# Patient Record
Sex: Male | Born: 1965
Health system: Southern US, Community
[De-identification: ages and names within clinical notes are randomized; demographics above are authoritative.]

## PROBLEM LIST (undated history)

## (undated) ENCOUNTER — Emergency Department (HOSPITAL_COMMUNITY): Admission: EM | Payer: Medicare Other

## (undated) DIAGNOSIS — I428 Other cardiomyopathies: Secondary | ICD-10-CM

## (undated) DIAGNOSIS — I472 Ventricular tachycardia, unspecified: Secondary | ICD-10-CM

## (undated) DIAGNOSIS — K759 Inflammatory liver disease, unspecified: Secondary | ICD-10-CM

## (undated) DIAGNOSIS — Z950 Presence of cardiac pacemaker: Secondary | ICD-10-CM

## (undated) DIAGNOSIS — K219 Gastro-esophageal reflux disease without esophagitis: Secondary | ICD-10-CM

## (undated) DIAGNOSIS — D75A Glucose-6-phosphate dehydrogenase (G6PD) deficiency without anemia: Secondary | ICD-10-CM

## (undated) DIAGNOSIS — J189 Pneumonia, unspecified organism: Secondary | ICD-10-CM

## (undated) DIAGNOSIS — F329 Major depressive disorder, single episode, unspecified: Secondary | ICD-10-CM

## (undated) DIAGNOSIS — Z21 Asymptomatic human immunodeficiency virus [HIV] infection status: Secondary | ICD-10-CM

## (undated) DIAGNOSIS — I5022 Chronic systolic (congestive) heart failure: Secondary | ICD-10-CM

## (undated) DIAGNOSIS — F32A Depression, unspecified: Secondary | ICD-10-CM

## (undated) DIAGNOSIS — Z95810 Presence of automatic (implantable) cardiac defibrillator: Secondary | ICD-10-CM

## (undated) DIAGNOSIS — I10 Essential (primary) hypertension: Secondary | ICD-10-CM

## (undated) DIAGNOSIS — I509 Heart failure, unspecified: Secondary | ICD-10-CM

## (undated) DIAGNOSIS — B20 Human immunodeficiency virus [HIV] disease: Secondary | ICD-10-CM

## (undated) HISTORY — DX: Major depressive disorder, single episode, unspecified: F32.9

## (undated) HISTORY — DX: Asymptomatic human immunodeficiency virus (hiv) infection status: Z21

## (undated) HISTORY — DX: Ventricular tachycardia, unspecified: I47.20

## (undated) HISTORY — DX: Ventricular tachycardia: I47.2

## (undated) HISTORY — DX: Essential (primary) hypertension: I10

## (undated) HISTORY — DX: Chronic systolic (congestive) heart failure: I50.22

## (undated) HISTORY — DX: Human immunodeficiency virus (HIV) disease: B20

## (undated) HISTORY — DX: Other cardiomyopathies: I42.8

## (undated) HISTORY — PX: FINGER SURGERY: SHX640

## (undated) HISTORY — DX: Depression, unspecified: F32.A

---

## 1997-05-01 ENCOUNTER — Encounter (INDEPENDENT_AMBULATORY_CARE_PROVIDER_SITE_OTHER): Payer: Self-pay | Admitting: *Deleted

## 1997-05-01 LAB — CONVERTED CEMR LAB
CD4 Count: 120 microliters
CD4 T Cell Abs: 120

## 1997-11-15 ENCOUNTER — Encounter: Admission: RE | Admit: 1997-11-15 | Discharge: 1997-11-15 | Payer: Self-pay | Admitting: Internal Medicine

## 1998-02-20 ENCOUNTER — Encounter: Admission: RE | Admit: 1998-02-20 | Discharge: 1998-02-20 | Payer: Self-pay | Admitting: Internal Medicine

## 1998-02-25 ENCOUNTER — Encounter: Payer: Self-pay | Admitting: Internal Medicine

## 1998-02-25 ENCOUNTER — Encounter: Admission: RE | Admit: 1998-02-25 | Discharge: 1998-02-25 | Payer: Self-pay | Admitting: Internal Medicine

## 1998-04-21 ENCOUNTER — Emergency Department (HOSPITAL_COMMUNITY): Admission: EM | Admit: 1998-04-21 | Discharge: 1998-04-21 | Payer: Self-pay | Admitting: Emergency Medicine

## 1998-04-21 ENCOUNTER — Encounter: Payer: Self-pay | Admitting: Emergency Medicine

## 1998-05-07 ENCOUNTER — Encounter: Admission: RE | Admit: 1998-05-07 | Discharge: 1998-05-07 | Payer: Self-pay | Admitting: Internal Medicine

## 1998-07-10 ENCOUNTER — Emergency Department (HOSPITAL_COMMUNITY): Admission: EM | Admit: 1998-07-10 | Discharge: 1998-07-10 | Payer: Self-pay | Admitting: Emergency Medicine

## 1998-08-20 ENCOUNTER — Ambulatory Visit (HOSPITAL_COMMUNITY): Admission: RE | Admit: 1998-08-20 | Discharge: 1998-08-20 | Payer: Self-pay | Admitting: Internal Medicine

## 1998-08-20 ENCOUNTER — Encounter: Payer: Self-pay | Admitting: Internal Medicine

## 1998-08-20 ENCOUNTER — Encounter: Admission: RE | Admit: 1998-08-20 | Discharge: 1998-08-20 | Payer: Self-pay | Admitting: Internal Medicine

## 1998-09-17 ENCOUNTER — Encounter: Admission: RE | Admit: 1998-09-17 | Discharge: 1998-09-17 | Payer: Self-pay | Admitting: Internal Medicine

## 1998-10-16 ENCOUNTER — Ambulatory Visit (HOSPITAL_COMMUNITY): Admission: RE | Admit: 1998-10-16 | Discharge: 1998-10-16 | Payer: Self-pay | Admitting: Internal Medicine

## 1998-10-16 ENCOUNTER — Encounter: Admission: RE | Admit: 1998-10-16 | Discharge: 1998-10-16 | Payer: Self-pay

## 1998-12-19 ENCOUNTER — Encounter: Admission: RE | Admit: 1998-12-19 | Discharge: 1998-12-19 | Payer: Self-pay | Admitting: Hematology and Oncology

## 1999-02-18 ENCOUNTER — Ambulatory Visit (HOSPITAL_COMMUNITY): Admission: RE | Admit: 1999-02-18 | Discharge: 1999-02-18 | Payer: Self-pay | Admitting: Hematology and Oncology

## 1999-02-18 ENCOUNTER — Encounter: Admission: RE | Admit: 1999-02-18 | Discharge: 1999-02-18 | Payer: Self-pay | Admitting: Hematology and Oncology

## 1999-03-07 ENCOUNTER — Encounter: Admission: RE | Admit: 1999-03-07 | Discharge: 1999-03-07 | Payer: Self-pay | Admitting: Internal Medicine

## 1999-04-17 ENCOUNTER — Encounter: Admission: RE | Admit: 1999-04-17 | Discharge: 1999-04-17 | Payer: Self-pay | Admitting: Hematology and Oncology

## 1999-05-08 ENCOUNTER — Ambulatory Visit (HOSPITAL_COMMUNITY): Admission: RE | Admit: 1999-05-08 | Discharge: 1999-05-08 | Payer: Self-pay | Admitting: Internal Medicine

## 1999-05-08 ENCOUNTER — Encounter: Admission: RE | Admit: 1999-05-08 | Discharge: 1999-05-08 | Payer: Self-pay | Admitting: Internal Medicine

## 1999-06-12 ENCOUNTER — Encounter: Admission: RE | Admit: 1999-06-12 | Discharge: 1999-06-12 | Payer: Self-pay | Admitting: Internal Medicine

## 1999-08-21 ENCOUNTER — Inpatient Hospital Stay (HOSPITAL_COMMUNITY): Admission: RE | Admit: 1999-08-21 | Discharge: 1999-08-25 | Payer: Self-pay | Admitting: *Deleted

## 1999-09-01 ENCOUNTER — Encounter: Admission: RE | Admit: 1999-09-01 | Discharge: 1999-09-01 | Payer: Self-pay | Admitting: Internal Medicine

## 1999-09-01 ENCOUNTER — Ambulatory Visit (HOSPITAL_COMMUNITY): Admission: RE | Admit: 1999-09-01 | Discharge: 1999-09-01 | Payer: Self-pay | Admitting: Internal Medicine

## 1999-09-15 ENCOUNTER — Encounter: Admission: RE | Admit: 1999-09-15 | Discharge: 1999-09-15 | Payer: Self-pay | Admitting: Internal Medicine

## 1999-09-30 ENCOUNTER — Ambulatory Visit (HOSPITAL_COMMUNITY): Admission: RE | Admit: 1999-09-30 | Discharge: 1999-09-30 | Payer: Self-pay | Admitting: *Deleted

## 1999-09-30 ENCOUNTER — Encounter: Admission: RE | Admit: 1999-09-30 | Discharge: 1999-09-30 | Payer: Self-pay | Admitting: Hematology and Oncology

## 1999-10-30 ENCOUNTER — Encounter: Admission: RE | Admit: 1999-10-30 | Discharge: 1999-10-30 | Payer: Self-pay | Admitting: Internal Medicine

## 1999-12-22 ENCOUNTER — Encounter: Admission: RE | Admit: 1999-12-22 | Discharge: 1999-12-22 | Payer: Self-pay | Admitting: Internal Medicine

## 2000-03-22 ENCOUNTER — Ambulatory Visit (HOSPITAL_COMMUNITY): Admission: RE | Admit: 2000-03-22 | Discharge: 2000-03-22 | Payer: Self-pay | Admitting: Internal Medicine

## 2000-03-22 ENCOUNTER — Encounter: Admission: RE | Admit: 2000-03-22 | Discharge: 2000-03-22 | Payer: Self-pay | Admitting: Internal Medicine

## 2000-04-28 ENCOUNTER — Encounter: Admission: RE | Admit: 2000-04-28 | Discharge: 2000-04-28 | Payer: Self-pay | Admitting: Infectious Diseases

## 2000-05-19 ENCOUNTER — Encounter: Payer: Self-pay | Admitting: Infectious Diseases

## 2000-05-19 ENCOUNTER — Ambulatory Visit (HOSPITAL_COMMUNITY): Admission: RE | Admit: 2000-05-19 | Discharge: 2000-05-19 | Payer: Self-pay | Admitting: Infectious Diseases

## 2000-05-19 ENCOUNTER — Encounter: Admission: RE | Admit: 2000-05-19 | Discharge: 2000-05-19 | Payer: Self-pay | Admitting: Infectious Diseases

## 2000-05-20 ENCOUNTER — Encounter: Admission: RE | Admit: 2000-05-20 | Discharge: 2000-05-20 | Payer: Self-pay | Admitting: Infectious Diseases

## 2000-06-09 ENCOUNTER — Encounter: Admission: RE | Admit: 2000-06-09 | Discharge: 2000-06-09 | Payer: Self-pay | Admitting: Infectious Diseases

## 2000-06-09 ENCOUNTER — Ambulatory Visit (HOSPITAL_COMMUNITY): Admission: RE | Admit: 2000-06-09 | Discharge: 2000-06-09 | Payer: Self-pay | Admitting: Infectious Diseases

## 2000-06-23 ENCOUNTER — Ambulatory Visit (HOSPITAL_COMMUNITY): Admission: RE | Admit: 2000-06-23 | Discharge: 2000-06-23 | Payer: Self-pay

## 2000-06-23 ENCOUNTER — Encounter: Admission: RE | Admit: 2000-06-23 | Discharge: 2000-06-23 | Payer: Self-pay

## 2000-06-24 ENCOUNTER — Ambulatory Visit (HOSPITAL_COMMUNITY): Admission: RE | Admit: 2000-06-24 | Discharge: 2000-06-24 | Payer: Self-pay

## 2000-08-25 ENCOUNTER — Ambulatory Visit (HOSPITAL_COMMUNITY): Admission: RE | Admit: 2000-08-25 | Discharge: 2000-08-25 | Payer: Self-pay | Admitting: Infectious Diseases

## 2000-08-25 ENCOUNTER — Encounter: Admission: RE | Admit: 2000-08-25 | Discharge: 2000-08-25 | Payer: Self-pay | Admitting: Infectious Diseases

## 2000-09-29 ENCOUNTER — Encounter: Admission: RE | Admit: 2000-09-29 | Discharge: 2000-09-29 | Payer: Self-pay | Admitting: Infectious Diseases

## 2001-01-21 ENCOUNTER — Encounter: Admission: RE | Admit: 2001-01-21 | Discharge: 2001-01-21 | Payer: Self-pay | Admitting: Infectious Diseases

## 2001-01-25 ENCOUNTER — Encounter: Admission: RE | Admit: 2001-01-25 | Discharge: 2001-01-25 | Payer: Self-pay | Admitting: Infectious Diseases

## 2001-01-25 ENCOUNTER — Ambulatory Visit (HOSPITAL_COMMUNITY): Admission: RE | Admit: 2001-01-25 | Discharge: 2001-01-25 | Payer: Self-pay | Admitting: Infectious Diseases

## 2001-02-09 ENCOUNTER — Encounter: Admission: RE | Admit: 2001-02-09 | Discharge: 2001-02-09 | Payer: Self-pay | Admitting: Infectious Diseases

## 2001-03-14 ENCOUNTER — Encounter: Admission: RE | Admit: 2001-03-14 | Discharge: 2001-03-14 | Payer: Self-pay | Admitting: Internal Medicine

## 2001-04-18 ENCOUNTER — Encounter: Admission: RE | Admit: 2001-04-18 | Discharge: 2001-04-18 | Payer: Self-pay | Admitting: Infectious Diseases

## 2001-05-25 ENCOUNTER — Encounter: Payer: Self-pay | Admitting: *Deleted

## 2001-05-26 ENCOUNTER — Inpatient Hospital Stay (HOSPITAL_COMMUNITY): Admission: EM | Admit: 2001-05-26 | Discharge: 2001-05-28 | Payer: Self-pay | Admitting: *Deleted

## 2001-06-28 ENCOUNTER — Ambulatory Visit (HOSPITAL_COMMUNITY): Admission: RE | Admit: 2001-06-28 | Discharge: 2001-06-28 | Payer: Self-pay | Admitting: Infectious Diseases

## 2001-06-28 ENCOUNTER — Encounter: Admission: RE | Admit: 2001-06-28 | Discharge: 2001-06-28 | Payer: Self-pay | Admitting: Infectious Diseases

## 2001-09-07 ENCOUNTER — Encounter: Admission: RE | Admit: 2001-09-07 | Discharge: 2001-09-07 | Payer: Self-pay | Admitting: Infectious Diseases

## 2002-01-13 ENCOUNTER — Ambulatory Visit (HOSPITAL_COMMUNITY): Admission: RE | Admit: 2002-01-13 | Discharge: 2002-01-13 | Payer: Self-pay | Admitting: Infectious Diseases

## 2002-01-13 ENCOUNTER — Encounter: Admission: RE | Admit: 2002-01-13 | Discharge: 2002-01-13 | Payer: Self-pay | Admitting: Infectious Diseases

## 2002-01-17 ENCOUNTER — Ambulatory Visit (HOSPITAL_COMMUNITY): Admission: RE | Admit: 2002-01-17 | Discharge: 2002-01-17 | Payer: Self-pay | Admitting: Infectious Diseases

## 2002-01-17 ENCOUNTER — Encounter: Payer: Self-pay | Admitting: Infectious Diseases

## 2002-02-02 ENCOUNTER — Encounter: Admission: RE | Admit: 2002-02-02 | Discharge: 2002-02-02 | Payer: Self-pay | Admitting: Internal Medicine

## 2002-03-07 ENCOUNTER — Encounter: Admission: RE | Admit: 2002-03-07 | Discharge: 2002-03-07 | Payer: Self-pay | Admitting: Infectious Diseases

## 2002-03-29 ENCOUNTER — Encounter: Admission: RE | Admit: 2002-03-29 | Discharge: 2002-03-29 | Payer: Self-pay | Admitting: Infectious Diseases

## 2002-04-10 ENCOUNTER — Encounter: Admission: RE | Admit: 2002-04-10 | Discharge: 2002-04-10 | Payer: Self-pay | Admitting: Infectious Diseases

## 2002-08-22 ENCOUNTER — Encounter: Admission: RE | Admit: 2002-08-22 | Discharge: 2002-08-22 | Payer: Self-pay | Admitting: Internal Medicine

## 2002-08-24 ENCOUNTER — Encounter: Admission: RE | Admit: 2002-08-24 | Discharge: 2002-08-24 | Payer: Self-pay | Admitting: Infectious Diseases

## 2002-09-20 ENCOUNTER — Encounter: Admission: RE | Admit: 2002-09-20 | Discharge: 2002-09-20 | Payer: Self-pay | Admitting: Infectious Diseases

## 2003-03-14 ENCOUNTER — Encounter: Admission: RE | Admit: 2003-03-14 | Discharge: 2003-03-14 | Payer: Self-pay | Admitting: Infectious Diseases

## 2003-03-14 ENCOUNTER — Encounter: Payer: Self-pay | Admitting: Infectious Diseases

## 2003-03-14 ENCOUNTER — Ambulatory Visit (HOSPITAL_COMMUNITY): Admission: RE | Admit: 2003-03-14 | Discharge: 2003-03-14 | Payer: Self-pay | Admitting: Infectious Diseases

## 2003-03-28 ENCOUNTER — Encounter: Admission: RE | Admit: 2003-03-28 | Discharge: 2003-03-28 | Payer: Self-pay | Admitting: Infectious Diseases

## 2003-07-06 ENCOUNTER — Inpatient Hospital Stay (HOSPITAL_COMMUNITY): Admission: EM | Admit: 2003-07-06 | Discharge: 2003-07-07 | Payer: Self-pay | Admitting: Emergency Medicine

## 2003-07-06 ENCOUNTER — Encounter: Payer: Self-pay | Admitting: Cardiology

## 2003-07-11 ENCOUNTER — Encounter: Admission: RE | Admit: 2003-07-11 | Discharge: 2003-07-11 | Payer: Self-pay | Admitting: Infectious Diseases

## 2003-07-23 ENCOUNTER — Ambulatory Visit (HOSPITAL_COMMUNITY): Admission: RE | Admit: 2003-07-23 | Discharge: 2003-07-23 | Payer: Self-pay | Admitting: Infectious Diseases

## 2003-11-28 ENCOUNTER — Ambulatory Visit (HOSPITAL_COMMUNITY): Admission: RE | Admit: 2003-11-28 | Discharge: 2003-11-28 | Payer: Self-pay | Admitting: Infectious Diseases

## 2003-11-28 ENCOUNTER — Encounter: Admission: RE | Admit: 2003-11-28 | Discharge: 2003-11-28 | Payer: Self-pay | Admitting: Infectious Diseases

## 2003-12-19 ENCOUNTER — Encounter: Admission: RE | Admit: 2003-12-19 | Discharge: 2003-12-19 | Payer: Self-pay | Admitting: Infectious Diseases

## 2004-07-16 ENCOUNTER — Ambulatory Visit: Payer: Self-pay | Admitting: Infectious Diseases

## 2004-07-16 ENCOUNTER — Ambulatory Visit (HOSPITAL_COMMUNITY): Admission: RE | Admit: 2004-07-16 | Discharge: 2004-07-16 | Payer: Self-pay | Admitting: Infectious Diseases

## 2004-08-27 ENCOUNTER — Ambulatory Visit: Payer: Self-pay | Admitting: Infectious Diseases

## 2004-10-15 ENCOUNTER — Ambulatory Visit: Payer: Self-pay | Admitting: Infectious Diseases

## 2004-10-15 ENCOUNTER — Ambulatory Visit (HOSPITAL_COMMUNITY): Admission: RE | Admit: 2004-10-15 | Discharge: 2004-10-15 | Payer: Self-pay | Admitting: Infectious Diseases

## 2004-10-29 ENCOUNTER — Ambulatory Visit: Payer: Self-pay | Admitting: Infectious Diseases

## 2005-01-07 ENCOUNTER — Encounter (INDEPENDENT_AMBULATORY_CARE_PROVIDER_SITE_OTHER): Payer: Self-pay | Admitting: *Deleted

## 2005-01-07 ENCOUNTER — Ambulatory Visit (HOSPITAL_COMMUNITY): Admission: RE | Admit: 2005-01-07 | Discharge: 2005-01-07 | Payer: Self-pay | Admitting: Infectious Diseases

## 2005-01-07 ENCOUNTER — Ambulatory Visit: Payer: Self-pay | Admitting: Infectious Diseases

## 2005-01-07 LAB — CONVERTED CEMR LAB
CD4 Count: 220 microliters
HIV 1 RNA Quant: 161000 copies/mL

## 2005-09-16 ENCOUNTER — Ambulatory Visit: Payer: Self-pay | Admitting: Infectious Diseases

## 2005-09-16 ENCOUNTER — Encounter (INDEPENDENT_AMBULATORY_CARE_PROVIDER_SITE_OTHER): Payer: Self-pay | Admitting: *Deleted

## 2005-09-16 ENCOUNTER — Encounter: Admission: RE | Admit: 2005-09-16 | Discharge: 2005-09-16 | Payer: Self-pay | Admitting: Infectious Diseases

## 2005-09-16 LAB — CONVERTED CEMR LAB
CD4 Count: 230 microliters
HIV 1 RNA Quant: 46600 copies/mL

## 2006-01-18 ENCOUNTER — Ambulatory Visit: Payer: Self-pay | Admitting: Infectious Diseases

## 2006-01-18 ENCOUNTER — Encounter (INDEPENDENT_AMBULATORY_CARE_PROVIDER_SITE_OTHER): Payer: Self-pay | Admitting: *Deleted

## 2006-01-18 ENCOUNTER — Encounter: Admission: RE | Admit: 2006-01-18 | Discharge: 2006-01-18 | Payer: Self-pay | Admitting: Infectious Diseases

## 2006-01-18 LAB — CONVERTED CEMR LAB
CD4 Count: 180 microliters
HIV 1 RNA Quant: 4360 copies/mL

## 2006-02-03 ENCOUNTER — Ambulatory Visit: Payer: Self-pay | Admitting: Internal Medicine

## 2006-02-17 ENCOUNTER — Ambulatory Visit (HOSPITAL_COMMUNITY): Admission: RE | Admit: 2006-02-17 | Discharge: 2006-02-17 | Payer: Self-pay | Admitting: Hospitalist

## 2006-02-17 ENCOUNTER — Ambulatory Visit: Payer: Self-pay | Admitting: Hospitalist

## 2006-02-17 ENCOUNTER — Encounter: Payer: Self-pay | Admitting: Infectious Diseases

## 2006-06-02 ENCOUNTER — Encounter: Payer: Self-pay | Admitting: Infectious Diseases

## 2006-06-02 ENCOUNTER — Ambulatory Visit: Payer: Self-pay | Admitting: Internal Medicine

## 2006-06-09 DIAGNOSIS — B182 Chronic viral hepatitis C: Secondary | ICD-10-CM | POA: Insufficient documentation

## 2006-06-09 DIAGNOSIS — I10 Essential (primary) hypertension: Secondary | ICD-10-CM | POA: Insufficient documentation

## 2006-06-09 DIAGNOSIS — B59 Pneumocystosis: Secondary | ICD-10-CM | POA: Insufficient documentation

## 2006-06-09 DIAGNOSIS — F1910 Other psychoactive substance abuse, uncomplicated: Secondary | ICD-10-CM | POA: Insufficient documentation

## 2006-06-09 DIAGNOSIS — B20 Human immunodeficiency virus [HIV] disease: Secondary | ICD-10-CM | POA: Insufficient documentation

## 2006-06-09 DIAGNOSIS — A6000 Herpesviral infection of urogenital system, unspecified: Secondary | ICD-10-CM | POA: Insufficient documentation

## 2006-06-09 DIAGNOSIS — Z8619 Personal history of other infectious and parasitic diseases: Secondary | ICD-10-CM | POA: Insufficient documentation

## 2006-06-24 DIAGNOSIS — K625 Hemorrhage of anus and rectum: Secondary | ICD-10-CM | POA: Insufficient documentation

## 2006-06-28 ENCOUNTER — Encounter (INDEPENDENT_AMBULATORY_CARE_PROVIDER_SITE_OTHER): Payer: Self-pay | Admitting: Internal Medicine

## 2006-06-28 ENCOUNTER — Ambulatory Visit: Payer: Self-pay | Admitting: Internal Medicine

## 2006-06-28 ENCOUNTER — Telehealth (INDEPENDENT_AMBULATORY_CARE_PROVIDER_SITE_OTHER): Payer: Self-pay | Admitting: *Deleted

## 2006-06-28 LAB — CONVERTED CEMR LAB
HCT: 41.2 % (ref 39.0–52.0)
Hemoglobin: 14.1 g/dL (ref 13.0–17.0)
INR: 0.6
MCHC: 34.2 g/dL (ref 30.0–36.0)
MCV: 84.9 fL (ref 78.0–100.0)
OCCULT 1: POSITIVE
Platelets: 273 10*3/uL (ref 150–400)
RBC: 4.85 M/uL (ref 4.22–5.81)
RDW: 12.3 % (ref 11.5–14.0)
WBC: 6.1 10*3/uL (ref 4.0–10.5)

## 2006-07-26 ENCOUNTER — Encounter: Payer: Self-pay | Admitting: Internal Medicine

## 2006-07-26 ENCOUNTER — Encounter (INDEPENDENT_AMBULATORY_CARE_PROVIDER_SITE_OTHER): Payer: Self-pay | Admitting: *Deleted

## 2006-07-26 ENCOUNTER — Emergency Department (HOSPITAL_COMMUNITY): Admission: EM | Admit: 2006-07-26 | Discharge: 2006-07-26 | Payer: Self-pay | Admitting: Emergency Medicine

## 2006-07-26 LAB — CONVERTED CEMR LAB: HCV Quantitative: 250000 intl units/mL

## 2006-07-28 ENCOUNTER — Telehealth: Payer: Self-pay | Admitting: Infectious Diseases

## 2006-07-30 ENCOUNTER — Encounter: Payer: Self-pay | Admitting: Infectious Diseases

## 2006-07-30 ENCOUNTER — Encounter: Admission: RE | Admit: 2006-07-30 | Discharge: 2006-07-30 | Payer: Self-pay | Admitting: Internal Medicine

## 2006-07-30 ENCOUNTER — Ambulatory Visit: Payer: Self-pay | Admitting: Internal Medicine

## 2006-07-30 LAB — CONVERTED CEMR LAB
ALT: 39 units/L (ref 0–53)
AST: 43 units/L — ABNORMAL HIGH (ref 0–37)
Albumin: 4.4 g/dL (ref 3.5–5.2)
Alkaline Phosphatase: 104 units/L (ref 39–117)
BUN: 12 mg/dL (ref 6–23)
Basophils Absolute: 0 10*3/uL (ref 0.0–0.1)
Basophils Relative: 1 % (ref 0–1)
CD4 % Helper T Cell: 13 %
CD4 % Helper T Cell: 13 %
CD4 Count: 330 microliters
CD4 Count: 330 microliters
CO2: 28 meq/L (ref 19–32)
Calcium: 9.3 mg/dL (ref 8.4–10.5)
Chloride: 99 meq/L (ref 96–112)
Creatinine, Ser: 0.93 mg/dL (ref 0.40–1.50)
Eosinophils Absolute: 0.2 10*3/uL (ref 0.0–0.7)
Eosinophils Relative: 3 % (ref 0–5)
Glucose, Bld: 78 mg/dL (ref 70–99)
HCT: 45.9 % (ref 39.0–52.0)
HIV 1 RNA Quant: <50 copies/mL (ref <50)
HIV-1 RNA Quant, Log: <1.7 (ref <1.70)
Hemoglobin: 14.7 g/dL (ref 13.0–17.0)
Lymphocytes Relative: 49 % — ABNORMAL HIGH (ref 12–46)
Lymphs Abs: 2.7 10*3/uL (ref 0.7–3.3)
MCHC: 32 g/dL (ref 30.0–36.0)
MCV: 87.6 fL (ref 78.0–100.0)
Monocytes Absolute: 0.6 10*3/uL (ref 0.2–0.7)
Monocytes Relative: 10 % (ref 3–11)
Neutro Abs: 2.1 10*3/uL (ref 1.7–7.7)
Neutrophils Relative %: 38 % — ABNORMAL LOW (ref 43–77)
Platelets: 293 10*3/uL (ref 150–400)
Potassium: 3.9 meq/L (ref 3.5–5.3)
RBC: 5.24 M/uL (ref 4.22–5.81)
RDW: 12.9 % (ref 11.5–14.0)
Sodium: 141 meq/L (ref 135–145)
Total Bilirubin: 0.5 mg/dL (ref 0.3–1.2)
Total Protein: 8.8 g/dL — ABNORMAL HIGH (ref 6.0–8.3)
WBC: 5.5 10*3/uL (ref 4.0–10.5)

## 2006-08-08 ENCOUNTER — Encounter (INDEPENDENT_AMBULATORY_CARE_PROVIDER_SITE_OTHER): Payer: Self-pay | Admitting: *Deleted

## 2006-08-13 ENCOUNTER — Ambulatory Visit: Payer: Self-pay | Admitting: Internal Medicine

## 2007-02-14 ENCOUNTER — Ambulatory Visit: Payer: Self-pay | Admitting: Internal Medicine

## 2007-02-14 ENCOUNTER — Encounter: Admission: RE | Admit: 2007-02-14 | Discharge: 2007-02-14 | Payer: Self-pay | Admitting: Internal Medicine

## 2007-02-14 LAB — CONVERTED CEMR LAB
ALT: 84 units/L — ABNORMAL HIGH (ref 0–53)
AST: 76 units/L — ABNORMAL HIGH (ref 0–37)
Albumin: 4.5 g/dL (ref 3.5–5.2)
Alkaline Phosphatase: 99 units/L (ref 39–117)
BUN: 13 mg/dL (ref 6–23)
Bacteria, UA: NONE SEEN
Basophils Absolute: 0 10*3/uL (ref 0.0–0.1)
Basophils Relative: 0 % (ref 0–1)
Bilirubin Urine: NEGATIVE
CO2: 26 meq/L (ref 19–32)
Calcium: 9.9 mg/dL (ref 8.4–10.5)
Chloride: 106 meq/L (ref 96–112)
Cholesterol: 194 mg/dL (ref 0–200)
Creatinine, Ser: 1.09 mg/dL (ref 0.40–1.50)
Eosinophils Absolute: 0.2 10*3/uL (ref 0.0–0.7)
Eosinophils Relative: 3 % (ref 0–5)
Glucose, Bld: 78 mg/dL (ref 70–99)
HCT: 45.4 % (ref 39.0–52.0)
HDL: 67 mg/dL (ref >39)
Hemoglobin, Urine: NEGATIVE
Hemoglobin: 15.4 g/dL (ref 13.0–17.0)
Ketones, ur: NEGATIVE mg/dL
LDL Cholesterol: 75 mg/dL (ref 0–99)
Leukocytes, UA: NEGATIVE
Lymphocytes Relative: 35 % (ref 12–46)
Lymphs Abs: 2.4 10*3/uL (ref 0.7–3.3)
MCHC: 33.9 g/dL (ref 30.0–36.0)
MCV: 87 fL (ref 78.0–100.0)
Monocytes Absolute: 0.7 10*3/uL (ref 0.2–0.7)
Monocytes Relative: 10 % (ref 3–11)
Neutro Abs: 3.6 10*3/uL (ref 1.7–7.7)
Neutrophils Relative %: 52 % (ref 43–77)
Nitrite: NEGATIVE
Platelets: 272 10*3/uL (ref 150–400)
Potassium: 4.3 meq/L (ref 3.5–5.3)
Protein, ur: 100 mg/dL — AB
RBC / HPF: NONE SEEN (ref <3)
RBC: 5.22 M/uL (ref 4.22–5.81)
RDW: 12.8 % (ref 11.5–14.0)
Sodium: 143 meq/L (ref 135–145)
Specific Gravity, Urine: 1.026 (ref 1.005–1.03)
Total Bilirubin: 0.5 mg/dL (ref 0.3–1.2)
Total CHOL/HDL Ratio: 2.9
Total Protein: 8.2 g/dL (ref 6.0–8.3)
Triglycerides: 259 mg/dL — ABNORMAL HIGH (ref <150)
Urine Glucose: NEGATIVE mg/dL
Urobilinogen, UA: 0.2 (ref 0.0–1.0)
VLDL: 52 mg/dL — ABNORMAL HIGH (ref 0–40)
WBC, UA: NONE SEEN cells/hpf (ref <3)
WBC: 6.9 10*3/uL (ref 4.0–10.5)
pH: 6 (ref 5.0–8.0)

## 2007-03-09 ENCOUNTER — Ambulatory Visit: Payer: Self-pay | Admitting: Internal Medicine

## 2007-07-15 ENCOUNTER — Telehealth: Payer: Self-pay | Admitting: Internal Medicine

## 2007-09-21 ENCOUNTER — Ambulatory Visit: Payer: Self-pay | Admitting: Internal Medicine

## 2007-09-21 DIAGNOSIS — J309 Allergic rhinitis, unspecified: Secondary | ICD-10-CM | POA: Insufficient documentation

## 2007-09-21 DIAGNOSIS — R109 Unspecified abdominal pain: Secondary | ICD-10-CM | POA: Insufficient documentation

## 2007-09-21 LAB — CONVERTED CEMR LAB
Blood in Urine, dipstick: NEGATIVE
Nitrite: NEGATIVE
Urobilinogen, UA: NEGATIVE
WBC Urine, dipstick: NEGATIVE

## 2007-11-07 ENCOUNTER — Emergency Department (HOSPITAL_COMMUNITY): Admission: EM | Admit: 2007-11-07 | Discharge: 2007-11-07 | Payer: Self-pay | Admitting: Emergency Medicine

## 2007-11-07 DIAGNOSIS — J029 Acute pharyngitis, unspecified: Secondary | ICD-10-CM | POA: Insufficient documentation

## 2007-11-09 ENCOUNTER — Telehealth (INDEPENDENT_AMBULATORY_CARE_PROVIDER_SITE_OTHER): Payer: Self-pay | Admitting: Internal Medicine

## 2007-11-09 ENCOUNTER — Emergency Department (HOSPITAL_COMMUNITY): Admission: EM | Admit: 2007-11-09 | Discharge: 2007-11-09 | Payer: Self-pay | Admitting: Emergency Medicine

## 2007-11-11 ENCOUNTER — Ambulatory Visit: Payer: Self-pay | Admitting: Internal Medicine

## 2007-11-11 DIAGNOSIS — K921 Melena: Secondary | ICD-10-CM | POA: Insufficient documentation

## 2008-03-20 ENCOUNTER — Ambulatory Visit: Payer: Self-pay | Admitting: Internal Medicine

## 2008-03-20 LAB — CONVERTED CEMR LAB
Alkaline Phosphatase: 82 units/L (ref 39–117)
Eosinophils Absolute: 0.1 10*3/uL (ref 0.0–0.7)
Eosinophils Relative: 2 % (ref 0–5)
Glucose, Bld: 94 mg/dL (ref 70–99)
HCT: 44.7 % (ref 39.0–52.0)
HIV-1 RNA Quant, Log: 3.22 — ABNORMAL HIGH (ref <1.70)
Lymphs Abs: 1.7 10*3/uL (ref 0.7–4.0)
MCHC: 32.2 g/dL (ref 30.0–36.0)
MCV: 83.1 fL (ref 78.0–100.0)
Platelets: 266 10*3/uL (ref 150–400)
RDW: 14.6 % (ref 11.5–15.5)
Sodium: 142 meq/L (ref 135–145)
Total Bilirubin: 0.4 mg/dL (ref 0.3–1.2)
Total Protein: 8.4 g/dL — ABNORMAL HIGH (ref 6.0–8.3)

## 2008-04-04 ENCOUNTER — Ambulatory Visit: Payer: Self-pay | Admitting: Internal Medicine

## 2008-04-04 LAB — CONVERTED CEMR LAB

## 2008-05-04 ENCOUNTER — Ambulatory Visit: Payer: Self-pay | Admitting: Internal Medicine

## 2008-05-04 DIAGNOSIS — K219 Gastro-esophageal reflux disease without esophagitis: Secondary | ICD-10-CM | POA: Insufficient documentation

## 2008-06-14 ENCOUNTER — Ambulatory Visit: Payer: Self-pay | Admitting: Internal Medicine

## 2008-06-14 LAB — CONVERTED CEMR LAB
BUN: 10 mg/dL (ref 6–23)
CO2: 22 meq/L (ref 19–32)
Calcium: 9.5 mg/dL (ref 8.4–10.5)
Chloride: 109 meq/L (ref 96–112)
Cholesterol: 175 mg/dL (ref 0–200)
Creatinine, Ser: 1.07 mg/dL (ref 0.40–1.50)
Glucose, Bld: 84 mg/dL (ref 70–99)
HDL: 75 mg/dL (ref >39)
Hemoglobin: 14.7 g/dL (ref 13.0–17.0)
Lymphocytes Relative: 25 % (ref 12–46)
Lymphs Abs: 1.5 10*3/uL (ref 0.7–4.0)
MCHC: 32.3 g/dL (ref 30.0–36.0)
Monocytes Absolute: 0.5 10*3/uL (ref 0.1–1.0)
Monocytes Relative: 9 % (ref 3–12)
Neutro Abs: 3.9 10*3/uL (ref 1.7–7.7)
RBC: 5.1 M/uL (ref 4.22–5.81)
Total CHOL/HDL Ratio: 2.3

## 2008-06-29 ENCOUNTER — Ambulatory Visit: Payer: Self-pay | Admitting: Internal Medicine

## 2008-06-29 DIAGNOSIS — J209 Acute bronchitis, unspecified: Secondary | ICD-10-CM | POA: Insufficient documentation

## 2008-08-27 ENCOUNTER — Telehealth: Payer: Self-pay | Admitting: Internal Medicine

## 2008-10-12 ENCOUNTER — Encounter: Payer: Self-pay | Admitting: Internal Medicine

## 2008-10-12 ENCOUNTER — Ambulatory Visit: Payer: Self-pay | Admitting: Internal Medicine

## 2008-10-15 ENCOUNTER — Encounter: Payer: Self-pay | Admitting: Internal Medicine

## 2008-10-15 LAB — CONVERTED CEMR LAB
AST: 320 units/L — ABNORMAL HIGH (ref 0–37)
Albumin: 4.5 g/dL (ref 3.5–5.2)
BUN: 13 mg/dL (ref 6–23)
Basophils Absolute: 0 10*3/uL (ref 0.0–0.1)
Basophils Relative: 1 % (ref 0–1)
Calcium: 9.8 mg/dL (ref 8.4–10.5)
Chloride: 109 meq/L (ref 96–112)
Eosinophils Absolute: 0.1 10*3/uL (ref 0.0–0.7)
Eosinophils Relative: 2 % (ref 0–5)
GFR calc non Af Amer: >60 mL/min (ref >60)
Glucose, Bld: 84 mg/dL (ref 70–99)
Lymphs Abs: 1.7 10*3/uL (ref 0.7–4.0)
MCV: 87.1 fL (ref 78.0–100.0)
Neutrophils Relative %: 56 % (ref 43–77)
Platelets: 263 10*3/uL (ref 150–400)
Potassium: 3.9 meq/L (ref 3.5–5.3)
RDW: 13.8 % (ref 11.5–15.5)
Sodium: 145 meq/L (ref 135–145)
Total Protein: 8.4 g/dL — ABNORMAL HIGH (ref 6.0–8.3)
WBC: 5.6 10*3/uL (ref 4.0–10.5)

## 2008-10-18 ENCOUNTER — Ambulatory Visit (HOSPITAL_COMMUNITY): Admission: RE | Admit: 2008-10-18 | Discharge: 2008-10-18 | Payer: Self-pay | Admitting: Internal Medicine

## 2008-10-19 DIAGNOSIS — Q638 Other specified congenital malformations of kidney: Secondary | ICD-10-CM | POA: Insufficient documentation

## 2008-10-24 ENCOUNTER — Ambulatory Visit (HOSPITAL_COMMUNITY): Admission: RE | Admit: 2008-10-24 | Discharge: 2008-10-24 | Payer: Self-pay | Admitting: Internal Medicine

## 2008-11-07 ENCOUNTER — Ambulatory Visit: Payer: Self-pay | Admitting: Internal Medicine

## 2009-02-05 ENCOUNTER — Ambulatory Visit: Payer: Self-pay | Admitting: Internal Medicine

## 2009-02-05 LAB — CONVERTED CEMR LAB
Alkaline Phosphatase: 77 units/L (ref 39–117)
BUN: 13 mg/dL (ref 6–23)
Basophils Relative: 0 % (ref 0–1)
CO2: 24 meq/L (ref 19–32)
Creatinine, Ser: 1.08 mg/dL (ref 0.40–1.50)
Eosinophils Absolute: 0.4 10*3/uL (ref 0.0–0.7)
Glucose, Bld: 95 mg/dL (ref 70–99)
HCT: 45.3 % (ref 39.0–52.0)
Hemoglobin: 14.7 g/dL (ref 13.0–17.0)
Lymphs Abs: 2.2 10*3/uL (ref 0.7–4.0)
MCHC: 32.5 g/dL (ref 30.0–36.0)
MCV: 89.2 fL (ref >=78.0)
Monocytes Absolute: 0.7 10*3/uL (ref 0.1–1.0)
Monocytes Relative: 8 % (ref 3–12)
RBC: 5.08 M/uL (ref 4.22–5.81)
Sodium: 141 meq/L (ref 135–145)
Total Bilirubin: 0.6 mg/dL (ref 0.3–1.2)
WBC: 8.1 10*3/uL (ref 4.0–10.5)

## 2009-02-20 ENCOUNTER — Ambulatory Visit: Payer: Self-pay | Admitting: Internal Medicine

## 2009-02-20 DIAGNOSIS — B351 Tinea unguium: Secondary | ICD-10-CM | POA: Insufficient documentation

## 2009-02-20 DIAGNOSIS — F322 Major depressive disorder, single episode, severe without psychotic features: Secondary | ICD-10-CM | POA: Insufficient documentation

## 2009-02-20 LAB — CONVERTED CEMR LAB

## 2009-03-08 ENCOUNTER — Encounter: Payer: Self-pay | Admitting: Internal Medicine

## 2009-03-13 ENCOUNTER — Ambulatory Visit: Payer: Self-pay | Admitting: Internal Medicine

## 2009-05-29 ENCOUNTER — Ambulatory Visit: Payer: Self-pay | Admitting: Internal Medicine

## 2009-05-29 LAB — CONVERTED CEMR LAB
ALT: 70 units/L — ABNORMAL HIGH (ref 0–53)
AST: 77 units/L — ABNORMAL HIGH (ref 0–37)
Albumin: 4.4 g/dL (ref 3.5–5.2)
CO2: 23 meq/L (ref 19–32)
Calcium: 9 mg/dL (ref 8.4–10.5)
Chloride: 104 meq/L (ref 96–112)
Eosinophils Absolute: 0.1 10*3/uL (ref 0.0–0.7)
Eosinophils Relative: 2 % (ref 0–5)
HCT: 43.6 % (ref 39.0–52.0)
HIV 1 RNA Quant: 1760 copies/mL — ABNORMAL HIGH (ref <48)
Lymphocytes Relative: 34 % (ref 12–46)
Lymphs Abs: 2.2 10*3/uL (ref 0.7–4.0)
MCV: 85.3 fL (ref >=78.0)
Monocytes Relative: 12 % (ref 3–12)
Neutrophils Relative %: 52 % (ref 43–77)
Platelets: 295 10*3/uL (ref 150–400)
Potassium: 3.9 meq/L (ref 3.5–5.3)
RBC: 5.11 M/uL (ref 4.22–5.81)
Total Protein: 7.8 g/dL (ref 6.0–8.3)
WBC: 6.4 10*3/uL (ref 4.0–10.5)

## 2009-06-14 ENCOUNTER — Ambulatory Visit: Payer: Self-pay | Admitting: Internal Medicine

## 2009-07-01 ENCOUNTER — Telehealth: Payer: Self-pay | Admitting: Internal Medicine

## 2009-08-26 ENCOUNTER — Ambulatory Visit: Payer: Self-pay | Admitting: Internal Medicine

## 2009-08-26 LAB — CONVERTED CEMR LAB
ALT: 84 units/L — ABNORMAL HIGH (ref 0–53)
Alkaline Phosphatase: 86 units/L (ref 39–117)
Basophils Absolute: 0 10*3/uL (ref 0.0–0.1)
Basophils Relative: 0 % (ref 0–1)
CO2: 25 meq/L (ref 19–32)
Cholesterol: 154 mg/dL (ref 0–200)
Creatinine, Ser: 1.06 mg/dL (ref 0.40–1.50)
Eosinophils Absolute: 0.2 10*3/uL (ref 0.0–0.7)
HIV 1 RNA Quant: 26600 copies/mL — ABNORMAL HIGH (ref <48)
LDL Cholesterol: 84 mg/dL (ref 0–99)
MCHC: 33.1 g/dL (ref 30.0–36.0)
MCV: 86.5 fL (ref >=78.0)
Monocytes Relative: 8 % (ref 3–12)
Neutro Abs: 5.1 10*3/uL (ref 1.7–7.7)
Neutrophils Relative %: 71 % (ref 43–77)
Platelets: 283 10*3/uL (ref 150–400)
RDW: 12.8 % (ref 11.5–15.5)
Total Bilirubin: 0.4 mg/dL (ref 0.3–1.2)
Total CHOL/HDL Ratio: 3.1
VLDL: 21 mg/dL (ref 0–40)

## 2009-09-27 ENCOUNTER — Ambulatory Visit: Payer: Self-pay | Admitting: Internal Medicine

## 2009-10-04 ENCOUNTER — Encounter: Payer: Self-pay | Admitting: Internal Medicine

## 2009-10-18 ENCOUNTER — Ambulatory Visit: Payer: Self-pay | Admitting: Internal Medicine

## 2009-11-22 ENCOUNTER — Ambulatory Visit: Payer: Self-pay | Admitting: Internal Medicine

## 2009-11-22 LAB — CONVERTED CEMR LAB
ALT: 90 units/L — ABNORMAL HIGH (ref 0–53)
AST: 89 units/L — ABNORMAL HIGH (ref 0–37)
Albumin: 4.6 g/dL (ref 3.5–5.2)
CO2: 23 meq/L (ref 19–32)
Calcium: 9.7 mg/dL (ref 8.4–10.5)
Chloride: 106 meq/L (ref 96–112)
Creatinine, Ser: 1.01 mg/dL (ref 0.40–1.50)
Eosinophils Absolute: 0.2 10*3/uL (ref 0.0–0.7)
HCT: 45.9 % (ref 39.0–52.0)
HIV 1 RNA Quant: 8630 copies/mL — ABNORMAL HIGH (ref <48)
Hemoglobin: 15.2 g/dL (ref 13.0–17.0)
Lymphocytes Relative: 31 % (ref 12–46)
Lymphs Abs: 1.9 10*3/uL (ref 0.7–4.0)
MCHC: 33.1 g/dL (ref 30.0–36.0)
Monocytes Relative: 11 % (ref 3–12)
Potassium: 4.3 meq/L (ref 3.5–5.3)
RDW: 13.1 % (ref 11.5–15.5)
Sodium: 140 meq/L (ref 135–145)
Total Protein: 8.5 g/dL — ABNORMAL HIGH (ref 6.0–8.3)

## 2009-12-04 ENCOUNTER — Emergency Department (HOSPITAL_COMMUNITY): Admission: EM | Admit: 2009-12-04 | Discharge: 2009-12-04 | Payer: Self-pay | Admitting: Emergency Medicine

## 2009-12-12 ENCOUNTER — Ambulatory Visit: Payer: Self-pay | Admitting: Internal Medicine

## 2010-02-12 ENCOUNTER — Telehealth (INDEPENDENT_AMBULATORY_CARE_PROVIDER_SITE_OTHER): Payer: Self-pay | Admitting: *Deleted

## 2010-03-27 ENCOUNTER — Ambulatory Visit: Payer: Self-pay | Admitting: Internal Medicine

## 2010-03-27 LAB — CONVERTED CEMR LAB
ALT: 61 units/L — ABNORMAL HIGH (ref 0–53)
AST: 65 units/L — ABNORMAL HIGH (ref 0–37)
Albumin: 4.1 g/dL (ref 3.5–5.2)
Alkaline Phosphatase: 63 units/L (ref 39–117)
BUN: 11 mg/dL (ref 6–23)
Basophils Absolute: 0 10*3/uL (ref 0.0–0.1)
Basophils Relative: 1 % (ref 0–1)
Chloride: 103 meq/L (ref 96–112)
Creatinine, Ser: 1.1 mg/dL (ref 0.40–1.50)
Eosinophils Absolute: 0.1 10*3/uL (ref 0.0–0.7)
MCHC: 34.1 g/dL (ref 30.0–36.0)
MCV: 86.5 fL (ref 78.0–100.0)
Monocytes Relative: 9 % (ref 3–12)
Neutro Abs: 3.7 10*3/uL (ref 1.7–7.7)
Neutrophils Relative %: 56 % (ref 43–77)
Potassium: 4.1 meq/L (ref 3.5–5.3)
RBC: 4.75 M/uL (ref 4.22–5.81)
RDW: 12.3 % (ref 11.5–15.5)

## 2010-04-21 ENCOUNTER — Encounter (INDEPENDENT_AMBULATORY_CARE_PROVIDER_SITE_OTHER): Payer: Self-pay | Admitting: *Deleted

## 2010-04-23 ENCOUNTER — Ambulatory Visit: Payer: Self-pay | Admitting: Internal Medicine

## 2010-04-23 DIAGNOSIS — R21 Rash and other nonspecific skin eruption: Secondary | ICD-10-CM | POA: Insufficient documentation

## 2010-04-28 ENCOUNTER — Telehealth: Payer: Self-pay | Admitting: Internal Medicine

## 2010-06-03 ENCOUNTER — Encounter: Payer: Self-pay | Admitting: Internal Medicine

## 2010-06-03 ENCOUNTER — Ambulatory Visit
Admission: RE | Admit: 2010-06-03 | Discharge: 2010-06-03 | Payer: Self-pay | Source: Home / Self Care | Attending: Internal Medicine | Admitting: Internal Medicine

## 2010-06-03 LAB — CONVERTED CEMR LAB
ALT: 52 units/L (ref 0–53)
AST: 51 units/L — ABNORMAL HIGH (ref 0–37)
Alkaline Phosphatase: 75 units/L (ref 39–117)
Basophils Absolute: 0 10*3/uL (ref 0.0–0.1)
Basophils Relative: 0 % (ref 0–1)
Calcium: 9.7 mg/dL (ref 8.4–10.5)
Chloride: 103 meq/L (ref 96–112)
Creatinine, Ser: 1.03 mg/dL (ref 0.40–1.50)
Eosinophils Absolute: 0.1 10*3/uL (ref 0.0–0.7)
Eosinophils Relative: 1 % (ref 0–5)
HCT: 46.4 % (ref 39.0–52.0)
HIV 1 RNA Quant: 33800 copies/mL — ABNORMAL HIGH (ref <20)
Hemoglobin: 15.2 g/dL (ref 13.0–17.0)
Lymphocytes Relative: 30 % (ref 12–46)
MCHC: 32.8 g/dL (ref 30.0–36.0)
MCV: 87.2 fL (ref 78.0–100.0)
Monocytes Absolute: 0.6 10*3/uL (ref 0.1–1.0)
Platelets: 230 10*3/uL (ref 150–400)
Potassium: 4.1 meq/L (ref 3.5–5.3)
RDW: 12.6 % (ref 11.5–15.5)

## 2010-06-10 ENCOUNTER — Ambulatory Visit
Admission: RE | Admit: 2010-06-10 | Discharge: 2010-06-10 | Payer: Self-pay | Source: Home / Self Care | Attending: Internal Medicine | Admitting: Internal Medicine

## 2010-06-10 ENCOUNTER — Emergency Department (HOSPITAL_COMMUNITY)
Admission: EM | Admit: 2010-06-10 | Discharge: 2010-06-10 | Payer: Self-pay | Source: Home / Self Care | Admitting: Emergency Medicine

## 2010-06-10 DIAGNOSIS — R51 Headache: Secondary | ICD-10-CM | POA: Insufficient documentation

## 2010-06-10 DIAGNOSIS — R519 Headache, unspecified: Secondary | ICD-10-CM | POA: Insufficient documentation

## 2010-06-11 ENCOUNTER — Ambulatory Visit (HOSPITAL_COMMUNITY): Admission: RE | Admit: 2010-06-11 | Payer: Self-pay | Source: Home / Self Care | Admitting: Internal Medicine

## 2010-06-16 LAB — POCT I-STAT, CHEM 8
BUN: 23 mg/dL (ref 6–23)
Calcium, Ion: 1.16 mmol/L (ref 1.12–1.32)
Chloride: 105 mEq/L (ref 96–112)
Creatinine, Ser: 1.3 mg/dL (ref 0.4–1.5)
Glucose, Bld: 92 mg/dL (ref 70–99)
HCT: 46 % (ref 39.0–52.0)
Hemoglobin: 15.6 g/dL (ref 13.0–17.0)
Potassium: 3.6 mEq/L (ref 3.5–5.1)
Sodium: 141 mEq/L (ref 135–145)
TCO2: 29 mmol/L (ref 0–100)

## 2010-06-16 LAB — DIFFERENTIAL
Basophils Absolute: 0 10*3/uL (ref 0.0–0.1)
Basophils Relative: 0 % (ref 0–1)
Eosinophils Absolute: 0.1 10*3/uL (ref 0.0–0.7)
Eosinophils Relative: 2 % (ref 0–5)
Lymphocytes Relative: 33 % (ref 12–46)
Lymphs Abs: 2 10*3/uL (ref 0.7–4.0)
Monocytes Absolute: 0.6 10*3/uL (ref 0.1–1.0)
Monocytes Relative: 9 % (ref 3–12)
Neutro Abs: 3.4 10*3/uL (ref 1.7–7.7)
Neutrophils Relative %: 56 % (ref 43–77)

## 2010-06-16 LAB — CBC
HCT: 43.6 % (ref 39.0–52.0)
Hemoglobin: 14.8 g/dL (ref 13.0–17.0)
MCH: 29.3 pg (ref 26.0–34.0)
MCHC: 33.9 g/dL (ref 30.0–36.0)
MCV: 86.3 fL (ref 78.0–100.0)
Platelets: 241 10*3/uL (ref 150–400)
RBC: 5.05 MIL/uL (ref 4.22–5.81)
RDW: 12.6 % (ref 11.5–15.5)
WBC: 6.1 10*3/uL (ref 4.0–10.5)

## 2010-06-16 LAB — SEDIMENTATION RATE: Sed Rate: 8 mm/hr (ref 0–16)

## 2010-06-19 ENCOUNTER — Encounter: Payer: Self-pay | Admitting: Internal Medicine

## 2010-06-19 ENCOUNTER — Ambulatory Visit
Admission: RE | Admit: 2010-06-19 | Discharge: 2010-06-19 | Payer: Self-pay | Source: Home / Self Care | Attending: Internal Medicine | Admitting: Internal Medicine

## 2010-07-01 NOTE — Assessment & Plan Note (Signed)
Summary: F/U/VS   Primary Provider:  Hollace Hayward MD  CC:  NL.  History of Present Illness: Pt had a cold a couple of weeks ago but it seems to have cleared up.  He has not taken his BP medications today. He has been taking his HIV meds every day. He denies HA or CP.  Preventive Screening-Counseling & Management  Alcohol-Tobacco     Alcohol drinks/day: 2     Alcohol type: all     Smoking Status: current     Packs/Day: 1.0     Passive Smoke Exposure: yes  Caffeine-Diet-Exercise     Caffeine use/day: 5+     Does Patient Exercise: yes     Type of exercise: walking     Times/week: 7  Hep-HIV-STD-Contraception     HIV Risk: yes  Safety-Violence-Falls     Seat Belt Use: yes      Sexual History:  currently monogamous.        Drug Use:  current and marijuana.        Blood Transfusions:  no.        Travel History:  none.     Updated Prior Medication List: HYDROCHLOROTHIAZIDE 25 MG TABS (HYDROCHLOROTHIAZIDE) Take 1 tablet by mouth once a day LISINOPRIL 40 MG TABS (LISINOPRIL) Take 1 tablet by mouth once a day CLARITIN 10 MG  TABS (LORATADINE) Take 1 tablet by mouth once a day PEPCID 20 MG  TABS (FAMOTIDINE) Take 1 tablet by mouth two times a day ISENTRESS 400 MG TABS (RALTEGRAVIR POTASSIUM) Take 1 tablet by mouth two times a day TRUVADA 200-300 MG TABS (EMTRICITABINE-TENOFOVIR) Take 1 tablet by mouth once a day VALTREX 1 GM TABS (VALACYCLOVIR HCL) Take 1 tablet by mouth once a day BACTRIM DS 800-160 MG TABS (SULFAMETHOXAZOLE-TRIMETHOPRIM) Take 1 tablet by mouth once a day  Current Allergies (reviewed today): No known allergies  Past History:  Past Medical History: Last updated: 11/11/2007 G6PD deficiency Polysubstance abuse PCP infection, hx of Hepatitis B, hx of Hepatitis C HIV disease, CD4 of 180, VL of 4360 in 8/07 Genital herpes, recurrent outbreaks Oral candidiasis, hx of Syncope, hx of, recurrent Hypertension  Review of Systems  The patient  denies anorexia, fever, chest pain, and dyspnea on exertion.    Vital Signs:  Patient profile:   45 year old male Height:      64 inches (162.56 cm) Weight:      156 pounds (70.91 kg) BMI:     26.87 Pulse rate:   75 / minute BP sitting:   169 / 118  (left arm)  Vitals Entered By: Starleen Arms CMA (June 14, 2009 10:21 AM) CC: NL Is Patient Diabetic? No Pain Assessment Patient in pain? no      Nutritional Status BMI of 25 - 29 = overweight  Does patient need assistance? Functional Status Self care Ambulation Normal   Physical Exam  General:  alert, well-developed, well-nourished, and well-hydrated.   Head:  normocephalic and atraumatic.   Mouth:  pharynx pink and moist.   Lungs:  normal breath sounds.      Impression & Recommendations:  Problem # 1:  HIV DISEASE (ICD-042) Pt.s most recent CD4ct was 190 and VL 1760 .  Pt instructed to continue the current antiretroviral regimen.  Pt encouraged to take medication regularly and not miss doses.  Pt will f/u in 3 months for repeat blood work and will see me 2 weeks later. Will repeat CD4ct if still <200 will need mepron  for PCP prophylaxis (has G6PD deficeincy)  Diagnostics Reviewed:  HIV: CDC-defined AIDS (11/07/2008)   CD4: 190 (05/30/2009)   CD4 %: 13 (07/30/2006) WBC: 6.4 (05/29/2009)   Hgb: 14.7 (05/29/2009)   HCT: 43.6 (05/29/2009)   Platelets: 295 (05/29/2009) HIV genotype: * (02/20/2009)   HIV-1 RNA: 1760 (05/29/2009)   HBSAg: No (07/26/2006)  Problem # 2:  HYPERTENSION (ICD-401.9) Pt encouraged to take his BP medication every day and avoid salt. Will re-check next visit. His updated medication list for this problem includes:    Hydrochlorothiazide 25 Mg Tabs (Hydrochlorothiazide) .Marland Kitchen... Take 1 tablet by mouth once a day    Lisinopril 40 Mg Tabs (Lisinopril) .Marland Kitchen... Take 1 tablet by mouth once a day  Problem # 3:  HEPATITIS C (ICD-070.51) Assessment: Unchanged  Medications Added to Medication List This  Visit: 1)  Bactrim Ds 800-160 Mg Tabs (Sulfamethoxazole-trimethoprim) .... Take 1 tablet by mouth once a day  Other Orders: Est. Patient Level III (16109) Future Orders: T-CD4SP ([facility]) (CD4SP) ... 09/12/2009 T-HIV Viral Load [id number]) ... 09/12/2009 T-Comprehensive Metabolic Panel [id number]) ... 09/12/2009 T-CBC w/Diff ([id number]) ... 09/12/2009 T-RPR (Syphilis) [phone]) ... 09/12/2009 T-Lipid Profile [id number]) ... 09/12/2009  Patient Instructions: 1)  Please schedule a follow-up appointment in 3 months, 2 weeks after labs.  Prescriptions: BACTRIM DS 800-160 MG TABS (SULFAMETHOXAZOLE-TRIMETHOPRIM) Take 1 tablet by mouth once a day  #0 x 0   Entered and Authorized by:   Yisroel Ramming MD   Signed by:   Yisroel Ramming MD on 06/14/2009   Method used:   Print then Give to Patient   RxID:   [id number]  Process Orders Check Orders Results:     [facility]: Order checked:     22930 -- T-Lipid Profile -- ABN required due to diagnosis (CPT: 80061) Tests Sent for requisitioning (June 14, 2009 10:30 AM):     09/12/2009: [facility] -- T-HIV Viral Load [phone] (signed)     09/12/2009: [facility] -- T-Comprehensive Metabolic Panel [[id number]] (signed)     09/12/2009: [facility] -- T-CBC w/Diff [[id number]] (signed)     09/12/2009: [facility] -- T-RPR (Syphilis) [phone] (signed)     09/12/2009: [facility] -- T-Lipid Profile [phone] (signed)    Influenza Immunization History:    Influenza # 1:  Fluvax 3+ (03/01/2009)

## 2010-07-01 NOTE — Progress Notes (Signed)
Summary: Hep A  Phone Note Outgoing Call   Call placed by: Annice Pih Summary of Call: Called pt. and left message that he is due for Hep A vaccine and he just needs to call and be put on nurse schedule Initial call taken by: Wendall Mola CMA Duncan Dull),  February 12, 2010 1:40 PM

## 2010-07-01 NOTE — Assessment & Plan Note (Signed)
Summary: 3WK F/U/VS   Primary Provider:  Hollace Hayward MD  CC:  f/u and not able to sleep at night.  History of Present Illness: Pt here for BP check and results of Integrase resistance test.  The lab ran the wrong test so we are trying to have them run the corrrect test.  They said they could do that since they still have the sample.  Preventive Screening-Counseling & Management  Alcohol-Tobacco     Alcohol drinks/day: 2     Alcohol type: beer     Smoking Status: never     Packs/Day: 1.0     Passive Smoke Exposure: yes   Updated Prior Medication List: HYDROCHLOROTHIAZIDE 25 MG TABS (HYDROCHLOROTHIAZIDE) Take 1 tablet by mouth once a day LISINOPRIL 40 MG TABS (LISINOPRIL) Take 1 tablet by mouth once a day CLARITIN 10 MG  TABS (LORATADINE) Take 1 tablet by mouth once a day PEPCID 20 MG  TABS (FAMOTIDINE) Take 1 tablet by mouth two times a day ISENTRESS 400 MG TABS (RALTEGRAVIR POTASSIUM) Take 1 tablet by mouth two times a day TRUVADA 200-300 MG TABS (EMTRICITABINE-TENOFOVIR) Take 1 tablet by mouth once a day VALTREX 1 GM TABS (VALACYCLOVIR HCL) Take 1 tablet by mouth once a day OMEPRAZOLE 40 MG CPDR (OMEPRAZOLE) Take 1 tablet by mouth once a day ATENOLOL 50 MG TABS (ATENOLOL) Take 1 tablet by mouth once a day BACTRIM DS 800-160 MG TABS (SULFAMETHOXAZOLE-TRIMETHOPRIM) Take 1 tablet by mouth once a day  Current Allergies (reviewed today): No known allergies  Past History:  Past Medical History: Last updated: 11/11/2007 G6PD deficiency Polysubstance abuse PCP infection, hx of Hepatitis B, hx of Hepatitis C HIV disease, CD4 of 180, VL of 4360 in 8/07 Genital herpes, recurrent outbreaks Oral candidiasis, hx of Syncope, hx of, recurrent Hypertension  Review of Systems  The patient denies anorexia, fever, and weight loss.    Vital Signs:  Patient profile:   45 year old male Height:      64 inches (162.56 cm) Weight:      153.75 pounds (69.89 kg) BMI:      26.49 Temp:     98.3 degrees F (36.83 degrees C) oral Pulse rate:   71 / minute BP sitting:   131 / 91  (left arm)  Vitals Entered By: Starleen Arms CMA (Oct 18, 2009 9:37 AM) CC: f/u, not able to sleep at night Is Patient Diabetic? No Pain Assessment Patient in pain? no      Nutritional Status BMI of 25 - 29 = overweight Nutritional Status Detail nl  Does patient need assistance? Functional Status Self care Ambulation Normal   Physical Exam  General:  alert, well-developed, well-nourished, and well-hydrated.   Head:  normocephalic and atraumatic.   Mouth:  pharynx pink and moist.   Lungs:  normal breath sounds.     Impression & Recommendations:  Problem # 1:  HYPERTENSION (ICD-401.9) Assessment Improved  His updated medication list for this problem includes:    Hydrochlorothiazide 25 Mg Tabs (Hydrochlorothiazide) .Marland Kitchen... Take 1 tablet by mouth once a day    Lisinopril 40 Mg Tabs (Lisinopril) .Marland Kitchen... Take 1 tablet by mouth once a day    Atenolol 50 Mg Tabs (Atenolol) .Marland Kitchen... Take 1 tablet by mouth once a day  Orders: New Patient Level II (81191)  Problem # 2:  HIV DISEASE (ICD-042)  Integrase test still pending.  Pt to return in 3 weeks for results. Continue curent meds.  Orders: New Patient Level II (47829)

## 2010-07-01 NOTE — Miscellaneous (Signed)
  Clinical Lists Changes  Observations: Added new observation of YEARAIDSPOS: 2003  (04/21/2010 11:22)

## 2010-07-01 NOTE — Progress Notes (Signed)
Summary: rash  Phone Note Call from Patient   Summary of Call: Pt states he developed a rash from the  medication he was given, He has red spots all over his body.  (Bactrim) Please advise.  Tomasita Morrow RN  April 28, 2010 3:03 PM [facility] on [address] Initial call taken by: Tomasita Morrow RN,  April 28, 2010 3:03 PM  Follow-up for Phone Call        Pt was advised to d/c Bactrim. Take Benadryl 50mg  every 6 hours and drink plenty of water.  We will call on tomorrow for medication replacement.  Tomasita Morrow RN  April 28, 2010 4:53 PM  Follow-up by: Tomasita Morrow RN,  April 28, 2010 4:53 PM  Additional Follow-up for Phone Call Additional follow up Details #1::        needs G6PD level drawn to see if we can switch him to dapsone Additional Follow-up by: Yisroel Ramming MD,  April 29, 2010 2:43 PM    Additional Follow-up for Phone Call Additional follow up Details #2::    Can you please put the order in for this lab, pt. has been contacted and will come for lab on Thursday 05/01/10 Follow-up by: Wendall Mola CMA Duncan Dull),  April 30, 2010 9:07 AM  Additional Follow-up for Phone Call Additional follow up Details #3:: Details for Additional Follow-up Action Taken: done Additional Follow-up by: Yisroel Ramming MD,  April 30, 2010 2:48 PM

## 2010-07-01 NOTE — Assessment & Plan Note (Signed)
Summary: F/U OV/VS   Primary Provider:  Hollace Hayward MD  CC:  follow-up visit, lab results, c/o mouth and throat sore, needs refills of B/P meds, out for 2 days, and B/P elevated.  History of Present Illness: Pt has been under a lot of stress and hs missed multiple doses of his HIV meds.  He states that he is tired of taking meds.  He also c/o painful upper palate. Needs refills on his HTN meds.  Preventive Screening-Counseling & Management  Alcohol-Tobacco     Alcohol drinks/day: 2     Alcohol type: beer     Smoking Status: never     Packs/Day: 1.0     Passive Smoke Exposure: yes  Caffeine-Diet-Exercise     Caffeine use/day: 0     Does Patient Exercise: yes     Type of exercise: walking     Times/week: 7  Hep-HIV-STD-Contraception     HIV Risk: yes  Safety-Violence-Falls     Seat Belt Use: yes      Sexual History:  currently monogamous.        Drug Use:  current and marijuana.        Blood Transfusions:  no.        Travel History:  none.    Comments: pt. declined condoms   Updated Prior Medication List: HYDROCHLOROTHIAZIDE 25 MG TABS (HYDROCHLOROTHIAZIDE) Take 1 tablet by mouth once a day LISINOPRIL 40 MG TABS (LISINOPRIL) Take 1 tablet by mouth once a day CLARITIN 10 MG  TABS (LORATADINE) Take 1 tablet by mouth once a day ISENTRESS 400 MG TABS (RALTEGRAVIR POTASSIUM) Take 1 tablet by mouth two times a day TRUVADA 200-300 MG TABS (EMTRICITABINE-TENOFOVIR) Take 1 tablet by mouth once a day VALTREX 1 GM TABS (VALACYCLOVIR HCL) Take 1 tablet by mouth once a day OMEPRAZOLE 40 MG CPDR (OMEPRAZOLE) Take 1 tablet by mouth once a day ATENOLOL 50 MG TABS (ATENOLOL) Take 1 tablet by mouth once a day BACTRIM DS 800-160 MG TABS (SULFAMETHOXAZOLE-TRIMETHOPRIM) Take 1 tablet by mouth once a day FLUCONAZOLE 100 MG TABS (FLUCONAZOLE) Take 1 tablet by mouth once a day  Current Allergies (reviewed today): No known allergies  Review of Systems  The patient denies anorexia,  fever, and weight loss.    Vital Signs:  Patient profile:   45 year old male Height:      64 inches (162.56 cm) Weight:      150.8 pounds (68.55 kg) BMI:     25.98 Temp:     98.4 degrees F (36.89 degrees C) oral Pulse rate:   76 / minute BP sitting:   155 / 109  (right arm)  Vitals Entered By: Wendall Mola CMA Duncan Dull) (November 22, 2009 9:53 AM) CC: follow-up visit, lab results, c/o mouth and throat sore, needs refills of B/P meds, out for 2 days, B/P elevated Is Patient Diabetic? No Pain Assessment Patient in pain? yes     Location: mouth Intensity: 8 Type: burning Onset of pain  Constant Nutritional Status BMI of 19 -24 = normal Nutritional Status Detail appetite "not too good"  Does patient need assistance? Functional Status Self care Ambulation Normal Comments under alot of stress had friend to pass away   Physical Exam  General:  alert, well-developed, well-nourished, and well-hydrated.   Head:  normocephalic and atraumatic.   Mouth:  pharynx pink and moist.   Lungs:  normal breath sounds.      Impression & Recommendations:  Problem # 1:  HIV  DISEASE (ICD-042) Wil obtain labs today.  Pt encouraged to take his meds every day. Pt to f/u in 2 weeks. Diagnostics Reviewed:  HIV: CDC-defined AIDS (11/07/2008)   CD4: 150 (08/27/2009)   CD4 %: 13 (07/30/2006) WBC: 7.3 (08/26/2009)   Hgb: 15.0 (08/26/2009)   HCT: 45.3 (08/26/2009)   Platelets: 283 (08/26/2009) HIV genotype: * (02/20/2009)   HIV-1 RNA: 16109 (08/26/2009)   HBSAg: No (07/26/2006)  Problem # 2:  CANDIDIASIS, ORAL, HX OF (ICD-V12.09) fluconazole  Problem # 3:  HYPERTENSION (ICD-401.9) refill meds. His updated medication list for this problem includes:    Hydrochlorothiazide 25 Mg Tabs (Hydrochlorothiazide) .Marland Kitchen... Take 1 tablet by mouth once a day    Lisinopril 40 Mg Tabs (Lisinopril) .Marland Kitchen... Take 1 tablet by mouth once a day    Atenolol 50 Mg Tabs (Atenolol) .Marland Kitchen... Take 1 tablet by mouth once a  day  Medications Added to Medication List This Visit: 1)  Fluconazole 100 Mg Tabs (Fluconazole) .... Take 1 tablet by mouth once a day  Other Orders: T-CD4SP ([facility]) (CD4SP) T-HIV Viral Load [id number]) T-Comprehensive Metabolic Panel [phone]) T-CBC w/Diff ([id number]) Est. Patient Level III (96295)  Patient Instructions: 1)  Please schedule a follow-up appointment in 2 - 3 weeks.  Prescriptions: FLUCONAZOLE 100 MG TABS (FLUCONAZOLE) Take 1 tablet by mouth once a day  #10 x 0   Entered and Authorized by:   Yisroel Ramming MD   Signed by:   Yisroel Ramming MD on 11/22/2009   Method used:   Print then Give to Patient   RxID:   [id number] ATENOLOL 50 MG TABS (ATENOLOL) Take 1 tablet by mouth once a day  #30 x 5   Entered and Authorized by:   Yisroel Ramming MD   Signed by:   Yisroel Ramming MD on 11/22/2009   Method used:   Print then Give to Patient   RxID:   [id number] LISINOPRIL 40 MG TABS (LISINOPRIL) Take 1 tablet by mouth once a day  #30 x 5   Entered and Authorized by:   Yisroel Ramming MD   Signed by:   Yisroel Ramming MD on 11/22/2009   Method used:   Print then Give to Patient   RxID:   [id number] HYDROCHLOROTHIAZIDE 25 MG TABS (HYDROCHLOROTHIAZIDE) Take 1 tablet by mouth once a day  #30 x 5   Entered and Authorized by:   Yisroel Ramming MD   Signed by:   Yisroel Ramming MD on 11/22/2009   Method used:   Print then Give to Patient   RxID:   [id number]

## 2010-07-01 NOTE — Progress Notes (Signed)
Summary: phone note-TY  Phone Note Call from Patient   Caller: Patient Call For: Yisroel Ramming MD Reason for Call: Acute Illness Summary of Call: Patient having diarrhea x 5 days along with cold sxs, fever, chills, and stomach cramps. He wants to know if he should come in or if he should keep drinking liquids and treating it like the flu. Could this be something else. I could offer him an appt for tomorrow. Initial call taken by: Starleen Arms CMA,  July 01, 2009 3:04 PM  Follow-up for Phone Call        needs appt. Follow-up by: Yisroel Ramming MD,  July 02, 2009 3:59 PM  Additional Follow-up for Phone Call Additional follow up Details #1::        I left a message to see how he was feeling, and to offer him an appt. if he was still feeling bad. Additional Follow-up by: Starleen Arms CMA,  July 03, 2009 10:29 AM

## 2010-07-01 NOTE — Miscellaneous (Signed)
Summary: Orders Update  Clinical Lists Changes  Orders: Added new Test order of T- * Misc. Laboratory test (99999) - Signed 

## 2010-07-01 NOTE — Assessment & Plan Note (Signed)
Summary: 3wk f/u/vs   Primary Provider:  Hollace Hayward MD  CC:  follow-up visit, lab results, B/P elevated, and had surgery right thumb one week ago.  History of Present Illness: Pt cut his thumb last week on a glass dish and had to have surgery to repair a tendon.  He is returning to the surgeon tomorrow for f/u.  He states that he is doing better regarding taking his medication. He is concerned that his BP is still elevated.  He has been taking his BP medication.  Preventive Screening-Counseling & Management  Alcohol-Tobacco     Alcohol drinks/day: 2     Alcohol type: beer     Smoking Status: never     Passive Smoke Exposure: yes  Caffeine-Diet-Exercise     Caffeine use/day: 0     Does Patient Exercise: yes     Type of exercise: walking     Times/week: 7  Hep-HIV-STD-Contraception     HIV Risk: yes  Safety-Violence-Falls     Seat Belt Use: yes      Sexual History:  currently monogamous.        Drug Use:  current and marijuana.        Blood Transfusions:  no.        Travel History:  none.    Comments: pt. declined condoms   Updated Prior Medication List: HYDROCHLOROTHIAZIDE 25 MG TABS (HYDROCHLOROTHIAZIDE) Take 1 tablet by mouth once a day LISINOPRIL 40 MG TABS (LISINOPRIL) Take 1 tablet by mouth once a day CLARITIN 10 MG  TABS (LORATADINE) Take 1 tablet by mouth once a day ISENTRESS 400 MG TABS (RALTEGRAVIR POTASSIUM) Take 1 tablet by mouth two times a day TRUVADA 200-300 MG TABS (EMTRICITABINE-TENOFOVIR) Take 1 tablet by mouth once a day VALTREX 1 GM TABS (VALACYCLOVIR HCL) Take 1 tablet by mouth once a day OMEPRAZOLE 40 MG CPDR (OMEPRAZOLE) Take 1 tablet by mouth once a day ATENOLOL 50 MG TABS (ATENOLOL) Take 1 tablet by mouth once a day BACTRIM DS 800-160 MG TABS (SULFAMETHOXAZOLE-TRIMETHOPRIM) Take 1 tablet by mouth once a day DIFLUCAN 200 MG TABS (FLUCONAZOLE) take one talblet one time a day CLONIDINE HCL 0.1 MG TABS (CLONIDINE HCL) Take 1 tablet by mouth  two times a day  Current Allergies (reviewed today): No known allergies  Past History:  Past Medical History: Last updated: 11/11/2007 G6PD deficiency Polysubstance abuse PCP infection, hx of Hepatitis B, hx of Hepatitis C HIV disease, CD4 of 180, VL of 4360 in 8/07 Genital herpes, recurrent outbreaks Oral candidiasis, hx of Syncope, hx of, recurrent Hypertension  Review of Systems  The patient denies anorexia, fever, weight loss, and abdominal pain.    Vital Signs:  Patient profile:   45 year old male Height:      64 inches (162.56 cm) Weight:      154.12 pounds (70.05 kg) BMI:     26.55 Temp:     98.5 degrees F (36.94 degrees C) oral Pulse rate:   79 / minute BP sitting:   150 / 100  (right arm)  Vitals Entered By: Wendall Mola CMA Duncan Dull) (December 12, 2009 10:36 AM) CC: follow-up visit, lab results,B/P elevated, had surgery right thumb one week ago Is Patient Diabetic? No Pain Assessment Patient in pain? yes     Location: right thumb Intensity: 8 Type: aching Onset of pain  Constant Nutritional Status BMI of 25 - 29 = overweight Nutritional Status Detail appetite "good"  Does patient need assistance? Functional Status Self  care Ambulation Normal Comments no missed doses of meds per patient   Physical Exam  General:  alert, well-developed, well-nourished, and well-hydrated.   Head:  normocephalic and atraumatic.   Mouth:  pharynx pink and moist.  no thrush  Lungs:  normal breath sounds.      Impression & Recommendations:  Problem # 1:  HIV DISEASE (ICD-042) VL has come down.  Pt will continue current meds and f/u in 3 months for repeat labs. Diagnostics Reviewed:  HIV: CDC-defined AIDS (11/07/2008)   CD4: 170 (11/22/2009)   CD4 %: 13 (07/30/2006) WBC: 6.1 (11/22/2009)   Hgb: 15.2 (11/22/2009)   HCT: 45.9 (11/22/2009)   Platelets: 256 (11/22/2009) HIV genotype: * (02/20/2009)   HIV-1 RNA: 8630 (11/22/2009)   HBSAg: No  (07/26/2006)  Problem # 2:  HYPERTENSION (ICD-401.9) Will add clonidine and stop atenolol. The following medications were removed from the medication list:    Atenolol 50 Mg Tabs (Atenolol) .Marland Kitchen... Take 1 tablet by mouth once a day His updated medication list for this problem includes:    Hydrochlorothiazide 25 Mg Tabs (Hydrochlorothiazide) .Marland Kitchen... Take 1 tablet by mouth once a day    Lisinopril 40 Mg Tabs (Lisinopril) .Marland Kitchen... Take 1 tablet by mouth once a day    Clonidine Hcl 0.1 Mg Tabs (Clonidine hcl) .Marland Kitchen... Take 1 tablet by mouth two times a day  Medications Added to Medication List This Visit: 1)  Diflucan 200 Mg Tabs (Fluconazole) .... Take one talblet one time a day 2)  Clonidine Hcl 0.1 Mg Tabs (Clonidine hcl) .... Take 1 tablet by mouth two times a day  Other Orders: Est. Patient Level III (16109) Future Orders: T-CD4SP ([facility]) (CD4SP) ... 03/12/2010 T-HIV Viral Load [id number]) ... 03/12/2010 T-Comprehensive Metabolic Panel [phone]) ... 03/12/2010 T-CBC w/Diff ([id number]) ... 03/12/2010  Patient Instructions: 1)  Please schedule a follow-up appointment in 3 months, 2 weeks after labs.  Prescriptions: CLONIDINE HCL 0.1 MG TABS (CLONIDINE HCL) Take 1 tablet by mouth two times a day  #60 x 5   Entered and Authorized by:   Yisroel Ramming MD   Signed by:   Yisroel Ramming MD on 12/12/2009   Method used:   Print then Give to Patient   RxID:   [id number]

## 2010-07-01 NOTE — Assessment & Plan Note (Signed)
Summary: 65month f/u/vs   Primary Provider:  Hollace Hayward MD  CC:  follow-up visit, lab results, B/P elevated, and needs refill on Claritin.  History of Present Illness: Pt here for his lab results.  He feels well.  He states that he has bee taking all of his meds. Pain with swallowing has resolved.  Preventive Screening-Counseling & Management  Alcohol-Tobacco     Alcohol drinks/day: 2     Alcohol type: beer     Smoking Status: never     Passive Smoke Exposure: yes  Caffeine-Diet-Exercise     Caffeine use/day: 0     Does Patient Exercise: yes     Type of exercise: walking     Times/week: 7  Hep-HIV-STD-Contraception     HIV Risk: yes  Safety-Violence-Falls     Seat Belt Use: yes      Sexual History:  currently monogamous.        Drug Use:  current and marijuana.        Blood Transfusions:  no.        Travel History:  none.     Updated Prior Medication List: HYDROCHLOROTHIAZIDE 25 MG TABS (HYDROCHLOROTHIAZIDE) Take 1 tablet by mouth once a day LISINOPRIL 40 MG TABS (LISINOPRIL) Take 1 tablet by mouth once a day CLARITIN 10 MG  TABS (LORATADINE) Take 1 tablet by mouth once a day PEPCID 20 MG  TABS (FAMOTIDINE) Take 1 tablet by mouth two times a day ISENTRESS 400 MG TABS (RALTEGRAVIR POTASSIUM) Take 1 tablet by mouth two times a day TRUVADA 200-300 MG TABS (EMTRICITABINE-TENOFOVIR) Take 1 tablet by mouth once a day VALTREX 1 GM TABS (VALACYCLOVIR HCL) Take 1 tablet by mouth once a day OMEPRAZOLE 40 MG CPDR (OMEPRAZOLE) Take 1 tablet by mouth once a day ATENOLOL 50 MG TABS (ATENOLOL) Take 1 tablet by mouth once a day BACTRIM DS 800-160 MG TABS (SULFAMETHOXAZOLE-TRIMETHOPRIM) Take 1 tablet by mouth once a day  Current Allergies (reviewed today): No known allergies  Past History:  Past Medical History: Last updated: 11/11/2007 G6PD deficiency Polysubstance abuse PCP infection, hx of Hepatitis B, hx of Hepatitis C HIV disease, CD4 of 180, VL of 4360 in  8/07 Genital herpes, recurrent outbreaks Oral candidiasis, hx of Syncope, hx of, recurrent Hypertension  Review of Systems  The patient denies anorexia, fever, weight loss, chest pain, and headaches.    Vital Signs:  Patient profile:   45 year old male Height:      64 inches (162.56 cm) Weight:      155.0 pounds (70.45 kg) BMI:     26.70 Temp:     98.4 degrees F (36.89 degrees C) oral Pulse rate:   89 / minute BP sitting:   141 / 103  (right arm)  Vitals Entered By: Wendall Mola CMA Duncan Dull) (September 27, 2009 9:59 AM) CC: follow-up visit, lab results, B/P elevated, needs refill on Claritin Is Patient Diabetic? No Pain Assessment Patient in pain? no      Nutritional Status BMI of 25 - 29 = overweight Nutritional Status Detail appetite "good"  Does patient need assistance? Functional Status Self care Ambulation Normal Comments no missed doses of meds per patient   Physical Exam  General:  alert, well-developed, well-nourished, and well-hydrated.   Head:  normocephalic and atraumatic.   Lungs:  normal breath sounds.     Impression & Recommendations:  Problem # 1:  HIV DISEASE (ICD-042) VL up will obtain an integrase resistance test to see if he  has developed resistance to his current regimen. Pt to return in 3-4 weeks for results.  He will continue his current meds.  Will start PCP prophylaxis with bactrim. Orders: Est. Patient Level IV (86578) T- * Misc. Laboratory test [phone])  Diagnostics Reviewed:  HIV: CDC-defined AIDS (11/07/2008)   CD4: 150 (08/27/2009)   CD4 %: 13 (07/30/2006) WBC: 7.3 (08/26/2009)   Hgb: 15.0 (08/26/2009)   HCT: 45.3 (08/26/2009)   Platelets: 283 (08/26/2009) HIV genotype: * (02/20/2009)   HIV-1 RNA: 95284 (08/26/2009)   HBSAg: No (07/26/2006)  Problem # 2:  HYPERTENSION (ICD-401.9) will add atenolol and check BP next visit. His updated medication list for this problem includes:    Hydrochlorothiazide 25 Mg Tabs  (Hydrochlorothiazide) .Marland Kitchen... Take 1 tablet by mouth once a day    Lisinopril 40 Mg Tabs (Lisinopril) .Marland Kitchen... Take 1 tablet by mouth once a day    Atenolol 50 Mg Tabs (Atenolol) .Marland Kitchen... Take 1 tablet by mouth once a day  Orders: Est. Patient Level IV (13244)  Medications Added to Medication List This Visit: 1)  Atenolol 50 Mg Tabs (Atenolol) .... Take 1 tablet by mouth once a day 2)  Bactrim Ds 800-160 Mg Tabs (Sulfamethoxazole-trimethoprim) .... Take 1 tablet by mouth once a day  Patient Instructions: 1)  Please schedule a follow-up appointment in 3-4 weeks. Prescriptions: BACTRIM DS 800-160 MG TABS (SULFAMETHOXAZOLE-TRIMETHOPRIM) Take 1 tablet by mouth once a day  #30 x 5   Entered and Authorized by:   Yisroel Ramming MD   Signed by:   Yisroel Ramming MD on 09/27/2009   Method used:   Print then Give to Patient   RxID:   [id number] ATENOLOL 50 MG TABS (ATENOLOL) Take 1 tablet by mouth once a day  #30 x 5   Entered and Authorized by:   Yisroel Ramming MD   Signed by:   Yisroel Ramming MD on 09/27/2009   Method used:   Print then Give to Patient   RxID:   [id number] CLARITIN 10 MG  TABS (LORATADINE) Take 1 tablet by mouth once a day  #30 x 5   Entered and Authorized by:   Yisroel Ramming MD   Signed by:   Yisroel Ramming MD on 09/27/2009   Method used:   Print then Give to Patient   RxID:   [id number]

## 2010-07-01 NOTE — Assessment & Plan Note (Signed)
Summary: F/U/VS   Primary Provider:  Hollace Hayward MD  CC:  follow-up visit, lab results, B/P very elevated, feels he is allergic to Bactrim causing rash, sore on back of neck, and refill Truvada.  History of Present Illness: patient here for follow-up on his labs.  He states he's been taking his HIV medications and his blood pressure medications.  Patient does drink alcohol every day.  He is not willing to give up his uncle.  Patient complains of a lesion near his neck.  He states that it is not pruritic and is not painful.  He also states that he has been developing a rash on the Bactrim.  The rash is fairly localized and is not pruritic.  Preventive Screening-Counseling & Management  Alcohol-Tobacco     Alcohol drinks/day: 6     Alcohol type: beer     Smoking Status: never     Passive Smoke Exposure: yes  Caffeine-Diet-Exercise     Caffeine use/day: 0     Does Patient Exercise: yes     Type of exercise: walking     Times/week: 7  Hep-HIV-STD-Contraception     HIV Risk: no risk noted  Safety-Violence-Falls     Seat Belt Use: yes      Sexual History:  currently monogamous.        Drug Use:  current, marijuana, and marijuana.        Blood Transfusions:  no.        Travel History:  none.    Comments: pt. given condoms   Updated Prior Medication List: HYDROCHLOROTHIAZIDE 25 MG TABS (HYDROCHLOROTHIAZIDE) Take 1 tablet by mouth once a day LISINOPRIL 40 MG TABS (LISINOPRIL) Take 1 tablet by mouth once a day CLARITIN 10 MG  TABS (LORATADINE) Take 1 tablet by mouth once a day ISENTRESS 400 MG TABS (RALTEGRAVIR POTASSIUM) Take 1 tablet by mouth two times a day TRUVADA 200-300 MG TABS (EMTRICITABINE-TENOFOVIR) Take 1 tablet by mouth once a day VALTREX 1 GM TABS (VALACYCLOVIR HCL) Take 1 tablet by mouth once a day OMEPRAZOLE 40 MG CPDR (OMEPRAZOLE) Take 1 tablet by mouth once a day BACTRIM DS 800-160 MG TABS (SULFAMETHOXAZOLE-TRIMETHOPRIM) Take 1 tablet by mouth once a  day CLONIDINE HCL 0.2 MG TABS (CLONIDINE HCL) Take 1 tablet by mouth two times a day  Current Allergies (reviewed today): No known allergies  Past History:  Past Medical History: Last updated: 11/11/2007 G6PD deficiency Polysubstance abuse PCP infection, hx of Hepatitis B, hx of Hepatitis C HIV disease, CD4 of 180, VL of 4360 in 8/07 Genital herpes, recurrent outbreaks Oral candidiasis, hx of Syncope, hx of, recurrent Hypertension  Social History: Drug Use:  current, marijuana, marijuana  Review of Systems  The patient denies anorexia, fever, and weight loss.    Vital Signs:  Patient profile:   45 year old male Height:      64 inches (162.56 cm) Weight:      157.8 pounds (71.73 kg) BMI:     27.18 Temp:     98.4 degrees F (36.89 degrees C) oral Pulse rate:   89 / minute BP sitting:   181 / 127  (left arm)  Vitals Entered By: Wendall Mola CMA Duncan Dull) (April 23, 2010 10:02 AM) CC: follow-up visit, lab results, B/P very elevated, feels he is allergic to Bactrim causing rash, sore on back of neck, refill Truvada Is Patient Diabetic? No Pain Assessment Patient in pain? no      Nutritional Status BMI of  25 - 29 = overweight Nutritional Status Detail appetite "good"  Have you ever been in a relationship where you felt threatened, hurt or afraid?No   Does patient need assistance? Functional Status Self care Ambulation Normal Comments pt. has missed one or two doses of HAART meds   Physical Exam  General:  alert, well-developed, well-nourished, and well-hydrated.   Head:  normocephalic and atraumatic.   Mouth:  pharynx pink and moist.   Lungs:  normal breath sounds.   Skin:  oval erythematous lesion on neck   Impression & Recommendations:  Problem # 1:  HIV DISEASE (ICD-042) I do not believe pt is taking his HIV meds. I have spoken with mim on multiple occasions about taking his meds. I will repeat his labsin 3 months. Diagnostics Reviewed:  HIV:  CDC-defined AIDS (11/07/2008)   CD4: 170 (03/28/2010)   CD4 %: 13 (07/30/2006) WBC: 6.6 (03/27/2010)   Hgb: 14.0 (03/27/2010)   HCT: 41.1 (03/27/2010)   Platelets: 248 (03/27/2010) HIV genotype: * (02/20/2009)   HIV-1 RNA: 52500 (03/27/2010)   HBSAg: No (07/26/2006)  Problem # 2:  HEPATITIS C (ICD-070.51) Hep A vaccine given P tinstructed to avoid ETOH.  Problem # 3:  HYPERTENSION (ICD-401.9) Will increase his clonidine. Pt encouraged to take his meds and avoid ETOH. The following medications were removed from the medication list:    Clonidine Hcl 0.1 Mg Tabs (Clonidine hcl) .Marland Kitchen... Take 1 tablet by mouth two times a day His updated medication list for this problem includes:    Hydrochlorothiazide 25 Mg Tabs (Hydrochlorothiazide) .Marland Kitchen... Take 1 tablet by mouth once a day    Lisinopril 40 Mg Tabs (Lisinopril) .Marland Kitchen... Take 1 tablet by mouth once a day    Clonidine Hcl 0.2 Mg Tabs (Clonidine hcl) .Marland Kitchen... Take 1 tablet by mouth two times a day  Problem # 4:  SKIN RASH (ICD-782.1) if persists will refer to Dermatology  Medications Added to Medication List This Visit: 1)  Clonidine Hcl 0.2 Mg Tabs (Clonidine hcl) .... Take 1 tablet by mouth two times a day  Other Orders: Est. Patient Level III (24401) Influenza Vaccine MCR (02725) Hepatitis A Vaccine (Adult Dose) (36644) Admin 1st Vaccine (03474) Future Orders: T-CD4SP ([facility]) (CD4SP) ... 07/22/2010 T-HIV Viral Load [phone]) ... 07/22/2010 T-Comprehensive Metabolic Panel [phone]) ... 07/22/2010 T-CBC w/Diff ([id number]) ... 07/22/2010  Patient Instructions: 1)  Please schedule a follow-up appointment in 3 months, 2 weeks after labs.  Prescriptions: TRUVADA 200-300 MG TABS (EMTRICITABINE-TENOFOVIR) Take 1 tablet by mouth once a day  #30 Tablet x 5   Entered and Authorized by:   Yisroel Ramming MD   Signed by:   Yisroel Ramming MD on 04/23/2010   Method used:   Print then Give to Patient   RxID:   [id number] CLONIDINE HCL  0.2 MG TABS (CLONIDINE HCL) Take 1 tablet by mouth two times a day  #60 x 5   Entered and Authorized by:   Yisroel Ramming MD   Signed by:   Yisroel Ramming MD on 04/23/2010   Method used:   Print then Give to Patient   RxID:   [id number]       Immunizations Administered:  Influenza Vaccine # 1:    Vaccine Type: Fluvax MCR    Site: left deltoid    Mfr: Novartis    Dose: 0.5 ml    Route: IM    Given by: Wendall Mola CMA ( AAMA)    Exp. Date: 08/31/2010    Lot #:  [id number]    VIS given: 12/24/09 version given April 23, 2010.  Hepatitis A Vaccine # 1:    Vaccine Type: HepA    Site: right deltoid    Mfr: GlaxoSmithKline    Dose: 0.5 ml    Route: IM    Given by: Wendall Mola CMA ( AAMA)    Exp. Date: 07/04/2012    Lot #: [id number]    VIS given: 08/19/04 version given April 23, 2010.  Flu Vaccine Consent Questions:    Do you have a history of severe allergic reactions to this vaccine? no    Any prior history of allergic reactions to egg and/or gelatin? no    Do you have a sensitivity to the preservative Thimersol? no    Do you have a past history of Guillan-Barre Syndrome? no    Do you currently have an acute febrile illness? no    Have you ever had a severe reaction to latex? no    Vaccine information given and explained to patient? yes

## 2010-07-01 NOTE — Assessment & Plan Note (Signed)
Summary: F/U/VS   Primary Provider:  Hollace Hayward MD   History of Present Illness: pt jus had labs today   Current Allergies: No known allergies   Other Orders: No Charge Patient Arrived (NCPA0) (NCPA0)

## 2010-07-01 NOTE — Assessment & Plan Note (Signed)
Summary: knot in the center of chest/TY   Primary Provider:  Hollace Hayward MD  CC:  pt. complaining of severe indigestion.  History of Present Illness: Pt c/o severe indigestion.  The pepcid is not helping. He states that things do not get stuck it just feels like there is a knot in his esophagus.  Preventive Screening-Counseling & Management  Alcohol-Tobacco     Alcohol drinks/day: 2     Alcohol type: beer     Smoking Status: current     Packs/Day: 1.0     Passive Smoke Exposure: yes  Caffeine-Diet-Exercise     Caffeine use/day: 0     Does Patient Exercise: yes     Type of exercise: walking     Times/week: 7      Sexual History:  currently monogamous.     Updated Prior Medication List: HYDROCHLOROTHIAZIDE 25 MG TABS (HYDROCHLOROTHIAZIDE) Take 1 tablet by mouth once a day LISINOPRIL 40 MG TABS (LISINOPRIL) Take 1 tablet by mouth once a day CLARITIN 10 MG  TABS (LORATADINE) Take 1 tablet by mouth once a day PEPCID 20 MG  TABS (FAMOTIDINE) Take 1 tablet by mouth two times a day ISENTRESS 400 MG TABS (RALTEGRAVIR POTASSIUM) Take 1 tablet by mouth two times a day TRUVADA 200-300 MG TABS (EMTRICITABINE-TENOFOVIR) Take 1 tablet by mouth once a day VALTREX 1 GM TABS (VALACYCLOVIR HCL) Take 1 tablet by mouth once a day OMEPRAZOLE 40 MG CPDR (OMEPRAZOLE) Take 1 tablet by mouth once a day  Current Allergies (reviewed today): No known allergies  Past History:  Past Medical History: Last updated: 11/11/2007 G6PD deficiency Polysubstance abuse PCP infection, hx of Hepatitis B, hx of Hepatitis C HIV disease, CD4 of 180, VL of 4360 in 8/07 Genital herpes, recurrent outbreaks Oral candidiasis, hx of Syncope, hx of, recurrent Hypertension  Vital Signs:  Patient profile:   45 year old male Height:      64 inches (162.56 cm) Weight:      158.9 pounds (72.23 kg) BMI:     27.37 Temp:     98.0 degrees F (36.67 degrees C) oral Pulse rate:   92 / minute BP sitting:   150 / 107   (left arm)  Vitals Entered By: Wendall Mola CMA Duncan Dull) (August 26, 2009 1:49 PM) CC: pt. complaining of severe indigestion Is Patient Diabetic? No Pain Assessment Patient in pain? no      Nutritional Status BMI of 25 - 29 = overweight Nutritional Status Detail appetite "so-so"  Have you ever been in a relationship where you felt threatened, hurt or afraid?No   Does patient need assistance? Functional Status Self care Ambulation Normal   Physical Exam  General:  alert, well-developed, well-nourished, and well-hydrated.   Head:  normocephalic and atraumatic.   Lungs:  normal breath sounds.   Abdomen:  soft and non-tender.     Impression & Recommendations:  Problem # 1:  GERD (ICD-530.81) Will switch him to omeprazole. If perisists patient to notify e so we can refer for endoscopy. His updated medication list for this problem includes:    Pepcid 20 Mg Tabs (Famotidine) .Marland Kitchen... Take 1 tablet by mouth two times a day    Omeprazole 40 Mg Cpdr (Omeprazole) .Marland Kitchen... Take 1 tablet by mouth once a day  Orders: Est. Patient Level III (52841)  Problem # 2:  HIV DISEASE (ICD-042) Will obtain labs today. Pt to continue current meds. Diagnostics Reviewed:  HIV: CDC-defined AIDS (11/07/2008)   CD4: 190 (05/30/2009)  CD4 %: 13 (07/30/2006) WBC: 6.4 (05/29/2009)   Hgb: 14.7 (05/29/2009)   HCT: 43.6 (05/29/2009)   Platelets: 295 (05/29/2009) HIV genotype: * (02/20/2009)   HIV-1 RNA: 1760 (05/29/2009)   HBSAg: No (07/26/2006)  Medications Added to Medication List This Visit: 1)  Omeprazole 40 Mg Cpdr (Omeprazole) .... Take 1 tablet by mouth once a day Prescriptions: OMEPRAZOLE 40 MG CPDR (OMEPRAZOLE) Take 1 tablet by mouth once a day  #30 x 5   Entered and Authorized by:   Yisroel Ramming MD   Signed by:   Yisroel Ramming MD on 08/26/2009   Method used:   Print then Give to Patient   RxID:   [id number]

## 2010-07-02 ENCOUNTER — Encounter: Payer: Self-pay | Admitting: Internal Medicine

## 2010-07-03 NOTE — Assessment & Plan Note (Addendum)
Summary: f/u [mkj]   CC:  ER followup and shingles.  Preventive Screening-Counseling & Management  Alcohol-Tobacco     Alcohol drinks/day: 6     Alcohol type: beer     Smoking Status: never     Packs/Day: 1.0     Passive Smoke Exposure: yes  Caffeine-Diet-Exercise     Caffeine use/day: 0     Does Patient Exercise: yes     Type of exercise: walking     Times/week: 7  Hep-HIV-STD-Contraception     HIV Risk: no risk noted  Safety-Violence-Falls     Seat Belt Use: yes      Sexual History:  currently monogamous.        Drug Use:  current, marijuana, and marijuana.        Blood Transfusions:  no.        Travel History:  none.      Current Allergies (reviewed today): No known allergies  Vital Signs:  Patient profile:   45 year old male Height:      64 inches (162.56 cm) Weight:      157.8 pounds (71.73 kg) BMI:     27.18 Temp:     98.3 degrees F (36.83 degrees C) oral Pulse rate:   90 / minute BP sitting:   125 / 90  (right arm)  Vitals Entered By: Wendall Mola CMA Duncan Dull) (June 19, 2010 2:06 PM) CC: ER followup, shingles Is Patient Diabetic? No Pain Assessment Patient in pain? yes     Location: face Intensity: 10 Type: aching Onset of pain  Constant Nutritional Status BMI of 25 - 29 = overweight Nutritional Status Detail appetite "good"  Have you ever been in a relationship where you felt threatened, hurt or afraid?No   Does patient need assistance? Functional Status Self care Ambulation Normal    Impression & Recommendations:  Problem # 1:  HIV DISEASE (ICD-042)  His updated medication list for this problem includes:    Valtrex 1 Gm Tabs (Valacyclovir hcl) .Marland Kitchen... Take 1 tablet by mouth once a day    Bactrim Ds 800-160 Mg Tabs (Sulfamethoxazole-trimethoprim) .Marland Kitchen... Take 1 tablet by mouth once a day  Orders: Est. Patient Level II (04540)

## 2010-07-03 NOTE — Assessment & Plan Note (Signed)
Summary: ear pain/vs   Primary Provider:  Hollace Hayward MD  CC:  pt. c/o sharp pain back of head right side x 2 days.  History of Present Illness: patient complains of sharp shooting pains over the right side of his head for the last 2 days.  He denies fever stiff neck or cold symptoms.  He denies rash.  He does not know of any injury that he had suffered to that area.  The pain is sharp and intermittent.  No over-the-counter medications as improved the pain.  He denies any other neurological symptoms.  Preventive Screening-Counseling & Management  Alcohol-Tobacco     Alcohol drinks/day: 6     Alcohol type: beer     Smoking Status: never     Packs/Day: 1.0     Passive Smoke Exposure: yes  Caffeine-Diet-Exercise     Caffeine use/day: 0     Does Patient Exercise: yes     Type of exercise: walking     Times/week: 7  Hep-HIV-STD-Contraception     HIV Risk: no risk noted  Safety-Violence-Falls     Seat Belt Use: yes      Sexual History:  currently monogamous.        Drug Use:  current, marijuana, and marijuana.        Blood Transfusions:  no.        Travel History:  none.     Updated Prior Medication List: HYDROCHLOROTHIAZIDE 25 MG TABS (HYDROCHLOROTHIAZIDE) Take 1 tablet by mouth once a day LISINOPRIL 40 MG TABS (LISINOPRIL) Take 1 tablet by mouth once a day CLARITIN 10 MG  TABS (LORATADINE) Take 1 tablet by mouth once a day ISENTRESS 400 MG TABS (RALTEGRAVIR POTASSIUM) Take 1 tablet by mouth two times a day TRUVADA 200-300 MG TABS (EMTRICITABINE-TENOFOVIR) Take 1 tablet by mouth once a day VALTREX 1 GM TABS (VALACYCLOVIR HCL) Take 1 tablet by mouth once a day OMEPRAZOLE 40 MG CPDR (OMEPRAZOLE) Take 1 tablet by mouth once a day BACTRIM DS 800-160 MG TABS (SULFAMETHOXAZOLE-TRIMETHOPRIM) Take 1 tablet by mouth once a day CLONIDINE HCL 0.2 MG TABS (CLONIDINE HCL) Take 1 tablet by mouth two times a day VICODIN 5-500 MG TABS (HYDROCODONE-ACETAMINOPHEN) Take 1 tablet by mouth  every 8 hours as needed  Current Allergies (reviewed today): No known allergies  Past History:  Past Medical History: Last updated: 11/11/2007 G6PD deficiency Polysubstance abuse PCP infection, hx of Hepatitis B, hx of Hepatitis C HIV disease, CD4 of 180, VL of 4360 in 8/07 Genital herpes, recurrent outbreaks Oral candidiasis, hx of Syncope, hx of, recurrent Hypertension  Review of Systems  The patient denies anorexia, fever, weight loss, and syncope.    Vital Signs:  Patient profile:   45 year old male Height:      64 inches (162.56 cm) Weight:      159.12 pounds (72.33 kg) BMI:     27.41 Temp:     98.5 degrees F (36.94 degrees C) oral Pulse rate:   87 / minute BP sitting:   134 / 92  (right arm)  Vitals Entered By: Wendall Mola CMA Duncan Dull) (June 10, 2010 2:06 PM) CC: pt. c/o sharp pain back of head right side x 2 days Is Patient Diabetic? No Pain Assessment Patient in pain? yes     Location: head Intensity: 10 Type: sharp Onset of pain  Constant Nutritional Status BMI of 25 - 29 = overweight Nutritional Status Detail appetite "good"  Have you ever been in a relationship  where you felt threatened, hurt or afraid?No   Does patient need assistance? Functional Status Self care Ambulation Normal Comments no missed doses of meds per pt.   Physical Exam  General:  alert, well-developed, well-nourished, and well-hydrated.  alert, well-developed, well-nourished, and well-hydrated.   Head:  normocephalic and atraumatic.  noscalp rashnormocephalic and atraumatic.   Eyes:  pupils equal, pupils round, and pupils reactive to light.  pupils equal, pupils round, and pupils reactive to light.   Ears:  R ear normal and L ear normal.  R ear normal and L ear normal.   Mouth:  pharynx pink and moist.  pharynx pink and moist.   Neck:  supple and full ROM.  supple and full ROM.   Neurologic:  alert & oriented X3, cranial nerves II-XII intact, strength normal in  all extremities, sensation intact to light touch, and gait normal.  alert & oriented X3, cranial nerves II-XII intact, strength normal in all extremities, sensation intact to light touch, and gait normal.     Impression & Recommendations:  Problem # 1:  CEPHALGIA (ICD-784.0)  no signs of zoster - pt informed to call if he develops a rash will obtain a CT of his Brain Vicodin for pain His updated medication list for this problem includes:    Vicodin 5-500 Mg Tabs (Hydrocodone-acetaminophen) .Marland Kitchen... Take 1 tablet by mouth every 8 hours as needed    His updated medication list for this problem includes:    Vicodin 5-500 Mg Tabs (Hydrocodone-acetaminophen) .Marland Kitchen... Take 1 tablet by mouth every 8 hours as needed  Orders: Est. Patient Level III (16109) CT with & without Contrast (CT w&w/o Contrast)  Problem # 2:  HIV DISEASE (ICD-042)  Pt to f/u as scheduled. His updated medication list for this problem includes:    Valtrex 1 Gm Tabs (Valacyclovir hcl) .Marland Kitchen... Take 1 tablet by mouth once a day    Bactrim Ds 800-160 Mg Tabs (Sulfamethoxazole-trimethoprim) .Marland Kitchen... Take 1 tablet by mouth once a day  Diagnostics Reviewed:  HIV: CDC-defined AIDS (11/07/2008)   CD4: 210 (06/04/2010)   CD4 %: 13 (07/30/2006) WBC: 7.3 (06/03/2010)   Hgb: 15.2 (06/03/2010)   HCT: 46.4 (06/03/2010)   Platelets: 230 (06/03/2010) HIV genotype: * (02/20/2009)   HIV-1 RNA: 33800 (06/03/2010)   HBSAg: No (07/26/2006)  His updated medication list for this problem includes:    Valtrex 1 Gm Tabs (Valacyclovir hcl) .Marland Kitchen... Take 1 tablet by mouth once a day    Bactrim Ds 800-160 Mg Tabs (Sulfamethoxazole-trimethoprim) .Marland Kitchen... Take 1 tablet by mouth once a day  Medications Added to Medication List This Visit: 1)  Vicodin 5-500 Mg Tabs (Hydrocodone-acetaminophen) .... Take 1 tablet by mouth every 8 hours as needed Prescriptions: VICODIN 5-500 MG TABS (HYDROCODONE-ACETAMINOPHEN) Take 1 tablet by mouth every 8 hours as needed  #30 x  0   Entered and Authorized by:   Yisroel Ramming MD   Signed by:   Yisroel Ramming MD on 06/10/2010   Method used:   Print then Give to Patient   RxID:   [id number]

## 2010-07-15 ENCOUNTER — Encounter: Payer: Self-pay | Admitting: Internal Medicine

## 2010-07-23 NOTE — Letter (Signed)
Summary: C-Out  C-Out   Imported By: Florinda Marker 07/15/2010 17:17:17  _____________________________________________________________________  External Attachment:    Type:   Image     Comment:   External Document

## 2010-07-29 NOTE — Progress Notes (Signed)
Summary: ID Clinic Note  ID Clinic Note   Imported By: Florinda Marker 07/22/2010 14:52:25  _____________________________________________________________________  External Attachment:    Type:   Image     Comment:   External Document

## 2010-08-11 LAB — T-HELPER CELL (CD4) - (RCID CLINIC ONLY)
CD4 % Helper T Cell: 10 % — ABNORMAL LOW (ref 33–55)
CD4 T Cell Abs: 210 uL — ABNORMAL LOW (ref 400–2700)

## 2010-08-13 LAB — T-HELPER CELL (CD4) - (RCID CLINIC ONLY): CD4 T Cell Abs: 170 uL — ABNORMAL LOW (ref 400–2700)

## 2010-08-17 LAB — POCT I-STAT 4, (NA,K, GLUC, HGB,HCT)
Glucose, Bld: 74 mg/dL (ref 70–99)
Hemoglobin: 15 g/dL (ref 13.0–17.0)
Potassium: 3.9 mEq/L (ref 3.5–5.1)

## 2010-08-17 LAB — T-HELPER CELL (CD4) - (RCID CLINIC ONLY)
CD4 % Helper T Cell: 10 % — ABNORMAL LOW (ref 33–55)
CD4 T Cell Abs: 170 uL — ABNORMAL LOW (ref 400–2700)

## 2010-09-01 LAB — T-HELPER CELL (CD4) - (RCID CLINIC ONLY): CD4 T Cell Abs: 190 uL — ABNORMAL LOW (ref 400–2700)

## 2010-09-05 LAB — T-HELPER CELL (CD4) - (RCID CLINIC ONLY): CD4 % Helper T Cell: 12 % — ABNORMAL LOW (ref 33–55)

## 2010-09-09 LAB — T-HELPER CELL (CD4) - (RCID CLINIC ONLY): CD4 T Cell Abs: 200 uL — ABNORMAL LOW (ref 400–2700)

## 2010-09-15 ENCOUNTER — Other Ambulatory Visit (INDEPENDENT_AMBULATORY_CARE_PROVIDER_SITE_OTHER): Payer: Medicare Other

## 2010-09-15 ENCOUNTER — Other Ambulatory Visit (INDEPENDENT_AMBULATORY_CARE_PROVIDER_SITE_OTHER): Payer: Medicare Other | Admitting: *Deleted

## 2010-09-15 DIAGNOSIS — B20 Human immunodeficiency virus [HIV] disease: Secondary | ICD-10-CM

## 2010-09-15 DIAGNOSIS — K219 Gastro-esophageal reflux disease without esophagitis: Secondary | ICD-10-CM

## 2010-09-15 DIAGNOSIS — Z21 Asymptomatic human immunodeficiency virus [HIV] infection status: Secondary | ICD-10-CM

## 2010-09-15 DIAGNOSIS — Z79899 Other long term (current) drug therapy: Secondary | ICD-10-CM

## 2010-09-15 LAB — COMPREHENSIVE METABOLIC PANEL
ALT: 118 U/L — ABNORMAL HIGH (ref 0–53)
AST: 146 U/L — ABNORMAL HIGH (ref 0–37)
Albumin: 4.2 g/dL (ref 3.5–5.2)
Alkaline Phosphatase: 69 U/L (ref 39–117)
BUN: 11 mg/dL (ref 6–23)
Chloride: 101 mEq/L (ref 96–112)
Potassium: 3.8 mEq/L (ref 3.5–5.3)
Sodium: 140 mEq/L (ref 135–145)
Total Protein: 8.6 g/dL — ABNORMAL HIGH (ref 6.0–8.3)

## 2010-09-15 LAB — CBC WITH DIFFERENTIAL/PLATELET
Basophils Absolute: 0 10*3/uL (ref 0.0–0.1)
Basophils Relative: 0 % (ref 0–1)
Lymphocytes Relative: 42 % (ref 12–46)
MCHC: 33.8 g/dL (ref 30.0–36.0)
Neutro Abs: 2.5 10*3/uL (ref 1.7–7.7)
Neutrophils Relative %: 44 % (ref 43–77)
Platelets: 231 10*3/uL (ref 150–400)
RDW: 12.7 % (ref 11.5–15.5)
WBC: 5.7 10*3/uL (ref 4.0–10.5)

## 2010-09-15 LAB — T-HELPER CELL (CD4) - (RCID CLINIC ONLY)
CD4 % Helper T Cell: 11 % — ABNORMAL LOW (ref 33–55)
CD4 T Cell Abs: 160 uL — ABNORMAL LOW (ref 400–2700)

## 2010-09-15 LAB — LIPID PANEL
Cholesterol: 159 mg/dL (ref 0–200)
Total CHOL/HDL Ratio: 3.2 Ratio

## 2010-09-15 LAB — RPR

## 2010-09-15 MED ORDER — OMEPRAZOLE 40 MG PO CPDR: 40.0000 mg | DELAYED_RELEASE_CAPSULE | Freq: Every day | ORAL | Status: DC

## 2010-09-16 LAB — T-HELPER CELL (CD4) - (RCID CLINIC ONLY)
CD4 % Helper T Cell: 12 % — ABNORMAL LOW (ref 33–55)
CD4 T Cell Abs: 290 uL — ABNORMAL LOW (ref 400–2700)

## 2010-09-16 LAB — HIV-1 RNA QUANT-NO REFLEX-BLD
HIV 1 RNA Quant: 49000 copies/mL — ABNORMAL HIGH (ref <20)
HIV-1 RNA Quant, Log: 4.69 {Log} — ABNORMAL HIGH (ref <1.30)

## 2010-09-17 ENCOUNTER — Other Ambulatory Visit (INDEPENDENT_AMBULATORY_CARE_PROVIDER_SITE_OTHER): Payer: Medicare Other | Admitting: Adult Health

## 2010-09-17 DIAGNOSIS — B009 Herpesviral infection, unspecified: Secondary | ICD-10-CM

## 2010-09-17 MED ORDER — VALACYCLOVIR HCL 1 G PO TABS: 1000.0000 mg | ORAL_TABLET | Freq: Every day | ORAL | Status: DC

## 2010-09-29 ENCOUNTER — Ambulatory Visit: Payer: Self-pay | Admitting: Adult Health

## 2010-10-13 ENCOUNTER — Ambulatory Visit: Payer: Self-pay | Admitting: Adult Health

## 2010-10-17 NOTE — Discharge Summary (Signed)
[name], [name]                        ACCOUNT NO.:  [id number]   MEDICAL RECORD NO.:  [id number]                   PATIENT TYPE:  INP   LOCATION:  3714                                 FACILITY:  [facility]   PHYSICIAN:  Laurell Roof, M.D.                DATE OF BIRTH:  03/02/1966   DATE OF ADMISSION:  07/05/2003  DATE OF DISCHARGE:  07/07/2003                                 DISCHARGE SUMMARY   PRIMARY CARE PHYSICIAN:  Dr. Ninetta Lights at [facility].   DISCHARGE DIAGNOSES:  1. Recurrent syncope.  2. Human immunodeficiency virus.  3. Nausea and vomiting.  4. Hypokalemia.  5. Trauma secondary to #1.  6. Hepatitis C.  7. Hepatitis B.  8. G6PD deficiency.  9. Polysubstance abuse, including cocaine, marijuana and alcohol use and     abuse.   CONSULTS:  None.   PROCEDURES:  1. EKG, July 06, 2003:  Normal sinus rhythm at 93 beats per minute, T     wave changes in V1, V2 and V3, nonspecific.  2. Head CT negative for intracranial hemorrhage.  3. July 06, 2003:  2-D echo showed no aortic stenosis, ejection fraction     60%, but wall motion was difficult to assess.  4. EEG performed, but pending results.   ADMISSION H&P:  The patient is a 45 year old male with a past medical  history of HIV and syncope status post cardiac and neurological evaluation  who presented to the emergency room after a syncopal episode.  The syncopal  episode followed a prodrome of nausea, dizziness and warmth.  The patient  reports four previous events similar to this over the past three to four  years.  The patient does report consuming two to three beers earlier in the  evening, in addition to use of marijuana.  Cocaine use on this occasion was  denied.  No loss of consciousness or bladder control during this episode.  The patient does report past loss of bladder or bowel control during  previous episodes.   HOSPITAL COURSE:  1. Recurrent syncope:  The patient has had a full  cardiac and neurological     workup in the past, but record of this workup was not found.  The patient     was evaluated during this hospital stay for cardiogenic causes of     syncope, including arrhythmia, HOCM, aortic stenosis, and acute MI.  The     patient had no evidence on EKG or telemetry of arrhythmia.  The patient     had no evidence of HOCM or aortic stenosis on 2-D echo.  The patient's     initial cardiac enzymes were within normal limits.  TIA and CVA were     unlikely, given the patient's age and negative head CT for acute bleed.     Seizures were evaluated by performing an EEG, although the results of  this are not available at the time of discharge.  There is no evidence of     toxic or metabolic etiology such as electrolyte disturbance.  The patient     had no hypotension or orthostatic hypotension on repeat blood pressure,     although initial blood pressure was an abnormal reading.  The patient had     no evidence of hypoxia, tachycardia, or shortness of breath that would     bring pulmonary embolism forward as a diagnosis.  The patient was found     to have elevated alcohol levels, acute intoxication with alcohol, and     positive drug screen for marijuana and cocaine.  The most likely etiology     for the patient's syncopal event is polysubstance abuse and acute     intoxication.  2. Head trauma:  The patient injured his head from falling secondary to the     syncopal event.  There was no evidence of intracranial hemorrhage on CT     scan, and no mental status change.  3. Polysubstance abuse:  The patient was offered Addictive Drug Service,     Narcotics Anonymous and Alcoholics Anonymous contact numbers.  He will     call and follow up if he is interested in any of these inpatient or     outpatient services.  4. Hypokalemia.  This was likely secondary to the patient's nausea and     vomiting, and was very mild.  Potassium was repleted, and magnesium was     found  to be within normal limits.  5. HIV:  The patient was continued on triple therapy throughout his hospital     stay, without problems.  There were no signs or symptoms of acute     infection that could be related to the patient's syncope, either primary     HIV or infections associated with immunocompromised state.   FOLLOW-UP APPOINTMENTS:  The patient will follow up with Dr. Ninetta Lights in one  to two weeks for a hospital follow-up appointment.      Kerby Nora, MD                           Laurell Roof, M.D.    AB/MEDQ  D:  07/06/2003  T:  07/07/2003  Job:  [id number]   cc:   Lacretia Leigh. Ninetta Lights, M.D.  [address]  [place]  [place] [address]  Fax: [phone]

## 2010-10-17 NOTE — Discharge Summary (Signed)
[place]  Patient:    [name], [name] Visit Number: [id number] MRN: [id number]          Service Type: MED Location: [phone] Attending Physician:  Madaline Guthrie Dictated by:   Lendell Caprice, M.D. Admit Date:  05/25/2001 Discharge Date: 05/28/2001   CC:         Madaline Guthrie, M.D.  Lacretia Leigh. Ninetta Lights, M.D.   Discharge Summary  DISCHARGE DIAGNOSES: 1. Diarrhea, current hospitalization. 2. Depression. 3. Suicidal ideation resolved current hospitalization. 4. AIDS. 5. G6PD deficiency. 6. Genital herpes.  DISCHARGE MEDICATIONS: 1. Zoloft 100 mg p.o. q. day. 2. Viracept 750 mg p.o. t.i.d. 3. Combivir 1 tablet p.o. b.i.d. 4. Imodium 2 mg p.o. take 1 tablet after each lose stool with a maximum of 16    mg per day.  Use as needed for diarrhea.  FOLLOWUP:  The patient will be discharged to stay with his relatives.  His previous place of residence does not have heat and he has agreed to temporarily live with his relatives upon discharge.  He should report to the already scheduled ID appointment with Dr. Ninetta Lights on June 22, 2001.  At that point Dr. Ninetta Lights can address whether heart therapy should be continued with the current regimen or changed.  A CD4 count was not obtained during this admission.  CONSULTANTS:  None.  PROCEDURES AND DIAGNOSTIC STUDIES:  A chest x-ray was performed which showed no active disease.  HISTORY OF PRESENT ILLNESS:  The patient is a 45 year old African-American male with a history of HIV diagnosed in 43.  He presents to the emergency room complaining of nausea, vomiting and diarrhea with watery stools on and off for approximately 1 month.  He states that he has had these symptoms for approximately 6 months which coincides with him switching his heart therapy. The patient states that today while at his mothers house he started to feel hot and sweaty with headache.  He reports feeling faint but  denies any loss of consciousness or syncopal episodes.  He stood up to walk and reports near fainting episode.  He denies any seizure like activity nor do his family members which were present at the house.  He also denies any fecal or urinary incontinence.  He also described decreased appetite since September secondary to symptoms of depression after a breakup with his significant other.  He reports a weight loss of approximately 14 pounds since that time.  The patient also complains of continued cough but no coffee ground emesis, no dark sputum or hematemesis.  The patient denies any abdominal pain, nausea or vomiting currently.  LABORATORY DATA:  Labs on admission:  White count 6.5, hemoglobin 14.4, platelets 278, lipase 21, sodium 136, potassium 4.1, chloride 105, CO2 22, BUN 15, creatinine 1.2, glucose 102, AST 46, ALT 49, alk. phos. 69, total bilirubin 0.5, protein 7.2, albumin 3.8, calcium 9.5.  HOSPITAL COURSE: #1 - DIARRHEA:  The patient was admitted and evaluated for gastrointestinal disease.  The patients diarrhea is likely secondary to his medications. After significant stool studies were performed it was our thinking that this diarrhea was secondary to medications.  A C. difficile was obtained which was negative as well as ova and parasites and stool culture.  The stool culture results showed no salmonella, Shigella, Campylobacter ______.  Fecal white blood cells were also obtained that were negative and final.  The ova and parasites test could not be done due to the specimen not being in  the preservative, however, the patients diarrhea improved somewhat before discharge.  He was given Imodium to be taken on an outpatient basis.  His heart therapy was changed during his hospitalization.  His viracept dose was changed and his combivent dose was kept at the previous dose of 1 tab p.o. b.i.d.  Dr. Ninetta Lights will see the patient in followup at which time he can address further  change in heart therapy should the diarrhea persist.  The patient had only 1 lose stool in the 24 hours before discharge and was hemodynamically stable with no evidence of orthostatic hypotension.  The patients diet was advanced during his hospitalization.  He was eating well on the day of discharge.  #2 - AIDS HIV:  The patient will see Dr. Ninetta Lights on June 22, 2001, in an already scheduled visit.  At that time he can address therapy and will like check a CD4 count.  The patient has a history of CD4 count of 670 in August 2002 and a viral load that was undetectable to 1080.  #3 - DEPRESSION:  The patient was put on suicide precautions because on admission he reports feeling suicidal.  Psychiatry was called for consultation, however the patient was not found to be suicidal on further days in his hospitalization.  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 q. day.  He was in good spirits and no had no homicidal or suicidal ideation on the day of discharge.  DISCHARGE LABORATORY DATA:  Sodium 139, potassium 3.6, chloride 110, CO2 23, glucose 91, BUN 6, creatinine 0.9, calcium 9.4. Dictated by:   Lendell Caprice, M.D. Attending Physician:  Madaline Guthrie DD:  07/14/01 TD:  07/14/01 Job: [id number]

## 2010-10-17 NOTE — Procedures (Signed)
HISTORY:  This patient is a 45 year old gentleman with a history of multiple  syncopal events, history of HIV infection. The patient is being evaluated  for possible seizures.   EEG CLASSIFICATION:  Normal wake and sleep.   DESCRIPTION:  According to background rhythms, this recording consists of a  fairly well modulated medium amplitude alpha rhythm of 10 to 11 hertz, that  is reactive to eye open and closure. As the record progresses, the patient  spends the vast majority of the recording in a sleeping state. This sleep  appears to be a sedated sleep pattern with overlying beta frequency  activity. The patient has occasional K complexes sleep spindle seen during  the recording. A 6 to 7 hertz theta frequency background slowing is seen  during sleep and is bilaterally symmetric. Towards the end of the recording,  the patient is re-alerted, and photic stimulation is performed resulting in  excellent end bilaterally symmetric driving response. Hyperventilation was  not performed. At no time during the recording did there appear to be actual  spike, spike-wave discharges, or evidence of focal slowing. EKG monitor  reveals occasional premature ventricular complexes with a heart rate of 66.   IMPRESSION:  This is a normal EEG according to wake and sleeping state. No  evidence of ictal or intraictal discharges is seen. Overlying beta frequency  activity is suggestive of sedative type medications such as benzodiazepines.  Medications were not listed.    Marlan Palau, M.D.   ZOX:WRUE  D:  09/01/2003 13:55:36  T:  09/01/2003 15:07:22  Job #:  [id number]

## 2010-10-24 ENCOUNTER — Ambulatory Visit (INDEPENDENT_AMBULATORY_CARE_PROVIDER_SITE_OTHER): Payer: Medicare Other | Admitting: Adult Health

## 2010-10-24 ENCOUNTER — Encounter: Payer: Self-pay | Admitting: Adult Health

## 2010-10-24 DIAGNOSIS — B370 Candidal stomatitis: Secondary | ICD-10-CM | POA: Insufficient documentation

## 2010-10-24 DIAGNOSIS — F1910 Other psychoactive substance abuse, uncomplicated: Secondary | ICD-10-CM

## 2010-10-24 DIAGNOSIS — J039 Acute tonsillitis, unspecified: Secondary | ICD-10-CM

## 2010-10-24 MED ORDER — FLUCONAZOLE 100 MG PO TABS: 100.0000 mg | ORAL_TABLET | Freq: Every day | ORAL | Status: AC

## 2010-10-24 MED ORDER — AMOXICILLIN-POT CLAVULANATE 875-125 MG PO TABS: 1.0000 | ORAL_TABLET | Freq: Two times a day (BID) | ORAL | Status: AC

## 2010-10-24 NOTE — Progress Notes (Signed)
Subjective:    Patient ID: [name], male    DOB: [birth year], 45 y.o.   MRN: [id number]  HPI Presents to clinic with a one-week history of progressively worsening sore throat, with some difficulty swallowing, and also knot-like sensations in his chest. He states that he took some antibiotics that he was given 2 years ago for an episode of sore throat, which he did not finish, and claims he received good relief with it. States he felt feverish, but denies taking his temperature. No chills, no sweats, no cough, no shortness of breath. Also relates sporadic adherence to his antiretroviral regimen as result of his long-term drinking and forgetting to take his medications. He states he's not going to stop drinking so he doesn't know whether his meds will help him with his HIV.   Review of Systems  Constitutional: Positive for fever and fatigue. Negative for chills, diaphoresis, activity change, appetite change and unexpected weight change.       Strong aromatic odor noted to breath.  HENT: Positive for sore throat and trouble swallowing. Negative for voice change.   Eyes: Negative.   Respiratory: Negative.   Cardiovascular: Negative.   Gastrointestinal: Negative.   Genitourinary: Negative.   Musculoskeletal: Negative.   Skin: Negative.   Neurological: Negative.   Hematological: Negative.   Psychiatric/Behavioral: Negative.        Objective:   Physical Exam  Constitutional: He is oriented to person, place, and time. He appears well-developed and well-nourished.       Strong aromatic odor noted to breath.  HENT:  Head: Normocephalic and atraumatic.  Right Ear: External ear normal.  Left Ear: External ear normal.       White plaques noted, along the soft palate, posterior buccal mucosa, and in the posterior pharynx. Tonsils appear enlarged and inflamed, but no plaques or exudates noted, nor any peritonsillar abscess noted.  Eyes: Conjunctivae and EOM are normal. Pupils are equal,  round, and reactive to light. Right eye exhibits no discharge. Left eye exhibits no discharge.       Eyes have a "blood shot" appearance.  Neck: Normal range of motion. Neck supple.  Cardiovascular: Normal rate and regular rhythm.   Pulmonary/Chest: Effort normal and breath sounds normal.  Abdominal: Soft. Bowel sounds are normal.  Musculoskeletal: Normal range of motion.  Neurological: He is alert and oriented to person, place, and time. No cranial nerve deficit. He exhibits normal muscle tone. Coordination normal.  Skin: Skin is warm and dry.  Psychiatric: Judgment and thought content normal. His affect is angry and labile. His speech is rapid and/or pressured. He is agitated and is hyperactive. Cognition and memory are normal.          Assessment & Plan:  1. Tonsillitis. Augmentin 875 mg by mouth every 12 hours x10 days.  2. Oral Candidiasis. Given some of the symptoms. This might also extend into the esophagus. Therefore, will give fluconazole 100 mg by mouth daily x10 days.  3. HIV. He admitted nonadherence to his regimen for an extended period of time. I discussed with him the cause and effect relationship between nonadherence to treatment failure. While we are not certain at this point whether he has resistance to his regimen, we recommended that he try to be adherent to his medications for the time being and when he returns for his followup. We'll obtain a genotype to determine what resistance is present.  Although he was upset with the information that was provided to him. He  verbally acknowledged all information and agreed with plan of care. He already has a followup appointment on 11/03/1998.

## 2010-11-03 ENCOUNTER — Ambulatory Visit: Payer: Self-pay | Admitting: Adult Health

## 2010-11-05 ENCOUNTER — Telehealth: Payer: Self-pay | Admitting: *Deleted

## 2010-11-05 NOTE — Telephone Encounter (Signed)
States he woke up this am with tiny red bumps all over. They do not lookk like blisters. Present even on his ears. Took 2 benadryl and has no pain or itching. Finished his amox & diflucan from 10/24/10. Wants more benadryl called in. Told him I am going to check with a doctor & will call him back [phone]

## 2010-11-05 NOTE — Telephone Encounter (Signed)
Called benadryl 25mg  #30 with 2 refills to his pharmacy per NP orders. Called pt & told him this. Advised if not fading in 2-3 days, make appt to be seen. He agreed

## 2010-11-06 ENCOUNTER — Emergency Department (HOSPITAL_COMMUNITY)
Admission: EM | Admit: 2010-11-06 | Discharge: 2010-11-06 | Disposition: A | Discharge status: Home / Self Care | Payer: Medicare Other | Attending: Emergency Medicine | Admitting: Emergency Medicine

## 2010-11-06 DIAGNOSIS — Z79899 Other long term (current) drug therapy: Secondary | ICD-10-CM | POA: Insufficient documentation

## 2010-11-06 DIAGNOSIS — R21 Rash and other nonspecific skin eruption: Secondary | ICD-10-CM | POA: Insufficient documentation

## 2010-11-06 DIAGNOSIS — Z21 Asymptomatic human immunodeficiency virus [HIV] infection status: Secondary | ICD-10-CM | POA: Insufficient documentation

## 2010-11-06 DIAGNOSIS — I10 Essential (primary) hypertension: Secondary | ICD-10-CM | POA: Insufficient documentation

## 2010-11-06 DIAGNOSIS — L509 Urticaria, unspecified: Secondary | ICD-10-CM | POA: Insufficient documentation

## 2010-11-11 ENCOUNTER — Ambulatory Visit (INDEPENDENT_AMBULATORY_CARE_PROVIDER_SITE_OTHER): Payer: Medicare Other | Admitting: Internal Medicine

## 2010-11-11 ENCOUNTER — Encounter: Payer: Self-pay | Admitting: Internal Medicine

## 2010-11-11 ENCOUNTER — Other Ambulatory Visit: Payer: Self-pay | Admitting: *Deleted

## 2010-11-11 VITALS — BP 145/108 | HR 88 | Temp 98.1°F | Ht 64.5 in | Wt 149.0 lb

## 2010-11-11 DIAGNOSIS — B1710 Acute hepatitis C without hepatic coma: Secondary | ICD-10-CM

## 2010-11-11 DIAGNOSIS — B20 Human immunodeficiency virus [HIV] disease: Secondary | ICD-10-CM

## 2010-11-11 DIAGNOSIS — B1920 Unspecified viral hepatitis C without hepatic coma: Secondary | ICD-10-CM

## 2010-11-11 DIAGNOSIS — Z23 Encounter for immunization: Secondary | ICD-10-CM

## 2010-11-11 MED ORDER — DARUNAVIR ETHANOLATE 400 MG PO TABS: 800.0000 mg | ORAL_TABLET | Freq: Every day | ORAL | Status: DC

## 2010-11-11 MED ORDER — RITONAVIR 100 MG PO CAPS: 100.0000 mg | ORAL_CAPSULE | Freq: Every day | ORAL | Status: DC

## 2010-11-11 MED ORDER — EMTRICITABINE-TENOFOVIR DF 200-300 MG PO TABS: 1.0000 | ORAL_TABLET | Freq: Every day | ORAL | Status: DC

## 2010-11-11 NOTE — Assessment & Plan Note (Signed)
Recheck RNA, counselled extensively on alcohol abstention, pt though reports he won't stop.  Counselled on effects with hep c.

## 2010-11-11 NOTE — Assessment & Plan Note (Addendum)
CD4 and viral load not appropriate.  He takes his Isentress inappropriatetly once a day.  I am concerned about side effects and resistance.  His history is of viramune.  I will start him on a once a day regimen of darunavir boosted with Truvada.  Recheck labs today and in 6 weeks with new med.    CD4 noted, needs to start Bactrim PCP prophylaxis.

## 2010-11-11 NOTE — Progress Notes (Signed)
Addended by: Wendall Mola A on: 11/11/2010 10:25 AM   Modules accepted: Orders

## 2010-11-11 NOTE — Progress Notes (Signed)
  Subjective:    Patient ID: [name], male    DOB: 1965/09/25, 45 y.o.   MRN: [id number]  HPI 45 year old male with HIV x 14 years and intermittent compliance with his meds.  Cu rrently on Truvada and Issentress but takes both doses of Isentress at once with Truvada.  States he misses meds due to alcohol use.  Recent rash but is resolving, diagnosed as contact derm in ED, responded to steroids.  Drinks daily and states he will not give that up.  History of renal cyst on U/S and appeared benign.      Review of Systems  Constitutional: Negative.   HENT: Negative.   Eyes: Negative.   Respiratory: Negative.   Cardiovascular: Negative.   Gastrointestinal: Negative.   Genitourinary: Negative.   Musculoskeletal: Negative.   Skin: Negative.   Neurological: Negative.   Hematological: Negative.   Psychiatric/Behavioral: Negative.        Objective:   Physical Exam  Constitutional: He is oriented to person, place, and time. He appears well-developed and well-nourished. No distress.  HENT:  Mouth/Throat: No oropharyngeal exudate.  Eyes: Left eye exhibits no discharge. No scleral icterus.  Neck: Normal range of motion. Neck supple.  Cardiovascular: Normal rate, regular rhythm, normal heart sounds and intact distal pulses.  Exam reveals no gallop and no friction rub.   No murmur heard. Pulmonary/Chest: Effort normal and breath sounds normal. No respiratory distress. He has no wheezes.  Abdominal: Soft. Bowel sounds are normal. There is no tenderness. There is no rebound.       Mild distension  Lymphadenopathy:    He has no cervical adenopathy.  Neurological: He is alert and oriented to person, place, and time. No cranial nerve deficit.  Skin: Skin is warm and dry. No rash noted. No erythema.  Psychiatric: He has a normal mood and affect. His behavior is normal.          Assessment & Plan:

## 2010-11-12 LAB — CBC WITH DIFFERENTIAL/PLATELET
Basophils Absolute: 0 10*3/uL (ref 0.0–0.1)
Basophils Relative: 0 % (ref 0–1)
Eosinophils Relative: 1 % (ref 0–5)
HCT: 44 % (ref 39.0–52.0)
MCHC: 32.3 g/dL (ref 30.0–36.0)
Monocytes Absolute: 0.6 10*3/uL (ref 0.1–1.0)
Neutro Abs: 5.4 10*3/uL (ref 1.7–7.7)
RDW: 13.1 % (ref 11.5–15.5)

## 2010-11-12 LAB — COMPLETE METABOLIC PANEL WITH GFR
Albumin: 3.8 g/dL (ref 3.5–5.2)
Alkaline Phosphatase: 63 U/L (ref 39–117)
CO2: 27 mEq/L (ref 19–32)
Chloride: 104 mEq/L (ref 96–112)
GFR, Est Non African American: >60 mL/min (ref >60)
Glucose, Bld: 84 mg/dL (ref 70–99)
Potassium: 4.1 mEq/L (ref 3.5–5.3)
Sodium: 141 mEq/L (ref 135–145)
Total Protein: 8 g/dL (ref 6.0–8.3)

## 2010-11-12 LAB — HIV-1 RNA ULTRAQUANT REFLEX TO GENTYP+: HIV-1 RNA Quant, Log: 4.57 {Log} — ABNORMAL HIGH (ref <1.30)

## 2010-11-12 MED ORDER — SULFAMETHOXAZOLE-TMP DS 800-160 MG PO TABS: 1.0000 | ORAL_TABLET | Freq: Every day | ORAL | Status: DC

## 2010-11-12 NOTE — Progress Notes (Signed)
Addended by: [name] on: 11/12/2010 01:02 PM   Modules accepted: Orders

## 2010-11-13 ENCOUNTER — Other Ambulatory Visit: Payer: Self-pay | Admitting: *Deleted

## 2010-11-13 DIAGNOSIS — B20 Human immunodeficiency virus [HIV] disease: Secondary | ICD-10-CM

## 2010-11-13 MED ORDER — DAPSONE 100 MG PO TABS: 100.0000 mg | ORAL_TABLET | Freq: Every day | ORAL | Status: AC

## 2010-11-13 MED ORDER — DAPSONE 100 MG PO TABS: 100.0000 mg | ORAL_TABLET | Freq: Every day | ORAL | Status: DC

## 2010-11-13 NOTE — Progress Notes (Signed)
Addended by: [name] on: 11/13/2010 08:54 AM   Modules accepted: Orders

## 2010-11-27 ENCOUNTER — Other Ambulatory Visit (INDEPENDENT_AMBULATORY_CARE_PROVIDER_SITE_OTHER): Payer: Medicare Other | Admitting: Internal Medicine

## 2010-11-27 DIAGNOSIS — B20 Human immunodeficiency virus [HIV] disease: Secondary | ICD-10-CM

## 2010-11-27 DIAGNOSIS — Z21 Asymptomatic human immunodeficiency virus [HIV] infection status: Secondary | ICD-10-CM

## 2010-11-27 MED ORDER — RALTEGRAVIR POTASSIUM 400 MG PO TABS: 400.0000 mg | ORAL_TABLET | Freq: Two times a day (BID) | ORAL | Status: DC

## 2010-11-27 NOTE — Progress Notes (Signed)
Addended by: [name] on: 11/27/2010 10:27 AM   Modules accepted: Orders

## 2010-12-01 ENCOUNTER — Other Ambulatory Visit: Payer: Self-pay | Admitting: *Deleted

## 2010-12-01 DIAGNOSIS — I10 Essential (primary) hypertension: Secondary | ICD-10-CM

## 2010-12-01 MED ORDER — HYDROCHLOROTHIAZIDE 25 MG PO TABS: 25.0000 mg | ORAL_TABLET | Freq: Every day | ORAL | Status: DC

## 2010-12-01 MED ORDER — LISINOPRIL 40 MG PO TABS: 40.0000 mg | ORAL_TABLET | Freq: Every day | ORAL | Status: DC

## 2010-12-04 DIAGNOSIS — B20 Human immunodeficiency virus [HIV] disease: Secondary | ICD-10-CM

## 2010-12-05 ENCOUNTER — Other Ambulatory Visit (INDEPENDENT_AMBULATORY_CARE_PROVIDER_SITE_OTHER): Payer: Medicare Other

## 2010-12-05 DIAGNOSIS — B20 Human immunodeficiency virus [HIV] disease: Secondary | ICD-10-CM

## 2010-12-05 LAB — T-HELPER CELL (CD4) - (RCID CLINIC ONLY)
CD4 % Helper T Cell: 11 % — ABNORMAL LOW (ref 33–55)
CD4 T Cell Abs: 220 uL — ABNORMAL LOW (ref 400–2700)

## 2010-12-16 LAB — HLA B*5701

## 2010-12-23 ENCOUNTER — Encounter: Payer: Self-pay | Admitting: Internal Medicine

## 2010-12-23 ENCOUNTER — Ambulatory Visit (INDEPENDENT_AMBULATORY_CARE_PROVIDER_SITE_OTHER): Payer: Medicare Other | Admitting: Internal Medicine

## 2010-12-23 VITALS — BP 130/90 | HR 85 | Temp 98.4°F | Ht 64.5 in | Wt 154.0 lb

## 2010-12-23 DIAGNOSIS — B1920 Unspecified viral hepatitis C without hepatic coma: Secondary | ICD-10-CM

## 2010-12-23 DIAGNOSIS — B1710 Acute hepatitis C without hepatic coma: Secondary | ICD-10-CM

## 2010-12-23 DIAGNOSIS — B20 Human immunodeficiency virus [HIV] disease: Secondary | ICD-10-CM

## 2010-12-23 NOTE — Assessment & Plan Note (Signed)
Viral load checked and discussed with the patient.  I emphasized that he needs to stop drinking first and that there are treatment options for hep c if he ca n remain abstinent long enough. At this time he is not interested in quiting drinking or in treatment but I will check u/s of liver to see if there are any signs of cirrhosis for prognosis and liver screening.

## 2010-12-23 NOTE — Assessment & Plan Note (Addendum)
Patient now seems to be concerned with compliance and is doing well.  He is tolerating the meds and taking Isentress bid as prescribed.  He appears very motivated.  I did discuss his resistance mutations and the implications and he is aware that he may not have many other optimal choices if he fails this regimen.  Today he is happy to be eating well and gaining weight.  Will follow up in 2 months to assure continued compliance.   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  Patient to be made aware and to replace it with etravirine and continue darunavir boosted and Truvada.  Patient now with archived 184V, 103N in addition to that.  Follow up labs in 1 month.

## 2010-12-23 NOTE — Progress Notes (Signed)
  Subjective:    Patient ID: [name], male    DOB: 11/22/1965, 45 y.o.   MRN: [id number]  HPI here for follow up for HIV and Hep C.  Recently developed 184V resistance and regimen changed back to include Isentress, which he previously was only taking daily.  He now reports 100% compliance since his last visit.  He states that he understands well his prognosis if he does not remain compliance and in fact recently lost someone close due to non-compliance.  Today he has no complaints and is happy to have gained 5 lbs.      Review of Systems  Constitutional: Negative.   HENT: Negative.   Eyes: Negative.   Respiratory: Negative.   Cardiovascular: Negative.   Gastrointestinal: Positive for diarrhea.       Minimal diarrhea  Genitourinary: Negative.   Musculoskeletal: Negative.   Skin: Negative.   Neurological: Negative.   Hematological: Negative.   Psychiatric/Behavioral: Negative.        Objective:   Physical Exam  Constitutional: He is oriented to person, place, and time. He appears well-developed and well-nourished. No distress.  HENT:  Mouth/Throat: No oropharyngeal exudate.  Neck: Normal range of motion. Neck supple.  Cardiovascular: Normal rate, regular rhythm and normal heart sounds.  Exam reveals no gallop and no friction rub.   No murmur heard. Pulmonary/Chest: Effort normal and breath sounds normal. No respiratory distress. He has no wheezes.  Abdominal: Soft. Bowel sounds are normal. He exhibits no distension. There is no tenderness.  Lymphadenopathy:    He has no cervical adenopathy.  Neurological: He is alert and oriented to person, place, and time.  Skin: Skin is warm and dry. No erythema.  Psychiatric: He has a normal mood and affect. His behavior is normal.          Assessment & Plan:

## 2010-12-30 ENCOUNTER — Other Ambulatory Visit: Payer: Self-pay | Admitting: Internal Medicine

## 2010-12-30 ENCOUNTER — Other Ambulatory Visit: Payer: Self-pay | Admitting: *Deleted

## 2010-12-30 DIAGNOSIS — B20 Human immunodeficiency virus [HIV] disease: Secondary | ICD-10-CM

## 2010-12-30 MED ORDER — ETRAVIRINE 200 MG PO TABS
200.0000 mg | ORAL_TABLET | Freq: Two times a day (BID) | ORAL | Status: DC
Start: 2010-12-30 — End: 2011-11-30

## 2010-12-30 NOTE — Progress Notes (Signed)
Addended by: [name] on: 12/30/2010 02:06 PM   Modules accepted: Orders

## 2011-01-01 ENCOUNTER — Ambulatory Visit (HOSPITAL_COMMUNITY)
Admission: RE | Admit: 2011-01-01 | Discharge: 2011-01-01 | Disposition: A | Discharge status: Home / Self Care | Payer: Medicare Other | Source: Ambulatory Visit | Attending: Internal Medicine | Admitting: Internal Medicine

## 2011-01-01 DIAGNOSIS — N281 Cyst of kidney, acquired: Secondary | ICD-10-CM | POA: Insufficient documentation

## 2011-01-01 DIAGNOSIS — B1920 Unspecified viral hepatitis C without hepatic coma: Secondary | ICD-10-CM | POA: Insufficient documentation

## 2011-01-19 ENCOUNTER — Other Ambulatory Visit: Payer: Medicare Other

## 2011-01-19 ENCOUNTER — Other Ambulatory Visit: Payer: Self-pay | Admitting: Internal Medicine

## 2011-01-19 DIAGNOSIS — B20 Human immunodeficiency virus [HIV] disease: Secondary | ICD-10-CM

## 2011-01-20 LAB — HIV-1 RNA QUANT-NO REFLEX-BLD
HIV 1 RNA Quant: <20 copies/mL (ref <20)
HIV-1 RNA Quant, Log: <1.3 {Log} (ref <1.30)

## 2011-01-20 LAB — T-HELPER CELL (CD4) - (RCID CLINIC ONLY): CD4 % Helper T Cell: 14 % — ABNORMAL LOW (ref 33–55)

## 2011-02-19 ENCOUNTER — Ambulatory Visit: Payer: Medicare Other | Admitting: Adult Health

## 2011-02-19 ENCOUNTER — Ambulatory Visit (INDEPENDENT_AMBULATORY_CARE_PROVIDER_SITE_OTHER): Payer: Medicare Other | Admitting: Internal Medicine

## 2011-02-19 ENCOUNTER — Encounter: Payer: Self-pay | Admitting: Internal Medicine

## 2011-02-19 VITALS — BP 134/92 | HR 91 | Temp 98.5°F | Wt 155.8 lb

## 2011-02-19 DIAGNOSIS — Z0000 Encounter for general adult medical examination without abnormal findings: Secondary | ICD-10-CM

## 2011-02-19 DIAGNOSIS — B20 Human immunodeficiency virus [HIV] disease: Secondary | ICD-10-CM

## 2011-02-19 DIAGNOSIS — Z23 Encounter for immunization: Secondary | ICD-10-CM

## 2011-02-19 NOTE — Assessment & Plan Note (Signed)
Though he does have a long history of poor compliance, he now appears to be doing very well and this is supported by his increasing CD4 T-cell count as well as his undetectable viral load. His current salvage regimen therefore seems to be working and he is tolerating it well and is very pleased with the results. His overall lifestyle is improving with decreased alcohol use. He feels very well and is hopeful for continuing to take his medicines as he is. His T-cell count is over 200 still and therefore no indication for PCP prophylaxis. I did discuss the continued need to quit drinking 2 to his liver disease and he did acknowledge the need to quit drinking and is significantly reducing his intake. I did discuss with him the long-term benefits of anti-retroviral therapy and the need for exercise and good diet. He reports understanding of that. At this time I am pleased with his progress and we'll have him followup in 3 months time to assure continued compliance and good immune reconstitution. The patient was instructed to call if he has any issues in the meantime.  In regards to his hepatitis, I did encourage continued alcohol abstinence and discussed the risks of cirrhosis.

## 2011-02-19 NOTE — Progress Notes (Signed)
  Subjective:    Patient ID: [name], male    DOB: 1966-04-12, 45 y.o.   MRN: [id number]  HPI he returns for followup for his HIV. He was recently changed to etravirine do to resistance Isentress. He does have a long history of poor compliance however since he started his etravirine he has been very compliant reporting 100% of his doses. He feels welland in fact is going back to work. He also reports he has significantly decreased his alcohol consumption as well as marijuana use and his mood is significantly improved.his most recent viral load is undetectable and he is very pleased with this. He reports no weight loss, dysphasia, shortness of breath or diarrhea.    Review of Systems  Constitutional: Negative.   HENT: Negative.   Eyes: Negative.   Respiratory: Negative.   Cardiovascular: Negative.   Gastrointestinal: Negative.   Genitourinary: Negative.   Musculoskeletal: Negative.   Skin: Negative.   Neurological: Negative.   Hematological: Negative.   Psychiatric/Behavioral: Negative.        Objective:   Physical Exam  Constitutional: He is oriented to person, place, and time. He appears well-developed and well-nourished.  Eyes: No scleral icterus.  Cardiovascular: Normal rate, regular rhythm and normal heart sounds.   No murmur heard. Pulmonary/Chest: Effort normal and breath sounds normal. He has no wheezes.  Abdominal: Soft. Bowel sounds are normal. There is no tenderness.  Lymphadenopathy:    He has no cervical adenopathy.  Neurological: He is alert and oriented to person, place, and time.  Skin: Skin is warm and dry. No rash noted. No erythema.  Psychiatric: He has a normal mood and affect. His behavior is normal.          Assessment & Plan:

## 2011-02-26 LAB — COMPREHENSIVE METABOLIC PANEL
Albumin: 3.6
Alkaline Phosphatase: 76
BUN: 32 — ABNORMAL HIGH
Calcium: 9.2
Glucose, Bld: 115 — ABNORMAL HIGH
Potassium: 3.9
Total Protein: 8

## 2011-02-26 LAB — TYPE AND SCREEN
ABO/RH(D): O NEG
Antibody Screen: NEGATIVE

## 2011-02-26 LAB — CULTURE, RESPIRATORY W GRAM STAIN: Culture: NORMAL

## 2011-02-26 LAB — INFLUENZA A+B VIRUS AG-DIRECT(RAPID): Inflenza A Ag: NEGATIVE

## 2011-02-26 LAB — CBC
HCT: 39.6
Hemoglobin: 13.4
MCHC: 33.9
Platelets: 352
RDW: 12.1

## 2011-02-26 LAB — POCT I-STAT, CHEM 8
Calcium, Ion: 1.01 — ABNORMAL LOW
Chloride: 111
Glucose, Bld: 122 — ABNORMAL HIGH
HCT: 42
TCO2: 23

## 2011-02-26 LAB — STREP A DNA PROBE

## 2011-02-26 LAB — FUNGUS CULTURE W SMEAR: Fungal Smear: NONE SEEN

## 2011-02-26 LAB — DIFFERENTIAL
Lymphocytes Relative: 12
Lymphs Abs: 1.3
Monocytes Absolute: 0.7
Monocytes Relative: 6
Neutro Abs: 8.7 — ABNORMAL HIGH
Neutrophils Relative %: 81 — ABNORMAL HIGH

## 2011-02-26 LAB — OCCULT BLOOD X 1 CARD TO LAB, STOOL: Fecal Occult Bld: POSITIVE

## 2011-02-26 LAB — ABO/RH: ABO/RH(D): O NEG

## 2011-02-26 LAB — RAPID STREP SCREEN (MED CTR MEBANE ONLY): Streptococcus, Group A Screen (Direct): NEGATIVE

## 2011-03-02 LAB — T-HELPER CELL (CD4) - (RCID CLINIC ONLY)
CD4 % Helper T Cell: 11 — ABNORMAL LOW
CD4 T Cell Abs: 200 — ABNORMAL LOW

## 2011-03-03 ENCOUNTER — Other Ambulatory Visit: Payer: Self-pay | Admitting: *Deleted

## 2011-03-03 DIAGNOSIS — I10 Essential (primary) hypertension: Secondary | ICD-10-CM

## 2011-03-03 MED ORDER — CLONIDINE HCL 0.2 MG PO TABS
0.2000 mg | ORAL_TABLET | Freq: Two times a day (BID) | ORAL | Status: DC
Start: 2011-03-03 — End: 2011-11-30

## 2011-03-12 LAB — T-HELPER CELL (CD4) - (RCID CLINIC ONLY): CD4 T Cell Abs: 390 — ABNORMAL LOW

## 2011-04-15 ENCOUNTER — Other Ambulatory Visit: Payer: Self-pay | Admitting: *Deleted

## 2011-04-15 DIAGNOSIS — I10 Essential (primary) hypertension: Secondary | ICD-10-CM

## 2011-04-15 MED ORDER — LISINOPRIL 40 MG PO TABS: 40.0000 mg | ORAL_TABLET | Freq: Every day | ORAL | Status: DC

## 2011-04-15 MED ORDER — HYDROCHLOROTHIAZIDE 25 MG PO TABS: 25.0000 mg | ORAL_TABLET | Freq: Every day | ORAL | Status: DC

## 2011-05-05 ENCOUNTER — Telehealth: Payer: Self-pay | Admitting: *Deleted

## 2011-05-05 NOTE — Telephone Encounter (Signed)
States he needs a letter stating the TB test he had done on 11/11/10 was negative.

## 2011-05-05 NOTE — Telephone Encounter (Signed)
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 Wendall Mola CMA

## 2011-05-07 ENCOUNTER — Telehealth: Payer: Self-pay | Admitting: *Deleted

## 2011-05-07 NOTE — Telephone Encounter (Signed)
Pt had left a message asking [place] to fax a copy of his flu shot to [facility] [phone]. I did it & rec;d the confirmation

## 2011-06-11 ENCOUNTER — Telehealth: Payer: Self-pay | Admitting: *Deleted

## 2011-06-11 ENCOUNTER — Other Ambulatory Visit: Payer: Self-pay | Admitting: Infectious Disease

## 2011-06-11 ENCOUNTER — Other Ambulatory Visit (INDEPENDENT_AMBULATORY_CARE_PROVIDER_SITE_OTHER): Payer: Medicare Other

## 2011-06-11 DIAGNOSIS — B20 Human immunodeficiency virus [HIV] disease: Secondary | ICD-10-CM | POA: Diagnosis not present

## 2011-06-11 MED ORDER — OSELTAMIVIR PHOSPHATE 75 MG PO CAPS: 75.0000 mg | ORAL_CAPSULE | Freq: Two times a day (BID) | ORAL | Status: AC

## 2011-06-11 NOTE — Telephone Encounter (Signed)
States he has a cough, sore throat & does not feel well. Has been around others that have the flu. He did get the flu shot. Wants antibiotics. Told him he needed to be seen. He does not have a primary md. Has both medicare & medicaid. He refuses to get another md. Wants all care here. We have no appts left for today. Told him I will ask the md if he will write a script w/o an examination. Dr. Luciana Axe asked me to send in tamiflu 75mg  po bid. I did & explained what the med was & what to expect. He was satisfied

## 2011-06-12 LAB — CBC WITH DIFFERENTIAL/PLATELET
Hemoglobin: 12.6 g/dL — ABNORMAL LOW (ref 13.0–17.0)
Lymphocytes Relative: 20 % (ref 12–46)
Lymphs Abs: 1.3 10*3/uL (ref 0.7–4.0)
Neutro Abs: 4.3 10*3/uL (ref 1.7–7.7)
Neutrophils Relative %: 67 % (ref 43–77)
Platelets: 226 10*3/uL (ref 150–400)
RBC: 4.34 MIL/uL (ref 4.22–5.81)
WBC: 6.4 10*3/uL (ref 4.0–10.5)

## 2011-06-12 LAB — COMPLETE METABOLIC PANEL WITH GFR
BUN: 13 mg/dL (ref 6–23)
CO2: 25 mEq/L (ref 19–32)
Calcium: 8.7 mg/dL (ref 8.4–10.5)
Chloride: 103 mEq/L (ref 96–112)
Creat: 1.12 mg/dL (ref 0.50–1.35)
GFR, Est African American: >89 mL/min
Total Bilirubin: 0.5 mg/dL (ref 0.3–1.2)

## 2011-06-12 LAB — T-HELPER CELL (CD4) - (RCID CLINIC ONLY)
CD4 % Helper T Cell: 17 % — ABNORMAL LOW (ref 33–55)
CD4 T Cell Abs: 210 uL — ABNORMAL LOW (ref 400–2700)

## 2011-06-15 LAB — HIV-1 RNA QUANT-NO REFLEX-BLD: HIV 1 RNA Quant: <20 copies/mL (ref <20)

## 2011-06-25 ENCOUNTER — Telehealth: Payer: Self-pay | Admitting: *Deleted

## 2011-06-25 ENCOUNTER — Ambulatory Visit: Payer: Medicare Other | Admitting: Internal Medicine

## 2011-06-25 NOTE — Telephone Encounter (Signed)
He called to cancel his appt.

## 2011-07-02 ENCOUNTER — Ambulatory Visit (INDEPENDENT_AMBULATORY_CARE_PROVIDER_SITE_OTHER): Payer: Medicare Other | Admitting: Internal Medicine

## 2011-07-02 ENCOUNTER — Encounter: Payer: Self-pay | Admitting: Internal Medicine

## 2011-07-02 DIAGNOSIS — I10 Essential (primary) hypertension: Secondary | ICD-10-CM

## 2011-07-02 DIAGNOSIS — B20 Human immunodeficiency virus [HIV] disease: Secondary | ICD-10-CM

## 2011-07-02 NOTE — Patient Instructions (Signed)
Stop dapsone and HCTZ - hydrochlorothiazide

## 2011-07-03 ENCOUNTER — Encounter: Payer: Self-pay | Admitting: Internal Medicine

## 2011-07-03 NOTE — Assessment & Plan Note (Signed)
He has continued over the last 6 months to have excellent compliance with this regimen and very good understanding of the need to continue with his medication. He is pleased with the progress and in fact has been over 200 with his CD4 cell count and therefore I did tell him he can stop the dapsone today. He continues to be motivated to continue to take his antiretroviral therapy and will followup in 4 months time. He was reminded to use condoms with all sexual activity.

## 2011-07-03 NOTE — Progress Notes (Signed)
  Subjective:    Patient ID: [name], male    DOB: [birth year], 46 y.o.   MRN: [id number]  HPI He comes in for followup of his HIV. He has been on a salvage regimen with Etravirine, Prezista, Norvir and Truvada for about 6 months and has continued to have excellent adherence and tolerance with the medication. He has continued to take dapsone as well for prophylaxis. He today has no specific complaints. He did have recent flulike symptoms which were relieved after starting Tamiflu. He feels he is in a good place with good social support. He does continue to drink alcohol and does understand the consequences of alcohol in someone with active hepatitis C.   Review of Systems  Constitutional: Negative for fever, fatigue and unexpected weight change.  HENT: Negative for sore throat and trouble swallowing.   Respiratory: Negative for cough, choking and shortness of breath.   Cardiovascular: Negative for chest pain, palpitations and leg swelling.  Gastrointestinal: Negative for nausea, vomiting, abdominal pain and constipation.  Genitourinary: Negative for discharge, genital sores and penile pain.  Musculoskeletal: Negative for myalgias and arthralgias.  Skin: Negative for pallor and rash.  Neurological: Negative for dizziness, tremors, syncope, light-headedness and headaches.  Hematological: Negative for adenopathy.  Psychiatric/Behavioral: Negative for dysphoric mood. The patient is not nervous/anxious.        Objective:   Physical Exam  Constitutional: He is oriented to person, place, and time. He appears well-developed and well-nourished. No distress.  HENT:  Mouth/Throat: Oropharynx is clear and moist. No oropharyngeal exudate.  Cardiovascular: Normal rate, regular rhythm and normal heart sounds.  Exam reveals no gallop and no friction rub.   No murmur heard. Pulmonary/Chest: Effort normal and breath sounds normal. No respiratory distress. He has no wheezes. He has no rales.    Abdominal: Soft. Bowel sounds are normal. He exhibits no distension. There is no tenderness. There is no rebound.  Lymphadenopathy:    He has no cervical adenopathy.  Neurological: He is alert and oriented to person, place, and time.  Skin: Skin is warm and dry. No rash noted. No erythema.  Psychiatric: He has a normal mood and affect. His behavior is normal. Judgment and thought content normal.          Assessment & Plan:

## 2011-07-03 NOTE — Assessment & Plan Note (Signed)
His blood pressure is actually significantly low today and after repeat manually it was persistently low. Typically he has run high even on the medication. I do suspect though that he has been taking better care of himself including limiting things such as salt intake. Therefore to avoid too low blood pressure, I have had him stop his hydrochlorothiazide for now and he will have his blood pressure rechecked in one week. If he remains normotensive or low, he will continue with out that. However if he is again hypertensive, he will restart the hydrochlorothiazide and continue that with his clonidine and lisinopril.

## 2011-08-18 ENCOUNTER — Ambulatory Visit: Payer: Medicare Other | Admitting: Infectious Disease

## 2011-08-21 ENCOUNTER — Encounter: Payer: Medicare Other | Admitting: Infectious Disease

## 2011-09-01 ENCOUNTER — Other Ambulatory Visit: Payer: Medicare Other

## 2011-09-01 DIAGNOSIS — B20 Human immunodeficiency virus [HIV] disease: Secondary | ICD-10-CM | POA: Diagnosis not present

## 2011-09-01 LAB — COMPREHENSIVE METABOLIC PANEL
ALT: 26 U/L (ref 0–53)
CO2: 25 mEq/L (ref 19–32)
Calcium: 9.5 mg/dL (ref 8.4–10.5)
Chloride: 110 mEq/L (ref 96–112)
Glucose, Bld: 98 mg/dL (ref 70–99)
Sodium: 143 mEq/L (ref 135–145)
Total Bilirubin: 0.5 mg/dL (ref 0.3–1.2)
Total Protein: 8.1 g/dL (ref 6.0–8.3)

## 2011-09-01 LAB — CBC WITH DIFFERENTIAL/PLATELET
Eosinophils Absolute: 0.5 10*3/uL (ref 0.0–0.7)
Eosinophils Relative: 5 % (ref 0–5)
HCT: 43.5 % (ref 39.0–52.0)
MCHC: 32.6 g/dL (ref 30.0–36.0)
Neutro Abs: 4.1 10*3/uL (ref 1.7–7.7)
Platelets: 257 10*3/uL (ref 150–400)
RDW: 13.1 % (ref 11.5–15.5)

## 2011-09-02 LAB — T-HELPER CELL (CD4) - (RCID CLINIC ONLY)
CD4 % Helper T Cell: 16 % — ABNORMAL LOW (ref 33–55)
CD4 T Cell Abs: 500 uL (ref 400–2700)

## 2011-09-03 LAB — HIV-1 RNA QUANT-NO REFLEX-BLD
HIV 1 RNA Quant: <20 copies/mL (ref <20)
HIV-1 RNA Quant, Log: <1.3 {Log} (ref <1.30)

## 2011-09-15 ENCOUNTER — Encounter: Payer: Self-pay | Admitting: Internal Medicine

## 2011-09-15 ENCOUNTER — Ambulatory Visit (INDEPENDENT_AMBULATORY_CARE_PROVIDER_SITE_OTHER): Payer: Medicare Other | Admitting: Internal Medicine

## 2011-09-15 VITALS — BP 137/99 | HR 91 | Temp 98.3°F | Ht 64.0 in | Wt 155.0 lb

## 2011-09-15 DIAGNOSIS — I10 Essential (primary) hypertension: Secondary | ICD-10-CM | POA: Diagnosis not present

## 2011-09-15 DIAGNOSIS — B20 Human immunodeficiency virus [HIV] disease: Secondary | ICD-10-CM

## 2011-09-15 DIAGNOSIS — Z113 Encounter for screening for infections with a predominantly sexual mode of transmission: Secondary | ICD-10-CM

## 2011-09-15 DIAGNOSIS — B1710 Acute hepatitis C without hepatic coma: Secondary | ICD-10-CM | POA: Diagnosis not present

## 2011-09-15 DIAGNOSIS — Z79899 Other long term (current) drug therapy: Secondary | ICD-10-CM | POA: Diagnosis not present

## 2011-09-15 NOTE — Assessment & Plan Note (Signed)
He continues to do well with his current regimen. He is tolerating well. He was reminded to use condoms with all sexual activity. Since he is doing well, he will return in 6 months time however he was reminded to come to clinic if he has any problems in the meantime.

## 2011-09-15 NOTE — Assessment & Plan Note (Signed)
I again reminded him that completely quitting alcohol would be optimal for his liver, however I did congratulate him on continued reduction of his alcohol intake.

## 2011-09-15 NOTE — Assessment & Plan Note (Signed)
His blood pressure is a little elevated today. I will continue to monitor this and have him continue to reduce his salt intake. He may need pharmacotherapy if remains elevated next visit.

## 2011-09-15 NOTE — Progress Notes (Signed)
  Subjective:    Patient ID: [name], male    DOB: Jul 13, 1965, 46 y.o.   MRN: [id number]  HPI He comes in today for his routine followup. He continues to be on a salvage regimen with etravirine, boosted Prezista, and Truvada. He continues to report 100% compliance and excellent tolerance of the regimen. He denies any missed doses. He has begun working as well full time and has significantly decreased his beer intake. He now does not drink daily in fact even when he does drink it is much less. He continues to feel well and is pleased with his current health.   Review of Systems  Constitutional: Negative for fever, chills, fatigue and unexpected weight change.  HENT: Negative for sore throat and trouble swallowing.   Respiratory: Negative for chest tightness, shortness of breath and wheezing.   Cardiovascular: Negative for chest pain, palpitations and leg swelling.  Gastrointestinal: Negative for nausea, abdominal pain and diarrhea.  Genitourinary: Negative for discharge, genital sores and penile pain.  Musculoskeletal: Negative for myalgias, joint swelling and arthralgias.  Skin: Negative for pallor and rash.  Neurological: Negative for dizziness and headaches.  Hematological: Negative for adenopathy.  Psychiatric/Behavioral: Negative for dysphoric mood. The patient is not nervous/anxious.        Objective:   Physical Exam  Constitutional: He appears well-developed and well-nourished. No distress.  HENT:  Mouth/Throat: Oropharynx is clear and moist. No oropharyngeal exudate.  Cardiovascular: Normal rate, regular rhythm and normal heart sounds.  Exam reveals no gallop and no friction rub.   No murmur heard. Pulmonary/Chest: Breath sounds normal. No respiratory distress. He has no wheezes. He has no rales.  Abdominal: Soft. Bowel sounds are normal. He exhibits no distension. There is no tenderness. There is no rebound.  Lymphadenopathy:    He has no cervical adenopathy.  Skin:  Skin is warm and dry.       + burn on right elbow  Psychiatric: He has a normal mood and affect. His behavior is normal.          Assessment & Plan:

## 2011-10-02 ENCOUNTER — Telehealth: Payer: Self-pay | Admitting: *Deleted

## 2011-10-02 DIAGNOSIS — G5760 Lesion of plantar nerve, unspecified lower limb: Secondary | ICD-10-CM | POA: Diagnosis not present

## 2011-10-02 DIAGNOSIS — B353 Tinea pedis: Secondary | ICD-10-CM | POA: Diagnosis not present

## 2011-10-02 NOTE — Telephone Encounter (Signed)
Received call from Dr. Alvan Dame with [facility], he is seeing the patient for severe athletes feet and toenail fungus.  Wanted to know if patient was a candidate for Lamisil.  Tylen did not disclose to him that he is Hepatitis C positive, and Lamisil can be hard on the liver.  He wanted an opinion from Dr. Luciana Axe, and I could call back his nurse, either Misty Stanley or Dilidia at [phone].  His last labs in April showed a normal ALT and AST. Wendall Mola CMA

## 2011-10-03 NOTE — Telephone Encounter (Signed)
I think lamisil will be fine.  He should just be reminded to really limit his alcohol.

## 2011-11-30 ENCOUNTER — Emergency Department (HOSPITAL_COMMUNITY): Payer: Medicare Other

## 2011-11-30 ENCOUNTER — Inpatient Hospital Stay (HOSPITAL_COMMUNITY)
Admission: EM | Admit: 2011-11-30 | Discharge: 2011-12-02 | Disposition: A | Discharge status: Home / Self Care | Payer: Medicare Other | Source: Home / Self Care | Attending: Internal Medicine | Admitting: Internal Medicine

## 2011-11-30 ENCOUNTER — Encounter (HOSPITAL_COMMUNITY): Payer: Self-pay | Admitting: Emergency Medicine

## 2011-11-30 DIAGNOSIS — F1210 Cannabis abuse, uncomplicated: Secondary | ICD-10-CM | POA: Diagnosis present

## 2011-11-30 DIAGNOSIS — K810 Acute cholecystitis: Secondary | ICD-10-CM | POA: Diagnosis present

## 2011-11-30 DIAGNOSIS — R1013 Epigastric pain: Principal | ICD-10-CM | POA: Diagnosis present

## 2011-11-30 DIAGNOSIS — K219 Gastro-esophageal reflux disease without esophagitis: Secondary | ICD-10-CM | POA: Diagnosis not present

## 2011-11-30 DIAGNOSIS — E860 Dehydration: Secondary | ICD-10-CM | POA: Diagnosis present

## 2011-11-30 DIAGNOSIS — I509 Heart failure, unspecified: Secondary | ICD-10-CM | POA: Diagnosis present

## 2011-11-30 DIAGNOSIS — K5900 Constipation, unspecified: Secondary | ICD-10-CM | POA: Diagnosis present

## 2011-11-30 DIAGNOSIS — F1010 Alcohol abuse, uncomplicated: Secondary | ICD-10-CM | POA: Diagnosis present

## 2011-11-30 DIAGNOSIS — I10 Essential (primary) hypertension: Secondary | ICD-10-CM

## 2011-11-30 DIAGNOSIS — B20 Human immunodeficiency virus [HIV] disease: Secondary | ICD-10-CM | POA: Diagnosis present

## 2011-11-30 DIAGNOSIS — I428 Other cardiomyopathies: Secondary | ICD-10-CM | POA: Diagnosis present

## 2011-11-30 DIAGNOSIS — R8290 Unspecified abnormal findings in urine: Secondary | ICD-10-CM | POA: Diagnosis present

## 2011-11-30 DIAGNOSIS — Z21 Asymptomatic human immunodeficiency virus [HIV] infection status: Secondary | ICD-10-CM | POA: Diagnosis present

## 2011-11-30 DIAGNOSIS — B1920 Unspecified viral hepatitis C without hepatic coma: Secondary | ICD-10-CM | POA: Diagnosis present

## 2011-11-30 DIAGNOSIS — R6510 Systemic inflammatory response syndrome (SIRS) of non-infectious origin without acute organ dysfunction: Secondary | ICD-10-CM | POA: Diagnosis present

## 2011-11-30 DIAGNOSIS — K828 Other specified diseases of gallbladder: Secondary | ICD-10-CM | POA: Diagnosis not present

## 2011-11-30 DIAGNOSIS — E876 Hypokalemia: Secondary | ICD-10-CM | POA: Diagnosis present

## 2011-11-30 DIAGNOSIS — R1011 Right upper quadrant pain: Secondary | ICD-10-CM | POA: Diagnosis present

## 2011-11-30 DIAGNOSIS — R0602 Shortness of breath: Secondary | ICD-10-CM | POA: Diagnosis not present

## 2011-11-30 DIAGNOSIS — J9601 Acute respiratory failure with hypoxia: Secondary | ICD-10-CM | POA: Diagnosis present

## 2011-11-30 DIAGNOSIS — R079 Chest pain, unspecified: Secondary | ICD-10-CM | POA: Diagnosis present

## 2011-11-30 DIAGNOSIS — R109 Unspecified abdominal pain: Secondary | ICD-10-CM | POA: Diagnosis not present

## 2011-11-30 DIAGNOSIS — R112 Nausea with vomiting, unspecified: Secondary | ICD-10-CM | POA: Diagnosis present

## 2011-11-30 DIAGNOSIS — J9819 Other pulmonary collapse: Secondary | ICD-10-CM | POA: Diagnosis not present

## 2011-11-30 DIAGNOSIS — R0789 Other chest pain: Secondary | ICD-10-CM | POA: Diagnosis not present

## 2011-11-30 DIAGNOSIS — Z79899 Other long term (current) drug therapy: Secondary | ICD-10-CM

## 2011-11-30 DIAGNOSIS — R197 Diarrhea, unspecified: Secondary | ICD-10-CM | POA: Diagnosis present

## 2011-11-30 DIAGNOSIS — M549 Dorsalgia, unspecified: Secondary | ICD-10-CM | POA: Diagnosis not present

## 2011-11-30 DIAGNOSIS — R0902 Hypoxemia: Secondary | ICD-10-CM | POA: Diagnosis present

## 2011-11-30 DIAGNOSIS — J9600 Acute respiratory failure, unspecified whether with hypoxia or hypercapnia: Secondary | ICD-10-CM | POA: Diagnosis present

## 2011-11-30 DIAGNOSIS — N179 Acute kidney failure, unspecified: Secondary | ICD-10-CM | POA: Diagnosis not present

## 2011-11-30 DIAGNOSIS — J189 Pneumonia, unspecified organism: Secondary | ICD-10-CM | POA: Diagnosis present

## 2011-11-30 DIAGNOSIS — I160 Hypertensive urgency: Secondary | ICD-10-CM | POA: Diagnosis present

## 2011-11-30 DIAGNOSIS — B182 Chronic viral hepatitis C: Secondary | ICD-10-CM | POA: Diagnosis present

## 2011-11-30 DIAGNOSIS — I5023 Acute on chronic systolic (congestive) heart failure: Secondary | ICD-10-CM | POA: Diagnosis present

## 2011-11-30 DIAGNOSIS — R599 Enlarged lymph nodes, unspecified: Secondary | ICD-10-CM | POA: Diagnosis not present

## 2011-11-30 LAB — URINALYSIS, ROUTINE W REFLEX MICROSCOPIC
Glucose, UA: NEGATIVE mg/dL
Nitrite: POSITIVE — AB
Protein, ur: >300 mg/dL — AB
Urobilinogen, UA: 1 mg/dL (ref 0.0–1.0)

## 2011-11-30 LAB — CBC WITH DIFFERENTIAL/PLATELET
Basophils Relative: 0 % (ref 0–1)
Eosinophils Absolute: 0 10*3/uL (ref 0.0–0.7)
HCT: 41.5 % (ref 39.0–52.0)
Hemoglobin: 13.8 g/dL (ref 13.0–17.0)
MCH: 28.3 pg (ref 26.0–34.0)
MCHC: 33.3 g/dL (ref 30.0–36.0)
MCV: 85 fL (ref 78.0–100.0)
Monocytes Absolute: 0.5 10*3/uL (ref 0.1–1.0)
Monocytes Relative: 5 % (ref 3–12)

## 2011-11-30 LAB — POCT I-STAT TROPONIN I: Troponin i, poc: 0.06 ng/mL (ref 0.00–0.08)

## 2011-11-30 LAB — COMPREHENSIVE METABOLIC PANEL
Albumin: 4 g/dL (ref 3.5–5.2)
BUN: 13 mg/dL (ref 6–23)
Chloride: 106 mEq/L (ref 96–112)
Creatinine, Ser: 1.15 mg/dL (ref 0.50–1.35)
Total Bilirubin: 0.8 mg/dL (ref 0.3–1.2)
Total Protein: 8.4 g/dL — ABNORMAL HIGH (ref 6.0–8.3)

## 2011-11-30 LAB — POCT I-STAT 3, VENOUS BLOOD GAS (G3P V)
Acid-base deficit: 5 mmol/L — ABNORMAL HIGH (ref 0.0–2.0)
O2 Saturation: 19 %
TCO2: 23 mmol/L (ref 0–100)
pO2, Ven: 17 mmHg — CL (ref 30.0–45.0)

## 2011-11-30 LAB — URINE MICROSCOPIC-ADD ON

## 2011-11-30 LAB — LACTIC ACID, PLASMA: Lactic Acid, Venous: 1.3 mmol/L (ref 0.5–2.2)

## 2011-11-30 LAB — LIPASE, BLOOD: Lipase: 14 U/L (ref 11–59)

## 2011-11-30 MED ORDER — MORPHINE SULFATE 2 MG/ML IJ SOLN
2.0000 mg | INTRAMUSCULAR | Status: DC | PRN
  Administered 2011-11-30 – 2011-12-02 (×3): 2 mg via INTRAVENOUS
  Filled 2011-11-30 (×3): qty 1

## 2011-11-30 MED ORDER — ONDANSETRON HCL 4 MG/2ML IJ SOLN
INTRAMUSCULAR | Status: AC
  Filled 2011-11-30: qty 2

## 2011-11-30 MED ORDER — SODIUM CHLORIDE 0.9 % IJ SOLN
3.0000 mL | Freq: Two times a day (BID) | INTRAMUSCULAR | Status: DC
  Administered 2011-12-01: 3 mL via INTRAVENOUS

## 2011-11-30 MED ORDER — SODIUM CHLORIDE 0.9 % IV SOLN
INTRAVENOUS | Status: DC
  Administered 2011-11-30: 22:00:00 via INTRAVENOUS
  Administered 2011-12-01: 1000 mL via INTRAVENOUS
  Administered 2011-12-01: 23:00:00 via INTRAVENOUS
  Administered 2011-12-01: 1000 mL via INTRAVENOUS
  Administered 2011-12-02: 06:00:00 via INTRAVENOUS

## 2011-11-30 MED ORDER — ONDANSETRON HCL 4 MG/2ML IJ SOLN
4.0000 mg | Freq: Once | INTRAMUSCULAR | Status: AC
  Administered 2011-11-30: 4 mg via INTRAVENOUS

## 2011-11-30 MED ORDER — LORAZEPAM 1 MG PO TABS
1.0000 mg | ORAL_TABLET | Freq: Four times a day (QID) | ORAL | Status: DC | PRN
  Administered 2011-12-01: 1 mg via ORAL
  Filled 2011-11-30: qty 1

## 2011-11-30 MED ORDER — LORAZEPAM 2 MG/ML IJ SOLN
1.0000 mg | Freq: Four times a day (QID) | INTRAMUSCULAR | Status: DC | PRN
  Administered 2011-12-01: 1 mg via INTRAVENOUS
  Filled 2011-11-30: qty 1

## 2011-11-30 MED ORDER — FOLIC ACID 1 MG PO TABS
1.0000 mg | ORAL_TABLET | Freq: Every day | ORAL | Status: DC
  Administered 2011-11-30 – 2011-12-02 (×3): 1 mg via ORAL
  Filled 2011-11-30 (×3): qty 1

## 2011-11-30 MED ORDER — VITAMIN B-1 100 MG PO TABS
100.0000 mg | ORAL_TABLET | Freq: Every day | ORAL | Status: DC
  Administered 2011-11-30 – 2011-12-02 (×2): 100 mg via ORAL
  Filled 2011-11-30 (×3): qty 1

## 2011-11-30 MED ORDER — LORATADINE 10 MG PO TABS
10.0000 mg | ORAL_TABLET | Freq: Every day | ORAL | Status: DC
  Administered 2011-11-30 – 2011-12-02 (×3): 10 mg via ORAL
  Filled 2011-11-30 (×3): qty 1

## 2011-11-30 MED ORDER — HYDROMORPHONE HCL PF 1 MG/ML IJ SOLN
1.0000 mg | Freq: Once | INTRAMUSCULAR | Status: AC
  Administered 2011-11-30: 1 mg via INTRAVENOUS
  Filled 2011-11-30: qty 1

## 2011-11-30 MED ORDER — DARUNAVIR ETHANOLATE 800 MG PO TABS
800.0000 mg | ORAL_TABLET | Freq: Every day | ORAL | Status: DC
  Administered 2011-12-01 – 2011-12-02 (×2): 800 mg via ORAL
  Filled 2011-11-30 (×3): qty 1

## 2011-11-30 MED ORDER — ACETAMINOPHEN 650 MG RE SUPP: 650.0000 mg | Freq: Four times a day (QID) | RECTAL | Status: DC | PRN

## 2011-11-30 MED ORDER — DAPSONE 100 MG PO TABS
100.0000 mg | ORAL_TABLET | Freq: Every day | ORAL | Status: DC
  Administered 2011-11-30 – 2011-12-01 (×2): 100 mg via ORAL
  Filled 2011-11-30 (×2): qty 1

## 2011-11-30 MED ORDER — ONDANSETRON HCL 4 MG/2ML IJ SOLN
4.0000 mg | Freq: Once | INTRAMUSCULAR | Status: AC
  Administered 2011-11-30: 4 mg via INTRAVENOUS
  Filled 2011-11-30: qty 2

## 2011-11-30 MED ORDER — HYDRALAZINE HCL 20 MG/ML IJ SOLN
10.0000 mg | Freq: Four times a day (QID) | INTRAMUSCULAR | Status: DC | PRN
  Administered 2011-12-01: 10 mg via INTRAVENOUS
  Filled 2011-11-30: qty 0.5

## 2011-11-30 MED ORDER — LISINOPRIL 40 MG PO TABS
40.0000 mg | ORAL_TABLET | Freq: Every day | ORAL | Status: DC
  Administered 2011-11-30 – 2011-12-02 (×3): 40 mg via ORAL
  Filled 2011-11-30 (×3): qty 1

## 2011-11-30 MED ORDER — PROMETHAZINE HCL 25 MG/ML IJ SOLN
25.0000 mg | Freq: Once | INTRAMUSCULAR | Status: AC
  Administered 2011-11-30: 25 mg via INTRAVENOUS
  Filled 2011-11-30: qty 1

## 2011-11-30 MED ORDER — SODIUM CHLORIDE 0.9 % IV BOLUS (SEPSIS)
1000.0000 mL | Freq: Once | INTRAVENOUS | Status: AC
  Administered 2011-11-30: 1000 mL via INTRAVENOUS

## 2011-11-30 MED ORDER — PIPERACILLIN-TAZOBACTAM 3.375 G IVPB
3.3750 g | Freq: Three times a day (TID) | INTRAVENOUS | Status: DC
  Administered 2011-11-30 – 2011-12-02 (×5): 3.375 g via INTRAVENOUS
  Filled 2011-11-30 (×9): qty 50

## 2011-11-30 MED ORDER — PANTOPRAZOLE SODIUM 40 MG PO TBEC
40.0000 mg | DELAYED_RELEASE_TABLET | Freq: Every day | ORAL | Status: DC
  Administered 2011-11-30 – 2011-12-01 (×2): 40 mg via ORAL
  Filled 2011-11-30 (×2): qty 1

## 2011-11-30 MED ORDER — ADULT MULTIVITAMIN W/MINERALS CH
1.0000 | ORAL_TABLET | Freq: Every day | ORAL | Status: DC
  Administered 2011-11-30 – 2011-12-02 (×3): 1 via ORAL
  Filled 2011-11-30 (×3): qty 1

## 2011-11-30 MED ORDER — IOHEXOL 350 MG/ML SOLN
100.0000 mL | Freq: Once | INTRAVENOUS | Status: AC | PRN
  Administered 2011-11-30: 100 mL via INTRAVENOUS

## 2011-11-30 MED ORDER — ACETAMINOPHEN 325 MG PO TABS
650.0000 mg | ORAL_TABLET | Freq: Four times a day (QID) | ORAL | Status: DC | PRN
  Administered 2011-12-01 – 2011-12-02 (×4): 650 mg via ORAL
  Filled 2011-11-30 (×5): qty 2

## 2011-11-30 MED ORDER — SODIUM CHLORIDE 0.9 % IV SOLN
3.0000 g | Freq: Once | INTRAVENOUS | Status: AC
  Administered 2011-11-30: 3 g via INTRAVENOUS
  Filled 2011-11-30: qty 3

## 2011-11-30 MED ORDER — HYDROCHLOROTHIAZIDE 25 MG PO TABS
25.0000 mg | ORAL_TABLET | Freq: Every day | ORAL | Status: DC
  Administered 2011-11-30 – 2011-12-01 (×2): 25 mg via ORAL
  Filled 2011-11-30 (×2): qty 1

## 2011-11-30 MED ORDER — ONDANSETRON HCL 4 MG/2ML IJ SOLN
4.0000 mg | Freq: Four times a day (QID) | INTRAMUSCULAR | Status: DC | PRN
  Administered 2011-12-01: 4 mg via INTRAVENOUS
  Filled 2011-11-30: qty 2

## 2011-11-30 MED ORDER — IOHEXOL 300 MG/ML  SOLN: 20.0000 mL | INTRAMUSCULAR | Status: DC

## 2011-11-30 MED ORDER — RITONAVIR 100 MG PO CAPS
100.0000 mg | ORAL_CAPSULE | Freq: Two times a day (BID) | ORAL | Status: DC
  Administered 2011-11-30 – 2011-12-01 (×2): 100 mg via ORAL
  Filled 2011-11-30 (×6): qty 1

## 2011-11-30 MED ORDER — THIAMINE HCL 100 MG/ML IJ SOLN
100.0000 mg | Freq: Every day | INTRAMUSCULAR | Status: DC
  Administered 2011-12-01: 100 mg via INTRAVENOUS
  Filled 2011-11-30 (×3): qty 1

## 2011-11-30 MED ORDER — SODIUM CHLORIDE 0.9 % IV SOLN
Freq: Once | INTRAVENOUS | Status: AC
  Administered 2011-11-30: 15:00:00 via INTRAVENOUS

## 2011-11-30 MED ORDER — ONDANSETRON HCL 4 MG PO TABS: 4.0000 mg | ORAL_TABLET | Freq: Four times a day (QID) | ORAL | Status: DC | PRN

## 2011-11-30 NOTE — Progress Notes (Signed)
ANTIBIOTIC CONSULT NOTE - INITIAL  Pharmacy Consult for zosyn Indication: acalculus cholecystitis  Allergies  Allergen Reactions  . Bactrim Rash   Vital Signs: Temp: 97.5 F (36.4 C) (07/01 1508) Temp src: Oral (07/01 1508) BP: 134/105 mmHg (07/01 1930) Pulse Rate: 111  (07/01 1930) Intake/Output from previous day:   Intake/Output from this shift:    Labs:  Basename 11/30/11 1132  WBC 10.2  HGB 13.8  PLT 226  LABCREA --  CREATININE 1.15   The CrCl is unknown because both a height and weight (above a minimum accepted value) are required for this calculation. No results found for this basename: VANCOTROUGH:2,VANCOPEAK:2,VANCORANDOM:2,GENTTROUGH:2,GENTPEAK:2,GENTRANDOM:2,TOBRATROUGH:2,TOBRAPEAK:2,TOBRARND:2,AMIKACINPEAK:2,AMIKACINTROU:2,AMIKACIN:2, in the last 72 hours   Microbiology: No results found for this or any previous visit (from the past 720 hour(s)).  Medical History: Past Medical History  Diagnosis Date  . Hypertension   . HIV infection    Assessment: 46 yom with a history of HIV presented to the ED with a 3-day history of abdominal pain, nausea and vomiting. Pt has received unasyn 3gm IV x 1 so far in the ED. Now to begin zosyn empirically for cholecystitis.   Plan:  1. Zosyn 3.375gm IV Q8H (4 hour infusion) 2. F/u C&S, renal fxn  Cuma Polyakov, Drake Leach 11/30/2011,7:55 PM

## 2011-11-30 NOTE — Consult Note (Signed)
Reason for Consult:  Abdominal pain. Referring Physician:  Margarita Grizzle, MD  [name] is an 46 y.o. male.  HPI: Pt is a 46 year old HIV + male with around 3 days of worsening abdominal pain.  He also is having nausea and vomiting.  He describes the pain as periumbilical to me, but described it as more RUQ to ED staff.  This was 10/10 upon arrival to ED.  He denies fevers, but has had some chills.  He has not had post prandial pain previously.  He has not had an appetite this week.  He came in tachycardic and became hypoxic.  He underwent CT scan which was suspicious for gallbladder disease as well as an ultrasound.  These were both suspicious for acalculous cholecystitis.    Past Medical History  Diagnosis Date  . Hypertension   . HIV infection     Past Surgical History  Procedure Date  . Finger surgery     Family History  Problem Relation Age of Onset  . Cancer Mother     Social History:  reports that he has never smoked. He has never used smokeless tobacco. He reports that he drinks about 3.5 ounces of alcohol per week. He reports that he uses illicit drugs about 7 times per week.  Allergies:  Allergies  Allergen Reactions  . Bactrim Rash     MedicationsLong-Term  Prescriptions [facility] Facility-Administered Medications    dapsone 100 MG tablet   darunavir (PREZISTA) 400 MG tablet   hydrochlorothiazide (HYDRODIURIL) 25 MG tablet   lisinopril (PRINIVIL,ZESTRIL) 40 MG tablet   loratadine (CLARITIN) 10 MG tablet   naftifine (NAFTIN) 1 % cream   omeprazole (PRILOSEC) 40 MG capsule   ritonavir (NORVIR) 100 MG capsule     Results for orders placed during the hospital encounter of 11/30/11 (from the past 48 hour(s))  CBC WITH DIFFERENTIAL     Status: Abnormal   Collection Time   11/30/11 11:32 AM      Component Value Range Comment   WBC 10.2  4.0 - 10.5 K/uL    RBC 4.88  4.22 - 5.81 MIL/uL    Hemoglobin 13.8  13.0 - 17.0 g/dL    HCT 16.1  09.6 - 04.5 %    MCV 85.0   78.0 - 100.0 fL    MCH 28.3  26.0 - 34.0 pg    MCHC 33.3  30.0 - 36.0 g/dL    RDW 40.9  81.1 - 91.4 %    Platelets 226  150 - 400 K/uL    Neutrophils Relative 79 (*) 43 - 77 %    Neutro Abs 8.1 (*) 1.7 - 7.7 K/uL    Lymphocytes Relative 16  12 - 46 %    Lymphs Abs 1.6  0.7 - 4.0 K/uL    Monocytes Relative 5  3 - 12 %    Monocytes Absolute 0.5  0.1 - 1.0 K/uL    Eosinophils Relative 0  0 - 5 %    Eosinophils Absolute 0.0  0.0 - 0.7 K/uL    Basophils Relative 0  0 - 1 %    Basophils Absolute 0.0  0.0 - 0.1 K/uL   COMPREHENSIVE METABOLIC PANEL     Status: Abnormal   Collection Time   11/30/11 11:32 AM      Component Value Range Comment   Sodium 140  135 - 145 mEq/L    Potassium 3.7  3.5 - 5.1 mEq/L    Chloride 106  96 - 112 mEq/L    CO2 19  19 - 32 mEq/L    Glucose, Bld 163 (*) 70 - 99 mg/dL    BUN 13  6 - 23 mg/dL    Creatinine, Ser 1.61  0.50 - 1.35 mg/dL    Calcium 9.5  8.4 - 09.6 mg/dL    Total Protein 8.4 (*) 6.0 - 8.3 g/dL    Albumin 4.0  3.5 - 5.2 g/dL    AST 31  0 - 37 U/L    ALT 26  0 - 53 U/L    Alkaline Phosphatase 77  39 - 117 U/L    Total Bilirubin 0.8  0.3 - 1.2 mg/dL    GFR calc non Af Amer 74 (*) >90 mL/min    GFR calc Af Amer 85 (*) >90 mL/min   LIPASE, BLOOD     Status: Normal   Collection Time   11/30/11 11:32 AM      Component Value Range Comment   Lipase 14  11 - 59 U/L   URINALYSIS, ROUTINE W REFLEX MICROSCOPIC     Status: Abnormal   Collection Time   11/30/11 12:23 PM      Component Value Range Comment   Color, Urine AMBER (*) YELLOW BIOCHEMICALS MAY BE AFFECTED BY COLOR   APPearance CLEAR  CLEAR    Specific Gravity, Urine 1.029  1.005 - 1.030    pH 6.0  5.0 - 8.0    Glucose, UA NEGATIVE  NEGATIVE mg/dL    Hgb urine dipstick TRACE (*) NEGATIVE    Bilirubin Urine MODERATE (*) NEGATIVE    Ketones, ur 15 (*) NEGATIVE mg/dL    Protein, ur >045 (*) NEGATIVE mg/dL    Urobilinogen, UA 1.0  0.0 - 1.0 mg/dL    Nitrite POSITIVE (*) NEGATIVE    Leukocytes, UA  SMALL (*) NEGATIVE   URINE MICROSCOPIC-ADD ON     Status: Normal   Collection Time   11/30/11 12:23 PM      Component Value Range Comment   WBC, UA 0-2  <3 WBC/hpf    RBC / HPF 0-2  <3 RBC/hpf    Bacteria, UA RARE  RARE    Urine-Other MUCOUS PRESENT     POCT I-STAT TROPONIN I     Status: Normal   Collection Time   11/30/11 12:44 PM      Component Value Range Comment   Troponin i, poc 0.06  0.00 - 0.08 ng/mL    Comment 3            TROPONIN I     Status: Normal   Collection Time   11/30/11  3:26 PM      Component Value Range Comment   Troponin I <0.30  <0.30 ng/mL   POCT I-STAT 3, BLOOD GAS (G3P V)     Status: Abnormal   Collection Time   11/30/11  5:02 PM      Component Value Range Comment   pH, Ven 7.273  7.250 - 7.300    pCO2, Ven 47.4  45.0 - 50.0 mmHg    pO2, Ven 17.0 (*) 30.0 - 45.0 mmHg    Bicarbonate 21.9  20.0 - 24.0 mEq/L    TCO2 23  0 - 100 mmol/L    O2 Saturation 19.0      Acid-base deficit 5.0 (*) 0.0 - 2.0 mmol/L    Sample type VENOUS      Comment NOTIFIED PHYSICIAN     LACTIC ACID, PLASMA  Status: Normal   Collection Time   11/30/11  5:34 PM      Component Value Range Comment   Lactic Acid, Venous 1.3  0.5 - 2.2 mmol/L   TROPONIN I     Status: Normal   Collection Time   11/30/11  6:33 PM      Component Value Range Comment   Troponin I <0.30  <0.30 ng/mL   PROCALCITONIN     Status: Normal   Collection Time   11/30/11  6:34 PM      Component Value Range Comment   Procalcitonin <0.10       Ct Angio Chest W/cm &/or Wo Cm  11/30/2011  *RADIOLOGY REPORT*  Clinical Data:  Chest and abdominal pain.  CT ANGIOGRAPHY CHEST CT ABDOMEN AND PELVIS WITH CONTRAST  Technique:  Multidetector CT imaging of the chest was performed using the standard protocol during bolus administration of intravenous contrast.  Multiplanar CT image reconstructions including MIPs were obtained to evaluate the vascular anatomy. Multidetector CT imaging of the abdomen and pelvis was performed using the  standard protocol during bolus administration of intravenous contrast.  Contrast: OMNIPAQUE IOHEXOL 350 MG/ML SOLN  Comparison:   None.  CTA CHEST  Findings:  The chest wall is unremarkable.  No supraclavicular or axillary lymphadenopathy.  The thyroid gland appears normal.  The bony thorax is intact.  No destructive bony lesions or spinal canal compromise.  The heart is borderline enlarged.  No pericardial effusion.  There are mildly enlarged mediastinal lymph nodes which require follow- up.  The largest right pretracheal node measures 17 mm and there are 216 mm aorticopulmonary window nodes.  The esophagus is grossly normal.  The aorta is normal in caliber.  The pulmonary arterial tree is well opacified.  No filling defects to suggest pulmonary emboli.  Examination of the lung parenchyma demonstrates early emphysematous changes with apical bulla.  There is dependent atelectasis but no infiltrates, edema or effusions.  No worrisome nodules or masses.  The upper abdomen is grossly normal.   Review of the MIP images confirms the above findings.  IMPRESSION:  1.  No CT evidence of pulmonary emboli. 2.  Normal thoracic aorta. 3.  Mild cardiac enlargement. 4.  Mildly enlarged mediastinal lymph nodes.  I would suggest a follow-up chest CT in 4 months to reevaluate.  CT ABDOMEN AND PELVIS  Findings: The liver is unremarkable.  No focal lesions or intrahepatic biliary dilatation. No obvious gallstones.  Possible mild gallbladder wall thickening but no pericholecystic inflammatory changes.  Ultrasound may be helpful for further evaluation.  No common bile duct dilatation.  Some reflux of contrast is noted down the IVC and into the hepatic veins.  This could be due to tricuspid regurgitation.  The spleen is normal in size.  No focal lesions.  The pancreas is normal.  The adrenal glands and kidneys are normal except for left renal cyst.  The stomach, duodenum, small bowel and colon are unremarkable.  No inflammatory  changes or mass lesions.  The appendix is normal.  The aorta is normal in caliber.  The major branch vessels are normal. No mesenteric or retroperitoneal masses are adenopathy.  The bladder, prostate gland and seminal vesicles are unremarkable. No pelvic mass, adenopathy or free pelvic fluid collections.  No inguinal mass or hernia.  The bony structures are unremarkable.  Review of the MIP images confirms the above findings.  IMPRESSION:  1.  Mild gallbladder wall thickening is suspected.  No obvious gallstones  or pericholecystic inflammatory change but ultrasound may be helpful for further evaluation if the patient has any right upper quadrant pain. 2.  No other significant abdominal/pelvic findings.  Original Report Authenticated By: P. Loralie Champagne, M.D.   US Abdomen Complete  11/30/2011  *RADIOLOGY REPORT*  Clinical Data:  Nausea and vomiting.  COMPLETE ABDOMINAL ULTRASOUND  Comparison:  CT scan, same date.  Findings:  Gallbladder:  No definite gallstones but there is marked gallbladder wall thickening and a small amount of pericholecystic fluid which may suggest acalculus cholecystitis.  Common bile duct:  Normal in caliber measuring a maximum of 3.25mm.  Liver:  The liver is sonographically unremarkable.  There is normal echogenicity without focal lesions or intrahepatic biliary dilatation.  IVC:  Normal caliber.  Pancreas:  Limited visualization by ultrasound but appeared normal on the CT scan.  Spleen:  Normal size and echogenicity without focal lesions.  Right Kidney:  10.1 cm in length. Normal renal cortical thickness and echogenicity without focal lesions or hydronephrosis.  Left Kidney:  12.4 cm in length. Normal renal cortical thickness and echogenicity without focal lesions or hydronephrosis.4.7 x 3.3 x 4.0 cm simple appearing lower pole cyst.  Abdominal aorta:  Normal caliber.  IMPRESSION:  1.  Diffuse and fairly marked gallbladder wall thickening without definite gallstones.  Findings could  suggest acalculous cholecystitis. 2.  Normal caliber common bile duct. 3.  No other significant abdominal findings.  Original Report Authenticated By: P. Loralie Champagne, M.D.   Ct Abdomen Pelvis W Contrast  11/30/2011  *RADIOLOGY REPORT*  Clinical Data:  Chest and abdominal pain.  CT ANGIOGRAPHY CHEST CT ABDOMEN AND PELVIS WITH CONTRAST  Technique:  Multidetector CT imaging of the chest was performed using the standard protocol during bolus administration of intravenous contrast.  Multiplanar CT image reconstructions including MIPs were obtained to evaluate the vascular anatomy. Multidetector CT imaging of the abdomen and pelvis was performed using the standard protocol during bolus administration of intravenous contrast.  Contrast: OMNIPAQUE IOHEXOL 350 MG/ML SOLN  Comparison:   None.  CTA CHEST  Findings:  The chest wall is unremarkable.  No supraclavicular or axillary lymphadenopathy.  The thyroid gland appears normal.  The bony thorax is intact.  No destructive bony lesions or spinal canal compromise.  The heart is borderline enlarged.  No pericardial effusion.  There are mildly enlarged mediastinal lymph nodes which require follow- up.  The largest right pretracheal node measures 17 mm and there are 216 mm aorticopulmonary window nodes.  The esophagus is grossly normal.  The aorta is normal in caliber.  The pulmonary arterial tree is well opacified.  No filling defects to suggest pulmonary emboli.  Examination of the lung parenchyma demonstrates early emphysematous changes with apical bulla.  There is dependent atelectasis but no infiltrates, edema or effusions.  No worrisome nodules or masses.  The upper abdomen is grossly normal.   Review of the MIP images confirms the above findings.  IMPRESSION:  1.  No CT evidence of pulmonary emboli. 2.  Normal thoracic aorta. 3.  Mild cardiac enlargement. 4.  Mildly enlarged mediastinal lymph nodes.  I would suggest a follow-up chest CT in 4 months to reevaluate.   CT ABDOMEN AND PELVIS  Findings: The liver is unremarkable.  No focal lesions or intrahepatic biliary dilatation. No obvious gallstones.  Possible mild gallbladder wall thickening but no pericholecystic inflammatory changes.  Ultrasound may be helpful for further evaluation.  No common bile duct dilatation.  Some reflux of contrast is  noted down the IVC and into the hepatic veins.  This could be due to tricuspid regurgitation.  The spleen is normal in size.  No focal lesions.  The pancreas is normal.  The adrenal glands and kidneys are normal except for left renal cyst.  The stomach, duodenum, small bowel and colon are unremarkable.  No inflammatory changes or mass lesions.  The appendix is normal.  The aorta is normal in caliber.  The major branch vessels are normal. No mesenteric or retroperitoneal masses are adenopathy.  The bladder, prostate gland and seminal vesicles are unremarkable. No pelvic mass, adenopathy or free pelvic fluid collections.  No inguinal mass or hernia.  The bony structures are unremarkable.  Review of the MIP images confirms the above findings.  IMPRESSION:  1.  Mild gallbladder wall thickening is suspected.  No obvious gallstones or pericholecystic inflammatory change but ultrasound may be helpful for further evaluation if the patient has any right upper quadrant pain. 2.  No other significant abdominal/pelvic findings.  Original Report Authenticated By: P. Loralie Champagne, M.D.   Dg Abd Acute W/chest  11/30/2011  *RADIOLOGY REPORT*  Clinical Data: Shortness of breath.  Abdomen and back pain. Constipation.  ACUTE ABDOMEN SERIES (ABDOMEN 2 VIEW & CHEST 1 VIEW)  Comparison: Chest dated 07/26/2006.  Findings: Interval enlargement of the cardiac silhouette.  Interval small amount of linear density in both mid and lower lung zones with interval minimal prominence of the interstitial markings.  No pleural fluid.  Normal bowel gas pattern without free peritoneal air.  Normal amount of stool.   Minimal scoliosis.  IMPRESSION:  1.  Interval cardiomegaly, pulmonary vascular congestion and mild interstitial pulmonary edema/chronic interstitial lung disease. 2.  Small amount of linear scarring or atelectasis bilaterally. 3.  No acute abdominal abnormality.  Original Report Authenticated By: Darrol Angel, M.D.    Review of Systems  Constitutional: Positive for malaise/fatigue. Negative for fever and chills.  HENT: Negative.   Eyes: Negative.   Respiratory: Positive for cough.   Cardiovascular: Negative.   Gastrointestinal: Positive for nausea, vomiting and abdominal pain.  Genitourinary: Negative.   Musculoskeletal: Negative.   Skin: Negative.   Neurological: Negative.   Endo/Heme/Allergies: Negative.   Psychiatric/Behavioral: Negative.    Blood pressure 134/105, pulse 111, temperature 97.5 F (36.4 C), temperature source Oral, resp. rate 26, SpO2 100.00%. Physical Exam  Constitutional: He is oriented to person, place, and time. He appears well-developed.       Very thin  HENT:  Head: Normocephalic and atraumatic.       Lower lip piercing  Eyes: Conjunctivae are normal. Pupils are equal, round, and reactive to light. No scleral icterus.  Neck: Normal range of motion. Neck supple.  Cardiovascular: Normal rate, regular rhythm, normal heart sounds and intact distal pulses.   Respiratory: Effort normal and breath sounds normal. No respiratory distress. He exhibits no tenderness.  GI: Soft. He exhibits distension. There is tenderness (epigastrium and periumbilical). There is guarding (voluntary guarding). There is no rebound.  Musculoskeletal: Normal range of motion.  Neurological: He is alert and oriented to person, place, and time. Coordination normal.  Skin: Skin is warm and dry. No rash noted. No erythema. No pallor.  Psychiatric: He has a normal mood and affect. His behavior is normal. Judgment and thought content normal.    Assessment/Plan: Probable acalculous  cholecystitis with SIRS IV antibiotics NPO IVFluids ? CD 4 count. Dr. Lindie Spruce to assume care in AM.  He may request HIDA scan.  This is not an available test at this time of day.  Likely OR tomorrow.    Ahri Olson 11/30/2011, 8:01 PM

## 2011-11-30 NOTE — ED Notes (Addendum)
Pt reports (L) side chest pain and abd pain starting Saturday, reports abd feels "swollen and tight." pt reports sudden onset of lower back pain today. Pt reports he began feeling sob,dizzy and diaphoretic since coming into ED. Pt restless and diaphoretic

## 2011-11-30 NOTE — ED Notes (Signed)
Pt O2 level noted to be dropping while at bedside, when patient sleeps he is breathing through his mouth, NRB placed at time to supplement O2.

## 2011-11-30 NOTE — ED Notes (Addendum)
Pt's began desating, c/o increase (L) side chest pain, diaphoretic, and dizziness. Pt's monitor was alarming V-Tach, new EKG obtained, 2L O2 applied, sats at 84% w/O2, EDPA Hunt and EDP Preston Fleeting aware, at bedside and given a new copy of EKG.

## 2011-11-30 NOTE — H&P (Signed)
History and Physical  [name]:[id number] DOB: 12-26-1965 DOA: 11/30/2011  Referring physician: Margarita Grizzle, MD PCP: No primary provider on file.  Infectious disease: [name], M.D (discussed with Dr. Elesa Massed he is not PCP)  Chief Complaint: Abdominal pain  HPI:  46 year old man with history of hypertension and HIV presents to the emergency department with three-day history of epigastric and right upper quadrant pain, nausea and vomiting. Pain 10/10 in intensity and intermittent in nature. No aggravating or alleviating factors. 2 days ago he developed left-sided chest pain which has been constant in nature. This has been associated with shortness of breath. He has felt generally weak.  In the emergency department he was noted to be tachypneic, hypertensive and tachycardic. Without oxygen he desaturated into the 80s. With oxygenation seems stabilized now on facemask. Venous blood gas was unremarkable. Complete metabolic panel and lipase unremarkable. Cardiac enzymes negative thus far. Lactic acid normal. CBC unremarkable. Imaging was notable for possible acalculous cholecystitis.  Review of Systems:  Negative for fever, changes to his vision, sore throat, rash, new muscle aches, dysuria, bleeding. Otherwise as above.  Past Medical History  Diagnosis Date  . Hypertension   . HIV infection    Past Surgical History  Procedure Date  . Finger surgery    Social History:  reports that he has never smoked. He has never used smokeless tobacco. He reports that he drinks about 3.5 ounces of alcohol per week. He reports that he uses illicit drugs about 7 times per week.  Allergies  Allergen Reactions  . Bactrim Rash   Family History  Problem Relation Age of Onset  . Cancer Mother     Prior to Admission medications   Medication Sig Start Date End Date Taking? Authorizing Provider  dapsone 100 MG tablet Take 100 mg by mouth daily.   Yes Historical Provider, MD  darunavir  (PREZISTA) 400 MG tablet Take 800 mg by mouth daily with breakfast.   Yes Historical Provider, MD  hydrochlorothiazide (HYDRODIURIL) 25 MG tablet Take 25 mg by mouth daily. 04/15/11  Yes [name], MD  lisinopril (PRINIVIL,ZESTRIL) 40 MG tablet Take 1 tablet (40 mg total) by mouth daily. 04/15/11  Yes [name], MD  loratadine (CLARITIN) 10 MG tablet Take 10 mg by mouth daily.     Yes Historical Provider, MD  naftifine (NAFTIN) 1 % cream Apply 1 application topically 2 (two) times daily. For 6 weeks.  Started 10/05/2011 10/05/11  Yes Historical Provider, MD  omeprazole (PRILOSEC) 40 MG capsule Take 1 capsule (40 mg total) by mouth daily. 09/15/10  Yes Carolin Guernsey, NP  ritonavir (NORVIR) 100 MG capsule Take 100 mg by mouth 2 (two) times daily.   Yes Historical Provider, MD   Physical Exam: Filed Vitals:   11/30/11 1545 11/30/11 1553 11/30/11 1600 11/30/11 1654  BP: 142/101  134/110 148/125  Pulse: 103 104 75   Temp:      TempSrc:      Resp: 22 26 22    SpO2: 90% 94% 98%      General:  Appears calm. Mildly uncomfortable. Examined in emergency department.  Eyes: Pupils equal, round, react to light.  lids irises and conjunctivae appear unremarkable.  Neck: No lymphadenopathy or masses. No thyromegaly.  ENT: Normal hearing. Lips and tongue appear unremarkable.  Cardiovascular: Tachycardic, regular rhythm. No murmur, rub, gallop. No lower extremity edema.  Respiratory: Decreased breath sounds bilaterally with some wheezes. No Rales or rhonchi. Mild increased work of breathing.  Abdomen: Soft, nondistended. Exquisite epigastric and right upper quadrant pain.  Skin: Appears grossly unremarkable.  Musculoskeletal: Tone and strength appear grossly unremarkable.  Psychiatric: Grossly normal mood and affect. Speech fluent and appropriate.  Neurologic: Appears unremarkable.  Labs on Admission:  Basic Metabolic Panel:  Lab 11/30/11 1610  NA 140  K 3.7  CL 106  CO2 19    GLUCOSE 163*  BUN 13  CREATININE 1.15  CALCIUM 9.5  MG --  PHOS --    Liver Function Tests:  Lab 11/30/11 1132  AST 31  ALT 26  ALKPHOS 77  BILITOT 0.8  PROT 8.4*  ALBUMIN 4.0    Lab 11/30/11 1132  LIPASE 14  AMYLASE --   CBC:  Lab 11/30/11 1132  WBC 10.2  NEUTROABS 8.1*  HGB 13.8  HCT 41.5  MCV 85.0  PLT 226    Cardiac Enzymes:  Lab 11/30/11 1833 11/30/11 1526  CKTOTAL -- --  CKMB -- --  CKMBINDEX -- --  TROPONINI <0.30 <0.30    Troponin (Point of Care Test)  Basename 11/30/11 1244  TROPIPOC 0.06   ABG    Component Value Date/Time   HCO3 21.9 11/30/2011 1702   TCO2 23 11/30/2011 1702   ACIDBASEDEF 5.0* 11/30/2011 1702   O2SAT 19.0 11/30/2011 1702   Radiological Exams on Admission: Ct Angio Chest W/cm &/or Wo Cm  11/30/2011  *RADIOLOGY REPORT*  Clinical Data:  Chest and abdominal pain.  CT ANGIOGRAPHY CHEST CT ABDOMEN AND PELVIS WITH CONTRAST  Technique:  Multidetector CT imaging of the chest was performed using the standard protocol during bolus administration of intravenous contrast.  Multiplanar CT image reconstructions including MIPs were obtained to evaluate the vascular anatomy. Multidetector CT imaging of the abdomen and pelvis was performed using the standard protocol during bolus administration of intravenous contrast.  Contrast: OMNIPAQUE IOHEXOL 350 MG/ML SOLN  Comparison:   None.  CTA CHEST  Findings:  The chest wall is unremarkable.  No supraclavicular or axillary lymphadenopathy.  The thyroid gland appears normal.  The bony thorax is intact.  No destructive bony lesions or spinal canal compromise.  The heart is borderline enlarged.  No pericardial effusion.  There are mildly enlarged mediastinal lymph nodes which require follow- up.  The largest right pretracheal node measures 17 mm and there are 216 mm aorticopulmonary window nodes.  The esophagus is grossly normal.  The aorta is normal in caliber.  The pulmonary arterial tree is well opacified.   No filling defects to suggest pulmonary emboli.  Examination of the lung parenchyma demonstrates early emphysematous changes with apical bulla.  There is dependent atelectasis but no infiltrates, edema or effusions.  No worrisome nodules or masses.  The upper abdomen is grossly normal.   Review of the MIP images confirms the above findings.  IMPRESSION:  1.  No CT evidence of pulmonary emboli. 2.  Normal thoracic aorta. 3.  Mild cardiac enlargement. 4.  Mildly enlarged mediastinal lymph nodes.  I would suggest a follow-up chest CT in 4 months to reevaluate.  CT ABDOMEN AND PELVIS  Findings: The liver is unremarkable.  No focal lesions or intrahepatic biliary dilatation. No obvious gallstones.  Possible mild gallbladder wall thickening but no pericholecystic inflammatory changes.  Ultrasound may be helpful for further evaluation.  No common bile duct dilatation.  Some reflux of contrast is noted down the IVC and into the hepatic veins.  This could be due to tricuspid regurgitation.  The spleen is normal in size.  No focal lesions.  The pancreas is normal.  The adrenal glands and kidneys are normal except for left renal cyst.  The stomach, duodenum, small bowel and colon are unremarkable.  No inflammatory changes or mass lesions.  The appendix is normal.  The aorta is normal in caliber.  The major branch vessels are normal. No mesenteric or retroperitoneal masses are adenopathy.  The bladder, prostate gland and seminal vesicles are unremarkable. No pelvic mass, adenopathy or free pelvic fluid collections.  No inguinal mass or hernia.  The bony structures are unremarkable.  Review of the MIP images confirms the above findings.  IMPRESSION:  1.  Mild gallbladder wall thickening is suspected.  No obvious gallstones or pericholecystic inflammatory change but ultrasound may be helpful for further evaluation if the patient has any right upper quadrant pain. 2.  No other significant abdominal/pelvic findings.  Original  Report Authenticated By: P. Loralie Champagne, M.D.   Ct Abdomen Pelvis W Contrast  11/30/2011  *RADIOLOGY REPORT*  Clinical Data:  Chest and abdominal pain.  CT ANGIOGRAPHY CHEST CT ABDOMEN AND PELVIS WITH CONTRAST  Technique:  Multidetector CT imaging of the chest was performed using the standard protocol during bolus administration of intravenous contrast.  Multiplanar CT image reconstructions including MIPs were obtained to evaluate the vascular anatomy. Multidetector CT imaging of the abdomen and pelvis was performed using the standard protocol during bolus administration of intravenous contrast.  Contrast: OMNIPAQUE IOHEXOL 350 MG/ML SOLN  Comparison:   None.  CTA CHEST  Findings:  The chest wall is unremarkable.  No supraclavicular or axillary lymphadenopathy.  The thyroid gland appears normal.  The bony thorax is intact.  No destructive bony lesions or spinal canal compromise.  The heart is borderline enlarged.  No pericardial effusion.  There are mildly enlarged mediastinal lymph nodes which require follow- up.  The largest right pretracheal node measures 17 mm and there are 216 mm aorticopulmonary window nodes.  The esophagus is grossly normal.  The aorta is normal in caliber.  The pulmonary arterial tree is well opacified.  No filling defects to suggest pulmonary emboli.  Examination of the lung parenchyma demonstrates early emphysematous changes with apical bulla.  There is dependent atelectasis but no infiltrates, edema or effusions.  No worrisome nodules or masses.  The upper abdomen is grossly normal.   Review of the MIP images confirms the above findings.  IMPRESSION:  1.  No CT evidence of pulmonary emboli. 2.  Normal thoracic aorta. 3.  Mild cardiac enlargement. 4.  Mildly enlarged mediastinal lymph nodes.  I would suggest a follow-up chest CT in 4 months to reevaluate.  CT ABDOMEN AND PELVIS  Findings: The liver is unremarkable.  No focal lesions or intrahepatic biliary dilatation. No obvious  gallstones.  Possible mild gallbladder wall thickening but no pericholecystic inflammatory changes.  Ultrasound may be helpful for further evaluation.  No common bile duct dilatation.  Some reflux of contrast is noted down the IVC and into the hepatic veins.  This could be due to tricuspid regurgitation.  The spleen is normal in size.  No focal lesions.  The pancreas is normal.  The adrenal glands and kidneys are normal except for left renal cyst.  The stomach, duodenum, small bowel and colon are unremarkable.  No inflammatory changes or mass lesions.  The appendix is normal.  The aorta is normal in caliber.  The major branch vessels are normal. No mesenteric or retroperitoneal masses are adenopathy.  The bladder, prostate gland and  seminal vesicles are unremarkable. No pelvic mass, adenopathy or free pelvic fluid collections.  No inguinal mass or hernia.  The bony structures are unremarkable.  Review of the MIP images confirms the above findings.  IMPRESSION:  1.  Mild gallbladder wall thickening is suspected.  No obvious gallstones or pericholecystic inflammatory change but ultrasound may be helpful for further evaluation if the patient has any right upper quadrant pain. 2.  No other significant abdominal/pelvic findings.  Original Report Authenticated By: P. Loralie Champagne, M.D.   Dg Abd Acute W/chest  11/30/2011  *RADIOLOGY REPORT*  Clinical Data: Shortness of breath.  Abdomen and back pain. Constipation.  ACUTE ABDOMEN SERIES (ABDOMEN 2 VIEW & CHEST 1 VIEW)  Comparison: Chest dated 07/26/2006.  Findings: Interval enlargement of the cardiac silhouette.  Interval small amount of linear density in both mid and lower lung zones with interval minimal prominence of the interstitial markings.  No pleural fluid.  Normal bowel gas pattern without free peritoneal air.  Normal amount of stool.  Minimal scoliosis.  IMPRESSION:  1.  Interval cardiomegaly, pulmonary vascular congestion and mild interstitial pulmonary  edema/chronic interstitial lung disease. 2.  Small amount of linear scarring or atelectasis bilaterally. 3.  No acute abdominal abnormality.  Original Report Authenticated By: Darrol Angel, M.D.    EKG: Independently reviewed. Sinus rhythm. Left bundle branch block, age unknown.   Principal Problem:  *Cholecystitis, acute Active Problems:  SIRS (systemic inflammatory response syndrome)  Epigastric abdominal pain  RUQ pain  Hypoxia  Hypertensive urgency  Chest pain  HIV (human immunodeficiency virus infection)  Alcohol abuse  Marijuana abuse   Assessment/Plan 1. Epigastric and right upper quadrant abdominal pain: Possible acute acalculous cholecystitis. Empiric antibiotic therapy. General surgery consultation is been placed by emergency department physician. 2. Chest pain: Etiology unclear. Suspect related to epigastric pain rather than cardiac in etiology. Cardiac enzymes negative thus far. Treat blood pressure. Serial cardiac enzymes. 3. Hypoxia: Etiology unclear. CT of the chest unremarkable. Appears nontoxic. 4. Hypertensive urgency: Patient reports blood pressure is poorly controlled at baseline. Resume antihypertensives if able to tolerate by mouth. Otherwise may need IV treatment. 5. HIV: Continue antiretrovirals. 6. Alcohol abuse: CIWA. Monitor for withdrawal. 7. Marijuana use  Code Status: Full code Family Communication: Discussed with husband at bedside with patient's permission Disposition Plan: Pending further evaluation  Brendia Sacks, MD  [facility] Pager [phone] If 8PM-8AM, please contact floor/night-coverage at [email or web address], password [facility] 11/30/2011, 5:51 PM

## 2011-11-30 NOTE — ED Notes (Signed)
Attempted to call report x 1  

## 2011-11-30 NOTE — ED Provider Notes (Addendum)
46 year old male with known HIV disease has been having abdominal pain, back pain for the last several days and developed chest pain last night. There has been associated dyspnea. Chest pain is somewhat worse with any deep breath. There has been some nausea but no vomiting. He is not aware of any fever or. On exam, he is tachycardic and hypertensive lungs are clear and abdomen is soft with minimal tenderness. He is noted to have developed some hypoxia with no obvious changes on his chest x-ray. Therefore, CT angiogram is being obtained as well as CT scan of his abdomen.   Date: 11/30/2011 1200  Rate: 123  Rhythm: sinus bradycardia  QRS Axis: right  Intervals: normal  ST/T Wave abnormalities: normal  Conduction Disutrbances:nonspecific intraventricular conduction delay  Narrative Interpretation: Right axis deviation, nonspecific intraventricular conduction delay, left atrial hypertrophy. When compared with ECG of 07/05/2003, all changes are new. Old EKG Reviewed: changes noted   Date: 11/30/2011 1515  Rate: 97  Rhythm: normal sinus rhythm  QRS Axis: right  Intervals: normal  ST/T Wave abnormalities: normal  Conduction Disutrbances:nonspecific intraventricular conduction delay  Narrative Interpretation:   Old EKG Reviewed: unchanged    Dione Booze, MD 11/30/11 1524  Dione Booze, MD 11/30/11 [phone]

## 2011-11-30 NOTE — ED Notes (Signed)
Pt to ultrasound

## 2011-11-30 NOTE — ED Notes (Signed)
Pt resting in bed, no distress noted, O2 level 100% on NRB mask. Pt denies pain at this time. Awaiting CT.

## 2011-11-30 NOTE — ED Notes (Signed)
MD Ray at bedside, pt returned from CT.

## 2011-11-30 NOTE — ED Provider Notes (Signed)
History     CSN: [id number]  Arrival date & time 11/30/11  1053   First MD Initiated Contact with Patient 11/30/11 1137      Chief Complaint  Patient presents with  . Abdominal Pain  . Constipation    (Consider location/radiation/quality/duration/timing/severity/associated sxs/prior treatment) Patient is a 46 y.o. male presenting with abdominal pain and constipation. The history is provided by the patient and medical records.  Abdominal Pain The primary symptoms of the illness include abdominal pain.  Additional symptoms associated with the illness include constipation.  Constipation  Associated symptoms include abdominal pain.    Patient who is HIV positive who has had an undetectable viral load for many years on his current antiviral regimen presents to emergency department complaining of weakness, abdominal pain, chest pain, shortness of breath, and constipation. Patient states he did not get much sleep 2 nights ago and felt weak and tired at work yesterday however throughout the day he felt gradual increasing generalized weakness and states "I just did not feel well" and therefore left work early. Patient states that after arriving home yesterday he started having gradual onset of generalized abdominal pain. Patient states that his whole abdomen felt sore and tight like he had use the bathroom but couldn't. Patient states he's been straining to have a bowel movement from yesterday and today without any bowel movement. He complains of ongoing generalized abdominal pain. Patient states the pain radiates into his back. Pain is associated with nausea but denies vomiting or diarrhea. Patient is also complaining of gradual onset chest pain with shortness of breath. Patient states the chest pain started yesterday and has been constant since onset with associated shortness of breath. He denies aggravating or alleviating factors. Patient states pain is located in left side of his chest. States been  a constant dull ache. Patient also has history of alcohol abuse. Patient states he drinks approximately a 6 pack a day and has done so for many years. Patient states he has one sexual partner and uses condoms.  Past Medical History  Diagnosis Date  . Hypertension   . HIV infection     History reviewed. No pertinent past surgical history.  History reviewed. No pertinent family history.  History  Substance Use Topics  . Smoking status: Never Smoker   . Smokeless tobacco: Never Used  . Alcohol Use: 3.5 oz/week    7 drink(s) per week     socially      Review of Systems  Gastrointestinal: Positive for abdominal pain and constipation.  All other systems reviewed and are negative.    Allergies  Bactrim  Home Medications   Current Outpatient Rx  Name Route Sig Dispense Refill  . DAPSONE 100 MG PO TABS Oral Take 100 mg by mouth daily.    Marland Kitchen DARUNAVIR ETHANOLATE 400 MG PO TABS Oral Take 800 mg by mouth daily with breakfast.    . HYDROCHLOROTHIAZIDE 25 MG PO TABS Oral Take 25 mg by mouth daily.    Marland Kitchen LISINOPRIL 40 MG PO TABS Oral Take 1 tablet (40 mg total) by mouth daily. 30 tablet 3  . LORATADINE 10 MG PO TABS Oral Take 10 mg by mouth daily.      Marland Kitchen NAFTIFINE HCL 1 % EX CREA Topical Apply 1 application topically 2 (two) times daily. For 6 weeks.  Started 10/05/2011    . OMEPRAZOLE 40 MG PO CPDR Oral Take 1 capsule (40 mg total) by mouth daily. 30 capsule 11  . RITONAVIR 100  MG PO CAPS Oral Take 100 mg by mouth 2 (two) times daily.      BP 153/113  Pulse 131  Temp 98.9 F (37.2 C)  Resp 20  SpO2 95%  Physical Exam  Nursing note and vitals reviewed. Constitutional: He is oriented to person, place, and time. He appears well-developed and well-nourished. No distress.  HENT:  Head: Normocephalic and atraumatic.  Eyes: Conjunctivae are normal.  Neck: Normal range of motion. Neck supple.  Cardiovascular: Regular rhythm, normal heart sounds and intact distal pulses.  Tachycardia  present.  Exam reveals no gallop and no friction rub.   No murmur heard. Pulmonary/Chest: Effort normal and breath sounds normal. No respiratory distress. He has no wheezes. He has no rales. He exhibits no tenderness.  Abdominal: Bowel sounds are normal. He exhibits distension. He exhibits no mass. There is tenderness. There is no rebound and no guarding.       Abdomen is distended but soft. Diffuse generalized tenderness without guarding or peritoneal signs.  Musculoskeletal: Normal range of motion. He exhibits no edema and no tenderness.  Neurological: He is alert and oriented to person, place, and time.  Skin: Skin is warm and dry. No rash noted. He is not diaphoretic. No erythema.  Psychiatric: He has a normal mood and affect.    ED Course  Procedures (including critical care time)  IV dilaudid and zofran.   Labs Reviewed  CBC WITH DIFFERENTIAL - Abnormal; Notable for the following:    Neutrophils Relative 79 (*)     Neutro Abs 8.1 (*)     All other components within normal limits  COMPREHENSIVE METABOLIC PANEL - Abnormal; Notable for the following:    Glucose, Bld 163 (*)     Total Protein 8.4 (*)     GFR calc non Af Amer 74 (*)     GFR calc Af Amer 85 (*)     All other components within normal limits  URINALYSIS, ROUTINE W REFLEX MICROSCOPIC - Abnormal; Notable for the following:    Color, Urine AMBER (*)  BIOCHEMICALS MAY BE AFFECTED BY COLOR   Hgb urine dipstick TRACE (*)     Bilirubin Urine MODERATE (*)     Ketones, ur 15 (*)     Protein, ur >300 (*)     Nitrite POSITIVE (*)     Leukocytes, UA SMALL (*)     All other components within normal limits  LIPASE, BLOOD  POCT I-STAT TROPONIN I  URINE MICROSCOPIC-ADD ON  TROPONIN I  URINE CULTURE   Dg Abd Acute W/chest  11/30/2011  *RADIOLOGY REPORT*  Clinical Data: Shortness of breath.  Abdomen and back pain. Constipation.  ACUTE ABDOMEN SERIES (ABDOMEN 2 VIEW & CHEST 1 VIEW)  Comparison: Chest dated 07/26/2006.  Findings:  Interval enlargement of the cardiac silhouette.  Interval small amount of linear density in both mid and lower lung zones with interval minimal prominence of the interstitial markings.  No pleural fluid.  Normal bowel gas pattern without free peritoneal air.  Normal amount of stool.  Minimal scoliosis.  IMPRESSION:  1.  Interval cardiomegaly, pulmonary vascular congestion and mild interstitial pulmonary edema/chronic interstitial lung disease. 2.  Small amount of linear scarring or atelectasis bilaterally. 3.  No acute abdominal abnormality.  Original Report Authenticated By: Darrol Angel, M.D.     No diagnosis found.  Patient became more SOB and diaphoretic in ER and began to desat on 2Liters and therefore moved to non rebreather. Dr. Preston Fleeting to bedside  and will add CT angio chest to further evaluate worsening of symptoms and r/o PE.   MDM  Sign out given to Margarita Grizzle with dispo pending CT angio chest and abdomen pelvis       Drucie Opitz, PA 11/30/11 1622

## 2011-11-30 NOTE — ED Provider Notes (Signed)
5:00 PM Patient care assumed from [place], [facility].  Patient with abdominal pain . 2 days and left chest pain began last nigth.  Patient with nausea with vomiting began on way to hospital.  Cough nonproductive, denies fever or chills.  HIV positive on meds followed by [facility]. No history of dvt or pe.  Patient with decreased sats here and now on nrb with sats 99%.  ABG and ct of chest and abdomen results pending.  Patient's care discussed with Dr. Irene Limbo and he will see and evluate.  Currently unclear source of hypoxia and acidosis.  Plan additional procalcitonin, abg, (previously ordered was drawn as vbg), repeat troponin.   Patient with increased hr now at 120s.  Additional liter of fluid ordered and phenergan as patient continues with nausea and vomiting.   6:38 PM Ultrasound reviewed.  Patient care discussed with Dr. Donell Beers.  Unasyn ordered . CRITICAL CARE Performed by: Hilario Quarry   Total critical care time:45 Critical care time was exclusive of separately billable procedures and treating other patients.  Critical care was necessary to treat or prevent imminent or life-threatening deterioration.  Critical care was time spent personally by me on the following activities: development of treatment plan with patient and/or surrogate as well as nursing, discussions with consultants, evaluation of patient's response to treatment, examination of patient, obtaining history from patient or surrogate, ordering and performing treatments and interventions, ordering and review of laboratory studies, ordering and review of radiographic studies, pulse oximetry and re-evaluation of patient's condition.   Hilario Quarry, MD 11/30/11 [phone]

## 2011-11-30 NOTE — ED Notes (Signed)
Pt to CT scan.

## 2011-11-30 NOTE — ED Notes (Signed)
Pt c/o generalized abd pain with nausea x 2 days into back; pt sts no BM x 2 days

## 2011-12-01 ENCOUNTER — Encounter (HOSPITAL_COMMUNITY): Payer: Self-pay | Admitting: *Deleted

## 2011-12-01 ENCOUNTER — Inpatient Hospital Stay (HOSPITAL_COMMUNITY): Payer: Medicare Other

## 2011-12-01 DIAGNOSIS — R8290 Unspecified abnormal findings in urine: Secondary | ICD-10-CM | POA: Diagnosis present

## 2011-12-01 DIAGNOSIS — R109 Unspecified abdominal pain: Secondary | ICD-10-CM | POA: Diagnosis not present

## 2011-12-01 DIAGNOSIS — E860 Dehydration: Secondary | ICD-10-CM | POA: Diagnosis present

## 2011-12-01 LAB — COMPREHENSIVE METABOLIC PANEL
ALT: 20 U/L (ref 0–53)
BUN: 12 mg/dL (ref 6–23)
CO2: 18 mEq/L — ABNORMAL LOW (ref 19–32)
Calcium: 8.2 mg/dL — ABNORMAL LOW (ref 8.4–10.5)
Creatinine, Ser: 1.2 mg/dL (ref 0.50–1.35)
GFR calc Af Amer: 81 mL/min — ABNORMAL LOW (ref >90)
GFR calc non Af Amer: 70 mL/min — ABNORMAL LOW (ref >90)
Glucose, Bld: 94 mg/dL (ref 70–99)
Sodium: 139 mEq/L (ref 135–145)

## 2011-12-01 LAB — URINE CULTURE: Culture: NO GROWTH

## 2011-12-01 LAB — CBC
Hemoglobin: 11.9 g/dL — ABNORMAL LOW (ref 13.0–17.0)
MCHC: 33.7 g/dL (ref 30.0–36.0)
WBC: 9.9 10*3/uL (ref 4.0–10.5)

## 2011-12-01 MED ORDER — HYDRALAZINE HCL 20 MG/ML IJ SOLN
10.0000 mg | INTRAMUSCULAR | Status: DC
  Administered 2011-12-01 – 2011-12-02 (×6): 10 mg via INTRAVENOUS
  Filled 2011-12-01 (×14): qty 0.5

## 2011-12-01 MED ORDER — RITONAVIR 100 MG PO TABS
100.0000 mg | ORAL_TABLET | Freq: Two times a day (BID) | ORAL | Status: DC
  Administered 2011-12-02 (×2): 100 mg via ORAL
  Filled 2011-12-01 (×3): qty 1

## 2011-12-01 MED ORDER — PANTOPRAZOLE SODIUM 40 MG PO TBEC
40.0000 mg | DELAYED_RELEASE_TABLET | Freq: Two times a day (BID) | ORAL | Status: DC
  Administered 2011-12-02: 40 mg via ORAL
  Filled 2011-12-01: qty 1

## 2011-12-01 MED ORDER — KETOROLAC TROMETHAMINE 30 MG/ML IJ SOLN
30.0000 mg | Freq: Three times a day (TID) | INTRAMUSCULAR | Status: DC | PRN
  Administered 2011-12-01: 30 mg via INTRAVENOUS
  Filled 2011-12-01: qty 1

## 2011-12-01 MED ORDER — ETRAVIRINE 100 MG PO TABS
200.0000 mg | ORAL_TABLET | Freq: Two times a day (BID) | ORAL | Status: DC
  Administered 2011-12-01 – 2011-12-02 (×3): 200 mg via ORAL
  Filled 2011-12-01 (×5): qty 2

## 2011-12-01 MED ORDER — WHITE PETROLATUM GEL
Status: AC
  Administered 2011-12-01: 15:00:00
  Filled 2011-12-01: qty 5

## 2011-12-01 MED ORDER — EMTRICITABINE-TENOFOVIR DF 200-300 MG PO TABS
1.0000 | ORAL_TABLET | Freq: Every day | ORAL | Status: DC
  Administered 2011-12-01 – 2011-12-02 (×2): 1 via ORAL
  Filled 2011-12-01 (×2): qty 1

## 2011-12-01 MED ORDER — TECHNETIUM TC 99M MEBROFENIN IV KIT
5.0000 | PACK | Freq: Once | INTRAVENOUS | Status: AC | PRN
  Administered 2011-12-01: 5 via INTRAVENOUS

## 2011-12-01 NOTE — Progress Notes (Signed)
HIDA scan is complete and to my eye demonstrates filling of the gallbladder.  This means the patient does not have acute cholecystitis.  No indication for surgery.  Marta Lamas. Gae Bon, MD, FACS [phone] Trauma Surgeon

## 2011-12-01 NOTE — Progress Notes (Signed)
This patient has a number of reasons why he may have RUQ pain and tenderness, although his CT and ultrasound suggest acalculous cholecystitis.  Will get HIDA scan to confirm.  Marta Lamas. Gae Bon, MD, FACS [phone] [facility]

## 2011-12-01 NOTE — Progress Notes (Signed)
[facility]  Interim history: 46 year old man with history of hypertension and HIV presents to the emergency department with three-day history of epigastric and right upper quadrant pain, nausea and vomiting. Pain 10/10 in intensity and intermittent in nature. No aggravating or alleviating factors. 2 days ago he developed left-sided chest pain which has been constant in nature. This has been associated with shortness of breath. He has felt generally weak.  In the emergency department he was noted to be tachypneic, hypertensive and tachycardic. Without oxygen he desaturated into the 80s. With oxygenation seems stabilized now on facemask. Venous blood gas was unremarkable. Complete metabolic panel and lipase unremarkable. Cardiac enzymes negative thus far. Lactic acid normal. CBC unremarkable. Imaging was notable for possible acalculous cholecystitis.   Subjective: Alert and complains of unrelieved abdominal pain especially in the epigastric region. Also complaining of a headache. Has developed diarrhea since admission.  Objective: Vital signs in last 24 hours: Temp:  [97.5 F (36.4 C)-97.7 F (36.5 C)] 97.6 F (36.4 C) (07/02 0845) Pulse Rate:  [75-122] 97  (07/02 0845) Resp:  [15-30] 24  (07/02 0845) BP: (127-190)/(94-140) 143/104 mmHg (07/02 0845) SpO2:  [84 %-100 %] 96 % (07/02 0845) Weight:  [72 kg (158 lb 11.7 oz)] 72 kg (158 lb 11.7 oz) (07/02 0040) Weight change:  Last BM Date: 12/01/11  Intake/Output from previous day: 07/01 0701 - 07/02 0700 In: 1550 [I.V.:1500; IV Piggyback:50] Out: 650 [Urine:650] Intake/Output this shift: Total I/O In: 503 [I.V.:503] Out: 1 [Stool:1]  General appearance: alert, cooperative, appears stated age and mild distress Resp: clear to auscultation bilaterally but decreased in the bases, requiring nasal cannula oxygen Cardio: regular rate and rhythm, S1, S2 normal, no murmur, click, rub or gallop, diastolic blood pressure remains greater  than 100 GI: soft, diffusely tender but more focally so in the epigastric region and right upper quadrant; bowel sounds normal; no masses,  no organomegaly Extremities: extremities normal, atraumatic, no cyanosis or edema Neurologic: Grossly normal  Lab Results:  Basename 12/01/11 0400 11/30/11 1132  WBC 9.9 10.2  HGB 11.9* 13.8  HCT 35.3* 41.5  PLT 206 226   BMET  Basename 12/01/11 0400 11/30/11 1132  NA 139 140  K 3.5 3.7  CL 108 106  CO2 18* 19  GLUCOSE 94 163*  BUN 12 13  CREATININE 1.20 1.15  CALCIUM 8.2* 9.5    Studies/Results: Ct Angio Chest W/cm &/or Wo Cm  11/30/2011  *RADIOLOGY REPORT*  Clinical Data:  Chest and abdominal pain.  CT ANGIOGRAPHY CHEST CT ABDOMEN AND PELVIS WITH CONTRAST  Technique:  Multidetector CT imaging of the chest was performed using the standard protocol during bolus administration of intravenous contrast.  Multiplanar CT image reconstructions including MIPs were obtained to evaluate the vascular anatomy. Multidetector CT imaging of the abdomen and pelvis was performed using the standard protocol during bolus administration of intravenous contrast.  Contrast: OMNIPAQUE IOHEXOL 350 MG/ML SOLN  Comparison:   None.  CTA CHEST  Findings:  The chest wall is unremarkable.  No supraclavicular or axillary lymphadenopathy.  The thyroid gland appears normal.  The bony thorax is intact.  No destructive bony lesions or spinal canal compromise.  The heart is borderline enlarged.  No pericardial effusion.  There are mildly enlarged mediastinal lymph nodes which require follow- up.  The largest right pretracheal node measures 17 mm and there are 216 mm aorticopulmonary window nodes.  The esophagus is grossly normal.  The aorta is normal in caliber.  The pulmonary  arterial tree is well opacified.  No filling defects to suggest pulmonary emboli.  Examination of the lung parenchyma demonstrates early emphysematous changes with apical bulla.  There is dependent  atelectasis but no infiltrates, edema or effusions.  No worrisome nodules or masses.  The upper abdomen is grossly normal.   Review of the MIP images confirms the above findings.  IMPRESSION:  1.  No CT evidence of pulmonary emboli. 2.  Normal thoracic aorta. 3.  Mild cardiac enlargement. 4.  Mildly enlarged mediastinal lymph nodes.  I would suggest a follow-up chest CT in 4 months to reevaluate.  CT ABDOMEN AND PELVIS  Findings: The liver is unremarkable.  No focal lesions or intrahepatic biliary dilatation. No obvious gallstones.  Possible mild gallbladder wall thickening but no pericholecystic inflammatory changes.  Ultrasound may be helpful for further evaluation.  No common bile duct dilatation.  Some reflux of contrast is noted down the IVC and into the hepatic veins.  This could be due to tricuspid regurgitation.  The spleen is normal in size.  No focal lesions.  The pancreas is normal.  The adrenal glands and kidneys are normal except for left renal cyst.  The stomach, duodenum, small bowel and colon are unremarkable.  No inflammatory changes or mass lesions.  The appendix is normal.  The aorta is normal in caliber.  The major branch vessels are normal. No mesenteric or retroperitoneal masses are adenopathy.  The bladder, prostate gland and seminal vesicles are unremarkable. No pelvic mass, adenopathy or free pelvic fluid collections.  No inguinal mass or hernia.  The bony structures are unremarkable.  Review of the MIP images confirms the above findings.  IMPRESSION:  1.  Mild gallbladder wall thickening is suspected.  No obvious gallstones or pericholecystic inflammatory change but ultrasound may be helpful for further evaluation if the patient has any right upper quadrant pain. 2.  No other significant abdominal/pelvic findings.  Original Report Authenticated By: P. Loralie Champagne, M.D.   US Abdomen Complete  11/30/2011  *RADIOLOGY REPORT*  Clinical Data:  Nausea and vomiting.  COMPLETE ABDOMINAL  ULTRASOUND  Comparison:  CT scan, same date.  Findings:  Gallbladder:  No definite gallstones but there is marked gallbladder wall thickening and a small amount of pericholecystic fluid which may suggest acalculus cholecystitis.  Common bile duct:  Normal in caliber measuring a maximum of 3.42mm.  Liver:  The liver is sonographically unremarkable.  There is normal echogenicity without focal lesions or intrahepatic biliary dilatation.  IVC:  Normal caliber.  Pancreas:  Limited visualization by ultrasound but appeared normal on the CT scan.  Spleen:  Normal size and echogenicity without focal lesions.  Right Kidney:  10.1 cm in length. Normal renal cortical thickness and echogenicity without focal lesions or hydronephrosis.  Left Kidney:  12.4 cm in length. Normal renal cortical thickness and echogenicity without focal lesions or hydronephrosis.4.7 x 3.3 x 4.0 cm simple appearing lower pole cyst.  Abdominal aorta:  Normal caliber.  IMPRESSION:  1.  Diffuse and fairly marked gallbladder wall thickening without definite gallstones.  Findings could suggest acalculous cholecystitis. 2.  Normal caliber common bile duct. 3.  No other significant abdominal findings.  Original Report Authenticated By: P. Loralie Champagne, M.D.   Ct Abdomen Pelvis W Contrast  11/30/2011  *RADIOLOGY REPORT*  Clinical Data:  Chest and abdominal pain.  CT ANGIOGRAPHY CHEST CT ABDOMEN AND PELVIS WITH CONTRAST  Technique:  Multidetector CT imaging of the chest was performed using the standard protocol during  bolus administration of intravenous contrast.  Multiplanar CT image reconstructions including MIPs were obtained to evaluate the vascular anatomy. Multidetector CT imaging of the abdomen and pelvis was performed using the standard protocol during bolus administration of intravenous contrast.  Contrast: OMNIPAQUE IOHEXOL 350 MG/ML SOLN  Comparison:   None.  CTA CHEST  Findings:  The chest wall is unremarkable.  No supraclavicular or  axillary lymphadenopathy.  The thyroid gland appears normal.  The bony thorax is intact.  No destructive bony lesions or spinal canal compromise.  The heart is borderline enlarged.  No pericardial effusion.  There are mildly enlarged mediastinal lymph nodes which require follow- up.  The largest right pretracheal node measures 17 mm and there are 216 mm aorticopulmonary window nodes.  The esophagus is grossly normal.  The aorta is normal in caliber.  The pulmonary arterial tree is well opacified.  No filling defects to suggest pulmonary emboli.  Examination of the lung parenchyma demonstrates early emphysematous changes with apical bulla.  There is dependent atelectasis but no infiltrates, edema or effusions.  No worrisome nodules or masses.  The upper abdomen is grossly normal.   Review of the MIP images confirms the above findings.  IMPRESSION:  1.  No CT evidence of pulmonary emboli. 2.  Normal thoracic aorta. 3.  Mild cardiac enlargement. 4.  Mildly enlarged mediastinal lymph nodes.  I would suggest a follow-up chest CT in 4 months to reevaluate.  CT ABDOMEN AND PELVIS  Findings: The liver is unremarkable.  No focal lesions or intrahepatic biliary dilatation. No obvious gallstones.  Possible mild gallbladder wall thickening but no pericholecystic inflammatory changes.  Ultrasound may be helpful for further evaluation.  No common bile duct dilatation.  Some reflux of contrast is noted down the IVC and into the hepatic veins.  This could be due to tricuspid regurgitation.  The spleen is normal in size.  No focal lesions.  The pancreas is normal.  The adrenal glands and kidneys are normal except for left renal cyst.  The stomach, duodenum, small bowel and colon are unremarkable.  No inflammatory changes or mass lesions.  The appendix is normal.  The aorta is normal in caliber.  The major branch vessels are normal. No mesenteric or retroperitoneal masses are adenopathy.  The bladder, prostate gland and seminal  vesicles are unremarkable. No pelvic mass, adenopathy or free pelvic fluid collections.  No inguinal mass or hernia.  The bony structures are unremarkable.  Review of the MIP images confirms the above findings.  IMPRESSION:  1.  Mild gallbladder wall thickening is suspected.  No obvious gallstones or pericholecystic inflammatory change but ultrasound may be helpful for further evaluation if the patient has any right upper quadrant pain. 2.  No other significant abdominal/pelvic findings.  Original Report Authenticated By: P. Loralie Champagne, M.D.   Dg Abd Acute W/chest  11/30/2011  *RADIOLOGY REPORT*  Clinical Data: Shortness of breath.  Abdomen and back pain. Constipation.  ACUTE ABDOMEN SERIES (ABDOMEN 2 VIEW & CHEST 1 VIEW)  Comparison: Chest dated 07/26/2006.  Findings: Interval enlargement of the cardiac silhouette.  Interval small amount of linear density in both mid and lower lung zones with interval minimal prominence of the interstitial markings.  No pleural fluid.  Normal bowel gas pattern without free peritoneal air.  Normal amount of stool.  Minimal scoliosis.  IMPRESSION:  1.  Interval cardiomegaly, pulmonary vascular congestion and mild interstitial pulmonary edema/chronic interstitial lung disease. 2.  Small amount of linear scarring or  atelectasis bilaterally. 3.  No acute abdominal abnormality.  Original Report Authenticated By: Darrol Angel, M.D.    Medications: I have reviewed the patient's current medications.  Assessment/Plan:  Principal Problem:  *Question of Acute acalculous cholecystitis/ Epigastric abdominal pain/ RUQ pain *Appreciate surgery assistance *HIDA scan is negative *d/c  Zosyn *Continue n.p.o. Status Question gastritis vs PUD- increase PPI to BID. D/c Toradol (given one time prior to HIDA) *LFTs are normal  Active Problems:  SIRS (systemic inflammatory response syndrome)/Dehydration *Hydrate and treat symptoms: I.e. adequate pain management and liberal use of  anti-emetics    Acute respiratory failure with hypoxia *Patient endorses difficulty taking in a deep breath due to complaints of  worsening abdominal pain since suspect primary mechanical etiology *CT the chest is unremarkable regarding acute process   Hypertensive urgency *Diastolic blood pressure poorly controlled *Increase IV Apresoline from every 6 hours when necessary to every 4 hours on schedule *If blood pressure remains poorly controlled may need to add an IV beta blockers *Continue home ACE inhibitor therapy- hold hctz due to dehydation   HIV (human immunodeficiency virus infection) *Resume home antiretroviral treatment *Recent viral load nondetectable which is a wonderful finding *In April patient CD4 count was 500 which is also good *Dr. Butler Denmark on contacted after Orvan Falconer with infectious disease as his CD4 count has been greater than 200 x 2, recommendation was to discontinue the dapsone   HEPATITIS C *Quiescent and LFTs are normal   Chest pain *Seems to be GI in nature given the location of his epigastric and right upper quadrant pain as well * enzyme and EKG are nonischemic   Alcohol abuse/Marijuana abuse *Continue CIWA protocol   Abnormal urinalysis *Likely secondary to dehydration but a urine culture is pending and currently on appropriate broad-spectrum antibiotic coverage   Disposition *Transfer to floor- cont NPO for now, increase PPI to BID    LOS: 1 day   Junious Silk, ANP pager [phone]  [facility] [email or web address] Password: [email or web address]  12/01/2011, 12:10 PM  I have examined the patient, evaluated the chart and modified the above note which I agree with.   Calvert Cantor, MD [phone]

## 2011-12-01 NOTE — Progress Notes (Signed)
Tried to call report to [address] x 2. Accepting nurse is not ready to take report.

## 2011-12-01 NOTE — ED Provider Notes (Signed)
Medical screening examination/treatment/procedure(s) were conducted as a shared visit with non-physician practitioner(s) and myself.  I personally evaluated the patient during the encounter   Amorah Sebring, MD 12/01/11 0708 

## 2011-12-02 ENCOUNTER — Telehealth: Payer: Self-pay | Admitting: *Deleted

## 2011-12-02 DIAGNOSIS — R1011 Right upper quadrant pain: Secondary | ICD-10-CM | POA: Diagnosis not present

## 2011-12-02 DIAGNOSIS — I10 Essential (primary) hypertension: Secondary | ICD-10-CM | POA: Diagnosis not present

## 2011-12-02 MED ORDER — OMEPRAZOLE 20 MG PO CPDR: 20.0000 mg | DELAYED_RELEASE_CAPSULE | Freq: Two times a day (BID) | ORAL | Status: DC

## 2011-12-02 MED ORDER — TRAMADOL HCL 50 MG PO TABS: 50.0000 mg | ORAL_TABLET | Freq: Four times a day (QID) | ORAL | Status: DC | PRN

## 2011-12-02 NOTE — Progress Notes (Signed)
Patient ID: [name], male   DOB: 1965/08/05, 46 y.o.   MRN: [id number]    Subjective: No acute changes overnight  Objective: Vital signs in last 24 hours: Temp:  [97.5 F (36.4 C)-98.5 F (36.9 C)] 98.2 F (36.8 C) (07/03 0610) Pulse Rate:  [98-108] 100  (07/03 0610) Resp:  [18-20] 18  (07/03 0610) BP: (131-158)/(80-117) 158/80 mmHg (07/03 0610) SpO2:  [91 %-99 %] 95 % (07/03 0610) Last BM Date: 12/01/11  Intake/Output from previous day: 07/02 0701 - 07/03 0700 In: 503 [I.V.:503] Out: 3 [Stool:3] Intake/Output this shift:    PE: Abd: soft, mildly tender, +bs  Lab Results:   Basename 12/01/11 0400 11/30/11 1132  WBC 9.9 10.2  HGB 11.9* 13.8  HCT 35.3* 41.5  PLT 206 226   BMET  Basename 12/01/11 0400 11/30/11 1132  NA 139 140  K 3.5 3.7  CL 108 106  CO2 18* 19  GLUCOSE 94 163*  BUN 12 13  CREATININE 1.20 1.15  CALCIUM 8.2* 9.5   PT/INR No results found for this basename: LABPROT:2,INR:2 in the last 72 hours CMP     Component Value Date/Time   NA 139 12/01/2011 0400   K 3.5 12/01/2011 0400   CL 108 12/01/2011 0400   CO2 18* 12/01/2011 0400   GLUCOSE 94 12/01/2011 0400   BUN 12 12/01/2011 0400   CREATININE 1.20 12/01/2011 0400   CREATININE 1.10 09/01/2011 1055   CALCIUM 8.2* 12/01/2011 0400   PROT 6.8 12/01/2011 0400   ALBUMIN 3.3* 12/01/2011 0400   AST 24 12/01/2011 0400   ALT 20 12/01/2011 0400   ALKPHOS 70 12/01/2011 0400   BILITOT 0.7 12/01/2011 0400   GFRNONAA 70* 12/01/2011 0400   GFRAA 81* 12/01/2011 0400   Lipase     Component Value Date/Time   LIPASE 14 11/30/2011 1132       Studies/Results: Ct Angio Chest W/cm &/or Wo Cm  11/30/2011  *RADIOLOGY REPORT*  Clinical Data:  Chest and abdominal pain.  CT ANGIOGRAPHY CHEST CT ABDOMEN AND PELVIS WITH CONTRAST  Technique:  Multidetector CT imaging of the chest was performed using the standard protocol during bolus administration of intravenous contrast.  Multiplanar CT image reconstructions including MIPs were obtained  to evaluate the vascular anatomy. Multidetector CT imaging of the abdomen and pelvis was performed using the standard protocol during bolus administration of intravenous contrast.  Contrast: OMNIPAQUE IOHEXOL 350 MG/ML SOLN  Comparison:   None.  CTA CHEST  Findings:  The chest wall is unremarkable.  No supraclavicular or axillary lymphadenopathy.  The thyroid gland appears normal.  The bony thorax is intact.  No destructive bony lesions or spinal canal compromise.  The heart is borderline enlarged.  No pericardial effusion.  There are mildly enlarged mediastinal lymph nodes which require follow- up.  The largest right pretracheal node measures 17 mm and there are 216 mm aorticopulmonary window nodes.  The esophagus is grossly normal.  The aorta is normal in caliber.  The pulmonary arterial tree is well opacified.  No filling defects to suggest pulmonary emboli.  Examination of the lung parenchyma demonstrates early emphysematous changes with apical bulla.  There is dependent atelectasis but no infiltrates, edema or effusions.  No worrisome nodules or masses.  The upper abdomen is grossly normal.   Review of the MIP images confirms the above findings.  IMPRESSION:  1.  No CT evidence of pulmonary emboli. 2.  Normal thoracic aorta. 3.  Mild cardiac enlargement. 4.  Mildly enlarged mediastinal lymph nodes.  I would suggest a follow-up chest CT in 4 months to reevaluate.  CT ABDOMEN AND PELVIS  Findings: The liver is unremarkable.  No focal lesions or intrahepatic biliary dilatation. No obvious gallstones.  Possible mild gallbladder wall thickening but no pericholecystic inflammatory changes.  Ultrasound may be helpful for further evaluation.  No common bile duct dilatation.  Some reflux of contrast is noted down the IVC and into the hepatic veins.  This could be due to tricuspid regurgitation.  The spleen is normal in size.  No focal lesions.  The pancreas is normal.  The adrenal glands and kidneys are normal  except for left renal cyst.  The stomach, duodenum, small bowel and colon are unremarkable.  No inflammatory changes or mass lesions.  The appendix is normal.  The aorta is normal in caliber.  The major branch vessels are normal. No mesenteric or retroperitoneal masses are adenopathy.  The bladder, prostate gland and seminal vesicles are unremarkable. No pelvic mass, adenopathy or free pelvic fluid collections.  No inguinal mass or hernia.  The bony structures are unremarkable.  Review of the MIP images confirms the above findings.  IMPRESSION:  1.  Mild gallbladder wall thickening is suspected.  No obvious gallstones or pericholecystic inflammatory change but ultrasound may be helpful for further evaluation if the patient has any right upper quadrant pain. 2.  No other significant abdominal/pelvic findings.  Original Report Authenticated By: P. Loralie Champagne, M.D.   Nm Hepatobiliary  12/01/2011  *RADIOLOGY REPORT*  Clinical Data: Abdominal pain, possible acute cholecystitis  NUCLEAR MEDICINE HEPATOHBILIARY INCLUDE GB  Radiopharmaceutical:  5 mCi Tc-9m mebrofenin  Comparison: None Correlation:  Ultrasound abdomen 11/30/2011, CT abdomen 11/30/2011  Findings: Mild delay in clearance of tracer from bloodstream suggesting mild hepatocellular dysfunction. Prompt excretion of tracer by the liver into the biliary system. Gallbladder is visualized at 10 minutes. Small bowel is visualized at 17 minutes. No evidence of common bile duct or cystic duct obstruction.  IMPRESSION: Patent biliary tree without evidence of acalculous cholecystitis. Somewhat prolonged clearance of tracer from bloodstream suggesting a mild degree of hepatocellular dysfunction.  Original Report Authenticated By: Lollie Marrow, M.D.   US Abdomen Complete  11/30/2011  *RADIOLOGY REPORT*  Clinical Data:  Nausea and vomiting.  COMPLETE ABDOMINAL ULTRASOUND  Comparison:  CT scan, same date.  Findings:  Gallbladder:  No definite gallstones but there is  marked gallbladder wall thickening and a small amount of pericholecystic fluid which may suggest acalculus cholecystitis.  Common bile duct:  Normal in caliber measuring a maximum of 3.38mm.  Liver:  The liver is sonographically unremarkable.  There is normal echogenicity without focal lesions or intrahepatic biliary dilatation.  IVC:  Normal caliber.  Pancreas:  Limited visualization by ultrasound but appeared normal on the CT scan.  Spleen:  Normal size and echogenicity without focal lesions.  Right Kidney:  10.1 cm in length. Normal renal cortical thickness and echogenicity without focal lesions or hydronephrosis.  Left Kidney:  12.4 cm in length. Normal renal cortical thickness and echogenicity without focal lesions or hydronephrosis.4.7 x 3.3 x 4.0 cm simple appearing lower pole cyst.  Abdominal aorta:  Normal caliber.  IMPRESSION:  1.  Diffuse and fairly marked gallbladder wall thickening without definite gallstones.  Findings could suggest acalculous cholecystitis. 2.  Normal caliber common bile duct. 3.  No other significant abdominal findings.  Original Report Authenticated By: P. Loralie Champagne, M.D.   Ct Abdomen Pelvis W Contrast  11/30/2011  *RADIOLOGY REPORT*  Clinical Data:  Chest and abdominal pain.  CT ANGIOGRAPHY CHEST CT ABDOMEN AND PELVIS WITH CONTRAST  Technique:  Multidetector CT imaging of the chest was performed using the standard protocol during bolus administration of intravenous contrast.  Multiplanar CT image reconstructions including MIPs were obtained to evaluate the vascular anatomy. Multidetector CT imaging of the abdomen and pelvis was performed using the standard protocol during bolus administration of intravenous contrast.  Contrast: OMNIPAQUE IOHEXOL 350 MG/ML SOLN  Comparison:   None.  CTA CHEST  Findings:  The chest wall is unremarkable.  No supraclavicular or axillary lymphadenopathy.  The thyroid gland appears normal.  The bony thorax is intact.  No destructive bony  lesions or spinal canal compromise.  The heart is borderline enlarged.  No pericardial effusion.  There are mildly enlarged mediastinal lymph nodes which require follow- up.  The largest right pretracheal node measures 17 mm and there are 216 mm aorticopulmonary window nodes.  The esophagus is grossly normal.  The aorta is normal in caliber.  The pulmonary arterial tree is well opacified.  No filling defects to suggest pulmonary emboli.  Examination of the lung parenchyma demonstrates early emphysematous changes with apical bulla.  There is dependent atelectasis but no infiltrates, edema or effusions.  No worrisome nodules or masses.  The upper abdomen is grossly normal.   Review of the MIP images confirms the above findings.  IMPRESSION:  1.  No CT evidence of pulmonary emboli. 2.  Normal thoracic aorta. 3.  Mild cardiac enlargement. 4.  Mildly enlarged mediastinal lymph nodes.  I would suggest a follow-up chest CT in 4 months to reevaluate.  CT ABDOMEN AND PELVIS  Findings: The liver is unremarkable.  No focal lesions or intrahepatic biliary dilatation. No obvious gallstones.  Possible mild gallbladder wall thickening but no pericholecystic inflammatory changes.  Ultrasound may be helpful for further evaluation.  No common bile duct dilatation.  Some reflux of contrast is noted down the IVC and into the hepatic veins.  This could be due to tricuspid regurgitation.  The spleen is normal in size.  No focal lesions.  The pancreas is normal.  The adrenal glands and kidneys are normal except for left renal cyst.  The stomach, duodenum, small bowel and colon are unremarkable.  No inflammatory changes or mass lesions.  The appendix is normal.  The aorta is normal in caliber.  The major branch vessels are normal. No mesenteric or retroperitoneal masses are adenopathy.  The bladder, prostate gland and seminal vesicles are unremarkable. No pelvic mass, adenopathy or free pelvic fluid collections.  No inguinal mass or  hernia.  The bony structures are unremarkable.  Review of the MIP images confirms the above findings.  IMPRESSION:  1.  Mild gallbladder wall thickening is suspected.  No obvious gallstones or pericholecystic inflammatory change but ultrasound may be helpful for further evaluation if the patient has any right upper quadrant pain. 2.  No other significant abdominal/pelvic findings.  Original Report Authenticated By: P. Loralie Champagne, M.D.   Dg Abd Acute W/chest  11/30/2011  *RADIOLOGY REPORT*  Clinical Data: Shortness of breath.  Abdomen and back pain. Constipation.  ACUTE ABDOMEN SERIES (ABDOMEN 2 VIEW & CHEST 1 VIEW)  Comparison: Chest dated 07/26/2006.  Findings: Interval enlargement of the cardiac silhouette.  Interval small amount of linear density in both mid and lower lung zones with interval minimal prominence of the interstitial markings.  No pleural fluid.  Normal bowel gas pattern  without free peritoneal air.  Normal amount of stool.  Minimal scoliosis.  IMPRESSION:  1.  Interval cardiomegaly, pulmonary vascular congestion and mild interstitial pulmonary edema/chronic interstitial lung disease. 2.  Small amount of linear scarring or atelectasis bilaterally. 3.  No acute abdominal abnormality.  Original Report Authenticated By: Darrol Angel, M.D.    Anti-infectives: Anti-infectives     Start     Dose/Rate Route Frequency Ordered Stop   12/01/11 2245   ritonavir (NORVIR) tablet 100 mg        100 mg Oral 2 times daily 12/01/11 2232     12/01/11 1200   etravirine (INTELENCE) tablet 200 mg        200 mg Oral 2 times daily with meals 12/01/11 0931     12/01/11 1000   emtricitabine-tenofovir (TRUVADA) 200-300 MG per tablet 1 tablet        1 tablet Oral Daily 12/01/11 0931     12/01/11 0700   Darunavir Ethanolate (PREZISTA) tablet 800 mg        800 mg Oral Daily with breakfast 11/30/11 2012     11/30/11 2200   ritonavir (NORVIR) capsule 100 mg        100 mg Oral 2 times daily 11/30/11 2012      11/30/11 2100   dapsone tablet 100 mg  Status:  Discontinued        100 mg Oral Daily 11/30/11 2012 12/01/11 1044   11/30/11 2100  piperacillin-tazobactam (ZOSYN) IVPB 3.375 g       3.375 g 12.5 mL/hr over 240 Minutes Intravenous Every 8 hours 11/30/11 2012     11/30/11 1845   Ampicillin-Sulbactam (UNASYN) 3 g in sodium chloride 0.9 % 100 mL IVPB        3 g 100 mL/hr over 60 Minutes Intravenous  Once 11/30/11 1841 11/30/11 1957           Assessment/Plan  RUQ pain: per Dr. Lindie Spruce HIDA scan shows filling of the gallbladder, so no indication for surgery.  Will sign off today.     LOS: 2 days    WHITE, ELIZABETH 12/02/2011

## 2011-12-02 NOTE — Telephone Encounter (Signed)
Called patient and scheduled appt for 12/08/11 at 3:30 pm. Wendall Mola CMA

## 2011-12-02 NOTE — Progress Notes (Signed)
HIDA negative.  [name]. Gae Bon, MD, FACS [phone] [facility]

## 2011-12-02 NOTE — Progress Notes (Addendum)
Patient being discharged. Home care instructions and medications gone over. Patient verbalized understanding of instructions. Prescriptions faxed to patient 's pharmancy.

## 2011-12-02 NOTE — Discharge Summary (Signed)
DISCHARGE SUMMARY  [name]  David#: [id number]  DOB:[birth year]  Date of Admission: 11/30/2011 Date of Discharge: 12/02/2011  Attending Physician:Richard Ritchey  Patient's PCP:No primary provider on file.  Consults: Mayo Ao  Discharge Diagnoses: Present on Admission:  .SIRS (systemic inflammatory response syndrome) .Acute acalculous cholecystitis .Epigastric abdominal pain .RUQ pain .Chest pain .Acute respiratory failure with hypoxia .Hypertensive urgency .HIV (human immunodeficiency virus infection) .Alcohol abuse .Marijuana abuse .Dehydration .HEPATITIS C .Abnormal urinalysis  Hospital Course: [name] presented with RUQ/epigastric pain associated with chest discomfort, and was tachycardic on admission. There was concern for Acute acalculous cholecystitis suggested by abdominal ultrasound, which showed thickened gall bladder wall. AST was also slightly elevated at 47, while the rest of LFTs were unremarkable. [name] subsequently had CT chest/abdomen, with suggestion of gall bladder wall thickening still, and some mediastinal lymph nodes, which will need CT follow up in 4 months, p[er radiology recommendation. He was also seen by general surgery, who recommended HIDA scan, which had essentially normal findings, hence very less likelihood of acute cholecystitis. At this juncture, [name] has remained afebrile off antibiotics, and the pain has subsided to almost complete resolution, with PPI on board, hence possibility of PUD. He has elected to go home on bid dosing of ppi, and to avoid NSAIDS, follow with his PCP in the next 1-2 weeks, to consider gi referral for possible EGD if the pain persists. If he develops fever, and worsening pain in the interim, I have instructed him to call his PCP or EMS for further evaluation. He is discharged in stable condition.  Medication List  As of 12/02/2011  9:05 AM   STOP taking these medications         dapsone 100 MG tablet           TAKE these medications         darunavir 400 MG tablet   Commonly known as: PREZISTA   Take 800 mg by mouth daily with breakfast.      emtricitabine-tenofovir 200-300 MG per tablet   Commonly known as: TRUVADA   Take 1 tablet by mouth daily.      etravirine 100 MG tablet   Commonly known as: INTELENCE   Take 200 mg by mouth 2 (two) times daily with a meal.      hydrochlorothiazide 25 MG tablet   Commonly known as: HYDRODIURIL   Take 25 mg by mouth daily.      lisinopril 40 MG tablet   Commonly known as: PRINIVIL,ZESTRIL   Take 1 tablet (40 mg total) by mouth daily.      loratadine 10 MG tablet   Commonly known as: CLARITIN   Take 10 mg by mouth daily.      naftifine 1 % cream   Commonly known as: NAFTIN   Apply 1 application topically 2 (two) times daily. For 6 weeks.  Started 10/05/2011      omeprazole 20 MG capsule   Commonly known as: PRILOSEC   Take 1 capsule (20 mg total) by mouth 2 (two) times daily.      ritonavir 100 MG capsule   Commonly known as: NORVIR   Take 100 mg by mouth 2 (two) times daily.      traMADol 50 MG tablet   Commonly known as: ULTRAM   Take 1 tablet (50 mg total) by mouth every 6 (six) hours as needed for pain.             Day of Discharge BP  158/80  Pulse 100  Temp 98.2 F (36.8 C) (Oral)  Resp 18  Ht 5\' 4"  (1.626 m)  Wt 72 kg (158 lb 11.7 oz)  BMI 27.25 kg/m2  SpO2 95%  Physical Exam: At baseline.  No results found for this or any previous visit (from the past 24 hour(s)).  Disposition:home today.  Follow-up Appts: Discharge Orders    Future Orders Please Complete By Expires   Diet - low sodium heart healthy      Increase activity slowly            Time spent in discharge (includes decision making & examination of pt): 25 minutes  Signed: Rylin Seavey 12/02/2011, 9:05 AM

## 2011-12-02 NOTE — Telephone Encounter (Signed)
Message copied by Macy Mis on Wed Dec 02, 2011 12:15 PM ------      Message from: [name]      Created: Wed Dec 02, 2011 11:41 AM       Can you get Wilmar in for a hospital follow up next week.  OK to overbook if needed.  Also, he will need a PCP.  Thanks

## 2011-12-04 ENCOUNTER — Other Ambulatory Visit: Payer: Self-pay

## 2011-12-04 ENCOUNTER — Emergency Department (HOSPITAL_COMMUNITY): Payer: Medicare Other

## 2011-12-04 ENCOUNTER — Encounter (HOSPITAL_COMMUNITY): Payer: Self-pay | Admitting: *Deleted

## 2011-12-04 ENCOUNTER — Inpatient Hospital Stay (HOSPITAL_COMMUNITY)
Admission: EM | Admit: 2011-12-04 | Discharge: 2011-12-09 | DRG: 391 | Disposition: A | Discharge status: Home / Self Care | Payer: Medicare Other | Attending: Internal Medicine | Admitting: Internal Medicine

## 2011-12-04 DIAGNOSIS — R109 Unspecified abdominal pain: Secondary | ICD-10-CM | POA: Diagnosis not present

## 2011-12-04 DIAGNOSIS — J189 Pneumonia, unspecified organism: Secondary | ICD-10-CM | POA: Diagnosis not present

## 2011-12-04 DIAGNOSIS — A6000 Herpesviral infection of urogenital system, unspecified: Secondary | ICD-10-CM

## 2011-12-04 DIAGNOSIS — F1010 Alcohol abuse, uncomplicated: Secondary | ICD-10-CM

## 2011-12-04 DIAGNOSIS — J90 Pleural effusion, not elsewhere classified: Secondary | ICD-10-CM | POA: Diagnosis present

## 2011-12-04 DIAGNOSIS — I509 Heart failure, unspecified: Secondary | ICD-10-CM | POA: Diagnosis not present

## 2011-12-04 DIAGNOSIS — B1710 Acute hepatitis C without hepatic coma: Secondary | ICD-10-CM

## 2011-12-04 DIAGNOSIS — R932 Abnormal findings on diagnostic imaging of liver and biliary tract: Secondary | ICD-10-CM

## 2011-12-04 DIAGNOSIS — F1210 Cannabis abuse, uncomplicated: Secondary | ICD-10-CM

## 2011-12-04 DIAGNOSIS — I429 Cardiomyopathy, unspecified: Secondary | ICD-10-CM | POA: Diagnosis present

## 2011-12-04 DIAGNOSIS — B20 Human immunodeficiency virus [HIV] disease: Secondary | ICD-10-CM | POA: Diagnosis not present

## 2011-12-04 DIAGNOSIS — I10 Essential (primary) hypertension: Secondary | ICD-10-CM | POA: Diagnosis present

## 2011-12-04 DIAGNOSIS — J9819 Other pulmonary collapse: Secondary | ICD-10-CM | POA: Diagnosis not present

## 2011-12-04 DIAGNOSIS — R112 Nausea with vomiting, unspecified: Secondary | ICD-10-CM

## 2011-12-04 DIAGNOSIS — F1910 Other psychoactive substance abuse, uncomplicated: Secondary | ICD-10-CM

## 2011-12-04 DIAGNOSIS — Z8619 Personal history of other infectious and parasitic diseases: Secondary | ICD-10-CM

## 2011-12-04 DIAGNOSIS — R079 Chest pain, unspecified: Secondary | ICD-10-CM | POA: Diagnosis not present

## 2011-12-04 DIAGNOSIS — Z21 Asymptomatic human immunodeficiency virus [HIV] infection status: Secondary | ICD-10-CM | POA: Diagnosis present

## 2011-12-04 DIAGNOSIS — R0602 Shortness of breath: Secondary | ICD-10-CM | POA: Diagnosis not present

## 2011-12-04 HISTORY — DX: Glucose-6-phosphate dehydrogenase (G6PD) deficiency without anemia: D75.A

## 2011-12-04 HISTORY — DX: Inflammatory liver disease, unspecified: K75.9

## 2011-12-04 LAB — CBC
MCV: 83.4 fL (ref 78.0–100.0)
Platelets: 260 10*3/uL (ref 150–400)
RDW: 13.4 % (ref 11.5–15.5)
WBC: 8.1 10*3/uL (ref 4.0–10.5)

## 2011-12-04 LAB — URINE MICROSCOPIC-ADD ON

## 2011-12-04 LAB — COMPREHENSIVE METABOLIC PANEL
AST: 68 U/L — ABNORMAL HIGH (ref 0–37)
Albumin: 3.6 g/dL (ref 3.5–5.2)
Chloride: 104 mEq/L (ref 96–112)
Creatinine, Ser: 0.86 mg/dL (ref 0.50–1.35)
Potassium: 3.5 mEq/L (ref 3.5–5.1)
Total Bilirubin: 0.5 mg/dL (ref 0.3–1.2)

## 2011-12-04 LAB — URINALYSIS, ROUTINE W REFLEX MICROSCOPIC
Glucose, UA: NEGATIVE mg/dL
Ketones, ur: 15 mg/dL — AB
Leukocytes, UA: NEGATIVE
Specific Gravity, Urine: 1.024 (ref 1.005–1.030)
pH: 6 (ref 5.0–8.0)

## 2011-12-04 LAB — POCT I-STAT TROPONIN I: Troponin i, poc: 0.08 ng/mL (ref 0.00–0.08)

## 2011-12-04 MED ORDER — HYDROMORPHONE HCL PF 1 MG/ML IJ SOLN
1.0000 mg | INTRAMUSCULAR | Status: DC | PRN
  Administered 2011-12-04 – 2011-12-06 (×3): 1 mg via INTRAVENOUS
  Filled 2011-12-04 (×4): qty 1

## 2011-12-04 MED ORDER — ONDANSETRON HCL 4 MG/2ML IJ SOLN: 4.0000 mg | Freq: Three times a day (TID) | INTRAMUSCULAR | Status: DC | PRN

## 2011-12-04 MED ORDER — DARUNAVIR ETHANOLATE 800 MG PO TABS
800.0000 mg | ORAL_TABLET | Freq: Every day | ORAL | Status: DC
  Administered 2011-12-05 – 2011-12-09 (×5): 800 mg via ORAL
  Filled 2011-12-04 (×6): qty 1

## 2011-12-04 MED ORDER — ACETAMINOPHEN 325 MG PO TABS: 650.0000 mg | ORAL_TABLET | Freq: Four times a day (QID) | ORAL | Status: DC | PRN

## 2011-12-04 MED ORDER — FUROSEMIDE 10 MG/ML IJ SOLN
40.0000 mg | Freq: Two times a day (BID) | INTRAMUSCULAR | Status: DC
  Administered 2011-12-04 – 2011-12-05 (×2): 40 mg via INTRAVENOUS
  Filled 2011-12-04 (×4): qty 4

## 2011-12-04 MED ORDER — SENNOSIDES-DOCUSATE SODIUM 8.6-50 MG PO TABS
2.0000 | ORAL_TABLET | Freq: Every day | ORAL | Status: DC
  Filled 2011-12-04: qty 2

## 2011-12-04 MED ORDER — SODIUM CHLORIDE 0.9 % IJ SOLN
3.0000 mL | Freq: Two times a day (BID) | INTRAMUSCULAR | Status: DC
  Administered 2011-12-04 – 2011-12-08 (×4): 3 mL via INTRAVENOUS

## 2011-12-04 MED ORDER — LORATADINE 10 MG PO TABS
10.0000 mg | ORAL_TABLET | Freq: Every day | ORAL | Status: DC
  Administered 2011-12-05 – 2011-12-09 (×5): 10 mg via ORAL
  Filled 2011-12-04 (×5): qty 1

## 2011-12-04 MED ORDER — TRAMADOL HCL 50 MG PO TABS
50.0000 mg | ORAL_TABLET | Freq: Four times a day (QID) | ORAL | Status: DC | PRN
  Administered 2011-12-08: 50 mg via ORAL
  Filled 2011-12-04 (×2): qty 1

## 2011-12-04 MED ORDER — PANTOPRAZOLE SODIUM 40 MG IV SOLR
40.0000 mg | INTRAVENOUS | Status: DC
  Filled 2011-12-04: qty 40

## 2011-12-04 MED ORDER — ACETAMINOPHEN 650 MG RE SUPP: 650.0000 mg | Freq: Four times a day (QID) | RECTAL | Status: DC | PRN

## 2011-12-04 MED ORDER — ONDANSETRON HCL 4 MG PO TABS
4.0000 mg | ORAL_TABLET | Freq: Four times a day (QID) | ORAL | Status: DC | PRN
  Administered 2011-12-09: 4 mg via ORAL
  Filled 2011-12-04 (×2): qty 1

## 2011-12-04 MED ORDER — PIPERACILLIN-TAZOBACTAM 3.375 G IVPB 30 MIN
3.3750 g | Freq: Once | INTRAVENOUS | Status: AC
  Administered 2011-12-04: 3.375 g via INTRAVENOUS
  Filled 2011-12-04: qty 50

## 2011-12-04 MED ORDER — ETRAVIRINE 100 MG PO TABS
200.0000 mg | ORAL_TABLET | Freq: Two times a day (BID) | ORAL | Status: DC
Start: 2011-12-04 — End: 2011-12-09
  Administered 2011-12-04 – 2011-12-09 (×10): 200 mg via ORAL
  Filled 2011-12-04 (×12): qty 2

## 2011-12-04 MED ORDER — EMTRICITABINE-TENOFOVIR DF 200-300 MG PO TABS
1.0000 | ORAL_TABLET | Freq: Every day | ORAL | Status: DC
  Administered 2011-12-04 – 2011-12-09 (×6): 1 via ORAL
  Filled 2011-12-04 (×7): qty 1

## 2011-12-04 MED ORDER — MAGNESIUM HYDROXIDE 400 MG/5ML PO SUSP: 30.0000 mL | Freq: Once | ORAL | Status: DC

## 2011-12-04 MED ORDER — SODIUM CHLORIDE 0.9 % IV SOLN: INTRAVENOUS | Status: AC

## 2011-12-04 MED ORDER — ONDANSETRON HCL 4 MG/2ML IJ SOLN
4.0000 mg | Freq: Four times a day (QID) | INTRAMUSCULAR | Status: DC | PRN
  Administered 2011-12-05 – 2011-12-08 (×5): 4 mg via INTRAVENOUS
  Filled 2011-12-04 (×4): qty 2

## 2011-12-04 MED ORDER — RITONAVIR 100 MG PO TABS
100.0000 mg | ORAL_TABLET | Freq: Two times a day (BID) | ORAL | Status: DC
  Administered 2011-12-04 – 2011-12-06 (×5): 100 mg via ORAL
  Filled 2011-12-04 (×7): qty 1

## 2011-12-04 MED ORDER — PIPERACILLIN-TAZOBACTAM 3.375 G IVPB
3.3750 g | Freq: Three times a day (TID) | INTRAVENOUS | Status: DC
  Administered 2011-12-05 – 2011-12-07 (×7): 3.375 g via INTRAVENOUS
  Filled 2011-12-04 (×11): qty 50

## 2011-12-04 MED ORDER — MORPHINE SULFATE 4 MG/ML IJ SOLN: 4.0000 mg | INTRAMUSCULAR | Status: DC | PRN

## 2011-12-04 NOTE — ED Notes (Signed)
PA made aware of pt refusing to be stuck again for ABG

## 2011-12-04 NOTE — Progress Notes (Addendum)
Patient with HIV but Viral load undetectable and CD4 count of 500, recently in house DC ed 7/3 with SIRS - now back ? HCAP - stable LAZA,SORIN   Patient appears very stable and energetic.  Primary complaints are:  Constipation and severe epigastric pain after eating fatty foods (ice cream, ribs). Will give milk of mag and protonix.  Hyda 12/01/11 negative.  U/S appears to show acalculous cholecystitis.  Surgery was consulted during last hospitalization Algis Downs, [place] [facility] Pager: [phone]

## 2011-12-04 NOTE — ED Notes (Signed)
PA at bedside.

## 2011-12-04 NOTE — Progress Notes (Signed)
[name] [id number] Code Status: Full   Admission Data: 12/04/2011 7:59 PM Attending Provider:  Jomarie Longs PCP:No primary provider on file. Consults/ Treatment Team: Treatment Team:  Hilarie Fredrickson, MD  [name] is a 46 y.o. male patient admitted from ED awake, alert - oriented  X 3 - no acute distress noted.  VSS - Blood pressure 165/137, pulse 111, temperature 98 F (36.7 C), temperature source Oral, resp. rate 20, height 5\' 4"  (1.626 m), weight 68.04 kg (150 lb), SpO2 92.00%.  no c/o shortness of breath, no c/o chest pain. Cardiac tele # [phone], in place, cardiac monitor yields:sinus tachycardia. O2:   @ NA l/min per NA. IV Fluids:  IV in place, occlusive dsg intact without redness, IV cath antecubital right, condition patent and no redness normal saline.  Allergies:   Allergies  Allergen Reactions  . Bactrim Rash     Past Medical History  Diagnosis Date  . Hypertension   . HIV infection   . Hepatitis     History:  obtained from the patient. Tobacco/alcohol: denied social drinker  Orientation to room, and floor completed with information packet given to patient/family.  Patient declined safety video at this time.  Admission INP armband ID verified with patient/family, and in place.   SR up x 2, fall assessment complete, with patient and family able to verbalize understanding of risk associated with falls, and verbalized understanding to call nsg before up out of bed.  Call light within reach, patient able to voice, and demonstrate understanding.  Skin, clean-dry- intact without evidence of bruising, or skin tears.   No evidence of skin break down noted on exam.     Will cont to eval and treat per MD orders.  Susann Givens, RN, [facility] 12/04/2011 7:59 PM

## 2011-12-04 NOTE — ED Notes (Addendum)
Patient was seen here on Monday due to constipation and abd pain.  He was inpatient mon thru wed.  Patient states on yesterday he took the meds as directed.  Patient states he ate only a small amount last night.  He states today he is having abd pain and sob.  He is also having constipation.  Patient states he has new onset of side pain.  He states his sx are worse today than prior visit.  Patient denies chest pain but states he is weak and nauseated.  He points to mid abd as source of pain

## 2011-12-04 NOTE — Progress Notes (Signed)
ANTIBIOTIC CONSULT NOTE - INITIAL  Pharmacy Consult for zosyn Indication: HCAP (possibly MDR pathogens)  Allergies  Allergen Reactions  . Bactrim Rash    Patient Measurements: Height: 5\' 4"  (162.6 cm) Weight: 150 lb (68.04 kg) IBW/kg (Calculated) : 59.2   Vital Signs: Temp: 98.3 F (36.8 C) (07/05 1225) Temp src: Oral (07/05 1225) BP: 157/122 mmHg (07/05 1409) Pulse Rate: 110  (07/05 1409) Intake/Output from previous day:   Intake/Output from this shift:    Labs:  Basename 12/04/11 1419  WBC 8.1  HGB 12.3*  PLT 260  LABCREA --  CREATININE 0.86   Estimated Creatinine Clearance: 88 ml/min (by C-G formula based on Cr of 0.86). No results found for this basename: VANCOTROUGH:2,VANCOPEAK:2,VANCORANDOM:2,GENTTROUGH:2,GENTPEAK:2,GENTRANDOM:2,TOBRATROUGH:2,TOBRAPEAK:2,TOBRARND:2,AMIKACINPEAK:2,AMIKACINTROU:2,AMIKACIN:2, in the last 72 hours   Microbiology: Recent Results (from the past 720 hour(s))  URINE CULTURE     Status: Normal   Collection Time   11/30/11 12:23 PM      Component Value Range Status Comment   Specimen Description URINE, CLEAN CATCH   Final    Special Requests ADDED 11/30/11 1423   Final    Culture  Setup Time 12/01/2011 00:37   Final    Colony Count NO GROWTH   Final    Culture NO GROWTH   Final    Report Status 12/01/2011 FINAL   Final   MRSA PCR SCREENING     Status: Normal   Collection Time   11/30/11  8:09 PM      Component Value Range Status Comment   MRSA by PCR NEGATIVE  NEGATIVE Final     Medical History: Past Medical History  Diagnosis Date  . Hypertension   . HIV infection    Assessment: 46 yom with history of HIV presents to the ED with shortness of breath, constipation and weakness. He had a recent admission for SIRS and abdominal pain but HIDA scan was normal. He was discharged on 7/3. Today he has similar complaints as previous admission. Chest x-ray reports possibly pneumonia. To start empiric zosyn for HCAP with possible MDR  pathogens. No further antibiotics ordered at this time. Pt is afebrile with a WBC WNL.   Plan:  1. Zosyn 3.375gm IV Q8H (4 hour infusion) 2. F/u C&S, renal fxn 3. F/u further antibiotic coverage  Veasna Santibanez, Drake Leach 12/04/2011,4:19 PM

## 2011-12-04 NOTE — ED Notes (Signed)
Luisa Hart, RT at bedside to do ABG

## 2011-12-04 NOTE — ED Provider Notes (Signed)
History     CSN: [id number]  Arrival date & time 12/04/11  1214   First MD Initiated Contact with Patient 12/04/11 1356      Chief Complaint  Patient presents with  . Shortness of Breath  . Constipation  . Weakness    (Consider location/radiation/quality/duration/timing/severity/associated sxs/prior treatment) The history is provided by the patient.   46 year old male with history of HIV disease undetectable viral load and CD4 count >500 3 months ago, and recent hospital admission on 7/1 for SIRS, suspected cholecystitis but normal HIDA scan, dehydration and acute respiratory failure with hypoxia with discharge home on 12/02/2011 now presents to the emergency department with persistence of his prior presenting complaints. He reports generalized weakness that is unchanged compared to prior visit such that he feels he does not have strength for daily activities. Denies any focal weakness. Denies any head injury. Denies any fever. He has had persistent shortness of breath, worse with exertion with no associated chest pain. He has not had a significant associated cough. Also has persistent RUQ and epigastric pain that were present previously but he feels they are worse now, worse with eating, assoc with nausea but no emesis. Last BM today, but he feels constipated. He reports taking all prescribed medications as directed.   Past Medical History  Diagnosis Date  . Hypertension   . HIV infection     Past Surgical History  Procedure Date  . Finger surgery     Family History  Problem Relation Age of Onset  . Cancer Mother     History  Substance Use Topics  . Smoking status: Never Smoker   . Smokeless tobacco: Never Used  . Alcohol Use: 3.5 oz/week    7 drink(s) per week     socially      Review of Systems 10 systems reviewed and are negative for acute change except as noted in the HPI.  Allergies  Bactrim  Home Medications   Current Outpatient Rx  Name Route Sig  Dispense Refill  . DARUNAVIR ETHANOLATE 400 MG PO TABS Oral Take 800 mg by mouth daily with breakfast.    . EMTRICITABINE-TENOFOVIR 200-300 MG PO TABS Oral Take 1 tablet by mouth daily.    Marland Kitchen ETRAVIRINE 100 MG PO TABS Oral Take 200 mg by mouth 2 (two) times daily with a meal.    . HYDROCHLOROTHIAZIDE 25 MG PO TABS Oral Take 25 mg by mouth daily.    Marland Kitchen LISINOPRIL 40 MG PO TABS Oral Take 1 tablet (40 mg total) by mouth daily. 30 tablet 3  . LORATADINE 10 MG PO TABS Oral Take 10 mg by mouth daily.      Marland Kitchen NAFTIFINE HCL 1 % EX CREA Topical Apply 1 application topically 2 (two) times daily. For 6 weeks.  Started 10/05/2011    . OMEPRAZOLE 20 MG PO CPDR Oral Take 1 capsule (20 mg total) by mouth 2 (two) times daily. 30 capsule 1  . RITONAVIR 100 MG PO CAPS Oral Take 100 mg by mouth 2 (two) times daily.    . TRAMADOL HCL 50 MG PO TABS Oral Take 1 tablet (50 mg total) by mouth every 6 (six) hours as needed for pain. 15 tablet 0    BP 157/122  Pulse 110  Temp 98.3 F (36.8 C) (Oral)  Resp 22  Ht 5\' 4"  (1.626 m)  Wt 150 lb (68.04 kg)  BMI 25.75 kg/m2  SpO2 95%  Physical Exam  Constitutional: He is oriented to person, place,  and time. He appears well-developed and well-nourished.       Pt appears uncomfortable, anxious. VS reviewed and are sig for tachycardia and hypertension. PulseOx 94% on room air at the time of my initial assessment.  HENT:  Head: Normocephalic and atraumatic.  Right Ear: External ear normal.  Left Ear: External ear normal.       MMM  Eyes: Conjunctivae and EOM are normal. Pupils are equal, round, and reactive to light.  Neck: Neck supple.  Cardiovascular: Normal rate, regular rhythm and normal heart sounds.        Bilateral radial and DP pulses are 2+  Pulmonary/Chest: He has decreased breath sounds in the right lower field. He has no wheezes. He has no rhonchi.       Increased WOB evident without accessory muscle use. Able to speak in complete sentences. Slight tachypnea  with rate in low 20s.  Abdominal:       Abd distended but soft. Bowel sounds normal. TTP in epigastric region and RUQ. +guarding.  Musculoskeletal:       Calves are supple and non-tender  Neurological: He is alert and oriented to person, place, and time. No cranial nerve deficit (3-12 intact).       Speech clear. MAEW with 5/5 strength to all extremities. Gait normal, though he becomes SOB with ambulation.  Skin: Skin is warm and dry. He is not diaphoretic.  Psychiatric:       Appears very frustrated    ED Course  Procedures (including critical care time)  Labs Reviewed  CBC - Abnormal; Notable for the following:    Hemoglobin 12.3 (*)     HCT 36.6 (*)     All other components within normal limits  COMPREHENSIVE METABOLIC PANEL - Abnormal; Notable for the following:    AST 68 (*)     All other components within normal limits  URINALYSIS, ROUTINE W REFLEX MICROSCOPIC - Abnormal; Notable for the following:    Hgb urine dipstick SMALL (*)     Bilirubin Urine SMALL (*)     Ketones, ur 15 (*)     Protein, ur >300 (*)     All other components within normal limits  URINE MICROSCOPIC-ADD ON - Abnormal; Notable for the following:    Casts HYALINE CASTS (*)     All other components within normal limits  LIPASE, BLOOD  POCT I-STAT TROPONIN I   Dg Chest 2 View  12/04/2011  **ADDENDUM** CREATED: 12/04/2011 15:34:03  There is a more recent chest radiograph from an acute abdomen series dated 11/30/2011 for comparison.  The currently demonstrated enlarged cardiac silhouette and prominent pulmonary vasculature and interstitial markings were previously present.  The currently demonstrated bilateral pleural fluid and right basilar opacity were not previously present.  **END ADDENDUM** SIGNED BY: Londell Moh. Azucena Kuba, M.D.   12/04/2011  *RADIOLOGY REPORT*  Clinical Data: Chest pain and shortness of breath.  Smoker. Abdominal pain.  Nausea, vomiting and diarrhea.  CHEST - 2 VIEW  Comparison: Previous  examinations.  Findings: Enlarged cardiac silhouette, not present on 07/26/2006. Patchy and linear density at the right lung base.  Small bilateral pleural effusions.  Increased prominence of the pulmonary vasculature and interstitial markings.  Unremarkable bones.  IMPRESSION:  1.  Interval cardiomegaly and mild changes of congestive heart failure with bilateral pleural effusions. 2.  Right basilar atelectasis and possible pneumonia.  Original Report Authenticated By: Darrol Angel, M.D.   Dg Abd 2 Views  12/04/2011  *RADIOLOGY REPORT*  Clinical Data: Chest pain.  Shortness of breath.  Abdominal pain. Nausea and vomiting.  Smoker.  History of HIV.  ABDOMEN - 2 VIEW  Comparison: Acute abdomen series 11/30/2011.  MRI abdomen 10/24/2008.  Findings: Bowel gas pattern unremarkable without evidence of obstruction or significant ileus.  No evidence of free air or significant air fluid levels on the erect image.  Phleboliths in the pelvis.  No visible opaque urinary tract calculi.  Mild degenerative changes involving the lumbar spine.  IMPRESSION: No acute abdominal abnormality.  Original Report Authenticated By: Arnell Sieving, M.D.     1. HCAP (healthcare-associated pneumonia)   2. Pleural effusion associated with pulmonary infection       MDM  HIV pt, recent d/c from hospital with no specific findings after prior similar presentation. Today with RLL pneumonia and peripneumonic effusion seen on CXR. Labs reviewed with stable anemia, slight AST elevation (could be related to alcohol use, cholelithiasis/cholecystitis less likely given normal HIDA scan several days ago with no fever or .leukocytosis today). Proteinuria seen on prior presentation. Given risk for poor response to outpatient treatment with baseline disease, will admit to hospital for tx and monitoring. IV abx ordered. Spoke with Dr Lavera Guise with [facility], temp orders placed. Pt informed of results and plan.        Shaaron Adler, [place] 12/04/11 [phone]

## 2011-12-04 NOTE — ED Notes (Signed)
Dr.Sheldon to eval ecg at 12:25

## 2011-12-04 NOTE — H&P (Signed)
PCP: / ID: Dr.Comer  Chief Complaint:  Abdominal pain  HPI: [name] is a 46/M with h/o HTN, HIV on HAART, undetectable viral load and CD4 of 500 (4/13), discharged from [facility] hospital on 7/3 after admission for abdominal pain originally suspected to be from Acalculous cholecystitis then underwent a HIDA scan which was unremarkable and was discharged home after surgical evaluation on Protonix BID, after going home he continued to have persistent RUQ and epigastric abdominal pain, he describes the pain as severe, sometimes cramping, non radiating, worsened today after eating ice cream. In addition he also reports multiple loose stool yesterday and today, today he noticed some blood when he wiped on toilet paper. He denies any fevers/chills. He also reports increased shortness of breath ever since he got admitted and associated Orthopnea, he denies cough, congestion, fevers. Upon evaluation in ER he was noted to have unremarkable LFTs, and CXR with Pulm vascular congestion, pleural effusions, and atelectasis vs pneumonia  Allergies:   Allergies  Allergen Reactions  . Bactrim Rash      Past Medical History  Diagnosis Date  . Hypertension   . HIV infection   . Hepatitis     Past Surgical History  Procedure Date  . Finger surgery     Prior to Admission medications   Medication Sig Start Date End Date Taking? Authorizing Provider  darunavir (PREZISTA) 400 MG tablet Take 800 mg by mouth daily with breakfast.   Yes Historical Provider, MD  emtricitabine-tenofovir (TRUVADA) 200-300 MG per tablet Take 1 tablet by mouth daily.   Yes Historical Provider, MD  etravirine (INTELENCE) 100 MG tablet Take 200 mg by mouth 2 (two) times daily with a meal.   Yes Historical Provider, MD  hydrochlorothiazide (HYDRODIURIL) 25 MG tablet Take 25 mg by mouth daily. 04/15/11  Yes [name], MD  lisinopril (PRINIVIL,ZESTRIL) 40 MG tablet Take 1 tablet (40 mg total) by mouth daily. 04/15/11  Yes [name], MD  loratadine (CLARITIN) 10 MG tablet Take 10 mg by mouth daily.     Yes Historical Provider, MD  naftifine (NAFTIN) 1 % cream Apply 1 application topically 2 (two) times daily. For 6 weeks.  Started 10/05/2011 10/05/11  Yes Historical Provider, MD  omeprazole (PRILOSEC) 20 MG capsule Take 1 capsule (20 mg total) by mouth 2 (two) times daily. 12/02/11  Yes Simbiso Ranga, MD  ritonavir (NORVIR) 100 MG capsule Take 100 mg by mouth 2 (two) times daily.   Yes Historical Provider, MD  traMADol (ULTRAM) 50 MG tablet Take 1 tablet (50 mg total) by mouth every 6 (six) hours as needed for pain. 12/02/11 12/12/11 Yes Simbiso Ranga, MD    Social History:  reports that he has never smoked. He has never used smokeless tobacco. He reports that he drinks about 3.5 ounces of alcohol per week. He reports that he uses illicit drugs about 7 times per week.  Family History  Problem Relation Age of Onset  . Cancer Mother     Review of Systems:  Constitutional: Denies fever, chills, diaphoresis, appetite change and fatigue.  HEENT: Denies photophobia, eye pain, redness, hearing loss, ear pain, congestion, sore throat, rhinorrhea, sneezing, mouth sores, trouble swallowing, neck pain, neck stiffness and tinnitus.   Respiratory: Denies SOB, DOE, cough, chest tightness,  and wheezing.   Cardiovascular: Denies chest pain, palpitations and leg swelling.  Gastrointestinal: Denies nausea, vomiting, abdominal pain, diarrhea, constipation, blood in stool and abdominal distention.  Genitourinary: Denies dysuria, urgency, frequency, hematuria, flank pain  and difficulty urinating.  Musculoskeletal: Denies myalgias, back pain, joint swelling, arthralgias and gait problem.  Skin: Denies pallor, rash and wound.  Neurological: Denies dizziness, seizures, syncope, weakness, light-headedness, numbness and headaches.  Hematological: Denies adenopathy. Easy bruising, personal or family bleeding history  Psychiatric/Behavioral:  Denies suicidal ideation, mood changes, confusion, nervousness, sleep disturbance and agitation   Physical Exam: Blood pressure 151/120, pulse 102, temperature 98.3 F (36.8 C), temperature source Oral, resp. rate 20, height 5\' 4"  (1.626 m), weight 68.04 kg (150 lb), SpO2 93.00%. Gen: AAOx3, no distress HEENT: PERRLA, EOMI, oral mucosa moist and pink CVS: S1S2/RRR, no m/r/g Lungs: decreased BS at bases Abd: soft, tender in RUQ and epigastrium, BS present Ext: no edema/c/c Neuro: moves all extremities no localising signs Skin: no rashes/skin breakdown  Labs on Admission:  Results for orders placed during the hospital encounter of 12/04/11 (from the past 48 hour(s))  CBC     Status: Abnormal   Collection Time   12/04/11  2:19 PM      Component Value Range Comment   WBC 8.1  4.0 - 10.5 K/uL    RBC 4.39  4.22 - 5.81 MIL/uL    Hemoglobin 12.3 (*) 13.0 - 17.0 g/dL    HCT 16.1 (*) 09.6 - 52.0 %    MCV 83.4  78.0 - 100.0 fL    MCH 28.0  26.0 - 34.0 pg    MCHC 33.6  30.0 - 36.0 g/dL    RDW 04.5  40.9 - 81.1 %    Platelets 260  150 - 400 K/uL   COMPREHENSIVE METABOLIC PANEL     Status: Abnormal   Collection Time   12/04/11  2:19 PM      Component Value Range Comment   Sodium 141  135 - 145 mEq/L    Potassium 3.5  3.5 - 5.1 mEq/L    Chloride 104  96 - 112 mEq/L    CO2 20  19 - 32 mEq/L    Glucose, Bld 88  70 - 99 mg/dL    BUN 10  6 - 23 mg/dL    Creatinine, Ser 9.14  0.50 - 1.35 mg/dL    Calcium 9.4  8.4 - 78.2 mg/dL    Total Protein 7.5  6.0 - 8.3 g/dL    Albumin 3.6  3.5 - 5.2 g/dL    AST 68 (*) 0 - 37 U/L    ALT 44  0 - 53 U/L    Alkaline Phosphatase 91  39 - 117 U/L    Total Bilirubin 0.5  0.3 - 1.2 mg/dL    GFR calc non Af Amer >90  >90 mL/min    GFR calc Af Amer >90  >90 mL/min   LIPASE, BLOOD     Status: Normal   Collection Time   12/04/11  2:19 PM      Component Value Range Comment   Lipase 24  11 - 59 U/L   URINALYSIS, ROUTINE W REFLEX MICROSCOPIC     Status: Abnormal     Collection Time   12/04/11  2:51 PM      Component Value Range Comment   Color, Urine YELLOW  YELLOW    APPearance CLEAR  CLEAR    Specific Gravity, Urine 1.024  1.005 - 1.030    pH 6.0  5.0 - 8.0    Glucose, UA NEGATIVE  NEGATIVE mg/dL    Hgb urine dipstick SMALL (*) NEGATIVE    Bilirubin Urine SMALL (*) NEGATIVE  Ketones, ur 15 (*) NEGATIVE mg/dL    Protein, ur >409 (*) NEGATIVE mg/dL    Urobilinogen, UA 1.0  0.0 - 1.0 mg/dL    Nitrite NEGATIVE  NEGATIVE    Leukocytes, UA NEGATIVE  NEGATIVE   POCT I-STAT TROPONIN I     Status: Normal   Collection Time   12/04/11  2:51 PM      Component Value Range Comment   Troponin i, poc 0.08  0.00 - 0.08 ng/mL    Comment 3            URINE MICROSCOPIC-ADD ON     Status: Abnormal   Collection Time   12/04/11  2:51 PM      Component Value Range Comment   Squamous Epithelial / LPF RARE  RARE    WBC, UA 0-2  <3 WBC/hpf    RBC / HPF 0-2  <3 RBC/hpf    Bacteria, UA RARE  RARE    Casts HYALINE CASTS (*) NEGATIVE    Urine-Other MUCOUS PRESENT       Radiological Exams on Admission: Dg Chest 2 View  12/04/2011  **ADDENDUM** CREATED: 12/04/2011 15:34:03  There is a more recent chest radiograph from an acute abdomen series dated 11/30/2011 for comparison.  The currently demonstrated enlarged cardiac silhouette and prominent pulmonary vasculature and interstitial markings were previously present.  The currently demonstrated bilateral pleural fluid and right basilar opacity were not previously present.  **END ADDENDUM** SIGNED BY: Londell Moh. Azucena Kuba, M.D.   12/04/2011  *RADIOLOGY REPORT*  Clinical Data: Chest pain and shortness of breath.  Smoker. Abdominal pain.  Nausea, vomiting and diarrhea.  CHEST - 2 VIEW  Comparison: Previous examinations.  Findings: Enlarged cardiac silhouette, not present on 07/26/2006. Patchy and linear density at the right lung base.  Small bilateral pleural effusions.  Increased prominence of the pulmonary vasculature and interstitial  markings.  Unremarkable bones.  IMPRESSION:  1.  Interval cardiomegaly and mild changes of congestive heart failure with bilateral pleural effusions. 2.  Right basilar atelectasis and possible pneumonia.  Original Report Authenticated By: Darrol Angel, M.D.   Dg Abd 2 Views  12/04/2011  *RADIOLOGY REPORT*  Clinical Data: Chest pain.  Shortness of breath.  Abdominal pain. Nausea and vomiting.  Smoker.  History of HIV.  ABDOMEN - 2 VIEW  Comparison: Acute abdomen series 11/30/2011.  MRI abdomen 10/24/2008.  Findings: Bowel gas pattern unremarkable without evidence of obstruction or significant ileus.  No evidence of free air or significant air fluid levels on the erect image.  Phleboliths in the pelvis.  No visible opaque urinary tract calculi.  Mild degenerative changes involving the lumbar spine.  IMPRESSION: No acute abdominal abnormality.  Original Report Authenticated By: Arnell Sieving, M.D.    Assessment/Plan 1. Abdominal pain, CT Abd/pelvis, USG on 7/1 were concerning for acalculous cholecystitis, but HIDA unremarkable, evaluated by Surgery 7/1-7/3 Lipase normal Peptic ulcer disease also a possibility Supportive care with IV PPI, Clear liq diet GI consulted /D/w Dr.Perry covering for Dr.Hung 2. CHF suspected clinically and based on chest Xray findings Check stat BNP Lasix 40mg   IV Q12 Strict I/Os, weights Check 2D echo 3. Doubt Pneumonia: clinically suspect pulmonary symptoms to be secondary to CHF 4. HIV on HAART, undetectable viral load and CD4 count of 500 from 4/13 5. HTN: stable, hold ACE inhibitor   Time Spent on Admission:  Jefferey Lippmann [facility] Pager: [phone] 12/04/2011, 6:06 PM

## 2011-12-05 ENCOUNTER — Inpatient Hospital Stay (HOSPITAL_COMMUNITY): Payer: Medicare Other

## 2011-12-05 DIAGNOSIS — R112 Nausea with vomiting, unspecified: Secondary | ICD-10-CM | POA: Diagnosis not present

## 2011-12-05 DIAGNOSIS — R932 Abnormal findings on diagnostic imaging of liver and biliary tract: Secondary | ICD-10-CM

## 2011-12-05 DIAGNOSIS — R079 Chest pain, unspecified: Secondary | ICD-10-CM | POA: Diagnosis not present

## 2011-12-05 DIAGNOSIS — I10 Essential (primary) hypertension: Secondary | ICD-10-CM

## 2011-12-05 DIAGNOSIS — R1013 Epigastric pain: Secondary | ICD-10-CM | POA: Diagnosis not present

## 2011-12-05 DIAGNOSIS — J90 Pleural effusion, not elsewhere classified: Secondary | ICD-10-CM | POA: Diagnosis not present

## 2011-12-05 DIAGNOSIS — I509 Heart failure, unspecified: Secondary | ICD-10-CM | POA: Diagnosis not present

## 2011-12-05 DIAGNOSIS — J189 Pneumonia, unspecified organism: Secondary | ICD-10-CM | POA: Diagnosis not present

## 2011-12-05 DIAGNOSIS — R109 Unspecified abdominal pain: Secondary | ICD-10-CM | POA: Diagnosis not present

## 2011-12-05 LAB — COMPREHENSIVE METABOLIC PANEL
ALT: 57 U/L — ABNORMAL HIGH (ref 0–53)
AST: 82 U/L — ABNORMAL HIGH (ref 0–37)
Alkaline Phosphatase: 89 U/L (ref 39–117)
CO2: 24 mEq/L (ref 19–32)
Chloride: 106 mEq/L (ref 96–112)
GFR calc Af Amer: >90 mL/min (ref >90)
GFR calc non Af Amer: 80 mL/min — ABNORMAL LOW (ref >90)
Glucose, Bld: 104 mg/dL — ABNORMAL HIGH (ref 70–99)
Potassium: 3.4 mEq/L — ABNORMAL LOW (ref 3.5–5.1)
Sodium: 144 mEq/L (ref 135–145)

## 2011-12-05 LAB — CBC
Hemoglobin: 11.8 g/dL — ABNORMAL LOW (ref 13.0–17.0)
RBC: 4.24 MIL/uL (ref 4.22–5.81)
WBC: 7.3 10*3/uL (ref 4.0–10.5)

## 2011-12-05 MED ORDER — LEVOFLOXACIN IN D5W 750 MG/150ML IV SOLN
750.0000 mg | INTRAVENOUS | Status: DC
  Administered 2011-12-05 – 2011-12-08 (×4): 750 mg via INTRAVENOUS
  Filled 2011-12-05 (×5): qty 150

## 2011-12-05 MED ORDER — BISACODYL 10 MG RE SUPP: 10.0000 mg | Freq: Every day | RECTAL | Status: DC | PRN

## 2011-12-05 MED ORDER — CARVEDILOL 3.125 MG PO TABS
3.1250 mg | ORAL_TABLET | Freq: Two times a day (BID) | ORAL | Status: DC
  Administered 2011-12-05 – 2011-12-06 (×2): 3.125 mg via ORAL
  Filled 2011-12-05 (×4): qty 1

## 2011-12-05 MED ORDER — ENOXAPARIN SODIUM 40 MG/0.4ML ~~LOC~~ SOLN
40.0000 mg | SUBCUTANEOUS | Status: DC
  Administered 2011-12-05: 40 mg via SUBCUTANEOUS
  Filled 2011-12-05 (×2): qty 0.4

## 2011-12-05 MED ORDER — FUROSEMIDE 10 MG/ML IJ SOLN
40.0000 mg | Freq: Every day | INTRAMUSCULAR | Status: DC
  Filled 2011-12-05: qty 4

## 2011-12-05 MED ORDER — PANTOPRAZOLE SODIUM 40 MG IV SOLR
40.0000 mg | Freq: Two times a day (BID) | INTRAVENOUS | Status: DC
  Administered 2011-12-05 – 2011-12-07 (×5): 40 mg via INTRAVENOUS
  Filled 2011-12-05 (×8): qty 40

## 2011-12-05 MED ORDER — ZOLPIDEM TARTRATE 5 MG PO TABS
5.0000 mg | ORAL_TABLET | Freq: Every evening | ORAL | Status: DC | PRN
  Administered 2011-12-05 – 2011-12-08 (×4): 5 mg via ORAL
  Filled 2011-12-05 (×4): qty 1

## 2011-12-05 MED ORDER — LISINOPRIL 10 MG PO TABS
10.0000 mg | ORAL_TABLET | Freq: Every day | ORAL | Status: DC
  Administered 2011-12-05 – 2011-12-06 (×2): 10 mg via ORAL
  Filled 2011-12-05 (×3): qty 1

## 2011-12-05 NOTE — Consult Note (Signed)
Referring Provider: [facility] hospitalist Primary Care Physician:  No primary provider on file. Primary Gastroenterologist: none  Reason for Consultation:  Epigastric pain  HPI: [name] is a 46 y.o. male  with history of HIV, currently on HAART therapy. His CD4 count is 500. He also has history of hypertension and congestive heart failure. He he has hepatitis C, history of polysubstance abuse as well. Patient was recently hospitalized and discharged on 12/02/2011 after admission for acute epigastric pain He was worked up by the surgeons at that time, and had CT scan of the abdomen and pelvis on 11/30/2011 which showed mediastinal adenopathy, of gallbladder wall thickening but no gallstones and in otherwise negative exam. Upper abdominal ultrasound was done on 11/30/2011 showing marked gallbladder wall thickening and some fluid surrounding the gallbladder, common bile duct was 3.5 mm. Subsequent HIDA scan was done and was negative. Patient was initially treated with IV antibiotics, considered for laparoscopic cholecystectomy however when the HIDA scan was negative he was discharged home on Protonix 40 mg by mouth twice daily.  Patient is readmitted yesterday with persistent epigastric pain. He has been afebrile, WBC today is 7.3 hemoglobin 11.8 hematocrit of 35.5, potassium 3.4. LFTs to show a mild transaminitis with an AST of 82 and ALT of 57. Lipase is 24. BNP is markedly elevated at 16,000. He is also been found to have a pneumonia, and has been started on Zosyn and Levaquin.  Patient states that his pain began last Sunday after he had eaten some pizza. He says he had had constant pain all week since then primarily located in the epigastrium. He has had associated nausea and intermittent vomiting without hematemesis. He has had no fever or chills and no change in sweats which he does have intermittently. He has not had any ongoing diarrhea and no melena or hematochezia. Patient does not use any  aspirin or NSAIDs regularly. He does use some alcohol. He denies any dysphagia or odynophagia. He says he does stay on an acid blocker at home for reflux. Today he is felt a little bit better though he does admit that his stomach bothers him more after eat. He did eat solid food for breakfast and is wanting to eat solid food again for lunch.    Past Medical History  Diagnosis Date  . Hypertension   . HIV infection   . Hepatitis     Past Surgical History  Procedure Date  . Finger surgery     Prior to Admission medications   Medication Sig Start Date End Date Taking? Authorizing Provider  darunavir (PREZISTA) 400 MG tablet Take 800 mg by mouth daily with breakfast.   Yes Historical Provider, MD  emtricitabine-tenofovir (TRUVADA) 200-300 MG per tablet Take 1 tablet by mouth daily.   Yes Historical Provider, MD  etravirine (INTELENCE) 100 MG tablet Take 200 mg by mouth 2 (two) times daily with a meal.   Yes Historical Provider, MD  hydrochlorothiazide (HYDRODIURIL) 25 MG tablet Take 25 mg by mouth daily. 04/15/11  Yes [name], MD  lisinopril (PRINIVIL,ZESTRIL) 40 MG tablet Take 1 tablet (40 mg total) by mouth daily. 04/15/11  Yes [name], MD  loratadine (CLARITIN) 10 MG tablet Take 10 mg by mouth daily.     Yes Historical Provider, MD  naftifine (NAFTIN) 1 % cream Apply 1 application topically 2 (two) times daily. For 6 weeks.  Started 10/05/2011 10/05/11  Yes Historical Provider, MD  omeprazole (PRILOSEC) 20 MG capsule Take 1 capsule (  20 mg total) by mouth 2 (two) times daily. 12/02/11  Yes Simbiso Ranga, MD  ritonavir (NORVIR) 100 MG capsule Take 100 mg by mouth 2 (two) times daily.   Yes Historical Provider, MD  traMADol (ULTRAM) 50 MG tablet Take 1 tablet (50 mg total) by mouth every 6 (six) hours as needed for pain. 12/02/11 12/12/11 Yes Simbiso Ranga, MD    Current Facility-Administered Medications  Medication Dose Route Frequency Provider Last Rate Last Dose  . 0.9 %  sodium  chloride infusion   Intravenous STAT Shaaron Adler, [place] 75 mL/hr at 12/04/11 1815    . acetaminophen (TYLENOL) tablet 650 mg  650 mg Oral Q6H PRN Zannie Cove, MD       Or  . acetaminophen (TYLENOL) suppository 650 mg  650 mg Rectal Q6H PRN Zannie Cove, MD      . carvedilol (COREG) tablet 3.125 mg  3.125 mg Oral BID WC Shanker Levora Dredge, MD      . Darunavir Ethanolate (PREZISTA) tablet 800 mg  800 mg Oral Q breakfast Zannie Cove, MD   800 mg at 12/05/11 0745  . emtricitabine-tenofovir (TRUVADA) 200-300 MG per tablet 1 tablet  1 tablet Oral Daily Zannie Cove, MD   1 tablet at 12/05/11 1000  . enoxaparin (LOVENOX) injection 40 mg  40 mg Subcutaneous Q24H Maretta Bees, MD      . etravirine (INTELENCE) tablet 200 mg  200 mg Oral BID WC Zannie Cove, MD   200 mg at 12/05/11 0745  . furosemide (LASIX) injection 40 mg  40 mg Intravenous Daily Shanker Levora Dredge, MD      . HYDROmorphone (DILAUDID) injection 1 mg  1 mg Intravenous Q4H PRN Zannie Cove, MD   1 mg at 12/04/11 2141  . levofloxacin (LEVAQUIN) IVPB 750 mg  750 mg Intravenous Q24H Maryanna Shape [place], [place]   750 mg at 12/05/11 1212  . lisinopril (PRINIVIL,ZESTRIL) tablet 10 mg  10 mg Oral Daily Maretta Bees, MD   10 mg at 12/05/11 1213  . loratadine (CLARITIN) tablet 10 mg  10 mg Oral Daily Zannie Cove, MD   10 mg at 12/05/11 1000  . ondansetron (ZOFRAN) tablet 4 mg  4 mg Oral Q6H PRN Zannie Cove, MD       Or  . ondansetron [facility]) injection 4 mg  4 mg Intravenous Q6H PRN Zannie Cove, MD   4 mg at 12/05/11 0404  . pantoprazole (PROTONIX) injection 40 mg  40 mg Intravenous Q24H Stephani Police, PA      . piperacillin-tazobactam (ZOSYN) IVPB 3.375 g  3.375 g Intravenous Once Drake Leach [place], PHARMD   3.375 g at 12/04/11 1643  . piperacillin-tazobactam (ZOSYN) IVPB 3.375 g  3.375 g Intravenous Q8H Drake Leach [place], PHARMD   3.375 g at 12/05/11 0752  . ritonavir (NORVIR) tablet 100 mg   100 mg Oral BID Zannie Cove, MD   100 mg at 12/05/11 1000  . sodium chloride 0.9 % injection 3 mL  3 mL Intravenous Q12H Zannie Cove, MD   3 mL at 12/04/11 2239  . traMADol (ULTRAM) tablet 50 mg  50 mg Oral Q6H PRN Zannie Cove, MD      . DISCONTD: furosemide (LASIX) injection 40 mg  40 mg Intravenous Q12H Zannie Cove, MD   40 mg at 12/05/11 0644  . DISCONTD: magnesium hydroxide (MILK OF MAGNESIA) suspension 30 mL  30 mL Oral Once Stephani Police, [place]      . DISCONTD:  morphine 4 MG/ML injection 4 mg  4 mg Intravenous Q4H PRN Shaaron Adler, PA-C      . DISCONTD: ondansetron [facility]) injection 4 mg  4 mg Intravenous Q8H PRN Shaaron Adler, PA-C      . DISCONTD: senna-docusate (Senokot-S) tablet 2 tablet  2 tablet Oral QHS Stephani Police, PA        Allergies as of 12/04/2011 - Review Complete 12/04/2011  Allergen Reaction Noted  . Bactrim Rash 11/13/2010    Family History  Problem Relation Age of Onset  . Cancer Mother     History   Social History  . Marital Status: Single    Spouse Name: N/A    Number of Children: N/A  . Years of Education: N/A   Occupational History  . Not on file.   Social History Main Topics  . Smoking status: Never Smoker   . Smokeless tobacco: Never Used  . Alcohol Use: 3.5 oz/week    7 drink(s) per week     socially  . Drug Use: 7 per week     marijuana daily  . Sexually Active: Yes -- Male partner(s)     28 year relationship, declined condoms   Other Topics Concern  . Not on file   Social History Narrative  . No narrative on file    Review of Systems: Pertinent positive and negative review of systems were noted in the above HPI section.  All other review of systems was otherwise negative.   Physical Exam: Vital signs in last 24 hours: Temp:  [98 F (36.7 C)-98.3 F (36.8 C)] 98.3 F (36.8 C) (07/05 2214) Pulse Rate:  [102-111] 109  (07/05 2214) Resp:  [20-22] 20  (07/05 2214) BP: (151-165)/(114-137)  156/114 mmHg (07/05 2214) SpO2:  [91 %-96 %] 91 % (07/05 2214) Weight:  [150 lb (68.04 kg)-153 lb 3.5 oz (69.5 kg)] 153 lb 3.5 oz (69.5 kg) (07/06 0800) Last BM Date: 12/05/11 General:   Alert,  Well-developed, well-nourished, thin pleasan AA malet and cooperative in NAD Head:  Normocephalic and atraumatic. Eyes:  Sclera clear, no icterus.   Conjunctiva pink. Ears:  Normal auditory acuity. Nose:  No deformity, discharge,  or lesions. Mouth:  No deformity or lesions.   Neck:  Supple; no masses or thyromegaly. Lungs:  Clear throughout to auscultation.   Crackles right base Heart:  Regular rate and rhythm; no murmurs, clicks, rubs,  or gallops. Abdomen:  Soft, tender epigastrium and RUQ, no palpable mass or hsm,BS+ Msk:  Symmetrical without gross deformities. . Pulses:  Normal pulses noted. Extremities:  Without clubbing or edema. Neurologic:  Alert and  oriented x4;  grossly normal neurologically. Skin:  Intact without significant lesions or rashes.. Psych:  Alert and cooperative. Normal mood and affect.  Intake/Output from previous day:   Intake/Output this shift: Total I/O In: 1131.3 [I.V.:1031.3; IV Piggyback:100] Out: 1000 [Urine:1000]  Lab Results:  [facility] 12/05/11 0625 12/04/11 1419  WBC 7.3 8.1  HGB 11.8* 12.3*  HCT 35.5* 36.6*  PLT 261 260   BMET  Basename 12/05/11 0625 12/04/11 1419  NA 144 141  K 3.4* 3.5  CL 106 104  CO2 24 20  GLUCOSE 104* 88  BUN 13 10  CREATININE 1.07 0.86  CALCIUM 9.0 9.4   LFT  Basename 12/05/11 0625  PROT 7.0  ALBUMIN 3.4*  AST 82*  ALT 57*  ALKPHOS 89  BILITOT 0.6  BILIDIR --  IBILI --   PT/INR No results found for  this basename: LABPROT:2,INR:2 in the last 72 hours Hepatitis Panel No results found for this basename: HEPBSAG,HCVAB,HEPAIGM,HEPBIGM in the last 72 hours    Studies/Results: Dg Chest 2 View  12/05/2011  *RADIOLOGY REPORT*  Clinical Data: Epigastric abdominal pain.  Old right-sided chest pain.  History  of hepatitis and HIV.  Follow up mild interstitial edema and effusions.  CHEST - 2 VIEW  Comparison: Two-view chest x-ray yesterday, 07/26/2006.  Findings: Cardiac silhouette markedly enlarged but stable.  Hilar and mediastinal contours otherwise unremarkable.  Interval resolution of the mild interstitial pulmonary edema.  Persistent bilateral pleural effusions, right greater than left, and associated consolidation in the right lower lobe and right middle lobe.  No new pulmonary parenchymal abnormalities.  Visualized bony thorax intact.  IMPRESSION:  1.  Resolution of interstitial pulmonary edema. 2.  Persistent small bilateral pleural effusions and consolidation in the right middle and lower lobes, likely reflecting pneumonia.  Original Report Authenticated By: Arnell Sieving, M.D.   Dg Chest 2 View  12/04/2011  **ADDENDUM** CREATED: 12/04/2011 15:34:03  There is a more recent chest radiograph from an acute abdomen series dated 11/30/2011 for comparison.  The currently demonstrated enlarged cardiac silhouette and prominent pulmonary vasculature and interstitial markings were previously present.  The currently demonstrated bilateral pleural fluid and right basilar opacity were not previously present.  **END ADDENDUM** SIGNED BY: Londell Moh. Azucena Kuba, M.D.   12/04/2011  *RADIOLOGY REPORT*  Clinical Data: Chest pain and shortness of breath.  Smoker. Abdominal pain.  Nausea, vomiting and diarrhea.  CHEST - 2 VIEW  Comparison: Previous examinations.  Findings: Enlarged cardiac silhouette, not present on 07/26/2006. Patchy and linear density at the right lung base.  Small bilateral pleural effusions.  Increased prominence of the pulmonary vasculature and interstitial markings.  Unremarkable bones.  IMPRESSION:  1.  Interval cardiomegaly and mild changes of congestive heart failure with bilateral pleural effusions. 2.  Right basilar atelectasis and possible pneumonia.  Original Report Authenticated By: Darrol Angel, M.D.     Dg Abd 2 Views  12/04/2011  *RADIOLOGY REPORT*  Clinical Data: Chest pain.  Shortness of breath.  Abdominal pain. Nausea and vomiting.  Smoker.  History of HIV.  ABDOMEN - 2 VIEW  Comparison: Acute abdomen series 11/30/2011.  MRI abdomen 10/24/2008.  Findings: Bowel gas pattern unremarkable without evidence of obstruction or significant ileus.  No evidence of free air or significant air fluid levels on the erect image.  Phleboliths in the pelvis.  No visible opaque urinary tract calculi.  Mild degenerative changes involving the lumbar spine.  IMPRESSION: No acute abdominal abnormality.  Original Report Authenticated By: Arnell Sieving, M.D.    IMPRESSION:  #30 46 year old male with HIV, normal CD4 count and being maintained on HAART. Patient presenting at this time with one-week history of persistent epigastric pain, nausea and intermittent vomiting. Workup during his last admission a few days ago showed a markedly thickened gallbladder wall on ultrasound and some fluid surrounding the gallbladder but no definite gallstones, HIDA scan was negative. I am still concerned that he has a subacute cholecystitis. Will need to rule out peptic ulcer disease or other gastropathy. #2 pneumonia-right middle lobe and right lower lobe #3 congestive heart failure #4 history of substance abuse  PLAN: Continue protonix twice daily Will repeat upper abdominal ultrasound Consider EGD tomorrow or Monday depending on results of ultrasound.   Amy Esterwood  12/05/2011, 12:17 PM  GI ATTENDING  History, laboratories, x-rays, prior records all reviewed. Patient seen and  examined. Agree with above. Patient presents to the hospital with recurrent upper abdominal pain. Also found to have pneumonia. Undergoing cardiac echo for elevated BNP. Has a history of CHF. Concerns for gallbladder disease earlier in the week. Seen by general surgery. Not felt to be gallbladder after negative HIDA scan. Has been on PPI for a few  days. Nausea and intermittent vomiting in addition to pain. No other GI complaints. Exam is remarkable for mild upper abdominal tenderness. Agree with plans for repeat abdominal ultrasound. If unrevealing, diagnostic upper endoscopy. Will follow. Thank you.  Wilhemina Bonito. Eda Keys., M.D. [facility] Division of Gastroenterology

## 2011-12-05 NOTE — Progress Notes (Signed)
PATIENT DETAILS Name: [name] Age: 46 y.o. Sex: male Date of Birth: [birth year] Admit Date: 12/04/2011 PCP:No primary provider on file.  Subjective: Admitted with Upper abdominal pain-has had difficulty with constipation lately  Objective: Vital signs in last 24 hours: Filed Vitals:   12/04/11 1620 12/04/11 1806 12/04/11 2214 12/05/11 0800  BP: 151/120 165/137 156/114   Pulse: 102 111 109   Temp:  98 F (36.7 C) 98.3 F (36.8 C)   TempSrc:  Oral    Resp: 20 20 20    Height:      Weight:    69.5 kg (153 lb 3.5 oz)  SpO2: 93% 92% 91%     Weight change:   Body mass index is 26.30 kg/(m^2).  Intake/Output from previous day:  Intake/Output Summary (Last 24 hours) at 12/05/11 1012 Last data filed at 12/05/11 0800  Gross per 24 hour  Intake 1131.25 ml  Output   1000 ml  Net 131.25 ml    PHYSICAL EXAM: Gen Exam: Awake and alert with clear speech.   Neck: Supple, No JVD.  Chest: B/L Clear.   CVS: S1 S2 Regular, no murmurs.  Abdomen: soft, BS +, non tender currently, non distended.  Extremities: no edema, lower extremities warm to touch. Neurologic: Non Focal.   Skin: No Rash.  Wounds: N/A.    CONSULTS:  GI-pending  LAB RESULTS: CBC  Lab 12/05/11 0625 12/04/11 1419 12/01/11 0400 11/30/11 1132  WBC 7.3 8.1 9.9 10.2  HGB 11.8* 12.3* 11.9* 13.8  HCT 35.5* 36.6* 35.3* 41.5  PLT 261 260 206 226  MCV 83.7 83.4 85.1 85.0  MCH 27.8 28.0 28.7 28.3  MCHC 33.2 33.6 33.7 33.3  RDW 13.5 13.4 13.2 13.1  LYMPHSABS -- -- -- 1.6  MONOABS -- -- -- 0.5  EOSABS -- -- -- 0.0  BASOSABS -- -- -- 0.0  BANDABS -- -- -- --    Chemistries   Lab 12/05/11 0625 12/04/11 1419 12/01/11 0400 11/30/11 1132  NA 144 141 139 140  K 3.4* 3.5 3.5 3.7  CL 106 104 108 106  CO2 24 20 18* 19  GLUCOSE 104* 88 94 163*  BUN 13 10 12 13   CREATININE 1.07 0.86 1.20 1.15  CALCIUM 9.0 9.4 8.2* 9.5  MG -- -- -- --    CBG: No results found for this basename: GLUCAP:5 in the last 168  hours  GFR Estimated Creatinine Clearance: 70.7 ml/min (by C-G formula based on Cr of 1.07).  Coagulation profile No results found for this basename: INR:5,PROTIME:5 in the last 168 hours  Cardiac Enzymes  Lab 11/30/11 1833 11/30/11 1526  CKMB -- --  TROPONINI <0.30 <0.30  MYOGLOBIN -- --    No components found with this basename: POCBNP:3 No results found for this basename: DDIMER:2 in the last 72 hours No results found for this basename: HGBA1C:2 in the last 72 hours No results found for this basename: CHOL:2,HDL:2,LDLCALC:2,TRIG:2,CHOLHDL:2,LDLDIRECT:2 in the last 72 hours No results found for this basename: TSH,T4TOTAL,FREET3,T3FREE,THYROIDAB in the last 72 hours No results found for this basename: VITAMINB12:2,FOLATE:2,FERRITIN:2,TIBC:2,IRON:2,RETICCTPCT:2 in the last 72 hours  Basename 12/04/11 1419  LIPASE 24  AMYLASE --    Urine Studies No results found for this basename: UACOL:2,UAPR:2,USPG:2,UPH:2,UTP:2,UGL:2,UKET:2,UBIL:2,UHGB:2,UNIT:2,UROB:2,ULEU:2,UEPI:2,UWBC:2,URBC:2,UBAC:2,CAST:2,CRYS:2,UCOM:2,BILUA:2 in the last 72 hours  MICROBIOLOGY: Recent Results (from the past 240 hour(s))  URINE CULTURE     Status: Normal   Collection Time   11/30/11 12:23 PM      Component Value Range Status Comment   Specimen Description  URINE, CLEAN CATCH   Final    Special Requests ADDED 11/30/11 1423   Final    Culture  Setup Time 12/01/2011 00:37   Final    Colony Count NO GROWTH   Final    Culture NO GROWTH   Final    Report Status 12/01/2011 FINAL   Final   MRSA PCR SCREENING     Status: Normal   Collection Time   11/30/11  8:09 PM      Component Value Range Status Comment   MRSA by PCR NEGATIVE  NEGATIVE Final     RADIOLOGY STUDIES/RESULTS: Dg Chest 2 View  12/05/2011  *RADIOLOGY REPORT*  Clinical Data: Epigastric abdominal pain.  Old right-sided chest pain.  History of hepatitis and HIV.  Follow up mild interstitial edema and effusions.  CHEST - 2 VIEW  Comparison: Two-view  chest x-ray yesterday, 07/26/2006.  Findings: Cardiac silhouette markedly enlarged but stable.  Hilar and mediastinal contours otherwise unremarkable.  Interval resolution of the mild interstitial pulmonary edema.  Persistent bilateral pleural effusions, right greater than left, and associated consolidation in the right lower lobe and right middle lobe.  No new pulmonary parenchymal abnormalities.  Visualized bony thorax intact.  IMPRESSION:  1.  Resolution of interstitial pulmonary edema. 2.  Persistent small bilateral pleural effusions and consolidation in the right middle and lower lobes, likely reflecting pneumonia.  Original Report Authenticated By: Arnell Sieving, M.D.   Dg Chest 2 View  12/04/2011  **ADDENDUM** CREATED: 12/04/2011 15:34:03  There is a more recent chest radiograph from an acute abdomen series dated 11/30/2011 for comparison.  The currently demonstrated enlarged cardiac silhouette and prominent pulmonary vasculature and interstitial markings were previously present.  The currently demonstrated bilateral pleural fluid and right basilar opacity were not previously present.  **END ADDENDUM** SIGNED BY: Londell Moh. Azucena Kuba, M.D.   12/04/2011  *RADIOLOGY REPORT*  Clinical Data: Chest pain and shortness of breath.  Smoker. Abdominal pain.  Nausea, vomiting and diarrhea.  CHEST - 2 VIEW  Comparison: Previous examinations.  Findings: Enlarged cardiac silhouette, not present on 07/26/2006. Patchy and linear density at the right lung base.  Small bilateral pleural effusions.  Increased prominence of the pulmonary vasculature and interstitial markings.  Unremarkable bones.  IMPRESSION:  1.  Interval cardiomegaly and mild changes of congestive heart failure with bilateral pleural effusions. 2.  Right basilar atelectasis and possible pneumonia.  Original Report Authenticated By: Darrol Angel, M.D.   Ct Angio Chest W/cm &/or Wo Cm  11/30/2011  *RADIOLOGY REPORT*  Clinical Data:  Chest and abdominal pain.   CT ANGIOGRAPHY CHEST CT ABDOMEN AND PELVIS WITH CONTRAST  Technique:  Multidetector CT imaging of the chest was performed using the standard protocol during bolus administration of intravenous contrast.  Multiplanar CT image reconstructions including MIPs were obtained to evaluate the vascular anatomy. Multidetector CT imaging of the abdomen and pelvis was performed using the standard protocol during bolus administration of intravenous contrast.  Contrast: OMNIPAQUE IOHEXOL 350 MG/ML SOLN  Comparison:   None.  CTA CHEST  Findings:  The chest wall is unremarkable.  No supraclavicular or axillary lymphadenopathy.  The thyroid gland appears normal.  The bony thorax is intact.  No destructive bony lesions or spinal canal compromise.  The heart is borderline enlarged.  No pericardial effusion.  There are mildly enlarged mediastinal lymph nodes which require follow- up.  The largest right pretracheal node measures 17 mm and there are 216 mm aorticopulmonary window nodes.  The  esophagus is grossly normal.  The aorta is normal in caliber.  The pulmonary arterial tree is well opacified.  No filling defects to suggest pulmonary emboli.  Examination of the lung parenchyma demonstrates early emphysematous changes with apical bulla.  There is dependent atelectasis but no infiltrates, edema or effusions.  No worrisome nodules or masses.  The upper abdomen is grossly normal.   Review of the MIP images confirms the above findings.  IMPRESSION:  1.  No CT evidence of pulmonary emboli. 2.  Normal thoracic aorta. 3.  Mild cardiac enlargement. 4.  Mildly enlarged mediastinal lymph nodes.  I would suggest a follow-up chest CT in 4 months to reevaluate.  CT ABDOMEN AND PELVIS  Findings: The liver is unremarkable.  No focal lesions or intrahepatic biliary dilatation. No obvious gallstones.  Possible mild gallbladder wall thickening but no pericholecystic inflammatory changes.  Ultrasound may be helpful for further evaluation.  No  common bile duct dilatation.  Some reflux of contrast is noted down the IVC and into the hepatic veins.  This could be due to tricuspid regurgitation.  The spleen is normal in size.  No focal lesions.  The pancreas is normal.  The adrenal glands and kidneys are normal except for left renal cyst.  The stomach, duodenum, small bowel and colon are unremarkable.  No inflammatory changes or mass lesions.  The appendix is normal.  The aorta is normal in caliber.  The major branch vessels are normal. No mesenteric or retroperitoneal masses are adenopathy.  The bladder, prostate gland and seminal vesicles are unremarkable. No pelvic mass, adenopathy or free pelvic fluid collections.  No inguinal mass or hernia.  The bony structures are unremarkable.  Review of the MIP images confirms the above findings.  IMPRESSION:  1.  Mild gallbladder wall thickening is suspected.  No obvious gallstones or pericholecystic inflammatory change but ultrasound may be helpful for further evaluation if the patient has any right upper quadrant pain. 2.  No other significant abdominal/pelvic findings.  Original Report Authenticated By: P. Loralie Champagne, M.D.   Nm Hepatobiliary  12/01/2011  *RADIOLOGY REPORT*  Clinical Data: Abdominal pain, possible acute cholecystitis  NUCLEAR MEDICINE HEPATOHBILIARY INCLUDE GB  Radiopharmaceutical:  5 mCi Tc-62m mebrofenin  Comparison: None Correlation:  Ultrasound abdomen 11/30/2011, CT abdomen 11/30/2011  Findings: Mild delay in clearance of tracer from bloodstream suggesting mild hepatocellular dysfunction. Prompt excretion of tracer by the liver into the biliary system. Gallbladder is visualized at 10 minutes. Small bowel is visualized at 17 minutes. No evidence of common bile duct or cystic duct obstruction.  IMPRESSION: Patent biliary tree without evidence of acalculous cholecystitis. Somewhat prolonged clearance of tracer from bloodstream suggesting a mild degree of hepatocellular dysfunction.   Original Report Authenticated By: Lollie Marrow, M.D.   US Abdomen Complete  11/30/2011  *RADIOLOGY REPORT*  Clinical Data:  Nausea and vomiting.  COMPLETE ABDOMINAL ULTRASOUND  Comparison:  CT scan, same date.  Findings:  Gallbladder:  No definite gallstones but there is marked gallbladder wall thickening and a small amount of pericholecystic fluid which may suggest acalculus cholecystitis.  Common bile duct:  Normal in caliber measuring a maximum of 3.61mm.  Liver:  The liver is sonographically unremarkable.  There is normal echogenicity without focal lesions or intrahepatic biliary dilatation.  IVC:  Normal caliber.  Pancreas:  Limited visualization by ultrasound but appeared normal on the CT scan.  Spleen:  Normal size and echogenicity without focal lesions.  Right Kidney:  10.1 cm in length.  Normal renal cortical thickness and echogenicity without focal lesions or hydronephrosis.  Left Kidney:  12.4 cm in length. Normal renal cortical thickness and echogenicity without focal lesions or hydronephrosis.4.7 x 3.3 x 4.0 cm simple appearing lower pole cyst.  Abdominal aorta:  Normal caliber.  IMPRESSION:  1.  Diffuse and fairly marked gallbladder wall thickening without definite gallstones.  Findings could suggest acalculous cholecystitis. 2.  Normal caliber common bile duct. 3.  No other significant abdominal findings.  Original Report Authenticated By: P. Loralie Champagne, M.D.   Ct Abdomen Pelvis W Contrast  11/30/2011  *RADIOLOGY REPORT*  Clinical Data:  Chest and abdominal pain.  CT ANGIOGRAPHY CHEST CT ABDOMEN AND PELVIS WITH CONTRAST  Technique:  Multidetector CT imaging of the chest was performed using the standard protocol during bolus administration of intravenous contrast.  Multiplanar CT image reconstructions including MIPs were obtained to evaluate the vascular anatomy. Multidetector CT imaging of the abdomen and pelvis was performed using the standard protocol during bolus administration of  intravenous contrast.  Contrast: OMNIPAQUE IOHEXOL 350 MG/ML SOLN  Comparison:   None.  CTA CHEST  Findings:  The chest wall is unremarkable.  No supraclavicular or axillary lymphadenopathy.  The thyroid gland appears normal.  The bony thorax is intact.  No destructive bony lesions or spinal canal compromise.  The heart is borderline enlarged.  No pericardial effusion.  There are mildly enlarged mediastinal lymph nodes which require follow- up.  The largest right pretracheal node measures 17 mm and there are 216 mm aorticopulmonary window nodes.  The esophagus is grossly normal.  The aorta is normal in caliber.  The pulmonary arterial tree is well opacified.  No filling defects to suggest pulmonary emboli.  Examination of the lung parenchyma demonstrates early emphysematous changes with apical bulla.  There is dependent atelectasis but no infiltrates, edema or effusions.  No worrisome nodules or masses.  The upper abdomen is grossly normal.   Review of the MIP images confirms the above findings.  IMPRESSION:  1.  No CT evidence of pulmonary emboli. 2.  Normal thoracic aorta. 3.  Mild cardiac enlargement. 4.  Mildly enlarged mediastinal lymph nodes.  I would suggest a follow-up chest CT in 4 months to reevaluate.  CT ABDOMEN AND PELVIS  Findings: The liver is unremarkable.  No focal lesions or intrahepatic biliary dilatation. No obvious gallstones.  Possible mild gallbladder wall thickening but no pericholecystic inflammatory changes.  Ultrasound may be helpful for further evaluation.  No common bile duct dilatation.  Some reflux of contrast is noted down the IVC and into the hepatic veins.  This could be due to tricuspid regurgitation.  The spleen is normal in size.  No focal lesions.  The pancreas is normal.  The adrenal glands and kidneys are normal except for left renal cyst.  The stomach, duodenum, small bowel and colon are unremarkable.  No inflammatory changes or mass lesions.  The appendix is normal.   The aorta is normal in caliber.  The major branch vessels are normal. No mesenteric or retroperitoneal masses are adenopathy.  The bladder, prostate gland and seminal vesicles are unremarkable. No pelvic mass, adenopathy or free pelvic fluid collections.  No inguinal mass or hernia.  The bony structures are unremarkable.  Review of the MIP images confirms the above findings.  IMPRESSION:  1.  Mild gallbladder wall thickening is suspected.  No obvious gallstones or pericholecystic inflammatory change but ultrasound may be helpful for further evaluation if the patient has any  right upper quadrant pain. 2.  No other significant abdominal/pelvic findings.  Original Report Authenticated By: P. Loralie Champagne, M.D.   Dg Abd 2 Views  12/04/2011  *RADIOLOGY REPORT*  Clinical Data: Chest pain.  Shortness of breath.  Abdominal pain. Nausea and vomiting.  Smoker.  History of HIV.  ABDOMEN - 2 VIEW  Comparison: Acute abdomen series 11/30/2011.  MRI abdomen 10/24/2008.  Findings: Bowel gas pattern unremarkable without evidence of obstruction or significant ileus.  No evidence of free air or significant air fluid levels on the erect image.  Phleboliths in the pelvis.  No visible opaque urinary tract calculi.  Mild degenerative changes involving the lumbar spine.  IMPRESSION: No acute abdominal abnormality.  Original Report Authenticated By: Arnell Sieving, M.D.   Dg Abd Acute W/chest  11/30/2011  *RADIOLOGY REPORT*  Clinical Data: Shortness of breath.  Abdomen and back pain. Constipation.  ACUTE ABDOMEN SERIES (ABDOMEN 2 VIEW & CHEST 1 VIEW)  Comparison: Chest dated 07/26/2006.  Findings: Interval enlargement of the cardiac silhouette.  Interval small amount of linear density in both mid and lower lung zones with interval minimal prominence of the interstitial markings.  No pleural fluid.  Normal bowel gas pattern without free peritoneal air.  Normal amount of stool.  Minimal scoliosis.  IMPRESSION:  1.  Interval  cardiomegaly, pulmonary vascular congestion and mild interstitial pulmonary edema/chronic interstitial lung disease. 2.  Small amount of linear scarring or atelectasis bilaterally. 3.  No acute abdominal abnormality.  Original Report Authenticated By: Darrol Angel, M.D.    MEDICATIONS: Scheduled Meds:   . sodium chloride   Intravenous STAT  . darunavir  800 mg Oral Q breakfast  . emtricitabine-tenofovir  1 tablet Oral Daily  . etravirine  200 mg Oral BID WC  . furosemide  40 mg Intravenous Q12H  . loratadine  10 mg Oral Daily  . pantoprazole (PROTONIX) IV  40 mg Intravenous Q24H  . piperacillin-tazobactam  3.375 g Intravenous Once  . piperacillin-tazobactam (ZOSYN)  IV  3.375 g Intravenous Q8H  . ritonavir  100 mg Oral BID  . sodium chloride  3 mL Intravenous Q12H  . DISCONTD: magnesium hydroxide  30 mL Oral Once  . DISCONTD: senna-docusate  2 tablet Oral QHS   Continuous Infusions:  PRN Meds:.acetaminophen, acetaminophen, HYDROmorphone (DILAUDID) injection, morphine injection, ondansetron (ZOFRAN) IV, ondansetron, traMADol, DISCONTD: ondansetron (ZOFRAN) IV  Antibiotics: Anti-infectives     Start     Dose/Rate Route Frequency Ordered Stop   12/05/11 0800   Darunavir Ethanolate (PREZISTA) tablet 800 mg        800 mg Oral Daily with breakfast 12/04/11 1805     12/05/11 0100  piperacillin-tazobactam (ZOSYN) IVPB 3.375 g       3.375 g 12.5 mL/hr over 240 Minutes Intravenous Every 8 hours 12/04/11 1805     12/04/11 2200   ritonavir (NORVIR) tablet 100 mg        100 mg Oral 2 times daily 12/04/11 1805     12/04/11 1930   emtricitabine-tenofovir (TRUVADA) 200-300 MG per tablet 1 tablet        1 tablet Oral Daily 12/04/11 1805     12/04/11 1930   etravirine (INTELENCE) tablet 200 mg        200 mg Oral 2 times daily with meals 12/04/11 1805     12/04/11 1630  piperacillin-tazobactam (ZOSYN) IVPB 3.375 g       3.375 g 100 mL/hr over 30 Minutes Intravenous  Once 12/04/11 1623  12/04/11 1713          Assessment/Plan: Active Problems: Abdominal Pain -unclear etiology -just discharged on 7/3 after extensive evaluation-including CT abd/chest, Ultrasound Abd and HIDA scan. Also had a [facility] consult as well. CT abdomen and Ultrasound-did show-thickened gall bladder with stones-however HIDA scan was negative for acalculous cholecystitis -may need a EGD-await GI eval -having constipation-will try bowel regimen with enema to see if having BM will help -c/w PPI  PNA -seen better on CXR done today -continue with Zosyn -afebrile and No leukocytosis  CHF with exacerbation -not sure whether systolic or diastolic -no rales on exam -change Lasix to daily dosing, add low dose Coreg -await 2 D Echo -strict I&O -Daily weights   HIV (human immunodeficiency virus infection) -c/w ART's -CD4 on 09/01/11-500 -outpatient ID follow up  HTN -resume ACEI, adding Low dose Coreg -stop HCTZ-as on Lasix  Pleural Effusion -likely transudative from CHF -monitor  GERD -PPI  Disposition: -remain inpatient  DVT Prophylaxis: -begin prophylactic Loveox  Code Status: Full Code  Jeoffrey Massed, MD  [facility] Pager [phone]  If 7PM-7AM, please contact night-coverage [email or web address] Password [email or web address] 12/05/2011, 10:12 AM   LOS: 1 day

## 2011-12-05 NOTE — Progress Notes (Signed)
ANTIBIOTIC CONSULT NOTE - INITIAL  Pharmacy Consult for Levaquin Indication: pneumonia  Allergies  Allergen Reactions  . Bactrim Rash    Patient Measurements: Height: 5\' 4"  (162.6 cm) Weight: 153 lb 3.5 oz (69.5 kg) IBW/kg (Calculated) : 59.2   Vital Signs:   Intake/Output from previous day:   Intake/Output from this shift: Total I/O In: 1131.3 [I.V.:1031.3; IV Piggyback:100] Out: 1000 [Urine:1000]  Labs:  [facility] 12/05/11 0625 12/04/11 1419  WBC 7.3 8.1  HGB 11.8* 12.3*  PLT 261 260  LABCREA -- --  CREATININE 1.07 0.86   Estimated Creatinine Clearance: 70.7 ml/min (by C-G formula based on Cr of 1.07). No results found for this basename: VANCOTROUGH:2,VANCOPEAK:2,VANCORANDOM:2,GENTTROUGH:2,GENTPEAK:2,GENTRANDOM:2,TOBRATROUGH:2,TOBRAPEAK:2,TOBRARND:2,AMIKACINPEAK:2,AMIKACINTROU:2,AMIKACIN:2, in the last 72 hours   Microbiology: Recent Results (from the past 720 hour(s))  URINE CULTURE     Status: Normal   Collection Time   11/30/11 12:23 PM      Component Value Range Status Comment   Specimen Description URINE, CLEAN CATCH   Final    Special Requests ADDED 11/30/11 1423   Final    Culture  Setup Time 12/01/2011 00:37   Final    Colony Count NO GROWTH   Final    Culture NO GROWTH   Final    Report Status 12/01/2011 FINAL   Final   MRSA PCR SCREENING     Status: Normal   Collection Time   11/30/11  8:09 PM      Component Value Range Status Comment   MRSA by PCR NEGATIVE  NEGATIVE Final     Medical History: Past Medical History  Diagnosis Date  . Hypertension   . HIV infection   . Hepatitis    Assessment: 46 yo male with history of HIV who presented to the ED with shortness of breath, constipation and weakness. He had a recent admission for SIRS and abdominal pain but HIDA scan was normal. He was discharged on 7/3.   Patient was initially started on Zosyn for HCAP. Pharmacy now consulted to begin Levaquin to broaden coverage. Spoke with Dr. Jerral Ralph and he does  not want vancomycin coverage a this time.  Plan:  1. Levaquin 750 mg IV q24h 2. Follow up clinical progress and LOT  [facility], [address].D., BCPS Clinical Pharmacist Pager: [phone] 12/05/2011 11:04 AM

## 2011-12-05 NOTE — ED Provider Notes (Signed)
Medical screening examination/treatment/procedure(s) were performed by non-physician practitioner and as supervising physician I was immediately available for consultation/collaboration.  Ethelda Chick, MD 12/05/11 [phone]

## 2011-12-05 NOTE — Progress Notes (Signed)
  Echocardiogram 2D Echocardiogram has been performed.  [name] 12/05/2011, 3:51 PM

## 2011-12-06 ENCOUNTER — Inpatient Hospital Stay (HOSPITAL_COMMUNITY): Payer: Medicare Other

## 2011-12-06 ENCOUNTER — Encounter (HOSPITAL_COMMUNITY): Payer: Self-pay | Admitting: Cardiology

## 2011-12-06 DIAGNOSIS — I428 Other cardiomyopathies: Secondary | ICD-10-CM

## 2011-12-06 DIAGNOSIS — R109 Unspecified abdominal pain: Secondary | ICD-10-CM | POA: Diagnosis not present

## 2011-12-06 DIAGNOSIS — J189 Pneumonia, unspecified organism: Secondary | ICD-10-CM | POA: Diagnosis not present

## 2011-12-06 DIAGNOSIS — Z21 Asymptomatic human immunodeficiency virus [HIV] infection status: Secondary | ICD-10-CM | POA: Diagnosis not present

## 2011-12-06 DIAGNOSIS — I509 Heart failure, unspecified: Secondary | ICD-10-CM | POA: Diagnosis not present

## 2011-12-06 DIAGNOSIS — R932 Abnormal findings on diagnostic imaging of liver and biliary tract: Secondary | ICD-10-CM | POA: Diagnosis not present

## 2011-12-06 LAB — CBC
MCH: 28.3 pg (ref 26.0–34.0)
MCHC: 33.8 g/dL (ref 30.0–36.0)
MCV: 83.8 fL (ref 78.0–100.0)
Platelets: 293 10*3/uL (ref 150–400)
RBC: 4.74 MIL/uL (ref 4.22–5.81)
RDW: 13.4 % (ref 11.5–15.5)

## 2011-12-06 LAB — BASIC METABOLIC PANEL
CO2: 25 mEq/L (ref 19–32)
Calcium: 9.4 mg/dL (ref 8.4–10.5)
Creatinine, Ser: 1.38 mg/dL — ABNORMAL HIGH (ref 0.50–1.35)
GFR calc non Af Amer: 59 mL/min — ABNORMAL LOW (ref >90)
Sodium: 142 mEq/L (ref 135–145)

## 2011-12-06 LAB — HEPATIC FUNCTION PANEL
ALT: 73 U/L — ABNORMAL HIGH (ref 0–53)
Bilirubin, Direct: 0.3 mg/dL (ref 0.0–0.3)
Indirect Bilirubin: 0.3 mg/dL (ref 0.3–0.9)
Total Protein: 7.4 g/dL (ref 6.0–8.3)

## 2011-12-06 LAB — PRO B NATRIURETIC PEPTIDE: Pro B Natriuretic peptide (BNP): 11769 pg/mL — ABNORMAL HIGH (ref 0–125)

## 2011-12-06 MED ORDER — FLEET ENEMA 7-19 GM/118ML RE ENEM
1.0000 | ENEMA | Freq: Once | RECTAL | Status: AC
  Administered 2011-12-06: 1 via RECTAL
  Filled 2011-12-06: qty 1

## 2011-12-06 MED ORDER — FUROSEMIDE 40 MG PO TABS
40.0000 mg | ORAL_TABLET | Freq: Every day | ORAL | Status: DC
  Administered 2011-12-06: 40 mg via ORAL
  Filled 2011-12-06 (×2): qty 1

## 2011-12-06 MED ORDER — POTASSIUM CHLORIDE CRYS ER 20 MEQ PO TBCR
40.0000 meq | EXTENDED_RELEASE_TABLET | Freq: Once | ORAL | Status: AC
  Administered 2011-12-06: 40 meq via ORAL
  Filled 2011-12-06: qty 2

## 2011-12-06 MED ORDER — SENNA 8.6 MG PO TABS
2.0000 | ORAL_TABLET | Freq: Every day | ORAL | Status: DC
  Administered 2011-12-06 – 2011-12-08 (×3): 17.2 mg via ORAL
  Filled 2011-12-06 (×4): qty 2

## 2011-12-06 MED ORDER — CARVEDILOL 6.25 MG PO TABS
6.2500 mg | ORAL_TABLET | Freq: Two times a day (BID) | ORAL | Status: DC
  Administered 2011-12-06 – 2011-12-09 (×6): 6.25 mg via ORAL
  Filled 2011-12-06 (×8): qty 1

## 2011-12-06 MED ORDER — POLYETHYLENE GLYCOL 3350 17 G PO PACK
17.0000 g | PACK | Freq: Two times a day (BID) | ORAL | Status: DC
  Administered 2011-12-06 – 2011-12-08 (×3): 17 g via ORAL
  Filled 2011-12-06 (×8): qty 1

## 2011-12-06 NOTE — Consult Note (Signed)
CARDIOLOGY CONSULT NOTE  Patient ID: [name] MRN: [id number] DOB/AGE: 07-20-1965 46 y.o.  Admit date: 12/04/2011 Primary Cardiologist None Chief Complaint  Cardiomyopathy  HPI:  The patient has no prior cardiac history. He does have a history of HIV although has an undetectable viral load. He was admitted to the hospital on 7/5 with epigastric and right upper quadrant discomfort. The etiology of this thus far has been unclear. He was noted on admission to be tachycardic and hypoxemic with sats in the 80s. He is being treated for pneumonia.  He was thought clinically to have congestive heart failure and was found to have a BNP 16,432.  Echo demonstrated a  cardiomyopathy with an EF of 25% with global hypokinesis and moderate MR.    The patient reports no prior cardiac history. He says that prior to coming here this week he was feeling well.  He's not particularly active though he vacuums his home. With this level of activity he was not or breath. He's not had any chest pressure, neck or arm discomfort. He denies PND or orthopnea. He had no weight gain or edema. He's never noticed palpitations, presyncope or syncope. He says that he has been slightly dyspneic here in the hospital. He's had a minimal cough. He does have some sharp fleeting anterior chest discomfort here in the hospital. He has an abnormal EKG as described below.  There were no old EKGs for comparison. Of note the patient does drink multiple beers daily and occasional liquor. Of note he has a history of HTN and his BP was elevated on admission.  Previous records suggest poor control.  Past Medical History  Diagnosis Date  . Hypertension   . HIV infection   . Hepatitis     Hep B and Hep C (patient does not report this but these are listed in previous notes.)  . G6PD deficiency     Past Surgical History  Procedure Date  . Finger surgery     Thumb laceration.      Allergies  Allergen Reactions  . Bactrim Rash    Prescriptions prior to admission  Medication Sig Dispense Refill  . darunavir (PREZISTA) 400 MG tablet Take 800 mg by mouth daily with breakfast.      . emtricitabine-tenofovir (TRUVADA) 200-300 MG per tablet Take 1 tablet by mouth daily.      Marland Kitchen etravirine (INTELENCE) 100 MG tablet Take 200 mg by mouth 2 (two) times daily with a meal.      . hydrochlorothiazide (HYDRODIURIL) 25 MG tablet Take 25 mg by mouth daily.      Marland Kitchen lisinopril (PRINIVIL,ZESTRIL) 40 MG tablet Take 1 tablet (40 mg total) by mouth daily.  30 tablet  3  . loratadine (CLARITIN) 10 MG tablet Take 10 mg by mouth daily.        . naftifine (NAFTIN) 1 % cream Apply 1 application topically 2 (two) times daily. For 6 weeks.  Started 10/05/2011      . omeprazole (PRILOSEC) 20 MG capsule Take 1 capsule (20 mg total) by mouth 2 (two) times daily.  30 capsule  1  . ritonavir (NORVIR) 100 MG capsule Take 100 mg by mouth 2 (two) times daily.      . traMADol (ULTRAM) 50 MG tablet Take 1 tablet (50 mg total) by mouth every 6 (six) hours as needed for pain.  15 tablet  0   Family History  Problem Relation Age of Onset  . Cancer Mother  History   Social History  . Marital Status: Single    Spouse Name: N/A    Number of Children: N/A  . Years of Education: N/A   Occupational History  . Not on file.   Social History Main Topics  . Smoking status: Never Smoker   . Smokeless tobacco: Never Used  . Alcohol Use: 3.5 oz/week    7 drink(s) per week     socially  . Drug Use: 7 per week     marijuana daily  . Sexually Active: Yes -- Male partner(s)     28 year relationship, declined condoms   Other Topics Concern  . Not on file   Social History Narrative   Lives alone.  Drinks beer daily.  Liquor rarely.  He occasionally smokes marijuana..     ROS:  As stated in the HPI and negative for all other systems.  Physical Exam: Blood pressure 149/108, pulse 94, temperature 98.1 F (36.7 C), temperature source Oral, resp. rate  18, height 5\' 4"  (1.626 m), weight 162 lb 11.2 oz (73.8 kg), SpO2 95.00%.  GENERAL:  Well appearing HEENT:  Pupils equal round and reactive, fundi not visualized, oral mucosa unremarkable NECK:  Jugular venous distention 10 cm with HJR, waveform within normal limits, carotid upstroke brisk and symmetric, no bruits, no thyromegaly LYMPHATICS:  No cervical, inguinal adenopathy LUNGS:  Clear to auscultation bilaterally BACK:  No CVA tenderness CHEST:  Unremarkable HEART:  PMI displaced laterally,S1 and S2 within normal limits, positive S3, no S4, no clicks, no rubs, no murmurs ABD:  Flat, positive bowel sounds normal in frequency in pitch, no bruits, no rebound, no guarding, no midline pulsatile mass, no hepatomegaly, no splenomegaly EXT:  2 plus pulses throughout, no edema, no cyanosis no clubbing SKIN:  No rashes no nodules NEURO:  Cranial nerves II through XII grossly intact, motor grossly intact throughout PSYCH:  Cognitively intact, oriented to person place and time   Labs: Lab Results  Component Value Date   BUN 17 12/06/2011   Lab Results  Component Value Date   CREATININE 1.38* 12/06/2011   Lab Results  Component Value Date   NA 142 12/06/2011   K 3.0* 12/06/2011   CL 102 12/06/2011   CO2 25 12/06/2011   Lab Results  Component Value Date   TROPONINI <0.30 11/30/2011   Lab Results  Component Value Date   WBC 8.1 12/06/2011   HGB 13.4 12/06/2011   HCT 39.7 12/06/2011   MCV 83.8 12/06/2011   PLT 293 12/06/2011    Lab Results  Component Value Date   ALT 73* 12/06/2011   AST 86* 12/06/2011   ALKPHOS 88 12/06/2011   BILITOT 0.6 12/06/2011    Echo:   - Left ventricle: The cavity size was mildly dilated. Systolic function was severely reduced. The estimated ejection fraction was in the range of 20% to 25%. Diffuse severe hypokinesis with mild sparing of the inferolateral wall. Asynchronous contraction. - Aortic valve: Mild regurgitation. - Mitral valve: Moderate regurgitation directed  posteriorly. - Left atrium: The atrium was mildly dilated. - Right ventricle: The cavity size was mildly dilated. Systolic function was mildly reduced. - Right atrium: The atrium was mildly dilated. - Pulmonary arteries: Systolic pressure was mildly increased.   Radiology:  CXR:  1. Resolution of interstitial pulmonary edema.  2. Persistent small bilateral pleural effusions and consolidation  in the right middle and lower lobes, likely reflecting pneumonia.  EKG:   NSR rate 105, right and left  atrial enlargement.  IVCD.  12/05/11  ASSESSMENT AND PLAN:   Cardiomyopathy:  I suspect this is related to poorly controlled hypertension alcohol use were both. I do not strongly suspect obstructive coronary disease. If we can get his heart rate down noninvasive coronary angiography with CT would be an excellent imaging modality. I agree with the carvedilol and this can be titrated over the next day or so. We could continue to titrate this and plan imaging as an outpatient. He would need a heart rate to the lower than present for adequate imaging. He will continue on the current dose of lisinopril. Further meds will be titrated as an outpatient. I agree with current oral diuretics. He'll need education and I began to discuss this with him  EtOH abuse:  I discussed complete abstinence. He says he'll think about this but doesn't know if he can do that.  HTN:  This is being managed in the context of treating his CHF.     SignedRollene Rotunda 12/06/2011, 1:53 PM

## 2011-12-06 NOTE — Progress Notes (Signed)
PATIENT DETAILS Name: [name] Age: 46 y.o. Sex: male Date of Birth: [birth year] Admit Date: 12/04/2011 PCP:No primary provider on file.  Subjective: still no BM, occasional abd pain with vomiting yesterday Was never told in the past that he had CHF Did cocaine till 2 years ago, much heavier ETOH use in the past-now drinks only 3 bottles of beer daily  Objective: Vital signs in last 24 hours: Filed Vitals:   12/05/11 0800 12/05/11 1300 12/05/11 2129 12/06/11 0553  BP:  155/116 150/97 149/108  Pulse:  109 83 94  Temp:  98.3 F (36.8 C) 98.2 F (36.8 C) 98.1 F (36.7 C)  TempSrc:  Oral Oral Oral  Resp:  18 18 18   Height:      Weight: 69.5 kg (153 lb 3.5 oz)   73.8 kg (162 lb 11.2 oz)  SpO2:  95% 92% 95%    Weight change: 1.461 kg (3 lb 3.5 oz)  Body mass index is 27.93 kg/(m^2).  Intake/Output from previous day:  Intake/Output Summary (Last 24 hours) at 12/06/11 0903 Last data filed at 12/06/11 0759  Gross per 24 hour  Intake 1333.75 ml  Output   1000 ml  Net 333.75 ml    PHYSICAL EXAM: Gen Exam: Awake and alert with clear speech.   Neck: Supple, No JVD.  Chest: B/L Clear.  No rales CVS: S1 S2 Regular, no murmurs.  Abdomen: soft, BS +, non tender currently, non distended.  Extremities: no edema, lower extremities warm to touch. Neurologic: Non Focal.   Skin: No Rash.  Wounds: N/A.    CONSULTS:  GI  LAB RESULTS: CBC  Lab 12/06/11 0650 12/05/11 0625 12/04/11 1419 12/01/11 0400 11/30/11 1132  WBC 8.1 7.3 8.1 9.9 10.2  HGB 13.4 11.8* 12.3* 11.9* 13.8  HCT 39.7 35.5* 36.6* 35.3* 41.5  PLT 293 261 260 206 226  MCV 83.8 83.7 83.4 85.1 85.0  MCH 28.3 27.8 28.0 28.7 28.3  MCHC 33.8 33.2 33.6 33.7 33.3  RDW 13.4 13.5 13.4 13.2 13.1  LYMPHSABS -- -- -- -- 1.6  MONOABS -- -- -- -- 0.5  EOSABS -- -- -- -- 0.0  BASOSABS -- -- -- -- 0.0  BANDABS -- -- -- -- --    Chemistries   Lab 12/06/11 0650 12/05/11 0625 12/04/11 1419 12/01/11 0400 11/30/11 1132    NA 142 144 141 139 140  K 3.0* 3.4* 3.5 3.5 3.7  CL 102 106 104 108 106  CO2 25 24 20  18* 19  GLUCOSE 90 104* 88 94 163*  BUN 17 13 10 12 13   CREATININE 1.38* 1.07 0.86 1.20 1.15  CALCIUM 9.4 9.0 9.4 8.2* 9.5  MG -- -- -- -- --    CBG: No results found for this basename: GLUCAP:5 in the last 168 hours  GFR Estimated Creatinine Clearance: 60.2 ml/min (by C-G formula based on Cr of 1.38).  Coagulation profile No results found for this basename: INR:5,PROTIME:5 in the last 168 hours  Cardiac Enzymes  Lab 11/30/11 1833 11/30/11 1526  CKMB -- --  TROPONINI <0.30 <0.30  MYOGLOBIN -- --    No components found with this basename: POCBNP:3 No results found for this basename: DDIMER:2 in the last 72 hours No results found for this basename: HGBA1C:2 in the last 72 hours No results found for this basename: CHOL:2,HDL:2,LDLCALC:2,TRIG:2,CHOLHDL:2,LDLDIRECT:2 in the last 72 hours No results found for this basename: TSH,T4TOTAL,FREET3,T3FREE,THYROIDAB in the last 72 hours No results found for this basename: VITAMINB12:2,FOLATE:2,FERRITIN:2,TIBC:2,IRON:2,RETICCTPCT:2 in the last  72 hours  Basename 12/04/11 1419  LIPASE 24  AMYLASE --    Urine Studies No results found for this basename: UACOL:2,UAPR:2,USPG:2,UPH:2,UTP:2,UGL:2,UKET:2,UBIL:2,UHGB:2,UNIT:2,UROB:2,ULEU:2,UEPI:2,UWBC:2,URBC:2,UBAC:2,CAST:2,CRYS:2,UCOM:2,BILUA:2 in the last 72 hours  MICROBIOLOGY: Recent Results (from the past 240 hour(s))  URINE CULTURE     Status: Normal   Collection Time   11/30/11 12:23 PM      Component Value Range Status Comment   Specimen Description URINE, CLEAN CATCH   Final    Special Requests ADDED 11/30/11 1423   Final    Culture  Setup Time 12/01/2011 00:37   Final    Colony Count NO GROWTH   Final    Culture NO GROWTH   Final    Report Status 12/01/2011 FINAL   Final   MRSA PCR SCREENING     Status: Normal   Collection Time   11/30/11  8:09 PM      Component Value Range Status Comment    MRSA by PCR NEGATIVE  NEGATIVE Final     RADIOLOGY STUDIES/RESULTS: Dg Chest 2 View  12/05/2011  *RADIOLOGY REPORT*  Clinical Data: Epigastric abdominal pain.  Old right-sided chest pain.  History of hepatitis and HIV.  Follow up mild interstitial edema and effusions.  CHEST - 2 VIEW  Comparison: Two-view chest x-ray yesterday, 07/26/2006.  Findings: Cardiac silhouette markedly enlarged but stable.  Hilar and mediastinal contours otherwise unremarkable.  Interval resolution of the mild interstitial pulmonary edema.  Persistent bilateral pleural effusions, right greater than left, and associated consolidation in the right lower lobe and right middle lobe.  No new pulmonary parenchymal abnormalities.  Visualized bony thorax intact.  IMPRESSION:  1.  Resolution of interstitial pulmonary edema. 2.  Persistent small bilateral pleural effusions and consolidation in the right middle and lower lobes, likely reflecting pneumonia.  Original Report Authenticated By: Arnell Sieving, M.D.   Dg Chest 2 View  12/04/2011  **ADDENDUM** CREATED: 12/04/2011 15:34:03  There is a more recent chest radiograph from an acute abdomen series dated 11/30/2011 for comparison.  The currently demonstrated enlarged cardiac silhouette and prominent pulmonary vasculature and interstitial markings were previously present.  The currently demonstrated bilateral pleural fluid and right basilar opacity were not previously present.  **END ADDENDUM** SIGNED BY: Londell Moh. Azucena Kuba, M.D.   12/04/2011  *RADIOLOGY REPORT*  Clinical Data: Chest pain and shortness of breath.  Smoker. Abdominal pain.  Nausea, vomiting and diarrhea.  CHEST - 2 VIEW  Comparison: Previous examinations.  Findings: Enlarged cardiac silhouette, not present on 07/26/2006. Patchy and linear density at the right lung base.  Small bilateral pleural effusions.  Increased prominence of the pulmonary vasculature and interstitial markings.  Unremarkable bones.  IMPRESSION:  1.  Interval  cardiomegaly and mild changes of congestive heart failure with bilateral pleural effusions. 2.  Right basilar atelectasis and possible pneumonia.  Original Report Authenticated By: Darrol Angel, M.D.   Ct Angio Chest W/cm &/or Wo Cm  11/30/2011  *RADIOLOGY REPORT*  Clinical Data:  Chest and abdominal pain.  CT ANGIOGRAPHY CHEST CT ABDOMEN AND PELVIS WITH CONTRAST  Technique:  Multidetector CT imaging of the chest was performed using the standard protocol during bolus administration of intravenous contrast.  Multiplanar CT image reconstructions including MIPs were obtained to evaluate the vascular anatomy. Multidetector CT imaging of the abdomen and pelvis was performed using the standard protocol during bolus administration of intravenous contrast.  Contrast: OMNIPAQUE IOHEXOL 350 MG/ML SOLN  Comparison:   None.  CTA CHEST  Findings:  The chest wall  is unremarkable.  No supraclavicular or axillary lymphadenopathy.  The thyroid gland appears normal.  The bony thorax is intact.  No destructive bony lesions or spinal canal compromise.  The heart is borderline enlarged.  No pericardial effusion.  There are mildly enlarged mediastinal lymph nodes which require follow- up.  The largest right pretracheal node measures 17 mm and there are 216 mm aorticopulmonary window nodes.  The esophagus is grossly normal.  The aorta is normal in caliber.  The pulmonary arterial tree is well opacified.  No filling defects to suggest pulmonary emboli.  Examination of the lung parenchyma demonstrates early emphysematous changes with apical bulla.  There is dependent atelectasis but no infiltrates, edema or effusions.  No worrisome nodules or masses.  The upper abdomen is grossly normal.   Review of the MIP images confirms the above findings.  IMPRESSION:  1.  No CT evidence of pulmonary emboli. 2.  Normal thoracic aorta. 3.  Mild cardiac enlargement. 4.  Mildly enlarged mediastinal lymph nodes.  I would suggest a follow-up chest  CT in 4 months to reevaluate.  CT ABDOMEN AND PELVIS  Findings: The liver is unremarkable.  No focal lesions or intrahepatic biliary dilatation. No obvious gallstones.  Possible mild gallbladder wall thickening but no pericholecystic inflammatory changes.  Ultrasound may be helpful for further evaluation.  No common bile duct dilatation.  Some reflux of contrast is noted down the IVC and into the hepatic veins.  This could be due to tricuspid regurgitation.  The spleen is normal in size.  No focal lesions.  The pancreas is normal.  The adrenal glands and kidneys are normal except for left renal cyst.  The stomach, duodenum, small bowel and colon are unremarkable.  No inflammatory changes or mass lesions.  The appendix is normal.  The aorta is normal in caliber.  The major branch vessels are normal. No mesenteric or retroperitoneal masses are adenopathy.  The bladder, prostate gland and seminal vesicles are unremarkable. No pelvic mass, adenopathy or free pelvic fluid collections.  No inguinal mass or hernia.  The bony structures are unremarkable.  Review of the MIP images confirms the above findings.  IMPRESSION:  1.  Mild gallbladder wall thickening is suspected.  No obvious gallstones or pericholecystic inflammatory change but ultrasound may be helpful for further evaluation if the patient has any right upper quadrant pain. 2.  No other significant abdominal/pelvic findings.  Original Report Authenticated By: P. Loralie Champagne, M.D.   Nm Hepatobiliary  12/01/2011  *RADIOLOGY REPORT*  Clinical Data: Abdominal pain, possible acute cholecystitis  NUCLEAR MEDICINE HEPATOHBILIARY INCLUDE GB  Radiopharmaceutical:  5 mCi Tc-31m mebrofenin  Comparison: None Correlation:  Ultrasound abdomen 11/30/2011, CT abdomen 11/30/2011  Findings: Mild delay in clearance of tracer from bloodstream suggesting mild hepatocellular dysfunction. Prompt excretion of tracer by the liver into the biliary system. Gallbladder is visualized at  10 minutes. Small bowel is visualized at 17 minutes. No evidence of common bile duct or cystic duct obstruction.  IMPRESSION: Patent biliary tree without evidence of acalculous cholecystitis. Somewhat prolonged clearance of tracer from bloodstream suggesting a mild degree of hepatocellular dysfunction.  Original Report Authenticated By: Lollie Marrow, M.D.   US Abdomen Complete  11/30/2011  *RADIOLOGY REPORT*  Clinical Data:  Nausea and vomiting.  COMPLETE ABDOMINAL ULTRASOUND  Comparison:  CT scan, same date.  Findings:  Gallbladder:  No definite gallstones but there is marked gallbladder wall thickening and a small amount of pericholecystic fluid which may suggest acalculus cholecystitis.  Common bile duct:  Normal in caliber measuring a maximum of 3.39mm.  Liver:  The liver is sonographically unremarkable.  There is normal echogenicity without focal lesions or intrahepatic biliary dilatation.  IVC:  Normal caliber.  Pancreas:  Limited visualization by ultrasound but appeared normal on the CT scan.  Spleen:  Normal size and echogenicity without focal lesions.  Right Kidney:  10.1 cm in length. Normal renal cortical thickness and echogenicity without focal lesions or hydronephrosis.  Left Kidney:  12.4 cm in length. Normal renal cortical thickness and echogenicity without focal lesions or hydronephrosis.4.7 x 3.3 x 4.0 cm simple appearing lower pole cyst.  Abdominal aorta:  Normal caliber.  IMPRESSION:  1.  Diffuse and fairly marked gallbladder wall thickening without definite gallstones.  Findings could suggest acalculous cholecystitis. 2.  Normal caliber common bile duct. 3.  No other significant abdominal findings.  Original Report Authenticated By: P. Loralie Champagne, M.D.   Ct Abdomen Pelvis W Contrast  11/30/2011  *RADIOLOGY REPORT*  Clinical Data:  Chest and abdominal pain.  CT ANGIOGRAPHY CHEST CT ABDOMEN AND PELVIS WITH CONTRAST  Technique:  Multidetector CT imaging of the chest was performed using the  standard protocol during bolus administration of intravenous contrast.  Multiplanar CT image reconstructions including MIPs were obtained to evaluate the vascular anatomy. Multidetector CT imaging of the abdomen and pelvis was performed using the standard protocol during bolus administration of intravenous contrast.  Contrast: OMNIPAQUE IOHEXOL 350 MG/ML SOLN  Comparison:   None.  CTA CHEST  Findings:  The chest wall is unremarkable.  No supraclavicular or axillary lymphadenopathy.  The thyroid gland appears normal.  The bony thorax is intact.  No destructive bony lesions or spinal canal compromise.  The heart is borderline enlarged.  No pericardial effusion.  There are mildly enlarged mediastinal lymph nodes which require follow- up.  The largest right pretracheal node measures 17 mm and there are 216 mm aorticopulmonary window nodes.  The esophagus is grossly normal.  The aorta is normal in caliber.  The pulmonary arterial tree is well opacified.  No filling defects to suggest pulmonary emboli.  Examination of the lung parenchyma demonstrates early emphysematous changes with apical bulla.  There is dependent atelectasis but no infiltrates, edema or effusions.  No worrisome nodules or masses.  The upper abdomen is grossly normal.   Review of the MIP images confirms the above findings.  IMPRESSION:  1.  No CT evidence of pulmonary emboli. 2.  Normal thoracic aorta. 3.  Mild cardiac enlargement. 4.  Mildly enlarged mediastinal lymph nodes.  I would suggest a follow-up chest CT in 4 months to reevaluate.  CT ABDOMEN AND PELVIS  Findings: The liver is unremarkable.  No focal lesions or intrahepatic biliary dilatation. No obvious gallstones.  Possible mild gallbladder wall thickening but no pericholecystic inflammatory changes.  Ultrasound may be helpful for further evaluation.  No common bile duct dilatation.  Some reflux of contrast is noted down the IVC and into the hepatic veins.  This could be due to  tricuspid regurgitation.  The spleen is normal in size.  No focal lesions.  The pancreas is normal.  The adrenal glands and kidneys are normal except for left renal cyst.  The stomach, duodenum, small bowel and colon are unremarkable.  No inflammatory changes or mass lesions.  The appendix is normal.  The aorta is normal in caliber.  The major branch vessels are normal. No mesenteric or retroperitoneal masses are adenopathy.  The bladder, prostate  gland and seminal vesicles are unremarkable. No pelvic mass, adenopathy or free pelvic fluid collections.  No inguinal mass or hernia.  The bony structures are unremarkable.  Review of the MIP images confirms the above findings.  IMPRESSION:  1.  Mild gallbladder wall thickening is suspected.  No obvious gallstones or pericholecystic inflammatory change but ultrasound may be helpful for further evaluation if the patient has any right upper quadrant pain. 2.  No other significant abdominal/pelvic findings.  Original Report Authenticated By: P. Loralie Champagne, M.D.   Dg Abd 2 Views  12/04/2011  *RADIOLOGY REPORT*  Clinical Data: Chest pain.  Shortness of breath.  Abdominal pain. Nausea and vomiting.  Smoker.  History of HIV.  ABDOMEN - 2 VIEW  Comparison: Acute abdomen series 11/30/2011.  MRI abdomen 10/24/2008.  Findings: Bowel gas pattern unremarkable without evidence of obstruction or significant ileus.  No evidence of free air or significant air fluid levels on the erect image.  Phleboliths in the pelvis.  No visible opaque urinary tract calculi.  Mild degenerative changes involving the lumbar spine.  IMPRESSION: No acute abdominal abnormality.  Original Report Authenticated By: Arnell Sieving, M.D.   Dg Abd Acute W/chest  11/30/2011  *RADIOLOGY REPORT*  Clinical Data: Shortness of breath.  Abdomen and back pain. Constipation.  ACUTE ABDOMEN SERIES (ABDOMEN 2 VIEW & CHEST 1 VIEW)  Comparison: Chest dated 07/26/2006.  Findings: Interval enlargement of the cardiac  silhouette.  Interval small amount of linear density in both mid and lower lung zones with interval minimal prominence of the interstitial markings.  No pleural fluid.  Normal bowel gas pattern without free peritoneal air.  Normal amount of stool.  Minimal scoliosis.  IMPRESSION:  1.  Interval cardiomegaly, pulmonary vascular congestion and mild interstitial pulmonary edema/chronic interstitial lung disease. 2.  Small amount of linear scarring or atelectasis bilaterally. 3.  No acute abdominal abnormality.  Original Report Authenticated By: Darrol Angel, M.D.    MEDICATIONS: Scheduled Meds:    . sodium chloride   Intravenous STAT  . carvedilol  3.125 mg Oral BID WC  . darunavir  800 mg Oral Q breakfast  . emtricitabine-tenofovir  1 tablet Oral Daily  . enoxaparin (LOVENOX) injection  40 mg Subcutaneous Q24H  . etravirine  200 mg Oral BID WC  . levofloxacin (LEVAQUIN) IV  750 mg Intravenous Q24H  . lisinopril  10 mg Oral Daily  . loratadine  10 mg Oral Daily  . pantoprazole (PROTONIX) IV  40 mg Intravenous Q12H  . piperacillin-tazobactam (ZOSYN)  IV  3.375 g Intravenous Q8H  . polyethylene glycol  17 g Oral BID  . ritonavir  100 mg Oral BID  . senna  2 tablet Oral QHS  . sodium chloride  3 mL Intravenous Q12H  . sodium phosphate  1 enema Rectal Once  . DISCONTD: furosemide  40 mg Intravenous Q12H  . DISCONTD: furosemide  40 mg Intravenous Daily  . DISCONTD: pantoprazole (PROTONIX) IV  40 mg Intravenous Q24H   Continuous Infusions:  PRN Meds:.acetaminophen, acetaminophen, bisacodyl, HYDROmorphone (DILAUDID) injection, ondansetron (ZOFRAN) IV, ondansetron, traMADol, zolpidem, DISCONTD:  morphine injection  Antibiotics: Anti-infectives     Start     Dose/Rate Route Frequency Ordered Stop   12/05/11 1200   levofloxacin (LEVAQUIN) IVPB 750 mg        750 mg 100 mL/hr over 90 Minutes Intravenous Every 24 hours 12/05/11 1119     12/05/11 0800   Darunavir Ethanolate (PREZISTA) tablet 800  mg  800 mg Oral Daily with breakfast 12/04/11 1805     12/05/11 0100   piperacillin-tazobactam (ZOSYN) IVPB 3.375 g        3.375 g 12.5 mL/hr over 240 Minutes Intravenous Every 8 hours 12/04/11 1805     12/04/11 2200   ritonavir (NORVIR) tablet 100 mg        100 mg Oral 2 times daily 12/04/11 1805     12/04/11 1930   emtricitabine-tenofovir (TRUVADA) 200-300 MG per tablet 1 tablet        1 tablet Oral Daily 12/04/11 1805     12/04/11 1930   etravirine (INTELENCE) tablet 200 mg        200 mg Oral 2 times daily with meals 12/04/11 1805     12/04/11 1630   piperacillin-tazobactam (ZOSYN) IVPB 3.375 g        3.375 g 100 mL/hr over 30 Minutes Intravenous  Once 12/04/11 1623 12/04/11 1713          Assessment/Plan: Active Problems: Abdominal Pain -unclear etiology-seen by GI-plan to repeat Abd USG and see if any Gall bladder issues,if not then to go ahead with EGD -just discharged on 7/3 after extensive evaluation-including CT abd/chest, Ultrasound Abd and HIDA scan. Also had a CCS consult as well. CT abdomen and Ultrasound-did show-thickened gall bladder with stones-however HIDA scan was negative for acalculous cholecystitis -having constipation-will try bowel regimen with enema to see if having BM will help -c/w PPI  PNA -seen better on CXR done 7/6 -continue with Zosyn and Levaquin -afebrile and No leukocytosis  CHF with exacerbation -this was acute systolic heart failure on admission-this is a new diagnosis for patient -much more compensated by exam today -Has h/o cocaine use till 2 years ago, and strong h/o ETOH use-these could be the potential etiologies here. Getting Cards to see patient-as may need cardiac cath at some point -c/w Lisinopril and Coreg -slight bump in creatinine, will hold lasix today-and restart orally in am -change Lasix to daily dosing, add low dose Coreg - 2 D Echo-on 7/6-EF 20-25 percent with diffuse hypokinesis -strict I&O -Daily  weights-increase in weight-?wrong reading   HIV (human immunodeficiency virus infection) -c/w ART's -CD4 on 09/01/11-500 -outpatient ID follow up  HTN -increase Coreg to 6.25 mg BID -Continue with Lisinopril at 10 mg daily -holding IV lasix today-will resume orally in am  Pleural Effusion -likely transudative from CHF -monitor  GERD -PPI  Disposition: -remain inpatient  DVT Prophylaxis: -on prophylactic Loveox  Code Status: Full Code  Jeoffrey Massed, MD  [facility] Pager [phone]  If 7PM-7AM, please contact night-coverage [email or web address] Password [email or web address] 12/06/2011, 9:03 AM   LOS: 2 days

## 2011-12-06 NOTE — Progress Notes (Signed)
Patient ID: [name], male   DOB: 04-17-1966, 46 y.o.   MRN: [id number] [facility] Progress Note  Subjective: Denies any pain today-says he had some pain yesterday after eating and vomited after both meals Just back from [place] this am  Objective:  Vital signs in last 24 hours: Temp:  [98.1 F (36.7 C)-98.3 F (36.8 C)] 98.1 F (36.7 C) (07/07 0553) Pulse Rate:  [83-109] 94  (07/07 0553) Resp:  [18] 18  (07/07 0553) BP: (149-155)/(97-116) 149/108 mmHg (07/07 0553) SpO2:  [92 %-95 %] 95 % (07/07 0553) Weight:  [162 lb 11.2 oz (73.8 kg)] 162 lb 11.2 oz (73.8 kg) (07/07 0553) Last BM Date: 12/05/11 General:   Alert,  Well-developed,    in NAD Heart:  Regular rate and rhythm; no murmurs Pulm;rhonci right base Abdomen:  Soft, nontender and nondistended. Normal bowel sounds, without guarding Extremities:  Without edema. Neurologic:  Alert and  oriented x4;  grossly normal neurologically. Psych:  Alert and cooperative. Normal mood and affect.  Intake/Output from previous day: 07/06 0701 - 07/07 0700 In: 2655 [P.O.:480; I.V.:1825; IV Piggyback:350] Out: 2000 [Urine:2000] Intake/Output this shift: Total I/O In: 50 [IV Piggyback:50] Out: -   Lab Results:  Basename 12/06/11 0650 12/05/11 0625 12/04/11 1419  WBC 8.1 7.3 8.1  HGB 13.4 11.8* 12.3*  HCT 39.7 35.5* 36.6*  PLT 293 261 260   BMET  Basename 12/06/11 0650 12/05/11 0625 12/04/11 1419  NA 142 144 141  K 3.0* 3.4* 3.5  CL 102 106 104  CO2 25 24 20   GLUCOSE 90 104* 88  BUN 17 13 10   CREATININE 1.38* 1.07 0.86  CALCIUM 9.4 9.0 9.4   LFT  Basename 12/06/11 0650  PROT 7.4  ALBUMIN 3.4*  AST 86*  ALT 73*  ALKPHOS 88  BILITOT 0.6  BILIDIR 0.3  IBILI 0.3    Assessment / Plan: #1 46 yo male with HIV on HAART with persistent epigastric pain, nausea and vomiting- workup last week +  Thickened GB walls and pericholecystic fluid  But no stones and HIDA negative- still concerned he has a diseased GB-  Await [place]- he is on ABX for pneumonia which may also help cholecystitis.  Will schedule for EGD in am with Dr. Russella Dar as well-will hold Lovenox until post procedure- Continue PPI twice daily Change diet to full liquid #2 Right pneumonia #3 .CHF- echo shows EF 20-25 % with diffuse severe hypokinesis, moderate mitral regurg Active Problems:  HIV (human immunodeficiency virus infection)  Abdominal pain  Pleural effusion, bilateral  CHF (congestive heart failure)  Nonspecific (abnormal) findings on radiological and other examination of biliary tract  Nausea with vomiting     LOS: 2 days   Amy Esterwood  12/06/2011, 10:42 AM    GI ATTENDING  Patient seen and examined. Agree with above. Results from cardiac echo and cardiology consultation note reviewed. Patient reports less, pain. On antibiotics for pneumonia. Exam unrevealing. On twice a day PPI. Ultrasound has been completed but not yet read. Plans for diagnostic endoscopy in a.m.  Wilhemina Bonito. Eda Keys., M.D. [facility] of Gastroenterology

## 2011-12-07 ENCOUNTER — Telehealth: Payer: Self-pay

## 2011-12-07 ENCOUNTER — Encounter (HOSPITAL_COMMUNITY): Payer: Self-pay | Admitting: Gastroenterology

## 2011-12-07 ENCOUNTER — Inpatient Hospital Stay (HOSPITAL_COMMUNITY): Payer: Medicare Other

## 2011-12-07 ENCOUNTER — Encounter (HOSPITAL_COMMUNITY)
Admission: EM | Disposition: A | Discharge status: Home / Self Care | Payer: Self-pay | Source: Home / Self Care | Attending: Internal Medicine

## 2011-12-07 DIAGNOSIS — R109 Unspecified abdominal pain: Secondary | ICD-10-CM | POA: Diagnosis not present

## 2011-12-07 DIAGNOSIS — R932 Abnormal findings on diagnostic imaging of liver and biliary tract: Secondary | ICD-10-CM | POA: Diagnosis not present

## 2011-12-07 DIAGNOSIS — R188 Other ascites: Secondary | ICD-10-CM | POA: Diagnosis not present

## 2011-12-07 DIAGNOSIS — J189 Pneumonia, unspecified organism: Secondary | ICD-10-CM

## 2011-12-07 DIAGNOSIS — I428 Other cardiomyopathies: Secondary | ICD-10-CM | POA: Diagnosis not present

## 2011-12-07 DIAGNOSIS — R112 Nausea with vomiting, unspecified: Secondary | ICD-10-CM | POA: Diagnosis not present

## 2011-12-07 DIAGNOSIS — I429 Cardiomyopathy, unspecified: Secondary | ICD-10-CM | POA: Diagnosis present

## 2011-12-07 DIAGNOSIS — I509 Heart failure, unspecified: Secondary | ICD-10-CM | POA: Diagnosis not present

## 2011-12-07 DIAGNOSIS — R1013 Epigastric pain: Secondary | ICD-10-CM | POA: Diagnosis not present

## 2011-12-07 DIAGNOSIS — Z21 Asymptomatic human immunodeficiency virus [HIV] infection status: Secondary | ICD-10-CM | POA: Diagnosis not present

## 2011-12-07 HISTORY — PX: ESOPHAGOGASTRODUODENOSCOPY: SHX5428

## 2011-12-07 LAB — HEPATIC FUNCTION PANEL
AST: 64 U/L — ABNORMAL HIGH (ref 0–37)
Bilirubin, Direct: 0.3 mg/dL (ref 0.0–0.3)
Indirect Bilirubin: 0.3 mg/dL (ref 0.3–0.9)
Total Protein: 6.7 g/dL (ref 6.0–8.3)

## 2011-12-07 LAB — BASIC METABOLIC PANEL
BUN: 16 mg/dL (ref 6–23)
CO2: 24 mEq/L (ref 19–32)
Chloride: 102 mEq/L (ref 96–112)
GFR calc Af Amer: 68 mL/min — ABNORMAL LOW (ref >90)
Potassium: 2.8 mEq/L — ABNORMAL LOW (ref 3.5–5.1)

## 2011-12-07 SURGERY — EGD (ESOPHAGOGASTRODUODENOSCOPY)
Anesthesia: Moderate Sedation

## 2011-12-07 MED ORDER — METRONIDAZOLE IN NACL 5-0.79 MG/ML-% IV SOLN
500.0000 mg | Freq: Three times a day (TID) | INTRAVENOUS | Status: DC
  Administered 2011-12-07 – 2011-12-08 (×3): 500 mg via INTRAVENOUS
  Filled 2011-12-07 (×5): qty 100

## 2011-12-07 MED ORDER — WHITE PETROLATUM GEL
Status: AC
  Administered 2011-12-07: 09:00:00
  Filled 2011-12-07: qty 5

## 2011-12-07 MED ORDER — POTASSIUM CHLORIDE 10 MEQ/100ML IV SOLN
10.0000 meq | INTRAVENOUS | Status: AC
  Administered 2011-12-07 (×3): 10 meq via INTRAVENOUS
  Filled 2011-12-07 (×3): qty 100

## 2011-12-07 MED ORDER — MIDAZOLAM HCL 10 MG/2ML IJ SOLN
INTRAMUSCULAR | Status: AC
  Filled 2011-12-07: qty 2

## 2011-12-07 MED ORDER — FENTANYL CITRATE 0.05 MG/ML IJ SOLN
INTRAMUSCULAR | Status: AC
Start: 2011-12-07 — End: 2011-12-07
  Filled 2011-12-07: qty 2

## 2011-12-07 MED ORDER — POTASSIUM CHLORIDE 10 MEQ/100ML IV SOLN
10.0000 meq | INTRAVENOUS | Status: DC
  Administered 2011-12-07: 10 meq via INTRAVENOUS
  Filled 2011-12-07 (×6): qty 100

## 2011-12-07 MED ORDER — FENTANYL CITRATE 0.05 MG/ML IJ SOLN
INTRAMUSCULAR | Status: DC | PRN
  Administered 2011-12-07 (×2): 25 ug via INTRAVENOUS

## 2011-12-07 MED ORDER — BUTAMBEN-TETRACAINE-BENZOCAINE 2-2-14 % EX AERO
INHALATION_SPRAY | CUTANEOUS | Status: DC | PRN
  Administered 2011-12-07: 2 via TOPICAL

## 2011-12-07 MED ORDER — MIDAZOLAM HCL 10 MG/2ML IJ SOLN
INTRAMUSCULAR | Status: DC | PRN
  Administered 2011-12-07 (×2): 2 mg via INTRAVENOUS

## 2011-12-07 MED ORDER — RITONAVIR 100 MG PO TABS
100.0000 mg | ORAL_TABLET | Freq: Two times a day (BID) | ORAL | Status: DC
  Administered 2011-12-07 – 2011-12-08 (×2): 100 mg via ORAL
  Filled 2011-12-07 (×4): qty 1

## 2011-12-07 MED ORDER — LISINOPRIL 20 MG PO TABS
20.0000 mg | ORAL_TABLET | Freq: Every day | ORAL | Status: DC
  Administered 2011-12-07 – 2011-12-09 (×3): 20 mg via ORAL
  Filled 2011-12-07 (×3): qty 1

## 2011-12-07 MED ORDER — DIPHENHYDRAMINE HCL 50 MG/ML IJ SOLN
INTRAMUSCULAR | Status: AC
  Filled 2011-12-07: qty 1

## 2011-12-07 NOTE — Progress Notes (Signed)
I have taken an interval history, reviewed the chart and examined the patient. I agree with the extender's note, impression and recommendations.   Kaedon Fanelli T. Caira Poche MD FACG 

## 2011-12-07 NOTE — Telephone Encounter (Signed)
Ellyn Hack Case Manager calling for [name] who has requested PCS services upon his hospital discharge.  Pt states he had this service before and it was discontinued.    Per Dr Luciana Axe, services can not be justified.  Stanton Kidney informed of the decision and will inform the patient.  Stanton Kidney  [phone] phone   Laurell Josephs, RN

## 2011-12-07 NOTE — OR Nursing (Signed)
Late entry for endoscopy procedure with Dr. Russella Dar. Pt stated only allergy was Bactrim, however when trying to chart med administration during procedure, Versed had a warning label.  Warning stated "reaction", however no allergy was listed.  Pt denied ever having reaction to Versed.  Dr. Russella Dar was consulted, and then pharmacy.  Spoke with Homero Fellers, Pharmacist, who stated that Versed was okay to administer to pt during procedure as only allergy was Bactrim and they are not related drugs.  Notified Dr. Russella Dar of conversation with pharmacy, and he stated okay to proceed with sedation with Fentanyl and Versed.  Pt tolerated sedation well, did not complain of any itching or any other reactions to medication.  Angelique Blonder, RN

## 2011-12-07 NOTE — Progress Notes (Addendum)
Patient ID: [name], male   DOB: 12/24/1965, 46 y.o.   MRN: [id number]   SUBJECTIVE:  Patient is stable in bed today. He has no chest pain or shortness of breath. He was placed on carvedilol 6.25 twice a day yesterday.   Filed Vitals:   12/06/11 0553 12/06/11 1400 12/06/11 2119 12/07/11 0513  BP: 149/108 144/103 128/91 133/95  Pulse: 94 112 93 90  Temp: 98.1 F (36.7 C) 97.5 F (36.4 C) 97.5 F (36.4 C) 98.4 F (36.9 C)  TempSrc: Oral Oral Oral Oral  Resp: 18 18 17 17   Height:      Weight: 162 lb 11.2 oz (73.8 kg)   164 lb 7.4 oz (74.6 kg)  SpO2: 95% 96% 96% 91%    Intake/Output Summary (Last 24 hours) at 12/07/11 0824 Last data filed at 12/06/11 2300  Gross per 24 hour  Intake    500 ml  Output      0 ml  Net    500 ml    LABS: Basic Metabolic Panel:  Basename 12/07/11 0550 12/06/11 0650  NA 142 142  K 2.8* 3.0*  CL 102 102  CO2 24 25  GLUCOSE 95 90  BUN 16 17  CREATININE 1.38* 1.38*  CALCIUM 9.2 9.4  MG -- --  PHOS -- --   Liver Function Tests:  [facility] 12/07/11 0550 12/06/11 0650  AST 64* 86*  ALT 71* 73*  ALKPHOS 88 88  BILITOT 0.6 0.6  PROT 6.7 7.4  ALBUMIN 3.3* 3.4*    Basename 12/04/11 1419  LIPASE 24  AMYLASE --   CBC:  Basename 12/06/11 0650 12/05/11 0625  WBC 8.1 7.3  NEUTROABS -- --  HGB 13.4 11.8*  HCT 39.7 35.5*  MCV 83.8 83.7  PLT 293 261   Cardiac Enzymes: No results found for this basename: CKTOTAL:3,CKMB:3,CKMBINDEX:3,TROPONINI:3 in the last 72 hours BNP: No components found with this basename: POCBNP:3 D-Dimer: No results found for this basename: DDIMER:2 in the last 72 hours Hemoglobin A1C: No results found for this basename: HGBA1C in the last 72 hours Fasting Lipid Panel: No results found for this basename: CHOL,HDL,LDLCALC,TRIG,CHOLHDL,LDLDIRECT in the last 72 hours Thyroid Function Tests: No results found for this basename: TSH,T4TOTAL,FREET3,T3FREE,THYROIDAB in the last 72 hours  RADIOLOGY: Dg Chest 2  View  12/05/2011  *RADIOLOGY REPORT*  Clinical Data: Epigastric abdominal pain.  Old right-sided chest pain.  History of hepatitis and HIV.  Follow up mild interstitial edema and effusions.  CHEST - 2 VIEW  Comparison: Two-view chest x-ray yesterday, 07/26/2006.  Findings: Cardiac silhouette markedly enlarged but stable.  Hilar and mediastinal contours otherwise unremarkable.  Interval resolution of the mild interstitial pulmonary edema.  Persistent bilateral pleural effusions, right greater than left, and associated consolidation in the right lower lobe and right middle lobe.  No new pulmonary parenchymal abnormalities.  Visualized bony thorax intact.  IMPRESSION:  1.  Resolution of interstitial pulmonary edema. 2.  Persistent small bilateral pleural effusions and consolidation in the right middle and lower lobes, likely reflecting pneumonia.  Original Report Authenticated By: Arnell Sieving, M.D.   Dg Chest 2 View  12/04/2011  **ADDENDUM** CREATED: 12/04/2011 15:34:03  There is a more recent chest radiograph from an acute abdomen series dated 11/30/2011 for comparison.  The currently demonstrated enlarged cardiac silhouette and prominent pulmonary vasculature and interstitial markings were previously present.  The currently demonstrated bilateral pleural fluid and right basilar opacity were not previously present.  **END ADDENDUM** SIGNED BY: Londell Moh.  Azucena Kuba, M.D.   12/04/2011  *RADIOLOGY REPORT*  Clinical Data: Chest pain and shortness of breath.  Smoker. Abdominal pain.  Nausea, vomiting and diarrhea.  CHEST - 2 VIEW  Comparison: Previous examinations.  Findings: Enlarged cardiac silhouette, not present on 07/26/2006. Patchy and linear density at the right lung base.  Small bilateral pleural effusions.  Increased prominence of the pulmonary vasculature and interstitial markings.  Unremarkable bones.  IMPRESSION:  1.  Interval cardiomegaly and mild changes of congestive heart failure with bilateral pleural  effusions. 2.  Right basilar atelectasis and possible pneumonia.  Original Report Authenticated By: Darrol Angel, M.D.   Ct Angio Chest W/cm &/or Wo Cm  11/30/2011  *RADIOLOGY REPORT*  Clinical Data:  Chest and abdominal pain.  CT ANGIOGRAPHY CHEST CT ABDOMEN AND PELVIS WITH CONTRAST  Technique:  Multidetector CT imaging of the chest was performed using the standard protocol during bolus administration of intravenous contrast.  Multiplanar CT image reconstructions including MIPs were obtained to evaluate the vascular anatomy. Multidetector CT imaging of the abdomen and pelvis was performed using the standard protocol during bolus administration of intravenous contrast.  Contrast: OMNIPAQUE IOHEXOL 350 MG/ML SOLN  Comparison:   None.  CTA CHEST  Findings:  The chest wall is unremarkable.  No supraclavicular or axillary lymphadenopathy.  The thyroid gland appears normal.  The bony thorax is intact.  No destructive bony lesions or spinal canal compromise.  The heart is borderline enlarged.  No pericardial effusion.  There are mildly enlarged mediastinal lymph nodes which require follow- up.  The largest right pretracheal node measures 17 mm and there are 216 mm aorticopulmonary window nodes.  The esophagus is grossly normal.  The aorta is normal in caliber.  The pulmonary arterial tree is well opacified.  No filling defects to suggest pulmonary emboli.  Examination of the lung parenchyma demonstrates early emphysematous changes with apical bulla.  There is dependent atelectasis but no infiltrates, edema or effusions.  No worrisome nodules or masses.  The upper abdomen is grossly normal.   Review of the MIP images confirms the above findings.  IMPRESSION:  1.  No CT evidence of pulmonary emboli. 2.  Normal thoracic aorta. 3.  Mild cardiac enlargement. 4.  Mildly enlarged mediastinal lymph nodes.  I would suggest a follow-up chest CT in 4 months to reevaluate.  CT ABDOMEN AND PELVIS  Findings: The liver is  unremarkable.  No focal lesions or intrahepatic biliary dilatation. No obvious gallstones.  Possible mild gallbladder wall thickening but no pericholecystic inflammatory changes.  Ultrasound may be helpful for further evaluation.  No common bile duct dilatation.  Some reflux of contrast is noted down the IVC and into the hepatic veins.  This could be due to tricuspid regurgitation.  The spleen is normal in size.  No focal lesions.  The pancreas is normal.  The adrenal glands and kidneys are normal except for left renal cyst.  The stomach, duodenum, small bowel and colon are unremarkable.  No inflammatory changes or mass lesions.  The appendix is normal.  The aorta is normal in caliber.  The major branch vessels are normal. No mesenteric or retroperitoneal masses are adenopathy.  The bladder, prostate gland and seminal vesicles are unremarkable. No pelvic mass, adenopathy or free pelvic fluid collections.  No inguinal mass or hernia.  The bony structures are unremarkable.  Review of the MIP images confirms the above findings.  IMPRESSION:  1.  Mild gallbladder wall thickening is suspected.  No obvious  gallstones or pericholecystic inflammatory change but ultrasound may be helpful for further evaluation if the patient has any right upper quadrant pain. 2.  No other significant abdominal/pelvic findings.  Original Report Authenticated By: P. Loralie Champagne, M.D.   Nm Hepatobiliary  12/01/2011  *RADIOLOGY REPORT*  Clinical Data: Abdominal pain, possible acute cholecystitis  NUCLEAR MEDICINE HEPATOHBILIARY INCLUDE GB  Radiopharmaceutical:  5 mCi Tc-66m mebrofenin  Comparison: None Correlation:  Ultrasound abdomen 11/30/2011, CT abdomen 11/30/2011  Findings: Mild delay in clearance of tracer from bloodstream suggesting mild hepatocellular dysfunction. Prompt excretion of tracer by the liver into the biliary system. Gallbladder is visualized at 10 minutes. Small bowel is visualized at 17 minutes. No evidence of common  bile duct or cystic duct obstruction.  IMPRESSION: Patent biliary tree without evidence of acalculous cholecystitis. Somewhat prolonged clearance of tracer from bloodstream suggesting a mild degree of hepatocellular dysfunction.  Original Report Authenticated By: Lollie Marrow, M.D.   US Abdomen Complete  11/30/2011  *RADIOLOGY REPORT*  Clinical Data:  Nausea and vomiting.  COMPLETE ABDOMINAL ULTRASOUND  Comparison:  CT scan, same date.  Findings:  Gallbladder:  No definite gallstones but there is marked gallbladder wall thickening and a small amount of pericholecystic fluid which may suggest acalculus cholecystitis.  Common bile duct:  Normal in caliber measuring a maximum of 3.26mm.  Liver:  The liver is sonographically unremarkable.  There is normal echogenicity without focal lesions or intrahepatic biliary dilatation.  IVC:  Normal caliber.  Pancreas:  Limited visualization by ultrasound but appeared normal on the CT scan.  Spleen:  Normal size and echogenicity without focal lesions.  Right Kidney:  10.1 cm in length. Normal renal cortical thickness and echogenicity without focal lesions or hydronephrosis.  Left Kidney:  12.4 cm in length. Normal renal cortical thickness and echogenicity without focal lesions or hydronephrosis.4.7 x 3.3 x 4.0 cm simple appearing lower pole cyst.  Abdominal aorta:  Normal caliber.  IMPRESSION:  1.  Diffuse and fairly marked gallbladder wall thickening without definite gallstones.  Findings could suggest acalculous cholecystitis. 2.  Normal caliber common bile duct. 3.  No other significant abdominal findings.  Original Report Authenticated By: P. Loralie Champagne, M.D.   Ct Abdomen Pelvis W Contrast  11/30/2011  *RADIOLOGY REPORT*  Clinical Data:  Chest and abdominal pain.  CT ANGIOGRAPHY CHEST CT ABDOMEN AND PELVIS WITH CONTRAST  Technique:  Multidetector CT imaging of the chest was performed using the standard protocol during bolus administration of intravenous contrast.   Multiplanar CT image reconstructions including MIPs were obtained to evaluate the vascular anatomy. Multidetector CT imaging of the abdomen and pelvis was performed using the standard protocol during bolus administration of intravenous contrast.  Contrast: OMNIPAQUE IOHEXOL 350 MG/ML SOLN  Comparison:   None.  CTA CHEST  Findings:  The chest wall is unremarkable.  No supraclavicular or axillary lymphadenopathy.  The thyroid gland appears normal.  The bony thorax is intact.  No destructive bony lesions or spinal canal compromise.  The heart is borderline enlarged.  No pericardial effusion.  There are mildly enlarged mediastinal lymph nodes which require follow- up.  The largest right pretracheal node measures 17 mm and there are 216 mm aorticopulmonary window nodes.  The esophagus is grossly normal.  The aorta is normal in caliber.  The pulmonary arterial tree is well opacified.  No filling defects to suggest pulmonary emboli.  Examination of the lung parenchyma demonstrates early emphysematous changes with apical bulla.  There is dependent atelectasis but  no infiltrates, edema or effusions.  No worrisome nodules or masses.  The upper abdomen is grossly normal.   Review of the MIP images confirms the above findings.  IMPRESSION:  1.  No CT evidence of pulmonary emboli. 2.  Normal thoracic aorta. 3.  Mild cardiac enlargement. 4.  Mildly enlarged mediastinal lymph nodes.  I would suggest a follow-up chest CT in 4 months to reevaluate.  CT ABDOMEN AND PELVIS  Findings: The liver is unremarkable.  No focal lesions or intrahepatic biliary dilatation. No obvious gallstones.  Possible mild gallbladder wall thickening but no pericholecystic inflammatory changes.  Ultrasound may be helpful for further evaluation.  No common bile duct dilatation.  Some reflux of contrast is noted down the IVC and into the hepatic veins.  This could be due to tricuspid regurgitation.  The spleen is normal in size.  No focal lesions.  The  pancreas is normal.  The adrenal glands and kidneys are normal except for left renal cyst.  The stomach, duodenum, small bowel and colon are unremarkable.  No inflammatory changes or mass lesions.  The appendix is normal.  The aorta is normal in caliber.  The major branch vessels are normal. No mesenteric or retroperitoneal masses are adenopathy.  The bladder, prostate gland and seminal vesicles are unremarkable. No pelvic mass, adenopathy or free pelvic fluid collections.  No inguinal mass or hernia.  The bony structures are unremarkable.  Review of the MIP images confirms the above findings.  IMPRESSION:  1.  Mild gallbladder wall thickening is suspected.  No obvious gallstones or pericholecystic inflammatory change but ultrasound may be helpful for further evaluation if the patient has any right upper quadrant pain. 2.  No other significant abdominal/pelvic findings.  Original Report Authenticated By: P. Loralie Champagne, M.D.   Dg Abd 2 Views  12/04/2011  *RADIOLOGY REPORT*  Clinical Data: Chest pain.  Shortness of breath.  Abdominal pain. Nausea and vomiting.  Smoker.  History of HIV.  ABDOMEN - 2 VIEW  Comparison: Acute abdomen series 11/30/2011.  MRI abdomen 10/24/2008.  Findings: Bowel gas pattern unremarkable without evidence of obstruction or significant ileus.  No evidence of free air or significant air fluid levels on the erect image.  Phleboliths in the pelvis.  No visible opaque urinary tract calculi.  Mild degenerative changes involving the lumbar spine.  IMPRESSION: No acute abdominal abnormality.  Original Report Authenticated By: Arnell Sieving, M.D.   Dg Abd Acute W/chest  11/30/2011  *RADIOLOGY REPORT*  Clinical Data: Shortness of breath.  Abdomen and back pain. Constipation.  ACUTE ABDOMEN SERIES (ABDOMEN 2 VIEW & CHEST 1 VIEW)  Comparison: Chest dated 07/26/2006.  Findings: Interval enlargement of the cardiac silhouette.  Interval small amount of linear density in both mid and lower lung  zones with interval minimal prominence of the interstitial markings.  No pleural fluid.  Normal bowel gas pattern without free peritoneal air.  Normal amount of stool.  Minimal scoliosis.  IMPRESSION:  1.  Interval cardiomegaly, pulmonary vascular congestion and mild interstitial pulmonary edema/chronic interstitial lung disease. 2.  Small amount of linear scarring or atelectasis bilaterally. 3.  No acute abdominal abnormality.  Original Report Authenticated By: Darrol Angel, M.D.    PHYSICAL EXAM   Patient is oriented to person time and place. Affect is normal. Lungs reveal scattered rhonchi. He is lying flat in bed. Cardiac exam reveals S1 and S2. There no clicks or significant murmurs. Abdomen is soft. There is no peripheral edema.  TELEMETRY: I have personally reviewed telemetry today, December 07, 2011. There sinus rhythm.   ASSESSMENT AND PLAN:   HYPERTENSION    Blood pressure remains elevated. I will increase his lisinopril to 20 mg daily.    Cardiomyopathy    The patient was placed on 6.25 of carvedilol yesterday. He continues to be hypertensive and relatively tachycardic. He received only one dose of carvedilol yesterday. I decided that it would not be appropriate to increase his carvedilol dose this morning.   Willa Rough 12/07/2011 8:24 AM

## 2011-12-07 NOTE — Progress Notes (Signed)
PATIENT DETAILS Name: [name] Age: 46 y.o. Sex: male Date of Birth: [birth year] Admit Date: 12/04/2011 PCP:No primary provider on file.  Subjective: Still with vomiting-but upper abd pain-better  Objective: Vital signs in last 24 hours: Filed Vitals:   12/06/11 0553 12/06/11 1400 12/06/11 2119 12/07/11 0513  BP: 149/108 144/103 128/91 133/95  Pulse: 94 112 93 90  Temp: 98.1 F (36.7 C) 97.5 F (36.4 C) 97.5 F (36.4 C) 98.4 F (36.9 C)  TempSrc: Oral Oral Oral Oral  Resp: 18 18 17 17   Height:      Weight: 73.8 kg (162 lb 11.2 oz)   74.6 kg (164 lb 7.4 oz)  SpO2: 95% 96% 96% 91%    Weight change: 5.1 kg (11 lb 3.9 oz)  Body mass index is 28.23 kg/(m^2).  Intake/Output from previous day:  Intake/Output Summary (Last 24 hours) at 12/07/11 1105 Last data filed at 12/07/11 0900  Gross per 24 hour  Intake    500 ml  Output      0 ml  Net    500 ml    PHYSICAL EXAM: Gen Exam: Awake and alert with clear speech.   Neck: Supple, No JVD.  Chest: B/L Clear.  No rales orrhonchi CVS: S1 S2 Regular, no murmurs.  Abdomen: soft, BS +, non tender currently, non distended.  Extremities: no edema, lower extremities warm to touch. Neurologic: Non Focal.   Skin: No Rash.  Wounds: N/A.    CONSULTS:  GI  LAB RESULTS: CBC  Lab 12/06/11 0650 12/05/11 0625 12/04/11 1419 12/01/11 0400 11/30/11 1132  WBC 8.1 7.3 8.1 9.9 10.2  HGB 13.4 11.8* 12.3* 11.9* 13.8  HCT 39.7 35.5* 36.6* 35.3* 41.5  PLT 293 261 260 206 226  MCV 83.8 83.7 83.4 85.1 85.0  MCH 28.3 27.8 28.0 28.7 28.3  MCHC 33.8 33.2 33.6 33.7 33.3  RDW 13.4 13.5 13.4 13.2 13.1  LYMPHSABS -- -- -- -- 1.6  MONOABS -- -- -- -- 0.5  EOSABS -- -- -- -- 0.0  BASOSABS -- -- -- -- 0.0  BANDABS -- -- -- -- --    Chemistries   Lab 12/07/11 0550 12/06/11 0650 12/05/11 0625 12/04/11 1419 12/01/11 0400  NA 142 142 144 141 139  K 2.8* 3.0* 3.4* 3.5 3.5  CL 102 102 106 104 108  CO2 24 25 24 20  18*  GLUCOSE 95 90  104* 88 94  BUN 16 17 13 10 12   CREATININE 1.38* 1.38* 1.07 0.86 1.20  CALCIUM 9.2 9.4 9.0 9.4 8.2*  MG -- -- -- -- --    CBG: No results found for this basename: GLUCAP:5 in the last 168 hours  GFR Estimated Creatinine Clearance: 60.6 ml/min (by C-G formula based on Cr of 1.38).  Coagulation profile No results found for this basename: INR:5,PROTIME:5 in the last 168 hours  Cardiac Enzymes  Lab 11/30/11 1833 11/30/11 1526  CKMB -- --  TROPONINI <0.30 <0.30  MYOGLOBIN -- --    No components found with this basename: POCBNP:3 No results found for this basename: DDIMER:2 in the last 72 hours No results found for this basename: HGBA1C:2 in the last 72 hours No results found for this basename: CHOL:2,HDL:2,LDLCALC:2,TRIG:2,CHOLHDL:2,LDLDIRECT:2 in the last 72 hours No results found for this basename: TSH,T4TOTAL,FREET3,T3FREE,THYROIDAB in the last 72 hours No results found for this basename: VITAMINB12:2,FOLATE:2,FERRITIN:2,TIBC:2,IRON:2,RETICCTPCT:2 in the last 72 hours  Basename 12/04/11 1419  LIPASE 24  AMYLASE --    Urine Studies No results found for  this basename: UACOL:2,UAPR:2,USPG:2,UPH:2,UTP:2,UGL:2,UKET:2,UBIL:2,UHGB:2,UNIT:2,UROB:2,ULEU:2,UEPI:2,UWBC:2,URBC:2,UBAC:2,CAST:2,CRYS:2,UCOM:2,BILUA:2 in the last 72 hours  MICROBIOLOGY: Recent Results (from the past 240 hour(s))  URINE CULTURE     Status: Normal   Collection Time   11/30/11 12:23 PM      Component Value Range Status Comment   Specimen Description URINE, CLEAN CATCH   Final    Special Requests ADDED 11/30/11 1423   Final    Culture  Setup Time 12/01/2011 00:37   Final    Colony Count NO GROWTH   Final    Culture NO GROWTH   Final    Report Status 12/01/2011 FINAL   Final   MRSA PCR SCREENING     Status: Normal   Collection Time   11/30/11  8:09 PM      Component Value Range Status Comment   MRSA by PCR NEGATIVE  NEGATIVE Final     RADIOLOGY STUDIES/RESULTS: Dg Chest 2 View  12/05/2011  *RADIOLOGY  REPORT*  Clinical Data: Epigastric abdominal pain.  Old right-sided chest pain.  History of hepatitis and HIV.  Follow up mild interstitial edema and effusions.  CHEST - 2 VIEW  Comparison: Two-view chest x-ray yesterday, 07/26/2006.  Findings: Cardiac silhouette markedly enlarged but stable.  Hilar and mediastinal contours otherwise unremarkable.  Interval resolution of the mild interstitial pulmonary edema.  Persistent bilateral pleural effusions, right greater than left, and associated consolidation in the right lower lobe and right middle lobe.  No new pulmonary parenchymal abnormalities.  Visualized bony thorax intact.  IMPRESSION:  1.  Resolution of interstitial pulmonary edema. 2.  Persistent small bilateral pleural effusions and consolidation in the right middle and lower lobes, likely reflecting pneumonia.  Original Report Authenticated By: Arnell Sieving, M.D.   Dg Chest 2 View  12/04/2011  **ADDENDUM** CREATED: 12/04/2011 15:34:03  There is a more recent chest radiograph from an acute abdomen series dated 11/30/2011 for comparison.  The currently demonstrated enlarged cardiac silhouette and prominent pulmonary vasculature and interstitial markings were previously present.  The currently demonstrated bilateral pleural fluid and right basilar opacity were not previously present.  **END ADDENDUM** SIGNED BY: Londell Moh. Azucena Kuba, M.D.   12/04/2011  *RADIOLOGY REPORT*  Clinical Data: Chest pain and shortness of breath.  Smoker. Abdominal pain.  Nausea, vomiting and diarrhea.  CHEST - 2 VIEW  Comparison: Previous examinations.  Findings: Enlarged cardiac silhouette, not present on 07/26/2006. Patchy and linear density at the right lung base.  Small bilateral pleural effusions.  Increased prominence of the pulmonary vasculature and interstitial markings.  Unremarkable bones.  IMPRESSION:  1.  Interval cardiomegaly and mild changes of congestive heart failure with bilateral pleural effusions. 2.  Right basilar  atelectasis and possible pneumonia.  Original Report Authenticated By: Darrol Angel, M.D.   Ct Angio Chest W/cm &/or Wo Cm  11/30/2011  *RADIOLOGY REPORT*  Clinical Data:  Chest and abdominal pain.  CT ANGIOGRAPHY CHEST CT ABDOMEN AND PELVIS WITH CONTRAST  Technique:  Multidetector CT imaging of the chest was performed using the standard protocol during bolus administration of intravenous contrast.  Multiplanar CT image reconstructions including MIPs were obtained to evaluate the vascular anatomy. Multidetector CT imaging of the abdomen and pelvis was performed using the standard protocol during bolus administration of intravenous contrast.  Contrast: OMNIPAQUE IOHEXOL 350 MG/ML SOLN  Comparison:   None.  CTA CHEST  Findings:  The chest wall is unremarkable.  No supraclavicular or axillary lymphadenopathy.  The thyroid gland appears normal.  The bony thorax is intact.  No destructive bony lesions or spinal canal compromise.  The heart is borderline enlarged.  No pericardial effusion.  There are mildly enlarged mediastinal lymph nodes which require follow- up.  The largest right pretracheal node measures 17 mm and there are 216 mm aorticopulmonary window nodes.  The esophagus is grossly normal.  The aorta is normal in caliber.  The pulmonary arterial tree is well opacified.  No filling defects to suggest pulmonary emboli.  Examination of the lung parenchyma demonstrates early emphysematous changes with apical bulla.  There is dependent atelectasis but no infiltrates, edema or effusions.  No worrisome nodules or masses.  The upper abdomen is grossly normal.   Review of the MIP images confirms the above findings.  IMPRESSION:  1.  No CT evidence of pulmonary emboli. 2.  Normal thoracic aorta. 3.  Mild cardiac enlargement. 4.  Mildly enlarged mediastinal lymph nodes.  I would suggest a follow-up chest CT in 4 months to reevaluate.  CT ABDOMEN AND PELVIS  Findings: The liver is unremarkable.  No focal lesions  or intrahepatic biliary dilatation. No obvious gallstones.  Possible mild gallbladder wall thickening but no pericholecystic inflammatory changes.  Ultrasound may be helpful for further evaluation.  No common bile duct dilatation.  Some reflux of contrast is noted down the IVC and into the hepatic veins.  This could be due to tricuspid regurgitation.  The spleen is normal in size.  No focal lesions.  The pancreas is normal.  The adrenal glands and kidneys are normal except for left renal cyst.  The stomach, duodenum, small bowel and colon are unremarkable.  No inflammatory changes or mass lesions.  The appendix is normal.  The aorta is normal in caliber.  The major branch vessels are normal. No mesenteric or retroperitoneal masses are adenopathy.  The bladder, prostate gland and seminal vesicles are unremarkable. No pelvic mass, adenopathy or free pelvic fluid collections.  No inguinal mass or hernia.  The bony structures are unremarkable.  Review of the MIP images confirms the above findings.  IMPRESSION:  1.  Mild gallbladder wall thickening is suspected.  No obvious gallstones or pericholecystic inflammatory change but ultrasound may be helpful for further evaluation if the patient has any right upper quadrant pain. 2.  No other significant abdominal/pelvic findings.  Original Report Authenticated By: P. Loralie Champagne, M.D.   Nm Hepatobiliary  12/01/2011  *RADIOLOGY REPORT*  Clinical Data: Abdominal pain, possible acute cholecystitis  NUCLEAR MEDICINE HEPATOHBILIARY INCLUDE GB  Radiopharmaceutical:  5 mCi Tc-79m mebrofenin  Comparison: None Correlation:  Ultrasound abdomen 11/30/2011, CT abdomen 11/30/2011  Findings: Mild delay in clearance of tracer from bloodstream suggesting mild hepatocellular dysfunction. Prompt excretion of tracer by the liver into the biliary system. Gallbladder is visualized at 10 minutes. Small bowel is visualized at 17 minutes. No evidence of common bile duct or cystic duct  obstruction.  IMPRESSION: Patent biliary tree without evidence of acalculous cholecystitis. Somewhat prolonged clearance of tracer from bloodstream suggesting a mild degree of hepatocellular dysfunction.  Original Report Authenticated By: Lollie Marrow, M.D.   US Abdomen Complete  11/30/2011  *RADIOLOGY REPORT*  Clinical Data:  Nausea and vomiting.  COMPLETE ABDOMINAL ULTRASOUND  Comparison:  CT scan, same date.  Findings:  Gallbladder:  No definite gallstones but there is marked gallbladder wall thickening and a small amount of pericholecystic fluid which may suggest acalculus cholecystitis.  Common bile duct:  Normal in caliber measuring a maximum of 3.10mm.  Liver:  The liver is sonographically unremarkable.  There is normal echogenicity without focal lesions or intrahepatic biliary dilatation.  IVC:  Normal caliber.  Pancreas:  Limited visualization by ultrasound but appeared normal on the CT scan.  Spleen:  Normal size and echogenicity without focal lesions.  Right Kidney:  10.1 cm in length. Normal renal cortical thickness and echogenicity without focal lesions or hydronephrosis.  Left Kidney:  12.4 cm in length. Normal renal cortical thickness and echogenicity without focal lesions or hydronephrosis.4.7 x 3.3 x 4.0 cm simple appearing lower pole cyst.  Abdominal aorta:  Normal caliber.  IMPRESSION:  1.  Diffuse and fairly marked gallbladder wall thickening without definite gallstones.  Findings could suggest acalculous cholecystitis. 2.  Normal caliber common bile duct. 3.  No other significant abdominal findings.  Original Report Authenticated By: P. Loralie Champagne, M.D.   Ct Abdomen Pelvis W Contrast  11/30/2011  *RADIOLOGY REPORT*  Clinical Data:  Chest and abdominal pain.  CT ANGIOGRAPHY CHEST CT ABDOMEN AND PELVIS WITH CONTRAST  Technique:  Multidetector CT imaging of the chest was performed using the standard protocol during bolus administration of intravenous contrast.  Multiplanar CT image  reconstructions including MIPs were obtained to evaluate the vascular anatomy. Multidetector CT imaging of the abdomen and pelvis was performed using the standard protocol during bolus administration of intravenous contrast.  Contrast: OMNIPAQUE IOHEXOL 350 MG/ML SOLN  Comparison:   None.  CTA CHEST  Findings:  The chest wall is unremarkable.  No supraclavicular or axillary lymphadenopathy.  The thyroid gland appears normal.  The bony thorax is intact.  No destructive bony lesions or spinal canal compromise.  The heart is borderline enlarged.  No pericardial effusion.  There are mildly enlarged mediastinal lymph nodes which require follow- up.  The largest right pretracheal node measures 17 mm and there are 216 mm aorticopulmonary window nodes.  The esophagus is grossly normal.  The aorta is normal in caliber.  The pulmonary arterial tree is well opacified.  No filling defects to suggest pulmonary emboli.  Examination of the lung parenchyma demonstrates early emphysematous changes with apical bulla.  There is dependent atelectasis but no infiltrates, edema or effusions.  No worrisome nodules or masses.  The upper abdomen is grossly normal.   Review of the MIP images confirms the above findings.  IMPRESSION:  1.  No CT evidence of pulmonary emboli. 2.  Normal thoracic aorta. 3.  Mild cardiac enlargement. 4.  Mildly enlarged mediastinal lymph nodes.  I would suggest a follow-up chest CT in 4 months to reevaluate.  CT ABDOMEN AND PELVIS  Findings: The liver is unremarkable.  No focal lesions or intrahepatic biliary dilatation. No obvious gallstones.  Possible mild gallbladder wall thickening but no pericholecystic inflammatory changes.  Ultrasound may be helpful for further evaluation.  No common bile duct dilatation.  Some reflux of contrast is noted down the IVC and into the hepatic veins.  This could be due to tricuspid regurgitation.  The spleen is normal in size.  No focal lesions.  The pancreas is normal.   The adrenal glands and kidneys are normal except for left renal cyst.  The stomach, duodenum, small bowel and colon are unremarkable.  No inflammatory changes or mass lesions.  The appendix is normal.  The aorta is normal in caliber.  The major branch vessels are normal. No mesenteric or retroperitoneal masses are adenopathy.  The bladder, prostate gland and seminal vesicles are unremarkable. No pelvic mass, adenopathy or free pelvic fluid collections.  No inguinal mass or hernia.  The bony structures are unremarkable.  Review of the MIP images confirms the above findings.  IMPRESSION:  1.  Mild gallbladder wall thickening is suspected.  No obvious gallstones or pericholecystic inflammatory change but ultrasound may be helpful for further evaluation if the patient has any right upper quadrant pain. 2.  No other significant abdominal/pelvic findings.  Original Report Authenticated By: P. Loralie Champagne, M.D.   Dg Abd 2 Views  12/04/2011  *RADIOLOGY REPORT*  Clinical Data: Chest pain.  Shortness of breath.  Abdominal pain. Nausea and vomiting.  Smoker.  History of HIV.  ABDOMEN - 2 VIEW  Comparison: Acute abdomen series 11/30/2011.  MRI abdomen 10/24/2008.  Findings: Bowel gas pattern unremarkable without evidence of obstruction or significant ileus.  No evidence of free air or significant air fluid levels on the erect image.  Phleboliths in the pelvis.  No visible opaque urinary tract calculi.  Mild degenerative changes involving the lumbar spine.  IMPRESSION: No acute abdominal abnormality.  Original Report Authenticated By: Arnell Sieving, M.D.   Dg Abd Acute W/chest  11/30/2011  *RADIOLOGY REPORT*  Clinical Data: Shortness of breath.  Abdomen and back pain. Constipation.  ACUTE ABDOMEN SERIES (ABDOMEN 2 VIEW & CHEST 1 VIEW)  Comparison: Chest dated 07/26/2006.  Findings: Interval enlargement of the cardiac silhouette.  Interval small amount of linear density in both mid and lower lung zones with interval  minimal prominence of the interstitial markings.  No pleural fluid.  Normal bowel gas pattern without free peritoneal air.  Normal amount of stool.  Minimal scoliosis.  IMPRESSION:  1.  Interval cardiomegaly, pulmonary vascular congestion and mild interstitial pulmonary edema/chronic interstitial lung disease. 2.  Small amount of linear scarring or atelectasis bilaterally. 3.  No acute abdominal abnormality.  Original Report Authenticated By: Darrol Angel, M.D.    MEDICATIONS: Scheduled Meds:    . carvedilol  6.25 mg Oral BID WC  . darunavir  800 mg Oral Q breakfast  . emtricitabine-tenofovir  1 tablet Oral Daily  . etravirine  200 mg Oral BID WC  . levofloxacin (LEVAQUIN) IV  750 mg Intravenous Q24H  . lisinopril  20 mg Oral Daily  . loratadine  10 mg Oral Daily  . metronidazole  500 mg Intravenous Q8H  . pantoprazole (PROTONIX) IV  40 mg Intravenous Q12H  . polyethylene glycol  17 g Oral BID  . potassium chloride  10 mEq Intravenous Q1 Hr x 4  . ritonavir  100 mg Oral BID  . senna  2 tablet Oral QHS  . sodium chloride  3 mL Intravenous Q12H  . white petrolatum      . DISCONTD: furosemide  40 mg Oral Daily  . DISCONTD: lisinopril  10 mg Oral Daily  . DISCONTD: piperacillin-tazobactam (ZOSYN)  IV  3.375 g Intravenous Q8H   Continuous Infusions:  PRN Meds:.acetaminophen, acetaminophen, bisacodyl, HYDROmorphone (DILAUDID) injection, ondansetron (ZOFRAN) IV, ondansetron, traMADol, zolpidem  Antibiotics: Anti-infectives     Start     Dose/Rate Route Frequency Ordered Stop   12/07/11 1200   metroNIDAZOLE (FLAGYL) IVPB 500 mg        500 mg 100 mL/hr over 60 Minutes Intravenous 3 times per day 12/07/11 0924     12/05/11 1200   levofloxacin (LEVAQUIN) IVPB 750 mg        750 mg 100 mL/hr over 90 Minutes Intravenous Every 24 hours 12/05/11 1119     12/05/11 0800   Darunavir Ethanolate (PREZISTA) tablet 800 mg  800 mg Oral Daily with breakfast 12/04/11 1805     12/05/11 0100    piperacillin-tazobactam (ZOSYN) IVPB 3.375 g  Status:  Discontinued        3.375 g 12.5 mL/hr over 240 Minutes Intravenous Every 8 hours 12/04/11 1805 12/07/11 0923   12/04/11 2200   ritonavir (NORVIR) tablet 100 mg        100 mg Oral 2 times daily 12/04/11 1805     12/04/11 1930   emtricitabine-tenofovir (TRUVADA) 200-300 MG per tablet 1 tablet        1 tablet Oral Daily 12/04/11 1805     12/04/11 1930   etravirine (INTELENCE) tablet 200 mg        200 mg Oral 2 times daily with meals 12/04/11 1805     12/04/11 1630   piperacillin-tazobactam (ZOSYN) IVPB 3.375 g        3.375 g 100 mL/hr over 30 Minutes Intravenous  Once 12/04/11 1623 12/04/11 1713          Assessment/Plan: Active Problems: Abdominal Pain -unclear etiology-seen by GI- repeat Abd USG reading still pending and see if any Gall bladder issues,if not then to go ahead with EGD-which is scheduled for 7/8 -just discharged on 7/3 after extensive evaluation-including CT abd/chest, Ultrasound Abd and HIDA scan. Also had a [facility] consult as well. CT abdomen and Ultrasound-did show-thickened gall bladder with stones-however HIDA scan was negative for acalculous cholecystitis -c/w PPI -will stop Zosyn, and cover with flagyl and Levaquin-in case Subacute Cholecystitis  PNA -seen better on CXR done 7/6 -continue with Levaquin -afebrile and No leukocytosis  CHF with exacerbation -this was acute systolic heart failure on admission-this is a new diagnosis for patient -much more compensated by exam today -Has h/o cocaine use till 2 years ago, and strong h/o ETOH use-these could be the potential etiologies here. Getting Cards to see patient-as may need cardiac cath at some point -c/w Lisinopril and Coreg-slowly titrating dose -slight bump in creatinine, continue to hold lasix as he continues to have vomiting - 2 D Echo-on 7/6-EF 20-25 percent with diffuse hypokinesis -strict I&O -Daily weights-increase in weight-?wrong  reading  ARF -mild, 2/2 to vomiting and Diuretics -hold lasix -recheck lytes in am   HIV (human immunodeficiency virus infection) -c/w ART's -CD4 on 09/01/11-500 -outpatient ID follow up  HTN -c/w Coreg to 6.25 mg BID - Lisinopril increased to 20 mg daily -watch creatitine  Pleural Effusion -likely transudative from CHF -monitor  GERD -PPI  Disposition: -remain inpatient  DVT Prophylaxis: -on prophylactic Loveox  Code Status: Full Code  Jeoffrey Massed, MD  [facility] Pager [phone]  If 7PM-7AM, please contact night-coverage [email or web address] Password [email or web address] 12/07/2011, 11:05 AM   LOS: 3 days

## 2011-12-07 NOTE — Interval H&P Note (Signed)
History and Physical Interval Note:  12/07/2011 11:41 AM  [name]  has presented today for surgery, with the diagnosis of HIV-epigastric pain, nausea,vomiting  The various methods of treatment have been discussed with the patient and family. After consideration of risks, benefits and other options for treatment, the patient has consented to  Procedure(s) (LRB): ESOPHAGOGASTRODUODENOSCOPY (EGD) (N/A) as a surgical intervention .  The patient's history has been reviewed, patient examined, no change in status, stable for surgery.  I have reviewed the patients' chart and labs.  Questions were answered to the patient's satisfaction.     Venita Lick. Russella Dar MD [facility]

## 2011-12-07 NOTE — Op Note (Signed)
[facility] [address] [place], [place]  [address]  ENDOSCOPY PROCEDURE REPORT  PATIENT:  David, Rollins  MR#:  [id number] BIRTHDATE:  July 19, 1965, 46 yrs. old  GENDER:  male ENDOSCOPIST:  Judie Petit T. Russella Dar, MD, [facility] Referred by:  [facility], PROCEDURE DATE:  12/07/2011 PROCEDURE:  EGD, diagnostic 43235 ASA CLASS:  Class III INDICATIONS:  epigastric pain, nausea and vomiting MEDICATIONS:    These medications were titrated to patient response per physician's verbal order, Fentanyl 50 mcg IV, Versed 4 mg IV TOPICAL ANESTHETIC:  Cetacaine Spray DESCRIPTION OF PROCEDURE:   After the risks benefits and alternatives of the procedure were thoroughly explained, informed consent was obtained.  The Pentax Gastroscope [id number] endoscope was introduced through the mouth and advanced to the second portion of the duodenum, without limitations.  The instrument was slowly withdrawn as the mucosa was fully examined. <<PROCEDUREIMAGES>> The esophagus and gastroesophageal junction were completely normal in appearance.  The stomach was entered and closely examined. The pylorus, antrum, angularis, and lesser curvature were well visualized, including a retroflexed view of the cardia and fundus. The stomach wall was normally distensable. The scope passed easily through the pylorus into the duodenum. The duodenal bulb was normal in appearance, as was the postbulbar duodenum.  Retroflexed views revealed no abnormalities.  The scope was then withdrawn from the patient and the procedure completed.  COMPLICATIONS:  None  ENDOSCOPIC IMPRESSION: 1) Normal EGD  RECOMMENDATIONS: 1) PPI qam 2) Consider GES if GI symptoms do not improve with treatment of CHF and PNA  [name] T. Russella Dar, MD, [facility]  CC:  David Flemings, MD  n. Rosalie DoctorVenita Lick. Javaria Knapke at 12/07/2011 12:00 PM  Derrek Gu, [phone]

## 2011-12-07 NOTE — Progress Notes (Signed)
     [facility] Daily Rounding Note 12/07/2011, 9:23 AM  SUBJECTIVE:       Nausea with POs.  No further abdominal pain. Frustrated that the diagnoses keeps shifting.   OBJECTIVE:         Vital signs in last 24 hours:    Temp:  [97.5 F (36.4 C)-98.4 F (36.9 C)] 98.4 F (36.9 C) (07/08 0513) Pulse Rate:  [90-112] 90  (07/08 0513) Resp:  [17-18] 17  (07/08 0513) BP: (128-144)/(91-103) 133/95 mmHg (07/08 0513) SpO2:  [91 %-96 %] 91 % (07/08 0513) Weight:  [164 lb 7.4 oz (74.6 kg)] 164 lb 7.4 oz (74.6 kg) (07/08 0513) Last BM Date: 12/07/11 General: Looks well   Heart: RRR Chest: Clear  B.  No dyspnea.  Abdomen: soft, NT, ND.  No HSM  Extremities: no pedal edema Neuro/Psych:  Anxious and mildly agitated. Appropriate, cooperative.   Intake/Output from previous day: 07/07 0701 - 07/08 0700 In: 550 [P.O.:300; IV Piggyback:250] Out: -   Intake/Output this shift:    Lab Results:  Basename 12/06/11 0650 12/05/11 0625 12/04/11 1419  WBC 8.1 7.3 8.1  HGB 13.4 11.8* 12.3*  HCT 39.7 35.5* 36.6*  PLT 293 261 260   BMET  Basename 12/07/11 0550 12/06/11 0650 12/05/11 0625  NA 142 142 144  K 2.8* 3.0* 3.4*  CL 102 102 106  CO2 24 25 24   GLUCOSE 95 90 104*  BUN 16 17 13   CREATININE 1.38* 1.38* 1.07  CALCIUM 9.2 9.4 9.0   LFT  Basename 12/07/11 0550 12/06/11 0650 12/05/11 0625  PROT 6.7 7.4 7.0  ALBUMIN 3.3* 3.4* 3.4*  AST 64* 86* 82*  ALT 71* 73* 57*  ALKPHOS 88 88 89  BILITOT 0.6 0.6 0.6  BILIDIR 0.3 0.3 --  IBILI 0.3 0.3 --   ASSESMENT: *  Epigastric pain, nausea, vomiting  *  Thickened GB wall on ultrasound, HIDA scan normal.  Transaminases elevated. Slight hepatocellular dysfunction on HIDA 7/2.  *  CHF,  BNP 11769, down from 16400. *  Hypokalemia.  *  AIDS/HIV.  Compliant with HAART therapy.  CD 4 count is 500.  PLAN: *  EGD early this afternoon.    LOS: 3 days   [name]  12/07/2011, 9:23 AM Pager: [phone]

## 2011-12-08 ENCOUNTER — Ambulatory Visit: Payer: Medicare Other | Admitting: Internal Medicine

## 2011-12-08 ENCOUNTER — Inpatient Hospital Stay (HOSPITAL_COMMUNITY): Payer: Medicare Other

## 2011-12-08 DIAGNOSIS — R112 Nausea with vomiting, unspecified: Secondary | ICD-10-CM | POA: Diagnosis not present

## 2011-12-08 DIAGNOSIS — J189 Pneumonia, unspecified organism: Secondary | ICD-10-CM | POA: Diagnosis not present

## 2011-12-08 DIAGNOSIS — K3189 Other diseases of stomach and duodenum: Secondary | ICD-10-CM | POA: Diagnosis not present

## 2011-12-08 DIAGNOSIS — R110 Nausea: Secondary | ICD-10-CM | POA: Diagnosis not present

## 2011-12-08 DIAGNOSIS — I428 Other cardiomyopathies: Secondary | ICD-10-CM | POA: Diagnosis not present

## 2011-12-08 DIAGNOSIS — I509 Heart failure, unspecified: Secondary | ICD-10-CM | POA: Diagnosis not present

## 2011-12-08 DIAGNOSIS — R109 Unspecified abdominal pain: Secondary | ICD-10-CM | POA: Diagnosis not present

## 2011-12-08 DIAGNOSIS — R932 Abnormal findings on diagnostic imaging of liver and biliary tract: Secondary | ICD-10-CM | POA: Diagnosis not present

## 2011-12-08 DIAGNOSIS — R1013 Epigastric pain: Secondary | ICD-10-CM | POA: Diagnosis not present

## 2011-12-08 DIAGNOSIS — Z21 Asymptomatic human immunodeficiency virus [HIV] infection status: Secondary | ICD-10-CM | POA: Diagnosis not present

## 2011-12-08 LAB — BASIC METABOLIC PANEL
Calcium: 9.1 mg/dL (ref 8.4–10.5)
Creatinine, Ser: 1.38 mg/dL — ABNORMAL HIGH (ref 0.50–1.35)
GFR calc non Af Amer: 59 mL/min — ABNORMAL LOW (ref >90)
Glucose, Bld: 83 mg/dL (ref 70–99)
Sodium: 138 mEq/L (ref 135–145)

## 2011-12-08 LAB — CBC
MCH: 28.3 pg (ref 26.0–34.0)
Platelets: 292 10*3/uL (ref 150–400)
RBC: 4.42 MIL/uL (ref 4.22–5.81)
RDW: 13.8 % (ref 11.5–15.5)

## 2011-12-08 LAB — OCCULT BLOOD X 1 CARD TO LAB, STOOL: Fecal Occult Bld: NEGATIVE

## 2011-12-08 MED ORDER — HYDRALAZINE HCL 20 MG/ML IJ SOLN
5.0000 mg | Freq: Once | INTRAMUSCULAR | Status: DC
  Filled 2011-12-08: qty 0.25

## 2011-12-08 MED ORDER — RITONAVIR 100 MG PO TABS
100.0000 mg | ORAL_TABLET | Freq: Every day | ORAL | Status: DC
  Administered 2011-12-09: 100 mg via ORAL
  Filled 2011-12-08 (×2): qty 1

## 2011-12-08 MED ORDER — METOCLOPRAMIDE HCL 5 MG/ML IJ SOLN
5.0000 mg | Freq: Three times a day (TID) | INTRAMUSCULAR | Status: DC
  Administered 2011-12-08 – 2011-12-09 (×2): 5 mg via INTRAVENOUS
  Filled 2011-12-08 (×5): qty 1

## 2011-12-08 MED ORDER — PANTOPRAZOLE SODIUM 40 MG PO TBEC: 40.0000 mg | DELAYED_RELEASE_TABLET | Freq: Every day | ORAL | Status: DC

## 2011-12-08 MED ORDER — TECHNETIUM TC 99M SULFUR COLLOID
2.0000 | Freq: Once | INTRAVENOUS | Status: AC | PRN
  Administered 2011-12-08: 2 via INTRAVENOUS

## 2011-12-08 NOTE — Care Management Note (Signed)
    Page 1 of 1   12/09/2011     12:46:48 PM   CARE MANAGEMENT NOTE 12/09/2011  Patient:  KODI, STEIL   Account Number:  [id number]  Date Initiated:  12/07/2011  Documentation initiated by:  Letha Cape  Subjective/Objective Assessment:   dx abd pain, 042, chf, vomiting, pna, arf  admit- lives alone.  pta independent.     Action/Plan:   Anticipated DC Date:  12/09/2011   Anticipated DC Plan:  HOME/SELF CARE      DC Planning Services  CM consult      Choice offered to / List presented to:             Status of service:  Completed, signed off Medicare Important Message given?   (If response is "NO", the following Medicare IM given date fields will be blank) Date Medicare IM given:   Date Additional Medicare IM given:    Discharge Disposition:  HOME/SELF CARE  Per UR Regulation:  Reviewed for med. necessity/level of care/duration of stay  If discussed at Long Length of Stay Meetings, dates discussed:    Comments:  PCP Dr. [name]  12/09/11 12:45 Letha Cape RN, BSN [phone] patient for dc today, no needs anticipated.  NCM will continue to follow for dc needs.  12/08/11 17:16  Letha Cape RN, bSN [phone] patient lives alone, pta independent.  Patient has medication coverage and transportation, patient for gastric emptying study today.  NCM will continue to follow for dc needs.

## 2011-12-08 NOTE — Progress Notes (Signed)
   SUBJECTIVE:  The patient had some nausea and vomitting this AM.  He denies any chest pain or SOB.   PHYSICAL EXAM Filed Vitals:   12/07/11 2050 12/08/11 0444 12/08/11 0448 12/08/11 0551  BP: 119/87  152/125 130/93  Pulse: 97  93   Temp: 97.3 F (36.3 C)  97.6 F (36.4 C)   TempSrc: Oral  Oral   Resp: 18  23   Height:      Weight:  153 lb 10.6 oz (69.7 kg)    SpO2: 97%  97%    General:  No distress Lungs:  Clear Heart:  RRR Abdomen:  Positive bowel sounds, no rebound no guarding Extremities:  No edema  LABS:  Results for orders placed during the hospital encounter of 12/04/11 (from the past 24 hour(s))  OCCULT BLOOD X 1 CARD TO LAB, STOOL     Status: Normal   Collection Time   12/08/11  3:29 AM      Component Value Range   Fecal Occult Bld NEGATIVE    BASIC METABOLIC PANEL     Status: Abnormal   Collection Time   12/08/11  5:38 AM      Component Value Range   Sodium 138  135 - 145 mEq/L   Potassium 3.5  3.5 - 5.1 mEq/L   Chloride 100  96 - 112 mEq/L   CO2 21  19 - 32 mEq/L   Glucose, Bld 83  70 - 99 mg/dL   BUN 18  6 - 23 mg/dL   Creatinine, Ser 0.45 (*) 0.50 - 1.35 mg/dL   Calcium 9.1  8.4 - 40.9 mg/dL   GFR calc non Af Amer 59 (*) >90 mL/min   GFR calc Af Amer 68 (*) >90 mL/min  CBC     Status: Abnormal   Collection Time   12/08/11  5:38 AM      Component Value Range   WBC 7.7  4.0 - 10.5 K/uL   RBC 4.42  4.22 - 5.81 MIL/uL   Hemoglobin 12.5 (*) 13.0 - 17.0 g/dL   HCT 81.1 (*) 91.4 - 78.2 %   MCV 84.4  78.0 - 100.0 fL   MCH 28.3  26.0 - 34.0 pg   MCHC 33.5  30.0 - 36.0 g/dL   RDW 95.6  21.3 - 08.6 %   Platelets 292  150 - 400 K/uL  OCCULT BLOOD X 1 CARD TO LAB, STOOL     Status: Normal   Collection Time   12/08/11  6:17 AM      Component Value Range   Fecal Occult Bld NEGATIVE      Intake/Output Summary (Last 24 hours) at 12/08/11 0849 Last data filed at 12/08/11 0600  Gross per 24 hour  Intake   1185 ml  Output      0 ml  Net   1185 ml     ASSESSMENT AND PLAN:  Cardiomyopathy:  I will plan an outpatient work up of the etiology of his cardiomyopathy with probable coronary CT angiography.  Beta blocker dose increased.  I agree with current ACE inhibitor dose.  Needs continued education.  Needs to get a scale at discharge and have education about salt restriction, volume control and no ETOH.  HTN:  This is being managed in the context of treating his CHF  Rollene Rotunda 12/08/2011 8:49 AM

## 2011-12-08 NOTE — Progress Notes (Addendum)
ANTIBIOTIC CONSULT NOTE - FOLLOW UP  Pharmacy Consult for Levaquin Indication: pneumonia   Allergies  Allergen Reactions  . Bactrim Rash    Patient Measurements: Height: 5\' 4"  (162.6 cm) Weight: 153 lb 10.6 oz (69.7 kg) IBW/kg (Calculated) : 59.2   Vital Signs: Temp: 97.6 F (36.4 C) (07/09 0448) Temp src: Oral (07/09 0448) BP: 130/93 mmHg (07/09 0551) Pulse Rate: 93  (07/09 0448) Intake/Output from previous day: 07/08 0701 - 07/09 0700 In: 1185 [P.O.:342; IV Piggyback:843] Out: -  Intake/Output from this shift:    Labs:  Basename 12/08/11 0538 12/07/11 0550 12/06/11 0650  WBC 7.7 -- 8.1  HGB 12.5* -- 13.4  PLT 292 -- 293  LABCREA -- -- --  CREATININE 1.38* 1.38* 1.38*   Estimated Creatinine Clearance: 54.8 ml/min (by C-G formula based on Cr of 1.38). No results found for this basename: VANCOTROUGH:2,VANCOPEAK:2,VANCORANDOM:2,GENTTROUGH:2,GENTPEAK:2,GENTRANDOM:2,TOBRATROUGH:2,TOBRAPEAK:2,TOBRARND:2,AMIKACINPEAK:2,AMIKACINTROU:2,AMIKACIN:2, in the last 72 hours   Microbiology: Recent Results (from the past 720 hour(s))  URINE CULTURE     Status: Normal   Collection Time   11/30/11 12:23 PM      Component Value Range Status Comment   Specimen Description URINE, CLEAN CATCH   Final    Special Requests ADDED 11/30/11 1423   Final    Culture  Setup Time 12/01/2011 00:37   Final    Colony Count NO GROWTH   Final    Culture NO GROWTH   Final    Report Status 12/01/2011 FINAL   Final   MRSA PCR SCREENING     Status: Normal   Collection Time   11/30/11  8:09 PM      Component Value Range Status Comment   MRSA by PCR NEGATIVE  NEGATIVE Final     Anti-infectives     Start     Dose/Rate Route Frequency Ordered Stop   12/09/11 0800   ritonavir (NORVIR) tablet 100 mg        100 mg Oral Daily with breakfast 12/08/11 1330     12/07/11 1334   ritonavir (NORVIR) tablet 100 mg  Status:  Discontinued        100 mg Oral 2 times daily with meals 12/07/11 1334 12/08/11 1330   12/07/11 1200   metroNIDAZOLE (FLAGYL) IVPB 500 mg  Status:  Discontinued        500 mg 100 mL/hr over 60 Minutes Intravenous 3 times per day 12/07/11 0924 12/08/11 1328   12/05/11 1200   levofloxacin (LEVAQUIN) IVPB 750 mg        750 mg 100 mL/hr over 90 Minutes Intravenous Every 24 hours 12/05/11 1119     12/05/11 0800   Darunavir Ethanolate (PREZISTA) tablet 800 mg        800 mg Oral Daily with breakfast 12/04/11 1805     12/05/11 0100   piperacillin-tazobactam (ZOSYN) IVPB 3.375 g  Status:  Discontinued        3.375 g 12.5 mL/hr over 240 Minutes Intravenous Every 8 hours 12/04/11 1805 12/07/11 0923   12/04/11 2200   ritonavir (NORVIR) tablet 100 mg  Status:  Discontinued        100 mg Oral 2 times daily 12/04/11 1805 12/07/11 1334   12/04/11 1930   emtricitabine-tenofovir (TRUVADA) 200-300 MG per tablet 1 tablet        1 tablet Oral Daily 12/04/11 1805     12/04/11 1930   etravirine (INTELENCE) tablet 200 mg        200 mg Oral 2 times daily with  meals 12/04/11 1805     12/04/11 1630  piperacillin-tazobactam (ZOSYN) IVPB 3.375 g       3.375 g 100 mL/hr over 30 Minutes Intravenous  Once 12/04/11 1623 12/04/11 1713          Assessment: 46 y.o. M on Levaquin for empiric PNA coverage. Today is D#4 of empiric coverage. Tmax/24h: 99, WBC WNL, SCr 1.38, CrCl~55-60 ml/min. Dose remains appropriate. Will hold off on switching to po for now as patient still noted to have nausea and vomiting.  Goal of Therapy:  Eradication of Infection  Plan:  1. Continue Levaquin 750 mg IV every 24 hours 2. Will continue to follow renal function, culture results, LOT, and antibiotic de-escalation plans   Georgina Pillion, PharmD, BCPS Clinical Pharmacist Pager: [phone] 12/08/2011 1:38 PM

## 2011-12-08 NOTE — Progress Notes (Signed)
PATIENT DETAILS Name: [name] Age: 46 y.o. Sex: male Date of Birth: [birth year] Admit Date: 12/04/2011 PCP:No primary provider on file.  Subjective: Abdominal pain resolved Still vomiting  Objective: Vital signs in last 24 hours: Filed Vitals:   12/07/11 2050 12/08/11 0444 12/08/11 0448 12/08/11 0551  BP: 119/87  152/125 130/93  Pulse: 97  93   Temp: 97.3 F (36.3 C)  97.6 F (36.4 C)   TempSrc: Oral  Oral   Resp: 18  23   Height:      Weight:  69.7 kg (153 lb 10.6 oz)    SpO2: 97%  97%     Weight change: -4.9 kg (-10 lb 12.8 oz)  Body mass index is 26.38 kg/(m^2).  Intake/Output from previous day:  Intake/Output Summary (Last 24 hours) at 12/08/11 1316 Last data filed at 12/08/11 0600  Gross per 24 hour  Intake   1185 ml  Output      0 ml  Net   1185 ml    PHYSICAL EXAM: Gen Exam: Awake and alert with clear speech.   Neck: Supple, No JVD.  Chest: B/L Clear.  No rales orrhonchi CVS: S1 S2 Regular, no murmurs.  Abdomen: soft, BS +, non tender currently, non distended.  Extremities: no edema, lower extremities warm to touch. Neurologic: Non Focal.   Skin: No Rash.  Wounds: N/A.    CONSULTS:  GI  LAB RESULTS: CBC  Lab 12/08/11 0538 12/06/11 0650 12/05/11 0625 12/04/11 1419  WBC 7.7 8.1 7.3 8.1  HGB 12.5* 13.4 11.8* 12.3*  HCT 37.3* 39.7 35.5* 36.6*  PLT 292 293 261 260  MCV 84.4 83.8 83.7 83.4  MCH 28.3 28.3 27.8 28.0  MCHC 33.5 33.8 33.2 33.6  RDW 13.8 13.4 13.5 13.4  LYMPHSABS -- -- -- --  MONOABS -- -- -- --  EOSABS -- -- -- --  BASOSABS -- -- -- --  BANDABS -- -- -- --    Chemistries   Lab 12/08/11 0538 12/07/11 0550 12/06/11 0650 12/05/11 0625 12/04/11 1419  NA 138 142 142 144 141  K 3.5 2.8* 3.0* 3.4* 3.5  CL 100 102 102 106 104  CO2 21 24 25 24 20   GLUCOSE 83 95 90 104* 88  BUN 18 16 17 13 10   CREATININE 1.38* 1.38* 1.38* 1.07 0.86  CALCIUM 9.1 9.2 9.4 9.0 9.4  MG -- -- -- -- --    CBG: No results found for this  basename: GLUCAP:5 in the last 168 hours  GFR Estimated Creatinine Clearance: 54.8 ml/min (by C-G formula based on Cr of 1.38).  Coagulation profile No results found for this basename: INR:5,PROTIME:5 in the last 168 hours  Cardiac Enzymes No results found for this basename: CK:3,CKMB:3,TROPONINI:3,MYOGLOBIN:3 in the last 168 hours  No components found with this basename: POCBNP:3 No results found for this basename: DDIMER:2 in the last 72 hours No results found for this basename: HGBA1C:2 in the last 72 hours No results found for this basename: CHOL:2,HDL:2,LDLCALC:2,TRIG:2,CHOLHDL:2,LDLDIRECT:2 in the last 72 hours No results found for this basename: TSH,T4TOTAL,FREET3,T3FREE,THYROIDAB in the last 72 hours No results found for this basename: VITAMINB12:2,FOLATE:2,FERRITIN:2,TIBC:2,IRON:2,RETICCTPCT:2 in the last 72 hours No results found for this basename: LIPASE:2,AMYLASE:2 in the last 72 hours  Urine Studies No results found for this basename: UACOL:2,UAPR:2,USPG:2,UPH:2,UTP:2,UGL:2,UKET:2,UBIL:2,UHGB:2,UNIT:2,UROB:2,ULEU:2,UEPI:2,UWBC:2,URBC:2,UBAC:2,CAST:2,CRYS:2,UCOM:2,BILUA:2 in the last 72 hours  MICROBIOLOGY: Recent Results (from the past 240 hour(s))  URINE CULTURE     Status: Normal   Collection Time   11/30/11 12:23 PM  Component Value Range Status Comment   Specimen Description URINE, CLEAN CATCH   Final    Special Requests ADDED 11/30/11 1423   Final    Culture  Setup Time 12/01/2011 00:37   Final    Colony Count NO GROWTH   Final    Culture NO GROWTH   Final    Report Status 12/01/2011 FINAL   Final   MRSA PCR SCREENING     Status: Normal   Collection Time   11/30/11  8:09 PM      Component Value Range Status Comment   MRSA by PCR NEGATIVE  NEGATIVE Final     RADIOLOGY STUDIES/RESULTS: Dg Chest 2 View  12/05/2011  *RADIOLOGY REPORT*  Clinical Data: Epigastric abdominal pain.  Old right-sided chest pain.  History of hepatitis and HIV.  Follow up mild  interstitial edema and effusions.  CHEST - 2 VIEW  Comparison: Two-view chest x-ray yesterday, 07/26/2006.  Findings: Cardiac silhouette markedly enlarged but stable.  Hilar and mediastinal contours otherwise unremarkable.  Interval resolution of the mild interstitial pulmonary edema.  Persistent bilateral pleural effusions, right greater than left, and associated consolidation in the right lower lobe and right middle lobe.  No new pulmonary parenchymal abnormalities.  Visualized bony thorax intact.  IMPRESSION:  1.  Resolution of interstitial pulmonary edema. 2.  Persistent small bilateral pleural effusions and consolidation in the right middle and lower lobes, likely reflecting pneumonia.  Original Report Authenticated By: Arnell Sieving, M.D.   Dg Chest 2 View  12/04/2011  **ADDENDUM** CREATED: 12/04/2011 15:34:03  There is a more recent chest radiograph from an acute abdomen series dated 11/30/2011 for comparison.  The currently demonstrated enlarged cardiac silhouette and prominent pulmonary vasculature and interstitial markings were previously present.  The currently demonstrated bilateral pleural fluid and right basilar opacity were not previously present.  **END ADDENDUM** SIGNED BY: Londell Moh. Azucena Kuba, M.D.   12/04/2011  *RADIOLOGY REPORT*  Clinical Data: Chest pain and shortness of breath.  Smoker. Abdominal pain.  Nausea, vomiting and diarrhea.  CHEST - 2 VIEW  Comparison: Previous examinations.  Findings: Enlarged cardiac silhouette, not present on 07/26/2006. Patchy and linear density at the right lung base.  Small bilateral pleural effusions.  Increased prominence of the pulmonary vasculature and interstitial markings.  Unremarkable bones.  IMPRESSION:  1.  Interval cardiomegaly and mild changes of congestive heart failure with bilateral pleural effusions. 2.  Right basilar atelectasis and possible pneumonia.  Original Report Authenticated By: Darrol Angel, M.D.   Ct Angio Chest W/cm &/or Wo  Cm  11/30/2011  *RADIOLOGY REPORT*  Clinical Data:  Chest and abdominal pain.  CT ANGIOGRAPHY CHEST CT ABDOMEN AND PELVIS WITH CONTRAST  Technique:  Multidetector CT imaging of the chest was performed using the standard protocol during bolus administration of intravenous contrast.  Multiplanar CT image reconstructions including MIPs were obtained to evaluate the vascular anatomy. Multidetector CT imaging of the abdomen and pelvis was performed using the standard protocol during bolus administration of intravenous contrast.  Contrast: OMNIPAQUE IOHEXOL 350 MG/ML SOLN  Comparison:   None.  CTA CHEST  Findings:  The chest wall is unremarkable.  No supraclavicular or axillary lymphadenopathy.  The thyroid gland appears normal.  The bony thorax is intact.  No destructive bony lesions or spinal canal compromise.  The heart is borderline enlarged.  No pericardial effusion.  There are mildly enlarged mediastinal lymph nodes which require follow- up.  The largest right pretracheal node measures 17 mm and  there are 216 mm aorticopulmonary window nodes.  The esophagus is grossly normal.  The aorta is normal in caliber.  The pulmonary arterial tree is well opacified.  No filling defects to suggest pulmonary emboli.  Examination of the lung parenchyma demonstrates early emphysematous changes with apical bulla.  There is dependent atelectasis but no infiltrates, edema or effusions.  No worrisome nodules or masses.  The upper abdomen is grossly normal.   Review of the MIP images confirms the above findings.  IMPRESSION:  1.  No CT evidence of pulmonary emboli. 2.  Normal thoracic aorta. 3.  Mild cardiac enlargement. 4.  Mildly enlarged mediastinal lymph nodes.  I would suggest a follow-up chest CT in 4 months to reevaluate.  CT ABDOMEN AND PELVIS  Findings: The liver is unremarkable.  No focal lesions or intrahepatic biliary dilatation. No obvious gallstones.  Possible mild gallbladder wall thickening but no pericholecystic  inflammatory changes.  Ultrasound may be helpful for further evaluation.  No common bile duct dilatation.  Some reflux of contrast is noted down the IVC and into the hepatic veins.  This could be due to tricuspid regurgitation.  The spleen is normal in size.  No focal lesions.  The pancreas is normal.  The adrenal glands and kidneys are normal except for left renal cyst.  The stomach, duodenum, small bowel and colon are unremarkable.  No inflammatory changes or mass lesions.  The appendix is normal.  The aorta is normal in caliber.  The major branch vessels are normal. No mesenteric or retroperitoneal masses are adenopathy.  The bladder, prostate gland and seminal vesicles are unremarkable. No pelvic mass, adenopathy or free pelvic fluid collections.  No inguinal mass or hernia.  The bony structures are unremarkable.  Review of the MIP images confirms the above findings.  IMPRESSION:  1.  Mild gallbladder wall thickening is suspected.  No obvious gallstones or pericholecystic inflammatory change but ultrasound may be helpful for further evaluation if the patient has any right upper quadrant pain. 2.  No other significant abdominal/pelvic findings.  Original Report Authenticated By: P. Loralie Champagne, M.D.   Nm Hepatobiliary  12/01/2011  *RADIOLOGY REPORT*  Clinical Data: Abdominal pain, possible acute cholecystitis  NUCLEAR MEDICINE HEPATOHBILIARY INCLUDE GB  Radiopharmaceutical:  5 mCi Tc-62m mebrofenin  Comparison: None Correlation:  Ultrasound abdomen 11/30/2011, CT abdomen 11/30/2011  Findings: Mild delay in clearance of tracer from bloodstream suggesting mild hepatocellular dysfunction. Prompt excretion of tracer by the liver into the biliary system. Gallbladder is visualized at 10 minutes. Small bowel is visualized at 17 minutes. No evidence of common bile duct or cystic duct obstruction.  IMPRESSION: Patent biliary tree without evidence of acalculous cholecystitis. Somewhat prolonged clearance of tracer  from bloodstream suggesting a mild degree of hepatocellular dysfunction.  Original Report Authenticated By: Lollie Marrow, M.D.   US Abdomen Complete  11/30/2011  *RADIOLOGY REPORT*  Clinical Data:  Nausea and vomiting.  COMPLETE ABDOMINAL ULTRASOUND  Comparison:  CT scan, same date.  Findings:  Gallbladder:  No definite gallstones but there is marked gallbladder wall thickening and a small amount of pericholecystic fluid which may suggest acalculus cholecystitis.  Common bile duct:  Normal in caliber measuring a maximum of 3.87mm.  Liver:  The liver is sonographically unremarkable.  There is normal echogenicity without focal lesions or intrahepatic biliary dilatation.  IVC:  Normal caliber.  Pancreas:  Limited visualization by ultrasound but appeared normal on the CT scan.  Spleen:  Normal size and echogenicity without focal  lesions.  Right Kidney:  10.1 cm in length. Normal renal cortical thickness and echogenicity without focal lesions or hydronephrosis.  Left Kidney:  12.4 cm in length. Normal renal cortical thickness and echogenicity without focal lesions or hydronephrosis.4.7 x 3.3 x 4.0 cm simple appearing lower pole cyst.  Abdominal aorta:  Normal caliber.  IMPRESSION:  1.  Diffuse and fairly marked gallbladder wall thickening without definite gallstones.  Findings could suggest acalculous cholecystitis. 2.  Normal caliber common bile duct. 3.  No other significant abdominal findings.  Original Report Authenticated By: P. Loralie Champagne, M.D.   Ct Abdomen Pelvis W Contrast  11/30/2011  *RADIOLOGY REPORT*  Clinical Data:  Chest and abdominal pain.  CT ANGIOGRAPHY CHEST CT ABDOMEN AND PELVIS WITH CONTRAST  Technique:  Multidetector CT imaging of the chest was performed using the standard protocol during bolus administration of intravenous contrast.  Multiplanar CT image reconstructions including MIPs were obtained to evaluate the vascular anatomy. Multidetector CT imaging of the abdomen and pelvis was  performed using the standard protocol during bolus administration of intravenous contrast.  Contrast: OMNIPAQUE IOHEXOL 350 MG/ML SOLN  Comparison:   None.  CTA CHEST  Findings:  The chest wall is unremarkable.  No supraclavicular or axillary lymphadenopathy.  The thyroid gland appears normal.  The bony thorax is intact.  No destructive bony lesions or spinal canal compromise.  The heart is borderline enlarged.  No pericardial effusion.  There are mildly enlarged mediastinal lymph nodes which require follow- up.  The largest right pretracheal node measures 17 mm and there are 216 mm aorticopulmonary window nodes.  The esophagus is grossly normal.  The aorta is normal in caliber.  The pulmonary arterial tree is well opacified.  No filling defects to suggest pulmonary emboli.  Examination of the lung parenchyma demonstrates early emphysematous changes with apical bulla.  There is dependent atelectasis but no infiltrates, edema or effusions.  No worrisome nodules or masses.  The upper abdomen is grossly normal.   Review of the MIP images confirms the above findings.  IMPRESSION:  1.  No CT evidence of pulmonary emboli. 2.  Normal thoracic aorta. 3.  Mild cardiac enlargement. 4.  Mildly enlarged mediastinal lymph nodes.  I would suggest a follow-up chest CT in 4 months to reevaluate.  CT ABDOMEN AND PELVIS  Findings: The liver is unremarkable.  No focal lesions or intrahepatic biliary dilatation. No obvious gallstones.  Possible mild gallbladder wall thickening but no pericholecystic inflammatory changes.  Ultrasound may be helpful for further evaluation.  No common bile duct dilatation.  Some reflux of contrast is noted down the IVC and into the hepatic veins.  This could be due to tricuspid regurgitation.  The spleen is normal in size.  No focal lesions.  The pancreas is normal.  The adrenal glands and kidneys are normal except for left renal cyst.  The stomach, duodenum, small bowel and colon are unremarkable.   No inflammatory changes or mass lesions.  The appendix is normal.  The aorta is normal in caliber.  The major branch vessels are normal. No mesenteric or retroperitoneal masses are adenopathy.  The bladder, prostate gland and seminal vesicles are unremarkable. No pelvic mass, adenopathy or free pelvic fluid collections.  No inguinal mass or hernia.  The bony structures are unremarkable.  Review of the MIP images confirms the above findings.  IMPRESSION:  1.  Mild gallbladder wall thickening is suspected.  No obvious gallstones or pericholecystic inflammatory change but ultrasound may be  helpful for further evaluation if the patient has any right upper quadrant pain. 2.  No other significant abdominal/pelvic findings.  Original Report Authenticated By: P. Loralie Champagne, M.D.   Dg Abd 2 Views  12/04/2011  *RADIOLOGY REPORT*  Clinical Data: Chest pain.  Shortness of breath.  Abdominal pain. Nausea and vomiting.  Smoker.  History of HIV.  ABDOMEN - 2 VIEW  Comparison: Acute abdomen series 11/30/2011.  MRI abdomen 10/24/2008.  Findings: Bowel gas pattern unremarkable without evidence of obstruction or significant ileus.  No evidence of free air or significant air fluid levels on the erect image.  Phleboliths in the pelvis.  No visible opaque urinary tract calculi.  Mild degenerative changes involving the lumbar spine.  IMPRESSION: No acute abdominal abnormality.  Original Report Authenticated By: Arnell Sieving, M.D.   Dg Abd Acute W/chest  11/30/2011  *RADIOLOGY REPORT*  Clinical Data: Shortness of breath.  Abdomen and back pain. Constipation.  ACUTE ABDOMEN SERIES (ABDOMEN 2 VIEW & CHEST 1 VIEW)  Comparison: Chest dated 07/26/2006.  Findings: Interval enlargement of the cardiac silhouette.  Interval small amount of linear density in both mid and lower lung zones with interval minimal prominence of the interstitial markings.  No pleural fluid.  Normal bowel gas pattern without free peritoneal air.  Normal  amount of stool.  Minimal scoliosis.  IMPRESSION:  1.  Interval cardiomegaly, pulmonary vascular congestion and mild interstitial pulmonary edema/chronic interstitial lung disease. 2.  Small amount of linear scarring or atelectasis bilaterally. 3.  No acute abdominal abnormality.  Original Report Authenticated By: Darrol Angel, M.D.    MEDICATIONS: Scheduled Meds:    . carvedilol  6.25 mg Oral BID WC  . darunavir  800 mg Oral Q breakfast  . emtricitabine-tenofovir  1 tablet Oral Daily  . etravirine  200 mg Oral BID WC  . hydrALAZINE  5 mg Intravenous Once  . levofloxacin (LEVAQUIN) IV  750 mg Intravenous Q24H  . lisinopril  20 mg Oral Daily  . loratadine  10 mg Oral Daily  . metronidazole  500 mg Intravenous Q8H  . pantoprazole (PROTONIX) IV  40 mg Intravenous Q12H  . polyethylene glycol  17 g Oral BID  . potassium chloride  10 mEq Intravenous Q1 Hr x 3  . ritonavir  100 mg Oral BID WC  . senna  2 tablet Oral QHS  . sodium chloride  3 mL Intravenous Q12H  . DISCONTD: ritonavir  100 mg Oral BID   Continuous Infusions:  PRN Meds:.acetaminophen, acetaminophen, bisacodyl, HYDROmorphone (DILAUDID) injection, ondansetron (ZOFRAN) IV, ondansetron, traMADol, zolpidem  Antibiotics: Anti-infectives     Start     Dose/Rate Route Frequency Ordered Stop   12/07/11 1334   ritonavir (NORVIR) tablet 100 mg        100 mg Oral 2 times daily with meals 12/07/11 1334     12/07/11 1200   metroNIDAZOLE (FLAGYL) IVPB 500 mg        500 mg 100 mL/hr over 60 Minutes Intravenous 3 times per day 12/07/11 0924     12/05/11 1200   levofloxacin (LEVAQUIN) IVPB 750 mg        750 mg 100 mL/hr over 90 Minutes Intravenous Every 24 hours 12/05/11 1119     12/05/11 0800   Darunavir Ethanolate (PREZISTA) tablet 800 mg        800 mg Oral Daily with breakfast 12/04/11 1805     12/05/11 0100   piperacillin-tazobactam (ZOSYN) IVPB 3.375 g  Status:  Discontinued  3.375 g 12.5 mL/hr over 240 Minutes  Intravenous Every 8 hours 12/04/11 1805 12/07/11 0923   12/04/11 2200   ritonavir (NORVIR) tablet 100 mg  Status:  Discontinued        100 mg Oral 2 times daily 12/04/11 1805 12/07/11 1334   12/04/11 1930   emtricitabine-tenofovir (TRUVADA) 200-300 MG per tablet 1 tablet        1 tablet Oral Daily 12/04/11 1805     12/04/11 1930   etravirine (INTELENCE) tablet 200 mg        200 mg Oral 2 times daily with meals 12/04/11 1805     12/04/11 1630   piperacillin-tazobactam (ZOSYN) IVPB 3.375 g        3.375 g 100 mL/hr over 30 Minutes Intravenous  Once 12/04/11 1623 12/04/11 1713          Assessment/Plan: Active Problems: Abdominal Pain -resolved -unclear etiology-just discharged on 7/3 after extensive evaluation-including CT abd/chest, Ultrasound Abd and HIDA scan. Also had a [facility] consult as well. CT abdomen and Ultrasound-did show-thickened gall bladder with stones-however HIDA scan was negative for acalculous cholecystitis -seen by GI this admission, seen by [facility] in his recent admission -repeat Abd USG 7/9-showing gall bladder polyp  -EGD- 7/8-essentialy notmal -c/w PPI -stop Flagyl today-as no cholecystitis on Abdomen USG  Vomiting -now the major issue-as abdominal pain has resolved -EGD 7/8-negative for major abnormalities -Gastric Emptying Scan on 7/9-pending results -continue with clear liquids and supportive care  PNA -seen better on CXR done 7/6 -continue with Levaquin-started 7/6 -afebrile and No leukocytosis -??could this be causing the vomiting  CHF with exacerbation -this was acute systolic heart failure on admission-this is a new diagnosis for patient -much more compensated by exam today -Has h/o cocaine use till 2 years ago, and strong h/o ETOH use-these could be the potential etiologies here. -c/w Lisinopril and Coreg-slowly titrating dose -slight bump in creatinine-but stable over past few days, continue to hold lasix as he continues to have vomiting - 2 D  Echo-on 7/6-EF 20-25 percent with diffuse hypokinesis -strict I&O -cards planning outpatient coronary CT angiography  ARF -mild, 2/2 to vomiting and Diuretics-has been stable -hold lasix -recheck lytes in am   HIV (human immunodeficiency virus infection) -c/w ART's -CD4 on 09/01/11-500 -outpatient ID follow up  HTN -c/w Coreg to 6.25 mg BID - Lisinopril increased to 20 mg daily -watch creatitine  Pleural Effusion -likely transudative from CHF -monitor  GERD -PPI  Disposition: -remain inpatient  DVT Prophylaxis: -on prophylactic Loveox  Code Status: Full Code  Jeoffrey Massed, MD  [facility] Pager [phone]  If 7PM-7AM, please contact night-coverage [email or web address] Password [email or web address] 12/08/2011, 1:16 PM   LOS: 4 days

## 2011-12-08 NOTE — Progress Notes (Signed)
[facility] Daily Rounding Note 12/08/2011, 10:16 AM  SUBJECTIVE:       No abdominal pain for at least 2 days.  Had nausea when he took HIV meds on nearly empty stomach this AM, no nausea currently.  No BM.  Wondering when he can go home.  Diet not yet advanced beyond clears.  Had GES today, no reading/report yet.   OBJECTIVE:         Vital signs in last 24 hours:    Temp:  [97.3 F (36.3 C)-99 F (37.2 C)] 97.6 F (36.4 C) (07/09 0448) Pulse Rate:  [89-97] 93  (07/09 0448) Resp:  [18-51] 23  (07/09 0448) BP: (119-157)/(80-125) 130/93 mmHg (07/09 0551) SpO2:  [88 %-98 %] 97 % (07/09 0448) Weight:  [153 lb 10.6 oz (69.7 kg)] 153 lb 10.6 oz (69.7 kg) (07/09 0444) Last BM Date: 12/08/11 General: looks well   Heart: RRR Chest: clear, not sob or coughing Abdomen: soft, NT, ND.  No HSM or mass  Extremities: no pedal edema Neuro/Psych:  Not confused.  Baseline of anxiety and a bit histrionic.   Intake/Output from previous day: 07/08 0701 - 07/09 0700 In: 1185 [P.O.:342; IV Piggyback:843] Out: -   Intake/Output this shift:    Lab Results:  Basename 12/08/11 0538 12/06/11 0650  WBC 7.7 8.1  HGB 12.5* 13.4  HCT 37.3* 39.7  PLT 292 293   BMET  Basename 12/08/11 0538 12/07/11 0550 12/06/11 0650  NA 138 142 142  K 3.5 2.8* 3.0*  CL 100 102 102  CO2 21 24 25   GLUCOSE 83 95 90  BUN 18 16 17   CREATININE 1.38* 1.38* 1.38*  CALCIUM 9.1 9.2 9.4   LFT  Basename 12/07/11 0550 12/06/11 0650  PROT 6.7 7.4  ALBUMIN 3.3* 3.4*  AST 64* 86*  ALT 71* 73*  ALKPHOS 88 88  BILITOT 0.6 0.6  BILIDIR 0.3 0.3  IBILI 0.3 0.3   Studies/Results: US Abdomen Complete 12/07/2011  *RADIOLOGY REPORT*  Clinical Data:  Abdominal pain, epigastric pain, nausea and vomiting, history of HIV, hepatitis B, and hepatitis C  COMPLETE ABDOMINAL ULTRASOUND  Comparison:  CT abdomen pelvis of 11/30/2011  Findings:  Gallbladder:  The gallbladder is visualized and there are several non mobile echogenic foci  within the gallbladder with no shadowing, most consistent with small polyps.  No definite gallstones are seen.  No pain is present over gallbladder with compression.  Common bile duct:  The common bile duct is normal measuring 3.5 mm in diameter.  Liver:  The liver has a normal echogenic pattern.  No ductal dilatation is seen.  IVC:  The IVC is unremarkable.  Pancreas:  No focal abnormality seen.  Spleen:  The spleen is normal measuring 5.3 cm sagittally.  Right Kidney:  No hydronephrosis is seen.  The right kidney measures 11.0 cm sagittally.  Left Kidney:  No hydronephrosis is seen.  The left kidney measures 11.0 cm.  A complex cyst is noted in the lower pole of 3.6 x 3.9 x 3.3 cm.  Abdominal aorta:  The abdominal aorta is normal in caliber.  Bilateral pleural effusions are present.  Only a small amount of ascites is noted.  IMPRESSION:  1.  Probable small gallbladder polyps.  No definite gallstones. 2.  No hydronephrosis. 3.  Small bilateral pleural effusions. Small amount of ascites.  Original Report Authenticated By: Juline Patch, M.D.    ASSESMENT: * Epigastric pain, nausea, vomiting.  Normal EGD 12/07/11 *  Thickened GB wall on ultrasound, HIDA scan normal. Transaminases elevated. Slight hepatocellular dysfunction on HIDA 7/2.  * CHF, BNP 11769, down from 16400.  * Hypokalemia.  * AIDS/HIV. Compliant with HAART therapy. CD 4 count is 500. Cardiology planning outpt workup    PLAN: *  From GI standpoint, ok to discharge home today.  We do not necessarily need to have results of GES before discharge and pt is chomping at bit to go home.  What a difference 24 hours make.  Seems to have limited understanding of his disease process. *  Heart healthy diet. *  Can follow up with Dr Luciana Axe.  *  Daily PPI.    LOS: 4 days   Jennye Moccasin  12/08/2011, 10:16 AM Pager: [phone]

## 2011-12-08 NOTE — Progress Notes (Signed)
Pt. Having small red stools with mucous consistency. On call NP notified.

## 2011-12-08 NOTE — Progress Notes (Addendum)
I have taken an interval history, reviewed the chart and examined the patient. I agree with the extender's note, impression and recommendations. GES with 64% retention at 2 hours-this might be a transient condition related to his acute illness but would recommend a low fat, low residue diet along with a daily PPI. He can follow up with Dr. Ronnie Derby. OP GI follow up with Dr. Yancey Flemings if needed. We will sign off.  Venita Lick. Russella Dar MD Clement[facility] Graham

## 2011-12-09 ENCOUNTER — Encounter (HOSPITAL_COMMUNITY): Payer: Self-pay | Admitting: Gastroenterology

## 2011-12-09 DIAGNOSIS — Z21 Asymptomatic human immunodeficiency virus [HIV] infection status: Secondary | ICD-10-CM | POA: Diagnosis not present

## 2011-12-09 DIAGNOSIS — J189 Pneumonia, unspecified organism: Secondary | ICD-10-CM | POA: Diagnosis not present

## 2011-12-09 DIAGNOSIS — I10 Essential (primary) hypertension: Secondary | ICD-10-CM

## 2011-12-09 DIAGNOSIS — R112 Nausea with vomiting, unspecified: Secondary | ICD-10-CM | POA: Diagnosis not present

## 2011-12-09 DIAGNOSIS — I509 Heart failure, unspecified: Secondary | ICD-10-CM | POA: Diagnosis not present

## 2011-12-09 LAB — BASIC METABOLIC PANEL
BUN: 17 mg/dL (ref 6–23)
Calcium: 9.2 mg/dL (ref 8.4–10.5)
Creatinine, Ser: 1.46 mg/dL — ABNORMAL HIGH (ref 0.50–1.35)
GFR calc Af Amer: 64 mL/min — ABNORMAL LOW (ref >90)
GFR calc non Af Amer: 55 mL/min — ABNORMAL LOW (ref >90)
Potassium: 3.3 mEq/L — ABNORMAL LOW (ref 3.5–5.1)

## 2011-12-09 MED ORDER — POLYETHYLENE GLYCOL 3350 17 G PO PACK: 17.0000 g | PACK | Freq: Two times a day (BID) | ORAL | Status: DC | PRN

## 2011-12-09 MED ORDER — SENNA 8.6 MG PO TABS: 2.0000 | ORAL_TABLET | Freq: Every evening | ORAL | Status: DC | PRN

## 2011-12-09 MED ORDER — UNABLE TO FIND: Status: DC

## 2011-12-09 MED ORDER — METOCLOPRAMIDE HCL 5 MG PO TABS: 5.0000 mg | ORAL_TABLET | Freq: Three times a day (TID) | ORAL | Status: DC

## 2011-12-09 MED ORDER — CARVEDILOL 6.25 MG PO TABS: 6.2500 mg | ORAL_TABLET | Freq: Two times a day (BID) | ORAL | Status: DC

## 2011-12-09 MED ORDER — LEVOFLOXACIN 750 MG PO TABS: 750.0000 mg | ORAL_TABLET | Freq: Every day | ORAL | Status: AC

## 2011-12-09 NOTE — Progress Notes (Signed)
Subjective: Feeling better.  Reported that he was ready to go home.  Eating better, reported he was eating solid food yesterday.  Objective: Vital signs in last 24 hours: Filed Vitals:   12/08/11 0551 12/08/11 1455 12/08/11 2038 12/09/11 0659  BP: 130/93 143/112 134/95 147/115  Pulse:  93 89 93  Temp:  97.6 F (36.4 C) 98.8 F (37.1 C) 98.7 F (37.1 C)  TempSrc:  Oral Oral Oral  Resp:  20 18 20   Height:      Weight:      SpO2:  95% 90% 93%   Weight change:   Intake/Output Summary (Last 24 hours) at 12/09/11 0819 Last data filed at 12/09/11 0140  Gross per 24 hour  Intake    300 ml  Output      0 ml  Net    300 ml    Physical Exam: General: Awake, Oriented, No acute distress. HEENT: EOMI. Neck: Supple CV: S1 and S2 Lungs: Clear to ascultation bilaterally Abdomen: Soft, Nontender, Nondistended, +bowel sounds. Ext: Good pulses. Trace edema.  Lab Results: Basic Metabolic Panel:  Lab 12/08/11 8295 12/07/11 0550 12/06/11 0650 12/05/11 0625 12/04/11 1419  NA 138 142 142 144 141  K 3.5 2.8* 3.0* 3.4* 3.5  CL 100 102 102 106 104  CO2 21 24 25 24 20   GLUCOSE 83 95 90 104* 88  BUN 18 16 17 13 10   CREATININE 1.38* 1.38* 1.38* 1.07 0.86  CALCIUM 9.1 9.2 9.4 9.0 9.4  MG -- -- -- -- --  PHOS -- -- -- -- --   Liver Function Tests:  Lab 12/07/11 0550 12/06/11 0650 12/05/11 0625 12/04/11 1419  AST 64* 86* 82* 68*  ALT 71* 73* 57* 44  ALKPHOS 88 88 89 91  BILITOT 0.6 0.6 0.6 0.5  PROT 6.7 7.4 7.0 7.5  ALBUMIN 3.3* 3.4* 3.4* 3.6    Lab 12/04/11 1419  LIPASE 24  AMYLASE --   No results found for this basename: AMMONIA:5 in the last 168 hours CBC:  Lab 12/08/11 0538 12/06/11 0650 12/05/11 0625 12/04/11 1419  WBC 7.7 8.1 7.3 8.1  NEUTROABS -- -- -- --  HGB 12.5* 13.4 11.8* 12.3*  HCT 37.3* 39.7 35.5* 36.6*  MCV 84.4 83.8 83.7 83.4  PLT 292 293 261 260   Cardiac Enzymes: No results found for this basename: CKTOTAL:5,CKMB:5,CKMBINDEX:5,TROPONINI:5 in the last  168 hours BNP (last 3 results)  Basename 12/06/11 0650 12/04/11 1758  PROBNP 11769.0* 16432.0*   CBG: No results found for this basename: GLUCAP:5 in the last 168 hours No results found for this basename: HGBA1C:5 in the last 72 hours Other Labs: No components found with this basename: POCBNP:3 No results found for this basename: DDIMER:2 in the last 168 hours No results found for this basename: CHOL:2,HDL:2,LDLCALC:2,TRIG:2,CHOLHDL:2,LDLDIRECT:2 in the last 168 hours No results found for this basename: TSH,T4TOTAL,FREET3,T3FREE,FREET4,THYROIDAB in the last 168 hours No results found for this basename: VITAMINB12:2,FOLATE:2,FERRITIN:2,TIBC:2,IRON:2,RETICCTPCT:2 in the last 168 hours  Micro Results: Recent Results (from the past 240 hour(s))  URINE CULTURE     Status: Normal   Collection Time   11/30/11 12:23 PM      Component Value Range Status Comment   Specimen Description URINE, CLEAN CATCH   Final    Special Requests ADDED 11/30/11 1423   Final    Culture  Setup Time 12/01/2011 00:37   Final    Colony Count NO GROWTH   Final    Culture NO GROWTH   Final  Report Status 12/01/2011 FINAL   Final   MRSA PCR SCREENING     Status: Normal   Collection Time   11/30/11  8:09 PM      Component Value Range Status Comment   MRSA by PCR NEGATIVE  NEGATIVE Final     Studies/Results: Nm Gastric Emptying  12/08/2011  *RADIOLOGY REPORT*  Clinical Data:  Abdominal pain, nausea.  NUCLEAR MEDICINE GASTRIC EMPTYING SCAN  Technique:  After oral ingestion of radiolabeled meal, sequential abdominal images were obtained for 120 minutes.  Residual percentage of activity remaining within the stomach was calculated at 60 and 120 minutes.  Radiopharmaceutical: 2.0 mCi Tc-47m sulfur colloid.  Comparison: None.  Findings:Left anerior oblique imaging after the patient ingested the radiopharmaceutical in scrambled egg p.o. There is 64% residual gastric activity at 120 minutes (normal less than 30%).  IMPRESSION   Gastric emptying is delayed.  Original Report Authenticated By: Osa Craver, M.D.   US Abdomen Complete  12/07/2011  *RADIOLOGY REPORT*  Clinical Data:  Abdominal pain, epigastric pain, nausea and vomiting, history of HIV, hepatitis B, and hepatitis C  COMPLETE ABDOMINAL ULTRASOUND  Comparison:  CT abdomen pelvis of 11/30/2011  Findings:  Gallbladder:  The gallbladder is visualized and there are several non mobile echogenic foci within the gallbladder with no shadowing, most consistent with small polyps.  No definite gallstones are seen.  No pain is present over gallbladder with compression.  Common bile duct:  The common bile duct is normal measuring 3.5 mm in diameter.  Liver:  The liver has a normal echogenic pattern.  No ductal dilatation is seen.  IVC:  The IVC is unremarkable.  Pancreas:  No focal abnormality seen.  Spleen:  The spleen is normal measuring 5.3 cm sagittally.  Right Kidney:  No hydronephrosis is seen.  The right kidney measures 11.0 cm sagittally.  Left Kidney:  No hydronephrosis is seen.  The left kidney measures 11.0 cm.  A complex cyst is noted in the lower pole of 3.6 x 3.9 x 3.3 cm.  Abdominal aorta:  The abdominal aorta is normal in caliber.  Bilateral pleural effusions are present.  Only a small amount of ascites is noted.  IMPRESSION:  1.  Probable small gallbladder polyps.  No definite gallstones. 2.  No hydronephrosis. 3.  Small bilateral pleural effusions. Small amount of ascites.  Original Report Authenticated By: Juline Patch, M.D.    Medications: I have reviewed the patient's current medications. Scheduled Meds:   . carvedilol  6.25 mg Oral BID WC  . darunavir  800 mg Oral Q breakfast  . emtricitabine-tenofovir  1 tablet Oral Daily  . etravirine  200 mg Oral BID WC  . hydrALAZINE  5 mg Intravenous Once  . levofloxacin (LEVAQUIN) IV  750 mg Intravenous Q24H  . lisinopril  20 mg Oral Daily  . loratadine  10 mg Oral Daily  . metoCLOPramide (REGLAN) injection   5 mg Intravenous TID AC  . pantoprazole  40 mg Oral Q0600  . polyethylene glycol  17 g Oral BID  . ritonavir  100 mg Oral Q breakfast  . senna  2 tablet Oral QHS  . sodium chloride  3 mL Intravenous Q12H  . DISCONTD: metronidazole  500 mg Intravenous Q8H  . DISCONTD: pantoprazole (PROTONIX) IV  40 mg Intravenous Q12H  . DISCONTD: ritonavir  100 mg Oral BID WC   Continuous Infusions:  PRN Meds:.acetaminophen, acetaminophen, bisacodyl, HYDROmorphone (DILAUDID) injection, ondansetron (ZOFRAN) IV, ondansetron, technetium sulfur colloid,  traMADol, zolpidem  Assessment/Plan: Abdominal Pain  Resolved. Unclear etiology-just discharged on 7/3 after extensive evaluation-including CT abd/chest, Ultrasound Abd and HIDA scan. Also had a [facility] consult as well. CT abdomen and Ultrasound-did show-thickened gall bladder with stones-however HIDA scan was negative for acalculous cholecystitis. Seen by GI this admission, seen by [facility] in his recent admission. Repeat Abd USG 7/9-showing gall bladder polyp. EGD- 7/8-essentialy normal. Continue PPI.  Stopped Flagyl on 12/08/2011 cholecystitis on Abdomen USG.  Vomiting  Improved since yesterday. EGD 7/8-negative for major abnormalities. Gastric Emptying Scan on 7/9 showed delayed gastric emptying, likely due to acute illness.  Low-residue diet with low-fat diet.   PNA  Seen better on CXR done 7/6. Continue with Levaquin-started 7/6, define a seven-day course.  Uncertain if related to aspiration from recent vomiting.    CHF with exacerbation  This was acute systolic heart failure on admission-this is a new diagnosis for patient. Improved. Has h/o cocaine use till 2 years ago, and strong h/o ETOH use-these could be the potential etiologies here.  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 Continue Lisinopril and Coreg-slowly titrating dose. Slight bump in creatinine-but stable over past few days, continue to hold lasix as he continues to have vomiting. 2 D  Echo-on 7/6-EF 20-25 percent with diffuse hypokinesis. Strict I&O. Cards planning outpatient coronary CT angiography.  ARF  Mild, 2/2 to vomiting and Diuretics-has been stable. Hold lasix.   HIV (human immunodeficiency virus infection)  Continue HART's. CD4 on 09/01/11-500. Outpatient ID follow up.  HTN  Continue Coreg to 6.25 mg BID. Continue lisinopril increased to 20 mg daily.  Pleural Effusion  Likely transudative from CHF. Monitor.  GERD  PPI   DVT Prophylaxis:  On prophylactic Loveox   Code Status:  Full Code  Disposition Discharge patient today.   LOS: 5 days  [name] A, MD 12/09/2011, 8:19 AM

## 2011-12-09 NOTE — Discharge Summary (Signed)
Physician Discharge Summary  [name] ZOX:[id number] DOB: February 16, 1966 DOA: 12/04/2011  PCP: No primary provider on file.  Admit date: 12/04/2011 Discharge date: 12/09/2011  Recommendations for Outpatient Follow-up:  1. Followup with Cardiology in 2 weeks, ID (Dr. Luciana Axe) in 1 week.  Willing to make a decision about Lasix in 1-2 weeks as outpatient.  May need chest x-ray as outpatient to followup on pleural effusion.  Discharge Diagnoses:  Active Problems:  HYPERTENSION  HIV (human immunodeficiency virus infection)  Abdominal pain  Pleural effusion, bilateral  CHF (congestive heart failure)  Nonspecific (abnormal) findings on radiological and other examination of biliary tract  Nausea with vomiting  Cardiomyopathy  Discharge Condition: Stable  Diet recommendation: Low-residue and low-fat diet.  History of present illness:  [name] is a 46/M with h/o HTN, HIV on HAART, undetectable viral load and CD4 of 500 (4/13), discharged from [facility] hospital on 7/3 after admission for abdominal pain originally suspected to be from Acalculous cholecystitis then underwent a HIDA scan which was unremarkable and was discharged home after surgical evaluation on Protonix BID, after going home he continued to have persistent RUQ and epigastric abdominal pain, he describes the pain as severe, sometimes cramping, non radiating, worsened today after eating ice cream on 12/04/2011.  Hospital Course:  Abdominal Pain  Resolved. Unclear etiology, just discharged on 7/3 after extensive evaluation-including CT abd/chest, Ultrasound Abd and HIDA scan. Also had a [facility] consult as well. CT abdomen and Ultrasound-did show-thickened gall bladder with stones-however HIDA scan was negative for acalculous cholecystitis. Seen by GI this admission, seen by [facility] in his recent admission. Repeat Abd USG 7/9-showing gall bladder polyp. EGD- 7/8-essentialy normal. Continue PPI.  Stopped Flagyl on 12/08/2011 cholecystitis on Abdomen  USG.  Vomiting  Improved during the course of hospital stay. EGD 7/8-negative for major abnormalities. Gastric Emptying Scan on 7/9 showed delayed gastric emptying, likely due to acute illness.  Low-residue diet with low-fat diet.   PNA  Seen better on CXR done 7/6. Continue with Levaquin-started 7/6, define a seven-day course.  Uncertain if related to aspiration from recent vomiting.    CHF with exacerbation  This was acute systolic heart failure on admission-this is a new diagnosis for patient. Improved. Has h/o cocaine use till 2 years ago, and strong h/o ETOH use-these could be the potential etiologies here.  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 Continue Lisinopril and Coreg-slowly titrating dose. Slight bump in creatinine-but stable over past few days, continue to hold lasix as he continues to have vomiting. 2 D Echo-on 7/6-EF 20-25 percent with diffuse hypokinesis. Strict I&O. [facility] cards planning outpatient coronary CT angiography.  ARF  Mild, 2/2 to vomiting and Diuretics-has been stable.  He initially was on Lasix which has been held.  Willing to make a decision about when to resume Lasix as outpatient.  HIV (human immunodeficiency virus infection)  Continue HART's. CD4 on 09/01/11-500. Outpatient ID follow up.  HTN  Continue Coreg to 6.25 mg BID.  Resume lisinopril 40 mg daily at discharge.  Further titration to be than as outpatient.  Pleural Effusion  Likely transudative from CHF. Monitor as outpatient.  GERD  PPI   DVT Prophylaxis:  On prophylactic Loveox   Procedures: EGD on 12/07/2011.  Consultations: Dr. Russella Dar, GI Dr. Antoine Poche, Cardiology  Discharge Exam: Filed Vitals:   12/09/11 0659  BP: 147/115  Pulse: 93  Temp: 98.7 F (37.1 C)  Resp: 20   Filed Vitals:   12/08/11 0551 12/08/11 1455 12/08/11 2038 12/09/11  0659  BP: 130/93 143/112 134/95 147/115  Pulse:  93 89 93  Temp:  97.6 F (36.4 C) 98.8 F (37.1 C) 98.7 F (37.1 C)    TempSrc:  Oral Oral Oral  Resp:  20 18 20   Height:      Weight:      SpO2:  95% 90% 93%    Discharge Instructions  Discharge Orders    Future Orders Please Complete By Expires   Increase activity slowly      Discharge instructions      Comments:   Diet: Low Residue and Low Fat Diet. Followup with Dr. Luciana Axe in 1 week. Followup with [facility] in 1-2 week, discuss with cardiology about when lasix will need to be started. CBC and BMET to be checked at next clinic visit with Dr. Luciana Axe or Cardiology.   Heart Failure patients record your daily weight using the same scale at the same time of day      ACE Inhibitor / ARB already ordered        Medication List  As of 12/09/2011  8:37 AM   STOP taking these medications         hydrochlorothiazide 25 MG tablet         TAKE these medications         carvedilol 6.25 MG tablet   Commonly known as: COREG   Take 1 tablet (6.25 mg total) by mouth 2 (two) times daily with a meal.      darunavir 400 MG tablet   Commonly known as: PREZISTA   Take 800 mg by mouth daily with breakfast.      emtricitabine-tenofovir 200-300 MG per tablet   Commonly known as: TRUVADA   Take 1 tablet by mouth daily.      etravirine 100 MG tablet   Commonly known as: INTELENCE   Take 200 mg by mouth 2 (two) times daily with a meal.      levofloxacin 750 MG tablet   Commonly known as: LEVAQUIN   Take 1 tablet (750 mg total) by mouth daily.      lisinopril 40 MG tablet   Commonly known as: PRINIVIL,ZESTRIL   Take 1 tablet (40 mg total) by mouth daily.      loratadine 10 MG tablet   Commonly known as: CLARITIN   Take 10 mg by mouth daily.      metoCLOPramide 5 MG tablet   Commonly known as: REGLAN   Take 1 tablet (5 mg total) by mouth 3 (three) times daily before meals. For one week the discontinue.      naftifine 1 % cream   Commonly known as: NAFTIN   Apply 1 application topically 2 (two) times daily. For 6 weeks.  Started 10/05/2011       omeprazole 20 MG capsule   Commonly known as: PRILOSEC   Take 1 capsule (20 mg total) by mouth 2 (two) times daily.      polyethylene glycol packet   Commonly known as: MIRALAX / GLYCOLAX   Take 17 g by mouth 2 (two) times daily as needed (Constipation).      ritonavir 100 MG capsule   Commonly known as: NORVIR   Take 100 mg by mouth 2 (two) times daily.      senna 8.6 MG Tabs   Commonly known as: SENOKOT   Take 2 tablets (17.2 mg total) by mouth at bedtime as needed (Constipation).      traMADol 50 MG tablet  Commonly known as: ULTRAM   Take 1 tablet (50 mg total) by mouth every 6 (six) hours as needed for pain.           Follow-up Information    Follow up with [name], MD. Schedule an appointment as soon as possible for a visit in 1 week. (ID clinic)    Contact information:   [address] [place] [address] [phone]       Follow up with Yancey Flemings, MD. Schedule an appointment as soon as possible for a visit in 2 weeks. (As needed. If any abdominal pain, nausea or vomiting.)    Contact information:   [address] [facility] [address] [place] [address] [place] [facility] [place] [address] [phone]       Follow up with Rollene Rotunda, MD. Schedule an appointment as soon as possible for a visit in 2 weeks.   Contact information:   [address], [address] [place] [address] [phone]           The results of significant diagnostics from this hospitalization (including imaging, microbiology, ancillary and laboratory) are listed below for reference.    Significant Diagnostic Studies: Dg Chest 2 View  12/05/2011  *RADIOLOGY REPORT*  Clinical Data: Epigastric abdominal pain.  Old right-sided chest pain.  History of hepatitis and HIV.  Follow up mild interstitial edema and effusions.  CHEST - 2 VIEW  Comparison: Two-view chest x-ray yesterday, 07/26/2006.  Findings: Cardiac silhouette markedly enlarged but  stable.  Hilar and mediastinal contours otherwise unremarkable.  Interval resolution of the mild interstitial pulmonary edema.  Persistent bilateral pleural effusions, right greater than left, and associated consolidation in the right lower lobe and right middle lobe.  No new pulmonary parenchymal abnormalities.  Visualized bony thorax intact.  IMPRESSION:  1.  Resolution of interstitial pulmonary edema. 2.  Persistent small bilateral pleural effusions and consolidation in the right middle and lower lobes, likely reflecting pneumonia.  Original Report Authenticated By: Arnell Sieving, M.D.   Dg Chest 2 View  12/04/2011  **ADDENDUM** CREATED: 12/04/2011 15:34:03  There is a more recent chest radiograph from an acute abdomen series dated 11/30/2011 for comparison.  The currently demonstrated enlarged cardiac silhouette and prominent pulmonary vasculature and interstitial markings were previously present.  The currently demonstrated bilateral pleural fluid and right basilar opacity were not previously present.  **END ADDENDUM** SIGNED BY: Londell Moh. Azucena Kuba, M.D.   12/04/2011  *RADIOLOGY REPORT*  Clinical Data: Chest pain and shortness of breath.  Smoker. Abdominal pain.  Nausea, vomiting and diarrhea.  CHEST - 2 VIEW  Comparison: Previous examinations.  Findings: Enlarged cardiac silhouette, not present on 07/26/2006. Patchy and linear density at the right lung base.  Small bilateral pleural effusions.  Increased prominence of the pulmonary vasculature and interstitial markings.  Unremarkable bones.  IMPRESSION:  1.  Interval cardiomegaly and mild changes of congestive heart failure with bilateral pleural effusions. 2.  Right basilar atelectasis and possible pneumonia.  Original Report Authenticated By: Darrol Angel, M.D.   Ct Angio Chest W/cm &/or Wo Cm  11/30/2011  *RADIOLOGY REPORT*  Clinical Data:  Chest and abdominal pain.  CT ANGIOGRAPHY CHEST CT ABDOMEN AND PELVIS WITH CONTRAST  Technique:  Multidetector CT  imaging of the chest was performed using the standard protocol during bolus administration of intravenous contrast.  Multiplanar CT image reconstructions including MIPs were obtained to evaluate the vascular anatomy. Multidetector CT imaging of the abdomen  and pelvis was performed using the standard protocol during bolus administration of intravenous contrast.  Contrast: OMNIPAQUE IOHEXOL 350 MG/ML SOLN  Comparison:   None.  CTA CHEST  Findings:  The chest wall is unremarkable.  No supraclavicular or axillary lymphadenopathy.  The thyroid gland appears normal.  The bony thorax is intact.  No destructive bony lesions or spinal canal compromise.  The heart is borderline enlarged.  No pericardial effusion.  There are mildly enlarged mediastinal lymph nodes which require follow- up.  The largest right pretracheal node measures 17 mm and there are 216 mm aorticopulmonary window nodes.  The esophagus is grossly normal.  The aorta is normal in caliber.  The pulmonary arterial tree is well opacified.  No filling defects to suggest pulmonary emboli.  Examination of the lung parenchyma demonstrates early emphysematous changes with apical bulla.  There is dependent atelectasis but no infiltrates, edema or effusions.  No worrisome nodules or masses.  The upper abdomen is grossly normal.   Review of the MIP images confirms the above findings.  IMPRESSION:  1.  No CT evidence of pulmonary emboli. 2.  Normal thoracic aorta. 3.  Mild cardiac enlargement. 4.  Mildly enlarged mediastinal lymph nodes.  I would suggest a follow-up chest CT in 4 months to reevaluate.  CT ABDOMEN AND PELVIS  Findings: The liver is unremarkable.  No focal lesions or intrahepatic biliary dilatation. No obvious gallstones.  Possible mild gallbladder wall thickening but no pericholecystic inflammatory changes.  Ultrasound may be helpful for further evaluation.  No common bile duct dilatation.  Some reflux of contrast is noted down the IVC and into the  hepatic veins.  This could be due to tricuspid regurgitation.  The spleen is normal in size.  No focal lesions.  The pancreas is normal.  The adrenal glands and kidneys are normal except for left renal cyst.  The stomach, duodenum, small bowel and colon are unremarkable.  No inflammatory changes or mass lesions.  The appendix is normal.  The aorta is normal in caliber.  The major branch vessels are normal. No mesenteric or retroperitoneal masses are adenopathy.  The bladder, prostate gland and seminal vesicles are unremarkable. No pelvic mass, adenopathy or free pelvic fluid collections.  No inguinal mass or hernia.  The bony structures are unremarkable.  Review of the MIP images confirms the above findings.  IMPRESSION:  1.  Mild gallbladder wall thickening is suspected.  No obvious gallstones or pericholecystic inflammatory change but ultrasound may be helpful for further evaluation if the patient has any right upper quadrant pain. 2.  No other significant abdominal/pelvic findings.  Original Report Authenticated By: P. Loralie Champagne, M.D.   Nm Hepatobiliary  12/01/2011  *RADIOLOGY REPORT*  Clinical Data: Abdominal pain, possible acute cholecystitis  NUCLEAR MEDICINE HEPATOHBILIARY INCLUDE GB  Radiopharmaceutical:  5 mCi Tc-69m mebrofenin  Comparison: None Correlation:  Ultrasound abdomen 11/30/2011, CT abdomen 11/30/2011  Findings: Mild delay in clearance of tracer from bloodstream suggesting mild hepatocellular dysfunction. Prompt excretion of tracer by the liver into the biliary system. Gallbladder is visualized at 10 minutes. Small bowel is visualized at 17 minutes. No evidence of common bile duct or cystic duct obstruction.  IMPRESSION: Patent biliary tree without evidence of acalculous cholecystitis. Somewhat prolonged clearance of tracer from bloodstream suggesting a mild degree of hepatocellular dysfunction.  Original Report Authenticated By: Lollie Marrow, M.D.   Nm Gastric Emptying  12/08/2011   *RADIOLOGY REPORT*  Clinical Data:  Abdominal pain, nausea.  NUCLEAR  MEDICINE GASTRIC EMPTYING SCAN  Technique:  After oral ingestion of radiolabeled meal, sequential abdominal images were obtained for 120 minutes.  Residual percentage of activity remaining within the stomach was calculated at 60 and 120 minutes.  Radiopharmaceutical: 2.0 mCi Tc-46m sulfur colloid.  Comparison: None.  Findings:Left anerior oblique imaging after the patient ingested the radiopharmaceutical in scrambled egg p.o. There is 64% residual gastric activity at 120 minutes (normal less than 30%).  IMPRESSION  Gastric emptying is delayed.  Original Report Authenticated By: Osa Craver, M.D.   US Abdomen Complete  12/07/2011  *RADIOLOGY REPORT*  Clinical Data:  Abdominal pain, epigastric pain, nausea and vomiting, history of HIV, hepatitis B, and hepatitis C  COMPLETE ABDOMINAL ULTRASOUND  Comparison:  CT abdomen pelvis of 11/30/2011  Findings:  Gallbladder:  The gallbladder is visualized and there are several non mobile echogenic foci within the gallbladder with no shadowing, most consistent with small polyps.  No definite gallstones are seen.  No pain is present over gallbladder with compression.  Common bile duct:  The common bile duct is normal measuring 3.5 mm in diameter.  Liver:  The liver has a normal echogenic pattern.  No ductal dilatation is seen.  IVC:  The IVC is unremarkable.  Pancreas:  No focal abnormality seen.  Spleen:  The spleen is normal measuring 5.3 cm sagittally.  Right Kidney:  No hydronephrosis is seen.  The right kidney measures 11.0 cm sagittally.  Left Kidney:  No hydronephrosis is seen.  The left kidney measures 11.0 cm.  A complex cyst is noted in the lower pole of 3.6 x 3.9 x 3.3 cm.  Abdominal aorta:  The abdominal aorta is normal in caliber.  Bilateral pleural effusions are present.  Only a small amount of ascites is noted.  IMPRESSION:  1.  Probable small gallbladder polyps.  No definite  gallstones. 2.  No hydronephrosis. 3.  Small bilateral pleural effusions. Small amount of ascites.  Original Report Authenticated By: Juline Patch, M.D.   US Abdomen Complete  11/30/2011  *RADIOLOGY REPORT*  Clinical Data:  Nausea and vomiting.  COMPLETE ABDOMINAL ULTRASOUND  Comparison:  CT scan, same date.  Findings:  Gallbladder:  No definite gallstones but there is marked gallbladder wall thickening and a small amount of pericholecystic fluid which may suggest acalculus cholecystitis.  Common bile duct:  Normal in caliber measuring a maximum of 3.19mm.  Liver:  The liver is sonographically unremarkable.  There is normal echogenicity without focal lesions or intrahepatic biliary dilatation.  IVC:  Normal caliber.  Pancreas:  Limited visualization by ultrasound but appeared normal on the CT scan.  Spleen:  Normal size and echogenicity without focal lesions.  Right Kidney:  10.1 cm in length. Normal renal cortical thickness and echogenicity without focal lesions or hydronephrosis.  Left Kidney:  12.4 cm in length. Normal renal cortical thickness and echogenicity without focal lesions or hydronephrosis.4.7 x 3.3 x 4.0 cm simple appearing lower pole cyst.  Abdominal aorta:  Normal caliber.  IMPRESSION:  1.  Diffuse and fairly marked gallbladder wall thickening without definite gallstones.  Findings could suggest acalculous cholecystitis. 2.  Normal caliber common bile duct. 3.  No other significant abdominal findings.  Original Report Authenticated By: P. Loralie Champagne, M.D.   Ct Abdomen Pelvis W Contrast  11/30/2011  *RADIOLOGY REPORT*  Clinical Data:  Chest and abdominal pain.  CT ANGIOGRAPHY CHEST CT ABDOMEN AND PELVIS WITH CONTRAST  Technique:  Multidetector CT imaging of the chest was performed  using the standard protocol during bolus administration of intravenous contrast.  Multiplanar CT image reconstructions including MIPs were obtained to evaluate the vascular anatomy. Multidetector CT imaging of the  abdomen and pelvis was performed using the standard protocol during bolus administration of intravenous contrast.  Contrast: OMNIPAQUE IOHEXOL 350 MG/ML SOLN  Comparison:   None.  CTA CHEST  Findings:  The chest wall is unremarkable.  No supraclavicular or axillary lymphadenopathy.  The thyroid gland appears normal.  The bony thorax is intact.  No destructive bony lesions or spinal canal compromise.  The heart is borderline enlarged.  No pericardial effusion.  There are mildly enlarged mediastinal lymph nodes which require follow- up.  The largest right pretracheal node measures 17 mm and there are 216 mm aorticopulmonary window nodes.  The esophagus is grossly normal.  The aorta is normal in caliber.  The pulmonary arterial tree is well opacified.  No filling defects to suggest pulmonary emboli.  Examination of the lung parenchyma demonstrates early emphysematous changes with apical bulla.  There is dependent atelectasis but no infiltrates, edema or effusions.  No worrisome nodules or masses.  The upper abdomen is grossly normal.   Review of the MIP images confirms the above findings.  IMPRESSION:  1.  No CT evidence of pulmonary emboli. 2.  Normal thoracic aorta. 3.  Mild cardiac enlargement. 4.  Mildly enlarged mediastinal lymph nodes.  I would suggest a follow-up chest CT in 4 months to reevaluate.  CT ABDOMEN AND PELVIS  Findings: The liver is unremarkable.  No focal lesions or intrahepatic biliary dilatation. No obvious gallstones.  Possible mild gallbladder wall thickening but no pericholecystic inflammatory changes.  Ultrasound may be helpful for further evaluation.  No common bile duct dilatation.  Some reflux of contrast is noted down the IVC and into the hepatic veins.  This could be due to tricuspid regurgitation.  The spleen is normal in size.  No focal lesions.  The pancreas is normal.  The adrenal glands and kidneys are normal except for left renal cyst.  The stomach, duodenum, small bowel and  colon are unremarkable.  No inflammatory changes or mass lesions.  The appendix is normal.  The aorta is normal in caliber.  The major branch vessels are normal. No mesenteric or retroperitoneal masses are adenopathy.  The bladder, prostate gland and seminal vesicles are unremarkable. No pelvic mass, adenopathy or free pelvic fluid collections.  No inguinal mass or hernia.  The bony structures are unremarkable.  Review of the MIP images confirms the above findings.  IMPRESSION:  1.  Mild gallbladder wall thickening is suspected.  No obvious gallstones or pericholecystic inflammatory change but ultrasound may be helpful for further evaluation if the patient has any right upper quadrant pain. 2.  No other significant abdominal/pelvic findings.  Original Report Authenticated By: P. Loralie Champagne, M.D.   Dg Abd 2 Views  12/04/2011  *RADIOLOGY REPORT*  Clinical Data: Chest pain.  Shortness of breath.  Abdominal pain. Nausea and vomiting.  Smoker.  History of HIV.  ABDOMEN - 2 VIEW  Comparison: Acute abdomen series 11/30/2011.  MRI abdomen 10/24/2008.  Findings: Bowel gas pattern unremarkable without evidence of obstruction or significant ileus.  No evidence of free air or significant air fluid levels on the erect image.  Phleboliths in the pelvis.  No visible opaque urinary tract calculi.  Mild degenerative changes involving the lumbar spine.  IMPRESSION: No acute abdominal abnormality.  Original Report Authenticated By: Arnell Sieving, M.D.  Dg Abd Acute W/chest  11/30/2011  *RADIOLOGY REPORT*  Clinical Data: Shortness of breath.  Abdomen and back pain. Constipation.  ACUTE ABDOMEN SERIES (ABDOMEN 2 VIEW & CHEST 1 VIEW)  Comparison: Chest dated 07/26/2006.  Findings: Interval enlargement of the cardiac silhouette.  Interval small amount of linear density in both mid and lower lung zones with interval minimal prominence of the interstitial markings.  No pleural fluid.  Normal bowel gas pattern without free  peritoneal air.  Normal amount of stool.  Minimal scoliosis.  IMPRESSION:  1.  Interval cardiomegaly, pulmonary vascular congestion and mild interstitial pulmonary edema/chronic interstitial lung disease. 2.  Small amount of linear scarring or atelectasis bilaterally. 3.  No acute abdominal abnormality.  Original Report Authenticated By: Darrol Angel, M.D.    Microbiology: Recent Results (from the past 240 hour(s))  URINE CULTURE     Status: Normal   Collection Time   11/30/11 12:23 PM      Component Value Range Status Comment   Specimen Description URINE, CLEAN CATCH   Final    Special Requests ADDED 11/30/11 1423   Final    Culture  Setup Time 12/01/2011 00:37   Final    Colony Count NO GROWTH   Final    Culture NO GROWTH   Final    Report Status 12/01/2011 FINAL   Final   MRSA PCR SCREENING     Status: Normal   Collection Time   11/30/11  8:09 PM      Component Value Range Status Comment   MRSA by PCR NEGATIVE  NEGATIVE Final      Labs: Basic Metabolic Panel:  Lab 12/09/11 1610 12/08/11 0538 12/07/11 0550 12/06/11 0650 12/05/11 0625  NA 138 138 142 142 144  K 3.3* 3.5 2.8* 3.0* 3.4*  CL 102 100 102 102 106  CO2 20 21 24 25 24   GLUCOSE 94 83 95 90 104*  BUN 17 18 16 17 13   CREATININE 1.46* 1.38* 1.38* 1.38* 1.07  CALCIUM 9.2 9.1 9.2 9.4 9.0  MG -- -- -- -- --  PHOS -- -- -- -- --   Liver Function Tests:  Lab 12/07/11 0550 12/06/11 0650 12/05/11 0625 12/04/11 1419  AST 64* 86* 82* 68*  ALT 71* 73* 57* 44  ALKPHOS 88 88 89 91  BILITOT 0.6 0.6 0.6 0.5  PROT 6.7 7.4 7.0 7.5  ALBUMIN 3.3* 3.4* 3.4* 3.6    Lab 12/04/11 1419  LIPASE 24  AMYLASE --   No results found for this basename: AMMONIA:5 in the last 168 hours CBC:  Lab 12/08/11 0538 12/06/11 0650 12/05/11 0625 12/04/11 1419  WBC 7.7 8.1 7.3 8.1  NEUTROABS -- -- -- --  HGB 12.5* 13.4 11.8* 12.3*  HCT 37.3* 39.7 35.5* 36.6*  MCV 84.4 83.8 83.7 83.4  PLT 292 293 261 260   Cardiac Enzymes: No results found  for this basename: CKTOTAL:5,CKMB:5,CKMBINDEX:5,TROPONINI:5 in the last 168 hours BNP: BNP (last 3 results)  Basename 12/06/11 0650 12/04/11 1758  PROBNP 11769.0* 16432.0*   CBG: No results found for this basename: GLUCAP:5 in the last 168 hours  Time coordinating discharge: 35  Signed:  Gilda Abboud A  [facility] 12/09/2011, 8:35 AM

## 2011-12-12 ENCOUNTER — Emergency Department (HOSPITAL_COMMUNITY)
Admission: EM | Admit: 2011-12-12 | Discharge: 2011-12-13 | Disposition: A | Discharge status: Home / Self Care | Payer: Medicare Other | Attending: Emergency Medicine | Admitting: Emergency Medicine

## 2011-12-12 ENCOUNTER — Encounter (HOSPITAL_COMMUNITY): Payer: Self-pay | Admitting: Emergency Medicine

## 2011-12-12 ENCOUNTER — Emergency Department (HOSPITAL_COMMUNITY): Payer: Medicare Other

## 2011-12-12 DIAGNOSIS — R0600 Dyspnea, unspecified: Secondary | ICD-10-CM

## 2011-12-12 DIAGNOSIS — K5900 Constipation, unspecified: Secondary | ICD-10-CM

## 2011-12-12 DIAGNOSIS — R0602 Shortness of breath: Secondary | ICD-10-CM | POA: Insufficient documentation

## 2011-12-12 DIAGNOSIS — R0682 Tachypnea, not elsewhere classified: Secondary | ICD-10-CM | POA: Insufficient documentation

## 2011-12-12 DIAGNOSIS — R109 Unspecified abdominal pain: Secondary | ICD-10-CM | POA: Diagnosis not present

## 2011-12-12 DIAGNOSIS — R059 Cough, unspecified: Secondary | ICD-10-CM | POA: Diagnosis not present

## 2011-12-12 DIAGNOSIS — J189 Pneumonia, unspecified organism: Secondary | ICD-10-CM | POA: Diagnosis not present

## 2011-12-12 DIAGNOSIS — R05 Cough: Secondary | ICD-10-CM | POA: Insufficient documentation

## 2011-12-12 DIAGNOSIS — I517 Cardiomegaly: Secondary | ICD-10-CM | POA: Diagnosis not present

## 2011-12-12 DIAGNOSIS — Z21 Asymptomatic human immunodeficiency virus [HIV] infection status: Secondary | ICD-10-CM | POA: Insufficient documentation

## 2011-12-12 DIAGNOSIS — R0609 Other forms of dyspnea: Secondary | ICD-10-CM | POA: Insufficient documentation

## 2011-12-12 DIAGNOSIS — R0989 Other specified symptoms and signs involving the circulatory and respiratory systems: Secondary | ICD-10-CM | POA: Insufficient documentation

## 2011-12-12 DIAGNOSIS — Z79899 Other long term (current) drug therapy: Secondary | ICD-10-CM | POA: Diagnosis not present

## 2011-12-12 DIAGNOSIS — I10 Essential (primary) hypertension: Secondary | ICD-10-CM | POA: Diagnosis not present

## 2011-12-12 LAB — COMPREHENSIVE METABOLIC PANEL
Alkaline Phosphatase: 154 U/L — ABNORMAL HIGH (ref 39–117)
BUN: 11 mg/dL (ref 6–23)
Chloride: 105 mEq/L (ref 96–112)
GFR calc Af Amer: >90 mL/min (ref >90)
Glucose, Bld: 108 mg/dL — ABNORMAL HIGH (ref 70–99)
Potassium: 3.6 mEq/L (ref 3.5–5.1)
Total Bilirubin: 0.6 mg/dL (ref 0.3–1.2)
Total Protein: 7.1 g/dL (ref 6.0–8.3)

## 2011-12-12 LAB — CBC WITH DIFFERENTIAL/PLATELET
Eosinophils Absolute: 0.2 10*3/uL (ref 0.0–0.7)
HCT: 37.6 % — ABNORMAL LOW (ref 39.0–52.0)
Hemoglobin: 12.8 g/dL — ABNORMAL LOW (ref 13.0–17.0)
Lymphs Abs: 1.7 10*3/uL (ref 0.7–4.0)
MCH: 28.4 pg (ref 26.0–34.0)
MCV: 83.4 fL (ref 78.0–100.0)
Monocytes Relative: 8 % (ref 3–12)
Neutrophils Relative %: 68 % (ref 43–77)
RBC: 4.51 MIL/uL (ref 4.22–5.81)

## 2011-12-12 LAB — POCT I-STAT TROPONIN I: Troponin i, poc: 0.04 ng/mL (ref 0.00–0.08)

## 2011-12-12 NOTE — ED Notes (Signed)
Patient now complaining of shortness of breath; denies chest pain.  EKG being done in triage.

## 2011-12-12 NOTE — ED Notes (Addendum)
Patient complaining of abdominal pain and constipation for two weeks-- patient states that he was seen on Monday for this; patient reports that he has been taking stool softeners and eating foods high in fiber since Thursday.  Denies any urinary problems.

## 2011-12-13 ENCOUNTER — Emergency Department (HOSPITAL_COMMUNITY): Payer: Medicare Other

## 2011-12-13 DIAGNOSIS — K5900 Constipation, unspecified: Secondary | ICD-10-CM | POA: Diagnosis not present

## 2011-12-13 DIAGNOSIS — R109 Unspecified abdominal pain: Secondary | ICD-10-CM | POA: Diagnosis not present

## 2011-12-13 MED ORDER — FUROSEMIDE 10 MG/ML IJ SOLN
20.0000 mg | Freq: Once | INTRAMUSCULAR | Status: DC
  Filled 2011-12-13: qty 2

## 2011-12-13 MED ORDER — FENTANYL CITRATE 0.05 MG/ML IJ SOLN
25.0000 ug | Freq: Once | INTRAMUSCULAR | Status: AC
  Administered 2011-12-13: 25 ug via INTRAVENOUS
  Filled 2011-12-13: qty 2

## 2011-12-13 MED ORDER — ONDANSETRON HCL 4 MG/2ML IJ SOLN
4.0000 mg | Freq: Once | INTRAMUSCULAR | Status: AC
  Administered 2011-12-13: 4 mg via INTRAVENOUS
  Filled 2011-12-13: qty 2

## 2011-12-13 MED ORDER — SODIUM CHLORIDE 0.9 % IV BOLUS (SEPSIS)
1000.0000 mL | Freq: Once | INTRAVENOUS | Status: AC
  Administered 2011-12-13: 1000 mL via INTRAVENOUS

## 2011-12-13 NOTE — ED Notes (Signed)
Patient transported to X-ray 

## 2011-12-13 NOTE — ED Notes (Signed)
Came into discuss with patient plans for lasix administration and to speak with md about plan of care. Pt really wanted to go home, but agreed to have lasix given. Dr. Dierdre Highman not available at this time and spoke with pt that I am waiting to speak with MD, but will go head and admin Lasix. Pt says he wants to leave and does not want lasix at this time. Pt signed AMA form.

## 2011-12-14 ENCOUNTER — Ambulatory Visit (INDEPENDENT_AMBULATORY_CARE_PROVIDER_SITE_OTHER): Payer: Medicare Other | Admitting: Internal Medicine

## 2011-12-14 ENCOUNTER — Encounter: Payer: Self-pay | Admitting: Internal Medicine

## 2011-12-14 VITALS — BP 158/104 | HR 93 | Temp 98.4°F | Wt 160.0 lb

## 2011-12-14 DIAGNOSIS — Z21 Asymptomatic human immunodeficiency virus [HIV] infection status: Secondary | ICD-10-CM | POA: Diagnosis not present

## 2011-12-14 DIAGNOSIS — I509 Heart failure, unspecified: Secondary | ICD-10-CM | POA: Diagnosis not present

## 2011-12-14 DIAGNOSIS — B20 Human immunodeficiency virus [HIV] disease: Secondary | ICD-10-CM

## 2011-12-14 MED ORDER — FUROSEMIDE 20 MG PO TABS: 20.0000 mg | ORAL_TABLET | Freq: Two times a day (BID) | ORAL | Status: DC

## 2011-12-14 MED ORDER — POTASSIUM CHLORIDE ER 10 MEQ PO TBCR: 10.0000 meq | EXTENDED_RELEASE_TABLET | Freq: Every day | ORAL | Status: DC

## 2011-12-14 NOTE — ED Provider Notes (Signed)
History     CSN: [id number]  Arrival date & time 12/12/11  2148   First MD Initiated Contact with Patient 12/12/11 2347      Chief Complaint  Patient presents with  . Abdominal Pain    (Consider location/radiation/quality/duration/timing/severity/associated sxs/prior treatment) Patient is a 46 y.o. male presenting with abdominal pain.  Abdominal Pain The primary symptoms of the illness include abdominal pain. The primary symptoms of the illness do not include fever or dysuria.  Additional symptoms associated with the illness include constipation. Symptoms associated with the illness do not include chills or back pain.   History provided by patient. Having abdominal pain and constipation persistent since recent discharge from the hospital.has been using stool softeners without relief. Last bowel movement was 5 days ago. No fevers or chills. Has had some ongoing shortness of breath and cough with previous diagnosis of pneumonia. Feels like this is overall getting better.No chest pain. No leg pain or leg swelling. No blood in stools. Patient states he feels like he can't eat anything due to constipation denies any vomiting.symptoms moderate in severity.no known aggravating or alleviating factors.patient also reports recent diagnosis of CHF which is causing him a great deal of anxiety. Past Medical History  Diagnosis Date  . Hypertension   . HIV infection   . Hepatitis     Hep B and Hep C (patient does not report this but these are listed in previous notes.)  . G6PD deficiency     Past Surgical History  Procedure Date  . Finger surgery     Thumb laceration.    . Esophagogastroduodenoscopy 12/07/2011    Procedure: ESOPHAGOGASTRODUODENOSCOPY (EGD);  Surgeon: [name], MD,FACG;  Location: [facility] ENDOSCOPY;  Service: Endoscopy;  Laterality: N/A;    Family History  Problem Relation Age of Onset  . Cancer Mother     History  Substance Use Topics  . Smoking status: Never Smoker     . Smokeless tobacco: Never Used  . Alcohol Use: 3.5 oz/week    7 drink(s) per week     socially      Review of Systems  Constitutional: Negative for fever and chills.  HENT: Negative for neck pain and neck stiffness.   Eyes: Negative for pain.  Respiratory: Negative for chest tightness and wheezing.   Cardiovascular: Negative for chest pain.  Gastrointestinal: Positive for abdominal pain and constipation.  Genitourinary: Negative for dysuria.  Musculoskeletal: Negative for back pain.  Skin: Negative for rash.  Neurological: Negative for headaches.  All other systems reviewed and are negative.    Allergies  Bactrim  Home Medications   Current Outpatient Rx  Name Route Sig Dispense Refill  . CARVEDILOL 6.25 MG PO TABS Oral Take 6.25 mg by mouth 2 (two) times daily with a meal.    . DARUNAVIR ETHANOLATE 400 MG PO TABS Oral Take 800 mg by mouth daily with breakfast.    . EMTRICITABINE-TENOFOVIR 200-300 MG PO TABS Oral Take 1 tablet by mouth daily.    Marland Kitchen ETRAVIRINE 100 MG PO TABS Oral Take 200 mg by mouth 2 (two) times daily with a meal.    . LISINOPRIL 40 MG PO TABS Oral Take 40 mg by mouth daily.    Marland Kitchen LORATADINE 10 MG PO TABS Oral Take 10 mg by mouth daily.     Marland Kitchen METOCLOPRAMIDE HCL 5 MG PO TABS Oral Take 5 mg by mouth 3 (three) times daily before meals. For one week the discontinue.    Marland Kitchen NAFTIFINE  HCL 1 % EX CREA Topical Apply 1 application topically 2 (two) times daily. For 6 weeks.  Started 10/05/2011    . OMEPRAZOLE 20 MG PO CPDR Oral Take 20 mg by mouth 2 (two) times daily.    Marland Kitchen RITONAVIR 100 MG PO CAPS Oral Take 100 mg by mouth 2 (two) times daily.    . SENNA 8.6 MG PO TABS Oral Take 2 tablets by mouth at bedtime as needed. For constipation      BP 137/106  Pulse 90  Temp 99.4 F (37.4 C) (Rectal)  Resp 23  SpO2 100%  Physical Exam  Constitutional: He is oriented to person, place, and time. He appears well-developed and well-nourished.  HENT:  Head: Normocephalic  and atraumatic.       Mildly dry mucous membranes  Eyes: Conjunctivae and EOM are normal. Pupils are equal, round, and reactive to light.  Neck: Trachea normal. Neck supple. No thyromegaly present.  Cardiovascular: Normal rate, regular rhythm, S1 normal, S2 normal and normal pulses.     No systolic murmur is present   No diastolic murmur is present  Pulses:      Radial pulses are 2+ on the right side, and 2+ on the left side.  Pulmonary/Chest: Effort normal and breath sounds normal. He has no wheezes. He has no rhonchi. He has no rales. He exhibits no tenderness.       Mild tachypnea present without respiratory distress  Abdominal: Soft. Normal appearance and bowel sounds are normal. He exhibits no mass. There is no tenderness. There is no rebound, no guarding, no CVA tenderness and negative Murphy's sign.  Musculoskeletal:       BLE:s Calves nontender, no cords or erythema, negative Homans sign  Neurological: He is alert and oriented to person, place, and time. He has normal strength. No cranial nerve deficit or sensory deficit. GCS eye subscore is 4. GCS verbal subscore is 5. GCS motor subscore is 6.  Skin: Skin is warm and dry. No rash noted. He is not diaphoretic.  Psychiatric: His speech is normal.       Cooperative and appropriate    ED Course  Procedures (including critical care time)  Results for orders placed during the hospital encounter of 12/12/11  CBC WITH DIFFERENTIAL      Component Value Range   WBC 8.0  4.0 - 10.5 K/uL   RBC 4.51  4.22 - 5.81 MIL/uL   Hemoglobin 12.8 (*) 13.0 - 17.0 g/dL   HCT 08.6 (*) 57.8 - 46.9 %   MCV 83.4  78.0 - 100.0 fL   MCH 28.4  26.0 - 34.0 pg   MCHC 34.0  30.0 - 36.0 g/dL   RDW 62.9  52.8 - 41.3 %   Platelets 285  150 - 400 K/uL   Neutrophils Relative 68  43 - 77 %   Neutro Abs 5.4  1.7 - 7.7 K/uL   Lymphocytes Relative 21  12 - 46 %   Lymphs Abs 1.7  0.7 - 4.0 K/uL   Monocytes Relative 8  3 - 12 %   Monocytes Absolute 0.7  0.1 -  1.0 K/uL   Eosinophils Relative 2  0 - 5 %   Eosinophils Absolute 0.2  0.0 - 0.7 K/uL   Basophils Relative 0  0 - 1 %   Basophils Absolute 0.0  0.0 - 0.1 K/uL  COMPREHENSIVE METABOLIC PANEL      Component Value Range   Sodium 140  135 - 145  mEq/L   Potassium 3.6  3.5 - 5.1 mEq/L   Chloride 105  96 - 112 mEq/L   CO2 23  19 - 32 mEq/L   Glucose, Bld 108 (*) 70 - 99 mg/dL   BUN 11  6 - 23 mg/dL   Creatinine, Ser 1.61  0.50 - 1.35 mg/dL   Calcium 8.9  8.4 - 09.6 mg/dL   Total Protein 7.1  6.0 - 8.3 g/dL   Albumin 3.3 (*) 3.5 - 5.2 g/dL   AST 74 (*) 0 - 37 U/L   ALT 96 (*) 0 - 53 U/L   Alkaline Phosphatase 154 (*) 39 - 117 U/L   Total Bilirubin 0.6  0.3 - 1.2 mg/dL   GFR calc non Af Amer >90  >90 mL/min   GFR calc Af Amer >90  >90 mL/min  POCT I-STAT TROPONIN I      Component Value Range   Troponin i, poc 0.04  0.00 - 0.08 ng/mL   Comment 3           PRO B NATRIURETIC PEPTIDE      Component Value Range   Pro B Natriuretic peptide (BNP) 5544.0 (*) 0 - 125 pg/mL  POCT I-STAT TROPONIN I      Component Value Range   Troponin i, poc 0.05  0.00 - 0.08 ng/mL   Comment 3            Dg Chest 2 View  12/12/2011  *RADIOLOGY REPORT*  Clinical Data: Abdominal pain.  Recent pneumonia.  CHEST - 2 VIEW  Comparison: 12/05/2011.  Findings: Improvement although incomplete clearance of parenchymal changes right middle lobe.  This can be assessed on follow-up. What remains may represent residua of partially treated infection and atelectasis.  Cardiomegaly.  Central pulmonary vascular prominence.  Mildly tortuous aorta.  IMPRESSION: Interval partial clearing the right middle lobe process as noted above.  Cardiomegaly.  Original Report Authenticated By: Fuller Canada, M.D.     Dg Abd 1 View  12/13/2011  *RADIOLOGY REPORT*  Clinical Data: History constipation.  Abdominal pain.  ABDOMEN - 1 VIEW  Comparison: None.  Findings: Nonspecific bowel gas pattern without plain film evidence bowel obstruction. The  possibility of free intraperitoneal air cannot be addressed on a supine view.  IMPRESSION: Nonspecific bowel gas pattern without plain film evidence bowel obstruction.  No excessive amount of stool detected.  Original Report Authenticated By: Fuller Canada, M.D.    Patient had a large bowel movement while emergency department - constipation improved.  Workup initiated as above. For shortness of breath and recent diagnosis of heart failure, IV Lasix was initiated. after order placed, patient left AMA prior to my reevaluation.  Diagnosis abdominal pain left AMA   MDM   Nursing notes reviewed. Vital signs reviewed. Old records reviewed.        Sunnie Nielsen, MD 12/14/11 [phone]

## 2011-12-16 ENCOUNTER — Other Ambulatory Visit: Payer: Medicare Other

## 2011-12-16 DIAGNOSIS — B20 Human immunodeficiency virus [HIV] disease: Secondary | ICD-10-CM

## 2011-12-16 LAB — CBC WITH DIFFERENTIAL/PLATELET
Basophils Relative: 0 % (ref 0–1)
Eosinophils Absolute: 0.1 10*3/uL (ref 0.0–0.7)
HCT: 39 % (ref 39.0–52.0)
Hemoglobin: 13 g/dL (ref 13.0–17.0)
MCH: 27.7 pg (ref 26.0–34.0)
MCHC: 33.3 g/dL (ref 30.0–36.0)
MCV: 83.2 fL (ref 78.0–100.0)
Monocytes Absolute: 0.8 10*3/uL (ref 0.1–1.0)
Monocytes Relative: 13 % — ABNORMAL HIGH (ref 3–12)

## 2011-12-17 ENCOUNTER — Ambulatory Visit (INDEPENDENT_AMBULATORY_CARE_PROVIDER_SITE_OTHER): Payer: Medicare Other | Admitting: Internal Medicine

## 2011-12-17 ENCOUNTER — Encounter: Payer: Self-pay | Admitting: Internal Medicine

## 2011-12-17 ENCOUNTER — Encounter: Payer: Self-pay | Admitting: *Deleted

## 2011-12-17 VITALS — BP 116/73 | HR 79 | Temp 98.5°F | Wt 147.0 lb

## 2011-12-17 DIAGNOSIS — I509 Heart failure, unspecified: Secondary | ICD-10-CM

## 2011-12-17 DIAGNOSIS — Z21 Asymptomatic human immunodeficiency virus [HIV] infection status: Secondary | ICD-10-CM

## 2011-12-17 DIAGNOSIS — B20 Human immunodeficiency virus [HIV] disease: Secondary | ICD-10-CM

## 2011-12-17 LAB — COMPREHENSIVE METABOLIC PANEL
Albumin: 3.5 g/dL (ref 3.5–5.2)
Alkaline Phosphatase: 141 U/L — ABNORMAL HIGH (ref 39–117)
BUN: 10 mg/dL (ref 6–23)
Glucose, Bld: 97 mg/dL (ref 70–99)
Total Bilirubin: 1 mg/dL (ref 0.3–1.2)

## 2011-12-17 LAB — T-HELPER CELL (CD4) - (RCID CLINIC ONLY): CD4 T Cell Abs: 230 uL — ABNORMAL LOW (ref 400–2700)

## 2011-12-17 NOTE — Progress Notes (Signed)
HIV CLINIC VISIT  RFV: chf follow up Subjective:    Patient ID: [name], male    DOB: July 25, 1965, 46 y.o.   MRN: [id number]  HPI Mr .Mossberg is a 46yo Male with HIV, CD 4 count of 239(21%)/VL pending, previously undetectable on TRV/ETR/DRV/r. He was last seen in our clinic on 7/15. At that time, he was recently discharged from the hospital with diagnoses of new onset heart failure, started on carvedilol and lisinopril but not diuretics. At his clinic appt, he was roughly 15-20 above his baseline, he was started on lasix 20mg  BID for the last 3 days, and feels much relieved. Now back to his baseline weight of 147-150s.   He has questions to how his heart failure developed. I mentioned that it could be due to HIV, or alcohol use. He states that he hasn't had any further beers since being ill. He is doing well, but started to drink odoul's non-alcoholic beer for the taste.   He feels significantly better, no DOE, able to lie flat, no LE edema, less cough. Current Outpatient Prescriptions on File Prior to Visit  Medication Sig Dispense Refill  . carvedilol (COREG) 6.25 MG tablet Take 6.25 mg by mouth 2 (two) times daily with a meal.      . darunavir (PREZISTA) 400 MG tablet Take 800 mg by mouth daily with breakfast.      . emtricitabine-tenofovir (TRUVADA) 200-300 MG per tablet Take 1 tablet by mouth daily.      Marland Kitchen etravirine (INTELENCE) 100 MG tablet Take 200 mg by mouth 2 (two) times daily with a meal.      . furosemide (LASIX) 20 MG tablet Take 1 tablet (20 mg total) by mouth 2 (two) times daily.  60 tablet  5  . lisinopril (PRINIVIL,ZESTRIL) 40 MG tablet Take 40 mg by mouth daily.      Marland Kitchen loratadine (CLARITIN) 10 MG tablet Take 10 mg by mouth daily.       . naftifine (NAFTIN) 1 % cream Apply 1 application topically 2 (two) times daily. For 6 weeks.  Started 10/05/2011      . omeprazole (PRILOSEC) 20 MG capsule Take 20 mg by mouth 2 (two) times daily.      . potassium chloride (K-DUR) 10 MEQ  tablet Take 1 tablet (10 mEq total) by mouth daily.  90 tablet  1  . ritonavir (NORVIR) 100 MG capsule Take 100 mg by mouth 2 (two) times daily.      Marland Kitchen senna (SENOKOT) 8.6 MG TABS Take 2 tablets by mouth at bedtime as needed. For constipation      . metoCLOPramide (REGLAN) 5 MG tablet Take 5 mg by mouth 3 (three) times daily before meals. For one week the discontinue.        Review of Systems Per hpi    Objective:   Physical Exam BP 116/73  Pulse 79  Temp 98.5 F (36.9 C) (Oral)  Wt 147 lb (66.679 kg) Physical Exam  Constitutional: He is oriented to person, place, and time. He appears well-developed and well-nourished. No distress.  HENT:  Mouth/Throat: Oropharynx is clear and moist. No oropharyngeal exudate.  Cardiovascular: Normal rate, regular rhythm and normal heart sounds. Exam reveals no gallop and no friction rub.  No murmur heard.  Pulmonary/Chest: Effort normal and breath sounds normal. No respiratory distress. He has no wheezes.  Abdominal: Soft. Bowel sounds are normal. He exhibits no distension. There is no tenderness.  Lymphadenopathy:  He has no cervical  adenopathy.  Neurological: He is alert and oriented to person, place, and time.  Skin: Skin is warm and dry. No rash noted. No erythema.  Psychiatric: He has a normal mood and affect. His behavior is normal.       Assessment & Plan:   CHF = patient has diuresed #13 in 3 days, now back to baseline weight. Will ask him to decrease lasix to 20mg  daily and stop taking potassium supplementation. Patient is to continue with carvedilol 6.25mg  BID and lisinopril 40mg  daily.  He is to follow up with cardiology on the 22nd for further mgmt. And repeat echo to see if EF still suppressed to point he would need defibrillator  Alcohol use = commend him that he has not restarted drinking alcohol. He states that he only drinks beer for the taste. Thus, odoul's will be his drink of choice for now.  hiv = continue on his current  regimen.   Will have him come back on the 22nd to check his BMP to see if needs potassium replacement.  rtc as previously scheduled

## 2011-12-21 ENCOUNTER — Telehealth: Payer: Self-pay

## 2011-12-21 ENCOUNTER — Encounter: Payer: Self-pay | Admitting: Physician Assistant

## 2011-12-21 ENCOUNTER — Ambulatory Visit (INDEPENDENT_AMBULATORY_CARE_PROVIDER_SITE_OTHER): Payer: Medicare Other | Admitting: Physician Assistant

## 2011-12-21 VITALS — BP 83/61 | HR 77 | Ht 64.5 in | Wt 142.0 lb

## 2011-12-21 DIAGNOSIS — I10 Essential (primary) hypertension: Secondary | ICD-10-CM

## 2011-12-21 DIAGNOSIS — I509 Heart failure, unspecified: Secondary | ICD-10-CM

## 2011-12-21 DIAGNOSIS — I428 Other cardiomyopathies: Secondary | ICD-10-CM | POA: Diagnosis not present

## 2011-12-21 DIAGNOSIS — I429 Cardiomyopathy, unspecified: Secondary | ICD-10-CM

## 2011-12-21 LAB — HIV-1 RNA QUANT-NO REFLEX-BLD
HIV 1 RNA Quant: <20 copies/mL (ref <20)
HIV-1 RNA Quant, Log: <1.3 {Log} (ref <1.30)

## 2011-12-21 LAB — BASIC METABOLIC PANEL
BUN: 18 mg/dL (ref 6–23)
Calcium: 9.6 mg/dL (ref 8.4–10.5)
GFR: 87.99 mL/min (ref >60.00)
Potassium: 4 mEq/L (ref 3.5–5.1)
Sodium: 140 mEq/L (ref 135–145)

## 2011-12-21 MED ORDER — CLONIDINE HCL 0.1 MG PO TABS: 0.1000 mg | ORAL_TABLET | Freq: Two times a day (BID) | ORAL | Status: DC

## 2011-12-21 MED ORDER — CARVEDILOL 6.25 MG PO TABS: 6.2500 mg | ORAL_TABLET | Freq: Two times a day (BID) | ORAL | Status: DC

## 2011-12-21 MED ORDER — FUROSEMIDE 20 MG PO TABS: ORAL_TABLET | ORAL | Status: DC

## 2011-12-21 NOTE — Telephone Encounter (Signed)
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

## 2011-12-21 NOTE — Progress Notes (Addendum)
[address] [place], [place]  [address] Phone: [phone] Fax:  [phone]  Date:  12/21/2011   Name:  [name]   DOB:  12-Dec-1965   MRN:  [id number]  PCP:  No primary provider on file.  Primary Cardiologist:  Dr. Rollene Rotunda  Primary Electrophysiologist:  None    History of Present Illness: [name] is a 46 y.o. male who returns for post hospital follow up.  He has a h/o HIV, HTN, G6PD deficiency, HBV, HCV.  He was admitted x 2 in July.  First admission for abdominal pain thought to be from acalculous cholecystitis.  HIDA was neg.  Surgery was not indicated.  Admitted again 7/5-7/10.  He returned with worsening abdominal pain.  During his workup, he was felt to be in CHF.  Echo confirmed new onset systolic failure.  He was seen by Dr. Rollene Rotunda.  He was diuresed but lasix held at d/c due to worsening creatinine.  He was d/c on carvedilol and lisinopril.  Patient returned to his PCP last week with weight gain and continued abdominal pain.  Lasix was restarted with significant weight loss (18 lbs) and improved symptoms.  Dr. Antoine Poche noted that patient could be evaluated with cardiac CTA to assess for ischemic heart disease as an outpatient.   Echo 12/05/11:  EF 20-25%, diff HK with mild sparing of IL wall, mild AI, mod MR, mild LAE, mild RVE, mild reduced RVF.  Today, we spent a great deal of time going over his medications.  Hospital lists indicate he should be on coreg 6.25 mg bid.  He seems to be describing clonidine and HCTZ.  These are not on his lists from the hospital, but he insists he is taking.  After a great deal of time reviewing this, I asked him to review his medications at home and call us today to reconcile.  He feels much better on Lasix.  No chest pain, dyspnea, orthopnea, PND, edema, syncope.    Wt Readings from Last 3 Encounters:  12/21/11 142 lb (64.411 kg)  12/17/11 147 lb (66.679 kg)  12/14/11 160 lb (72.576 kg)     Potassium  Date/Time Value Range Status  12/16/2011  2:14 PM 3.7  3.5 - 5.3 mEq/L Final     Creat  Date/Time Value Range Status  12/16/2011  2:14 PM 0.98  0.50 - 1.35 mg/dL Final    Past Medical History  Diagnosis Date  . Hypertension   . HIV infection   . Hepatitis     Hep B and Hep C (patient does not report this but these are listed in previous notes.)  . G6PD deficiency   . Chronic systolic heart failure     Echo 12/05/11:  EF 20-25%, diff HK with mild sparing of IL wall, mild AI, mod MR, mild LAE, mild RVE, mild reduced RVF.    Current Outpatient Prescriptions  Medication Sig Dispense Refill  . darunavir (PREZISTA) 400 MG tablet Take 800 mg by mouth daily with breakfast.      . emtricitabine-tenofovir (TRUVADA) 200-300 MG per tablet Take 1 tablet by mouth daily.      Marland Kitchen etravirine (INTELENCE) 100 MG tablet Take 200 mg by mouth 2 (two) times daily with a meal.      . furosemide (LASIX) 20 MG tablet Take 1 tablet (20 mg total) by mouth 2 (two) times daily.  60 tablet  5  . lisinopril (PRINIVIL,ZESTRIL) 40 MG tablet Take 40 mg  by mouth daily.      . ritonavir (NORVIR) 100 MG capsule Take 100 mg by mouth 2 (two) times daily.        Allergies: Allergies  Allergen Reactions  . Bactrim Rash    History  Substance Use Topics  . Smoking status: Never Smoker   . Smokeless tobacco: Never Used  . Alcohol Use: 3.5 oz/week    7 drink(s) per week     socially     ROS:  Please see the history of present illness.     All other systems reviewed and negative.   PHYSICAL EXAM: VS:  BP 83/61  Pulse 77  Ht 5' 4.5" (1.638 m)  Wt 142 lb (64.411 kg)  BMI 24.00 kg/m2 Well nourished, well developed, in no acute distress HEENT: normal Neck: no JVD Cardiac:  normal S1, S2; RRR; no murmur Lungs:  clear to auscultation bilaterally, no wheezing, rhonchi or rales Abd: soft, nontender, no hepatomegaly Ext: no edema Skin: warm and dry Neuro:  CNs 2-12 intact, no focal abnormalities  noted  EKG:  Sinus rhythm, heart rate 64, normal axis, biatrial enlargement, poor R-wave progression, LVH, diffuse T wave inversions in 1, aVL, V2-V5      ASSESSMENT AND PLAN:  1.  Chronic systolic CHF Volume is stable on his current regimen.  As noted above, I spent a great deal of time trying to figure out what medications he was exactly taking.  He will contact us when he gets home to apprise [place] of what medications he is actually taking at home.  He is hypotensive today.  He is asymptomatic.  He will likely need blood pressure medications adjusted once I know exactly what he is taking.  He will need further evaluation to assess for ischemic versus nonischemic myopathy.  I will bring him back in followup with me in 2 weeks.  At that point, we can arrange cardiac CT angiogram.  Followup basic metabolic panel today.  2.  Hypertension BP running low as noted. Await to here from the patient and then adjust current medications as needed.  3.  Alcohol abuse Patient has abstained since d/c from the hospital.  4.  HIV Managed by ID.  5.  Abdominal Pain Resolved.    Luna Glasgow, PA-C  9:53 AM 12/21/2011

## 2011-12-21 NOTE — Patient Instructions (Addendum)
Your physician recommends that you have blood work done today.  BMET  Please call our office and review the medication labels on your bottles so that we can be sure we are accurate about what you are taking.    Your physician recommends that you schedule a follow-up appointment in: 2 weeks with Tereso Newcomer on a day the Dr. Antoine Poche is in the office.    Your physician recommends that you schedule a follow-up appointment in: 6-8 weeks with Dr. Antoine Poche.

## 2011-12-22 ENCOUNTER — Telehealth: Payer: Self-pay | Admitting: *Deleted

## 2011-12-22 ENCOUNTER — Other Ambulatory Visit: Payer: Self-pay

## 2011-12-22 DIAGNOSIS — R55 Syncope and collapse: Secondary | ICD-10-CM

## 2011-12-22 NOTE — Telephone Encounter (Signed)
pt notified labs ok, pt then proceeded to curse @ me and said that he keeps being told labs are fine. states he passed out yesterday after PA appt. see message from Anabel Halon, RN. Per Bing Neighbors. PAC see if we can 48 hour monitor put on pt today. I will send message to [facility] to see if this is do-able.

## 2011-12-22 NOTE — Telephone Encounter (Signed)
Agree. Tereso Newcomer, PA-C  1:27 PM 12/22/2011

## 2011-12-22 NOTE — Telephone Encounter (Signed)
Message copied by Tarri Fuller on Tue Dec 22, 2011  9:31 AM ------      Message from: [place], [place] T      Created: Mon Dec 21, 2011  5:14 PM       Please notify patient that the lab results are ok.      Tereso Newcomer, PA-C  5:14 PM 12/21/2011

## 2011-12-23 ENCOUNTER — Other Ambulatory Visit: Payer: Self-pay | Admitting: *Deleted

## 2011-12-23 DIAGNOSIS — B20 Human immunodeficiency virus [HIV] disease: Secondary | ICD-10-CM

## 2011-12-23 MED ORDER — ETRAVIRINE 100 MG PO TABS: 200.0000 mg | ORAL_TABLET | Freq: Two times a day (BID) | ORAL | Status: DC

## 2011-12-23 MED ORDER — RITONAVIR 100 MG PO CAPS: 100.0000 mg | ORAL_CAPSULE | Freq: Two times a day (BID) | ORAL | Status: DC

## 2011-12-23 MED ORDER — EMTRICITABINE-TENOFOVIR DF 200-300 MG PO TABS: 1.0000 | ORAL_TABLET | Freq: Every day | ORAL | Status: DC

## 2011-12-23 MED ORDER — DARUNAVIR ETHANOLATE 800 MG PO TABS: 800.0000 mg | ORAL_TABLET | Freq: Every day | ORAL | Status: DC

## 2011-12-23 NOTE — Telephone Encounter (Signed)
Pt needs refills on all HIV rxes.

## 2011-12-28 NOTE — Progress Notes (Signed)
HIV CLINIC NOTE RFV: routine visit, hospital follow up for new heart failure Subjective:    Patient ID: [name], male    DOB: 05-Jan-1966, 46 y.o.   MRN: [id number]  HPI Mr. Edgell is a 46yo Male with HIV, CD 4 count of 500 (12%)/ VL <20, currently on darunavir/rit/etravirine. He was admitted initially for abdominal pain, shortness of breath. Found to have acalculous cholecystitis but also found to have congestive heart failure with low EF. He was started on a heart failure regimen including lisinopril and carvedilol but did not have any diuretics. He presents to ID clinic mentions that he has on going symptoms of shortness of breath when walking, +/- orthopnea, nad lower extremity swelling Current Outpatient Prescriptions on File Prior to Visit  Medication Sig Dispense Refill  . lisinopril (PRINIVIL,ZESTRIL) 40 MG tablet Take 40 mg by mouth daily.      . darunavir (PREZISTA) 800 MG tablet Take 1 tablet (800 mg total) by mouth daily with breakfast.  30 tablet  6  . emtricitabine-tenofovir (TRUVADA) 200-300 MG per tablet Take 1 tablet by mouth daily.  30 tablet  6    Active Ambulatory Problems    Diagnosis Date Noted  . HIV DISEASE 06/09/2006  . GENITAL HERPES 06/09/2006  . HEPATITIS C 06/09/2006  . SUBSTANCE ABUSE, MULTIPLE 06/09/2006  . HYPERTENSION 06/09/2006  . Personal History of Other Infectious and Parasitic Disease 06/09/2006  . HIV (human immunodeficiency virus infection) 11/30/2011  . Alcohol abuse 11/30/2011  . Marijuana abuse 11/30/2011  . Abdominal pain 12/04/2011  . Pleural effusion, bilateral 12/04/2011  . CHF (congestive heart failure) 12/04/2011  . Nonspecific (abnormal) findings on radiological and other examination of biliary tract 12/05/2011  . Nausea with vomiting 12/05/2011  . Cardiomyopathy 12/07/2011   Resolved Ambulatory Problems    Diagnosis Date Noted  . ONYCHOMYCOSIS 02/20/2009  . PNEUMOCYSTIS PNEUMONIA 06/09/2006  . DEPRESSION, ACUTE 02/20/2009    . PHARYNGITIS 11/07/2007  . Acute viral bronchitis 06/29/2008  . ALLERGIC RHINITIS CAUSE UNSPECIFIED 09/21/2007  . GERD 05/04/2008  . RECTAL BLEEDING 06/24/2006  . MELENA 11/11/2007  . OTHER SPECIFIED CONGENITAL ANOMALIES OF KIDNEY 10/19/2008  . SKIN RASH 04/23/2010  . ABDOMINAL PAIN OTHER SPECIFIED SITE 09/21/2007  . Headache 06/10/2010  . Oral candidiasis 10/24/2010  . Tonsillitis 10/24/2010  . SIRS (systemic inflammatory response syndrome) 11/30/2011  . Acute acalculous cholecystitis 11/30/2011  . Epigastric abdominal pain 11/30/2011  . RUQ pain 11/30/2011  . Chest pain 11/30/2011  . Acute respiratory failure with hypoxia 11/30/2011  . Hypertensive urgency 11/30/2011  . Dehydration 12/01/2011  . Abnormal urinalysis 12/01/2011   Past Medical History  Diagnosis Date  . Hypertension   . HIV infection   . Hepatitis   . G6PD deficiency   . Chronic systolic heart failure       Review of Systems Positive pertinents listed in hpi. Otherwise 10 points are negative    Objective:   Physical Exam BP 158/104  Pulse 93  Temp 98.4 F (36.9 C) (Oral)  Wt 160 lb (72.576 kg)  SpO2 100% Physical Exam  Constitutional: He is oriented to person, place, and time. He appears well-developed and well-nourished. No distress.  HENT:  Mouth/Throat: Oropharynx is clear and moist. No oropharyngeal exudate.  Cardiovascular: Normal rate, regular rhythm and normal heart sounds. Exam reveals no gallop and no friction rub. No murmur heard.  Neck = ? Elevated JVD Pulmonary/Chest: Effort normal and breath sounds normal. No respiratory distress. He has no wheezes.  Abdominal: Soft. Bowel sounds are normal. He exhibits no distension. There is no tenderness.  Lymphadenopathy:  He has no cervical adenopathy.  Neurological: He is alert and oriented to person, place, and time.  Skin: Skin is warm and dry. No rash noted. No erythema.  Psychiatric: He has a normal mood and affect. His behavior is  normal.       Assessment & Plan:  HIV = continue on his current regimen  Newly diagnosed Congestive heart failure = will start diuretics with lasix 40mg  daily for 3 days then decrease to 10-20mg  daily. Will have patient come back for lab draw to ensure no significant hypokalemia  Prior hx of alcohol use = has stopped drinking beer. He states that he enjoys taste of beer, thus he may use non-alcoholic beer instead.  rtc in 1 month

## 2011-12-29 ENCOUNTER — Encounter (INDEPENDENT_AMBULATORY_CARE_PROVIDER_SITE_OTHER): Payer: Medicare Other

## 2011-12-29 ENCOUNTER — Ambulatory Visit (INDEPENDENT_AMBULATORY_CARE_PROVIDER_SITE_OTHER): Payer: Medicare Other

## 2011-12-29 VITALS — BP 100/84 | HR 90 | Ht 64.5 in | Wt 144.0 lb

## 2011-12-29 DIAGNOSIS — R55 Syncope and collapse: Secondary | ICD-10-CM | POA: Diagnosis not present

## 2011-12-29 DIAGNOSIS — I10 Essential (primary) hypertension: Secondary | ICD-10-CM

## 2011-12-29 NOTE — Progress Notes (Signed)
Patient had appointment for B/P check today and 48 hr monitor 12/29/11.Patient stated he felt better.Spoke to [facility] PA he advised to take clonidine 0.2 mg 1/2 daily for 3 days then 1/2 every other day for 3 doses then stop.Advised to keep appointment with [facility] 01/07/12.

## 2012-01-01 ENCOUNTER — Telehealth: Payer: Self-pay | Admitting: Physician Assistant

## 2012-01-01 DIAGNOSIS — I499 Cardiac arrhythmia, unspecified: Secondary | ICD-10-CM

## 2012-01-01 DIAGNOSIS — I509 Heart failure, unspecified: Secondary | ICD-10-CM

## 2012-01-01 DIAGNOSIS — I428 Other cardiomyopathies: Secondary | ICD-10-CM

## 2012-01-01 MED ORDER — LISINOPRIL 20 MG PO TABS: 20.0000 mg | ORAL_TABLET | Freq: Every day | ORAL | Status: DC

## 2012-01-01 MED ORDER — CARVEDILOL 12.5 MG PO TABS: 12.5000 mg | ORAL_TABLET | Freq: Two times a day (BID) | ORAL | Status: DC

## 2012-01-01 NOTE — Telephone Encounter (Signed)
Spoke with pt, he wrote down med changes, all tests/ meds/ labs explained and he agreed to plan. He will come in 01/04/12 for lab draw then will stop at check out to get appointments.

## 2012-01-01 NOTE — Telephone Encounter (Signed)
I reviewed his holter monitor. He has one episode of a wide complex rhythm. Not sure if this is SVT or VTach. But, with EF 20-25%, we have to be worried about VTach. He needs several things:  1.  Refer to EP as soon as possible for evaluation.  Needs to be worked in (next 1-2 weeks preferably).  Dx is probable VTach in the setting of EF 20-25%.  2.  He needs a BMET and magnesium done (today if possible).  If done today, make sure results are reviewed with provider this afternoon.     3.  Dr. Antoine Poche suggested arranging a Cardiac CTA to assess for CAD.  I was going to arrange this at his follow up with me.  I want to go ahead and order that now to hopefully get it done soon.  4.  Increase Coreg to 12.5 mg bid.  This will help with his rhythm.  Have him decrease his Lisinopril to 40 mg 1/2 tab (20 mg) daily to give him more BP to work with to adjust his beta blocker.  5.  Keep follow up as planned.  6.  Go to the ED if he has syncope or near syncope.  Thanks [place], PA-C  1:23 PM 01/01/2012

## 2012-01-01 NOTE — Telephone Encounter (Signed)
I called and spoke with pt. He is at work and states he is unable to come in for blood work today. He states will come in Monday. He is aware the lab opens at 8:30. Pt states he is unable to review medication changes or anything else with me at this time due to being at work.  I told pt he had irregular heart beat called Ventricular Tachycardia noted  on monitor and that if possible he should call back today before 5:00 to discuss and review instructions from Tereso Newcomer, [place].  I also instructed pt he should call 911 if he passes out or feels as if he may pass out.  Will place lab order for Monday

## 2012-01-04 ENCOUNTER — Other Ambulatory Visit (INDEPENDENT_AMBULATORY_CARE_PROVIDER_SITE_OTHER): Payer: Medicare Other

## 2012-01-04 DIAGNOSIS — I428 Other cardiomyopathies: Secondary | ICD-10-CM | POA: Diagnosis not present

## 2012-01-05 ENCOUNTER — Encounter: Payer: Self-pay | Admitting: *Deleted

## 2012-01-07 ENCOUNTER — Encounter: Payer: Self-pay | Admitting: Physician Assistant

## 2012-01-07 ENCOUNTER — Ambulatory Visit (INDEPENDENT_AMBULATORY_CARE_PROVIDER_SITE_OTHER): Payer: Medicare Other | Admitting: Physician Assistant

## 2012-01-07 VITALS — BP 123/95 | HR 99 | Ht 64.5 in | Wt 144.4 lb

## 2012-01-07 DIAGNOSIS — I428 Other cardiomyopathies: Secondary | ICD-10-CM | POA: Diagnosis not present

## 2012-01-07 DIAGNOSIS — R0602 Shortness of breath: Secondary | ICD-10-CM | POA: Diagnosis not present

## 2012-01-07 DIAGNOSIS — I5022 Chronic systolic (congestive) heart failure: Secondary | ICD-10-CM

## 2012-01-07 DIAGNOSIS — I509 Heart failure, unspecified: Secondary | ICD-10-CM

## 2012-01-07 DIAGNOSIS — R000 Tachycardia, unspecified: Secondary | ICD-10-CM | POA: Insufficient documentation

## 2012-01-07 DIAGNOSIS — I472 Ventricular tachycardia: Secondary | ICD-10-CM

## 2012-01-07 DIAGNOSIS — R55 Syncope and collapse: Secondary | ICD-10-CM | POA: Insufficient documentation

## 2012-01-07 DIAGNOSIS — I4729 Other ventricular tachycardia: Secondary | ICD-10-CM

## 2012-01-07 DIAGNOSIS — I10 Essential (primary) hypertension: Secondary | ICD-10-CM

## 2012-01-07 LAB — BASIC METABOLIC PANEL
BUN: 16 mg/dL (ref 6–23)
CO2: 26 mEq/L (ref 19–32)
Calcium: 9.2 mg/dL (ref 8.4–10.5)
Creatinine, Ser: 1.1 mg/dL (ref 0.4–1.5)
Glucose, Bld: 82 mg/dL (ref 70–99)
Sodium: 141 mEq/L (ref 135–145)

## 2012-01-07 MED ORDER — CARVEDILOL 25 MG PO TABS: 25.0000 mg | ORAL_TABLET | Freq: Two times a day (BID) | ORAL | Status: DC

## 2012-01-07 MED ORDER — LISINOPRIL 20 MG PO TABS: 10.0000 mg | ORAL_TABLET | Freq: Every day | ORAL | Status: DC

## 2012-01-07 MED ORDER — POTASSIUM CHLORIDE ER 10 MEQ PO TBCR: 10.0000 meq | EXTENDED_RELEASE_TABLET | Freq: Every day | ORAL | Status: DC

## 2012-01-07 NOTE — Patient Instructions (Addendum)
Your physician recommends that you return for lab work in: TODAY BMET, MAG, BNP  STOP CLONIDINE  INCREASE COREG TO 25 MG 1 TABLET TWICE DAILY A NEW PRESCRIPTION WAS SENT IN TODAY  NO DRIVING UNTIL CLEARED BY CARDIOLOGY  KEEP APPT DTR. Ladona Ridgel 01/25/12  FOLLOW UP WITH DR. HOCHREIN IN 4 WEEKS, IF NOT AVAILABLE THEN WITH SCOTT WEAVER, PAC SAME DAY DR. Antoine Poche IS IN THE OFFICE, IF THIS IS DONE THEN MAKE APPT TO SEE DR. HOCHREIN IN 3 MONTHS PER SCOTT WEAVER , PAC

## 2012-01-07 NOTE — Progress Notes (Signed)
[address] [place], [place]  [address] Phone: [phone] Fax:  [phone]  Date:  01/07/2012   Name:  [name]   DOB:  1965-11-24   MRN:  [id number]  PCP:  [name], MD  Primary Cardiologist:  Dr. Rollene Rotunda  Primary Electrophysiologist:  None    History of Present Illness: [name] is a 46 y.o. male who returns for follow up.  He has a h/o HIV, HTN, G6PD deficiency, HBV, HCV.  He was recently dx with systolic CHF.  Echo 12/05/11:  EF 20-25%, diff HK with mild sparing of IL wall, mild AI, mod MR, mild LAE, mild RVE, mild reduced RVF.  I saw him 7/22 in followup. We were confused about his medications and got his list corrected. He was hypotensive that day but asymptomatic. He had a syncopal episode at work. He works in [employer] where he stands for long hours and it is quite hot. Holter monitor was placed.  We told him not to drive. We adjusted his medicines further due to hypotension and took him off clonidine, increased coreg. His Holter monitor demonstrated SVT versus VTach. He is being referred to EP and we are also in the process of arranging his cardiac CT. Doing ok today.  Notes some abdominal pain but thinks it is related to his diet.  No orthopnea, PND, edema.  No significant dyspnea.  No syncope.  He does note another episode of near syncope.  He felt an ache in his chest with that, but no other chest pain.   Wt Readings from Last 3 Encounters:  01/07/12 144 lb 6.4 oz (65.499 kg)  12/29/11 144 lb (65.318 kg)  12/21/11 142 lb (64.411 kg)     Past Medical History  Diagnosis Date  . Hypertension   . HIV infection   . Hepatitis     Hep B and Hep C (patient does not report this but these are listed in previous notes.)  . G6PD deficiency   . Chronic systolic heart failure     Echo 12/05/11:  EF 20-25%, diff HK with mild sparing of IL wall, mild AI, mod MR, mild LAE, mild RVE, mild reduced RVF.    Current Outpatient  Prescriptions  Medication Sig Dispense Refill  . carvedilol (COREG) 12.5 MG tablet Take 1 tablet (12.5 mg total) by mouth 2 (two) times daily.  60 tablet  6  . cloNIDine (CATAPRES) 0.1 MG tablet Take 1 tablet (0.1 mg total) by mouth 2 (two) times daily.  60 tablet  6  . darunavir (PREZISTA) 800 MG tablet Take 1 tablet (800 mg total) by mouth daily with breakfast.  30 tablet  6  . emtricitabine-tenofovir (TRUVADA) 200-300 MG per tablet Take 1 tablet by mouth daily.  30 tablet  6  . etravirine (INTELENCE) 100 MG tablet Take 2 tablets (200 mg total) by mouth 2 (two) times daily with a meal.  120 tablet  6  . furosemide (LASIX) 20 MG tablet Take 20 mg twice a day  60 tablet  6  . lisinopril (PRINIVIL,ZESTRIL) 20 MG tablet Take 1 tablet (20 mg total) by mouth daily.  30 tablet  6  . ritonavir (NORVIR) 100 MG capsule Take 1 capsule (100 mg total) by mouth 2 (two) times daily.  60 capsule  6    Allergies: Allergies  Allergen Reactions  . Bactrim Rash    History  Substance Use Topics  . Smoking status: Never Smoker   .  Smokeless tobacco: Never Used  . Alcohol Use: 3.5 oz/week    7 drink(s) per week     socially     ROS:  Please see the history of present illness.     All other systems reviewed and negative.   PHYSICAL EXAM: VS:  BP 123/95  Pulse 99  Ht 5' 4.5" (1.638 m)  Wt 144 lb 6.4 oz (65.499 kg)  BMI 24.40 kg/m2 Well nourished, well developed, in no acute distress HEENT: normal Neck: no JVD Cardiac:  normal S1, S2; RRR; no murmur; + S3 Lungs:  clear to auscultation bilaterally, no wheezing, rhonchi or rales Abd: soft, nontender, no hepatomegaly Ext: no edema Skin: warm and dry Neuro:  CNs 2-12 intact, no focal abnormalities noted  EKG:    Sinus rhythm, heart rate 99, interventricular conduction delay   ASSESSMENT AND PLAN:  1.  Chronic Systolic CHF Volume appears stable. However, he has had some abdominal pain which he presented with when he came to the hospital for  acute CHF. He is due to have a basic metabolic panel and magnesium drawn today. I will also obtain a BNP. If this is increasing, I will adjust his Lasix for a few days.  I will increase his carvedilol to 25 mg twice a day. Continue other medications.  He will see Dr. Ladona Ridgel in 2 weeks. Cardiac CT is arranged for next week. I will make sure he has followup with Dr. Antoine Poche or me in one month.  Of note, I discussed the patient's case today with Dr. Antoine Poche. At this point, we will continue with the current plan.  2. Syncope As noted, he had wide-complex tachycardia on his Holter monitor. As noted, he sees electrophysiology in 2 weeks. He's been told not to drive. We are titrating his beta blocker therapy.  3.  Hypertension This will be treated in the context of treating his cardiomyopathy and CHF.  Signed, Tereso Newcomer, PA-C  9:18 AM 01/07/2012

## 2012-01-13 ENCOUNTER — Other Ambulatory Visit (HOSPITAL_COMMUNITY): Payer: Medicare Other

## 2012-01-20 ENCOUNTER — Ambulatory Visit (HOSPITAL_COMMUNITY)
Admission: RE | Admit: 2012-01-20 | Discharge: 2012-01-20 | Disposition: A | Discharge status: Home / Self Care | Payer: Medicare Other | Source: Ambulatory Visit | Attending: Physician Assistant | Admitting: Physician Assistant

## 2012-01-20 VITALS — BP 122/84 | HR 73 | Resp 16

## 2012-01-20 DIAGNOSIS — I2510 Atherosclerotic heart disease of native coronary artery without angina pectoris: Secondary | ICD-10-CM | POA: Diagnosis not present

## 2012-01-20 DIAGNOSIS — I499 Cardiac arrhythmia, unspecified: Secondary | ICD-10-CM

## 2012-01-20 DIAGNOSIS — R079 Chest pain, unspecified: Secondary | ICD-10-CM | POA: Insufficient documentation

## 2012-01-20 DIAGNOSIS — I509 Heart failure, unspecified: Secondary | ICD-10-CM

## 2012-01-20 DIAGNOSIS — I428 Other cardiomyopathies: Secondary | ICD-10-CM

## 2012-01-20 MED ORDER — NITROGLYCERIN 0.4 MG SL SUBL
SUBLINGUAL_TABLET | SUBLINGUAL | Status: AC
  Filled 2012-01-20: qty 25

## 2012-01-20 MED ORDER — METOPROLOL TARTRATE 1 MG/ML IV SOLN
INTRAVENOUS | Status: AC
  Filled 2012-01-20: qty 20

## 2012-01-20 MED ORDER — METOPROLOL TARTRATE 1 MG/ML IV SOLN
5.0000 mg | INTRAVENOUS | Status: AC | PRN
  Administered 2012-01-20 (×4): 5 mg via INTRAVENOUS

## 2012-01-20 MED ORDER — METOPROLOL TARTRATE 1 MG/ML IV SOLN
INTRAVENOUS | Status: AC
  Filled 2012-01-20: qty 10

## 2012-01-20 MED ORDER — NITROGLYCERIN 0.4 MG SL SUBL
0.4000 mg | SUBLINGUAL_TABLET | Freq: Once | SUBLINGUAL | Status: AC
  Administered 2012-01-20: 0.4 mg via SUBLINGUAL
  Filled 2012-01-20: qty 25

## 2012-01-20 MED ORDER — METOPROLOL TARTRATE 1 MG/ML IV SOLN
5.0000 mg | Freq: Once | INTRAVENOUS | Status: AC
  Administered 2012-01-20: 5 mg via INTRAVENOUS
  Filled 2012-01-20: qty 5

## 2012-01-20 MED ORDER — IOHEXOL 350 MG/ML SOLN
80.0000 mL | Freq: Once | INTRAVENOUS | Status: AC | PRN
  Administered 2012-01-20: 80 mL via INTRAVENOUS

## 2012-01-21 DIAGNOSIS — R079 Chest pain, unspecified: Secondary | ICD-10-CM

## 2012-01-24 ENCOUNTER — Encounter: Payer: Self-pay | Admitting: Physician Assistant

## 2012-01-25 ENCOUNTER — Ambulatory Visit (INDEPENDENT_AMBULATORY_CARE_PROVIDER_SITE_OTHER): Payer: Medicaid Other | Admitting: Internal Medicine

## 2012-01-25 ENCOUNTER — Encounter: Payer: Self-pay | Admitting: Internal Medicine

## 2012-01-25 VITALS — BP 110/70 | HR 80 | Ht 64.0 in | Wt 145.1 lb

## 2012-01-25 DIAGNOSIS — R55 Syncope and collapse: Secondary | ICD-10-CM | POA: Diagnosis not present

## 2012-01-25 DIAGNOSIS — I4729 Other ventricular tachycardia: Secondary | ICD-10-CM

## 2012-01-25 DIAGNOSIS — Z01812 Encounter for preprocedural laboratory examination: Secondary | ICD-10-CM

## 2012-01-25 DIAGNOSIS — I472 Ventricular tachycardia, unspecified: Secondary | ICD-10-CM

## 2012-01-25 DIAGNOSIS — R000 Tachycardia, unspecified: Secondary | ICD-10-CM

## 2012-01-25 DIAGNOSIS — I428 Other cardiomyopathies: Secondary | ICD-10-CM

## 2012-01-25 DIAGNOSIS — I10 Essential (primary) hypertension: Secondary | ICD-10-CM

## 2012-01-25 DIAGNOSIS — I5022 Chronic systolic (congestive) heart failure: Secondary | ICD-10-CM | POA: Insufficient documentation

## 2012-01-25 NOTE — Assessment & Plan Note (Signed)
His current symptoms are class 1-2. He appears to be a maximal medical therapy. He will ultimately need repeat echocardiogram 3 months after his diagnoses of heart failure. If he has inducible ventricular tachycardia then ICD implantation would be warranted.

## 2012-01-25 NOTE — Patient Instructions (Addendum)
We will do your procedure on 9/20 -- you will receive a letter in the mail with more information  Please come in the office on Sept 12 for lab work

## 2012-01-25 NOTE — Progress Notes (Signed)
HPI [name] is referred by Dr. Hochrein for evaluation of a wide QRS tachycardia and syncope in the setting of a dilated cardiomyopathy. He has a h/o LV dysfunction documented by 2D echo. He has a remote h/o syncope dating back several years. He has had one recent episode. He now has class 2 CHF symptoms. He underwent cardiac CT scanning a few days ago and had minimal Calcification and no obstructive disease. He has palpitations. He wore a cardiac monitor and this demonstrated a Wide QRS tachycardia at 158/min. He has had perpheral edema in the past.  Allergies  Allergen Reactions  . Bactrim Rash     Current Outpatient Prescriptions  Medication Sig Dispense Refill  . carvedilol (COREG) 25 MG tablet Take 1 tablet (25 mg total) by mouth 2 (two) times daily with a meal.  60 tablet  11  . darunavir (PREZISTA) 800 MG tablet Take 1 tablet (800 mg total) by mouth daily with breakfast.  30 tablet  6  . emtricitabine-tenofovir (TRUVADA) 200-300 MG per tablet Take 1 tablet by mouth daily.  30 tablet  6  . etravirine (INTELENCE) 100 MG tablet Take 2 tablets (200 mg total) by mouth 2 (two) times daily with a meal.  120 tablet  6  . furosemide (LASIX) 20 MG tablet Take 20 mg twice a day  60 tablet  6  . lisinopril (PRINIVIL,ZESTRIL) 20 MG tablet Take 0.5 tablets (10 mg total) by mouth daily.      . potassium chloride (K-DUR) 10 MEQ tablet Take 1 tablet (10 mEq total) by mouth daily.  30 tablet  11  . ritonavir (NORVIR) 100 MG capsule Take 1 capsule (100 mg total) by mouth 2 (two) times daily.  60 capsule  6     Past Medical History  Diagnosis Date  . Hypertension   . HIV infection   . Hepatitis     Hep B and Hep C (patient does not report this but these are listed in previous notes.)  . G6PD deficiency   . Chronic systolic heart failure     Echo 12/05/11:  EF 20-25%, diff HK with mild sparing of IL wall, mild AI, mod MR, mild LAE, mild RVE, mild reduced RVF.  . NICM (nonischemic cardiomyopathy)     cardia CTA 8/13 negative for obstructive CAD    ROS:   All systems reviewed and negative except as noted in the HPI.   Past Surgical History  Procedure Date  . Finger surgery     Thumb laceration.    . Esophagogastroduodenoscopy 12/07/2011    Procedure: ESOPHAGOGASTRODUODENOSCOPY (EGD);  Surgeon: Malcolm T Stark, MD,FACG;  Location: [facility] ENDOSCOPY;  Service: Endoscopy;  Laterality: N/A;     Family History  Problem Relation Age of Onset  . Cancer Mother      History   Social History  . Marital Status: Single    Spouse Name: N/A    Number of Children: N/A  . Years of Education: N/A   Occupational History  . Not on file.   Social History Main Topics  . Smoking status: Never Smoker   . Smokeless tobacco: Never Used  . Alcohol Use: 3.5 oz/week    7 drink(s) per week     socially  . Drug Use: 7 per week     marijuana daily  . Sexually Active: Yes -- Male partner(s)     28 year relationship, declined condoms   Other Topics Concern  . Not on file   Social   History Narrative   Lives alone.  Drinks beer daily.  Liquor rarely.  He occasionally smokes marijuana..     BP 110/70  Pulse 80  Ht 5' 4" (1.626 m)  Wt 145 lb 1.9 oz (65.826 kg)  BMI 24.91 kg/m2  SpO2 98%  Physical Exam:  Well appearing middle aged man, NAD HEENT: Unremarkable Neck:  No JVD, no thyromegally Lungs:  Clear with no wheezes, rales, or rhonchi HEART:  Regular rate rhythm, no murmurs, no rubs, no clicks Abd:  soft, positive bowel sounds, no organomegally, no rebound, no guarding Ext:  2 plus pulses, no edema, no cyanosis, no clubbing Skin:  No rashes no nodules Neuro:  CN II through XII intact, motor grossly intact  EKG NSR with rate related bundle branch block.  Assess/Plan:  

## 2012-01-25 NOTE — Assessment & Plan Note (Signed)
His blood pressure appears to be well-controlled. He'll continue his current medical therapy and maintain a low-sodium diet.

## 2012-01-25 NOTE — Assessment & Plan Note (Signed)
I suspect his wide QRS tachy is due to SVT with abherration but his LV dysfunction puts him at risk for VT as well. He has had syncope but not associated with palpitations. I have recommended invasive EP Study and catheter ablation for SVT, ICD implant for inducible VT. The risks, goals, benefits, and expectations of the procedure have discussed with the patient and he wishes to proceed.

## 2012-01-26 ENCOUNTER — Telehealth: Payer: Self-pay | Admitting: *Deleted

## 2012-01-26 ENCOUNTER — Encounter: Payer: Self-pay | Admitting: *Deleted

## 2012-01-26 ENCOUNTER — Other Ambulatory Visit: Payer: Self-pay | Admitting: *Deleted

## 2012-01-26 DIAGNOSIS — I472 Ventricular tachycardia, unspecified: Secondary | ICD-10-CM

## 2012-01-26 DIAGNOSIS — I10 Essential (primary) hypertension: Secondary | ICD-10-CM

## 2012-01-26 DIAGNOSIS — I428 Other cardiomyopathies: Secondary | ICD-10-CM

## 2012-01-26 DIAGNOSIS — R55 Syncope and collapse: Secondary | ICD-10-CM

## 2012-01-26 NOTE — Telephone Encounter (Signed)
Message copied by Tarri Fuller on Tue Jan 26, 2012  9:50 AM ------      Message from: [place], [place] T      Created: Sun Jan 24, 2012  6:48 AM       No evidence of significant CAD on cardiac CTA      Tell patient Cardiomyopathy not related to a blockage.      Does have mild plaque and calcium        -  Do not seen fasting lipids in his chart. Suspect he may need to take a statin.        -  Arrange fasting lipids       Tereso Newcomer, PA-C  6:45 AM 01/24/2012

## 2012-01-26 NOTE — Telephone Encounter (Signed)
lmom ptcb to go over Cardiac CT results.

## 2012-02-05 ENCOUNTER — Encounter (HOSPITAL_COMMUNITY): Payer: Self-pay | Admitting: Pharmacy Technician

## 2012-02-10 ENCOUNTER — Telehealth: Payer: Self-pay | Admitting: *Deleted

## 2012-02-10 ENCOUNTER — Encounter: Payer: Self-pay | Admitting: *Deleted

## 2012-02-10 NOTE — Telephone Encounter (Signed)
unable to reach pt. I have made several attempts to reach pt to go over results. I will now send out a results letter today to pt

## 2012-02-10 NOTE — Telephone Encounter (Signed)
Message copied by Tarri Fuller on Wed Feb 10, 2012  5:40 PM ------      Message from: [place], [place] T      Created: Sun Jan 24, 2012  6:48 AM       No evidence of significant CAD on cardiac CTA      Tell patient Cardiomyopathy not related to a blockage.      Does have mild plaque and calcium        -  Do not seen fasting lipids in his chart. Suspect he may need to take a statin.        -  Arrange fasting lipids       Tereso Newcomer, PA-C  6:45 AM 01/24/2012

## 2012-02-11 ENCOUNTER — Other Ambulatory Visit (INDEPENDENT_AMBULATORY_CARE_PROVIDER_SITE_OTHER): Payer: Medicare Other

## 2012-02-11 DIAGNOSIS — I428 Other cardiomyopathies: Secondary | ICD-10-CM

## 2012-02-11 DIAGNOSIS — I4729 Other ventricular tachycardia: Secondary | ICD-10-CM

## 2012-02-11 DIAGNOSIS — R55 Syncope and collapse: Secondary | ICD-10-CM

## 2012-02-11 DIAGNOSIS — Z01812 Encounter for preprocedural laboratory examination: Secondary | ICD-10-CM | POA: Diagnosis not present

## 2012-02-11 DIAGNOSIS — I472 Ventricular tachycardia, unspecified: Secondary | ICD-10-CM

## 2012-02-11 LAB — CBC WITH DIFFERENTIAL/PLATELET
Basophils Absolute: 0 10*3/uL (ref 0.0–0.1)
Eosinophils Absolute: 0.2 10*3/uL (ref 0.0–0.7)
HCT: 43.8 % (ref 39.0–52.0)
Hemoglobin: 13.9 g/dL (ref 13.0–17.0)
Lymphs Abs: 2.3 10*3/uL (ref 0.7–4.0)
MCHC: 31.8 g/dL (ref 30.0–36.0)
Neutro Abs: 3.4 10*3/uL (ref 1.4–7.7)
RDW: 16.6 % — ABNORMAL HIGH (ref 11.5–14.6)

## 2012-02-12 LAB — BASIC METABOLIC PANEL
CO2: 21 mEq/L (ref 19–32)
Glucose, Bld: 121 mg/dL — ABNORMAL HIGH (ref 70–99)
Potassium: 4 mEq/L (ref 3.5–5.1)
Sodium: 140 mEq/L (ref 135–145)

## 2012-02-18 DIAGNOSIS — B1910 Unspecified viral hepatitis B without hepatic coma: Secondary | ICD-10-CM | POA: Diagnosis not present

## 2012-02-18 DIAGNOSIS — I5022 Chronic systolic (congestive) heart failure: Secondary | ICD-10-CM | POA: Diagnosis not present

## 2012-02-18 DIAGNOSIS — R000 Tachycardia, unspecified: Secondary | ICD-10-CM | POA: Diagnosis not present

## 2012-02-18 DIAGNOSIS — I428 Other cardiomyopathies: Secondary | ICD-10-CM | POA: Diagnosis not present

## 2012-02-18 DIAGNOSIS — B1920 Unspecified viral hepatitis C without hepatic coma: Secondary | ICD-10-CM | POA: Diagnosis not present

## 2012-02-18 DIAGNOSIS — I10 Essential (primary) hypertension: Secondary | ICD-10-CM | POA: Diagnosis not present

## 2012-02-18 DIAGNOSIS — I509 Heart failure, unspecified: Secondary | ICD-10-CM | POA: Diagnosis not present

## 2012-02-18 DIAGNOSIS — Z21 Asymptomatic human immunodeficiency virus [HIV] infection status: Secondary | ICD-10-CM | POA: Diagnosis not present

## 2012-02-18 MED ORDER — CEFAZOLIN SODIUM-DEXTROSE 2-3 GM-% IV SOLR
2.0000 g | INTRAVENOUS | Status: AC
  Filled 2012-02-18 (×2): qty 50

## 2012-02-18 MED ORDER — SODIUM CHLORIDE 0.9 % IJ SOLN: 3.0000 mL | Freq: Two times a day (BID) | INTRAMUSCULAR | Status: DC

## 2012-02-18 MED ORDER — SODIUM CHLORIDE 0.9 % IR SOLN
80.0000 mg | Status: DC
  Filled 2012-02-18 (×2): qty 2

## 2012-02-18 MED ORDER — SODIUM CHLORIDE 0.9 % IJ SOLN: 3.0000 mL | INTRAMUSCULAR | Status: DC | PRN

## 2012-02-18 MED ORDER — SODIUM CHLORIDE 0.45 % IV SOLN
INTRAVENOUS | Status: DC
  Administered 2012-02-19: 06:00:00 via INTRAVENOUS

## 2012-02-18 MED ORDER — SODIUM CHLORIDE 0.9 % IV SOLN: 250.0000 mL | INTRAVENOUS | Status: DC

## 2012-02-19 ENCOUNTER — Encounter (HOSPITAL_COMMUNITY)
Admission: RE | Disposition: A | Discharge status: Home / Self Care | Payer: Self-pay | Source: Ambulatory Visit | Attending: Internal Medicine

## 2012-02-19 ENCOUNTER — Ambulatory Visit (HOSPITAL_COMMUNITY)
Admission: RE | Admit: 2012-02-19 | Discharge: 2012-02-19 | Disposition: A | Discharge status: Home / Self Care | Payer: Medicare Other | Source: Ambulatory Visit | Attending: Internal Medicine | Admitting: Internal Medicine

## 2012-02-19 DIAGNOSIS — Z21 Asymptomatic human immunodeficiency virus [HIV] infection status: Secondary | ICD-10-CM | POA: Insufficient documentation

## 2012-02-19 DIAGNOSIS — I10 Essential (primary) hypertension: Secondary | ICD-10-CM | POA: Insufficient documentation

## 2012-02-19 DIAGNOSIS — I5022 Chronic systolic (congestive) heart failure: Secondary | ICD-10-CM | POA: Insufficient documentation

## 2012-02-19 DIAGNOSIS — I472 Ventricular tachycardia, unspecified: Secondary | ICD-10-CM

## 2012-02-19 DIAGNOSIS — R55 Syncope and collapse: Secondary | ICD-10-CM

## 2012-02-19 DIAGNOSIS — I4729 Other ventricular tachycardia: Secondary | ICD-10-CM

## 2012-02-19 DIAGNOSIS — I428 Other cardiomyopathies: Secondary | ICD-10-CM | POA: Insufficient documentation

## 2012-02-19 DIAGNOSIS — B1920 Unspecified viral hepatitis C without hepatic coma: Secondary | ICD-10-CM | POA: Insufficient documentation

## 2012-02-19 DIAGNOSIS — R000 Tachycardia, unspecified: Secondary | ICD-10-CM | POA: Insufficient documentation

## 2012-02-19 DIAGNOSIS — I509 Heart failure, unspecified: Secondary | ICD-10-CM | POA: Insufficient documentation

## 2012-02-19 DIAGNOSIS — B1910 Unspecified viral hepatitis B without hepatic coma: Secondary | ICD-10-CM | POA: Insufficient documentation

## 2012-02-19 HISTORY — PX: ELECTROPHYSIOLOGY STUDY: SHX5467

## 2012-02-19 LAB — SURGICAL PCR SCREEN: MRSA, PCR: NEGATIVE

## 2012-02-19 SURGERY — ELECTROPHYSIOLOGY STUDY
Anesthesia: LOCAL

## 2012-02-19 MED ORDER — FENTANYL CITRATE 0.05 MG/ML IJ SOLN
INTRAMUSCULAR | Status: AC
  Filled 2012-02-19: qty 2

## 2012-02-19 MED ORDER — SODIUM CHLORIDE 0.9 % IJ SOLN: 3.0000 mL | INTRAMUSCULAR | Status: DC | PRN

## 2012-02-19 MED ORDER — MIDAZOLAM HCL 5 MG/5ML IJ SOLN
INTRAMUSCULAR | Status: AC
  Filled 2012-02-19: qty 5

## 2012-02-19 MED ORDER — HYDROXYUREA 500 MG PO CAPS
ORAL_CAPSULE | ORAL | Status: AC
  Filled 2012-02-19: qty 1

## 2012-02-19 MED ORDER — MUPIROCIN 2 % EX OINT
TOPICAL_OINTMENT | CUTANEOUS | Status: AC
  Filled 2012-02-19: qty 22

## 2012-02-19 MED ORDER — ONDANSETRON HCL 4 MG/2ML IJ SOLN: 4.0000 mg | Freq: Four times a day (QID) | INTRAMUSCULAR | Status: DC | PRN

## 2012-02-19 MED ORDER — SODIUM CHLORIDE 0.9 % IV SOLN: 250.0000 mL | INTRAVENOUS | Status: DC | PRN

## 2012-02-19 MED ORDER — HEPARIN (PORCINE) IN NACL 2-0.9 UNIT/ML-% IJ SOLN
INTRAMUSCULAR | Status: AC
  Filled 2012-02-19: qty 500

## 2012-02-19 MED ORDER — SODIUM CHLORIDE 0.9 % IJ SOLN: 3.0000 mL | Freq: Two times a day (BID) | INTRAMUSCULAR | Status: DC

## 2012-02-19 MED ORDER — MUPIROCIN 2 % EX OINT: TOPICAL_OINTMENT | Freq: Two times a day (BID) | CUTANEOUS | Status: DC

## 2012-02-19 MED ORDER — BUPIVACAINE HCL (PF) 0.25 % IJ SOLN
INTRAMUSCULAR | Status: AC
  Filled 2012-02-19: qty 60

## 2012-02-19 MED ORDER — ACETAMINOPHEN 325 MG PO TABS
650.0000 mg | ORAL_TABLET | ORAL | Status: DC | PRN
  Filled 2012-02-19: qty 2

## 2012-02-19 NOTE — Interval H&P Note (Signed)
History and Physical Interval Note:  02/19/2012 7:28 AM  Manus Rudd  has presented today for surgery, with the diagnosis of Syncope  The various methods of treatment have been discussed with the patient and family. After consideration of risks, benefits and other options for treatment, the patient has consented to  Procedure(s) (LRB) with comments: ELECTROPHYSIOLOGY STUDY (N/A) with possible ICD or SVT ablation as a surgical intervention .  The patient's history has been reviewed, patient examined, no change in status, stable for surgery.  I have reviewed the patient's chart and labs.  Questions were answered to the patient's satisfaction.     Buel Ream.D.

## 2012-02-19 NOTE — H&P (View-Only) (Signed)
HPI [name] is referred by Dr. Antoine Poche for evaluation of a wide QRS tachycardia and syncope in the setting of a dilated cardiomyopathy. He has a h/o LV dysfunction documented by 2D echo. He has a remote h/o syncope dating back several years. He has had one recent episode. He now has class 2 CHF symptoms. He underwent cardiac CT scanning a few days ago and had minimal Calcification and no obstructive disease. He has palpitations. He wore a cardiac monitor and this demonstrated a Wide QRS tachycardia at 158/min. He has had perpheral edema in the past.  Allergies  Allergen Reactions  . Bactrim Rash     Current Outpatient Prescriptions  Medication Sig Dispense Refill  . carvedilol (COREG) 25 MG tablet Take 1 tablet (25 mg total) by mouth 2 (two) times daily with a meal.  60 tablet  11  . darunavir (PREZISTA) 800 MG tablet Take 1 tablet (800 mg total) by mouth daily with breakfast.  30 tablet  6  . emtricitabine-tenofovir (TRUVADA) 200-300 MG per tablet Take 1 tablet by mouth daily.  30 tablet  6  . etravirine (INTELENCE) 100 MG tablet Take 2 tablets (200 mg total) by mouth 2 (two) times daily with a meal.  120 tablet  6  . furosemide (LASIX) 20 MG tablet Take 20 mg twice a day  60 tablet  6  . lisinopril (PRINIVIL,ZESTRIL) 20 MG tablet Take 0.5 tablets (10 mg total) by mouth daily.      . potassium chloride (K-DUR) 10 MEQ tablet Take 1 tablet (10 mEq total) by mouth daily.  30 tablet  11  . ritonavir (NORVIR) 100 MG capsule Take 1 capsule (100 mg total) by mouth 2 (two) times daily.  60 capsule  6     Past Medical History  Diagnosis Date  . Hypertension   . HIV infection   . Hepatitis     Hep B and Hep C (patient does not report this but these are listed in previous notes.)  . G6PD deficiency   . Chronic systolic heart failure     Echo 12/05/11:  EF 20-25%, diff HK with mild sparing of IL wall, mild AI, mod MR, mild LAE, mild RVE, mild reduced RVF.  Marland Kitchen NICM (nonischemic cardiomyopathy)     cardia CTA 8/13 negative for obstructive CAD    ROS:   All systems reviewed and negative except as noted in the HPI.   Past Surgical History  Procedure Date  . Finger surgery     Thumb laceration.    . Esophagogastroduodenoscopy 12/07/2011    Procedure: ESOPHAGOGASTRODUODENOSCOPY (EGD);  Surgeon: [name], MD,FACG;  Location: [facility] ENDOSCOPY;  Service: Endoscopy;  Laterality: N/A;     Family History  Problem Relation Age of Onset  . Cancer Mother      History   Social History  . Marital Status: Single    Spouse Name: N/A    Number of Children: N/A  . Years of Education: N/A   Occupational History  . Not on file.   Social History Main Topics  . Smoking status: Never Smoker   . Smokeless tobacco: Never Used  . Alcohol Use: 3.5 oz/week    7 drink(s) per week     socially  . Drug Use: 7 per week     marijuana daily  . Sexually Active: Yes -- Male partner(s)     28 year relationship, declined condoms   Other Topics Concern  . Not on file   Social  History Narrative   Lives alone.  Drinks beer daily.  Liquor rarely.  He occasionally smokes marijuana..     BP 110/70  Pulse 80  Ht 5\' 4"  (1.626 m)  Wt 145 lb 1.9 oz (65.826 kg)  BMI 24.91 kg/m2  SpO2 98%  Physical Exam:  Well appearing middle aged man, NAD HEENT: Unremarkable Neck:  No JVD, no thyromegally Lungs:  Clear with no wheezes, rales, or rhonchi HEART:  Regular rate rhythm, no murmurs, no rubs, no clicks Abd:  soft, positive bowel sounds, no organomegally, no rebound, no guarding Ext:  2 plus pulses, no edema, no cyanosis, no clubbing Skin:  No rashes no nodules Neuro:  CN II through XII intact, motor grossly intact  EKG NSR with rate related bundle branch block.  Assess/Plan:

## 2012-02-19 NOTE — Op Note (Signed)
Invasive EP study performed without immediate complication. [id number].

## 2012-02-20 NOTE — Op Note (Signed)
[name], [name] NO.:  [id number]  MEDICAL RECORD NO.:  [id number]  LOCATION:  [facility]                         FACILITY:  [facility]  PHYSICIAN:  Doylene Canning. Ladona Ridgel, MD    DATE OF BIRTH:  Jul 21, 1965  DATE OF PROCEDURE:  02/19/2012 DATE OF DISCHARGE:  02/19/2012                              OPERATIVE REPORT   PROCEDURE PERFORMED:  Invasive electrophysiologic study with the use of isoproterenol.  INDICATION:  Wide QRS tachycardia in the setting of nonischemic cardiomyopathy.  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  He had cardiac monitoring, he had evidence of wide QRS tachycardia.  This was at a rate of 160 beats per minute and was asymptomatic other than palpitations.  He does have a remote history of syncope.  He is referred now for invasive electrophysiologic study. The plan will be that if he has inducible SVT, we proceed with catheter ablation and if he has inducible VT, he will be treated with ICD implantation.  The patient has not been on maximal medical therapy for his heart failure.  If both SVT and VT are noninducible, plan would be for additional optimization of his medical therapy and consideration for prophylactic defibrillator insertion at a separate time.  PROCEDURE:  After informed consent was obtained, the patient was taken to the diagnostic EP lab in a fasting state.  After usual preparation draping, intravenous fentanyl and midazolam was given for sedation.  A 6- French quadripolar catheter was inserted percutaneously into the right femoral vein and advanced to the right ventricle.  A 6-French quadripolar catheter was inserted percutaneously in the right femoral vein and advanced to the His bundle region.  After measurement of basic intervals, rapid ventricular pacing was carried out from the right ventricle at basic drive  cycle length of 130 msec and stepwise decreased down to 380 msec where VA Wenckebach was demonstrated.  Additional rapid ventricle pacing was carried out down to 280 msec.  There was no inducible VT.  Next, programmed ventricular stimulation was carried out from the right ventricle as well as from the right ventricular outflow tract.  S1, S2; S1, S2, S3; and S1, S2, S3, and S4 stimuli were delivered with the S1, S2; S2, S3; and S3, S4 interval was stepwise decreased down to ventricular refractoriness.  During programmed ventricular stimulation, both from the RV apex as well as the RV outflow tract, there were no inducible ventricular arrhythmias.  Next, rapid atrial pacing was carried out from the right atrium at base drive cycle length of 865 msec and stepwise decreased down to 360 msec where AV Wenckebach was observed.  During rapid ventricular pacing, the PR interval was less than the RR interval and there was no inducible SVT. Next, programmed atrial stimulation was carried out from the right atrium at base drive cycle length of 784 msec.  The S1-S2 interval stepwise decreased down to 320 msec where the AV node ERP was observed. During programmed atrial stimulation, there were no AH jumps, no echo beats, and no inducible SVT.  At this point, isoproterenol was  infused at rates up from 1 to 4 mcg per minute.  Additional rapid atrial pacing and programmed atrial stimulation was carried out, but there was no inducible SVT.  The catheter was then removed.  Hemostasis was assured. The patient was returned to the recovery area in satisfactory condition.  COMPLICATIONS:  There were no immediate procedure complications.  RESULTS:  a. Baseline ECG demonstrates sinus rhythm with normal axis and intervals, QRS duration was 104 msec, and the HV interval was 53 msec. b.  Rapid ventricular pacing was carried out from the right ventricle demonstrating VA Wenckebach cycle length of 380 msec.  During  rapid ventricular pacing, the atrial activation sequence was decremental. During rapid ventricular pacing, there were no inducible ventricular arrhythmias. c. Programmed ventricular stimulation was carried out from the right ventricular apex as well as RV outflow tract.  Programmed ventricular stimulation was carried out at base drive cycle length of 161 and 400 msec.  S1, S2; S1, S2, and S3; and S1, S2, S3, and S4 stimuli were delivered with the S1, S2; S2, S3; and S3, S4 interval stepwise decreased down to ventricular fractions.  During programmed ventricular stimulation, the atrial activation sequence was decremental.  There were no inducible ventricular arrhythmias. d. Rapid atrial pacing was carried out from the right atrium, base drive cycles of 096 msec and stepwise decreased down to 360 msec where AV Wenckebach was observed.  During rapid atrial pacing, the AV Wenckebach cycle length was 360 milliseconds and there was no inducible SVT. e.  Programmed atrial stimulation was carried out from the right atrium at base drive cycle length of 045 msec.  The S1 and S2 interval stepwise decreased from 440 msec down to 320 msec with AV node ERP was observed. During programmed atrial stimulation, there were no AH jumps, no echo beats, and no inducible SVT.  CONCLUSION:  The study demonstrates a negative electrophysiologic study. There were no inducible atrial or ventricular arrhythmias despite an aggressive pacing protocol utilizing isoproterenol.  The patient's medications will be up titrated.  He will have an echocardiogram re- obtain in approximately three months.  If his ejection fraction is less than 35% and he has class 2 heart failure, then consideration will be given for prophylactic ICD implantation.     Doylene Canning. Ladona Ridgel, MD     GWT/MEDQ  D:  02/19/2012  T:  02/20/2012  Job:  [id number]

## 2012-02-22 ENCOUNTER — Other Ambulatory Visit: Payer: Self-pay | Admitting: *Deleted

## 2012-02-22 DIAGNOSIS — B20 Human immunodeficiency virus [HIV] disease: Secondary | ICD-10-CM

## 2012-02-22 NOTE — Telephone Encounter (Signed)
Pharmacy called.  Has refills rxes on file for all the pt's HIV rxes from 12/23/11.

## 2012-02-25 ENCOUNTER — Encounter: Payer: Self-pay | Admitting: Internal Medicine

## 2012-02-25 ENCOUNTER — Ambulatory Visit (INDEPENDENT_AMBULATORY_CARE_PROVIDER_SITE_OTHER): Payer: Medicare Other | Admitting: Internal Medicine

## 2012-02-25 ENCOUNTER — Encounter: Payer: Self-pay | Admitting: *Deleted

## 2012-02-25 VITALS — BP 147/103 | HR 76 | Ht 64.0 in | Wt 152.0 lb

## 2012-02-25 DIAGNOSIS — I519 Heart disease, unspecified: Secondary | ICD-10-CM | POA: Diagnosis not present

## 2012-02-25 DIAGNOSIS — R55 Syncope and collapse: Secondary | ICD-10-CM | POA: Diagnosis not present

## 2012-02-25 DIAGNOSIS — I5022 Chronic systolic (congestive) heart failure: Secondary | ICD-10-CM

## 2012-02-25 NOTE — Patient Instructions (Addendum)
Your physician wants you to follow-up in: 4 months with Dr Court Joy will receive a reminder letter in the mail two months in advance. If you don't receive a letter, please call our office to schedule the follow-up appointment.  Your physician has requested that you have an echocardiogram. Echocardiography is a painless test that uses sound waves to create images of your heart. It provides your doctor with information about the size and shape of your heart and how well your heart's chambers and valves are working. This procedure takes approximately one hour. There are no restrictions for this procedure. (IN 3 MONTHS)

## 2012-02-25 NOTE — Assessment & Plan Note (Signed)
His symptoms are currently class 1-2 on maximal medical therapy. I recommended that the patient undergo repeat 2-D echo in approximately 3 months. At that time, we will reassess his indication for ICD implantation on a prophylactic basis.

## 2012-02-25 NOTE — Progress Notes (Signed)
HPI Mr. Ertl returns today for followup. He is a 46 year old man with a history of remote syncope, recently diagnosed left ventricular dysfunction all in the setting of chronic HIV infection. The patient on cardiac monitoring had a wide QRS tachycardia. He subsequently underwent invasive EP study which demonstrated no inducible atrial or ventricular arrhythmias. The patient admits to drinking beer in excess on a daily basis. No recent episodes of syncope. His heart failure is class I. Allergies  Allergen Reactions  . Bactrim Rash     Current Outpatient Prescriptions  Medication Sig Dispense Refill  . carvedilol (COREG) 25 MG tablet Take 1 tablet (25 mg total) by mouth 2 (two) times daily with a meal.  60 tablet  11  . darunavir (PREZISTA) 800 MG tablet Take 1 tablet (800 mg total) by mouth daily with breakfast.  30 tablet  6  . emtricitabine-tenofovir (TRUVADA) 200-300 MG per tablet Take 1 tablet by mouth daily.  30 tablet  6  . etravirine (INTELENCE) 100 MG tablet Take 2 tablets (200 mg total) by mouth 2 (two) times daily with a meal.  120 tablet  6  . furosemide (LASIX) 20 MG tablet Take 20 mg twice a day  60 tablet  6  . lisinopril (PRINIVIL,ZESTRIL) 20 MG tablet Take 0.5 tablets (10 mg total) by mouth daily.      . potassium chloride (K-DUR) 10 MEQ tablet Take 1 tablet (10 mEq total) by mouth daily.  30 tablet  11  . ritonavir (NORVIR) 100 MG capsule Take 1 capsule (100 mg total) by mouth 2 (two) times daily.  60 capsule  6     Past Medical History  Diagnosis Date  . Hypertension   . HIV infection   . Hepatitis     Hep B and Hep C (patient does not report this but these are listed in previous notes.)  . G6PD deficiency   . Chronic systolic heart failure     Echo 12/05/11:  EF 20-25%, diff HK with mild sparing of IL wall, mild AI, mod MR, mild LAE, mild RVE, mild reduced RVF.  Marland Kitchen NICM (nonischemic cardiomyopathy)     cardia CTA 8/13 negative for obstructive CAD    ROS:   All  systems reviewed and negative except as noted in the HPI.   Past Surgical History  Procedure Date  . Finger surgery     Thumb laceration.    . Esophagogastroduodenoscopy 12/07/2011    Procedure: ESOPHAGOGASTRODUODENOSCOPY (EGD);  Surgeon: [name], MD,FACG;  Location: [facility] ENDOSCOPY;  Service: Endoscopy;  Laterality: N/A;     Family History  Problem Relation Age of Onset  . Cancer Mother      History   Social History  . Marital Status: Single    Spouse Name: N/A    Number of Children: N/A  . Years of Education: N/A   Occupational History  . Not on file.   Social History Main Topics  . Smoking status: Never Smoker   . Smokeless tobacco: Never Used  . Alcohol Use: 3.5 oz/week    7 drink(s) per week     socially  . Drug Use: 7 per week     marijuana daily  . Sexually Active: Yes -- Male partner(s)     28 year relationship, declined condoms   Other Topics Concern  . Not on file   Social History Narrative   Lives alone.  Drinks beer daily.  Liquor rarely.  He occasionally smokes marijuana.Marland Kitchen  BP 147/103  Pulse 76  Ht 5\' 4"  (1.626 m)  Wt 152 lb (68.947 kg)  BMI 26.09 kg/m2  Physical Exam:  Well appearing middle-aged man, NAD HEENT: Unremarkable Neck:  No JVD, no thyromegally Lungs:  Clear with no wheezes, rales, or rhonchi. HEART:  Regular rate rhythm, no murmurs, no rubs, no clicks Abd:  soft, positive bowel sounds, no organomegally, no rebound, no guarding Ext:  2 plus pulses, no edema, no cyanosis, no clubbing Skin:  No rashes no nodules Neuro:  CN II through XII intact, motor grossly intact  EKG Normal sinus rhythm with left atrial enlargement, left ventricular hypertrophy, and nonspecific T wave abnormality.     Assess/Plan:

## 2012-02-25 NOTE — Assessment & Plan Note (Signed)
His history of syncope his remote, and preceded his diagnosis of left ventricular dysfunction. The patient does have an history of alcohol abuse along with polysubstance abuse. I wonder what role his substance abuse may have had on prior syncopal episode.

## 2012-03-23 ENCOUNTER — Ambulatory Visit (INDEPENDENT_AMBULATORY_CARE_PROVIDER_SITE_OTHER): Payer: Medicare Other

## 2012-03-23 ENCOUNTER — Other Ambulatory Visit (INDEPENDENT_AMBULATORY_CARE_PROVIDER_SITE_OTHER): Payer: Medicare Other

## 2012-03-23 DIAGNOSIS — B20 Human immunodeficiency virus [HIV] disease: Secondary | ICD-10-CM

## 2012-03-23 DIAGNOSIS — Z113 Encounter for screening for infections with a predominantly sexual mode of transmission: Secondary | ICD-10-CM

## 2012-03-23 DIAGNOSIS — Z23 Encounter for immunization: Secondary | ICD-10-CM | POA: Diagnosis not present

## 2012-03-23 DIAGNOSIS — Z79899 Other long term (current) drug therapy: Secondary | ICD-10-CM | POA: Diagnosis not present

## 2012-03-23 LAB — CBC WITH DIFFERENTIAL/PLATELET
Eosinophils Relative: 4 % (ref 0–5)
HCT: 41.5 % (ref 39.0–52.0)
Lymphocytes Relative: 33 % (ref 12–46)
Lymphs Abs: 2.8 10*3/uL (ref 0.7–4.0)
MCV: 81.1 fL (ref 78.0–100.0)
Monocytes Absolute: 1 10*3/uL (ref 0.1–1.0)
RBC: 5.12 MIL/uL (ref 4.22–5.81)
WBC: 8.5 10*3/uL (ref 4.0–10.5)

## 2012-03-23 LAB — COMPREHENSIVE METABOLIC PANEL
BUN: 17 mg/dL (ref 6–23)
CO2: 25 mEq/L (ref 19–32)
Calcium: 9.5 mg/dL (ref 8.4–10.5)
Chloride: 106 mEq/L (ref 96–112)
Creat: 0.98 mg/dL (ref 0.50–1.35)

## 2012-03-23 LAB — LIPID PANEL
Cholesterol: 180 mg/dL (ref 0–200)
HDL: 58 mg/dL (ref >39)
Total CHOL/HDL Ratio: 3.1 Ratio

## 2012-03-24 LAB — T-HELPER CELL (CD4) - (RCID CLINIC ONLY): CD4 T Cell Abs: 530 uL (ref 400–2700)

## 2012-03-28 ENCOUNTER — Other Ambulatory Visit: Payer: Self-pay | Admitting: *Deleted

## 2012-03-28 DIAGNOSIS — I428 Other cardiomyopathies: Secondary | ICD-10-CM

## 2012-03-28 DIAGNOSIS — I509 Heart failure, unspecified: Secondary | ICD-10-CM

## 2012-03-28 MED ORDER — LISINOPRIL 20 MG PO TABS: 20.0000 mg | ORAL_TABLET | Freq: Every day | ORAL | Status: DC

## 2012-03-31 ENCOUNTER — Other Ambulatory Visit: Payer: Self-pay | Admitting: *Deleted

## 2012-03-31 NOTE — Telephone Encounter (Signed)
It has been over year since this rx was filled.  Pt has upcoming appt w/ Dr Luciana Axe 04/07/12.  Needs to discuss at visit.  Pharmacy notified.

## 2012-04-05 ENCOUNTER — Other Ambulatory Visit: Payer: Self-pay | Admitting: *Deleted

## 2012-04-05 DIAGNOSIS — B009 Herpesviral infection, unspecified: Secondary | ICD-10-CM

## 2012-04-05 MED ORDER — VALACYCLOVIR HCL 1 G PO TABS: 1000.0000 mg | ORAL_TABLET | Freq: Every day | ORAL | Status: DC

## 2012-04-07 ENCOUNTER — Encounter: Payer: Self-pay | Admitting: Internal Medicine

## 2012-04-07 ENCOUNTER — Ambulatory Visit (INDEPENDENT_AMBULATORY_CARE_PROVIDER_SITE_OTHER): Payer: Medicare Other | Admitting: Internal Medicine

## 2012-04-07 VITALS — BP 144/93 | HR 83 | Temp 97.6°F | Ht 65.0 in | Wt 158.5 lb

## 2012-04-07 DIAGNOSIS — B20 Human immunodeficiency virus [HIV] disease: Secondary | ICD-10-CM

## 2012-04-07 DIAGNOSIS — B009 Herpesviral infection, unspecified: Secondary | ICD-10-CM

## 2012-04-07 DIAGNOSIS — I5022 Chronic systolic (congestive) heart failure: Secondary | ICD-10-CM | POA: Diagnosis not present

## 2012-04-07 MED ORDER — VALACYCLOVIR HCL 1 G PO TABS: 1000.0000 mg | ORAL_TABLET | Freq: Two times a day (BID) | ORAL | Status: DC

## 2012-04-07 NOTE — Assessment & Plan Note (Signed)
I reviewed the findings in regards to his cardiomyopathy. It does appear that he has a severely depressed ejection fraction. I discussed this with the patient and he seems to feel that his heart is doing well. I did discuss that from my understanding of the record that his heart failure is quite severe and I am concerned that alcohol has been a contributing factor in the past and it again suggest that he should stop drinking alcohol. Otherwise, I will see him after he gets his next echocardiogram and after his procedure next month to see what the results are.

## 2012-04-07 NOTE — Progress Notes (Signed)
  Subjective:    Patient ID: [name], male    DOB: 06/01/1966, 46 y.o.   MRN: [id number]  HPI He comes in for his routine followup. He continues to take his salvage regimen very well with no missed doses. He is on etravirine, Prezista with Norvir and Truvada. He has good tolerance.  Since his last visit with me, a lot has happened for him. He has developed a severe dilated cardiomyopathy. He is under the care of cardiology and has had several hospitalizations. His dilated cardiomyopathy appears to be nonischemic in nature. I suspect this is secondary to his daily alcohol intake form of beer. The patient tells me he feels well and does take all his other medications as well. He tells me that he feels that his heart is now fine though in review of the record I am concerned with his severe depressed ejection fraction. He is scheduled for a repeat cardiac procedure which sounds like a catheterization next month.   Review of Systems  Constitutional: Negative for fever, chills, appetite change and fatigue.  HENT: Negative for sore throat and trouble swallowing.   Respiratory: Negative for cough and shortness of breath.   Cardiovascular: Negative for chest pain, palpitations and leg swelling.  Gastrointestinal: Positive for diarrhea. Negative for abdominal pain and constipation.  Musculoskeletal: Negative for myalgias, joint swelling and arthralgias.  Skin: Negative for rash.  Neurological: Negative for dizziness and headaches.       Objective:   Physical Exam        Assessment & Plan:

## 2012-04-07 NOTE — Assessment & Plan Note (Signed)
He is doing well with his HIV and his CD4 count has actually increased to over 500 for the first time. I encouraged continued complaints and he is pleased with his regimen

## 2012-05-11 ENCOUNTER — Ambulatory Visit (HOSPITAL_COMMUNITY): Payer: Medicare Other | Attending: Internal Medicine | Admitting: Radiology

## 2012-05-11 DIAGNOSIS — K759 Inflammatory liver disease, unspecified: Secondary | ICD-10-CM | POA: Insufficient documentation

## 2012-05-11 DIAGNOSIS — I509 Heart failure, unspecified: Secondary | ICD-10-CM | POA: Insufficient documentation

## 2012-05-11 DIAGNOSIS — I517 Cardiomegaly: Secondary | ICD-10-CM | POA: Insufficient documentation

## 2012-05-11 DIAGNOSIS — I10 Essential (primary) hypertension: Secondary | ICD-10-CM | POA: Insufficient documentation

## 2012-05-11 DIAGNOSIS — I519 Heart disease, unspecified: Secondary | ICD-10-CM

## 2012-05-11 DIAGNOSIS — F1211 Cannabis abuse, in remission: Secondary | ICD-10-CM | POA: Insufficient documentation

## 2012-05-11 DIAGNOSIS — I079 Rheumatic tricuspid valve disease, unspecified: Secondary | ICD-10-CM | POA: Insufficient documentation

## 2012-05-11 DIAGNOSIS — I059 Rheumatic mitral valve disease, unspecified: Secondary | ICD-10-CM | POA: Insufficient documentation

## 2012-05-11 DIAGNOSIS — I5020 Unspecified systolic (congestive) heart failure: Secondary | ICD-10-CM | POA: Insufficient documentation

## 2012-05-11 DIAGNOSIS — I428 Other cardiomyopathies: Secondary | ICD-10-CM | POA: Insufficient documentation

## 2012-05-11 DIAGNOSIS — B20 Human immunodeficiency virus [HIV] disease: Secondary | ICD-10-CM | POA: Insufficient documentation

## 2012-05-11 DIAGNOSIS — F1011 Alcohol abuse, in remission: Secondary | ICD-10-CM | POA: Insufficient documentation

## 2012-05-11 NOTE — Progress Notes (Signed)
Echocardiogram performed.  

## 2012-07-25 ENCOUNTER — Telehealth: Payer: Self-pay | Admitting: *Deleted

## 2012-07-25 NOTE — Telephone Encounter (Signed)
Patient called reporting "swelling and tenderness" in right knee, moved down to his right foot lasting 1 week.  Patient reported this has "happened before" and requests an appointment. Appointment offered for 07/26/12, but patient would not have transportation that afternoon. Appointment given for 07/27/12 at 9:45am. Patient advised that if he needs to be seen sooner or if his condition exacerbates to go to the ED or call his cardiac doctor, Dr. Ladona Ridgel. Patient verbalized understanding and agreement with this plan.

## 2012-07-27 ENCOUNTER — Encounter: Payer: Self-pay | Admitting: Infectious Disease

## 2012-07-27 ENCOUNTER — Other Ambulatory Visit: Payer: Self-pay | Admitting: Infectious Disease

## 2012-07-27 ENCOUNTER — Telehealth: Payer: Self-pay | Admitting: *Deleted

## 2012-07-27 ENCOUNTER — Ambulatory Visit (INDEPENDENT_AMBULATORY_CARE_PROVIDER_SITE_OTHER): Payer: Medicare Other | Admitting: Infectious Disease

## 2012-07-27 ENCOUNTER — Other Ambulatory Visit: Payer: Self-pay | Admitting: *Deleted

## 2012-07-27 VITALS — BP 165/122 | HR 73 | Temp 98.3°F | Wt 163.0 lb

## 2012-07-27 DIAGNOSIS — I82401 Acute embolism and thrombosis of unspecified deep veins of right lower extremity: Secondary | ICD-10-CM

## 2012-07-27 DIAGNOSIS — B20 Human immunodeficiency virus [HIV] disease: Secondary | ICD-10-CM

## 2012-07-27 DIAGNOSIS — E785 Hyperlipidemia, unspecified: Secondary | ICD-10-CM

## 2012-07-27 DIAGNOSIS — R3589 Other polyuria: Secondary | ICD-10-CM

## 2012-07-27 DIAGNOSIS — R609 Edema, unspecified: Secondary | ICD-10-CM | POA: Diagnosis not present

## 2012-07-27 DIAGNOSIS — R358 Other polyuria: Secondary | ICD-10-CM | POA: Diagnosis not present

## 2012-07-27 DIAGNOSIS — R600 Localized edema: Secondary | ICD-10-CM | POA: Insufficient documentation

## 2012-07-27 DIAGNOSIS — E119 Type 2 diabetes mellitus without complications: Secondary | ICD-10-CM | POA: Diagnosis not present

## 2012-07-27 DIAGNOSIS — I509 Heart failure, unspecified: Secondary | ICD-10-CM | POA: Diagnosis not present

## 2012-07-27 LAB — HEMOGLOBIN A1C: Hgb A1c MFr Bld: 5.8 % — ABNORMAL HIGH (ref <5.7)

## 2012-07-27 LAB — COMPLETE METABOLIC PANEL WITH GFR
Albumin: 4.3 g/dL (ref 3.5–5.2)
Alkaline Phosphatase: 74 U/L (ref 39–117)
BUN: 12 mg/dL (ref 6–23)
Calcium: 9.6 mg/dL (ref 8.4–10.5)
GFR, Est Non African American: 78 mL/min
Glucose, Bld: 85 mg/dL (ref 70–99)
Potassium: 4.4 mEq/L (ref 3.5–5.3)

## 2012-07-27 LAB — CBC WITH DIFFERENTIAL/PLATELET
Eosinophils Absolute: 0.2 10*3/uL (ref 0.0–0.7)
Lymphocytes Relative: 37 % (ref 12–46)
Lymphs Abs: 2.4 10*3/uL (ref 0.7–4.0)
MCH: 28.3 pg (ref 26.0–34.0)
Neutro Abs: 3.4 10*3/uL (ref 1.7–7.7)
Neutrophils Relative %: 51 % (ref 43–77)
Platelets: 380 10*3/uL (ref 150–400)
RBC: 5.01 MIL/uL (ref 4.22–5.81)
WBC: 6.5 10*3/uL (ref 4.0–10.5)

## 2012-07-27 LAB — LIPID PANEL
Cholesterol: 144 mg/dL (ref 0–200)
Total CHOL/HDL Ratio: 2.7 Ratio

## 2012-07-27 NOTE — Progress Notes (Signed)
Subjective:    Patient ID: [name], male    DOB: [birth year], 47 y.o.   MRN: [id number]  Knee Pain   Foot Pain Associated symptoms include joint swelling and myalgias. Pertinent negatives include no abdominal pain, arthralgias, chest pain, chills, congestion, coughing, diaphoresis, fatigue, fever, nausea, rash, sore throat, vomiting or weakness.    [name] is a 47 y.o. male with HIV infection who is doing superbly well on his salvage antiviral regimen,etravirine, Prezista with Norvir and Truvada.  with undetectable viral load and health cd4 count when last checked.    He him today with an acute complaint of swelling in his right knee that began 2 days ago. Swelling worsened and he then developed swelling in his right calf and now he has pain and swelling in his right foot. He has no history previously of deep venous thrombosis or hypercoagulable state. He has had his no history of trauma to the area.  We are endeavoring to obtain a stat Doppler to rule out a deep venous thrombosis.  We spent greater than 45 minutes with the patient including greater than 50% of time in face to face counsel of the patient and in coordination of their care.   Review of Systems  Constitutional: Negative for fever, chills, diaphoresis, activity change, appetite change, fatigue and unexpected weight change.  HENT: Negative for congestion, sore throat, rhinorrhea, sneezing, trouble swallowing and sinus pressure.   Eyes: Negative for photophobia and visual disturbance.  Respiratory: Negative for cough, chest tightness, shortness of breath, wheezing and stridor.   Cardiovascular: Negative for chest pain, palpitations and leg swelling.  Gastrointestinal: Negative for nausea, vomiting, abdominal pain, diarrhea, constipation, blood in stool, abdominal distention and anal bleeding.  Genitourinary: Negative for dysuria, hematuria, flank pain and difficulty urinating.  Musculoskeletal: Positive for  myalgias and joint swelling. Negative for back pain, arthralgias and gait problem.  Skin: Negative for color change, pallor, rash and wound.  Neurological: Negative for dizziness, tremors, weakness and light-headedness.  Hematological: Negative for adenopathy. Does not bruise/bleed easily.  Psychiatric/Behavioral: Negative for behavioral problems, confusion, sleep disturbance, dysphoric mood, decreased concentration and agitation.       Objective:   Physical Exam  Constitutional: He is oriented to person, place, and time. He appears well-developed and well-nourished. No distress.  HENT:  Head: Normocephalic and atraumatic.  Mouth/Throat: Oropharynx is clear and moist. No oropharyngeal exudate.  Eyes: Conjunctivae and EOM are normal. Pupils are equal, round, and reactive to light. No scleral icterus.  Neck: Normal range of motion. Neck supple. No JVD present.  Cardiovascular: Normal rate, regular rhythm and normal heart sounds.  Exam reveals no gallop and no friction rub.   No murmur heard. Pulmonary/Chest: Effort normal and breath sounds normal. No respiratory distress. He has no wheezes. He has no rales. He exhibits no tenderness.  Abdominal: He exhibits no distension and no mass. There is no tenderness. There is no rebound and no guarding.  Musculoskeletal: He exhibits no edema and no tenderness.       Right knee: He exhibits swelling and effusion.       Legs: Lymphadenopathy:    He has no cervical adenopathy.  Neurological: He is alert and oriented to person, place, and time. He has normal reflexes. He exhibits normal muscle tone. Coordination normal.  Skin: Skin is warm and dry. He is not diaphoretic. No erythema. No pallor.  Psychiatric: He has a normal mood and affect. His behavior is normal. Judgment and thought content  normal.          Assessment & Plan:  Right knee, now leg, foot swelling: check .Doppler study. This could be DVT possibly. Could be ruptured Baker's cyst.    HIV: Recheck his labs today as a followup appointment with Dr. Luciana Axe in a few weeks' time.  CHF with ef 20-25%. He also had hx of wide complex tachycardia not inducible in EP lab: --he needs fu with Dr. Ladona Ridgel  And he needs PCP other than Dr. Luciana Axe to help manage non-HIV issues   Polyuria: he asked to be checked for diabetes and I ordered A1c

## 2012-07-27 NOTE — Telephone Encounter (Signed)
Patient scheduled for scan 10 am 07/28/12 at [facility]. Advised him to go to admitting by 930 am in the [place] tower to register and they will get him up to the radiology department. Gave them [phone] as the call back number.

## 2012-07-28 ENCOUNTER — Ambulatory Visit (HOSPITAL_COMMUNITY)
Admission: RE | Admit: 2012-07-28 | Discharge: 2012-07-28 | Disposition: A | Discharge status: Home / Self Care | Payer: Medicare Other | Source: Ambulatory Visit | Attending: Infectious Disease | Admitting: Infectious Disease

## 2012-07-28 DIAGNOSIS — R600 Localized edema: Secondary | ICD-10-CM

## 2012-07-28 DIAGNOSIS — M79609 Pain in unspecified limb: Secondary | ICD-10-CM | POA: Insufficient documentation

## 2012-07-28 DIAGNOSIS — I82401 Acute embolism and thrombosis of unspecified deep veins of right lower extremity: Secondary | ICD-10-CM

## 2012-07-28 DIAGNOSIS — R609 Edema, unspecified: Secondary | ICD-10-CM | POA: Insufficient documentation

## 2012-07-28 DIAGNOSIS — I509 Heart failure, unspecified: Secondary | ICD-10-CM | POA: Insufficient documentation

## 2012-07-28 NOTE — Progress Notes (Signed)
Right:  No evidence of DVT, superficial thrombosis, or Baker's cyst.  Left:  Negative for DVT in the common femoral vein.  

## 2012-07-29 LAB — HIV-1 RNA QUANT-NO REFLEX-BLD
HIV 1 RNA Quant: <20 copies/mL (ref <20)
HIV-1 RNA Quant, Log: <1.3 {Log} (ref <1.30)

## 2012-08-01 ENCOUNTER — Telehealth: Payer: Self-pay | Admitting: *Deleted

## 2012-08-01 NOTE — Telephone Encounter (Signed)
Patient called for results of last week's doppler.  Results of that test (normal) were communicated to him.  Patient notified RN that he is feeling much better, that the swelling "is gone," and he "is getting around like nothing."  Patient will be back for his regular appointment 08/18/12 with Dr. Lennox Laity, Zachary George, RN

## 2012-08-04 ENCOUNTER — Other Ambulatory Visit: Payer: Medicare Other

## 2012-08-18 ENCOUNTER — Encounter: Payer: Self-pay | Admitting: Internal Medicine

## 2012-08-18 ENCOUNTER — Ambulatory Visit (INDEPENDENT_AMBULATORY_CARE_PROVIDER_SITE_OTHER): Payer: Medicare Other | Admitting: Internal Medicine

## 2012-08-18 VITALS — BP 167/120 | HR 80 | Temp 98.0°F | Ht 64.5 in | Wt 166.0 lb

## 2012-08-18 DIAGNOSIS — B1710 Acute hepatitis C without hepatic coma: Secondary | ICD-10-CM | POA: Diagnosis not present

## 2012-08-18 DIAGNOSIS — B20 Human immunodeficiency virus [HIV] disease: Secondary | ICD-10-CM

## 2012-08-18 DIAGNOSIS — I10 Essential (primary) hypertension: Secondary | ICD-10-CM | POA: Diagnosis not present

## 2012-08-18 DIAGNOSIS — K219 Gastro-esophageal reflux disease without esophagitis: Secondary | ICD-10-CM

## 2012-08-18 MED ORDER — OMEPRAZOLE 20 MG PO CPDR: 20.0000 mg | DELAYED_RELEASE_CAPSULE | Freq: Every day | ORAL | Status: DC

## 2012-08-19 DIAGNOSIS — K219 Gastro-esophageal reflux disease without esophagitis: Secondary | ICD-10-CM | POA: Insufficient documentation

## 2012-08-19 NOTE — Assessment & Plan Note (Signed)
Will refill prilosec 

## 2012-08-19 NOTE — Assessment & Plan Note (Signed)
He was again counseled on avoidance of alcohol. No signs of cirrhosis with normal platelets, normal albumin.

## 2012-08-19 NOTE — Progress Notes (Signed)
  Subjective:    Patient ID: [name], male    DOB: 1965-09-18, 47 y.o.   MRN: [id number]  HPI Here for routine follow up. Continues on salvage regimen of Etravirine, darunavir/rit, Truvada and reports excellent compliance with no missed doses.  Remains undetectable.  Had swelling of his knee and negative for DVT, resolved.  He had a recent echo with a stable EF.  He continues to drink beer daily.  He feels well with no chest pain, sob or leg edema.  BP is high and his lisinopril was reduced to 20 mg previously.     Review of Systems  Constitutional: Negative for diaphoresis, fatigue and unexpected weight change.  HENT: Negative for sore throat and trouble swallowing.   Respiratory: Negative for cough and shortness of breath.   Cardiovascular: Negative for chest pain, palpitations and leg swelling.  Gastrointestinal: Negative for nausea, abdominal pain and diarrhea.  Musculoskeletal: Negative for myalgias, joint swelling and arthralgias.  Skin: Negative for rash.  Neurological: Negative for dizziness, light-headedness and headaches.  Psychiatric/Behavioral: The patient is not nervous/anxious.        Objective:   Physical Exam  Constitutional: He appears well-developed and well-nourished. No distress.  HENT:  Mouth/Throat: Oropharynx is clear and moist. No oropharyngeal exudate.  Cardiovascular: Normal rate, regular rhythm and normal heart sounds.  Exam reveals no gallop and no friction rub.   No murmur heard. Pulmonary/Chest: Effort normal and breath sounds normal. No respiratory distress. He has no wheezes. He has no rales.  Abdominal: Soft. Bowel sounds are normal. He exhibits no distension. There is no tenderness. There is no rebound.  Lymphadenopathy:    He has no cervical adenopathy.  Skin: No rash noted.          Assessment & Plan:

## 2012-08-19 NOTE — Assessment & Plan Note (Signed)
Elevated.  He is going to make an appointment with his cardiologist today.

## 2012-08-19 NOTE — Assessment & Plan Note (Signed)
He continues to do well on a salvage regimen and withRTC in 3 months.

## 2012-08-22 ENCOUNTER — Telehealth: Payer: Self-pay | Admitting: *Deleted

## 2012-08-22 NOTE — Telephone Encounter (Signed)
Patient called inquiring about his "diabetes test results."  HgbA1C result given, patient advised about diet, exercise modifications to keep from becoming diabetic.  Patient asked if Dr. Luciana Axe would be his diabetic doctor, I offered to refer him for PCP care.  Will refer him to [facility]. Andree Coss, RN

## 2012-08-23 ENCOUNTER — Ambulatory Visit (INDEPENDENT_AMBULATORY_CARE_PROVIDER_SITE_OTHER): Payer: Medicare Other | Admitting: Physician Assistant

## 2012-08-23 ENCOUNTER — Encounter: Payer: Self-pay | Admitting: Physician Assistant

## 2012-08-23 VITALS — BP 160/118 | HR 98 | Ht 64.5 in | Wt 164.8 lb

## 2012-08-23 DIAGNOSIS — I428 Other cardiomyopathies: Secondary | ICD-10-CM

## 2012-08-23 DIAGNOSIS — R0602 Shortness of breath: Secondary | ICD-10-CM

## 2012-08-23 DIAGNOSIS — I509 Heart failure, unspecified: Secondary | ICD-10-CM

## 2012-08-23 DIAGNOSIS — I5022 Chronic systolic (congestive) heart failure: Secondary | ICD-10-CM | POA: Diagnosis not present

## 2012-08-23 DIAGNOSIS — I10 Essential (primary) hypertension: Secondary | ICD-10-CM

## 2012-08-23 DIAGNOSIS — F1010 Alcohol abuse, uncomplicated: Secondary | ICD-10-CM

## 2012-08-23 LAB — BASIC METABOLIC PANEL
BUN: 11 mg/dL (ref 6–23)
CO2: 24 mEq/L (ref 19–32)
Calcium: 9.7 mg/dL (ref 8.4–10.5)
GFR: 108.29 mL/min (ref >60.00)
Glucose, Bld: 113 mg/dL — ABNORMAL HIGH (ref 70–99)
Sodium: 141 mEq/L (ref 135–145)

## 2012-08-23 MED ORDER — LISINOPRIL 20 MG PO TABS: 20.0000 mg | ORAL_TABLET | Freq: Two times a day (BID) | ORAL | Status: DC

## 2012-08-23 NOTE — Patient Instructions (Addendum)
Your physician has recommended you make the following change in your medication: INCREASE your Lisinopril to 20 mg twice daily  Your physician recommends that you return for lab work in: today (bmet, bnp) and in one week (bmet)  Your physician recommends that you schedule a follow-up appointment in: 1-2 weeks with Dr Antoine Poche or Tereso Newcomer  You have been referred back to Dr Ladona Ridgel

## 2012-08-23 NOTE — Progress Notes (Signed)
[address], [address] [place], [place]  [address] Phone: [phone] Fax:  [phone]  Date:  08/23/2012   ID:  [name], DOB 12-31-1965, MRN [id number]  PCP:  [name], MD  Primary Cardiologist:  .Dr. Rollene Rotunda   Primary Electrophysiologist:  Dr. Lewayne Bunting    History of Present Illness: [name] is a 47 y.o. male who returns for follow up.  He has a h/o HIV, HTN, G6PD deficiency, HBV, HCV, NICM, systolic CHF.  EF in the past has been 20-25%.  Coronary CT 12/2011:  No CAD, Ca score 1.59.  He has a history of syncope.  Event monitor demonstrated wide-complex tachycardia and he was referred to Dr. Ladona Ridgel.  He underwent EP study with Dr. Ladona Ridgel 9/13 that demonstrated no inducible atrial or ventricular arrhythmias. Last seen by Dr. Ladona Ridgel in 01/2012. Recommendation was to proceed with followup echocardiogram in 3 months to decide whether or not he qualify for prophylactic ICD implantation.  He did have Echo 05/11/2012:  Mild LVH, EF 20-25%, Gr 1 diast dysfn, mod MR, mild LAE.  He has not heard back from his f/u echo.  His BP has been running higher.  He did have some recent RLE edema.  Dopplers were neg for DVT.  He denies CP, dyspnea, syncope, orthopnea, PND, edema.  Does note increased abdominal girth.  He has not taken K+ in 1 month.  He continues to drink "a lot" of beer daily.  Admits to > 6 beers per day.  States he does not want to quit.   Labs (2/14):  K 4.4, Cr 1.11, ALT 38, LDL 61, Hgb 14.2, A1c 5.8  Wt Readings from Last 3 Encounters:  08/23/12 164 lb 12.8 oz (74.753 kg)  08/18/12 166 lb (75.297 kg)  07/27/12 163 lb (73.936 kg)     Past Medical History  Diagnosis Date  . Hypertension   . HIV infection   . Hepatitis     Hep B and Hep C (patient does not report this but these are listed in previous notes.)  . G6PD deficiency   . Chronic systolic heart failure     a. Echo 12/05/11:  EF 20-25%, diff HK with mild sparing of IL wall, mild  AI, mod MR, mild LAE, mild RVE, mild reduced RVF.;  b.  Echo 05/11/2012:  Mild LVH, EF 20-25%, Gr 1 diast dysfn, mod MR, mild LAE  . NICM (nonischemic cardiomyopathy)     cardia CTA 8/13 negative for obstructive CAD    Current Outpatient Prescriptions  Medication Sig Dispense Refill  . carvedilol (COREG) 25 MG tablet Take 1 tablet (25 mg total) by mouth 2 (two) times daily with a meal.  60 tablet  11  . darunavir (PREZISTA) 800 MG tablet Take 1 tablet (800 mg total) by mouth daily with breakfast.  30 tablet  6  . emtricitabine-tenofovir (TRUVADA) 200-300 MG per tablet Take 1 tablet by mouth daily.  30 tablet  6  . etravirine (INTELENCE) 100 MG tablet Take 2 tablets (200 mg total) by mouth 2 (two) times daily with a meal.  120 tablet  6  . furosemide (LASIX) 20 MG tablet Take 20 mg twice a day  60 tablet  6  . lisinopril (PRINIVIL,ZESTRIL) 20 MG tablet Take 1 tablet (20 mg total) by mouth daily.  30 tablet  6  . omeprazole (PRILOSEC) 20 MG capsule Take 1 capsule (20 mg total) by mouth daily.  30 capsule  11  .  ritonavir (NORVIR) 100 MG capsule Take 1 capsule (100 mg total) by mouth 2 (two) times daily.  60 capsule  6  . valACYclovir (VALTREX) 1000 MG tablet Take 1 tablet (1,000 mg total) by mouth 2 (two) times daily. Take for 5 days for an outbreak  30 tablet  3   No current facility-administered medications for this visit.    Allergies:    Allergies  Allergen Reactions  . Bactrim Rash    Social History:  The patient  reports that he has never smoked. He has never used smokeless tobacco. He reports that he drinks about 3.5 ounces of alcohol per week. He reports that he uses illicit drugs about 7 times per week.   ROS:  Please see the history of present illness.   He feels fatigued.   All other systems reviewed and negative.   PHYSICAL EXAM: VS:  BP 160/118  Pulse 98  Ht 5' 4.5" (1.638 m)  Wt 164 lb 12.8 oz (74.753 kg)  BMI 27.86 kg/m2 Repeat BP by me 140/110 on left  Well  nourished, well developed, in no acute distress HEENT: normal Neck: no JVD Cardiac:  normal S1, S2; RRR; no murmur; + S3 Lungs:  clear to auscultation bilaterally, no wheezing, rhonchi or rales Abd: protuberant but soft, nontender, no hepatomegaly; no hyper-resonance to percussion Ext: no edema Skin: warm and dry Neuro:  CNs 2-12 intact, no focal abnormalities noted  EKG:  NSR, HR 98, LBBB     ASSESSMENT AND PLAN:  1. Chronic Systolic CHF:  Volume appears stable.  Suspect weight gain related to excessive calories from ETOH.  Check BMET and BNP today.   2. Non-Ischemic Cardiomyopathy:  EF did not improve above 35%.  I will refer him back to Dr. Lewayne Bunting to evaluate for possible AICD implantation.  With his continued ETOH abuse, I am not certain he is a candidate for ICD implantation.  But I will leave that up to Dr. Lubertha Basque discretion.   3. Hypertension:  Uncontrolled.  Increase Lisinopril to 20 mg bid.  Check a bmet in 1 week. 4. ETOH Abuse:  We discussed the importance of ETOH cessation. 5. Disposition:  F/u with me in 1 week.   Luna Glasgow, PA-C  12:24 PM 08/23/2012

## 2012-08-25 NOTE — Progress Notes (Signed)
N/A.  LMTC. 

## 2012-08-30 ENCOUNTER — Other Ambulatory Visit (INDEPENDENT_AMBULATORY_CARE_PROVIDER_SITE_OTHER): Payer: Medicare Other

## 2012-08-30 DIAGNOSIS — R0602 Shortness of breath: Secondary | ICD-10-CM | POA: Diagnosis not present

## 2012-08-30 DIAGNOSIS — I509 Heart failure, unspecified: Secondary | ICD-10-CM | POA: Diagnosis not present

## 2012-08-30 DIAGNOSIS — I10 Essential (primary) hypertension: Secondary | ICD-10-CM | POA: Diagnosis not present

## 2012-08-30 LAB — BASIC METABOLIC PANEL
BUN: 20 mg/dL (ref 6–23)
CO2: 24 mEq/L (ref 19–32)
Calcium: 9.5 mg/dL (ref 8.4–10.5)
Chloride: 105 mEq/L (ref 96–112)
Creatinine, Ser: 1.1 mg/dL (ref 0.4–1.5)

## 2012-08-31 ENCOUNTER — Telehealth: Payer: Self-pay | Admitting: *Deleted

## 2012-08-31 NOTE — Telephone Encounter (Signed)
Message copied by Tarri Fuller on Wed Aug 31, 2012  5:08 PM ------      Message from: [place], [place] T      Created: Wed Aug 31, 2012  2:03 PM       Potassium and kidney function look good.      Continue with current treatment plan.      Tereso Newcomer, PA-C  4:49 PM 04/14/2012 ------

## 2012-08-31 NOTE — Telephone Encounter (Signed)
lmom labs ok, no changes to be made 

## 2012-09-01 ENCOUNTER — Encounter: Payer: Self-pay | Admitting: Physician Assistant

## 2012-09-01 ENCOUNTER — Ambulatory Visit (INDEPENDENT_AMBULATORY_CARE_PROVIDER_SITE_OTHER): Payer: Medicare Other | Admitting: Physician Assistant

## 2012-09-01 VITALS — BP 140/102 | HR 93 | Ht 64.5 in | Wt 165.8 lb

## 2012-09-01 DIAGNOSIS — I10 Essential (primary) hypertension: Secondary | ICD-10-CM | POA: Diagnosis not present

## 2012-09-01 DIAGNOSIS — I5022 Chronic systolic (congestive) heart failure: Secondary | ICD-10-CM

## 2012-09-01 DIAGNOSIS — F1010 Alcohol abuse, uncomplicated: Secondary | ICD-10-CM

## 2012-09-01 DIAGNOSIS — I428 Other cardiomyopathies: Secondary | ICD-10-CM

## 2012-09-01 NOTE — Patient Instructions (Signed)
PLEASE SCHEDULE A NURSE VISIT FOR BP CHECK; BLOOD PRESSURE CHECK; PT ADVISED TO TAKE BP MEDS 2-3 HOURS BEFORE APPT PER SCOTT WEAVER, PAC  NO CHANGES WERE MADE TODAY  PLEASE MAKE A F/U APPT WITH DR. Ladona Ridgel ABOUT POSSIBLE ICD  PLEASER FOLLOW UP WITH DR. HOHCREIN IN 6-8 WEEKS

## 2012-09-01 NOTE — Progress Notes (Signed)
[address] [place], [place]  [address] Phone: [phone] Fax:  [phone]  Date:  09/01/2012   ID:  [name], DOB 01/29/1966, MRN [id number]  PCP:  [name], MD  Primary Cardiologist:  .Dr. Rollene Rotunda   Primary Electrophysiologist:  Dr. Lewayne Bunting    History of Present Illness: [name] is a 47 y.o. male who returns for follow up.  He has a hx of HIV, HTN, G6PD deficiency, HBV, HCV, NICM, systolic CHF, ETOH abuse.  EF in the past has been 20-25%.  Coronary CT 12/2011:  No CAD, Ca score 1.59.  He has a history of syncope.  Event monitor demonstrated wide-complex tachycardia and he was referred to Dr. Ladona Ridgel.  He underwent EP study with Dr. Ladona Ridgel 9/13 that demonstrated no inducible atrial or ventricular arrhythmias. Last seen by Dr. Ladona Ridgel in 01/2012. Recommendation was to proceed with followup echocardiogram in 3 months to decide whether or not he qualify for prophylactic ICD implantation.  Echo 05/11/2012:  Mild LVH, EF 20-25%, Gr 1 diast dysfn, mod MR, mild LAE.  I saw him recently and adjusted his ACE for BP control.  The patient denies chest pain, shortness of breath, syncope, orthopnea, PND or significant pedal edema.   Labs (2/14):  K 4.4, Cr 1.11, ALT 38, LDL 61, Hgb 14.2, A1c 5.8 Labs (3/14):  K 3.9=>4.3, Cr 1.0=>1.1, proBNP 173  Wt Readings from Last 3 Encounters:  09/01/12 165 lb 12.8 oz (75.206 kg)  08/23/12 164 lb 12.8 oz (74.753 kg)  08/18/12 166 lb (75.297 kg)     Past Medical History  Diagnosis Date  . Hypertension   . HIV infection   . Hepatitis     Hep B and Hep C (patient does not report this but these are listed in previous notes.)  . G6PD deficiency   . Chronic systolic heart failure     a. Echo 12/05/11:  EF 20-25%, diff HK with mild sparing of IL wall, mild AI, mod MR, mild LAE, mild RVE, mild reduced RVF.;  b.  Echo 05/11/2012:  Mild LVH, EF 20-25%, Gr 1 diast dysfn, mod MR, mild LAE  . NICM (nonischemic  cardiomyopathy)     cardia CTA 8/13 negative for obstructive CAD    Current Outpatient Prescriptions  Medication Sig Dispense Refill  . carvedilol (COREG) 25 MG tablet Take 1 tablet (25 mg total) by mouth 2 (two) times daily with a meal.  60 tablet  11  . darunavir (PREZISTA) 800 MG tablet Take 1 tablet (800 mg total) by mouth daily with breakfast.  30 tablet  6  . emtricitabine-tenofovir (TRUVADA) 200-300 MG per tablet Take 1 tablet by mouth daily.  30 tablet  6  . etravirine (INTELENCE) 100 MG tablet Take 2 tablets (200 mg total) by mouth 2 (two) times daily with a meal.  120 tablet  6  . furosemide (LASIX) 20 MG tablet Take 20 mg twice a day  60 tablet  6  . lisinopril (PRINIVIL,ZESTRIL) 20 MG tablet Take 1 tablet (20 mg total) by mouth 2 (two) times daily.  60 tablet  6  . omeprazole (PRILOSEC) 20 MG capsule Take 1 capsule (20 mg total) by mouth daily.  30 capsule  11  . ritonavir (NORVIR) 100 MG capsule Take 1 capsule (100 mg total) by mouth 2 (two) times daily.  60 capsule  6  . valACYclovir (VALTREX) 1000 MG tablet Take 1 tablet (1,000 mg total) by mouth 2 (  two) times daily. Take for 5 days for an outbreak  30 tablet  3   No current facility-administered medications for this visit.    Allergies:    Allergies  Allergen Reactions  . Bactrim Rash    Social History:  The patient  reports that he has never smoked. He has never used smokeless tobacco. He reports that he drinks about 25.2 ounces of alcohol per week. He reports that he uses illicit drugs about 7 times per week.   ROS:  Please see the history of present illness.   All other systems reviewed and negative.   PHYSICAL EXAM: VS:  BP 140/102  Pulse 93  Ht 5' 4.5" (1.638 m)  Wt 165 lb 12.8 oz (75.206 kg)  BMI 28.03 kg/m2  Well nourished, well developed, in no acute distress HEENT: normal Neck: no JVD Cardiac:  normal S1, S2; RRR; no murmur; + S4 Lungs:  clear to auscultation bilaterally, no wheezing, rhonchi or  rales Abd: protuberant but soft, nontender, no hepatomegaly Ext: no edema Skin: warm and dry Neuro:  CNs 2-12 intact, no focal abnormalities noted  EKG:  NSR, HR 93, LBBB     ASSESSMENT AND PLAN:  1. Chronic Systolic CHF:  Volume appears stable.  Continue current dose of Lasix.   2. Non-Ischemic Cardiomyopathy:  EF did not improve above 35%.  I have set him up with f/u with Dr. Lewayne Bunting to evaluate for possible AICD implantation.  With his continued ETOH abuse, I am not certain he is a candidate for ICD implantation.  But I will leave that up to Dr. Lubertha Basque discretion.   3. Hypertension:  Uncontrolled.  But, he has not taken his ACE or beta blocker today.  I will have him return for BP check with the RN after taking his medication.  Consider spironolactone vs hydralazine if BP remains high. 4. ETOH Abuse:  He understands the importance of ETOH cessation. 5. Disposition:  F/u with Dr. Lewayne Bunting as planned and with Dr. Rollene Rotunda in 6-8 weeks.   Signed, Tereso Newcomer, PA-C  1:56 PM 09/01/2012

## 2012-09-14 ENCOUNTER — Ambulatory Visit: Payer: Medicare Other

## 2012-10-03 ENCOUNTER — Telehealth: Payer: Self-pay | Admitting: Internal Medicine

## 2012-10-03 NOTE — Telephone Encounter (Signed)
Dr Antoine Poche is his primary cardiologist.  Will forward to triage

## 2012-10-03 NOTE — Telephone Encounter (Signed)
New problem    Pt experiencing numbness in right hand/arm then it spread to the right side of his face

## 2012-10-03 NOTE — Telephone Encounter (Signed)
Sunday 10/02/12, pt had an episode lasting 30 minutes where his right hand felt numb then it went up his right arm to his face, his lips and tongue were numb. Pt stated his heart ached and felt nauseated. Pt states he was just sitting at the time. Pt states he is feeling well today, no further complaint. Pt requests call back tomorrow from nurse, I will forward to Dr/Nurse.

## 2012-10-04 NOTE — Telephone Encounter (Signed)
Spoke with pt - no further s/s - states it just scared me and I thought you should know.  He is aware to keep appt as scheduled however if s/s return he is to report to be ED for evaluation.  He states understanding.

## 2012-10-13 ENCOUNTER — Ambulatory Visit (INDEPENDENT_AMBULATORY_CARE_PROVIDER_SITE_OTHER): Payer: Medicare Other | Admitting: Internal Medicine

## 2012-10-13 ENCOUNTER — Encounter: Payer: Self-pay | Admitting: Internal Medicine

## 2012-10-13 VITALS — BP 98/82 | HR 84 | Ht 64.5 in | Wt 164.1 lb

## 2012-10-13 DIAGNOSIS — I428 Other cardiomyopathies: Secondary | ICD-10-CM

## 2012-10-13 DIAGNOSIS — I509 Heart failure, unspecified: Secondary | ICD-10-CM

## 2012-10-13 DIAGNOSIS — I10 Essential (primary) hypertension: Secondary | ICD-10-CM

## 2012-10-13 DIAGNOSIS — I5022 Chronic systolic (congestive) heart failure: Secondary | ICD-10-CM

## 2012-10-13 DIAGNOSIS — R55 Syncope and collapse: Secondary | ICD-10-CM

## 2012-10-13 MED ORDER — CARVEDILOL 25 MG PO TABS: 12.5000 mg | ORAL_TABLET | Freq: Two times a day (BID) | ORAL | Status: DC

## 2012-10-13 NOTE — Assessment & Plan Note (Signed)
He has had no recurrent episodes. He will undergo watchful waiting. 

## 2012-10-13 NOTE — Assessment & Plan Note (Signed)
He has a history of hypertension but also has recently had hypotension. His blood pressure today is under 100. I've asked the patient to stop drinking alcohol, maintain a low-sodium diet, and continue his current medications as adjusted.

## 2012-10-13 NOTE — Assessment & Plan Note (Signed)
His heart failure symptoms remain class 1-2. Today he is somewhat dizzy, and I've asked the patient to reduce his dose of carvedilol to 12.5 mg twice daily. For now, he will continue his lisinopril.

## 2012-10-13 NOTE — Patient Instructions (Signed)
Your physician wants you to follow-up in: 6 months with Dr Court Joy will receive a reminder letter in the mail two months in advance. If you don't receive a letter, please call our office to schedule the follow-up appointment.  Your physician has recommended you make the following change in your medication:  1) Decrease Carvedilol to 1/2 tablet twice daily  Decrease Alcohol Consumption

## 2012-10-13 NOTE — Progress Notes (Signed)
HPI Mr. Bonura returns today for followup. He has long-standing nonischemic cardiomyopathy and chronic class 1-2 systolic heart failure. He has remote history of syncope, which predates his development of left ventricular dysfunction. He underwent EP testing several months ago demonstrated no inducible ventricular arrhythmias. He describes an episode of numbness in the right side several days ago which resolved. He also has occasional episodes of chest discomfort, not related to exertion. Finally, he notes that he has dizziness particularly in the mornings after he takes his medications. He will feel weak and fatigued for several hours. Allergies  Allergen Reactions  . Bactrim Rash     Current Outpatient Prescriptions  Medication Sig Dispense Refill  . carvedilol (COREG) 25 MG tablet Take 1 tablet (25 mg total) by mouth 2 (two) times daily with a meal.  60 tablet  11  . darunavir (PREZISTA) 800 MG tablet Take 1 tablet (800 mg total) by mouth daily with breakfast.  30 tablet  6  . emtricitabine-tenofovir (TRUVADA) 200-300 MG per tablet Take 1 tablet by mouth daily.  30 tablet  6  . etravirine (INTELENCE) 100 MG tablet Take 2 tablets (200 mg total) by mouth 2 (two) times daily with a meal.  120 tablet  6  . furosemide (LASIX) 20 MG tablet Take 20 mg twice a day  60 tablet  6  . lisinopril (PRINIVIL,ZESTRIL) 20 MG tablet Take 1 tablet (20 mg total) by mouth 2 (two) times daily.  60 tablet  6  . omeprazole (PRILOSEC) 20 MG capsule Take 1 capsule (20 mg total) by mouth daily.  30 capsule  11  . ritonavir (NORVIR) 100 MG capsule Take 1 capsule (100 mg total) by mouth 2 (two) times daily.  60 capsule  6  . valACYclovir (VALTREX) 1000 MG tablet Take 1 tablet (1,000 mg total) by mouth 2 (two) times daily. Take for 5 days for an outbreak  30 tablet  3   No current facility-administered medications for this visit.     Past Medical History  Diagnosis Date  . Hypertension   . HIV infection   .  Hepatitis     Hep B and Hep C (patient does not report this but these are listed in previous notes.)  . G6PD deficiency   . Chronic systolic heart failure     a. Echo 12/05/11:  EF 20-25%, diff HK with mild sparing of IL wall, mild AI, mod MR, mild LAE, mild RVE, mild reduced RVF.;  b.  Echo 05/11/2012:  Mild LVH, EF 20-25%, Gr 1 diast dysfn, mod MR, mild LAE  . NICM (nonischemic cardiomyopathy)     cardia CTA 8/13 negative for obstructive CAD    ROS:   All systems reviewed and negative except as noted in the HPI.   Past Surgical History  Procedure Laterality Date  . Finger surgery      Thumb laceration.    . Esophagogastroduodenoscopy  12/07/2011    Procedure: ESOPHAGOGASTRODUODENOSCOPY (EGD);  Surgeon: [name], MD,FACG;  Location: [facility] ENDOSCOPY;  Service: Endoscopy;  Laterality: N/A;     Family History  Problem Relation Age of Onset  . Cancer Mother      History   Social History  . Marital Status: Single    Spouse Name: N/A    Number of Children: N/A  . Years of Education: N/A   Occupational History  . Not on file.   Social History Main Topics  . Smoking status: Never Smoker   . Smokeless tobacco:  Never Used  . Alcohol Use: 25.2 oz/week    42 Cans of beer per week     Comment: socially  . Drug Use: 7.00 per week     Comment: marijuana daily  . Sexually Active: Yes -- Male partner(s)     Comment: 28 year relationship, declined condoms   Other Topics Concern  . Not on file   Social History Narrative   Lives alone.  Drinks beer daily.  Liquor rarely.  He occasionally smokes marijuana..     BP 98/82  Pulse 84  Ht 5' 4.5" (1.638 m)  Wt 164 lb 1.9 oz (74.444 kg)  BMI 27.75 kg/m2  SpO2 97%  Physical Exam:  Well appearing middle-aged man, NAD HEENT: Unremarkable Neck:  7 cm JVD, no thyromegally Back:  No CVA tenderness Lungs:  Clear with no wheezes, rales, or rhonchi. HEART:  Regular rate rhythm, no murmurs, no rubs, no clicks, split S2 Abd:   soft, positive bowel sounds, no organomegally, no rebound, no guarding Ext:  2 plus pulses, no edema, no cyanosis, no clubbing Skin:  No rashes no nodules Neuro:  CN II through XII intact, motor grossly intact  EKG - normal sinus rhythm with left bundle branch block, QRS duration 146   Assess/Plan:

## 2012-11-01 ENCOUNTER — Other Ambulatory Visit: Payer: Self-pay | Admitting: Licensed Clinical Social Worker

## 2012-11-01 DIAGNOSIS — I10 Essential (primary) hypertension: Secondary | ICD-10-CM

## 2012-11-01 MED ORDER — LISINOPRIL 20 MG PO TABS: 20.0000 mg | ORAL_TABLET | Freq: Two times a day (BID) | ORAL | Status: DC

## 2012-11-23 ENCOUNTER — Emergency Department (HOSPITAL_COMMUNITY): Payer: No Typology Code available for payment source

## 2012-11-23 ENCOUNTER — Encounter (HOSPITAL_COMMUNITY): Payer: Self-pay | Admitting: *Deleted

## 2012-11-23 ENCOUNTER — Emergency Department (HOSPITAL_COMMUNITY)
Admission: EM | Admit: 2012-11-23 | Discharge: 2012-11-23 | Disposition: A | Discharge status: Home / Self Care | Payer: No Typology Code available for payment source | Attending: Emergency Medicine | Admitting: Emergency Medicine

## 2012-11-23 DIAGNOSIS — S0990XA Unspecified injury of head, initial encounter: Secondary | ICD-10-CM | POA: Insufficient documentation

## 2012-11-23 DIAGNOSIS — Y92410 Unspecified street and highway as the place of occurrence of the external cause: Secondary | ICD-10-CM | POA: Insufficient documentation

## 2012-11-23 DIAGNOSIS — I5022 Chronic systolic (congestive) heart failure: Secondary | ICD-10-CM | POA: Insufficient documentation

## 2012-11-23 DIAGNOSIS — M25569 Pain in unspecified knee: Secondary | ICD-10-CM | POA: Diagnosis not present

## 2012-11-23 DIAGNOSIS — S93609A Unspecified sprain of unspecified foot, initial encounter: Secondary | ICD-10-CM | POA: Diagnosis not present

## 2012-11-23 DIAGNOSIS — Z8679 Personal history of other diseases of the circulatory system: Secondary | ICD-10-CM | POA: Insufficient documentation

## 2012-11-23 DIAGNOSIS — M79609 Pain in unspecified limb: Secondary | ICD-10-CM | POA: Diagnosis not present

## 2012-11-23 DIAGNOSIS — Z862 Personal history of diseases of the blood and blood-forming organs and certain disorders involving the immune mechanism: Secondary | ICD-10-CM | POA: Insufficient documentation

## 2012-11-23 DIAGNOSIS — I10 Essential (primary) hypertension: Secondary | ICD-10-CM | POA: Insufficient documentation

## 2012-11-23 DIAGNOSIS — S8392XA Sprain of unspecified site of left knee, initial encounter: Secondary | ICD-10-CM

## 2012-11-23 DIAGNOSIS — S99929A Unspecified injury of unspecified foot, initial encounter: Secondary | ICD-10-CM | POA: Diagnosis not present

## 2012-11-23 DIAGNOSIS — S93602A Unspecified sprain of left foot, initial encounter: Secondary | ICD-10-CM

## 2012-11-23 DIAGNOSIS — IMO0002 Reserved for concepts with insufficient information to code with codable children: Secondary | ICD-10-CM | POA: Insufficient documentation

## 2012-11-23 DIAGNOSIS — S8990XA Unspecified injury of unspecified lower leg, initial encounter: Secondary | ICD-10-CM | POA: Diagnosis not present

## 2012-11-23 DIAGNOSIS — Y939 Activity, unspecified: Secondary | ICD-10-CM | POA: Insufficient documentation

## 2012-11-23 DIAGNOSIS — Z8639 Personal history of other endocrine, nutritional and metabolic disease: Secondary | ICD-10-CM | POA: Insufficient documentation

## 2012-11-23 DIAGNOSIS — Z8619 Personal history of other infectious and parasitic diseases: Secondary | ICD-10-CM | POA: Insufficient documentation

## 2012-11-23 DIAGNOSIS — Z21 Asymptomatic human immunodeficiency virus [HIV] infection status: Secondary | ICD-10-CM | POA: Insufficient documentation

## 2012-11-23 MED ORDER — HYDROCODONE-ACETAMINOPHEN 5-325 MG PO TABS: 1.0000 | ORAL_TABLET | Freq: Four times a day (QID) | ORAL | Status: DC | PRN

## 2012-11-23 MED ORDER — OXYCODONE-ACETAMINOPHEN 5-325 MG PO TABS
1.0000 | ORAL_TABLET | Freq: Once | ORAL | Status: AC
  Administered 2012-11-23: 1 via ORAL
  Filled 2012-11-23: qty 1

## 2012-11-23 NOTE — ED Notes (Signed)
PA at bedside.
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
## 2012-11-23 NOTE — ED Notes (Signed)
Patient transported to X-ray 

## 2012-11-24 NOTE — ED Provider Notes (Signed)
Medical screening examination/treatment/procedure(s) were performed by non-physician practitioner and as supervising physician I was immediately available for consultation/collaboration.  Hudsen Fei, MD 11/24/12 0118 

## 2012-12-06 ENCOUNTER — Other Ambulatory Visit: Payer: Medicare Other

## 2012-12-06 DIAGNOSIS — B20 Human immunodeficiency virus [HIV] disease: Secondary | ICD-10-CM | POA: Diagnosis not present

## 2012-12-06 LAB — CBC WITH DIFFERENTIAL/PLATELET
Hemoglobin: 15.5 g/dL (ref 13.0–17.0)
Lymphocytes Relative: 29 % (ref 12–46)
Lymphs Abs: 1.8 10*3/uL (ref 0.7–4.0)
Monocytes Relative: 7 % (ref 3–12)
Neutro Abs: 3.8 10*3/uL (ref 1.7–7.7)
Neutrophils Relative %: 61 % (ref 43–77)
Platelets: 310 10*3/uL (ref 150–400)
RBC: 5.43 MIL/uL (ref 4.22–5.81)
WBC: 6.2 10*3/uL (ref 4.0–10.5)

## 2012-12-06 LAB — COMPLETE METABOLIC PANEL WITH GFR
BUN: 15 mg/dL (ref 6–23)
CO2: 22 mEq/L (ref 19–32)
Calcium: 9.9 mg/dL (ref 8.4–10.5)
Chloride: 105 mEq/L (ref 96–112)
Creat: 1.11 mg/dL (ref 0.50–1.35)
GFR, Est African American: >89 mL/min
Total Bilirubin: 0.8 mg/dL (ref 0.3–1.2)

## 2012-12-07 LAB — T-HELPER CELL (CD4) - (RCID CLINIC ONLY)
CD4 % Helper T Cell: 22 % — ABNORMAL LOW (ref 33–55)
CD4 T Cell Abs: 380 uL — ABNORMAL LOW (ref 400–2700)

## 2012-12-07 LAB — HIV-1 RNA QUANT-NO REFLEX-BLD
HIV 1 RNA Quant: <20 copies/mL (ref <20)
HIV-1 RNA Quant, Log: <1.3 {Log} (ref <1.30)

## 2012-12-20 ENCOUNTER — Encounter: Payer: Self-pay | Admitting: Internal Medicine

## 2012-12-20 ENCOUNTER — Ambulatory Visit (INDEPENDENT_AMBULATORY_CARE_PROVIDER_SITE_OTHER): Payer: Medicare Other | Admitting: Internal Medicine

## 2012-12-20 VITALS — BP 121/91 | HR 97 | Temp 98.4°F | Ht 64.5 in | Wt 159.0 lb

## 2012-12-20 DIAGNOSIS — Z79899 Other long term (current) drug therapy: Secondary | ICD-10-CM | POA: Diagnosis not present

## 2012-12-20 DIAGNOSIS — B20 Human immunodeficiency virus [HIV] disease: Secondary | ICD-10-CM | POA: Diagnosis not present

## 2012-12-20 DIAGNOSIS — B1710 Acute hepatitis C without hepatic coma: Secondary | ICD-10-CM | POA: Diagnosis not present

## 2012-12-20 DIAGNOSIS — I429 Cardiomyopathy, unspecified: Secondary | ICD-10-CM

## 2012-12-20 DIAGNOSIS — Z113 Encounter for screening for infections with a predominantly sexual mode of transmission: Secondary | ICD-10-CM

## 2012-12-20 DIAGNOSIS — I10 Essential (primary) hypertension: Secondary | ICD-10-CM | POA: Diagnosis not present

## 2012-12-20 DIAGNOSIS — I428 Other cardiomyopathies: Secondary | ICD-10-CM

## 2012-12-20 MED ORDER — LISINOPRIL 40 MG PO TABS: 40.0000 mg | ORAL_TABLET | Freq: Every day | ORAL | Status: DC

## 2012-12-20 NOTE — Assessment & Plan Note (Signed)
I again counseled him on avoiding alcohol. I also recommended he continue to walk and be active. I did again expressed my concern for his advanced heart failure and to be sure he complies with medications as he is doing. I spoke to continue to encourage followup with cardiology.

## 2012-12-20 NOTE — Assessment & Plan Note (Signed)
He is doing well on a salvage regimen and will continue.

## 2012-12-20 NOTE — Progress Notes (Signed)
  Subjective:    Patient ID: [name], male    DOB: 08-10-1965, 47 y.o.   MRN: [id number]  HPI He comes in for routine followup. He continues to take his salvage regimen with etravirine, Prezista, Norvir and Truvada. He denies any missed doses and continues to feel well. He has seen cardiology and is taking 40 mg of lisinopril though he has had trouble getting a refill. He is having some foot pain 2 after he injured his left ankle. He does have problems with gout occasionally as well.  He continues to drink alcohol.     Review of Systems  Constitutional: Negative for fever, appetite change, fatigue and unexpected weight change.  HENT: Negative for sore throat and trouble swallowing.   Eyes: Negative for visual disturbance.  Respiratory: Negative for cough and shortness of breath.   Cardiovascular: Negative for chest pain, palpitations and leg swelling.  Gastrointestinal: Negative for nausea, abdominal pain and diarrhea.  Musculoskeletal: Negative for myalgias, joint swelling and arthralgias.  Skin: Negative for rash.  Neurological: Negative for dizziness, light-headedness and headaches.  Hematological: Negative for adenopathy.  Psychiatric/Behavioral: Negative for dysphoric mood.       Objective:   Physical Exam  Constitutional: He appears well-developed and well-nourished. No distress.  HENT:  Mouth/Throat: Oropharynx is clear and moist. No oropharyngeal exudate.  Eyes: Right eye exhibits no discharge. Left eye exhibits no discharge. No scleral icterus.  Cardiovascular: Normal rate, regular rhythm and normal heart sounds.   No murmur heard. Pulmonary/Chest: Effort normal and breath sounds normal. No respiratory distress. He has no wheezes.  Lymphadenopathy:    He has no cervical adenopathy.  Skin: Skin is warm and dry. No rash noted.  Psychiatric: He has a normal mood and affect. His behavior is normal.          Assessment & Plan:

## 2012-12-20 NOTE — Assessment & Plan Note (Signed)
With his persistent drinking, he is not eligible for hepatitis C treatment.

## 2012-12-20 NOTE — Assessment & Plan Note (Signed)
I did refill his fosinopril

## 2013-01-19 ENCOUNTER — Emergency Department (HOSPITAL_COMMUNITY): Payer: Medicare Other

## 2013-01-19 ENCOUNTER — Encounter (HOSPITAL_COMMUNITY): Payer: Self-pay

## 2013-01-19 ENCOUNTER — Other Ambulatory Visit: Payer: Self-pay

## 2013-01-19 ENCOUNTER — Inpatient Hospital Stay (HOSPITAL_COMMUNITY)
Admission: EM | Admit: 2013-01-19 | Discharge: 2013-01-22 | DRG: 291 | Disposition: A | Discharge status: Home / Self Care | Payer: Medicare Other | Attending: Internal Medicine | Admitting: Internal Medicine

## 2013-01-19 ENCOUNTER — Ambulatory Visit (INDEPENDENT_AMBULATORY_CARE_PROVIDER_SITE_OTHER): Payer: Medicare Other | Admitting: Internal Medicine

## 2013-01-19 ENCOUNTER — Encounter (HOSPITAL_COMMUNITY): Payer: Self-pay | Admitting: *Deleted

## 2013-01-19 ENCOUNTER — Ambulatory Visit (HOSPITAL_COMMUNITY)
Admission: RE | Admit: 2013-01-19 | Discharge: 2013-01-19 | Disposition: A | Discharge status: Home / Self Care | Payer: Medicare Other | Source: Ambulatory Visit | Attending: Internal Medicine | Admitting: Internal Medicine

## 2013-01-19 ENCOUNTER — Encounter: Payer: Self-pay | Admitting: Internal Medicine

## 2013-01-19 ENCOUNTER — Telehealth: Payer: Self-pay | Admitting: *Deleted

## 2013-01-19 VITALS — BP 141/113 | HR 101 | Temp 98.3°F | Wt 156.0 lb

## 2013-01-19 DIAGNOSIS — N281 Cyst of kidney, acquired: Secondary | ICD-10-CM | POA: Diagnosis not present

## 2013-01-19 DIAGNOSIS — I5023 Acute on chronic systolic (congestive) heart failure: Principal | ICD-10-CM

## 2013-01-19 DIAGNOSIS — I5022 Chronic systolic (congestive) heart failure: Secondary | ICD-10-CM

## 2013-01-19 DIAGNOSIS — Z7982 Long term (current) use of aspirin: Secondary | ICD-10-CM

## 2013-01-19 DIAGNOSIS — R109 Unspecified abdominal pain: Secondary | ICD-10-CM

## 2013-01-19 DIAGNOSIS — I426 Alcoholic cardiomyopathy: Secondary | ICD-10-CM | POA: Diagnosis present

## 2013-01-19 DIAGNOSIS — R079 Chest pain, unspecified: Secondary | ICD-10-CM | POA: Diagnosis not present

## 2013-01-19 DIAGNOSIS — B1920 Unspecified viral hepatitis C without hepatic coma: Secondary | ICD-10-CM | POA: Diagnosis present

## 2013-01-19 DIAGNOSIS — E876 Hypokalemia: Secondary | ICD-10-CM | POA: Diagnosis present

## 2013-01-19 DIAGNOSIS — I517 Cardiomegaly: Secondary | ICD-10-CM | POA: Insufficient documentation

## 2013-01-19 DIAGNOSIS — I509 Heart failure, unspecified: Secondary | ICD-10-CM | POA: Diagnosis present

## 2013-01-19 DIAGNOSIS — I429 Cardiomyopathy, unspecified: Secondary | ICD-10-CM | POA: Diagnosis present

## 2013-01-19 DIAGNOSIS — I10 Essential (primary) hypertension: Secondary | ICD-10-CM | POA: Diagnosis present

## 2013-01-19 DIAGNOSIS — R0602 Shortness of breath: Secondary | ICD-10-CM | POA: Diagnosis not present

## 2013-01-19 DIAGNOSIS — F1210 Cannabis abuse, uncomplicated: Secondary | ICD-10-CM | POA: Diagnosis present

## 2013-01-19 DIAGNOSIS — Q619 Cystic kidney disease, unspecified: Secondary | ICD-10-CM | POA: Insufficient documentation

## 2013-01-19 DIAGNOSIS — Z79899 Other long term (current) drug therapy: Secondary | ICD-10-CM

## 2013-01-19 DIAGNOSIS — K5900 Constipation, unspecified: Secondary | ICD-10-CM | POA: Diagnosis present

## 2013-01-19 DIAGNOSIS — Z21 Asymptomatic human immunodeficiency virus [HIV] infection status: Secondary | ICD-10-CM | POA: Diagnosis present

## 2013-01-19 DIAGNOSIS — F1010 Alcohol abuse, uncomplicated: Secondary | ICD-10-CM | POA: Diagnosis present

## 2013-01-19 DIAGNOSIS — I428 Other cardiomyopathies: Secondary | ICD-10-CM | POA: Diagnosis present

## 2013-01-19 DIAGNOSIS — K219 Gastro-esophageal reflux disease without esophagitis: Secondary | ICD-10-CM | POA: Diagnosis present

## 2013-01-19 DIAGNOSIS — B20 Human immunodeficiency virus [HIV] disease: Secondary | ICD-10-CM | POA: Diagnosis present

## 2013-01-19 HISTORY — DX: Heart failure, unspecified: I50.9

## 2013-01-19 HISTORY — DX: Gastro-esophageal reflux disease without esophagitis: K21.9

## 2013-01-19 LAB — BASIC METABOLIC PANEL
BUN: 13 mg/dL (ref 6–23)
CO2: 18 mEq/L — ABNORMAL LOW (ref 19–32)
Chloride: 103 mEq/L (ref 96–112)
Creatinine, Ser: 1.01 mg/dL (ref 0.50–1.35)

## 2013-01-19 LAB — CBC
HCT: 38.9 % — ABNORMAL LOW (ref 39.0–52.0)
MCH: 29.4 pg (ref 26.0–34.0)
MCV: 86.1 fL (ref 78.0–100.0)
RBC: 4.52 MIL/uL (ref 4.22–5.81)
RDW: 13.8 % (ref 11.5–15.5)
WBC: 8.3 10*3/uL (ref 4.0–10.5)

## 2013-01-19 LAB — HEPATIC FUNCTION PANEL
ALT: 34 U/L (ref 0–53)
Albumin: 3.9 g/dL (ref 3.5–5.2)
Alkaline Phosphatase: 82 U/L (ref 39–117)
Total Protein: 7.7 g/dL (ref 6.0–8.3)

## 2013-01-19 MED ORDER — PANTOPRAZOLE SODIUM 40 MG PO TBEC
40.0000 mg | DELAYED_RELEASE_TABLET | Freq: Every day | ORAL | Status: DC
  Administered 2013-01-20 – 2013-01-22 (×3): 40 mg via ORAL

## 2013-01-19 MED ORDER — PANTOPRAZOLE SODIUM 40 MG IV SOLR
40.0000 mg | Freq: Once | INTRAVENOUS | Status: AC
  Administered 2013-01-19: 40 mg via INTRAVENOUS
  Filled 2013-01-19: qty 40

## 2013-01-19 MED ORDER — SODIUM CHLORIDE 0.9 % IJ SOLN
3.0000 mL | Freq: Two times a day (BID) | INTRAMUSCULAR | Status: DC
  Administered 2013-01-19 – 2013-01-22 (×6): 3 mL via INTRAVENOUS

## 2013-01-19 MED ORDER — RITONAVIR 100 MG PO CAPS
100.0000 mg | ORAL_CAPSULE | Freq: Two times a day (BID) | ORAL | Status: DC
  Administered 2013-01-20: 100 mg via ORAL
  Filled 2013-01-19 (×3): qty 1

## 2013-01-19 MED ORDER — POTASSIUM CHLORIDE CRYS ER 20 MEQ PO TBCR
40.0000 meq | EXTENDED_RELEASE_TABLET | Freq: Once | ORAL | Status: AC
  Administered 2013-01-19: 40 meq via ORAL
  Filled 2013-01-19: qty 2

## 2013-01-19 MED ORDER — GI COCKTAIL ~~LOC~~
30.0000 mL | Freq: Once | ORAL | Status: AC
  Administered 2013-01-19: 30 mL via ORAL
  Filled 2013-01-19: qty 30

## 2013-01-19 MED ORDER — LISINOPRIL 40 MG PO TABS
40.0000 mg | ORAL_TABLET | Freq: Every day | ORAL | Status: DC
  Administered 2013-01-20 – 2013-01-22 (×3): 40 mg via ORAL
  Filled 2013-01-19 (×3): qty 1

## 2013-01-19 MED ORDER — ETRAVIRINE 100 MG PO TABS
200.0000 mg | ORAL_TABLET | Freq: Two times a day (BID) | ORAL | Status: DC
  Administered 2013-01-20 – 2013-01-22 (×5): 200 mg via ORAL
  Filled 2013-01-19 (×7): qty 2

## 2013-01-19 MED ORDER — ONDANSETRON HCL 4 MG/2ML IJ SOLN: 4.0000 mg | Freq: Four times a day (QID) | INTRAMUSCULAR | Status: DC | PRN

## 2013-01-19 MED ORDER — FUROSEMIDE 10 MG/ML IJ SOLN: 40.0000 mg | Freq: Once | INTRAMUSCULAR | Status: DC

## 2013-01-19 MED ORDER — ZOLPIDEM TARTRATE 5 MG PO TABS
5.0000 mg | ORAL_TABLET | Freq: Once | ORAL | Status: AC
  Administered 2013-01-19: 5 mg via ORAL
  Filled 2013-01-19: qty 1

## 2013-01-19 MED ORDER — FUROSEMIDE 10 MG/ML IJ SOLN
40.0000 mg | Freq: Once | INTRAMUSCULAR | Status: AC
  Administered 2013-01-19: 40 mg via INTRAVENOUS
  Filled 2013-01-19: qty 4

## 2013-01-19 MED ORDER — FUROSEMIDE 10 MG/ML IJ SOLN
40.0000 mg | Freq: Two times a day (BID) | INTRAMUSCULAR | Status: DC
  Administered 2013-01-20 – 2013-01-22 (×5): 40 mg via INTRAVENOUS
  Filled 2013-01-19 (×6): qty 4

## 2013-01-19 MED ORDER — DARUNAVIR ETHANOLATE 800 MG PO TABS
800.0000 mg | ORAL_TABLET | Freq: Every day | ORAL | Status: DC
  Administered 2013-01-20 – 2013-01-22 (×3): 800 mg via ORAL
  Filled 2013-01-19 (×4): qty 1

## 2013-01-19 MED ORDER — EMTRICITABINE-TENOFOVIR DF 200-300 MG PO TABS
1.0000 | ORAL_TABLET | Freq: Every day | ORAL | Status: DC
  Administered 2013-01-20 – 2013-01-22 (×3): 1 via ORAL
  Filled 2013-01-19 (×3): qty 1

## 2013-01-19 MED ORDER — ACETAMINOPHEN 325 MG PO TABS: 650.0000 mg | ORAL_TABLET | ORAL | Status: DC | PRN

## 2013-01-19 MED ORDER — SODIUM CHLORIDE 0.9 % IJ SOLN: 3.0000 mL | INTRAMUSCULAR | Status: DC | PRN

## 2013-01-19 MED ORDER — CARVEDILOL 12.5 MG PO TABS
12.5000 mg | ORAL_TABLET | Freq: Every day | ORAL | Status: DC
  Administered 2013-01-20: 12.5 mg via ORAL
  Filled 2013-01-19: qty 1

## 2013-01-19 MED ORDER — SODIUM CHLORIDE 0.9 % IV SOLN: 250.0000 mL | INTRAVENOUS | Status: DC | PRN

## 2013-01-19 MED ORDER — ENOXAPARIN SODIUM 40 MG/0.4ML ~~LOC~~ SOLN
40.0000 mg | SUBCUTANEOUS | Status: DC
  Administered 2013-01-20: 40 mg via SUBCUTANEOUS
  Filled 2013-01-19 (×4): qty 0.4

## 2013-01-19 MED ORDER — ASPIRIN EC 81 MG PO TBEC
81.0000 mg | DELAYED_RELEASE_TABLET | Freq: Every day | ORAL | Status: DC
Start: 2013-01-20 — End: 2013-01-22
  Administered 2013-01-20 – 2013-01-22 (×3): 81 mg via ORAL
  Filled 2013-01-19 (×3): qty 1

## 2013-01-19 NOTE — Telephone Encounter (Signed)
Message copied by Lurlean Leyden on Thu Jan 19, 2013  4:05 PM ------      Message from: [name]      Created: Thu Jan 19, 2013  3:58 PM       CT noted, not abdominal. It does show severe cardiomyopathy.  I would suggest he see his cardiologist today or tomorrow.  Thanks ------

## 2013-01-19 NOTE — Telephone Encounter (Signed)
I reviewed the CT scan of his abdomen and there are no acute findings.  He has mid epigastric pain, nausea, shortness of breath in the setting of severe cardiomyopathy at baseline so I have asked him to go to [facility] ED to be evaluated for acute coronary syndrome.

## 2013-01-19 NOTE — Progress Notes (Signed)
Patient requested Ambien tonight.  MD on call made aware.  Awaiting new orders. Rn will continue to monitor. Louretta Parma, RN

## 2013-01-19 NOTE — H&P (Signed)
Date: 01/20/2013               Patient Name:  [name] MRN: [id number]  DOB: 1965-08-02 Age / Sex: 47 y.o., male   PCP: [name], MD         Medical Service: Internal Medicine Teaching Service         Attending Physician: Dr. Farley Ly, MD    First Contact: Dr. Lauris Chroman Pager: [phone]  Second Contact: Dr. Lorretta Harp Pager: [phone]       After Hours (After 5p/  First Contact Pager: [phone]  weekends / holidays): Second Contact Pager: [phone]   Chief Complaint: Shortness of breath and constipation  History of Present Illness:  [name] is a 47 y.o. male PMH of NICM, chronic systolic CHF (EF 28-41% in 05/11/2012), HTN, HIV on HAART (CD4 380, VL <20 on 12/06/12), and HCV who presents with complaints of SOB and constipation.  His shortness of breath began about 1 month ago, most notable with exertion. He now becomes dyspneic just walking down the driveway to his mailbox. He denies orthopnea, PND, leg swelling, but does endorse a ~22lb weight gain over the summer, from about 140 to 162lbs. He has been complaint with all of his medications, including those for heart failure. However, he continues to use alcohol, approximately 1 six pack of "Natty Light" beer daily. He has been socializing more over the summer, so he admits his alcohol intake has increased somewhat. He has no history of alcohol withdrawal symptoms or seizures, and his last drink was 4 days ago. He also smokes marijuana daily. Denies current drug use, but was a regular cocaine user until about 3 years ago. Denies chest pain, but notes an intermittent sensation of "tightness" when he becomes dyspneic with exertion, non-radiating. No fevers or chills.  [name] also complains of constipation since Monday (01/16/13). It is associated with epigastric abdominal pain, nausea, and NBNB vomiting. He has had decreased food intake 2/2 the vomiting, but endorses good fluid intake. He has been having trouble  passing gas as well. No history of abdominal surgeries. Of note, the patient was admitted for very similar complaints in 11/2011. Extensive workup was performed including CT chest/abdomen, abdominal U/S, HIDA scan, EGD, and gastric emptying study, which were unrevealing. He was found to be in a CHF exacerbation at that time. According to the patient, his abdominal complaints eventually resolved with diuretic therapy.  In the ED, a CT abdomen/pelvis was performed and showed no acute abnormalities. Of note the patient did have a watery BM after drinking his po contrast. Laboratory workup was remarkable for hypokalemia (3.3), for which he was given K-Dur po, and elevated pro-BNP (8265, last measurement was 173 on 08/23/12). CXR showed Kerley B lines on the right, suggestive of mild pulmonary edema. He received Lasix 40mg  IV. He was also given IV Protonix and a GI cocktail x2 for his epigastric pain, with some relief. He was eating a [place] sandwich during our interview.   Meds: Current Facility-Administered Medications  Medication Dose Route Frequency Provider Last Rate Last Dose  . 0.9 %  sodium chloride infusion  250 mL Intravenous PRN Manuela Schwartz, MD      . aspirin EC tablet 81 mg  81 mg Oral Daily Manuela Schwartz, MD      . carvedilol (COREG) tablet 12.5 mg  12.5 mg Oral Daily Manuela Schwartz, MD      . Darunavir  Ethanolate (PREZISTA) tablet 800 mg  800 mg Oral Q breakfast Manuela Schwartz, MD   800 mg at 01/20/13 5409  . emtricitabine-tenofovir (TRUVADA) 200-300 MG per tablet 1 tablet  1 tablet Oral Daily Manuela Schwartz, MD      . enoxaparin (LOVENOX) injection 40 mg  40 mg Subcutaneous Q24H Manuela Schwartz, MD      . etravirine (INTELENCE) tablet 200 mg  200 mg Oral BID WC Manuela Schwartz, MD   200 mg at 01/20/13 8119  . furosemide (LASIX) injection 40 mg  40 mg Intravenous BID Manuela Schwartz, MD      . lisinopril  (PRINIVIL,ZESTRIL) tablet 40 mg  40 mg Oral Daily Manuela Schwartz, MD      . ondansetron [facility]) injection 4 mg  4 mg Intravenous Q6H PRN Manuela Schwartz, MD      . pantoprazole (PROTONIX) EC tablet 40 mg  40 mg Oral Daily Manuela Schwartz, MD      . ritonavir (NORVIR) capsule 100 mg  100 mg Oral BID WC Manuela Schwartz, MD   100 mg at 01/20/13 1478  . sodium chloride 0.9 % injection 3 mL  3 mL Intravenous Q12H Manuela Schwartz, MD   3 mL at 01/19/13 2317  . sodium chloride 0.9 % injection 3 mL  3 mL Intravenous PRN Manuela Schwartz, MD        Allergies: Allergies as of 01/19/2013 - Review Complete 01/19/2013  Allergen Reaction Noted  . Bactrim Rash 11/13/2010   Past Medical History  Diagnosis Date  . Hypertension   . HIV infection   . Hepatitis     Hep B and Hep C (patient does not report this but these are listed in previous notes.)  . G6PD deficiency   . Chronic systolic heart failure     a. Echo 12/05/11:  EF 20-25%, diff HK with mild sparing of IL wall, mild AI, mod MR, mild LAE, mild RVE, mild reduced RVF.;  b.  Echo 05/11/2012:  Mild LVH, EF 20-25%, Gr 1 diast dysfn, mod MR, mild LAE  . NICM (nonischemic cardiomyopathy)     cardia CTA 8/13 negative for obstructive CAD   Past Surgical History  Procedure Laterality Date  . Finger surgery      Thumb laceration.    . Esophagogastroduodenoscopy  12/07/2011    Procedure: ESOPHAGOGASTRODUODENOSCOPY (EGD);  Surgeon: [name], MD,FACG;  Location: [facility] ENDOSCOPY;  Service: Endoscopy;  Laterality: N/A;   Family History  Problem Relation Age of Onset  . Cancer Mother    History   Social History  . Marital Status: Single    Spouse Name: N/A    Number of Children: N/A  . Years of Education: N/A   Occupational History  . Not on file.   Social History Main Topics  . Smoking status: Never Smoker   . Smokeless tobacco: Never Used  . Alcohol Use: 25.2 oz/week    42 Cans of beer per  week     Comment: socially  . Drug Use: 7.00 per week     Comment: marijuana daily  . Sexual Activity: Yes    Partners: Male     Comment: 28 year relationship, declined condoms   Other Topics Concern  . Not on file   Social History Narrative   Lives alone.  Drinks beer daily.  Liquor rarely.  He occasionally smokes marijuana..    Review of Systems: Pertinent items are noted in  HPI.  Physical Exam: Blood pressure 130/101, pulse 115, temperature 98.3 F (36.8 C), temperature source Oral, resp. rate 18, height 5\' 5"  (1.651 m), weight 152 lb 11.2 oz (69.264 kg), SpO2 99.00%. Physical Exam  Constitutional: He is oriented to person, place, and time and well-developed, well-nourished, and in no distress. No distress.  HENT:  Head: Normocephalic and atraumatic.  Mouth/Throat: Oropharynx is clear and moist.  Eyes: Conjunctivae and EOM are normal. Pupils are equal, round, and reactive to light. No scleral icterus.  Neck: Normal range of motion. Neck supple. No JVD (~3cm above sternal notch) present.  Cardiovascular: Regular rhythm and intact distal pulses.  Tachycardia present.  Exam reveals no gallop and no friction rub.   No murmur heard. Tachy to 108 on exam. No LE edema.  Pulmonary/Chest: Accessory muscle usage (mild) present. Not tachypneic. No respiratory distress. He has no decreased breath sounds. He has no wheezes. He has no rhonchi. He has rales (Soft crackles at both bases, R>L). He exhibits no tenderness.  Abdominal: Soft. Bowel sounds are normal. He exhibits no distension and no mass. There is tenderness (epigastric area). There is no rebound and no guarding.  Musculoskeletal: Normal range of motion. He exhibits no edema and no tenderness.  Neurological: He is alert and oriented to person, place, and time. No cranial nerve deficit. GCS score is 15.  Skin: Skin is warm and dry. No rash noted. He is not diaphoretic. No erythema. No pallor.  Psychiatric: Affect normal.   Lab  results: Basic Metabolic Panel:  Recent Labs  16/10/96 1630 01/19/13 2300 01/20/13 0438  NA 138  --  140  K 3.3*  --  3.8  CL 103  --  104  CO2 18*  --  20  GLUCOSE 87  --  99  BUN 13  --  15  CREATININE 1.01 1.10 1.15  CALCIUM 9.4  --  9.5  MG  --  2.1  --    Liver Function Tests:  Recent Labs  01/19/13 1756  AST 38*  ALT 34  ALKPHOS 82  BILITOT 1.0  PROT 7.7  ALBUMIN 3.9    Recent Labs  01/19/13 1756  LIPASE 18   CBC:  Recent Labs  01/19/13 1630 01/19/13 2300  WBC 8.3 7.8  HGB 13.3 13.6  HCT 38.9* 39.8  MCV 86.1 86.0  PLT 251 253   Cardiac Enzymes:  Recent Labs  01/19/13 2238 01/20/13 0438  TROPONINI <0.30 <0.30    Troponin i, poc  Date Value Range Status  01/19/2013 0.07  0.00 - 0.08 ng/mL Final   BNP:  Recent Labs  01/19/13 1630  PROBNP 8265.0*   D-Dimer:  Recent Labs  01/19/13 2154  DDIMER 1.35*   Imaging results:  Ct Abdomen Pelvis Wo Contrast  01/19/2013   *RADIOLOGY REPORT*  Clinical Data: Severe abdominal pain.  CT ABDOMEN AND PELVIS WITHOUT CONTRAST  Technique:  Multidetector CT imaging of the abdomen and pelvis was performed following the standard protocol without intravenous contrast.  Comparison: Single view of the abdomen 12/13/2011.  CT chest, abdomen and pelvis 11/30/2011.  Findings: There is massive cardiomegaly.  No pleural or pericardial effusion.  Lung bases demonstrate some dependent atelectasis.  An unchanged cyst is seen off the lower pole of the left kidney. Small calcification is again identified within the inferior aspect of the cyst.  There is no hydronephrosis on the right or left and no ureteral stones are identified.  The stomach, small and large bowel and  appendix appear normal.  The liver, adrenal glands, spleen and pancreas appear normal.  No lymphadenopathy or fluid is identified.  There is no focal bony abnormality.  IMPRESSION:  1.  No acute finding. 2.  Marked cardiomegaly. 3.  No change in a cyst off the  lower pole of the left kidney with a small calcification within it.   Original Report Authenticated By: Holley Dexter, M.D.   Dg Chest 2 View  01/19/2013   *RADIOLOGY REPORT*  Clinical Data: Chest pain, shortness of breath.  CHEST - 2 VIEW  Comparison: December 12, 2011.  Findings: Stable cardiomegaly.  No pleural effusion or pneumothorax is noted.  Stable mild central pulmonary vascular congestion is noted.  Kerley B lines are noted laterally in right lung base which may represent minimal interstitial edema.  No pneumonia or atelectasis is noted.  IMPRESSION: Minimal Kerley B lines seen laterally in right lung base which may represent minimal pulmonary edema.  Stable cardiomegaly and central pulmonary vascular congestion compared to prior exam.   Original Report Authenticated By: Lupita Raider.,  M.D.   Ct Angio Chest Pe W/cm &/or Wo Cm  01/20/2013   *RADIOLOGY REPORT*  Clinical Data: Shortness of breath.  Left-sided chest pain. Positive D-dimer.  CT ANGIOGRAPHY CHEST  Technique:  Multidetector CT imaging of the chest using the standard protocol during bolus administration of intravenous contrast. Multiplanar reconstructed images including MIPs were obtained and reviewed to evaluate the vascular anatomy.  Contrast: OMNIPAQUE IOHEXOL 350 MG/ML SOLN  Comparison: Chest x-ray dated 01/19/2013 and CT scan dated 11/30/2011  Findings: There are no pulmonary emboli.  There is chronic peribronchial thickening with chronic cardiomegaly.  No effusions. Progressive emphysematous blebs in both lungs.  Small mediastinal lymph nodes are stable.  No osseous abnormality.  Tiny pericardial effusion.  IMPRESSION:  1.  No pulmonary emboli. 2.  Chronic cardiomegaly with a tiny pericardial effusion. 3.  Slight progression of scattered emphysematous blebs. 4.  Chronic bronchitic changes.   Original Report Authenticated By: Francene Boyers, M.D.   Other results: EKG: Sinus tachycardia, rate 103. Right axis deviation. LBBB,  stable. No ST/T wave changes. Unchanged from EKG dated 10/27/12.  Assessment & Plan by Problem: [name] is a 47 y.o. male PMH of NICM, chronic systolic CHF (EF 16-10% in 05/11/2012), HTN, HIV on HAART (CD4 380, VL <20 on 12/06/12), and HCV who presents with complaints of DOE x 1 month and constipation x 4 days.  #Acute CHF exacerbation - The most likely cause of SOB in this patient with a history of NICM and CHF, 22lb weight gain, crackles on exam, Kerley B lines on CXR, and elevated pro-BNP is CHF exacerbation, brought on by increased alcohol use over the summer. His last echocardiogram on 05/11/2012 showed moderately dilated LV, severely reduced systolic function (EF 20-25%), grade 1 diastolic dysfunction, moderate MR.  - Admit to IMTS, telemetry - Consult to heart failure team - cardiologist is Dr. Lewayne Bunting - Repeat echo in the am - Lasix 40mg  IV BID - Continue home lisinopril, coreg - Strict I/Os - Repeat BMP, mag, CBC in am  #Elevated d-dimer - Another consideration for his SOB is PE. His Wells score on presentation was 1.5 (for tachycardia), placing him in the "low risk" group with 1.3% chance of PE based on an ED population. There were no clinical signs of DVT on exam. A D-dimer was performed and came back high at 1.35, shifting him to a "likely clinical probability" of  PE and making him a candidate for CTA. Cr 1.01. - Follow up results of CTA - No PE  #Chest tightness - Intermittent, non-radiating, related to dyspnea on exertion. No EKG changes. Will rule out ischemia in this patient with significant risk factors for MI. - Trending troponins (negx1) - Daily ASA 81mg   #Constipation/abdominal pain - Had a BM in the ED, so SBO unlikely. Lipase WNL. CT showed no acute issues. Discomfort, nausea improved with Protonix and GI cocktail x2. Symptoms could be related to bowel edema/fluid overload in abdomen. - GI cocktail as needed - Checking TSH - Protonix daily - Zofran prn  nausea  #HIV - Stable. CD4 380, VL <20 on 12/06/12. Followed by Dr. Luciana Axe. - Continue HAART (etravirine, Prezista, Norvir and Truvada)  #HCV - Stable LFTs. - NTD  #HTN - BPs 120-140s/80s-110s. - Continuing coreg, lisinopril, lasix as above  #Diet - Heart healthy  #DVT PPX - Lovenox subq  Dispo: Disposition is deferred at this time, awaiting improvement of current medical problems. Anticipated discharge in approximately 1-3 day(s).   The patient does have a current PCP [name], MD) and does need an [facility] hospital follow-up appointment after discharge.  The patient does not have transportation limitations that hinder transportation to clinic appointments.  Signed: Vivi Barrack, MD 01/20/2013, 6:27 AM

## 2013-01-19 NOTE — ED Provider Notes (Signed)
CSN: [id number]     Arrival date & time 01/19/13  1616 History     First MD Initiated Contact with Patient 01/19/13 1700     Chief Complaint  Patient presents with  . Chest Pain  . Abdominal Pain  . Shortness of Breath   (Consider location/radiation/quality/duration/timing/severity/associated sxs/prior Treatment) HPI 47 year old male with history of HTN, NICM, chronic systolic CHF (EF 40-98%), and Hepatitis C presents to the ED with complaints of SOB, abdominal pain, and intermittent chest pain.  He reports that he has these SOB and intermittent chest pain for quite some time (~ 1 month).  He has also recently developed abdominal pain.  His symptoms have been worsening since Monday.  He reports increasing SOB (with exertion) and epigastric abdominal pain.  He states that he has had associated nausea and vomiting and is unable to keep food down.  He has not had a BM since Monday.  He endorses compliance with his heart failure regimen but reports that he has continued to drink alcohol.  He is concerned that his heart is worsening and causing his SOB.  In regards to his chest pain, he reports that it is intermittent but severe (occasionally 10/10).  Chest pain described as "tightness."  Pain lasts hours and gradually resolves on its own.  Associated SOB.    Past Medical History  Diagnosis Date  . Hypertension   . HIV infection   . Hepatitis     Hep B and Hep C (patient does not report this but these are listed in previous notes.)  . G6PD deficiency   . Chronic systolic heart failure     a. Echo 12/05/11:  EF 20-25%, diff HK with mild sparing of IL wall, mild AI, mod MR, mild LAE, mild RVE, mild reduced RVF.;  b.  Echo 05/11/2012:  Mild LVH, EF 20-25%, Gr 1 diast dysfn, mod MR, mild LAE  . NICM (nonischemic cardiomyopathy)     cardia CTA 8/13 negative for obstructive CAD   Past Surgical History  Procedure Laterality Date  . Finger surgery      Thumb laceration.    .  Esophagogastroduodenoscopy  12/07/2011    Procedure: ESOPHAGOGASTRODUODENOSCOPY (EGD);  Surgeon: [name], MD,FACG;  Location: [facility] ENDOSCOPY;  Service: Endoscopy;  Laterality: N/A;   Family History  Problem Relation Age of Onset  . Cancer Mother    History  Substance Use Topics  . Smoking status: Never Smoker   . Smokeless tobacco: Never Used  . Alcohol Use: 25.2 oz/week    42 Cans of beer per week     Comment: socially    Review of Systems  Constitutional: Positive for chills, appetite change and unexpected weight change. Negative for fever.  Respiratory: Positive for chest tightness and shortness of breath.   Cardiovascular: Positive for chest pain.  Gastrointestinal: Positive for nausea, vomiting, abdominal pain and abdominal distention. Negative for diarrhea.  Genitourinary: Negative.   Musculoskeletal: Negative.   Skin: Negative for rash.  Neurological: Positive for weakness.    Allergies  Bactrim  Home Medications   Current Outpatient Rx  Name  Route  Sig  Dispense  Refill  . carvedilol (COREG) 25 MG tablet   Oral   Take 12.5 mg by mouth daily.         . darunavir (PREZISTA) 800 MG tablet   Oral   Take 1 tablet (800 mg total) by mouth daily with breakfast.   30 tablet   6   .  emtricitabine-tenofovir (TRUVADA) 200-300 MG per tablet   Oral   Take 1 tablet by mouth daily.         Marland Kitchen etravirine (INTELENCE) 100 MG tablet   Oral   Take 2 tablets (200 mg total) by mouth 2 (two) times daily with a meal.   120 tablet   6   . furosemide (LASIX) 20 MG tablet   Oral   Take 20 mg by mouth 2 (two) times daily.         Marland Kitchen ibuprofen (ADVIL,MOTRIN) 200 MG tablet   Oral   Take 400 mg by mouth every 6 (six) hours as needed for pain.         Marland Kitchen lisinopril (PRINIVIL,ZESTRIL) 40 MG tablet   Oral   Take 1 tablet (40 mg total) by mouth daily.   30 tablet   11   . omeprazole (PRILOSEC) 20 MG capsule   Oral   Take 1 capsule (20 mg total) by mouth daily.    30 capsule   11   . ritonavir (NORVIR) 100 MG capsule   Oral   Take 1 capsule (100 mg total) by mouth 2 (two) times daily.   60 capsule   6   . valACYclovir (VALTREX) 1000 MG tablet   Oral   Take 1 tablet (1,000 mg total) by mouth 2 (two) times daily. Take for 5 days for an outbreak   30 tablet   3    BP 135/106  Pulse 109  Temp(Src) 98.5 F (36.9 C) (Oral)  Resp 18  SpO2 98% Physical Exam  Constitutional: He is oriented to person, place, and time. He appears well-developed and well-nourished.  HENT:  Head: Normocephalic and atraumatic.  Eyes: No scleral icterus.  Neck: Neck supple.  Cardiovascular: Normal rate and regular rhythm.   No murmur heard. Pulmonary/Chest: Effort normal and breath sounds normal. He has no wheezes. He has no rales.  Abdominal: Soft. He exhibits distension. He exhibits no mass. There is tenderness. There is no rebound.  Tender to palpation in the epigastric region.  Musculoskeletal: He exhibits no edema.  Lymphadenopathy:    He has no cervical adenopathy.  Neurological: He is alert and oriented to person, place, and time.  Skin: Skin is warm and dry.   ED Course   Procedures (including critical care time) EKG:  Date: 01/19/2013  Rate: 103  Rhythm: sinus tachycardia  QRS Axis: right  Intervals: normal  ST/T Wave abnormalities: normal  Conduction Disutrbances:left bundle branch block  Narrative Interpretation: Sinus tachycardia with LBBB.   Old EKG Reviewed: unchanged    Labs Reviewed  CBC - Abnormal; Notable for the following:    HCT 38.9 (*)    All other components within normal limits  BASIC METABOLIC PANEL - Abnormal; Notable for the following:    Potassium 3.3 (*)    CO2 18 (*)    GFR calc non Af Amer 86 (*)    All other components within normal limits  PRO B NATRIURETIC PEPTIDE - Abnormal; Notable for the following:    Pro B Natriuretic peptide (BNP) 8265.0 (*)    All other components within normal limits  HEPATIC  FUNCTION PANEL  POCT I-STAT TROPONIN I   Ct Abdomen Pelvis Wo Contrast  01/19/2013   *RADIOLOGY REPORT*  Clinical Data: Severe abdominal pain.  CT ABDOMEN AND PELVIS WITHOUT CONTRAST  Technique:  Multidetector CT imaging of the abdomen and pelvis was performed following the standard protocol without intravenous contrast.  Comparison: Single  view of the abdomen 12/13/2011.  CT chest, abdomen and pelvis 11/30/2011.  Findings: There is massive cardiomegaly.  No pleural or pericardial effusion.  Lung bases demonstrate some dependent atelectasis.  An unchanged cyst is seen off the lower pole of the left kidney. Small calcification is again identified within the inferior aspect of the cyst.  There is no hydronephrosis on the right or left and no ureteral stones are identified.  The stomach, small and large bowel and appendix appear normal.  The liver, adrenal glands, spleen and pancreas appear normal.  No lymphadenopathy or fluid is identified.  There is no focal bony abnormality.  IMPRESSION:  1.  No acute finding. 2.  Marked cardiomegaly. 3.  No change in a cyst off the lower pole of the left kidney with a small calcification within it.   Original Report Authenticated By: Holley Dexter, M.D.   Dg Chest 2 View  01/19/2013   *RADIOLOGY REPORT*  Clinical Data: Chest pain, shortness of breath.  CHEST - 2 VIEW  Comparison: December 12, 2011.  Findings: Stable cardiomegaly.  No pleural effusion or pneumothorax is noted.  Stable mild central pulmonary vascular congestion is noted.  Kerley B lines are noted laterally in right lung base which may represent minimal interstitial edema.  No pneumonia or atelectasis is noted.  IMPRESSION: Minimal Kerley B lines seen laterally in right lung base which may represent minimal pulmonary edema.  Stable cardiomegaly and central pulmonary vascular congestion compared to prior exam.   Original Report Authenticated By: Lupita Raider.,  M.D.   1. SOB (shortness of breath)     MDM   47 year old male with history of HTN, NICM, chronic systolic CHF (EF 16-10%), and Hepatitis C presents to the ED with complaints of SOB, abdominal pain, and intermittent chest pain. - CT scan of the abdomen/pelvis was unremarkable with no acute findings. - Will obtain basic laboratory workup and chest xray.  1840 - Laboratory work up remarkable for hypokalemia (3.3) and markedly elevated BNP of 8265 (most recent prior was 173).  Chest xray revealed Kerley B lines suggestive of minimal/mild pulmonary edema.  Will give IV Lasix 40 mg.  Will treat epigastric pain with IV PPI and GI cocktail.   1930 - GI symptoms improved.  Patient remains SOB.  Patient to be admitted for CHF exacerbation.          Tommie Sams, DO 01/19/13 2005

## 2013-01-19 NOTE — Assessment & Plan Note (Addendum)
Unclear etiology. No history of surgery but it sounds like a small bowel obstruction. Other etiology could be his gallbladder which she's had problems with in the past. If his CAT scan is unrevealing I will check an ultrasound.  ADDENDUM 8/21 4:15 pm - CT scan noted, no significant abdominal findings.  Cardiomegaly noted.  With mid epigastric pain, advanced non -ischemic cardiomyopathy, nausea and sob, I am concerned with cardiac etiology and will have him evaluated by the ED.  If no etiology found, I will have him follow up with his cardiologist asap.

## 2013-01-19 NOTE — Telephone Encounter (Signed)
Per Dr Luciana Axe advised the patient to go and be evaluated in the ED. He advised he understands and advised that he is having increased pain in his chest and does not feel well at all.

## 2013-01-19 NOTE — Progress Notes (Signed)
  Subjective:    Patient ID: [name], male    DOB: November 11, 1965, 47 y.o.   MRN: [id number]  HPI He comes in for a work in visit. He has a history of HIV well-controlled on salvage regimen. 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 This has been going on since Monday. He has had bloating, little stool output and he has been throwing up. He had this about a year ago and did have his gallbladder out at that time. He continues to take his Lasix for his heart failure and has had no leg swelling.   Review of Systems  Constitutional: Negative for fever, fatigue and unexpected weight change.  HENT: Negative for trouble swallowing.   Eyes: Negative for visual disturbance.  Respiratory: Negative for cough and shortness of breath.   Cardiovascular: Negative for chest pain and leg swelling.  Gastrointestinal: Positive for abdominal pain and abdominal distention. Negative for nausea and diarrhea.  Musculoskeletal: Negative for myalgias and arthralgias.  Skin: Negative for rash.  Neurological: Negative for dizziness and headaches.  Hematological: Negative for adenopathy.  Psychiatric/Behavioral: Negative for dysphoric mood.       Objective:   Physical Exam  Constitutional: He appears well-developed and well-nourished. No distress.  Cardiovascular: Normal rate, regular rhythm and normal heart sounds.   No murmur heard. Abdominal: Soft. Bowel sounds are normal. He exhibits distension. He exhibits no mass. There is tenderness. There is no rebound and no guarding.  Mild tenderness in the right upper and lower quadrant  Musculoskeletal: He exhibits no edema.  Skin: No rash noted.  Psychiatric: He has a normal mood and affect. His behavior is normal.          Assessment & Plan:

## 2013-01-19 NOTE — ED Notes (Signed)
Pt has multiple complaints. Pt reports having abd pain and constipation x 3 days, having sob with exertion. Having n/v and chest pains. Went to pcp and had xray done and was told to come here due to increase in heart size on xray? ekg done at triage. Reports recent swelling to legs and weight loss. Airway intact at triage.

## 2013-01-20 ENCOUNTER — Inpatient Hospital Stay (HOSPITAL_COMMUNITY): Payer: Medicare Other

## 2013-01-20 ENCOUNTER — Encounter (HOSPITAL_COMMUNITY): Payer: Self-pay | Admitting: Radiology

## 2013-01-20 DIAGNOSIS — I059 Rheumatic mitral valve disease, unspecified: Secondary | ICD-10-CM | POA: Diagnosis not present

## 2013-01-20 DIAGNOSIS — J438 Other emphysema: Secondary | ICD-10-CM | POA: Diagnosis not present

## 2013-01-20 DIAGNOSIS — F1010 Alcohol abuse, uncomplicated: Secondary | ICD-10-CM

## 2013-01-20 DIAGNOSIS — I5023 Acute on chronic systolic (congestive) heart failure: Principal | ICD-10-CM

## 2013-01-20 DIAGNOSIS — J42 Unspecified chronic bronchitis: Secondary | ICD-10-CM | POA: Diagnosis not present

## 2013-01-20 LAB — TROPONIN I
Troponin I: <0.3 ng/mL (ref <0.30)
Troponin I: <0.3 ng/mL (ref <0.30)

## 2013-01-20 LAB — BASIC METABOLIC PANEL
Calcium: 9.5 mg/dL (ref 8.4–10.5)
GFR calc Af Amer: 85 mL/min — ABNORMAL LOW (ref >90)
GFR calc non Af Amer: 73 mL/min — ABNORMAL LOW (ref >90)
Sodium: 140 mEq/L (ref 135–145)

## 2013-01-20 LAB — CBC
Hemoglobin: 13.6 g/dL (ref 13.0–17.0)
Platelets: 253 10*3/uL (ref 150–400)
RBC: 4.63 MIL/uL (ref 4.22–5.81)
WBC: 7.8 10*3/uL (ref 4.0–10.5)

## 2013-01-20 LAB — CREATININE, SERUM
Creatinine, Ser: 1.1 mg/dL (ref 0.50–1.35)
GFR calc Af Amer: 89 mL/min — ABNORMAL LOW (ref >90)

## 2013-01-20 LAB — TSH: TSH: 1.098 u[IU]/mL (ref 0.350–4.500)

## 2013-01-20 MED ORDER — ENSURE COMPLETE PO LIQD
237.0000 mL | Freq: Two times a day (BID) | ORAL | Status: DC
  Administered 2013-01-20 – 2013-01-21 (×3): 237 mL via ORAL

## 2013-01-20 MED ORDER — SPIRONOLACTONE 12.5 MG HALF TABLET
12.5000 mg | ORAL_TABLET | Freq: Every day | ORAL | Status: DC
  Administered 2013-01-20 – 2013-01-22 (×3): 12.5 mg via ORAL
  Filled 2013-01-20 (×4): qty 1

## 2013-01-20 MED ORDER — DIGOXIN 250 MCG PO TABS
0.2500 mg | ORAL_TABLET | Freq: Every day | ORAL | Status: DC
  Administered 2013-01-20 – 2013-01-22 (×3): 0.25 mg via ORAL
  Filled 2013-01-20 (×4): qty 1

## 2013-01-20 MED ORDER — RITONAVIR 100 MG PO CAPS
100.0000 mg | ORAL_CAPSULE | Freq: Every day | ORAL | Status: DC
  Administered 2013-01-21 – 2013-01-22 (×2): 100 mg via ORAL
  Filled 2013-01-20 (×3): qty 1

## 2013-01-20 MED ORDER — ZOLPIDEM TARTRATE 5 MG PO TABS
5.0000 mg | ORAL_TABLET | Freq: Every evening | ORAL | Status: DC | PRN
  Administered 2013-01-20 – 2013-01-21 (×2): 5 mg via ORAL
  Filled 2013-01-20 (×2): qty 1

## 2013-01-20 MED ORDER — IOHEXOL 350 MG/ML SOLN
100.0000 mL | Freq: Once | INTRAVENOUS | Status: AC | PRN
  Administered 2013-01-20: 100 mL via INTRAVENOUS

## 2013-01-20 MED ORDER — CARVEDILOL 6.25 MG PO TABS
6.2500 mg | ORAL_TABLET | Freq: Every day | ORAL | Status: DC
  Administered 2013-01-21 – 2013-01-22 (×2): 6.25 mg via ORAL
  Filled 2013-01-20 (×3): qty 1

## 2013-01-20 NOTE — Progress Notes (Signed)
  Echocardiogram 2D Echocardiogram has been performed.  [name] 01/20/2013, 8:58 AM

## 2013-01-20 NOTE — Consult Note (Addendum)
Referring Physician:  Primary Physician: [name], MD Primary Cardiologist:  GT Reason for Consultation: CHF   HPI: [name] is a 47 y.o. male with a history of HIV, HCV, ETOH abuse and CHF due to NICM with EF 20-25%. We are asked to see him by the IMTS for help with managing his HF.   He has a h/o syncope and previously had an EP study which was non-inducible for VT. ICD was not placed.   Over past 2 weeks he notes progressive DOE to the point where he gets  He reports a 10 lb weight gain of about 10 lbs. He admits to drinking a 6-pack of beer daily. He also admits he has likely had dietary indiscretions with sodium. He has not had chest pain. He also describes PND and orthopnea. He does not weigh daily and does not really monitor his salt and fluid intake. He does not ever take extra Lasix because of weight gain. His symptoms have improved since admission but he still gets very SOB with minimal exertion.   He is uninterested in stopping ETOH.   Review of Systems:     Cardiac Review of Systems: {Y] = yes [ ]  = no  Chest Pain [    ]  Resting SOB [  y ] Exertional SOB  [ y ]  [name]  ]   Pedal Edema [  y ]    Palpitations [  ] Syncope  [  ]   Presyncope [   ]  General Review of Systems: [Y] = yes [  ]=no Constitional: recent weight change [  ]; anorexia [  ]; fatigue [  ]; nausea [  ]; night sweats [  ]; fever [  ]; or chills [  ];                                                                                                                                          Dental: poor dentition[  ];   Eye : blurred vision [  ]; diplopia [   ]; vision changes [  ];  Amaurosis fugax[  ]; Resp: cough [  ];  wheezing[  ];  hemoptysis[  ]; shortness of breath[ y ]; paroxysmal nocturnal dyspnea[ y]; dyspnea on exertion[ y ]; or orthopnea[ y ];  GI:  gallstones[  ], vomiting[  ];  dysphagia[  ]; melena[  ];  hematochezia [  ]; heartburn[  ];   Hx of  Colonoscopy[  ]; GU: kidney  stones [  ]; hematuria[  ];   dysuria [  ];  nocturia[  ];  history of     obstruction [  ];                 Skin: rash, swelling[  ];, hair loss[  ];  peripheral edema[  ];  or itching[  ];  Musculosketetal: myalgias[  ];  joint swelling[  ];  joint erythema[  ];  joint pain[  ];  back pain[  ];  Heme/Lymph: bruising[  ];  bleeding[  ];  anemia[  ];  Neuro: TIA[  ];  headaches[  ];  stroke[  ];  vertigo[  ];  seizures[  ];   paresthesias[  ];  difficulty walking[  ];  Psych:depression[  ]; anxiety[  ];  Endocrine: diabetes[  ];  thyroid dysfunction[  ];  Immunizations: Flu [  ]; Pneumococcal[  ];  Other:  Past Medical History  Diagnosis Date  . Hypertension   . HIV infection   . Hepatitis     Hep B and Hep C (patient does not report this but these are listed in previous notes.)  . G6PD deficiency   . Chronic systolic heart failure     a. Echo 12/05/11:  EF 20-25%, diff HK with mild sparing of IL wall, mild AI, mod MR, mild LAE, mild RVE, mild reduced RVF.;  b.  Echo 05/11/2012:  Mild LVH, EF 20-25%, Gr 1 diast dysfn, mod MR, mild LAE  . NICM (nonischemic cardiomyopathy)     cardia CTA 8/13 negative for obstructive CAD  . CHF (congestive heart failure)   . GERD (gastroesophageal reflux disease)    Past Surgical History  Procedure Laterality Date  . Finger surgery      Thumb laceration.    . Esophagogastroduodenoscopy  12/07/2011    Procedure: ESOPHAGOGASTRODUODENOSCOPY (EGD);  Surgeon: [name], MD,FACG;  Location: [facility] ENDOSCOPY;  Service: Endoscopy;  Laterality: N/A;     Current Medications: . aspirin EC  81 mg Oral Daily  . carvedilol  12.5 mg Oral Daily  . darunavir  800 mg Oral Q breakfast  . emtricitabine-tenofovir  1 tablet Oral Daily  . enoxaparin (LOVENOX) injection  40 mg Subcutaneous Q24H  . etravirine  200 mg Oral BID WC  . furosemide  40 mg Intravenous BID  . lisinopril  40 mg Oral Daily  . pantoprazole  40 mg Oral Daily  . [START ON 01/21/2013] ritonavir  100  mg Oral Q breakfast  . sodium chloride  3 mL Intravenous Q12H   Infusions:    Prescriptions prior to admission  Medication Sig Dispense Refill  . carvedilol (COREG) 25 MG tablet Take 12.5 mg by mouth daily.      . darunavir (PREZISTA) 800 MG tablet Take 1 tablet (800 mg total) by mouth daily with breakfast.  30 tablet  6  . emtricitabine-tenofovir (TRUVADA) 200-300 MG per tablet Take 1 tablet by mouth daily.      Marland Kitchen etravirine (INTELENCE) 100 MG tablet Take 2 tablets (200 mg total) by mouth 2 (two) times daily with a meal.  120 tablet  6  . furosemide (LASIX) 20 MG tablet Take 20 mg by mouth 2 (two) times daily.      Marland Kitchen ibuprofen (ADVIL,MOTRIN) 200 MG tablet Take 400 mg by mouth every 6 (six) hours as needed for pain.      Marland Kitchen lisinopril (PRINIVIL,ZESTRIL) 40 MG tablet Take 1 tablet (40 mg total) by mouth daily.  30 tablet  11  . omeprazole (PRILOSEC) 20 MG capsule Take 1 capsule (20 mg total) by mouth daily.  30 capsule  11  . ritonavir (NORVIR) 100 MG capsule Take 1 capsule (100 mg total) by mouth 2 (two) times daily.  60 capsule  6  . valACYclovir (VALTREX) 1000 MG tablet Take 1 tablet (1,000 mg total)  by mouth 2 (two) times daily. Take for 5 days for an outbreak  30 tablet  3     Allergies  Allergen Reactions  . Bactrim Rash    History   Social History  . Marital Status: Single    Spouse Name: N/A    Number of Children: N/A  . Years of Education: N/A   Occupational History  . Unemployed    Social History Main Topics  . Smoking status: Never Smoker   . Smokeless tobacco: Never Used  . Alcohol Use: 25.2 oz/week    42 Cans of beer per week     Comment: socially  . Drug Use: 7.00 per week    Special: Marijuana     Comment: marijuana daily  . Sexual Activity: Yes    Partners: Male     Comment: 28 year relationship, declined condoms   Other Topics Concern  . Not on file   Social History Narrative   Lives alone.  Drinks beer daily.  Liquor rarely.  He occasionally smokes  marijuana..    Family History  Problem Relation Age of Onset  . Cancer Mother    Family Status  Relation Status Death Age  . Mother Deceased 23    Lung Cancer    PHYSICAL EXAM: Filed Vitals:   01/20/13 1443  BP: 102/78  Pulse: 88  Temp: 98.2 F (36.8 C)  Resp: 17     Intake/Output Summary (Last 24 hours) at 01/20/13 1551 Last data filed at 01/20/13 1444  Gross per 24 hour  Intake    820 ml  Output    625 ml  Net    195 ml    General:  Comfortable at rest. Respiratory difficulty with minimal exertion. HEENT: normal. + chin stud and multiple earrings Neck: supple. 10 cm JVD. Carotids 2+ bilat; no bruits. No lymphadenopathy or thryomegaly appreciated. Cor: PMI laterally displaced. RRR +s3 2/6 MR Lungs: decreased breath sounds bases Abdomen: soft, nontender, mildly distended. No hepatosplenomegaly. No bruits or masses. Good bowel sounds. Extremities: no cyanosis, clubbing, rash, tr edema Neuro: alert & oriented x 3, cranial nerves grossly intact. moves all 4 extremities w/o difficulty. Affect pleasant.  ECG:  Sinus tach with LBBB (QRS )  ECHO: 01/20/2013 - Left ventricle: The cavity size was moderately to severely dilated. Wall thickness was increased in a pattern of mild LVH. Systolic function was severely reduced. The estimated ejection fraction was in the range of 20% to 25%. Diffuse hypokinesis. Findings consistent with left ventricular diastolic dysfunction. - Aortic valve: Mild regurgitation. - Mitral valve: Moderate regurgitation. - Left atrium: The atrium was moderately dilated. - Right ventricle: The cavity size was mildly dilated. Systolic function was mildly reduced. - Right atrium: The atrium was mildly dilated. - Tricuspid valve: Mild-moderate regurgitation. - Pulmonary arteries: PA peak pressure: 47mm Hg (S). - Impressions: LV is more dilated since 05/2013. Impressions:  - LV is more dilated since 05/2013.  Results for orders placed during  the hospital encounter of 01/19/13 (from the past 24 hour(s))  CBC     Status: Abnormal   Collection Time    01/19/13  4:30 PM      Result Value Range   WBC 8.3  4.0 - 10.5 K/uL   RBC 4.52  4.22 - 5.81 MIL/uL   Hemoglobin 13.3  13.0 - 17.0 g/dL   HCT 16.1 (*) 09.6 - 04.5 %   MCV 86.1  78.0 - 100.0 fL   MCH 29.4  26.0 -  34.0 pg   MCHC 34.2  30.0 - 36.0 g/dL   RDW 19.1  47.8 - 29.5 %   Platelets 251  150 - 400 K/uL  BASIC METABOLIC PANEL     Status: Abnormal   Collection Time    01/19/13  4:30 PM      Result Value Range   Sodium 138  135 - 145 mEq/L   Potassium 3.3 (*) 3.5 - 5.1 mEq/L   Chloride 103  96 - 112 mEq/L   CO2 18 (*) 19 - 32 mEq/L   Glucose, Bld 87  70 - 99 mg/dL   BUN 13  6 - 23 mg/dL   Creatinine, Ser 6.21  0.50 - 1.35 mg/dL   Calcium 9.4  8.4 - 30.8 mg/dL   GFR calc non Af Amer 86 (*) >90 mL/min   GFR calc Af Amer >90  >90 mL/min  PRO B NATRIURETIC PEPTIDE     Status: Abnormal   Collection Time    01/19/13  4:30 PM      Result Value Range   Pro B Natriuretic peptide (BNP) 8265.0 (*) 0 - 125 pg/mL  POCT I-STAT TROPONIN I     Status: None   Collection Time    01/19/13  5:19 PM      Result Value Range   Troponin i, poc 0.07  0.00 - 0.08 ng/mL   Comment 3           HEPATIC FUNCTION PANEL     Status: Abnormal   Collection Time    01/19/13  5:56 PM      Result Value Range   Total Protein 7.7  6.0 - 8.3 g/dL   Albumin 3.9  3.5 - 5.2 g/dL   AST 38 (*) 0 - 37 U/L   ALT 34  0 - 53 U/L   Alkaline Phosphatase 82  39 - 117 U/L   Total Bilirubin 1.0  0.3 - 1.2 mg/dL   Bilirubin, Direct 0.3  0.0 - 0.3 mg/dL   Indirect Bilirubin 0.7  0.3 - 0.9 mg/dL  LIPASE, BLOOD     Status: None   Collection Time    01/19/13  5:56 PM      Result Value Range   Lipase 18  11 - 59 U/L  D-DIMER, QUANTITATIVE     Status: Abnormal   Collection Time    01/19/13  9:54 PM      Result Value Range   D-Dimer, Quant 1.35 (*) 0.00 - 0.48 ug/mL-FEU  TROPONIN I     Status: None    Collection Time    01/19/13 10:38 PM      Result Value Range   Troponin I <0.30  <0.30 ng/mL  MAGNESIUM     Status: None   Collection Time    01/19/13 11:00 PM      Result Value Range   Magnesium 2.1  1.5 - 2.5 mg/dL  TSH     Status: None   Collection Time    01/19/13 11:00 PM      Result Value Range   TSH 1.098  0.350 - 4.500 uIU/mL  CBC     Status: None   Collection Time    01/19/13 11:00 PM      Result Value Range   WBC 7.8  4.0 - 10.5 K/uL   RBC 4.63  4.22 - 5.81 MIL/uL   Hemoglobin 13.6  13.0 - 17.0 g/dL   HCT 65.7  84.6 - 96.2 %  MCV 86.0  78.0 - 100.0 fL   MCH 29.4  26.0 - 34.0 pg   MCHC 34.2  30.0 - 36.0 g/dL   RDW 40.9  81.1 - 91.4 %   Platelets 253  150 - 400 K/uL  CREATININE, SERUM     Status: Abnormal   Collection Time    01/19/13 11:00 PM      Result Value Range   Creatinine, Ser 1.10  0.50 - 1.35 mg/dL   GFR calc non Af Amer 77 (*) >90 mL/min   GFR calc Af Amer 89 (*) >90 mL/min  TROPONIN I     Status: None   Collection Time    01/20/13  4:38 AM      Result Value Range   Troponin I <0.30  <0.30 ng/mL  BASIC METABOLIC PANEL     Status: Abnormal   Collection Time    01/20/13  4:38 AM      Result Value Range   Sodium 140  135 - 145 mEq/L   Potassium 3.8  3.5 - 5.1 mEq/L   Chloride 104  96 - 112 mEq/L   CO2 20  19 - 32 mEq/L   Glucose, Bld 99  70 - 99 mg/dL   BUN 15  6 - 23 mg/dL   Creatinine, Ser 7.82  0.50 - 1.35 mg/dL   Calcium 9.5  8.4 - 95.6 mg/dL   GFR calc non Af Amer 73 (*) >90 mL/min   GFR calc Af Amer 85 (*) >90 mL/min  TROPONIN I     Status: None   Collection Time    01/20/13 10:30 AM      Result Value Range   Troponin I <0.30  <0.30 ng/mL   Radiology:  Ct Abdomen Pelvis Wo Contrast 01/19/2013   *RADIOLOGY REPORT*  Clinical Data: Severe abdominal pain.  CT ABDOMEN AND PELVIS WITHOUT CONTRAST  Technique:  Multidetector CT imaging of the abdomen and pelvis was performed following the standard protocol without intravenous contrast.   Comparison: Single view of the abdomen 12/13/2011.  CT chest, abdomen and pelvis 11/30/2011.  Findings: There is massive cardiomegaly.  No pleural or pericardial effusion.  Lung bases demonstrate some dependent atelectasis.  An unchanged cyst is seen off the lower pole of the left kidney. Small calcification is again identified within the inferior aspect of the cyst.  There is no hydronephrosis on the right or left and no ureteral stones are identified.  The stomach, small and large bowel and appendix appear normal.  The liver, adrenal glands, spleen and pancreas appear normal.  No lymphadenopathy or fluid is identified.  There is no focal bony abnormality.  IMPRESSION:  1.  No acute finding. 2.  Marked cardiomegaly. 3.  No change in a cyst off the lower pole of the left kidney with a small calcification within it.   Original Report Authenticated By: Holley Dexter, M.D.   Dg Chest 2 View 01/19/2013   *RADIOLOGY REPORT*  Clinical Data: Chest pain, shortness of breath.  CHEST - 2 VIEW  Comparison: December 12, 2011.  Findings: Stable cardiomegaly.  No pleural effusion or pneumothorax is noted.  Stable mild central pulmonary vascular congestion is noted.  Kerley B lines are noted laterally in right lung base which may represent minimal interstitial edema.  No pneumonia or atelectasis is noted.  IMPRESSION: Minimal Kerley B lines seen laterally in right lung base which may represent minimal pulmonary edema.  Stable cardiomegaly and central pulmonary vascular congestion compared to prior  exam.   Original Report Authenticated By: Lupita Raider.,  M.D.   Ct Angio Chest Pe W/cm &/or Wo Cm 01/20/2013   *RADIOLOGY REPORT*  Clinical Data: Shortness of breath.  Left-sided chest pain. Positive D-dimer.  CT ANGIOGRAPHY CHEST  Technique:  Multidetector CT imaging of the chest using the standard protocol during bolus administration of intravenous contrast. Multiplanar reconstructed images including MIPs were obtained and  reviewed to evaluate the vascular anatomy.  Contrast: OMNIPAQUE IOHEXOL 350 MG/ML SOLN  Comparison: Chest x-ray dated 01/19/2013 and CT scan dated 11/30/2011  Findings: There are no pulmonary emboli.  There is chronic peribronchial thickening with chronic cardiomegaly.  No effusions. Progressive emphysematous blebs in both lungs.  Small mediastinal lymph nodes are stable.  No osseous abnormality.  Tiny pericardial effusion.  IMPRESSION:  1.  No pulmonary emboli. 2.  Chronic cardiomegaly with a tiny pericardial effusion. 3.  Slight progression of scattered emphysematous blebs. 4.  Chronic bronchitic changes.   Original Report Authenticated By: Francene Boyers, M.D.    ASSESSMENT:  1.  Acute/chronis systolic HF due to NICM      --EF 20-25cm LVIDd 6.7 cm  2.  Ongoing ETOH abuse 3. HIV 4. HCV 5. Moderate MR 6. Hypokalemia   PLAN/DISCUSSION: Mr. Puff has acute/chronic combined systolic (?and diastolic) CHF. His weight, however, is actually the lowest it has been since September 2013 (65.772 kg then). Currently on Lasix 40 mg IV BID. PAS 47 on echo today. Agree with diuresis. MD advise if a low-output state is affecting his dyspnea with minimal exertion.  Mr. Moragne admits to ETOH use, but is clear that he does not want to change any behavior. Therefore, he not a candidate for any advanced therapies.   Theodore Demark, PA-C 01/20/2013 3:51 PM Beeper [phone]  Patient seen and examined with Theodore Demark, PA-C. We discussed all aspects of the encounter. I agree with the assessment and plan as stated above.   Mr. Dobosz has advanced HF now with NYHA IV symptoms with mild volume overload and low output physiology. Given his progressive LV dilation on echo, I suspect this is likely due to ETOH cardiomyopathy. I had a long talk with him about the need for alcohol cessation but he states clearly that he is not going to stop drinking because he likes it. Based on this and his comorbidities he is not  a candidate for advanced therapies including an ICD (+/- CRT) as we have little hope of getting him better if he won't remove the inciting factor for his cardiomyopathy.   Thus at this point, we have little else to offer him aside from aggressive medical therapy. Agree with IV lasix. Will cut carvedilol back for now given low output. Add spiro 12.5 and digoxin 0.25 daily. In his current situation, his one year mortality rate is > 50%.   If he does decide to stop drinking, I would strongly consider CRT-D therapy.   Cristian Davitt,MD 4:31 PM

## 2013-01-20 NOTE — H&P (Signed)
Internal Medicine Attending Admission Note Date: 01/20/2013  Patient name: [name] Medical record number: [id number] Date of birth: [birth year] Age: 47 y.o. Gender: male  I saw and evaluated the patient. I reviewed the resident's note and I agree with the resident's findings and plan as documented in the resident's note, with the following additional comments.  Chief Complaint(s): Shortness of breath; constipation  History - key components related to admission: Patient is a 47 year old man with history of nonischemic cardiomyopathy, chronic systolic congestive heart failure with EF 20-25%, hypertension, HIV on HAART, and other problems as outlined in the medical history admitted with a one-month history of progressive dyspnea especially with exertion and with recent constipation.     Physical Exam - key components related to admission:  Filed Vitals:   01/19/13 2215 01/19/13 2241 01/20/13 0441 01/20/13 0954  BP:  133/110 130/101 137/102  Pulse: 104 110 115 101  Temp:  98.1 F (36.7 C) 98.3 F (36.8 C)   TempSrc:  Oral Oral   Resp:  18 18   Height:  5\' 5"  (1.651 m)    Weight:  152 lb 11.2 oz (69.264 kg)    SpO2: 96% 99% 99%     General: Alert, no distress Lungs: Bibasilar crackles Heart: Regular; no S3, no S4, no murmurs Abdomen: Bowel sounds present, soft; mild abdominal tenderness Extremities: No edema  Lab results:   Basic Metabolic Panel:  Recent Labs  78/29/56 1630 01/19/13 2300 01/20/13 0438  NA 138  --  140  K 3.3*  --  3.8  CL 103  --  104  CO2 18*  --  20  GLUCOSE 87  --  99  BUN 13  --  15  CREATININE 1.01 1.10 1.15  CALCIUM 9.4  --  9.5  MG  --  2.1  --     Liver Function Tests:  Recent Labs  01/19/13 1756  AST 38*  ALT 34  ALKPHOS 82  BILITOT 1.0  PROT 7.7  ALBUMIN 3.9    Recent Labs  01/19/13 1756  LIPASE 18   CBC:  Recent Labs  01/19/13 1630 01/19/13 2300  WBC 8.3 7.8  HGB 13.3 13.6  HCT 38.9* 39.8  MCV 86.1 86.0   PLT 251 253     Cardiac Enzymes:  Recent Labs  01/19/13 2238 01/20/13 0438 01/20/13 1030  TROPONINI <0.30 <0.30 <0.30    BNP:  Recent Labs  01/19/13 1630  PROBNP 8265.0*    D-Dimer:  Recent Labs  01/19/13 2154  DDIMER 1.35*     Thyroid Function Tests:  Recent Labs  01/19/13 2300  TSH 1.098      Urinalysis    Component Value Date/Time   COLORURINE YELLOW 12/04/2011 1451   APPEARANCEUR CLEAR 12/04/2011 1451   LABSPEC 1.024 12/04/2011 1451   PHURINE 6.0 12/04/2011 1451   GLUCOSEU NEGATIVE 12/04/2011 1451   HGBUR SMALL* 12/04/2011 1451   HGBUR negative 09/21/2007 1425   BILIRUBINUR SMALL* 12/04/2011 1451   KETONESUR 15* 12/04/2011 1451   PROTEINUR >300* 12/04/2011 1451   UROBILINOGEN 1.0 12/04/2011 1451   NITRITE NEGATIVE 12/04/2011 1451   LEUKOCYTESUR NEGATIVE 12/04/2011 1451    Imaging results:  Ct Abdomen Pelvis Wo Contrast  01/19/2013   *RADIOLOGY REPORT*  Clinical Data: Severe abdominal pain.  CT ABDOMEN AND PELVIS WITHOUT CONTRAST  Technique:  Multidetector CT imaging of the abdomen and pelvis was performed following the standard protocol without intravenous contrast.  Comparison: Single view of the abdomen 12/13/2011.  CT chest, abdomen and pelvis 11/30/2011.  Findings: There is massive cardiomegaly.  No pleural or pericardial effusion.  Lung bases demonstrate some dependent atelectasis.  An unchanged cyst is seen off the lower pole of the left kidney. Small calcification is again identified within the inferior aspect of the cyst.  There is no hydronephrosis on the right or left and no ureteral stones are identified.  The stomach, small and large bowel and appendix appear normal.  The liver, adrenal glands, spleen and pancreas appear normal.  No lymphadenopathy or fluid is identified.  There is no focal bony abnormality.  IMPRESSION:  1.  No acute finding. 2.  Marked cardiomegaly. 3.  No change in a cyst off the lower pole of the left kidney with a small calcification within  it.   Original Report Authenticated By: Holley Dexter, M.D.   Dg Chest 2 View  01/19/2013   *RADIOLOGY REPORT*  Clinical Data: Chest pain, shortness of breath.  CHEST - 2 VIEW  Comparison: December 12, 2011.  Findings: Stable cardiomegaly.  No pleural effusion or pneumothorax is noted.  Stable mild central pulmonary vascular congestion is noted.  Kerley B lines are noted laterally in right lung base which may represent minimal interstitial edema.  No pneumonia or atelectasis is noted.  IMPRESSION: Minimal Kerley B lines seen laterally in right lung base which may represent minimal pulmonary edema.  Stable cardiomegaly and central pulmonary vascular congestion compared to prior exam.   Original Report Authenticated By: Lupita Raider.,  M.D.   Ct Angio Chest Pe W/cm &/or Wo Cm  01/20/2013   *RADIOLOGY REPORT*  Clinical Data: Shortness of breath.  Left-sided chest pain. Positive D-dimer.  CT ANGIOGRAPHY CHEST  Technique:  Multidetector CT imaging of the chest using the standard protocol during bolus administration of intravenous contrast. Multiplanar reconstructed images including MIPs were obtained and reviewed to evaluate the vascular anatomy.  Contrast: OMNIPAQUE IOHEXOL 350 MG/ML SOLN  Comparison: Chest x-ray dated 01/19/2013 and CT scan dated 11/30/2011  Findings: There are no pulmonary emboli.  There is chronic peribronchial thickening with chronic cardiomegaly.  No effusions. Progressive emphysematous blebs in both lungs.  Small mediastinal lymph nodes are stable.  No osseous abnormality.  Tiny pericardial effusion.  IMPRESSION:  1.  No pulmonary emboli. 2.  Chronic cardiomegaly with a tiny pericardial effusion. 3.  Slight progression of scattered emphysematous blebs. 4.  Chronic bronchitic changes.   Original Report Authenticated By: Francene Boyers, M.D.    Other results: EKG: Sinus tachycardia, left bundle branch block   Assessment & Plan by Problem:   1.  Acute exacerbation of chronic  systolic congestive heart failure.  Plan is diurese; monitor; follow ins and outs and daily weights; follow electrolytes; continue ACE inhibitor and Coreg; consult heart failure team.  2.  Constipation with abdominal pain.  Possibly secondary to decompensation of heart failure; plan is symptomatic treatment and follow for improvement as heart failure is treated.  3.  HIV.  Patient is followed by Dr. Luciana Axe in the ID clinic.  Plan is continue HAART.  4.  Other problems and plans as per the resident physician's note.

## 2013-01-20 NOTE — Progress Notes (Signed)
INITIAL NUTRITION ASSESSMENT  DOCUMENTATION CODES Per approved criteria  -Not Applicable   INTERVENTION: - Ensure Complete po BID, each supplement provides 350 kcal and 13 grams of protein. - Pt encouraged to incorporate nutritional supplements such as chocolate milk or Sport and exercise psychologist at home.  NUTRITION DIAGNOSIS: Inadequate oral intake related to chronic diarrhea as evidenced by reported intake less than estimated needs.   Goal: Pt to meet >/= 90% of their estimated nutrition needs   Monitor:  Weight, po intake, bowel movements, acceptance of supplements  Reason for Assessment: MST  47 y.o. Rollins  Admitting Dx: Acute exacerbation of CHF (congestive heart failure)  ASSESSMENT: Pt admitted with shortness of breath and constipation. He has a history of HIV, nonischemic cardiomyopathy, and chronic systolic congestive heart failure.   Pt complains that he gets diarrhea frequently from "all of the medication that he is on." He reports that he sometimes skips meals because he doesn't want to have diarrhea. He says that he was constipated when he came to the hospital, but that it has resolved. He also reports that his appetite was poor PTA, but that the diuretics have helped him feel better and it has improved.  Height: Ht Readings from Last 1 Encounters:  01/19/13 5\' 5"  (1.651 m)    Weight: Wt Readings from Last 1 Encounters:  01/20/13 149 lb 3.2 oz (67.677 kg)    Ideal Body Weight: 57 kg  % Ideal Body Weight: 119%  Wt Readings from Last 10 Encounters:  01/20/13 149 lb 3.2 oz (67.677 kg)  01/19/13 156 lb (70.761 kg)  12/20/12 159 lb (72.122 kg)  10/13/12 164 lb 1.9 oz (74.444 kg)  09/01/12 165 lb 12.8 oz (75.206 kg)  08/23/12 164 lb 12.8 oz (74.753 kg)  08/18/12 166 lb (75.297 kg)  07/27/12 163 lb (73.936 kg)  04/07/12 158 lb 8 oz (71.895 kg)  02/25/12 152 lb (68.947 kg)    Usual Body Weight: 155 lbs  % Usual Body Weight: 96%  BMI:  Body  mass index is 24.83 kg/(m^2).  Estimated Nutritional Needs: Kcal: 1700-2000 Protein: 80-90 g Fluid: 1.David-2.0 L  Skin: WNL  Diet Order: Cardiac  EDUCATION NEEDS: -Education needs addressed   Intake/Output Summary (Last 24 hours) at 01/20/13 1602 Last data filed at 01/20/13 1444  Gross per 24 hour  Intake    820 ml  Output    625 ml  Net    195 ml    Last BM: none recorded; pt reports that he has had a bowel movement since admission   Labs:   Recent Labs Lab 01/19/13 1630 01/19/13 2300 01/20/13 0438  NA 138  --  140  K 3.3*  --  3.8  CL 103  --  104  CO2 18*  --  20  BUN 13  --  15  CREATININE 1.01 1.10 1.15  CALCIUM 9.4  --  9.5  MG  --  2.1  --   GLUCOSE 87  --  99    CBG (last 3)  No results found for this basename: GLUCAP,  in the last 72 hours  Scheduled Meds: . aspirin EC  81 mg Oral Daily  . carvedilol  12.5 mg Oral Daily  . darunavir  800 mg Oral Q breakfast  . emtricitabine-tenofovir  1 tablet Oral Daily  . enoxaparin (LOVENOX) injection  40 mg Subcutaneous Q24H  . etravirine  200 mg Oral BID WC  . furosemide  40 mg Intravenous BID  .  lisinopril  40 mg Oral Daily  . pantoprazole  40 mg Oral Daily  . [START ON 01/21/2013] ritonavir  100 mg Oral Q breakfast  . sodium chloride  3 mL Intravenous Q12H    Continuous Infusions:   Past Medical History  Diagnosis Date  . Hypertension   . HIV infection   . Hepatitis     Hep B and Hep C (patient does not report this but these are listed in previous notes.)  . G6PD deficiency   . Chronic systolic heart failure     a. Echo David/6/13:  EF 20-25%, diff HK with mild sparing of IL wall, mild AI, mod MR, mild LAE, mild RVE, mild reduced RVF.;  b.  Echo 05/11/2012:  Mild LVH, EF 20-25%, Gr 1 diast dysfn, mod MR, mild LAE  . NICM (nonischemic cardiomyopathy)     cardia CTA 8/13 negative for obstructive CAD  . CHF (congestive heart failure)   . GERD (gastroesophageal reflux disease)     Past Surgical History   Procedure Laterality Date  . Finger surgery      Thumb laceration.    . Esophagogastroduodenoscopy  David/12/2011    Procedure: ESOPHAGOGASTRODUODENOSCOPY (EGD);  Surgeon: [name], MD,FACG;  Location: [facility] ENDOSCOPY;  Service: Endoscopy;  Laterality: N/A;    Ebbie Latus RD, LDN

## 2013-01-20 NOTE — Progress Notes (Signed)
Pt alert and oriented times 4, NAD noted, able to communicate needs, ambulating in room independently, echo completed today, will continue to monitor

## 2013-01-20 NOTE — Progress Notes (Signed)
Subjective: [name] is a 47 y.o. male PMH of NICM, chronic systolic CHF (EF 27-25% in 05/11/2012), HTN, HIV on HAART (CD4 380, VL <20 on 12/06/12), and HCV who presents with complaints of SOB and constipation.  Patient seen at bedside today. Discussed his SOB at length. Says he cannot walk farther than 20 feet without feeling out of breath. Denies PND or orthopnea. Denies any recent fever, chills, or chest pain. Recent DOE most likely d/t high alcohol intake (at least 6 beers/day), which has been an ongoing issue for him. May be causing a worsening cardiomyopathy. Says he is feeling better since his admission. Lasix increased in hospital to 40 mg bid.   Objective: Vital signs in last 24 hours: Filed Vitals:   01/20/13 0441 01/20/13 0954 01/20/13 1408 01/20/13 1443  BP: 130/101 137/102  102/78  Pulse: 115 101  88  Temp: 98.3 F (36.8 C)   98.2 F (36.8 C)  TempSrc: Oral   Oral  Resp: 18   17  Height:      Weight:   149 lb 3.2 oz (67.677 kg)   SpO2: 99%   92%   Weight change:   Intake/Output Summary (Last 24 hours) at 01/20/13 1554 Last data filed at 01/20/13 1444  Gross per 24 hour  Intake    820 ml  Output    625 ml  Net    195 ml   Physical Exam: General: Alert, cooperative, and in no apparent distress HEENT: Vision grossly intact, oropharynx clear and non-erythematous  Neck: Full range of motion without pain, supple, no lymphadenopathy or carotid bruits Lungs: Clear to ascultation bilaterally, normal work of respiration, no wheezes, rales, ronchi Heart: Regular rate and rhythm, no murmurs, gallops, or rubs Abdomen: Soft, non-tender, non-distended, normal bowel sounds Extremities: No cyanosis, clubbing, or edema Neurologic: Alert & oriented X3, cranial nerves II-XII intact, strength grossly intact, sensation intact to light touch  Lab Results: Basic Metabolic Panel:  Recent Labs Lab 01/19/13 1630 01/19/13 2300 01/20/13 0438  NA 138  --  140  K 3.3*  --   3.8  CL 103  --  104  CO2 18*  --  20  GLUCOSE 87  --  99  BUN 13  --  15  CREATININE 1.01 1.10 1.15  CALCIUM 9.4  --  9.5  MG  --  2.1  --    Liver Function Tests:  Recent Labs Lab 01/19/13 1756  AST 38*  ALT 34  ALKPHOS 82  BILITOT 1.0  PROT 7.7  ALBUMIN 3.9    Recent Labs Lab 01/19/13 1756  LIPASE 18   No results found for this basename: AMMONIA,  in the last 168 hours CBC:  Recent Labs Lab 01/19/13 1630 01/19/13 2300  WBC 8.3 7.8  HGB 13.3 13.6  HCT 38.9* 39.8  MCV 86.1 86.0  PLT 251 253   Cardiac Enzymes:  Recent Labs Lab 01/19/13 2238 01/20/13 0438 01/20/13 1030  TROPONINI <0.30 <0.30 <0.30   BNP:  Recent Labs Lab 01/19/13 1630  PROBNP 8265.0*   D-Dimer:  Recent Labs Lab 01/19/13 2154  DDIMER 1.35*   Thyroid Function Tests:  Recent Labs Lab 01/19/13 2300  TSH 1.098   Studies/Results: Ct Abdomen Pelvis Wo Contrast  01/19/2013   *RADIOLOGY REPORT*  Clinical Data: Severe abdominal pain.  CT ABDOMEN AND PELVIS WITHOUT CONTRAST  Technique:  Multidetector CT imaging of the abdomen and pelvis was performed following the standard protocol without intravenous contrast.  Comparison: Single view of the abdomen 12/13/2011.  CT chest, abdomen and pelvis 11/30/2011.  Findings: There is massive cardiomegaly.  No pleural or pericardial effusion.  Lung bases demonstrate some dependent atelectasis.  An unchanged cyst is seen off the lower pole of the left kidney. Small calcification is again identified within the inferior aspect of the cyst.  There is no hydronephrosis on the right or left and no ureteral stones are identified.  The stomach, small and large bowel and appendix appear normal.  The liver, adrenal glands, spleen and pancreas appear normal.  No lymphadenopathy or fluid is identified.  There is no focal bony abnormality.  IMPRESSION:  1.  No acute finding. 2.  Marked cardiomegaly. 3.  No change in a cyst off the lower pole of the left kidney  with a small calcification within it.   Original Report Authenticated By: Holley Dexter, M.D.   Dg Chest 2 View  01/19/2013   *RADIOLOGY REPORT*  Clinical Data: Chest pain, shortness of breath.  CHEST - 2 VIEW  Comparison: December 12, 2011.  Findings: Stable cardiomegaly.  No pleural effusion or pneumothorax is noted.  Stable mild central pulmonary vascular congestion is noted.  Kerley B lines are noted laterally in right lung base which may represent minimal interstitial edema.  No pneumonia or atelectasis is noted.  IMPRESSION: Minimal Kerley B lines seen laterally in right lung base which may represent minimal pulmonary edema.  Stable cardiomegaly and central pulmonary vascular congestion compared to prior exam.   Original Report Authenticated By: Lupita Raider.,  M.D.   Ct Angio Chest Pe W/cm &/or Wo Cm  01/20/2013   *RADIOLOGY REPORT*  Clinical Data: Shortness of breath.  Left-sided chest pain. Positive D-dimer.  CT ANGIOGRAPHY CHEST  Technique:  Multidetector CT imaging of the chest using the standard protocol during bolus administration of intravenous contrast. Multiplanar reconstructed images including MIPs were obtained and reviewed to evaluate the vascular anatomy.  Contrast: OMNIPAQUE IOHEXOL 350 MG/ML SOLN  Comparison: Chest x-ray dated 01/19/2013 and CT scan dated 11/30/2011  Findings: There are no pulmonary emboli.  There is chronic peribronchial thickening with chronic cardiomegaly.  No effusions. Progressive emphysematous blebs in both lungs.  Small mediastinal lymph nodes are stable.  No osseous abnormality.  Tiny pericardial effusion.  IMPRESSION:  1.  No pulmonary emboli. 2.  Chronic cardiomegaly with a tiny pericardial effusion. 3.  Slight progression of scattered emphysematous blebs. 4.  Chronic bronchitic changes.   Original Report Authenticated By: Francene Boyers, M.D.   Medications: I have reviewed the patient's current medications. Scheduled Meds: . aspirin EC  81 mg Oral  Daily  . carvedilol  12.5 mg Oral Daily  . darunavir  800 mg Oral Q breakfast  . emtricitabine-tenofovir  1 tablet Oral Daily  . enoxaparin (LOVENOX) injection  40 mg Subcutaneous Q24H  . etravirine  200 mg Oral BID WC  . furosemide  40 mg Intravenous BID  . lisinopril  40 mg Oral Daily  . pantoprazole  40 mg Oral Daily  . [START ON 01/21/2013] ritonavir  100 mg Oral Q breakfast  . sodium chloride  3 mL Intravenous Q12H   Continuous Infusions:  PRN Meds:.sodium chloride, ondansetron (ZOFRAN) IV, sodium chloride Assessment/Plan: [name] is a 47 y.o. male PMH of NICM, chronic systolic CHF (EF 16-10% in 05/11/2012), HTN, HIV on HAART (CD4 380, VL <20 on 12/06/12), and HCV who presents with complaints of SOB and constipation.  #Acute CHF exacerbation -  The most likely cause of SOB in this patient with a history of NICM and CHF, 22lb weight gain, crackles on exam, Kerley B lines on CXR, and elevated pro-BNP (8265; last pBNP was 173 on 08/23/12) is CHF exacerbation, brought on by increased alcohol use over the summer. His last echocardiogram on 05/11/2012 showed moderately dilated LV, severely reduced systolic function (EF 20-25%), grade 1 diastolic dysfunction, moderate MR. -ECHO today showed EF unchanged from prior ECHO. Severely reduced systolic function, diffuse hypokinesis, mild LVH, and LV diastolic dysfunction.  -Consulted cardiology, much appreciate the recommendations. Feel that this is most likely 2/2 worsening EtOh cardiomyopathy. The patient does not wish to stop his alcohol intake at this time, therefore he is not a candidate for advanced therapy, including AICD. Added Spironolactone 12.5 qd and digoxin 0.25 mg qd. Also decreased carvedilol to 6.25 qd. -Continue Lasix 40mg  IV BID and home Lisinopril.  #Elevated d-dimer- CTA was negative for PE.  #Chest tightness- Intermittent, non-radiating, related to dyspnea on exertion. No EKG changes. -Troponins -ve x3. -Continue ASA 81  mg  #Constipation/abdominal pain- Had a BM in the ED, so SBO unlikely. Lipase WNL. CT showed no acute issues. Discomfort, nausea improved with Protonix and GI cocktail x2 in ED. Symptoms could be related to bowel edema/fluid overload in abdomen. -TSH 1.10, wnl. -Continue protonix daily -Zofran prn for nausea.  #HIV - Stable. CD4 380, VL <20 on 12/06/12. Followed by Dr. Luciana Axe.  -Continue HAART (etravirine, Prezista, Norvir and Truvada).  #HCV - Stable LFTs.  #HTN - BPs 120-140s/80s-110s.  - Continuing lisinopril and lasix as above. Decreased carvedilol to 6.25 qd.  #DVT PPX - Lovenox subq  Dispo: Disposition is deferred at this time, awaiting improvement of current medical problems.  Anticipated discharge in approximately 2-3 day(s).   The patient does have a current PCP [name], MD) and does need an [facility] hospital follow-up appointment after discharge.  The patient does not have transportation limitations that hinder transportation to clinic appointments.  .Services Needed at time of discharge: Y = Yes, Blank = No PT:   OT:   RN:   Equipment:   Other:     LOS: 1 day   Lars Masson, MD 01/20/2013, 3:54 PM Pager: [phone]

## 2013-01-21 DIAGNOSIS — I5023 Acute on chronic systolic (congestive) heart failure: Secondary | ICD-10-CM | POA: Diagnosis not present

## 2013-01-21 LAB — BASIC METABOLIC PANEL
CO2: 22 mEq/L (ref 19–32)
Calcium: 9.6 mg/dL (ref 8.4–10.5)
Creatinine, Ser: 1.28 mg/dL (ref 0.50–1.35)
GFR calc Af Amer: 80 mL/min — ABNORMAL LOW (ref >90)
GFR calc non Af Amer: 69 mL/min — ABNORMAL LOW (ref >90)
Glucose, Bld: 98 mg/dL (ref 70–99)
Potassium: 3.1 mEq/L — ABNORMAL LOW (ref 3.5–5.1)
Sodium: 140 mEq/L (ref 135–145)
Sodium: 139 mEq/L (ref 135–145)

## 2013-01-21 LAB — LIPID PANEL
Cholesterol: 123 mg/dL (ref 0–200)
HDL: 36 mg/dL — ABNORMAL LOW (ref >39)
Triglycerides: 113 mg/dL (ref <150)

## 2013-01-21 MED ORDER — BISACODYL 5 MG PO TBEC: 5.0000 mg | DELAYED_RELEASE_TABLET | Freq: Every day | ORAL | Status: DC | PRN

## 2013-01-21 MED ORDER — POTASSIUM CHLORIDE CRYS ER 20 MEQ PO TBCR
40.0000 meq | EXTENDED_RELEASE_TABLET | Freq: Once | ORAL | Status: AC
  Administered 2013-01-21: 40 meq via ORAL
  Filled 2013-01-21: qty 2

## 2013-01-21 NOTE — Progress Notes (Signed)
The patient did not appear to diurese well overnight and is concerned that the Lasix isn't working as well as previously.  Otherwise, he slept well and did not have any acute changes overnight.

## 2013-01-21 NOTE — Progress Notes (Signed)
   Subjective:  Denies CP or dyspnea; symptoms improving    Objective:  Filed Vitals:   01/20/13 1812 01/20/13 2115 01/21/13 0418 01/21/13 0853  BP: 113/87 104/84 98/64   Pulse: 90 95 87   Temp: 97.5 F (36.4 C) 97.7 F (36.5 C) 98.1 F (36.7 C)   TempSrc: Oral Oral Oral   Resp: 18 18 18    Height:   5\' 5"  (1.651 m) 5\' 5"  (1.651 m)  Weight:   149 lb 0.5 oz (67.6 kg) 149 lb 0.5 oz (67.6 kg)  SpO2: 100% 96% 94%     Intake/Output from previous day:  Intake/Output Summary (Last 24 hours) at 01/21/13 1214 Last data filed at 01/21/13 8119  Gross per 24 hour  Intake   1183 ml  Output    475 ml  Net    708 ml    Physical Exam: Physical exam: Well-developed well-nourished in no acute distress.  Skin is warm and dry.  HEENT is normal.  Neck is supple.  Chest is clear to auscultation with normal expansion.  Cardiovascular exam is regular rate and rhythm.  Abdominal exam nontender or distended. No masses palpated. Extremities show no edema. neuro grossly intact    Lab Results: Basic Metabolic Panel:  Recent Labs  14/78/29 2300 01/20/13 0438 01/21/13 0401  NA  --  140 140  K  --  3.8 3.1*  CL  --  104 101  CO2  --  20 25  GLUCOSE  --  99 91  BUN  --  15 19  CREATININE 1.10 1.15 1.20  CALCIUM  --  9.5 9.3  MG 2.1  --   --    CBC:  Recent Labs  01/19/13 1630 01/19/13 2300  WBC 8.3 7.8  HGB 13.3 13.6  HCT 38.9* 39.8  MCV 86.1 86.0  PLT 251 253   Cardiac Enzymes:  Recent Labs  01/19/13 2238 01/20/13 0438 01/20/13 1030  TROPONINI <0.30 <0.30 <0.30     Assessment/Plan:  1. Acute/chronis systolic HF due to NICM  --EF 56-21HY LVIDd 6.7 cm  2. Ongoing ETOH abuse  3. HIV  4. HCV  5. Moderate MR  6. Hypokalemia Continue present dose of lasix today and follow renal function; supplement K. Can most likely be DCed in AM. Discussed importance of avoiding ETOH. Long term prognosis poor if he does not follow these instructions.  Olga Millers 01/21/2013, 12:14 PM

## 2013-01-21 NOTE — Progress Notes (Signed)
Subjective: [name] is a 47 y.o. male PMH of NICM, chronic systolic CHF (EF 96-04% in 05/11/2012), HTN, HIV on HAART (CD4 380, VL <20 on 12/06/12), and HCV who presents with complaints of SOB and constipation.  Patient seen at bedside today. Says he is feeling better, SOB less today. Still w/ constipation. Denies any other complaints. Had trouble falling asleep last night, was given [place] which helped. Had a long discussion about his alcohol use today. Patient is not willing to give up drinking even though it is most likely the cause of his worsening CHF.  Objective: Vital signs in last 24 hours: Filed Vitals:   01/20/13 1812 01/20/13 2115 01/21/13 0418 01/21/13 0853  BP: 113/87 104/84 98/64   Pulse: 90 95 87   Temp: 97.5 F (36.4 C) 97.7 F (36.5 C) 98.1 F (36.7 C)   TempSrc: Oral Oral Oral   Resp: 18 18 18    Height:   5\' 5"  (1.651 m) 5\' 5"  (1.651 m)  Weight:   149 lb 0.5 oz (67.6 kg) 149 lb 0.5 oz (67.6 kg)  SpO2: 100% 96% 94%    Weight change: -3 lb 8 oz (-1.588 kg)  Intake/Output Summary (Last 24 hours) at 01/21/13 1029 Last data filed at 01/21/13 5409  Gross per 24 hour  Intake   1183 ml  Output    950 ml  Net    233 ml   Physical Exam: General: Alert, cooperative, and in no apparent distress HEENT: Vision grossly intact, oropharynx clear and non-erythematous  Neck: Full range of motion without pain, supple, no lymphadenopathy or carotid bruits Lungs: Clear to ascultation bilaterally, normal work of respiration, no wheezes, rales, ronchi Heart: Regular rate and rhythm, no murmurs heard, S3 heard. Abdomen: Soft, non-tender, non-distended, normal bowel sounds Extremities: No cyanosis, clubbing, or edema Neurologic: Alert & oriented X3, cranial nerves II-XII intact, strength grossly intact, sensation intact to light touch  Lab Results: Basic Metabolic Panel:  Recent Labs Lab 01/19/13 2300 01/20/13 0438 01/21/13 0401  NA  --  140 140  K  --  3.8 3.1*   CL  --  104 101  CO2  --  20 25  GLUCOSE  --  99 91  BUN  --  15 19  CREATININE 1.10 1.15 1.20  CALCIUM  --  9.5 9.3  MG 2.1  --   --    Liver Function Tests:  Recent Labs Lab 01/19/13 1756  AST 38*  ALT 34  ALKPHOS 82  BILITOT 1.0  PROT 7.7  ALBUMIN 3.9    Recent Labs Lab 01/19/13 1756  LIPASE 18   No results found for this basename: AMMONIA,  in the last 168 hours CBC:  Recent Labs Lab 01/19/13 1630 01/19/13 2300  WBC 8.3 7.8  HGB 13.3 13.6  HCT 38.9* 39.8  MCV 86.1 86.0  PLT 251 253   Cardiac Enzymes:  Recent Labs Lab 01/19/13 2238 01/20/13 0438 01/20/13 1030  TROPONINI <0.30 <0.30 <0.30   BNP:  Recent Labs Lab 01/19/13 1630  PROBNP 8265.0*   D-Dimer:  Recent Labs Lab 01/19/13 2154  DDIMER 1.35*   Thyroid Function Tests:  Recent Labs Lab 01/19/13 2300  TSH 1.098   Studies/Results: Ct Abdomen Pelvis Wo Contrast  01/19/2013   *RADIOLOGY REPORT*  Clinical Data: Severe abdominal pain.  CT ABDOMEN AND PELVIS WITHOUT CONTRAST  Technique:  Multidetector CT imaging of the abdomen and pelvis was performed following the standard protocol without intravenous contrast.  Comparison: Single view of the abdomen 12/13/2011.  CT chest, abdomen and pelvis 11/30/2011.  Findings: There is massive cardiomegaly.  No pleural or pericardial effusion.  Lung bases demonstrate some dependent atelectasis.  An unchanged cyst is seen off the lower pole of the left kidney. Small calcification is again identified within the inferior aspect of the cyst.  There is no hydronephrosis on the right or left and no ureteral stones are identified.  The stomach, small and large bowel and appendix appear normal.  The liver, adrenal glands, spleen and pancreas appear normal.  No lymphadenopathy or fluid is identified.  There is no focal bony abnormality.  IMPRESSION:  1.  No acute finding. 2.  Marked cardiomegaly. 3.  No change in a cyst off the lower pole of the left kidney with a  small calcification within it.   Original Report Authenticated By: Holley Dexter, M.D.   Dg Chest 2 View  01/19/2013   *RADIOLOGY REPORT*  Clinical Data: Chest pain, shortness of breath.  CHEST - 2 VIEW  Comparison: December 12, 2011.  Findings: Stable cardiomegaly.  No pleural effusion or pneumothorax is noted.  Stable mild central pulmonary vascular congestion is noted.  Kerley B lines are noted laterally in right lung base which may represent minimal interstitial edema.  No pneumonia or atelectasis is noted.  IMPRESSION: Minimal Kerley B lines seen laterally in right lung base which may represent minimal pulmonary edema.  Stable cardiomegaly and central pulmonary vascular congestion compared to prior exam.   Original Report Authenticated By: Lupita Raider.,  M.D.   Ct Angio Chest Pe W/cm &/or Wo Cm  01/20/2013   *RADIOLOGY REPORT*  Clinical Data: Shortness of breath.  Left-sided chest pain. Positive D-dimer.  CT ANGIOGRAPHY CHEST  Technique:  Multidetector CT imaging of the chest using the standard protocol during bolus administration of intravenous contrast. Multiplanar reconstructed images including MIPs were obtained and reviewed to evaluate the vascular anatomy.  Contrast: OMNIPAQUE IOHEXOL 350 MG/ML SOLN  Comparison: Chest x-ray dated 01/19/2013 and CT scan dated 11/30/2011  Findings: There are no pulmonary emboli.  There is chronic peribronchial thickening with chronic cardiomegaly.  No effusions. Progressive emphysematous blebs in both lungs.  Small mediastinal lymph nodes are stable.  No osseous abnormality.  Tiny pericardial effusion.  IMPRESSION:  1.  No pulmonary emboli. 2.  Chronic cardiomegaly with a tiny pericardial effusion. 3.  Slight progression of scattered emphysematous blebs. 4.  Chronic bronchitic changes.   Original Report Authenticated By: Francene Boyers, M.D.   Medications: I have reviewed the patient's current medications. Scheduled Meds: . aspirin EC  81 mg Oral Daily  .  carvedilol  6.25 mg Oral Q breakfast  . darunavir  800 mg Oral Q breakfast  . digoxin  0.25 mg Oral Daily  . emtricitabine-tenofovir  1 tablet Oral Daily  . enoxaparin (LOVENOX) injection  40 mg Subcutaneous Q24H  . etravirine  200 mg Oral BID WC  . feeding supplement  237 mL Oral BID BM  . furosemide  40 mg Intravenous BID  . lisinopril  40 mg Oral Daily  . pantoprazole  40 mg Oral Daily  . potassium chloride  40 mEq Oral Once  . ritonavir  100 mg Oral Q breakfast  . sodium chloride  3 mL Intravenous Q12H  . spironolactone  12.5 mg Oral Daily   Continuous Infusions:  PRN Meds:.sodium chloride, bisacodyl, ondansetron (ZOFRAN) IV, sodium chloride, zolpidem Assessment/Plan: Mr. SIE FORMISANO is a 47 y.o. male  PMH of NICM, chronic systolic CHF (EF 45-40% in 05/11/2012), HTN, HIV on HAART (CD4 380, VL <20 on 12/06/12), and HCV who presents with complaints of SOB and constipation.  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 Discussed w/ patient today. Very unwilling to decrease alcohol intake. Seen by cardiology yesterday, suggested aggressive medical treatment at this time w/ no AICD placement d/t patient's unwillingness to change his alcohol intake. His previous echocardiogram on 05/11/2012 showed moderately dilated LV, severely reduced systolic function (EF 20-25%), grade 1 diastolic dysfunction, moderate MR. -ECHO yesterday showed EF unchanged from prior ECHO. Severely reduced systolic function, diffuse hypokinesis, mild LVH, and LV diastolic dysfunction.  -Continue Spironolactone 12.5 qd, digoxin 0.25 mg qd, and carvedilol to 6.25 qd. -Continue Lasix 40mg  IV BID and home Lisinopril.  #Elevated d-dimer- CTA was negative for PE.  #Chest tightness- On admission, patient complained of an intermittent, non-radiating chest tightness, related to dyspnea on exertion. Does not complain of this today. No dynamic EKG changes seen on admission EKG. -Troponins -ve x3. -Continue ASA  81 mg  #Constipation/abdominal pain- Had a BM in the ED, so SBO unlikely. Lipase WNL. CT showed no acute issues. Still with constipation today. Gave dulcolax for this. Consider other methods if unsuccessful. -TSH 1.10, wnl. -Continue protonix daily -Zofran prn for nausea.  #HIV - Stable. CD4 380, VL <20 on 12/06/12. Followed by Dr. Luciana Axe.  -Continue HAART (etravirine, Prezista, Norvir and Truvada).  #HCV - Stable LFTs.  #HTN - BP low today, 98/64. No tachycardia. Patient asymptomatic  - Continuing lisinopril and lasix as above. Decreased carvedilol to 6.25 qd yesterday.  #DVT PPX - Lovenox subq  Dispo: Disposition is deferred at this time, awaiting improvement of current medical problems.  Anticipated discharge in approximately 2-3 day(s).   The patient does have a current PCP [name], MD) and does need an [facility] hospital follow-up appointment after discharge.  The patient does not have transportation limitations that hinder transportation to clinic appointments.  .Services Needed at time of discharge: Y = Yes, Blank = No PT:   OT:   RN:   Equipment:   Other:     LOS: 2 days   Lars Masson, MD 01/21/2013, 10:29 AM Pager: [phone]

## 2013-01-22 DIAGNOSIS — I509 Heart failure, unspecified: Secondary | ICD-10-CM | POA: Diagnosis not present

## 2013-01-22 LAB — BASIC METABOLIC PANEL
BUN: 19 mg/dL (ref 6–23)
GFR calc non Af Amer: >90 mL/min (ref >90)
Glucose, Bld: 87 mg/dL (ref 70–99)
Potassium: 3.4 mEq/L — ABNORMAL LOW (ref 3.5–5.1)

## 2013-01-22 MED ORDER — DIGOXIN 250 MCG PO TABS: 0.2500 mg | ORAL_TABLET | Freq: Every day | ORAL | Status: DC

## 2013-01-22 MED ORDER — FUROSEMIDE 20 MG PO TABS
20.0000 mg | ORAL_TABLET | Freq: Two times a day (BID) | ORAL | Status: DC
  Filled 2013-01-22 (×2): qty 1

## 2013-01-22 MED ORDER — CARVEDILOL 6.25 MG PO TABS: 6.2500 mg | ORAL_TABLET | Freq: Every day | ORAL | Status: DC

## 2013-01-22 MED ORDER — SPIRONOLACTONE 25 MG PO TABS: 25.0000 mg | ORAL_TABLET | Freq: Every day | ORAL | Status: DC

## 2013-01-22 MED ORDER — BISACODYL 5 MG PO TBEC: 5.0000 mg | DELAYED_RELEASE_TABLET | Freq: Every day | ORAL | Status: DC | PRN

## 2013-01-22 MED ORDER — ENSURE COMPLETE PO LIQD: 237.0000 mL | Freq: Two times a day (BID) | ORAL | Status: DC

## 2013-01-22 MED ORDER — POTASSIUM CHLORIDE CRYS ER 20 MEQ PO TBCR
40.0000 meq | EXTENDED_RELEASE_TABLET | Freq: Once | ORAL | Status: AC
  Administered 2013-01-22: 40 meq via ORAL
  Filled 2013-01-22: qty 2

## 2013-01-22 NOTE — Progress Notes (Deleted)
Name: [name] MRN: [id number] DOB: 01-22-1966 47 y.o. PCP: [name], MD  Date of Admission: 01/19/2013  4:46 PM Date of Discharge: 01/22/2013 Attending Physician: No att. providers found  Discharge Diagnosis: 1. Acute on chronic CHF- Patient presented w/ SOB. Has a known history of CHF, w/ previous EF of 20-25% in 05/2012. Repeat ECHO showed worsening systolic function.  2. HIV- Compliant w/ medications; on etravirine and truvada. 3. HTN 4. Alcohol abuse- drinks ~6 beers/day. Refuses to change his drinking habits despite his worsening EtOh cardiomyopathy 5. GERD 6. Constipation 7. HCV  Discharge Medications:   Medication List         bisacodyl 5 MG EC tablet  Commonly known as:  DULCOLAX  Take 1 tablet (5 mg total) by mouth daily as needed.     carvedilol 6.25 MG tablet  Commonly known as:  COREG  Take 1 tablet (6.25 mg total) by mouth daily with breakfast.     digoxin 0.25 MG tablet  Commonly known as:  LANOXIN  Take 1 tablet (0.25 mg total) by mouth daily.     emtricitabine-tenofovir 200-300 MG per tablet  Commonly known as:  TRUVADA  Take 1 tablet by mouth daily.     etravirine 100 MG tablet  Commonly known as:  INTELENCE  Take 2 tablets (200 mg total) by mouth 2 (two) times daily with a meal.     feeding supplement Liqd  Take 237 mLs by mouth 2 (two) times daily between meals.     furosemide 20 MG tablet  Commonly known as:  LASIX  Take 20 mg by mouth 2 (two) times daily.     ibuprofen 200 MG tablet  Commonly known as:  ADVIL,MOTRIN  Take 400 mg by mouth every 6 (six) hours as needed for pain.     lisinopril 40 MG tablet  Commonly known as:  PRINIVIL,ZESTRIL  Take 1 tablet (40 mg total) by mouth daily.     omeprazole 20 MG capsule  Commonly known as:  PRILOSEC  Take 1 capsule (20 mg total) by mouth daily.     ritonavir 100 MG capsule  Commonly known as:  NORVIR  Take 1 capsule (100 mg total) by mouth 2 (two) times daily.     spironolactone 25 MG tablet  Commonly known as:  ALDACTONE  Take 1 tablet (25 mg total) by mouth daily.     valACYclovir 1000 MG tablet  Commonly known as:  VALTREX  Take 1 tablet (1,000 mg total) by mouth 2 (two) times daily. Take for 5 days for an outbreak        Disposition and follow-up:   [name] was discharged from [facility] in Good condition.  At the hospital follow up visit please address:  1.  Compliance w/ new medication regimen (spironolactone and digoxin), presence of SOB, relief of symptoms since discharge. Discuss patient's alcohol use and its effect on his heart involving EtOh CMP.  2.  Labs / imaging needed at time of follow-up: BMET; patient had hypokalemia during admission 2/2 diuretic use. Reassess electrolyte status.  3.  Pending labs/ test needing follow-up: none  Follow-up Appointments:     Follow-up Information   Follow up with [name], MD. (CHF clinic will call you some time early in the week for your appointment. If they do not contact you, please call the number listed below.)    Specialty:  Cardiology   Contact information:   [address]  [address] [place] [address] [phone]       Follow up with [name], MD. ([facility] clinic will call you with your appointment early this week. If they do not, please call the number listed below.)    Specialty:  Infectious Diseases   Contact information:   [address] [place] [address] [phone]       Discharge Instructions: Discharge Orders   Future Appointments Provider Department Dept Phone   04/10/2013 10:00 AM [facility] for [facility]ious Disease [phone]   04/24/2013 9:45 AM [name], MD [facility] for Infectious Disease [phone]   Future Orders Complete By Expires   Call MD for:  difficulty breathing, headache or visual disturbances  As directed    Call MD for:   extreme fatigue  As directed    Call MD for:  persistant dizziness or light-headedness  As directed    Call MD for:  severe uncontrolled pain  As directed    Diet - low sodium heart healthy  As directed    Increase activity slowly  As directed       Consultations: Treatment Team:  R[facility], MD  Procedures Performed:  Ct Abdomen Pelvis Wo Contrast  01/19/2013   *RADIOLOGY REPORT*  Clinical Data: Severe abdominal pain.  CT ABDOMEN AND PELVIS WITHOUT CONTRAST  Technique:  Multidetector CT imaging of the abdomen and pelvis was performed following the standard protocol without intravenous contrast.  Comparison: Single view of the abdomen 12/13/2011.  CT chest, abdomen and pelvis 11/30/2011.  Findings: There is massive cardiomegaly.  No pleural or pericardial effusion.  Lung bases demonstrate some dependent atelectasis.  An unchanged cyst is seen off the lower pole of the left kidney. Small calcification is again identified within the inferior aspect of the cyst.  There is no hydronephrosis on the right or left and no ureteral stones are identified.  The stomach, small and large bowel and appendix appear normal.  The liver, adrenal glands, spleen and pancreas appear normal.  No lymphadenopathy or fluid is identified.  There is no focal bony abnormality.  IMPRESSION:  1.  No acute finding. 2.  Marked cardiomegaly. 3.  No change in a cyst off the lower pole of the left kidney with a small calcification within it.   Original Report Authenticated By: Holley Dexter, M.D.   Dg Chest 2 View  01/19/2013   *RADIOLOGY REPORT*  Clinical Data: Chest pain, shortness of breath.  CHEST - 2 VIEW  Comparison: December 12, 2011.  Findings: Stable cardiomegaly.  No pleural effusion or pneumothorax is noted.  Stable mild central pulmonary vascular congestion is noted.  Kerley B lines are noted laterally in right lung base which may represent minimal interstitial edema.  No pneumonia or atelectasis is noted.   IMPRESSION: Minimal Kerley B lines seen laterally in right lung base which may represent minimal pulmonary edema.  Stable cardiomegaly and central pulmonary vascular congestion compared to prior exam.   Original Report Authenticated By: Lupita Raider.,  M.D.   Ct Angio Chest Pe W/cm &/or Wo Cm  01/20/2013   *RADIOLOGY REPORT*  Clinical Data: Shortness of breath.  Left-sided chest pain. Positive D-dimer.  CT ANGIOGRAPHY CHEST  Technique:  Multidetector CT imaging of the chest using the standard protocol during bolus administration of intravenous contrast. Multiplanar reconstructed images including MIPs were obtained and reviewed to evaluate the vascular anatomy.  Contrast: OMNIPAQUE IOHEXOL 350 MG/ML SOLN  Comparison: Chest x-ray dated 01/19/2013 and CT scan dated 11/30/2011  Findings: There are no pulmonary emboli.  There is chronic peribronchial thickening with chronic cardiomegaly.  No effusions. Progressive emphysematous blebs in both lungs.  Small mediastinal lymph nodes are stable.  No osseous abnormality.  Tiny pericardial effusion.  IMPRESSION:  1.  No pulmonary emboli. 2.  Chronic cardiomegaly with a tiny pericardial effusion. 3.  Slight progression of scattered emphysematous blebs. 4.  Chronic bronchitic changes.   Original Report Authenticated By: Francene Boyers, M.D.    2D Echo: 01/20/13  Study Conclusions: - Left ventricle: The cavity size was moderately to severely dilated. Wall thickness was increased in a pattern of mild LVH. Systolic function was severely reduced. The estimated ejection fraction was in the range of 20% to 25%. Diffuse hypokinesis. Findings consistent with left ventricular diastolic dysfunction. - Aortic valve: Mild regurgitation. - Mitral valve: Moderate regurgitation. - Left atrium: The atrium was moderately dilated. - Right ventricle: The cavity size was mildly dilated. Systolic function was mildly reduced. - Right atrium: The atrium was mildly dilated. -  Tricuspid valve: Mild-moderate regurgitation. - Pulmonary arteries: PA peak pressure: 47mm Hg (S).  Impressions: LV is more dilated since 05/2013.  Admission HPI:  [name] is a 47 y.o. male PMH of NICM, chronic systolic CHF (EF 16-10% in 05/11/2012), HTN, HIV on HAART (CD4 380, VL <20 on 12/06/12), and HCV who presents with complaints of SOB and constipation.  His shortness of breath began about 1 month ago, most notable with exertion. He now becomes dyspneic just walking down the driveway to his mailbox. He denies orthopnea, PND, leg swelling, but does endorse a ~22lb weight gain over the summer, from about 140 to 162lbs. He has been complaint with all of his medications, including those for heart failure. However, he continues to use alcohol, approximately 1 six pack of "Natty Light" beer daily. He has been socializing more over the summer, so he admits his alcohol intake has increased somewhat. He has no history of alcohol withdrawal symptoms or seizures, and his last drink was 4 days ago. He also smokes marijuana daily. Denies current drug use, but was a regular cocaine user until about 3 years ago. Denies chest pain, but notes an intermittent sensation of "tightness" when he becomes dyspneic with exertion, non-radiating. No fevers or chills.  Mr. Ghosh also complains of constipation since Monday (01/16/13). It is associated with epigastric abdominal pain, nausea, and NBNB vomiting. He has had decreased food intake 2/2 the vomiting, but endorses good fluid intake. He has been having trouble passing gas as well. No history of abdominal surgeries. Of note, the patient was admitted for very similar complaints in 11/2011. Extensive workup was performed including CT chest/abdomen, abdominal U/S, HIDA scan, EGD, and gastric emptying study, which were unrevealing. He was found to be in a CHF exacerbation at that time. According to the patient, his abdominal complaints eventually resolved with  diuretic therapy.  In the ED, a CT abdomen/pelvis was performed and showed no acute abnormalities. Of note the patient did have a watery BM after drinking his po contrast. Laboratory workup was remarkable for hypokalemia (3.3), for which he was given K-Dur po, and elevated pro-BNP (8265, last measurement was 173 on 08/23/12). CXR showed Kerley B lines on the right, suggestive of mild pulmonary edema. He received Lasix 40mg  IV. He was also given IV Protonix and a GI cocktail x2 for his epigastric pain, with some relief.   Hospital  Course by problem list: Principal Problem:   Acute exacerbation of CHF (congestive heart failure) systolic Active Problems:   HIV DISEASE   HYPERTENSION   HIV (human immunodeficiency virus infection)   Alcohol abuse   Marijuana abuse   Cardiomyopathy   GERD (gastroesophageal reflux disease)   Abdominal  pain, other specified site   Acute on chronic systolic heart failure   1. Acute on chronic CHF- Patient presented w/ worsenining SOB for > 1 month. Says he has DOE when only walking a very short distance and says at times it only takes about 10 feet. He has a known history of CHF noted from an ECHO in 05/2012, showing severely diminished cardiac function w/ an EF of 20-25%. He denied any symptoms of orthopnea, PND or leg swelling. He also endorses some recent weight gain. Patietn follows w/ Dr. Ladona Ridgel. On admission to the ED, patient had crackles on exam, Kerley B lines on CXR, and pBNP of 8265 (173 in March). 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 His previous ECHO in 05/2012 showed moderately dilated LV, severely reduced systolic function (EF 20-25%), grade 1 diastolic dysfunction, and moderate MR. He was admitted to the tele unit and cardiology was consulted. He was started on Lasix 40 mg bid (on 20 bid at home), Carvedilol home dose was decreased to 6.25 mg  qd, Digoxin 0.25 mg qd and spironolactone 12.5 mg qd. A repeat ECHO was performed and showed the same EF as previous, however, the LV was more severely dilated than before. Given his complete refusal to change his alcohol use, it was not felt that he was an AICD or CRT-D therapy candidate. The patient diuresed very well during the course of his admission and his breathing got progressively better. On the day of discharge, the patient denied any SOB and said he felt much better than before. On discharge, he was continued on Carvedilol 6.25 mg qd, Spironolactone 25 mg qd, Lasix 20 mg bid, and Digoxin 0.25 mg qd. Will follow up in HF clinic w/in 1-2 weeks.   2. HIV- Patient has not had any recent issues w/ HIV. Says he has been very compliant with his medications. He takes Etravirine, Prezista, Norvir, and Truvada. Last CD4 and viral load was 380 and <20, respectively. Follows w/ Dr. Luciana Axe in [facility] and will follow up within the next 2 weeks.  3. HTN- Blood pressure was very well controlled during his admission. He takes Lisinopril, Lasix, and Carvedilol at home. All of these meds were continued in the hospital.   4. Alcohol Abuse- The patient has a long history of alcohol abuse and used to use cocaine several years ago, but has since stopped. He claims that he drinks ~6 beers/day and has for quite some time. After a long discussion, he refuses to decrease his alcohol intake. 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 Due to his significant alcohol intake and refusal to later his behavior, it was made clear to the patient that he is not a candidate for AICD because of this.   5. Constipation- Patient also presented complaining of not having a bowel movement for some time. His first day of admission he still had not moved his bowels, but over time, after some Miralax, this issue quickly resolved.  6. HCV- Stable during this admission. LFT's  normal.  Discharge Vitals:   BP  107/83  Pulse 78  Temp(Src) 97.5 F (36.4 C) (Oral)  Resp 18  Ht 5\' 5"  (1.651 m)  Wt 150 lb 9.6 oz (68.312 kg)  BMI 25.06 kg/m2  SpO2 95%  Discharge Labs:  Results for orders placed during the hospital encounter of 01/19/13 (from the past 24 hour(s))  BASIC METABOLIC PANEL     Status: Abnormal   Collection Time    01/21/13  5:05 PM      Result Value Range   Sodium 139  135 - 145 mEq/L   Potassium 3.8  3.5 - 5.1 mEq/L   Chloride 102  96 - 112 mEq/L   CO2 22  19 - 32 mEq/L   Glucose, Bld 98  70 - 99 mg/dL   BUN 18  6 - 23 mg/dL   Creatinine, Ser 0.98  0.50 - 1.35 mg/dL   Calcium 9.6  8.4 - 11.9 mg/dL   GFR calc non Af Amer 64 (*) >90 mL/min   GFR calc Af Amer 74 (*) >90 mL/min  BASIC METABOLIC PANEL     Status: Abnormal   Collection Time    01/22/13  4:45 AM      Result Value Range   Sodium 139  135 - 145 mEq/L   Potassium 3.4 (*) 3.5 - 5.1 mEq/L   Chloride 102  96 - 112 mEq/L   CO2 24  19 - 32 mEq/L   Glucose, Bld 87  70 - 99 mg/dL   BUN 19  6 - 23 mg/dL   Creatinine, Ser 1.47  0.50 - 1.35 mg/dL   Calcium 9.4  8.4 - 82.9 mg/dL   GFR calc non Af Amer >90  >90 mL/min   GFR calc Af Amer >90  >90 mL/min    Signed: Lars Masson, MD 01/22/2013, 3:53 PM   Time Spent on Discharge: 35 minutes Services Ordered on Discharge: none Equipment Ordered on Discharge: none

## 2013-01-22 NOTE — Progress Notes (Signed)
   Subjective:  Denies CP or dyspnea    Objective:  Filed Vitals:   01/21/13 1439 01/21/13 2031 01/22/13 0645 01/22/13 0711  BP: 116/80 111/79  107/83  Pulse: 81 81  78  Temp: 97.7 F (36.5 C) 98.8 F (37.1 C)  97.5 F (36.4 C)  TempSrc: Oral Oral  Oral  Resp: 17 18  18   Height:      Weight:   150 lb 9.6 oz (68.312 kg)   SpO2: 98% 97%  95%    Intake/Output from previous day:  Intake/Output Summary (Last 24 hours) at 01/22/13 1023 Last data filed at 01/22/13 7846  Gross per 24 hour  Intake   1185 ml  Output    950 ml  Net    235 ml    Physical Exam: Physical exam: Well-developed well-nourished in no acute distress.  Skin is warm and dry.  HEENT is normal.  Neck is supple.  Chest is clear to auscultation with normal expansion.  Cardiovascular exam is regular rate and rhythm.  Abdominal exam nontender or distended. No masses palpated. Extremities show no edema. neuro grossly intact    Lab Results: Basic Metabolic Panel:  Recent Labs  96/29/52 2300  01/21/13 1705 01/22/13 0445  NA  --   < > 139 139  K  --   < > 3.8 3.4*  CL  --   < > 102 102  CO2  --   < > 22 24  GLUCOSE  --   < > 98 87  BUN  --   < > 18 19  CREATININE 1.10  < > 1.28 0.99  CALCIUM  --   < > 9.6 9.4  MG 2.1  --   --   --   < > = values in this interval not displayed. CBC:  Recent Labs  01/19/13 1630 01/19/13 2300  WBC 8.3 7.8  HGB 13.3 13.6  HCT 38.9* 39.8  MCV 86.1 86.0  PLT 251 253   Cardiac Enzymes:  Recent Labs  01/19/13 2238 01/20/13 0438 01/20/13 1030  TROPONINI <0.30 <0.30 <0.30     Assessment/Plan:  1. Acute/chronis systolic HF due to NICM  --EF 84-13KG LVIDd 6.7 cm  2. Ongoing ETOH abuse  3. HIV  4. HCV  5. Moderate MR  6. Hypokalemia Symptoms improved; change lasix to 20 mg po BID; change spironolactone to 25 mg daily; supplement K. Discussed importance of avoiding ETOH. Long term prognosis poor if he does not follow these instructions. Patient can be  DCed from a cardiac standpoint. Would check BMET one week. Would have patient fu in CHF clinic in 2-4 weeks.   Olga Millers 01/22/2013, 10:23 AM

## 2013-01-22 NOTE — ED Provider Notes (Signed)
I saw and evaluated the patient, reviewed the resident's note and I agree with the findings and plan. Pt with multiple complaints but most concerned with increasing DOE. VS stable. Crackle in lung bases. Work up suggestive of CHF. Will admit.   Loren Racer, MD 01/22/13 [phone]

## 2013-01-22 NOTE — Progress Notes (Addendum)
Subjective: [name] is a 47 y.o. male PMH of NICM, chronic systolic CHF (EF 95-62% in 05/11/2012), HTN, HIV on HAART (CD4 380, VL <20 on 12/06/12), and HCV who presents with complaints of SOB and constipation.  Patient seen at bedside today. Says he feels great today. No SOB, chest pain, nausea, vomiting, or constipation.  Seen by cardiology today, feel he is okay for discharge. Will continue digoxin, spironolactone, lasix and carvedilol on discharge (see details in A/P).  Objective: Vital signs in last 24 hours: Filed Vitals:   01/21/13 1439 01/21/13 2031 01/22/13 0645 01/22/13 0711  BP: 116/80 111/79  107/83  Pulse: 81 81  78  Temp: 97.7 F (36.5 C) 98.8 F (37.1 C)  97.5 F (36.4 C)  TempSrc: Oral Oral  Oral  Resp: 17 18  18   Height:      Weight:   150 lb 9.6 oz (68.312 kg)   SpO2: 98% 97%  95%   Weight change: -2.7 oz (-0.077 kg)  Intake/Output Summary (Last 24 hours) at 01/22/13 1308 Last data filed at 01/22/13 6578  Gross per 24 hour  Intake   1185 ml  Output    950 ml  Net    235 ml   Physical Exam: General: Alert, cooperative, and in no apparent distress HEENT: Vision grossly intact, oropharynx clear and non-erythematous  Neck: Full range of motion without pain, supple, no lymphadenopathy or carotid bruits Lungs: Clear to ascultation bilaterally, normal work of respiration, no wheezes, rales, ronchi Heart: Regular rate and rhythm, no murmurs heard, S3 heard. Abdomen: Soft, non-tender, non-distended, normal bowel sounds Extremities: No cyanosis, clubbing, or edema Neurologic: Alert & oriented X3, cranial nerves II-XII intact, strength grossly intact, sensation intact to light touch  Lab Results: Basic Metabolic Panel:  Recent Labs Lab 01/19/13 2300  01/21/13 1705 01/22/13 0445  NA  --   < > 139 139  K  --   < > 3.8 3.4*  CL  --   < > 102 102  CO2  --   < > 22 24  GLUCOSE  --   < > 98 87  BUN  --   < > 18 19  CREATININE 1.10  < > 1.28 0.99    CALCIUM  --   < > 9.6 9.4  MG 2.1  --   --   --   < > = values in this interval not displayed. Liver Function Tests:  Recent Labs Lab 01/19/13 1756  AST 38*  ALT 34  ALKPHOS 82  BILITOT 1.0  PROT 7.7  ALBUMIN 3.9    Recent Labs Lab 01/19/13 1756  LIPASE 18   No results found for this basename: AMMONIA,  in the last 168 hours CBC:  Recent Labs Lab 01/19/13 1630 01/19/13 2300  WBC 8.3 7.8  HGB 13.3 13.6  HCT 38.9* 39.8  MCV 86.1 86.0  PLT 251 253   Cardiac Enzymes:  Recent Labs Lab 01/19/13 2238 01/20/13 0438 01/20/13 1030  TROPONINI <0.30 <0.30 <0.30   BNP:  Recent Labs Lab 01/19/13 1630  PROBNP 8265.0*   D-Dimer:  Recent Labs Lab 01/19/13 2154  DDIMER 1.35*   Thyroid Function Tests:  Recent Labs Lab 01/19/13 2300  TSH 1.098   Studies/Results: No results found. Medications: I have reviewed the patient's current medications. Scheduled Meds: . aspirin EC  81 mg Oral Daily  . carvedilol  6.25 mg Oral Q breakfast  . darunavir  800 mg Oral Q  breakfast  . digoxin  0.25 mg Oral Daily  . emtricitabine-tenofovir  1 tablet Oral Daily  . enoxaparin (LOVENOX) injection  40 mg Subcutaneous Q24H  . etravirine  200 mg Oral BID WC  . feeding supplement  237 mL Oral BID BM  . furosemide  40 mg Intravenous BID  . lisinopril  40 mg Oral Daily  . pantoprazole  40 mg Oral Daily  . potassium chloride  40 mEq Oral Once  . ritonavir  100 mg Oral Q breakfast  . sodium chloride  3 mL Intravenous Q12H  . spironolactone  12.5 mg Oral Daily   Continuous Infusions:  PRN Meds:.sodium chloride, bisacodyl, ondansetron (ZOFRAN) IV, sodium chloride, zolpidem Assessment/Plan: [name] is a 47 y.o. male PMH of NICM, chronic systolic CHF (EF 84-69% in 05/11/2012), HTN, HIV on HAART (CD4 380, VL <20 on 12/06/12), and HCV who presents with complaints of SOB and constipation.  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 Very unwilling to  decrease alcohol intake. Seen by cardiology, only suggested aggressive medical treatment at this time w/ no AICD placement d/t patient's unwillingness to change his alcohol intake. His previous echocardiogram on 05/11/2012 showed moderately dilated LV, severely reduced systolic function (EF 20-25%), grade 1 diastolic dysfunction, moderate MR. ECHO on 01/20/13 showed EF unchanged from prior ECHO. Severely reduced systolic function, diffuse hypokinesis, mild LVH, and LV diastolic dysfunction.  -Seen by cardiology today, feel that patient is okay for discharge. They gave the following discharge recommendations: -Continue home dose of Lasix 20 mg bid -Continue Spironolactone 25 mg qd -Continue Digoxin 0.25 mg qd -Continue low dose carvedilol, 6.25 qd -Patient will follow up w/ CHF clinic in 2-4 weeks.  #Elevated d-dimer- CTA was negative for PE.  #Chest tightness- On admission, patient complained of an intermittent, non-radiating chest tightness, related to dyspnea on exertion. Has not complained of this since admission. -Troponins -ve x3 w/ no dynamic changes on EKG. -Continue ASA 81 mg  #Constipation/abdominal pain- Had BM yesterday w/ administration of Dulcolax.  -TSH 1.10, wnl. -Continue protonix daily -Zofran prn for nausea.  #HIV - Stable. CD4 380, VL <20 on 12/06/12. Followed by Dr. Luciana Axe. Will schedule f/u in [facility] at discharge. -Continue HAART (etravirine, Prezista, Norvir and Truvada).  #HCV - Stable LFTs.  #HTN - BP 107/83 today. No tachycardia.  - Continuing lisinopril and lasix as above. Decreased carvedilol to 6.25 qd.  #DVT PPX - Lovenox subq  Dispo: Patient in good condition. Ready for discharge today.  The patient does have a current PCP [name], MD) and does need an [facility] hospital follow-up appointment after discharge.  The patient does not have transportation limitations that hinder transportation to clinic appointments.  .Services Needed at time of discharge: Y =  Yes, Blank = No PT: N  OT: N  RN: N  Equipment: N  Other: N    LOS: 3 days   Lars Masson, MD 01/22/2013, 9:33 AM Pager: [phone]

## 2013-01-23 ENCOUNTER — Other Ambulatory Visit: Payer: Self-pay | Admitting: Licensed Clinical Social Worker

## 2013-01-23 ENCOUNTER — Telehealth: Payer: Self-pay | Admitting: *Deleted

## 2013-01-23 DIAGNOSIS — B20 Human immunodeficiency virus [HIV] disease: Secondary | ICD-10-CM

## 2013-01-23 MED ORDER — DARUNAVIR ETHANOLATE 800 MG PO TABS: 800.0000 mg | ORAL_TABLET | Freq: Every day | ORAL | Status: DC

## 2013-01-23 NOTE — Telephone Encounter (Signed)
A user error has taken place.

## 2013-01-24 ENCOUNTER — Telehealth: Payer: Self-pay | Admitting: *Deleted

## 2013-01-24 NOTE — Telephone Encounter (Signed)
Pt requesting a hospital f/u appointment with Dr. Luciana Axe. Pt last seen 8/21 by Dr. Luciana Axe, who sent him to the ED for evaluation. Pt was admitted from ED, discharged 01/22/13.  OK to overbook?

## 2013-01-24 NOTE — Telephone Encounter (Signed)
Appointment given Thursday 01/26/13 11:30 (15 min). Pt notified.

## 2013-01-24 NOTE — Discharge Summary (Signed)
Name: [name] MRN: [id number] DOB: 06-28-1965 47 y.o. PCP: [name], MD  Date of Admission: 01/19/2013  4:46 PM Date of Discharge: 01/22/2013 Attending Physician: No att. providers found  Discharge Diagnosis: 1. Acute on chronic CHF- Patient presented w/ SOB. Has a known history of CHF, w/ previous EF of 20-25% in 05/2012. Repeat ECHO showed worsening systolic function.  2. HIV- Compliant w/ medications; on etravirine and truvada. 3. HTN 4. Alcohol abuse- Drinks ~6 beers/day. 5. GERD 6. Constipation 7. HCV  Discharge Medications:   Medication List         bisacodyl 5 MG EC tablet  Commonly known as:  DULCOLAX  Take 1 tablet (5 mg total) by mouth daily as needed.     carvedilol 6.25 MG tablet  Commonly known as:  COREG  Take 1 tablet (6.25 mg total) by mouth daily with breakfast.     digoxin 0.25 MG tablet  Commonly known as:  LANOXIN  Take 1 tablet (0.25 mg total) by mouth daily.     emtricitabine-tenofovir 200-300 MG per tablet  Commonly known as:  TRUVADA  Take 1 tablet by mouth daily.     etravirine 100 MG tablet  Commonly known as:  INTELENCE  Take 2 tablets (200 mg total) by mouth 2 (two) times daily with a meal.     feeding supplement Liqd  Take 237 mLs by mouth 2 (two) times daily between meals.     furosemide 20 MG tablet  Commonly known as:  LASIX  Take 20 mg by mouth 2 (two) times daily.     ibuprofen 200 MG tablet  Commonly known as:  ADVIL,MOTRIN  Take 400 mg by mouth every 6 (six) hours as needed for pain.     lisinopril 40 MG tablet  Commonly known as:  PRINIVIL,ZESTRIL  Take 1 tablet (40 mg total) by mouth daily.     omeprazole 20 MG capsule  Commonly known as:  PRILOSEC  Take 1 capsule (20 mg total) by mouth daily.     ritonavir 100 MG capsule  Commonly known as:  NORVIR  Take 1 capsule (100 mg total) by mouth 2 (two) times daily.     spironolactone 25 MG tablet  Commonly known as:  ALDACTONE  Take 1 tablet (25 mg  total) by mouth daily.     valACYclovir 1000 MG tablet  Commonly known as:  VALTREX  Take 1 tablet (1,000 mg total) by mouth 2 (two) times daily. Take for 5 days for an outbreak        Disposition and follow-up:   Mr.Kyheem L Meharg was discharged from [facility] in Good condition.  At the hospital follow up visit please address:  1.  Compliance w/ new medication regimen (spironolactone and digoxin), presence of SOB, relief of symptoms since discharge. Discuss patient's alcohol use and its effect on his heart involving EtOh CMP.  2.  Labs / imaging needed at time of follow-up: BMET; patient had hypokalemia during admission 2/2 diuretic use. Reassess electrolyte status.  3.  Pending labs/ test needing follow-up: none  Follow-up Appointments:     Follow-up Information   Follow up with [name], MD. (CHF clinic will call you some time early in the week for your appointment. If they do not contact you, please call the number listed below.)    Specialty:  Cardiology   Contact information:   [address] [place] [address] [phone]  Follow up with [name], MD. ([facility] clinic will call you with your appointment early this week. If they do not, please call the number listed below.)    Specialty:  Infectious Diseases   Contact information:   [address] [place] [address] [phone]       Discharge Instructions: Discharge Orders   Future Appointments Provider Department Dept Phone   04/10/2013 10:00 AM [facility] for Infectious Disease [phone]   04/24/2013 9:45 AM [name], MD [facility] for Infectious Disease [phone]   Future Orders Complete By Expires   Call MD for:  difficulty breathing, headache or visual disturbances  As directed    Call MD for:  extreme fatigue  As directed    Call MD for:  persistant dizziness or light-headedness   As directed    Call MD for:  severe uncontrolled pain  As directed    Diet - low sodium heart healthy  As directed    Increase activity slowly  As directed       Consultations: Treatment Team:  Rounding Lbcardiology, MD  Procedures Performed:  Ct Abdomen Pelvis Wo Contrast  01/19/2013   *RADIOLOGY REPORT*  Clinical Data: Severe abdominal pain.  CT ABDOMEN AND PELVIS WITHOUT CONTRAST  Technique:  Multidetector CT imaging of the abdomen and pelvis was performed following the standard protocol without intravenous contrast.  Comparison: Single view of the abdomen 12/13/2011.  CT chest, abdomen and pelvis 11/30/2011.  Findings: There is massive cardiomegaly.  No pleural or pericardial effusion.  Lung bases demonstrate some dependent atelectasis.  An unchanged cyst is seen off the lower pole of the left kidney. Small calcification is again identified within the inferior aspect of the cyst.  There is no hydronephrosis on the right or left and no ureteral stones are identified.  The stomach, small and large bowel and appendix appear normal.  The liver, adrenal glands, spleen and pancreas appear normal.  No lymphadenopathy or fluid is identified.  There is no focal bony abnormality.  IMPRESSION:  1.  No acute finding. 2.  Marked cardiomegaly. 3.  No change in a cyst off the lower pole of the left kidney with a small calcification within it.   Original Report Authenticated By: Holley Dexter, M.D.   Dg Chest 2 View  01/19/2013   *RADIOLOGY REPORT*  Clinical Data: Chest pain, shortness of breath.  CHEST - 2 VIEW  Comparison: December 12, 2011.  Findings: Stable cardiomegaly.  No pleural effusion or pneumothorax is noted.  Stable mild central pulmonary vascular congestion is noted.  Kerley B lines are noted laterally in right lung base which may represent minimal interstitial edema.  No pneumonia or atelectasis is noted.  IMPRESSION: Minimal Kerley B lines seen laterally in right lung base which may represent  minimal pulmonary edema.  Stable cardiomegaly and central pulmonary vascular congestion compared to prior exam.   Original Report Authenticated By: Lupita Raider.,  M.D.   Ct Angio Chest Pe W/cm &/or Wo Cm  01/20/2013   *RADIOLOGY REPORT*  Clinical Data: Shortness of breath.  Left-sided chest pain. Positive D-dimer.  CT ANGIOGRAPHY CHEST  Technique:  Multidetector CT imaging of the chest using the standard protocol during bolus administration of intravenous contrast. Multiplanar reconstructed images including MIPs were obtained and reviewed to evaluate the vascular anatomy.  Contrast: OMNIPAQUE IOHEXOL 350 MG/ML SOLN  Comparison: Chest x-ray dated 01/19/2013 and CT scan dated 11/30/2011  Findings: There  are no pulmonary emboli.  There is chronic peribronchial thickening with chronic cardiomegaly.  No effusions. Progressive emphysematous blebs in both lungs.  Small mediastinal lymph nodes are stable.  No osseous abnormality.  Tiny pericardial effusion.  IMPRESSION:  1.  No pulmonary emboli. 2.  Chronic cardiomegaly with a tiny pericardial effusion. 3.  Slight progression of scattered emphysematous blebs. 4.  Chronic bronchitic changes.   Original Report Authenticated By: Francene Boyers, M.D.    2D Echo: 01/20/13  Study Conclusions: - Left ventricle: The cavity size was moderately to severely dilated. Wall thickness was increased in a pattern of mild LVH. Systolic function was severely reduced. The estimated ejection fraction was in the range of 20% to 25%. Diffuse hypokinesis. Findings consistent with left ventricular diastolic dysfunction. - Aortic valve: Mild regurgitation. - Mitral valve: Moderate regurgitation. - Left atrium: The atrium was moderately dilated. - Right ventricle: The cavity size was mildly dilated. Systolic function was mildly reduced. - Right atrium: The atrium was mildly dilated. - Tricuspid valve: Mild-moderate regurgitation. - Pulmonary arteries: PA peak pressure:  47mm Hg (S).  Impressions: LV is more dilated since 05/2013.  Admission HPI:  Mr. MICHAELANTHONY KEMPTON is a 47 y.o. male PMH of NICM, chronic systolic CHF (EF 16-10% in 05/11/2012), HTN, HIV on HAART (CD4 380, VL <20 on 12/06/12), and HCV who presents with complaints of SOB and constipation.  His shortness of breath began about 1 month ago, most notable with exertion. He now becomes dyspneic just walking down the driveway to his mailbox. He denies orthopnea, PND, leg swelling, but does endorse a ~22lb weight gain over the summer, from about 140 to 162lbs. He has been complaint with all of his medications, including those for heart failure. However, he continues to use alcohol, approximately 1 six pack of "Natty Light" beer daily. He has been socializing more over the summer, so he admits his alcohol intake has increased somewhat. He has no history of alcohol withdrawal symptoms or seizures, and his last drink was 4 days ago. He also smokes marijuana daily. Denies current drug use, but was a regular cocaine user until about 3 years ago. Denies chest pain, but notes an intermittent sensation of "tightness" when he becomes dyspneic with exertion, non-radiating. No fevers or chills.  Mr. Stauffer also complains of constipation since Monday (01/16/13). It is associated with epigastric abdominal pain, nausea, and NBNB vomiting. He has had decreased food intake 2/2 the vomiting, but endorses good fluid intake. He has been having trouble passing gas as well. No history of abdominal surgeries. Of note, the patient was admitted for very similar complaints in 11/2011. Extensive workup was performed including CT chest/abdomen, abdominal U/S, HIDA scan, EGD, and gastric emptying study, which were unrevealing. He was found to be in a CHF exacerbation at that time. According to the patient, his abdominal complaints eventually resolved with diuretic therapy.  In the ED, a CT abdomen/pelvis was performed and showed no acute  abnormalities. Of note the patient did have a watery BM after drinking his po contrast. Laboratory workup was remarkable for hypokalemia (3.3), for which he was given K-Dur po, and elevated pro-BNP (8265, last measurement was 173 on 08/23/12). CXR showed Kerley B lines on the right, suggestive of mild pulmonary edema. He received Lasix 40mg  IV. He was also given IV Protonix and a GI cocktail x2 for his epigastric pain, with some relief.   Hospital Course by problem list: Principal Problem:   Acute exacerbation of CHF (congestive  heart failure) systolic Active Problems:   HIV DISEASE   HYPERTENSION   HIV (human immunodeficiency virus infection)   Alcohol abuse   Marijuana abuse   Cardiomyopathy   GERD (gastroesophageal reflux disease)   Abdominal  pain, other specified site   Acute on chronic systolic heart failure   1. Acute on chronic CHF- Patient presented w/ worsenining SOB for > 1 month. Says he has DOE when only walking a very short distance and says at times it only takes about 10 feet. He has a known history of CHF noted from an ECHO in 05/2012, showing severely diminished cardiac function w/ an EF of 20-25%. He denied any symptoms of orthopnea, PND or leg swelling. He also endorses some recent weight gain. Patietn follows w/ Dr. Ladona Ridgel. On admission to the ED, patient had crackles on exam, Kerley B lines on CXR, and pBNP of 8265 (173 in March). 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 His previous ECHO in 05/2012 showed moderately dilated LV, severely reduced systolic function (EF 20-25%), grade 1 diastolic dysfunction, and moderate MR. He was admitted to the tele unit and cardiology was consulted. He was started on Lasix 40 mg bid (on 20 bid at home), Carvedilol home dose was decreased to 6.25 mg qd, Digoxin 0.25 mg qd and spironolactone 12.5 mg qd. A repeat ECHO was performed and  showed the same EF as previous, however, the LV was more severely dilated than before. Cardiology felt that if the patient was not willing to change his alcohol intake, he was an AICD or CRT-D therapy candidate. The patient diuresed very well during the course of his admission and his breathing got progressively better. On the day of discharge, the patient denied any SOB and said he felt much better than before. On discharge, he was continued on Carvedilol 6.25 mg qd, Spironolactone 25 mg qd, Lasix 20 mg bid, and Digoxin 0.25 mg qd. Will follow up in HF clinic w/in 1-2 weeks.   2. HIV- Patient has not had any recent issues w/ HIV. Says he has been very compliant with his medications. He takes Etravirine, Prezista, Norvir, and Truvada. Last CD4 and viral load was 380 and <20, respectively. Follows w/ Dr. Luciana Axe in [facility] and will follow up within the next 2 weeks.  3. HTN- Blood pressure was very well controlled during his admission. He takes Lisinopril, Lasix, and Carvedilol at home. All of these meds were continued in the hospital.   4. Alcohol Abuse- The patient has a long history of alcohol abuse and used to use cocaine several years ago, but has since stopped. He claims that he drinks ~6 beers/day and has for quite some time. On discussion with the patient, he had initially refused to change his habits and cardiology felt that he would not be a candidate for AICD placement if this were the case. However, after some discussion, he will make an attempt to cut back.  5. Constipation- Patient also presented complaining of not having a bowel movement for some time. His first day of admission he still had not moved his bowels, but over time, after some Miralax, this issue quickly resolved.  6. HCV- Stable during this admission. LFT's normal.  Discharge Vitals:   BP 107/83  Pulse 78  Temp(Src) 97.5 F (36.4 C) (Oral)  Resp 18  Ht 5\' 5"  (1.651 m)  Wt 150 lb 9.6 oz (  68.312 kg)  BMI 25.06 kg/m2   SpO2 95%  Discharge Labs:  Results for orders placed during the hospital encounter of 01/19/13 (from the past 24 hour(s))  BASIC METABOLIC PANEL     Status: Abnormal   Collection Time    01/21/13  5:05 PM      Result Value Range   Sodium 139  135 - 145 mEq/L   Potassium 3.8  3.5 - 5.1 mEq/L   Chloride 102  96 - 112 mEq/L   CO2 22  19 - 32 mEq/L   Glucose, Bld 98  70 - 99 mg/dL   BUN 18  6 - 23 mg/dL   Creatinine, Ser 8.29  0.50 - 1.35 mg/dL   Calcium 9.6  8.4 - 56.2 mg/dL   GFR calc non Af Amer 64 (*) >90 mL/min   GFR calc Af Amer 74 (*) >90 mL/min  BASIC METABOLIC PANEL     Status: Abnormal   Collection Time    01/22/13  4:45 AM      Result Value Range   Sodium 139  135 - 145 mEq/L   Potassium 3.4 (*) 3.5 - 5.1 mEq/L   Chloride 102  96 - 112 mEq/L   CO2 24  19 - 32 mEq/L   Glucose, Bld 87  70 - 99 mg/dL   BUN 19  6 - 23 mg/dL   Creatinine, Ser 1.30  0.50 - 1.35 mg/dL   Calcium 9.4  8.4 - 86.5 mg/dL   GFR calc non Af Amer >90  >90 mL/min   GFR calc Af Amer >90  >90 mL/min    Signed: Lars Masson, MD 01/22/2013, 3:53 PM   Time Spent on Discharge: 35 minutes Services Ordered on Discharge: none Equipment Ordered on Discharge: none

## 2013-01-24 NOTE — Telephone Encounter (Signed)
yes

## 2013-01-26 ENCOUNTER — Encounter: Payer: Self-pay | Admitting: Internal Medicine

## 2013-01-26 ENCOUNTER — Ambulatory Visit (INDEPENDENT_AMBULATORY_CARE_PROVIDER_SITE_OTHER): Payer: Medicare Other | Admitting: Internal Medicine

## 2013-01-26 VITALS — BP 96/68 | HR 80 | Temp 98.2°F | Ht 64.5 in | Wt 151.0 lb

## 2013-01-26 DIAGNOSIS — Z23 Encounter for immunization: Secondary | ICD-10-CM

## 2013-01-26 DIAGNOSIS — I428 Other cardiomyopathies: Secondary | ICD-10-CM

## 2013-01-26 DIAGNOSIS — Z7189 Other specified counseling: Secondary | ICD-10-CM | POA: Diagnosis not present

## 2013-01-26 DIAGNOSIS — I426 Alcoholic cardiomyopathy: Secondary | ICD-10-CM

## 2013-01-26 DIAGNOSIS — I429 Cardiomyopathy, unspecified: Secondary | ICD-10-CM

## 2013-01-26 NOTE — Assessment & Plan Note (Signed)
I had a long talk with the patient regarding his wishes at the end of life. With the impending heart failure and possible rapid decline with his heart this was discussed with the patient. He does not wish to be resuscitated, he does not wish to be intubated, he does not wish to be hooked up to any machines. He feels it is in "God hands". At the end of life he was to be happy and hopeful that it is a quick death. He was given information for giving an advanced directive for DO NOT RESUSCITATE.

## 2013-01-26 NOTE — Assessment & Plan Note (Addendum)
I had a long discussion with the patient in regards to his cardiomyopathy. He is aware of the problems with alcohol. Initially he was very adamant to continue drinking since most of his lifestyle as revolved around drinking beer. However after further discussion, he did note that nonalcoholic beer would be sufficient for him since it is more the socialization of severe rather than the actual alcohol content. He therefore is to try nonalcoholic beer and try to abstain from alcohol in order to have more interventions for his heart including CRT-D.  I will see him again in November and we will further discuss this. I will also evaluate him at that time how successful he has been without alcohol. He also was seen today by our substance abuse counselor.  60 minutes was spent with the patient including 35 minutes of counseling for end of life care, alcohol abuse.   ADDENDUM 8/29: rechecked potassium and is mildly elevated but hemolyzed.  No indication for treatment.

## 2013-01-26 NOTE — Progress Notes (Signed)
  Subjective:    Patient ID: [name], male    DOB: Dec 27, 1965, 47 y.o.   MRN: [id number]  HPI Comes in for hospital followup. I saw him last week and he was significantly symptomatic with abdominal pain and shortness of breath. On evaluation, it was an acute exacerbation of his congestive heart failure/cardiomyopathy. It was noted during hospitalization but endorsed much more drinking over the summer and he is now a [place] heart association class IV heart failure. He was stabilized the hospitalization and is doing much better now though he does endorse some fatigue.  He, his alcohol seems to be the biggest issue for his cardiomyopathy.  He though expressed his desire to keep drinking.     Review of Systems  Constitutional: Positive for fatigue. Negative for fever, appetite change and unexpected weight change.  HENT: Negative for sore throat and trouble swallowing.   Eyes: Negative for visual disturbance.  Respiratory: Negative for shortness of breath.   Cardiovascular: Negative for chest pain and leg swelling.  Gastrointestinal: Negative for nausea and diarrhea.  Skin: Negative for rash.  Neurological: Negative for dizziness, light-headedness and headaches.  Hematological: Negative for adenopathy.  Psychiatric/Behavioral: Negative for dysphoric mood.       Objective:   Physical Exam  Constitutional: He is oriented to person, place, and time. He appears well-developed and well-nourished. No distress.  HENT:  Mouth/Throat: No oropharyngeal exudate.  Eyes: Right eye exhibits no discharge. Left eye exhibits no discharge. No scleral icterus.  Cardiovascular: Normal rate, regular rhythm and normal heart sounds.   No murmur heard. Pulmonary/Chest: Effort normal and breath sounds normal. No respiratory distress. He has no wheezes.  Lymphadenopathy:    He has no cervical adenopathy.  Neurological: He is alert and oriented to person, place, and time.  Skin: Skin is warm and dry. No  rash noted. No erythema.  Psychiatric: He has a normal mood and affect. His behavior is normal. Judgment and thought content normal.          Assessment & Plan:

## 2013-01-27 LAB — BASIC METABOLIC PANEL
BUN: 21 mg/dL (ref 6–23)
CO2: 22 mEq/L (ref 19–32)
Calcium: 10.1 mg/dL (ref 8.4–10.5)
Creat: 1.32 mg/dL (ref 0.50–1.35)
Glucose, Bld: 65 mg/dL — ABNORMAL LOW (ref 70–99)
Sodium: 138 mEq/L (ref 135–145)

## 2013-02-06 ENCOUNTER — Encounter (HOSPITAL_COMMUNITY): Payer: Self-pay

## 2013-02-06 ENCOUNTER — Telehealth: Payer: Self-pay | Admitting: *Deleted

## 2013-02-06 ENCOUNTER — Ambulatory Visit (HOSPITAL_COMMUNITY)
Admission: RE | Admit: 2013-02-06 | Discharge: 2013-02-06 | Disposition: A | Discharge status: Home / Self Care | Payer: Medicare Other | Source: Ambulatory Visit | Attending: Internal Medicine | Admitting: Internal Medicine

## 2013-02-06 VITALS — BP 108/86 | HR 70 | Wt 151.4 lb

## 2013-02-06 DIAGNOSIS — I5022 Chronic systolic (congestive) heart failure: Secondary | ICD-10-CM | POA: Diagnosis not present

## 2013-02-06 DIAGNOSIS — R002 Palpitations: Secondary | ICD-10-CM

## 2013-02-06 LAB — PRO B NATRIURETIC PEPTIDE: Pro B Natriuretic peptide (BNP): 1620 pg/mL — ABNORMAL HIGH (ref 0–125)

## 2013-02-06 LAB — BASIC METABOLIC PANEL
CO2: 23 mEq/L (ref 19–32)
Calcium: 10.2 mg/dL (ref 8.4–10.5)
Creatinine, Ser: 0.87 mg/dL (ref 0.50–1.35)
GFR calc non Af Amer: >90 mL/min (ref >90)

## 2013-02-06 MED ORDER — CARVEDILOL 6.25 MG PO TABS: 6.2500 mg | ORAL_TABLET | Freq: Two times a day (BID) | ORAL | Status: DC

## 2013-02-06 NOTE — Patient Instructions (Addendum)
Increase carvedilol to 6.25 mg twice a day.  Will call with lab results.  Follow up in 2-3 weeks.  Doing great, congrats on quitting drinking!!!!  Do the following things EVERYDAY: 1) Weigh yourself in the morning before breakfast. Write it down and keep it in a log. 2) Take your medicines as prescribed 3) Eat low salt foods-Limit salt (sodium) to 2000 mg per day.  4) Stay as active as you can everyday 5) Limit all fluids for the day to less than 2 liters 6)

## 2013-02-06 NOTE — Telephone Encounter (Signed)
RN spoke with Homero Fellers, [facility] Pharmacist.  Pt last filled HIV rxes in June 2014.  Has run out of refills for Norvir, Truvada and Intellence.  Norvir rx was BID dosing.  Next appt with Dr. Luciana Axe is 04/24/13.  Dr. Luciana Axe, please review pt's HIV rxes and advise.  Does the pt need an appointment sooner than 04/24/13?

## 2013-02-07 ENCOUNTER — Telehealth: Payer: Self-pay | Admitting: *Deleted

## 2013-02-07 DIAGNOSIS — B20 Human immunodeficiency virus [HIV] disease: Secondary | ICD-10-CM

## 2013-02-07 MED ORDER — EMTRICITABINE-TENOFOVIR DF 200-300 MG PO TABS
1.0000 | ORAL_TABLET | Freq: Every day | ORAL | Status: DC
Start: 2013-02-07 — End: 2013-02-14

## 2013-02-07 MED ORDER — RITONAVIR 100 MG PO TABS
100.0000 mg | ORAL_TABLET | Freq: Every day | ORAL | Status: DC
Start: 2013-02-07 — End: 2013-02-14

## 2013-02-07 NOTE — Telephone Encounter (Signed)
Spoke with Dr. Luciana Axe.  Continue Truvada, decrease Norvir to once daily.

## 2013-02-12 ENCOUNTER — Inpatient Hospital Stay (HOSPITAL_COMMUNITY)
Admission: EM | Admit: 2013-02-12 | Discharge: 2013-02-14 | DRG: 682 | Disposition: A | Discharge status: Home / Self Care | Payer: Medicare Other | Attending: Internal Medicine | Admitting: Internal Medicine

## 2013-02-12 ENCOUNTER — Emergency Department (HOSPITAL_COMMUNITY): Payer: Medicare Other

## 2013-02-12 ENCOUNTER — Encounter (HOSPITAL_COMMUNITY): Payer: Self-pay | Admitting: *Deleted

## 2013-02-12 ENCOUNTER — Observation Stay (HOSPITAL_COMMUNITY): Payer: Medicare Other

## 2013-02-12 DIAGNOSIS — N179 Acute kidney failure, unspecified: Principal | ICD-10-CM | POA: Diagnosis present

## 2013-02-12 DIAGNOSIS — B20 Human immunodeficiency virus [HIV] disease: Secondary | ICD-10-CM | POA: Diagnosis not present

## 2013-02-12 DIAGNOSIS — R51 Headache: Secondary | ICD-10-CM

## 2013-02-12 DIAGNOSIS — IMO0002 Reserved for concepts with insufficient information to code with codable children: Secondary | ICD-10-CM | POA: Diagnosis not present

## 2013-02-12 DIAGNOSIS — R519 Headache, unspecified: Secondary | ICD-10-CM | POA: Diagnosis present

## 2013-02-12 DIAGNOSIS — R059 Cough, unspecified: Secondary | ICD-10-CM | POA: Diagnosis not present

## 2013-02-12 DIAGNOSIS — Z21 Asymptomatic human immunodeficiency virus [HIV] infection status: Secondary | ICD-10-CM

## 2013-02-12 DIAGNOSIS — R05 Cough: Secondary | ICD-10-CM | POA: Diagnosis not present

## 2013-02-12 DIAGNOSIS — I10 Essential (primary) hypertension: Secondary | ICD-10-CM | POA: Diagnosis present

## 2013-02-12 DIAGNOSIS — E860 Dehydration: Secondary | ICD-10-CM | POA: Diagnosis not present

## 2013-02-12 DIAGNOSIS — R112 Nausea with vomiting, unspecified: Secondary | ICD-10-CM | POA: Diagnosis not present

## 2013-02-12 DIAGNOSIS — N281 Cyst of kidney, acquired: Secondary | ICD-10-CM | POA: Diagnosis not present

## 2013-02-12 DIAGNOSIS — I5022 Chronic systolic (congestive) heart failure: Secondary | ICD-10-CM | POA: Diagnosis not present

## 2013-02-12 DIAGNOSIS — R197 Diarrhea, unspecified: Secondary | ICD-10-CM

## 2013-02-12 DIAGNOSIS — E43 Unspecified severe protein-calorie malnutrition: Secondary | ICD-10-CM | POA: Diagnosis not present

## 2013-02-12 DIAGNOSIS — R002 Palpitations: Secondary | ICD-10-CM | POA: Insufficient documentation

## 2013-02-12 LAB — CBC WITH DIFFERENTIAL/PLATELET
Basophils Absolute: 0 10*3/uL (ref 0.0–0.1)
Basophils Relative: 0 % (ref 0–1)
Eosinophils Absolute: 0.2 10*3/uL (ref 0.0–0.7)
HCT: 48.6 % (ref 39.0–52.0)
Hemoglobin: 16.6 g/dL (ref 13.0–17.0)
MCH: 29 pg (ref 26.0–34.0)
MCHC: 34.2 g/dL (ref 30.0–36.0)
Monocytes Absolute: 0.8 10*3/uL (ref 0.1–1.0)
Monocytes Relative: 7 % (ref 3–12)
Neutrophils Relative %: 72 % (ref 43–77)
RDW: 13.6 % (ref 11.5–15.5)

## 2013-02-12 LAB — PROTIME-INR
INR: 0.96 (ref 0.00–1.49)
Prothrombin Time: 12.6 seconds (ref 11.6–15.2)

## 2013-02-12 LAB — URINALYSIS, ROUTINE W REFLEX MICROSCOPIC
Leukocytes, UA: NEGATIVE
Nitrite: NEGATIVE
Specific Gravity, Urine: 1.026 (ref 1.005–1.030)
pH: 5 (ref 5.0–8.0)

## 2013-02-12 LAB — COMPREHENSIVE METABOLIC PANEL
Albumin: 4.3 g/dL (ref 3.5–5.2)
BUN: 41 mg/dL — ABNORMAL HIGH (ref 6–23)
Creatinine, Ser: 1.92 mg/dL — ABNORMAL HIGH (ref 0.50–1.35)
Total Protein: 8.6 g/dL — ABNORMAL HIGH (ref 6.0–8.3)

## 2013-02-12 LAB — LACTATE DEHYDROGENASE: LDH: 237 U/L (ref 94–250)

## 2013-02-12 LAB — LIPASE, BLOOD: Lipase: 28 U/L (ref 11–59)

## 2013-02-12 MED ORDER — ONDANSETRON HCL 4 MG PO TABS: 4.0000 mg | ORAL_TABLET | Freq: Four times a day (QID) | ORAL | Status: DC | PRN

## 2013-02-12 MED ORDER — METOCLOPRAMIDE HCL 5 MG/ML IJ SOLN
20.0000 mg | Freq: Once | INTRAVENOUS | Status: AC
  Administered 2013-02-12: 20 mg via INTRAVENOUS
  Filled 2013-02-12: qty 4

## 2013-02-12 MED ORDER — DIPHENHYDRAMINE HCL 50 MG/ML IJ SOLN
25.0000 mg | Freq: Once | INTRAMUSCULAR | Status: AC
  Administered 2013-02-12: 25 mg via INTRAVENOUS
  Filled 2013-02-12: qty 1

## 2013-02-12 MED ORDER — ACETAMINOPHEN 325 MG PO TABS
650.0000 mg | ORAL_TABLET | Freq: Four times a day (QID) | ORAL | Status: DC | PRN
  Administered 2013-02-12: 650 mg via ORAL
  Filled 2013-02-12: qty 2

## 2013-02-12 MED ORDER — SODIUM CHLORIDE 0.9 % IV BOLUS (SEPSIS)
250.0000 mL | Freq: Once | INTRAVENOUS | Status: AC
  Administered 2013-02-12: 250 mL via INTRAVENOUS

## 2013-02-12 MED ORDER — CARVEDILOL 6.25 MG PO TABS
6.2500 mg | ORAL_TABLET | Freq: Two times a day (BID) | ORAL | Status: DC
  Administered 2013-02-12 – 2013-02-14 (×4): 6.25 mg via ORAL
  Filled 2013-02-12 (×6): qty 1

## 2013-02-12 MED ORDER — ENSURE COMPLETE PO LIQD
237.0000 mL | Freq: Two times a day (BID) | ORAL | Status: DC
  Administered 2013-02-13 – 2013-02-14 (×2): 237 mL via ORAL

## 2013-02-12 MED ORDER — DIGOXIN 250 MCG PO TABS
0.2500 mg | ORAL_TABLET | Freq: Every day | ORAL | Status: DC
  Administered 2013-02-12 – 2013-02-14 (×3): 0.25 mg via ORAL
  Filled 2013-02-12 (×3): qty 1

## 2013-02-12 MED ORDER — METOCLOPRAMIDE HCL 5 MG/ML IJ SOLN: 20.0000 mg | Freq: Once | INTRAVENOUS | Status: DC

## 2013-02-12 MED ORDER — ONDANSETRON HCL 4 MG/2ML IJ SOLN: 4.0000 mg | Freq: Four times a day (QID) | INTRAMUSCULAR | Status: DC | PRN

## 2013-02-12 MED ORDER — HEPARIN SODIUM (PORCINE) 5000 UNIT/ML IJ SOLN
5000.0000 [IU] | Freq: Three times a day (TID) | INTRAMUSCULAR | Status: DC
  Administered 2013-02-12 – 2013-02-14 (×5): 5000 [IU] via SUBCUTANEOUS
  Filled 2013-02-12 (×8): qty 1

## 2013-02-12 MED ORDER — SODIUM CHLORIDE 0.9 % IV SOLN: INTRAVENOUS | Status: AC

## 2013-02-12 MED ORDER — ACETAMINOPHEN 650 MG RE SUPP: 650.0000 mg | Freq: Four times a day (QID) | RECTAL | Status: DC | PRN

## 2013-02-12 NOTE — ED Notes (Signed)
Pt describes headache with sensitivity to light and sound as well as nausea and vomiting.  Pt states that this began yesterday.  Pt is diaphoretic and obviously uncomfortable on arrival, pt states that he had high BP at home and notes that he was recently started on a new BP med, he doesn't know the name of.  Pt denies any other symptoms.

## 2013-02-12 NOTE — H&P (Signed)
Date: 02/12/2013               Patient Name:  [name] MRN: [id number]  DOB: 12-01-1965 Age / Sex: 47 y.o., male   PCP: [name], MD         Medical Service: Internal Medicine Teaching Service         Attending Physician: Dr. Rocco Serene, MD    First Contact: Dr. Delane Ginger Pager: [phone]  Second Contact: Dr. Dorise Hiss Pager: [phone]       After Hours (After 5p/  First Contact Pager: [phone]  weekends / holidays): Second Contact Pager: [phone]   Chief Complaint: Nausea/Vomiting and Headache  History of Present Illness: Pt is a 47 yo AAM with a PMH of HIV (last CD4: 380/Viral Load un-dectable), NICM EF of 20-25% (echo 7/13), HTN, HepB&C, previous alcohol abuse, and GERD. He presents to the ED with complaints of a headache that he has been experiencing every since last admission (01/19/13) when he was put on spironolactone. He reports having chronic diarrhea from one of his HIV meds also. Additionally, he reports having 4 episodes of emesis since yesterday and not eating since yesterday. He reports has been staying hydrated and drinking liquids. He denies any CP, SOB, or abdominal pain. He denies smoking, current alcohol use, or any other recreational drug use.   Of note, he was recently admitted 01/19/13- 01/22/13 for acute on chronic systolic HF. He was diuresed with IV lasix. and discharged on coreg 6.25 mg BID, digoxin 0.25 mg daily, lisinopril 40 mg daily, and spironolactone 25 mg daily.  He reports seeing Dr. [name] on 9/8 and telling him about these headaches.   Meds: Current Facility-Administered Medications  Medication Dose Route Frequency Provider Last Rate Last Dose  . 0.9 %  sodium chloride infusion   Intravenous Continuous Judie Bonus, MD      . acetaminophen (TYLENOL) tablet 650 mg  650 mg Oral Q6H PRN Judie Bonus, MD       Or  . acetaminophen (TYLENOL) suppository 650 mg  650 mg Rectal Q6H PRN Judie Bonus, MD      . carvedilol (COREG)  tablet 6.25 mg  6.25 mg Oral BID WC Judie Bonus, MD      . digoxin Margit Banda) tablet 0.25 mg  0.25 mg Oral Daily Judie Bonus, MD      . Melene Muller ON 02/13/2013] feeding supplement (ENSURE COMPLETE) liquid 237 mL  237 mL Oral BID BM Judie Bonus, MD      . heparin injection 5,000 Units  5,000 Units Subcutaneous Q8H Judie Bonus, MD      . ondansetron [facility]) tablet 4 mg  4 mg Oral Q6H PRN Judie Bonus, MD       Or  . ondansetron [facility]) injection 4 mg  4 mg Intravenous Q6H PRN Judie Bonus, MD        Allergies: Allergies as of 02/12/2013 - Review Complete 02/12/2013  Allergen Reaction Noted  . Bactrim Rash 11/13/2010   Past Medical History  Diagnosis Date  . Hypertension   . HIV infection   . Hepatitis     Hep B and Hep C (patient does not report this but these are listed in previous notes.)  . G6PD deficiency   . Chronic systolic heart failure     a. Echo 12/05/11:  EF 20-25%, diff HK with mild sparing of IL wall, mild AI, mod MR, mild LAE, mild RVE,  mild reduced RVF.;  b.  Echo 05/11/2012:  Mild LVH, EF 20-25%, Gr 1 diast dysfn, mod MR, mild LAE  . NICM (nonischemic cardiomyopathy)     cardia CTA 8/13 negative for obstructive CAD  . CHF (congestive heart failure)   . GERD (gastroesophageal reflux disease)    Past Surgical History  Procedure Laterality Date  . Finger surgery      Thumb laceration.    . Esophagogastroduodenoscopy  12/07/2011    Procedure: ESOPHAGOGASTRODUODENOSCOPY (EGD);  Surgeon: [name], MD,FACG;  Location: [facility] ENDOSCOPY;  Service: Endoscopy;  Laterality: N/A;   Family History  Problem Relation Age of Onset  . Cancer Mother    History   Social History  . Marital Status: Single    Spouse Name: N/A    Number of Children: N/A  . Years of Education: N/A   Occupational History  . Unemployed    Social History Main Topics  . Smoking status: Never Smoker   . Smokeless tobacco: Never Used  . Alcohol Use: 0.6 oz/week      1 Cans of beer per week     Comment: cutting back - knows he cannot. "drink one to get rid of the urge"  . Drug Use: No     Comment: marijuana daily  . Sexual Activity: Yes    Partners: Male     Comment: 28 year relationship, declined condoms   Other Topics Concern  . Not on file   Social History Narrative   Lives alone.  Drinks beer daily.  Liquor rarely.  He occasionally smokes marijuana..    Review of Systems: Pertinent items are noted in HPI.  Physical Exam: Blood pressure 105/73, pulse 82, temperature 98.2 F (36.8 C), temperature source Oral, resp. rate 16, height 5\' 4"  (1.626 m), weight 62.7 kg (138 lb 3.7 oz), SpO2 98.00%.  Constitutional: Vital signs reviewed.  Patient is a chronically ill appearing in no acute distress and cooperative with exam.  Head: Normocephalic and atraumatic. Eyes: PERRL, EOMI, conjunctivae normal, No scleral icterus.  Neck: Supple, Trachea midline .  Cardiovascular: RRR, S1 normal, S2 normal, no MRG, pulses symmetric and intact bilaterally Pulmonary/Chest: normal respiratory effort, CTAB, no wheezes, rales, or rhonchi Abdominal: Soft. Non-tender, non-distended, bowel sounds are normal, no masses, organomegaly, or guarding present.  Musculoskeletal: No joint deformities, erythema, or stiffness Neurological: A&O x3, cranial nerve II-XII are grossly intact, no focal motor deficit noted. Skin: Warm, dry and intact. No rash, cyanosis, or clubbing.  Psychiatric: Normal mood and affect.    Lab results: Basic Metabolic Panel:  Recent Labs  91/47/82 1235  NA 138  K 4.6  CL 103  CO2 20  GLUCOSE 111*  BUN 41*  CREATININE 1.92*  CALCIUM 10.2   Liver Function Tests:  Recent Labs  02/12/13 1235  AST 45*  ALT 47  ALKPHOS 77  BILITOT 0.5  PROT 8.6*  ALBUMIN 4.3    Recent Labs  02/12/13 1235  LIPASE 28   CBC:  Recent Labs  02/12/13 1235  WBC 11.0*  NEUTROABS 7.9*  HGB 16.6  HCT 48.6  MCV 85.0  PLT 199     Coagulation:  Recent Labs  02/12/13 1235  LABPROT 12.6  INR 0.96    Urinalysis:  Recent Labs  02/12/13 1406  COLORURINE AMBER*  LABSPEC 1.026  PHURINE 5.0  GLUCOSEU NEGATIVE  HGBUR NEGATIVE  BILIRUBINUR SMALL*  KETONESUR NEGATIVE  PROTEINUR NEGATIVE  UROBILINOGEN 0.2  NITRITE NEGATIVE  LEUKOCYTESUR NEGATIVE   Imaging  results:  Dg Chest 2 View  02/12/2013   CLINICAL DATA:  Cough  EXAM: CHEST  2 VIEW  COMPARISON:  01/19/2013  FINDINGS: The heart size and mediastinal contours are within normal limits. Both lungs are clear. The visualized skeletal structures are unremarkable. Mild elevation of the right hemidiaphragm.  IMPRESSION: No active cardiopulmonary disease.   Electronically Signed   By: Natasha Mead   On: 02/12/2013 13:40   Ct Head Wo Contrast  02/12/2013   *RADIOLOGY REPORT*  Clinical Data: Headache, photophobia, nausea, vomiting, HIV  CT HEAD WITHOUT CONTRAST  Technique:  Contiguous axial images were obtained from the base of the skull through the vertex without contrast.  Comparison: 06/10/2010  Findings: Mild diffuse cortical atrophy.  No hemorrhage, infarct, mass, hydrocephalus, or extra-axial fluid.  Small mucous retention cyst right maxillary sinus.  IMPRESSION: No acute intracranial abnormality.   Original Report Authenticated By: Esperanza Heir, M.D.    Assessment & Plan by Problem:  Pt is a 47 yo AAM with a PMH of HIV (last CD4: 380/Viral Load un-dectable), NICM EF of 20-25% (echo 7/13), HTN, HepB&C, previous alcohol abuse, and GERD. He presents to the ED with complaints of nausea and vomiting that began two days ago.  Principal Problem:   Acute kidney injury-pt current creatinine is 1.92 which appears to be up from his baseline which appears to be around 1.11. It is likely that his acute injury is multifactorial and possible due to volume depletion in the setting of continued diarrhea, over-diuresis, NSAID use, and possible tenofovir toxicity.   -Urine urea  nitrogen/urine creatinine ratio -Renal US  -held lasix and spironolactone in the setting of AKI -1L NS over 10 hours -BMP in am  Active Problems:   HYPERTENSION-Stable. 105/73.  -held lisinopril, lasix, spironolactone -continue carvedilol, digoxin.   HIV (human immunodeficiency virus infection)-Stable. last CD4: 380 / Viral load un-dectable -holding all HIV meds in the setting of AKI possible tenofovir renal toxicity and possible developing resistance -consult ID about tenofovir    Chronic systolic heart failure-Stable.  -continue carvedilol, digoxin.   Headache-Appears chronic. -will continue to assess.  -tylenol PRN; No NSAIDS in the setting of AKI.   Nausea & vomiting- -Zofran PRN   Dispo: Disposition is deferred at this time, awaiting improvement of current medical problems. Anticipated discharge in approximately 1-2 day(s).   The patient does have a current PCP [name], MD) and does need an [facility] hospital follow-up appointment after discharge.   Signed: Boykin Peek, MD 02/12/2013, 6:39 PM

## 2013-02-12 NOTE — ED Notes (Signed)
Pt is unable to give urine specimen at this time. The pt has been advised to call the tech for assistance to the restroom. The tech has reported to the RN in charge. 

## 2013-02-12 NOTE — ED Provider Notes (Signed)
CSN: [id number]     Arrival date & time 02/12/13  1109 History   First MD Initiated Contact with Patient 02/12/13 1152     Chief Complaint  Patient presents with  . Headache   (Consider location/radiation/quality/duration/timing/severity/associated sxs/prior Treatment) HPI This patient with multiple medical problems presents with new headache, no nausea, no vomiting. Symptoms began approximately one week ago, initially with nausea and diarrhea.  Over the past 2 or 3 days he has developed vomiting and headache.  The headache is anterior, throbbing. There is no relief with anything. There is no concurrent confusion, visual loss, disequilibrium, falling. Patient states that he recently started a new blood pressure medication, but is unsure of the medication. He has multiple other medical problems, including HIV, heart failure.  Past Medical History  Diagnosis Date  . Hypertension   . HIV infection   . Hepatitis     Hep B and Hep C (patient does not report this but these are listed in previous notes.)  . G6PD deficiency   . Chronic systolic heart failure     a. Echo 12/05/11:  EF 20-25%, diff HK with mild sparing of IL wall, mild AI, mod MR, mild LAE, mild RVE, mild reduced RVF.;  b.  Echo 05/11/2012:  Mild LVH, EF 20-25%, Gr 1 diast dysfn, mod MR, mild LAE  . NICM (nonischemic cardiomyopathy)     cardia CTA 8/13 negative for obstructive CAD  . CHF (congestive heart failure)   . GERD (gastroesophageal reflux disease)    Past Surgical History  Procedure Laterality Date  . Finger surgery      Thumb laceration.    . Esophagogastroduodenoscopy  12/07/2011    Procedure: ESOPHAGOGASTRODUODENOSCOPY (EGD);  Surgeon: [name], MD,FACG;  Location: [facility] ENDOSCOPY;  Service: Endoscopy;  Laterality: N/A;   Family History  Problem Relation Age of Onset  . Cancer Mother    History  Substance Use Topics  . Smoking status: Never Smoker   . Smokeless tobacco: Never Used  . Alcohol Use: 0.6  oz/week    1 Cans of beer per week     Comment: cutting back - knows he cannot. "drink one to get rid of the urge"    Review of Systems  Constitutional:       Per HPI, otherwise negative  HENT:       Per HPI, otherwise negative  Respiratory:       Per HPI, otherwise negative  Cardiovascular:       Per HPI, otherwise negative  Gastrointestinal: Positive for nausea, vomiting and diarrhea. Negative for abdominal pain and blood in stool.  Endocrine:       Negative aside from HPI  Genitourinary:       Neg aside from HPI   Musculoskeletal:       Per HPI, otherwise negative  Skin: Negative.   Neurological: Positive for dizziness, weakness and headaches. Negative for tremors, seizures, syncope, facial asymmetry, speech difficulty, light-headedness and numbness.    Allergies  Bactrim  Home Medications   Current Outpatient Rx  Name  Route  Sig  Dispense  Refill  . bisacodyl (DULCOLAX) 5 MG EC tablet   Oral   Take 1 tablet (5 mg total) by mouth daily as needed.   30 tablet   0   . carvedilol (COREG) 6.25 MG tablet   Oral   Take 1 tablet (6.25 mg total) by mouth 2 (two) times daily with a meal.   60 tablet   6  Please file d/t medication dose change. Patient do ...   . Darunavir Ethanolate (PREZISTA) 800 MG tablet   Oral   Take 1 tablet (800 mg total) by mouth daily with breakfast.   30 tablet   6   . digoxin (LANOXIN) 0.25 MG tablet   Oral   Take 1 tablet (0.25 mg total) by mouth daily.   30 tablet   2   . emtricitabine-tenofovir (TRUVADA) 200-300 MG per tablet   Oral   Take 1 tablet by mouth daily.   30 tablet   11   . etravirine (INTELENCE) 100 MG tablet   Oral   Take 2 tablets (200 mg total) by mouth 2 (two) times daily with a meal.   120 tablet   6   . feeding supplement (ENSURE COMPLETE) LIQD   Oral   Take 237 mLs by mouth 2 (two) times daily between meals.   60 Bottle   2     Dispense as written.   . furosemide (LASIX) 20 MG tablet   Oral    Take 20 mg by mouth 2 (two) times daily.         Marland Kitchen ibuprofen (ADVIL,MOTRIN) 200 MG tablet   Oral   Take 400 mg by mouth every 6 (six) hours as needed for pain.         Marland Kitchen lisinopril (PRINIVIL,ZESTRIL) 40 MG tablet   Oral   Take 1 tablet (40 mg total) by mouth daily.   30 tablet   11   . omeprazole (PRILOSEC) 20 MG capsule   Oral   Take 1 capsule (20 mg total) by mouth daily.   30 capsule   11   . ritonavir (NORVIR) 100 MG TABS tablet   Oral   Take 1 tablet (100 mg total) by mouth daily with breakfast.   30 tablet   11   . spironolactone (ALDACTONE) 25 MG tablet   Oral   Take 1 tablet (25 mg total) by mouth daily.   30 tablet   2   . valACYclovir (VALTREX) 1000 MG tablet   Oral   Take 1 tablet (1,000 mg total) by mouth 2 (two) times daily. Take for 5 days for an outbreak   30 tablet   3    BP 113/74  Pulse 57  Temp(Src) 97.7 F (36.5 C) (Oral)  Resp 12  SpO2 100% Physical Exam  Constitutional: He is oriented to person, place, and time. He appears cachectic. He has a sickly appearance. He appears ill.  HENT:  Head: Normocephalic and atraumatic.  No gross asymmetry, or deformity, though his eyes are sunken  Eyes: No scleral icterus.  Neck: Neck supple.  Cardiovascular: Normal rate and regular rhythm.   No murmur heard. Pulmonary/Chest: Effort normal and breath sounds normal. He has no wheezes. He has no rales.  Abdominal: Soft. He exhibits no distension and no mass. There is no tenderness. There is no rebound and no guarding.  Musculoskeletal: He exhibits no edema.  Lymphadenopathy:    He has no cervical adenopathy.  Neurological: He is alert and oriented to person, place, and time. He displays atrophy. He displays no tremor. He exhibits abnormal muscle tone. He displays no seizure activity. Coordination normal.  Skin: Skin is warm and dry.    ED Course  Procedures (including critical care time) Labs Review Labs Reviewed  CBC WITH DIFFERENTIAL   COMPREHENSIVE METABOLIC PANEL  LIPASE, BLOOD  URINALYSIS, ROUTINE W REFLEX MICROSCOPIC  PROTIME-INR   Imaging  Review No results found. Pulse oximetry 97% room air normal Cardiac monitor is a 71, sinus rhythm, unremarkable After the initial evaluation of the patient's chart, including recent evaluation, and his history of HIV, heart failure  Update: Patient's labs notable for acute kidney injury.   MDM  No diagnosis found. This patient with multiple medical problems, including HIV, heart failure now presents with one week of nausea, vomiting, diarrhea, headache.  Patient is neurologically intact, afebrile.  However, given his history of disease, broad differential is considered.  Labs are most notable for demonstration of acute kidney injury.  With the patient's history of heart failure, resuscitation must be conducted diligently, the patient received initial IV fluids in the emergency department.  Absent evidence of pneumonia, meningitis, initial antibiotics are not indicated.  However, the patient required admission for further evaluation and management.    Gerhard Munch, MD 02/12/13 1540

## 2013-02-12 NOTE — ED Notes (Signed)
Called report to [place], RN unit 5W.

## 2013-02-12 NOTE — Progress Notes (Signed)
Pt admitted to [address] [place] from [facility] ED. Pt is alert and oriented to staff, call bell, and room. Bed in lowest position. Call bell within reach. Full assessment to Epic. Rounding M.D. Team paged/notified of patient's admission to floor. Will continue to monitor. Fayne Norrie, RN

## 2013-02-12 NOTE — Progress Notes (Signed)
Patient ID: [name], male   DOB: 1965-07-27, 47 y.o.   MRN: [id number]  Weight Range   Baseline proBNP    Referring Physician: Dr. Jens Som Primary Physician: [name], MD  Primary Cardiologist: GT    HPI: [name] is a 47 y.o. male with a history of HIV, HCV, ETOH abuse and CHF due to NICM with EF 20-25%. He also has a h/o syncope and previously had an EP study which was non-inducible for VT. ICD was not placed.   Was recently admitted 01/19/13- 01/22/13 for acute on chronic systolic HF. ECHO EF 20-25%. He was diuresed with IV lasix. He was discharged on coreg 6.25 mg BID, digoxin 0.25 mg daily, lisinopril 40 mg daily, and spironolactone 25 mg daily. Discharge weight 150 lbs.   Post hospital follow up: Feeling good since he is home. Reports having headaches since starting spironolactone and digoxin. Denies SOB, orthopnea, edema, or CP. Reports when he lies down he sometimes feels his heart jumping, which will last about 10-15 minutes. Not drinking ETOH any more (using non-alcoholic beer). Can walk up stairs with no SOB and go to the grocery store.   ROS: All systems negative except as listed in HPI, PMH and Problem List.  Past Medical History  Diagnosis Date  . Hypertension   . HIV infection   . Hepatitis     Hep B and Hep C (patient does not report this but these are listed in previous notes.)  . G6PD deficiency   . Chronic systolic heart failure     a. Echo 12/05/11:  EF 20-25%, diff HK with mild sparing of IL wall, mild AI, mod MR, mild LAE, mild RVE, mild reduced RVF.;  b.  Echo 05/11/2012:  Mild LVH, EF 20-25%, Gr 1 diast dysfn, mod MR, mild LAE  . NICM (nonischemic cardiomyopathy)     cardia CTA 8/13 negative for obstructive CAD  . CHF (congestive heart failure)   . GERD (gastroesophageal reflux disease)     Current Outpatient Prescriptions  Medication Sig Dispense Refill  . bisacodyl (DULCOLAX) 5 MG EC tablet Take 1 tablet (5 mg total) by mouth daily as  needed.  30 tablet  0  . carvedilol (COREG) 6.25 MG tablet Take 1 tablet (6.25 mg total) by mouth 2 (two) times daily with a meal.  60 tablet  6  . Darunavir Ethanolate (PREZISTA) 800 MG tablet Take 1 tablet (800 mg total) by mouth daily with breakfast.  30 tablet  6  . digoxin (LANOXIN) 0.25 MG tablet Take 1 tablet (0.25 mg total) by mouth daily.  30 tablet  2  . etravirine (INTELENCE) 100 MG tablet Take 2 tablets (200 mg total) by mouth 2 (two) times daily with a meal.  120 tablet  6  . feeding supplement (ENSURE COMPLETE) LIQD Take 237 mLs by mouth 2 (two) times daily between meals.  60 Bottle  2  . furosemide (LASIX) 20 MG tablet Take 20 mg by mouth 2 (two) times daily.      Marland Kitchen ibuprofen (ADVIL,MOTRIN) 200 MG tablet Take 400 mg by mouth every 6 (six) hours as needed for pain.      Marland Kitchen lisinopril (PRINIVIL,ZESTRIL) 40 MG tablet Take 1 tablet (40 mg total) by mouth daily.  30 tablet  11  . omeprazole (PRILOSEC) 20 MG capsule Take 1 capsule (20 mg total) by mouth daily.  30 capsule  11  . spironolactone (ALDACTONE) 25 MG tablet Take 1 tablet (25 mg total) by  mouth daily.  30 tablet  2  . valACYclovir (VALTREX) 1000 MG tablet Take 1 tablet (1,000 mg total) by mouth 2 (two) times daily. Take for 5 days for an outbreak  30 tablet  3  . emtricitabine-tenofovir (TRUVADA) 200-300 MG per tablet Take 1 tablet by mouth daily.  30 tablet  11  . ritonavir (NORVIR) 100 MG TABS tablet Take 1 tablet (100 mg total) by mouth daily with breakfast.  30 tablet  11   No current facility-administered medications for this encounter.    Filed Vitals:   02/06/13 1521  BP: 108/86  Pulse: 70  Weight: 151 lb 6.4 oz (68.675 kg)  SpO2: 99%   Physical Exam:  General: Well appearing, NAD.  HEENT: normal. + multiple earrings/studs  Neck: supple. JVD flat. Carotids 2+ bilat; no bruits. No lymphadenopathy or thryomegaly appreciated.  Cor: PMI laterally displaced. RRR +s3 2/6 MR  Lungs: CTA Abdomen: soft, nontender,  mildly distended. No hepatosplenomegaly. No bruits or masses. Good bowel sounds.  Extremities: no cyanosis, clubbing, rash,  edema  Neuro: alert & oriented x 3, cranial nerves grossly intact. moves all 4 extremities w/o difficulty. Affect pleasant.  ASSESSMENT & PLAN:  1) Chronic systolic HF, NICM likely r/t ETOH abuse. EF 20-25% diffuse HK, LV diastolic dysfunction. Mild AI, moderate MR, RV mildly dilated and systolic fx mildly reduced, mild/mod TR - NYHA II symptoms, volume status good. - Will increase carvedilol to 6.25 mg BID. - Continue digoxin, spironolactone, and lisinopril at current doses - Complaints of heart jumping out of chest at night, likely r/t PVCs. Discussed with patient that if continues can place holter monitor. - Lengthy discussion with patient about risk of SCD d/t low EF, not interested in wearing LifeVest currently. Is interested in referral for ICD once off ETOH x 3 months and if echo shows persistent LV dysfunction - Reinforced the need and importance of daily weights, a low sodium diet, and fluid restriction (less than 2 L a day). Instructed to call the HF clinic if weight increases more than 3 lbs overnight or 5 lbs in a week.  - Will get BMET today.  2) ETOH abuse - Patient is no longer drinking, congratulated patient for abstinence.  Will follow up 2-3 weeks.   Analiz Tvedt,MD 12:13 PM

## 2013-02-13 DIAGNOSIS — N179 Acute kidney failure, unspecified: Principal | ICD-10-CM

## 2013-02-13 DIAGNOSIS — B20 Human immunodeficiency virus [HIV] disease: Secondary | ICD-10-CM

## 2013-02-13 DIAGNOSIS — E43 Unspecified severe protein-calorie malnutrition: Secondary | ICD-10-CM | POA: Insufficient documentation

## 2013-02-13 LAB — BASIC METABOLIC PANEL
CO2: 19 mEq/L (ref 19–32)
Chloride: 100 mEq/L (ref 96–112)
Sodium: 134 mEq/L — ABNORMAL LOW (ref 135–145)

## 2013-02-13 MED ORDER — SODIUM CHLORIDE 0.9 % IV SOLN
INTRAVENOUS | Status: AC
  Administered 2013-02-13: 18:00:00 via INTRAVENOUS

## 2013-02-13 MED ORDER — ZOLPIDEM TARTRATE 5 MG PO TABS
5.0000 mg | ORAL_TABLET | Freq: Every evening | ORAL | Status: DC | PRN
  Administered 2013-02-13: 5 mg via ORAL
  Filled 2013-02-13: qty 1

## 2013-02-13 NOTE — Consult Note (Signed)
INFECTIOUS DISEASE CONSULT NOTE  Date of Admission:  02/12/2013  Date of Consult:  02/13/2013  Reason for Consult: HIV positive, heart failure, renal failure Referring Physician: Josem Kaufmann  Impression/Recommendation HIV positive heart failure  Acute renal failure ETHO abuse (currently in remission)  Would: Hold Anti-retroviral Therapy. Consider holding spironolactone Check [id number]  Comment: Is no clear whether his worsening renal failure is due to dehydration, diarrhea or a side effect of his antiretroviral therapy. Certainly he could become dehydrated predisposing him to renal complications from his tenofovir. Prior to changing his antiretroviral therapy we'll need to know his viral resistance patterns. He is a somewhat comminuted regimen now which would indicate that he does have some background resistance. He states he is "allergic" to efavirenz, genotyope 2012 (M184V, P225H). His history also states that he has a he is resistant to Issentress. I cannot find the results of this testing. HLA testing will give [place] information as to whether or not he would be at risk for an Abacavir hypersensitivity reaction.  HIV 1 RNA Quant (copies/mL)  Date Value  12/06/2012 <20   07/27/2012 <20   03/23/2012 <20      CD4 T Cell Abs (cmm)  Date Value  12/06/2012 380*  07/27/2012 520   03/23/2012 530      Thank you so much for this interesting consult,   [name] (pager) [phone] [email or web address]  [name] is an 47 y.o. male.  HPI: 47 yo M who is HIV positive, admitted to the hospital multiple times in the last year with class IV congestive heart failure. He was recently seen in followup for this and was started on spironolactone. He also has carvedilol changed to 6.25 mg twice a day. Over last 3 days he has developed worsening headaches. These have been frontal in nature, he took over-the-counter pain medications with no relief. He's had no fever or chills. No  photophobia. In the emergency room he was found to be afebrile, normal blood pressure. His creatinine was 1.92 (previously 0.87 on 02/06/2013). This has increased to 2.15 today.  Past Medical History  Diagnosis Date  . Hypertension   . HIV infection   . Hepatitis     Hep B and Hep C (patient does not report this but these are listed in previous notes.)  . G6PD deficiency   . Chronic systolic heart failure     a. Echo 12/05/11:  EF 20-25%, diff HK with mild sparing of IL wall, mild AI, mod MR, mild LAE, mild RVE, mild reduced RVF.;  b.  Echo 05/11/2012:  Mild LVH, EF 20-25%, Gr 1 diast dysfn, mod MR, mild LAE  . NICM (nonischemic cardiomyopathy)     cardia CTA 8/13 negative for obstructive CAD  . CHF (congestive heart failure)   . GERD (gastroesophageal reflux disease)     Past Surgical History  Procedure Laterality Date  . Finger surgery      Thumb laceration.    . Esophagogastroduodenoscopy  12/07/2011    Procedure: ESOPHAGOGASTRODUODENOSCOPY (EGD);  Surgeon: [name], MD,FACG;  Location: [facility] ENDOSCOPY;  Service: Endoscopy;  Laterality: N/A;     Allergies  Allergen Reactions  . Bactrim Rash    Medications:  Scheduled: . carvedilol  6.25 mg Oral BID WC  . digoxin  0.25 mg Oral Daily  . feeding supplement  237 mL Oral BID BM  . heparin  5,000 Units Subcutaneous Q8H    Total days of antibiotics: 0  Social History:  reports that he has never smoked. He has never used smokeless tobacco. He reports that he drinks about 0.6 ounces of alcohol per week. He reports that he does not use illicit drugs.  Family History  Problem Relation Age of Onset  . Cancer Mother     General ROS: Positive for diarrhea. He is unable to quantify the number balance he has. He describes his bowel movements as like water giving him perianal burning. Normal urination. No lower extremity edema. Normal appetite. No alcohol use since last admission. He said some occasional neck soreness which  she relates to his sleeping position. No photophobia. No fevers no chills. See history of present illness.  Blood pressure 100/73, pulse 77, temperature 98 F (36.7 C), temperature source Oral, resp. rate 18, height 5\' 4"  (1.626 m), weight 62.7 kg (138 lb 3.7 oz), SpO2 99.00%. General appearance: alert, cooperative and no distress Eyes: negative findings: conjunctivae and sclerae normal and pupils equal, round, reactive to light and accomodation Throat: lips, mucosa, and tongue normal; teeth and gums normal Neck: no adenopathy and supple, symmetrical, trachea midline Lungs: clear to auscultation bilaterally Heart: regular rate and rhythm Abdomen: normal findings: bowel sounds normal and soft, non-tender Extremities: edema none   Results for orders placed during the hospital encounter of 02/12/13 (from the past 48 hour(s))  CBC WITH DIFFERENTIAL     Status: Abnormal   Collection Time    02/12/13 12:35 PM      Result Value Range   WBC 11.0 (*) 4.0 - 10.5 K/uL   RBC 5.72  4.22 - 5.81 MIL/uL   Hemoglobin 16.6  13.0 - 17.0 g/dL   HCT 19.1  47.8 - 29.5 %   MCV 85.0  78.0 - 100.0 fL   MCH 29.0  26.0 - 34.0 pg   MCHC 34.2  30.0 - 36.0 g/dL   RDW 62.1  30.8 - 65.7 %   Platelets 199  150 - 400 K/uL   Neutrophils Relative % 72  43 - 77 %   Neutro Abs 7.9 (*) 1.7 - 7.7 K/uL   Lymphocytes Relative 19  12 - 46 %   Lymphs Abs 2.1  0.7 - 4.0 K/uL   Monocytes Relative 7  3 - 12 %   Monocytes Absolute 0.8  0.1 - 1.0 K/uL   Eosinophils Relative 2  0 - 5 %   Eosinophils Absolute 0.2  0.0 - 0.7 K/uL   Basophils Relative 0  0 - 1 %   Basophils Absolute 0.0  0.0 - 0.1 K/uL  COMPREHENSIVE METABOLIC PANEL     Status: Abnormal   Collection Time    02/12/13 12:35 PM      Result Value Range   Sodium 138  135 - 145 mEq/L   Potassium 4.6  3.5 - 5.1 mEq/L   Chloride 103  96 - 112 mEq/L   CO2 20  19 - 32 mEq/L   Glucose, Bld 111 (*) 70 - 99 mg/dL   BUN 41 (*) 6 - 23 mg/dL   Creatinine, Ser 8.46 (*)  0.50 - 1.35 mg/dL   Calcium 96.2  8.4 - 95.2 mg/dL   Total Protein 8.6 (*) 6.0 - 8.3 g/dL   Albumin 4.3  3.5 - 5.2 g/dL   AST 45 (*) 0 - 37 U/L   ALT 47  0 - 53 U/L   Alkaline Phosphatase 77  39 - 117 U/L   Total Bilirubin 0.5  0.3 - 1.2 mg/dL  GFR calc non Af Amer 39 (*) >90 mL/min   GFR calc Af Amer 46 (*) >90 mL/min   Comment: (NOTE)     The eGFR has been calculated using the CKD EPI equation.     This calculation has not been validated in all clinical situations.     eGFR's persistently <90 mL/min signify possible Chronic Kidney     Disease.  LIPASE, BLOOD     Status: None   Collection Time    02/12/13 12:35 PM      Result Value Range   Lipase 28  11 - 59 U/L  PROTIME-INR     Status: None   Collection Time    02/12/13 12:35 PM      Result Value Range   Prothrombin Time 12.6  11.6 - 15.2 seconds   INR 0.96  0.00 - 1.49  LACTATE DEHYDROGENASE     Status: None   Collection Time    02/12/13 12:35 PM      Result Value Range   LDH 237  94 - 250 U/L  CG4 I-STAT (LACTIC ACID)     Status: None   Collection Time    02/12/13  1:57 PM      Result Value Range   Lactic Acid, Venous 1.13  0.5 - 2.2 mmol/L  URINALYSIS, ROUTINE W REFLEX MICROSCOPIC     Status: Abnormal   Collection Time    02/12/13  2:06 PM      Result Value Range   Color, Urine AMBER (*) YELLOW   Comment: BIOCHEMICALS MAY BE AFFECTED BY COLOR   APPearance CLEAR  CLEAR   Specific Gravity, Urine 1.026  1.005 - 1.030   pH 5.0  5.0 - 8.0   Glucose, UA NEGATIVE  NEGATIVE mg/dL   Hgb urine dipstick NEGATIVE  NEGATIVE   Bilirubin Urine SMALL (*) NEGATIVE   Ketones, ur NEGATIVE  NEGATIVE mg/dL   Protein, ur NEGATIVE  NEGATIVE mg/dL   Urobilinogen, UA 0.2  0.0 - 1.0 mg/dL   Nitrite NEGATIVE  NEGATIVE   Leukocytes, UA NEGATIVE  NEGATIVE   Comment: MICROSCOPIC NOT DONE ON URINES WITH NEGATIVE PROTEIN, BLOOD, LEUKOCYTES, NITRITE, OR GLUCOSE <1000 mg/dL.  CREATININE, URINE, RANDOM     Status: None   Collection Time     02/13/13  3:41 AM      Result Value Range   Creatinine, Urine 762.96     Comment: RESULTS CONFIRMED BY MANUAL DILUTION  BASIC METABOLIC PANEL     Status: Abnormal   Collection Time    02/13/13  8:13 AM      Result Value Range   Sodium 134 (*) 135 - 145 mEq/L   Potassium 4.3  3.5 - 5.1 mEq/L   Chloride 100  96 - 112 mEq/L   CO2 19  19 - 32 mEq/L   Glucose, Bld 96  70 - 99 mg/dL   BUN 48 (*) 6 - 23 mg/dL   Creatinine, Ser 4.54 (*) 0.50 - 1.35 mg/dL   Calcium 9.9  8.4 - 09.8 mg/dL   GFR calc non Af Amer 34 (*) >90 mL/min   GFR calc Af Amer 40 (*) >90 mL/min   Comment: (NOTE)     The eGFR has been calculated using the CKD EPI equation.     This calculation has not been validated in all clinical situations.     eGFR's persistently <90 mL/min signify possible Chronic Kidney     Disease.      Component  Value Date/Time   SDES URINE, CLEAN CATCH 11/30/2011 1223   SPECREQUEST ADDED 11/30/11 1423 11/30/2011 1223   CULT NO GROWTH 11/30/2011 1223   REPTSTATUS 12/01/2011 FINAL 11/30/2011 1223   Dg Chest 2 View  02/12/2013   CLINICAL DATA:  Cough  EXAM: CHEST  2 VIEW  COMPARISON:  01/19/2013  FINDINGS: The heart size and mediastinal contours are within normal limits. Both lungs are clear. The visualized skeletal structures are unremarkable. Mild elevation of the right hemidiaphragm.  IMPRESSION: No active cardiopulmonary disease.   Electronically Signed   By: Natasha Mead   On: 02/12/2013 13:40   Ct Head Wo Contrast  02/12/2013   *RADIOLOGY REPORT*  Clinical Data: Headache, photophobia, nausea, vomiting, HIV  CT HEAD WITHOUT CONTRAST  Technique:  Contiguous axial images were obtained from the base of the skull through the vertex without contrast.  Comparison: 06/10/2010  Findings: Mild diffuse cortical atrophy.  No hemorrhage, infarct, mass, hydrocephalus, or extra-axial fluid.  Small mucous retention cyst right maxillary sinus.  IMPRESSION: No acute intracranial abnormality.   Original Report Authenticated  By: Esperanza Heir, M.D.   US Renal  02/12/2013   CLINICAL DATA:  Acute renal failure. History of hypertension, hepatitis-C and HIV.  EXAM: RENAL/URINARY TRACT ULTRASOUND COMPLETE  COMPARISON:  Abdominal ultrasound 11/30/2011.  FINDINGS: Right Kidney: Normal in echotexture and appearance with preservation of the cortex, no focal lesions, and no hydronephrosis. 10.9 cm in length.  Left Kidney: Normal in echotexture and appearance with preservation of the cortex and no hydronephrosis. 11.5 cm in length. In the lower pole of the left kidney there is a 4.7 x 4.4 x 3.6 cm anechoic lesion with increased through transmission, compatible with a simple cyst.  Bladder:  Appears normal for degree of bladder distention.  IMPRESSION: 1. No evidence of hydronephrosis. 2. 4.7 x 4.4 x 3.6 cm simple cyst in the lower pole of the left kidney. 3. Otherwise, the sonographic appearance of the kidneys is unremarkable.   Electronically Signed   By: Trudie Reed M.D.   On: 02/12/2013 19:03   No results found for this or any previous visit (from the past 240 hour(s)).    02/13/2013, 3:23 PM     LOS: 1 day

## 2013-02-13 NOTE — Progress Notes (Signed)
INITIAL NUTRITION ASSESSMENT  DOCUMENTATION CODES Per approved criteria  -Severe malnutrition in the context of chronic illness   INTERVENTION: 1. Ensure Complete po BID, each supplement provides 350 kcal and 13 grams of protein.     NUTRITION DIAGNOSIS: Inadequate oral intake related to decreased appetite and increased gastric motility as evidenced by weight loss.   Goal: PO intake to meet >/=90% estimated nutrition needs.   Monitor:  PO intake, weight trends, labs   Reason for Assessment: Malnutrition Screening Tool  47 y.o. male  Admitting Dx: Acute kidney injury  ASSESSMENT: Pt admitted with AKI, with N/V and headache. Pt has a hx of HIV, Hep B&C and  etoh abuse. Pt was recently hospitalized with CHF exacerbation. At this admission pt reports that he has not been able to eat and is vomiting.   Weight hx shows weight trending down, 27 lbs (16% body weight) in the past 5 months.   Pt reports that he has diarrhea daily and is unable to eat much with all the pills he has to take. Discussed foods that can help bulk stool.    Pt meets criteria for severe malnutrition in the context of chronic illness 2/2 weight loss of 16% body weight in less then 6 months and meeting <75% estimated energy needs for >/=1 month.   Height: Ht Readings from Last 1 Encounters:  02/12/13 5\' 4"  (1.626 m)    Weight: Wt Readings from Last 1 Encounters:  02/12/13 138 lb 3.7 oz (62.7 kg)    Ideal Body Weight: 130 lbs   % Ideal Body Weight: 106%  Wt Readings from Last 10 Encounters:  02/12/13 138 lb 3.7 oz (62.7 kg)  02/06/13 151 lb 6.4 oz (68.675 kg)  01/26/13 151 lb (68.493 kg)  01/22/13 150 lb 9.6 oz (68.312 kg)  01/19/13 156 lb (70.761 kg)  12/20/12 159 lb (72.122 kg)  10/13/12 164 lb 1.9 oz (74.444 kg)  09/01/12 165 lb 12.8 oz (75.206 kg)  08/23/12 164 lb 12.8 oz (74.753 kg)  08/18/12 166 lb (75.297 kg)    Usual Body Weight: ~165 lbs   % Usual Body Weight: 84%  BMI:  Body  mass index is 23.72 kg/(m^2). WNL   Estimated Nutritional Needs: Kcal: 1650-1900 Protein: 75-90 gm  Fluid: 1.7-1.9 L   Skin: intact   Diet Order: General  EDUCATION NEEDS: -No education needs identified at this time   Intake/Output Summary (Last 24 hours) at 02/13/13 1509 Last data filed at 02/13/13 1300  Gross per 24 hour  Intake    480 ml  Output    300 ml  Net    180 ml    Last BM: 9/14   Labs:   Recent Labs Lab 02/06/13 1551 02/12/13 1235 02/13/13 0813  NA 137 138 134*  K 4.2 4.6 4.3  CL 101 103 100  CO2 23 20 19   BUN 16 41* 48*  CREATININE 0.87 1.92* 2.15*  CALCIUM 10.2 10.2 9.9  GLUCOSE 84 111* 96    CBG (last 3)  No results found for this basename: GLUCAP,  in the last 72 hours  Scheduled Meds: . carvedilol  6.25 mg Oral BID WC  . digoxin  0.25 mg Oral Daily  . feeding supplement  237 mL Oral BID BM  . heparin  5,000 Units Subcutaneous Q8H    Continuous Infusions:   Past Medical History  Diagnosis Date  . Hypertension   . HIV infection   . Hepatitis     Hep  B and Hep C (patient does not report this but these are listed in previous notes.)  . G6PD deficiency   . Chronic systolic heart failure     a. Echo 12/05/11:  EF 20-25%, diff HK with mild sparing of IL wall, mild AI, mod MR, mild LAE, mild RVE, mild reduced RVF.;  b.  Echo 05/11/2012:  Mild LVH, EF 20-25%, Gr 1 diast dysfn, mod MR, mild LAE  . NICM (nonischemic cardiomyopathy)     cardia CTA 8/13 negative for obstructive CAD  . CHF (congestive heart failure)   . GERD (gastroesophageal reflux disease)     Past Surgical History  Procedure Laterality Date  . Finger surgery      Thumb laceration.    . Esophagogastroduodenoscopy  12/07/2011    Procedure: ESOPHAGOGASTRODUODENOSCOPY (EGD);  Surgeon: [name], MD,FACG;  Location: [facility] ENDOSCOPY;  Service: Endoscopy;  Laterality: N/A;    Isabell Jarvis RD, LDN Pager [phone] After Hours pager [phone]

## 2013-02-13 NOTE — H&P (Signed)
I saw and evaluated the patient. I personally confirmed the key portions of Dr. Shiela Mayer history and exam and reviewed pertinent patient test results. The assessment, diagnosis, and plan were reviewed with the housestaff and I agree with the documentation in the resident's note.  Several issues are currently afflicting [name]; 1) Acute renal failure.  Creatinine has jumped from 0.87 to 2.15 in 1 week.  At the same time, his weight has dropped from 151 to 138 (13 pounds) in the same time period.  This would suggest that he may have been excessively diuresed with a resultant pre-renal azotemia.  Although he has received 1 liter since admission, this is unlikely to have made an impact if he were to have pre-renal azotemia.  We are waiting on the results of the FeNa to confirm this suspicion. The differential includes ACEI associated decrease in GFR, especially in the setting of dehydration or tenofovir renal toxicity, again in the setting of dehydration.  2) Diarrhea possibly secondary to HIV medications.  CD4 in July was > 300 suggesting that an opportunistic cause of the diarrhea is unlikely.  ID has evaluated his regimen and this is currently being worked up.  The diarrhea may have also contributed to his presumed pre-renal azotemia.  3) Headaches started when the patient started spironolactone.  Spironolactone has been reported to cause headaches and his symptoms have begun to improve with the discontinuance of the spironolactone.  With the suspicion of excessive diuresis the spironolactone will not be restarted at discharge for both of these reasons.  We will give another liter of NS over 10 hours.  Given the time it is taking to get the FeNa we will also order a urine sodium now, since a very low Na would support our suspicion for pre-renal azotemia.  We will reassess renal function in the AM after the fluid challenge.  I appreciate ID's assistance in figuring this out.

## 2013-02-13 NOTE — Progress Notes (Signed)
Subjective:  Pt reports HA is better this am since holding spironolactone. He still feels nauseated at times but no emesis here. He is c/o chronic diarrhea due to HIV meds.   Objective: Vital signs in last 24 hours: Filed Vitals:   02/12/13 1745 02/12/13 1817 02/12/13 2151 02/13/13 0544  BP: 102/64 105/73 93/62 95/68   Pulse: 81 82 91 84  Temp:  98.2 F (36.8 C) 97.8 F (36.6 C) 98.1 F (36.7 C)  TempSrc:  Oral Oral Oral  Resp: 16  16 16   Height:  5\' 4"  (1.626 m)    Weight:  62.7 kg (138 lb 3.7 oz)    SpO2: 96% 98% 93% 98%   Weight change:   Intake/Output Summary (Last 24 hours) at 02/13/13 1007 Last data filed at 02/13/13 0915  Gross per 24 hour  Intake    240 ml  Output      0 ml  Net    240 ml   Physical Exam: Constitutional: Vital signs reviewed. Patient is a chronically ill appearing in no acute distress and cooperative with exam.  Cardiovascular: RRR, S1 normal, S2 normal, no MRG, pulses symmetric and intact bilaterally  Pulmonary/Chest: normal respiratory effort, CTAB, no wheezes, rales, or rhonchi  Abdominal: Soft. Non-tender, non-distended, bowel sounds are normal, no masses, organomegaly, or guarding present.  Skin: Warm, dry and intact. No rash, cyanosis, or clubbing.     Lab Results: Basic Metabolic Panel:  Recent Labs Lab 02/12/13 1235 02/13/13 0813  NA 138 134*  K 4.6 4.3  CL 103 100  CO2 20 19  GLUCOSE 111* 96  BUN 41* 48*  CREATININE 1.92* 2.15*  CALCIUM 10.2 9.9   Liver Function Tests:  Recent Labs Lab 02/12/13 1235  AST 45*  ALT 47  ALKPHOS 77  BILITOT 0.5  PROT 8.6*  ALBUMIN 4.3    Recent Labs Lab 02/12/13 1235  LIPASE 28   No results found for this basename: AMMONIA,  in the last 168 hours CBC:  Recent Labs Lab 02/12/13 1235  WBC 11.0*  NEUTROABS 7.9*  HGB 16.6  HCT 48.6  MCV 85.0  PLT 199   Cardiac Enzymes: No results found for this basename: CKTOTAL, CKMB, CKMBINDEX, TROPONINI,  in the last 168  hours BNP:  Recent Labs Lab 02/06/13 1551  PROBNP 1620.0*   D-Dimer: No results found for this basename: DDIMER,  in the last 168 hours CBG: No results found for this basename: GLUCAP,  in the last 168 hours Hemoglobin A1C: No results found for this basename: HGBA1C,  in the last 168 hours Fasting Lipid Panel: No results found for this basename: CHOL, HDL, LDLCALC, TRIG, CHOLHDL, LDLDIRECT,  in the last 168 hours Thyroid Function Tests: No results found for this basename: TSH, T4TOTAL, FREET4, T3FREE, THYROIDAB,  in the last 168 hours Coagulation:  Recent Labs Lab 02/12/13 1235  LABPROT 12.6  INR 0.96   Anemia Panel: No results found for this basename: VITAMINB12, FOLATE, FERRITIN, TIBC, IRON, RETICCTPCT,  in the last 168 hours Urine Drug Screen: Drugs of Abuse  No results found for this basename: labopia, cocainscrnur, labbenz, amphetmu, thcu, labbarb    Alcohol Level: No results found for this basename: ETH,  in the last 168 hours Urinalysis:  Micro Results: No results found for this or any previous visit (from the past 240 hour(s)). Studies/Results: Dg Chest 2 View  02/12/2013   CLINICAL DATA:  Cough  EXAM: CHEST  2 VIEW  COMPARISON:  01/19/2013  FINDINGS:  The heart size and mediastinal contours are within normal limits. Both lungs are clear. The visualized skeletal structures are unremarkable. Mild elevation of the right hemidiaphragm.  IMPRESSION: No active cardiopulmonary disease.   Electronically Signed   By: Natasha Mead   On: 02/12/2013 13:40   Ct Head Wo Contrast  02/12/2013   *RADIOLOGY REPORT*  Clinical Data: Headache, photophobia, nausea, vomiting, HIV  CT HEAD WITHOUT CONTRAST  Technique:  Contiguous axial images were obtained from the base of the skull through the vertex without contrast.  Comparison: 06/10/2010  Findings: Mild diffuse cortical atrophy.  No hemorrhage, infarct, mass, hydrocephalus, or extra-axial fluid.  Small mucous retention cyst right  maxillary sinus.  IMPRESSION: No acute intracranial abnormality.   Original Report Authenticated By: Esperanza Heir, M.D.   US Renal  02/12/2013   CLINICAL DATA:  Acute renal failure. History of hypertension, hepatitis-C and HIV.  EXAM: RENAL/URINARY TRACT ULTRASOUND COMPLETE  COMPARISON:  Abdominal ultrasound 11/30/2011.  FINDINGS: Right Kidney: Normal in echotexture and appearance with preservation of the cortex, no focal lesions, and no hydronephrosis. 10.9 cm in length.  Left Kidney: Normal in echotexture and appearance with preservation of the cortex and no hydronephrosis. 11.5 cm in length. In the lower pole of the left kidney there is a 4.7 x 4.4 x 3.6 cm anechoic lesion with increased through transmission, compatible with a simple cyst.  Bladder:  Appears normal for degree of bladder distention.  IMPRESSION: 1. No evidence of hydronephrosis. 2. 4.7 x 4.4 x 3.6 cm simple cyst in the lower pole of the left kidney. 3. Otherwise, the sonographic appearance of the kidneys is unremarkable.   Electronically Signed   By: Trudie Reed M.D.   On: 02/12/2013 19:03   Medications: I have reviewed the patient's current medications. Scheduled Meds: . carvedilol  6.25 mg Oral BID WC  . digoxin  0.25 mg Oral Daily  . feeding supplement  237 mL Oral BID BM  . heparin  5,000 Units Subcutaneous Q8H   Continuous Infusions:  PRN Meds:.acetaminophen, acetaminophen, ondansetron (ZOFRAN) IV, ondansetron Assessment/Plan: Pt is a 47 yo AAM with a PMH of HIV (last CD4: 380/Viral Load un-dectable), NICM EF of 20-25% (echo 7/13), HTN, HepB&C, previous alcohol abuse, and GERD. He presents to the ED with complaints of nausea and vomiting that began two days ago.  Principal Problem:  Acute kidney injury-pt current creatinine is 1.92 which appears to be up from his baseline which appears to be around 1.11. It is likely that his acute injury is multifactorial and possible due to volume depletion in the setting of  continued diarrhea, over-diuresis, NSAID use, and possible tenofovir toxicity. Renal US shows no signs of hydronephrosis. He was given 1L NS over 10 hours with worsening of his creatinine.  -Urine urea nitrogen/urine creatinine ratio  -continuing to hold lasix and spironolactone in the setting of AKI  -1L NS over 10 hours  -BMP in am  Active Problems:  HYPERTENSION-Stable. 105/73.  -held lisinopril, lasix, spironolactone  -continue carvedilol, digoxin.  HIV (human immunodeficiency virus infection)-Stable. last CD4: 380 / Viral load  undetectable. ID consulted. -holding all HIV meds in the setting of AKI possible tenofovir renal toxicity and possible developing resistance  Chronic systolic heart failure-Stable.  -continue carvedilol, digoxin.  Headache-Appears chronic. Improved with holding spironolactone.  -will continue to assess.  -continue to hold spironolactone -tylenol PRN; No NSAIDS in the setting of AKI.  Nausea & vomiting-  -Zofran PRN   Dispo: Disposition is deferred at  this time, awaiting improvement of current medical problems.  Anticipated discharge in approximately 1-2 day(s).   The patient does have a current PCP [name], MD) and does need an [facility] hospital follow-up appointment after discharge.   .Services Needed at time of discharge: Y = Yes, Blank = No PT:   OT:   RN:   Equipment:   Other:     LOS: 1 day   Boykin Peek, MD 02/13/2013, 10:07 AM

## 2013-02-14 DIAGNOSIS — E43 Unspecified severe protein-calorie malnutrition: Secondary | ICD-10-CM

## 2013-02-14 DIAGNOSIS — I5022 Chronic systolic (congestive) heart failure: Secondary | ICD-10-CM

## 2013-02-14 LAB — BASIC METABOLIC PANEL
BUN: 30 mg/dL — ABNORMAL HIGH (ref 6–23)
Chloride: 105 mEq/L (ref 96–112)
GFR calc Af Amer: >90 mL/min (ref >90)
Glucose, Bld: 93 mg/dL (ref 70–99)
Potassium: 4.3 mEq/L (ref 3.5–5.1)

## 2013-02-14 LAB — T-HELPER CELLS (CD4) COUNT (NOT AT ARMC): CD4 % Helper T Cell: 20 % — ABNORMAL LOW (ref 33–55)

## 2013-02-14 LAB — UREA NITROGEN, URINE: Urea Nitrogen, Ur: 425 mg/dL

## 2013-02-14 NOTE — Discharge Summary (Signed)
Name: [name] MRN: [id number] DOB: 1965-12-29 47 y.o. PCP: [name], MD  Date of Admission: 02/12/2013 11:28 AM Date of Discharge: 02/14/2013 Attending Physician: Rocco Serene, MD  Discharge Diagnosis: Principal Problem:   Acute kidney injury Active Problems:   HYPERTENSION   HIV (human immunodeficiency virus infection)   Chronic systolic heart failure   Headache   Nausea & vomiting   Protein-calorie malnutrition, severe  Discharge Medications:   Medication List    STOP taking these medications       bisacodyl 5 MG EC tablet  Commonly known as:  DULCOLAX     Darunavir Ethanolate 800 MG tablet  Commonly known as:  PREZISTA     emtricitabine-tenofovir 200-300 MG per tablet  Commonly known as:  TRUVADA     etravirine 100 MG tablet  Commonly known as:  INTELENCE     ibuprofen 200 MG tablet  Commonly known as:  ADVIL,MOTRIN     ritonavir 100 MG Tabs tablet  Commonly known as:  NORVIR     spironolactone 25 MG tablet  Commonly known as:  ALDACTONE      TAKE these medications       carvedilol 6.25 MG tablet  Commonly known as:  COREG  Take 1 tablet (6.25 mg total) by mouth 2 (two) times daily with a meal.     digoxin 0.25 MG tablet  Commonly known as:  LANOXIN  Take 1 tablet (0.25 mg total) by mouth daily.     feeding supplement Liqd  Take 237 mLs by mouth 2 (two) times daily between meals.     furosemide 20 MG tablet  Commonly known as:  LASIX  Take 20 mg by mouth 2 (two) times daily.     lisinopril 40 MG tablet  Commonly known as:  PRINIVIL,ZESTRIL  Take 1 tablet (40 mg total) by mouth daily.     omeprazole 20 MG capsule  Commonly known as:  PRILOSEC  Take 1 capsule (20 mg total) by mouth daily.     valACYclovir 1000 MG tablet  Commonly known as:  VALTREX  Take 1 tablet (1,000 mg total) by mouth 2 (two) times daily. Take for 5 days for an outbreak        Disposition and follow-up:   [name] was discharged from  [facility] in Good condition.  At the hospital follow up visit please address:  1.  Further episodes of N/V/D; ART regimen (discontinued upon admission)  2.  Labs / imaging needed at time of follow-up: None  3.  Pending labs/ test needing follow-up: None  Follow-up Appointments:     Follow-up Information   Follow up with [name], MD On 02/21/2013. (1:20pm)    Specialty:  Cardiology   Contact information:   [address] [place] [address] [phone]       Follow up with Acey Lav, MD On 02/23/2013. (4:00pm)    Specialty:  Infectious Diseases   Contact information:   [address] [place] [address] [phone]       Discharge Instructions: Discharge Orders   Future Appointments Provider Department Dept Phone   02/21/2013 1:20 PM [facility] [phone]   02/23/2013 4:00 PM Randall Hiss, MD [facility] [phone]   03/06/2013 9:00 AM [facility] [phone]  04/10/2013 10:00 AM [facility] [phone]   04/24/2013 9:45 AM [name], MD [facility] for Infect[facility] Disease [phone]   Future Orders Complete By Expires   (HEART FAILURE PATIENTS) Call MD:  Anytime you have any of the following symptoms: 1) 3 pound weight gain in 24 hours or 5 pounds in 1 week 2) shortness of breath, with or without a dry hacking cough 3) swelling in the hands, feet or stomach 4) if you have to sleep on extra pillows at night in order to breathe.  As directed    Diet - low sodium heart healthy  As directed    Increase activity slowly  As directed       Consultations:    Procedures Performed:  Ct Abdomen Pelvis Wo Contrast  01/19/2013   *RADIOLOGY REPORT*  Clinical Data:  Severe abdominal pain.  CT ABDOMEN AND PELVIS WITHOUT CONTRAST  Technique:  Multidetector CT imaging of the abdomen and pelvis was performed following the standard protocol without intravenous contrast.  Comparison: Single view of the abdomen 12/13/2011.  CT chest, abdomen and pelvis 11/30/2011.  Findings: There is massive cardiomegaly.  No pleural or pericardial effusion.  Lung bases demonstrate some dependent atelectasis.  An unchanged cyst is seen off the lower pole of the left kidney. Small calcification is again identified within the inferior aspect of the cyst.  There is no hydronephrosis on the right or left and no ureteral stones are identified.  The stomach, small and large bowel and appendix appear normal.  The liver, adrenal glands, spleen and pancreas appear normal.  No lymphadenopathy or fluid is identified.  There is no focal bony abnormality.  IMPRESSION:  1.  No acute finding. 2.  Marked cardiomegaly. 3.  No change in a cyst off the lower pole of the left kidney with a small calcification within it.   Original Report Authenticated By: Holley Dexter, M.D.   Dg Chest 2 View  02/12/2013   CLINICAL DATA:  Cough  EXAM: CHEST  2 VIEW  COMPARISON:  01/19/2013  FINDINGS: The heart size and mediastinal contours are within normal limits. Both lungs are clear. The visualized skeletal structures are unremarkable. Mild elevation of the right hemidiaphragm.  IMPRESSION: No active cardiopulmonary disease.   Electronically Signed   By: Natasha Mead   On: 02/12/2013 13:40   Dg Chest 2 View  01/19/2013   *RADIOLOGY REPORT*  Clinical Data: Chest pain, shortness of breath.  CHEST - 2 VIEW  Comparison: December 12, 2011.  Findings: Stable cardiomegaly.  No pleural effusion or pneumothorax is noted.  Stable mild central pulmonary vascular congestion is noted.  Kerley B lines are noted laterally in right lung base which may represent minimal interstitial edema.  No pneumonia or atelectasis is noted.  IMPRESSION: Minimal  Kerley B lines seen laterally in right lung base which may represent minimal pulmonary edema.  Stable cardiomegaly and central pulmonary vascular congestion compared to prior exam.   Original Report Authenticated By: Lupita Raider.,  M.D.   Ct Head Wo Contrast  02/12/2013   *RADIOLOGY REPORT*  Clinical Data: Headache, photophobia, nausea, vomiting, HIV  CT HEAD WITHOUT CONTRAST  Technique:  Contiguous axial images were obtained from the base of the skull through the vertex without contrast.  Comparison: 06/10/2010  Findings: Mild diffuse cortical atrophy.  No hemorrhage, infarct, mass, hydrocephalus, or extra-axial fluid.  Small mucous retention cyst right maxillary sinus.  IMPRESSION: No acute  intracranial abnormality.   Original Report Authenticated By: Esperanza Heir, M.D.   Ct Angio Chest Pe W/cm &/or Wo Cm  01/20/2013   *RADIOLOGY REPORT*  Clinical Data: Shortness of breath.  Left-sided chest pain. Positive D-dimer.  CT ANGIOGRAPHY CHEST  Technique:  Multidetector CT imaging of the chest using the standard protocol during bolus administration of intravenous contrast. Multiplanar reconstructed images including MIPs were obtained and reviewed to evaluate the vascular anatomy.  Contrast: OMNIPAQUE IOHEXOL 350 MG/ML SOLN  Comparison: Chest x-ray dated 01/19/2013 and CT scan dated 11/30/2011  Findings: There are no pulmonary emboli.  There is chronic peribronchial thickening with chronic cardiomegaly.  No effusions. Progressive emphysematous blebs in both lungs.  Small mediastinal lymph nodes are stable.  No osseous abnormality.  Tiny pericardial effusion.  IMPRESSION:  1.  No pulmonary emboli. 2.  Chronic cardiomegaly with a tiny pericardial effusion. 3.  Slight progression of scattered emphysematous blebs. 4.  Chronic bronchitic changes.   Original Report Authenticated By: Francene Boyers, M.D.   US Renal  02/12/2013   CLINICAL DATA:  Acute renal failure. History of hypertension, hepatitis-C and HIV.   EXAM: RENAL/URINARY TRACT ULTRASOUND COMPLETE  COMPARISON:  Abdominal ultrasound 11/30/2011.  FINDINGS: Right Kidney: Normal in echotexture and appearance with preservation of the cortex, no focal lesions, and no hydronephrosis. 10.9 cm in length.  Left Kidney: Normal in echotexture and appearance with preservation of the cortex and no hydronephrosis. 11.5 cm in length. In the lower pole of the left kidney there is a 4.7 x 4.4 x 3.6 cm anechoic lesion with increased through transmission, compatible with a simple cyst.  Bladder:  Appears normal for degree of bladder distention.  IMPRESSION: 1. No evidence of hydronephrosis. 2. 4.7 x 4.4 x 3.6 cm simple cyst in the lower pole of the left kidney. 3. Otherwise, the sonographic appearance of the kidneys is unremarkable.   Electronically Signed   By: Trudie Reed M.D.   On: 02/12/2013 19:03    2D Echo: None  Cardiac Cath: None  Admission HPI: Pt is a 46 yo AAM with a PMH of HIV (last CD4: 380/Viral Load un-dectable), NICM EF of 20-25% (echo 7/13), HTN, HepB&C, previous alcohol abuse, and GERD. He presents to the ED with complaints of a headache that he has been experiencing every since last admission (01/19/13) when he was put on spironolactone. He reports having chronic diarrhea from one of his HIV meds also. Additionally, he reports having 4 episodes of emesis since yesterday and not eating since yesterday. He reports has been staying hydrated and drinking liquids. He denies any CP, SOB, or abdominal pain. He denies smoking, current alcohol use, or any other recreational drug use.  Of note, he was recently admitted 01/19/13- 01/22/13 for acute on chronic systolic HF. He was diuresed with IV lasix. and discharged on coreg 6.25 mg BID, digoxin 0.25 mg daily, lisinopril 40 mg daily, and spironolactone 25 mg daily.  He reports seeing Dr. [name] on 9/8 and telling him about these headaches.    Hospital Course by problem list: Principal Problem:   Acute  kidney injury-Pt is a 47 yo AAM who presents to the ED with headache and some associated nausea and 1-2 episodes of vomiting the prior day. He reports experiencing a headache since being put on spironolactone during a prior admission. However, upon arrival in the ED his creatinine was found to be 1.92 which is up from his baseline. He was given gentle fluid resuscitation over the course  of 2 days and his creatinine trended down upon discharge to 1.00. He reports having significant diarrhea since beginning ART. During his hospitalization, a RD discovered pt had weight loss of approximately 27 lbs in the previous 5 months. This is concerning for significant diarrhea causing volume depletion which contributed to his AKI.  Additionally, we held his ART including tenofovir for the possibility that it may have been contributing to his AKI. ID was consulted and recommended not restarting his ART upon discharge but to follow up with them in about a week. Active Problems:   HYPERTENSION-Pt BP was well controlled during his hospital stay. We held his lasix, lisinopril, and spironolactone in the setting of AKI, but continued his carvedilol and digoxin.    HIV (human immunodeficiency virus infection)-Stable. Last CD4: 620 and viral load undetectable. During admission, we discontinued all of his HIV medications for concern that tenofovir may be contributing to his AKI and did not want to run the risk of perpetuating resistance. Additionally, he reports having significant diarrhea which also raises concern. We consulted ID and they recommended not restarting his ART regimen and ordered  [id number] and genotyping. These tests were pending upon discharge. He was given a follow-up appointment with ID.    Chronic systolic heart failure-Stable. Followed by Dr. [name]. During his hospital stay, we discontinued his spironolactone due to pt reporting he experienced significant headaches since being put on this medication. After  discontinuing he reports resolution of HA. We held his lasix, lisinopril, and spironolactone in the setting of AKI, but continued his carvedilol and digoxin. Upon discharge, we restarted his lasix and lisinopril and did not restart spironolactone.    Headache-Resolved after discontinuing spironolactone.    Nausea & vomiting-Resolved.    Protein-calorie malnutrition, severe-Chronic. He is taking ensure or similar feeding supplement twice daily between meals. During his hospital stay, the RD reported a 27 lbs weight loss in the past 5 months and meets criteria for severe malnutrition in the context of chronic illness of weight loss of 16% body weight in less than 6 months. Most likely multifactorial including significant diarrhea reported by the patient due to his ART regimen.     Discharge Vitals:   BP 93/62  Pulse 61  Temp(Src) 98.2 F (36.8 C) (Oral)  Resp 17  Ht 5\' 4"  (1.626 m)  Wt 62.701 kg (138 lb 3.7 oz)  BMI 23.72 kg/m2  SpO2 99%  Discharge Labs:  Results for orders placed during the hospital encounter of 02/12/13 (from the past 24 hour(s))  SODIUM, URINE, RANDOM     Status: None   Collection Time    02/13/13  7:40 PM      Result Value Range   Sodium, Ur 25    BASIC METABOLIC PANEL     Status: Abnormal   Collection Time    02/14/13  5:37 AM      Result Value Range   Sodium 139  135 - 145 mEq/L   Potassium 4.3  3.5 - 5.1 mEq/L   Chloride 105  96 - 112 mEq/L   CO2 22  19 - 32 mEq/L   Glucose, Bld 93  70 - 99 mg/dL   BUN 30 (*) 6 - 23 mg/dL   Creatinine, Ser 1.61  0.50 - 1.35 mg/dL   Calcium 9.8  8.4 - 09.6 mg/dL   GFR calc non Af Amer 87 (*) >90 mL/min   GFR calc Af Amer >90  >90 mL/min    Signed: Boykin Peek, MD  02/14/2013, 11:06 AM   Time Spent on Discharge: 50 minutes Services Ordered on Discharge: None Equipment Ordered on Discharge: None

## 2013-02-14 NOTE — Progress Notes (Signed)
Came to visit patient on behalf of [facility] Care Management services. He is currently active with [facility] Care Management. Upon visiting patient in room, he was already discharged. Patient will receive post hospital follow up discharge call and will be evaluated for monthly home visits. Raiford Noble, MSN-Ed, RN,BSN- [facility] [phone]

## 2013-02-14 NOTE — Progress Notes (Signed)
Patient discharged to home. Patient AVS reviewed. Patient verbalized understanding of medications and follow-up appointments.  Patient remains stable; no signs or symptoms of distress.  Patient educated to return to the ER in cases of SOB, dizziness, fever, chest pain, or fainting.  

## 2013-02-14 NOTE — Progress Notes (Signed)
Subjective:  Pt reports no HA today-we are continuing to hold spironolactone. He denies any CP, SOB, or N/V. He reports only one small BM this morning.    Objective: Vital signs in last 24 hours: Filed Vitals:   02/13/13 0544 02/13/13 1415 02/13/13 2058 02/14/13 0530  BP: 95/68 100/73 124/73 93/62  Pulse: 84 77 60 61  Temp: 98.1 F (36.7 C) 98 F (36.7 C) 98.7 F (37.1 C) 98.2 F (36.8 C)  TempSrc: Oral Oral Oral Oral  Resp: 16 18 18 17   Height:   5\' 4"  (1.626 m)   Weight:   62.701 kg (138 lb 3.7 oz)   SpO2: 98% 99% 99%    Weight change: 0.001 kg (0.1 oz)  Intake/Output Summary (Last 24 hours) at 02/14/13 1152 Last data filed at 02/14/13 0836  Gross per 24 hour  Intake 1023.33 ml  Output    850 ml  Net 173.33 ml   Physical Exam: Constitutional: Vital signs reviewed. Patient is a chronically ill appearing in no acute distress and cooperative with exam.  Cardiovascular: RRR, S1 normal, S2 normal, no MRG, pulses symmetric and intact bilaterally  Pulmonary/Chest: normal respiratory effort, CTAB, no wheezes, rales, or rhonchi  Abdominal: Soft. Non-tender, non-distended, bowel sounds are normal, no masses, organomegaly, or guarding present.  Skin: Warm, dry and intact. No rash, cyanosis, or clubbing.   Lab Results: Basic Metabolic Panel:  Recent Labs Lab 02/13/13 0813 02/14/13 0537  NA 134* 139  K 4.3 4.3  CL 100 105  CO2 19 22  GLUCOSE 96 93  BUN 48* 30*  CREATININE 2.15* 1.00  CALCIUM 9.9 9.8   Liver Function Tests:  Recent Labs Lab 02/12/13 1235  AST 45*  ALT 47  ALKPHOS 77  BILITOT 0.5  PROT 8.6*  ALBUMIN 4.3    Recent Labs Lab 02/12/13 1235  LIPASE 28   No results found for this basename: AMMONIA,  in the last 168 hours CBC:  Recent Labs Lab 02/12/13 1235  WBC 11.0*  NEUTROABS 7.9*  HGB 16.6  HCT 48.6  MCV 85.0  PLT 199   Cardiac Enzymes: No results found for this basename: CKTOTAL, CKMB, CKMBINDEX, TROPONINI,  in the last 168  hours BNP: No results found for this basename: PROBNP,  in the last 168 hours D-Dimer: No results found for this basename: DDIMER,  in the last 168 hours CBG: No results found for this basename: GLUCAP,  in the last 168 hours Hemoglobin A1C: No results found for this basename: HGBA1C,  in the last 168 hours Fasting Lipid Panel: No results found for this basename: CHOL, HDL, LDLCALC, TRIG, CHOLHDL, LDLDIRECT,  in the last 168 hours Thyroid Function Tests: No results found for this basename: TSH, T4TOTAL, FREET4, T3FREE, THYROIDAB,  in the last 168 hours Coagulation:  Recent Labs Lab 02/12/13 1235  LABPROT 12.6  INR 0.96   Anemia Panel: No results found for this basename: VITAMINB12, FOLATE, FERRITIN, TIBC, IRON, RETICCTPCT,  in the last 168 hours Urine Drug Screen: Drugs of Abuse  No results found for this basename: labopia,  cocainscrnur,  labbenz,  amphetmu,  thcu,  labbarb    Alcohol Level: No results found for this basename: ETH,  in the last 168 hours Urinalysis:  Micro Results: No results found for this or any previous visit (from the past 240 hour(s)). Studies/Results: Dg Chest 2 View  02/12/2013   CLINICAL DATA:  Cough  EXAM: CHEST  2 VIEW  COMPARISON:  01/19/2013  FINDINGS: The heart size and mediastinal contours are within normal limits. Both lungs are clear. The visualized skeletal structures are unremarkable. Mild elevation of the right hemidiaphragm.  IMPRESSION: No active cardiopulmonary disease.   Electronically Signed   By: Natasha Mead   On: 02/12/2013 13:40   Ct Head Wo Contrast  02/12/2013   *RADIOLOGY REPORT*  Clinical Data: Headache, photophobia, nausea, vomiting, HIV  CT HEAD WITHOUT CONTRAST  Technique:  Contiguous axial images were obtained from the base of the skull through the vertex without contrast.  Comparison: 06/10/2010  Findings: Mild diffuse cortical atrophy.  No hemorrhage, infarct, mass, hydrocephalus, or extra-axial fluid.  Small mucous retention  cyst right maxillary sinus.  IMPRESSION: No acute intracranial abnormality.   Original Report Authenticated By: Esperanza Heir, M.D.   US Renal  02/12/2013   CLINICAL DATA:  Acute renal failure. History of hypertension, hepatitis-C and HIV.  EXAM: RENAL/URINARY TRACT ULTRASOUND COMPLETE  COMPARISON:  Abdominal ultrasound 11/30/2011.  FINDINGS: Right Kidney: Normal in echotexture and appearance with preservation of the cortex, no focal lesions, and no hydronephrosis. 10.9 cm in length.  Left Kidney: Normal in echotexture and appearance with preservation of the cortex and no hydronephrosis. 11.5 cm in length. In the lower pole of the left kidney there is a 4.7 x 4.4 x 3.6 cm anechoic lesion with increased through transmission, compatible with a simple cyst.  Bladder:  Appears normal for degree of bladder distention.  IMPRESSION: 1. No evidence of hydronephrosis. 2. 4.7 x 4.4 x 3.6 cm simple cyst in the lower pole of the left kidney. 3. Otherwise, the sonographic appearance of the kidneys is unremarkable.   Electronically Signed   By: Trudie Reed M.D.   On: 02/12/2013 19:03   Medications: I have reviewed the patient's current medications. Scheduled Meds: . carvedilol  6.25 mg Oral BID WC  . digoxin  0.25 mg Oral Daily  . feeding supplement  237 mL Oral BID BM  . heparin  5,000 Units Subcutaneous Q8H   Continuous Infusions:  PRN Meds:.acetaminophen, acetaminophen, ondansetron (ZOFRAN) IV, ondansetron, zolpidem Assessment/Plan: Pt is a 47 yo AAM with a PMH of HIV (last CD4: 380/Viral Load un-dectable), NICM EF of 20-25% (echo 7/13), HTN, HepB&C, previous alcohol abuse, and GERD. He presents to the ED with complaints of nausea and vomiting that began two days ago.  Principal Problem:  Acute kidney injury-pt current creatinine is 1.92 which appears to be up from his baseline which appears to be around 1.11. It is likely that his acute injury is multifactorial and possible due to volume depletion in  the setting of continued diarrhea, over-diuresis, NSAID use, and possible tenofovir toxicity. Renal US shows no signs of hydronephrosis. He was given 1L NS over 10 hours with worsening of his creatinine.  Last night, he was given an additional 1L of NS overnight and his creatinine declined to 1.0. -continuing to hold lasix and spironolactone in the setting of AKI  Active Problems:  HYPERTENSION-Stable. 105/73.  -held lisinopril, lasix, spironolactone  -continue carvedilol, digoxin.  HIV (human immunodeficiency virus infection)-Stable. last CD4: 380 / Viral load  undetectable. ID consulted. -holding all HIV meds in the setting of AKI possible tenofovir renal toxicity and possible developing resistance. ID consulted and is ordering genotype with resistance testing.  Chronic systolic heart failure-Stable.  -continue carvedilol, digoxin.  Headache-Resolved with holding spironolactone.  -continue to hold spironolactone -tylenol PRN; No NSAIDS in the setting of AKI.  Nausea & vomiting-  -Zofran PRN  Dispo:  Anticipated discharge in approximately today.  .Services Needed at time of discharge: Y = Yes, Blank = No PT:   OT:   RN:   Equipment:   Other:     LOS: 2 days   Boykin Peek, MD 02/14/2013, 11:52 AM

## 2013-02-20 LAB — HLA B*5701: HLA B 5701: NEGATIVE

## 2013-02-20 NOTE — Discharge Summary (Signed)
I saw [name] on day of discharge and assisted with the formulated plan.

## 2013-02-21 ENCOUNTER — Ambulatory Visit (HOSPITAL_COMMUNITY)
Admission: RE | Admit: 2013-02-21 | Discharge: 2013-02-21 | Disposition: A | Discharge status: Home / Self Care | Payer: Medicare Other | Source: Ambulatory Visit | Attending: Internal Medicine | Admitting: Internal Medicine

## 2013-02-21 VITALS — BP 112/78 | HR 72 | Wt 156.8 lb

## 2013-02-21 DIAGNOSIS — I5022 Chronic systolic (congestive) heart failure: Secondary | ICD-10-CM | POA: Insufficient documentation

## 2013-02-21 DIAGNOSIS — N179 Acute kidney failure, unspecified: Secondary | ICD-10-CM | POA: Insufficient documentation

## 2013-02-21 DIAGNOSIS — F1010 Alcohol abuse, uncomplicated: Secondary | ICD-10-CM

## 2013-02-21 LAB — BASIC METABOLIC PANEL
CO2: 28 mEq/L (ref 19–32)
Chloride: 100 mEq/L (ref 96–112)
Creatinine, Ser: 0.99 mg/dL (ref 0.50–1.35)

## 2013-02-21 MED ORDER — CARVEDILOL 6.25 MG PO TABS: ORAL_TABLET | ORAL | Status: DC

## 2013-02-21 NOTE — Patient Instructions (Addendum)
Increase coreg to 6.25 mg (1 tablet) in the morning and 12.5 mg (2 tablets) in the pm. If you notice any increased dizziness or fatigue give [place] a call.  Will call with lab results.   F/U 4-6 weeks.  Do the following things EVERYDAY: 1) Weigh yourself in the morning before breakfast. Write it down and keep it in a log. 2) Take your medicines as prescribed 3) Eat low salt foods-Limit salt (sodium) to 2000 mg per day.  4) Stay as active as you can everyday 5) Limit all fluids for the day to less than 2 liters 6)

## 2013-02-21 NOTE — Progress Notes (Signed)
Patient ID: [name], male   DOB: 1965/08/18, 47 y.o.   MRN: [id number]  Weight Range   Baseline proBNP    Referring Physician: Dr. Jens Som Primary Physician: [name], MD  Primary Cardiologist: GT    HPI: [name] is a 47 y.o. male with a history of HIV, HCV, ETOH abuse and CHF due to NICM with EF 20-25%. He also has a h/o syncope and previously had an EP study which was non-inducible for VT. ICD was not placed.   Was recently admitted 01/19/13- 01/22/13 for acute on chronic systolic HF. ECHO EF 20-25%. He was diuresed with IV lasix. He was discharged on coreg 6.25 mg BID, digoxin 0.25 mg daily, lisinopril 40 mg daily, and spironolactone 25 mg daily. Discharge weight 150 lbs.   Post hospital follow up: Since last visit he was admitted to the hospital 9/14-9/16 acute kidney injury likely d/t diarrhea and vomiting. His [name] was stopped not only because of ARI, but patient was complaining of headaches since initiation. No longer has headaches with stopping Spironolactone. Feels much better and not currently on HIV medications. Follows up with Dr. Algis Liming on Thursday to discuss what meds they are going to restart. Denies SOB, orthopnea, CP or edema. Weight at home 151 lbs. Walking about 1 mile everyday.   ** Not on Spiro d/t extreme headaches**  SH: Lives with partner in [place]  ROS: All systems negative except as listed in HPI, PMH and Problem List.  Past Medical History  Diagnosis Date  . Hypertension   . HIV infection   . Hepatitis     Hep B and Hep C (patient does not report this but these are listed in previous notes.)  . G6PD deficiency   . Chronic systolic heart failure     a. Echo 12/05/11:  EF 20-25%, diff HK with mild sparing of IL wall, mild AI, mod MR, mild LAE, mild RVE, mild reduced RVF.;  b.  Echo 05/11/2012:  Mild LVH, EF 20-25%, Gr 1 diast dysfn, mod MR, mild LAE  . NICM (nonischemic cardiomyopathy)     cardia CTA 8/13 negative for obstructive CAD   . CHF (congestive heart failure)   . GERD (gastroesophageal reflux disease)     Current Outpatient Prescriptions  Medication Sig Dispense Refill  . carvedilol (COREG) 6.25 MG tablet Take 1 tablet (6.25 mg total) by mouth 2 (two) times daily with a meal.  60 tablet  6  . digoxin (LANOXIN) 0.25 MG tablet Take 1 tablet (0.25 mg total) by mouth daily.  30 tablet  2  . feeding supplement (ENSURE COMPLETE) LIQD Take 237 mLs by mouth 2 (two) times daily between meals.  60 Bottle  2  . furosemide (LASIX) 20 MG tablet Take 20 mg by mouth 2 (two) times daily.      Marland Kitchen lisinopril (PRINIVIL,ZESTRIL) 40 MG tablet Take 1 tablet (40 mg total) by mouth daily.  30 tablet  11  . omeprazole (PRILOSEC) 20 MG capsule Take 1 capsule (20 mg total) by mouth daily.  30 capsule  11  . valACYclovir (VALTREX) 1000 MG tablet Take 1,000 mg by mouth 2 (two) times daily as needed. Take for 5 days for an outbreak       No current facility-administered medications for this encounter.    Filed Vitals:   02/21/13 1333  BP: 112/78  Pulse: 72  Weight: 156 lb 12 oz (71.101 kg)  SpO2: 98%    Physical Exam:  General: Well  appearing, NAD.  HEENT: normal. + multiple earrings/studs  Neck: supple. JVD flat. Carotids 2+ bilat; no bruits. No lymphadenopathy or thryomegaly appreciated.  Cor: PMI laterally displaced. RRR, 2/6 MR  Lungs: CTA Abdomen: soft, nontender, mildly distended. No hepatosplenomegaly. No bruits or masses. Good bowel sounds.  Extremities: no cyanosis, clubbing, rash,  edema  Neuro: alert & oriented x 3, cranial nerves grossly intact. moves all 4 extremities w/o difficulty. Affect pleasant.  ASSESSMENT & PLAN:  1) Chronic systolic HF, NICM likely r/t ETOH abuse. EF 20-25% diffuse HK, LV diastolic dysfunction. Mild AI, moderate MR, RV mildly dilated and systolic fx mildly reduced, mild/mod TR - NYHA I-II symptoms, volume status good. - Will increase carvedilol to 6.25/12.5 mg daily. Instructed to call if  increase in dizziness or fatigue. - Continue digoxin and lisinopril at current doses. - Not on [name] d/t extreme headaches. - Is interested in referral for ICD once off ETOH x 3 months and if echo shows persistent LV dysfunction - Reinforced the need and importance of daily weights, a low sodium diet, and fluid restriction (less than 2 L a day). Instructed to call the HF clinic if weight increases more than 3 lbs overnight or 5 lbs in a week.  - Will get BMET today.  2) ETOH abuse - Continues to be abstinent   F/U 4-6 weeks  Ulla Potash B,MD 1:41 PM

## 2013-02-22 ENCOUNTER — Other Ambulatory Visit (HOSPITAL_COMMUNITY): Payer: Self-pay | Admitting: *Deleted

## 2013-02-22 MED ORDER — FUROSEMIDE 20 MG PO TABS: 20.0000 mg | ORAL_TABLET | Freq: Two times a day (BID) | ORAL | Status: DC

## 2013-02-23 ENCOUNTER — Encounter: Payer: Self-pay | Admitting: Infectious Disease

## 2013-02-23 ENCOUNTER — Ambulatory Visit (INDEPENDENT_AMBULATORY_CARE_PROVIDER_SITE_OTHER): Payer: Medicare Other | Admitting: Infectious Disease

## 2013-02-23 VITALS — BP 127/92 | HR 78 | Temp 98.4°F | Wt 157.0 lb

## 2013-02-23 DIAGNOSIS — R3989 Other symptoms and signs involving the genitourinary system: Secondary | ICD-10-CM | POA: Diagnosis not present

## 2013-02-23 DIAGNOSIS — R197 Diarrhea, unspecified: Secondary | ICD-10-CM

## 2013-02-23 DIAGNOSIS — B20 Human immunodeficiency virus [HIV] disease: Secondary | ICD-10-CM | POA: Diagnosis not present

## 2013-02-23 DIAGNOSIS — I5023 Acute on chronic systolic (congestive) heart failure: Secondary | ICD-10-CM

## 2013-02-23 DIAGNOSIS — I509 Heart failure, unspecified: Secondary | ICD-10-CM | POA: Diagnosis not present

## 2013-02-23 DIAGNOSIS — R112 Nausea with vomiting, unspecified: Secondary | ICD-10-CM

## 2013-02-23 DIAGNOSIS — N179 Acute kidney failure, unspecified: Secondary | ICD-10-CM | POA: Diagnosis not present

## 2013-02-23 DIAGNOSIS — N19 Unspecified kidney failure: Secondary | ICD-10-CM

## 2013-02-23 MED ORDER — DOLUTEGRAVIR SODIUM 50 MG PO TABS: 50.0000 mg | ORAL_TABLET | Freq: Every day | ORAL | Status: DC

## 2013-02-23 MED ORDER — EMTRICITAB-RILPIVIR-TENOFOV DF 200-25-300 MG PO TABS: 1.0000 | ORAL_TABLET | Freq: Every day | ORAL | Status: DC

## 2013-02-23 NOTE — Progress Notes (Signed)
Subjective:    Patient ID: [name], male    DOB: 1966-03-24, 47 y.o.   MRN: [id number]  HPI  47 year old man with HIV and AIDS congestive heart therapy was had excellent suppression of his HIV virus with undetectable viral load for the last 2 years in a rope. He has had chronic diarrhea likely related to his boosted Norvir with Prezista. He was admitted to the hospital with diarrhea and renal failure in the context of worsening diarrhea nausea and vomiting and also addition of aldactone.his antiretrovirals were stopped while he was in the hospital along with Aldactone. I've looked through his labs I do not think that he had tenofovir induced tubular toxicity.  We examined his prior genotypes and they have shown a cumulative to genotype as follows:  HIVdb: Genotypic Resistance Interpretation Algorithm Date: 23-Feb-2013 13:04:52 PDT   Drug Resistance Interpretation: RT NRTI Resistance Mutations: M184V NNRTI Resistance Mutations: K103N, P225H Other Mutations: None Nucleoside RTI lamivudine (3TC) High-level resistance abacavir (ABC) Low-level resistance zidovudine (AZT) Susceptible stavudine (D4T) Susceptible didanosine (DDI) Potential low-level resistance emtricitabine (FTC) High-level resistance tenofovir (TDF) Susceptible Non-Nucleoside RTI efavirenz (EFV) High-level resistance etravirine (ETR) Susceptible nevirapine (NVP) High-level resistance rilpivirine (RPV) Susceptible  RT Comments NRTI M184V/I cause high-level resistance to 3TC and FTC and low-level resistance to ddI and ABC. However, M184V/I are not contraindications to continued treatment with 3TC or FTC because they increase susceptibility to AZT, TDF and d4T and are associated with clinically significant reductions in HIV-1 replication. In combination with K101E or E138K, M184I synergistically reduces RPV susceptibility. NNRTI K103N is a nonpolymorphic mutation that causes high-level resistance to NVP (~50-fold  reduced susceptibility) and EFV (~20-fold reduced susceptibility). P225H is a nonpolymorphic EFV-selected mutation that usually occurs in combination with K103N. The combination of P225H and K103N causes a >50-fold reduction in EFV susceptibility.  Based on his genotype I think he can be changed to a regimen of COMPLERA with TIVICAY with a meal and with DC of his PPI --which he has only been taking sporadically> This will need to be taken with a  Meal and with avoidance of acid blocking drugs, magnesium, calcium containing meds unless spaced properly, similarly for his ensure (that he states he is not taking regardless)     Review of Systems  Constitutional: Negative for fever, chills, diaphoresis, activity change, appetite change, fatigue and unexpected weight change.  HENT: Negative for congestion, sore throat, rhinorrhea, sneezing, trouble swallowing and sinus pressure.   Eyes: Negative for photophobia and visual disturbance.  Respiratory: Negative for cough, chest tightness, shortness of breath, wheezing and stridor.   Cardiovascular: Negative for chest pain, palpitations and leg swelling.  Gastrointestinal: Positive for diarrhea. Negative for nausea, vomiting, abdominal pain, constipation, blood in stool, abdominal distention and anal bleeding.  Genitourinary: Negative for dysuria, hematuria, flank pain and difficulty urinating.  Musculoskeletal: Negative for myalgias, back pain, joint swelling, arthralgias and gait problem.  Skin: Negative for color change, pallor, rash and wound.  Neurological: Negative for dizziness, tremors, weakness and light-headedness.  Hematological: Negative for adenopathy. Does not bruise/bleed easily.  Psychiatric/Behavioral: Negative for behavioral problems, confusion, sleep disturbance, dysphoric mood, decreased concentration and agitation.       Objective:   Physical Exam  Constitutional: He is oriented to person, place, and time. He appears well-developed  and well-nourished. No distress.  HENT:  Head: Normocephalic and atraumatic.  Mouth/Throat: Oropharynx is clear and moist. No oropharyngeal exudate.  Eyes: Conjunctivae and EOM are normal. Pupils are equal, round, and  reactive to light. No scleral icterus.  Neck: Normal range of motion. Neck supple. No JVD present.  Cardiovascular: Normal rate and regular rhythm.   Pulmonary/Chest: Effort normal and breath sounds normal. No respiratory distress. He has no wheezes. He has no rales. He exhibits no tenderness.  Abdominal: He exhibits no distension and no mass. There is no tenderness. There is no rebound and no guarding.  Musculoskeletal: He exhibits no edema and no tenderness.  Lymphadenopathy:    He has no cervical adenopathy.  Neurological: He is alert and oriented to person, place, and time. He exhibits normal muscle tone. Coordination normal.  Skin: Skin is warm and dry. He is not diaphoretic. No erythema. No pallor.  Psychiatric: He has a normal mood and affect. His behavior is normal. Judgment and thought content normal.          Assessment & Plan:   HIV: Change to TIVICAY and COMPLERA with meal, dc PPI, He'll come back in for labs in 2 months time. I spent greater than 45 minutes with the patient including greater than 50% of time in face to face counsel of the patient and in coordination of their care.   CHF: defer to PCP and CHF MDs. Dr. Luciana Axe started had extensive discussions with the patient regarding his prognosis. Continue other medications with the exception of the aldactone  Renal failure: Appears to been a prerenal type of pathology do not have any suspicion for tenofovir toxicity.

## 2013-02-28 LAB — HIV-1 INTEGRASE GENOTYPE

## 2013-03-06 ENCOUNTER — Ambulatory Visit (HOSPITAL_COMMUNITY)
Admission: RE | Admit: 2013-03-06 | Discharge: 2013-03-06 | Disposition: A | Discharge status: Home / Self Care | Payer: Medicare Other | Source: Ambulatory Visit | Attending: Cardiology | Admitting: Cardiology

## 2013-03-29 ENCOUNTER — Inpatient Hospital Stay (HOSPITAL_COMMUNITY): Admission: RE | Admit: 2013-03-29 | Payer: Medicare Other | Source: Ambulatory Visit

## 2013-04-10 ENCOUNTER — Other Ambulatory Visit: Payer: Medicare Other

## 2013-04-13 ENCOUNTER — Other Ambulatory Visit: Payer: Self-pay | Admitting: Internal Medicine

## 2013-04-13 ENCOUNTER — Other Ambulatory Visit: Payer: Medicare Other

## 2013-04-13 DIAGNOSIS — Z113 Encounter for screening for infections with a predominantly sexual mode of transmission: Secondary | ICD-10-CM | POA: Diagnosis not present

## 2013-04-13 DIAGNOSIS — B20 Human immunodeficiency virus [HIV] disease: Secondary | ICD-10-CM | POA: Diagnosis not present

## 2013-04-13 LAB — CBC WITH DIFFERENTIAL/PLATELET
Basophils Absolute: 0 10*3/uL (ref 0.0–0.1)
Basophils Relative: 1 % (ref 0–1)
Eosinophils Absolute: 0.2 10*3/uL (ref 0.0–0.7)
Eosinophils Relative: 3 % (ref 0–5)
MCH: 29.2 pg (ref 26.0–34.0)
MCHC: 35 g/dL (ref 30.0–36.0)
MCV: 83.6 fL (ref 78.0–100.0)
Platelets: 278 10*3/uL (ref 150–400)
RDW: 14.5 % (ref 11.5–15.5)
WBC: 6.8 10*3/uL (ref 4.0–10.5)

## 2013-04-13 LAB — COMPLETE METABOLIC PANEL WITH GFR
ALT: 28 U/L (ref 0–53)
AST: 33 U/L (ref 0–37)
Albumin: 4.2 g/dL (ref 3.5–5.2)
BUN: 14 mg/dL (ref 6–23)
Calcium: 9.3 mg/dL (ref 8.4–10.5)
Chloride: 109 mEq/L (ref 96–112)
Potassium: 4 mEq/L (ref 3.5–5.3)

## 2013-04-13 MED ORDER — MELOXICAM 7.5 MG PO TABS: 7.5000 mg | ORAL_TABLET | Freq: Two times a day (BID) | ORAL | Status: DC

## 2013-04-13 MED ORDER — COLCHICINE 0.6 MG PO TABS: 0.6000 mg | ORAL_TABLET | Freq: Two times a day (BID) | ORAL | Status: DC

## 2013-04-14 LAB — HIV-1 RNA QUANT-NO REFLEX-BLD: HIV 1 RNA Quant: <20 copies/mL (ref <20)

## 2013-04-20 ENCOUNTER — Ambulatory Visit (HOSPITAL_COMMUNITY)
Admission: RE | Admit: 2013-04-20 | Discharge: 2013-04-20 | Disposition: A | Discharge status: Home / Self Care | Payer: Medicare Other | Source: Ambulatory Visit | Attending: Internal Medicine | Admitting: Internal Medicine

## 2013-04-20 VITALS — BP 140/100 | HR 87 | Wt 161.8 lb

## 2013-04-20 DIAGNOSIS — I5022 Chronic systolic (congestive) heart failure: Secondary | ICD-10-CM

## 2013-04-20 DIAGNOSIS — F1010 Alcohol abuse, uncomplicated: Secondary | ICD-10-CM | POA: Insufficient documentation

## 2013-04-20 DIAGNOSIS — I428 Other cardiomyopathies: Secondary | ICD-10-CM | POA: Insufficient documentation

## 2013-04-20 DIAGNOSIS — I059 Rheumatic mitral valve disease, unspecified: Secondary | ICD-10-CM | POA: Insufficient documentation

## 2013-04-20 MED ORDER — CARVEDILOL 12.5 MG PO TABS: 12.5000 mg | ORAL_TABLET | Freq: Two times a day (BID) | ORAL | Status: DC

## 2013-04-20 NOTE — Patient Instructions (Addendum)
Increase your coreg to 12.5 mg twice a day. You currently have 6.25 mg tablets so you will take 2 tablets every morning and 2 tablets every evening until you pick up your new prescription and then you will take 1 tablet in the morning and 1 tablet in the evening.  Stop your meloxicam, Mobic  Get an ECHO  Follow up 1 month.  Have a good Holiday Season.  Call any issues [phone].  Do the following things EVERYDAY: 1) Weigh yourself in the morning before breakfast. Write it down and keep it in a log. 2) Take your medicines as prescribed 3) Eat low salt foods-Limit salt (sodium) to 2000 mg per day.  4) Stay as active as you can everyday 5) Limit all fluids for the day to less than 2 liters 6)

## 2013-04-20 NOTE — Progress Notes (Signed)
Patient ID: [name], male   DOB: January 23, 1966, 47 y.o.   MRN: [id number]   Weight Range   Baseline proBNP    Referring Physician: Dr. Jens Som Primary Physician: [name], MD  Primary Cardiologist: GT    HPI: [name] is a 47 y.o. male with a history of HIV, HCV, ETOH abuse and CHF due to NICM with EF 20-25%. He also has a h/o syncope and previously had an EP study which was non-inducible for VT. ICD was not placed.   Was recently admitted 01/19/13- 01/22/13 for acute on chronic systolic HF. ECHO EF 20-25%. He was diuresed with IV lasix. He was discharged on coreg 6.25 mg BID, digoxin 0.25 mg daily, lisinopril 40 mg daily, and spironolactone 25 mg daily. Discharge weight 150 lbs.   Follow up: Last visit increased coreg to 6.25/12.5 mg, however a week ago ran out of 12.5 mg tablets and went back to 6.25 mg BID. Feeling good other than had a gout flare. Has an occasional drink every once in awhile and has noticed since he quit drinking, he is eating a lot. Weight is going up 160-162 lbs. Denies SOB, orthopnea, CP, or fatigue. On 2 HIV medications now and follows up with Dr. Luciana Axe on Monday.  Walking about a mile everyday.   ** Not on Spiro d/t extreme headaches**  SH: Lives with partner in [place]  ROS: All systems negative except as listed in HPI, PMH and Problem List.  Past Medical History  Diagnosis Date  . Hypertension   . HIV infection   . Hepatitis     Hep B and Hep C (patient does not report this but these are listed in previous notes.)  . G6PD deficiency   . Chronic systolic heart failure     a. Echo 12/05/11:  EF 20-25%, diff HK with mild sparing of IL wall, mild AI, mod MR, mild LAE, mild RVE, mild reduced RVF.;  b.  Echo 05/11/2012:  Mild LVH, EF 20-25%, Gr 1 diast dysfn, mod MR, mild LAE  . NICM (nonischemic cardiomyopathy)     cardia CTA 8/13 negative for obstructive CAD  . CHF (congestive heart failure)   . GERD (gastroesophageal reflux disease)   .  Depression     Current Outpatient Prescriptions  Medication Sig Dispense Refill  . carvedilol (COREG) 6.25 MG tablet Take 6.25 mg by mouth 2 (two) times daily with a meal.      . colchicine 0.6 MG tablet Take 1 tablet (0.6 mg total) by mouth 2 (two) times daily.  30 tablet  2  . digoxin (LANOXIN) 0.25 MG tablet Take 1 tablet (0.25 mg total) by mouth daily.  30 tablet  2  . dolutegravir (TIVICAY) 50 MG tablet Take 1 tablet (50 mg total) by mouth daily before lunch.  30 tablet  11  . Emtricitab-Rilpivir-Tenofovir 200-25-300 MG TABS Take 1 tablet by mouth daily before lunch.  30 tablet  11  . furosemide (LASIX) 20 MG tablet Take 1 tablet (20 mg total) by mouth 2 (two) times daily.  30 tablet  6  . HYDROcodone-acetaminophen (NORCO/VICODIN) 5-325 MG per tablet       . lisinopril (PRINIVIL,ZESTRIL) 40 MG tablet Take 1 tablet (40 mg total) by mouth daily.  30 tablet  11  . meloxicam (MOBIC) 7.5 MG tablet Take 1 tablet (7.5 mg total) by mouth 2 (two) times daily.  60 tablet  1  . spironolactone (ALDACTONE) 25 MG tablet       .  valACYclovir (VALTREX) 1000 MG tablet Take 1,000 mg by mouth 2 (two) times daily as needed. Take for 5 days for an outbreak       No current facility-administered medications for this encounter.    Filed Vitals:   04/20/13 1131  BP: 140/100  Pulse: 87  Weight: 161 lb 12 oz (73.369 kg)  SpO2: 97%    Physical Exam:  General: Well appearing, NAD.  HEENT: normal. + multiple earrings/studs  Neck: supple. JVD flat. Carotids 2+ bilat; no bruits. No lymphadenopathy or thryomegaly appreciated.  Cor: PMI laterally displaced. RRR, 2/6 MR  Lungs: CTA Abdomen: soft, nontender, mildly distended. No hepatosplenomegaly. No bruits or masses. Good bowel sounds.  Extremities: no cyanosis, clubbing, rash,  edema  Neuro: alert & oriented x 3, cranial nerves grossly intact. moves all 4 extremities w/o difficulty. Affect pleasant.  ASSESSMENT & PLAN:  1) Chronic systolic HF:, NICM  likely r/t ETOH abuse. EF 20-25% diffuse HK, LV diastolic dysfunction. Mild AI, moderate MR, RV mildly dilated and systolic fx mildly reduced, mild/mod TR (12/2012) - NYHA I-II symptoms, volume status good. His weight is up, but does not appear to be fluid. He is eating more since he stopped drinking. - Will increase carvedilol to 12.5 mg BID. Instructed to call if increase in dizziness or fatigue. - Continue digoxin and lisinopril at current doses. Will need to check dig level at next appt. - Not on [name] d/t extreme headaches. - He has now abstained from ETOH for 3 months. He does have one glass of Wine on Sundays, however that is all. Will get an ECHO on Monday and if EF remains less than 35% will refer to EP for consideration of ICD. - Reinforced the need and importance of daily weights, a low sodium diet, and fluid restriction (less than 2 L a day). Instructed to call the HF clinic if weight increases more than 3 lbs overnight or 5 lbs in a week. 2) ETOH abuse - Only drinks one glass of wine on Sundays. Hoping that EF has improved.   F/U 1 month  Aundria Rud, NP-C 11:45 AM   Patient seen and examined with Ulla Potash, NP. We discussed all aspects of the encounter. I agree with the assessment and plan as stated above. Agree with increasing carvedilol.  He is much improved. NYHA I-II. Volume status looks good. Congratulated on reducing ETOH intake but stressed need for complete abstinence. Will repeat echo soon and if EF remains < = 35% will refer for ICD> Reinforced need for daily weights and reviewed use of sliding scale diuretics.  Lessie Manigo,MD 12:10 AM

## 2013-04-24 ENCOUNTER — Ambulatory Visit (INDEPENDENT_AMBULATORY_CARE_PROVIDER_SITE_OTHER): Payer: Medicare Other | Admitting: Internal Medicine

## 2013-04-24 ENCOUNTER — Telehealth (HOSPITAL_COMMUNITY): Payer: Self-pay | Admitting: Anesthesiology

## 2013-04-24 ENCOUNTER — Encounter: Payer: Self-pay | Admitting: Internal Medicine

## 2013-04-24 ENCOUNTER — Ambulatory Visit (HOSPITAL_COMMUNITY)
Admission: RE | Admit: 2013-04-24 | Discharge: 2013-04-24 | Disposition: A | Discharge status: Home / Self Care | Payer: Medicare Other | Source: Ambulatory Visit | Attending: Emergency Medicine | Admitting: Emergency Medicine

## 2013-04-24 ENCOUNTER — Telehealth: Payer: Self-pay | Admitting: *Deleted

## 2013-04-24 VITALS — BP 146/105 | HR 91 | Temp 98.6°F | Wt 163.0 lb

## 2013-04-24 DIAGNOSIS — F1010 Alcohol abuse, uncomplicated: Secondary | ICD-10-CM | POA: Insufficient documentation

## 2013-04-24 DIAGNOSIS — B20 Human immunodeficiency virus [HIV] disease: Secondary | ICD-10-CM | POA: Diagnosis not present

## 2013-04-24 DIAGNOSIS — M109 Gout, unspecified: Secondary | ICD-10-CM | POA: Insufficient documentation

## 2013-04-24 DIAGNOSIS — A6000 Herpesviral infection of urogenital system, unspecified: Secondary | ICD-10-CM

## 2013-04-24 DIAGNOSIS — B1920 Unspecified viral hepatitis C without hepatic coma: Secondary | ICD-10-CM | POA: Insufficient documentation

## 2013-04-24 DIAGNOSIS — I2589 Other forms of chronic ischemic heart disease: Secondary | ICD-10-CM | POA: Insufficient documentation

## 2013-04-24 DIAGNOSIS — B1710 Acute hepatitis C without hepatic coma: Secondary | ICD-10-CM | POA: Diagnosis not present

## 2013-04-24 DIAGNOSIS — I5022 Chronic systolic (congestive) heart failure: Secondary | ICD-10-CM

## 2013-04-24 DIAGNOSIS — Z21 Asymptomatic human immunodeficiency virus [HIV] infection status: Secondary | ICD-10-CM | POA: Insufficient documentation

## 2013-04-24 DIAGNOSIS — I059 Rheumatic mitral valve disease, unspecified: Secondary | ICD-10-CM | POA: Diagnosis not present

## 2013-04-24 NOTE — Assessment & Plan Note (Signed)
He is doing well on his new regimen and will return in 4 months.

## 2013-04-24 NOTE — Assessment & Plan Note (Signed)
As resolved with colchicine. No interaction with colchicine though with Meloxicam was told there is a contraindication by his cardiologist so this will be avoided in the future

## 2013-04-24 NOTE — Assessment & Plan Note (Signed)
No current flareups.  He continues on Valtrex

## 2013-04-24 NOTE — Assessment & Plan Note (Signed)
Counseled on alcohol abuse

## 2013-04-24 NOTE — Progress Notes (Signed)
  Subjective:    Patient ID: [name], male    DOB: [birth year], 47 y.o.   MRN: [id number]  HPI He comes in for followup of his HIV. He was last seen 2 months ago and changed to tivicay with Complera due to side effects. He since has been taking that daily and likes the medication and is pleased with his new regimen. He also had a flare of gout in his right first toe and I gave him colchicine and has now resolved. I also gave him meloxicam however his cardiologist is concerned with interactions with his drugs and so this was stopped. Indomethacin was avoided to interactions. He otherwise is again drinking alcohol and does not feel like she is going to stop at all. He does understand the consequences with his cardiomyopathy.   Review of Systems  Constitutional: Negative for fever, chills and fatigue.  Eyes: Negative for visual disturbance.  Respiratory: Negative for cough and shortness of breath.   Cardiovascular: Negative for chest pain, palpitations and leg swelling.  Gastrointestinal: Negative for nausea, abdominal pain and diarrhea.  Musculoskeletal: Negative for back pain.  Skin: Negative for rash.  Neurological: Negative for dizziness and light-headedness.  Hematological: Negative for adenopathy.  Psychiatric/Behavioral: Negative for dysphoric mood.       Objective:   Physical Exam  Constitutional: He is oriented to person, place, and time. He appears well-developed and well-nourished. No distress.  HENT:  Mouth/Throat: No oropharyngeal exudate.  Eyes: Right eye exhibits no discharge. Left eye exhibits no discharge. No scleral icterus.  Cardiovascular: Normal rate, regular rhythm and normal heart sounds.   Pulmonary/Chest: Effort normal and breath sounds normal. No respiratory distress.  Lymphadenopathy:    He has no cervical adenopathy.  Neurological: He is oriented to person, place, and time.  Skin: Skin is warm and dry. No rash noted.  Psychiatric: He has a normal mood  and affect. His behavior is normal.          Assessment & Plan:

## 2013-04-24 NOTE — Telephone Encounter (Signed)
Form faxed to [facility] for scat transportation.  [name]

## 2013-04-24 NOTE — Progress Notes (Signed)
Echo Lab  2D Echocardiogram completed.  Karthik Whittinghill L Kadelyn Dimascio, RDCS 04/24/2013 11:19 AM

## 2013-04-24 NOTE — Telephone Encounter (Signed)
Called patient about ECHo results. EF remains 20-25%. Will see back end of December to titrate meds and refer to EP for ICD.  Ulla Potash B NP-C 1:50 PM

## 2013-05-29 ENCOUNTER — Ambulatory Visit (HOSPITAL_COMMUNITY): Payer: Medicare Other

## 2013-05-31 ENCOUNTER — Encounter (HOSPITAL_COMMUNITY): Payer: Self-pay

## 2013-05-31 ENCOUNTER — Ambulatory Visit (HOSPITAL_COMMUNITY)
Admission: RE | Admit: 2013-05-31 | Discharge: 2013-05-31 | Disposition: A | Discharge status: Home / Self Care | Payer: Medicare Other | Source: Ambulatory Visit | Attending: Internal Medicine | Admitting: Internal Medicine

## 2013-05-31 VITALS — BP 126/84 | HR 90 | Wt 159.1 lb

## 2013-05-31 DIAGNOSIS — F1010 Alcohol abuse, uncomplicated: Secondary | ICD-10-CM

## 2013-05-31 DIAGNOSIS — F1910 Other psychoactive substance abuse, uncomplicated: Secondary | ICD-10-CM

## 2013-05-31 DIAGNOSIS — I5022 Chronic systolic (congestive) heart failure: Secondary | ICD-10-CM | POA: Insufficient documentation

## 2013-05-31 DIAGNOSIS — I10 Essential (primary) hypertension: Secondary | ICD-10-CM | POA: Insufficient documentation

## 2013-05-31 LAB — BASIC METABOLIC PANEL
CO2: 21 mEq/L (ref 19–32)
Calcium: 9.2 mg/dL (ref 8.4–10.5)
Creatinine, Ser: 1.06 mg/dL (ref 0.50–1.35)
GFR calc Af Amer: >90 mL/min (ref >90)
Sodium: 144 mEq/L (ref 137–147)

## 2013-05-31 LAB — DIGOXIN LEVEL: Digoxin Level: 0.5 ng/mL — ABNORMAL LOW (ref 0.8–2.0)

## 2013-05-31 MED ORDER — CARVEDILOL 12.5 MG PO TABS: 18.7500 mg | ORAL_TABLET | Freq: Two times a day (BID) | ORAL | Status: DC

## 2013-05-31 NOTE — Patient Instructions (Signed)
Increase your coreg to 12.5 mg (1 tablet) in the morning and 18.75 mg (1 1/2) tablet for one week and if you tolerate it then increase to 18.75 mg (1 1/2 tablet) every morning and every night.  I will call with your labs.  Follow up 4-6 weeks.  Call any issues.  Will refer to EP for ICD.  Do the following things EVERYDAY: 1) Weigh yourself in the morning before breakfast. Write it down and keep it in a log. 2) Take your medicines as prescribed 3) Eat low salt foods-Limit salt (sodium) to 2000 mg per day.  4) Stay as active as you can everyday 5) Limit all fluids for the day to less than 2 liters 6)

## 2013-05-31 NOTE — Progress Notes (Addendum)
Patient ID: [name], male   DOB: 1966-01-26, 47 y.o.   MRN: [id number]   Weight Range   Baseline proBNP    Referring Physician: Dr. Jens Som Primary Physician: [name], MD  Primary Cardiologist: GT    HPI: [name] is a 47 y.o. male with a history of HIV, HCV, ETOH abuse and CHF due to NICM with EF 20-25%. He also has a h/o syncope and previously had an EP study which was non-inducible for VT. ICD was not placed.   Was recently admitted 01/19/13- 01/22/13 for acute on chronic systolic HF. ECHO EF 20-25%. He was diuresed with IV lasix. He was discharged on coreg 6.25 mg BID, digoxin 0.25 mg daily, lisinopril 40 mg daily, and spironolactone 25 mg daily. Discharge weight 150 lbs.   ECHO 04/2013: EF 20-25%, mod/severe MR, LA mod dilated  Follow up: Last visit increased coreg to 12.5 mg BID and tolerated well. Doing well. Denies SOB, orthopnea or CP. ECHO repeated and EF remains less than 25%. Having issues with transportation and is getting set up for SCAT in January. Drinking less than 2L a day and following a low salt diet. Weight 158-159 lbs. Does not drink. [facility] following. Following with Dr. Luciana Axe. Denies dizziness.   ** Not on Spiro d/t extreme headaches**  SH: Lives with partner in [place]  ROS: All systems negative except as listed in HPI, PMH and Problem List.  Past Medical History  Diagnosis Date  . Hypertension   . HIV infection   . Hepatitis     Hep B and Hep C (patient does not report this but these are listed in previous notes.)  . G6PD deficiency   . Chronic systolic heart failure     a. Echo 12/05/11:  EF 20-25%, diff HK with mild sparing of IL wall, mild AI, mod MR, mild LAE, mild RVE, mild reduced RVF.;  b.  Echo 05/11/2012:  Mild LVH, EF 20-25%, Gr 1 diast dysfn, mod MR, mild LAE  . NICM (nonischemic cardiomyopathy)     cardia CTA 8/13 negative for obstructive CAD  . CHF (congestive heart failure)   . GERD (gastroesophageal reflux disease)   .  Depression     Current Outpatient Prescriptions  Medication Sig Dispense Refill  . carvedilol (COREG) 12.5 MG tablet Take 1 tablet (12.5 mg total) by mouth 2 (two) times daily with a meal.  60 tablet  6  . colchicine 0.6 MG tablet Take 1 tablet (0.6 mg total) by mouth 2 (two) times daily.  30 tablet  2  . digoxin (LANOXIN) 0.25 MG tablet Take 1 tablet (0.25 mg total) by mouth daily.  30 tablet  2  . dolutegravir (TIVICAY) 50 MG tablet Take 1 tablet (50 mg total) by mouth daily before lunch.  30 tablet  11  . Emtricitab-Rilpivir-Tenofovir 200-25-300 MG TABS Take 1 tablet by mouth daily before lunch.  30 tablet  11  . furosemide (LASIX) 20 MG tablet Take 1 tablet (20 mg total) by mouth 2 (two) times daily.  30 tablet  6  . HYDROcodone-acetaminophen (NORCO/VICODIN) 5-325 MG per tablet       . lisinopril (PRINIVIL,ZESTRIL) 40 MG tablet Take 1 tablet (40 mg total) by mouth daily.  30 tablet  11  . valACYclovir (VALTREX) 1000 MG tablet Take 1,000 mg by mouth 2 (two) times daily as needed. Take for 5 days for an outbreak       No current facility-administered medications for this encounter.  Filed Vitals:   05/31/13 1124  BP: 126/84  Pulse: 90  Weight: 159 lb 1.9 oz (72.176 kg)  SpO2: 98%    Physical Exam:  General: Well appearing, NAD.  HEENT: normal. + multiple earrings/studs  Neck: supple. JVD flat. Carotids 2+ bilat; no bruits. No lymphadenopathy or thryomegaly appreciated.  Cor: PMI laterally displaced. RRR, 2/6 MR  Lungs: CTA Abdomen: soft, nontender, distended. No hepatosplenomegaly. No bruits or masses. Good bowel sounds.  Extremities: no cyanosis, clubbing, rash, edema  Neuro: alert & oriented x 3, cranial nerves grossly intact. moves all 4 extremities w/o difficulty. Affect pleasant.  ASSESSMENT & PLAN:  1) Chronic systolic HF:, NICM likely r/t ETOH abuse.  - NYHA I symptoms and volume status stable. Will continue lasix 20 mg BID. - Will increase coreg to 12.5 mg q am and  18.75 mg q pm for a week and instructed the patient if he feels well with increase and has no dizziness then to increase the following week to 18.75 mg BID. Patient is aware and sent prescription in to pharmacy. - ECHO reviewed and EF remains 20-25%. Will refer to EP for ICD consideration. Would prefer [employer] for Heart Failure hemodynamics. - On max dose lisinopril 40 mg daily. He is not on Spiro d/t headaches. - Reinforced the need and importance of daily weights, a low sodium diet, and fluid restriction (less than 2 L a day). Instructed to call the HF clinic if weight increases more than 3 lbs overnight or 5 lbs in a week. 2) ETOH abuse - Only drinks an occassional glass of wine on Sundays. Congratulated him on cutting back so extensively.  3) HTN - Controlled on current medications. Will increase coreg as above for LV dsyfunction.  F/U 4-6 weeks for medication titration  Ulla Potash B, NP-C 11:42 AM

## 2013-06-13 ENCOUNTER — Ambulatory Visit (INDEPENDENT_AMBULATORY_CARE_PROVIDER_SITE_OTHER): Payer: Medicare Other | Admitting: Internal Medicine

## 2013-06-13 ENCOUNTER — Encounter: Payer: Self-pay | Admitting: Internal Medicine

## 2013-06-13 ENCOUNTER — Encounter: Payer: Self-pay | Admitting: *Deleted

## 2013-06-13 VITALS — BP 123/95 | HR 80 | Ht 64.0 in | Wt 151.5 lb

## 2013-06-13 DIAGNOSIS — I472 Ventricular tachycardia: Secondary | ICD-10-CM

## 2013-06-13 DIAGNOSIS — I4729 Other ventricular tachycardia: Secondary | ICD-10-CM | POA: Diagnosis not present

## 2013-06-13 DIAGNOSIS — I428 Other cardiomyopathies: Secondary | ICD-10-CM | POA: Diagnosis not present

## 2013-06-13 DIAGNOSIS — I509 Heart failure, unspecified: Secondary | ICD-10-CM | POA: Diagnosis not present

## 2013-06-13 DIAGNOSIS — I5022 Chronic systolic (congestive) heart failure: Secondary | ICD-10-CM | POA: Diagnosis not present

## 2013-06-13 DIAGNOSIS — F1910 Other psychoactive substance abuse, uncomplicated: Secondary | ICD-10-CM

## 2013-06-13 LAB — BASIC METABOLIC PANEL
BUN: 12 mg/dL (ref 6–23)
CHLORIDE: 104 meq/L (ref 96–112)
CO2: 27 mEq/L (ref 19–32)
Calcium: 9.2 mg/dL (ref 8.4–10.5)
Creatinine, Ser: 1.3 mg/dL (ref 0.4–1.5)
GFR: 74.49 mL/min (ref >60.00)
GLUCOSE: 112 mg/dL — AB (ref 70–99)
Potassium: 3.5 mEq/L (ref 3.5–5.1)
Sodium: 140 mEq/L (ref 135–145)

## 2013-06-13 LAB — CBC WITH DIFFERENTIAL/PLATELET
BASOS ABS: 0 10*3/uL (ref 0.0–0.1)
Basophils Relative: 0.5 % (ref 0.0–3.0)
Eosinophils Absolute: 0.1 10*3/uL (ref 0.0–0.7)
Eosinophils Relative: 1.2 % (ref 0.0–5.0)
HCT: 43.5 % (ref 39.0–52.0)
Hemoglobin: 14.4 g/dL (ref 13.0–17.0)
LYMPHS ABS: 2.1 10*3/uL (ref 0.7–4.0)
Lymphocytes Relative: 23 % (ref 12.0–46.0)
MCHC: 33.2 g/dL (ref 30.0–36.0)
MCV: 86.8 fl (ref 78.0–100.0)
MONO ABS: 0.6 10*3/uL (ref 0.1–1.0)
Monocytes Relative: 6.2 % (ref 3.0–12.0)
Neutro Abs: 6.3 10*3/uL (ref 1.4–7.7)
Neutrophils Relative %: 69.1 % (ref 43.0–77.0)
PLATELETS: 266 10*3/uL (ref 150.0–400.0)
RBC: 5.01 Mil/uL (ref 4.22–5.81)
RDW: 14.1 % (ref 11.5–14.6)
WBC: 9.1 10*3/uL (ref 4.5–10.5)

## 2013-06-13 NOTE — Progress Notes (Signed)
HPI [name] returns today for followup. He has long-standing nonischemic cardiomyopathy and chronic class 1-2 systolic heart failure. He has remote history of syncope, which predates his development of left ventricular dysfunction. He underwent EP testing several months ago demonstrated no inducible ventricular arrhythmias. He has had worsening LV systolic function and his QRS duration has increased, now 155 ms with a lbbb/ivcd pattern. He has not had recurrent syncope but does continue to have chronic dizziness. He has class 2 CHF symptoms. Allergies  Allergen Reactions  . Bactrim Rash     Current Outpatient Prescriptions  Medication Sig Dispense Refill  . carvedilol (COREG) 12.5 MG tablet Take 1.5 tablets (18.75 mg total) by mouth 2 (two) times daily with a meal.  90 tablet  3  . colchicine 0.6 MG tablet Take 1 tablet (0.6 mg total) by mouth 2 (two) times daily.  30 tablet  2  . digoxin (LANOXIN) 0.25 MG tablet Take 1 tablet (0.25 mg total) by mouth daily.  30 tablet  2  . dolutegravir (TIVICAY) 50 MG tablet Take 1 tablet (50 mg total) by mouth daily before lunch.  30 tablet  11  . Emtricitab-Rilpivir-Tenofovir 200-25-300 MG TABS Take 1 tablet by mouth daily before lunch.  30 tablet  11  . furosemide (LASIX) 20 MG tablet Take 1 tablet (20 mg total) by mouth 2 (two) times daily.  30 tablet  6  . lisinopril (PRINIVIL,ZESTRIL) 40 MG tablet Take 1 tablet (40 mg total) by mouth daily.  30 tablet  11  . omeprazole (PRILOSEC) 20 MG capsule Take 1 capsule by mouth daily.      . valACYclovir (VALTREX) 1000 MG tablet Take 1,000 mg by mouth 2 (two) times daily as needed. Take for 5 days for an outbreak       No current facility-administered medications for this visit.     Past Medical History  Diagnosis Date  . Hypertension   . HIV infection   . Hepatitis     Hep B and Hep C (patient does not report this but these are listed in previous notes.)  . G6PD deficiency   . Chronic systolic heart  failure     a. Echo 12/05/11:  EF 20-25%, diff HK with mild sparing of IL wall, mild AI, mod MR, mild LAE, mild RVE, mild reduced RVF.;  b.  Echo 05/11/2012:  Mild LVH, EF 20-25%, Gr 1 diast dysfn, mod MR, mild LAE  . NICM (nonischemic cardiomyopathy)     cardia CTA 8/13 negative for obstructive CAD  . CHF (congestive heart failure)   . GERD (gastroesophageal reflux disease)   . Depression     ROS:   All systems reviewed and negative except as noted in the HPI.   Past Surgical History  Procedure Laterality Date  . Finger surgery      Thumb laceration.    . Esophagogastroduodenoscopy  12/07/2011    Procedure: ESOPHAGOGASTRODUODENOSCOPY (EGD);  Surgeon: [name], MD,FACG;  Location[facility] END[facility]COPY;  Service: Endoscopy;  Laterality: N/A;     Family History  Problem Relation Age of Onset  . Cancer Mother      History   Social History  . Marital Status: Single    Spouse Name: N/A    Number of Children: N/A  . Years of Education: N/A   Occupational History  . Unemployed    Social History Main Topics  . Smoking status: Never Smoker   . Smokeless tobacco: Never Used  . Alcohol Use:  0.6 oz/week    1 Cans of beer per week     Comment: cutting back - knows he cannot. "drink one to get rid of the urge"/ quit 1 month ago  . Drug Use: No     Comment: marijuana daily  . Sexual Activity: Yes    Partners: Male     Comment: 28 year relationship, declined condoms   Other Topics Concern  . Not on file   Social History Narrative   Lives alone.  Drinks beer daily.  Liquor rarely.  He occasionally smokes marijuana..     BP 123/95  Pulse 80  Ht 5' 4" (1.626 m)  Wt 151 lb 8 oz (68.72 kg)  BMI 25.99 kg/m2  Physical Exam:  Well appearing middle-aged man, NAD HEENT: Unremarkable Neck:  7 cm JVD, no thyromegally Back:  No CVA tenderness Lungs:  Clear with no wheezes, rales, or rhonchi. HEART:  Regular rate rhythm, no murmurs, no rubs, no clicks, split S2 Abd:  soft,  positive bowel sounds, no organomegally, no rebound, no guarding Ext:  2 plus pulses, no edema, no cyanosis, no clubbing Skin:  No rashes no nodules Neuro:  CN II through XII intact, motor grossly intact  EKG - normal sinus rhythm with left bundle branch block, QRS duration 155   Assess/Plan: 

## 2013-06-13 NOTE — Assessment & Plan Note (Signed)
His congestive heart failure symptoms are currently class II. He has left bundle branch block with a QRS duration of 155 ms. His heart failure has worsened and his left ventricular dysfunction has also worsened. I discussed the treatment options. The risk, goals, benefits, and expectations of biventricular ICD implantation a been discussed with the patient and he wishes to proceed. The procedure will be scheduled in the next few weeks.

## 2013-06-13 NOTE — Patient Instructions (Signed)
Your physician has recommended that you have a Bi-V defibrillator inserted. An implantable cardioverter defibrillator (ICD) is a small device that is placed in your chest or, in rare cases, your abdomen. This device uses electrical pulses or shocks to help control life-threatening, irregular heartbeats that could lead the heart to suddenly stop beating (sudden cardiac arrest). Leads are attached to the ICD that goes into your heart. This is done in the hospital and usually requires an overnight stay. Please see the instruction sheet given to you today for more information.   See instruction sheet

## 2013-06-13 NOTE — Assessment & Plan Note (Signed)
The patient has been without substance abuse now for many months.  He does not use cocaine or marijuana for over a year. I've encouraged him to continue in this.

## 2013-06-14 ENCOUNTER — Other Ambulatory Visit (HOSPITAL_COMMUNITY): Payer: Self-pay | Admitting: Internal Medicine

## 2013-06-14 NOTE — Telephone Encounter (Signed)
[name], this is not an internal medicine pt but when he was in hospital at [facility] he was prescribed digoxin and it is on his med list. If he is not on it, it probably needs to be taken off the med list

## 2013-06-14 NOTE — Telephone Encounter (Signed)
[name], this patient has a cardiologist that he currently sees. Refill should probably come from that provider. [name]

## 2013-06-21 ENCOUNTER — Institutional Professional Consult (permissible substitution): Payer: Medicare Other | Admitting: Internal Medicine

## 2013-06-21 ENCOUNTER — Other Ambulatory Visit (HOSPITAL_COMMUNITY): Payer: Self-pay | Admitting: *Deleted

## 2013-06-21 MED ORDER — DIGOXIN 250 MCG PO TABS: 0.2500 mg | ORAL_TABLET | Freq: Every day | ORAL | Status: DC

## 2013-06-23 ENCOUNTER — Encounter (HOSPITAL_COMMUNITY): Payer: Self-pay | Admitting: Pharmacy Technician

## 2013-06-25 MED ORDER — CHLORHEXIDINE GLUCONATE 4 % EX LIQD
60.0000 mL | Freq: Once | CUTANEOUS | Status: DC
  Filled 2013-06-25: qty 60

## 2013-06-25 MED ORDER — SODIUM CHLORIDE 0.9 % IR SOLN
80.0000 mg | Status: DC
  Filled 2013-06-25: qty 2

## 2013-06-25 MED ORDER — SODIUM CHLORIDE 0.9 % IV SOLN
INTRAVENOUS | Status: DC
  Administered 2013-06-26: 07:00:00 via INTRAVENOUS

## 2013-06-25 MED ORDER — CEFAZOLIN SODIUM-DEXTROSE 2-3 GM-% IV SOLR
2.0000 g | INTRAVENOUS | Status: DC
  Filled 2013-06-25: qty 50

## 2013-06-26 ENCOUNTER — Encounter (HOSPITAL_COMMUNITY)
Admission: RE | Disposition: A | Discharge status: Home / Self Care | Payer: Self-pay | Source: Ambulatory Visit | Attending: Internal Medicine

## 2013-06-26 ENCOUNTER — Encounter (HOSPITAL_COMMUNITY): Payer: Self-pay | Admitting: *Deleted

## 2013-06-26 ENCOUNTER — Ambulatory Visit (HOSPITAL_COMMUNITY)
Admission: RE | Admit: 2013-06-26 | Discharge: 2013-06-27 | Disposition: A | Discharge status: Home / Self Care | Payer: Medicare Other | Source: Ambulatory Visit | Attending: Internal Medicine | Admitting: Internal Medicine

## 2013-06-26 DIAGNOSIS — B1910 Unspecified viral hepatitis B without hepatic coma: Secondary | ICD-10-CM | POA: Insufficient documentation

## 2013-06-26 DIAGNOSIS — F3289 Other specified depressive episodes: Secondary | ICD-10-CM | POA: Insufficient documentation

## 2013-06-26 DIAGNOSIS — F1210 Cannabis abuse, uncomplicated: Secondary | ICD-10-CM | POA: Insufficient documentation

## 2013-06-26 DIAGNOSIS — D551 Anemia due to other disorders of glutathione metabolism: Secondary | ICD-10-CM | POA: Insufficient documentation

## 2013-06-26 DIAGNOSIS — I10 Essential (primary) hypertension: Secondary | ICD-10-CM | POA: Insufficient documentation

## 2013-06-26 DIAGNOSIS — I5022 Chronic systolic (congestive) heart failure: Secondary | ICD-10-CM

## 2013-06-26 DIAGNOSIS — F329 Major depressive disorder, single episode, unspecified: Secondary | ICD-10-CM | POA: Insufficient documentation

## 2013-06-26 DIAGNOSIS — B1920 Unspecified viral hepatitis C without hepatic coma: Secondary | ICD-10-CM | POA: Insufficient documentation

## 2013-06-26 DIAGNOSIS — I509 Heart failure, unspecified: Secondary | ICD-10-CM | POA: Insufficient documentation

## 2013-06-26 DIAGNOSIS — I428 Other cardiomyopathies: Secondary | ICD-10-CM | POA: Diagnosis not present

## 2013-06-26 DIAGNOSIS — I4729 Other ventricular tachycardia: Secondary | ICD-10-CM

## 2013-06-26 DIAGNOSIS — K219 Gastro-esophageal reflux disease without esophagitis: Secondary | ICD-10-CM | POA: Insufficient documentation

## 2013-06-26 DIAGNOSIS — I472 Ventricular tachycardia: Secondary | ICD-10-CM

## 2013-06-26 DIAGNOSIS — Z21 Asymptomatic human immunodeficiency virus [HIV] infection status: Secondary | ICD-10-CM | POA: Insufficient documentation

## 2013-06-26 DIAGNOSIS — I447 Left bundle-branch block, unspecified: Secondary | ICD-10-CM | POA: Insufficient documentation

## 2013-06-26 HISTORY — PX: BI-VENTRICULAR PACEMAKER INSERTION: SHX5462

## 2013-06-26 LAB — SURGICAL PCR SCREEN
MRSA, PCR: NEGATIVE
Staphylococcus aureus: NEGATIVE

## 2013-06-26 SURGERY — BI-VENTRICULAR PACEMAKER INSERTION (CRT-P)
Anesthesia: LOCAL

## 2013-06-26 MED ORDER — MUPIROCIN 2 % EX OINT
TOPICAL_OINTMENT | CUTANEOUS | Status: AC
  Filled 2013-06-26: qty 22

## 2013-06-26 MED ORDER — CARVEDILOL 6.25 MG PO TABS
18.7500 mg | ORAL_TABLET | Freq: Two times a day (BID) | ORAL | Status: DC
  Administered 2013-06-26 – 2013-06-27 (×2): 18.75 mg via ORAL
  Filled 2013-06-26 (×4): qty 1

## 2013-06-26 MED ORDER — ACETAMINOPHEN 325 MG PO TABS
325.0000 mg | ORAL_TABLET | ORAL | Status: DC | PRN
  Administered 2013-06-26 – 2013-06-27 (×3): 650 mg via ORAL
  Filled 2013-06-26 (×3): qty 2

## 2013-06-26 MED ORDER — FENTANYL CITRATE 0.05 MG/ML IJ SOLN
INTRAMUSCULAR | Status: AC
Start: 2013-06-26 — End: 2013-06-26
  Filled 2013-06-26: qty 2

## 2013-06-26 MED ORDER — FUROSEMIDE 20 MG PO TABS
20.0000 mg | ORAL_TABLET | Freq: Two times a day (BID) | ORAL | Status: DC
  Administered 2013-06-26 – 2013-06-27 (×2): 20 mg via ORAL
  Filled 2013-06-26 (×4): qty 1

## 2013-06-26 MED ORDER — DIGOXIN 250 MCG PO TABS
0.2500 mg | ORAL_TABLET | Freq: Every day | ORAL | Status: DC
  Administered 2013-06-26 – 2013-06-27 (×2): 0.25 mg via ORAL
  Filled 2013-06-26 (×2): qty 1

## 2013-06-26 MED ORDER — VALACYCLOVIR HCL 500 MG PO TABS: 1000.0000 mg | ORAL_TABLET | Freq: Two times a day (BID) | ORAL | Status: DC | PRN

## 2013-06-26 MED ORDER — COLCHICINE 0.6 MG PO TABS
0.6000 mg | ORAL_TABLET | Freq: Two times a day (BID) | ORAL | Status: DC
  Administered 2013-06-26 – 2013-06-27 (×3): 0.6 mg via ORAL
  Filled 2013-06-26 (×4): qty 1

## 2013-06-26 MED ORDER — EMTRICITABINE-TENOFOVIR DF 200-300 MG PO TABS
1.0000 | ORAL_TABLET | Freq: Every day | ORAL | Status: DC
  Administered 2013-06-26 – 2013-06-27 (×2): 1 via ORAL
  Filled 2013-06-26 (×2): qty 1

## 2013-06-26 MED ORDER — PANTOPRAZOLE SODIUM 40 MG PO TBEC: 40.0000 mg | DELAYED_RELEASE_TABLET | Freq: Every day | ORAL | Status: DC

## 2013-06-26 MED ORDER — CEFAZOLIN SODIUM-DEXTROSE 2-3 GM-% IV SOLR
2.0000 g | Freq: Four times a day (QID) | INTRAVENOUS | Status: AC
  Administered 2013-06-26 – 2013-06-27 (×3): 2 g via INTRAVENOUS
  Filled 2013-06-26 (×3): qty 50

## 2013-06-26 MED ORDER — DOLUTEGRAVIR SODIUM 50 MG PO TABS
50.0000 mg | ORAL_TABLET | Freq: Every day | ORAL | Status: DC
  Administered 2013-06-26 – 2013-06-27 (×2): 50 mg via ORAL
  Filled 2013-06-26 (×2): qty 1

## 2013-06-26 MED ORDER — LIDOCAINE HCL (PF) 1 % IJ SOLN
INTRAMUSCULAR | Status: AC
  Filled 2013-06-26: qty 60

## 2013-06-26 MED ORDER — RILPIVIRINE HCL 25 MG PO TABS
25.0000 mg | ORAL_TABLET | Freq: Every day | ORAL | Status: DC
  Administered 2013-06-26 – 2013-06-27 (×2): 25 mg via ORAL
  Filled 2013-06-26 (×2): qty 1

## 2013-06-26 MED ORDER — VALACYCLOVIR HCL 500 MG PO TABS
1000.0000 mg | ORAL_TABLET | Freq: Two times a day (BID) | ORAL | Status: DC | PRN
  Filled 2013-06-26: qty 2

## 2013-06-26 MED ORDER — MUPIROCIN 2 % EX OINT
TOPICAL_OINTMENT | Freq: Two times a day (BID) | CUTANEOUS | Status: DC
  Administered 2013-06-26: 07:00:00 via NASAL

## 2013-06-26 MED ORDER — MIDAZOLAM HCL 5 MG/5ML IJ SOLN
INTRAMUSCULAR | Status: AC
  Filled 2013-06-26: qty 5

## 2013-06-26 MED ORDER — ONDANSETRON HCL 4 MG/2ML IJ SOLN: 4.0000 mg | Freq: Four times a day (QID) | INTRAMUSCULAR | Status: DC | PRN

## 2013-06-26 MED ORDER — PANTOPRAZOLE SODIUM 40 MG PO TBEC: 40.0000 mg | DELAYED_RELEASE_TABLET | Freq: Every day | ORAL | Status: DC | PRN

## 2013-06-26 MED ORDER — LISINOPRIL 40 MG PO TABS
40.0000 mg | ORAL_TABLET | Freq: Every day | ORAL | Status: DC
  Administered 2013-06-26 – 2013-06-27 (×2): 40 mg via ORAL
  Filled 2013-06-26 (×2): qty 1

## 2013-06-26 MED ORDER — EMTRICITAB-RILPIVIR-TENOFOV DF 200-25-300 MG PO TABS: 1.0000 | ORAL_TABLET | Freq: Every day | ORAL | Status: DC

## 2013-06-26 NOTE — Interval H&P Note (Signed)
History and Physical Interval Note:  06/26/2013 7:35 AM  [name]  has presented today for surgery, with the diagnosis of Heart failure  The various methods of treatment have been discussed with the patient and family. After consideration of risks, benefits and other options for treatment, the patient has consented to  Procedure(s): BI-VENTRICULAR PACEMAKER INSERTION (CRT-P) (N/A) as a surgical intervention .  The patient's history has been reviewed, patient examined, no change in status, stable for surgery.  I have reviewed the patient's chart and labs.  Questions were answered to the patient's satisfaction.     Lewayne Bunting

## 2013-06-26 NOTE — Discharge Summary (Signed)
ELECTROPHYSIOLOGY PROCEDURE DISCHARGE SUMMARY    Patient ID: [name],  MRN: [id number], DOB/AGE: November 26, 1965 48 y.o.  Admit date: 06/26/2013 Discharge date: 06/26/2013  Primary Care Physician: David Righter, MD Primary Cardiologist: David Mango, MD Electrophysiologist: David Bunting, MD  Primary Discharge Diagnosis:  Non ischemic cardiomyopathy, congestive heart failure, and LBBB status post CRT-D implantation this admission  Secondary Discharge Diagnosis:  1.  Hypertension 2.  HIV infection 3.  Syncope with previous negative EPS 4.  GERD 5.  Prior substance abuse  Allergies  Allergen Reactions  . Bactrim Rash    Procedures This Admission:  1.  Implantation of a MDT CRT-D on 06/26/2013 by Dr [name].  The patient received a MDT model number Viva XT ICD with model number 5076 right atrial lead, 6935 right ventricular lead, and 4396 left ventricular lead.There were no immediate post procedure complications. 2.  CXR on 06/27/2013 demonstrated no pneumothorax status post device implantation.   Brief HPI: [name] is a 48 y.o. male was referred to electrophysiology in the outpatient setting for consideration of ICD implantation.  Past medical history includes non ischemic cardiomyopathy, congestive heart failure (chronic systolic) and left bundle branch block.  The patient has persistent LV dysfunction despite guideline directed therapy.  Risks, benefits, and alternatives to ICD implantation were reviewed with the patient who wished to proceed.   Hospital Course:  The patient was admitted and underwent implantation of a CRTDwith details as outlined above.   He was monitored on telemetry overnight which demonstrated sinus rhythm with ventricular pacing.  Left chest was without hematoma or ecchymosis.  The device was interrogated and found to be functioning normally.  CXR was obtained and demonstrated no pneumothorax status post device implantation.  Wound care, arm  mobility, and restrictions were reviewed with the patient.  Dr [name] examined the patient and considered them stable for discharge to home.   The patient's discharge medications include an ACE-I (Lisinopril) and beta blocker (Coreg).   Discharge Vitals: Blood pressure 133/100, pulse 97, temperature 98 F (36.7 C), temperature source Axillary, resp. rate 19, height 5' 4.5" (1.638 m), weight 158 lb (71.668 kg), SpO2 92.00%.   Labs:   Lab Results  Component Value Date   WBC 9.1 06/13/2013   HGB 14.4 06/13/2013   HCT 43.5 06/13/2013   MCV 86.8 06/13/2013   PLT 266.0 06/13/2013   No results found for this basename: NA, K, CL, CO2, BUN, CREATININE, CALCIUM, LABALBU, PROT, BILITOT, ALKPHOS, ALT, AST, GLUCOSE,  in the last 168 hours   Discharge Medications:    Medication List    ASK your doctor about these medications       carvedilol 12.5 MG tablet  Commonly known as:  COREG  Take 1.5 tablets (18.75 mg total) by mouth 2 (two) times daily with a meal.     colchicine 0.6 MG tablet  Take 1 tablet (0.6 mg total) by mouth 2 (two) times daily.     digoxin 0.25 MG tablet  Commonly known as:  LANOXIN  Take 1 tablet (0.25 mg total) by mouth daily.     Emtricitab-Rilpivir-Tenofovir 200-25-300 MG Tabs  Take 1 tablet by mouth daily with lunch.     furosemide 20 MG tablet  Commonly known as:  LASIX  Take 1 tablet (20 mg total) by mouth 2 (two) times daily.     lisinopril 40 MG tablet  Commonly known as:  PRINIVIL,ZESTRIL  Take 1 tablet (40 mg total) by mouth daily.  omeprazole 20 MG capsule  Commonly known as:  PRILOSEC  Take 20 mg by mouth daily as needed (heartburn).     TIVICAY 50 MG tablet  Generic drug:  dolutegravir  Take 50 mg by mouth daily with lunch.     valACYclovir 1000 MG tablet  Commonly known as:  VALTREX  Take 1,000 mg by mouth 2 (two) times daily as needed (outbreaks). Take for 5 days for an outbreak        Disposition:       Future Appointments Provider  Department Dept Phone   06/28/2013 11:20 AM [facility] [phone]       Duration of Discharge Encounter: less than 30 minutes including physician time.  Signed, [name]

## 2013-06-26 NOTE — Interval H&P Note (Signed)
History and Physical Interval Note:  06/26/2013 7:37 AM  [name]  has presented today for surgery, with the diagnosis of Heart failure  The various methods of treatment have been discussed with the patient and family. After consideration of risks, benefits and other options for treatment, the patient has consented to  BiV ICD as a surgical intervention .  The patient's history has been reviewed, patient examined, no change in status, stable for surgery.  I have reviewed the patient's chart and labs.  Questions were answered to the patient's satisfaction.     [name]

## 2013-06-26 NOTE — H&P (Signed)
  ICD Criteria  Current LVEF (within 6 months):20%  NYHA Functional Classification: Class II  Heart Failure History:  Yes, Duration of heart failure since onset is > 9 months  Non-Ischemic Dilated Cardiomyopathy History:  Yes, timeframe is > 9 months  Atrial Fibrillation/Atrial Flutter:  No.  Ventricular Tachycardia History:  No.  Cardiac Arrest History:  No  History of Syndromes with Risk of Sudden Death:  No.  Previous ICD:  No.  Electrophysiology Study: Yes, EP Study timeframe: > 6 months, No ventricular arrhythmias induced.   Prior MI: No.  PPM: No.  OSA:  No  Patient Life Expectancy of >=1 year: Yes.  Anticoagulation Therapy:  Patient is NOT on anticoagulation therapy.   Beta Blocker Therapy:  Yes.   Ace Inhibitor/ARB Therapy:  Yes.

## 2013-06-26 NOTE — CV Procedure (Signed)
BiV ICD implant via the left subclavian without immediate complication. U#[id number].

## 2013-06-26 NOTE — H&P (View-Only) (Signed)
HPI [name] returns today for followup. He has long-standing nonischemic cardiomyopathy and chronic class 1-2 systolic heart failure. He has remote history of syncope, which predates his development of left ventricular dysfunction. He underwent EP testing several months ago demonstrated no inducible ventricular arrhythmias. He has had worsening LV systolic function and his QRS duration has increased, now 155 ms with a lbbb/ivcd pattern. He has not had recurrent syncope but does continue to have chronic dizziness. He has class 2 CHF symptoms. Allergies  Allergen Reactions  . Bactrim Rash     Current Outpatient Prescriptions  Medication Sig Dispense Refill  . carvedilol (COREG) 12.5 MG tablet Take 1.5 tablets (18.75 mg total) by mouth 2 (two) times daily with a meal.  90 tablet  3  . colchicine 0.6 MG tablet Take 1 tablet (0.6 mg total) by mouth 2 (two) times daily.  30 tablet  2  . digoxin (LANOXIN) 0.25 MG tablet Take 1 tablet (0.25 mg total) by mouth daily.  30 tablet  2  . dolutegravir (TIVICAY) 50 MG tablet Take 1 tablet (50 mg total) by mouth daily before lunch.  30 tablet  11  . Emtricitab-Rilpivir-Tenofovir 200-25-300 MG TABS Take 1 tablet by mouth daily before lunch.  30 tablet  11  . furosemide (LASIX) 20 MG tablet Take 1 tablet (20 mg total) by mouth 2 (two) times daily.  30 tablet  6  . lisinopril (PRINIVIL,ZESTRIL) 40 MG tablet Take 1 tablet (40 mg total) by mouth daily.  30 tablet  11  . omeprazole (PRILOSEC) 20 MG capsule Take 1 capsule by mouth daily.      . valACYclovir (VALTREX) 1000 MG tablet Take 1,000 mg by mouth 2 (two) times daily as needed. Take for 5 days for an outbreak       No current facility-administered medications for this visit.     Past Medical History  Diagnosis Date  . Hypertension   . HIV infection   . Hepatitis     Hep B and Hep C (patient does not report this but these are listed in previous notes.)  . G6PD deficiency   . Chronic systolic heart  failure     a. Echo 12/05/11:  EF 20-25%, diff HK with mild sparing of IL wall, mild AI, mod MR, mild LAE, mild RVE, mild reduced RVF.;  b.  Echo 05/11/2012:  Mild LVH, EF 20-25%, Gr 1 diast dysfn, mod MR, mild LAE  . NICM (nonischemic cardiomyopathy)     cardia CTA 8/13 negative for obstructive CAD  . CHF (congestive heart failure)   . GERD (gastroesophageal reflux disease)   . Depression     ROS:   All systems reviewed and negative except as noted in the HPI.   Past Surgical History  Procedure Laterality Date  . Finger surgery      Thumb laceration.    . Esophagogastroduodenoscopy  12/07/2011    Procedure: ESOPHAGOGASTRODUODENOSCOPY (EGD);  Surgeon: [name], MD,FACG;  Location: [facility] ENDOSCOPY;  Service: Endoscopy;  Laterality: N/A;     Family History  Problem Relation Age of Onset  . Cancer Mother      History   Social History  . Marital Status: Single    Spouse Name: N/A    Number of Children: N/A  . Years of Education: N/A   Occupational History  . Unemployed    Social History Main Topics  . Smoking status: Never Smoker   . Smokeless tobacco: Never Used  . Alcohol Use:  0.6 oz/week    1 Cans of beer per week     Comment: cutting back - knows he cannot. "drink one to get rid of the urge"/ quit 1 month ago  . Drug Use: No     Comment: marijuana daily  . Sexual Activity: Yes    Partners: Male     Comment: 28 year relationship, declined condoms   Other Topics Concern  . Not on file   Social History Narrative   Lives alone.  Drinks beer daily.  Liquor rarely.  He occasionally smokes marijuana..     BP 123/95  Pulse 80  Ht 5\' 4"  (1.626 m)  Wt 151 lb 8 oz (68.72 kg)  BMI 25.99 kg/m2  Physical Exam:  Well appearing middle-aged man, NAD HEENT: Unremarkable Neck:  7 cm JVD, no thyromegally Back:  No CVA tenderness Lungs:  Clear with no wheezes, rales, or rhonchi. HEART:  Regular rate rhythm, no murmurs, no rubs, no clicks, split S2 Abd:  soft,  positive bowel sounds, no organomegally, no rebound, no guarding Ext:  2 plus pulses, no edema, no cyanosis, no clubbing Skin:  No rashes no nodules Neuro:  CN II through XII intact, motor grossly intact  EKG - normal sinus rhythm with left bundle branch block, QRS duration 155   Assess/Plan:

## 2013-06-27 ENCOUNTER — Ambulatory Visit (HOSPITAL_COMMUNITY): Payer: Medicare Other

## 2013-06-27 DIAGNOSIS — I428 Other cardiomyopathies: Secondary | ICD-10-CM

## 2013-06-27 DIAGNOSIS — I509 Heart failure, unspecified: Secondary | ICD-10-CM | POA: Diagnosis not present

## 2013-06-27 DIAGNOSIS — Z95818 Presence of other cardiac implants and grafts: Secondary | ICD-10-CM | POA: Diagnosis not present

## 2013-06-27 DIAGNOSIS — I447 Left bundle-branch block, unspecified: Secondary | ICD-10-CM | POA: Diagnosis not present

## 2013-06-27 DIAGNOSIS — I5022 Chronic systolic (congestive) heart failure: Secondary | ICD-10-CM | POA: Diagnosis not present

## 2013-06-27 MED ORDER — CARVEDILOL 12.5 MG PO TABS: 18.7500 mg | ORAL_TABLET | Freq: Two times a day (BID) | ORAL | Status: DC

## 2013-06-27 MED ORDER — OXYCODONE-ACETAMINOPHEN 5-325 MG PO TABS
1.0000 | ORAL_TABLET | ORAL | Status: DC | PRN
  Administered 2013-06-27: 1 via ORAL
  Filled 2013-06-27: qty 1

## 2013-06-27 MED ORDER — HYDROCODONE-ACETAMINOPHEN 5-325 MG PO TABS: 1.0000 | ORAL_TABLET | Freq: Four times a day (QID) | ORAL | Status: DC | PRN

## 2013-06-27 MED ORDER — RANITIDINE HCL 150 MG PO TABS: 150.0000 mg | ORAL_TABLET | Freq: Every day | ORAL | Status: DC | PRN

## 2013-06-27 NOTE — Progress Notes (Signed)
IV and tele discontinued, discharged education, medication, and paperwork reviewed with patient and family, patient and family stated understanding, prescription given to patient Archie Balboa, RN

## 2013-06-27 NOTE — Discharge Instructions (Signed)
° ° °  Supplemental Discharge Instructions for  Pacemaker/Defibrillator Patients  Activity No heavy lifting or vigorous activity with your left/right arm for 6 to 8 weeks.  Do not raise your left/right arm above your head for one week.  Gradually raise your affected arm as drawn below.           06/29/13                            06/30/13                    07/01/13                  07/02/13  NO DRIVING for 1 week     WOUND CARE   Keep the wound area clean and dry.  Do not get this area wet for one week. No showers for one week; you may shower on 07/04/13   .   The tape/steri-strips on your wound will fall off; do not pull them off.  No bandage is needed on the site.  DO  NOT apply any creams, oils, or ointments to the wound area.   If you notice any drainage or discharge from the wound, any swelling or bruising at the site, or you develop a fever > 101? F after you are discharged home, call the office at once.  Special Instructions   You are still able to use cellular telephones; use the ear opposite the side where you have your pacemaker/defibrillator.  Avoid carrying your cellular phone near your device.   When traveling through airports, show security personnel your identification card to avoid being screened in the metal detectors.  Ask the security personnel to use the hand wand.   Avoid arc welding equipment, MRI testing (magnetic resonance imaging), TENS units (transcutaneous nerve stimulators).  Call the office for questions about other devices.   Avoid electrical appliances that are in poor condition or are not properly grounded.   Microwave ovens are safe to be near or to operate.  Additional information for defibrillator patients should your device go off:   If your device goes off ONCE and you feel fine afterward, notify the device clinic nurses.   If your device goes off ONCE and you do not feel well afterward, call 911.   If your device goes off TWICE, call 911.   If your device  goes off THREE times in one day, call 911.  DO NOT DRIVE YOURSELF OR A FAMILY MEMBER WITH A DEFIBRILLATOR TO THE HOSPITAL--CALL 911.

## 2013-06-27 NOTE — Progress Notes (Addendum)
Tubed rx to 2000 for handwritten Norco RX - 5/325mg  1 tab po q6 PRN mod-severe pain disp #10 zero RF. (Rx authorized by EP team to give pt post-implant.) Porter Moes PA-C

## 2013-06-27 NOTE — Op Note (Signed)
NAMTalbert Nan:  Rollins, David              ACCOUNT NO.:  [id number]  MEDICAL RECORD NO.:  [id number]  LOCATION:  2W10C                        FACILITY:  [facility]  PHYSICIAN:  Doylene CanningGregg W. Ladona Ridgelaylor, MD    DATE OF BIRTH:  [birth year]  DATE OF PROCEDURE:  06/26/2013 DATE OF DISCHARGE:                              OPERATIVE REPORT   PROCEDURE PERFORMED:  Insertion of a biventricular ICD with contrast venography and defibrillation threshold testing.  INTRODUCTION:  The patient is a 48 year old man with a longstanding nonischemic cardiomyopathy and chronic systolic heart failure.  He has a history of syncope.  Previously,his ejection fraction was 35% to 40%, but over the last year he has had a reduction in his left ventricular function down to 20%.  During this time, he has developed left bundle-branch block with his QRS duration having gone from 135 milliseconds to 155 milliseconds.  His findings have all occurred despite maximal medical therapy with beta-blocker and ACE inhibitors.  The patient has a remote history of noncompliance, but has not had that in the last year.  He is now referred for insertion of a biventricular ICD for primary prevention of malignant ventricular arrhythmias and for heart failure treatment.  PROCEDURE:  After informed consent was obtained, the patient was taken to the diagnostic EP lab in a fasting state.  After usual preparation and draping, intravenous fentanyl and midazolam was given for sedation. 30 mL of lidocaine was infiltrated into the left infraclavicular region. A 7-cm incision was carried out over this region and electrocautery was utilized to dissect down to the fascial plane.  The subclavian vein was initially punctured without difficulty and a J-wire advanced.  Attempt to puncture the subclavian vein on subsequent attempts was unsuccessful. Venography of the left subclavian vein was then carried out demonstrating that the vein had spasms and at this point the vein  was punctured more proximally and the Glidewire was utilized along with a second puncture being carried out with a Wholey wire to get 3 wires into the subclavian vein, and down into the right atrium.  The right ventricular lead, which was the Medtronic 6935 65 cm lead, serial number [id number] was advanced into the right ventricle and the Medtronic model 5076, 52 cm active fixation pacing lead, serial number [id number] was advanced to the right atrium.  Mapping was first carried out in the right ventricle at the final site on the RV apical septum, the R-waves measured 8-9 mV.  With the lead actively fixed, the pacing impedance was 678 ohms and the threshold was 0.4 V at 0.5 milliseconds.  10 V pacing did not stimulate the diaphragm.  With the right ventricular lead in satisfactory position, attention was then turned to placement of the atrial lead, which was placed in the anterolateral portion of the right atrium where the P-waves measured 2 mV.  The pacing impedance was 550 ohms and the threshold was 0.6 V at 0.5 milliseconds with the lead actively fixed.  There was a large injury current.  10 V pacing did not stimulate the diaphragm.  With both the atrial and the RV leads in satisfactory position, attention was then turned to placement of the left ventricular lead.  The extended hook CS guiding catheter along with a 6-French hexapolar EP catheter was advanced into the right atrium. The coronary sinus was cannulated with a moderate amount of difficulty. Venography of the coronary sinus was carried out.  This demonstrated a very large posterior vein, which traversed all the way to the apex.  It also demonstrated a smaller lateral vein, and an anterior vein.  From the perspective of lead separation, the lateral vein was thought to be the best lead for LV lead placement.  The initial Medtronic quadripolar LV lead was utilized.  An angioplasty guidewire was utilized to cannulate the lateral  vein, but the wire could only go approximately halfway from base to apex before the vein ran out.  The quadripolar LV lead was then advanced over the wire, but could only get a distance 1/3rd from base to apex.  The proximal coil of the lead was in the coronary sinus.  It was deemed that the likelihood of dislodgement of this lead was unacceptably high and the lead was withdrawn.  The guiding catheter was then withdrawn into the more proximal portion of the coronary sinus, and a much larger posterior vein was cannulated and the angioplasty guidewire was advanced into the posterior vein.  This was a very large vein and placement of the LV lead into this vein demonstrated unacceptably high pacing thresholds of over 3 V.  We tried to lead in the subselective branch, but this was unsuccessful because subselective branches were quite small.  We tried to prolapse the lead, but this was also unsuccessful.  At this point, it was deemed most appropriate to attempt to go back to the lateral vein with a smaller LV pacing lead. This was accomplished without particular difficulty.  The Medtronic bipolar pacing lead was advanced under fluoroscopic guidance over the angioplasty guidewire into the lateral vein.  It was placed approximately halfway from base to apex and in this location, the pacing threshold tip to RV coil was quite satisfactory less than a V at 0.5 milliseconds, and there was no diaphragmatic stimulation.  The pacing impedance was 460 ohms.  With these satisfactory parameters, the left ventricular lead was freed up from its guiding catheter in the usual manner, and secured to the fascial plane with silk suture.  This was also accomplished with RV in the atrial lead.  The sewing sleeves were also secured with silk suture.  At this point, electrocautery was utilized to make a subcutaneous pocket.  Antibiotic irrigation was utilized to irrigate the pocket and electrocautery was utilized  to assure hemostasis.  The Medtronic VIVA XT CRT-D biventricular ICD, serial number [id number] was connected to the RV, LV, and atrial leads and placed back in the subcutaneous pocket.  The pocket was irrigated with antibiotic irrigation and the incision was closed with 2-0 and 3-0 Vicryl suture.  At this point, I scrubbed out of the case and supervised defibrillation threshold testing.  After the patient was more deeply sedated under my direct supervision with intravenous fentanyl and Versed, ventricular fibrillation was induced with a T-wave shock.  A 20-joule shock was then delivered, which terminated ventricular fibrillation and restored sinus rhythm.  No additional defibrillation threshold testing was carried out, and the patient was returned to his room in satisfactory condition.  COMPLICATIONS:  There were no immediate procedure complications.  RESULTS:  This demonstrates successful insertion of a Medtronic biventricular ICD in a patient with chronic class II systolic heart failure, ejection fraction 20%, despite maximal medical therapy  with ACE inhibitors and beta-blockers in the setting of an ejection fraction of 40% and left bundle-branch block with a QRS duration of 155 milliseconds.     Doylene Canning. Ladona Ridgel, MD     GWT/MEDQ  D:  06/26/2013  T:  06/27/2013  Job:  [id number]  cc:   Bevelyn Buckles. Bensimhon, MD

## 2013-06-27 NOTE — Progress Notes (Signed)
Came to visit patient at bedside. However, he was discharged prior to visit. He is active with [facility] Care Management services.  Raiford Noble, MSN- RN,BSN- [facility] [phone]

## 2013-06-28 ENCOUNTER — Encounter (HOSPITAL_COMMUNITY): Payer: Medicare Other

## 2013-07-05 ENCOUNTER — Ambulatory Visit: Payer: Medicare Other

## 2013-07-07 ENCOUNTER — Ambulatory Visit (INDEPENDENT_AMBULATORY_CARE_PROVIDER_SITE_OTHER): Payer: Medicare Other | Admitting: *Deleted

## 2013-07-07 DIAGNOSIS — I428 Other cardiomyopathies: Secondary | ICD-10-CM | POA: Diagnosis not present

## 2013-07-07 DIAGNOSIS — I4729 Other ventricular tachycardia: Secondary | ICD-10-CM | POA: Diagnosis not present

## 2013-07-07 DIAGNOSIS — I429 Cardiomyopathy, unspecified: Secondary | ICD-10-CM

## 2013-07-07 DIAGNOSIS — I472 Ventricular tachycardia: Secondary | ICD-10-CM

## 2013-07-07 DIAGNOSIS — I5022 Chronic systolic (congestive) heart failure: Secondary | ICD-10-CM

## 2013-07-07 DIAGNOSIS — R000 Tachycardia, unspecified: Secondary | ICD-10-CM

## 2013-07-07 LAB — MDC_IDC_ENUM_SESS_TYPE_INCLINIC
Battery Voltage: 3.12 V
Brady Statistic AP VP Percent: 0.1 %
Brady Statistic AP VS Percent: 0.01 %
Brady Statistic AS VP Percent: 98.46 %
Brady Statistic AS VS Percent: 1.42 %
Brady Statistic RA Percent Paced: 0.11 %
Brady Statistic RV Percent Paced: 4.01 %
HIGH POWER IMPEDANCE MEASURED VALUE: 70 Ohm
HighPow Impedance: 228 Ohm
Lead Channel Impedance Value: 399 Ohm
Lead Channel Impedance Value: 456 Ohm
Lead Channel Impedance Value: 646 Ohm
Lead Channel Pacing Threshold Amplitude: 0.75 V
Lead Channel Pacing Threshold Pulse Width: 0.4 ms
Lead Channel Pacing Threshold Pulse Width: 0.4 ms
Lead Channel Sensing Intrinsic Amplitude: 1.875 mV
Lead Channel Sensing Intrinsic Amplitude: 14.625 mV
Lead Channel Sensing Intrinsic Amplitude: 2 mV
Lead Channel Setting Pacing Amplitude: 2.75 V
Lead Channel Setting Pacing Amplitude: 3.5 V
Lead Channel Setting Pacing Amplitude: 3.5 V
Lead Channel Setting Pacing Pulse Width: 0.4 ms
MDC IDC MSMT BATTERY REMAINING LONGEVITY: 93 mo
MDC IDC MSMT LEADCHNL LV PACING THRESHOLD AMPLITUDE: 1.125 V
MDC IDC MSMT LEADCHNL LV PACING THRESHOLD PULSEWIDTH: 0.4 ms
MDC IDC MSMT LEADCHNL RA IMPEDANCE VALUE: 456 Ohm
MDC IDC MSMT LEADCHNL RV IMPEDANCE VALUE: 551 Ohm
MDC IDC MSMT LEADCHNL RV PACING THRESHOLD AMPLITUDE: 0.625 V
MDC IDC MSMT LEADCHNL RV SENSING INTR AMPL: 10.25 mV
MDC IDC SESS DTM: 20150206104803
MDC IDC SET LEADCHNL LV PACING PULSEWIDTH: 0.4 ms
MDC IDC SET LEADCHNL RV SENSING SENSITIVITY: 0.3 mV
Zone Setting Detection Interval: 300 ms
Zone Setting Detection Interval: 350 ms
Zone Setting Detection Interval: 350 ms
Zone Setting Detection Interval: 360 ms

## 2013-07-07 NOTE — Progress Notes (Signed)
Wound check appointment. Steri-strips removed. Wound without redness or edema. Incision edges approximated, wound well healed. Removed 1 suture. Pt instructed to call if he notices redness, inflamation, or swelling. Normal device function. Thresholds, sensing, and impedances consistent with implant measurements. Device programmed at 3.5V (2.75 for LV) for extra safety margin until 3 month visit. Histogram distribution appropriate for patient and level of activity. No mode switches or ventricular arrhythmias noted. Patient educated about wound care, arm mobility, lifting restrictions, shock plan.   ROV w/ Dr. Ladona Ridgel 10/05/13 @ 9:30.

## 2013-07-11 ENCOUNTER — Ambulatory Visit (HOSPITAL_COMMUNITY)
Admission: RE | Admit: 2013-07-11 | Discharge: 2013-07-11 | Disposition: A | Discharge status: Home / Self Care | Payer: Medicare Other | Source: Ambulatory Visit | Attending: Internal Medicine | Admitting: Internal Medicine

## 2013-07-11 ENCOUNTER — Encounter (HOSPITAL_COMMUNITY): Payer: Self-pay

## 2013-07-11 VITALS — BP 164/88 | HR 91 | Resp 18 | Wt 153.5 lb

## 2013-07-11 DIAGNOSIS — F1010 Alcohol abuse, uncomplicated: Secondary | ICD-10-CM | POA: Insufficient documentation

## 2013-07-11 DIAGNOSIS — F329 Major depressive disorder, single episode, unspecified: Secondary | ICD-10-CM | POA: Insufficient documentation

## 2013-07-11 DIAGNOSIS — F1910 Other psychoactive substance abuse, uncomplicated: Secondary | ICD-10-CM

## 2013-07-11 DIAGNOSIS — I428 Other cardiomyopathies: Secondary | ICD-10-CM | POA: Insufficient documentation

## 2013-07-11 DIAGNOSIS — F3289 Other specified depressive episodes: Secondary | ICD-10-CM | POA: Insufficient documentation

## 2013-07-11 DIAGNOSIS — B1920 Unspecified viral hepatitis C without hepatic coma: Secondary | ICD-10-CM | POA: Insufficient documentation

## 2013-07-11 DIAGNOSIS — Z79899 Other long term (current) drug therapy: Secondary | ICD-10-CM | POA: Insufficient documentation

## 2013-07-11 DIAGNOSIS — I5022 Chronic systolic (congestive) heart failure: Secondary | ICD-10-CM | POA: Insufficient documentation

## 2013-07-11 DIAGNOSIS — Z21 Asymptomatic human immunodeficiency virus [HIV] infection status: Secondary | ICD-10-CM | POA: Insufficient documentation

## 2013-07-11 DIAGNOSIS — I10 Essential (primary) hypertension: Secondary | ICD-10-CM | POA: Insufficient documentation

## 2013-07-11 DIAGNOSIS — I509 Heart failure, unspecified: Secondary | ICD-10-CM | POA: Insufficient documentation

## 2013-07-11 DIAGNOSIS — K219 Gastro-esophageal reflux disease without esophagitis: Secondary | ICD-10-CM | POA: Insufficient documentation

## 2013-07-11 LAB — BASIC METABOLIC PANEL
BUN: 9 mg/dL (ref 6–23)
CHLORIDE: 111 meq/L (ref 96–112)
CO2: 21 mEq/L (ref 19–32)
Calcium: 9.3 mg/dL (ref 8.4–10.5)
Creatinine, Ser: 0.88 mg/dL (ref 0.50–1.35)
GFR calc Af Amer: >90 mL/min (ref >90)
GFR calc non Af Amer: >90 mL/min (ref >90)
Glucose, Bld: 94 mg/dL (ref 70–99)
Potassium: 4.2 mEq/L (ref 3.7–5.3)
SODIUM: 146 meq/L (ref 137–147)

## 2013-07-11 MED ORDER — CARVEDILOL 25 MG PO TABS: 25.0000 mg | ORAL_TABLET | Freq: Two times a day (BID) | ORAL | Status: DC

## 2013-07-11 NOTE — Patient Instructions (Signed)
Increase your coreg to 1 1/2 tablets (18.75 mg) in the morning and 25 mg (2 tablets) in the evening for 1 week, if you tolerate this and do not feel dizzy then the next week go to 2 tablets (25 mg) in the morning and 2 tablets (25 mg) in the evening. When you run out call us and we will send in new prescription for a 25 mg tablet and then you will take 1 tablet in the AM and 1 tablet in the PM.  Doing great!!!!  F/U 8 weeks  Do the following things EVERYDAY: 1) Weigh yourself in the morning before breakfast. Write it down and keep it in a log. 2) Take your medicines as prescribed 3) Eat low salt foods-Limit salt (sodium) to 2000 mg per day.  4) Stay as active as you can everyday 5) Limit all fluids for the day to less than 2 liters 6)

## 2013-07-11 NOTE — Progress Notes (Signed)
Patient ID: [name], male   DOB: [birth year], 48 y.o.   MRN: [id number] ---Medtronic Optivol ----   Referring Physician: Dr. Jens Som Primary Physician: [name], MD  Primary Cardiologist: GT    HPI: [name] is a 48 y.o. male with a history of HIV, HCV, ETOH abuse and CHF due to NICM s/p CRT-D (06/2013). He also has a h/o syncope and previously had an EP study which was non-inducible for VT.    Was recently admitted 01/19/13- 01/22/13 for acute on chronic systolic HF. ECHO EF 20-25%. He was diuresed with IV lasix. He was discharged on coreg 6.25 mg BID, digoxin 0.25 mg daily, lisinopril 40 mg daily, and spironolactone 25 mg daily. Discharge weight 150 lbs.   ECHO 04/2013: EF 20-25%, mod/severe MR, LA mod dilated  Follow up: Since last visit CRT-D was placed 06/26/13. Doing well and reports he no longer is fatigued. Last visit increased coreg to 18.75 mg BID and tolerated well. Denies SOB, orthopnea or CP. Taking medications as prescribed. Following low salt diet and drinking less than 2L a day. Weight at home 150-153lbs. Very rarely has an occasional drink. Following with Dr. Luciana Axe. Has not taken medications yet today.   ** Not on Spiro d/t extreme headaches**  SH: Lives with partner in [place]  ROS: All systems negative except as listed in HPI, PMH and Problem List.  Past Medical History  Diagnosis Date  . Hypertension   . HIV infection   . Hepatitis     Hep B and Hep C (patient does not report this but these are listed in previous notes.)  . G6PD deficiency   . Chronic systolic heart failure     a. Echo 12/05/11:  EF 20-25%, diff HK with mild sparing of IL wall, mild AI, mod MR, mild LAE, mild RVE, mild reduced RVF.;  b.  Echo 05/11/2012:  Mild LVH, EF 20-25%, Gr 1 diast dysfn, mod MR, mild LAE  . NICM (nonischemic cardiomyopathy)     cardia CTA 8/13 negative for obstructive CAD  . CHF (congestive heart failure)   . GERD (gastroesophageal reflux disease)   .  Depression     Current Outpatient Prescriptions  Medication Sig Dispense Refill  . carvedilol (COREG) 12.5 MG tablet Take 1.5 tablets (18.75 mg total) by mouth 2 (two) times daily with a meal.      . colchicine 0.6 MG tablet Take 1 tablet (0.6 mg total) by mouth 2 (two) times daily.  30 tablet  2  . digoxin (LANOXIN) 0.25 MG tablet Take 1 tablet (0.25 mg total) by mouth daily.  30 tablet  6  . dolutegravir (TIVICAY) 50 MG tablet Take 50 mg by mouth daily with lunch.      . Emtricitab-Rilpivir-Tenofovir 200-25-300 MG TABS Take 1 tablet by mouth daily with lunch.      . emtricitabine-tenofovir (TRUVADA) 200-300 MG per tablet Take 1 tablet by mouth daily. Combo 3-in-1 tablet w/ Rilpivir 25mg . Take 1 tablet daily w/ lunch      . furosemide (LASIX) 20 MG tablet Take 1 tablet (20 mg total) by mouth 2 (two) times daily.  30 tablet  6  . HYDROcodone-acetaminophen (NORCO) 5-325 MG per tablet Take 1 tablet by mouth every 6 (six) hours as needed for moderate pain or severe pain.      Marland Kitchen lisinopril (PRINIVIL,ZESTRIL) 40 MG tablet Take 1 tablet (40 mg total) by mouth daily.  30 tablet  11  . ranitidine (ZANTAC) 150 MG  tablet Take 1 tablet (150 mg total) by mouth daily as needed for heartburn. Take 12 hours prior to Complera or 4 hours after.  30 tablet  1  . rilpivirine (EDURANT) 25 MG TABS tablet Take 25 mg by mouth daily with breakfast. Combo 3-in-1 tablet w/ emtricitab-tenofovir 200-300mg . Take 1 tablet daily w/ lunch.      . valACYclovir (VALTREX) 1000 MG tablet Take 1,000 mg by mouth 2 (two) times daily as needed (outbreaks). Take for 5 days for an outbreak       No current facility-administered medications for this encounter.    Filed Vitals:   07/11/13 1044  BP: 164/88  Pulse: 91  Resp: 18  Weight: 153 lb 8 oz (69.627 kg)  SpO2: 98%    Physical Exam:  General: Well appearing, NAD.  HEENT: normal. + multiple earrings/studs  Neck: supple. JVD flat. Carotids 2+ bilat; no bruits. No  lymphadenopathy or thryomegaly appreciated.  Cor: PMI laterally displaced. RRR, 2/6 MR  Lungs: CTA Abdomen: soft, nontender, distended. No hepatosplenomegaly. No bruits or masses. Good bowel sounds.  Extremities: no cyanosis, clubbing, rash, edema  Neuro: alert & oriented x 3, cranial nerves grossly intact. moves all 4 extremities w/o difficulty. Affect pleasant.  ASSESSMENT & PLAN:  1) Chronic systolic HF:, NICM likely r/t ETOH abuse, EF 20-25% (04/2013) s/p CRT-D (06/2013) - NYHA I symptoms and volume status stable. Will continue lasix 20 mg BID. - Recently had CRT-D placed and reports he feels much better since implant. Will plan to repeat ECHO in 3 months. - Will increase coreg to 18.75 mg q am and 25 mg q pm for a week and instructed the patient if he feels well with increase and has no dizziness then to increase the following week to 25 mg BID. Patient is aware. - On max dose lisinopril 40 mg daily. He is not on Spiro d/t headaches. - SBP elevated, however did not take medications today. Discussed starting hydralazine and nitrates, however he would prefer to hold off since he can't remember to take a medication TID. - Reinforced the need and importance of daily weights, a low sodium diet, and fluid restriction (less than 2 L a day). Instructed to call the HF clinic if weight increases more than 3 lbs overnight or 5 lbs in a week. 2) ETOH abuse - Only drinks an occassional glass of wine on Sundays. Congratulated him on cutting back so extensively. Told to be careful and not increase back to old ways. 3) HTN - SBP elevated, however did not take medications today. Will increase BB.  F/U 8 weeks Ulla Potash B, NP-C 11:19 AM

## 2013-08-01 ENCOUNTER — Encounter: Payer: Self-pay | Admitting: Internal Medicine

## 2013-08-11 ENCOUNTER — Ambulatory Visit (INDEPENDENT_AMBULATORY_CARE_PROVIDER_SITE_OTHER): Payer: Medicare Other | Admitting: Internal Medicine

## 2013-08-11 ENCOUNTER — Telehealth: Payer: Self-pay | Admitting: Internal Medicine

## 2013-08-11 ENCOUNTER — Encounter: Payer: Self-pay | Admitting: Internal Medicine

## 2013-08-11 VITALS — BP 143/106 | HR 101 | Ht 64.5 in | Wt 153.0 lb

## 2013-08-11 DIAGNOSIS — T8140XA Infection following a procedure, unspecified, initial encounter: Secondary | ICD-10-CM | POA: Diagnosis not present

## 2013-08-11 DIAGNOSIS — T8141XA Infection following a procedure, superficial incisional surgical site, initial encounter: Secondary | ICD-10-CM | POA: Insufficient documentation

## 2013-08-11 DIAGNOSIS — T8149XA Infection following a procedure, other surgical site, initial encounter: Principal | ICD-10-CM

## 2013-08-11 NOTE — Progress Notes (Signed)
HPI [name] returns today for followup. He is a pleasant 48 yo man with a h/o a non-ischemic CM, chronic systolic heart failure, who is s/p ICD implant. He notes drainage from his ICD site. He denies fever and chills.  Allergies  Allergen Reactions  . Bactrim Rash     Current Outpatient Prescriptions  Medication Sig Dispense Refill  . carvedilol (COREG) 12.5 MG tablet Take 12.5 mg by mouth 2 (two) times daily with a meal.      . colchicine 0.6 MG tablet Take 1 tablet (0.6 mg total) by mouth 2 (two) times daily.  30 tablet  2  . digoxin (LANOXIN) 0.25 MG tablet Take 1 tablet (0.25 mg total) by mouth daily.  30 tablet  6  . dolutegravir (TIVICAY) 50 MG tablet Take 50 mg by mouth daily with lunch.      . Emtricitab-Rilpivir-Tenofovir 200-25-300 MG TABS Take 1 tablet by mouth daily with lunch.      . furosemide (LASIX) 20 MG tablet Take 1 tablet (20 mg total) by mouth 2 (two) times daily.  30 tablet  6  . HYDROcodone-acetaminophen (NORCO) 5-325 MG per tablet Take 1 tablet by mouth every 6 (six) hours as needed for moderate pain or severe pain.      Marland Kitchen lisinopril (PRINIVIL,ZESTRIL) 40 MG tablet Take 1 tablet (40 mg total) by mouth daily.  30 tablet  11  . ranitidine (ZANTAC) 150 MG tablet Take 1 tablet (150 mg total) by mouth daily as needed for heartburn. Take 12 hours prior to Complera or 4 hours after.  30 tablet  1  . valACYclovir (VALTREX) 1000 MG tablet Take 1,000 mg by mouth 2 (two) times daily as needed (outbreaks). Take for 5 days for an outbreak       No current facility-administered medications for this visit.     Past Medical History  Diagnosis Date  . Hypertension   . HIV infection   . Hepatitis     Hep B and Hep C (patient does not report this but these are listed in previous notes.)  . G6PD deficiency   . Chronic systolic heart failure     a. Echo 12/05/11:  EF 20-25%, diff HK with mild sparing of IL wall, mild AI, mod MR, mild LAE, mild RVE, mild reduced RVF.;   b.  Echo 05/11/2012:  Mild LVH, EF 20-25%, Gr 1 diast dysfn, mod MR, mild LAE  . NICM (nonischemic cardiomyopathy)     cardia CTA 8/13 negative for obstructive CAD  . CHF (congestive heart failure)   . GERD (gastroesophageal reflux disease)   . Depression     ROS:   All systems reviewed and negative except as noted in the HPI.   Past Surgical History  Procedure Laterality Date  . Finger surgery      Thumb laceration.    . Esophagogastroduodenoscopy  12/07/2011    Procedure: ESOPHAGOGASTRODUODENOSCOPY (EGD);  Surgeon: [name], MD,FACG;  Location: [facility] ENDOSCOPY;  Service: Endoscopy;  Laterality: N/A;     Family History  Problem Relation Age of Onset  . Cancer Mother      History   Social History  . Marital Status: Single    Spouse Name: N/A    Number of Children: N/A  . Years of Education: N/A   Occupational History  . Unemployed    Social History Main Topics  . Smoking status: Never Smoker   . Smokeless tobacco: Never Used  . Alcohol  Use: 0.6 oz/week    1 Cans of beer per week     Comment: cutting back - knows he cannot. "drink one to get rid of the urge"/ quit 1 month ago  . Drug Use: No     Comment: marijuana daily  . Sexual Activity: Yes    Partners: Male     Comment: 28 year relationship, declined condoms   Other Topics Concern  . Not on file   Social History Narrative   Lives alone.  Drinks beer daily.  Liquor rarely.  He occasionally smokes marijuana..     BP 143/106  Pulse 101  Ht 5' 4.5" (1.638 m)  Wt 153 lb (69.4 kg)  BMI 25.87 kg/m2  Physical Exam:  Well appearing middle aged man, NAD HEENT: Unremarkable Neck:  No JVD, no thyromegally Back:  No CVA tenderness Lungs:  Clear with no wheezes. ICD incision with a stitch abscess on the lateral incision border. HEART:  Regular rate rhythm, no murmurs, no rubs, no clicks Abd:  soft, positive bowel sounds, no organomegally, no rebound, no guarding Ext:  2 plus pulses, no edema, no  cyanosis, no clubbing Skin:  No rashes no nodules Neuro:  CN II through XII intact, motor grossly intact   Assess/Plan:

## 2013-08-11 NOTE — Assessment & Plan Note (Signed)
I have debrided the tissue on the lateral incision. The underlying tissue looks good. A bandage was placed. She will return for recheck in a week.

## 2013-08-11 NOTE — Telephone Encounter (Signed)
Returned call to patient he stated on corner of left side of ICD incision draining pus,red and very sore for the past week.Spoke to DOD Dr.Taylor he advised patient needs to be seen this afternoon.Appointment scheduled with Dr.Taylor today at 3:15 pm.

## 2013-08-11 NOTE — Patient Instructions (Signed)
Your next appointment is scheduled for 08-18-2013 @ 2:00pm with the device clinic for a wound recheck.

## 2013-08-11 NOTE — Telephone Encounter (Signed)
Patient called in to say he had a defibrillator put in 07/2013. He raised his shirt today and seen pus at the wound site. He felt that a stitch was infected. Sent to the call to [facility] in Triage.

## 2013-08-18 ENCOUNTER — Ambulatory Visit (INDEPENDENT_AMBULATORY_CARE_PROVIDER_SITE_OTHER): Payer: Medicare Other | Admitting: *Deleted

## 2013-08-18 DIAGNOSIS — I5022 Chronic systolic (congestive) heart failure: Secondary | ICD-10-CM

## 2013-08-18 NOTE — Progress Notes (Signed)
Wound recheck for stitch abscess.  No redness or edema noted.  Site well healed.  Follow up as scheduled.

## 2013-08-21 ENCOUNTER — Ambulatory Visit: Payer: Medicare Other

## 2013-08-29 ENCOUNTER — Telehealth (HOSPITAL_COMMUNITY): Payer: Self-pay | Admitting: Cardiology

## 2013-08-29 ENCOUNTER — Other Ambulatory Visit: Payer: Medicare Other

## 2013-08-29 DIAGNOSIS — B20 Human immunodeficiency virus [HIV] disease: Secondary | ICD-10-CM | POA: Diagnosis not present

## 2013-08-29 NOTE — Telephone Encounter (Signed)
Per VO Amy Clegg, NP Pt should increase Lasix 40 mg BiD until follow up Keep appt as scheduled

## 2013-08-29 NOTE — Telephone Encounter (Signed)
Pt called with concerns of increased SOB and weakness Pt states since he has been having increased SOB he has already increased his lasix to two in the AM However SOB really has him concerned  Pt given next available 4/2 @ 10 AM Please advise further if needed

## 2013-08-30 LAB — T-HELPER CELL (CD4) - (RCID CLINIC ONLY)
CD4 T CELL HELPER: 24 % — AB (ref 33–55)
CD4 T Cell Abs: 500 /uL (ref 400–2700)

## 2013-08-30 LAB — HIV-1 RNA QUANT-NO REFLEX-BLD

## 2013-08-31 ENCOUNTER — Ambulatory Visit (HOSPITAL_COMMUNITY)
Admission: RE | Admit: 2013-08-31 | Discharge: 2013-08-31 | Disposition: A | Discharge status: Home / Self Care | Payer: Medicare Other | Source: Ambulatory Visit | Attending: Internal Medicine | Admitting: Internal Medicine

## 2013-08-31 VITALS — BP 124/76 | HR 80 | Wt 152.8 lb

## 2013-08-31 DIAGNOSIS — I5022 Chronic systolic (congestive) heart failure: Secondary | ICD-10-CM

## 2013-08-31 DIAGNOSIS — F1010 Alcohol abuse, uncomplicated: Secondary | ICD-10-CM | POA: Insufficient documentation

## 2013-08-31 DIAGNOSIS — I10 Essential (primary) hypertension: Secondary | ICD-10-CM | POA: Insufficient documentation

## 2013-08-31 MED ORDER — ZOLPIDEM TARTRATE 5 MG PO TABS: 5.0000 mg | ORAL_TABLET | Freq: Every evening | ORAL | Status: DC | PRN

## 2013-08-31 MED ORDER — FUROSEMIDE 20 MG PO TABS: ORAL_TABLET | ORAL | Status: DC

## 2013-08-31 NOTE — Patient Instructions (Signed)
Take Lasix 40 mg Twice daily until Monday then decrease to 40 mg in AM and 20 mg in PM  Ambien 5 mg at bedtime as needed  Labs in 1 week  Your physician recommends that you schedule a follow-up appointment in: 1 month with echocardiogram

## 2013-09-01 NOTE — Progress Notes (Signed)
Patient ID: [name], male   DOB: May 27, 1966, 48 y.o.   MRN: [id number] ---[employer] Optivol ----   Referring Physician: Dr. Jens Som Primary Physician: [name], MD  Primary Cardiologist: GT    HPI: [name] is a 48 y.o. male with a history of HIV, HCV, ETOH abuse and CHF due to NICM s/p CRT-D (06/2013). He also has a h/o syncope and previously had an EP study which was non-inducible for VT.    Was admitted 01/19/13- 01/22/13 for acute on chronic systolic HF. ECHO EF 20-25%. He was diuresed with IV lasix. He was discharged on coreg 6.25 mg BID, digoxin 0.25 mg daily, lisinopril 40 mg daily, and spironolactone 25 mg daily. Discharge weight 150 lbs. He stopped spironolactone due to severe headaches.  He was not able to tolerate titration of Coreg to 25 mg bid due to lightheadedness.   ECHO 04/2013: EF 20-25%, mod/severe MR, LA mod dilated  Follow up: For about 1 week, patient has been more short of breath with exertion.  He is dyspneic after walking about 50-100 feet.  He cannot think of anything that triggered this.  No chest pain.  He has been orthopneic and sleeping with several pillows. Two days ago, we had him increase Lasix to 40 mg bid.  He is feeling better since doing this. No tachypalpitations.   Labs (12/14): digoxin 0.5 Labs (2/15): K 4.2, creatinine 0.88  SH: Lives with partner in [place]  ROS: All systems negative except as listed in HPI, PMH and Problem List.  Past Medical History  Diagnosis Date  . Hypertension   . HIV infection   . Hepatitis     Hep B and Hep C (patient does not report this but these are listed in previous notes.)  . G6PD deficiency   . Chronic systolic heart failure     a. Echo 12/05/11:  EF 20-25%, diff HK with mild sparing of IL wall, mild AI, mod MR, mild LAE, mild RVE, mild reduced RVF.;  b.  Echo 05/11/2012:  Mild LVH, EF 20-25%, Gr 1 diast dysfn, mod MR, mild LAE  . NICM (nonischemic cardiomyopathy)     cardia CTA 8/13  negative for obstructive CAD  . CHF (congestive heart failure)   . GERD (gastroesophageal reflux disease)   . Depression     Current Outpatient Prescriptions  Medication Sig Dispense Refill  . carvedilol (COREG) 12.5 MG tablet Take 18.75 mg by mouth 2 (two) times daily with a meal.       . colchicine 0.6 MG tablet Take 1 tablet (0.6 mg total) by mouth 2 (two) times daily.  30 tablet  2  . digoxin (LANOXIN) 0.25 MG tablet Take 1 tablet (0.25 mg total) by mouth daily.  30 tablet  6  . dolutegravir (TIVICAY) 50 MG tablet Take 50 mg by mouth daily with lunch.      . Emtricitab-Rilpivir-Tenofovir 200-25-300 MG TABS Take 1 tablet by mouth daily with lunch.      . furosemide (LASIX) 20 MG tablet Take 2 tabs in AM and 1 tab in PM  90 tablet  6  . HYDROcodone-acetaminophen (NORCO) 5-325 MG per tablet Take 1 tablet by mouth every 6 (six) hours as needed for moderate pain or severe pain.      Marland Kitchen lisinopril (PRINIVIL,ZESTRIL) 40 MG tablet Take 1 tablet (40 mg total) by mouth daily.  30 tablet  11  . ranitidine (ZANTAC) 150 MG tablet Take 1 tablet (150 mg total) by  mouth daily as needed for heartburn. Take 12 hours prior to Complera or 4 hours after.  30 tablet  1  . valACYclovir (VALTREX) 1000 MG tablet Take 1,000 mg by mouth 2 (two) times daily as needed (outbreaks). Take for 5 days for an outbreak      . zolpidem (AMBIEN) 5 MG tablet Take 1 tablet (5 mg total) by mouth at bedtime as needed for sleep.  30 tablet  1   No current facility-administered medications for this encounter.    Filed Vitals:   08/31/13 1034  BP: 124/76  Pulse: 80  Weight: 152 lb 12 oz (69.287 kg)  SpO2: 96%    Physical Exam:  General: Well appearing, NAD.  HEENT: normal. + multiple earrings/studs  Neck: supple. JVP ?8 cm. Carotids 2+ bilat; no bruits. No lymphadenopathy or thryomegaly appreciated.  Cor: PMI laterally displaced. RRR, 1/6 MR  Lungs: CTA Abdomen: soft, nontender, distended. No hepatosplenomegaly. No  bruits or masses. Good bowel sounds.  Extremities: no cyanosis, clubbing, rash, edema  Neuro: alert & oriented x 3, cranial nerves grossly intact. moves all 4 extremities w/o difficulty. Affect pleasant.  ASSESSMENT & PLAN:  1) Chronic systolic HF: Nonischemic cardiomyopathy, ?due to ETOH abuse. EF 20-25% (04/2013) s/p [employer] CRT-D (06/2013).  Over last week, increased dyspnea.  Regular heart rhythm on exam so do not think he has gone into atrial fibrillation.  Denies dietary indiscretion beyond usual diet. Feeling better with higher Lasix dose, minimal volume overload on exam at this point.  - Continue Lasix 40 mg bid for 1 week, then decrease to 40 qam/20 qpm.  BMET/BNP next week. - Post-CRT echo in 5/15.  - Continue current Coreg, digoxin, and lisinopril.  Unable to tolerate spironolactone and does not think he would be compliant with tid dosing for Bidil.  - Reinforced the need and importance of daily weights, a low sodium diet, and fluid restriction (less than 2 L a day). Instructed to call the HF clinic if weight increases more than 3 lbs overnight or 5 lbs in a week. 2) ETOH abuse: Rare ETOH now. 3) HTN: BP controlled.   Followup in 1 month.   Marca Ancona 09/01/2013

## 2013-09-12 ENCOUNTER — Telehealth: Payer: Self-pay | Admitting: Internal Medicine

## 2013-09-12 NOTE — Telephone Encounter (Signed)
Yes it's the 3 month post hospital visit

## 2013-09-12 NOTE — Telephone Encounter (Signed)
New problem   Pt want to know does he still need to come 10/05/13 appt if he came on 08/11/13 for dr taylor. Please call pt.

## 2013-09-14 ENCOUNTER — Ambulatory Visit (HOSPITAL_COMMUNITY)
Admission: RE | Admit: 2013-09-14 | Discharge: 2013-09-14 | Disposition: A | Discharge status: Home / Self Care | Payer: Medicare Other | Source: Ambulatory Visit | Attending: Internal Medicine | Admitting: Internal Medicine

## 2013-09-14 DIAGNOSIS — I509 Heart failure, unspecified: Secondary | ICD-10-CM | POA: Diagnosis not present

## 2013-09-14 DIAGNOSIS — I5022 Chronic systolic (congestive) heart failure: Secondary | ICD-10-CM | POA: Diagnosis not present

## 2013-09-14 DIAGNOSIS — I5023 Acute on chronic systolic (congestive) heart failure: Secondary | ICD-10-CM | POA: Diagnosis not present

## 2013-09-14 LAB — BASIC METABOLIC PANEL
BUN: 16 mg/dL (ref 6–23)
CALCIUM: 9.7 mg/dL (ref 8.4–10.5)
CO2: 25 mEq/L (ref 19–32)
CREATININE: 1.05 mg/dL (ref 0.50–1.35)
Chloride: 101 mEq/L (ref 96–112)
GFR calc Af Amer: >90 mL/min (ref >90)
GFR, EST NON AFRICAN AMERICAN: 81 mL/min — AB (ref >90)
Glucose, Bld: 92 mg/dL (ref 70–99)
Potassium: 4.2 mEq/L (ref 3.7–5.3)
SODIUM: 143 meq/L (ref 137–147)

## 2013-09-15 ENCOUNTER — Telehealth: Payer: Self-pay | Admitting: *Deleted

## 2013-09-15 NOTE — Telephone Encounter (Signed)
I spoke to patient about shock received. Patient says that he was unaware of the shock that occurred during sleep. He states that he has felt fine all day. I advised patient to go to ER if symptoms occur during weekend or if another shock is received. Will consult with GT either Mon or Tues. Patient aware of plan and also knows about driving restrictions.

## 2013-09-19 ENCOUNTER — Ambulatory Visit (INDEPENDENT_AMBULATORY_CARE_PROVIDER_SITE_OTHER): Payer: Medicare Other | Admitting: *Deleted

## 2013-09-19 DIAGNOSIS — I4729 Other ventricular tachycardia: Secondary | ICD-10-CM | POA: Diagnosis not present

## 2013-09-19 DIAGNOSIS — I472 Ventricular tachycardia: Secondary | ICD-10-CM

## 2013-09-19 DIAGNOSIS — R000 Tachycardia, unspecified: Secondary | ICD-10-CM

## 2013-09-19 LAB — MDC_IDC_ENUM_SESS_TYPE_INCLINIC
Lead Channel Setting Pacing Amplitude: 1.5 V
Lead Channel Setting Pacing Amplitude: 3.5 V
Lead Channel Setting Pacing Amplitude: 3.5 V
Lead Channel Setting Pacing Pulse Width: 0.4 ms
Lead Channel Setting Sensing Sensitivity: 0.3 mV
MDC IDC SET LEADCHNL LV PACING PULSEWIDTH: 0.4 ms
MDC IDC SET ZONE DETECTION INTERVAL: 300 ms
MDC IDC SET ZONE DETECTION INTERVAL: 350 ms
Zone Setting Detection Interval: 350 ms
Zone Setting Detection Interval: 360 ms

## 2013-09-19 NOTE — Progress Notes (Signed)
ICD interrogation only. Pt had VF episode resulting in shock. GT aware and no changes per GT. Follow up as planned.

## 2013-09-26 ENCOUNTER — Ambulatory Visit (HOSPITAL_COMMUNITY)
Admission: RE | Admit: 2013-09-26 | Discharge: 2013-09-26 | Disposition: A | Discharge status: Home / Self Care | Payer: Medicare Other | Source: Ambulatory Visit | Attending: Internal Medicine | Admitting: Internal Medicine

## 2013-09-26 DIAGNOSIS — I5022 Chronic systolic (congestive) heart failure: Secondary | ICD-10-CM

## 2013-09-28 ENCOUNTER — Encounter: Payer: Self-pay | Admitting: Internal Medicine

## 2013-09-28 ENCOUNTER — Ambulatory Visit (INDEPENDENT_AMBULATORY_CARE_PROVIDER_SITE_OTHER): Payer: Medicare Other | Admitting: Internal Medicine

## 2013-09-28 VITALS — BP 148/100 | HR 71 | Temp 98.3°F | Wt 147.0 lb

## 2013-09-28 DIAGNOSIS — B20 Human immunodeficiency virus [HIV] disease: Secondary | ICD-10-CM

## 2013-09-28 DIAGNOSIS — F1010 Alcohol abuse, uncomplicated: Secondary | ICD-10-CM

## 2013-09-28 NOTE — Assessment & Plan Note (Signed)
Reminded of conseqences of alcohol

## 2013-09-28 NOTE — Progress Notes (Signed)
  Subjective:    Patient ID: [name], male    DOB: [birth year], 48 y.o.   MRN: [id number]  HPI  He comes in for followup of his HIV. He was last seen 4 months ago and continues on tivicay with Complera. He denies any missed doses.   He otherwise drinks occasional alcohol and does not feel like she is going to stop at all. He does understand the consequences with his cardiomyopathy.   Review of Systems  Constitutional: Negative for fever, chills and fatigue.  Eyes: Negative for visual disturbance.  Respiratory: Negative for cough and shortness of breath.   Cardiovascular: Negative for chest pain, palpitations and leg swelling.  Gastrointestinal: Negative for nausea, abdominal pain and diarrhea.  Musculoskeletal: Negative for back pain.  Skin: Negative for rash.  Neurological: Negative for dizziness and light-headedness.  Hematological: Negative for adenopathy.  Psychiatric/Behavioral: Negative for dysphoric mood.       Objective:   Physical Exam  Constitutional: He is oriented to person, place, and time. He appears well-developed and well-nourished. No distress.  HENT:  Mouth/Throat: No oropharyngeal exudate.  Eyes: Right eye exhibits no discharge. Left eye exhibits no discharge. No scleral icterus.  Cardiovascular: Normal rate, regular rhythm and normal heart sounds.   Pulmonary/Chest: Effort normal and breath sounds normal. No respiratory distress.  Lymphadenopathy:    He has no cervical adenopathy.  Neurological: He is oriented to person, place, and time.  Skin: Skin is warm and dry. No rash noted.  Psychiatric: He has a normal mood and affect. His behavior is normal.          Assessment & Plan:

## 2013-09-28 NOTE — Assessment & Plan Note (Signed)
Doing well on his regimen. RTC 4 months.

## 2013-09-29 NOTE — Progress Notes (Signed)
Heart Failure Diagnostics Follow Up Form - [employer]    Fluid Index        ___   Fluid index trending with baseline       _X__   Fluid index trending up but below threshold, very slight increase pt denies increase wt, edema or SOB, he will monitor and call if needed       ___   Fluid index above threshold       ___   More than 3 threshold crossings this year        Current diuretic dose: Marland Kitchen.                                                                                                  Baseline weight: .                                      Current Weight: .                                     Patient Activity        Current activity level: .   4 hours daily                                                       Baseline activity level: .   4 hours daily                                                       AT/AF Burden        ___  Increase in AT/AF burden       ___  New onset of AT/AF       _x__  No AT/AF         ICD Therapies        ___ 1 or more ICD shocks    Interventions       ___  Adjust medications: .                                                                                                   ___  Labs: .  ___  Need for follow up appointment: .                                                                                _X__  HF diagnostics follow up: .   10/30/13                                                                                       ___  Review with EP: Marland Kitchen                                                                                                         Patient notified: Date: .    09/29/13                            By: .  Juliann Pares     Phone   .       Letter

## 2013-10-05 ENCOUNTER — Other Ambulatory Visit: Payer: Self-pay

## 2013-10-05 ENCOUNTER — Encounter: Payer: Self-pay | Admitting: Internal Medicine

## 2013-10-05 ENCOUNTER — Ambulatory Visit (INDEPENDENT_AMBULATORY_CARE_PROVIDER_SITE_OTHER): Payer: Medicare Other | Admitting: Internal Medicine

## 2013-10-05 VITALS — BP 124/100 | HR 84 | Ht 65.4 in | Wt 150.1 lb

## 2013-10-05 DIAGNOSIS — I428 Other cardiomyopathies: Secondary | ICD-10-CM | POA: Diagnosis not present

## 2013-10-05 DIAGNOSIS — I429 Cardiomyopathy, unspecified: Secondary | ICD-10-CM

## 2013-10-05 DIAGNOSIS — I4729 Other ventricular tachycardia: Secondary | ICD-10-CM | POA: Diagnosis not present

## 2013-10-05 DIAGNOSIS — T8149XA Infection following a procedure, other surgical site, initial encounter: Principal | ICD-10-CM

## 2013-10-05 DIAGNOSIS — T8141XA Infection following a procedure, superficial incisional surgical site, initial encounter: Secondary | ICD-10-CM

## 2013-10-05 DIAGNOSIS — R000 Tachycardia, unspecified: Secondary | ICD-10-CM

## 2013-10-05 DIAGNOSIS — I472 Ventricular tachycardia: Secondary | ICD-10-CM | POA: Diagnosis not present

## 2013-10-05 DIAGNOSIS — I5022 Chronic systolic (congestive) heart failure: Secondary | ICD-10-CM

## 2013-10-05 DIAGNOSIS — T8140XA Infection following a procedure, unspecified, initial encounter: Secondary | ICD-10-CM | POA: Diagnosis not present

## 2013-10-05 LAB — MDC_IDC_ENUM_SESS_TYPE_INCLINIC
Brady Statistic AP VS Percent: 0.02 %
Brady Statistic AS VP Percent: 97.84 %
Brady Statistic RA Percent Paced: 0.19 %
Date Time Interrogation Session: 20150507105911
HIGH POWER IMPEDANCE MEASURED VALUE: 247 Ohm
HIGH POWER IMPEDANCE MEASURED VALUE: 77 Ohm
Lead Channel Impedance Value: 513 Ohm
Lead Channel Impedance Value: 513 Ohm
Lead Channel Impedance Value: 589 Ohm
Lead Channel Impedance Value: 608 Ohm
Lead Channel Impedance Value: 931 Ohm
Lead Channel Pacing Threshold Amplitude: 0.5 V
Lead Channel Pacing Threshold Amplitude: 0.5 V
Lead Channel Pacing Threshold Amplitude: 1 V
Lead Channel Pacing Threshold Pulse Width: 0.4 ms
Lead Channel Pacing Threshold Pulse Width: 0.4 ms
Lead Channel Pacing Threshold Pulse Width: 0.4 ms
Lead Channel Setting Pacing Amplitude: 2 V
Lead Channel Setting Pacing Amplitude: 2.5 V
Lead Channel Setting Pacing Pulse Width: 0.4 ms
Lead Channel Setting Sensing Sensitivity: 0.3 mV
MDC IDC MSMT BATTERY REMAINING LONGEVITY: 108 mo
MDC IDC MSMT BATTERY VOLTAGE: 3.05 V
MDC IDC MSMT LEADCHNL RA SENSING INTR AMPL: 1.375 mV
MDC IDC MSMT LEADCHNL RV SENSING INTR AMPL: 28.25 mV
MDC IDC SET LEADCHNL LV PACING AMPLITUDE: 2 V
MDC IDC SET LEADCHNL LV PACING PULSEWIDTH: 0.4 ms
MDC IDC SET ZONE DETECTION INTERVAL: 300 ms
MDC IDC SET ZONE DETECTION INTERVAL: 360 ms
MDC IDC STAT BRADY AP VP PERCENT: 0.17 %
MDC IDC STAT BRADY AS VS PERCENT: 1.97 %
MDC IDC STAT BRADY RV PERCENT PACED: 16.87 %
Zone Setting Detection Interval: 350 ms
Zone Setting Detection Interval: 350 ms

## 2013-10-05 MED ORDER — FUROSEMIDE 40 MG PO TABS: ORAL_TABLET | ORAL | Status: DC

## 2013-10-05 MED ORDER — CARVEDILOL 12.5 MG PO TABS: 18.7500 mg | ORAL_TABLET | Freq: Two times a day (BID) | ORAL | Status: DC

## 2013-10-05 MED ORDER — FUROSEMIDE 40 MG PO TABS: 40.0000 mg | ORAL_TABLET | Freq: Two times a day (BID) | ORAL | Status: DC

## 2013-10-05 NOTE — Patient Instructions (Signed)
Please send a carelink transmission on 01/08/14  Your physician wants you to follow-up in: January with Dr. Ladona Ridgel. You will receive a reminder letter in the mail two months in advance. If you don't receive a letter, please call our office to schedule the follow-up appointment.

## 2013-10-05 NOTE — Assessment & Plan Note (Signed)
He has had an episode of VT, successfully treated with his ICD. He is instructed not to drive. If he has more arrhythmia, we will consider anti-arrhythmic medications.

## 2013-10-05 NOTE — Assessment & Plan Note (Signed)
His symptoms are class 2. He will continue his current meds.  

## 2013-10-05 NOTE — Progress Notes (Signed)
HPI [name] returns today for followup. He is a pleasant 48 yo man with a non-ischemic CM and syncope,and LBBB and chronic systolic heart failure, s/p ICD implant. He had an ICD shock several weeks ago while sleeping. No other complaints. No chest pain or sob. No edema. Allergies  Allergen Reactions  . Bactrim Rash     Current Outpatient Prescriptions  Medication Sig Dispense Refill  . carvedilol (COREG) 12.5 MG tablet Take 1.5 tablets (18.75 mg total) by mouth 2 (two) times daily with a meal.  273 tablet  3  . colchicine 0.6 MG tablet Take 1 tablet (0.6 mg total) by mouth 2 (two) times daily.  30 tablet  2  . digoxin (LANOXIN) 0.25 MG tablet Take 1 tablet (0.25 mg total) by mouth daily.  30 tablet  6  . dolutegravir (TIVICAY) 50 MG tablet Take 50 mg by mouth daily with lunch.      . Emtricitab-Rilpivir-Tenofovir 200-25-300 MG TABS Take 1 tablet by mouth daily with lunch.      . furosemide (LASIX) 40 MG tablet Take 1 tablet (40 mg total) by mouth 2 (two) times daily. Take 2 tabs in AM and 1 tab in PM  183 tablet  3  . HYDROcodone-acetaminophen (NORCO) 5-325 MG per tablet Take 1 tablet by mouth every 6 (six) hours as needed for moderate pain or severe pain.      Marland Kitchen. lisinopril (PRINIVIL,ZESTRIL) 40 MG tablet Take 1 tablet (40 mg total) by mouth daily.  30 tablet  11  . ranitidine (ZANTAC) 150 MG tablet Take 1 tablet (150 mg total) by mouth daily as needed for heartburn. Take 12 hours prior to Complera or 4 hours after.  30 tablet  1  . valACYclovir (VALTREX) 1000 MG tablet Take 1,000 mg by mouth 2 (two) times daily as needed (outbreaks). Take for 5 days for an outbreak      . zolpidem (AMBIEN) 5 MG tablet Take 1 tablet (5 mg total) by mouth at bedtime as needed for sleep.  30 tablet  1   No current facility-administered medications for this visit.     Past Medical History  Diagnosis Date  . Hypertension   . HIV infection   . Hepatitis     Hep B and Hep C (patient does not  report this but these are listed in previous notes.)  . G6PD deficiency   . Chronic systolic heart failure     a. Echo 12/05/11:  EF 20-25%, diff HK with mild sparing of IL wall, mild AI, mod MR, mild LAE, mild RVE, mild reduced RVF.;  b.  Echo 05/11/2012:  Mild LVH, EF 20-25%, Gr 1 diast dysfn, mod MR, mild LAE  . NICM (nonischemic cardiomyopathy)     cardia CTA 8/13 negative for obstructive CAD  . CHF (congestive heart failure)   . GERD (gastroesophageal reflux disease)   . Depression     ROS:   All systems reviewed and negative except as noted in the HPI.   Past Surgical History  Procedure Laterality Date  . Finger surgery      Thumb laceration.    . Esophagogastroduodenoscopy  12/07/2011    Procedure: ESOPHAGOGASTRODUODENOSCOPY (EGD);  Surgeon: Meryl DareMalcolm T Stark, MD,FACG;  [facility];  Service: Endoscopy;  Laterality: N/A;     Family History  Problem Relation Age of Onset  . Cancer Mother      History   Social History  . Marital Status: Single  Spouse Name: N/A    Number of Children: N/A  . Years of Education: N/A   Occupational History  . Unemployed    Social History Main Topics  . Smoking status: Never Smoker   . Smokeless tobacco: Never Used  . Alcohol Use: 0.6 oz/week    1 Cans of beer per week     Comment: cutting back   . Drug Use: No     Comment: marijuana daily  . Sexual Activity: Yes    Partners: Male     Comment: 28 year relationship, declined condoms   Other Topics Concern  . Not on file   Social History Narrative   Lives alone.  Drinks beer daily.  Liquor rarely.  He occasionally smokes marijuana..     BP 124/100  Pulse 84  Ht 5' 5.4" (1.661 m)  Wt 150 lb 1.9 oz (68.094 kg)  BMI 24.68 kg/m2  Physical Exam:  Well appearing middle aged man, NAD HEENT: Unremarkable Neck:  No JVD, no thyromegally Back:  No CVA tenderness Lungs:  Clear HEART:  Regular rate rhythm, no murmurs, no rubs, no clicks Abd:  soft, positive bowel  sounds, no organomegally, no rebound, no guarding Ext:  2 plus pulses, no edema, no cyanosis, no clubbing Skin:  No rashes no nodules Neuro:  CN II through XII intact, motor grossly intact  EKG NSR with BiV pacing  DEVICE  Normal device function.  See PaceArt for details.   Assess/Plan:

## 2013-10-10 ENCOUNTER — Telehealth: Payer: Self-pay | Admitting: Internal Medicine

## 2013-10-10 DIAGNOSIS — I429 Cardiomyopathy, unspecified: Secondary | ICD-10-CM

## 2013-10-10 NOTE — Telephone Encounter (Signed)
Called patient again and let him know to continue  As prescribed  40mg  in the am and 20mg  in the pm

## 2013-10-10 NOTE — Telephone Encounter (Signed)
Spoke with patient earlier and asked him to take 40mg  in the am and 20mg  in the pm.  I am not sure why he is calling back.  I have left a message in regards to his Furosemide dose and his second message I have asked him to continue as he was previously as Dr Ladona Ridgel did not change

## 2013-10-10 NOTE — Telephone Encounter (Signed)
Follow up     Patient calling to see if the nurse heard anything from Dr. Ladona Ridgel

## 2013-10-10 NOTE — Telephone Encounter (Signed)
Follow up ° ° ° ° °Returned the nurses call. °

## 2013-10-10 NOTE — Telephone Encounter (Signed)
New message      Took 2 40mg  lasix pills at the same time yesterday and fainted.  Want to double check dosage and make sure he was supposed to take that much medication. Please call

## 2013-10-11 MED ORDER — FUROSEMIDE 40 MG PO TABS: ORAL_TABLET | ORAL | Status: DC

## 2013-10-16 ENCOUNTER — Ambulatory Visit (HOSPITAL_COMMUNITY)
Admission: RE | Admit: 2013-10-16 | Discharge: 2013-10-16 | Disposition: A | Discharge status: Home / Self Care | Payer: Medicare Other | Source: Ambulatory Visit | Attending: Internal Medicine | Admitting: Internal Medicine

## 2013-10-16 ENCOUNTER — Encounter: Payer: Self-pay | Admitting: Internal Medicine

## 2013-10-16 ENCOUNTER — Ambulatory Visit (HOSPITAL_BASED_OUTPATIENT_CLINIC_OR_DEPARTMENT_OTHER)
Admission: RE | Admit: 2013-10-16 | Discharge: 2013-10-16 | Disposition: A | Discharge status: Home / Self Care | Payer: Medicare Other | Source: Ambulatory Visit | Attending: Internal Medicine | Admitting: Internal Medicine

## 2013-10-16 ENCOUNTER — Encounter (HOSPITAL_COMMUNITY): Payer: Self-pay

## 2013-10-16 VITALS — BP 128/70 | HR 98 | Wt 151.0 lb

## 2013-10-16 DIAGNOSIS — I5022 Chronic systolic (congestive) heart failure: Secondary | ICD-10-CM | POA: Insufficient documentation

## 2013-10-16 DIAGNOSIS — I059 Rheumatic mitral valve disease, unspecified: Secondary | ICD-10-CM | POA: Diagnosis not present

## 2013-10-16 DIAGNOSIS — I509 Heart failure, unspecified: Secondary | ICD-10-CM | POA: Diagnosis not present

## 2013-10-16 DIAGNOSIS — I10 Essential (primary) hypertension: Secondary | ICD-10-CM | POA: Insufficient documentation

## 2013-10-16 DIAGNOSIS — K219 Gastro-esophageal reflux disease without esophagitis: Secondary | ICD-10-CM | POA: Diagnosis not present

## 2013-10-16 DIAGNOSIS — I472 Ventricular tachycardia, unspecified: Secondary | ICD-10-CM | POA: Diagnosis not present

## 2013-10-16 DIAGNOSIS — I4729 Other ventricular tachycardia: Secondary | ICD-10-CM | POA: Diagnosis not present

## 2013-10-16 DIAGNOSIS — I428 Other cardiomyopathies: Secondary | ICD-10-CM | POA: Insufficient documentation

## 2013-10-16 DIAGNOSIS — R000 Tachycardia, unspecified: Secondary | ICD-10-CM

## 2013-10-16 NOTE — Progress Notes (Signed)
Patient ID: [name], male   DOB: [birth year], 48 y.o.   MRN: [id number] ---[employer]tronic Optivol ----   Referring Physician: Dr. Jens Som Primary Physician: [name], MD  Primary Cardiologist: Dr Ladona Ridgel    HPI: [name] is a 48 y.o. male with a history of HIV 1998, HCV, ETOH abuse and CHF due to NICM s/p CRT-D (06/2013). He also has a h/o syncope and previously had an EP study which was non-inducible for VT.    Was admitted 01/19/13- 01/22/13 for acute on chronic systolic HF. ECHO EF 20-25%. He was diuresed with IV lasix. He was discharged on coreg 6.25 mg BID, digoxin 0.25 mg daily, lisinopril 40 mg daily, and spironolactone 25 mg daily. Discharge weight 150 lbs. He stopped spironolactone due to severe headaches.  He was not able to tolerate titration of Coreg to 25 mg bid due to lightheadedness.    He returns for follow up. Denies SOB/PND/Orthopnea/CP. Weight at home 150-152 pounds. Compliant with medications. No alcohol or drug use. Had ICD fire April 2015. He followed up with Dr Ladona Ridgel with no new recommendations.     ECHO 04/2013: EF 20-25%, mod/severe MR, LA mod dilated ECHO 10/16/13 EF 20-25%    Labs (12/14): digoxin 0.5 Labs (2/15): K 4.2, creatinine 0.88 Labs (08/2013): K 4.2 Creatinine 1.02  SH: Lives with partner in [place]  ROS: All systems negative except as listed in HPI, PMH and Problem List.  Past Medical History  Diagnosis Date  . Hypertension   . HIV infection   . Hepatitis     Hep B and Hep C (patient does not report this but these are listed in previous notes.)  . G6PD deficiency   . Chronic systolic heart failure     a. Echo 12/05/11:  EF 20-25%, diff HK with mild sparing of IL wall, mild AI, mod MR, mild LAE, mild RVE, mild reduced RVF.;  b.  Echo 05/11/2012:  Mild LVH, EF 20-25%, Gr 1 diast dysfn, mod MR, mild LAE  . NICM (nonischemic cardiomyopathy)     cardia CTA 8/13 negative for obstructive CAD  . CHF (congestive heart failure)   .  GERD (gastroesophageal reflux disease)   . Depression     Current Outpatient Prescriptions  Medication Sig Dispense Refill  . carvedilol (COREG) 12.5 MG tablet Take 1.5 tablets (18.75 mg total) by mouth 2 (two) times daily with a meal.  273 tablet  3  . colchicine 0.6 MG tablet Take 1 tablet (0.6 mg total) by mouth 2 (two) times daily.  30 tablet  2  . digoxin (LANOXIN) 0.25 MG tablet Take 1 tablet (0.25 mg total) by mouth daily.  30 tablet  6  . dolutegravir (TIVICAY) 50 MG tablet Take 50 mg by mouth daily with lunch.      . Emtricitab-Rilpivir-Tenofovir 200-25-300 MG TABS Take 1 tablet by mouth daily with lunch.      . furosemide (LASIX) 40 MG tablet Take 1 tablet in the am and 1/2 tablet in pm  135 tablet  3  . HYDROcodone-acetaminophen (NORCO) 5-325 MG per tablet Take 1 tablet by mouth every 6 (six) hours as needed for moderate pain or severe pain.      Marland Kitchen lisinopril (PRINIVIL,ZESTRIL) 40 MG tablet Take 1 tablet (40 mg total) by mouth daily.  30 tablet  11  . ranitidine (ZANTAC) 150 MG tablet Take 1 tablet (150 mg total) by mouth daily as needed for heartburn. Take 12 hours prior to Complera or  4 hours after.  30 tablet  1  . valACYclovir (VALTREX) 1000 MG tablet Take 1,000 mg by mouth 2 (two) times daily as needed (outbreaks). Take for 5 days for an outbreak      . zolpidem (AMBIEN) 5 MG tablet Take 1 tablet (5 mg total) by mouth at bedtime as needed for sleep.  30 tablet  1   No current facility-administered medications for this encounter.    Filed Vitals:   10/16/13 1222  BP: 128/70  Pulse: 98  Weight: 151 lb (68.493 kg)  SpO2: 98%    Physical Exam:  General: Well appearing, NAD.  HEENT: normal. + multiple earrings/studs  Neck: supple. JVP cm. Carotids 2+ bilat; no bruits. No lymphadenopathy or thryomegaly appreciated.  Cor: PMI laterally displaced. RRR, 2/6 MR  Lungs: CTA Abdomen: soft, nontender, distended. No hepatosplenomegaly. No bruits or masses. Good bowel sounds.   Extremities: no cyanosis, clubbing, rash, edema  Neuro: alert & oriented x 3, cranial nerves grossly intact. moves all 4 extremities w/o difficulty. Affect pleasant.  ASSESSMENT & PLAN:  1) Chronic systolic HF: Nonischemic cardiomyopathy, ?due to ETOH abuse. EF 20-25% (04/2013) s/p Medtronic CRT-D (06/2013). Optivol fluid well below threshold. Activity 4-5 hours per day.  NYHA I. Volume status stable. Continue Lasix 40 mg bid.    Continue current Coreg ( unable to tolerate 25 bid due to dizziness) , digoxin 0.25 mg, and lisinopril 40 mg daily.  Unable to tolerate spironolactone due to headaches and does not think he would be compliant with tid dosing for Bidil. - Dr [name] reviewed and discussed ECHO. EF 20-25%  - Reinforced the need and importance of daily weights, a low sodium diet, and fluid restriction (less than 2 L a day). Instructed to call the HF clinic if weight increases more than 3 lbs overnight or 5 lbs in a week. 2) ETOH abuse: Rare ETOH now. 3) HTN: BP controlled.   Follow up in  3 months   [name] NP-C  10/16/2013  Patient seen and examined with Tonye Becket, NP. We discussed all aspects of the encounter. I agree with the assessment and plan as stated above. Echo and Optivol- reviewed personally EF 20-25%. However, doing quite well - NYHA II. Volume status looks good on exam and by Optivol. On good meds. Had episode of VT and resuscitated by ICD. Congratulated him on continued abstinence from ETOH.   Markitta Ausburn R Marisol Giambra,MD 12:10 AM

## 2013-10-16 NOTE — Progress Notes (Signed)
  Echocardiogram 2D Echocardiogram has been performed.  Hermine Messick 10/16/2013, 11:41 AM

## 2013-10-16 NOTE — Patient Instructions (Signed)
Follow up in 3 months   Do the following things EVERYDAY: 1) Weigh yourself in the morning before breakfast. Write it down and keep it in a log. 2) Take your medicines as prescribed 3) Eat low salt foods-Limit salt (sodium) to 2000 mg per day.  4) Stay as active as you can everyday 5) Limit all fluids for the day to less than 2 liters  

## 2013-10-31 ENCOUNTER — Ambulatory Visit (HOSPITAL_COMMUNITY)
Admission: RE | Admit: 2013-10-31 | Discharge: 2013-10-31 | Disposition: A | Discharge status: Home / Self Care | Payer: Medicare Other | Source: Ambulatory Visit | Attending: Internal Medicine | Admitting: Internal Medicine

## 2013-10-31 DIAGNOSIS — I5022 Chronic systolic (congestive) heart failure: Secondary | ICD-10-CM

## 2013-10-31 DIAGNOSIS — I429 Cardiomyopathy, unspecified: Secondary | ICD-10-CM

## 2013-11-03 MED ORDER — FUROSEMIDE 40 MG PO TABS: 40.0000 mg | ORAL_TABLET | Freq: Two times a day (BID) | ORAL | Status: DC

## 2013-11-03 NOTE — Progress Notes (Signed)
Heart Failure Diagnostics Follow Up Form - [employer]    Fluid Index        ___   Fluid index trending with baseline       _X__   Fluid index trending up but below threshold       ___   Fluid index above threshold       ___   More than 3 threshold crossings this year        Current diuretic dose: .   Lasix 40 mg Twice daily                                                                                                Baseline weight: .        150                              Current Weight: .                                     Patient Activity        Current activity level: .  5 hours daily                                                        Baseline activity level: .   4-5 hours daily                                                       AT/AF Burden        ___  Increase in AT/AF burden       ___  New onset of AT/AF       _x__  No AT/AF         ICD Therapies        ___ 1 or more ICD shocks    Interventions--Spoke w/pt he states wt was on up about 3 lbs to 153 on 6/2 when we received transmission, but is back down to 150 now which is his baseline, he denies SOB or edema, he states he has been out walking all over [place] and feels fine, will call if wt goes back up       ___  Adjust medications: .  ___  Labs: .                                                                                                                          ___  Need for follow up appointment: .                                                                                _X__  HF diagnostics follow up: .   12/04/13                                                                                       ___  Review with EP: Marland Kitchen                                                                                                         Patient notified: Date: .   11/03/13                             By: .  Juliann Pares      Phone   .       Letter

## 2013-11-08 ENCOUNTER — Encounter: Payer: Self-pay | Admitting: Internal Medicine

## 2013-11-17 ENCOUNTER — Encounter: Payer: Self-pay | Admitting: Internal Medicine

## 2013-12-05 ENCOUNTER — Telehealth (HOSPITAL_COMMUNITY): Payer: Medicare Other

## 2013-12-12 ENCOUNTER — Encounter: Payer: Self-pay | Admitting: Cardiology

## 2014-01-08 ENCOUNTER — Ambulatory Visit (INDEPENDENT_AMBULATORY_CARE_PROVIDER_SITE_OTHER): Payer: Medicare Other | Admitting: *Deleted

## 2014-01-08 ENCOUNTER — Telehealth: Payer: Self-pay | Admitting: Cardiology

## 2014-01-08 DIAGNOSIS — I428 Other cardiomyopathies: Secondary | ICD-10-CM

## 2014-01-08 DIAGNOSIS — I429 Cardiomyopathy, unspecified: Secondary | ICD-10-CM

## 2014-01-08 NOTE — Telephone Encounter (Signed)
Attempted to call pt x1 to remind pt of remote transmission. No answer  

## 2014-01-09 NOTE — Progress Notes (Signed)
Remote ICD transmission.   

## 2014-01-12 ENCOUNTER — Encounter (HOSPITAL_COMMUNITY): Payer: Medicare Other

## 2014-01-16 ENCOUNTER — Other Ambulatory Visit: Payer: Medicare Other

## 2014-01-17 ENCOUNTER — Other Ambulatory Visit: Payer: Medicare Other

## 2014-01-17 DIAGNOSIS — B20 Human immunodeficiency virus [HIV] disease: Secondary | ICD-10-CM

## 2014-01-17 NOTE — Progress Notes (Signed)
Patient ID: [name], male   DOB: [birth year], 48 y.o.   MRN: [id number] ---[employer]onic Optivol ----   Referring Physician: Dr. Jens Somrenshaw Primary Physician: Staci RighterOMER, ROBERT, MD  Primary Cardiologist: Dr Ladona Ridgelaylor    HPI: [name] is a 48 y.o. male with a history of HIV 1998, HCV, ETOH abuse and CHF due to NICM s/p Medtronic  CRT-D (06/2013). He also has a h/o syncope and previously had an EP study which was non-inducible for VT.    Was admitted 01/19/13- 01/22/13 for acute on chronic systolic HF. ECHO EF 20-25%. He was diuresed with IV lasix. He was discharged on coreg 6.25 mg BID, digoxin 0.25 mg daily, lisinopril 40 mg daily, and spironolactone 25 mg daily. Discharge weight 150 lbs. He stopped spironolactone due to severe headaches.  He was not able to tolerate titration of Coreg to 25 mg bid due to lightheadedness.   He returns for follow up. Overall he feels great. Denies SOB/PND/Orthopnea/CP. Weight at home 154 pounds.  Compliant with medications. No alcohol or drug use.  On July 27 he says he had ICD shock x 2. He was sleeping at that time. Walks a mile a day.   ECHO 04/2013: EF 20-25%, mod/severe MR, LA mod dilated ECHO 10/16/13 EF 20-25%   Labs (12/14): digoxin 0.5 Labs (2/15): K 4.2, creatinine 0.88 Labs (08/2013): K 4.2 Creatinine 1.02  Optivol: Activy ~4 hours  Fluid below threshold. VT noted Device interrogated to by [employer].   SH: [place] Drinks 2 beers per day.   ROS: All systems negative except as listed in HPI, PMH and Problem List.  Past Medical History  Diagnosis Date  . Hypertension   . HIV infection   . Hepatitis     Hep B and Hep C (patient does not report this but these are listed in previous notes.)  . G6PD deficiency   . Chronic systolic heart failure     a. Echo 12/05/11:  EF 20-25%, diff HK with mild sparing of IL wall, mild AI, mod MR, mild LAE, mild RVE, mild reduced RVF.;  b.  Echo 05/11/2012:  Mild LVH, EF 20-25%, Gr 1  diast dysfn, mod MR, mild LAE  . NICM (nonischemic cardiomyopathy)     cardia CTA 8/13 negative for obstructive CAD  . CHF (congestive heart failure)   . GERD (gastroesophageal reflux disease)   . Depression     Current Outpatient Prescriptions  Medication Sig Dispense Refill  . carvedilol (COREG) 12.5 MG tablet Take 1.5 tablets (18.75 mg total) by mouth 2 (two) times daily with a meal.  273 tablet  3  . colchicine 0.6 MG tablet Take 1 tablet (0.6 mg total) by mouth 2 (two) times daily.  30 tablet  2  . digoxin (LANOXIN) 0.25 MG tablet Take 1 tablet (0.25 mg total) by mouth daily.  30 tablet  6  . dolutegravir (TIVICAY) 50 MG tablet Take 50 mg by mouth daily with lunch.      . Emtricitab-Rilpivir-Tenofovir 200-25-300 MG TABS Take 1 tablet by mouth daily with lunch.      . furosemide (LASIX) 40 MG tablet Take 1 tablet (40 mg total) by mouth 2 (two) times daily.  135 tablet  3  . HYDROcodone-acetaminophen (NORCO) 5-325 MG per tablet Take 1 tablet by mouth every 6 (six) hours as needed for moderate pain or severe pain.      Marland Kitchen. lisinopril (PRINIVIL,ZESTRIL) 40 MG tablet Take 1 tablet (40 mg total)  by mouth daily.  30 tablet  11  . ranitidine (ZANTAC) 150 MG tablet Take 1 tablet (150 mg total) by mouth daily as needed for heartburn. Take 12 hours prior to Complera or 4 hours after.  30 tablet  1  . valACYclovir (VALTREX) 1000 MG tablet Take 1,000 mg by mouth 2 (two) times daily as needed (outbreaks). Take for 5 days for an outbreak      . zolpidem (AMBIEN) 5 MG tablet Take 1 tablet (5 mg total) by mouth at bedtime as needed for sleep.  30 tablet  1   No current facility-administered medications for this encounter.    Filed Vitals:   01/19/14 1055  BP: 130/84  Pulse: 74  Weight: 154 lb (69.854 kg)  SpO2: 98%    Physical Exam:  General: Well appearing, NAD.  HEENT: normal. + multiple earrings/studs  Neck: supple. JVP 5-6 cm. Carotids 2+ bilat; no bruits. No lymphadenopathy or thryomegaly  appreciated.  Cor: PMI laterally displaced. RRR, 2/6 MR  Lungs: CTA Abdomen: soft, nontender, distended. No hepatosplenomegaly. No bruits or masses. Good bowel sounds.  Extremities: no cyanosis, clubbing, rash, edema  Neuro: alert & oriented x 3, cranial nerves grossly intact. moves all 4 extremities w/o difficulty. Affect pleasant.  ASSESSMENT & PLAN:  1) Chronic systolic HF: Nonischemic cardiomyopathy, ?due to ETOH abuse. EF 20-25% (06/2012) s/p Medtronic CRT-D (06/2013). Optivol fluid well below threshold. Activity 4 hours per day.  NYHA I. Volume status stable. Continue Lasix 40 mg bid.    Continue current Coreg  18.75 ( unable to tolerate 25 bid due to dizziness) , digoxin 0.25 mg dig level 05/2013 On goal dose of lisinopril 40 mg daily.   Unable to tolerate spironolactone due to headaches Not on Bidil because he says he  wouldn't be compliant with 3 times  daily.   - Reinforced the need and importance of daily weights, a low sodium diet, and fluid restriction (less than 2 L a day). Instructed to call the HF clinic if weight increases more than 3 lbs overnight or 5 lbs in a week. 2) ETOH abuse:  Drinks 2 beers per day. Encouraged to stop.  3) HTN: BP controlled. 4) CRT-D Medtronic- ICD fired   11/29/13 x1 he was unaware V fib , 12/06/13 x2 he was unaware V fib ,  12/25/13 x 2---> V Fib.  Check BMET and Magnesium. Discussed Has follow up with Dr Ladona Ridgel.    Follow up in  3 months   CLEGG,AMY NP-C  01/19/2014

## 2014-01-18 LAB — T-HELPER CELL (CD4) - (RCID CLINIC ONLY)
CD4 % Helper T Cell: 25 % — ABNORMAL LOW (ref 33–55)
CD4 T Cell Abs: 550 /uL (ref 400–2700)

## 2014-01-19 ENCOUNTER — Other Ambulatory Visit (HOSPITAL_COMMUNITY): Payer: Self-pay

## 2014-01-19 ENCOUNTER — Ambulatory Visit (HOSPITAL_COMMUNITY)
Admission: RE | Admit: 2014-01-19 | Discharge: 2014-01-19 | Disposition: A | Discharge status: Home / Self Care | Payer: Medicare Other | Source: Ambulatory Visit | Attending: Internal Medicine | Admitting: Internal Medicine

## 2014-01-19 VITALS — BP 130/84 | HR 74 | Wt 154.0 lb

## 2014-01-19 DIAGNOSIS — I4901 Ventricular fibrillation: Secondary | ICD-10-CM

## 2014-01-19 DIAGNOSIS — I5022 Chronic systolic (congestive) heart failure: Secondary | ICD-10-CM | POA: Diagnosis not present

## 2014-01-19 DIAGNOSIS — F1010 Alcohol abuse, uncomplicated: Secondary | ICD-10-CM

## 2014-01-19 DIAGNOSIS — I509 Heart failure, unspecified: Secondary | ICD-10-CM | POA: Diagnosis not present

## 2014-01-19 LAB — BASIC METABOLIC PANEL
Anion gap: 17 — ABNORMAL HIGH (ref 5–15)
BUN: 21 mg/dL (ref 6–23)
CO2: 22 mEq/L (ref 19–32)
Calcium: 9.6 mg/dL (ref 8.4–10.5)
Chloride: 103 mEq/L (ref 96–112)
Creatinine, Ser: 0.98 mg/dL (ref 0.50–1.35)
GFR calc Af Amer: >90 mL/min (ref >90)
GFR calc non Af Amer: >90 mL/min (ref >90)
GLUCOSE: 100 mg/dL — AB (ref 70–99)
POTASSIUM: 3.8 meq/L (ref 3.7–5.3)
Sodium: 142 mEq/L (ref 137–147)

## 2014-01-19 LAB — HIV-1 RNA QUANT-NO REFLEX-BLD: HIV 1 RNA Quant: <20 copies/mL (ref <20)

## 2014-01-19 LAB — MAGNESIUM: Magnesium: 2.1 mg/dL (ref 1.5–2.5)

## 2014-01-19 MED ORDER — POTASSIUM CHLORIDE CRYS ER 20 MEQ PO TBCR: 20.0000 meq | EXTENDED_RELEASE_TABLET | Freq: Every day | ORAL | Status: DC

## 2014-01-19 NOTE — Patient Instructions (Signed)
Follow up in 3 months  Follow up on Tuesday with Dr Ladona Ridgel    Do the following things EVERYDAY: 1) Weigh yourself in the morning before breakfast. Write it down and keep it in a log. 2) Take your medicines as prescribed 3) Eat low salt foods-Limit salt (sodium) to 2000 mg per day.  4) Stay as active as you can everyday 5) Limit all fluids for the day to less than 2 liters

## 2014-01-22 LAB — MDC_IDC_ENUM_SESS_TYPE_REMOTE
Battery Voltage: 3.02 V
Brady Statistic AP VP Percent: 1.74 %
Brady Statistic AS VP Percent: 96.51 %
Brady Statistic AS VS Percent: 1.7 %
HIGH POWER IMPEDANCE MEASURED VALUE: 247 Ohm
HIGH POWER IMPEDANCE MEASURED VALUE: 79 Ohm
Lead Channel Impedance Value: 532 Ohm
Lead Channel Impedance Value: 551 Ohm
Lead Channel Impedance Value: 608 Ohm
Lead Channel Impedance Value: 931 Ohm
Lead Channel Pacing Threshold Amplitude: 0.5 V
Lead Channel Pacing Threshold Amplitude: 0.625 V
Lead Channel Pacing Threshold Pulse Width: 0.4 ms
Lead Channel Pacing Threshold Pulse Width: 0.4 ms
Lead Channel Pacing Threshold Pulse Width: 0.4 ms
Lead Channel Sensing Intrinsic Amplitude: 19.625 mV
Lead Channel Sensing Intrinsic Amplitude: 19.625 mV
Lead Channel Sensing Intrinsic Amplitude: 2 mV
Lead Channel Setting Pacing Amplitude: 1.5 V
Lead Channel Setting Pacing Amplitude: 2 V
Lead Channel Setting Pacing Amplitude: 2.5 V
Lead Channel Setting Pacing Pulse Width: 0.4 ms
Lead Channel Setting Pacing Pulse Width: 0.4 ms
Lead Channel Setting Sensing Sensitivity: 0.3 mV
MDC IDC MSMT BATTERY REMAINING LONGEVITY: 109 mo
MDC IDC MSMT LEADCHNL LV IMPEDANCE VALUE: 513 Ohm
MDC IDC MSMT LEADCHNL RA PACING THRESHOLD AMPLITUDE: 0.875 V
MDC IDC MSMT LEADCHNL RA SENSING INTR AMPL: 2 mV
MDC IDC SESS DTM: 20150810192737
MDC IDC SET ZONE DETECTION INTERVAL: 300 ms
MDC IDC SET ZONE DETECTION INTERVAL: 350 ms
MDC IDC STAT BRADY AP VS PERCENT: 0.05 %
MDC IDC STAT BRADY RA PERCENT PACED: 1.79 %
MDC IDC STAT BRADY RV PERCENT PACED: 10.53 %
Zone Setting Detection Interval: 350 ms
Zone Setting Detection Interval: 360 ms

## 2014-01-23 ENCOUNTER — Encounter: Payer: Self-pay | Admitting: Internal Medicine

## 2014-01-23 ENCOUNTER — Ambulatory Visit (INDEPENDENT_AMBULATORY_CARE_PROVIDER_SITE_OTHER): Payer: Medicare Other | Admitting: Internal Medicine

## 2014-01-23 VITALS — BP 153/99 | HR 61 | Ht 64.5 in | Wt 154.0 lb

## 2014-01-23 DIAGNOSIS — I5022 Chronic systolic (congestive) heart failure: Secondary | ICD-10-CM

## 2014-01-23 DIAGNOSIS — I429 Cardiomyopathy, unspecified: Secondary | ICD-10-CM

## 2014-01-23 DIAGNOSIS — Z95810 Presence of automatic (implantable) cardiac defibrillator: Secondary | ICD-10-CM | POA: Diagnosis not present

## 2014-01-23 DIAGNOSIS — I428 Other cardiomyopathies: Secondary | ICD-10-CM

## 2014-01-23 LAB — MDC_IDC_ENUM_SESS_TYPE_INCLINIC
Battery Remaining Longevity: 106 mo
Brady Statistic AP VP Percent: 0.53 %
Brady Statistic AP VS Percent: 0.02 %
Brady Statistic AS VP Percent: 97.93 %
Brady Statistic AS VS Percent: 1.52 %
Brady Statistic RA Percent Paced: 0.55 %
Brady Statistic RV Percent Paced: 9 %
Date Time Interrogation Session: 20150825113253
HIGH POWER IMPEDANCE MEASURED VALUE: 285 Ohm
HIGH POWER IMPEDANCE MEASURED VALUE: 71 Ohm
Lead Channel Impedance Value: 475 Ohm
Lead Channel Impedance Value: 475 Ohm
Lead Channel Impedance Value: 608 Ohm
Lead Channel Pacing Threshold Amplitude: 0.75 V
Lead Channel Pacing Threshold Amplitude: 0.75 V
Lead Channel Pacing Threshold Pulse Width: 0.4 ms
Lead Channel Pacing Threshold Pulse Width: 0.4 ms
Lead Channel Sensing Intrinsic Amplitude: 1.5 mV
Lead Channel Sensing Intrinsic Amplitude: 25.25 mV
Lead Channel Setting Pacing Amplitude: 1.5 V
Lead Channel Setting Pacing Amplitude: 2 V
Lead Channel Setting Pacing Amplitude: 2.5 V
Lead Channel Setting Pacing Pulse Width: 0.4 ms
MDC IDC MSMT BATTERY VOLTAGE: 3.01 V
MDC IDC MSMT LEADCHNL LV IMPEDANCE VALUE: 418 Ohm
MDC IDC MSMT LEADCHNL LV IMPEDANCE VALUE: 950 Ohm
MDC IDC MSMT LEADCHNL LV PACING THRESHOLD AMPLITUDE: 0.5 V
MDC IDC MSMT LEADCHNL LV PACING THRESHOLD PULSEWIDTH: 0.4 ms
MDC IDC SET LEADCHNL LV PACING PULSEWIDTH: 0.4 ms
MDC IDC SET LEADCHNL RV SENSING SENSITIVITY: 0.3 mV
MDC IDC SET ZONE DETECTION INTERVAL: 300 ms
Zone Setting Detection Interval: 350 ms
Zone Setting Detection Interval: 350 ms
Zone Setting Detection Interval: 360 ms

## 2014-01-23 MED ORDER — SOTALOL HCL (AF) 120 MG PO TABS: 120.0000 mg | ORAL_TABLET | Freq: Two times a day (BID) | ORAL | Status: DC

## 2014-01-23 NOTE — Assessment & Plan Note (Signed)
His Medtronic biventricular ICD is working normally. We'll plan to recheck in several months. 

## 2014-01-23 NOTE — Assessment & Plan Note (Signed)
He has had multiple ICD shocks for ventricular tachycardia and fibrillation. Today we will start sotalol 120 mg twice daily. He will return in several days for an ECG.

## 2014-01-23 NOTE — Patient Instructions (Signed)
Your physician recommends that you schedule a follow-up appointment in: 1 week for EKG in nurse room and 3 months with Dr Ladona Ridgel   Your physician has recommended you make the following change in your medication:  1) Start Sotalol 120mg  twice daily

## 2014-01-23 NOTE — Assessment & Plan Note (Signed)
His symptoms are class IIA. No change in medical therapy for heart failure.

## 2014-01-23 NOTE — Progress Notes (Signed)
HPI [name] returns today for followup. He is a pleasant 48 yo man with a non-ischemic CM and syncope,and LBBB and chronic systolic heart failure, s/p ICD implant. He has had several ICD shocks secondary to ventricular tachycardia, which degenerated into ventricular fibrillation. He denies shortness of breath or worsening heart failure symptoms. His heart failure is currently class IIA. No other complaints. No chest pain or sob. No edema. He denies dietary indiscretion. Allergies  Allergen Reactions  . Bactrim Rash     Current Outpatient Prescriptions  Medication Sig Dispense Refill  . carvedilol (COREG) 12.5 MG tablet Take 1.5 tablets (18.75 mg total) by mouth 2 (two) times daily with a meal.  273 tablet  3  . colchicine 0.6 MG tablet Take 1 tablet (0.6 mg total) by mouth 2 (two) times daily.  30 tablet  2  . digoxin (LANOXIN) 0.25 MG tablet Take 1 tablet (0.25 mg total) by mouth daily.  30 tablet  6  . dolutegravir (TIVICAY) 50 MG tablet Take 50 mg by mouth daily with lunch.      . Emtricitab-Rilpivir-Tenofovir 200-25-300 MG TABS Take 1 tablet by mouth daily with lunch.      . furosemide (LASIX) 40 MG tablet Take 1 tablet (40 mg total) by mouth 2 (two) times daily.  135 tablet  3  . HYDROcodone-acetaminophen (NORCO) 5-325 MG per tablet Take 1 tablet by mouth every 6 (six) hours as needed for moderate pain or severe pain.      Marland Kitchen lisinopril (PRINIVIL,ZESTRIL) 40 MG tablet Take 1 tablet (40 mg total) by mouth daily.  30 tablet  11  . potassium chloride SA (KLOR-CON M20) 20 MEQ tablet Take 1 tablet (20 mEq total) by mouth daily.  90 tablet  3  . ranitidine (ZANTAC) 150 MG tablet Take 1 tablet (150 mg total) by mouth daily as needed for heartburn. Take 12 hours prior to Complera or 4 hours after.  30 tablet  1  . valACYclovir (VALTREX) 1000 MG tablet Take 1,000 mg by mouth 2 (two) times daily as needed (outbreaks). Take for 5 days for an outbreak      . zolpidem (AMBIEN) 5 MG tablet  Take 1 tablet (5 mg total) by mouth at bedtime as needed for sleep.  30 tablet  1   No current facility-administered medications for this visit.     Past Medical History  Diagnosis Date  . Hypertension   . HIV infection   . Hepatitis     Hep B and Hep C (patient does not report this but these are listed in previous notes.)  . G6PD deficiency   . Chronic systolic heart failure     a. Echo 12/05/11:  EF 20-25%, diff HK with mild sparing of IL wall, mild AI, mod MR, mild LAE, mild RVE, mild reduced RVF.;  b.  Echo 05/11/2012:  Mild LVH, EF 20-25%, Gr 1 diast dysfn, mod MR, mild LAE  . NICM (nonischemic cardiomyopathy)     cardia CTA 8/13 negative for obstructive CAD  . CHF (congestive heart failure)   . GERD (gastroesophageal reflux disease)   . Depression     ROS:   All systems reviewed and negative except as noted in the HPI.   Past Surgical History  Procedure Laterality Date  . Finger surgery      Thumb laceration.    . Esophagogastroduodenoscopy  12/07/2011    Procedure: ESOPHAGOGASTRODUODENOSCOPY (EGD);  Surgeon: [name], MD,FACG;  [facility]  ENDOSCOPY;  Service: Endoscopy;  Laterality: N/A;     Family History  Problem Relation Age of Onset  . Cancer Mother      History   Social History  . Marital Status: Single    Spouse Name: N/A    Number of Children: N/A  . Years of Education: N/A   Occupational History  . Unemployed    Social History Main Topics  . Smoking status: Never Smoker   . Smokeless tobacco: Never Used  . Alcohol Use: 0.6 oz/week    1 Cans of beer per week     Comment: cutting back   . Drug Use: No     Comment: marijuana daily  . Sexual Activity: Yes    Partners: Male     Comment: 28 year relationship, declined condoms   Other Topics Concern  . Not on file   Social History Narrative   Lives alone.  Drinks beer daily.  Liquor rarely.  He occasionally smokes marijuana..     BP 153/99  Pulse 61  Ht 5' 4.5" (1.638 m)  Wt 154  lb (69.854 kg)  BMI 26.04 kg/m2  Physical Exam:  Well appearing middle aged man, NAD HEENT: Unremarkable Neck:  No JVD, no thyromegally Back:  No CVA tenderness Lungs:  Clear with no wheezes, rales, or rhonchi. Well-healed ICD incision. HEART:  Regular rate rhythm, no murmurs, no rubs, no clicks Abd:  soft, positive bowel sounds, no organomegally, no rebound, no guarding Ext:  2 plus pulses, no edema, no cyanosis, no clubbing Skin:  No rashes no nodules Neuro:  CN II through XII intact, motor grossly intact  EKG NSR with BiV pacing  DEVICE  Normal device function.  See PaceArt for details.   Assess/Plan:

## 2014-01-25 ENCOUNTER — Telehealth: Payer: Self-pay | Admitting: *Deleted

## 2014-01-25 ENCOUNTER — Encounter: Payer: Self-pay | Admitting: Internal Medicine

## 2014-01-25 ENCOUNTER — Encounter: Payer: Self-pay | Admitting: Cardiology

## 2014-01-25 NOTE — Telephone Encounter (Signed)
Per [facility], pt was shocked this morning while sleeping at 4:02am. Pt does not recall event. Pt also states he was shocked again around 7am (remote not received confirming 7am episode). Pt feels fine now, no symptoms. Pt states he knew we would be calling due to receiving a shock. Pt is taking "new medicine from two days ago" (sotalol) as directed.  If pt receives another shock, changing sotalol will be considered per Dr. Ladona Ridgel. Pt understands and will call clinic if he is shocked.

## 2014-01-27 ENCOUNTER — Inpatient Hospital Stay (HOSPITAL_COMMUNITY)
Admission: EM | Admit: 2014-01-27 | Discharge: 2014-02-07 | DRG: 286 | Disposition: A | Discharge status: Home Health | Payer: Medicare Other | Attending: Internal Medicine | Admitting: Internal Medicine

## 2014-01-27 ENCOUNTER — Encounter (HOSPITAL_COMMUNITY): Payer: Self-pay | Admitting: Emergency Medicine

## 2014-01-27 ENCOUNTER — Emergency Department (HOSPITAL_COMMUNITY): Payer: Medicare Other

## 2014-01-27 DIAGNOSIS — E876 Hypokalemia: Secondary | ICD-10-CM | POA: Diagnosis not present

## 2014-01-27 DIAGNOSIS — R110 Nausea: Secondary | ICD-10-CM

## 2014-01-27 DIAGNOSIS — I428 Other cardiomyopathies: Secondary | ICD-10-CM | POA: Diagnosis not present

## 2014-01-27 DIAGNOSIS — I472 Ventricular tachycardia, unspecified: Secondary | ICD-10-CM | POA: Diagnosis not present

## 2014-01-27 DIAGNOSIS — I4891 Unspecified atrial fibrillation: Secondary | ICD-10-CM | POA: Diagnosis not present

## 2014-01-27 DIAGNOSIS — I429 Cardiomyopathy, unspecified: Secondary | ICD-10-CM | POA: Diagnosis present

## 2014-01-27 DIAGNOSIS — Z881 Allergy status to other antibiotic agents status: Secondary | ICD-10-CM

## 2014-01-27 DIAGNOSIS — Z21 Asymptomatic human immunodeficiency virus [HIV] infection status: Secondary | ICD-10-CM | POA: Diagnosis present

## 2014-01-27 DIAGNOSIS — B182 Chronic viral hepatitis C: Secondary | ICD-10-CM | POA: Diagnosis present

## 2014-01-27 DIAGNOSIS — T82198A Other mechanical complication of other cardiac electronic device, initial encounter: Secondary | ICD-10-CM | POA: Diagnosis not present

## 2014-01-27 DIAGNOSIS — Z4502 Encounter for adjustment and management of automatic implantable cardiac defibrillator: Secondary | ICD-10-CM

## 2014-01-27 DIAGNOSIS — F3289 Other specified depressive episodes: Secondary | ICD-10-CM | POA: Diagnosis present

## 2014-01-27 DIAGNOSIS — T448X5A Adverse effect of centrally-acting and adrenergic-neuron-blocking agents, initial encounter: Secondary | ICD-10-CM | POA: Diagnosis not present

## 2014-01-27 DIAGNOSIS — I2589 Other forms of chronic ischemic heart disease: Secondary | ICD-10-CM | POA: Diagnosis present

## 2014-01-27 DIAGNOSIS — I509 Heart failure, unspecified: Secondary | ICD-10-CM | POA: Diagnosis present

## 2014-01-27 DIAGNOSIS — R112 Nausea with vomiting, unspecified: Secondary | ICD-10-CM | POA: Diagnosis not present

## 2014-01-27 DIAGNOSIS — D551 Anemia due to other disorders of glutathione metabolism: Secondary | ICD-10-CM | POA: Diagnosis not present

## 2014-01-27 DIAGNOSIS — I10 Essential (primary) hypertension: Secondary | ICD-10-CM | POA: Diagnosis present

## 2014-01-27 DIAGNOSIS — F411 Generalized anxiety disorder: Secondary | ICD-10-CM | POA: Diagnosis present

## 2014-01-27 DIAGNOSIS — I959 Hypotension, unspecified: Secondary | ICD-10-CM | POA: Diagnosis not present

## 2014-01-27 DIAGNOSIS — I4729 Other ventricular tachycardia: Principal | ICD-10-CM

## 2014-01-27 DIAGNOSIS — B1920 Unspecified viral hepatitis C without hepatic coma: Secondary | ICD-10-CM | POA: Diagnosis present

## 2014-01-27 DIAGNOSIS — I5023 Acute on chronic systolic (congestive) heart failure: Secondary | ICD-10-CM | POA: Diagnosis not present

## 2014-01-27 DIAGNOSIS — I5022 Chronic systolic (congestive) heart failure: Secondary | ICD-10-CM | POA: Diagnosis present

## 2014-01-27 DIAGNOSIS — F4310 Post-traumatic stress disorder, unspecified: Secondary | ICD-10-CM | POA: Diagnosis present

## 2014-01-27 DIAGNOSIS — I498 Other specified cardiac arrhythmias: Secondary | ICD-10-CM | POA: Diagnosis not present

## 2014-01-27 DIAGNOSIS — F1010 Alcohol abuse, uncomplicated: Secondary | ICD-10-CM | POA: Diagnosis present

## 2014-01-27 DIAGNOSIS — K219 Gastro-esophageal reflux disease without esophagitis: Secondary | ICD-10-CM | POA: Diagnosis present

## 2014-01-27 DIAGNOSIS — I4901 Ventricular fibrillation: Secondary | ICD-10-CM

## 2014-01-27 DIAGNOSIS — R55 Syncope and collapse: Secondary | ICD-10-CM | POA: Diagnosis not present

## 2014-01-27 DIAGNOSIS — Z95810 Presence of automatic (implantable) cardiac defibrillator: Secondary | ICD-10-CM | POA: Diagnosis present

## 2014-01-27 DIAGNOSIS — I499 Cardiac arrhythmia, unspecified: Secondary | ICD-10-CM | POA: Diagnosis not present

## 2014-01-27 DIAGNOSIS — T8141XA Infection following a procedure, superficial incisional surgical site, initial encounter: Secondary | ICD-10-CM

## 2014-01-27 DIAGNOSIS — F329 Major depressive disorder, single episode, unspecified: Secondary | ICD-10-CM | POA: Diagnosis present

## 2014-01-27 DIAGNOSIS — B20 Human immunodeficiency virus [HIV] disease: Secondary | ICD-10-CM | POA: Diagnosis not present

## 2014-01-27 DIAGNOSIS — R519 Headache, unspecified: Secondary | ICD-10-CM

## 2014-01-27 DIAGNOSIS — R1110 Vomiting, unspecified: Secondary | ICD-10-CM | POA: Diagnosis not present

## 2014-01-27 DIAGNOSIS — R51 Headache: Secondary | ICD-10-CM

## 2014-01-27 DIAGNOSIS — Z0389 Encounter for observation for other suspected diseases and conditions ruled out: Secondary | ICD-10-CM

## 2014-01-27 DIAGNOSIS — T8149XA Infection following a procedure, other surgical site, initial encounter: Secondary | ICD-10-CM

## 2014-01-27 LAB — CBC
HCT: 46.3 % (ref 39.0–52.0)
HEMATOCRIT: 47.2 % (ref 39.0–52.0)
Hemoglobin: 16.4 g/dL (ref 13.0–17.0)
Hemoglobin: 16.1 g/dL (ref 13.0–17.0)
MCH: 29.8 pg (ref 26.0–34.0)
MCH: 29.7 pg (ref 26.0–34.0)
MCHC: 34.7 g/dL (ref 30.0–36.0)
MCHC: 34.8 g/dL (ref 30.0–36.0)
MCV: 85.7 fL (ref 78.0–100.0)
MCV: 85.4 fL (ref 78.0–100.0)
Platelets: 246 10*3/uL (ref 150–400)
Platelets: 249 10*3/uL (ref 150–400)
RBC: 5.51 MIL/uL (ref 4.22–5.81)
RBC: 5.42 MIL/uL (ref 4.22–5.81)
RDW: 13.6 % (ref 11.5–15.5)
RDW: 13.6 % (ref 11.5–15.5)
WBC: 8.5 10*3/uL (ref 4.0–10.5)
WBC: 9.8 10*3/uL (ref 4.0–10.5)

## 2014-01-27 LAB — MRSA PCR SCREENING: MRSA BY PCR: NEGATIVE

## 2014-01-27 LAB — BASIC METABOLIC PANEL
Anion gap: 14 (ref 5–15)
BUN: 19 mg/dL (ref 6–23)
CO2: 25 mEq/L (ref 19–32)
Calcium: 9.8 mg/dL (ref 8.4–10.5)
Chloride: 100 mEq/L (ref 96–112)
Creatinine, Ser: 1.12 mg/dL (ref 0.50–1.35)
GFR calc Af Amer: 87 mL/min — ABNORMAL LOW (ref >90)
GFR calc non Af Amer: 75 mL/min — ABNORMAL LOW (ref >90)
Glucose, Bld: 98 mg/dL (ref 70–99)
POTASSIUM: 3.8 meq/L (ref 3.7–5.3)
Sodium: 139 mEq/L (ref 137–147)

## 2014-01-27 LAB — I-STAT TROPONIN, ED: Troponin i, poc: 0.04 ng/mL (ref 0.00–0.08)

## 2014-01-27 LAB — CALCIUM: Calcium: 9.2 mg/dL (ref 8.4–10.5)

## 2014-01-27 LAB — CREATININE, SERUM
Creatinine, Ser: 0.92 mg/dL (ref 0.50–1.35)
GFR calc Af Amer: >90 mL/min (ref >90)
GFR calc non Af Amer: >90 mL/min (ref >90)

## 2014-01-27 LAB — MAGNESIUM
Magnesium: 2.1 mg/dL (ref 1.5–2.5)
Magnesium: 2 mg/dL (ref 1.5–2.5)

## 2014-01-27 LAB — PRO B NATRIURETIC PEPTIDE: Pro B Natriuretic peptide (BNP): 953.7 pg/mL — ABNORMAL HIGH (ref 0–125)

## 2014-01-27 MED ORDER — LISINOPRIL 40 MG PO TABS
40.0000 mg | ORAL_TABLET | Freq: Every day | ORAL | Status: DC
  Filled 2014-01-27: qty 1

## 2014-01-27 MED ORDER — LORAZEPAM 2 MG/ML IJ SOLN
0.5000 mg | Freq: Once | INTRAMUSCULAR | Status: AC
  Administered 2014-01-27: 0.5 mg via INTRAVENOUS
  Filled 2014-01-27: qty 1

## 2014-01-27 MED ORDER — AMIODARONE HCL IN DEXTROSE 360-4.14 MG/200ML-% IV SOLN
60.0000 mg/h | INTRAVENOUS | Status: DC
  Administered 2014-01-27 (×2): 60 mg/h via INTRAVENOUS
  Filled 2014-01-27: qty 200

## 2014-01-27 MED ORDER — DOLUTEGRAVIR SODIUM 50 MG PO TABS
50.0000 mg | ORAL_TABLET | Freq: Every day | ORAL | Status: DC
  Administered 2014-01-28 – 2014-02-07 (×10): 50 mg via ORAL
  Filled 2014-01-27 (×12): qty 1

## 2014-01-27 MED ORDER — AMIODARONE HCL IN DEXTROSE 360-4.14 MG/200ML-% IV SOLN
INTRAVENOUS | Status: AC
  Administered 2014-01-27: 200 mL
  Filled 2014-01-27: qty 200

## 2014-01-27 MED ORDER — LIDOCAINE IN D5W 4-5 MG/ML-% IV SOLN
1.0000 mg/min | INTRAVENOUS | Status: DC
  Administered 2014-01-27 – 2014-01-29 (×2): 1 mg/min via INTRAVENOUS
  Filled 2014-01-27 (×2): qty 250
  Filled 2014-01-27: qty 500

## 2014-01-27 MED ORDER — HYDROMORPHONE HCL PF 1 MG/ML IJ SOLN
1.0000 mg | Freq: Once | INTRAMUSCULAR | Status: AC
  Administered 2014-01-27: 1 mg via INTRAVENOUS
  Filled 2014-01-27: qty 1

## 2014-01-27 MED ORDER — ENOXAPARIN SODIUM 40 MG/0.4ML ~~LOC~~ SOLN
40.0000 mg | SUBCUTANEOUS | Status: DC
  Administered 2014-01-27 – 2014-02-06 (×11): 40 mg via SUBCUTANEOUS
  Filled 2014-01-27 (×12): qty 0.4

## 2014-01-27 MED ORDER — AMIODARONE HCL IN DEXTROSE 360-4.14 MG/200ML-% IV SOLN: 60.0000 mg/h | INTRAVENOUS | Status: DC

## 2014-01-27 MED ORDER — AMIODARONE HCL IN DEXTROSE 360-4.14 MG/200ML-% IV SOLN
30.0000 mg/h | INTRAVENOUS | Status: DC
  Administered 2014-02-02: 30 mg/h via INTRAVENOUS

## 2014-01-27 MED ORDER — ONDANSETRON HCL 4 MG/2ML IJ SOLN
4.0000 mg | Freq: Once | INTRAMUSCULAR | Status: AC
  Administered 2014-01-27: 4 mg via INTRAVENOUS
  Filled 2014-01-27: qty 2

## 2014-01-27 MED ORDER — ACETAMINOPHEN 325 MG PO TABS
650.0000 mg | ORAL_TABLET | ORAL | Status: DC | PRN
  Administered 2014-02-01 – 2014-02-05 (×6): 650 mg via ORAL
  Filled 2014-01-27 (×6): qty 2

## 2014-01-27 MED ORDER — VALACYCLOVIR HCL 500 MG PO TABS: 1000.0000 mg | ORAL_TABLET | Freq: Two times a day (BID) | ORAL | Status: DC | PRN

## 2014-01-27 MED ORDER — POTASSIUM CHLORIDE 20 MEQ/15ML (10%) PO LIQD: 20.0000 meq | Freq: Two times a day (BID) | ORAL | Status: DC

## 2014-01-27 MED ORDER — POTASSIUM CHLORIDE 20 MEQ PO PACK
20.0000 meq | PACK | Freq: Two times a day (BID) | ORAL | Status: DC
  Filled 2014-01-27: qty 1

## 2014-01-27 MED ORDER — COLCHICINE 0.6 MG PO TABS
0.6000 mg | ORAL_TABLET | Freq: Two times a day (BID) | ORAL | Status: DC
  Administered 2014-01-27 – 2014-02-05 (×17): 0.6 mg via ORAL
  Filled 2014-01-27 (×23): qty 1

## 2014-01-27 MED ORDER — ONDANSETRON HCL 4 MG/2ML IJ SOLN
4.0000 mg | Freq: Four times a day (QID) | INTRAMUSCULAR | Status: DC | PRN
  Administered 2014-01-27 – 2014-02-05 (×7): 4 mg via INTRAVENOUS
  Filled 2014-01-27 (×7): qty 2

## 2014-01-27 MED ORDER — AMIODARONE IV BOLUS ONLY 150 MG/100ML
150.0000 mg | Freq: Once | INTRAVENOUS | Status: AC
  Administered 2014-01-27: 150 mg via INTRAVENOUS
  Filled 2014-01-27 (×2): qty 100

## 2014-01-27 MED ORDER — AMIODARONE HCL IN DEXTROSE 360-4.14 MG/200ML-% IV SOLN
30.0000 mg/h | INTRAVENOUS | Status: DC
  Administered 2014-01-28 – 2014-01-30 (×4): 30 mg/h via INTRAVENOUS
  Administered 2014-01-30: 150 mg/h via INTRAVENOUS
  Administered 2014-01-30 – 2014-02-01 (×5): 30 mg/h via INTRAVENOUS
  Filled 2014-01-27 (×24): qty 200

## 2014-01-27 MED ORDER — POTASSIUM CHLORIDE CRYS ER 20 MEQ PO TBCR
20.0000 meq | EXTENDED_RELEASE_TABLET | ORAL | Status: AC
  Administered 2014-01-27 (×2): 20 meq via ORAL
  Filled 2014-01-27 (×2): qty 1

## 2014-01-27 MED ORDER — AMIODARONE HCL IN DEXTROSE 360-4.14 MG/200ML-% IV SOLN
60.0000 mg/h | INTRAVENOUS | Status: DC
  Administered 2014-01-27: 60 mg/h via INTRAVENOUS
  Filled 2014-01-27: qty 200

## 2014-01-27 MED ORDER — NITROGLYCERIN 0.4 MG SL SUBL: 0.4000 mg | SUBLINGUAL_TABLET | SUBLINGUAL | Status: DC | PRN

## 2014-01-27 MED ORDER — CARVEDILOL 25 MG PO TABS
25.0000 mg | ORAL_TABLET | Freq: Two times a day (BID) | ORAL | Status: DC
  Administered 2014-01-27 – 2014-01-28 (×2): 25 mg via ORAL
  Filled 2014-01-27 (×4): qty 1

## 2014-01-27 MED ORDER — LORAZEPAM 2 MG/ML IJ SOLN
1.0000 mg | Freq: Once | INTRAMUSCULAR | Status: AC
  Administered 2014-01-27: 1 mg via INTRAVENOUS
  Filled 2014-01-27: qty 1

## 2014-01-27 MED ORDER — SODIUM CHLORIDE 0.9 % IV BOLUS (SEPSIS)
250.0000 mL | Freq: Once | INTRAVENOUS | Status: AC
  Administered 2014-01-27: 250 mL via INTRAVENOUS

## 2014-01-27 MED ORDER — AMIODARONE LOAD VIA INFUSION
150.0000 mg | Freq: Once | INTRAVENOUS | Status: AC
  Administered 2014-01-27: 150 mg via INTRAVENOUS
  Filled 2014-01-27: qty 83.34

## 2014-01-27 MED ORDER — ALPRAZOLAM 0.5 MG PO TABS
0.5000 mg | ORAL_TABLET | Freq: Three times a day (TID) | ORAL | Status: DC | PRN
  Administered 2014-01-27 – 2014-02-05 (×12): 0.5 mg via ORAL
  Filled 2014-01-27 (×12): qty 1

## 2014-01-27 MED ORDER — MAGNESIUM SULFATE IN D5W 10-5 MG/ML-% IV SOLN
1.0000 g | Freq: Once | INTRAVENOUS | Status: AC
  Administered 2014-01-28: 1 g via INTRAVENOUS
  Filled 2014-01-27: qty 100

## 2014-01-27 MED ORDER — DIGOXIN 250 MCG PO TABS
0.2500 mg | ORAL_TABLET | Freq: Every day | ORAL | Status: DC
  Administered 2014-01-27 – 2014-01-29 (×3): 0.25 mg via ORAL
  Filled 2014-01-27 (×3): qty 1

## 2014-01-27 MED ORDER — SODIUM CHLORIDE 0.9 % IV SOLN
INTRAVENOUS | Status: DC
  Administered 2014-01-27: 16:00:00 via INTRAVENOUS

## 2014-01-27 MED ORDER — LORAZEPAM 2 MG/ML IJ SOLN
0.5000 mg | INTRAMUSCULAR | Status: DC
  Administered 2014-01-27: 0.5 mg via INTRAVENOUS
  Filled 2014-01-27: qty 1

## 2014-01-27 MED ORDER — EMTRICITAB-RILPIVIR-TENOFOV DF 200-25-300 MG PO TABS
1.0000 | ORAL_TABLET | Freq: Every day | ORAL | Status: DC
  Filled 2014-01-27: qty 1

## 2014-01-27 NOTE — ED Provider Notes (Signed)
CSN: [id number]     Arrival date & time 01/27/14  1348 History  This chart was scribed for Vanetta Mulders, MD by Jarvis Morgan, ED Scribe. This patient was seen in room B14C/B14C and the patient's care was started at 2:15 PM.    Chief Complaint  Patient presents with  . Irregular Heart Beat  . Loss of Consciousness     Patient is a 48 y.o. male presenting with syncope. The history is provided by the patient and the EMS personnel. No language interpreter was used.  Loss of Consciousness Episode history:  Single Most recent episode:  Today Timing:  Unable to specify Progression:  Unable to specify Chronicity:  New Context comment:  Heart defibrilator shock Witnessed: no   Relieved by:  None tried Worsened by:  Nothing tried Ineffective treatments:  None tried Associated symptoms: no chest pain, no confusion, no fever, no headaches, no nausea, no shortness of breath and no vomiting   Risk factors: congenital heart disease    HPI Comments: [name] is a 48 y.o. male with a h/o HTN, HIV infection, Hepatitis, G6PD deficiency, chronic systolic heart failure, NICM, CHF, GERD and depression, brought in by ambulance who presents to the Emergency Department complaining of an irregular heart beat and syncope episode. Pt was brought from home by EMS. He states he was outside when it happened and he woke up with his face in the dirt. No witnesses to the syncope episode but pt believes it was not long because he was listening to a song and states that same song was still on. He had his pacemaker defibrilator placed on January 2015. He reports that since the defibrilator was placed he has felt it shock him 4x in January, multiple times in July and August 7th. He states that the August 7th episode he had not felt the shock but had been called and told it had gone off. He notes that last time he felt the defibrilator shock was an episode on July 27th. The defibrilator has fired 1x time today per  pt. He went to see Dr. Ladona Ridgel on Wednesday, August 26th and was put on Sotalol 120 mg 2x daily. He denies any chest pain, shortness of breath or nausea.  Dr. Juline Patch Cardiology  Past Medical History  Diagnosis Date  . Hypertension   . HIV infection   . Hepatitis     Hep B and Hep C (patient does not report this but these are listed in previous notes.)  . G6PD deficiency   . Chronic systolic heart failure     a. Echo 12/05/11:  EF 20-25%, diff HK with mild sparing of IL wall, mild AI, mod MR, mild LAE, mild RVE, mild reduced RVF.;  b.  Echo 05/11/2012:  Mild LVH, EF 20-25%, Gr 1 diast dysfn, mod MR, mild LAE  . NICM (nonischemic cardiomyopathy)     cardia CTA 8/13 negative for obstructive CAD  . CHF (congestive heart failure)   . GERD (gastroesophageal reflux disease)   . Depression    Past Surgical History  Procedure Laterality Date  . Finger surgery      Thumb laceration.    . Esophagogastroduodenoscopy  12/07/2011    Procedure: ESOPHAGOGASTRODUODENOSCOPY (EGD);  Surgeon: [name], MD,FACG;  Location: [facility] ENDOSCOPY;  Service: Endoscopy;  Laterality: N/A;   Family History  Problem Relation Age of Onset  . Cancer Mother    History  Substance Use Topics  . Smoking status: Never Smoker   .  Smokeless tobacco: Never Used  . Alcohol Use: 0.6 oz/week    1 Cans of beer per week     Comment: cutting back     Review of Systems  Constitutional: Negative for fever and chills.  HENT: Negative for congestion, rhinorrhea and sore throat.   Eyes: Negative for visual disturbance.  Respiratory: Negative for cough and shortness of breath.   Cardiovascular: Positive for syncope. Negative for chest pain and leg swelling.  Gastrointestinal: Negative for nausea, vomiting, abdominal pain and diarrhea.  Genitourinary: Negative for dysuria.  Musculoskeletal: Positive for back pain.  Skin: Negative for rash.  Neurological: Positive for syncope. Negative for headaches.  Hematological:  Does not bruise/bleed easily.  Psychiatric/Behavioral: Negative for confusion.  All other systems reviewed and are negative.     Allergies  Bactrim  Home Medications   Prior to Admission medications   Medication Sig Start Date End Date Taking? Authorizing Provider  carvedilol (COREG) 12.5 MG tablet Take 1.5 tablets (18.75 mg total) by mouth 2 (two) times daily with a meal. 10/05/13  Yes Marinus Maw, MD  colchicine 0.6 MG tablet Take 1 tablet (0.6 mg total) by mouth 2 (two) times daily. 04/13/13  Yes [name], MD  digoxin (LANOXIN) 0.25 MG tablet Take 1 tablet (0.25 mg total) by mouth daily. 06/21/13  Yes Bevelyn Buckles Bensimhon, MD  dolutegravir (TIVICAY) 50 MG tablet Take 50 mg by mouth daily with lunch.   Yes Historical Provider, MD  Emtricitab-Rilpivir-Tenofovir 200-25-300 MG TABS Take 1 tablet by mouth daily with lunch.   Yes Historical Provider, MD  lisinopril (PRINIVIL,ZESTRIL) 40 MG tablet Take 1 tablet (40 mg total) by mouth daily. 12/20/12  Yes [name], MD  valACYclovir (VALTREX) 1000 MG tablet Take 1,000 mg by mouth 2 (two) times daily as needed (outbreaks). Take for 5 days for an outbreak 04/07/12  Yes [name], MD   Triage Vitals: BP 166/92  Pulse 32  Temp(Src) 98.3 F (36.8 C) (Oral)  Resp 18  SpO2 97%  Physical Exam  Nursing note and vitals reviewed. Constitutional: He is oriented to person, place, and time. He appears well-developed and well-nourished. No distress.  HENT:  Head: Normocephalic and atraumatic.  Mouth/Throat: Oropharynx is clear and moist and mucous membranes are normal.  Eyes: Conjunctivae and EOM are normal. Pupils are equal, round, and reactive to light.  Sclera clear  Neck: Neck supple. No tracheal deviation present.  Cardiovascular: Normal rate, regular rhythm and normal heart sounds.   Pace maker defib pockets in LU chest No swelling in ankles  Pulmonary/Chest: Effort normal and breath sounds normal. No respiratory distress.   Abdominal: Soft. Bowel sounds are normal. There is no tenderness.  Musculoskeletal: Normal range of motion.  Moves all extremities w/o diffculty  Neurological: He is alert and oriented to person, place, and time. No cranial nerve deficit. He exhibits normal muscle tone. Coordination normal.  Skin: Skin is warm and dry.  Psychiatric: He has a normal mood and affect. His behavior is normal.    ED Course  Procedures (including critical care time)  DIAGNOSTIC STUDIES: Oxygen Saturation is 97% on RA, normal by my interpretation.    COORDINATION OF CARE: 2:30 PM EKG interpretation shows  Polymorphic ventricular tachycardia. Shapes similar to torsad   Results for orders placed during the hospital encounter of 01/27/14  CBC      Result Value Ref Range   WBC 8.5  4.0 - 10.5 K/uL   RBC 5.51  4.22 -  5.81 MIL/uL   Hemoglobin 16.4  13.0 - 17.0 g/dL   HCT 15.3  79.4 - 32.7 %   MCV 85.7  78.0 - 100.0 fL   MCH 29.8  26.0 - 34.0 pg   MCHC 34.7  30.0 - 36.0 g/dL   RDW 61.4  70.9 - 29.5 %   Platelets 246  150 - 400 K/uL  BASIC METABOLIC PANEL      Result Value Ref Range   Sodium 139  137 - 147 mEq/L   Potassium 3.8  3.7 - 5.3 mEq/L   Chloride 100  96 - 112 mEq/L   CO2 25  19 - 32 mEq/L   Glucose, Bld 98  70 - 99 mg/dL   BUN 19  6 - 23 mg/dL   Creatinine, Ser 7.47  0.50 - 1.35 mg/dL   Calcium 9.8  8.4 - 34.0 mg/dL   GFR calc non Af Amer 75 (*) >90 mL/min   GFR calc Af Amer 87 (*) >90 mL/min   Anion gap 14  5 - 15  MAGNESIUM      Result Value Ref Range   Magnesium 2.1  1.5 - 2.5 mg/dL  I-STAT TROPOININ, ED      Result Value Ref Range   Troponin i, poc 0.04  0.00 - 0.08 ng/mL   Comment 3            Dg Chest Portable 1 View  01/27/2014   CLINICAL DATA:  Irregular heartbeat.  Loss of consciousness.  EXAM: PORTABLE CHEST - 1 VIEW  COMPARISON:  06/27/2013.  FINDINGS: Mediastinum and hilar structures normal. Cardiomegaly. Cardiac pacer with lead tips in right atrium and right ventricle. No  CHF. No focal infiltrate. No pleural effusion or pneumothorax. No acute bony abnormality.  IMPRESSION: 1. Stable cardiomegaly.  Cardiac pacer.  No CHF. 2. No acute pulmonary disease.   Electronically Signed   By: Maisie Fus  Register   On: 01/27/2014 14:46      Medications  0.9 %  sodium chloride infusion ( Intravenous New Bag/Given 01/27/14 1540)  amiodarone (NEXTERONE) 1.8 mg/mL load via infusion 150 mg (150 mg Intravenous Bolus from Bag 01/27/14 1527)    And  amiodarone (NEXTERONE PREMIX) 360 MG/200ML (1.8 mg/mL) IV infusion (60 mg/hr Intravenous New Bag/Given 01/27/14 1527)    And  amiodarone (NEXTERONE PREMIX) 360 MG/200ML (1.8 mg/mL) IV infusion (not administered)  sodium chloride 0.9 % bolus 250 mL (250 mLs Intravenous New Bag/Given 01/27/14 1539)  ondansetron (ZOFRAN) injection 4 mg (4 mg Intravenous Given 01/27/14 1535)  HYDROmorphone (DILAUDID) injection 1 mg (1 mg Intravenous Given 01/27/14 1535)  LORazepam (ATIVAN) injection 1 mg (1 mg Intravenous Given 01/27/14 1530)  amiodarone (NEXTERONE PREMIX) 360 MG/200ML (1.8 mg/mL) IV infusion (200 mLs  New Bag/Given 01/27/14 1604)  amiodarone (NEXTERONE) IV bolus only 150 mg/100 mL (150 mg Intravenous Bolus from Bag 01/27/14 1606)    CRITICAL CARE Performed by: Vanetta Mulders Total critical care time: 30 Critical care time was exclusive of separately billable procedures and treating other patients. Critical care was necessary to treat or prevent imminent or life-threatening deterioration. Critical care was time spent personally by me on the following activities: development of treatment plan with patient and/or surrogate as well as nursing, discussions with consultants, evaluation of patient's response to treatment, examination of patient, obtaining history from patient or surrogate, ordering and performing treatments and interventions, ordering and review of laboratory studies, ordering and review of radiographic studies, pulse oximetry and  re-evaluation of patient's condition.  Date: 01/27/2014  Rate: 54  Rhythm: sinus bradycardia  QRS Axis: normal  Intervals: normal  ST/T Wave abnormalities: nonspecific ST/T changes  Conduction Disutrbances:nonspecific intraventricular conduction delay  Narrative Interpretation:   Old EKG Reviewed: none available    MDM   Final diagnoses:  Ventricular tachycardia, sustained  Cardiomyopathy  Chronic systolic heart failure  Implantable cardioverter-defibrillator (ICD) discharge  Paroxysmal ventricular fibrillation  Unspecified essential hypertension  Ventricular fibrillation  Human immunodeficiency virus (HIV) disease  ICD (implantable cardioverter-defibrillator), biventricular, in situ  Sustained VT (ventricular tachycardia)   Patient with defibrillator pacemaker that was implanted in January. Patient had a syncopal episode today and was shocked. Patient actually had 3 shocking to prior to arrival was only wear one. Caregiver the pacemaker it did show confirmed shocking for V. fib also showed some runs of V. tach. One clot on her monitor tape year. Patient denies any chest pain or shortness of breath. Patient recently started on the beta blocker Sortalol 120 mg just this week to help control of the rhythm. Patient was having other episodes of being shocked and not aware of it. Discussed with cardiology. Eventually get approval to go with amiodarone. Patient was shocked here again. The runs of V. tach that we got on the sheet drip from [facility] was polymorphic ventricular tachycardia. Some hint of torsades but not classic. This well we held on going with Amiodorone but was given clearance from cardiology to start. Patient's vital signs have been stable. Patient treated with some Ativan some pain medicine Zofran to help with the discomfort from the shocks. Cardiology to admit.  I personally performed the services described in this documentation, which was scribed in my presence. The  recorded information has been reviewed and is accurate.      Vanetta Mulders, MD 01/27/14 1623

## 2014-01-27 NOTE — Progress Notes (Signed)
UR Completed.  [name] [phone] 01/27/2014  

## 2014-01-27 NOTE — ED Notes (Signed)
Pt with run VT and defib x 1 with syncope; admitting phy aware

## 2014-01-27 NOTE — ED Notes (Signed)
Assisted pt back from restroom . Pt monitored by blood pressure, pulse ox, and 12 lead.

## 2014-01-27 NOTE — Progress Notes (Signed)
Cards fellow paged, pt had his icd fired. Pt does not remember the event, but stated felt it coming on. Will continue to monitor.

## 2014-01-27 NOTE — ED Notes (Signed)
Pt noted to have VT and had syncopal episode with defib firing x 1 witnessed by this RN; pt returned to baseline approx 5 seconds after defib

## 2014-01-27 NOTE — ED Notes (Signed)
EDP at bedside  

## 2014-01-27 NOTE — ED Notes (Addendum)
Per EMS: pt from home with syncope today and defib fired x 1; pt with episode of vtach in route without shock; pt denies pain but sts "not feeling well" 20g L AC; pt sts some diarrhea recently

## 2014-01-27 NOTE — Progress Notes (Signed)
Dr.Jacob Tresa Endo made aware of VT with AICD firing. VT rate at 300, Orders received and carried out with Amiodarone 150mg  bolus adm.. Continue to monitor closely

## 2014-01-27 NOTE — ED Notes (Signed)
Pt started on sotolol on Wednesday

## 2014-01-27 NOTE — Progress Notes (Addendum)
Brief X-Cover Note  Called by Mr. Esham's nurse for ICD firing for fast VT.  He is admitted with VT storm and 8 ICD firings (now 9) previously on sotalol who is being loaded with amiodarone now. He has known EF of 25%, non-ischemic cardiomyopathy ICD implanted 01/15 and HIV on HAART therapy. He is currently hemodynamically stable. BP 133/86  Pulse 26  Temp(Src) 98.3 F (36.8 C) (Oral)  Resp 8  SpO2 99%. Discussed with pharmacy also regarding HAART therapy. Will re-bolus amiodarone if another ICD firing and consider another agent if refractory (lidocaine).   Leeann Must, MD  Addendum  Mr. Paulsen had 2 more incidents of ICD firing overnight. Anxiolytics used with some improvement, addition of lidocaine gtt added as well with lowering of amiodarone gtt. He remains hemodynamically stable. EP consult today and VT ablation can be considered. His rhythm has been quiet since early AM and he's been sleeping comfortably for a few hours.   Leeann Must, MD

## 2014-01-27 NOTE — H&P (Addendum)
Patient ID: [name] MRN: [id number], DOB/AGE: 50-Aug-1967   Admit date: 01/27/2014  Primary Physician: [name], MD Primary Cardiologist: Dr. Lewayne Bunting  Pt. Profile:  ICD firing, syncope  Problem List  Past Medical History  Diagnosis Date  . Hypertension   . HIV infection   . Hepatitis     Hep B and Hep C (patient does not report this but these are listed in previous notes.)  . G6PD deficiency   . Chronic systolic heart failure     a. Echo 12/05/11:  EF 20-25%, diff HK with mild sparing of IL wall, mild AI, mod MR, mild LAE, mild RVE, mild reduced RVF.;  b.  Echo 05/11/2012:  Mild LVH, EF 20-25%, Gr 1 diast dysfn, mod MR, mild LAE  . NICM (nonischemic cardiomyopathy)     cardia CTA 8/13 negative for obstructive CAD  . CHF (congestive heart failure)   . GERD (gastroesophageal reflux disease)   . Depression     Past Surgical History  Procedure Laterality Date  . Finger surgery      Thumb laceration.    . Esophagogastroduodenoscopy  12/07/2011    Procedure: ESOPHAGOGASTRODUODENOSCOPY (EGD);  Surgeon: [name], MD,FACG;  Location: [facility] ENDOSCOPY;  Service: Endoscopy;  Laterality: N/A;     Allergies  Allergies  Allergen Reactions  . Bactrim Rash    HPI  Patient is a 48 y.o. male with a PMHx of non-ischemic CM, LVEF 25%, LBBB and chronic systolic heart failure, s/p ICD implant in January 2015. The patient was last seen by Dr Ladona Ridgel on 01/23/14 for several ICD shocks secondary to ventricular tachycardia, which degenerated into ventricular fibrillation. He was started on Sotalol 120 mg po BID. At the time he had no angina or heart failure symptoms. His heart failure is currently class IIA.   The patient is coming today for syncope and ICD firing. Upon interrogation of his ICD there were total of 3 episodes of VF today and he was shocked for it. There were also episodes of VT that he was not shocked for. Interrogation also showed 2 VF episodes on 8/27 that  he was not aware of.  He has another ICD firing while in the ER. He denies chest pain or SOB.   Home Medications  Prior to Admission medications   Medication Sig Start Date End Date Taking? Authorizing Provider  carvedilol (COREG) 12.5 MG tablet Take 1.5 tablets (18.75 mg total) by mouth 2 (two) times daily with a meal. 10/05/13  Yes Marinus Maw, MD  colchicine 0.6 MG tablet Take 1 tablet (0.6 mg total) by mouth 2 (two) times daily. 04/13/13  Yes [name], MD  digoxin (LANOXIN) 0.25 MG tablet Take 1 tablet (0.25 mg total) by mouth daily. 06/21/13  Yes Bevelyn Buckles Bensimhon, MD  dolutegravir (TIVICAY) 50 MG tablet Take 50 mg by mouth daily with lunch.   Yes Historical Provider, MD  Emtricitab-Rilpivir-Tenofovir 200-25-300 MG TABS Take 1 tablet by mouth daily with lunch.   Yes Historical Provider, MD  furosemide (LASIX) 40 MG tablet Take 1 tablet (40 mg total) by mouth 2 (two) times daily. 11/03/13  Yes [name], NP  lisinopril (PRINIVIL,ZESTRIL) 40 MG tablet Take 1 tablet (40 mg total) by mouth daily. 12/20/12  Yes [name], MD  sotalol (BETAPACE) 120 MG tablet Take 120 mg by mouth 2 (two) times daily.   Yes Historical Provider, MD  valACYclovir (VALTREX) 1000 MG tablet Take 1,000 mg by  mouth 2 (two) times daily as needed (outbreaks). Take for 5 days for an outbreak 04/07/12  Yes [name], MD    Family History  Family History  Problem Relation Age of Onset  . Cancer Mother     Social History  History   Social History  . Marital Status: Single    Spouse Name: N/A    Number of Children: N/A  . Years of Education: N/A   Occupational History  . Unemployed    Social History Main Topics  . Smoking status: Never Smoker   . Smokeless tobacco: Never Used  . Alcohol Use: 0.6 oz/week    1 Cans of beer per week     Comment: cutting back   . Drug Use: No     Comment: marijuana daily  . Sexual Activity: Yes    Partners: Male     Comment: 28 year relationship, declined  condoms   Other Topics Concern  . Not on file   Social History Narrative   Lives alone.  Drinks beer daily.  Liquor rarely.  He occasionally smokes marijuana..     Review of Systems General:  No chills, fever, night sweats or weight changes.  Cardiovascular:  No chest pain, dyspnea on exertion, edema, orthopnea, palpitations, paroxysmal nocturnal dyspnea. Dermatological: No rash, lesions/masses Respiratory: No cough, dyspnea Urologic: No hematuria, dysuria Abdominal:   No nausea, vomiting, diarrhea, bright red blood per rectum, melena, or hematemesis Neurologic:  No visual changes, wkns, changes in mental status. All other systems reviewed and are otherwise negative except as noted above.  Physical Exam  Blood pressure 153/108, pulse 65, temperature 98.3 F (36.8 C), temperature source Oral, resp. rate 13, SpO2 97.00%.  General: Pleasant, NAD Psych: Normal affect. Neuro: Alert and oriented X 3. Moves all extremities spontaneously. HEENT: Normal  Neck: Supple without bruits or JVD. Lungs:  Resp regular and unlabored, CTA. Heart: RRR no s3, s4, or murmurs. Abdomen: Soft, non-tender, non-distended, BS + x 4.  Extremities: No clubbing, cyanosis or edema. DP/PT/Radials 2+ and equal bilaterally.  Labs  No results found for this basename: CKTOTAL, CKMB, TROPONINI,  in the last 72 hours Lab Results  Component Value Date   WBC 9.1 06/13/2013   HGB 14.4 06/13/2013   HCT 43.5 06/13/2013   MCV 86.8 06/13/2013   PLT 266.0 06/13/2013   No results found for this basename: NA, K, CL, CO2, BUN, CREATININE, CALCIUM, LABALBU, PROT, BILITOT, ALKPHOS, ALT, AST, GLUCOSE,  in the last 168 hours Lab Results  Component Value Date   CHOL 123 01/21/2013   HDL 36* 01/21/2013   LDLCALC 64 01/21/2013   TRIG 113 01/21/2013   Lab Results  Component Value Date   DDIMER 1.35* 01/19/2013   No components found with this basename: POCBNP,    Radiology/Studies  Dg Chest Portable 1 View  01/27/2014    CLINICAL DATA:  Irregular heartbeat.  Loss of consciousness.  EXAM: PORTABLE CHEST - 1 VIEW  COMPARISON:  06/27/2013.  FINDINGS: Mediastinum and hilar structures normal. Cardiomegaly. Cardiac pacer with lead tips in right atrium and right ventricle. No CHF. No focal infiltrate. No pleural effusion or pneumothorax. No acute bony abnormality.  IMPRESSION: 1. Stable cardiomegaly.  Cardiac pacer.  No CHF. 2. No acute pulmonary disease.   Electronically Signed   By: Maisie Fus  Register   On: 01/27/2014 14:46   Echocardiogram - 10/16/2013 Left ventricle: The cavity size was severely dilated. Wall thickness was increased in a pattern of mild LVH.  The estimated ejection fraction was 25%. Diffuse hypokinesis. Findings consistent with left ventricular diastolic dysfunction. - Aortic valve: Sclerosis without stenosis. There was trivial regurgitation. - Mitral valve: There was moderate regurgitation. - Left atrium: The atrium was moderately dilated. - Right ventricle: The cavity size was normal. Pacer wire or catheter noted in right ventricle. Systolic function was normal. - Pulmonary arteries: PA peak pressure: 40 mm Hg (S).  ECG: Sinus bradycardia, LVH, repolarization abnormalities, QT/QTc 480/455 ms    ASSESSMENT AND PLAN  48 year old male with known non-ischemic CM, LVEF 25%, LBBB and chronic systolic heart failure, s/p ICD implant in January 2015   1. VT storm - 8 ICD firing in the last 2 days, Sotalol ineffective,  - we will discontinue Sotalol - start amiodarone load followed by amiodarone drip. - EP consult in the am - check K, Mg, Ca  2. Ischemic CMP, chronic systolic CHF - appears euvolemic  3. HTN - increase carvedilol to 25 mg po BID  4. HIV - continue home dose of HAART   Signed, Lars Masson, MD, [facility] 01/27/2014, 3:13 PM

## 2014-01-27 NOTE — Progress Notes (Signed)
eLink Physician-Brief Progress Note Patient Name: [name] DOB: 07/30/1965 MRN: [id number]   Date of Service  01/27/2014  HPI/Events of Note  Patient with known nonischemic cardiomyopathy, AICD in place, presented with syncope.  Interrogation of AICD revealed several recent ventricular arrythmias.  Patient admitted to cardiology service and started on amiodarone drip.  Potassium is 3.8 and magnesium is 2.1.  No evidence of CHF on cxr.  Patient recently placed on sotalol which has been stopped.  Other medical problems include HIV.  eICU Interventions  No interventions at this time orders reviewed.     Intervention Category Evaluation Type: New Patient Evaluation  Henry Russel, Demetrius Charity 01/27/2014, 6:19 PM

## 2014-01-28 LAB — BASIC METABOLIC PANEL
ANION GAP: 12 (ref 5–15)
BUN: 14 mg/dL (ref 6–23)
CALCIUM: 9.2 mg/dL (ref 8.4–10.5)
CO2: 25 mEq/L (ref 19–32)
CREATININE: 1.07 mg/dL (ref 0.50–1.35)
Chloride: 102 mEq/L (ref 96–112)
GFR calc Af Amer: >90 mL/min (ref >90)
GFR, EST NON AFRICAN AMERICAN: 79 mL/min — AB (ref >90)
Glucose, Bld: 101 mg/dL — ABNORMAL HIGH (ref 70–99)
Potassium: 3.8 mEq/L (ref 3.7–5.3)
Sodium: 139 mEq/L (ref 137–147)

## 2014-01-28 LAB — CALCIUM, IONIZED: Calcium, Ion: 1.32 mmol/L — ABNORMAL HIGH (ref 1.12–1.23)

## 2014-01-28 MED ORDER — RILPIVIRINE HCL 25 MG PO TABS
25.0000 mg | ORAL_TABLET | Freq: Every day | ORAL | Status: DC
  Administered 2014-01-28 – 2014-02-07 (×9): 25 mg via ORAL
  Filled 2014-01-28 (×11): qty 1

## 2014-01-28 MED ORDER — EMTRICITABINE-TENOFOVIR DF 200-300 MG PO TABS
1.0000 | ORAL_TABLET | Freq: Every day | ORAL | Status: DC
  Administered 2014-01-28 – 2014-02-07 (×10): 1 via ORAL
  Filled 2014-01-28 (×11): qty 1

## 2014-01-28 MED ORDER — ZOLPIDEM TARTRATE 5 MG PO TABS
10.0000 mg | ORAL_TABLET | Freq: Every evening | ORAL | Status: DC | PRN
  Administered 2014-01-28 – 2014-02-06 (×9): 10 mg via ORAL
  Filled 2014-01-28 (×10): qty 2

## 2014-01-28 MED ORDER — CARVEDILOL 12.5 MG PO TABS: 12.5000 mg | ORAL_TABLET | Freq: Two times a day (BID) | ORAL | Status: DC

## 2014-01-28 MED ORDER — POTASSIUM CHLORIDE CRYS ER 20 MEQ PO TBCR
20.0000 meq | EXTENDED_RELEASE_TABLET | Freq: Once | ORAL | Status: AC
  Administered 2014-01-28: 20 meq via ORAL
  Filled 2014-01-28: qty 1

## 2014-01-28 MED ORDER — CARVEDILOL 25 MG PO TABS
25.0000 mg | ORAL_TABLET | Freq: Two times a day (BID) | ORAL | Status: DC
  Administered 2014-01-28 – 2014-02-07 (×17): 25 mg via ORAL
  Filled 2014-01-28 (×23): qty 1

## 2014-01-28 MED ORDER — LORAZEPAM 2 MG/ML IJ SOLN
0.5000 mg | INTRAMUSCULAR | Status: DC | PRN
  Administered 2014-01-30 – 2014-02-04 (×3): 0.5 mg via INTRAVENOUS
  Filled 2014-01-28 (×3): qty 1

## 2014-01-28 MED ORDER — LIDOCAINE BOLUS VIA INFUSION
75.0000 mg | Freq: Once | INTRAVENOUS | Status: AC
  Administered 2014-01-28: 75 mg via INTRAVENOUS

## 2014-01-28 NOTE — Consult Note (Signed)
Electrophysiology consultation note  Reason for Consult:Ventricular tachycardia storm  Referring Physician: Dr. Lily Lovings is an 48 y.o. male.   HPI: The patient is a 48 year old man with multiple medical problems including a nonischemic cardiomyopathy, chronic systolic heart failure, ejection fraction 25%, chronic HIV infection, hepatitis, and hypertension. He underwent ICD insertion several months ago after experiencing syncope and known left ventricular dysfunction. Over the last several weeks, he has had recurrent episodes of ventricular tachycardia, that quickly degenerates, requiring ICD shock. He presented to the emergency room yesterday with recurrent VT VF. Previously, the patient had had multiple shocks and had been placed on sotalol. This had no effect on his propensity for ventricular arrhythmia, and could possibly have been proarrhythmic, although it is uncertain. He was placed on intravenous amiodarone, and has had recurrent shocks, and was also placed on intravenous lidocaine. He has no coronary disease. He denies medical or dietary compliance problems.  PMH: Past Medical History  Diagnosis Date  . Hypertension   . HIV infection   . Hepatitis     Hep B and Hep C (patient does not report this but these are listed in previous notes.)  . G6PD deficiency   . Chronic systolic heart failure     a. Echo 12/05/11:  EF 20-25%, diff HK with mild sparing of IL wall, mild AI, mod MR, mild LAE, mild RVE, mild reduced RVF.;  b.  Echo 05/11/2012:  Mild LVH, EF 20-25%, Gr 1 diast dysfn, mod MR, mild LAE  . NICM (nonischemic cardiomyopathy)     cardia CTA 8/13 negative for obstructive CAD  . CHF (congestive heart failure)   . GERD (gastroesophageal reflux disease)   . Depression     PSHX: Past Surgical History  Procedure Laterality Date  . Finger surgery      Thumb laceration.    . Esophagogastroduodenoscopy  12/07/2011    Procedure: ESOPHAGOGASTRODUODENOSCOPY (EGD);   Surgeon: [name], MD,FACG;  Location: [facility] ENDOSCOPY;  Service: Endoscopy;  Laterality: N/A;    FAMHX: Family History  Problem Relation Age of Onset  . Cancer Mother     Social History:  reports that he has never smoked. He has never used smokeless tobacco. He reports that he drinks about .6 ounces of alcohol per week. He reports that he does not use illicit drugs.  Allergies:  Allergies  Allergen Reactions  . Bactrim Rash    Medications: medicine list reviewed  Dg Chest Portable 1 View  01/27/2014   CLINICAL DATA:  Irregular heartbeat.  Loss of consciousness.  EXAM: PORTABLE CHEST - 1 VIEW  COMPARISON:  06/27/2013.  FINDINGS: Mediastinum and hilar structures normal. Cardiomegaly. Cardiac pacer with lead tips in right atrium and right ventricle. No CHF. No focal infiltrate. No pleural effusion or pneumothorax. No acute bony abnormality.  IMPRESSION: 1. Stable cardiomegaly.  Cardiac pacer.  No CHF. 2. No acute pulmonary disease.   Electronically Signed   By: Maisie Fus  Register   On: 01/27/2014 14:46    ROS  As stated in the HPI and negative for all other systems.  Physical Exam  Vitals:Blood pressure 116/85, pulse 63, temperature 97.2 F (36.2 C), temperature source Oral, resp. rate 10, height 5\' 5"  (1.651 m), weight 154 lb 15.7 oz (70.3 kg), SpO2 96.00%.  Well appearing NAD HEENT: Unremarkable Neck:  No JVD, no thyromegally Lymphatics:  No adenopathy Back:  No CVA tenderness Lungs:  Clear HEART:  Regular rate rhythm, no murmurs, no rubs, no clicks Abd:  Flat, positive bowel sounds, no organomegally, no rebound, no guarding Ext:  2 plus pulses, no edema, no cyanosis, no clubbing Skin:  No rashes no nodules Neuro:  CN II through XII intact, motor grossly intact  ICD interrogation - multiple ICD shocks for ventricular fibrillation  Assessment/Plan: 1. VT/VF storm 2. Nonischemic cardiomyopathy 3. Chronic systolic heart failure with no obvious decompensation, ejection  fraction 20% 4. Chronic HIV infection Discussion: The patient will be maintained on intravenous amiodarone, and intravenous lidocaine. His ICD will be reprogrammed to although I doubt anti-tachycardic pacing therapies will have any effect on the treatment of his arrhythmias. He will be loaded in the hospital with intravenous amiodarone for approximately 72 hours, with transition off of his lidocaine and initiation of oral amiodarone therapy. If his arrhythmias do not improve, salvage catheter ablation would be a consideration. His overall prognosis is guarded. His electrolytes will be maintaining.  Sharlot Gowda TaylorMD 01/28/2014, 10:48 AM

## 2014-01-28 NOTE — Progress Notes (Signed)
AICD fired again for VT. Dr.J.Kelly made aware. Started continuous IV Lidocaine and will decrease IV Amiodarone to 30mg  in 30 minutes. Will give additional 48m5 mg IV Ativan for anxiety. VT rate at 300

## 2014-01-28 NOTE — Progress Notes (Addendum)
SUBJECTIVE:  Sleeping   OBJECTIVE:   Vitals:   Filed Vitals:   01/28/14 0400 01/28/14 0500 01/28/14 0600 01/28/14 0700  BP: 91/60 100/77 91/70 105/69  Pulse: 50 51 50 50  Temp:      TempSrc:      Resp:      Height:      Weight:      SpO2: 98% 96% 96% 95%   I&O's:   Intake/Output Summary (Last 24 hours) at 01/28/14 0819 Last data filed at 01/28/14 0700  Gross per 24 hour  Intake 2076.77 ml  Output    850 ml  Net 1226.77 ml   TELEMETRY: Reviewed telemetry pt in AV paced at 50     PHYSICAL EXAM General: Well developed, well nourished, in no acute distress Head: Eyes PERRLA, No xanthomas.   Normal cephalic and atramatic  Lungs:   Clear bilaterally to auscultation and percussion. Heart:   HRRR S1 S2 Pulses are 2+ & equal. Abdomen: Bowel sounds are positive, abdomen soft and non-tender without masses Extremities:   No clubbing, cyanosis or edema.  DP +1   LABS: Basic Metabolic Panel:  Recent Labs  16/10/96 1416 01/27/14 1435 01/27/14 1816 01/28/14 0629  NA 139  --   --  139  K 3.8  --   --  3.8  CL 100  --   --  102  CO2 25  --   --  25  GLUCOSE 98  --   --  101*  BUN 19  --   --  14  CREATININE 1.12  --  0.92 1.07  CALCIUM 9.8  --  9.2 9.2  MG  --  2.1 2.0  --    Liver Function Tests: No results found for this basename: AST, ALT, ALKPHOS, BILITOT, PROT, ALBUMIN,  in the last 72 hours No results found for this basename: LIPASE, AMYLASE,  in the last 72 hours CBC:  Recent Labs  01/27/14 1416 01/27/14 1816  WBC 8.5 9.8  HGB 16.4 16.1  HCT 47.2 46.3  MCV 85.7 85.4  PLT 246 249   Cardiac Enzymes: No results found for this basename: CKTOTAL, CKMB, CKMBINDEX, TROPONINI,  in the last 72 hours BNP: No components found with this basename: POCBNP,  D-Dimer: No results found for this basename: DDIMER,  in the last 72 hours Hemoglobin A1C: No results found for this basename: HGBA1C,  in the last 72 hours Fasting Lipid Panel: No results found for this  basename: CHOL, HDL, LDLCALC, TRIG, CHOLHDL, LDLDIRECT,  in the last 72 hours Thyroid Function Tests: No results found for this basename: TSH, T4TOTAL, FREET3, T3FREE, THYROIDAB,  in the last 72 hours Anemia Panel: No results found for this basename: VITAMINB12, FOLATE, FERRITIN, TIBC, IRON, RETICCTPCT,  in the last 72 hours Coag Panel:   Lab Results  Component Value Date   INR 0.96 02/12/2013   INR 1.0 02/11/2012   INR 0.6 06/28/2006    RADIOLOGY: Dg Chest Portable 1 View  01/27/2014   CLINICAL DATA:  Irregular heartbeat.  Loss of consciousness.  EXAM: PORTABLE CHEST - 1 VIEW  COMPARISON:  06/27/2013.  FINDINGS: Mediastinum and hilar structures normal. Cardiomegaly. Cardiac pacer with lead tips in right atrium and right ventricle. No CHF. No focal infiltrate. No pleural effusion or pneumothorax. No acute bony abnormality.  IMPRESSION: 1. Stable cardiomegaly.  Cardiac pacer.  No CHF. 2. No acute pulmonary disease.   Electronically Signed   By: Maisie Fus  Register   On:  01/27/2014 14:46   ASSESSMENT AND PLAN  48 year old male with known non-ischemic CM, LVEF 25%, LBBB and chronic systolic heart failure, s/p ICD implant in January 2015  1. VT storm - 8 ICD firing in the last 2 days, Sotalol ineffective and stopped.  He had 2 more ICD firings overnight and was started on Lidocaine.  No further episodes since Lidocaine initiated and given Mag - Continue amiodarone/Lidocaine drips.  - EP consult today - will give Kdur this am for K 3.8.  Mg level ok 2. Nonischemic CMP with negative cardiac coronary CTA for obstructive CAD 8/13 3. Chronic systolic CHF - appears euvolemic  4. HTN -  Now hypotensive after increasing carvedilol to 25 mg po BID.  Continue Coreg at current dose to help with VT suppression and hold Lisinopril for now 5. HIV - continue home dose of HAART 6. Syncope after AICD firing   Quintella Reichert, MD  01/28/2014  8:19 AM

## 2014-01-28 NOTE — Progress Notes (Signed)
VT storm again. AICD fired. Dr.J.Kelly made aware. Will give lidocaine bolus and preparing to give Mag.1gm bolus. Emotional support given to patient. Is very scared and anxious, His mate Onalee Hua is unable to get a ride to come back up here. Sitting in room with patient for support.

## 2014-01-29 ENCOUNTER — Encounter: Payer: Self-pay | Admitting: Internal Medicine

## 2014-01-29 LAB — BASIC METABOLIC PANEL
Anion gap: 14 (ref 5–15)
BUN: 14 mg/dL (ref 6–23)
CO2: 22 mEq/L (ref 19–32)
CREATININE: 1.15 mg/dL (ref 0.50–1.35)
Calcium: 8.9 mg/dL (ref 8.4–10.5)
Chloride: 104 mEq/L (ref 96–112)
GFR, EST AFRICAN AMERICAN: 84 mL/min — AB (ref >90)
GFR, EST NON AFRICAN AMERICAN: 73 mL/min — AB (ref >90)
GLUCOSE: 85 mg/dL (ref 70–99)
Potassium: 3.8 mEq/L (ref 3.7–5.3)
Sodium: 140 mEq/L (ref 137–147)

## 2014-01-29 LAB — TROPONIN I: Troponin I: <0.3 ng/mL (ref <0.30)

## 2014-01-29 MED ORDER — LISINOPRIL 20 MG PO TABS
20.0000 mg | ORAL_TABLET | Freq: Every day | ORAL | Status: DC
  Administered 2014-01-29 – 2014-02-07 (×10): 20 mg via ORAL
  Filled 2014-01-29 (×10): qty 1

## 2014-01-29 MED ORDER — DIGOXIN 125 MCG PO TABS
0.1250 mg | ORAL_TABLET | Freq: Every day | ORAL | Status: DC
  Administered 2014-01-30 – 2014-02-03 (×4): 0.125 mg via ORAL
  Filled 2014-01-29 (×7): qty 1

## 2014-01-29 NOTE — Progress Notes (Signed)
Patient Name: [name]      SUBJECTIVE  No vt  Sleeping and arouses witholut complaints  Past Medical History  Diagnosis Date  . Hypertension   . HIV infection   . Hepatitis     Hep B and Hep C (patient does not report this but these are listed in previous notes.)  . G6PD deficiency   . Chronic systolic heart failure     a. Echo 12/05/11:  EF 20-25%, diff HK with mild sparing of IL wall, mild AI, mod MR, mild LAE, mild RVE, mild reduced RVF.;  b.  Echo 05/11/2012:  Mild LVH, EF 20-25%, Gr 1 diast dysfn, mod MR, mild LAE  . NICM (nonischemic cardiomyopathy)     cardia CTA 8/13 negative for obstructive CAD  . CHF (congestive heart failure)   . GERD (gastroesophageal reflux disease)   . Depression     Scheduled Meds:  Scheduled Meds: . carvedilol  25 mg Oral BID WC  . colchicine  0.6 mg Oral BID  . digoxin  0.25 mg Oral Daily  . dolutegravir  50 mg Oral Q lunch  . emtricitabine-tenofovir  1 tablet Oral Q lunch  . enoxaparin (LOVENOX) injection  40 mg Subcutaneous Q24H  . rilpivirine  25 mg Oral Q lunch   Continuous Infusions: . sodium chloride Stopped (01/28/14 1100)  . amiodarone 30 mg/hr (01/29/14 0800)  . lidocaine 1 mg/min (01/29/14 0800)   acetaminophen, ALPRAZolam, LORazepam, nitroGLYCERIN, ondansetron (ZOFRAN) IV, zolpidem    PHYSICAL EXAM Filed Vitals:   01/29/14 0800 01/29/14 0900 01/29/14 0939 01/29/14 1000  BP: 162/117 155/112 149/105 130/83  Pulse: 67 61 68 63  Temp: 98.1 F (36.7 C)     TempSrc: Oral     Resp:      Height:      Weight:      SpO2: 100% 98%  96%   Well developed and nourished in no acute distress HENT normal Neck supple with JVP-flat Clear Regular rate and rhythm, no murmurs or gallops Abd-soft with active BS No Clubbing cyanosis edema Skin-warm and dry A & Oriented  Grossly normal sensory and motor function   TELEMETRY: Reviewed telemetry pt in no VT x 36 hr    Intake/Output Summary (Last 24 hours) at  01/29/14 1047 Last data filed at 01/29/14 1000  Gross per 24 hour  Intake 1420.8 ml  Output    575 ml  Net  845.8 ml    LABS: Basic Metabolic Panel:  Recent Labs Lab 01/27/14 1416 01/27/14 1435 01/27/14 1816 01/28/14 0629 01/29/14 0529  NA 139  --   --  139 140  K 3.8  --   --  3.8 3.8  CL 100  --   --  102 104  CO2 25  --   --  25 22  GLUCOSE 98  --   --  101* 85  BUN 19  --   --  14 14  CREATININE 1.12  --  0.92 1.07 1.15  CALCIUM 9.8  --  9.2 9.2 8.9  MG  --  2.1 2.0  --   --    Cardiac Enzymes:  Recent Labs  01/29/14 0529  TROPONINI <0.30   CBC:  Recent Labs Lab 01/27/14 1416 01/27/14 1816  WBC 8.5 9.8  HGB 16.4 16.1  HCT 47.2 46.3  MCV 85.7 85.4  PLT 246 249   PROTIME: No results found for this basename: LABPROT, INR,  in  the last 72 hours Liver Function Tests: No results found for this basename: AST, ALT, ALKPHOS, BILITOT, PROT, ALBUMIN,  in the last 72 hours No results found for this basename: LIPASE, AMYLASE,  in the last 72 hours BNP: BNP (last 3 results)  Recent Labs  02/06/13 1551 01/27/14 1816  PROBNP 1620.0* 953.7*   No intercurrent VT  ASSESSMENT AND PLAN:  Active Problems:   Ventricular tachycardia, sustained   Ventricular fibrillation   Implantable cardioverter-defibrillator (ICD) discharge   Sustained VT (ventricular tachycardia)  Continue AMIO IV overnight Discontinue Lidocaine Resume lisinopril at 20   Signed, Sherryl Manges MD  01/29/2014

## 2014-01-30 ENCOUNTER — Encounter: Payer: Self-pay | Admitting: Internal Medicine

## 2014-01-30 ENCOUNTER — Ambulatory Visit: Payer: Medicare Other | Admitting: Internal Medicine

## 2014-01-30 LAB — CLOSTRIDIUM DIFFICILE BY PCR: Toxigenic C. Difficile by PCR: NEGATIVE

## 2014-01-30 MED ORDER — AMIODARONE LOAD VIA INFUSION
150.0000 mg | Freq: Once | INTRAVENOUS | Status: DC
  Filled 2014-01-30: qty 83.34

## 2014-01-30 MED ORDER — AMIODARONE IV BOLUS ONLY 150 MG/100ML: 150.0000 mg | Freq: Once | INTRAVENOUS | Status: DC

## 2014-01-30 NOTE — Progress Notes (Signed)
Dr. Ladona Ridgel notified of pts anxiety and current status. Orders received

## 2014-01-30 NOTE — Care Management Note (Addendum)
    Page 1 of 2   02/05/2014     11:28:35 AM CARE MANAGEMENT NOTE 02/05/2014  Patient:  [name], [name]   Account Number:  [id number]  Date Initiated:  01/30/2014  Documentation initiated by:  Junius Creamer  Subjective/Objective Assessment:   adm w v [name]     Action/Plan:   lives w fam, pcp dr r comer   Anticipated DC Date:     Anticipated DC Plan:    In-house referral  Clinical Social Worker      DC Planning Services  CM consult      [facility] Choice  Resumption Of Svcs/PTA Provider   Choice offered to / List presented to:          [facility] arranged  HH-1 RN  HH-10 DISEASE MANAGEMENT      HH agency  [facility]   Status of service:   Medicare Important Message given?  YES (If response is "NO", the following Medicare IM given date fields will be blank) Date Medicare IM given:  01/30/2014 Medicare IM given by:  Junius Creamer Date Additional Medicare IM given:  02/05/2014 Additional Medicare IM given by:  [facility] Johnika Escareno  Discharge Disposition:    Per UR Regulation:  Reviewed for med. necessity/level of care/duration of stay  If discussed at Long Length of Stay Meetings, dates discussed:   02/01/2014    Comments:  9/1 1514 debbie dowell rn,bsn spke w pt. he lives at home. pt states has medicare and medicaid and would like to get aid 4hrs per day. explained that medicaid now has pcp office fill out paperwork then medicaid sends someone out to eval then if they feel pt needs assist will arrange. pt states he had shipmans in past. called pt's pcp dr comer w infect dis and left vm for them to ck on starting process to get him aid at home. pt anxious about icd shocks and just offered support. sw ref made also to help w serv that may be avial after disch. pt states has nse 1xmonth w [facility] by [employer]. have call into her office to talk w her and see if they can follow up about office working on pcs aid. will cont to follow and assist.

## 2014-01-30 NOTE — Progress Notes (Signed)
Patient is currently active with [facility] for chronic disease management services.  Patient has been engaged by a RN Community Care Coordinator.  Our community based plan of care has focused on disease management of CHF, medication procurement/adherence, and community resource support  Patient will receive a post discharge transition of care call and will be evaluated for monthly home visits for assessments and disease process education.  Made Inpatient Case Manager aware that [facility] following. Of note, [facility] services does not replace or interfere with any services that are arranged by inpatient case management or social work.  For additional questions or referrals please contact Tim Henderson BSN RN [facility] Liaison at [phone]. °

## 2014-01-30 NOTE — Progress Notes (Signed)
Pt had run of sustained VT, AICD fired and pt returned to V-paced rhythm.  Pt did not lose consciousness.  MD notified. Pt alert, anxious, ativan given.  Vital signs stable.  Emotional support provided

## 2014-01-30 NOTE — Progress Notes (Signed)
Chaplain visited pt at his request.  Chaplain encouraged pt, provided a listening ear and prayer.  Cindie Crumbly, [address]

## 2014-01-30 NOTE — Progress Notes (Signed)
ELECTROPHYSIOLOGY ROUNDING NOTE    Patient Name: [name] Date of Encounter: 01/30/2014    SUBJECTIVE:Patient was feeling well this morning until having another episode of VF, s/p successful ICD shock. No chest pain or shortness of breath.  Still with diarrhea - c diff pending.    Admitted 01-27-14 with VT storm.  Loaded with IV Amiodarone and Lidocaine.  Lidocaine discontinued 01-29-14.  IV Amiodarone still infusing.   TELEMETRY: Reviewed telemetry pt AV pacing, no further ventricular arrhythmias Filed Vitals:   01/30/14 0200 01/30/14 0400 01/30/14 0500 01/30/14 0600  BP: 112/97 134/100 113/84 129/79  Pulse:      Temp:  98.3 F (36.8 C)    TempSrc:  Oral    Resp:      Height:      Weight:      SpO2:  98%      Intake/Output Summary (Last 24 hours) at 01/30/14 0634 Last data filed at 01/30/14 0600  Gross per 24 hour  Intake 924.38 ml  Output      0 ml  Net 924.38 ml    CURRENT MEDICATIONS: . carvedilol  25 mg Oral BID WC  . colchicine  0.6 mg Oral BID  . digoxin  0.125 mg Oral Daily  . dolutegravir  50 mg Oral Q lunch  . emtricitabine-tenofovir  1 tablet Oral Q lunch  . enoxaparin (LOVENOX) injection  40 mg Subcutaneous Q24H  . lisinopril  20 mg Oral Daily  . rilpivirine  25 mg Oral Q lunch   . sodium chloride Stopped (01/28/14 1100)  . amiodarone 30 mg/hr (01/30/14 0550)     LABS: Basic Metabolic Panel:  Recent Labs  95/62/13 1435 01/27/14 1816 01/28/14 0629 01/29/14 0529  NA  --   --  139 140  K  --   --  3.8 3.8  CL  --   --  102 104  CO2  --   --  25 22  GLUCOSE  --   --  101* 85  BUN  --   --  14 14  CREATININE  --  0.92 1.07 1.15  CALCIUM  --  9.2 9.2 8.9  MG 2.1 2.0  --   --    CBC:  Recent Labs  01/27/14 1416 01/27/14 1816  WBC 8.5 9.8  HGB 16.4 16.1  HCT 47.2 46.3  MCV 85.7 85.4  PLT 246 249   Cardiac Enzymes:  Recent Labs  01/29/14 0529  TROPONINI <0.30     Radiology/Studies:  Dg Chest Portable 1 View 01/27/2014    CLINICAL DATA:  Irregular heartbeat.  Loss of consciousness.  EXAM: PORTABLE CHEST - 1 VIEW  COMPARISON:  06/27/2013.  FINDINGS: Mediastinum and hilar structures normal. Cardiomegaly. Cardiac pacer with lead tips in right atrium and right ventricle. No CHF. No focal infiltrate. No pleural effusion or pneumothorax. No acute bony abnormality.  IMPRESSION: 1. Stable cardiomegaly.  Cardiac pacer.  No CHF. 2. No acute pulmonary disease.   Electronically Signed   By: Maisie Fus  Register   On: 01/27/2014 14:46    PHYSICAL EXAM anxious appearing middle aged man, NAD HEENT: Unremarkable,[place], AT Neck:  6 JVD, no thyromegally Back:  No CVA tenderness Lungs:  Clear with no wheezes, rales, or rhonchi HEART:  Regular rate rhythm, no murmurs, no rubs, no clicks Abd:  soft, positive bowel sounds, no organomegally, no rebound, no guarding Ext:  2 plus pulses, no edema, no cyanosis, no clubbing Skin:  No rashes no nodules  Neuro:  CN II through XII intact, motor grossly intact    Active Problems:   Ventricular tachycardia, sustained   Ventricular fibrillation   Implantable cardioverter-defibrillator (ICD) discharge   Sustained VT (ventricular tachycardia)   The patient has had another episode of VF now 48 hours after initiation of IV Amio. If he has more, will restart IV lidocaine while amio is loading.  [name]

## 2014-01-31 ENCOUNTER — Encounter: Payer: Medicare Other | Admitting: *Deleted

## 2014-01-31 ENCOUNTER — Encounter (HOSPITAL_COMMUNITY)
Admission: EM | Disposition: A | Discharge status: Home Health | Payer: Self-pay | Source: Home / Self Care | Attending: Internal Medicine

## 2014-01-31 DIAGNOSIS — I428 Other cardiomyopathies: Secondary | ICD-10-CM

## 2014-01-31 HISTORY — PX: LEFT HEART CATHETERIZATION WITH CORONARY ANGIOGRAM: SHX5451

## 2014-01-31 SURGERY — LEFT HEART CATHETERIZATION WITH CORONARY ANGIOGRAM
Anesthesia: LOCAL

## 2014-01-31 MED ORDER — HEPARIN SODIUM (PORCINE) 1000 UNIT/ML IJ SOLN
INTRAMUSCULAR | Status: AC
  Filled 2014-01-31: qty 1

## 2014-01-31 MED ORDER — HEPARIN (PORCINE) IN NACL 2-0.9 UNIT/ML-% IJ SOLN
INTRAMUSCULAR | Status: AC
  Filled 2014-01-31: qty 1000

## 2014-01-31 MED ORDER — MIDAZOLAM HCL 2 MG/2ML IJ SOLN
INTRAMUSCULAR | Status: AC
Start: 2014-01-31 — End: 2014-01-31
  Filled 2014-01-31: qty 2

## 2014-01-31 MED ORDER — SODIUM CHLORIDE 0.9 % IJ SOLN: 3.0000 mL | Freq: Two times a day (BID) | INTRAMUSCULAR | Status: DC

## 2014-01-31 MED ORDER — VERAPAMIL HCL 2.5 MG/ML IV SOLN
INTRAVENOUS | Status: AC
  Filled 2014-01-31: qty 2

## 2014-01-31 MED ORDER — SODIUM CHLORIDE 0.9 % IJ SOLN: 3.0000 mL | INTRAMUSCULAR | Status: DC | PRN

## 2014-01-31 MED ORDER — SODIUM CHLORIDE 0.9 % IV SOLN: 250.0000 mL | INTRAVENOUS | Status: DC | PRN

## 2014-01-31 MED ORDER — ASPIRIN 81 MG PO CHEW
81.0000 mg | CHEWABLE_TABLET | ORAL | Status: AC
  Administered 2014-01-31: 81 mg via ORAL
  Filled 2014-01-31: qty 1

## 2014-01-31 MED ORDER — FENTANYL CITRATE 0.05 MG/ML IJ SOLN
INTRAMUSCULAR | Status: AC
  Filled 2014-01-31: qty 2

## 2014-01-31 MED ORDER — LIDOCAINE HCL (PF) 1 % IJ SOLN
INTRAMUSCULAR | Status: AC
Start: 2014-01-31 — End: 2014-01-31
  Filled 2014-01-31: qty 30

## 2014-01-31 NOTE — Progress Notes (Addendum)
Patient ID: [name], male   DOB: 04/25/1966, 48 y.o.   MRN: [id number]   ELECTROPHYSIOLOGY ROUNDING NOTE    Patient Name: [name] Date of Encounter: 01/31/2014    SUBJECTIVE:Patient relaxed this morning but had another episode of VF, s/p successful ICD shock at 2100 last night. No chest pain or shortness of breath.  He has been sedated. Admitted 01-27-14 with VT storm.  Loaded with IV Amiodarone and Lidocaine.  Lidocaine discontinued 01-29-14.  IV Amiodarone still infusing.   TELEMETRY: Reviewed telemetry pt AV pacing, no further ventricular arrhythmias Filed Vitals:   01/31/14 0300 01/31/14 0400 01/31/14 0500 01/31/14 0600  BP: 101/83 107/73 122/83 131/99  Pulse:      Temp:  98.5 F (36.9 C)    TempSrc:  Oral    Resp:      Height:      Weight:      SpO2:  98%      Intake/Output Summary (Last 24 hours) at 01/31/14 0727 Last data filed at 01/31/14 0600  Gross per 24 hour  Intake 1360.8 ml  Output    450 ml  Net  910.8 ml    CURRENT MEDICATIONS: . amiodarone  150 mg Intravenous Once  . carvedilol  25 mg Oral BID WC  . colchicine  0.6 mg Oral BID  . digoxin  0.125 mg Oral Daily  . dolutegravir  50 mg Oral Q lunch  . emtricitabine-tenofovir  1 tablet Oral Q lunch  . enoxaparin (LOVENOX) injection  40 mg Subcutaneous Q24H  . lisinopril  20 mg Oral Daily  . rilpivirine  25 mg Oral Q lunch   . sodium chloride Stopped (01/28/14 1100)  . amiodarone 30 mg/hr (01/30/14 2000)     LABS: Basic Metabolic Panel:  Recent Labs  63/87/56 0529  NA 140  K 3.8  CL 104  CO2 22  GLUCOSE 85  BUN 14  CREATININE 1.15  CALCIUM 8.9   CBC: No results found for this basename: WBC, NEUTROABS, HGB, HCT, MCV, PLT,  in the last 72 hours Cardiac Enzymes:  Recent Labs  01/29/14 0529  TROPONINI <0.30     Radiology/Studies:  Dg Chest Portable 1 View 01/27/2014   CLINICAL DATA:  Irregular heartbeat.  Loss of consciousness.  EXAM: PORTABLE CHEST - 1 VIEW  COMPARISON:   06/27/2013.  FINDINGS: Mediastinum and hilar structures normal. Cardiomegaly. Cardiac pacer with lead tips in right atrium and right ventricle. No CHF. No focal infiltrate. No pleural effusion or pneumothorax. No acute bony abnormality.  IMPRESSION: 1. Stable cardiomegaly.  Cardiac pacer.  No CHF. 2. No acute pulmonary disease.   Electronically Signed   By: Maisie Fus  Register   On: 01/27/2014 14:46    PHYSICAL EXAM anxious appearing middle aged man, NAD HEENT: Unremarkable,[place], AT Neck:  6 JVD, no thyromegally Back:  No CVA tenderness Lungs:  Clear with no wheezes, rales, or rhonchi HEART:  Regular rate rhythm, no murmurs, no rubs, no clicks Abd:  soft, positive bowel sounds, no organomegally, no rebound, no guarding Ext:  2 plus pulses, no edema, no cyanosis, no clubbing Skin:  No rashes no nodules Neuro:  CN II through XII intact, motor grossly intact    Active Problems:   Ventricular tachycardia, sustained   Ventricular fibrillation   Implantable cardioverter-defibrillator (ICD) discharge   Sustained VT (ventricular tachycardia)   The patient has had another episode of VF now 60 hours after initiation of IV Amio. I have reviewed his old records  which is very difficult at best in EPIC. I can find no prior heart catheterization. He had a cardiac CT scan done 2 years ago which demonstrated some calcium but not obstructive CAD. While progression of CAD seems unlikely, the patient continues to have polymorphic VT. I will plan to have him undergo left heart catheterization as his ventricular arrhythmias are polymorphic VT/VF and we need to rule out an ischemic trigger.   [name]

## 2014-01-31 NOTE — CV Procedure (Signed)
   Cardiac Catheterization Procedure Note  Name: [name] MRN: [id number] DOB: 12-16-1965  Procedure: Left Heart Cath, Selective Coronary Angiography, LV angiography  Indication:   Cardiomyopathy with sustained ventricular tachycardia  Medications:  Sedation:  2 mg IV Versed, 25 mcg IV Fentanyl  Contrast:  60 mL Omnipaque   Procedural Details: The right wrist was prepped, draped, and anesthetized with 1% lidocaine. Using the modified Seldinger technique, a 5 French sheath was introduced into the right radial artery. 3 mg of verapamil was administered through the sheath, weight-based unfractionated heparin was administered intravenously. A Jackie catheter was used for selective coronary angiography. A pigtail catheter was used for left ventriculography. Catheter exchanges were performed over an exchange length guidewire. There were no immediate procedural complications. A TR band was used for radial hemostasis at the completion of the procedure.  The patient was transferred to the post catheterization recovery area for further monitoring.  Procedural Findings:  Hemodynamics: AO:  144/92   mmHg LV:  138/8    mmHg LVEDP: 16  mmHg  Coronary angiography: Coronary dominance: Right   Left Main:  Normal.   Left Anterior Descending (LAD):  Normal.   1st diagonal (D1):  Normal  2nd diagonal (D2):  Normal   3rd diagonal (D3):  Small in size and normal.   Circumflex (LCx):  Normal in size and nondominant. No significant disease.   1st obtuse marginal:  Small in size with no significant disease.   2nd obtuse marginal:  Normal in size with no disease.   3rd obtuse marginal:   Normal in size with no significant disease.   AV groove continuation segment: Small in size with no significant disease.  Right Coronary Artery: Very large in size and dominant. The vessel is free of significant disease.  Posterior descending artery: very large with no significant disease.  Posterior  AV segment:  Normal  Posterolateral branchs:  Normal  Left ventriculography: Left ventricular systolic function is  severely reduced , LVEF is estimated at  10-15 %, there is  Mild-moderate mitral regurgitation   Final Conclusions:   1. Normal coronary arteries. 2. Severely reduced LV systolic function with an ejection fraction of 10-15% with mild-moderate mitral regurgitation. 3. Mildly elevated left ventricular end-diastolic pressure.  Recommendations:  Continue management of ventricular tachycardia.  Lorine Bears MD, [facility] 01/31/2014, 2:23 PM

## 2014-01-31 NOTE — H&P (View-Only) (Signed)
Patient ID: [name], male   DOB: 04/25/1966, 48 y.o.   MRN: [id number]   ELECTROPHYSIOLOGY ROUNDING NOTE    Patient Name: [name] Date of Encounter: 01/31/2014    SUBJECTIVE:Patient relaxed this morning but had another episode of VF, s/p successful ICD shock at 2100 last night. No chest pain or shortness of breath.  He has been sedated. Admitted 01-27-14 with VT storm.  Loaded with IV Amiodarone and Lidocaine.  Lidocaine discontinued 01-29-14.  IV Amiodarone still infusing.   TELEMETRY: Reviewed telemetry pt AV pacing, no further ventricular arrhythmias Filed Vitals:   01/31/14 0300 01/31/14 0400 01/31/14 0500 01/31/14 0600  BP: 101/83 107/73 122/83 131/99  Pulse:      Temp:  98.5 F (36.9 C)    TempSrc:  Oral    Resp:      Height:      Weight:      SpO2:  98%      Intake/Output Summary (Last 24 hours) at 01/31/14 0727 Last data filed at 01/31/14 0600  Gross per 24 hour  Intake 1360.8 ml  Output    450 ml  Net  910.8 ml    CURRENT MEDICATIONS: . amiodarone  150 mg Intravenous Once  . carvedilol  25 mg Oral BID WC  . colchicine  0.6 mg Oral BID  . digoxin  0.125 mg Oral Daily  . dolutegravir  50 mg Oral Q lunch  . emtricitabine-tenofovir  1 tablet Oral Q lunch  . enoxaparin (LOVENOX) injection  40 mg Subcutaneous Q24H  . lisinopril  20 mg Oral Daily  . rilpivirine  25 mg Oral Q lunch   . sodium chloride Stopped (01/28/14 1100)  . amiodarone 30 mg/hr (01/30/14 2000)     LABS: Basic Metabolic Panel:  Recent Labs  63/87/56 0529  NA 140  K 3.8  CL 104  CO2 22  GLUCOSE 85  BUN 14  CREATININE 1.15  CALCIUM 8.9   CBC: No results found for this basename: WBC, NEUTROABS, HGB, HCT, MCV, PLT,  in the last 72 hours Cardiac Enzymes:  Recent Labs  01/29/14 0529  TROPONINI <0.30     Radiology/Studies:  Dg Chest Portable 1 View 01/27/2014   CLINICAL DATA:  Irregular heartbeat.  Loss of consciousness.  EXAM: PORTABLE CHEST - 1 VIEW  COMPARISON:   06/27/2013.  FINDINGS: Mediastinum and hilar structures normal. Cardiomegaly. Cardiac pacer with lead tips in right atrium and right ventricle. No CHF. No focal infiltrate. No pleural effusion or pneumothorax. No acute bony abnormality.  IMPRESSION: 1. Stable cardiomegaly.  Cardiac pacer.  No CHF. 2. No acute pulmonary disease.   Electronically Signed   By: Maisie Fus  Register   On: 01/27/2014 14:46    PHYSICAL EXAM anxious appearing middle aged man, NAD HEENT: Unremarkable,[place], AT Neck:  6 JVD, no thyromegally Back:  No CVA tenderness Lungs:  Clear with no wheezes, rales, or rhonchi HEART:  Regular rate rhythm, no murmurs, no rubs, no clicks Abd:  soft, positive bowel sounds, no organomegally, no rebound, no guarding Ext:  2 plus pulses, no edema, no cyanosis, no clubbing Skin:  No rashes no nodules Neuro:  CN II through XII intact, motor grossly intact    Active Problems:   Ventricular tachycardia, sustained   Ventricular fibrillation   Implantable cardioverter-defibrillator (ICD) discharge   Sustained VT (ventricular tachycardia)   The patient has had another episode of VF now 60 hours after initiation of IV Amio. I have reviewed his old records  which is very difficult at best in EPIC. I can find no prior heart catheterization. He had a cardiac CT scan done 2 years ago which demonstrated some calcium but not obstructive CAD. While progression of CAD seems unlikely, the patient continues to have polymorphic VT. I will plan to have him undergo left heart catheterization as his ventricular arrhythmias are polymorphic VT/VF and we need to rule out an ischemic trigger.   Gregg Taylor,M.D.   

## 2014-01-31 NOTE — Interval H&P Note (Signed)
History and Physical Interval Note:  01/31/2014 1:45 PM  [name]  has presented today for surgery, with the diagnosis of cp  The various methods of treatment have been discussed with the patient and family. After consideration of risks, benefits and other options for treatment, the patient has consented to  Procedure(s): LEFT HEART CATHETERIZATION WITH CORONARY ANGIOGRAM (N/A) as a surgical intervention .  The patient's history has been reviewed, patient examined, no change in status, stable for surgery.  I have reviewed the patient's chart and labs.  Questions were answered to the patient's satisfaction.     Lorine Bears

## 2014-01-31 NOTE — Progress Notes (Signed)
Met with patient and sister at bedside to introduce hospital liaison support services.  Patient is very focused on receiving PCS services from [facility] at discharge.  Inpatient RNCM has made patient aware of the process to resume his services.  Will continue to monitor.  Of note, [facility] Care Management services does not replace or interfere with any services that are arranged by inpatient case management or social work.  For additional questions or referrals please contact Corliss Blacker BSN RN [facility] Liaison at [phone].

## 2014-02-01 ENCOUNTER — Telehealth: Payer: Self-pay | Admitting: *Deleted

## 2014-02-01 ENCOUNTER — Encounter: Payer: Self-pay | Admitting: Internal Medicine

## 2014-02-01 LAB — COMPREHENSIVE METABOLIC PANEL
ALBUMIN: 3.5 g/dL (ref 3.5–5.2)
ALK PHOS: 69 U/L (ref 39–117)
ALT: 114 U/L — ABNORMAL HIGH (ref 0–53)
AST: 77 U/L — AB (ref 0–37)
Anion gap: 16 — ABNORMAL HIGH (ref 5–15)
BILIRUBIN TOTAL: 0.5 mg/dL (ref 0.3–1.2)
BUN: 9 mg/dL (ref 6–23)
CHLORIDE: 106 meq/L (ref 96–112)
CO2: 20 mEq/L (ref 19–32)
Calcium: 8.9 mg/dL (ref 8.4–10.5)
Creatinine, Ser: 1.03 mg/dL (ref 0.50–1.35)
GFR calc Af Amer: >90 mL/min (ref >90)
GFR, EST NON AFRICAN AMERICAN: 83 mL/min — AB (ref >90)
Glucose, Bld: 93 mg/dL (ref 70–99)
POTASSIUM: 3.6 meq/L — AB (ref 3.7–5.3)
Sodium: 142 mEq/L (ref 137–147)
Total Protein: 7.2 g/dL (ref 6.0–8.3)

## 2014-02-01 LAB — TSH: TSH: 2.17 u[IU]/mL (ref 0.350–4.500)

## 2014-02-01 MED ORDER — SPIRONOLACTONE 12.5 MG HALF TABLET
12.5000 mg | ORAL_TABLET | Freq: Every day | ORAL | Status: DC
  Administered 2014-02-01 – 2014-02-07 (×7): 12.5 mg via ORAL
  Filled 2014-02-01 (×7): qty 1

## 2014-02-01 NOTE — Telephone Encounter (Signed)
Form faxed to [facility] at [phone] for personal care services for patient. Signed by Dr. Luciana Axe; original placed upfront for scan. Wendall Mola

## 2014-02-01 NOTE — Clinical Social Work Psychosocial (Addendum)
Clinical Social Work Department BRIEF PSYCHOSOCIAL ASSESSMENT 02/01/2014  Patient:  [name], [name]     Account Number:  [id number]     Admit date:  01/27/2014  Clinical Social Worker:  Marciano Sequin  Date/Time:  02/01/2014 12:01 PM  Referred by:  RN  Date Referred:  02/01/2014 Referred for  Psychosocial assessment   Other Referral:   Interview type:  Patient Other interview type:    PSYCHOSOCIAL DATA Living Status:  ALONE Admitted from facility:  N/A Level of care:   Primary support name:  Shanon Brow Primary support relationship to patient:  SIGNIFICANT OTHER Degree of support available:   Good    CURRENT CONCERNS Current Concerns  Other - See comment   Other Concerns:   pt is stressed and anxious about having a new pace maker.    SOCIAL WORK ASSESSMENT / PLAN CSW received consult to meet with pt. CSW met pt at bedside. CSW introduce self and purpose of the visit. CSW use strength base approach to engage the pt. The pt presented with a sad affect and distress mood. "I'm upset and anxious because I don't know why this is happening to me. Since I've been here I have had 13 to 15 shocks. I got my pace maker in January. I got one shock in April and another shock in June then July. My last shock was on Saturday." CSW and pt processed difference events which has occurred in his life since leading up to Saturday. "I really don't know. I was outside listening to music and I woke up on the ground. I called out to my significant other and I had another shock." Pt identified being unhappy since receiving the pace maker due to living in the "unknown." Pt reported seeking answers to why he has had multiple shocks. "No one seems to know the answer. I feel that the doctor who put it in should know because it is his specialty." CSW and pt reviewed the purpose and function of the pace maker. CSW and pt discussed some reasons to way his is receiving more shocks. Pt identified that he lives on edge  because he anticipate the shocks. CSW and pt processed the pt's anticipation with hearing his monitor beep, which make the pt anxiety. CSW and pt processed 2 stress management techniques and positive self-talk techniques. CSW and pt role played how to use square breathing, counting backward and breathing, and how to use positive self-talk. CSW explained to the pt the notion of not allow the shocks of his pace maker to control his life instead he can learn to control it, by decreasing worrying and returning back to his normal routine.   Assessment/plan status:  Psychosocial Support/Ongoing Assessment of Needs Other assessment/ plan:   Information/referral to community resources:   a Building surveyor of spirtial counselors and therapists    PATIENT'S/FAMILY'S RESPONSE TO PLAN OF CARE: It appears that the pt was receptive to the tools discussed. It appears that the pt is scare due to having a new pace maker and experiencing back to back shock without explanation. Pervious to receiving back to back shock the pt reported being "a high-spirited lively person who love to have fun."    Hannia Matchett, MSW, [place]

## 2014-02-01 NOTE — Progress Notes (Signed)
Advanced Heart Failure Rounding Note   Subjective:    [name] is a 48 y.o. 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 He also has a h/o syncope and previously had an EP study which was non-inducible for VT.   Seen in HF clinic on 01/19/14 with NYHA II symptoms and ICD interrogated. He was found to have Vfib x5 times and was sent to Dr. Ladona Ridgel. He was seen as OP and sotalol was started and he had recurrent VT and was admitted. Patient has been taking medications as prescribed and not missed any doses prior to admission. He was initally started on lidocaine and that was stopped and remains on IV amiodarone. Underwent LHC and had normal coronaries. Very scared about being shocked again. Denies SOB, orthopnea or CP.   Objective:   Weight Range:  Vital Signs:   Temp:  [97.8 F (36.6 C)-98.4 F (36.9 C)] 98 F (36.7 C) (09/03 0400) Pulse Rate:  [55-63] 62 (09/03 1016) Resp:  [16] 16 (09/02 2000) BP: (104-157)/(65-123) 123/77 mmHg (09/03 1200) SpO2:  [94 %-98 %] 98 % (09/03 1200) Last BM Date: 02/01/14  Weight change: Filed Weights   01/27/14 2200  Weight: 154 lb 15.7 oz (70.3 kg)    Intake/Output:   Intake/Output Summary (Last 24 hours) at 02/01/14 1314 Last data filed at 02/01/14 1200  Gross per 24 hour  Intake  640.5 ml  Output    450 ml  Net  190.5 ml     Physical Exam: General:  Well appearing. No resp difficulty HEENT: normal Neck: supple. JVP . Carotids 2+ bilat; no bruits. No lymphadenopathy or thryomegaly appreciated. Cor: PMI nondisplaced. Regular rate & rhythm. No rubs, gallops or murmurs. Lungs: clear Abdomen: soft, nontender, nondistended. No hepatosplenomegaly. No bruits or masses. Good bowel sounds. Extremities: no cyanosis, clubbing, rash, edema Neuro: alert & orientedx3, cranial nerves grossly intact. moves all 4 extremities w/o difficulty. Affect pleasant  Telemetry: Vpaced  Labs: Basic  Metabolic Panel:  Recent Labs Lab 01/27/14 1416 01/27/14 1435 01/27/14 1816 01/28/14 0629 01/29/14 0529 02/01/14 0305  NA 139  --   --  139 140 142  K 3.8  --   --  3.8 3.8 3.6*  CL 100  --   --  102 104 106  CO2 25  --   --  25 22 20   GLUCOSE 98  --   --  101* 85 93  BUN 19  --   --  14 14 9   CREATININE 1.12  --  0.92 1.07 1.15 1.03  CALCIUM 9.8  --  9.2 9.2 8.9 8.9  MG  --  2.1 2.0  --   --   --     Liver Function Tests:  Recent Labs Lab 02/01/14 0305  AST 77*  ALT 114*  ALKPHOS 69  BILITOT 0.5  PROT 7.2  ALBUMIN 3.5   No results found for this basename: LIPASE, AMYLASE,  in the last 168 hours No results found for this basename: AMMONIA,  in the last 168 hours  CBC:  Recent Labs Lab 01/27/14 1416 01/27/14 1816  WBC 8.5 9.8  HGB 16.4 16.1  HCT 47.2 46.3  MCV 85.7 85.4  PLT 246 249    Cardiac Enzymes:  Recent Labs Lab 01/29/14 0529  TROPONINI <0.30    BNP: BNP (last 3 results)  Recent Labs  02/06/13 1551 01/27/14 1816  PROBNP 1620.0* 953.7*  Other results:  EKG:   Imaging:  No results found.   Medications:     Scheduled Medications: . amiodarone  150 mg Intravenous Once  . carvedilol  25 mg Oral BID WC  . colchicine  0.6 mg Oral BID  . digoxin  0.125 mg Oral Daily  . dolutegravir  50 mg Oral Q lunch  . emtricitabine-tenofovir  1 tablet Oral Q lunch  . enoxaparin (LOVENOX) injection  40 mg Subcutaneous Q24H  . lisinopril  20 mg Oral Daily  . rilpivirine  25 mg Oral Q lunch     Infusions: . sodium chloride 5 mL/hr at 01/31/14 2000  . amiodarone 30 mg/hr (02/01/14 0830)     PRN Medications:  acetaminophen, ALPRAZolam, LORazepam, nitroGLYCERIN, ondansetron (ZOFRAN) IV, zolpidem   Assessment:   1) VTstorm/Vfib - shocked 13 times 2) Chronic systolic HF - EF 40-98% 3) HIV 4) NICM 5) LBBB 6) CRT-D  Plan/Discussion:    Very well known to the HF team and is a very pleasant gentleman. Admitted for VT storm  and was on lidocaine and had repeat VT and then was placed on amiodarone which continues. LHC yesterday showed normal coronaries and EF 15%.   Volume status looks stable. Will start spiro 12.5 mg daily and follow renal function. Check dig level.  OOB and ambulate in the halls.   Length of Stay: 5  Aundria Rud 02/01/2014, 1:14 PM  Advanced Heart Failure Team Pager [phone] (M-F; 7a - 4p)  Please contact [facility] for night-coverage after hours (4p -7a ) and weekends on [email or web address]  Patient seen with NP, agree with the above note.  He was admitted with VT storm and sotalol failure.  He has not had VT over the last 24 hours with ongoing amiodarone loading and lidocaine is off.  He had LHC with normal coronaries.  Trigger for VT is uncertain.  EF down to 15% from 25%, but this is after multiple shocks and suspect a component of myocardial stunning. He is not volume overloaded on exam.  He feels ok today.  - Add spironolactone 12.5 mg daily => long-term will lower risk of ventricular arrhythmias - Ongoing loading of amiodarone per EP, if continues to have VT would have to consider ablation.   Continue current lisinopril and Coreg.   We will follow.  Marca Ancona 02/01/2014 1:32 PM

## 2014-02-01 NOTE — Progress Notes (Signed)
Patient ID: ROD VENTURINO, male   DOB: [birth year], 48 y.o.   MRN: [id number]   ELECTROPHYSIOLOGY ROUNDING NOTE    Patient Name: [name] Date of Encounter: 02/01/2014    SUBJECTIVE:Patient relaxed this morning but still appropriately anxious. No ICD shocks. Results of heart cath noted. No chest pain.  He has been sedated. Admitted 01-27-14 with VT/VF storm.  Loaded with IV Amiodarone and Lidocaine.  Lidocaine discontinued 01-29-14.  IV Amiodarone still infusing.   TELEMETRY: Reviewed telemetry pt AV pacing, no further ventricular arrhythmias Filed Vitals:   02/01/14 0307 02/01/14 0400 02/01/14 0500 02/01/14 0600  BP: 126/82 114/81 116/81 111/78  Pulse: 63 57 56 57  Temp:  98 F (36.7 C)    TempSrc:  Oral    Resp:      Height:      Weight:      SpO2: 96% 96% 96% 95%    Intake/Output Summary (Last 24 hours) at 02/01/14 0804 Last data filed at 02/01/14 0600  Gross per 24 hour  Intake  650.8 ml  Output    450 ml  Net  200.8 ml    CURRENT MEDICATIONS: . amiodarone  150 mg Intravenous Once  . carvedilol  25 mg Oral BID WC  . colchicine  0.6 mg Oral BID  . digoxin  0.125 mg Oral Daily  . dolutegravir  50 mg Oral Q lunch  . emtricitabine-tenofovir  1 tablet Oral Q lunch  . enoxaparin (LOVENOX) injection  40 mg Subcutaneous Q24H  . lisinopril  20 mg Oral Daily  . rilpivirine  25 mg Oral Q lunch   . sodium chloride 5 mL/hr at 01/31/14 2000  . amiodarone 30 mg/hr (01/31/14 2210)     LABS: Basic Metabolic Panel:  Recent Labs  58/09/98 0305  NA 142  K 3.6*  CL 106  CO2 20  GLUCOSE 93  BUN 9  CREATININE 1.03  CALCIUM 8.9   CBC: No results found for this basename: WBC, NEUTROABS, HGB, HCT, MCV, PLT,  in the last 72 hours Cardiac Enzymes: No results found for this basename: CKTOTAL, CKMB, CKMBINDEX, TROPONINI,  in the last 72 hours   Radiology/Studies:  Dg Chest Portable 1 View 01/27/2014   CLINICAL DATA:  Irregular heartbeat.  Loss of consciousness.   EXAM: PORTABLE CHEST - 1 VIEW  COMPARISON:  06/27/2013.  FINDINGS: Mediastinum and hilar structures normal. Cardiomegaly. Cardiac pacer with lead tips in right atrium and right ventricle. No CHF. No focal infiltrate. No pleural effusion or pneumothorax. No acute bony abnormality.  IMPRESSION: 1. Stable cardiomegaly.  Cardiac pacer.  No CHF. 2. No acute pulmonary disease.   Electronically Signed   By: Maisie Fus  Register   On: 01/27/2014 14:46    PHYSICAL EXAM somnolent appearing middle aged man, NAD HEENT: Unremarkable,[place], AT Neck:  7 JVD, no thyromegally Back:  No CVA tenderness Lungs:  Clear with no wheezes, rales, or rhonchi HEART:  Regular rate rhythm, no murmurs, no rubs, no clicks Abd:  soft, positive bowel sounds, no organomegally, no rebound, no guarding Ext:  2 plus pulses, no edema, no cyanosis, no clubbing. Right wrist without hematoma Skin:  No rashes no nodules Neuro:  CN II through XII intact, motor grossly intact    Active Problems:   Ventricular tachycardia, sustained   Ventricular fibrillation   Implantable cardioverter-defibrillator (ICD) discharge   Sustained VT (ventricular tachycardia)   Cardiac cath yesterday does not show CAD but does demonstrate worsening LV function. Over the past  2 years, his EF has dropped from 35-40% down to 10-15%. Will continue IV amio, switching to po tomorrow if stable and ask our CHF team to evaluate. Clinically he has not had much CHF over the past 6 months but I suspect he will worsen as his EF worsens.  [name]

## 2014-02-02 LAB — BASIC METABOLIC PANEL
ANION GAP: 15 (ref 5–15)
BUN: 9 mg/dL (ref 6–23)
CALCIUM: 9.4 mg/dL (ref 8.4–10.5)
CHLORIDE: 104 meq/L (ref 96–112)
CO2: 22 meq/L (ref 19–32)
Creatinine, Ser: 1.16 mg/dL (ref 0.50–1.35)
GFR calc non Af Amer: 72 mL/min — ABNORMAL LOW (ref >90)
GFR, EST AFRICAN AMERICAN: 83 mL/min — AB (ref >90)
Glucose, Bld: 92 mg/dL (ref 70–99)
Potassium: 3.7 mEq/L (ref 3.7–5.3)
SODIUM: 141 meq/L (ref 137–147)

## 2014-02-02 LAB — DIGOXIN LEVEL: Digoxin Level: 0.4 ng/mL — ABNORMAL LOW (ref 0.8–2.0)

## 2014-02-02 MED ORDER — AMIODARONE HCL IN DEXTROSE 360-4.14 MG/200ML-% IV SOLN
INTRAVENOUS | Status: AC
  Administered 2014-02-02: 30 mg/h via INTRAVENOUS
  Filled 2014-02-02: qty 200

## 2014-02-02 MED ORDER — LOPERAMIDE HCL 2 MG PO CAPS
4.0000 mg | ORAL_CAPSULE | Freq: Once | ORAL | Status: AC
  Administered 2014-02-02: 4 mg via ORAL
  Filled 2014-02-02: qty 2

## 2014-02-02 MED ORDER — ISOSORB DINITRATE-HYDRALAZINE 20-37.5 MG PO TABS
0.5000 | ORAL_TABLET | Freq: Three times a day (TID) | ORAL | Status: DC
  Administered 2014-02-02 (×3): 0.5 via ORAL
  Filled 2014-02-02 (×8): qty 0.5

## 2014-02-02 MED ORDER — AMIODARONE HCL 200 MG PO TABS
400.0000 mg | ORAL_TABLET | Freq: Two times a day (BID) | ORAL | Status: DC
  Administered 2014-02-02 – 2014-02-07 (×10): 400 mg via ORAL
  Filled 2014-02-02 (×12): qty 2

## 2014-02-02 MED ORDER — LOPERAMIDE HCL 2 MG PO CAPS: 2.0000 mg | ORAL_CAPSULE | ORAL | Status: DC | PRN

## 2014-02-02 NOTE — Progress Notes (Signed)
Advanced Heart Failure Rounding Note   Subjective:    [name] is a 48 y.o. 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 He also has a h/o syncope and previously had an EP study which was non-inducible for VT.   Seen in HF clinic on 01/19/14 with NYHA II symptoms and ICD interrogated. He was found to have Vfib x5 times and was sent to Dr. Ladona Ridgel. He was seen as OP and sotalol was started and he had recurrent VT and was admitted. Patient has been taking medications as prescribed and not missed any doses prior to admission. He was initally started on lidocaine and that was stopped and remains on IV amiodarone. Underwent LHC and had normal coronaries. Denies SOB, orthopnea or CP. Started on Spiro 12.5 mg yesterday.   Objective:   Weight Range:  Vital Signs:   Temp:  [97.8 F (36.6 C)-98.5 F (36.9 C)] 97.8 F (36.6 C) (09/04 0300) Pulse Rate:  [53-62] 53 (09/04 0300) Resp:  [16-20] 17 (09/04 0300) BP: (101-152)/(70-121) 121/80 mmHg (09/04 0600) SpO2:  [97 %-98 %] 98 % (09/04 0300) Weight:  [153 lb 7 oz (69.6 kg)] 153 lb 7 oz (69.6 kg) (09/04 0300) Last BM Date: 02/01/14  Weight change: Filed Weights   01/27/14 2200 02/02/14 0300  Weight: 154 lb 15.7 oz (70.3 kg) 153 lb 7 oz (69.6 kg)    Intake/Output:   Intake/Output Summary (Last 24 hours) at 02/02/14 0705 Last data filed at 02/02/14 0600  Gross per 24 hour  Intake  760.8 ml  Output      0 ml  Net  760.8 ml     Physical Exam: General:  Well appearing. No resp difficulty HEENT: normal Neck: supple. JVP flat . Carotids 2+ bilat; no bruits. No lymphadenopathy or thryomegaly appreciated. Cor: PMI nondisplaced. Regular rate & rhythm. No rubs, gallops or murmurs. Lungs: clear Abdomen: soft, nontender, nondistended. No hepatosplenomegaly. No bruits or masses. Good bowel sounds. Extremities: no cyanosis, clubbing, rash, edema Neuro: alert & orientedx3, cranial nerves  grossly intact. moves all 4 extremities w/o difficulty. Affect pleasant  Telemetry: Vpaced  Labs: Basic Metabolic Panel:  Recent Labs Lab 01/27/14 1416 01/27/14 1435 01/27/14 1816 01/28/14 0629 01/29/14 0529 02/01/14 0305  NA 139  --   --  139 140 142  K 3.8  --   --  3.8 3.8 3.6*  CL 100  --   --  102 104 106  CO2 25  --   --  25 22 20   GLUCOSE 98  --   --  101* 85 93  BUN 19  --   --  14 14 9   CREATININE 1.12  --  0.92 1.07 1.15 1.03  CALCIUM 9.8  --  9.2 9.2 8.9 8.9  MG  --  2.1 2.0  --   --   --     Liver Function Tests:  Recent Labs Lab 02/01/14 0305  AST 77*  ALT 114*  ALKPHOS 69  BILITOT 0.5  PROT 7.2  ALBUMIN 3.5   No results found for this basename: LIPASE, AMYLASE,  in the last 168 hours No results found for this basename: AMMONIA,  in the last 168 hours  CBC:  Recent Labs Lab 01/27/14 1416 01/27/14 1816  WBC 8.5 9.8  HGB 16.4 16.1  HCT 47.2 46.3  MCV 85.7 85.4  PLT 246 249    Cardiac Enzymes:  Recent Labs  Lab 01/29/14 0529  TROPONINI <0.30    BNP: BNP (last 3 results)  Recent Labs  02/06/13 1551 01/27/14 1816  PROBNP 1620.0* 953.7*     Other results:  EKG:   Imaging: No results found.   Medications:     Scheduled Medications: . amiodarone  150 mg Intravenous Once  . carvedilol  25 mg Oral BID WC  . colchicine  0.6 mg Oral BID  . digoxin  0.125 mg Oral Daily  . dolutegravir  50 mg Oral Q lunch  . emtricitabine-tenofovir  1 tablet Oral Q lunch  . enoxaparin (LOVENOX) injection  40 mg Subcutaneous Q24H  . lisinopril  20 mg Oral Daily  . loperamide  4 mg Oral Once  . rilpivirine  25 mg Oral Q lunch  . spironolactone  12.5 mg Oral Daily    Infusions: . sodium chloride 5 mL/hr at 02/01/14 2000  . amiodarone 30 mg/hr (02/01/14 2100)    PRN Medications: acetaminophen, ALPRAZolam, loperamide, LORazepam, nitroGLYCERIN, ondansetron (ZOFRAN) IV, zolpidem   Assessment:   1) VTstorm/Vfib - shocked 13 times 2)  Chronic systolic HF - EF 16-10% 3) HIV 4) NICM 5) LBBB 6) CRT-D  Plan/Discussion:    Very well known to the HF team and is a very pleasant gentleman. Admitted for VT storm and was on lidocaine and had repeat VT and then was placed on amiodarone which continues. LHC yesterday showed normal coronaries and EF 15%.   Stable overnight. Dig level ok. Started Spiro 12.5 mg daily yesterday and BMET pending. Will wait to see what K+ is before titrating ACE-I  OOB and ambulate in the halls.   Length of Stay: 6  Aundria Rud 02/02/2014, 7:05 AM  Advanced Heart Failure Team Pager [phone] (M-F; 7a - 4p)  Please contact [facility] for night-coverage after hours (4p -7a ) and weekends on [email or web address]  Patient seen with NP, agree with the above note.  No VT overnight, had PVCs.  Vitals stable, awaiting BMET with addition of spironolactone.  Patient has BP room, thinks he could take Bidil.  Will add Bidil at 1/2 tab tid.  I think he can go to telemetry. Will defer amiodarone dosing changes to EP.   Marca Ancona 02/02/2014 7:38 AM

## 2014-02-02 NOTE — Progress Notes (Signed)
Report called and given to Fayrene Fearing on 2W. Awaiting to give po amio before pt transferred. Pt updated. Will continue to monitor.

## 2014-02-02 NOTE — Progress Notes (Signed)
Chaplain visited pt on today. Pt has improved and was transferred from [facility] to 2W. Chaplain continued to encouraged pt, prayed with pt and provided a listening ear.   Janece Canterbury, [address]

## 2014-02-02 NOTE — Progress Notes (Signed)
Patient ID: [name], male   DOB: 10/11/1965, 48 y.o.   MRN: [id number]   ELECTROPHYSIOLOGY ROUNDING NOTE    Patient Name: [name] Date of Encounter: 02/02/2014    SUBJECTIVE:less anxious, he wants to be transferred to tele. No ICD shocks. He has been sedated. Admitted 01-27-14 with VT/VF storm.  Loaded with IV Amiodarone and Lidocaine.  Lidocaine discontinued 01-29-14.  IV Amiodarone still infusing. Will switch to po  TELEMETRY: Reviewed telemetry pt AV pacing, no further ventricular arrhythmias Filed Vitals:   02/02/14 0500 02/02/14 0600 02/02/14 0700 02/02/14 0922  BP: 123/81 121/80 123/81   Pulse:    58  Temp:      TempSrc:      Resp:      Height:      Weight:      SpO2:   97%     Intake/Output Summary (Last 24 hours) at 02/02/14 0929 Last data filed at 02/02/14 0800  Gross per 24 hour  Intake  754.1 ml  Output      0 ml  Net  754.1 ml    CURRENT MEDICATIONS: . amiodarone  150 mg Intravenous Once  . carvedilol  25 mg Oral BID WC  . colchicine  0.6 mg Oral BID  . digoxin  0.125 mg Oral Daily  . dolutegravir  50 mg Oral Q lunch  . emtricitabine-tenofovir  1 tablet Oral Q lunch  . enoxaparin (LOVENOX) injection  40 mg Subcutaneous Q24H  . isosorbide-hydrALAZINE  0.5 tablet Oral TID  . lisinopril  20 mg Oral Daily  . rilpivirine  25 mg Oral Q lunch  . spironolactone  12.5 mg Oral Daily   . sodium chloride 5 mL/hr at 02/01/14 2000  . amiodarone 30 mg/hr (02/02/14 0800)     LABS: Basic Metabolic Panel:  Recent Labs  84/69/62 0305 02/02/14 0809  NA 142 141  K 3.6* 3.7  CL 106 104  CO2 20 22  GLUCOSE 93 92  BUN 9 9  CREATININE 1.03 1.16  CALCIUM 8.9 9.4   CBC: No results found for this basename: WBC, NEUTROABS, HGB, HCT, MCV, PLT,  in the last 72 hours Cardiac Enzymes: No results found for this basename: CKTOTAL, CKMB, CKMBINDEX, TROPONINI,  in the last 72 hours   Radiology/Studies:  Dg Chest Portable 1 View 01/27/2014   CLINICAL DATA:   Irregular heartbeat.  Loss of consciousness.  EXAM: PORTABLE CHEST - 1 VIEW  COMPARISON:  06/27/2013.  FINDINGS: Mediastinum and hilar structures normal. Cardiomegaly. Cardiac pacer with lead tips in right atrium and right ventricle. No CHF. No focal infiltrate. No pleural effusion or pneumothorax. No acute bony abnormality.  IMPRESSION: 1. Stable cardiomegaly.  Cardiac pacer.  No CHF. 2. No acute pulmonary disease.   Electronically Signed   By: Maisie Fus  Register   On: 01/27/2014 14:46    PHYSICAL EXAM stable appearing middle aged man, NAD HEENT: Unremarkable,[place], AT Neck:  7 JVD, no thyromegally Back:  No CVA tenderness Lungs:  Clear with no wheezes, rales, or rhonchi HEART:  Regular rate rhythm, no murmurs, no rubs, no clicks Abd:  soft, positive bowel sounds, no organomegally, no rebound, no guarding Ext:  2 plus pulses, no edema, no cyanosis, no clubbing. Right wrist without hematoma Skin:  No rashes no nodules Neuro:  CN II through XII intact, motor grossly intact    Active Problems:   Ventricular tachycardia, sustained   Ventricular fibrillation   Implantable cardioverter-defibrillator (ICD) discharge   Sustained VT (  ventricular tachycardia)  Appreciate CHF team input. Will transition to tele today and switch to po amio Kinzlee Selvy,M.D.

## 2014-02-03 DIAGNOSIS — R51 Headache: Secondary | ICD-10-CM

## 2014-02-03 DIAGNOSIS — F4310 Post-traumatic stress disorder, unspecified: Secondary | ICD-10-CM

## 2014-02-03 DIAGNOSIS — R110 Nausea: Secondary | ICD-10-CM

## 2014-02-03 DIAGNOSIS — I472 Ventricular tachycardia: Secondary | ICD-10-CM

## 2014-02-03 DIAGNOSIS — I4729 Other ventricular tachycardia: Secondary | ICD-10-CM

## 2014-02-03 DIAGNOSIS — R519 Headache, unspecified: Secondary | ICD-10-CM

## 2014-02-03 LAB — BASIC METABOLIC PANEL
ANION GAP: 11 (ref 5–15)
BUN: 10 mg/dL (ref 6–23)
CO2: 23 mEq/L (ref 19–32)
Calcium: 8.9 mg/dL (ref 8.4–10.5)
Chloride: 108 mEq/L (ref 96–112)
Creatinine, Ser: 1.18 mg/dL (ref 0.50–1.35)
GFR calc Af Amer: 82 mL/min — ABNORMAL LOW (ref >90)
GFR calc non Af Amer: 70 mL/min — ABNORMAL LOW (ref >90)
Glucose, Bld: 92 mg/dL (ref 70–99)
Potassium: 3.6 mEq/L — ABNORMAL LOW (ref 3.7–5.3)
SODIUM: 142 meq/L (ref 137–147)

## 2014-02-03 MED ORDER — TRAMADOL HCL 50 MG PO TABS
100.0000 mg | ORAL_TABLET | Freq: Four times a day (QID) | ORAL | Status: DC
  Administered 2014-02-04 (×3): 100 mg via ORAL
  Filled 2014-02-03 (×4): qty 2

## 2014-02-03 MED ORDER — HYDROCODONE-ACETAMINOPHEN 10-325 MG PO TABS
1.0000 | ORAL_TABLET | ORAL | Status: DC | PRN
  Administered 2014-02-03: 2 via ORAL
  Administered 2014-02-03 – 2014-02-04 (×2): 1 via ORAL
  Administered 2014-02-05: 2 via ORAL
  Administered 2014-02-05: 1 via ORAL
  Administered 2014-02-05 – 2014-02-06 (×2): 2 via ORAL
  Filled 2014-02-03 (×3): qty 2
  Filled 2014-02-03: qty 1
  Filled 2014-02-03 (×2): qty 2
  Filled 2014-02-03: qty 1

## 2014-02-03 MED ORDER — ISOSORB DINITRATE-HYDRALAZINE 20-37.5 MG PO TABS
1.0000 | ORAL_TABLET | Freq: Three times a day (TID) | ORAL | Status: DC
  Administered 2014-02-03 – 2014-02-05 (×7): 1 via ORAL
  Filled 2014-02-03 (×12): qty 1

## 2014-02-03 NOTE — Progress Notes (Signed)
Patient complaining and having anxiety about feeling his pacemaker pacing. Heart rate maintaining 53-58, going as low as 49. Pacer AV pacing. Patient requesting Xanax and Ambien at this time. MD notified. MD clarified to give 400 mg amiodarone and 0.5 mg of Xanax now and to spread the Ambien out from the other two medications due to low HR. May give Ambien in a bit. Will continue to monitor. Call bell within reach.  Valinda Hoar RN

## 2014-02-03 NOTE — Progress Notes (Signed)
Patient Name: [name]      SUBJECTIVE: some nausea since amio IV>>PO Anxiety re ICD Headache  Past Medical History  Diagnosis Date  . Hypertension   . HIV infection   . Hepatitis     Hep B and Hep C (patient does not report this but these are listed in previous notes.)  . G6PD deficiency   . Chronic systolic heart failure     a. Echo 12/05/11:  EF 20-25%, diff HK with mild sparing of IL wall, mild AI, mod MR, mild LAE, mild RVE, mild reduced RVF.;  b.  Echo 05/11/2012:  Mild LVH, EF 20-25%, Gr 1 diast dysfn, mod MR, mild LAE  . NICM (nonischemic cardiomyopathy)     cardia CTA 8/13 negative for obstructive CAD  . CHF (congestive heart failure)   . GERD (gastroesophageal reflux disease)   . Depression     Scheduled Meds:  Scheduled Meds: . amiodarone  400 mg Oral BID  . carvedilol  25 mg Oral BID WC  . colchicine  0.6 mg Oral BID  . digoxin  0.125 mg Oral Daily  . dolutegravir  50 mg Oral Q lunch  . emtricitabine-tenofovir  1 tablet Oral Q lunch  . enoxaparin (LOVENOX) injection  40 mg Subcutaneous Q24H  . isosorbide-hydrALAZINE  0.5 tablet Oral TID  . lisinopril  20 mg Oral Daily  . rilpivirine  25 mg Oral Q lunch  . spironolactone  12.5 mg Oral Daily   Continuous Infusions: . sodium chloride 5 mL/hr at 02/01/14 2000   acetaminophen, ALPRAZolam, loperamide, LORazepam, nitroGLYCERIN, ondansetron (ZOFRAN) IV, zolpidem    PHYSICAL EXAM Filed Vitals:   02/02/14 0930 02/02/14 1202 02/02/14 2048 02/03/14 0517  BP: 150/94 118/73 122/78 131/89  Pulse:  56 54 63  Temp:  97.9 F (36.6 C) 97.9 F (36.6 C) 97.9 F (36.6 C)  TempSrc:  Oral Oral Oral  Resp:  Height:      Weight:      SpO2:  97% 97% 97%    Well developed and nourished in no acute distress HENT normal Neck supple with JVP-flat Clear Regular rate and rhythm, no murmurs or gallops Abd-soft with active BS No Clubbing cyanosis edema Skin-warm and dry A & Oriented  Grossly  normal sensory and motor function   TELEMETRY: Reviewed telemetry pt in  No vt since 9/1:    Intake/Output Summary (Last 24 hours) at 02/03/14 0811 Last data filed at 02/02/14 1700  Gross per 24 hour  Intake 794.01 ml  Output    100 ml  Net 694.01 ml    LABS: Basic Metabolic Panel:  Recent Labs Lab 01/27/14 1416 01/27/14 1435 01/27/14 1816 01/28/14 0629 01/29/14 0529 02/01/14 0305 02/02/14 0809 02/03/14 0550  NA 139  --   --  139 140 142 141 142  K 3.8  --   --  3.8 3.8 3.6* 3.7 3.6*  CL 100  --   --  102 104 106 104 108  CO2 25  --   --  GLUCOSE 98  --   --  101* 85 93 92 92  BUN 19  --   --  CREATININE 1.12  --  0.92 1.07 1.15 1.03 1.16 1.18  CALCIUM 9.8  --  9.2 9.2 8.9 8.9 9.4 8.9  MG  --  2.1 2.0  --   --   --   --   --  Cardiac Enzymes: No results found for this basename: CKTOTAL, CKMB, CKMBINDEX, TROPONINI,  in the last 72 hours CBC:  Recent Labs Lab 01/27/14 1416 01/27/14 1816  WBC 8.5 9.8  HGB 16.4 16.1  HCT 47.2 46.3  MCV 85.7 85.4  PLT 246 249   PROTIME: No results found for this basename: LABPROT, INR,  in the last 72 hours Liver Function Tests:  Recent Labs  02/01/14 0305  AST 77*  ALT 114*  ALKPHOS 69  BILITOT 0.5  PROT 7.2  ALBUMIN 3.5   No results found for this basename: LIPASE, AMYLASE,  in the last 72 hours BNP: BNP (last 3 results)  Recent Labs  02/06/13 1551 01/27/14 1816  PROBNP 1620.0* 953.7*   D-Dimer: No results found for this basename: DDIMER,  in the last 72 hours Hemoglobin A1C: No results found for this basename: HGBA1C,  in the last 72 hours Fasting Lipid Panel: No results found for this basename: CHOL, HDL, LDLCALC, TRIG, CHOLHDL, LDLDIRECT,  in the last 72 hours Thyroid Function Tests:  Recent Labs  02/01/14 0305  TSH 2.170   Anemia Panel: No results found for this basename: VITAMINB12, FOLATE, FERRITIN, TIBC, IRON, RETICCTPCT,  in the last 72 hours Dig 0.4   continue Device Interrogation none   ASSESSMENT AND PLAN:  Principal Problem:   Ventricular tachycardia, polymorphic Active Problems:   Implantable cardioverter-defibrillator (ICD) discharge   Sustained VT (ventricular tachycardia)   Headache   Nausea   PTSD (post-traumatic stress disorder)  Will continue amio as current dose but may need to change intervals if nausea persists Presume HA is secondary to nitrates  Explained to pt  Will increase bidil PTSD  May need addressing euvolemic  Signed, Sherryl Manges MD  02/03/2014

## 2014-02-04 DIAGNOSIS — E876 Hypokalemia: Secondary | ICD-10-CM

## 2014-02-04 LAB — BASIC METABOLIC PANEL
Anion gap: 13 (ref 5–15)
BUN: 9 mg/dL (ref 6–23)
CO2: 25 mEq/L (ref 19–32)
Calcium: 9.2 mg/dL (ref 8.4–10.5)
Chloride: 105 mEq/L (ref 96–112)
Creatinine, Ser: 1 mg/dL (ref 0.50–1.35)
GFR calc Af Amer: >90 mL/min (ref >90)
GFR, EST NON AFRICAN AMERICAN: 86 mL/min — AB (ref >90)
Glucose, Bld: 99 mg/dL (ref 70–99)
Potassium: 3.3 mEq/L — ABNORMAL LOW (ref 3.7–5.3)
SODIUM: 143 meq/L (ref 137–147)

## 2014-02-04 LAB — LIPASE, BLOOD: LIPASE: 36 U/L (ref 11–59)

## 2014-02-04 LAB — HEPATIC FUNCTION PANEL
ALK PHOS: 69 U/L (ref 39–117)
ALT: 82 U/L — ABNORMAL HIGH (ref 0–53)
AST: 48 U/L — ABNORMAL HIGH (ref 0–37)
Albumin: 3.8 g/dL (ref 3.5–5.2)
BILIRUBIN TOTAL: 0.4 mg/dL (ref 0.3–1.2)
Total Protein: 7.5 g/dL (ref 6.0–8.3)

## 2014-02-04 LAB — AMYLASE: Amylase: 75 U/L (ref 0–105)

## 2014-02-04 MED ORDER — POTASSIUM CHLORIDE CRYS ER 20 MEQ PO TBCR
40.0000 meq | EXTENDED_RELEASE_TABLET | Freq: Two times a day (BID) | ORAL | Status: AC
Start: 2014-02-04 — End: 2014-02-05
  Administered 2014-02-04 – 2014-02-05 (×3): 40 meq via ORAL
  Filled 2014-02-04 (×2): qty 2

## 2014-02-04 MED ORDER — ONDANSETRON 8 MG/NS 50 ML IVPB
8.0000 mg | Freq: Four times a day (QID) | INTRAVENOUS | Status: DC
  Administered 2014-02-04 – 2014-02-05 (×3): 8 mg via INTRAVENOUS
  Filled 2014-02-04 (×5): qty 8

## 2014-02-04 MED ORDER — PROMETHAZINE HCL 25 MG/ML IJ SOLN
12.5000 mg | Freq: Four times a day (QID) | INTRAMUSCULAR | Status: DC | PRN
  Administered 2014-02-04 – 2014-02-05 (×3): 12.5 mg via INTRAVENOUS
  Filled 2014-02-04 (×3): qty 1

## 2014-02-04 MED ORDER — PROMETHAZINE HCL 25 MG PO TABS: 12.5000 mg | ORAL_TABLET | Freq: Four times a day (QID) | ORAL | Status: DC | PRN

## 2014-02-04 NOTE — Progress Notes (Signed)
Patient Name: [name]      SUBJECTIVE:  nausea better More animated Anxiety re ICD Headache   Past Medical History  Diagnosis Date  . Hypertension   . HIV infection   . Hepatitis     Hep B and Hep C (patient does not report this but these are listed in previous notes.)  . G6PD deficiency   . Chronic systolic heart failure     a. Echo 12/05/11:  EF 20-25%, diff HK with mild sparing of IL wall, mild AI, mod MR, mild LAE, mild RVE, mild reduced RVF.;  b.  Echo 05/11/2012:  Mild LVH, EF 20-25%, Gr 1 diast dysfn, mod MR, mild LAE  . NICM (nonischemic cardiomyopathy)     cardia CTA 8/13 negative for obstructive CAD  . CHF (congestive heart failure)   . GERD (gastroesophageal reflux disease)   . Depression     Scheduled Meds:  Scheduled Meds: . amiodarone  400 mg Oral BID  . carvedilol  25 mg Oral BID WC  . colchicine  0.6 mg Oral BID  . digoxin  0.125 mg Oral Daily  . dolutegravir  50 mg Oral Q lunch  . emtricitabine-tenofovir  1 tablet Oral Q lunch  . enoxaparin (LOVENOX) injection  40 mg Subcutaneous Q24H  . isosorbide-hydrALAZINE  1 tablet Oral TID  . lisinopril  20 mg Oral Daily  . rilpivirine  25 mg Oral Q lunch  . spironolactone  12.5 mg Oral Daily  . traMADol  100 mg Oral 4 times per day   Continuous Infusions: . sodium chloride 5 mL/hr at 02/01/14 2000   acetaminophen, ALPRAZolam, HYDROcodone-acetaminophen, loperamide, LORazepam, nitroGLYCERIN, ondansetron (ZOFRAN) IV, promethazine, promethazine, zolpidem    PHYSICAL EXAM Filed Vitals:   02/03/14 0517 02/03/14 2155 02/04/14 0023 02/04/14 0511  BP: 131/89 157/105 125/77 140/101  Pulse: 63 59 69 71  Temp: 97.9 F (36.6 C) 98 F (36.7 C)  98.2 F (36.8 C)  TempSrc: Oral Oral  Oral  Resp: 18 18  18   Height:      Weight:      SpO2: 97% 98%  96%    Well developed and nourished in no acute distress HENT normal Neck supple with JVP-flat Clear Regular rate and rhythm, no murmurs or  gallops Abd-soft with active BS No Clubbing cyanosis edema Skin-warm and dry A & Oriented  Grossly normal sensory and motor function   TELEMETRY: Reviewed telemetry pt in  No vt since 9/1:    Intake/Output Summary (Last 24 hours) at 02/04/14 1036 Last data filed at 02/03/14 1803  Gross per 24 hour  Intake    480 ml  Output      0 ml  Net    480 ml    LABS: Basic Metabolic Panel:  Recent Labs Lab 01/29/14 0529 02/01/14 0305 02/02/14 0809 02/03/14 0550 02/04/14 0435  NA 140 142 141 142 143  K 3.8 3.6* 3.7 3.6* 3.3*  CL 104 106 104 108 105  CO2 22 20 22 23 25   GLUCOSE 85 93 92 92 99  BUN 14 9 9 10 9   CREATININE 1.15 1.03 1.16 1.18 1.00  CALCIUM 8.9 8.9 9.4 8.9 9.2   Cardiac Enzymes: No results found for this basename: CKTOTAL, CKMB, CKMBINDEX, TROPONINI,  in the last 72 hours CBC: No results found for this basename: WBC, NEUTROABS, HGB, HCT, MCV, PLT,  in the last 168 hours PROTIME: No results found for this basename: LABPROT, INR,  in the last 72 hours Liver Function Tests: No results found for this basename: AST, ALT, ALKPHOS, BILITOT, PROT, ALBUMIN,  in the last 72 hours No results found for this basename: LIPASE, AMYLASE,  in the last 72 hours BNP: BNP (last 3 results)  Recent Labs  02/06/13 1551 01/27/14 1816  PROBNP 1620.0* 953.7*   D-Dimer: No results found for this basename: DDIMER,  in the last 72 hours Hemoglobin A1C: No results found for this basename: HGBA1C,  in the last 72 hours Fasting Lipid Panel: No results found for this basename: CHOL, HDL, LDLCALC, TRIG, CHOLHDL, LDLDIRECT,  in the last 72 hours Thyroid Function Tests: No results found for this basename: TSH, T4TOTAL, FREET3, T3FREE, THYROIDAB,  in the last 72 hours Anemia Panel: No results found for this basename: VITAMINB12, FOLATE, FERRITIN, TIBC, IRON, RETICCTPCT,  in the last 72 hours Dig 0.4  continue Device Interrogation none   ASSESSMENT AND PLAN:  Principal Problem:    Ventricular tachycardia, polymorphic Active Problems:   Implantable cardioverter-defibrillator (ICD) discharge   Sustained VT (ventricular tachycardia)   Headache   Nausea   PTSD (post-traumatic stress disorder)  toelrating amio   Discharge in am 400 bid x 7 day  400 qd x 2 weks F/u GT 3-4 weeks Continue bidil Need dig level at GT followup Replete K    Signed, Sherryl Manges MD  02/04/2014

## 2014-02-04 NOTE — Progress Notes (Signed)
Patient c/o of n/v, patient presumed it was the amidarone, i gave him morning meds, and staggered his amidorone pills to assess if it was causing n/v, he did not received his morning dose of amidarone when he started vomitting, MD notified, zofran increased. labs ordered, and US abdomen in am, continuing to monitor

## 2014-02-05 ENCOUNTER — Inpatient Hospital Stay (HOSPITAL_COMMUNITY): Payer: Medicare Other

## 2014-02-05 LAB — BASIC METABOLIC PANEL
ANION GAP: 12 (ref 5–15)
BUN: 9 mg/dL (ref 6–23)
CALCIUM: 9.2 mg/dL (ref 8.4–10.5)
CO2: 25 mEq/L (ref 19–32)
Chloride: 107 mEq/L (ref 96–112)
Creatinine, Ser: 1.09 mg/dL (ref 0.50–1.35)
GFR, EST AFRICAN AMERICAN: 90 mL/min — AB (ref >90)
GFR, EST NON AFRICAN AMERICAN: 77 mL/min — AB (ref >90)
GLUCOSE: 92 mg/dL (ref 70–99)
POTASSIUM: 4.5 meq/L (ref 3.7–5.3)
SODIUM: 144 meq/L (ref 137–147)

## 2014-02-05 MED ORDER — ONDANSETRON HCL 4 MG PO TABS
4.0000 mg | ORAL_TABLET | Freq: Two times a day (BID) | ORAL | Status: DC
  Administered 2014-02-05 – 2014-02-07 (×5): 4 mg via ORAL
  Filled 2014-02-05 (×6): qty 1

## 2014-02-05 NOTE — Progress Notes (Signed)
Patient ID: [name], male   DOB: [birth year], 48 y.o.   MRN: [id number]   Patient Name: [name] Date of Encounter: 02/05/2014     Principal Problem:   Ventricular tachycardia, polymorphic Active Problems:   Implantable cardioverter-defibrillator (ICD) discharge   Sustained VT (ventricular tachycardia)   Headache   Nausea   PTSD (post-traumatic stress disorder)   Hypokalemia    SUBJECTIVE  C/o nausea and vomiting CURRENT MEDS . amiodarone  400 mg Oral BID  . carvedilol  25 mg Oral BID WC  . colchicine  0.6 mg Oral BID  . digoxin  0.125 mg Oral Daily  . dolutegravir  50 mg Oral Q lunch  . emtricitabine-tenofovir  1 tablet Oral Q lunch  . enoxaparin (LOVENOX) injection  40 mg Subcutaneous Q24H  . isosorbide-hydrALAZINE  1 tablet Oral TID  . lisinopril  20 mg Oral Daily  . ondansetron (ZOFRAN) IV  8 mg Intravenous 4 times per day  . potassium chloride  40 mEq Oral BID  . rilpivirine  25 mg Oral Q lunch  . spironolactone  12.5 mg Oral Daily  . traMADol  100 mg Oral 4 times per day    OBJECTIVE  Filed Vitals:   02/04/14 1951 02/04/14 2233 02/04/14 2300 02/05/14 0435  BP: 153/108 150/99 131/83 146/84  Pulse: 63 57 53 58  Temp: 98.2 F (36.8 C)   98.4 F (36.9 C)  TempSrc: Oral   Oral  Resp: 18   18  Height:      Weight:      SpO2: 99%   97%    Intake/Output Summary (Last 24 hours) at 02/05/14 0839 Last data filed at 02/05/14 0436  Gross per 24 hour  Intake    390 ml  Output    100 ml  Net    290 ml   Filed Weights   01/27/14 2200 02/02/14 0300  Weight: 154 lb 15.7 oz (70.3 kg) 153 lb 7 oz (69.6 kg)    PHYSICAL EXAM  General: Pleasant, anxious appearing, NAD. Neuro: Alert and oriented X 3. Moves all extremities spontaneously. Psych: Normal affect  Neck: Supple without bruits or JVD. Lungs:  Resp regular and unlabored, CTA. Heart: RRR no s3, s4, or murmurs. Abdomen: Soft, non-tender, non-distended, BS + x 4.  Extremities: No clubbing,  cyanosis or edema. DP/PT/Radials 2+ and equal bilaterally.  Accessory Clinical Findings  CBC No results found for this basename: WBC, NEUTROABS, HGB, HCT, MCV, PLT,  in the last 72 hours Basic Metabolic Panel  Recent Labs  02/04/14 0435 02/05/14 0528  NA 143 144  K 3.3* 4.5  CL 105 107  CO2 25 25  GLUCOSE 99 92  BUN 9 9  CREATININE 1.00 1.09  CALCIUM 9.2 9.2   Liver Function Tests  Recent Labs  02/04/14 0451  AST 48*  ALT 82*  ALKPHOS 69  BILITOT 0.4  PROT 7.5  ALBUMIN 3.8    Recent Labs  02/04/14 0451  LIPASE 36  AMYLASE 75   Cardiac Enzymes No results found for this basename: CKTOTAL, CKMB, CKMBINDEX, TROPONINI,  in the last 72 hours BNP No components found with this basename: POCBNP,  D-Dimer No results found for this basename: DDIMER,  in the last 72 hours Hemoglobin A1C No results found for this basename: HGBA1C,  in the last 72 hours Fasting Lipid Panel No results found for this basename: CHOL, HDL, LDLCALC, TRIG, CHOLHDL, LDLDIRECT,  in the last 72 hours Thyroid Function Tests  No results found for this basename: TSH, T4TOTAL, FREET3, T3FREE, THYROIDAB,  in the last 72 hours  TELE  nsr with ventricular pacing  Radiology/Studies  Dg Chest Portable 1 View  01/27/2014   CLINICAL DATA:  Irregular heartbeat.  Loss of consciousness.  EXAM: PORTABLE CHEST - 1 VIEW  COMPARISON:  06/27/2013.  FINDINGS: Mediastinum and hilar structures normal. Cardiomegaly. Cardiac pacer with lead tips in right atrium and right ventricle. No CHF. No focal infiltrate. No pleural effusion or pneumothorax. No acute bony abnormality.  IMPRESSION: 1. Stable cardiomegaly.  Cardiac pacer.  No CHF. 2. No acute pulmonary disease.   Electronically Signed   By: Maisie Fus  Register   On: 01/27/2014 14:46    ASSESSMENT AND PLAN 1. VT storm 2. Non-ischemic CM 3. Nausea on oral amiodarone 4. Chronic systolic heart failure Rec: At this point, his nausea is uncontrolled and I am concerned  that he will not be able to take oral amio. Will give round the clock Zofran and see how his nausea does. Hopefully he will be ready for discharge tomorrow.  [name],M.D.  02/05/2014 8:39 AM

## 2014-02-06 LAB — BASIC METABOLIC PANEL
Anion gap: 15 (ref 5–15)
BUN: 9 mg/dL (ref 6–23)
CALCIUM: 9 mg/dL (ref 8.4–10.5)
CO2: 22 mEq/L (ref 19–32)
Chloride: 103 mEq/L (ref 96–112)
Creatinine, Ser: 1.09 mg/dL (ref 0.50–1.35)
GFR, EST AFRICAN AMERICAN: 90 mL/min — AB (ref >90)
GFR, EST NON AFRICAN AMERICAN: 77 mL/min — AB (ref >90)
Glucose, Bld: 90 mg/dL (ref 70–99)
Potassium: 3.7 mEq/L (ref 3.7–5.3)
SODIUM: 140 meq/L (ref 137–147)

## 2014-02-06 MED ORDER — HYDRALAZINE HCL 25 MG PO TABS
25.0000 mg | ORAL_TABLET | Freq: Three times a day (TID) | ORAL | Status: DC
  Administered 2014-02-06 – 2014-02-07 (×5): 25 mg via ORAL
  Filled 2014-02-06 (×7): qty 1

## 2014-02-06 NOTE — Progress Notes (Signed)
Patient ID: ACEN CRAUN, male   DOB: 1966-03-19, 48 y.o.   MRN: [id number]   Patient Name: [name] Date of Encounter: 02/06/2014     Principal Problem:   Ventricular tachycardia, polymorphic Active Problems:   Implantable cardioverter-defibrillator (ICD) discharge   Sustained VT (ventricular tachycardia)   Headache   Nausea   PTSD (post-traumatic stress disorder)   Hypokalemia    SUBJECTIVE  C/o nausea and vomiting but better, still with HA CURRENT MEDS . amiodarone  400 mg Oral BID  . carvedilol  25 mg Oral BID WC  . colchicine  0.6 mg Oral BID  . dolutegravir  50 mg Oral Q lunch  . emtricitabine-tenofovir  1 tablet Oral Q lunch  . enoxaparin (LOVENOX) injection  40 mg Subcutaneous Q24H  . hydrALAZINE  25 mg Oral 3 times per day  . lisinopril  20 mg Oral Daily  . ondansetron  4 mg Oral Q12H  . rilpivirine  25 mg Oral Q lunch  . spironolactone  12.5 mg Oral Daily    OBJECTIVE  Filed Vitals:   02/05/14 1448 02/05/14 1728 02/05/14 2041 02/06/14 0421  BP: 135/80 146/91 141/89 116/71  Pulse: 57 57 62 59  Temp: 97.8 F (36.6 C)  98.2 F (36.8 C) 98.6 F (37 C)  TempSrc: Oral  Oral Oral  Resp: Height:      Weight:      SpO2: 95%  98% 97%    Intake/Output Summary (Last 24 hours) at 02/06/14 0814 Last data filed at 02/05/14 0900  Gross per 24 hour  Intake    240 ml  Output      0 ml  Net    240 ml   Filed Weights   01/27/14 2200 02/02/14 0300  Weight: 154 lb 15.7 oz (70.3 kg) 153 lb 7 oz (69.6 kg)    PHYSICAL EXAM  General: Pleasant, less anxious appearing, NAD. Neuro: Alert and oriented X 3. Moves all extremities spontaneously.  Neck: Supple without bruits or JVD. Lungs:  Resp regular and unlabored, CTA. Heart: RRR no s3, s4, or murmurs. Abdomen: Soft, non-tender, non-distended, BS + x 4.  Extremities: No clubbing, cyanosis or edema. DP/PT/Radials 2+ and equal bilaterally.  Accessory Clinical Findings  CBC No results found  for this basename: WBC, NEUTROABS, HGB, HCT, MCV, PLT,  in the last 72 hours Basic Metabolic Panel  Recent Labs  02/05/14 0528 02/06/14 0439  NA 144 140  K 4.5 3.7  CL 107 103  CO2 25 22  GLUCOSE 92 90  BUN 9 9  CREATININE 1.09 1.09  CALCIUM 9.2 9.0   Liver Function Tests  Recent Labs  02/04/14 0451  AST 48*  ALT 82*  ALKPHOS 69  BILITOT 0.4  PROT 7.5  ALBUMIN 3.8    Recent Labs  02/04/14 0451  LIPASE 36  AMYLASE 75   Cardiac Enzymes No results found for this basename: CKTOTAL, CKMB, CKMBINDEX, TROPONINI,  in the last 72 hours BNP No components found with this basename: POCBNP,  D-Dimer No results found for this basename: DDIMER,  in the last 72 hours Hemoglobin A1C No results found for this basename: HGBA1C,  in the last 72 hours Fasting Lipid Panel No results found for this basename: CHOL, HDL, LDLCALC, TRIG, CHOLHDL, LDLDIRECT,  in the last 72 hours Thyroid Function Tests No results found for this basename: TSH, T4TOTAL, FREET3, T3FREE, THYROIDAB,  in the last 72 hours  TELE  nsr with  ventricular pacing  Radiology/Studies  Dg Chest Portable 1 View  01/27/2014   CLINICAL DATA:  Irregular heartbeat.  Loss of consciousness.  EXAM: PORTABLE CHEST - 1 VIEW  COMPARISON:  06/27/2013.  FINDINGS: Mediastinum and hilar structures normal. Cardiomegaly. Cardiac pacer with lead tips in right atrium and right ventricle. No CHF. No focal infiltrate. No pleural effusion or pneumothorax. No acute bony abnormality.  IMPRESSION: 1. Stable cardiomegaly.  Cardiac pacer.  No CHF. 2. No acute pulmonary disease.   Electronically Signed   By: Maisie Fus  Register   On: 01/27/2014 14:46    ASSESSMENT AND PLAN 1. VT storm - resolved on amiodarone 2. Non-ischemic CM 3. Nausea on oral amiodarone 4. Chronic systolic heart failure, class 2, EF15% 5. HA Rec: At this point, his nausea is better although I am still concerned about his ability to take oral amio. Will give oral Zofran and  see how his nausea does. Hopefully he will be ready for discharge tomorrow. Also, he has HA and I will stop his BiDil and switch to hydralazine to see if HA improves. Will ambulate.  Gregg Taylor,M.D.  02/06/2014 8:14 AM

## 2014-02-06 NOTE — Progress Notes (Signed)
In to follow up with patient at bedside.  He is still concerned about his personal care aide services being requested.  Indicated that his PCP has sent the request to [facility].  His nausea has largely subsided and he is looking forward to lunch.  Will continue to monitor.  Of note, [facility] Care Management services does not replace or interfere with any services that are arranged by inpatient case management or social work.  For additional questions or referrals please contact Anibal Henderson BSN RN [facility] Liaison at [phone].

## 2014-02-07 ENCOUNTER — Other Ambulatory Visit: Payer: Self-pay | Admitting: Physician Assistant

## 2014-02-07 DIAGNOSIS — I5022 Chronic systolic (congestive) heart failure: Secondary | ICD-10-CM

## 2014-02-07 LAB — CLOSTRIDIUM DIFFICILE BY PCR: CDIFFPCR: NEGATIVE

## 2014-02-07 MED ORDER — AMIODARONE HCL 400 MG PO TABS: 400.0000 mg | ORAL_TABLET | Freq: Every day | ORAL | Status: DC

## 2014-02-07 MED ORDER — SPIRONOLACTONE 12.5 MG HALF TABLET: 12.5000 mg | ORAL_TABLET | Freq: Every day | ORAL | Status: DC

## 2014-02-07 MED ORDER — HYDRALAZINE HCL 25 MG PO TABS: 25.0000 mg | ORAL_TABLET | Freq: Three times a day (TID) | ORAL | Status: DC

## 2014-02-07 MED ORDER — FUROSEMIDE 40 MG PO TABS: 40.0000 mg | ORAL_TABLET | Freq: Two times a day (BID) | ORAL | Status: DC

## 2014-02-07 MED ORDER — ONDANSETRON HCL 4 MG PO TABS: 4.0000 mg | ORAL_TABLET | Freq: Two times a day (BID) | ORAL | Status: DC

## 2014-02-07 MED ORDER — AMIODARONE HCL 400 MG PO TABS: 400.0000 mg | ORAL_TABLET | Freq: Two times a day (BID) | ORAL | Status: DC

## 2014-02-07 MED ORDER — CARVEDILOL 25 MG PO TABS: 25.0000 mg | ORAL_TABLET | Freq: Two times a day (BID) | ORAL | Status: DC

## 2014-02-07 MED ORDER — LISINOPRIL 20 MG PO TABS: 20.0000 mg | ORAL_TABLET | Freq: Every day | ORAL | Status: DC

## 2014-02-07 NOTE — Progress Notes (Signed)
Patient ID: [name], male   DOB: 08-16-1965, 48 y.o.   MRN: [id number]   Patient Name: [name] Date of Encounter: 02/07/2014     Principal Problem:   Ventricular tachycardia, polymorphic Active Problems:   Implantable cardioverter-defibrillator (ICD) discharge   Sustained VT (ventricular tachycardia)   Headache   Nausea   PTSD (post-traumatic stress disorder)   Hypokalemia    SUBJECTIVE No chest pain, nausea, or HA. "I am ready to go home".  CURRENT MEDS . amiodarone  400 mg Oral BID  . carvedilol  25 mg Oral BID WC  . colchicine  0.6 mg Oral BID  . dolutegravir  50 mg Oral Q lunch  . emtricitabine-tenofovir  1 tablet Oral Q lunch  . enoxaparin (LOVENOX) injection  40 mg Subcutaneous Q24H  . hydrALAZINE  25 mg Oral 3 times per day  . lisinopril  20 mg Oral Daily  . ondansetron  4 mg Oral Q12H  . rilpivirine  25 mg Oral Q lunch  . spironolactone  12.5 mg Oral Daily    OBJECTIVE  Filed Vitals:   02/06/14 0421 02/06/14 1453 02/06/14 2015 02/07/14 0446  BP: 116/71 128/87 133/87 149/97  Pulse: 59 57 60 68  Temp: 98.6 F (37 C) 98.3 F (36.8 C) 98.4 F (36.9 C) 98 F (36.7 C)  TempSrc: Oral Oral Oral Oral  Resp: 18 18 18 18   Height:      Weight:    155 lb 9.6 oz (70.58 kg)  SpO2: 97% 99% 98% 97%    Intake/Output Summary (Last 24 hours) at 02/07/14 1253 Last data filed at 02/06/14 2000  Gross per 24 hour  Intake    360 ml  Output    150 ml  Net    210 ml   Filed Weights   01/27/14 2200 02/02/14 0300 02/07/14 0446  Weight: 154 lb 15.7 oz (70.3 kg) 153 lb 7 oz (69.6 kg) 155 lb 9.6 oz (70.58 kg)    PHYSICAL EXAM  General: Pleasant, middle aged man, NAD. Neuro: Alert and oriented X 3. Moves all extremities spontaneously. Psych: Normal affect. HEENT:  Normal  Neck: Supple without bruits or JVD. Lungs:  Resp regular and unlabored, CTA. Heart: RRR no s3, s4, or murmurs. Abdomen: Soft, non-tender, non-distended, BS + x 4.  Extremities: No  clubbing, cyanosis or edema. DP/PT/Radials 2+ and equal bilaterally.  Accessory Clinical Findings  CBC No results found for this basename: WBC, NEUTROABS, HGB, HCT, MCV, PLT,  in the last 72 hours Basic Metabolic Panel  Recent Labs  02/05/14 0528 02/06/14 0439  NA 144 140  K 4.5 3.7  CL 107 103  CO2 25 22  GLUCOSE 92 90  BUN 9 9  CREATININE 1.09 1.09  CALCIUM 9.2 9.0   Liver Function Tests No results found for this basename: AST, ALT, ALKPHOS, BILITOT, PROT, ALBUMIN,  in the last 72 hours No results found for this basename: LIPASE, AMYLASE,  in the last 72 hours Cardiac Enzymes No results found for this basename: CKTOTAL, CKMB, CKMBINDEX, TROPONINI,  in the last 72 hours BNP No components found with this basename: POCBNP,  D-Dimer No results found for this basename: DDIMER,  in the last 72 hours Hemoglobin A1C No results found for this basename: HGBA1C,  in the last 72 hours Fasting Lipid Panel No results found for this basename: CHOL, HDL, LDLCALC, TRIG, CHOLHDL, LDLDIRECT,  in the last 72 hours Thyroid Function Tests No results found for this basename: TSH,  T4TOTAL, FREET3, T3FREE, THYROIDAB,  in the last 72 hours  TELE  NSR with BiV Pacing  Radiology/Studies  Dg Chest Portable 1 View  01/27/2014   CLINICAL DATA:  Irregular heartbeat.  Loss of consciousness.  EXAM: PORTABLE CHEST - 1 VIEW  COMPARISON:  06/27/2013.  FINDINGS: Mediastinum and hilar structures normal. Cardiomegaly. Cardiac pacer with lead tips in right atrium and right ventricle. No CHF. No focal infiltrate. No pleural effusion or pneumothorax. No acute bony abnormality.  IMPRESSION: 1. Stable cardiomegaly.  Cardiac pacer.  No CHF. 2. No acute pulmonary disease.   Electronically Signed   By: Maisie Fus  Register   On: 01/27/2014 14:46   US Abdomen Limited Ruq  02/05/2014   CLINICAL DATA:  Projectile vomiting.  EXAM: US ABDOMEN LIMITED - RIGHT UPPER QUADRANT  COMPARISON:  None.  FINDINGS: Gallbladder:  No  gallstones or wall thickening. No sonographic Murphy sign. Small 2 mm polyp along the gallbladder wall.  Common bile duct:  Diameter: 4 mm, within normal limits.  Liver:  No focal lesion identified. Within normal limits in parenchymal echogenicity.  Additional finding:  Small right pleural effusion.  IMPRESSION: 1. No findings to explain the patient's projectile vomiting. 2. Small right pleural effusion.   Electronically Signed   By: Leanna Battles M.D.   On: 02/05/2014 16:09    ASSESSMENT AND PLAN  1. VT storm - now on amiodarone for nearly 10 days. He appears to be loaded. I would like him to go home on 400 mg twice a day for 3 days, then 400 mg daily.  2. Acute on chronic systolic heart failure - he appears euvolemic. He will continue his current meds. He could not take bidil due to HA from long acting ntg.  3. HIV positive - stable on anti-retroviral agents. 4. Nausea - this was initially severe but is now improved. I would like him to go home on zofran by mouth.  5. Disp. - ok for discharge home. He has followup already scheduled with Dr. Dorthea Cove. I would like to see him in 4-6 weeks. I would anticipated he be on 400 mg daily of amio for 3-6 months.  Sten Dematteo,M.D.  02/07/2014 12:53 PM

## 2014-02-07 NOTE — Progress Notes (Signed)
Discharge instructions reviewed with patient. Patient verbalized understanding. Questions regarding medications addressed as well. Patient verbalized understanding of checking his weights and when to notify his MD.  Bradley Ferris RN BSN 02/07/2014 3:01 PM

## 2014-02-07 NOTE — Discharge Summary (Signed)
Discharge Summary   Patient ID: [name] MRN: [id number], DOB/AGE: 01/25/1966 48 y.o. Admit date: 01/27/2014 D/C date:     02/07/2014  Primary Cardiologist: Dr. Ladona Ridgel  Principal Problem:   Ventricular tachycardia, polymorphic Active Problems:   HIV DISEASE   HEPATITIS C   HYPERTENSION   Alcohol abuse   Cardiomyopathy   Chronic systolic heart failure   GERD (gastroesophageal reflux disease)   ICD (implantable cardioverter-defibrillator), biventricular, in situ   Implantable cardioverter-defibrillator (ICD) discharge   Sustained VT (ventricular tachycardia)   Headache   Nausea   PTSD (post-traumatic stress disorder)   Hypokalemia   Admission Dates: 01/27/14-02/07/14 Discharge Diagnosis: VT storm s/p amiodarone loading.   HPI: [name] is a 48 y.o. male with a history of HIV 1998, HCV, ETOH abuse, HTN, chronic LBBB and CHF due to NICM s/p [employer] CRT-D (06/2013) who was admitted on 01-27-14 with VT storm.  He also has a h/o syncope and previously had an EP study which was non-inducible for VT. Over the last several weeks, he has had recurrent episodes of ventricular tachycardia, that quickly degenerates, requiring ICD shock. He presented to the emergency room on 01/27/14 with recurrent VT/VF. Previously, the patient had had multiple shocks and had been placed on sotalol. This had no effect on his propensity for ventricular arrhythmia, and could possibly have been proarrhythmic, although it is uncertain. He reports taking all of his medicines as prescribed.   Hospital Course  VT storm- shocked 13 times total.  -- He was loaded with IV Amiodarone and lidocaine. Lidocaine discontinued 01-29-14. IV Amiodarone continued for ~ 72 hours and transitioned to oral amiodarone therapy. The patient did have multiple episodes of VT/VF up until 60s hours after initiation of IV Amio. He was converted to oral amiodarone  BID on 02/02/14. -- Patient had had a cardiac CT scan done 2  years ago which demonstrated some calcium but not obstructive CAD. While progression of CAD was thought to be unlikely, the patient continued to have polymorphic VT and so a LHC was planned to rule out ischemia as the trigger.  -- He underwent LHC on 01/31/14 which reveald    1. Normal coronary arteries.   2. Severely reduced LV systolic function with an ejection fraction of 10-15% with mild-moderate mitral regurgitation.   3. Mildly elevated left ventricular end-diastolic pressure. -- Now on amiodarone for nearly 10 days and appears to be loaded. He will go home on 400 mg twice a day for 3 days, then 400 mg daily.  He is anticipated to be on 400 mg daily of amio for ~3-6 months.  Chronic systolic CHF- EF 40-98% by cath on this admission -- Cardiac cath did not show CAD but did demonstrate worsening LV function. Over the past 2 years, his EF has dropped from 35-40% down to 10-15%. -- He was seen by AHF team on 02/01/14 who suspected a component of myocardial stunning 2/2 multiple shocks. He was started on spironolactone 12.5 mg daily => long-term will lower risk of ventricular arrhythmias. His K has been stable.  -- Continue current lisinopril  and Coreg  BID.  -- Digoxin was discontinued 02/05/14.  -- He was also placed on Bidil, which was later discontinued due to intractable headache. He was then started on hydralazine  TID.  -- He appears euvolemic and has not required diruesis. Will resume his home dose of Lasix  BID at discharge. He will continue his current meds and follow up with  the CHF clinic in November.  -- Follow up BMET in clinic in 1 week.   HIV positive - stable on anti-retroviral agents.   Nausea - this was initially severe but is now improved. He had projectile vomiting. An abdominal US was performed which returned normal.  -- He will go home on Zofran  BID   The patient has had an complicated and protracted hospital course but is recovering well. The radial  catheter site is stable. He has been seen by Dr. Ladona Ridgel today and deemed ready for discharge home. All follow-up appointments have been scheduled.  Discharge medications are listed below.    Discharge Vitals: Blood pressure 149/97, pulse 68, temperature 98 F (36.7 C), temperature source Oral, resp. rate 18, height  (1.651 m), weight 155 lb 9.6 oz (70.58 kg), SpO2 97.00%.  Labs: Lab Results  Component Value Date   WBC 9.8 01/27/2014   HGB 16.1 01/27/2014   HCT 46.3 01/27/2014   MCV 85.4 01/27/2014   PLT 249 01/27/2014    Recent Labs Lab 02/04/14 0451  02/06/14 0439  NA  --   < > 140  K  --   < > 3.7  CL  --   < > 103  CO2  --   < > 22  BUN  --   < > 9  CREATININE  --   < > 1.09  CALCIUM  --   < > 9.0  PROT 7.5  --   --   BILITOT 0.4  --   --   ALKPHOS 69  --   --   ALT 82*  --   --   AST 48*  --   --   GLUCOSE  --   < > 90  < > = values in this interval not displayed.     Diagnostic Studies/Procedures   Dg Chest Portable 1 View  01/27/2014   CLINICAL DATA:  Irregular heartbeat.  Loss of consciousness.  EXAM: PORTABLE CHEST - 1 VIEW  COMPARISON:  06/27/2013.  FINDINGS: Mediastinum and hilar structures normal. Cardiomegaly. Cardiac pacer with lead tips in right atrium and right ventricle. No CHF. No focal infiltrate. No pleural effusion or pneumothorax. No acute bony abnormality.  IMPRESSION: 1. Stable cardiomegaly.  Cardiac pacer.  No CHF. 2. No acute pulmonary disease.    US Abdomen Limited Ruq  02/05/2014   CLINICAL DATA:  Projectile vomiting.  EXAM: US ABDOMEN LIMITED - RIGHT UPPER QUADRANT  COMPARISON:  None.  FINDINGS: Gallbladder:  No gallstones or wall thickening. No sonographic Murphy sign. Small 2 mm polyp along the gallbladder wall.  Common bile duct:  Diameter: 4 mm, within normal limits.  Liver:  No focal lesion identified. Within normal limits in parenchymal echogenicity.  Additional finding:  Small right pleural effusion.  IMPRESSION: 1. No findings to explain  the patient's projectile vomiting. 2. Small right pleural effusion.       01/31/14: Cardiac Catheterization Procedure Note  Name: [name]  MRN: [id number]  DOB: 08-03-1965  Procedure: Left Heart Cath, Selective Coronary Angiography, LV angiography  Indication: Cardiomyopathy with sustained ventricular tachycardia  Medications:  Sedation: 2 mg IV Versed, 25 mcg IV Fentanyl  Contrast: 60 mL Omnipaque  Procedural Details: The right wrist was prepped, draped, and anesthetized with 1% lidocaine. Using the modified Seldinger technique, a 5 French sheath was introduced into the right radial artery. 3 mg of verapamil was administered through the sheath, weight-based unfractionated heparin was administered intravenously. A  Jackie catheter was used for selective coronary angiography. A pigtail catheter was used for left ventriculography. Catheter exchanges were performed over an exchange length guidewire. There were no immediate procedural complications. A TR band was used for radial hemostasis at the completion of the procedure. The patient was transferred to the post catheterization recovery area for further monitoring.  Procedural Findings:  Hemodynamics:  AO: 144/92 mmHg  LV: 138/8 mmHg  LVEDP: 16 mmHg  Coronary angiography:  Coronary dominance: Right  Left Main: Normal.  Left Anterior Descending (LAD): Normal.  1st diagonal (D1): Normal  2nd diagonal (D2): Normal  3rd diagonal (D3): Small in size and normal.  Circumflex (LCx): Normal in size and nondominant. No significant disease.  1st obtuse marginal: Small in size with no significant disease.  2nd obtuse marginal: Normal in size with no disease.  3rd obtuse marginal: Normal in size with no significant disease.  AV groove continuation segment: Small in size with no significant disease.  Right Coronary Artery: Very large in size and dominant. The vessel is free of significant disease.  Posterior descending artery: very large with no  significant disease.  Posterior AV segment: Normal  Posterolateral branchs: Normal Left ventriculography: Left ventricular systolic function is severely reduced , LVEF is estimated at 10-15 %, there is Mild-moderate mitral regurgitation  Final Conclusions:  1. Normal coronary arteries.  2. Severely reduced LV systolic function with an ejection fraction of 10-15% with mild-moderate mitral regurgitation.  3. Mildly elevated left ventricular end-diastolic pressure.   Discharge Medications     Medication List    STOP taking these medications       digoxin 0.25 MG tablet  Commonly known as:  LANOXIN     sotalol 120 MG tablet  Commonly known as:  BETAPACE      TAKE these medications       amiodarone 400 MG tablet  Commonly known as:  PACERONE  Take 1 tablet (400 mg total) by mouth 2 (two) times daily.     amiodarone 400 MG tablet  Commonly known as:  PACERONE  Take 1 tablet (400 mg total) by mouth daily.     carvedilol 25 MG tablet  Commonly known as:  COREG  Take 1 tablet (25 mg total) by mouth 2 (two) times daily with a meal.     colchicine 0.6 MG tablet  Take 1 tablet (0.6 mg total) by mouth 2 (two) times daily.     Emtricitab-Rilpivir-Tenofovir 200-25-300 MG Tabs  Take 1 tablet by mouth daily with lunch.     furosemide 40 MG tablet  Commonly known as:  LASIX  Take 1 tablet (40 mg total) by mouth 2 (two) times daily.     hydrALAZINE 25 MG tablet  Commonly known as:  APRESOLINE  Take 1 tablet (25 mg total) by mouth 3 (three) times daily.     lisinopril 20 MG tablet  Commonly known as:  PRINIVIL,ZESTRIL  Take 1 tablet (20 mg total) by mouth daily.     ondansetron 4 MG tablet  Commonly known as:  ZOFRAN  Take 1 tablet (4 mg total) by mouth 2 (two) times daily.     spironolactone 12.5 mg Tabs tablet  Commonly known as:  ALDACTONE  Take 0.5 tablets (12.5 mg total) by mouth daily.     TIVICAY 50 MG tablet  Generic drug:  dolutegravir  Take 50 mg by mouth daily  with lunch.     valACYclovir 1000 MG tablet  Commonly known as:  VALTREX  Take 1,000 mg by mouth 2 (two) times daily as needed (outbreaks). Take for 5 days for an outbreak        Disposition   The patient will be discharged in stable condition to home.  Follow-up Information   Follow up with Lewayne Bunting, MD On 02/28/2014. (@ 9am )    Specialty:  Cardiology   Contact information:   [address] [place] [address] [phone]       Follow up with [facility] On 02/14/2014. ([address] for lab work [employer]) to check your potassium between 8am and 4pm. )    Contact information:   [address] [place] [address] [phone]        Duration of Discharge Encounter: Greater than 30 minutes including physician and PA time.  Byrd Hesselbach R PA-C 02/07/2014, 2:29 PM   EP Attending  Patient seen and examined. Agree with above. Ready for discharge.  [name]

## 2014-02-07 NOTE — Progress Notes (Signed)
Patient had questions regarding discharge medications. Per patient, he was taken off Sotalol due to that being the reason he was admitted into the hospital? Patient was encouraged to discuss concerns with MD or PA. RN will also try to follow up with the PA prior to discharge summary being completed. Bradley Ferris RN BSN 02/07/2014 1:42 PM

## 2014-02-08 ENCOUNTER — Telehealth: Payer: Self-pay | Admitting: Internal Medicine

## 2014-02-08 NOTE — Telephone Encounter (Signed)
New message  ° ° °Patient calling has questions regarding medication.  °

## 2014-02-08 NOTE — Telephone Encounter (Signed)
Discussed with Dr Ladona Ridgel and states it's okay  Do not take the pm dose and then go back to regular dosing tomorrow

## 2014-02-13 ENCOUNTER — Encounter: Payer: Medicare Other | Admitting: Internal Medicine

## 2014-02-14 ENCOUNTER — Other Ambulatory Visit (INDEPENDENT_AMBULATORY_CARE_PROVIDER_SITE_OTHER): Payer: Medicare Other

## 2014-02-14 DIAGNOSIS — I509 Heart failure, unspecified: Secondary | ICD-10-CM

## 2014-02-14 DIAGNOSIS — I5022 Chronic systolic (congestive) heart failure: Secondary | ICD-10-CM | POA: Diagnosis not present

## 2014-02-15 LAB — BASIC METABOLIC PANEL
BUN: 13 mg/dL (ref 6–23)
CHLORIDE: 105 meq/L (ref 96–112)
CO2: 27 meq/L (ref 19–32)
CREATININE: 1.3 mg/dL (ref 0.4–1.5)
Calcium: 9.5 mg/dL (ref 8.4–10.5)
GFR: 76.3 mL/min (ref >60.00)
GLUCOSE: 99 mg/dL (ref 70–99)
POTASSIUM: 3.4 meq/L — AB (ref 3.5–5.1)
Sodium: 142 mEq/L (ref 135–145)

## 2014-02-20 ENCOUNTER — Ambulatory Visit (INDEPENDENT_AMBULATORY_CARE_PROVIDER_SITE_OTHER): Payer: Medicare Other | Admitting: Internal Medicine

## 2014-02-20 ENCOUNTER — Encounter: Payer: Self-pay | Admitting: Internal Medicine

## 2014-02-20 ENCOUNTER — Telehealth: Payer: Self-pay | Admitting: Internal Medicine

## 2014-02-20 VITALS — BP 91/67 | HR 75 | Temp 99.6°F | Wt 156.0 lb

## 2014-02-20 DIAGNOSIS — Z113 Encounter for screening for infections with a predominantly sexual mode of transmission: Secondary | ICD-10-CM | POA: Diagnosis not present

## 2014-02-20 DIAGNOSIS — I429 Cardiomyopathy, unspecified: Secondary | ICD-10-CM

## 2014-02-20 DIAGNOSIS — B20 Human immunodeficiency virus [HIV] disease: Secondary | ICD-10-CM

## 2014-02-20 DIAGNOSIS — Z23 Encounter for immunization: Secondary | ICD-10-CM | POA: Diagnosis not present

## 2014-02-20 DIAGNOSIS — I428 Other cardiomyopathies: Secondary | ICD-10-CM | POA: Diagnosis not present

## 2014-02-20 NOTE — Assessment & Plan Note (Signed)
Doing great from this end.  RTC 4 months.

## 2014-02-20 NOTE — Assessment & Plan Note (Signed)
Stable despite advanced disease.  Taking cardiac meds.  With dizziness and low BP, I will check with heart failure team to see if any adjustments in meds indicated.  ICD has not fired since.

## 2014-02-20 NOTE — Telephone Encounter (Signed)
FYI   per [facility] Dr Comer's office called  [phone]  .............     Bp 91/67  // Notes in Epic. They wanted to let this office know maybe pt needs to come in sooner.

## 2014-02-20 NOTE — Telephone Encounter (Addendum)
Discussed with Dr Ladona Ridgel to continue current medications and keep his follow up next week.

## 2014-02-20 NOTE — Progress Notes (Signed)
   Subjective:    Patient ID: [name], male    DOB: 10/08/1965, 48 y.o.   MRN: [id number]  HPI Here for follow up of his HIV.  He has been well controlled and last CD4 550 with undetectable vl.  No issues with HIV.   For his advanced CM, he has recently been hospitalized after VT multiple times.  Loaded with amio and sent out for 3-6 months of amiodarone.  Feels tired, weak, dizzy.  Some constipation and now diarrhea after too much laxative.  BP low.  Had cath during admission and EF 10-15%.  Stopped digoxin.  BMET 9/16 ok with potassium of 3.4.  Has continued to abstain from marijuana and alcohol.  Going to church regularly.  I have completed paperwork for home aide.     Review of Systems  Constitutional: Positive for activity change and fatigue. Negative for fever.  HENT: Negative for trouble swallowing.   Respiratory: Negative for cough and shortness of breath.   Cardiovascular: Negative for leg swelling.  Gastrointestinal: Positive for diarrhea. Negative for nausea and abdominal pain.  Skin: Negative for rash.  Neurological: Positive for light-headedness.       Objective:   Physical Exam  Constitutional: He appears well-developed and well-nourished. No distress.  HENT:  Mouth/Throat: No oropharyngeal exudate.  Eyes: No scleral icterus.  Cardiovascular: Normal rate, regular rhythm and normal heart sounds.   No murmur heard. Pulmonary/Chest: Effort normal and breath sounds normal. No respiratory distress. He has no wheezes. He has no rales.  Lymphadenopathy:    He has no cervical adenopathy.  Skin: No rash noted.          Assessment & Plan:

## 2014-02-21 ENCOUNTER — Telehealth: Payer: Self-pay | Admitting: *Deleted

## 2014-02-21 NOTE — Progress Notes (Signed)
Thanks

## 2014-02-21 NOTE — Telephone Encounter (Signed)
Initiated another referral to [facility] for personal care services. This was done initially on 02/01/14 and was told it was never received. Advised to check back in 48 hours to make sure it was received at [phone] ext. [phone] or [phone].

## 2014-02-22 ENCOUNTER — Ambulatory Visit (HOSPITAL_COMMUNITY)
Admission: RE | Admit: 2014-02-22 | Discharge: 2014-02-22 | Disposition: A | Discharge status: Home / Self Care | Payer: Medicare Other | Source: Ambulatory Visit | Attending: Internal Medicine | Admitting: Internal Medicine

## 2014-02-22 ENCOUNTER — Encounter (HOSPITAL_COMMUNITY): Payer: Self-pay

## 2014-02-22 VITALS — BP 82/56 | HR 62 | Resp 18 | Wt 154.5 lb

## 2014-02-22 DIAGNOSIS — N179 Acute kidney failure, unspecified: Secondary | ICD-10-CM | POA: Diagnosis not present

## 2014-02-22 DIAGNOSIS — K5289 Other specified noninfective gastroenteritis and colitis: Secondary | ICD-10-CM | POA: Diagnosis not present

## 2014-02-22 DIAGNOSIS — E860 Dehydration: Secondary | ICD-10-CM | POA: Diagnosis not present

## 2014-02-22 DIAGNOSIS — I959 Hypotension, unspecified: Secondary | ICD-10-CM | POA: Diagnosis not present

## 2014-02-22 DIAGNOSIS — R5383 Other fatigue: Secondary | ICD-10-CM

## 2014-02-22 DIAGNOSIS — I472 Ventricular tachycardia, unspecified: Secondary | ICD-10-CM | POA: Insufficient documentation

## 2014-02-22 DIAGNOSIS — Z4502 Encounter for adjustment and management of automatic implantable cardiac defibrillator: Secondary | ICD-10-CM

## 2014-02-22 DIAGNOSIS — I5022 Chronic systolic (congestive) heart failure: Secondary | ICD-10-CM | POA: Diagnosis not present

## 2014-02-22 DIAGNOSIS — I509 Heart failure, unspecified: Secondary | ICD-10-CM | POA: Insufficient documentation

## 2014-02-22 DIAGNOSIS — R5381 Other malaise: Secondary | ICD-10-CM | POA: Insufficient documentation

## 2014-02-22 DIAGNOSIS — I4729 Other ventricular tachycardia: Secondary | ICD-10-CM | POA: Diagnosis not present

## 2014-02-22 DIAGNOSIS — Z0389 Encounter for observation for other suspected diseases and conditions ruled out: Secondary | ICD-10-CM

## 2014-02-22 DIAGNOSIS — D551 Anemia due to other disorders of glutathione metabolism: Secondary | ICD-10-CM | POA: Diagnosis not present

## 2014-02-22 DIAGNOSIS — B20 Human immunodeficiency virus [HIV] disease: Secondary | ICD-10-CM | POA: Diagnosis not present

## 2014-02-22 DIAGNOSIS — F1010 Alcohol abuse, uncomplicated: Secondary | ICD-10-CM | POA: Diagnosis not present

## 2014-02-22 LAB — BASIC METABOLIC PANEL
Anion gap: 19 — ABNORMAL HIGH (ref 5–15)
BUN: 29 mg/dL — AB (ref 6–23)
CALCIUM: 9.2 mg/dL (ref 8.4–10.5)
CO2: 18 meq/L — AB (ref 19–32)
Chloride: 105 mEq/L (ref 96–112)
Creatinine, Ser: 1.72 mg/dL — ABNORMAL HIGH (ref 0.50–1.35)
GFR calc Af Amer: 52 mL/min — ABNORMAL LOW (ref >90)
GFR, EST NON AFRICAN AMERICAN: 45 mL/min — AB (ref >90)
GLUCOSE: 110 mg/dL — AB (ref 70–99)
Potassium: 3.9 mEq/L (ref 3.7–5.3)
Sodium: 142 mEq/L (ref 137–147)

## 2014-02-22 MED ORDER — DOLUTEGRAVIR SODIUM 50 MG PO TABS: 50.0000 mg | ORAL_TABLET | Freq: Every day | ORAL | Status: DC

## 2014-02-22 MED ORDER — EMTRICITAB-RILPIVIR-TENOFOV DF 200-25-300 MG PO TABS: 1.0000 | ORAL_TABLET | Freq: Every day | ORAL | Status: DC

## 2014-02-22 MED ORDER — COLCHICINE 0.6 MG PO TABS: 0.6000 mg | ORAL_TABLET | Freq: Two times a day (BID) | ORAL | Status: DC

## 2014-02-22 MED ORDER — VALACYCLOVIR HCL 1 G PO TABS: 1000.0000 mg | ORAL_TABLET | Freq: Two times a day (BID) | ORAL | Status: DC | PRN

## 2014-02-22 NOTE — Patient Instructions (Signed)
Hold your lasix for Friday and Saturday and drink some extra fluids. On Sunday restart lasix 40 mg (1 tablet) twice a day.  Cut your hydralazine back to 12.5 mg (1/2 tablet) three times a day. In a week if you are feeling better go back to 25 mg (1 tablet) three times a day.  Come by next Tuesday to get labs.  F/U 1 month  Do the following things EVERYDAY: 1) Weigh yourself in the morning before breakfast. Write it down and keep it in a log. 2) Take your medicines as prescribed 3) Eat low salt foods-Limit salt (sodium) to 2000 mg per day.  4) Stay as active as you can everyday 5) Limit all fluids for the day to less than 2 liters 6)

## 2014-02-22 NOTE — Addendum Note (Signed)
Encounter addended by: Ave Filter, RN on: 02/22/2014  4:39 PM<BR>     Documentation filed: Notes Section

## 2014-02-22 NOTE — Addendum Note (Signed)
Encounter addended by: Ave Filter, RN on: 02/22/2014  1:32 PM<BR>     Documentation filed: Orders

## 2014-02-22 NOTE — Progress Notes (Addendum)
Patient ID: [name], male   DOB: 30-Oct-1965, 48 y.o.   MRN: [id number] ---Medtronic Optivol ----   Referring Physician: Dr. Jens Som Primary Physician: [name], MD  Primary Cardiologist: Dr Ladona Ridgel    HPI: [name] is a 48 y.o. male with a history of HIV 1998, HCV, ETOH abuse and CHF due to NICM s/p Medtronic  CRT-D (06/2013). He also has a h/o syncope and previously had an EP study which was non-inducible for VT.    Was admitted 01/19/13- 01/22/13 for acute on chronic systolic HF. ECHO EF 20-25%. He was diuresed with IV lasix. He was discharged on coreg 6.25 mg BID, digoxin 0.25 mg daily, lisinopril 40 mg daily, and spironolactone 25 mg daily. Discharge weight 150 lbs. He stopped spironolactone due to severe headaches.  He was not able to tolerate titration of Coreg to 25 mg bid due to lightheadedness.   Acute HF visit: Since last visit was admitted for VT storm. Underwent LHC which revealed normal coronaries. Went home on 9/8 with amiodarone. Has not had any shocks and since Monday has not been feeling well. Started having stomach aches and cramps like he was constipated. Took Linezz and made him have a bowel movement, but now feels like his stomach is still hurting and cramping. Has not been eating much. Denies SOB, PND, orthopnea or CP. Weight at home 154 lbs.   ECHO 04/2013: EF 20-25%, mod/severe MR, LA mod dilated ECHO 10/16/13 EF 20-25%   Labs (12/14): digoxin 0.5 Labs (2/15): K 4.2, creatinine 0.88 Labs (08/2013): K 4.2 Creatinine 1.02  Optivol: Activy ~4 hours  Fluid below threshold. VT noted Device interrogated to by Medtronic.   SH: Lives with partner in [place] Drinks 2 beers per day.   ROS: All systems negative except as listed in HPI, PMH and Problem List.  Past Medical History  Diagnosis Date  . Hypertension   . HIV infection   . Hepatitis     Hep B and Hep C (patient does not report this but these are listed in previous notes.)  . G6PD deficiency    . Chronic systolic heart failure     a. Echo 12/05/11:  EF 20-25%, diff HK with mild sparing of IL wall, mild AI, mod MR, mild LAE, mild RVE, mild reduced RVF.;  b.  Echo 05/11/2012:  Mild LVH, EF 20-25%, Gr 1 diast dysfn, mod MR, mild LAE  . NICM (nonischemic cardiomyopathy)     cardia CTA 8/13 negative for obstructive CAD  . CHF (congestive heart failure)   . GERD (gastroesophageal reflux disease)   . Depression     Current Outpatient Prescriptions  Medication Sig Dispense Refill  . amiodarone (PACERONE) 400 MG tablet Take 1 tablet (400 mg total) by mouth 2 (two) times daily.  6 tablet  0  . carvedilol (COREG) 25 MG tablet Take 1 tablet (25 mg total) by mouth 2 (two) times daily with a meal.  60 tablet  3  . colchicine 0.6 MG tablet Take 1 tablet (0.6 mg total) by mouth 2 (two) times daily.  30 tablet  2  . dolutegravir (TIVICAY) 50 MG tablet Take 50 mg by mouth daily with lunch.      . Emtricitab-Rilpivir-Tenofovir 200-25-300 MG TABS Take 1 tablet by mouth daily with lunch.      . furosemide (LASIX) 40 MG tablet Take 1 tablet (40 mg total) by mouth 2 (two) times daily.  135 tablet  3  . hydrALAZINE (APRESOLINE) 25  MG tablet Take 1 tablet (25 mg total) by mouth 3 (three) times daily.  90 tablet  3  . Linaclotide (LINZESS) 145 MCG CAPS capsule Take 145 mcg by mouth daily.      Marland Kitchen lisinopril (PRINIVIL,ZESTRIL) 20 MG tablet Take 1 tablet (20 mg total) by mouth daily.  30 tablet  11  . ondansetron (ZOFRAN) 4 MG tablet Take 1 tablet (4 mg total) by mouth 2 (two) times daily.  60 tablet  3  . spironolactone (ALDACTONE) 12.5 mg TABS tablet Take 0.5 tablets (12.5 mg total) by mouth daily.  30 tablet  3  . valACYclovir (VALTREX) 1000 MG tablet Take 1,000 mg by mouth 2 (two) times daily as needed (outbreaks). Take for 5 days for an outbreak       No current facility-administered medications for this encounter.    Filed Vitals:   02/22/14 1025  BP: 82/56  Pulse: 62  Resp: 18  Weight: 154 lb 8  oz (70.081 kg)  SpO2: 97%    Physical Exam:  General: Fatigued appearing, NAD.  HEENT: normal. + multiple earrings/studs  Neck: supple. JVP flat. Carotids 2+ bilat; no bruits. No lymphadenopathy or thryomegaly appreciated.  Cor: PMI laterally displaced. RRR, 2/6 MR  Lungs: CTA Abdomen: soft, nontender, distended. No hepatosplenomegaly. No bruits or masses. Good bowel sounds.  Extremities: no cyanosis, clubbing, rash, edema  Neuro: alert & oriented x 3, cranial nerves grossly intact. moves all 4 extremities w/o difficulty. Affect pleasant.  ASSESSMENT & PLAN:  1) Chronic systolic HF: Nonischemic cardiomyopathy, ?due to ETOH abuse. EF 25% (09/2013) s/p Medtronic CRT-D (06/2013).  - Patient recently admitted for VT storm and was started on Amiodarone. Underwent LHC which showed normal coronaries.  - NYHA I-II symptoms and he appears dry. Optivol interrogated: Fluid way below threshold and thoracic impedence elevated, no afib pt activint 6 hrs a day and 100 BiV pacing. Continue lasix 40 mg BID, but will hold today.  - Continue coreg 25 mg BID, on goal dose. - Continue lisinopril 40 mg daily and spironolactone 25 mg daily. - Will cut hydralazine to 12.5 mg (1/2 tablet) TID for now with hypotension likely in the setting of dehydration from gastroenteritis. Will try to increase back to 25 mg daily once he gets over illness. - Unable to tolerate Imdur d/t headaches.  - Reinforced the need and importance of daily weights, a low sodium diet, and fluid restriction (less than 2 L a day). Instructed to call the HF clinic if weight increases more than 3 lbs overnight or 5 lbs in a week.  2) ETOH abuse:  Drinks on occasional beer or wine a day. Encouraged to continue to try and abstain completely.  3) HTN: hypotension. As above cut hydralazine back.  4) Vtach: Patient recently admitted for VT storm. He is now on amiodarone and has not had any more shocks. Will continue 400 mg daily, but will cut back to 200  mg BID. Followed by Dr. Ladona Ridgel. 5. Gastroenteritis: - Patient reports his stomach has been cramping and he has had diarrhea since Sunday. He is not able to drink much fluids and volume status down and he is hypotensive today. As above cut hydralazine back to 12.5 mg TID and will give 250 cc of NS. If continues to have abdominal issues will need to do further work-up, maybe related to amiodarone.    Follow up in 1 month, BMET next week  Aundria Rud NP-C  02/22/2014  Addendum: Patient BMET reviewed  and creatinine elevated and BUN up. BUN 29, creatinine 1.72. He received 500 NS bolus of NS. Will hold lasix for 2 days. Repeat BMET next Tuesday.

## 2014-02-22 NOTE — Progress Notes (Signed)
Patient added on to today's clinic schedule for low BP, fatigue, weakness.  PIV started in RAC, 22g, 1 attempt, tolerated well.  500cc NS fluid blous given.  No significant change in BP, up to 86/60, but patient felt better.  PIV removed before he left clinic visit.  1 month appointment made, encouraged to call us if he began feeling bad again.  Aware and agreeable. Ave Filter

## 2014-02-23 ENCOUNTER — Telehealth: Payer: Self-pay | Admitting: *Deleted

## 2014-02-23 ENCOUNTER — Encounter (HOSPITAL_COMMUNITY): Payer: Medicare Other

## 2014-02-23 NOTE — Telephone Encounter (Signed)
Referral received and home visit scheduled for today.

## 2014-02-23 NOTE — Telephone Encounter (Signed)
Pt received multiple shocks on 01/27/14, went to ER. Per pt, he was admitted for 11 days. Amiodarone started and being taken as directed. Pt's home monitor had not sent a transmission although pt has left it plugged in. I had pt send a manual transmission---all episodes are appropriate. Pt also now being seen by HF clinic.   Pt's primary concern for this phone call was to verify his Melburn Hake is working properly. ROV w/ Dr. Ladona Ridgel 02/28/14.

## 2014-02-27 DIAGNOSIS — Z79899 Other long term (current) drug therapy: Secondary | ICD-10-CM | POA: Diagnosis not present

## 2014-02-28 ENCOUNTER — Encounter: Payer: Self-pay | Admitting: Internal Medicine

## 2014-02-28 ENCOUNTER — Ambulatory Visit (INDEPENDENT_AMBULATORY_CARE_PROVIDER_SITE_OTHER): Payer: Medicare Other | Admitting: Internal Medicine

## 2014-02-28 VITALS — BP 104/68 | HR 64 | Ht 64.5 in | Wt 156.2 lb

## 2014-02-28 DIAGNOSIS — I4729 Other ventricular tachycardia: Secondary | ICD-10-CM | POA: Diagnosis not present

## 2014-02-28 DIAGNOSIS — I472 Ventricular tachycardia: Secondary | ICD-10-CM

## 2014-02-28 DIAGNOSIS — I428 Other cardiomyopathies: Secondary | ICD-10-CM

## 2014-02-28 DIAGNOSIS — I429 Cardiomyopathy, unspecified: Secondary | ICD-10-CM

## 2014-02-28 DIAGNOSIS — Z95810 Presence of automatic (implantable) cardiac defibrillator: Secondary | ICD-10-CM

## 2014-02-28 DIAGNOSIS — I5022 Chronic systolic (congestive) heart failure: Secondary | ICD-10-CM | POA: Diagnosis not present

## 2014-02-28 LAB — BASIC METABOLIC PANEL
BUN: 12 mg/dL (ref 6–23)
CALCIUM: 9.1 mg/dL (ref 8.4–10.5)
CO2: 28 mEq/L (ref 19–32)
Chloride: 105 mEq/L (ref 96–112)
Creatinine, Ser: 1.2 mg/dL (ref 0.4–1.5)
GFR: 86.33 mL/min (ref >60.00)
GLUCOSE: 89 mg/dL (ref 70–99)
Potassium: 3.3 mEq/L — ABNORMAL LOW (ref 3.5–5.1)
Sodium: 140 mEq/L (ref 135–145)

## 2014-02-28 LAB — MDC_IDC_ENUM_SESS_TYPE_INCLINIC
Battery Remaining Longevity: 94 mo
Brady Statistic AP VP Percent: 3.4 %
Brady Statistic AP VS Percent: 0.07 %
Brady Statistic AS VP Percent: 94.96 %
Brady Statistic RA Percent Paced: 3.48 %
Brady Statistic RV Percent Paced: 11.9 %
Date Time Interrogation Session: 20150930093739
HighPow Impedance: 285 Ohm
HighPow Impedance: 68 Ohm
Lead Channel Impedance Value: 456 Ohm
Lead Channel Impedance Value: 532 Ohm
Lead Channel Impedance Value: 893 Ohm
Lead Channel Pacing Threshold Amplitude: 0.5 V
Lead Channel Pacing Threshold Pulse Width: 0.4 ms
Lead Channel Pacing Threshold Pulse Width: 0.4 ms
Lead Channel Sensing Intrinsic Amplitude: 27 mV
Lead Channel Setting Pacing Amplitude: 2.5 V
Lead Channel Setting Pacing Pulse Width: 0.4 ms
MDC IDC MSMT BATTERY VOLTAGE: 3 V
MDC IDC MSMT LEADCHNL LV IMPEDANCE VALUE: 532 Ohm
MDC IDC MSMT LEADCHNL LV PACING THRESHOLD PULSEWIDTH: 0.4 ms
MDC IDC MSMT LEADCHNL RA PACING THRESHOLD AMPLITUDE: 0.75 V
MDC IDC MSMT LEADCHNL RA SENSING INTR AMPL: 1.75 mV
MDC IDC MSMT LEADCHNL RV IMPEDANCE VALUE: 589 Ohm
MDC IDC MSMT LEADCHNL RV PACING THRESHOLD AMPLITUDE: 0.75 V
MDC IDC SET LEADCHNL LV PACING AMPLITUDE: 1.75 V
MDC IDC SET LEADCHNL LV PACING PULSEWIDTH: 0.4 ms
MDC IDC SET LEADCHNL RA PACING AMPLITUDE: 2 V
MDC IDC SET LEADCHNL RV SENSING SENSITIVITY: 0.3 mV
MDC IDC SET ZONE DETECTION INTERVAL: 300 ms
MDC IDC STAT BRADY AS VS PERCENT: 1.57 %
Zone Setting Detection Interval: 350 ms
Zone Setting Detection Interval: 350 ms
Zone Setting Detection Interval: 360 ms

## 2014-02-28 MED ORDER — AMIODARONE HCL 400 MG PO TABS: 400.0000 mg | ORAL_TABLET | Freq: Every day | ORAL | Status: DC

## 2014-02-28 MED ORDER — FUROSEMIDE 40 MG PO TABS: 20.0000 mg | ORAL_TABLET | Freq: Every day | ORAL | Status: DC

## 2014-02-28 NOTE — Progress Notes (Signed)
This encounter was created in error - please disregard.

## 2014-02-28 NOTE — Assessment & Plan Note (Signed)
He has had no recurrent ventricular arrhythmias since leaving the hospital on amiodarone. He will continue his current meds. I plan to reduce amio to 300 mg daily in 3 months if no VT.

## 2014-02-28 NOTE — Assessment & Plan Note (Signed)
His medtronic device is working normally. Will recheck in several months.  

## 2014-02-28 NOTE — Assessment & Plan Note (Signed)
His symptoms are class 2. He will continue his current meds except reduce his dose of lasix to 40 mg daily.

## 2014-02-28 NOTE — Patient Instructions (Signed)
Your physician has recommended you make the following change in your medication:  1) DECREASE Furosemide to 20 mg daily  Your physician recommends that you schedule a follow-up appointment in: 3 months with Dr. Ladona Ridgel.   Lab today: BMET

## 2014-02-28 NOTE — Progress Notes (Signed)
HPI [name] returns today for followup. He was hospitalized several weeks ago with VT storm and received multiple ICD shocks. No CAD at cath. His EF has gradually gone down. He was placed on amiodarone. In the interim, he has stopped smoking THC and stopped drinking. He has had no ICD shocks. He actually got dehydrated and his lasix was held.  Allergies  Allergen Reactions  . Bactrim Rash     Current Outpatient Prescriptions  Medication Sig Dispense Refill  . amiodarone (PACERONE) 400 MG tablet Take 400 mg by mouth daily.      . carvedilol (COREG) 25 MG tablet Take 1 tablet (25 mg total) by mouth 2 (two) times daily with a meal.  60 tablet  3  . colchicine 0.6 MG tablet Take 1 tablet (0.6 mg total) by mouth 2 (two) times daily.  60 tablet  0  . dolutegravir (TIVICAY) 50 MG tablet Take 1 tablet (50 mg total) by mouth daily with lunch.  30 tablet  0  . Emtricitab-Rilpivir-Tenofovir 200-25-300 MG TABS Take 1 tablet by mouth daily with lunch.  30 tablet  0  . furosemide (LASIX) 40 MG tablet Take 1 tablet (40 mg total) by mouth 2 (two) times daily.  135 tablet  3  . hydrALAZINE (APRESOLINE) 25 MG tablet Take 12.5 mg by mouth 3 (three) times daily.      . Linaclotide (LINZESS) 145 MCG CAPS capsule Take 145 mcg by mouth daily.      Marland Kitchen lisinopril (PRINIVIL,ZESTRIL) 20 MG tablet Take 1 tablet (20 mg total) by mouth daily.  30 tablet  11  . ondansetron (ZOFRAN) 4 MG tablet Take 1 tablet (4 mg total) by mouth 2 (two) times daily.  60 tablet  3  . spironolactone (ALDACTONE) 12.5 mg TABS tablet Take 0.5 tablets (12.5 mg total) by mouth daily.  30 tablet  3  . valACYclovir (VALTREX) 1000 MG tablet Take 1 tablet (1,000 mg total) by mouth 2 (two) times daily as needed (outbreaks). Take for 5 days for an outbreak  10 tablet  0  . zolpidem (AMBIEN) 5 MG tablet Take 5 mg by mouth at bedtime as needed for sleep.       No current facility-administered medications for this visit.     Past Medical  History  Diagnosis Date  . Hypertension   . HIV infection   . Hepatitis     Hep B and Hep C (patient does not report this but these are listed in previous notes.)  . G6PD deficiency   . Chronic systolic heart failure     a. Echo 12/05/11:  EF 20-25%, diff HK with mild sparing of IL wall, mild AI, mod MR, mild LAE, mild RVE, mild reduced RVF.;  b.  Echo 05/11/2012:  Mild LVH, EF 20-25%, Gr 1 diast dysfn, mod MR, mild LAE  . NICM (nonischemic cardiomyopathy)     cardia CTA 8/13 negative for obstructive CAD  . CHF (congestive heart failure)   . GERD (gastroesophageal reflux disease)   . Depression     ROS:   All systems reviewed and negative except as noted in the HPI.   Past Surgical History  Procedure Laterality Date  . Finger surgery      Thumb laceration.    . Esophagogastroduodenoscopy  12/07/2011    Procedure: ESOPHAGOGASTRODUODENOSCOPY (EGD);  Surgeon: [name], MD,FACG;  Location: [facility] ENDOSCOPY;  Service: Endoscopy;  Laterality: N/A;     Family History  Problem  Relation Age of Onset  . Cancer Mother      History   Social History  . Marital Status: Single    Spouse Name: N/A    Number of Children: N/A  . Years of Education: N/A   Occupational History  . Unemployed    Social History Main Topics  . Smoking status: Never Smoker   . Smokeless tobacco: Never Used  . Alcohol Use: No     Comment: cutting back   . Drug Use: No     Comment: marijuana daily  . Sexual Activity: Yes    Partners: Male     Comment: 28 year relationship, declined condoms   Other Topics Concern  . Not on file   Social History Narrative   Lives alone.  Drinks beer daily.  Liquor rarely.  He occasionally smokes marijuana..     BP 104/68  Pulse 64  Ht 5' 4.5" (1.638 m)  Wt 156 lb 3.2 oz (70.852 kg)  BMI 26.41 kg/m2  Physical Exam:  Well appearing middle aged man, NAD HEENT: Unremarkable Neck:  No JVD, no thyromegally Back:  No CVA tenderness Lungs:  Clear with no  wheezes HEART:  Regular rate rhythm, no murmurs, no rubs, no clicks Abd:  soft, positive bowel sounds, no organomegally, no rebound, no guarding Ext:  2 plus pulses, no edema, no cyanosis, no clubbing Skin:  No rashes no nodules Neuro:  CN II through XII intact, motor grossly intact   DEVICE  Normal device function.  See PaceArt for details.   Assess/Plan:

## 2014-03-02 ENCOUNTER — Encounter: Payer: Self-pay | Admitting: Internal Medicine

## 2014-03-02 ENCOUNTER — Telehealth: Payer: Self-pay | Admitting: *Deleted

## 2014-03-02 DIAGNOSIS — E876 Hypokalemia: Secondary | ICD-10-CM

## 2014-03-02 MED ORDER — POTASSIUM CHLORIDE CRYS ER 20 MEQ PO TBCR: 20.0000 meq | EXTENDED_RELEASE_TABLET | Freq: Two times a day (BID) | ORAL | Status: DC

## 2014-03-02 NOTE — Telephone Encounter (Signed)
He will start Potassium bid and repeat labs on 03/15/14

## 2014-03-13 ENCOUNTER — Other Ambulatory Visit: Payer: Self-pay | Admitting: Cardiology

## 2014-03-13 DIAGNOSIS — Z79899 Other long term (current) drug therapy: Secondary | ICD-10-CM | POA: Diagnosis not present

## 2014-03-15 ENCOUNTER — Other Ambulatory Visit (INDEPENDENT_AMBULATORY_CARE_PROVIDER_SITE_OTHER): Payer: Medicare Other | Admitting: *Deleted

## 2014-03-15 DIAGNOSIS — E876 Hypokalemia: Secondary | ICD-10-CM

## 2014-03-15 LAB — BASIC METABOLIC PANEL
BUN: 13 mg/dL (ref 6–23)
CHLORIDE: 104 meq/L (ref 96–112)
CO2: 29 mEq/L (ref 19–32)
Calcium: 9.3 mg/dL (ref 8.4–10.5)
Creatinine, Ser: 1.3 mg/dL (ref 0.4–1.5)
GFR: 78.4 mL/min (ref >60.00)
Glucose, Bld: 103 mg/dL — ABNORMAL HIGH (ref 70–99)
Potassium: 3.8 mEq/L (ref 3.5–5.1)
SODIUM: 140 meq/L (ref 135–145)

## 2014-03-16 ENCOUNTER — Other Ambulatory Visit: Payer: Self-pay | Admitting: *Deleted

## 2014-03-16 ENCOUNTER — Telehealth: Payer: Self-pay | Admitting: *Deleted

## 2014-03-16 ENCOUNTER — Other Ambulatory Visit: Payer: Self-pay | Admitting: Internal Medicine

## 2014-03-16 MED ORDER — ZOLPIDEM TARTRATE 5 MG PO TABS: 5.0000 mg | ORAL_TABLET | Freq: Every evening | ORAL | Status: DC | PRN

## 2014-03-16 NOTE — Telephone Encounter (Signed)
Rx called, patient aware

## 2014-03-16 NOTE — Telephone Encounter (Signed)
Ok.  ambien 5 mg qhs nightly for sleep, #30, 3 refills.  Needs to be called in I think.  thanks

## 2014-03-16 NOTE — Telephone Encounter (Signed)
Patient called requesting an Rx for Ambien that was previously prescribed while he was in the hospital. He is having difficulty sleeping. Please advise; patient uses [facility]. Told patient someone would call him back and let him no if it was approved.

## 2014-03-20 ENCOUNTER — Encounter: Payer: Self-pay | Admitting: *Deleted

## 2014-03-20 NOTE — Patient Instructions (Signed)
New form for personal care services faxed to [facility] at [phone]. Form needed to be updated with new ICD 10 codes. Confirmation received and form placed upfront to be scanned.

## 2014-03-23 ENCOUNTER — Ambulatory Visit (HOSPITAL_COMMUNITY)
Admission: RE | Admit: 2014-03-23 | Discharge: 2014-03-23 | Disposition: A | Discharge status: Home / Self Care | Payer: Medicare Other | Source: Ambulatory Visit | Attending: Cardiology | Admitting: Cardiology

## 2014-03-23 ENCOUNTER — Encounter (HOSPITAL_COMMUNITY): Payer: Self-pay

## 2014-03-23 VITALS — BP 110/77 | HR 63 | Wt 161.5 lb

## 2014-03-23 DIAGNOSIS — F329 Major depressive disorder, single episode, unspecified: Secondary | ICD-10-CM | POA: Insufficient documentation

## 2014-03-23 DIAGNOSIS — I10 Essential (primary) hypertension: Secondary | ICD-10-CM | POA: Diagnosis not present

## 2014-03-23 DIAGNOSIS — B20 Human immunodeficiency virus [HIV] disease: Secondary | ICD-10-CM | POA: Insufficient documentation

## 2014-03-23 DIAGNOSIS — I4729 Other ventricular tachycardia: Secondary | ICD-10-CM

## 2014-03-23 DIAGNOSIS — Z79899 Other long term (current) drug therapy: Secondary | ICD-10-CM | POA: Diagnosis not present

## 2014-03-23 DIAGNOSIS — I5022 Chronic systolic (congestive) heart failure: Secondary | ICD-10-CM | POA: Insufficient documentation

## 2014-03-23 DIAGNOSIS — I429 Cardiomyopathy, unspecified: Secondary | ICD-10-CM | POA: Insufficient documentation

## 2014-03-23 DIAGNOSIS — F1010 Alcohol abuse, uncomplicated: Secondary | ICD-10-CM | POA: Insufficient documentation

## 2014-03-23 DIAGNOSIS — Z95810 Presence of automatic (implantable) cardiac defibrillator: Secondary | ICD-10-CM | POA: Diagnosis not present

## 2014-03-23 DIAGNOSIS — I472 Ventricular tachycardia: Secondary | ICD-10-CM | POA: Insufficient documentation

## 2014-03-23 LAB — BASIC METABOLIC PANEL
Anion gap: 14 (ref 5–15)
BUN: 16 mg/dL (ref 6–23)
CHLORIDE: 104 meq/L (ref 96–112)
CO2: 25 mEq/L (ref 19–32)
Calcium: 9.1 mg/dL (ref 8.4–10.5)
Creatinine, Ser: 1.16 mg/dL (ref 0.50–1.35)
GFR calc Af Amer: 83 mL/min — ABNORMAL LOW (ref >90)
GFR, EST NON AFRICAN AMERICAN: 72 mL/min — AB (ref >90)
GLUCOSE: 98 mg/dL (ref 70–99)
POTASSIUM: 4 meq/L (ref 3.7–5.3)
Sodium: 143 mEq/L (ref 137–147)

## 2014-03-23 LAB — MAGNESIUM: MAGNESIUM: 2.1 mg/dL (ref 1.5–2.5)

## 2014-03-23 MED ORDER — HYDRALAZINE HCL 25 MG PO TABS: 25.0000 mg | ORAL_TABLET | Freq: Three times a day (TID) | ORAL | Status: DC

## 2014-03-23 NOTE — Patient Instructions (Signed)
Doing fantastic.  Increase your hydralazine to 25 mg (1 tablet) three times a day.  Call if you need anything.  Follow up in 6 weeks.  Do the following things EVERYDAY: 1) Weigh yourself in the morning before breakfast. Write it down and keep it in a log. 2) Take your medicines as prescribed 3) Eat low salt foods-Limit salt (sodium) to 2000 mg per day.  4) Stay as active as you can everyday 5) Limit all fluids for the day to less than 2 liters 6)

## 2014-03-23 NOTE — Progress Notes (Addendum)
Patient ID: Manus Ruddimothy L Agard, male   DOB: 06-30-1965, 48 y.o.   MRN: [id number]   ---Medtronic Optivol ----   Referring Physician: Dr. Jens Somrenshaw Primary Physician: Staci RighterOMER, ROBERT, MD  Primary Cardiologist: Dr Ladona Ridgelaylor    HPI: Manus Ruddimothy L Bekele is a 48 y.o. male with a history of HIV 1998, HCV, ETOH abuse and CHF due to NICM s/p Medtronic  CRT-D (06/2013). He also has a h/o syncope and previously had an EP study which was non-inducible for VT.    Was admitted 01/19/13- 01/22/13 for acute on chronic systolic HF. ECHO EF 20-25%. He was diuresed with IV lasix. He was discharged on coreg 6.25 mg BID, digoxin 0.25 mg daily, lisinopril 40 mg daily, and spironolactone 25 mg daily. Discharge weight 150 lbs. He stopped spironolactone due to severe headaches.  He was not able to tolerate titration of Coreg to 25 mg bid due to lightheadedness.   Follow up for Heart Failure: Last visit was seen for acute visit and was dry received 500 NS and held lasix for 2 days. Doing well. Denies SOB, orthopnea, PND or CP. No ICD shocks. Weight at home 154 lbs. Doing everything that he wants to do and has not issues. Very active. Following a low salt diet and drinking less than 2L a day.   ECHO 04/2013: EF 20-25%, mod/severe MR, LA mod dilated ECHO 10/16/13 EF 20-25%   Labs (12/14): digoxin 0.5 Labs (2/15): K 4.2, creatinine 0.88 Labs (08/2013): K 4.2 Creatinine 1.02  Optivol: Activy ~4 hours  Fluid below threshold. VT noted Device interrogated to by Medtronic.   SH: [place] Drinks 2 beers per day.   ROS: All systems negative except as listed in HPI, PMH and Problem List.  Past Medical History  Diagnosis Date  . Hypertension   . HIV infection   . Hepatitis     Hep B and Hep C (patient does not report this but these are listed in previous notes.)  . G6PD deficiency   . Chronic systolic heart failure     a. Echo 12/05/11:  EF 20-25%, diff HK with mild sparing of IL wall, mild AI, mod MR, mild  LAE, mild RVE, mild reduced RVF.;  b.  Echo 05/11/2012:  Mild LVH, EF 20-25%, Gr 1 diast dysfn, mod MR, mild LAE  . NICM (nonischemic cardiomyopathy)     cardia CTA 8/13 negative for obstructive CAD  . CHF (congestive heart failure)   . GERD (gastroesophageal reflux disease)   . Depression     Current Outpatient Prescriptions  Medication Sig Dispense Refill  . amiodarone (PACERONE) 400 MG tablet Take 1 tablet (400 mg total) by mouth daily.  30 tablet  3  . carvedilol (COREG) 25 MG tablet Take 1 tablet (25 mg total) by mouth 2 (two) times daily with a meal.  60 tablet  3  . colchicine 0.6 MG tablet Take 1 tablet (0.6 mg total) by mouth 2 (two) times daily.  60 tablet  0  . dolutegravir (TIVICAY) 50 MG tablet Take 1 tablet (50 mg total) by mouth daily with lunch.  30 tablet  0  . Emtricitab-Rilpivir-Tenofovir 200-25-300 MG TABS Take 1 tablet by mouth daily with lunch.  30 tablet  0  . furosemide (LASIX) 40 MG tablet Take 40 mg by mouth daily.      . hydrALAZINE (APRESOLINE) 25 MG tablet Take 12.5 mg by mouth 3 (three) times daily.      Marland Kitchen. lisinopril (PRINIVIL,ZESTRIL) 20  MG tablet Take 1 tablet (20 mg total) by mouth daily.  30 tablet  11  . ondansetron (ZOFRAN) 4 MG tablet Take 1 tablet (4 mg total) by mouth 2 (two) times daily.  60 tablet  3  . potassium chloride SA (K-DUR,KLOR-CON) 20 MEQ tablet Take 1 tablet (20 mEq total) by mouth 2 (two) times daily.  180 tablet  3  . spironolactone (ALDACTONE) 25 MG tablet Take 12.5 mg by mouth daily.      . valACYclovir (VALTREX) 1000 MG tablet Take 1 tablet (1,000 mg total) by mouth 2 (two) times daily as needed (outbreaks). Take for 5 days for an outbreak  10 tablet  0  . zolpidem (AMBIEN) 5 MG tablet Take 1 tablet (5 mg total) by mouth at bedtime as needed for sleep.  30 tablet  3   No current facility-administered medications for this encounter.    Filed Vitals:   03/23/14 1053  BP: 110/77  Pulse: 63  Weight: 161 lb 8 oz (73.256 kg)  SpO2:  97%    Physical Exam:  General: Well appearing, NAD.  HEENT: normal. + multiple earrings/studs  Neck: supple. JVP 7-8. Carotids 2+ bilat; no bruits. No lymphadenopathy or thryomegaly appreciated.  Cor: PMI laterally displaced. RRR, 2/6 MR  Lungs: CTA Abdomen: soft, nontender, distended. No hepatosplenomegaly. No bruits or masses. Good bowel sounds.  Extremities: no cyanosis, clubbing, rash, edema  Neuro: alert & oriented x 3, cranial nerves grossly intact. moves all 4 extremities w/o difficulty. Affect pleasant.  ASSESSMENT & PLAN:  1) Chronic systolic HF: Nonischemic cardiomyopathy, ?due to ETOH abuse. EF 25% (09/2013) s/p Medtronic CRT-D (06/2013).   - NYHA I-II symptoms and volume status slightly elevated. Will increase lasix to 40 mg BID for the next two days with an extra potassium and then go back to 40 mg daily. Optivol interrogated: Fluid trending up but below threshold and thoracic impedence slightly down. No afib. Patient activity ~6-7 hrs a day. - Continue coreg 25 mg BID, on goal dose. - Continue lisinopril 40 mg daily and spironolactone 25 mg daily. - Increase hydralazine back to 25 mg TID. - Unable to tolerate Imdur d/t headaches.  - Reinforced the need and importance of daily weights, a low sodium diet, and fluid restriction (less than 2 L a day). Instructed to call the HF clinic if weight increases more than 3 lbs overnight or 5 lbs in a week.  2) ETOH abuse:  Continues to abstain from ETOH completely.   3) HTN: stable. As above increase hydralazine to 25 mg TID.  4) Vtach: Followed by Dr. Ladona Ridgel. Continue amiodarone 400 mg daily. Check K+ and Magnesium today.   Follow up 6 weeks Aundria Rud NP-C  03/23/2014

## 2014-03-28 ENCOUNTER — Other Ambulatory Visit: Payer: Self-pay | Admitting: Licensed Clinical Social Worker

## 2014-03-28 MED ORDER — DOLUTEGRAVIR SODIUM 50 MG PO TABS: 50.0000 mg | ORAL_TABLET | Freq: Every day | ORAL | Status: DC

## 2014-03-28 MED ORDER — EMTRICITAB-RILPIVIR-TENOFOV DF 200-25-300 MG PO TABS: 1.0000 | ORAL_TABLET | Freq: Every day | ORAL | Status: DC

## 2014-04-12 ENCOUNTER — Encounter: Payer: Self-pay | Admitting: Internal Medicine

## 2014-04-13 ENCOUNTER — Telehealth: Payer: Self-pay | Admitting: *Deleted

## 2014-04-13 NOTE — Telephone Encounter (Signed)
Patient called c/o diarrhea twice since last night and abdominal cramping. He said his mouth is dry, but he is still urinating. Patient advised to drink plenty of water and try a heating pad on his abdomen. Told him if he continues to have diarrhea he should go to the Urgent Care to be examined. He agreed with this plan.

## 2014-04-20 ENCOUNTER — Encounter (HOSPITAL_COMMUNITY): Payer: Medicare Other

## 2014-04-25 ENCOUNTER — Encounter: Payer: Medicare Other | Admitting: Internal Medicine

## 2014-04-30 ENCOUNTER — Telehealth (HOSPITAL_COMMUNITY): Payer: Self-pay | Admitting: Vascular Surgery

## 2014-04-30 DIAGNOSIS — I5022 Chronic systolic (congestive) heart failure: Secondary | ICD-10-CM

## 2014-04-30 MED ORDER — SPIRONOLACTONE 25 MG PO TABS: 12.5000 mg | ORAL_TABLET | Freq: Every day | ORAL | Status: DC

## 2014-04-30 NOTE — Telephone Encounter (Signed)
As requested refills sent into pharmacy 

## 2014-04-30 NOTE — Telephone Encounter (Signed)
PT has no more refills for Spironolactone

## 2014-05-04 ENCOUNTER — Encounter (HOSPITAL_COMMUNITY): Payer: Self-pay

## 2014-05-04 ENCOUNTER — Ambulatory Visit (HOSPITAL_COMMUNITY)
Admission: RE | Admit: 2014-05-04 | Discharge: 2014-05-04 | Disposition: A | Discharge status: Home / Self Care | Payer: Medicare Other | Source: Ambulatory Visit | Attending: Internal Medicine | Admitting: Internal Medicine

## 2014-05-04 VITALS — BP 123/75 | HR 68 | Resp 18 | Wt 163.5 lb

## 2014-05-04 DIAGNOSIS — B1920 Unspecified viral hepatitis C without hepatic coma: Secondary | ICD-10-CM | POA: Insufficient documentation

## 2014-05-04 DIAGNOSIS — I5022 Chronic systolic (congestive) heart failure: Secondary | ICD-10-CM | POA: Insufficient documentation

## 2014-05-04 DIAGNOSIS — K219 Gastro-esophageal reflux disease without esophagitis: Secondary | ICD-10-CM | POA: Insufficient documentation

## 2014-05-04 DIAGNOSIS — I10 Essential (primary) hypertension: Secondary | ICD-10-CM | POA: Insufficient documentation

## 2014-05-04 DIAGNOSIS — F1010 Alcohol abuse, uncomplicated: Secondary | ICD-10-CM | POA: Insufficient documentation

## 2014-05-04 DIAGNOSIS — Z21 Asymptomatic human immunodeficiency virus [HIV] infection status: Secondary | ICD-10-CM | POA: Diagnosis not present

## 2014-05-04 DIAGNOSIS — Z79899 Other long term (current) drug therapy: Secondary | ICD-10-CM | POA: Insufficient documentation

## 2014-05-04 DIAGNOSIS — I472 Ventricular tachycardia, unspecified: Secondary | ICD-10-CM

## 2014-05-04 DIAGNOSIS — R51 Headache: Secondary | ICD-10-CM | POA: Diagnosis not present

## 2014-05-04 DIAGNOSIS — E7401 von Gierke disease: Secondary | ICD-10-CM | POA: Insufficient documentation

## 2014-05-04 DIAGNOSIS — F329 Major depressive disorder, single episode, unspecified: Secondary | ICD-10-CM | POA: Insufficient documentation

## 2014-05-04 DIAGNOSIS — I429 Cardiomyopathy, unspecified: Secondary | ICD-10-CM | POA: Insufficient documentation

## 2014-05-04 DIAGNOSIS — R519 Headache, unspecified: Secondary | ICD-10-CM

## 2014-05-04 DIAGNOSIS — B1910 Unspecified viral hepatitis B without hepatic coma: Secondary | ICD-10-CM | POA: Insufficient documentation

## 2014-05-04 LAB — BASIC METABOLIC PANEL
ANION GAP: 12 (ref 5–15)
BUN: 17 mg/dL (ref 6–23)
CALCIUM: 8.9 mg/dL (ref 8.4–10.5)
CO2: 27 meq/L (ref 19–32)
Chloride: 103 mEq/L (ref 96–112)
Creatinine, Ser: 1.17 mg/dL (ref 0.50–1.35)
GFR calc Af Amer: 82 mL/min — ABNORMAL LOW (ref >90)
GFR calc non Af Amer: 71 mL/min — ABNORMAL LOW (ref >90)
Glucose, Bld: 92 mg/dL (ref 70–99)
POTASSIUM: 4.2 meq/L (ref 3.7–5.3)
SODIUM: 142 meq/L (ref 137–147)

## 2014-05-04 MED ORDER — LISINOPRIL 20 MG PO TABS: 20.0000 mg | ORAL_TABLET | Freq: Two times a day (BID) | ORAL | Status: DC

## 2014-05-04 NOTE — Progress Notes (Signed)
Patient ID: TEGH BRYS, male   DOB: 1966-05-04, 48 y.o.   MRN: [id number]  ---[employer] Optivol ----   Referring Physician: Dr. Jens Som Primary Physician: [name], MD  Primary Cardiologist: Dr Ladona Ridgel    HPI: [name] is a 48 y.o. male with a history of HIV 1998, HCV, ETOH abuse and CHF due to NICM s/p Medtronic  CRT-D (06/2013). He also has a h/o syncope and previously had an EP study which was non-inducible for VT.    Was admitted 01/19/13- 01/22/13 for acute on chronic systolic HF. ECHO EF 20-25%. He was diuresed with IV lasix. He was discharged on coreg 6.25 mg BID, digoxin 0.25 mg daily, lisinopril 40 mg daily, and spironolactone 25 mg daily. Discharge weight 150 lbs. He stopped spironolactone due to severe headaches.  He was not able to tolerate titration of Coreg to 25 mg bid due to lightheadedness.   Follow up for Heart Failure: Last visit was seen for acute visit and was dry received 500 NS and held lasix for 2 days. Doing well. Denies SOB, orthopnea, PND or CP. No ICD shocks. Weight at home 154 lbs. Doing everything that he wants to do and has not issues. Very active. Following a low salt diet and drinking less than 2L a day.   ECHO 04/2013: EF 20-25%, mod/severe MR, LA mod dilated ECHO 10/16/13 EF 20-25%   Labs (12/14): digoxin 0.5 Labs (2/15): K 4.2, creatinine 0.88 Labs (08/2013): K 4.2 Creatinine 1.02 Labs (10/15): K 4.0, creatinine 1.16  Optivol: Activy ~4 hours  Fluid below threshold. VT noted Device interrogated to by Medtronic.   SH: Lives with partner in [place] Drinks 2 beers per day.   ROS: All systems negative except as listed in HPI, PMH and Problem List.  Past Medical History  Diagnosis Date  . Hypertension   . HIV infection   . Hepatitis     Hep B and Hep C (patient does not report this but these are listed in previous notes.)  . G6PD deficiency   . Chronic systolic heart failure     a. Echo 12/05/11:  EF 20-25%, diff HK with mild  sparing of IL wall, mild AI, mod MR, mild LAE, mild RVE, mild reduced RVF.;  b.  Echo 05/11/2012:  Mild LVH, EF 20-25%, Gr 1 diast dysfn, mod MR, mild LAE  . NICM (nonischemic cardiomyopathy)     cardia CTA 8/13 negative for obstructive CAD  . CHF (congestive heart failure)   . GERD (gastroesophageal reflux disease)   . Depression     Current Outpatient Prescriptions  Medication Sig Dispense Refill  . amiodarone (PACERONE) 400 MG tablet Take 1 tablet (400 mg total) by mouth daily. 30 tablet 3  . carvedilol (COREG) 25 MG tablet Take 1 tablet (25 mg total) by mouth 2 (two) times daily with a meal. 60 tablet 3  . dolutegravir (TIVICAY) 50 MG tablet Take 1 tablet (50 mg total) by mouth daily with lunch. 30 tablet 3  . Emtricitab-Rilpivir-Tenofovir 200-25-300 MG TABS Take 1 tablet by mouth daily with lunch. 30 tablet 3  . furosemide (LASIX) 40 MG tablet Take 40 mg by mouth daily.    . hydrALAZINE (APRESOLINE) 25 MG tablet Take 1 tablet (25 mg total) by mouth 3 (three) times daily. 90 tablet 3  . lisinopril (PRINIVIL,ZESTRIL) 20 MG tablet Take 1 tablet (20 mg total) by mouth daily. 30 tablet 11  . ondansetron (ZOFRAN) 4 MG tablet Take 1 tablet (4 mg  total) by mouth 2 (two) times daily. 60 tablet 3  . potassium chloride SA (K-DUR,KLOR-CON) 20 MEQ tablet Take 40 mEq by mouth daily.    Marland Kitchen spironolactone (ALDACTONE) 25 MG tablet Take 0.5 tablets (12.5 mg total) by mouth daily. 15 tablet 3  . valACYclovir (VALTREX) 1000 MG tablet Take 1 tablet (1,000 mg total) by mouth 2 (two) times daily as needed (outbreaks). Take for 5 days for an outbreak 10 tablet 0  . zolpidem (AMBIEN) 5 MG tablet Take 1 tablet (5 mg total) by mouth at bedtime as needed for sleep. 30 tablet 3   No current facility-administered medications for this encounter.    Filed Vitals:   05/04/14 0947  BP: 123/75  Pulse: 68  Resp: 18  Weight: 163 lb 8 oz (74.163 kg)  SpO2: 97%    Physical Exam:  General: Well appearing, NAD.   HEENT: normal. + multiple earrings/studs  Neck: supple. JVP 7-8. Carotids 2+ bilat; no bruits. No lymphadenopathy or thryomegaly appreciated.  Cor: PMI laterally displaced. RRR, 2/6 MR  Lungs: CTA Abdomen: soft, nontender, distended. No hepatosplenomegaly. No bruits or masses. Good bowel sounds.  Extremities: no cyanosis, clubbing, rash, edema  Neuro: alert & oriented x 3, cranial nerves grossly intact. moves all 4 extremities w/o difficulty. Affect pleasant.  ASSESSMENT & PLAN:  1) Chronic systolic HF: Nonischemic cardiomyopathy, ?due to ETOH abuse. EF 25% (09/2013) s/p Medtronic CRT-D (06/2013).   - NYHA I-II symptoms and volume status stable. Will continue lasix 40 mg daily and decrease potassium to 20 meq daily.  - Optivol interrogated: Fluid below threshold and thoracic impedence slightly down. No afib. Patient activity ~6-7 hrs a day. - Continue coreg 25 mg BID, on goal dose. - Will increase lisinopril to 20 mg BID and will stop hydralazine which he believes is causing his headaches. Will check BMET today and in 7-10 days.  - Unable to tolerate Imdur d/t headaches.  - Reinforced the need and importance of daily weights, a low sodium diet, and fluid restriction (less than 2 L a day). Instructed to call the HF clinic if weight increases more than 3 lbs overnight or 5 lbs in a week.  2) ETOH abuse:  Drinks wine cooler occasionally. Discussed the need to try to abstain completely or keep it very minimal less than 1 drink a day with his past history.  3) HTN: stable. As above increase lisinopril and stop hydralazine.  4) Vtach: Followed by Dr. Ladona Ridgel. Continue amiodarone 400 mg daily.   F/U 2 months with BMET in 7-10 days. Ulla Potash B NP-C  05/04/2014

## 2014-05-04 NOTE — Addendum Note (Signed)
Encounter addended by: Aundria Rud, NP on: 05/04/2014 11:16 AM<BR>     Documentation filed: Patient Instructions Section

## 2014-05-04 NOTE — Patient Instructions (Addendum)
Stop hydralazine and see if your headaches are better.  Increase your lisinopril to 20 mg (1 tablet) twice a day. Call any dizziness.  Come back in clinic to get blood work on Dec 15th anytime.  Decrease potassium to 20 meq daily.   Follow up in 2 months  Call any issues.  Do the following things EVERYDAY: 1) Weigh yourself in the morning before breakfast. Write it down and keep it in a log. 2) Take your medicines as prescribed 3) Eat low salt foods-Limit salt (sodium) to 2000 mg per day.  4) Stay as active as you can everyday 5) Limit all fluids for the day to less than 2 liters 6)

## 2014-05-08 ENCOUNTER — Ambulatory Visit (INDEPENDENT_AMBULATORY_CARE_PROVIDER_SITE_OTHER): Payer: Medicare Other | Admitting: Internal Medicine

## 2014-05-08 ENCOUNTER — Encounter: Payer: Self-pay | Admitting: Internal Medicine

## 2014-05-08 VITALS — BP 116/78 | HR 59 | Ht 64.5 in | Wt 166.0 lb

## 2014-05-08 DIAGNOSIS — I5022 Chronic systolic (congestive) heart failure: Secondary | ICD-10-CM | POA: Diagnosis not present

## 2014-05-08 DIAGNOSIS — I472 Ventricular tachycardia: Secondary | ICD-10-CM

## 2014-05-08 DIAGNOSIS — Z95810 Presence of automatic (implantable) cardiac defibrillator: Secondary | ICD-10-CM | POA: Diagnosis not present

## 2014-05-08 DIAGNOSIS — I4729 Other ventricular tachycardia: Secondary | ICD-10-CM

## 2014-05-08 DIAGNOSIS — I429 Cardiomyopathy, unspecified: Secondary | ICD-10-CM

## 2014-05-08 LAB — MDC_IDC_ENUM_SESS_TYPE_INCLINIC
Battery Voltage: 2.97 V
Brady Statistic AP VP Percent: 1.26 %
Brady Statistic AP VS Percent: 0.04 %
Brady Statistic AS VP Percent: 97.11 %
Brady Statistic AS VS Percent: 1.59 %
Brady Statistic RA Percent Paced: 1.3 %
Date Time Interrogation Session: 20151208161821
HIGH POWER IMPEDANCE MEASURED VALUE: 247 Ohm
HighPow Impedance: 66 Ohm
Lead Channel Impedance Value: 361 Ohm
Lead Channel Impedance Value: 456 Ohm
Lead Channel Impedance Value: 456 Ohm
Lead Channel Impedance Value: 551 Ohm
Lead Channel Impedance Value: 836 Ohm
Lead Channel Pacing Threshold Amplitude: 0.75 V
Lead Channel Pacing Threshold Amplitude: 0.75 V
Lead Channel Pacing Threshold Amplitude: 0.875 V
Lead Channel Pacing Threshold Pulse Width: 0.4 ms
Lead Channel Pacing Threshold Pulse Width: 0.4 ms
Lead Channel Sensing Intrinsic Amplitude: 1.875 mV
Lead Channel Sensing Intrinsic Amplitude: 19 mV
Lead Channel Sensing Intrinsic Amplitude: 2 mV
Lead Channel Sensing Intrinsic Amplitude: 22.25 mV
Lead Channel Setting Pacing Amplitude: 2 V
Lead Channel Setting Pacing Amplitude: 2.5 V
Lead Channel Setting Pacing Pulse Width: 0.4 ms
Lead Channel Setting Sensing Sensitivity: 0.3 mV
MDC IDC MSMT BATTERY REMAINING LONGEVITY: 86 mo
MDC IDC MSMT LEADCHNL RV PACING THRESHOLD PULSEWIDTH: 0.4 ms
MDC IDC SET LEADCHNL LV PACING AMPLITUDE: 1.75 V
MDC IDC SET LEADCHNL RV PACING PULSEWIDTH: 0.4 ms
MDC IDC SET ZONE DETECTION INTERVAL: 350 ms
MDC IDC STAT BRADY RV PERCENT PACED: 32.22 %
Zone Setting Detection Interval: 300 ms
Zone Setting Detection Interval: 350 ms
Zone Setting Detection Interval: 360 ms

## 2014-05-08 MED ORDER — AMIODARONE HCL 200 MG PO TABS: ORAL_TABLET | ORAL | Status: DC

## 2014-05-08 NOTE — Assessment & Plan Note (Signed)
His Medtronic biventricular ICD is working normally. We'll plan to recheck in several months. 

## 2014-05-08 NOTE — Patient Instructions (Signed)
Your physician wants you to follow-up in: 3 months with Dr. Ladona Ridgel   Your physician has recommended you make the following change in your medication:  1) Finish current bottle of Amiodarone that you have now 2) Start Amiodarone 200mg  ---- take 1 1/2 tablets M-F and take 2 tablets on Sat/Sun

## 2014-05-08 NOTE — Assessment & Plan Note (Signed)
He has a history of chronic alcohol abuse. He has stop drinking, except for an occasional glass of wine, but never more than one glass per day. I've discussed the importance of limiting alcohol consumption. I strongly suspect that his worsening heart failure and ventricular tachycardia was due to alcohol abuse.

## 2014-05-08 NOTE — Assessment & Plan Note (Signed)
His chronic systolic heart failure continues to improve, and he is now class IIa. He will continue his current medical therapy, and is encouraged to maintain a low-sodium diet, and from using alcohol.

## 2014-05-08 NOTE — Progress Notes (Signed)
HPI [name] returns today for followup. He was hospitalized several months ago with VT storm and received multiple ICD shocks. No CAD at cath. His EF has gradually gone down. He was placed on amiodarone. In the interim, he has stopped smoking, THC and stopped drinking. He has had no ICD shocks. He has had no chest pain or shortness of breath. No syncope. No peripheral edema.   Allergies  Allergen Reactions  . Bactrim Rash     Current Outpatient Prescriptions  Medication Sig Dispense Refill  . amiodarone (PACERONE) 400 MG tablet Take 1 tablet (400 mg total) by mouth daily. 30 tablet 3  . carvedilol (COREG) 25 MG tablet Take 1 tablet (25 mg total) by mouth 2 (two) times daily with a meal. 60 tablet 3  . dolutegravir (TIVICAY) 50 MG tablet Take 1 tablet (50 mg total) by mouth daily with lunch. 30 tablet 3  . Emtricitab-Rilpivir-Tenofovir 200-25-300 MG TABS Take 1 tablet by mouth daily with lunch. 30 tablet 3  . furosemide (LASIX) 40 MG tablet Take 40 mg by mouth 2 (two) times daily.     Marland Kitchen lisinopril (PRINIVIL,ZESTRIL) 20 MG tablet Take 1 tablet (20 mg total) by mouth 2 (two) times daily. 60 tablet 6  . potassium chloride SA (K-DUR,KLOR-CON) 20 MEQ tablet Take 20 mEq by mouth daily.    Marland Kitchen spironolactone (ALDACTONE) 25 MG tablet Take 0.5 tablets (12.5 mg total) by mouth daily. 15 tablet 3  . valACYclovir (VALTREX) 1000 MG tablet Take 1 tablet (1,000 mg total) by mouth 2 (two) times daily as needed (outbreaks). Take for 5 days for an outbreak 10 tablet 0  . zolpidem (AMBIEN) 5 MG tablet Take 1 tablet (5 mg total) by mouth at bedtime as needed for sleep. 30 tablet 3   No current facility-administered medications for this visit.     Past Medical History  Diagnosis Date  . Hypertension   . HIV infection   . Hepatitis     Hep B and Hep C (patient does not report this but these are listed in previous notes.)  . G6PD deficiency   . Chronic systolic heart failure     a. Echo 12/05/11:   EF 20-25%, diff HK with mild sparing of IL wall, mild AI, mod MR, mild LAE, mild RVE, mild reduced RVF.;  b.  Echo 05/11/2012:  Mild LVH, EF 20-25%, Gr 1 diast dysfn, mod MR, mild LAE  . NICM (nonischemic cardiomyopathy)     cardia CTA 8/13 negative for obstructive CAD  . CHF (congestive heart failure)   . GERD (gastroesophageal reflux disease)   . Depression     ROS:   All systems reviewed and negative except as noted in the HPI.   Past Surgical History  Procedure Laterality Date  . Finger surgery      Thumb laceration.    . Esophagogastroduodenoscopy  12/07/2011    Procedure: ESOPHAGOGASTRODUODENOSCOPY (EGD);  Surgeon: [name], MD,FACG;  Location: [facility] ENDOSCOPY;  Service: Endoscopy;  Laterality: N/A;     Family History  Problem Relation Age of Onset  . Cancer Mother      History   Social History  . Marital Status: Single    Spouse Name: N/A    Number of Children: N/A  . Years of Education: N/A   Occupational History  . Unemployed    Social History Main Topics  . Smoking status: Never Smoker   . Smokeless tobacco: Never Used  . Alcohol  Use: No     Comment: cutting back   . Drug Use: No     Comment: marijuana daily  . Sexual Activity:    Partners: Male     Comment: 28 year relationship, declined condoms   Other Topics Concern  . Not on file   Social History Narrative   Lives alone.  Drinks beer daily.  Liquor rarely.  He occasionally smokes marijuana..     BP 116/78 mmHg  Pulse 59  Ht 5' 4.5" (1.638 m)  Wt 166 lb (75.297 kg)  BMI 28.06 kg/m2  Physical Exam:  Well appearing middle aged man, NAD HEENT: Unremarkable Neck:  No JVD, no thyromegally Back:  No CVA tenderness Lungs:  Clear with no wheezes HEART:  Regular rate rhythm, no murmurs, no rubs, no clicks Abd:  soft, positive bowel sounds, no organomegally, no rebound, no guarding Ext:  2 plus pulses, no edema, no cyanosis, no clubbing Skin:  No rashes no nodules Neuro:  CN II through  XII intact, motor grossly intact   DEVICE  Normal device function.  See PaceArt for details.   Assess/Plan:

## 2014-05-08 NOTE — Assessment & Plan Note (Signed)
His ventricular tachycardia has been well-controlled on amiodarone. Today we will reduce his dose from 400 mg daily down to 300 mg Monday through Friday and 400 mg Saturday and Sunday. I will see the patient back in 3 months, and we will hope to reduce his dose further, based on the results of ICD interrogation.

## 2014-05-10 ENCOUNTER — Encounter (HOSPITAL_COMMUNITY): Payer: Self-pay | Admitting: Internal Medicine

## 2014-05-11 ENCOUNTER — Telehealth (HOSPITAL_COMMUNITY): Payer: Self-pay | Admitting: *Deleted

## 2014-05-11 MED ORDER — LISINOPRIL 20 MG PO TABS: 20.0000 mg | ORAL_TABLET | Freq: Every day | ORAL | Status: DC

## 2014-05-11 NOTE — Telephone Encounter (Signed)
RN with [facility] is out seeing pt today and called to report his BP is low and he feels bad, she states pt is weak and tired and reports some nausea, BP in left arm 86/58, right arm 92/60, his BP usually runs 116-120s, at his OV 12/4 Lisinopril was increased from 20 mg daily to 20 mg Twice daily and he reports since increasing med he has been feeling bad.  His wt is stable and no c/o SOB or edema, he will decrease Lisinopril back to 20 mg daily and drink some extra fluids today, he has already taken morning meds today, if not feeling better he will call us back, will send to Ulla Potash, NP for review as she saw pt and changed meds 12/4

## 2014-05-15 ENCOUNTER — Other Ambulatory Visit (HOSPITAL_COMMUNITY): Payer: Medicare Other

## 2014-05-22 ENCOUNTER — Ambulatory Visit (HOSPITAL_COMMUNITY)
Admission: RE | Admit: 2014-05-22 | Discharge: 2014-05-22 | Disposition: A | Discharge status: Home / Self Care | Payer: Medicare Other | Source: Ambulatory Visit | Attending: Internal Medicine | Admitting: Internal Medicine

## 2014-05-22 DIAGNOSIS — I5022 Chronic systolic (congestive) heart failure: Secondary | ICD-10-CM | POA: Diagnosis not present

## 2014-05-22 LAB — BASIC METABOLIC PANEL
Anion gap: 8 (ref 5–15)
BUN: 21 mg/dL (ref 6–23)
CHLORIDE: 105 meq/L (ref 96–112)
CO2: 27 mmol/L (ref 19–32)
Calcium: 9.3 mg/dL (ref 8.4–10.5)
Creatinine, Ser: 1.52 mg/dL — ABNORMAL HIGH (ref 0.50–1.35)
GFR calc non Af Amer: 52 mL/min — ABNORMAL LOW (ref >90)
GFR, EST AFRICAN AMERICAN: 60 mL/min — AB (ref >90)
Glucose, Bld: 93 mg/dL (ref 70–99)
POTASSIUM: 3.5 mmol/L (ref 3.5–5.1)
Sodium: 140 mmol/L (ref 135–145)

## 2014-05-30 ENCOUNTER — Telehealth (HOSPITAL_COMMUNITY): Payer: Self-pay

## 2014-05-30 NOTE — Telephone Encounter (Signed)
Per Ulla Potash NP-C, instructed patient o hold lasix x 2 days for increased BMET values on 12/22.  Aware and agreeable.

## 2014-05-31 ENCOUNTER — Encounter: Payer: Self-pay | Admitting: Internal Medicine

## 2014-06-19 ENCOUNTER — Other Ambulatory Visit: Payer: Medicare Other

## 2014-06-19 DIAGNOSIS — Z113 Encounter for screening for infections with a predominantly sexual mode of transmission: Secondary | ICD-10-CM | POA: Diagnosis not present

## 2014-06-19 DIAGNOSIS — B20 Human immunodeficiency virus [HIV] disease: Secondary | ICD-10-CM | POA: Diagnosis not present

## 2014-06-19 DIAGNOSIS — Z79899 Other long term (current) drug therapy: Secondary | ICD-10-CM

## 2014-06-19 LAB — CBC WITH DIFFERENTIAL/PLATELET
Basophils Absolute: 0 10*3/uL (ref 0.0–0.1)
Basophils Relative: 0 % (ref 0–1)
EOS PCT: 2 % (ref 0–5)
Eosinophils Absolute: 0.1 10*3/uL (ref 0.0–0.7)
HEMATOCRIT: 41.6 % (ref 39.0–52.0)
HEMOGLOBIN: 13.9 g/dL (ref 13.0–17.0)
LYMPHS ABS: 2.1 10*3/uL (ref 0.7–4.0)
Lymphocytes Relative: 29 % (ref 12–46)
MCH: 28.4 pg (ref 26.0–34.0)
MCHC: 33.4 g/dL (ref 30.0–36.0)
MCV: 85.1 fL (ref 78.0–100.0)
MONO ABS: 0.4 10*3/uL (ref 0.1–1.0)
MPV: 10.7 fL (ref 8.6–12.4)
Monocytes Relative: 6 % (ref 3–12)
NEUTROS ABS: 4.5 10*3/uL (ref 1.7–7.7)
Neutrophils Relative %: 63 % (ref 43–77)
Platelets: 252 10*3/uL (ref 150–400)
RBC: 4.89 MIL/uL (ref 4.22–5.81)
RDW: 14.8 % (ref 11.5–15.5)
WBC: 7.1 10*3/uL (ref 4.0–10.5)

## 2014-06-19 LAB — COMPLETE METABOLIC PANEL WITH GFR
ALT: 72 U/L — ABNORMAL HIGH (ref 0–53)
AST: 54 U/L — ABNORMAL HIGH (ref 0–37)
Albumin: 4.3 g/dL (ref 3.5–5.2)
Alkaline Phosphatase: 98 U/L (ref 39–117)
BUN: 14 mg/dL (ref 6–23)
CHLORIDE: 105 meq/L (ref 96–112)
CO2: 26 meq/L (ref 19–32)
Calcium: 9.2 mg/dL (ref 8.4–10.5)
Creat: 1.28 mg/dL (ref 0.50–1.35)
GFR, EST AFRICAN AMERICAN: 75 mL/min
GFR, Est Non African American: 65 mL/min
Glucose, Bld: 87 mg/dL (ref 70–99)
Potassium: 4.1 mEq/L (ref 3.5–5.3)
Sodium: 141 mEq/L (ref 135–145)
Total Bilirubin: 0.3 mg/dL (ref 0.2–1.2)
Total Protein: 7.3 g/dL (ref 6.0–8.3)

## 2014-06-19 LAB — RPR

## 2014-06-20 LAB — T-HELPER CELL (CD4) - (RCID CLINIC ONLY)
CD4 % Helper T Cell: 22 % — ABNORMAL LOW (ref 33–55)
CD4 T Cell Abs: 410 /uL (ref 400–2700)

## 2014-06-20 LAB — HIV-1 RNA QUANT-NO REFLEX-BLD
HIV 1 RNA Quant: <20 copies/mL (ref <20)
HIV-1 RNA Quant, Log: <1.3 {Log} (ref <1.30)

## 2014-06-21 ENCOUNTER — Telehealth (HOSPITAL_COMMUNITY): Payer: Self-pay | Admitting: Vascular Surgery

## 2014-06-21 MED ORDER — CARVEDILOL 25 MG PO TABS
25.0000 mg | ORAL_TABLET | Freq: Two times a day (BID) | ORAL | Status: DC
Start: 2014-06-21 — End: 2014-12-04

## 2014-06-21 NOTE — Telephone Encounter (Signed)
Refill Carvadilol and his nausea medication he does not know the name he says it starts with an O .. I didn't see that on medication list.. Please call pt he is very upset that it wasnt done yesterday.Marland Kitchen He called the pharmacy and they told him that the office was contacted and nothing was done.. Please Advise

## 2014-06-21 NOTE — Telephone Encounter (Signed)
Pt aware refill sent in for carvedilol

## 2014-06-26 ENCOUNTER — Ambulatory Visit (INDEPENDENT_AMBULATORY_CARE_PROVIDER_SITE_OTHER): Payer: Medicare Other | Admitting: Internal Medicine

## 2014-06-26 ENCOUNTER — Encounter: Payer: Self-pay | Admitting: Internal Medicine

## 2014-06-26 ENCOUNTER — Telehealth (HOSPITAL_COMMUNITY): Payer: Self-pay | Admitting: Vascular Surgery

## 2014-06-26 VITALS — BP 108/71 | HR 61 | Temp 98.0°F | Wt 174.0 lb

## 2014-06-26 DIAGNOSIS — G4700 Insomnia, unspecified: Secondary | ICD-10-CM | POA: Insufficient documentation

## 2014-06-26 DIAGNOSIS — F1010 Alcohol abuse, uncomplicated: Secondary | ICD-10-CM

## 2014-06-26 DIAGNOSIS — B20 Human immunodeficiency virus [HIV] disease: Secondary | ICD-10-CM

## 2014-06-26 MED ORDER — TRAZODONE HCL 100 MG PO TABS: 100.0000 mg | ORAL_TABLET | Freq: Every evening | ORAL | Status: DC | PRN

## 2014-06-26 NOTE — Telephone Encounter (Signed)
Patient states weight is up to 176lb per Dr's appointment today with Dr. Luciana Axe and that baseline is 155lb.  However, looking at his chart, he has not been 155lb since September, and has steadily increased overtime (160's in December).  Possibly real weight vs fluid weight.  Patient denies SOB but has distended belly.  Note from Dr. Luciana Axe mentions patient drinking wine coolers, and patient admits to drinking sodas.  Patient added on for Thursday morning and instructed to take 1 extra lasix tablet a day until seen.  Aware and agreeable.  Ave Filter

## 2014-06-26 NOTE — Progress Notes (Signed)
   Subjective:    Patient ID: [name], male    DOB: 10/03/1965, 49 y.o.   MRN: [id number]  HPI Here for follow up of his HIV.  He has been well controlled and last CD4 410 with undetectable vl.  No issues with HIV.   For his advanced CM, after his hospitalization for V. tach, he has not had any further firings of his ICD. He has now been alcohol free except occasional wine cooler, marijuana free.  He has noted some weight gain and is going to call his cardiologist. He is having trouble sleeping still and the Ambien did not help. He is also having problems with acid reflux.  Was prescribed linaclotide but has never taken.   Review of Systems  Constitutional: Negative for fever and fatigue.  HENT: Negative for trouble swallowing.   Respiratory: Negative for cough and shortness of breath.   Cardiovascular: Negative for leg swelling.  Gastrointestinal: Negative for nausea and abdominal pain.  Skin: Negative for rash.  Neurological: Negative for dizziness, light-headedness and headaches.       Objective:   Physical Exam  Constitutional: He appears well-developed and well-nourished. No distress.  HENT:  Mouth/Throat: No oropharyngeal exudate.  Eyes: No scleral icterus.  Cardiovascular: Normal rate, regular rhythm and normal heart sounds.   No murmur heard. Pulmonary/Chest: Effort normal and breath sounds normal. No respiratory distress. He has no wheezes.  Lymphadenopathy:    He has no cervical adenopathy.  Skin: No rash noted.          Assessment & Plan:

## 2014-06-26 NOTE — Assessment & Plan Note (Signed)
I will try trazodone for sleep since his Ambien has not helped.

## 2014-06-26 NOTE — Assessment & Plan Note (Signed)
He is doing well with his HIV. He will continue with his current regimen. He is told he is unable to take acid reflux medicine with rilpivirine

## 2014-06-26 NOTE — Assessment & Plan Note (Signed)
I reminded him to continue to abstain.

## 2014-06-26 NOTE — Telephone Encounter (Signed)
Pt called he is over weight.. Goal weight 155, weight 176 today, pt is having SOB, belly is full of fluid. Please advise

## 2014-06-28 ENCOUNTER — Ambulatory Visit (HOSPITAL_COMMUNITY)
Admission: RE | Admit: 2014-06-28 | Discharge: 2014-06-28 | Disposition: A | Discharge status: Home / Self Care | Payer: Medicare Other | Source: Ambulatory Visit | Attending: Cardiology | Admitting: Cardiology

## 2014-06-28 ENCOUNTER — Encounter (HOSPITAL_COMMUNITY): Payer: Self-pay

## 2014-06-28 VITALS — BP 110/60 | HR 63 | Wt 174.1 lb

## 2014-06-28 DIAGNOSIS — F1010 Alcohol abuse, uncomplicated: Secondary | ICD-10-CM | POA: Diagnosis not present

## 2014-06-28 DIAGNOSIS — I472 Ventricular tachycardia, unspecified: Secondary | ICD-10-CM

## 2014-06-28 DIAGNOSIS — I5022 Chronic systolic (congestive) heart failure: Secondary | ICD-10-CM

## 2014-06-28 MED ORDER — SACUBITRIL-VALSARTAN 24-26 MG PO TABS: 1.0000 | ORAL_TABLET | Freq: Two times a day (BID) | ORAL | Status: DC

## 2014-06-28 NOTE — Patient Instructions (Signed)
STOP Lisinopril STOP Hydralazine  START Entresto 24/26 mg twice a day  Labs needed in one week (BMET, TSH)  Your physician recommends that you schedule a follow-up appointment in: 1 month  Do the following things EVERYDAY: 1) Weigh yourself in the morning before breakfast. Write it down and keep it in a log. 2) Take your medicines as prescribed 3) Eat low salt foods-Limit salt (sodium) to 2000 mg per day.  4) Stay as active as you can everyday 5) Limit all fluids for the day to less than 2 liters 6)

## 2014-06-28 NOTE — Progress Notes (Signed)
Patient ID: [name], male   DOB: 03-29-1966, 49 y.o.   MRN: [id number]  ---[employer]onic Optivol ----   Primary Physician: [name], MD  Primary Cardiologist: Dr Ladona Ridgel    HPI: EVEN POLIO is a 49 y.o. male with a history of HIV 1998, HCV, ETOH abuse and CHF due to NICM s/p [employer]tronic  CRT-D (06/2013). He also has a h/o syncope and previously had an EP study which was non-inducible for VT.    Was admitted 01/19/13- 01/22/13 for acute on chronic systolic HF. ECHO EF 20-25%. He was diuresed with IV lasix. He was discharged on coreg 6.25 mg BID, digoxin 0.25 mg daily, lisinopril 40 mg daily, and spironolactone 25 mg daily. Discharge weight 150 lbs. He stopped spironolactone due to severe headaches.  He was not able to tolerate titration of Coreg to 25 mg bid due to lightheadedness.   Follow up for Heart Failure: Weight is up by about 9 lbs today.  He says his abdomen is larger.  At last appointment, lisinopril was increased but he did not tolerate it due to hypotension/lightheadedness. He is short of breath bending over but otherwise not short of breath walking on flat ground or up a short flight of steps.  He stays active.  He is drinking a wine cooler about twice a week.  He has been taking Lasix 40 mg bid.   ECHO 04/2013: EF 20-25%, mod/severe MR, LA mod dilated ECHO 10/16/13 EF 20-25%   Labs (12/14): digoxin 0.5 Labs (2/15): K 4.2, creatinine 0.88 Labs (08/2013): K 4.2 Creatinine 1.02 Labs (10/15): K 4.0, creatinine 1.16 Labs (1/16): K 4.1, creatinine 1.28, AST 54, ALT 72  Optivol: Activy ~4 hours  Fluid below threshold with impedance trending up.    SH: Lives with partner in [place], drinking a wine cooler twice a week (prior heavy ETOH).   ROS: All systems negative except as listed in HPI, PMH and Problem List.  Past Medical History  Diagnosis Date  . Hypertension   . HIV infection   . Hepatitis     Hep B and Hep C (patient does not report this but these are listed in  previous notes.)  . G6PD deficiency   . Chronic systolic heart failure     a. Echo 12/05/11:  EF 20-25%, diff HK with mild sparing of IL wall, mild AI, mod MR, mild LAE, mild RVE, mild reduced RVF.;  b.  Echo 05/11/2012:  Mild LVH, EF 20-25%, Gr 1 diast dysfn, mod MR, mild LAE  . NICM (nonischemic cardiomyopathy)     cardia CTA 8/13 negative for obstructive CAD  . CHF (congestive heart failure)   . GERD (gastroesophageal reflux disease)   . Depression     Current Outpatient Prescriptions  Medication Sig Dispense Refill  . amiodarone (PACERONE) 200 MG tablet Take 1 1/2 tablets by mouth Mon- Fri and take 2 tablets by mouth Sat and Sun 160 tablet 3  . carvedilol (COREG) 25 MG tablet Take 1 tablet (25 mg total) by mouth 2 (two) times daily with a meal. 60 tablet 3  . colchicine 0.6 MG tablet Take 0.6 mg by mouth 2 (two) times daily.    . dolutegravir (TIVICAY) 50 MG tablet Take 1 tablet (50 mg total) by mouth daily with lunch. 30 tablet 3  . Emtricitab-Rilpivir-Tenofovir 200-25-300 MG TABS Take 1 tablet by mouth daily with lunch. 30 tablet 3  . furosemide (LASIX) 40 MG tablet Take 40 mg by mouth 2 (two) times daily.     Marland Kitchen  potassium chloride SA (K-DUR,KLOR-CON) 20 MEQ tablet Take 20 mEq by mouth daily.    Marland Kitchen spironolactone (ALDACTONE) 25 MG tablet Take 0.5 tablets (12.5 mg total) by mouth daily. 15 tablet 3  . traZODone (DESYREL) 100 MG tablet Take 1 tablet (100 mg total) by mouth at bedtime as needed for sleep. 30 tablet 5  . valACYclovir (VALTREX) 1000 MG tablet Take 1 tablet (1,000 mg total) by mouth 2 (two) times daily as needed (outbreaks). Take for 5 days for an outbreak 10 tablet 0  . sacubitril-valsartan (ENTRESTO) 24-26 MG Take 1 tablet by mouth 2 (two) times daily. 60 tablet 3   No current facility-administered medications for this encounter.    Filed Vitals:   06/28/14 0908  BP: 110/60  Pulse: 63  Weight: 174 lb 1.9 oz (78.98 kg)  SpO2: 94%    Physical Exam:  General: Well  appearing, NAD.  HEENT: normal.  Neck: supple. No JVD. Carotids 2+ bilat; no bruits. No lymphadenopathy or thryomegaly appreciated.  Cor: PMI laterally displaced. RRR, 2/6 MR  Lungs: CTA Abdomen: soft, nontender, mildly distended. No hepatosplenomegaly. No bruits or masses. Good bowel sounds.  Extremities: no cyanosis, clubbing, rash, edema  Neuro: alert & oriented x 3, cranial nerves grossly intact. moves all 4 extremities w/o difficulty. Affect pleasant.  ASSESSMENT & PLAN:  1) Chronic systolic HF: Nonischemic cardiomyopathy, ?due to ETOH abuse. EF 25% (09/2013) s/p Medtronic CRT-D (06/2013).  NYHA I-II symptoms and volume status stable by exam and by Optivol.  Patient has had a weight gain, but I do not think it is fluid. - Will continue lasix 40 mg bid. - He cannot tolerate Imdur due to headache, so will stop hydralazine.  - I will stop lisinopril and replace it with Entresto 24/26 bid to start 36 hours after stopping lisinopril.  Will titrate up as tolerated.   BMET in 1 week.  - Continue coreg 25 mg BID, on goal dose. - Continue current spironolactone.  - Reinforced the need and importance of daily weights, a low sodium diet, and fluid restriction (less than 2 L a day). Instructed to call the HF clinic if weight increases more than 3 lbs overnight or 5 lbs in a week.  2) ETOH abuse:  Drinks wine cooler twice a week. Discussed the need to try to abstain completely or keep it very minimal less than 1 drink a day with his past history.  3) Ventricular tachycardia: Followed by Dr. Ladona Ridgel. Continue amiodarone.  Recent LFTs mildly elevated, will need to follow closely.  Will check TSH with next labs.    Followup in 1 month  Marca Ancona 06/28/2014 11:33 AM

## 2014-07-04 ENCOUNTER — Ambulatory Visit (HOSPITAL_COMMUNITY)
Admission: RE | Admit: 2014-07-04 | Discharge: 2014-07-04 | Disposition: A | Discharge status: Home / Self Care | Payer: Medicare Other | Source: Ambulatory Visit | Attending: Internal Medicine | Admitting: Internal Medicine

## 2014-07-04 DIAGNOSIS — I5022 Chronic systolic (congestive) heart failure: Secondary | ICD-10-CM | POA: Insufficient documentation

## 2014-07-04 LAB — BASIC METABOLIC PANEL
Anion gap: 6 (ref 5–15)
BUN: 10 mg/dL (ref 6–23)
CHLORIDE: 105 mmol/L (ref 96–112)
CO2: 29 mmol/L (ref 19–32)
Calcium: 8.8 mg/dL (ref 8.4–10.5)
Creatinine, Ser: 1.18 mg/dL (ref 0.50–1.35)
GFR calc Af Amer: 81 mL/min — ABNORMAL LOW (ref >90)
GFR calc non Af Amer: 70 mL/min — ABNORMAL LOW (ref >90)
Glucose, Bld: 134 mg/dL — ABNORMAL HIGH (ref 70–99)
Potassium: 4.1 mmol/L (ref 3.5–5.1)
SODIUM: 140 mmol/L (ref 135–145)

## 2014-07-04 LAB — TSH: TSH: 1.088 u[IU]/mL (ref 0.350–4.500)

## 2014-07-05 ENCOUNTER — Telehealth (HOSPITAL_COMMUNITY): Payer: Self-pay | Admitting: *Deleted

## 2014-07-05 NOTE — Telephone Encounter (Signed)
Completed PA for pt's [name], med was approved 06/01/14-06/01/15, pt is aware and states he picked med up with a $0 copay

## 2014-07-10 ENCOUNTER — Encounter: Payer: Self-pay | Admitting: Internal Medicine

## 2014-07-18 DIAGNOSIS — I10 Essential (primary) hypertension: Secondary | ICD-10-CM | POA: Diagnosis not present

## 2014-07-18 DIAGNOSIS — B20 Human immunodeficiency virus [HIV] disease: Secondary | ICD-10-CM | POA: Diagnosis not present

## 2014-07-18 DIAGNOSIS — Z1211 Encounter for screening for malignant neoplasm of colon: Secondary | ICD-10-CM | POA: Diagnosis not present

## 2014-07-30 ENCOUNTER — Ambulatory Visit (HOSPITAL_COMMUNITY)
Admission: RE | Admit: 2014-07-30 | Discharge: 2014-07-30 | Disposition: A | Discharge status: Home / Self Care | Payer: Medicare Other | Source: Ambulatory Visit | Attending: Internal Medicine | Admitting: Internal Medicine

## 2014-07-30 ENCOUNTER — Encounter (HOSPITAL_COMMUNITY): Payer: Self-pay

## 2014-07-30 VITALS — BP 114/72 | HR 61 | Wt 175.5 lb

## 2014-07-30 DIAGNOSIS — I5022 Chronic systolic (congestive) heart failure: Secondary | ICD-10-CM | POA: Insufficient documentation

## 2014-07-30 DIAGNOSIS — I472 Ventricular tachycardia: Secondary | ICD-10-CM | POA: Insufficient documentation

## 2014-07-30 DIAGNOSIS — I4729 Other ventricular tachycardia: Secondary | ICD-10-CM

## 2014-07-30 DIAGNOSIS — F1010 Alcohol abuse, uncomplicated: Secondary | ICD-10-CM | POA: Diagnosis not present

## 2014-07-30 NOTE — Patient Instructions (Signed)
Your physician recommends that you schedule a follow-up appointment in: 6 weeks  

## 2014-07-30 NOTE — Progress Notes (Signed)
Patient ID: [name], male   DOB: [birth year], 49 y.o.   MRN: [id number]  ---Medtronic Optivol ----   Primary Physician: [name], MD  Primary Cardiologist: Dr Ladona Ridgel    HPI: [name] is a 49 y.o. male with a history of HIV 1998, HCV, ETOH abuse and CHF due to NICM s/p Medtronic  CRT-D (06/2013). He also has a h/o syncope and previously had an EP study which was non-inducible for VT.    Was admitted 01/19/13- 01/22/13 for acute on chronic systolic HF. ECHO EF 20-25%. He was diuresed with IV lasix. He was discharged on coreg 6.25 mg BID, digoxin 0.25 mg daily, lisinopril 40 mg daily, and spironolactone 25 mg daily. Discharge weight 150 lbs. He stopped spironolactone due to severe headaches.  He was not able to tolerate titration of Coreg to 25 mg bid due to lightheadedness.   Follow up for Heart Failure: Overall feeling pretty good. Last visit lisinopril was stopped and entresto was added. Mild dizziness after he takes his medications. Weight at home 174 pounds. .He says his abdomen is larger.  Denies SOB/PND/Orthopnea. No shocks.  He is drinking a wine cooler about 3 days a week. Eating some high fat foods such as sausage and bacon.   ECHO 04/2013: EF 20-25%, mod/severe MR, LA mod dilated ECHO 10/16/13 EF 20-25%   Labs (12/14): digoxin 0.5 Labs (2/15): K 4.2, creatinine 0.88 Labs (08/2013): K 4.2 Creatinine 1.02 Labs (10/15): K 4.0, creatinine 1.16 Labs (1/16): K 4.1, creatinine 1.28, AST 54, ALT 72 Labs (07/04/2014) : K 4.1 Creatinine 1.18   Optivol: Activy ~6 hours  Fluid below threshold with impedance trending up.    SH: Lives with partner in [place], drinking a wine cooler twice a week (prior heavy ETOH).   ROS: All systems negative except as listed in HPI, PMH and Problem List.  Past Medical History  Diagnosis Date  . Hypertension   . HIV infection   . Hepatitis     Hep B and Hep C (patient does not report this but these are listed in previous notes.)  . G6PD  deficiency   . Chronic systolic heart failure     a. Echo 12/05/11:  EF 20-25%, diff HK with mild sparing of IL wall, mild AI, mod MR, mild LAE, mild RVE, mild reduced RVF.;  b.  Echo 05/11/2012:  Mild LVH, EF 20-25%, Gr 1 diast dysfn, mod MR, mild LAE  . NICM (nonischemic cardiomyopathy)     cardia CTA 8/13 negative for obstructive CAD  . CHF (congestive heart failure)   . GERD (gastroesophageal reflux disease)   . Depression     Current Outpatient Prescriptions  Medication Sig Dispense Refill  . amiodarone (PACERONE) 200 MG tablet Take 1 1/2 tablets by mouth Mon- Fri and take 2 tablets by mouth Sat and Sun 160 tablet 3  . carvedilol (COREG) 25 MG tablet Take 1 tablet (25 mg total) by mouth 2 (two) times daily with a meal. 60 tablet 3  . colchicine 0.6 MG tablet Take 0.6 mg by mouth 2 (two) times daily.    . dolutegravir (TIVICAY) 50 MG tablet Take 1 tablet (50 mg total) by mouth daily with lunch. 30 tablet 3  . Emtricitab-Rilpivir-Tenofovir 200-25-300 MG TABS Take 1 tablet by mouth daily with lunch. 30 tablet 3  . furosemide (LASIX) 40 MG tablet Take 40 mg by mouth 2 (two) times daily.     . potassium chloride SA (K-DUR,KLOR-CON) 20 MEQ tablet Take  20 mEq by mouth daily.    . sacubitril-valsartan (ENTRESTO) 24-26 MG Take 1 tablet by mouth 2 (two) times daily. 60 tablet 3  . spironolactone (ALDACTONE) 25 MG tablet Take 0.5 tablets (12.5 mg total) by mouth daily. 15 tablet 3  . traZODone (DESYREL) 100 MG tablet Take 1 tablet (100 mg total) by mouth at bedtime as needed for sleep. 30 tablet 5  . valACYclovir (VALTREX) 1000 MG tablet Take 1 tablet (1,000 mg total) by mouth 2 (two) times daily as needed (outbreaks). Take for 5 days for an outbreak 10 tablet 0   No current facility-administered medications for this encounter.    Filed Vitals:   07/30/14 1015  BP: 114/72  Pulse: 61  Weight: 175 lb 8 oz (79.606 kg)  SpO2: 94%    Physical Exam:  General: Well appearing, NAD.  HEENT:  normal.  Neck: supple. No JVD. Carotids 2+ bilat; no bruits. No lymphadenopathy or thryomegaly appreciated.  Cor: PMI laterally displaced. RRR, 2/6 MR  Lungs: CTA Abdomen: soft, nontender, mildly distended. No hepatosplenomegaly. No bruits or masses. Good bowel sounds.  Extremities: no cyanosis, clubbing, rash, edema  Neuro: alert & oriented x 3, cranial nerves grossly intact. moves all 4 extremities w/o difficulty. Affect pleasant.  ASSESSMENT & PLAN:  1) Chronic systolic HF: Nonischemic cardiomyopathy, ?due to ETOH abuse. EF 25% (09/2013) s/p Medtronic CRT-D (06/2013).  NYHA I-II symptoms and volume status stable despite weight gain.  Optivol.Fluid below threshold.  Activity ~6 hours per day.  - Will continue lasix 40 mg bid. - He cannot tolerate Imdur due to headache, so will stop hydralazine.  -- Continue coreg 25 mg BID, on goal dose. - Continue spironolactone 12.5 mg daily.  -Continue entresto 24-26 mg twice a day will not up titrate with mild dizziness. - Reviewed BMET from 07/04/2014. Stable renal function.   - Reinforced the need and importance of daily weights, a low sodium diet, and fluid restriction (less than 2 L a day). Instructed to call the HF clinic if weight increases more than 3 lbs overnight or 5 lbs in a week.  2) ETOH abuse:  Drinks wine cooler  About 3 times a week. Needs to stop.    3) Ventricular tachycardia: Followed by Dr. Ladona Ridgel. Continue amiodarone per Dr Ladona Ridgel.    TSH 1.088    Follow up in  6 weeks.   CLEGG,AMY NP-C  07/30/2014 10:17 AM

## 2014-07-31 ENCOUNTER — Telehealth (HOSPITAL_COMMUNITY): Payer: Self-pay | Admitting: Vascular Surgery

## 2014-08-02 ENCOUNTER — Telehealth (HOSPITAL_COMMUNITY): Payer: Self-pay | Admitting: Vascular Surgery

## 2014-08-02 NOTE — Telephone Encounter (Signed)
Spoke w/pharmacy they were out of Entresto but state it is in now and they are processing his prescription and he will be able to pick it up later today, left mess for pt

## 2014-08-02 NOTE — Telephone Encounter (Signed)
Pt called he is out of his Entrusto , the told the the pt that it was there but they needed to check with the office first.. Please advise

## 2014-08-13 ENCOUNTER — Telehealth (HOSPITAL_COMMUNITY): Payer: Self-pay | Admitting: Vascular Surgery

## 2014-08-14 ENCOUNTER — Telehealth: Payer: Self-pay | Admitting: Internal Medicine

## 2014-08-14 NOTE — Telephone Encounter (Signed)
New Msg       Pt states he was called by a nurse here who informed him that he may not be able to see Dr. Ladona Ridgel tomorrow?  Pt requesting call back in regards to this.

## 2014-08-14 NOTE — Telephone Encounter (Signed)
Closing....   Pt found correct area to send request

## 2014-08-15 ENCOUNTER — Ambulatory Visit (INDEPENDENT_AMBULATORY_CARE_PROVIDER_SITE_OTHER): Payer: Medicare Other | Admitting: Internal Medicine

## 2014-08-15 ENCOUNTER — Encounter: Payer: Self-pay | Admitting: Internal Medicine

## 2014-08-15 VITALS — BP 110/70 | HR 62 | Ht 64.5 in | Wt 176.2 lb

## 2014-08-15 DIAGNOSIS — I10 Essential (primary) hypertension: Secondary | ICD-10-CM | POA: Diagnosis not present

## 2014-08-15 DIAGNOSIS — Z95810 Presence of automatic (implantable) cardiac defibrillator: Secondary | ICD-10-CM

## 2014-08-15 DIAGNOSIS — I429 Cardiomyopathy, unspecified: Secondary | ICD-10-CM

## 2014-08-15 DIAGNOSIS — I5022 Chronic systolic (congestive) heart failure: Secondary | ICD-10-CM | POA: Diagnosis not present

## 2014-08-15 DIAGNOSIS — I472 Ventricular tachycardia: Secondary | ICD-10-CM

## 2014-08-15 DIAGNOSIS — I4729 Other ventricular tachycardia: Secondary | ICD-10-CM

## 2014-08-15 DIAGNOSIS — I4901 Ventricular fibrillation: Secondary | ICD-10-CM

## 2014-08-15 DIAGNOSIS — Z4502 Encounter for adjustment and management of automatic implantable cardiac defibrillator: Secondary | ICD-10-CM | POA: Diagnosis not present

## 2014-08-15 LAB — MDC_IDC_ENUM_SESS_TYPE_INCLINIC
Brady Statistic AP VP Percent: 1.85 %
Brady Statistic AP VS Percent: 0.05 %
Brady Statistic AS VP Percent: 96.5 %
Brady Statistic AS VS Percent: 1.6 %
Brady Statistic RV Percent Paced: 42.28 %
Date Time Interrogation Session: 20160316131300
HIGH POWER IMPEDANCE MEASURED VALUE: 304 Ohm
HIGH POWER IMPEDANCE MEASURED VALUE: 79 Ohm
Lead Channel Impedance Value: 513 Ohm
Lead Channel Impedance Value: 513 Ohm
Lead Channel Impedance Value: 532 Ohm
Lead Channel Impedance Value: 646 Ohm
Lead Channel Impedance Value: 836 Ohm
Lead Channel Pacing Threshold Amplitude: 0.75 V
Lead Channel Pacing Threshold Amplitude: 0.75 V
Lead Channel Pacing Threshold Amplitude: 1 V
Lead Channel Pacing Threshold Pulse Width: 0.4 ms
Lead Channel Pacing Threshold Pulse Width: 0.4 ms
Lead Channel Sensing Intrinsic Amplitude: 2.5 mV
Lead Channel Sensing Intrinsic Amplitude: 23.25 mV
Lead Channel Setting Pacing Pulse Width: 0.4 ms
Lead Channel Setting Pacing Pulse Width: 0.4 ms
Lead Channel Setting Sensing Sensitivity: 0.3 mV
MDC IDC MSMT BATTERY REMAINING LONGEVITY: 84 mo
MDC IDC MSMT BATTERY VOLTAGE: 2.98 V
MDC IDC MSMT LEADCHNL LV PACING THRESHOLD PULSEWIDTH: 0.4 ms
MDC IDC SET LEADCHNL LV PACING AMPLITUDE: 1.75 V
MDC IDC SET LEADCHNL RA PACING AMPLITUDE: 2 V
MDC IDC SET LEADCHNL RV PACING AMPLITUDE: 2.5 V
MDC IDC SET ZONE DETECTION INTERVAL: 300 ms
MDC IDC SET ZONE DETECTION INTERVAL: 350 ms
MDC IDC SET ZONE DETECTION INTERVAL: 360 ms
MDC IDC STAT BRADY RA PERCENT PACED: 1.91 %
Zone Setting Detection Interval: 350 ms

## 2014-08-15 MED ORDER — AMIODARONE HCL 200 MG PO TABS: ORAL_TABLET | ORAL | Status: DC

## 2014-08-15 NOTE — Assessment & Plan Note (Signed)
His ICD is working normally. Will recheck in several months.  

## 2014-08-15 NOTE — Progress Notes (Signed)
HPI [name] returns today for followup. He was hospitalized several months ago with VT storm and received multiple ICD shocks. No CAD at cath. His EF has gradually gone down. He was placed on amiodarone. In the interim, he has stopped smoking, THC and stopped drinking (except for an occaisional wine cooler). He has had no ICD shocks. He has had no chest pain or shortness of breath. No syncope. No peripheral edema. He has tolerated amiodarone.   Allergies  Allergen Reactions  . Bactrim Rash     Current Outpatient Prescriptions  Medication Sig Dispense Refill  . amiodarone (PACERONE) 200 MG tablet Take 1 1/2 tablets by mouth daily. 160 tablet 3  . carvedilol (COREG) 25 MG tablet Take 1 tablet (25 mg total) by mouth 2 (two) times daily with a meal. 60 tablet 3  . colchicine 0.6 MG tablet Take 0.6 mg by mouth 2 (two) times daily.    . dolutegravir (TIVICAY) 50 MG tablet Take 1 tablet (50 mg total) by mouth daily with lunch. 30 tablet 3  . Emtricitab-Rilpivir-Tenofovir 200-25-300 MG TABS Take 1 tablet by mouth daily with lunch. 30 tablet 3  . potassium chloride SA (K-DUR,KLOR-CON) 20 MEQ tablet Take 20 mEq by mouth daily.    . sacubitril-valsartan (ENTRESTO) 24-26 MG Take 1 tablet by mouth 2 (two) times daily. 60 tablet 3  . spironolactone (ALDACTONE) 25 MG tablet Take 0.5 tablets (12.5 mg total) by mouth daily. 15 tablet 3  . traZODone (DESYREL) 100 MG tablet Take 1 tablet (100 mg total) by mouth at bedtime as needed for sleep. 30 tablet 5  . valACYclovir (VALTREX) 1000 MG tablet Take 1 tablet (1,000 mg total) by mouth 2 (two) times daily as needed (outbreaks). Take for 5 days for an outbreak 10 tablet 0   No current facility-administered medications for this visit.     Past Medical History  Diagnosis Date  . Hypertension   . HIV infection   . Hepatitis     Hep B and Hep C (patient does not report this but these are listed in previous notes.)  . G6PD deficiency   . Chronic  systolic heart failure     a. Echo 12/05/11:  EF 20-25%, diff HK with mild sparing of IL wall, mild AI, mod MR, mild LAE, mild RVE, mild reduced RVF.;  b.  Echo 05/11/2012:  Mild LVH, EF 20-25%, Gr 1 diast dysfn, mod MR, mild LAE  . NICM (nonischemic cardiomyopathy)     cardia CTA 8/13 negative for obstructive CAD  . CHF (congestive heart failure)   . GERD (gastroesophageal reflux disease)   . Depression     ROS:   All systems reviewed and negative except as noted in the HPI.   Past Surgical History  Procedure Laterality Date  . Finger surgery      Thumb laceration.    . Esophagogastroduodenoscopy  12/07/2011    Procedure: ESOPHAGOGASTRODUODENOSCOPY (EGD);  Surgeon: [name], MD,FACG;  Location: [facility] ENDOSCOPY;  Service: Endoscopy;  Laterality: N/A;  . Electrophysiology study N/A 02/19/2012    Procedure: ELECTROPHYSIOLOGY STUDY;  Surgeon: Marinus Maw, MD;  Location: [facility] CATH LAB;  Service: Cardiovascular;  Laterality: N/A;  . Bi-ventricular pacemaker insertion N/A 06/26/2013    Procedure: BI-VENTRICULAR PACEMAKER INSERTION (CRT-P);  Surgeon: Marinus Maw, MD;  Location: [facility] CATH LAB;  Service: Cardiovascular;  Laterality: N/A;  . Left heart catheterization with coronary angiogram N/A 01/31/2014    Procedure: LEFT HEART CATHETERIZATION  WITH CORONARY ANGIOGRAM;  Surgeon: Iran Ouch, MD;  Location: [facility] CATH LAB;  Service: Cardiovascular;  Laterality: N/A;     Family History  Problem Relation Age of Onset  . Cancer Mother      History   Social History  . Marital Status: Single    Spouse Name: N/A  . Number of Children: N/A  . Years of Education: N/A   Occupational History  . Unemployed    Social History Main Topics  . Smoking status: Never Smoker   . Smokeless tobacco: Never Used  . Alcohol Use: 0.6 oz/week    1 Cans of beer per week     Comment: wine cooler  . Drug Use: 7.00 per week    Special: Marijuana     Comment: marijuana daily  . Sexual Activity:     Partners: Male     Comment: 28 year relationship, declined condoms   Other Topics Concern  . Not on file   Social History Narrative   Lives alone.  Drinks beer daily.  Liquor rarely.  He occasionally smokes marijuana..     BP 110/70 mmHg  Pulse 62  Ht 5' 4.5" (1.638 m)  Wt 176 lb 3.2 oz (79.924 kg)  BMI 29.79 kg/m2  Physical Exam:  Well appearing middle aged man, NAD HEENT: Unremarkable Neck:  No JVD, no thyromegally Back:  No CVA tenderness Lungs:  Clear with no wheezes HEART:  Regular rate rhythm, no murmurs, no rubs, no clicks Abd:  soft, positive bowel sounds, no organomegally, no rebound, no guarding Ext:  2 plus pulses, no edema, no cyanosis, no clubbing Skin:  No rashes no nodules Neuro:  CN II through XII intact, motor grossly intact   DEVICE  Normal device function.  See PaceArt for details.   Assess/Plan:

## 2014-08-15 NOTE — Patient Instructions (Addendum)
Your physician has recommended you make the following change in your medication:  1. Decrease Amiodarone to 1 & 1/2 tablet by mouth daily.  Remote monitoring is used to monitor your Pacemaker of ICD from home. This monitoring reduces the number of office visits required to check your device to one time per year. It allows [place] to keep an eye on the functioning of your device to ensure it is working properly. You are scheduled for a device check from home on 11/14/14. You may send your transmission at any time that day. If you have a wireless device, the transmission will be sent automatically. After your physician reviews your transmission, you will receive a postcard with your next transmission date.  Your physician wants you to follow-up in: 6 month with Dr. Ladona Ridgel. You will receive a reminder letter in the mail two months in advance. If you don't receive a letter, please call our office to schedule the follow-up appointment.

## 2014-08-15 NOTE — Assessment & Plan Note (Signed)
His chronic systolic heart failure remains class 2. He is much improved since his VT has been quiet and he has markedly reduced his ETOH consumption.

## 2014-08-15 NOTE — Assessment & Plan Note (Signed)
His blood pressure is well controlled. He will continue his current meds.  I have encouraged him to abstain from ETOH.

## 2014-08-15 NOTE — Assessment & Plan Note (Signed)
He has not had any additional arrhythmias. He will continue his current meds. I will reduce his dose of amiodarone.

## 2014-08-17 ENCOUNTER — Encounter: Payer: Self-pay | Admitting: Internal Medicine

## 2014-08-27 ENCOUNTER — Other Ambulatory Visit: Payer: Self-pay | Admitting: Licensed Clinical Social Worker

## 2014-08-27 MED ORDER — DOLUTEGRAVIR SODIUM 50 MG PO TABS: 50.0000 mg | ORAL_TABLET | Freq: Every day | ORAL | Status: DC

## 2014-09-03 ENCOUNTER — Other Ambulatory Visit: Payer: Self-pay | Admitting: Licensed Clinical Social Worker

## 2014-09-03 DIAGNOSIS — B20 Human immunodeficiency virus [HIV] disease: Secondary | ICD-10-CM

## 2014-09-03 MED ORDER — EMTRICITAB-RILPIVIR-TENOFOV DF 200-25-300 MG PO TABS: 1.0000 | ORAL_TABLET | Freq: Every day | ORAL | Status: DC

## 2014-09-28 ENCOUNTER — Other Ambulatory Visit (HOSPITAL_COMMUNITY): Payer: Self-pay | Admitting: Cardiology

## 2014-09-28 DIAGNOSIS — I5022 Chronic systolic (congestive) heart failure: Secondary | ICD-10-CM

## 2014-09-28 MED ORDER — SPIRONOLACTONE 25 MG PO TABS: 12.5000 mg | ORAL_TABLET | Freq: Every day | ORAL | Status: DC

## 2014-10-23 ENCOUNTER — Other Ambulatory Visit (INDEPENDENT_AMBULATORY_CARE_PROVIDER_SITE_OTHER): Payer: Medicare Other

## 2014-10-23 DIAGNOSIS — B20 Human immunodeficiency virus [HIV] disease: Secondary | ICD-10-CM

## 2014-10-23 LAB — COMPLETE METABOLIC PANEL WITH GFR
ALBUMIN: 3.9 g/dL (ref 3.5–5.2)
ALT: 103 U/L — ABNORMAL HIGH (ref 0–53)
AST: 81 U/L — ABNORMAL HIGH (ref 0–37)
Alkaline Phosphatase: 85 U/L (ref 39–117)
BILIRUBIN TOTAL: 0.4 mg/dL (ref 0.2–1.2)
BUN: 14 mg/dL (ref 6–23)
CO2: 23 meq/L (ref 19–32)
Calcium: 8.9 mg/dL (ref 8.4–10.5)
Chloride: 107 mEq/L (ref 96–112)
Creat: 1.28 mg/dL (ref 0.50–1.35)
GFR, EST NON AFRICAN AMERICAN: 64 mL/min
GFR, Est African American: 74 mL/min
Glucose, Bld: 101 mg/dL — ABNORMAL HIGH (ref 70–99)
Potassium: 3.7 mEq/L (ref 3.5–5.3)
Sodium: 141 mEq/L (ref 135–145)
Total Protein: 6.8 g/dL (ref 6.0–8.3)

## 2014-10-23 LAB — CBC WITH DIFFERENTIAL/PLATELET
BASOS ABS: 0.1 10*3/uL (ref 0.0–0.1)
Basophils Relative: 1 % (ref 0–1)
EOS ABS: 0.2 10*3/uL (ref 0.0–0.7)
EOS PCT: 2 % (ref 0–5)
HCT: 41.6 % (ref 39.0–52.0)
Hemoglobin: 14 g/dL (ref 13.0–17.0)
Lymphocytes Relative: 30 % (ref 12–46)
Lymphs Abs: 2.3 10*3/uL (ref 0.7–4.0)
MCH: 28.5 pg (ref 26.0–34.0)
MCHC: 33.7 g/dL (ref 30.0–36.0)
MCV: 84.6 fL (ref 78.0–100.0)
MPV: 9.7 fL (ref 8.6–12.4)
Monocytes Absolute: 0.8 10*3/uL (ref 0.1–1.0)
Monocytes Relative: 10 % (ref 3–12)
Neutro Abs: 4.3 10*3/uL (ref 1.7–7.7)
Neutrophils Relative %: 57 % (ref 43–77)
Platelets: 254 10*3/uL (ref 150–400)
RBC: 4.92 MIL/uL (ref 4.22–5.81)
RDW: 14.3 % (ref 11.5–15.5)
WBC: 7.6 10*3/uL (ref 4.0–10.5)

## 2014-10-24 LAB — T-HELPER CELL (CD4) - (RCID CLINIC ONLY)
CD4 T CELL ABS: 490 /uL (ref 400–2700)
CD4 T CELL HELPER: 21 % — AB (ref 33–55)

## 2014-10-25 LAB — HIV-1 RNA QUANT-NO REFLEX-BLD

## 2014-10-30 ENCOUNTER — Other Ambulatory Visit: Payer: Self-pay | Admitting: Internal Medicine

## 2014-11-06 ENCOUNTER — Ambulatory Visit (INDEPENDENT_AMBULATORY_CARE_PROVIDER_SITE_OTHER): Payer: Medicare Other | Admitting: Internal Medicine

## 2014-11-06 ENCOUNTER — Encounter: Payer: Self-pay | Admitting: Internal Medicine

## 2014-11-06 VITALS — BP 123/81 | HR 56 | Temp 98.8°F | Wt 171.0 lb

## 2014-11-06 DIAGNOSIS — B20 Human immunodeficiency virus [HIV] disease: Secondary | ICD-10-CM | POA: Diagnosis not present

## 2014-11-06 DIAGNOSIS — G4700 Insomnia, unspecified: Secondary | ICD-10-CM

## 2014-11-06 DIAGNOSIS — B182 Chronic viral hepatitis C: Secondary | ICD-10-CM

## 2014-11-06 DIAGNOSIS — M109 Gout, unspecified: Secondary | ICD-10-CM

## 2014-11-06 DIAGNOSIS — M10069 Idiopathic gout, unspecified knee: Secondary | ICD-10-CM

## 2014-11-06 NOTE — Progress Notes (Signed)
   Subjective:    Patient ID: [name], male    DOB: [birth year], 49 y.o.   MRN: [id number]  HPI [name] is a 49 y/o male here for HIV f/u. His CD4 count is 490 and undetectable VL. He continues to take his medicines every day and has not missed any doses. He states he recently ate some bad macaroni and cheese, became sick to his stomach and vomited 15 times yesterday. He also noticed a gout flare in his left ankle yesterday which is slightly red and warm to the touch. He states he took three tabs of colchicine yesterday morning and had thrown up that evening. He did not take any medications last night or this morning. He has not eaten or had anything to drink yet today either. He c/o not being able to sleep despite taking two trazadone's. He decided to crush one tab of trazadone, which he states worked well because it absorbed faster. He complains of feeling he feel itchy all over for months but does not have a rash and has not changed any soaps, medications or had any new exposures that he can think of. He denies any recent ICD shocks and has been followed by cardiology. He has mild transaminitis and he states he is being weaned off his amio by cardiology.   Review of Systems  Constitutional: Negative for fever, chills, diaphoresis, fatigue and unexpected weight change.  HENT: Negative for congestion, dental problem, mouth sores and sinus pressure.   Eyes: Negative for visual disturbance.  Respiratory: Negative for cough, chest tightness and shortness of breath.   Cardiovascular: Negative for chest pain, palpitations and leg swelling.  Gastrointestinal: Positive for nausea, vomiting and abdominal pain. Negative for diarrhea, constipation, blood in stool, abdominal distention and anal bleeding.  Genitourinary: Negative for difficulty urinating.  Musculoskeletal: Negative for myalgias and arthralgias.  Skin: Negative for color change and rash.  Neurological: Negative for dizziness,  weakness, numbness and headaches.  Hematological: Negative for adenopathy.  Psychiatric/Behavioral: Positive for sleep disturbance. Negative for decreased concentration and agitation.       Objective:   Physical Exam  Constitutional: He is oriented to person, place, and time. He appears well-developed and well-nourished.  HENT:  Head: Normocephalic and atraumatic.  Piercings, lip and ears  Eyes: Conjunctivae are normal. Pupils are equal, round, and reactive to light.  Neck: Normal range of motion. Neck supple.  Cardiovascular: Normal rate, regular rhythm and normal heart sounds.   Pulmonary/Chest: Effort normal and breath sounds normal.  Abdominal: Soft. Bowel sounds are normal.  Musculoskeletal: He exhibits edema and tenderness.  Left ankle gout flare, erythema, warmth and swelling  Lymphadenopathy:    He has no cervical adenopathy.  Neurological: He is alert and oriented to person, place, and time.  Skin: Skin is warm and dry.  Psychiatric: He has a normal mood and affect.    Lab Results  Component Value Date   CD4TABS 490 10/23/2014   CD4TABS 410 06/19/2014   CD4TABS 550 01/17/2014   Lab Results  Component Value Date   HIV1RNAQUANT <20 10/23/2014         Assessment & Plan:  HIV, well controlled: Continue Complera and Tivicay RTC in 4 months

## 2014-11-06 NOTE — Progress Notes (Signed)
   Subjective:    Patient ID: [name], male    DOB: Jul 29, 1965, 49 y.o.   MRN: [id number]  HPI Here for follow up of his HIV.  He is on Complera and Tivicay.  He has been well controlled and last CD4 490 with undetectable vl.  No issues with HIV.   For his advanced CM, he has not had any further firings of his ICD. He  Continues to be alcohol free except occasional wine cooler, marijuana free.    Having a gout flare of left ankle.  Took 3 colchicine yesterday.  Some nausea today.      Review of Systems  Constitutional: Negative for fever and fatigue.  HENT: Negative for trouble swallowing.   Respiratory: Negative for cough and shortness of breath.   Cardiovascular: Negative for leg swelling.  Gastrointestinal: Negative for nausea and abdominal pain.  Musculoskeletal:       Gouty pain  Skin: Negative for rash.  Neurological: Negative for dizziness, light-headedness and headaches.       Objective:   Physical Exam  Constitutional: He appears well-developed and well-nourished. No distress.  HENT:  Mouth/Throat: No oropharyngeal exudate.  Eyes: No scleral icterus.  Cardiovascular: Normal rate, regular rhythm and normal heart sounds.   No murmur heard. Pulmonary/Chest: Effort normal and breath sounds normal. No respiratory distress. He has no wheezes.  Musculoskeletal:  Warmth, edema in ankle  Lymphadenopathy:    He has no cervical adenopathy.  Skin: No rash noted.          Assessment & Plan:

## 2014-11-06 NOTE — Assessment & Plan Note (Signed)
Breaking trazodone seems to work so will continue with that.

## 2014-11-06 NOTE — Assessment & Plan Note (Signed)
Doing well with his salvage regimen.  Continue with the same. RTC 4-5 months.

## 2014-11-06 NOTE — Assessment & Plan Note (Signed)
Not a candidate for treatment with his limited life expectancy, no signs of cirrhosis on CT from 2014 or recent ultrasound.

## 2014-11-06 NOTE — Assessment & Plan Note (Addendum)
Take colchicine 1 twice a day.  OK to take 400 mg of ibuprofen as well 2-3 times a day for a short time.

## 2014-11-08 NOTE — Telephone Encounter (Signed)
Open in error

## 2014-11-09 ENCOUNTER — Other Ambulatory Visit (HOSPITAL_COMMUNITY): Payer: Self-pay | Admitting: *Deleted

## 2014-11-09 MED ORDER — SACUBITRIL-VALSARTAN 24-26 MG PO TABS: 1.0000 | ORAL_TABLET | Freq: Two times a day (BID) | ORAL | Status: DC

## 2014-11-12 ENCOUNTER — Other Ambulatory Visit: Payer: Self-pay | Admitting: Licensed Clinical Social Worker

## 2014-11-12 MED ORDER — COLCHICINE 0.6 MG PO TABS: 0.6000 mg | ORAL_TABLET | Freq: Two times a day (BID) | ORAL | Status: DC

## 2014-11-14 ENCOUNTER — Telehealth: Payer: Self-pay | Admitting: Cardiology

## 2014-11-14 ENCOUNTER — Ambulatory Visit (INDEPENDENT_AMBULATORY_CARE_PROVIDER_SITE_OTHER): Payer: Medicare Other | Admitting: *Deleted

## 2014-11-14 DIAGNOSIS — I429 Cardiomyopathy, unspecified: Secondary | ICD-10-CM

## 2014-11-14 NOTE — Telephone Encounter (Signed)
Spoke with pt and reminded pt of remote transmission that is due today. Pt verbalized understanding.   

## 2014-11-15 ENCOUNTER — Encounter: Payer: Self-pay | Admitting: Cardiology

## 2014-11-15 DIAGNOSIS — I429 Cardiomyopathy, unspecified: Secondary | ICD-10-CM | POA: Diagnosis not present

## 2014-11-20 NOTE — Progress Notes (Signed)
Remote ICD transmission.   

## 2014-11-21 LAB — CUP PACEART REMOTE DEVICE CHECK
Battery Remaining Longevity: 75 mo
Battery Voltage: 2.98 V
Brady Statistic AP VP Percent: 4.19 %
Brady Statistic AP VS Percent: 0.11 %
Brady Statistic AS VS Percent: 1.58 %
Brady Statistic RA Percent Paced: 4.3 %
Brady Statistic RV Percent Paced: 62.24 %
HighPow Impedance: 78 Ohm
Lead Channel Impedance Value: 456 Ohm
Lead Channel Impedance Value: 475 Ohm
Lead Channel Impedance Value: 608 Ohm
Lead Channel Pacing Threshold Amplitude: 0.625 V
Lead Channel Pacing Threshold Amplitude: 0.875 V
Lead Channel Pacing Threshold Pulse Width: 0.4 ms
Lead Channel Pacing Threshold Pulse Width: 0.4 ms
Lead Channel Pacing Threshold Pulse Width: 0.4 ms
Lead Channel Sensing Intrinsic Amplitude: 16.625 mV
Lead Channel Sensing Intrinsic Amplitude: 2 mV
Lead Channel Setting Pacing Amplitude: 1.75 V
Lead Channel Setting Pacing Pulse Width: 0.4 ms
MDC IDC MSMT LEADCHNL LV IMPEDANCE VALUE: 418 Ohm
MDC IDC MSMT LEADCHNL LV IMPEDANCE VALUE: 532 Ohm
MDC IDC MSMT LEADCHNL LV IMPEDANCE VALUE: 722 Ohm
MDC IDC MSMT LEADCHNL RA PACING THRESHOLD AMPLITUDE: 0.75 V
MDC IDC SESS DTM: 20160616181616
MDC IDC SET LEADCHNL LV PACING PULSEWIDTH: 0.4 ms
MDC IDC SET LEADCHNL RA PACING AMPLITUDE: 2 V
MDC IDC SET LEADCHNL RV PACING AMPLITUDE: 2.5 V
MDC IDC SET LEADCHNL RV SENSING SENSITIVITY: 0.3 mV
MDC IDC SET ZONE DETECTION INTERVAL: 350 ms
MDC IDC SET ZONE DETECTION INTERVAL: 360 ms
MDC IDC STAT BRADY AS VP PERCENT: 94.12 %
Zone Setting Detection Interval: 300 ms
Zone Setting Detection Interval: 350 ms

## 2014-12-04 ENCOUNTER — Encounter: Payer: Self-pay | Admitting: Cardiology

## 2014-12-04 ENCOUNTER — Other Ambulatory Visit (HOSPITAL_COMMUNITY): Payer: Self-pay | Admitting: *Deleted

## 2014-12-04 MED ORDER — CARVEDILOL 25 MG PO TABS: 25.0000 mg | ORAL_TABLET | Freq: Two times a day (BID) | ORAL | Status: DC

## 2014-12-07 ENCOUNTER — Encounter: Payer: Self-pay | Admitting: Internal Medicine

## 2014-12-17 ENCOUNTER — Telehealth: Payer: Self-pay | Admitting: *Deleted

## 2014-12-17 NOTE — Telephone Encounter (Signed)
Patient requesting Omeprazole 20mg  #30.  It is not an active medication on his list, was last filled 04/11/2013.  Please advise. Andree Coss, RN

## 2014-12-18 ENCOUNTER — Other Ambulatory Visit: Payer: Self-pay | Admitting: *Deleted

## 2014-12-18 ENCOUNTER — Encounter: Payer: Self-pay | Admitting: Cardiology

## 2014-12-18 DIAGNOSIS — K219 Gastro-esophageal reflux disease without esophagitis: Secondary | ICD-10-CM

## 2014-12-18 NOTE — Telephone Encounter (Signed)
ok 

## 2014-12-18 NOTE — Telephone Encounter (Signed)
I got a red flag - patient taking complera and tivicay.  Alternative?

## 2014-12-18 NOTE — Telephone Encounter (Signed)
No, with Complera no alternative.  Thanks for checking

## 2014-12-25 ENCOUNTER — Other Ambulatory Visit: Payer: Self-pay | Admitting: *Deleted

## 2014-12-25 MED ORDER — COLCHICINE 0.6 MG PO TABS: 0.6000 mg | ORAL_TABLET | Freq: Two times a day (BID) | ORAL | Status: DC | PRN

## 2015-01-28 ENCOUNTER — Other Ambulatory Visit (HOSPITAL_COMMUNITY): Payer: Self-pay | Admitting: *Deleted

## 2015-01-28 DIAGNOSIS — I5022 Chronic systolic (congestive) heart failure: Secondary | ICD-10-CM

## 2015-01-28 MED ORDER — SPIRONOLACTONE 25 MG PO TABS: 12.5000 mg | ORAL_TABLET | Freq: Every day | ORAL | Status: DC

## 2015-02-19 ENCOUNTER — Other Ambulatory Visit: Payer: Self-pay | Admitting: *Deleted

## 2015-02-19 DIAGNOSIS — B20 Human immunodeficiency virus [HIV] disease: Secondary | ICD-10-CM

## 2015-02-19 MED ORDER — EMTRICITAB-RILPIVIR-TENOFOV DF 200-25-300 MG PO TABS: 1.0000 | ORAL_TABLET | Freq: Every day | ORAL | Status: DC

## 2015-02-19 MED ORDER — DOLUTEGRAVIR SODIUM 50 MG PO TABS: 50.0000 mg | ORAL_TABLET | Freq: Every day | ORAL | Status: DC

## 2015-02-23 ENCOUNTER — Other Ambulatory Visit: Payer: Self-pay | Admitting: Internal Medicine

## 2015-02-25 NOTE — Telephone Encounter (Signed)
Medication was under discontinued med list at 07/30/14 visit with a reason of discontinued by provider. However in the assessment and plan portion of the note it says continue 40mg  bid.

## 2015-03-05 ENCOUNTER — Ambulatory Visit (HOSPITAL_COMMUNITY)
Admission: RE | Admit: 2015-03-05 | Discharge: 2015-03-05 | Disposition: A | Discharge status: Home / Self Care | Payer: Medicare Other | Source: Ambulatory Visit | Attending: Internal Medicine | Admitting: Internal Medicine

## 2015-03-05 ENCOUNTER — Encounter: Payer: Self-pay | Admitting: *Deleted

## 2015-03-05 VITALS — BP 118/76 | HR 58 | Wt 174.2 lb

## 2015-03-05 DIAGNOSIS — F329 Major depressive disorder, single episode, unspecified: Secondary | ICD-10-CM | POA: Diagnosis not present

## 2015-03-05 DIAGNOSIS — Z79899 Other long term (current) drug therapy: Secondary | ICD-10-CM | POA: Insufficient documentation

## 2015-03-05 DIAGNOSIS — K219 Gastro-esophageal reflux disease without esophagitis: Secondary | ICD-10-CM | POA: Diagnosis not present

## 2015-03-05 DIAGNOSIS — I472 Ventricular tachycardia: Secondary | ICD-10-CM | POA: Insufficient documentation

## 2015-03-05 DIAGNOSIS — I10 Essential (primary) hypertension: Secondary | ICD-10-CM | POA: Insufficient documentation

## 2015-03-05 DIAGNOSIS — F1010 Alcohol abuse, uncomplicated: Secondary | ICD-10-CM | POA: Diagnosis not present

## 2015-03-05 DIAGNOSIS — Z95810 Presence of automatic (implantable) cardiac defibrillator: Secondary | ICD-10-CM | POA: Insufficient documentation

## 2015-03-05 DIAGNOSIS — E7401 von Gierke disease: Secondary | ICD-10-CM | POA: Insufficient documentation

## 2015-03-05 DIAGNOSIS — B20 Human immunodeficiency virus [HIV] disease: Secondary | ICD-10-CM | POA: Insufficient documentation

## 2015-03-05 DIAGNOSIS — I5022 Chronic systolic (congestive) heart failure: Secondary | ICD-10-CM | POA: Insufficient documentation

## 2015-03-05 DIAGNOSIS — I428 Other cardiomyopathies: Secondary | ICD-10-CM | POA: Diagnosis not present

## 2015-03-05 MED ORDER — SACUBITRIL-VALSARTAN 49-51 MG PO TABS: 1.0000 | ORAL_TABLET | Freq: Two times a day (BID) | ORAL | Status: DC

## 2015-03-05 NOTE — Progress Notes (Signed)
[facility]  Patient ID: ROLLAND Rollins, male   DOB: [birth year], 49 y.o.   MRN: [id number]  ---Medtronic Optivol ----   Primary Physician: [name], MD  Primary Cardiologist: Dr Ladona Ridgel    HPI: [name] is a 50 y.o. male with a history of HIV 1998, HCV, ETOH abuse and CHF due to NICM s/p Medtronic  CRT-D (06/2013). He also has a h/o syncope and previously had an EP study which was non-inducible for VT.    Was admitted 01/19/13- 01/22/13 for acute on chronic systolic HF. ECHO EF 20-25%. He was diuresed with IV lasix. He was discharged on coreg 6.25 mg BID, digoxin 0.25 mg daily, lisinopril 40 mg daily, and spironolactone 25 mg daily. Discharge weight 150 lbs. He stopped spironolactone due to severe headaches.  He was not able to tolerate titration of Coreg to 25 mg bid due to lightheadedness.   Follow up for Heart Failure: Overall feeling very good.  Very active. Now on Entresto 24/26. Weight at home stable at 170 pounds but he is frustrated about his belly..Denies SOB/PND/Orthopnea. No shocks. Taking colchicine for gout.    ECHO 04/2013: EF 20-25%, mod/severe MR, LA mod dilated ECHO 10/16/13 EF 20-25%   Labs (12/14): digoxin 0.5 Labs (2/15): K 4.2, creatinine 0.88 Labs (08/2013): K 4.2 Creatinine 1.02 Labs (10/15): K 4.0, creatinine 1.16 Labs (1/16): K 4.1, creatinine 1.28, AST 54, ALT 72 Labs (07/04/2014) : K 4.1 Creatinine 1.18   Optivol: Activy ~6 hours  Fluid below threshold but trending up. No VT.   SH: Lives with partner in [place], drinking a wine cooler twice a week (prior heavy ETOH).   ROS: All systems negative except as listed in HPI, PMH and Problem List.  Past Medical History  Diagnosis Date  . Hypertension   . HIV infection   . Hepatitis     Hep B and Hep C (patient does not report this but these are listed in previous notes.)  . G6PD deficiency   . Chronic systolic heart failure     a. Echo 12/05/11:  EF 20-25%, diff HK with mild  sparing of IL wall, mild AI, mod MR, mild LAE, mild RVE, mild reduced RVF.;  b.  Echo 05/11/2012:  Mild LVH, EF 20-25%, Gr 1 diast dysfn, mod MR, mild LAE  . NICM (nonischemic cardiomyopathy)     cardia CTA 8/13 negative for obstructive CAD  . CHF (congestive heart failure)   . GERD (gastroesophageal reflux disease)   . Depression     Current Outpatient Prescriptions  Medication Sig Dispense Refill  . amiodarone (PACERONE) 200 MG tablet Take 1 1/2 tablets by mouth daily. 160 tablet 3  . carvedilol (COREG) 25 MG tablet Take 1 tablet (25 mg total) by mouth 2 (two) times daily with a meal. 60 tablet 3  . colchicine 0.6 MG tablet Take 1 tablet (0.6 mg total) by mouth 2 (two) times daily as needed. For gout flares. 30 tablet 2  . dolutegravir (TIVICAY) 50 MG tablet Take 1 tablet (50 mg total) by mouth daily with lunch. 30 tablet 3  . Emtricitab-Rilpivir-Tenofov DF 200-25-300 MG TABS Take 1 tablet by mouth daily with lunch. 30 tablet 3  . furosemide (LASIX) 40 MG tablet take 2 tablets by mouth every morning and 1 tablet IN THE EVENING 183 tablet 1  . potassium chloride SA (K-DUR,KLOR-CON) 20 MEQ tablet Take 20 mEq by mouth daily.    . sacubitril-valsartan (ENTRESTO) 24-26 MG Take 1  tablet by mouth 2 (two) times daily. 60 tablet 3  . spironolactone (ALDACTONE) 25 MG tablet Take 0.5 tablets (12.5 mg total) by mouth daily. 15 tablet 3  . traZODone (DESYREL) 100 MG tablet Take 1 tablet (100 mg total) by mouth at bedtime as needed for sleep. 30 tablet 5  . valACYclovir (VALTREX) 1000 MG tablet Take 1 tablet (1,000 mg total) by mouth 2 (two) times daily as needed (outbreaks). Take for 5 days for an outbreak 10 tablet 0   No current facility-administered medications for this encounter.    Filed Vitals:   03/05/15 1201  BP: 118/76  Pulse: 58  Weight: 174 lb 4 oz (79.039 kg)  SpO2: 97%    Physical Exam:  General: Well appearing, NAD.  HEENT: normal.  Neck: supple. No JVD. Carotids 2+ bilat; no  bruits. No lymphadenopathy or thryomegaly appreciated.  Cor: PMI laterally displaced. RRR, 2/6 MR  Lungs: CTA Abdomen: soft, nontender, mildly distended. No hepatosplenomegaly. No bruits or masses. Good bowel sounds.  Extremities: no cyanosis, clubbing, rash, edema  Neuro: alert & oriented x 3, cranial nerves grossly intact. moves all 4 extremities w/o difficulty. Affect pleasant.  ASSESSMENT & PLAN:  1) Chronic systolic HF: Nonischemic cardiomyopathy, ?due to ETOH abuse. EF 25% (09/2013) s/p Medtronic CRT-D (06/2013).  NYHA I-II symptoms and volume status stable despite weight gain.  - ICD interrogation. No VT. Optivol Fluid creeping up but below threshold.  Activity ~6 hours per day.  - Will continue lasix 40 mg bid. Can take extra as needed for increasing fluid.  - He cannot tolerate Imdur due to headache, so will stop hydralazine.  --Continue coreg 25 mg BID, on goal dose. - Continue spironolactone 12.5 mg daily.  - Increase entresto 49-51 mg twice a day. BMET 1-2 weeks - He is due for repeat echo  - Reinforced the need and importance of daily weights, a low sodium diet, and fluid restriction (less than 2 L a day). Instructed to call the HF clinic if weight increases more than 3 lbs overnight or 5 lbs in a week.  2) ETOH abuse:  Drinks wine cooler occasionally. Needs to stop.    3) Ventricular tachycardia: Followed by Dr. Ladona Ridgel. Continue amiodarone per Dr Ladona Ridgel.    TSH 1.088    Follow up in  3 months  [name]  03/05/2015 12:39 PM

## 2015-03-05 NOTE — Addendum Note (Signed)
Encounter addended by: Lake Bells, RN on: 03/05/2015  1:20 PM<BR>     Documentation filed: Dx Association, Notes Section, Medications, Patient Instructions Section, Orders

## 2015-03-05 NOTE — Patient Instructions (Addendum)
Return in 2 weeks for BMET ( lab work)  Echo will be scheduled.  Take 20 mg extra of Lasix on 10/4 and 10/5 then as needed for increasing fluid  Increase Entesto to 49.51  Follow up in 3 months

## 2015-03-05 NOTE — Progress Notes (Signed)
Vest Reading done = 29

## 2015-03-07 ENCOUNTER — Ambulatory Visit (INDEPENDENT_AMBULATORY_CARE_PROVIDER_SITE_OTHER): Payer: Medicare Other | Admitting: Internal Medicine

## 2015-03-07 ENCOUNTER — Encounter: Payer: Self-pay | Admitting: Internal Medicine

## 2015-03-07 ENCOUNTER — Other Ambulatory Visit: Payer: Medicare Other

## 2015-03-07 VITALS — BP 110/80 | HR 57 | Ht 64.5 in | Wt 172.6 lb

## 2015-03-07 DIAGNOSIS — I472 Ventricular tachycardia, unspecified: Secondary | ICD-10-CM

## 2015-03-07 DIAGNOSIS — Z0389 Encounter for observation for other suspected diseases and conditions ruled out: Secondary | ICD-10-CM

## 2015-03-07 DIAGNOSIS — B20 Human immunodeficiency virus [HIV] disease: Secondary | ICD-10-CM

## 2015-03-07 DIAGNOSIS — I429 Cardiomyopathy, unspecified: Secondary | ICD-10-CM

## 2015-03-07 DIAGNOSIS — I5022 Chronic systolic (congestive) heart failure: Secondary | ICD-10-CM | POA: Diagnosis not present

## 2015-03-07 DIAGNOSIS — F1210 Cannabis abuse, uncomplicated: Secondary | ICD-10-CM

## 2015-03-07 DIAGNOSIS — I4729 Other ventricular tachycardia: Secondary | ICD-10-CM

## 2015-03-07 DIAGNOSIS — I10 Essential (primary) hypertension: Secondary | ICD-10-CM

## 2015-03-07 DIAGNOSIS — Z95810 Presence of automatic (implantable) cardiac defibrillator: Secondary | ICD-10-CM

## 2015-03-07 DIAGNOSIS — I4901 Ventricular fibrillation: Secondary | ICD-10-CM

## 2015-03-07 DIAGNOSIS — Z4502 Encounter for adjustment and management of automatic implantable cardiac defibrillator: Secondary | ICD-10-CM

## 2015-03-07 MED ORDER — AMIODARONE HCL 200 MG PO TABS: ORAL_TABLET | ORAL | Status: DC

## 2015-03-07 NOTE — Assessment & Plan Note (Signed)
His blood pressure is well controlled. Will follow. 

## 2015-03-07 NOTE — Assessment & Plan Note (Signed)
He has not had additional VT in over a year. We will continue his slow wean of his amiodarone.

## 2015-03-07 NOTE — Patient Instructions (Signed)
Medication Instructions:   DECREASE AMIODARONE TO 1 AND 1/2 TABLETS Monday THROUGH Thursday AND 1 TABLET Friday, Saturday AND SUNDAY   Follow-Up:  Your physician wants you to follow-up in: ONE YEAR WITH DR Court Joy will receive a reminder letter in the mail two months in advance. If you don't receive a letter, please call our office to schedule the follow-up appointment.

## 2015-03-07 NOTE — Assessment & Plan Note (Signed)
His symptoms have gone from class 3B to class 2A. He will continue his current meds.

## 2015-03-07 NOTE — Assessment & Plan Note (Signed)
He denies continued use.

## 2015-03-07 NOTE — Progress Notes (Signed)
HPI [name] returns today for followup. He was hospitalized 12 months ago with VT storm and received multiple ICD shocks. No CAD at cath. He was placed on amiodarone. In the interim, he has stopped smoking, THC and stopped drinking (except for an occaisional wine cooler). He has had no ICD shocks. He has had no chest pain or shortness of breath. No syncope. No peripheral edema. He has tolerated amiodarone.   Allergies  Allergen Reactions  . Bactrim Rash     Current Outpatient Prescriptions  Medication Sig Dispense Refill  . amiodarone (PACERONE) 200 MG tablet Take 1 1/2 tablets by mouth Monday THRU Thursday 1 TABLET FRI,SAT,SUN 160 tablet 3  . carvedilol (COREG) 25 MG tablet Take 1 tablet (25 mg total) by mouth 2 (two) times daily with a meal. 60 tablet 3  . colchicine 0.6 MG tablet Take 1 tablet (0.6 mg total) by mouth 2 (two) times daily as needed. For gout flares. 30 tablet 2  . dolutegravir (TIVICAY) 50 MG tablet Take 1 tablet (50 mg total) by mouth daily with lunch. 30 tablet 3  . Emtricitab-Rilpivir-Tenofov DF 200-25-300 MG TABS Take 1 tablet by mouth daily with lunch. 30 tablet 3  . furosemide (LASIX) 40 MG tablet take 2 tablets by mouth every morning and 1 tablet IN THE EVENING 183 tablet 1  . potassium chloride SA (K-DUR,KLOR-CON) 20 MEQ tablet Take 20 mEq by mouth daily.    . sacubitril-valsartan (ENTRESTO) 49-51 MG Take 1 tablet by mouth 2 (two) times daily. 60 tablet 0  . spironolactone (ALDACTONE) 25 MG tablet Take 0.5 tablets (12.5 mg total) by mouth daily. 15 tablet 3  . traZODone (DESYREL) 100 MG tablet Take 1 tablet (100 mg total) by mouth at bedtime as needed for sleep. 30 tablet 5  . valACYclovir (VALTREX) 1000 MG tablet Take 1 tablet (1,000 mg total) by mouth 2 (two) times daily as needed (outbreaks). Take for 5 days for an outbreak 10 tablet 0   No current facility-administered medications for this visit.     Past Medical History  Diagnosis Date  .  Hypertension   . HIV infection (HCC)   . Hepatitis     Hep B and Hep C (patient does not report this but these are listed in previous notes.)  . G6PD deficiency (HCC)   . Chronic systolic heart failure (HCC)     a. Echo 12/05/11:  EF 20-25%, diff HK with mild sparing of IL wall, mild AI, mod MR, mild LAE, mild RVE, mild reduced RVF.;  b.  Echo 05/11/2012:  Mild LVH, EF 20-25%, Gr 1 diast dysfn, mod MR, mild LAE  . NICM (nonischemic cardiomyopathy) (HCC)     cardia CTA 8/13 negative for obstructive CAD  . CHF (congestive heart failure) (HCC)   . GERD (gastroesophageal reflux disease)   . Depression     ROS:   All systems reviewed and negative except as noted in the HPI.   Past Surgical History  Procedure Laterality Date  . Finger surgery      Thumb laceration.    . Esophagogastroduodenoscopy  12/07/2011    Procedure: ESOPHAGOGASTRODUODENOSCOPY (EGD);  Surgeon: [name], MD,FACG;  Location: [facility] ENDOSCOPY;  Service: Endoscopy;  Laterality: N/A;  . Electrophysiology study N/A 02/19/2012    Procedure: ELECTROPHYSIOLOGY STUDY;  Surgeon: Marinus Maw, MD;  Location: [facility] CATH LAB;  Service: Cardiovascular;  Laterality: N/A;  . Bi-ventricular pacemaker insertion N/A 06/26/2013    Procedure: BI-VENTRICULAR  PACEMAKER INSERTION (CRT-P);  Surgeon: Marinus Maw, MD;  Location: [facility] CATH LAB;  Service: Cardiovascular;  Laterality: N/A;  . Left heart catheterization with coronary angiogram N/A 01/31/2014    Procedure: LEFT HEART CATHETERIZATION WITH CORONARY ANGIOGRAM;  Surgeon: Iran Ouch, MD;  Location: [facility] CATH LAB;  Service: Cardiovascular;  Laterality: N/A;     Family History  Problem Relation Age of Onset  . Lung cancer Mother   . Colon cancer  50  . Colon cancer  71     Social History   Social History  . Marital Status: Single    Spouse Name: N/A  . Number of Children: N/A  . Years of Education: N/A   Occupational History  . Unemployed    Social History Main Topics  .  Smoking status: Never Smoker   . Smokeless tobacco: Never Used  . Alcohol Use: 0.6 oz/week    1 Cans of beer per week     Comment: wine cooler  . Drug Use: 7.00 per week    Special: Marijuana     Comment: marijuana daily  . Sexual Activity:    Partners: Male     Comment: 28 year relationship, declined condoms   Other Topics Concern  . Not on file   Social History Narrative   Lives alone.  Drinks beer daily.  Liquor rarely.  He occasionally smokes marijuana..     BP 110/80 mmHg  Pulse 57  Ht 5' 4.5" (1.638 m)  Wt 172 lb 9.6 oz (78.291 kg)  BMI 29.18 kg/m2  Physical Exam:  Well appearing middle aged man, NAD HEENT: Unremarkable Neck:  6 cm JVD, no thyromegally Back:  No CVA tenderness Lungs:  Clear with no wheezes HEART:  Regular rate rhythm, no murmurs, no rubs, no clicks Abd:  soft, positive bowel sounds, no organomegally, no rebound, no guarding Ext:  2 plus pulses, no edema, no cyanosis, no clubbing Skin:  No rashes no nodules Neuro:  CN II through XII intact, motor grossly intact   DEVICE  Normal device function.  See PaceArt for details.   Assess/Plan:

## 2015-03-07 NOTE — Assessment & Plan Note (Signed)
His Biv ICD is working normally. Will recheck in several months.

## 2015-03-08 LAB — HIV-1 RNA QUANT-NO REFLEX-BLD
HIV 1 RNA Quant: <20 copies/mL (ref <20)
HIV-1 RNA Quant, Log: <1.3 {Log} (ref <1.30)

## 2015-03-08 LAB — CUP PACEART INCLINIC DEVICE CHECK
Brady Statistic AP VS Percent: 0.11 %
Brady Statistic AS VP Percent: 94.04 %
Brady Statistic RA Percent Paced: 4.37 %
Date Time Interrogation Session: 20161006130531
HIGH POWER IMPEDANCE MEASURED VALUE: 93 Ohm
HighPow Impedance: 285 Ohm
Lead Channel Impedance Value: 456 Ohm
Lead Channel Impedance Value: 456 Ohm
Lead Channel Impedance Value: 646 Ohm
Lead Channel Pacing Threshold Amplitude: 0.625 V
Lead Channel Pacing Threshold Amplitude: 0.75 V
Lead Channel Pacing Threshold Pulse Width: 0.4 ms
Lead Channel Pacing Threshold Pulse Width: 0.4 ms
Lead Channel Sensing Intrinsic Amplitude: 16.25 mV
Lead Channel Sensing Intrinsic Amplitude: 17.75 mV
Lead Channel Setting Pacing Amplitude: 1.75 V
Lead Channel Setting Pacing Amplitude: 2.5 V
Lead Channel Setting Pacing Pulse Width: 0.4 ms
Lead Channel Setting Pacing Pulse Width: 0.4 ms
MDC IDC MSMT BATTERY REMAINING LONGEVITY: 70 mo
MDC IDC MSMT BATTERY VOLTAGE: 2.98 V
MDC IDC MSMT LEADCHNL LV IMPEDANCE VALUE: 760 Ohm
MDC IDC MSMT LEADCHNL RA IMPEDANCE VALUE: 551 Ohm
MDC IDC MSMT LEADCHNL RA SENSING INTR AMPL: 1.875 mV
MDC IDC MSMT LEADCHNL RA SENSING INTR AMPL: 2.125 mV
MDC IDC MSMT LEADCHNL RV PACING THRESHOLD AMPLITUDE: 0.75 V
MDC IDC MSMT LEADCHNL RV PACING THRESHOLD PULSEWIDTH: 0.4 ms
MDC IDC SET LEADCHNL RA PACING AMPLITUDE: 2 V
MDC IDC SET LEADCHNL RV SENSING SENSITIVITY: 0.3 mV
MDC IDC SET ZONE DETECTION INTERVAL: 300 ms
MDC IDC SET ZONE DETECTION INTERVAL: 350 ms
MDC IDC STAT BRADY AP VP PERCENT: 4.26 %
MDC IDC STAT BRADY AS VS PERCENT: 1.59 %
MDC IDC STAT BRADY RV PERCENT PACED: 59.34 %
Zone Setting Detection Interval: 350 ms
Zone Setting Detection Interval: 360 ms

## 2015-03-08 LAB — T-HELPER CELL (CD4) - (RCID CLINIC ONLY)
CD4 T CELL ABS: 350 /uL — AB (ref 400–2700)
CD4 T CELL HELPER: 24 % — AB (ref 33–55)

## 2015-03-14 ENCOUNTER — Ambulatory Visit (HOSPITAL_COMMUNITY): Payer: Medicare Other | Attending: Cardiology

## 2015-03-14 ENCOUNTER — Other Ambulatory Visit: Payer: Self-pay

## 2015-03-14 DIAGNOSIS — I10 Essential (primary) hypertension: Secondary | ICD-10-CM | POA: Diagnosis not present

## 2015-03-14 DIAGNOSIS — I071 Rheumatic tricuspid insufficiency: Secondary | ICD-10-CM | POA: Insufficient documentation

## 2015-03-14 DIAGNOSIS — I509 Heart failure, unspecified: Secondary | ICD-10-CM | POA: Insufficient documentation

## 2015-03-14 DIAGNOSIS — I340 Nonrheumatic mitral (valve) insufficiency: Secondary | ICD-10-CM | POA: Insufficient documentation

## 2015-03-14 DIAGNOSIS — I5022 Chronic systolic (congestive) heart failure: Secondary | ICD-10-CM

## 2015-03-14 DIAGNOSIS — Z87891 Personal history of nicotine dependence: Secondary | ICD-10-CM | POA: Insufficient documentation

## 2015-03-14 DIAGNOSIS — B20 Human immunodeficiency virus [HIV] disease: Secondary | ICD-10-CM | POA: Insufficient documentation

## 2015-03-14 DIAGNOSIS — I517 Cardiomegaly: Secondary | ICD-10-CM | POA: Diagnosis not present

## 2015-03-14 DIAGNOSIS — Z95810 Presence of automatic (implantable) cardiac defibrillator: Secondary | ICD-10-CM | POA: Diagnosis not present

## 2015-03-19 ENCOUNTER — Other Ambulatory Visit: Payer: Self-pay | Admitting: Internal Medicine

## 2015-03-20 ENCOUNTER — Ambulatory Visit (HOSPITAL_COMMUNITY)
Admission: RE | Admit: 2015-03-20 | Discharge: 2015-03-20 | Disposition: A | Discharge status: Home / Self Care | Payer: Medicare Other | Source: Ambulatory Visit | Attending: Cardiology | Admitting: Cardiology

## 2015-03-20 DIAGNOSIS — I5022 Chronic systolic (congestive) heart failure: Secondary | ICD-10-CM

## 2015-03-20 DIAGNOSIS — I10 Essential (primary) hypertension: Secondary | ICD-10-CM | POA: Diagnosis not present

## 2015-03-20 LAB — BASIC METABOLIC PANEL
Anion gap: 11 (ref 5–15)
BUN: 18 mg/dL (ref 6–20)
CALCIUM: 9.1 mg/dL (ref 8.9–10.3)
CHLORIDE: 101 mmol/L (ref 101–111)
CO2: 27 mmol/L (ref 22–32)
CREATININE: 1.41 mg/dL — AB (ref 0.61–1.24)
GFR calc Af Amer: >60 mL/min (ref >60)
GFR calc non Af Amer: 56 mL/min — ABNORMAL LOW (ref >60)
GLUCOSE: 129 mg/dL — AB (ref 65–99)
Potassium: 4.1 mmol/L (ref 3.5–5.1)
Sodium: 139 mmol/L (ref 135–145)

## 2015-03-21 ENCOUNTER — Encounter: Payer: Self-pay | Admitting: Internal Medicine

## 2015-03-21 ENCOUNTER — Ambulatory Visit (INDEPENDENT_AMBULATORY_CARE_PROVIDER_SITE_OTHER): Payer: Medicare Other | Admitting: Internal Medicine

## 2015-03-21 VITALS — BP 112/73 | HR 64 | Temp 98.1°F | Ht 64.5 in | Wt 174.8 lb

## 2015-03-21 DIAGNOSIS — Z113 Encounter for screening for infections with a predominantly sexual mode of transmission: Secondary | ICD-10-CM | POA: Diagnosis not present

## 2015-03-21 DIAGNOSIS — M25572 Pain in left ankle and joints of left foot: Secondary | ICD-10-CM | POA: Diagnosis not present

## 2015-03-21 DIAGNOSIS — B20 Human immunodeficiency virus [HIV] disease: Secondary | ICD-10-CM | POA: Diagnosis present

## 2015-03-21 DIAGNOSIS — Z23 Encounter for immunization: Secondary | ICD-10-CM | POA: Diagnosis not present

## 2015-03-21 DIAGNOSIS — M25579 Pain in unspecified ankle and joints of unspecified foot: Secondary | ICD-10-CM | POA: Insufficient documentation

## 2015-03-21 DIAGNOSIS — I429 Cardiomyopathy, unspecified: Secondary | ICD-10-CM | POA: Diagnosis not present

## 2015-03-21 DIAGNOSIS — Z79899 Other long term (current) drug therapy: Secondary | ICD-10-CM | POA: Diagnosis not present

## 2015-03-21 DIAGNOSIS — G4700 Insomnia, unspecified: Secondary | ICD-10-CM

## 2015-03-21 NOTE — Assessment & Plan Note (Signed)
Doing great, will continue with his salvage regimen.  RTC 6 months.

## 2015-03-21 NOTE — Assessment & Plan Note (Signed)
From bumping it, no cncerning signs on exam.

## 2015-03-21 NOTE — Assessment & Plan Note (Signed)
Can't take Ambien and trazodone not working.  No other options at this time so will stop trazodone.

## 2015-03-21 NOTE — Progress Notes (Signed)
   Subjective:    Patient ID: [name], male    DOB: 1966-01-19, 49 y.o.   MRN: [id number]  HPI Here for follow up of his HIV.  He is on Complera and Tivicay.  He has been well controlled and CD4 350 with undetectable vl.  No issues with HIV.  No missed doses.     For his advanced CM, recent echo with an EF of 55%, which is much improved!  Still occasional beer.  No ICD firing over 1 year.    Has pain in the left foot where he bumped it.   Still some issues sleeping and trazodone makes him too drowsy so is stopping it.       Review of Systems  Constitutional: Negative for fever and fatigue.  HENT: Negative for trouble swallowing.   Respiratory: Negative for cough and shortness of breath.   Gastrointestinal: Negative for nausea.  Skin: Negative for rash.  Neurological: Negative for dizziness, light-headedness and headaches.       Objective:   Physical Exam  Constitutional: He appears well-developed and well-nourished. No distress.  HENT:  Mouth/Throat: No oropharyngeal exudate.  Eyes: No scleral icterus.  Cardiovascular: Normal rate, regular rhythm and normal heart sounds.   No murmur heard. Pulmonary/Chest: Effort normal and breath sounds normal. No respiratory distress. He has no wheezes.  Musculoskeletal:  Left foot with no erythema, mild tenderness at sight of bump, no swelling.   Lymphadenopathy:    He has no cervical adenopathy.  Skin: No rash noted.   SHx: still occasional beer, no liquor, no wine, no more than 2 beers.         Assessment & Plan:

## 2015-03-21 NOTE — Assessment & Plan Note (Signed)
I discussed his recent echo results and appears much better.  I explained that a lot of that is much less alcohol and he needs to continue as such to maintain that.

## 2015-03-25 ENCOUNTER — Other Ambulatory Visit (HOSPITAL_COMMUNITY): Payer: Self-pay | Admitting: *Deleted

## 2015-03-25 MED ORDER — CARVEDILOL 25 MG PO TABS: 25.0000 mg | ORAL_TABLET | Freq: Two times a day (BID) | ORAL | Status: DC

## 2015-04-08 ENCOUNTER — Encounter: Payer: Self-pay | Admitting: Internal Medicine

## 2015-04-23 ENCOUNTER — Other Ambulatory Visit (HOSPITAL_COMMUNITY): Payer: Self-pay | Admitting: *Deleted

## 2015-04-23 MED ORDER — SACUBITRIL-VALSARTAN 49-51 MG PO TABS: 1.0000 | ORAL_TABLET | Freq: Two times a day (BID) | ORAL | Status: DC

## 2015-05-24 ENCOUNTER — Other Ambulatory Visit (HOSPITAL_COMMUNITY): Payer: Self-pay | Admitting: *Deleted

## 2015-05-24 DIAGNOSIS — I5022 Chronic systolic (congestive) heart failure: Secondary | ICD-10-CM

## 2015-05-24 MED ORDER — SPIRONOLACTONE 25 MG PO TABS: 12.5000 mg | ORAL_TABLET | Freq: Every day | ORAL | Status: DC

## 2015-06-10 ENCOUNTER — Telehealth: Payer: Self-pay | Admitting: Cardiology

## 2015-06-10 ENCOUNTER — Ambulatory Visit (INDEPENDENT_AMBULATORY_CARE_PROVIDER_SITE_OTHER): Payer: Medicare Other | Admitting: *Deleted

## 2015-06-10 DIAGNOSIS — I5022 Chronic systolic (congestive) heart failure: Secondary | ICD-10-CM

## 2015-06-10 DIAGNOSIS — I429 Cardiomyopathy, unspecified: Secondary | ICD-10-CM | POA: Diagnosis not present

## 2015-06-10 NOTE — Progress Notes (Signed)
Remote ICD transmission.   

## 2015-06-10 NOTE — Telephone Encounter (Signed)
Confirmed remote transmission w/ pt.   

## 2015-06-20 LAB — CUP PACEART REMOTE DEVICE CHECK
Battery Remaining Longevity: 66 mo
Brady Statistic RA Percent Paced: 2.13 %
Brady Statistic RV Percent Paced: 31.35 %
Date Time Interrogation Session: 20170109184742
HighPow Impedance: 78 Ohm
Implantable Lead Implant Date: 20150126
Implantable Lead Implant Date: 20150126
Implantable Lead Model: 6935
Lead Channel Impedance Value: 475 Ohm
Lead Channel Pacing Threshold Amplitude: 0.75 V
Lead Channel Pacing Threshold Pulse Width: 0.4 ms
Lead Channel Sensing Intrinsic Amplitude: 1.375 mV
Lead Channel Sensing Intrinsic Amplitude: 17.125 mV
Lead Channel Sensing Intrinsic Amplitude: 17.125 mV
Lead Channel Setting Pacing Pulse Width: 0.4 ms
MDC IDC LEAD IMPLANT DT: 20150126
MDC IDC LEAD LOCATION: 753858
MDC IDC LEAD LOCATION: 753859
MDC IDC LEAD LOCATION: 753860
MDC IDC LEAD MODEL: 4396
MDC IDC MSMT BATTERY VOLTAGE: 2.98 V
MDC IDC MSMT LEADCHNL LV IMPEDANCE VALUE: 551 Ohm
MDC IDC MSMT LEADCHNL LV IMPEDANCE VALUE: 551 Ohm
MDC IDC MSMT LEADCHNL LV IMPEDANCE VALUE: 950 Ohm
MDC IDC MSMT LEADCHNL LV PACING THRESHOLD AMPLITUDE: 0.75 V
MDC IDC MSMT LEADCHNL LV PACING THRESHOLD PULSEWIDTH: 0.4 ms
MDC IDC MSMT LEADCHNL RA PACING THRESHOLD PULSEWIDTH: 0.4 ms
MDC IDC MSMT LEADCHNL RA SENSING INTR AMPL: 1.375 mV
MDC IDC MSMT LEADCHNL RV IMPEDANCE VALUE: 418 Ohm
MDC IDC MSMT LEADCHNL RV IMPEDANCE VALUE: 589 Ohm
MDC IDC MSMT LEADCHNL RV PACING THRESHOLD AMPLITUDE: 0.875 V
MDC IDC SET LEADCHNL LV PACING AMPLITUDE: 1.75 V
MDC IDC SET LEADCHNL RA PACING AMPLITUDE: 2 V
MDC IDC SET LEADCHNL RV SENSING SENSITIVITY: 0.3 mV
MDC IDC STAT BRADY AP VP PERCENT: 2.07 %
MDC IDC STAT BRADY AP VS PERCENT: 0.06 %
MDC IDC STAT BRADY AS VP PERCENT: 96.28 %
MDC IDC STAT BRADY AS VS PERCENT: 1.59 %

## 2015-06-28 ENCOUNTER — Encounter: Payer: Self-pay | Admitting: Cardiology

## 2015-07-12 ENCOUNTER — Encounter: Payer: Self-pay | Admitting: Cardiology

## 2015-07-23 NOTE — Progress Notes (Signed)
Patient ID: [name], male   DOB: 04-Nov-1965, 50 y.o.   MRN: [id number]   [facility]  Patient ID: [name], male   DOB: 03/15/1966, 50 y.o.   MRN: [id number]  ---Medtronic Optivol ----   Primary Physician: [name], MD  Primary Cardiologist: Dr Ladona Ridgel  Primary HF: Dr. [name]    HPI: [name] is a 50 y.o. male with a history of HIV 1998, HCV, ETOH abuse and CHF due to NICM s/p Medtronic  CRT-D (06/2013). He also has a h/o syncope and previously had an EP study which was non-inducible for VT.    Was admitted 01/19/13- 01/22/13 for acute on chronic systolic HF. ECHO EF 20-25%. He was diuresed with IV lasix. He was discharged on coreg 6.25 mg BID, digoxin 0.25 mg daily, lisinopril 40 mg daily, and spironolactone 25 mg daily. Discharge weight 150 lbs. He stopped spironolactone due to severe headaches.  He was not able to tolerate titration of Coreg to 25 mg bid due to lightheadedness.   Follow up for Heart Failure: Echo after last visit showed EF now normal at 55%. Stopped hydralazine at last visit and increased Entresto, pt had been unable to tolerate imdur with headaches. Overall feeling good.  Denies SOB/Orthopnea/PND. No lightheadedness or dizziness. 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 Weight at home up slightly from previous.  Drinking more beer and not as active.  No ICD shocks. Asked about trying weight loss supplements. States he will occasionally take an extra lasix if feeling bloated or more SOB. Hasn't needed in several months.   ECHO 04/2013: EF 20-25%, mod/severe MR, LA mod dilated ECHO 10/16/13 EF 20-25%  ECHO 03/14/15 EF 55%  Labs (12/14): digoxin 0.5 Labs (2/15): K 4.2, creatinine 0.88 Labs (08/2013): K 4.2 Creatinine 1.02 Labs (10/15): K 4.0, creatinine 1.16 Labs (1/16): K 4.1, creatinine 1.28, AST 54, ALT 72 Labs (07/04/2014) : K 4.1 Creatinine 1.18   Optivol: Activity had gone down but now  approaching ~6 hours again. Fluid below threshold. Occasional trend consistent with history. No VT or VF alerts  SH: Lives with partner in [place], drinking a wine cooler twice a week (prior heavy ETOH).   ROS: All systems negative except as listed in HPI, PMH and Problem List.  Past Medical History  Diagnosis Date  . Hypertension   . HIV infection (HCC)   . Hepatitis     Hep B and Hep C (patient does not report this but these are listed in previous notes.)  . G6PD deficiency (HCC)   . Chronic systolic heart failure (HCC)     a. Echo 12/05/11:  EF 20-25%, diff HK with mild sparing of IL wall, mild AI, mod MR, mild LAE, mild RVE, mild reduced RVF.;  b.  Echo 05/11/2012:  Mild LVH, EF 20-25%, Gr 1 diast dysfn, mod MR, mild LAE  . NICM (nonischemic cardiomyopathy) (HCC)     cardia CTA 8/13 negative for obstructive CAD  . CHF (congestive heart failure) (HCC)   . GERD (gastroesophageal reflux disease)   . Depression     Current Outpatient Prescriptions  Medication Sig Dispense Refill  . amiodarone (PACERONE) 200 MG tablet Take 1 1/2 tablets by mouth Monday THRU Thursday 1 TABLET FRI,SAT,SUN 160 tablet 3  . carvedilol (COREG) 25 MG tablet Take 1 tablet (25 mg total) by mouth 2 (two) times daily with a meal. 60 tablet 3  . dolutegravir (TIVICAY) 50 MG  tablet Take 1 tablet (50 mg total) by mouth daily with lunch. 30 tablet 3  . Emtricitab-Rilpivir-Tenofov DF 200-25-300 MG TABS Take 1 tablet by mouth daily with lunch. 30 tablet 3  . furosemide (LASIX) 40 MG tablet Take 40 mg by mouth 2 (two) times daily.    . potassium chloride SA (K-DUR,KLOR-CON) 20 MEQ tablet Take 10 mEq by mouth 2 (two) times daily.    . sacubitril-valsartan (ENTRESTO) 49-51 MG Take 1 tablet by mouth 2 (two) times daily. 60 tablet 3  . spironolactone (ALDACTONE) 25 MG tablet Take 0.5 tablets (12.5 mg total) by mouth daily. 15 tablet 3  . traZODone (DESYREL) 100 MG tablet Take 1 tablet (100 mg total) by mouth at bedtime as  needed for sleep. 30 tablet 5  . valACYclovir (VALTREX) 1000 MG tablet Take 1 tablet (1,000 mg total) by mouth 2 (two) times daily as needed (outbreaks). Take for 5 days for an outbreak 10 tablet 0  . colchicine 0.6 MG tablet Take 1 tablet (0.6 mg total) by mouth 2 (two) times daily as needed. For gout flares. (Patient not taking: Reported on 07/24/2015) 30 tablet 2   No current facility-administered medications for this encounter.    Filed Vitals:   07/24/15 1105  BP: 138/90  Pulse: 64  Weight: 177 lb 12.8 oz (80.65 kg)  SpO2: 98%     Physical Exam:  General: Well appearing, NAD.  HEENT: normal.  Neck: supple. No JVD. Carotids 2+ bilat; no bruits. No thyromegaly or nodule noted Cor: PMI laterally displaced. RRR, 2/6 MR  Lungs: Clear, normal effort Abdomen: soft, NT, ND, no HSM. No bruits or masses. +BS  Extremities: no cyanosis, clubbing, rash, edema  Neuro: alert & oriented x 3, cranial nerves grossly intact. moves all 4 extremities w/o difficulty. Affect pleasant.  ASSESSMENT & PLAN:  1) Chronic systolic HF: Nonischemic cardiomyopathy, ?due to ETOH abuse. EF 55% (03/2015) s/p Medtronic CRT-D (06/2013).   - NYHA I-II symptoms. EF improved to normal on recent Echo. Volume status stable on exam and optivol.  - Will continue lasix 40 mg bid. Can take extra as needed for increasing fluid.  - Continue potassium 10 meq BID. Was listed as 20 BID. Will check BMET today.  - Continue coreg 25 mg BID, on goal dose. - Continue spironolactone 12.5 mg daily.  - Continue entresto 49-51 mg twice a day.  - Reinforced the need and importance of daily weights, a low sodium diet, and fluid restriction (less than 2 L a day). Instructed to call the HF clinic if weight increases more than 3 lbs overnight or 5 lbs in a week.  2) ETOH abuse:   - States he has increased his intake. Drinking several beers daily.  Encouraged to cut back with plans to stop completely.    3) Ventricular tachycardia:  -  Followed by Dr. Ladona Ridgel. Continue amiodarone per Dr Ladona Ridgel.    - Has next Dfib check 08/2015 4) Weight Loss -Discouraged him from trying any weight loss supplements - Encouraged him to watch his portions, increase his walking activity, and decrease his alcohol intake.  5) HTN - BP slightly up today but hasn't taken his medications this am.  - Stable on meds. With improved EF will not up-titrate medications.   Labs today as above. Follow up with MD in 6 months.   Graciella Freer PA-C

## 2015-07-24 ENCOUNTER — Ambulatory Visit (HOSPITAL_COMMUNITY)
Admission: RE | Admit: 2015-07-24 | Discharge: 2015-07-24 | Disposition: A | Discharge status: Home / Self Care | Payer: Medicare Other | Source: Ambulatory Visit | Attending: Cardiology | Admitting: Cardiology

## 2015-07-24 VITALS — BP 138/90 | HR 64 | Wt 177.8 lb

## 2015-07-24 DIAGNOSIS — Z95810 Presence of automatic (implantable) cardiac defibrillator: Secondary | ICD-10-CM | POA: Insufficient documentation

## 2015-07-24 DIAGNOSIS — I428 Other cardiomyopathies: Secondary | ICD-10-CM | POA: Diagnosis not present

## 2015-07-24 DIAGNOSIS — F329 Major depressive disorder, single episode, unspecified: Secondary | ICD-10-CM | POA: Diagnosis not present

## 2015-07-24 DIAGNOSIS — I110 Hypertensive heart disease with heart failure: Secondary | ICD-10-CM | POA: Diagnosis not present

## 2015-07-24 DIAGNOSIS — F1010 Alcohol abuse, uncomplicated: Secondary | ICD-10-CM | POA: Diagnosis not present

## 2015-07-24 DIAGNOSIS — I5022 Chronic systolic (congestive) heart failure: Secondary | ICD-10-CM

## 2015-07-24 DIAGNOSIS — K219 Gastro-esophageal reflux disease without esophagitis: Secondary | ICD-10-CM | POA: Insufficient documentation

## 2015-07-24 DIAGNOSIS — E7401 von Gierke disease: Secondary | ICD-10-CM | POA: Diagnosis not present

## 2015-07-24 DIAGNOSIS — B20 Human immunodeficiency virus [HIV] disease: Secondary | ICD-10-CM | POA: Insufficient documentation

## 2015-07-24 DIAGNOSIS — I472 Ventricular tachycardia: Secondary | ICD-10-CM | POA: Diagnosis not present

## 2015-07-24 DIAGNOSIS — Z79899 Other long term (current) drug therapy: Secondary | ICD-10-CM | POA: Diagnosis not present

## 2015-07-24 DIAGNOSIS — I10 Essential (primary) hypertension: Secondary | ICD-10-CM | POA: Diagnosis not present

## 2015-07-24 DIAGNOSIS — I4729 Other ventricular tachycardia: Secondary | ICD-10-CM

## 2015-07-24 LAB — BASIC METABOLIC PANEL
Anion gap: 11 (ref 5–15)
BUN: 7 mg/dL (ref 6–20)
CO2: 23 mmol/L (ref 22–32)
CREATININE: 1.03 mg/dL (ref 0.61–1.24)
Calcium: 8.9 mg/dL (ref 8.9–10.3)
Chloride: 106 mmol/L (ref 101–111)
GFR calc Af Amer: >60 mL/min (ref >60)
GLUCOSE: 96 mg/dL (ref 65–99)
POTASSIUM: 4.3 mmol/L (ref 3.5–5.1)
SODIUM: 140 mmol/L (ref 135–145)

## 2015-07-24 LAB — BRAIN NATRIURETIC PEPTIDE: B NATRIURETIC PEPTIDE 5: 49.2 pg/mL (ref 0.0–100.0)

## 2015-07-24 NOTE — Progress Notes (Signed)
Advanced Heart Failure Medication Review by a Pharmacist  Does the patient  feel that his/her medications are working for him/her?  yes  Has the patient been experiencing any side effects to the medications prescribed?  no  Does the patient measure his/her own blood pressure or blood glucose at home?  no   Does the patient have any problems obtaining medications due to transportation or finances?   no  Understanding of regimen: good Understanding of indications: good Potential of compliance: good Patient understands to avoid NSAIDs. Patient understands to avoid decongestants.  Issues to address at subsequent visits: None   Pharmacist comments:  [name] is a pleasant 50 yo M presenting without a medication list but with good recall of his regimen including dosages. He reports good compliance with his regimen. He did ask if there were any weight loss supplements he could try. We discussed the risks with most of the currently available weight loss products including tachycardia and hypertension and instead recommended trying a mediterranean diet. He did not have any other medication-related questions or concerns for me at this time.   Tyler Deis. Bonnye Fava, PharmD, BCPS, CPP Clinical Pharmacist Pager: [phone] Phone: [phone] 07/24/2015 11:19 AM      Time with patient: 10 minutes Preparation and documentation time: 2 minutes Total time: 12 minutes

## 2015-07-24 NOTE — Patient Instructions (Signed)
LABS TODAY  Your physician recommends that you schedule a follow-up appointment in: 6 MONTHS WITH DR [name]   Do the following things EVERYDAY: 1) Weigh yourself in the morning before breakfast. Write it down and keep it in a log. 2) Take your medicines as prescribed 3) Eat low salt foods-Limit salt (sodium) to 2000 mg per day.  4) Stay as active as you can everyday 5) Limit all fluids for the day to less than 2 liters 6)

## 2015-08-01 ENCOUNTER — Ambulatory Visit (INDEPENDENT_AMBULATORY_CARE_PROVIDER_SITE_OTHER): Payer: Medicare Other | Admitting: Internal Medicine

## 2015-08-01 ENCOUNTER — Encounter: Payer: Self-pay | Admitting: Internal Medicine

## 2015-08-01 VITALS — BP 142/96 | HR 73 | Temp 99.6°F | Wt 174.0 lb

## 2015-08-01 DIAGNOSIS — R69 Illness, unspecified: Principal | ICD-10-CM

## 2015-08-01 DIAGNOSIS — B20 Human immunodeficiency virus [HIV] disease: Secondary | ICD-10-CM

## 2015-08-01 DIAGNOSIS — J111 Influenza due to unidentified influenza virus with other respiratory manifestations: Secondary | ICD-10-CM | POA: Insufficient documentation

## 2015-08-01 MED ORDER — OSELTAMIVIR PHOSPHATE 75 MG PO CAPS: 75.0000 mg | ORAL_CAPSULE | Freq: Two times a day (BID) | ORAL | Status: DC

## 2015-08-01 NOTE — Assessment & Plan Note (Signed)
Still able to take his medications.

## 2015-08-01 NOTE — Assessment & Plan Note (Signed)
Will try empiric Tamiflu, c/w influenza-like illness.

## 2015-08-01 NOTE — Progress Notes (Signed)
   Subjective:    Patient ID: [name], male    DOB: [birth year], 50 y.o.   MRN: [id number]  HPI Here for a work in visit.  Has had 2-3 days of subjective fever, muscle aches, cough.  Started 2 days ago.  No known sick contacts.  Did have flu shot this year.  No rashes, no diarrhea.  No missed doses of HIV.    Review of Systems  Gastrointestinal: Negative for nausea, abdominal pain and diarrhea.  Skin: Negative for rash.  Neurological: Positive for headaches. Negative for dizziness.  Hematological: Negative for adenopathy.       Objective:   Physical Exam  Constitutional: He appears well-developed and well-nourished. No distress.  Eyes: No scleral icterus.  Cardiovascular: Normal rate, regular rhythm and normal heart sounds.   No murmur heard. Pulmonary/Chest: Effort normal and breath sounds normal. No respiratory distress. He has no wheezes.  Some upper airway congestion  Lymphadenopathy:    He has no cervical adenopathy.  Skin: No rash noted.          Assessment & Plan:

## 2015-09-09 ENCOUNTER — Ambulatory Visit (INDEPENDENT_AMBULATORY_CARE_PROVIDER_SITE_OTHER): Payer: Medicare Other | Admitting: *Deleted

## 2015-09-09 DIAGNOSIS — Z95810 Presence of automatic (implantable) cardiac defibrillator: Secondary | ICD-10-CM | POA: Diagnosis not present

## 2015-09-09 DIAGNOSIS — I429 Cardiomyopathy, unspecified: Secondary | ICD-10-CM | POA: Diagnosis not present

## 2015-09-09 DIAGNOSIS — I5022 Chronic systolic (congestive) heart failure: Secondary | ICD-10-CM | POA: Diagnosis not present

## 2015-09-09 NOTE — Progress Notes (Signed)
Remote ICD transmission.   

## 2015-09-10 ENCOUNTER — Other Ambulatory Visit: Payer: Self-pay | Admitting: *Deleted

## 2015-09-10 DIAGNOSIS — B20 Human immunodeficiency virus [HIV] disease: Secondary | ICD-10-CM

## 2015-09-10 MED ORDER — DOLUTEGRAVIR SODIUM 50 MG PO TABS: 50.0000 mg | ORAL_TABLET | Freq: Every day | ORAL | Status: DC

## 2015-09-10 MED ORDER — EMTRICITAB-RILPIVIR-TENOFOV DF 200-25-300 MG PO TABS: 1.0000 | ORAL_TABLET | Freq: Every day | ORAL | Status: DC

## 2015-09-11 ENCOUNTER — Other Ambulatory Visit: Payer: Self-pay | Admitting: *Deleted

## 2015-09-11 DIAGNOSIS — I472 Ventricular tachycardia: Secondary | ICD-10-CM

## 2015-09-11 DIAGNOSIS — I4729 Other ventricular tachycardia: Secondary | ICD-10-CM

## 2015-09-11 MED ORDER — AMIODARONE HCL 200 MG PO TABS: ORAL_TABLET | ORAL | Status: DC

## 2015-09-11 NOTE — Telephone Encounter (Signed)
amiodarone (PACERONE) 200 MG tablet  Medication   Date: 03/07/2015  Department: [facility] Office  Ordering/Authorizing: Marinus Maw, MD      Order Providers    Prescribing Provider Encounter Provider   Marinus Maw, MD Marinus Maw, MD    Medication Detail      Disp Refills Start End     amiodarone (PACERONE) 200 MG tablet 160 tablet 3 03/07/2015     Sig: Take 1 1/2 tablets by mouth Monday THRU Thursday  1 TABLET FRI,SAT,SUN    Class: Print     Associated Diagnoses    Ventricular tachycardia, polymorphic [facility])       Pharmacy    [id number] - [place], [place] - [address]   Had to resend due to being printing instead of eScrib

## 2015-10-01 LAB — CUP PACEART REMOTE DEVICE CHECK
Battery Remaining Longevity: 64 mo
Brady Statistic AP VS Percent: 0.03 %
Brady Statistic AS VS Percent: 1.58 %
HIGH POWER IMPEDANCE MEASURED VALUE: 81 Ohm
Implantable Lead Implant Date: 20150126
Implantable Lead Location: 753858
Implantable Lead Location: 753859
Implantable Lead Location: 753860
Implantable Lead Model: 6935
Lead Channel Impedance Value: 475 Ohm
Lead Channel Impedance Value: 513 Ohm
Lead Channel Pacing Threshold Amplitude: 0.75 V
Lead Channel Pacing Threshold Pulse Width: 0.4 ms
Lead Channel Sensing Intrinsic Amplitude: 1.625 mV
Lead Channel Sensing Intrinsic Amplitude: 15.5 mV
Lead Channel Sensing Intrinsic Amplitude: 15.5 mV
Lead Channel Setting Pacing Amplitude: 1.75 V
Lead Channel Setting Pacing Pulse Width: 0.4 ms
Lead Channel Setting Sensing Sensitivity: 0.3 mV
MDC IDC LEAD IMPLANT DT: 20150126
MDC IDC LEAD IMPLANT DT: 20150126
MDC IDC LEAD MODEL: 4396
MDC IDC MSMT BATTERY VOLTAGE: 2.98 V
MDC IDC MSMT LEADCHNL LV IMPEDANCE VALUE: 551 Ohm
MDC IDC MSMT LEADCHNL LV IMPEDANCE VALUE: 874 Ohm
MDC IDC MSMT LEADCHNL LV PACING THRESHOLD PULSEWIDTH: 0.4 ms
MDC IDC MSMT LEADCHNL RA IMPEDANCE VALUE: 418 Ohm
MDC IDC MSMT LEADCHNL RA PACING THRESHOLD AMPLITUDE: 0.75 V
MDC IDC MSMT LEADCHNL RA SENSING INTR AMPL: 1.625 mV
MDC IDC MSMT LEADCHNL RV IMPEDANCE VALUE: 418 Ohm
MDC IDC MSMT LEADCHNL RV PACING THRESHOLD AMPLITUDE: 1 V
MDC IDC MSMT LEADCHNL RV PACING THRESHOLD PULSEWIDTH: 0.4 ms
MDC IDC SESS DTM: 20170410071708
MDC IDC SET LEADCHNL RA PACING AMPLITUDE: 2 V
MDC IDC STAT BRADY AP VP PERCENT: 0.51 %
MDC IDC STAT BRADY AS VP PERCENT: 97.88 %
MDC IDC STAT BRADY RA PERCENT PACED: 0.54 %
MDC IDC STAT BRADY RV PERCENT PACED: 19.56 %

## 2015-10-07 ENCOUNTER — Other Ambulatory Visit (HOSPITAL_COMMUNITY): Payer: Self-pay | Admitting: *Deleted

## 2015-10-07 DIAGNOSIS — I5022 Chronic systolic (congestive) heart failure: Secondary | ICD-10-CM

## 2015-10-07 MED ORDER — SPIRONOLACTONE 25 MG PO TABS: 12.5000 mg | ORAL_TABLET | Freq: Every day | ORAL | Status: DC

## 2015-10-07 MED ORDER — CARVEDILOL 25 MG PO TABS: 25.0000 mg | ORAL_TABLET | Freq: Two times a day (BID) | ORAL | Status: DC

## 2015-10-09 ENCOUNTER — Other Ambulatory Visit: Payer: Medicare Other

## 2015-10-09 ENCOUNTER — Encounter: Payer: Self-pay | Admitting: Cardiology

## 2015-10-09 DIAGNOSIS — Z113 Encounter for screening for infections with a predominantly sexual mode of transmission: Secondary | ICD-10-CM

## 2015-10-09 DIAGNOSIS — B20 Human immunodeficiency virus [HIV] disease: Secondary | ICD-10-CM | POA: Diagnosis not present

## 2015-10-09 DIAGNOSIS — Z79899 Other long term (current) drug therapy: Secondary | ICD-10-CM

## 2015-10-09 LAB — CBC WITH DIFFERENTIAL/PLATELET
BASOS PCT: 1 %
Basophils Absolute: 63 cells/uL (ref 0–200)
EOS ABS: 126 {cells}/uL (ref 15–500)
EOS PCT: 2 %
HCT: 49 % (ref 38.5–50.0)
HEMOGLOBIN: 16.2 g/dL (ref 13.2–17.1)
LYMPHS ABS: 1323 {cells}/uL (ref 850–3900)
Lymphocytes Relative: 21 %
MCH: 29.6 pg (ref 27.0–33.0)
MCHC: 33.1 g/dL (ref 32.0–36.0)
MCV: 89.4 fL (ref 80.0–100.0)
MONOS PCT: 10 %
MPV: 10.4 fL (ref 7.5–12.5)
Monocytes Absolute: 630 cells/uL (ref 200–950)
NEUTROS ABS: 4158 {cells}/uL (ref 1500–7800)
Neutrophils Relative %: 66 %
PLATELETS: 230 10*3/uL (ref 140–400)
RBC: 5.48 MIL/uL (ref 4.20–5.80)
RDW: 14.4 % (ref 11.0–15.0)
WBC: 6.3 10*3/uL (ref 3.8–10.8)

## 2015-10-09 LAB — COMPLETE METABOLIC PANEL WITH GFR
ALBUMIN: 4.2 g/dL (ref 3.6–5.1)
ALT: 287 U/L — AB (ref 9–46)
AST: 319 U/L — ABNORMAL HIGH (ref 10–35)
Alkaline Phosphatase: 108 U/L (ref 40–115)
BILIRUBIN TOTAL: 0.9 mg/dL (ref 0.2–1.2)
BUN: 20 mg/dL (ref 7–25)
CALCIUM: 9.3 mg/dL (ref 8.6–10.3)
CO2: 27 mmol/L (ref 20–31)
Chloride: 104 mmol/L (ref 98–110)
Creat: 1.04 mg/dL (ref 0.70–1.33)
GFR, Est Non African American: 82 mL/min (ref >=60)
Glucose, Bld: 111 mg/dL — ABNORMAL HIGH (ref 65–99)
POTASSIUM: 4.1 mmol/L (ref 3.5–5.3)
Sodium: 142 mmol/L (ref 135–146)
TOTAL PROTEIN: 7.7 g/dL (ref 6.1–8.1)

## 2015-10-09 LAB — LIPID PANEL
CHOLESTEROL: 151 mg/dL (ref 125–200)
HDL: 58 mg/dL (ref >=40)
LDL Cholesterol: 64 mg/dL (ref <130)
TRIGLYCERIDES: 147 mg/dL (ref <150)
Total CHOL/HDL Ratio: 2.6 Ratio (ref <=5.0)
VLDL: 29 mg/dL (ref <30)

## 2015-10-10 LAB — T-HELPER CELL (CD4) - (RCID CLINIC ONLY)
CD4 % Helper T Cell: 21 % — ABNORMAL LOW (ref 33–55)
CD4 T Cell Abs: 340 /uL — ABNORMAL LOW (ref 400–2700)

## 2015-10-10 LAB — HIV-1 RNA QUANT-NO REFLEX-BLD: HIV-1 RNA Quant, Log: <1.3 Log copies/mL (ref <1.30)

## 2015-10-10 LAB — RPR

## 2015-10-22 ENCOUNTER — Ambulatory Visit (INDEPENDENT_AMBULATORY_CARE_PROVIDER_SITE_OTHER): Payer: Medicare Other | Admitting: Internal Medicine

## 2015-10-22 ENCOUNTER — Encounter: Payer: Self-pay | Admitting: Internal Medicine

## 2015-10-22 VITALS — BP 160/103 | HR 68 | Temp 97.8°F | Wt 173.0 lb

## 2015-10-22 DIAGNOSIS — I429 Cardiomyopathy, unspecified: Secondary | ICD-10-CM | POA: Diagnosis not present

## 2015-10-22 DIAGNOSIS — F1010 Alcohol abuse, uncomplicated: Secondary | ICD-10-CM

## 2015-10-22 DIAGNOSIS — B20 Human immunodeficiency virus [HIV] disease: Secondary | ICD-10-CM | POA: Diagnosis present

## 2015-10-22 NOTE — Assessment & Plan Note (Signed)
I worry that with his beer intake his EF will again decrease.  Patient aware.

## 2015-10-22 NOTE — Progress Notes (Signed)
Patient ID: [name], male   DOB: 09-12-1965, 50 y.o.   MRN: [id number]   Subjective:    Patient ID: [name], male    DOB: 06-19-1965, 50 y.o.   MRN: [id number]  HPI Here for follow up of his HIV.  He is on Complera and Tivicay.  He has been well controlled and CD4 340 with undetectable vl.  No issues with HIV.  No missed doses.     For his advanced CM, recent echo with an EF of 55%, but unfortunately he is back to drinking beer daily.  No liquor.   No ICD firing over 1 year.      Review of Systems  Constitutional: Negative for fever and fatigue.  HENT: Negative for trouble swallowing.   Respiratory: Negative for cough and shortness of breath.   Gastrointestinal: Negative for nausea.  Skin: Negative for rash.  Neurological: Negative for dizziness, light-headedness and headaches.       Objective:   Physical Exam  Constitutional: He appears well-developed and well-nourished. No distress.  HENT:  Mouth/Throat: No oropharyngeal exudate.  Eyes: No scleral icterus.  Cardiovascular: Normal rate, regular rhythm and normal heart sounds.   No murmur heard. Pulmonary/Chest: Effort normal and breath sounds normal. No respiratory distress. He has no wheezes.  Lymphadenopathy:    He has no cervical adenopathy.  Skin: No rash noted.   SHx: daily moderate beer intake       Assessment & Plan:

## 2015-10-22 NOTE — Assessment & Plan Note (Signed)
Doing well.  rtc 4 months.  

## 2015-10-22 NOTE — Assessment & Plan Note (Signed)
Unfortunately back to daily beer.  I advised him against this but he is insistent.

## 2015-10-23 ENCOUNTER — Encounter: Payer: Self-pay | Admitting: Cardiology

## 2015-11-11 ENCOUNTER — Other Ambulatory Visit (HOSPITAL_COMMUNITY): Payer: Self-pay | Admitting: *Deleted

## 2015-11-11 MED ORDER — SACUBITRIL-VALSARTAN 49-51 MG PO TABS: 1.0000 | ORAL_TABLET | Freq: Two times a day (BID) | ORAL | Status: DC

## 2015-12-05 ENCOUNTER — Other Ambulatory Visit: Payer: Self-pay | Admitting: Internal Medicine

## 2015-12-09 ENCOUNTER — Telehealth: Payer: Self-pay | Admitting: Cardiology

## 2015-12-09 ENCOUNTER — Ambulatory Visit (INDEPENDENT_AMBULATORY_CARE_PROVIDER_SITE_OTHER): Payer: Medicare Other | Admitting: *Deleted

## 2015-12-09 DIAGNOSIS — I5022 Chronic systolic (congestive) heart failure: Secondary | ICD-10-CM | POA: Diagnosis not present

## 2015-12-09 DIAGNOSIS — I429 Cardiomyopathy, unspecified: Secondary | ICD-10-CM

## 2015-12-09 DIAGNOSIS — Z95810 Presence of automatic (implantable) cardiac defibrillator: Secondary | ICD-10-CM | POA: Diagnosis not present

## 2015-12-09 NOTE — Telephone Encounter (Signed)
Spoke with pt and reminded pt of remote transmission that is due today. Pt verbalized understanding.   

## 2015-12-10 LAB — CUP PACEART REMOTE DEVICE CHECK
Battery Remaining Longevity: 63 mo
Battery Voltage: 2.97 V
Brady Statistic AP VP Percent: 0.49 %
Brady Statistic AP VS Percent: 0.03 %
Brady Statistic AS VS Percent: 1.53 %
HIGH POWER IMPEDANCE MEASURED VALUE: 86 Ohm
Implantable Lead Implant Date: 20150126
Implantable Lead Location: 753858
Implantable Lead Location: 753859
Implantable Lead Location: 753860
Implantable Lead Model: 4396
Implantable Lead Model: 5076
Lead Channel Impedance Value: 456 Ohm
Lead Channel Impedance Value: 532 Ohm
Lead Channel Impedance Value: 551 Ohm
Lead Channel Impedance Value: 931 Ohm
Lead Channel Pacing Threshold Amplitude: 0.75 V
Lead Channel Pacing Threshold Amplitude: 1.125 V
Lead Channel Pacing Threshold Pulse Width: 0.4 ms
Lead Channel Pacing Threshold Pulse Width: 0.4 ms
Lead Channel Sensing Intrinsic Amplitude: 17.375 mV
Lead Channel Setting Pacing Amplitude: 1.75 V
Lead Channel Setting Pacing Amplitude: 2 V
Lead Channel Setting Sensing Sensitivity: 0.3 mV
MDC IDC LEAD IMPLANT DT: 20150126
MDC IDC LEAD IMPLANT DT: 20150126
MDC IDC MSMT LEADCHNL LV IMPEDANCE VALUE: 551 Ohm
MDC IDC MSMT LEADCHNL RA IMPEDANCE VALUE: 456 Ohm
MDC IDC MSMT LEADCHNL RA PACING THRESHOLD AMPLITUDE: 0.5 V
MDC IDC MSMT LEADCHNL RA SENSING INTR AMPL: 1.375 mV
MDC IDC MSMT LEADCHNL RA SENSING INTR AMPL: 1.375 mV
MDC IDC MSMT LEADCHNL RV PACING THRESHOLD PULSEWIDTH: 0.4 ms
MDC IDC MSMT LEADCHNL RV SENSING INTR AMPL: 17.375 mV
MDC IDC SESS DTM: 20170711071833
MDC IDC SET LEADCHNL LV PACING PULSEWIDTH: 0.4 ms
MDC IDC STAT BRADY AS VP PERCENT: 97.95 %
MDC IDC STAT BRADY RA PERCENT PACED: 0.52 %
MDC IDC STAT BRADY RV PERCENT PACED: 5.45 %

## 2015-12-10 NOTE — Progress Notes (Signed)
Remote ICD transmission.   

## 2015-12-11 ENCOUNTER — Encounter: Payer: Self-pay | Admitting: Cardiology

## 2015-12-25 ENCOUNTER — Encounter: Payer: Self-pay | Admitting: Cardiology

## 2016-01-07 ENCOUNTER — Encounter (HOSPITAL_COMMUNITY): Payer: Self-pay | Admitting: Internal Medicine

## 2016-01-07 ENCOUNTER — Ambulatory Visit (HOSPITAL_COMMUNITY)
Admission: RE | Admit: 2016-01-07 | Discharge: 2016-01-07 | Disposition: A | Discharge status: Home / Self Care | Payer: Medicare Other | Source: Ambulatory Visit | Attending: Internal Medicine | Admitting: Internal Medicine

## 2016-01-07 VITALS — BP 132/80 | HR 70 | Wt 172.2 lb

## 2016-01-07 DIAGNOSIS — F1010 Alcohol abuse, uncomplicated: Secondary | ICD-10-CM | POA: Insufficient documentation

## 2016-01-07 DIAGNOSIS — Z79899 Other long term (current) drug therapy: Secondary | ICD-10-CM | POA: Diagnosis not present

## 2016-01-07 DIAGNOSIS — I4729 Other ventricular tachycardia: Secondary | ICD-10-CM

## 2016-01-07 DIAGNOSIS — I5022 Chronic systolic (congestive) heart failure: Secondary | ICD-10-CM | POA: Diagnosis not present

## 2016-01-07 DIAGNOSIS — I428 Other cardiomyopathies: Secondary | ICD-10-CM | POA: Diagnosis not present

## 2016-01-07 DIAGNOSIS — K219 Gastro-esophageal reflux disease without esophagitis: Secondary | ICD-10-CM | POA: Insufficient documentation

## 2016-01-07 DIAGNOSIS — Z95810 Presence of automatic (implantable) cardiac defibrillator: Secondary | ICD-10-CM | POA: Insufficient documentation

## 2016-01-07 DIAGNOSIS — I110 Hypertensive heart disease with heart failure: Secondary | ICD-10-CM | POA: Diagnosis not present

## 2016-01-07 DIAGNOSIS — I472 Ventricular tachycardia: Secondary | ICD-10-CM | POA: Diagnosis not present

## 2016-01-07 DIAGNOSIS — F329 Major depressive disorder, single episode, unspecified: Secondary | ICD-10-CM | POA: Insufficient documentation

## 2016-01-07 DIAGNOSIS — E7401 von Gierke disease: Secondary | ICD-10-CM | POA: Insufficient documentation

## 2016-01-07 DIAGNOSIS — B20 Human immunodeficiency virus [HIV] disease: Secondary | ICD-10-CM | POA: Diagnosis not present

## 2016-01-07 LAB — BASIC METABOLIC PANEL
ANION GAP: 6 (ref 5–15)
BUN: 9 mg/dL (ref 6–20)
CHLORIDE: 108 mmol/L (ref 101–111)
CO2: 24 mmol/L (ref 22–32)
Calcium: 8.9 mg/dL (ref 8.9–10.3)
Creatinine, Ser: 1.01 mg/dL (ref 0.61–1.24)
GFR calc Af Amer: >60 mL/min (ref >60)
Glucose, Bld: 88 mg/dL (ref 65–99)
POTASSIUM: 4 mmol/L (ref 3.5–5.1)
SODIUM: 138 mmol/L (ref 135–145)

## 2016-01-07 MED ORDER — SACUBITRIL-VALSARTAN 97-103 MG PO TABS: 1.0000 | ORAL_TABLET | Freq: Two times a day (BID) | ORAL | 6 refills | Status: DC

## 2016-01-07 MED ORDER — FUROSEMIDE 40 MG PO TABS: 40.0000 mg | ORAL_TABLET | Freq: Every day | ORAL | 3 refills | Status: DC

## 2016-01-07 NOTE — Progress Notes (Signed)
Patient ID: [name], male   DOB: 13-Jun-1965, 50 y.o.   MRN: [id number]   [facility]  Patient ID: [name], male   DOB: 02-01-1966, 50 y.o.   MRN: [id number]  ---Medtronic Optivol ----   Primary Physician: [name], MD  Primary Cardiologist: Dr Ladona Ridgel  Primary HF: Dr. [name]    HPI: [name] is a 50 y.o. male with a history of HIV 1998, HCV, ETOH abuse and CHF due to NICM s/p Medtronic  CRT-D (06/2013). He also has a h/o syncope and previously had an EP study which was non-inducible for VT.    Was admitted 01/19/13- 01/22/13 for acute on chronic systolic HF. ECHO EF 20-25%. He was diuresed with IV lasix. He was discharged on coreg 6.25 mg BID, digoxin 0.25 mg daily, lisinopril 40 mg daily, and spironolactone 25 mg daily. Discharge weight 150 lbs. He stopped spironolactone due to severe headaches.  He was not able to tolerate titration of Coreg to 25 mg bid due to lightheadedness.   Follow up for Heart Failure: Remains on Entresto 49/51. Stopped hydralazine because he has been unable to tolerate imdur with headaches. Overall feeling good.  Denies SOB/Orthopnea/PND. No lightheadedness or dizziness. 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 Weight stable.  Drinking 1-2 beers most days.  No ICD shocks.  Has not needed any extra lasix. BP ok.    ICD interrogation: Optivol ok. No VT/AF  ECHO 04/2013: EF 20-25%, mod/severe MR, LA mod dilated ECHO 10/16/13 EF 20-25%  ECHO 03/14/15 EF 55%  Labs (12/14): digoxin 0.5 Labs (2/15): K 4.2, creatinine 0.88 Labs (08/2013): K 4.2 Creatinine 1.02 Labs (10/15): K 4.0, creatinine 1.16 Labs (1/16): K 4.1, creatinine 1.28, AST 54, ALT 72 Labs (07/04/2014) : K 4.1 Creatinine 1.18   Optivol: Activity had gone down but now approaching ~6 hours again. Fluid below threshold. Occasional trend consistent with history. No VT or VF alerts  SH: Lives with partner in [place], drinking a wine  cooler twice a week (prior heavy ETOH).   ROS: All systems negative except as listed in HPI, PMH and Problem List.  Past Medical History:  Diagnosis Date  . CHF (congestive heart failure) (HCC)   . Chronic systolic heart failure (HCC)    a. Echo 12/05/11:  EF 20-25%, diff HK with mild sparing of IL wall, mild AI, mod MR, mild LAE, mild RVE, mild reduced RVF.;  b.  Echo 05/11/2012:  Mild LVH, EF 20-25%, Gr 1 diast dysfn, mod MR, mild LAE  . Depression   . G6PD deficiency (HCC)   . GERD (gastroesophageal reflux disease)   . Hepatitis    Hep B and Hep C (patient does not report this but these are listed in previous notes.)  . HIV infection (HCC)   . Hypertension   . NICM (nonischemic cardiomyopathy) (HCC)    cardia CTA 8/13 negative for obstructive CAD    Current Outpatient Prescriptions  Medication Sig Dispense Refill  . amiodarone (PACERONE) 200 MG tablet Take 1 1/2 tablets by mouth Monday THRU Thursday 1 TABLET FRI,SAT,SUN 160 tablet 1  . carvedilol (COREG) 25 MG tablet Take 1 tablet (25 mg total) by mouth 2 (two) times daily with a meal. 60 tablet 3  . dolutegravir (TIVICAY) 50 MG tablet Take 1 tablet (50 mg total) by mouth daily with lunch. 30 tablet 5  . emtricitabine-rilpivir-tenofovir DF (COMPLERA) 200-25-300 MG tablet Take 1 tablet by mouth daily  with lunch. 30 tablet 5  . furosemide (LASIX) 40 MG tablet Take 1 tablet (40 mg total) by mouth 2 (two) times daily. 180 tablet 3  . potassium chloride SA (K-DUR,KLOR-CON) 20 MEQ tablet Take 10 mEq by mouth 2 (two) times daily.    . sacubitril-valsartan (ENTRESTO) 49-51 MG Take 1 tablet by mouth 2 (two) times daily. 60 tablet 3  . spironolactone (ALDACTONE) 25 MG tablet Take 0.5 tablets (12.5 mg total) by mouth daily. 15 tablet 3  . traZODone (DESYREL) 100 MG tablet Take 1 tablet (100 mg total) by mouth at bedtime as needed for sleep. 30 tablet 5  . valACYclovir (VALTREX) 1000 MG tablet Take 1 tablet (1,000 mg total) by mouth 2 (two) times  daily as needed (outbreaks). Take for 5 days for an outbreak 10 tablet 0  . colchicine 0.6 MG tablet Take 1 tablet (0.6 mg total) by mouth 2 (two) times daily as needed. For gout flares. (Patient not taking: Reported on 01/07/2016) 30 tablet 2   No current facility-administered medications for this encounter.     Vitals:   01/07/16 1019  BP: 132/80  Pulse: 70  SpO2: 97%  Weight: 172 lb 4 oz (78.1 kg)     Physical Exam:  General: Well appearing, NAD.  HEENT: normal.  Neck: supple. No JVD. Carotids 2+ bilat; no bruits. No thyromegaly or nodule noted Cor: PMI laterally displaced. RRR, 2/6 MR  Lungs: Clear, normal effort Abdomen: soft, NT, ND, no HSM. No bruits or masses. +BS  Extremities: no cyanosis, clubbing, rash, edema  Neuro: alert & oriented x 3, cranial nerves grossly intact. moves all 4 extremities w/o difficulty. Affect pleasant.  ASSESSMENT & PLAN:  1) Chronic systolic HF: Nonischemic cardiomyopathy, ?due to ETOH abuse. EF 55% (03/2015) s/p Medtronic CRT-D (06/2013).   - NYHA I-II symptoms. EF improved to normal on recent Echo. Volume status stable on exam and optivol.  - Cut lasix back to 40 daily. Can take extra as needed.  - Continue potassium 10 meq BID.   - Continue coreg 25 mg BID, on goal dose. - Continue spironolactone 12.5 mg daily.  - Increase entresto 97-103 mg twice a day.  - Stressed need to avoid ETOH - Reinforced the need and importance of daily weights, a low sodium diet, and fluid restriction (less than 2 L a day). Instructed to call the HF clinic if weight increases more than 3 lbs overnight or 5 lbs in a week.  2) ETOH abuse:   - States he has increased his intake. Drinking several beers daily.  Encouraged to cut back with plans to stop completely.    3) Ventricular tachycardia:  - Followed by Dr. Ladona Ridgel. Continue amiodarone per Dr Ladona Ridgel.    - No VT on ICD interrogation 4) Weight Loss - Encouraged him to watch his portions, increase his walking  activity, and decrease his alcohol intake.  5) HTN - Stable on meds. We are increasing Entresto.     [name]

## 2016-01-07 NOTE — Patient Instructions (Signed)
Increase Entresto to 97/102 mg Twice daily   Decrease Furosemide (Lasix) to 40 mg daily  Lab today  We will contact you in 4 months to schedule your next appointment.

## 2016-01-07 NOTE — Addendum Note (Signed)
Encounter addended by: Noralee Space, RN on: 01/07/2016 11:05 AM<BR>    Actions taken: Order Entry activity accessed

## 2016-01-07 NOTE — Addendum Note (Signed)
Encounter addended by: Noralee Space, RN on: 01/07/2016 11:03 AM<BR>    Actions taken: Order Entry activity accessed, Diagnosis association updated, Sign clinical note

## 2016-02-11 ENCOUNTER — Ambulatory Visit (INDEPENDENT_AMBULATORY_CARE_PROVIDER_SITE_OTHER): Payer: Medicare Other

## 2016-02-11 ENCOUNTER — Other Ambulatory Visit: Payer: Medicare Other

## 2016-02-11 DIAGNOSIS — Z23 Encounter for immunization: Secondary | ICD-10-CM

## 2016-02-11 DIAGNOSIS — B20 Human immunodeficiency virus [HIV] disease: Secondary | ICD-10-CM | POA: Diagnosis not present

## 2016-02-12 LAB — HIV-1 RNA QUANT-NO REFLEX-BLD
HIV 1 RNA Quant: <20 copies/mL (ref <20)
HIV-1 RNA Quant, Log: <1.3 Log copies/mL (ref <1.30)

## 2016-02-12 LAB — T-HELPER CELL (CD4) - (RCID CLINIC ONLY)
CD4 T CELL ABS: 330 /uL — AB (ref 400–2700)
CD4 T CELL HELPER: 20 % — AB (ref 33–55)

## 2016-02-25 ENCOUNTER — Ambulatory Visit (INDEPENDENT_AMBULATORY_CARE_PROVIDER_SITE_OTHER): Payer: Medicare Other | Admitting: Internal Medicine

## 2016-02-25 ENCOUNTER — Encounter: Payer: Self-pay | Admitting: Internal Medicine

## 2016-02-25 VITALS — BP 145/99 | HR 67 | Temp 98.2°F | Wt 171.0 lb

## 2016-02-25 DIAGNOSIS — B182 Chronic viral hepatitis C: Secondary | ICD-10-CM

## 2016-02-25 DIAGNOSIS — B20 Human immunodeficiency virus [HIV] disease: Secondary | ICD-10-CM

## 2016-02-25 DIAGNOSIS — Z113 Encounter for screening for infections with a predominantly sexual mode of transmission: Secondary | ICD-10-CM

## 2016-02-25 DIAGNOSIS — F1010 Alcohol abuse, uncomplicated: Secondary | ICD-10-CM

## 2016-02-25 MED ORDER — EMTRICITAB-RILPIVIR-TENOFOV AF 200-25-25 MG PO TABS: 1.0000 | ORAL_TABLET | Freq: Every day | ORAL | 5 refills | Status: DC

## 2016-02-25 NOTE — Assessment & Plan Note (Signed)
I again counseled on avoiding alcohol.

## 2016-02-25 NOTE — Assessment & Plan Note (Signed)
He has active chronic HCV and needs treatment.  Unfortuntaley the medications interact with amiodarone.  I will discuss with Dr. [name] if that can be held during treatment but with the history of ICD firing, I am hesitant to consider it.  I will defer to their judgement.

## 2016-02-25 NOTE — Assessment & Plan Note (Signed)
Doing well on regimen.  rtc 4 months.

## 2016-02-25 NOTE — Progress Notes (Signed)
Patient ID: [name], male   DOB: April 17, 1966, 50 y.o.   MRN: [id number]   Subjective:    Patient ID: [name], male    DOB: 06/21/1965, 50 y.o.   MRN: [id number]  HPI Here for follow up of his HIV.   He is on Complera and Tivicay.  He has been well controlled and CD4 330 with undetectable vl.  No issues with HIV.  No missed doses.  Still with occasional beer.  Has fu with Dr. Ladona Ridgel in November, saw Dr. [name] in August and no issues, some reduction in lasix.  No missed doses.      Review of Systems  Constitutional: Negative for fatigue and fever.  HENT: Negative for trouble swallowing.   Respiratory: Negative for cough and shortness of breath.   Gastrointestinal: Negative for nausea.  Skin: Negative for rash.  Neurological: Negative for dizziness, light-headedness and headaches.       Objective:   Physical Exam  Constitutional: He appears well-developed and well-nourished. No distress.  HENT:  Mouth/Throat: No oropharyngeal exudate.  Eyes: No scleral icterus.  Cardiovascular: Normal rate, regular rhythm and normal heart sounds.   No murmur heard. Pulmonary/Chest: Effort normal and breath sounds normal. No respiratory distress. He has no wheezes.  Lymphadenopathy:    He has no cervical adenopathy.  Skin: No rash noted.   SHx: daily moderate beer intake       Assessment & Plan:

## 2016-02-27 DIAGNOSIS — Y999 Unspecified external cause status: Secondary | ICD-10-CM | POA: Diagnosis not present

## 2016-02-27 DIAGNOSIS — F10120 Alcohol abuse with intoxication, uncomplicated: Secondary | ICD-10-CM | POA: Insufficient documentation

## 2016-02-27 DIAGNOSIS — Y92002 Bathroom of unspecified non-institutional (private) residence single-family (private) house as the place of occurrence of the external cause: Secondary | ICD-10-CM | POA: Diagnosis not present

## 2016-02-27 DIAGNOSIS — W0110XA Fall on same level from slipping, tripping and stumbling with subsequent striking against unspecified object, initial encounter: Secondary | ICD-10-CM | POA: Insufficient documentation

## 2016-02-27 DIAGNOSIS — R51 Headache: Secondary | ICD-10-CM | POA: Insufficient documentation

## 2016-02-27 DIAGNOSIS — I5022 Chronic systolic (congestive) heart failure: Secondary | ICD-10-CM | POA: Insufficient documentation

## 2016-02-27 DIAGNOSIS — Z95810 Presence of automatic (implantable) cardiac defibrillator: Secondary | ICD-10-CM | POA: Insufficient documentation

## 2016-02-27 DIAGNOSIS — Y93E1 Activity, personal bathing and showering: Secondary | ICD-10-CM | POA: Insufficient documentation

## 2016-02-27 DIAGNOSIS — S0181XA Laceration without foreign body of other part of head, initial encounter: Secondary | ICD-10-CM | POA: Insufficient documentation

## 2016-02-27 DIAGNOSIS — Z23 Encounter for immunization: Secondary | ICD-10-CM | POA: Diagnosis not present

## 2016-02-27 DIAGNOSIS — I110 Hypertensive heart disease with heart failure: Secondary | ICD-10-CM | POA: Insufficient documentation

## 2016-02-27 DIAGNOSIS — S199XXA Unspecified injury of neck, initial encounter: Secondary | ICD-10-CM | POA: Diagnosis not present

## 2016-02-27 DIAGNOSIS — S0003XA Contusion of scalp, initial encounter: Secondary | ICD-10-CM | POA: Diagnosis not present

## 2016-02-28 ENCOUNTER — Emergency Department (HOSPITAL_COMMUNITY)
Admission: EM | Admit: 2016-02-28 | Discharge: 2016-02-28 | Disposition: A | Discharge status: Home / Self Care | Payer: Medicare Other | Attending: Emergency Medicine | Admitting: Emergency Medicine

## 2016-02-28 ENCOUNTER — Emergency Department (HOSPITAL_COMMUNITY): Payer: Medicare Other

## 2016-02-28 ENCOUNTER — Encounter (HOSPITAL_COMMUNITY): Payer: Self-pay | Admitting: Emergency Medicine

## 2016-02-28 DIAGNOSIS — S0181XA Laceration without foreign body of other part of head, initial encounter: Secondary | ICD-10-CM | POA: Diagnosis not present

## 2016-02-28 DIAGNOSIS — W19XXXA Unspecified fall, initial encounter: Secondary | ICD-10-CM

## 2016-02-28 DIAGNOSIS — S199XXA Unspecified injury of neck, initial encounter: Secondary | ICD-10-CM | POA: Diagnosis not present

## 2016-02-28 DIAGNOSIS — S0003XA Contusion of scalp, initial encounter: Secondary | ICD-10-CM | POA: Diagnosis not present

## 2016-02-28 DIAGNOSIS — F10920 Alcohol use, unspecified with intoxication, uncomplicated: Secondary | ICD-10-CM

## 2016-02-28 LAB — CBC WITH DIFFERENTIAL/PLATELET
BASOS PCT: 0 %
Basophils Absolute: 0 10*3/uL (ref 0.0–0.1)
Eosinophils Absolute: 0.2 10*3/uL (ref 0.0–0.7)
Eosinophils Relative: 2 %
HEMATOCRIT: 50.1 % (ref 39.0–52.0)
Hemoglobin: 16.8 g/dL (ref 13.0–17.0)
Lymphocytes Relative: 33 %
Lymphs Abs: 3.1 10*3/uL (ref 0.7–4.0)
MCH: 30.5 pg (ref 26.0–34.0)
MCHC: 33.5 g/dL (ref 30.0–36.0)
MCV: 90.9 fL (ref 78.0–100.0)
MONO ABS: 0.8 10*3/uL (ref 0.1–1.0)
MONOS PCT: 9 %
NEUTROS ABS: 5.3 10*3/uL (ref 1.7–7.7)
Neutrophils Relative %: 56 %
Platelets: 240 10*3/uL (ref 150–400)
RBC: 5.51 MIL/uL (ref 4.22–5.81)
RDW: 12.8 % (ref 11.5–15.5)
WBC: 9.4 10*3/uL (ref 4.0–10.5)

## 2016-02-28 LAB — ETHANOL: ALCOHOL ETHYL (B): 335 mg/dL — AB (ref <5)

## 2016-02-28 LAB — COMPREHENSIVE METABOLIC PANEL
ALBUMIN: 3.9 g/dL (ref 3.5–5.0)
ALT: 314 U/L — ABNORMAL HIGH (ref 17–63)
ANION GAP: 14 (ref 5–15)
AST: 404 U/L — ABNORMAL HIGH (ref 15–41)
Alkaline Phosphatase: 107 U/L (ref 38–126)
BILIRUBIN TOTAL: 0.9 mg/dL (ref 0.3–1.2)
BUN: 6 mg/dL (ref 6–20)
CO2: 18 mmol/L — ABNORMAL LOW (ref 22–32)
Calcium: 8.7 mg/dL — ABNORMAL LOW (ref 8.9–10.3)
Chloride: 99 mmol/L — ABNORMAL LOW (ref 101–111)
Creatinine, Ser: 0.82 mg/dL (ref 0.61–1.24)
Glucose, Bld: 111 mg/dL — ABNORMAL HIGH (ref 65–99)
POTASSIUM: 3.4 mmol/L — AB (ref 3.5–5.1)
Sodium: 131 mmol/L — ABNORMAL LOW (ref 135–145)
TOTAL PROTEIN: 8.2 g/dL — AB (ref 6.5–8.1)

## 2016-02-28 MED ORDER — TETANUS-DIPHTH-ACELL PERTUSSIS 5-2.5-18.5 LF-MCG/0.5 IM SUSP
0.5000 mL | Freq: Once | INTRAMUSCULAR | Status: AC
  Administered 2016-02-28: 0.5 mL via INTRAMUSCULAR
  Filled 2016-02-28: qty 0.5

## 2016-02-28 MED ORDER — LIDOCAINE-EPINEPHRINE (PF) 2 %-1:200000 IJ SOLN
20.0000 mL | Freq: Once | INTRAMUSCULAR | Status: AC
  Administered 2016-02-28: 20 mL
  Filled 2016-02-28: qty 20

## 2016-02-28 MED ORDER — LIDOCAINE-EPINEPHRINE-TETRACAINE (LET) SOLUTION
3.0000 mL | Freq: Once | NASAL | Status: AC
  Administered 2016-02-28: 3 mL via TOPICAL
  Filled 2016-02-28: qty 3

## 2016-02-28 MED ORDER — TRAMADOL HCL 50 MG PO TABS: 50.0000 mg | ORAL_TABLET | Freq: Four times a day (QID) | ORAL | 0 refills | Status: DC | PRN

## 2016-02-28 MED ORDER — NAPROXEN 500 MG PO TABS: 500.0000 mg | ORAL_TABLET | Freq: Two times a day (BID) | ORAL | 0 refills | Status: DC

## 2016-02-28 NOTE — ED Triage Notes (Signed)
Pt. slipped and fell in bathroom at home this evening , he is intoxicated with ETOH , presents with laceration at forehead approx. 2" ,pressure dressing applied.

## 2016-02-28 NOTE — ED Notes (Signed)
Family at bedside. 

## 2016-02-28 NOTE — ED Notes (Signed)
Patient transported to CT 

## 2016-02-28 NOTE — ED Provider Notes (Signed)
[facility]-EMERGENCY DEPT Provider Note   CSN: [id number] Arrival date & time: 02/27/16  2352  History   Chief Complaint Chief Complaint  Patient presents with  . Fall  . Alcohol Intoxication  . Laceration    HPI [name] is a 50 y.o. male.  HPI   Level V caveat- ETOH intoxication  Patient has PMH of CHF, chronic systolic, depression, G6PD deficiency, GERD, hepatitis, HIV, hypertension, NICM.  Patient to the ER after a slip and fall in the bathroom. He is intoxicated with alcohol. He says he was in the shower when he slipped and hit his head on an unknown object. He has sustained a laceration of 2 inches to his forehead. No loc, neck pain, vomiting or AMS.  Denies any back pain, loss of bowel or urine control. He is ambulatory.    Past Medical History:  Diagnosis Date  . CHF (congestive heart failure) (HCC)   . Chronic systolic heart failure (HCC)    a. Echo 12/05/11:  EF 20-25%, diff HK with mild sparing of IL wall, mild AI, mod MR, mild LAE, mild RVE, mild reduced RVF.;  b.  Echo 05/11/2012:  Mild LVH, EF 20-25%, Gr 1 diast dysfn, mod MR, mild LAE  . Depression   . G6PD deficiency (HCC)   . GERD (gastroesophageal reflux disease)   . Hepatitis    Hep B and Hep C (patient does not report this but these are listed in previous notes.)  . HIV infection (HCC)   . Hypertension   . NICM (nonischemic cardiomyopathy) (HCC)    cardia CTA 8/13 negative for obstructive CAD    Patient Active Problem List   Diagnosis Date Noted  . Influenza-like illness 08/01/2015  . Screening examination for venereal disease 03/21/2015  . Encounter for long-term (current) use of medications 03/21/2015  . Pain in joint, ankle and foot 03/21/2015  . Insomnia 06/26/2014  . Dehydration 02/22/2014  . AKI (acute kidney injury) (HCC) 02/22/2014  . Hypokalemia 02/04/2014  . Ventricular tachycardia, polymorphic (HCC) 02/03/2014  . Headache 02/03/2014  . PTSD (post-traumatic stress disorder)  02/03/2014  . Implantable cardioverter-defibrillator (ICD) discharge 01/27/2014  . Sustained VT (ventricular tachycardia) (HCC) 01/27/2014  . ICD (implantable cardioverter-defibrillator), biventricular, in situ 01/23/2014  . Paroxysmal ventricular fibrillation (HCC) 01/19/2014  . Gout attack 04/24/2013  . Protein-calorie malnutrition, severe (HCC) 02/13/2013  . GERD (gastroesophageal reflux disease) 08/19/2012  . Chronic systolic heart failure (HCC) 01/25/2012  . Wide-complex tachycardia (HCC) 01/07/2012  . Cardiomyopathy (HCC) 12/07/2011  . Alcohol abuse 11/30/2011  . Marijuana abuse 11/30/2011  . Human immunodeficiency virus (HIV) disease (HCC) 06/09/2006  . GENITAL HERPES 06/09/2006  . Chronic hepatitis C without hepatic coma (HCC) 06/09/2006  . Essential hypertension 06/09/2006    Past Surgical History:  Procedure Laterality Date  . BI-VENTRICULAR PACEMAKER INSERTION N/A 06/26/2013   Procedure: BI-VENTRICULAR PACEMAKER INSERTION (CRT-P);  Surgeon: Marinus Maw, MD;  Location: [facility] CATH LAB;  Service: Cardiovascular;  Laterality: N/A;  . ELECTROPHYSIOLOGY STUDY N/A 02/19/2012   Procedure: ELECTROPHYSIOLOGY STUDY;  Surgeon: Marinus Maw, MD;  Location: [facility] CATH LAB;  Service: Cardiovascular;  Laterality: N/A;  . ESOPHAGOGASTRODUODENOSCOPY  12/07/2011   Procedure: ESOPHAGOGASTRODUODENOSCOPY (EGD);  Surgeon: [name], MD,FACG;  Location: [facility] ENDOSCOPY;  Service: Endoscopy;  Laterality: N/A;  . FINGER SURGERY     Thumb laceration.    Marland Kitchen LEFT HEART CATHETERIZATION WITH CORONARY ANGIOGRAM N/A 01/31/2014   Procedure: LEFT HEART CATHETERIZATION WITH CORONARY ANGIOGRAM;  Surgeon: Iran Ouch,  MD;  Location: [facility] CATH LAB;  Service: Cardiovascular;  Laterality: N/A;      Home Medications    Prior to Admission medications   Medication Sig Start Date End Date Taking? Authorizing Provider  amiodarone (PACERONE) 200 MG tablet Take 1 1/2 tablets by mouth Monday THRU Thursday 1 TABLET  FRI,SAT,SUN 09/11/15   Marinus Maw, MD  carvedilol (COREG) 25 MG tablet Take 1 tablet (25 mg total) by mouth 2 (two) times daily with a meal. 10/07/15   Dolores Patty, MD  colchicine 0.6 MG tablet Take 1 tablet (0.6 mg total) by mouth 2 (two) times daily as needed. For gout flares. 12/25/14   [name], MD  dolutegravir (TIVICAY) 50 MG tablet Take 1 tablet (50 mg total) by mouth daily with lunch. 09/10/15   [name], MD  emtricitabine-rilpivir-tenofovir AF (ODEFSEY) 200-25-25 MG TABS tablet Take 1 tablet by mouth daily with breakfast. 02/25/16   [name], MD  furosemide (LASIX) 40 MG tablet Take 1 tablet (40 mg total) by mouth daily. 01/07/16   Dolores Patty, MD  naproxen (NAPROSYN) 500 MG tablet Take 1 tablet (500 mg total) by mouth 2 (two) times daily. 02/28/16   Livian Vanderbeck Neva Seat, PA-C  potassium chloride SA (K-DUR,KLOR-CON) 20 MEQ tablet Take 10 mEq by mouth 2 (two) times daily.    Historical Provider, MD  sacubitril-valsartan (ENTRESTO) 97-103 MG Take 1 tablet by mouth 2 (two) times daily. 01/07/16   Dolores Patty, MD  spironolactone (ALDACTONE) 25 MG tablet Take 0.5 tablets (12.5 mg total) by mouth daily. 10/07/15   Dolores Patty, MD  traZODone (DESYREL) 100 MG tablet Take 1 tablet (100 mg total) by mouth at bedtime as needed for sleep. 06/26/14   [name], MD  valACYclovir (VALTREX) 1000 MG tablet Take 1 tablet (1,000 mg total) by mouth 2 (two) times daily as needed (outbreaks). Take for 5 days for an outbreak 02/22/14   Dolores Patty, MD    Family History Family History  Problem Relation Age of Onset  . Lung cancer Mother   . Colon cancer  50    Social History Social History  Substance Use Topics  . Smoking status: Never Smoker  . Smokeless tobacco: Never Used  . Alcohol use 0.6 oz/week    1 Cans of beer per week     Comment: wine cooler     Allergies   Bactrim   Review of Systems Review of Systems  Level V caveat- ETOH  intoxication  Physical Exam Updated Vital Signs BP 120/92 (BP Location: Right Arm)   Pulse 69   Temp 98 F (36.7 C) (Oral)   Resp 18   Wt 78 kg   SpO2 97%   BMI 29.07 kg/m   Physical Exam  Constitutional: He is oriented to person, place, and time. He appears well-developed and well-nourished. No distress.  HENT:  Head: Normocephalic. Head is with contusion and with laceration. Head is without raccoon's eyes and without Battle's sign.    Eyes: Pupils are equal, round, and reactive to light.  Neck: Normal range of motion. Neck supple. No spinous process tenderness and no muscular tenderness present.  Cardiovascular: Normal rate and regular rhythm.   Pulmonary/Chest: Effort normal and breath sounds normal.  Abdominal: Soft. There is no tenderness.  Neurological: He is alert and oriented to person, place, and time. He has normal strength. No cranial nerve deficit or sensory deficit. He displays a negative Romberg sign.  Skin: Skin is warm and dry.  Psychiatric:  Pt is intoxicated  Nursing note and vitals reviewed.   ED Treatments / Results  Labs (all labs ordered are listed, but only abnormal results are displayed) Labs Reviewed  ETHANOL - Abnormal; Notable for the following:       Result Value   Alcohol, Ethyl (B) 335 (*)    All other components within normal limits  COMPREHENSIVE METABOLIC PANEL - Abnormal; Notable for the following:    Sodium 131 (*)    Potassium 3.4 (*)    Chloride 99 (*)    CO2 18 (*)    Glucose, Bld 111 (*)    Calcium 8.7 (*)    Total Protein 8.2 (*)    AST 404 (*)    ALT 314 (*)    All other components within normal limits  CBC WITH DIFFERENTIAL/PLATELET    EKG  EKG Interpretation None       Radiology Ct Head Wo Contrast  Result Date: 02/28/2016 CLINICAL DATA:  Laceration after fall. Alcohol intake. History of HIV, hypertension, pacemaker. EXAM: CT HEAD WITHOUT CONTRAST CT CERVICAL SPINE WITHOUT CONTRAST TECHNIQUE: Multidetector CT  imaging of the head and cervical spine was performed following the standard protocol without intravenous contrast. Multiplanar CT image reconstructions of the cervical spine were also generated. COMPARISON:  CT HEAD July 15, 2012 FINDINGS: CT HEAD FINDINGS BRAIN: The ventricles and sulci are normal. No intraparenchymal hemorrhage, mass effect nor midline shift. No acute large vascular territory infarcts. No abnormal extra-axial fluid collections. Basal cisterns are patent. VASCULAR: Unremarkable. SKULL/SOFT TISSUES: No skull fracture. Small RIGHT frontal scalp hematoma and laceration without subcutaneous gas or radiopaque foreign bodies. ORBITS/SINUSES: The included ocular globes and orbital contents are normal.Minimal LEFT sphenoid sinus mucosal thickening/air-fluid level. Partially imaged RIGHT maxillary mucosal retention cyst. Mastoid air cells are well aerated. OTHER: None. CT CERVICAL SPINE FINDINGS ALIGNMENT: Vertebral bodies in alignment.  Straightened lordosis. SKULL BASE AND VERTEBRAE: Cervical vertebral bodies and posterior elements are intact. Moderate to severe C5-6 disc height loss with vacuum disc, of sub chondral cyst formation, uncovertebral hypertrophy and endplate spurring compatible with degenerative disc. Mild C4-5 degenerative disc. No destructive bony lesions. C1-2 articulation maintained. SOFT TISSUES AND SPINAL CANAL: No canal stenosis. Mild C5-6 neural foraminal narrowing. DISC LEVELS: No significant osseous canal stenosis or neural foraminal narrowing. UPPER CHEST: Lung apices are clear. OTHER: None. IMPRESSION: CT HEAD: Small RIGHT frontal scalp hematoma and laceration. No skull fracture. No acute intracranial process; negative CT HEAD. CT CERVICAL SPINE: No acute fracture or malalignment. Electronically Signed   By: Awilda Metro M.D.   On: 02/28/2016 04:08   Ct Cervical Spine Wo Contrast  Result Date: 02/28/2016 CLINICAL DATA:  Laceration after fall. Alcohol intake. History  of HIV, hypertension, pacemaker. EXAM: CT HEAD WITHOUT CONTRAST CT CERVICAL SPINE WITHOUT CONTRAST TECHNIQUE: Multidetector CT imaging of the head and cervical spine was performed following the standard protocol without intravenous contrast. Multiplanar CT image reconstructions of the cervical spine were also generated. COMPARISON:  CT HEAD July 15, 2012 FINDINGS: CT HEAD FINDINGS BRAIN: The ventricles and sulci are normal. No intraparenchymal hemorrhage, mass effect nor midline shift. No acute large vascular territory infarcts. No abnormal extra-axial fluid collections. Basal cisterns are patent. VASCULAR: Unremarkable. SKULL/SOFT TISSUES: No skull fracture. Small RIGHT frontal scalp hematoma and laceration without subcutaneous gas or radiopaque foreign bodies. ORBITS/SINUSES: The included ocular globes and orbital contents are normal.Minimal LEFT sphenoid sinus mucosal thickening/air-fluid level. Partially imaged RIGHT maxillary  mucosal retention cyst. Mastoid air cells are well aerated. OTHER: None. CT CERVICAL SPINE FINDINGS ALIGNMENT: Vertebral bodies in alignment.  Straightened lordosis. SKULL BASE AND VERTEBRAE: Cervical vertebral bodies and posterior elements are intact. Moderate to severe C5-6 disc height loss with vacuum disc, of sub chondral cyst formation, uncovertebral hypertrophy and endplate spurring compatible with degenerative disc. Mild C4-5 degenerative disc. No destructive bony lesions. C1-2 articulation maintained. SOFT TISSUES AND SPINAL CANAL: No canal stenosis. Mild C5-6 neural foraminal narrowing. DISC LEVELS: No significant osseous canal stenosis or neural foraminal narrowing. UPPER CHEST: Lung apices are clear. OTHER: None. IMPRESSION: CT HEAD: Small RIGHT frontal scalp hematoma and laceration. No skull fracture. No acute intracranial process; negative CT HEAD. CT CERVICAL SPINE: No acute fracture or malalignment. Electronically Signed   By: Awilda Metroourtnay  Bloomer M.D.   On: 02/28/2016  04:08    Procedures Procedures (including critical care time)  Medications Ordered in ED Medications  lidocaine-EPINEPHrine-tetracaine (LET) solution (3 mLs Topical Given 02/28/16 0408)  lidocaine-EPINEPHrine (XYLOCAINE W/EPI) 2 %-1:200000 (PF) injection 20 mL (20 mLs Other Given 02/28/16 0516)  Tdap (BOOSTRIX) injection 0.5 mL (0.5 mLs Intramuscular Given 02/28/16 0529)     Initial Impression / Assessment and Plan / ED Course  I have reviewed the triage vital signs and the nursing notes.  Pertinent labs & imaging results that were available during my care of the patient were reviewed by me and considered in my medical decision making (see chart for details).  Clinical Course    LACERATION REPAIR Performed by: Dorthula MatasGREENE,Adelai Achey G Authorized by: Dorthula MatasGREENE,Edgel Degnan G Consent: Verbal consent obtained. Risks and benefits: risks, benefits and alternatives were discussed Consent given by: patient Patient identity confirmed: provided demographic data Prepped and Draped in normal sterile fashion Wound explored  Laceration Location: forehead  Laceration Length: 3cm  No Foreign Bodies seen or palpated  Anesthesia: topical LET  Anesthetic total: 3 ml  Irrigation method: syringe Amount of cleaning: standard  Skin closure: sutures  Number of sutures: 2  Technique: subcutaneous   Patient tolerance: Patient tolerated the procedure well with no immediate complications. Tetanus updated in ED today  . Laceration occurred < 12 hours prior to repair. Discussed laceration care with pt and answered questions. Pt to f-u but will not require suture removal.   Pt is hemodynamically stable with no complaints prior to dc.     Final Clinical Impressions(s) / ED Diagnoses   Final diagnoses:  Facial laceration, initial encounter  Alcohol intoxication, uncomplicated (HCC)  Fall, initial encounter    New Prescriptions New Prescriptions   NAPROXEN (NAPROSYN) 500 MG TABLET    Take 1 tablet  (500 mg total) by mouth 2 (two) times daily.     Marlon Peliffany Jameis Newsham, PA-C 02/28/16 0526    Marlon Peliffany Kyaira Trantham, PA-C 02/28/16 [id number]    Arby BarretteMarcy Pfeiffer, MD 03/05/16 [phone]

## 2016-02-28 NOTE — ED Notes (Signed)
ETOH 335

## 2016-03-24 ENCOUNTER — Telehealth: Payer: Self-pay | Admitting: *Deleted

## 2016-03-24 NOTE — Telephone Encounter (Signed)
Patient called c/o frequent headaches since falling and hitting his head three weeks ago. Advised him to be checked out at Urgent care or ED as we do not have available appts today. He said he feels ok; however he wakes up with a severe headache every morning. He said he will go to ED to have this checked out.

## 2016-04-15 ENCOUNTER — Encounter: Payer: Self-pay | Admitting: Internal Medicine

## 2016-04-15 ENCOUNTER — Ambulatory Visit (INDEPENDENT_AMBULATORY_CARE_PROVIDER_SITE_OTHER): Payer: Medicare Other | Admitting: Internal Medicine

## 2016-04-15 VITALS — BP 150/96 | HR 74 | Ht 64.5 in | Wt 173.0 lb

## 2016-04-15 DIAGNOSIS — I5022 Chronic systolic (congestive) heart failure: Secondary | ICD-10-CM

## 2016-04-15 DIAGNOSIS — I519 Heart disease, unspecified: Secondary | ICD-10-CM

## 2016-04-15 DIAGNOSIS — Z95810 Presence of automatic (implantable) cardiac defibrillator: Secondary | ICD-10-CM | POA: Diagnosis not present

## 2016-04-15 LAB — CUP PACEART INCLINIC DEVICE CHECK
Battery Voltage: 2.97 V
Brady Statistic AS VP Percent: 97.56 %
Brady Statistic RA Percent Paced: 0.87 %
Date Time Interrogation Session: 20171115150813
HIGH POWER IMPEDANCE MEASURED VALUE: 89 Ohm
Implantable Lead Implant Date: 20150126
Implantable Lead Implant Date: 20150126
Implantable Lead Location: 753858
Implantable Lead Location: 753859
Implantable Lead Model: 6935
Implantable Pulse Generator Implant Date: 20150126
Lead Channel Impedance Value: 418 Ohm
Lead Channel Impedance Value: 418 Ohm
Lead Channel Impedance Value: 475 Ohm
Lead Channel Pacing Threshold Amplitude: 0.75 V
Lead Channel Pacing Threshold Amplitude: 1 V
Lead Channel Pacing Threshold Pulse Width: 0.4 ms
Lead Channel Pacing Threshold Pulse Width: 0.4 ms
Lead Channel Sensing Intrinsic Amplitude: 13.625 mV
Lead Channel Sensing Intrinsic Amplitude: 16 mV
Lead Channel Setting Pacing Amplitude: 1.75 V
Lead Channel Setting Pacing Amplitude: 2.5 V
Lead Channel Setting Pacing Pulse Width: 0.4 ms
Lead Channel Setting Sensing Sensitivity: 0.3 mV
MDC IDC LEAD IMPLANT DT: 20150126
MDC IDC LEAD LOCATION: 753860
MDC IDC LEAD MODEL: 4396
MDC IDC MSMT BATTERY REMAINING LONGEVITY: 58 mo
MDC IDC MSMT LEADCHNL LV IMPEDANCE VALUE: 532 Ohm
MDC IDC MSMT LEADCHNL LV IMPEDANCE VALUE: 874 Ohm
MDC IDC MSMT LEADCHNL LV PACING THRESHOLD AMPLITUDE: 0.75 V
MDC IDC MSMT LEADCHNL RA PACING THRESHOLD PULSEWIDTH: 0.4 ms
MDC IDC MSMT LEADCHNL RA SENSING INTR AMPL: 1.25 mV
MDC IDC MSMT LEADCHNL RA SENSING INTR AMPL: 1.25 mV
MDC IDC MSMT LEADCHNL RV IMPEDANCE VALUE: 532 Ohm
MDC IDC SET LEADCHNL RA PACING AMPLITUDE: 2 V
MDC IDC SET LEADCHNL RV PACING PULSEWIDTH: 0.4 ms
MDC IDC STAT BRADY AP VP PERCENT: 0.84 %
MDC IDC STAT BRADY AP VS PERCENT: 0.03 %
MDC IDC STAT BRADY AS VS PERCENT: 1.56 %
MDC IDC STAT BRADY RV PERCENT PACED: 13.38 %

## 2016-04-15 MED ORDER — AMIODARONE HCL 200 MG PO TABS: 200.0000 mg | ORAL_TABLET | Freq: Every day | ORAL | 11 refills | Status: DC

## 2016-04-15 NOTE — Progress Notes (Signed)
HPI [name] returns today for followup. He has a remote h/o VT storm and a longstanding non-ischemic CM. No CAD at cath. He was placed on amiodarone. In the interim, he has stopped smoking, THC and stopped drinking (except for an occaisional beer). He has had no ICD shocks. He has had no chest pain or shortness of breath. No syncope. No peripheral edema. He has tolerated amiodarone. His LFT's have been elevated but he has no clinical evidence of hepatitis. He admits to dietary indiscretion.   Allergies  Allergen Reactions  . Bactrim Rash     Current Outpatient Prescriptions  Medication Sig Dispense Refill  . amiodarone (PACERONE) 200 MG tablet Take 1 1/2 tablets by mouth Monday THRU Thursday 1 TABLET FRI,SAT,SUN 160 tablet 1  . carvedilol (COREG) 25 MG tablet Take 1 tablet (25 mg total) by mouth 2 (two) times daily with a meal. 60 tablet 3  . colchicine 0.6 MG tablet Take 1 tablet (0.6 mg total) by mouth 2 (two) times daily as needed. For gout flares. 30 tablet 2  . dolutegravir (TIVICAY) 50 MG tablet Take 1 tablet (50 mg total) by mouth daily with lunch. 30 tablet 5  . emtricitabine-rilpivir-tenofovir AF (ODEFSEY) 200-25-25 MG TABS tablet Take 1 tablet by mouth daily with breakfast. 30 tablet 5  . naproxen (NAPROSYN) 500 MG tablet Take 1 tablet (500 mg total) by mouth 2 (two) times daily. 30 tablet 0  . potassium chloride SA (K-DUR,KLOR-CON) 20 MEQ tablet Take 10 mEq by mouth 2 (two) times daily.    . sacubitril-valsartan (ENTRESTO) 97-103 MG Take 1 tablet by mouth 2 (two) times daily. 60 tablet 6  . spironolactone (ALDACTONE) 25 MG tablet Take 0.5 tablets (12.5 mg total) by mouth daily. 15 tablet 3  . traZODone (DESYREL) 100 MG tablet Take 1 tablet (100 mg total) by mouth at bedtime as needed for sleep. 30 tablet 5  . valACYclovir (VALTREX) 1000 MG tablet Take 1 tablet (1,000 mg total) by mouth 2 (two) times daily as needed (outbreaks). Take for 5 days for an outbreak 10 tablet 0    No current facility-administered medications for this visit.      Past Medical History:  Diagnosis Date  . CHF (congestive heart failure) (HCC)   . Chronic systolic heart failure (HCC)    a. Echo 12/05/11:  EF 20-25%, diff HK with mild sparing of IL wall, mild AI, mod MR, mild LAE, mild RVE, mild reduced RVF.;  b.  Echo 05/11/2012:  Mild LVH, EF 20-25%, Gr 1 diast dysfn, mod MR, mild LAE  . Depression   . G6PD deficiency (HCC)   . GERD (gastroesophageal reflux disease)   . Hepatitis    Hep B and Hep C (patient does not report this but these are listed in previous notes.)  . HIV infection (HCC)   . Hypertension   . NICM (nonischemic cardiomyopathy) (HCC)    cardia CTA 8/13 negative for obstructive CAD    ROS:   All systems reviewed and negative except as noted in the HPI.   Past Surgical History:  Procedure Laterality Date  . BI-VENTRICULAR PACEMAKER INSERTION N/A 06/26/2013   Procedure: BI-VENTRICULAR PACEMAKER INSERTION (CRT-P);  Surgeon: Marinus Maw, MD;  Location: [facility] CATH LAB;  Service: Cardiovascular;  Laterality: N/A;  . ELECTROPHYSIOLOGY STUDY N/A 02/19/2012   Procedure: ELECTROPHYSIOLOGY STUDY;  Surgeon: Marinus Maw, MD;  Location: [facility] CATH LAB;  Service: Cardiovascular;  Laterality: N/A;  . ESOPHAGOGASTRODUODENOSCOPY  12/07/2011  Procedure: ESOPHAGOGASTRODUODENOSCOPY (EGD);  Surgeon: Meryl DareMalcolm T Stark, MD,FACG;  [facility];  Service: Endoscopy;  Laterality: N/A;  . FINGER SURGERY     Thumb laceration.    [name] Kitchen. LEFT HEART CATHETERIZATION WITH CORONARY ANGIOGRAM N/A 01/31/2014   Procedure: LEFT HEART CATHETERIZATION WITH CORONARY ANGIOGRAM;  Surgeon: Iran OuchMuhammad A Arida, MD;  Location: [facility] CATH LAB;  Service: Cardiovascular;  Laterality: N/A;     Family History  Problem Relation Age of Onset  . Lung cancer Mother   . Colon cancer  50     Social History   Social History  . Marital status: Single    Spouse name: N/A  . Number of children: N/A  . Years of  education: N/A   Occupational History  . Unemployed    Social History Main Topics  . Smoking status: Never Smoker  . Smokeless tobacco: Never Used  . Alcohol use 0.6 oz/week    1 Cans of beer per week     Comment: wine cooler  . Drug use:     Frequency: 7.0 times per week    Types: Marijuana     Comment: marijuana daily  . Sexual activity: Yes    Partners: Male     Comment: 28 year relationship, givencondoms   Other Topics Concern  . Not on file   Social History Narrative   Lives alone.  Drinks beer daily.  Liquor rarely.  He occasionally smokes marijuana..     BP (!) 150/96   Pulse 74   Ht 5' 4.5" (1.638 m)   Wt 173 lb (78.5 kg)   BMI 29.24 kg/m   Physical Exam:  Well appearing middle aged man, NAD HEENT: Unremarkable Neck:  6 cm JVD, no thyromegally Back:  No CVA tenderness Lungs:  Clear with no wheezes HEART:  Regular rate rhythm, no murmurs, no rubs, no clicks Abd:  soft, positive bowel sounds, no organomegally, no rebound, no guarding Ext:  2 plus pulses, no edema, no cyanosis, no clubbing Skin:  No rashes no nodules Neuro:  CN II through XII intact, motor grossly intact  ECG - NSR with biv pacing  DEVICE  Normal device function.  See PaceArt for details.   Assess/Plan: 1. VT - he has had none in the last year. We will continue to down titrate his amiodarone to 200 mg daily.  2. Chronic systolic heart failure - his symptoms are stable. We will ask him to repeat his 2 D echo to follow the progression of LV function 3. Hepatitis - this could be related to amiodarone. Will ask him to reduce his dose. 4. ICD - his medtronic BiV ICD is working normally. Will follow.  Leonia ReevesGregg Taylor,M.D.

## 2016-04-15 NOTE — Patient Instructions (Addendum)
Medication Instructions:  Your physician has recommended you make the following change in your medication:  1) Take Amiodarone 1 tablet (200 mg) daily    Labwork: None Ordered   Testing/Procedures: Your physician has requested that you have an echocardiogram. Echocardiography is a painless test that uses sound waves to create images of your heart. It provides your doctor with information about the size and shape of your heart and how well your heart's chambers and valves are working. This procedure takes approximately one hour. There are no restrictions for this procedure.     Follow-Up: Your physician wants you to follow-up in: 1 year with Dr. Ladona Ridgel.  You will receive a reminder letter in the mail two months in advance. If you don't receive a letter, please call our office to schedule the follow-up appointment.  Remote monitoring is used to monitor your ICD from home. This monitoring reduces the number of office visits required to check your device to one time per year. It allows [place] to keep an eye on the functioning of your device to ensure it is working properly. You are scheduled for a device check from home on 07/15/16. You may send your transmission at any time that day. If you have a wireless device, the transmission will be sent automatically. After your physician reviews your transmission, you will receive a postcard with your next transmission date.    Any Other Special Instructions Will Be Listed Below (If Applicable).     If you need a refill on your cardiac medications before your next appointment, please call your pharmacy.

## 2016-04-27 ENCOUNTER — Telehealth: Payer: Self-pay | Admitting: *Deleted

## 2016-04-27 ENCOUNTER — Other Ambulatory Visit: Payer: Self-pay | Admitting: *Deleted

## 2016-04-27 MED ORDER — EMTRICITAB-RILPIVIR-TENOFOV AF 200-25-25 MG PO TABS: 1.0000 | ORAL_TABLET | Freq: Every day | ORAL | 5 refills | Status: DC

## 2016-04-27 NOTE — Telephone Encounter (Signed)
Patient called requesting a refill on Odefsey. Rx sent to [facility] and patient notified. Wendall Mola

## 2016-05-08 ENCOUNTER — Ambulatory Visit (HOSPITAL_COMMUNITY): Payer: Medicare Other | Attending: Internal Medicine

## 2016-05-08 ENCOUNTER — Other Ambulatory Visit: Payer: Self-pay

## 2016-05-08 DIAGNOSIS — I340 Nonrheumatic mitral (valve) insufficiency: Secondary | ICD-10-CM | POA: Insufficient documentation

## 2016-05-08 DIAGNOSIS — F1210 Cannabis abuse, uncomplicated: Secondary | ICD-10-CM | POA: Diagnosis not present

## 2016-05-08 DIAGNOSIS — I5022 Chronic systolic (congestive) heart failure: Secondary | ICD-10-CM | POA: Insufficient documentation

## 2016-05-08 DIAGNOSIS — Z95810 Presence of automatic (implantable) cardiac defibrillator: Secondary | ICD-10-CM | POA: Insufficient documentation

## 2016-05-08 DIAGNOSIS — I4901 Ventricular fibrillation: Secondary | ICD-10-CM | POA: Diagnosis not present

## 2016-05-08 DIAGNOSIS — I519 Heart disease, unspecified: Secondary | ICD-10-CM | POA: Diagnosis not present

## 2016-05-08 DIAGNOSIS — I429 Cardiomyopathy, unspecified: Secondary | ICD-10-CM | POA: Diagnosis not present

## 2016-05-08 DIAGNOSIS — I110 Hypertensive heart disease with heart failure: Secondary | ICD-10-CM | POA: Insufficient documentation

## 2016-05-08 DIAGNOSIS — I071 Rheumatic tricuspid insufficiency: Secondary | ICD-10-CM | POA: Diagnosis not present

## 2016-06-18 ENCOUNTER — Other Ambulatory Visit: Payer: Medicare Other

## 2016-07-02 ENCOUNTER — Ambulatory Visit (INDEPENDENT_AMBULATORY_CARE_PROVIDER_SITE_OTHER): Payer: Medicare Other | Admitting: Internal Medicine

## 2016-07-02 ENCOUNTER — Encounter: Payer: Self-pay | Admitting: Internal Medicine

## 2016-07-02 VITALS — BP 126/87 | HR 65 | Temp 98.4°F | Ht 65.0 in | Wt 173.0 lb

## 2016-07-02 DIAGNOSIS — F5101 Primary insomnia: Secondary | ICD-10-CM

## 2016-07-02 DIAGNOSIS — B182 Chronic viral hepatitis C: Secondary | ICD-10-CM

## 2016-07-02 DIAGNOSIS — B20 Human immunodeficiency virus [HIV] disease: Secondary | ICD-10-CM

## 2016-07-02 DIAGNOSIS — Z113 Encounter for screening for infections with a predominantly sexual mode of transmission: Secondary | ICD-10-CM

## 2016-07-02 MED ORDER — TRAZODONE HCL 150 MG PO TABS: 150.0000 mg | ORAL_TABLET | Freq: Every evening | ORAL | 5 refills | Status: DC | PRN

## 2016-07-02 MED ORDER — MIRTAZAPINE 7.5 MG PO TABS: 7.5000 mg | ORAL_TABLET | Freq: Every day | ORAL | 5 refills | Status: DC

## 2016-07-02 NOTE — Progress Notes (Signed)
Patient ID: JHETT NEIRA, male   DOB: 1966/03/31, 51 y.o.   MRN: [id number]   Subjective:    Patient ID: CORION GUINAN, male    DOB: 24-Jan-1966, 51 y.o.   MRN: [id number]  HPI Here for follow up of his HIV.   He is on Complera and Tivicay.  He has been well controlled and CD4 330 with undetectable vl. No labs prior to visit today. No issues with HIV.  No missed doses.  More issues with sleep on 100 mg trazodone.  No weight loss, no weight gain.  No associated n/v.      Review of Systems  Constitutional: Negative for fatigue and fever.  HENT: Negative for trouble swallowing.   Respiratory: Negative for cough and shortness of breath.   Gastrointestinal: Negative for nausea.  Skin: Negative for rash.  Neurological: Negative for dizziness, light-headedness and headaches.       Objective:   Physical Exam  Constitutional: He appears well-developed and well-nourished. No distress.  HENT:  Mouth/Throat: No oropharyngeal exudate.  Eyes: No scleral icterus.  Cardiovascular: Normal rate, regular rhythm and normal heart sounds.   No murmur heard. Pulmonary/Chest: Effort normal and breath sounds normal. No respiratory distress. He has no wheezes.  Lymphadenopathy:    He has no cervical adenopathy.  Skin: No rash noted.   SHx: less beer intake       Assessment & Plan:

## 2016-07-02 NOTE — Assessment & Plan Note (Signed)
Doing well and labs today.  rtc 6 months unless concerns.

## 2016-07-02 NOTE — Assessment & Plan Note (Signed)
Will check virus though is difficult to treat with amiodarone.

## 2016-07-02 NOTE — Assessment & Plan Note (Signed)
Will try remeron which is a better option with amiodarone regardless.

## 2016-07-03 LAB — T-HELPER CELL (CD4) - (RCID CLINIC ONLY)
CD4 T CELL ABS: 540 /uL (ref 400–2700)
CD4 T CELL HELPER: 23 % — AB (ref 33–55)

## 2016-07-03 LAB — RPR

## 2016-07-06 LAB — HIV-1 RNA QUANT-NO REFLEX-BLD
HIV 1 RNA QUANT: NOT DETECTED {copies}/mL
HIV-1 RNA QUANT, LOG: NOT DETECTED {Log_copies}/mL

## 2016-07-07 LAB — HEPATITIS C RNA QUANTITATIVE
HCV QUANT LOG: 7.24 {Log_IU}/mL — AB
HCV Quantitative: 17200000 IU/mL — ABNORMAL HIGH

## 2016-07-15 ENCOUNTER — Telehealth: Payer: Self-pay | Admitting: Cardiology

## 2016-07-15 ENCOUNTER — Ambulatory Visit (INDEPENDENT_AMBULATORY_CARE_PROVIDER_SITE_OTHER): Payer: Medicare Other | Admitting: *Deleted

## 2016-07-15 DIAGNOSIS — I472 Ventricular tachycardia, unspecified: Secondary | ICD-10-CM

## 2016-07-15 NOTE — Telephone Encounter (Signed)
Spoke with pt and reminded pt of remote transmission that is due today. Pt verbalized understanding.   

## 2016-07-16 ENCOUNTER — Encounter: Payer: Self-pay | Admitting: Cardiology

## 2016-07-16 NOTE — Progress Notes (Signed)
Remote ICD transmission.   

## 2016-07-17 LAB — CUP PACEART REMOTE DEVICE CHECK
Battery Remaining Longevity: 52 mo
Battery Voltage: 2.97 V
Brady Statistic AP VP Percent: 0.1 %
Brady Statistic RV Percent Paced: 1.81 %
HighPow Impedance: 84 Ohm
Implantable Lead Implant Date: 20150126
Implantable Lead Implant Date: 20150126
Implantable Lead Location: 753860
Implantable Lead Model: 4396
Implantable Lead Model: 5076
Implantable Lead Model: 6935
Implantable Pulse Generator Implant Date: 20150126
Lead Channel Impedance Value: 1007 Ohm
Lead Channel Impedance Value: 475 Ohm
Lead Channel Pacing Threshold Amplitude: 0.75 V
Lead Channel Sensing Intrinsic Amplitude: 19.125 mV
Lead Channel Sensing Intrinsic Amplitude: 3.75 mV
Lead Channel Sensing Intrinsic Amplitude: 3.75 mV
Lead Channel Setting Pacing Amplitude: 1.75 V
MDC IDC LEAD IMPLANT DT: 20150126
MDC IDC LEAD LOCATION: 753858
MDC IDC LEAD LOCATION: 753859
MDC IDC MSMT LEADCHNL LV IMPEDANCE VALUE: 551 Ohm
MDC IDC MSMT LEADCHNL LV IMPEDANCE VALUE: 608 Ohm
MDC IDC MSMT LEADCHNL LV PACING THRESHOLD AMPLITUDE: 0.75 V
MDC IDC MSMT LEADCHNL LV PACING THRESHOLD PULSEWIDTH: 0.4 ms
MDC IDC MSMT LEADCHNL RA PACING THRESHOLD AMPLITUDE: 0.75 V
MDC IDC MSMT LEADCHNL RA PACING THRESHOLD PULSEWIDTH: 0.4 ms
MDC IDC MSMT LEADCHNL RV IMPEDANCE VALUE: 532 Ohm
MDC IDC MSMT LEADCHNL RV IMPEDANCE VALUE: 532 Ohm
MDC IDC MSMT LEADCHNL RV PACING THRESHOLD PULSEWIDTH: 0.4 ms
MDC IDC MSMT LEADCHNL RV SENSING INTR AMPL: 19.125 mV
MDC IDC SESS DTM: 20180214202136
MDC IDC SET LEADCHNL LV PACING PULSEWIDTH: 0.4 ms
MDC IDC SET LEADCHNL RA PACING AMPLITUDE: 2 V
MDC IDC SET LEADCHNL RV PACING AMPLITUDE: 2.5 V
MDC IDC SET LEADCHNL RV PACING PULSEWIDTH: 0.4 ms
MDC IDC SET LEADCHNL RV SENSING SENSITIVITY: 0.3 mV
MDC IDC STAT BRADY AP VS PERCENT: 0.02 %
MDC IDC STAT BRADY AS VP PERCENT: 98.42 %
MDC IDC STAT BRADY AS VS PERCENT: 1.47 %
MDC IDC STAT BRADY RA PERCENT PACED: 0.11 %

## 2016-07-27 ENCOUNTER — Other Ambulatory Visit: Payer: Self-pay | Admitting: Gastroenterology

## 2016-07-27 DIAGNOSIS — R001 Bradycardia, unspecified: Secondary | ICD-10-CM | POA: Diagnosis not present

## 2016-07-27 DIAGNOSIS — B20 Human immunodeficiency virus [HIV] disease: Secondary | ICD-10-CM | POA: Diagnosis not present

## 2016-07-27 DIAGNOSIS — I10 Essential (primary) hypertension: Secondary | ICD-10-CM | POA: Diagnosis not present

## 2016-07-27 DIAGNOSIS — Z800 Family history of malignant neoplasm of digestive organs: Secondary | ICD-10-CM | POA: Diagnosis not present

## 2016-07-27 DIAGNOSIS — Z1211 Encounter for screening for malignant neoplasm of colon: Secondary | ICD-10-CM | POA: Diagnosis not present

## 2016-07-30 ENCOUNTER — Encounter: Payer: Self-pay | Admitting: Cardiology

## 2016-08-07 ENCOUNTER — Encounter (HOSPITAL_COMMUNITY): Payer: Self-pay | Admitting: *Deleted

## 2016-08-08 ENCOUNTER — Other Ambulatory Visit (HOSPITAL_COMMUNITY): Payer: Self-pay | Admitting: Internal Medicine

## 2016-08-08 DIAGNOSIS — I5022 Chronic systolic (congestive) heart failure: Secondary | ICD-10-CM

## 2016-08-11 ENCOUNTER — Encounter (HOSPITAL_COMMUNITY): Payer: Self-pay | Admitting: *Deleted

## 2016-08-13 ENCOUNTER — Encounter (HOSPITAL_COMMUNITY): Payer: Self-pay | Admitting: *Deleted

## 2016-08-14 ENCOUNTER — Encounter (HOSPITAL_COMMUNITY)
Admission: RE | Disposition: A | Discharge status: Home / Self Care | Payer: Self-pay | Source: Ambulatory Visit | Attending: Gastroenterology

## 2016-08-14 ENCOUNTER — Ambulatory Visit (HOSPITAL_COMMUNITY): Payer: Medicare Other | Admitting: Anesthesiology

## 2016-08-14 ENCOUNTER — Ambulatory Visit (HOSPITAL_COMMUNITY)
Admission: RE | Admit: 2016-08-14 | Discharge: 2016-08-14 | Disposition: A | Discharge status: Home / Self Care | Payer: Medicare Other | Source: Ambulatory Visit | Attending: Gastroenterology | Admitting: Gastroenterology

## 2016-08-14 ENCOUNTER — Encounter (HOSPITAL_COMMUNITY): Payer: Self-pay | Admitting: Certified Registered Nurse Anesthetist

## 2016-08-14 DIAGNOSIS — Z8619 Personal history of other infectious and parasitic diseases: Secondary | ICD-10-CM | POA: Insufficient documentation

## 2016-08-14 DIAGNOSIS — K5730 Diverticulosis of large intestine without perforation or abscess without bleeding: Secondary | ICD-10-CM | POA: Insufficient documentation

## 2016-08-14 DIAGNOSIS — Z801 Family history of malignant neoplasm of trachea, bronchus and lung: Secondary | ICD-10-CM | POA: Diagnosis not present

## 2016-08-14 DIAGNOSIS — I10 Essential (primary) hypertension: Secondary | ICD-10-CM | POA: Diagnosis not present

## 2016-08-14 DIAGNOSIS — I428 Other cardiomyopathies: Secondary | ICD-10-CM | POA: Insufficient documentation

## 2016-08-14 DIAGNOSIS — K219 Gastro-esophageal reflux disease without esophagitis: Secondary | ICD-10-CM | POA: Insufficient documentation

## 2016-08-14 DIAGNOSIS — Z79899 Other long term (current) drug therapy: Secondary | ICD-10-CM | POA: Diagnosis not present

## 2016-08-14 DIAGNOSIS — Z950 Presence of cardiac pacemaker: Secondary | ICD-10-CM | POA: Diagnosis not present

## 2016-08-14 DIAGNOSIS — I480 Paroxysmal atrial fibrillation: Secondary | ICD-10-CM | POA: Diagnosis not present

## 2016-08-14 DIAGNOSIS — Z881 Allergy status to other antibiotic agents status: Secondary | ICD-10-CM | POA: Insufficient documentation

## 2016-08-14 DIAGNOSIS — Z800 Family history of malignant neoplasm of digestive organs: Secondary | ICD-10-CM | POA: Diagnosis not present

## 2016-08-14 DIAGNOSIS — Z21 Asymptomatic human immunodeficiency virus [HIV] infection status: Secondary | ICD-10-CM | POA: Diagnosis not present

## 2016-08-14 DIAGNOSIS — F329 Major depressive disorder, single episode, unspecified: Secondary | ICD-10-CM | POA: Diagnosis not present

## 2016-08-14 DIAGNOSIS — I110 Hypertensive heart disease with heart failure: Secondary | ICD-10-CM | POA: Diagnosis not present

## 2016-08-14 DIAGNOSIS — I5022 Chronic systolic (congestive) heart failure: Secondary | ICD-10-CM | POA: Insufficient documentation

## 2016-08-14 DIAGNOSIS — D550 Anemia due to glucose-6-phosphate dehydrogenase [G6PD] deficiency: Secondary | ICD-10-CM | POA: Insufficient documentation

## 2016-08-14 DIAGNOSIS — Z1211 Encounter for screening for malignant neoplasm of colon: Secondary | ICD-10-CM | POA: Diagnosis not present

## 2016-08-14 HISTORY — PX: COLONOSCOPY WITH PROPOFOL: SHX5780

## 2016-08-14 SURGERY — COLONOSCOPY WITH PROPOFOL
Anesthesia: Monitor Anesthesia Care

## 2016-08-14 MED ORDER — LACTATED RINGERS IV SOLN
INTRAVENOUS | Status: DC | PRN
  Administered 2016-08-14: 12:00:00 via INTRAVENOUS

## 2016-08-14 MED ORDER — LACTATED RINGERS IV SOLN
INTRAVENOUS | Status: DC
Start: 2016-08-14 — End: 2016-08-14

## 2016-08-14 MED ORDER — LIDOCAINE HCL (CARDIAC) 20 MG/ML IV SOLN: INTRAVENOUS | Status: DC | PRN

## 2016-08-14 MED ORDER — SODIUM CHLORIDE 0.9 % IV SOLN: INTRAVENOUS | Status: DC

## 2016-08-14 MED ORDER — PROPOFOL 10 MG/ML IV BOLUS
INTRAVENOUS | Status: DC | PRN
  Administered 2016-08-14: 40 mg via INTRAVENOUS
  Administered 2016-08-14 (×2): 20 mg via INTRAVENOUS
  Administered 2016-08-14 (×4): 40 mg via INTRAVENOUS

## 2016-08-14 MED ORDER — PROPOFOL 10 MG/ML IV BOLUS
INTRAVENOUS | Status: AC
  Filled 2016-08-14: qty 20

## 2016-08-14 MED ORDER — LIDOCAINE HCL (CARDIAC) 20 MG/ML IV SOLN
INTRAVENOUS | Status: DC | PRN
  Administered 2016-08-14: 80 mg via INTRAVENOUS

## 2016-08-14 SURGICAL SUPPLY — 22 items

## 2016-08-14 NOTE — Discharge Instructions (Signed)
Dr Jeani Hawking [phone]      Colonoscopy, Adult, Care After This sheet gives you information about how to care for yourself after your procedure. Your doctor may also give you more specific instructions. If you have problems or questions, call your doctor. Follow these instructions at home: General instructions    For the first 24 hours after the procedure:  Do not drive or use machinery.  Do not sign important documents.  Do not drink alcohol.  Do your daily activities more slowly than normal.  Eat foods that are soft and easy to digest.  Rest often.  Take over-the-counter or prescription medicines only as told by your doctor.  It is up to you to get the results of your procedure. Ask your doctor, or the department performing the procedure, when your results will be ready. To help cramping and bloating:   Try walking around.  Put heat on your belly (abdomen) as told by your doctor. Use a heat source that your doctor recommends, such as a moist heat pack or a heating pad.  Put a towel between your skin and the heat source.  Leave the heat on for 20-30 minutes.  Remove the heat if your skin turns bright red. This is especially important if you cannot feel pain, heat, or cold. You can get burned. Eating and drinking   Drink enough fluid to keep your pee (urine) clear or pale yellow.  Return to your normal diet as told by your doctor. Avoid heavy or fried foods that are hard to digest.  Avoid drinking alcohol for as long as told by your doctor. Contact a doctor if:  You have blood in your poop (stool) 2-3 days after the procedure. Get help right away if:  You have more than a small amount of blood in your poop.  You see large clumps of tissue (blood clots) in your poop.  Your belly is swollen.  You feel sick to your stomach (nauseous).  You throw up (vomit).  You have a fever.  You have belly pain that gets worse, and medicine does not help your  pain. This information is not intended to replace advice given to you by your health care provider. Make sure you discuss any questions you have with your health care provider. Document Released: 06/20/2010 Document Revised: 02/10/2016 Document Reviewed: 02/10/2016 Elsevier Interactive Patient Education  2017 [employer].

## 2016-08-14 NOTE — Op Note (Signed)
[facility] Patient Name: [name] Procedure Date: 08/14/2016 MRN: [id number] Attending MD: Jeani Hawking , MD Date of Birth: 11-Aug-1965 CSN: [id number] Age: 51 Admit Type: Outpatient Procedure:                Colonoscopy Indications:              Screening for colorectal malignant neoplasm Providers:                Jeani Hawking, MD, Tomma Rakers, RN, Lorenda Ishihara,                            Technician, Phillips Grout, CRNA Referring MD:              Medicines:                Propofol per Anesthesia Complications:            No immediate complications. Estimated Blood Loss:     Estimated blood loss: none. Procedure:                Pre-Anesthesia Assessment:                           - Prior to the procedure, a History and Physical                            was performed, and patient medications and                            allergies were reviewed. The patient's tolerance of                            previous anesthesia was also reviewed. The risks                            and benefits of the procedure and the sedation                            options and risks were discussed with the patient.                            All questions were answered, and informed consent                            was obtained. Prior Anticoagulants: The patient has                            taken no previous anticoagulant or antiplatelet                            agents. ASA Grade Assessment: III - A patient with                            severe systemic disease. After reviewing the risks  and benefits, the patient was deemed in                            satisfactory condition to undergo the procedure.                           - Sedation was administered by an anesthesia                            professional. Deep sedation was attained.                           After obtaining informed consent, the colonoscope                            was  passed under direct vision. Throughout the                            procedure, the patient's blood pressure, pulse, and                            oxygen saturations were monitored continuously. The                            EC-[id number] ([id number]) scope was introduced through                            the anus and advanced to the the cecum, identified                            by appendiceal orifice and ileocecal valve. The                            colonoscopy was performed without difficulty. The                            patient tolerated the procedure well. The quality                            of the bowel preparation was excellent. The                            ileocecal valve, appendiceal orifice, and rectum                            were photographed. Scope In: 11:55:37 AM Scope Out: 12:06:00 PM Scope Withdrawal Time: 0 hours 6 minutes 13 seconds  Total Procedure Duration: 0 hours 10 minutes 23 seconds  Findings:      A few small-mouthed diverticula were found in the ascending colon.       Retroflexion was not possible in the rectum as a result of patient       tolerance. Impression:               - Diverticulosis in the ascending colon.                           -  No specimens collected. Moderate Sedation:      N/A- Per Anesthesia Care Recommendation:           - Patient has a contact number available for                            emergencies. The signs and symptoms of potential                            delayed complications were discussed with the                            patient. Return to normal activities tomorrow.                            Written discharge instructions were provided to the                            patient.                           - Resume previous diet.                           - Continue present medications.                           - Repeat colonoscopy in 10 years for surveillance. Procedure Code(s):        --- Professional ---                            B5248, Colorectal cancer screening; colonoscopy on                            individual not meeting criteria for high risk Diagnosis Code(s):        --- Professional ---                           Z12.11, Encounter for screening for malignant                            neoplasm of colon                           K57.30, Diverticulosis of large intestine without                            perforation or abscess without bleeding CPT copyright 2016 American Medical Association. All rights reserved. The codes documented in this report are preliminary and upon coder review may  be revised to meet current compliance requirements. Jeani Hawking, MD Jeani Hawking, MD 08/14/2016 12:12:27 PM This report has been signed electronically. Number of Addenda: 0

## 2016-08-14 NOTE — Transfer of Care (Signed)
Immediate Anesthesia Transfer of Care Note  Patient: [name]  Procedure(s) Performed: Procedure(s): COLONOSCOPY WITH PROPOFOL (N/A)  Patient Location: PACU  Anesthesia Type:MAC  Level of Consciousness: sedated  Airway & Oxygen Therapy: Patient Spontanous Breathing and Patient connected to face mask oxygen  Post-op Assessment: Report given to RN and Post -op Vital signs reviewed and stable  Post vital signs: Reviewed and stable  Last Vitals:  Vitals:   08/14/16 1125  BP: (!) 153/94  Pulse: 69  Resp: 17  Temp: 36.9 C    Last Pain:  Vitals:   08/14/16 1125  TempSrc: Oral         Complications: No apparent anesthesia complications

## 2016-08-14 NOTE — Anesthesia Preprocedure Evaluation (Addendum)
Anesthesia Evaluation  Patient identified by MRN, date of birth, ID band Patient awake    Reviewed: Allergy & Precautions, NPO status , Patient's Chart, lab work & pertinent test results  Airway Mallampati: III  TM Distance: >3 FB Neck ROM: Full    Dental   Pulmonary neg pulmonary ROS,    breath sounds clear to auscultation       Cardiovascular hypertension, Pt. on medications and Pt. on home beta blockers + Cardiac Defibrillator  Rhythm:Regular Rate:Normal     Neuro/Psych Depression negative neurological ROS     GI/Hepatic GERD  ,(+) Hepatitis -, C  Endo/Other    Renal/GU Renal disease     Musculoskeletal   Abdominal   Peds  Hematology  (+) HIV,   Anesthesia Other Findings   Reproductive/Obstetrics                            Anesthesia Physical Anesthesia Plan  ASA: III  Anesthesia Plan: MAC   Post-op Pain Management:    Induction: Intravenous  Airway Management Planned: Natural Airway and Nasal Cannula  Additional Equipment:   Intra-op Plan:   Post-operative Plan:   Informed Consent: I have reviewed the patients History and Physical, chart, labs and discussed the procedure including the risks, benefits and alternatives for the proposed anesthesia with the patient or authorized representative who has indicated his/her understanding and acceptance.     Plan Discussed with:   Anesthesia Plan Comments:         Anesthesia Quick Evaluation

## 2016-08-14 NOTE — Anesthesia Postprocedure Evaluation (Signed)
Anesthesia Post Note  Patient: [name]  Procedure(s) Performed: Procedure(s) (LRB): COLONOSCOPY WITH PROPOFOL (N/A)  Patient location during evaluation: PACU Anesthesia Type: MAC Level of consciousness: awake and alert Pain management: pain level controlled Vital Signs Assessment: post-procedure vital signs reviewed and stable Respiratory status: spontaneous breathing, nonlabored ventilation, respiratory function stable and patient connected to nasal cannula oxygen Cardiovascular status: stable and blood pressure returned to baseline Anesthetic complications: no       Last Vitals:  Vitals:   08/14/16 1230 08/14/16 1240  BP: (!) 150/110 (!) 176/97  Pulse: 73 71  Resp: 19 (!) 21  Temp:      Last Pain:  Vitals:   08/14/16 1125  TempSrc: Oral                 Tiajuana Amass

## 2016-08-14 NOTE — H&P (Signed)
Manus Rudd HPI: This is a 51 year old male here for a screening colonoscopy.  He has a PMH significant for CHF and he is s/p AICD/pacemaker.  Past Medical History:  Diagnosis Date  . CHF (congestive heart failure) (HCC)   . Chronic systolic heart failure (HCC)    a. Echo 12/05/11:  EF 20-25%, diff HK with mild sparing of IL wall, mild AI, mod MR, mild LAE, mild RVE, mild reduced RVF.;  b.  Echo 05/11/2012:  Mild LVH, EF 20-25%, Gr 1 diast dysfn, mod MR, mild LAE  . Depression   . G6PD deficiency (HCC)   . GERD (gastroesophageal reflux disease)   . Hepatitis    Hep B and Hep C (patient does not report this but these are listed in previous notes.)  . HIV infection (HCC)   . Hypertension   . NICM (nonischemic cardiomyopathy) (HCC)    cardia CTA 8/13 negative for obstructive CAD    Past Surgical History:  Procedure Laterality Date  . BI-VENTRICULAR PACEMAKER INSERTION N/A 06/26/2013   Procedure: BI-VENTRICULAR PACEMAKER INSERTION (CRT-P);  Surgeon: Marinus Maw, MD;  Location: [facility] CATH LAB;  Service: Cardiovascular;  Laterality: N/A;  . ELECTROPHYSIOLOGY STUDY N/A 02/19/2012   Procedure: ELECTROPHYSIOLOGY STUDY;  Surgeon: Marinus Maw, MD;  Location: [facility] CATH LAB;  Service: Cardiovascular;  Laterality: N/A;  . ESOPHAGOGASTRODUODENOSCOPY  12/07/2011   Procedure: ESOPHAGOGASTRODUODENOSCOPY (EGD);  Surgeon: [name], MD,FACG;  Location: [facility] ENDOSCOPY;  Service: Endoscopy;  Laterality: N/A;  . FINGER SURGERY     Thumb laceration.    Marland Kitchen LEFT HEART CATHETERIZATION WITH CORONARY ANGIOGRAM N/A 01/31/2014   Procedure: LEFT HEART CATHETERIZATION WITH CORONARY ANGIOGRAM;  Surgeon: Iran Ouch, MD;  Location: [facility] CATH LAB;  Service: Cardiovascular;  Laterality: N/A;    Family History  Problem Relation Age of Onset  . Lung cancer Mother   . Colon cancer  50    Social History:  reports that he has never smoked. He has never used smokeless tobacco. He reports that he drinks about 0.6 oz  of alcohol per week . He reports that he uses drugs, including Marijuana, about 7 times per week.  Allergies:  Allergies  Allergen Reactions  . Bactrim Rash    Medications:  Scheduled:  Continuous: . sodium chloride      No results found for this or any previous visit (from the past 24 hour(s)).   No results found.  ROS:  As stated above in the HPI otherwise negative.  Blood pressure (!) 153/94, pulse 69, temperature 98.4 F (36.9 C), temperature source Oral, resp. rate 17, height 5' 4.5" (1.638 m), weight 78 kg (172 lb), SpO2 97 %.    PE: Gen: NAD, Alert and Oriented HEENT:  [place]/AT, EOMI Neck: Supple, no LAD Lungs: CTA Bilaterally CV: RRR without M/G/R ABM: Soft, NTND, +BS Ext: No C/C/E  Assessment/Plan: 1) Screening colonoscopy.  Prophet Renwick D 08/14/2016, 11:34 AM

## 2016-08-17 ENCOUNTER — Encounter (HOSPITAL_COMMUNITY): Payer: Self-pay | Admitting: Gastroenterology

## 2016-08-22 ENCOUNTER — Other Ambulatory Visit: Payer: Self-pay | Admitting: Internal Medicine

## 2016-09-11 ENCOUNTER — Other Ambulatory Visit: Payer: Self-pay | Admitting: Internal Medicine

## 2016-09-11 ENCOUNTER — Other Ambulatory Visit: Payer: Self-pay

## 2016-09-11 DIAGNOSIS — B20 Human immunodeficiency virus [HIV] disease: Secondary | ICD-10-CM

## 2016-09-11 MED ORDER — EMTRICITAB-RILPIVIR-TENOFOV AF 200-25-25 MG PO TABS: 1.0000 | ORAL_TABLET | Freq: Every day | ORAL | 5 refills | Status: DC

## 2016-09-11 NOTE — Telephone Encounter (Signed)
Pharmacy only requested Tivicay. Patient should be taking this medication with Odefsey per note.  [facility] sent electronically on same day as request for Tivicay.   Laurell Josephs, RN

## 2016-09-14 ENCOUNTER — Telehealth: Payer: Self-pay | Admitting: *Deleted

## 2016-09-14 NOTE — Telephone Encounter (Signed)
Patient called, requesting appointment, feels he has had chicken pox for 1-2 weeks. He states he is covered in pustules that dry and get a scab on them. He states they are everywhere, including his scalp.  Patient given appointment Thursday 4/19 at 9:30.  Please advise if he needs any care before his appointment. Andree Coss, RN

## 2016-09-17 ENCOUNTER — Encounter: Payer: Self-pay | Admitting: Internal Medicine

## 2016-09-17 ENCOUNTER — Ambulatory Visit (INDEPENDENT_AMBULATORY_CARE_PROVIDER_SITE_OTHER): Payer: Medicare Other | Admitting: Internal Medicine

## 2016-09-17 DIAGNOSIS — B86 Scabies: Secondary | ICD-10-CM | POA: Diagnosis not present

## 2016-09-17 MED ORDER — PERMETHRIN 5 % EX CREA: 1.0000 "application " | TOPICAL_CREAM | Freq: Once | CUTANEOUS | 0 refills | Status: AC

## 2016-09-17 MED ORDER — HYDROXYZINE HCL 50 MG PO TABS: 50.0000 mg | ORAL_TABLET | Freq: Three times a day (TID) | ORAL | 1 refills | Status: DC | PRN

## 2016-09-17 NOTE — Assessment & Plan Note (Addendum)
Not sure if scabies or not but will try permethrin and atarax for itching If no relief, can try itraconazole if no drug interactions

## 2016-09-17 NOTE — Progress Notes (Signed)
   Subjective:    Patient ID: [name], male    DOB: November 16, 1965, 51 y.o.   MRN: [id number]  HPI Here for a work in visit.   Has had a diffuse rash, itchy for about 1 week.  No relief with OTC creams.  Partner who shares his bed does not have anything.  No history of this.    Review of Systems  Skin: Positive for rash.       Wounds at the sites of scratching       Objective:   Physical Exam  Constitutional: He appears well-developed and well-nourished.  Eyes: No scleral icterus.  Skin:  Multiple 2-3 mm papilar areas on skin basically torso down legs          Assessment & Plan:

## 2016-09-21 ENCOUNTER — Telehealth: Payer: Self-pay | Admitting: *Deleted

## 2016-09-21 NOTE — Telephone Encounter (Signed)
Patient came by clinic today to advise that the rash he was seen for 09/17/16 is getting worse. He also dropped off SCAT forms to be filled out. Advised will call him once ready.

## 2016-09-22 ENCOUNTER — Other Ambulatory Visit: Payer: Self-pay | Admitting: Pharmacist

## 2016-09-22 MED ORDER — IVERMECTIN 3 MG PO TABS: 200.0000 ug/kg | ORAL_TABLET | Freq: Once | ORAL | 0 refills | Status: AC

## 2016-09-22 NOTE — Telephone Encounter (Signed)
Patient made a dermatologist appointment for today, but it was rescheduled by the provider to Friday.  Patient states the permethrin and atarax did not help, he would like to try the other medication that Dr Luciana Axe offered.  Per notes, itraconazole was discussed if no drug interactions.  Spoke with pharmacy for advice. Prescription needs to go to [facility] on [address].  Andree Coss, RN

## 2016-09-22 NOTE — Telephone Encounter (Signed)
Spoke to Dr. Luciana Axe and he actually meant Ivermectin. Sent in Rx to [facility]. Take 5 pills once then repeat again in 7 days.

## 2016-09-23 ENCOUNTER — Telehealth: Payer: Self-pay | Admitting: *Deleted

## 2016-09-23 NOTE — Telephone Encounter (Signed)
Patient notified that his SCAT form is completed and ready for pick up. It is up front in patient folder. Wendall Mola CMA

## 2016-09-25 DIAGNOSIS — B86 Scabies: Secondary | ICD-10-CM | POA: Diagnosis not present

## 2016-10-08 DIAGNOSIS — B86 Scabies: Secondary | ICD-10-CM | POA: Diagnosis not present

## 2016-10-14 ENCOUNTER — Ambulatory Visit (INDEPENDENT_AMBULATORY_CARE_PROVIDER_SITE_OTHER): Payer: Medicare Other | Admitting: *Deleted

## 2016-10-14 ENCOUNTER — Telehealth: Payer: Self-pay | Admitting: Cardiology

## 2016-10-14 DIAGNOSIS — I429 Cardiomyopathy, unspecified: Secondary | ICD-10-CM | POA: Diagnosis not present

## 2016-10-14 NOTE — Telephone Encounter (Signed)
LMOVM reminding pt to send remote transmission.   

## 2016-10-15 ENCOUNTER — Other Ambulatory Visit: Payer: Self-pay | Admitting: Internal Medicine

## 2016-10-15 ENCOUNTER — Encounter: Payer: Self-pay | Admitting: Cardiology

## 2016-10-15 LAB — CUP PACEART REMOTE DEVICE CHECK
Battery Remaining Longevity: 49 mo
Brady Statistic AP VP Percent: 0.03 %
Brady Statistic AP VS Percent: 0.01 %
Brady Statistic AS VP Percent: 98.68 %
Brady Statistic AS VS Percent: 1.28 %
Brady Statistic RA Percent Paced: 0.04 %
Brady Statistic RV Percent Paced: 6.54 %
Date Time Interrogation Session: 20180517013625
HighPow Impedance: 86 Ohm
Implantable Lead Location: 753858
Implantable Lead Location: 753859
Implantable Lead Location: 753860
Implantable Lead Model: 6935
Lead Channel Impedance Value: 1026 Ohm
Lead Channel Impedance Value: 513 Ohm
Lead Channel Impedance Value: 551 Ohm
Lead Channel Impedance Value: 551 Ohm
Lead Channel Impedance Value: 608 Ohm
Lead Channel Pacing Threshold Amplitude: 0.75 V
Lead Channel Pacing Threshold Pulse Width: 0.4 ms
Lead Channel Pacing Threshold Pulse Width: 0.4 ms
Lead Channel Sensing Intrinsic Amplitude: 15.5 mV
Lead Channel Sensing Intrinsic Amplitude: 2 mV
Lead Channel Sensing Intrinsic Amplitude: 2 mV
Lead Channel Setting Pacing Amplitude: 1.75 V
Lead Channel Setting Pacing Amplitude: 2 V
Lead Channel Setting Pacing Amplitude: 2.5 V
Lead Channel Setting Pacing Pulse Width: 0.4 ms
Lead Channel Setting Sensing Sensitivity: 0.3 mV
MDC IDC LEAD IMPLANT DT: 20150126
MDC IDC LEAD IMPLANT DT: 20150126
MDC IDC LEAD IMPLANT DT: 20150126
MDC IDC MSMT BATTERY VOLTAGE: 2.96 V
MDC IDC MSMT LEADCHNL RA IMPEDANCE VALUE: 475 Ohm
MDC IDC MSMT LEADCHNL RA PACING THRESHOLD AMPLITUDE: 0.625 V
MDC IDC MSMT LEADCHNL RV PACING THRESHOLD AMPLITUDE: 0.625 V
MDC IDC MSMT LEADCHNL RV PACING THRESHOLD PULSEWIDTH: 0.4 ms
MDC IDC MSMT LEADCHNL RV SENSING INTR AMPL: 15.5 mV
MDC IDC PG IMPLANT DT: 20150126
MDC IDC SET LEADCHNL LV PACING PULSEWIDTH: 0.4 ms

## 2016-10-15 NOTE — Progress Notes (Signed)
Remote ICD transmission.   

## 2016-10-19 ENCOUNTER — Telehealth (HOSPITAL_COMMUNITY): Payer: Self-pay | Admitting: Cardiology

## 2016-10-19 NOTE — Telephone Encounter (Signed)
Patient walked in with c/o decreased weight (20 lbs) and increased fatigue.  Patient reports he has done nothing different and weight is just coming off. Has not missed any medications.   Add on 10/20/16 @ 1020 with Dr. [name]

## 2016-10-20 ENCOUNTER — Ambulatory Visit (HOSPITAL_COMMUNITY)
Admission: RE | Admit: 2016-10-20 | Discharge: 2016-10-20 | Disposition: A | Discharge status: Home / Self Care | Payer: Medicare Other | Source: Ambulatory Visit | Attending: Internal Medicine | Admitting: Internal Medicine

## 2016-10-20 ENCOUNTER — Encounter (HOSPITAL_COMMUNITY): Payer: Self-pay | Admitting: Internal Medicine

## 2016-10-20 VITALS — BP 128/90 | HR 77 | Wt 157.8 lb

## 2016-10-20 DIAGNOSIS — I110 Hypertensive heart disease with heart failure: Secondary | ICD-10-CM | POA: Insufficient documentation

## 2016-10-20 DIAGNOSIS — R634 Abnormal weight loss: Secondary | ICD-10-CM | POA: Insufficient documentation

## 2016-10-20 DIAGNOSIS — K219 Gastro-esophageal reflux disease without esophagitis: Secondary | ICD-10-CM | POA: Insufficient documentation

## 2016-10-20 DIAGNOSIS — I472 Ventricular tachycardia: Secondary | ICD-10-CM | POA: Diagnosis not present

## 2016-10-20 DIAGNOSIS — Z79899 Other long term (current) drug therapy: Secondary | ICD-10-CM | POA: Diagnosis not present

## 2016-10-20 DIAGNOSIS — I5022 Chronic systolic (congestive) heart failure: Secondary | ICD-10-CM | POA: Insufficient documentation

## 2016-10-20 DIAGNOSIS — B20 Human immunodeficiency virus [HIV] disease: Secondary | ICD-10-CM | POA: Diagnosis not present

## 2016-10-20 LAB — BASIC METABOLIC PANEL
Anion gap: 5 (ref 5–15)
BUN: 33 mg/dL — ABNORMAL HIGH (ref 6–20)
CHLORIDE: 112 mmol/L — AB (ref 101–111)
CO2: 21 mmol/L — ABNORMAL LOW (ref 22–32)
Calcium: 9 mg/dL (ref 8.9–10.3)
Creatinine, Ser: 1.34 mg/dL — ABNORMAL HIGH (ref 0.61–1.24)
GFR calc Af Amer: >60 mL/min (ref >60)
GFR calc non Af Amer: 59 mL/min — ABNORMAL LOW (ref >60)
Glucose, Bld: 85 mg/dL (ref 65–99)
POTASSIUM: 4.8 mmol/L (ref 3.5–5.1)
Sodium: 138 mmol/L (ref 135–145)

## 2016-10-20 MED ORDER — POTASSIUM CHLORIDE CRYS ER 20 MEQ PO TBCR: EXTENDED_RELEASE_TABLET | ORAL | 2 refills | Status: DC

## 2016-10-20 MED ORDER — FUROSEMIDE 40 MG PO TABS: ORAL_TABLET | ORAL | 11 refills | Status: DC

## 2016-10-20 NOTE — Progress Notes (Signed)
Patient ID: [name], male   DOB: 1965-10-03, 51 y.o.   MRN: [id number]   [facility]  Patient ID: [name], male   DOB: 10/29/1965, 51 y.o.   MRN: [id number]  ---Medtronic Optivol ----   Primary Physician: [name], MD  Primary Cardiologist: Dr Ladona Ridgel  Primary HF: Dr. [name]    HPI: [name] is a 51 y.o. male with a history of HIV 1998, HCV, ETOH abuse and CHF due to NICM s/p Medtronic  CRT-D (06/2013). He also has a h/o syncope and previously had an EP study which was non-inducible for VT.    Was admitted 01/19/13- 01/22/13 for acute on chronic systolic HF. ECHO EF 20-25%. He was diuresed with IV lasix. He was discharged on coreg 6.25 mg BID, digoxin 0.25 mg daily, lisinopril 40 mg daily, and spironolactone 25 mg daily. Discharge weight 150 lbs. He stopped spironolactone due to severe headaches.  He was not able to tolerate titration of Coreg to 25 mg bid due to lightheadedness.   Follow up for Heart Failure:  Returns for f/u. Remains very stressed out. Says at the end of April was seen by the Dermatologist and was diagnosed with scabies. Was treated with oral meds and cream. Still itching. Very stressed out. Only eating Yoohoo and Popsicles. Only an occasional meal. No swelling or SOB. Feels dehydrated. Drinking 1 beer day. Taking lasix 40mg  day. Able to do all ADLs. No orthostasis. Just fatigued. Poor sleep. Lost 20 pounds.    ECHO 04/2013: EF 20-25%, mod/severe MR, LA mod dilated ECHO 10/16/13 EF 20-25%  ECHO 03/14/15 EF 55% ECHO 12/17 EF 55%  Labs (12/14): digoxin 0.5 Labs (2/15): K 4.2, creatinine 0.88 Labs (08/2013): K 4.2 Creatinine 1.02 Labs (10/15): K 4.0, creatinine 1.16 Labs (1/16): K 4.1, creatinine 1.28, AST 54, ALT 72 Labs (07/04/2014) : K 4.1 Creatinine 1.18   Optivol: Activity had gone down but now approaching ~6 hours again. Fluid below threshold. Occasional trend consistent with history. No VT or VF alerts  SH: Lives with  partner in [place], drinking a wine cooler twice a week (prior heavy ETOH).   ROS: All systems negative except as listed in HPI, PMH and Problem List.  Past Medical History:  Diagnosis Date  . CHF (congestive heart failure) (HCC)   . Chronic systolic heart failure (HCC)    a. Echo 12/05/11:  EF 20-25%, diff HK with mild sparing of IL wall, mild AI, mod MR, mild LAE, mild RVE, mild reduced RVF.;  b.  Echo 05/11/2012:  Mild LVH, EF 20-25%, Gr 1 diast dysfn, mod MR, mild LAE  . Depression   . G6PD deficiency (HCC)   . GERD (gastroesophageal reflux disease)   . Hepatitis    Hep B and Hep C (patient does not report this but these are listed in previous notes.)  . HIV infection (HCC)   . Hypertension   . NICM (nonischemic cardiomyopathy) (HCC)    cardia CTA 8/13 negative for obstructive CAD    Current Outpatient Prescriptions  Medication Sig Dispense Refill  . amiodarone (PACERONE) 200 MG tablet Take 1 tablet (200 mg total) by mouth daily. 30 tablet 11  . carvedilol (COREG) 25 MG tablet take 1 tablet by mouth twice a day with meals 60 tablet 3  . colchicine 0.6 MG tablet Take 1 tablet (0.6 mg total) by mouth 2 (two) times daily as needed. For gout flares. 30 tablet 2  . emtricitabine-rilpivir-tenofovir AF (ODEFSEY) 200-25-25 MG  TABS tablet Take 1 tablet by mouth daily with breakfast. 30 tablet 5  . hydrOXYzine (ATARAX/VISTARIL) 50 MG tablet take 1 tablet by mouth three times a day if needed 30 tablet 1  . mirtazapine (REMERON) 7.5 MG tablet Take 1 tablet (7.5 mg total) by mouth at bedtime. 30 tablet 5  . naproxen (NAPROSYN) 500 MG tablet Take 1 tablet (500 mg total) by mouth 2 (two) times daily. 30 tablet 0  . potassium chloride SA (K-DUR,KLOR-CON) 20 MEQ tablet take 1 tablet by mouth twice a day 180 tablet 2  . sacubitril-valsartan (ENTRESTO) 97-103 MG Take 1 tablet by mouth 2 (two) times daily. 60 tablet 6  . spironolactone (ALDACTONE) 25 MG tablet take 1/2 tablet by mouth once daily 15  tablet 3  . TIVICAY 50 MG tablet take 1 tablet by mouth once daily WITH LUNCH 30 tablet 5  . valACYclovir (VALTREX) 1000 MG tablet Take 1 tablet (1,000 mg total) by mouth 2 (two) times daily as needed (outbreaks). Take for 5 days for an outbreak (Patient not taking: Reported on 09/17/2016) 10 tablet 0   No current facility-administered medications for this encounter.     Vitals:   10/20/16 1026  BP: 128/90  Pulse: 77  SpO2: 99%  Weight: 157 lb 12 oz (71.6 kg)     Physical Exam:  General:  Well appearing. No resp difficulty HEENT: normal Neck: supple. no JVD. Carotids 2+ bilat; no bruits. No lymphadenopathy or thryomegaly appreciated. Cor: PMI nondisplaced. Regular rate & rhythm. No rubs, gallops or murmurs. Lungs: clear Abdomen: soft, nontender, nondistended. No hepatosplenomegaly. No bruits or masses. Good bowel sounds. Extremities: no cyanosis, clubbing, rash, edema  Healing sores Neuro: alert & orientedx3, cranial nerves grossly intact. moves all 4 extremities w/o difficulty. Affect pleasant   ASSESSMENT & PLAN:  1) Chronic systolic HF: Nonischemic cardiomyopathy, ?due to ETOH abuse. EF 55% (03/2015) s/p Medtronic CRT-D (06/2013).   - EF remains normal. NYHA II. Looks dry today. Stop lasix and potassium. Just take as need.. Check BMET. Encourage po intake - Continue coreg 25 mg BID, on goal dose. - Continue spironolactone 12.5 mg daily.  - Continue entresto 97-103 mg twice a day.  - Stressed need to avoid ETOH 2) ETOH abuse:   - Encouraged complete abstinence 3) Ventricular tachycardia:  - Followed by Dr. Ladona Ridgel. Continue amiodarone per Dr Ladona Ridgel.    - No VT on ICD interrogation 4) HTN -Blood pressure well controlled. Continue current regimen. 5) Itching -Use benadryl for itching and sleep 6) Weight loss - Likely related to depression. Reassured him that this is ok and he has likely been full treated for his scabies.   [name], MD  10:49 AM   [name]

## 2016-10-20 NOTE — Patient Instructions (Signed)
STOP Lasix and Potassium take only as needed for fluid/edema.  Routine lab work today. Will notify you of abnormal results  Follow up with Dr.Bensimhon in 6 months.

## 2016-10-20 NOTE — Addendum Note (Signed)
Encounter addended by: Modesta Messing, CMA on: 10/20/2016 10:56 AM<BR>    Actions taken: Diagnosis association updated, Order list changed, Sign clinical note

## 2016-10-30 ENCOUNTER — Other Ambulatory Visit: Payer: Self-pay | Admitting: Internal Medicine

## 2016-10-30 ENCOUNTER — Encounter: Payer: Self-pay | Admitting: Cardiology

## 2016-11-04 ENCOUNTER — Encounter (HOSPITAL_COMMUNITY): Payer: Self-pay

## 2016-11-04 ENCOUNTER — Emergency Department (HOSPITAL_COMMUNITY)
Admission: EM | Admit: 2016-11-04 | Discharge: 2016-11-04 | Disposition: A | Discharge status: Home / Self Care | Payer: Medicare Other | Attending: Emergency Medicine | Admitting: Emergency Medicine

## 2016-11-04 ENCOUNTER — Telehealth: Payer: Self-pay | Admitting: *Deleted

## 2016-11-04 DIAGNOSIS — I110 Hypertensive heart disease with heart failure: Secondary | ICD-10-CM | POA: Diagnosis not present

## 2016-11-04 DIAGNOSIS — Z79899 Other long term (current) drug therapy: Secondary | ICD-10-CM | POA: Diagnosis not present

## 2016-11-04 DIAGNOSIS — R21 Rash and other nonspecific skin eruption: Secondary | ICD-10-CM | POA: Diagnosis not present

## 2016-11-04 DIAGNOSIS — B20 Human immunodeficiency virus [HIV] disease: Secondary | ICD-10-CM | POA: Insufficient documentation

## 2016-11-04 DIAGNOSIS — I5032 Chronic diastolic (congestive) heart failure: Secondary | ICD-10-CM | POA: Diagnosis not present

## 2016-11-04 DIAGNOSIS — Z950 Presence of cardiac pacemaker: Secondary | ICD-10-CM | POA: Diagnosis not present

## 2016-11-04 MED ORDER — HYDROXYZINE HCL 25 MG PO TABS
50.0000 mg | ORAL_TABLET | Freq: Once | ORAL | Status: AC
  Administered 2016-11-04: 50 mg via ORAL
  Filled 2016-11-04: qty 2

## 2016-11-04 MED ORDER — LORAZEPAM 1 MG PO TABS: 1.0000 mg | ORAL_TABLET | Freq: Every day | ORAL | 0 refills | Status: DC

## 2016-11-04 MED ORDER — LORAZEPAM 1 MG PO TABS
1.0000 mg | ORAL_TABLET | Freq: Once | ORAL | Status: AC
  Administered 2016-11-04: 1 mg via ORAL
  Filled 2016-11-04: qty 1

## 2016-11-04 MED ORDER — FAMOTIDINE 20 MG PO TABS
20.0000 mg | ORAL_TABLET | Freq: Once | ORAL | Status: AC
  Administered 2016-11-04: 20 mg via ORAL
  Filled 2016-11-04: qty 1

## 2016-11-04 MED ORDER — HYDROXYZINE HCL 50 MG PO TABS: ORAL_TABLET | ORAL | 1 refills | Status: DC

## 2016-11-04 NOTE — Telephone Encounter (Signed)
Patient called c/o ongoing rash. No improvement and he had to go to the ED yesterday. He was told at the ED it is not scabies and he should follow up with his PCP, Dr. Luciana Axe. Advised nothing available and I can possibly schedule him with our pharmacist. He said that would not be necessary. He has a follow up with his dermatologist next week and I assured him this is the best course for him to follow; as they may need to do a biopsy of his rash. He agreed to this plan.

## 2016-11-04 NOTE — ED Triage Notes (Signed)
Pt here for recurring rash sts was told was scabies but has been treated three times for same and reports his partner has no symptoms and they are constantly in close contact and still has no symptoms. Pt is HIV positive.

## 2016-11-04 NOTE — ED Provider Notes (Signed)
[facility]-EMERGENCY DEPT Provider Note   CSN: [id number] Arrival date & time: 11/04/16  0032     History   Chief Complaint Chief Complaint  Patient presents with  . Rash    HPI [name] is a 51 y.o. male.  Patient with a history of HIV, PTSD, GERD, CHF, HTN, NICM presents with diffuse rash that is progressive and pruritic. He has been seen by Dr. Donzetta Starch, dermatology, and diagnosed with scabies. When the treatment did not work, he saw his ID doctor, Dr. Luciana Axe, and was again treated for scabies with an additional cream, which also did not work. He has been given steroids as well. No fever. No new medications or changes in dosing.    The history is provided by the patient. No language interpreter was used.  Rash      Past Medical History:  Diagnosis Date  . CHF (congestive heart failure) (HCC)   . Chronic systolic heart failure (HCC)    a. Echo 12/05/11:  EF 20-25%, diff HK with mild sparing of IL wall, mild AI, mod MR, mild LAE, mild RVE, mild reduced RVF.;  b.  Echo 05/11/2012:  Mild LVH, EF 20-25%, Gr 1 diast dysfn, mod MR, mild LAE  . Depression   . G6PD deficiency (HCC)   . GERD (gastroesophageal reflux disease)   . Hepatitis    Hep B and Hep C (patient does not report this but these are listed in previous notes.)  . HIV infection (HCC)   . Hypertension   . NICM (nonischemic cardiomyopathy) (HCC)    cardia CTA 8/13 negative for obstructive CAD    Patient Active Problem List   Diagnosis Date Noted  . Scabies 09/17/2016  . Screening examination for venereal disease 03/21/2015  . Encounter for long-term (current) use of medications 03/21/2015  . Pain in joint, ankle and foot 03/21/2015  . Insomnia 06/26/2014  . Dehydration 02/22/2014  . AKI (acute kidney injury) (HCC) 02/22/2014  . Hypokalemia 02/04/2014  . Ventricular tachycardia, polymorphic (HCC) 02/03/2014  . Headache 02/03/2014  . PTSD (post-traumatic stress disorder) 02/03/2014  . Implantable  cardioverter-defibrillator (ICD) discharge 01/27/2014  . Sustained VT (ventricular tachycardia) (HCC) 01/27/2014  . ICD (implantable cardioverter-defibrillator), biventricular, in situ 01/23/2014  . Paroxysmal ventricular fibrillation (HCC) 01/19/2014  . Gout attack 04/24/2013  . Protein-calorie malnutrition, severe (HCC) 02/13/2013  . GERD (gastroesophageal reflux disease) 08/19/2012  . Chronic systolic heart failure (HCC) 01/25/2012  . Wide-complex tachycardia (HCC) 01/07/2012  . Cardiomyopathy (HCC) 12/07/2011  . Alcohol abuse 11/30/2011  . Marijuana abuse 11/30/2011  . Human immunodeficiency virus (HIV) disease (HCC) 06/09/2006  . GENITAL HERPES 06/09/2006  . Chronic hepatitis C without hepatic coma (HCC) 06/09/2006  . Essential hypertension 06/09/2006    Past Surgical History:  Procedure Laterality Date  . BI-VENTRICULAR PACEMAKER INSERTION N/A 06/26/2013   Procedure: BI-VENTRICULAR PACEMAKER INSERTION (CRT-P);  Surgeon: Marinus Maw, MD;  Location: [facility] CATH LAB;  Service: Cardiovascular;  Laterality: N/A;  . COLONOSCOPY WITH PROPOFOL N/A 08/14/2016   Procedure: COLONOSCOPY WITH PROPOFOL;  Surgeon: Jeani Hawking, MD;  Location: [facility] ENDOSCOPY;  Service: Endoscopy;  Laterality: N/A;  . ELECTROPHYSIOLOGY STUDY N/A 02/19/2012   Procedure: ELECTROPHYSIOLOGY STUDY;  Surgeon: Marinus Maw, MD;  Location: [facility] CATH LAB;  Service: Cardiovascular;  Laterality: N/A;  . ESOPHAGOGASTRODUODENOSCOPY  12/07/2011   Procedure: ESOPHAGOGASTRODUODENOSCOPY (EGD);  Surgeon: [name], MD,FACG;  Location: [facility] ENDOSCOPY;  Service: Endoscopy;  Laterality: N/A;  . FINGER SURGERY     Thumb  laceration.    Marland Kitchen LEFT HEART CATHETERIZATION WITH CORONARY ANGIOGRAM N/A 01/31/2014   Procedure: LEFT HEART CATHETERIZATION WITH CORONARY ANGIOGRAM;  Surgeon: Iran Ouch, MD;  Location: [facility] CATH LAB;  Service: Cardiovascular;  Laterality: N/A;       Home Medications    Prior to Admission medications   Medication  Sig Start Date End Date Taking? Authorizing Provider  amiodarone (PACERONE) 200 MG tablet Take 1 tablet (200 mg total) by mouth daily. 04/15/16   Marinus Maw, MD  carvedilol (COREG) 25 MG tablet take 1 tablet by mouth twice a day with meals 08/10/16   Bensimhon, Bevelyn Buckles, MD  colchicine 0.6 MG tablet Take 1 tablet (0.6 mg total) by mouth 2 (two) times daily as needed. For gout flares. 12/25/14   [name], MD  emtricitabine-rilpivir-tenofovir AF (ODEFSEY) 200-25-25 MG TABS tablet Take 1 tablet by mouth daily with breakfast. 09/11/16   Comer, [name], MD  furosemide (LASIX) 40 MG tablet Take 40mg  twice daily as needed for fluid and weight gain 10/20/16 10/21/18  Bensimhon, Bevelyn Buckles, MD  hydrOXYzine (ATARAX/VISTARIL) 50 MG tablet take 1 tablet by mouth three times a day if needed 10/15/16   [name], MD  mirtazapine (REMERON) 7.5 MG tablet Take 1 tablet (7.5 mg total) by mouth at bedtime. 07/02/16   [name], MD  naproxen (NAPROSYN) 500 MG tablet Take 1 tablet (500 mg total) by mouth 2 (two) times daily. 02/28/16   Marlon Pel, PA-C  potassium chloride SA (K-DUR,KLOR-CON) 20 MEQ tablet Take 1 tablet by mouth twice daily when you take Lasix. 10/20/16   Bensimhon, Bevelyn Buckles, MD  sacubitril-valsartan (ENTRESTO) 97-103 MG Take 1 tablet by mouth 2 (two) times daily. 01/07/16   Bensimhon, Bevelyn Buckles, MD  spironolactone (ALDACTONE) 25 MG tablet take 1/2 tablet by mouth once daily 08/10/16   Bensimhon, Bevelyn Buckles, MD  TIVICAY 50 MG tablet take 1 tablet by mouth once daily WITH LUNCH 09/11/16   Comer, [name], MD  valACYclovir (VALTREX) 1000 MG tablet Take 1 tablet (1,000 mg total) by mouth 2 (two) times daily as needed (outbreaks). Take for 5 days for an outbreak Patient not taking: Reported on 09/17/2016 02/22/14   Bensimhon, Bevelyn Buckles, MD    Family History Family History  Problem Relation Age of Onset  . Lung cancer Mother   . Colon cancer Unknown 45    Social History Social History    Substance Use Topics  . Smoking status: Never Smoker  . Smokeless tobacco: Never Used  . Alcohol use 0.6 oz/week    1 Cans of beer per week     Comment: occ beer     Allergies   Bactrim   Review of Systems Review of Systems  Constitutional: Negative for chills and fever.  Respiratory: Negative.  Negative for cough.   Gastrointestinal: Negative.  Negative for nausea.  Musculoskeletal: Negative.  Negative for myalgias.  Skin: Positive for rash.       See HPI.  Neurological: Negative.      Physical Exam Updated Vital Signs BP 94/68 (BP Location: Right Arm)   Pulse 85   Temp 98.1 F (36.7 C) (Oral)   Resp 20   Ht 5\' 4"  (1.626 m)   Wt 69.9 kg (154 lb)   SpO2 97%   BMI 26.43 kg/m   Physical Exam  Constitutional: He is oriented to person, place, and time. He appears well-developed and well-nourished.  Neck: Normal range of motion.  Pulmonary/Chest: Effort normal.  Musculoskeletal: Normal range of motion.  Neurological: He is alert and oriented to person, place, and time.  Skin: Skin is warm and dry.  Diffuse, singular raised erythemtous bumps measuring from 0.5 cm to 1 cm in diameter in various stages of healing. No pustules or blisters. There is no evidence of secondary infection.   Psychiatric: He has a normal mood and affect.     ED Treatments / Results  Labs (all labs ordered are listed, but only abnormal results are displayed) Labs Reviewed - No data to display  EKG  EKG Interpretation None       Radiology No results found.  Procedures Procedures (including critical care time)  Medications Ordered in ED Medications  LORazepam (ATIVAN) tablet 1 mg (1 mg Oral Given 11/04/16 0311)  famotidine (PEPCID) tablet 20 mg (20 mg Oral Given 11/04/16 0311)  hydrOXYzine (ATARAX/VISTARIL) tablet 50 mg (50 mg Oral Given 11/04/16 1610)     Initial Impression / Assessment and Plan / ED Course  I have reviewed the triage vital signs and the nursing  notes.  Pertinent labs & imaging results that were available during my care of the patient were reviewed by me and considered in my medical decision making (see chart for details).     Patient with rash x 2 weeks that persists despite multiple attempts at treatment. Atarax, Pepcid and Ativan provided without relief. No evidence of infection. He can be discharged home with recommendation to follow up with Dr. Yetta Barre and Dr. Luciana Axe in the outpatient setting.   Final Clinical Impressions(s) / ED Diagnoses   Final diagnoses:  None   1. Rash  New Prescriptions New Prescriptions   No medications on file     Elpidio Anis, Cordelia Poche 11/04/16 0413    Shon Baton, MD 11/04/16 [phone]

## 2016-11-09 DIAGNOSIS — L98429 Non-pressure chronic ulcer of back with unspecified severity: Secondary | ICD-10-CM | POA: Diagnosis not present

## 2016-11-09 DIAGNOSIS — L281 Prurigo nodularis: Secondary | ICD-10-CM | POA: Diagnosis not present

## 2016-11-24 DIAGNOSIS — L281 Prurigo nodularis: Secondary | ICD-10-CM | POA: Diagnosis not present

## 2016-11-26 DIAGNOSIS — L281 Prurigo nodularis: Secondary | ICD-10-CM | POA: Diagnosis not present

## 2016-11-30 ENCOUNTER — Other Ambulatory Visit: Payer: Self-pay | Admitting: Internal Medicine

## 2016-12-01 DIAGNOSIS — L281 Prurigo nodularis: Secondary | ICD-10-CM | POA: Diagnosis not present

## 2016-12-03 ENCOUNTER — Ambulatory Visit (HOSPITAL_COMMUNITY)
Admission: EM | Admit: 2016-12-03 | Discharge: 2016-12-03 | Disposition: A | Discharge status: Home / Self Care | Payer: Medicare Other | Attending: Family Medicine | Admitting: Family Medicine

## 2016-12-03 ENCOUNTER — Encounter (HOSPITAL_COMMUNITY): Payer: Self-pay | Admitting: Emergency Medicine

## 2016-12-03 ENCOUNTER — Telehealth: Payer: Self-pay | Admitting: *Deleted

## 2016-12-03 DIAGNOSIS — M71122 Other infective bursitis, left elbow: Secondary | ICD-10-CM | POA: Diagnosis not present

## 2016-12-03 DIAGNOSIS — L281 Prurigo nodularis: Secondary | ICD-10-CM | POA: Diagnosis not present

## 2016-12-03 MED ORDER — CLINDAMYCIN HCL 300 MG PO CAPS: 300.0000 mg | ORAL_CAPSULE | Freq: Three times a day (TID) | ORAL | 0 refills | Status: DC

## 2016-12-03 NOTE — Telephone Encounter (Signed)
Patient walked into clinic requesting to be seen by a nurse. He is c/o all over body rash and swollen and painful left elbow. I looked at patient's elbow and it appeared to be red, swollen, and it was warm to the touch. I spoke to Dr. Luciana Axe and he advised that patient go to either Urgent Care of ED for evaluation, as there are no available appointments in clinic today. Patient agreed to this plan. [name]

## 2016-12-03 NOTE — Discharge Instructions (Signed)
It is highly likely you may have septic olecranon bursitis. I have started you on an antibiotic, this will eventually need to be drained. Should you change you mind, return to clinic for draining or follow up with an orthopedist.

## 2016-12-03 NOTE — ED Triage Notes (Signed)
Here for persistent rash on 2 months ... Has been to dermatologist for similars sx  Also reports left infection/cellulitis onset 2 weeks.... Was told by PCP to come here to have elbow drained  Sx today include pain, swelling and redness of elbow   Denies fevers, chills   A&O x4... NAD... Ambulatory

## 2016-12-04 NOTE — ED Provider Notes (Signed)
CSN: [id number]     Arrival date & time 12/03/16  1549 History   None    Chief Complaint  Patient presents with  . Rash   (Consider location/radiation/quality/duration/timing/severity/associated sxs/prior Treatment) 51 year old male presents to clinic for evaluation of fall body rash that is been present for several months. Also here for evaluation of a possible septic bursa. States she was sent here by his primary care to "have it drained" he denies any fever, chills, nausea, vomiting, or other systemic symptoms. Does have a past medical history of CHF, depression, G6PD deficiency GERD, and HIV. States his HIV is well under control, on his last CD4 count was normal. With regard to the rash is been to a dermatologist several times, has had multiple various treatments all with minimal success. Describes it as painful, and itchy as well.   The history is provided by the patient.  Rash    Past Medical History:  Diagnosis Date  . CHF (congestive heart failure) (HCC)   . Chronic systolic heart failure (HCC)    a. Echo 12/05/11:  EF 20-25%, diff HK with mild sparing of IL wall, mild AI, mod MR, mild LAE, mild RVE, mild reduced RVF.;  b.  Echo 05/11/2012:  Mild LVH, EF 20-25%, Gr 1 diast dysfn, mod MR, mild LAE  . Depression   . G6PD deficiency (HCC)   . GERD (gastroesophageal reflux disease)   . Hepatitis    Hep B and Hep C (patient does not report this but these are listed in previous notes.)  . HIV infection (HCC)   . Hypertension   . NICM (nonischemic cardiomyopathy) (HCC)    cardia CTA 8/13 negative for obstructive CAD   Past Surgical History:  Procedure Laterality Date  . BI-VENTRICULAR PACEMAKER INSERTION N/A 06/26/2013   Procedure: BI-VENTRICULAR PACEMAKER INSERTION (CRT-P);  Surgeon: Marinus Maw, MD;  Location: [facility] CATH LAB;  Service: Cardiovascular;  Laterality: N/A;  . COLONOSCOPY WITH PROPOFOL N/A 08/14/2016   Procedure: COLONOSCOPY WITH PROPOFOL;  Surgeon: Jeani Hawking, MD;   Location: [facility] ENDOSCOPY;  Service: Endoscopy;  Laterality: N/A;  . ELECTROPHYSIOLOGY STUDY N/A 02/19/2012   Procedure: ELECTROPHYSIOLOGY STUDY;  Surgeon: Marinus Maw, MD;  Location: [facility] CATH LAB;  Service: Cardiovascular;  Laterality: N/A;  . ESOPHAGOGASTRODUODENOSCOPY  12/07/2011   Procedure: ESOPHAGOGASTRODUODENOSCOPY (EGD);  Surgeon: [name], MD,FACG;  Location: [facility] ENDOSCOPY;  Service: Endoscopy;  Laterality: N/A;  . FINGER SURGERY     Thumb laceration.    Marland Kitchen LEFT HEART CATHETERIZATION WITH CORONARY ANGIOGRAM N/A 01/31/2014   Procedure: LEFT HEART CATHETERIZATION WITH CORONARY ANGIOGRAM;  Surgeon: Iran Ouch, MD;  Location: [facility] CATH LAB;  Service: Cardiovascular;  Laterality: N/A;   Family History  Problem Relation Age of Onset  . Lung cancer Mother   . Colon cancer Unknown 14   Social History  Substance Use Topics  . Smoking status: Never Smoker  . Smokeless tobacco: Never Used  . Alcohol use 0.6 oz/week    1 Cans of beer per week     Comment: occ beer    Review of Systems  Constitutional: Negative.   HENT: Negative.   Respiratory: Negative.   Cardiovascular: Negative.   Gastrointestinal: Negative.   Musculoskeletal: Positive for joint swelling.  Skin: Positive for rash.  Neurological: Negative.   Hematological: Negative.     Allergies  Bactrim  Home Medications   Prior to Admission medications   Medication Sig Start Date End Date Taking? Authorizing Provider  amiodarone (PACERONE) 200  MG tablet Take 1 tablet (200 mg total) by mouth daily. 04/15/16  Yes Marinus Maw, MD  carvedilol (COREG) 25 MG tablet take 1 tablet by mouth twice a day with meals 08/10/16  Yes Bensimhon, Bevelyn Buckles, MD  colchicine 0.6 MG tablet Take 1 tablet (0.6 mg total) by mouth 2 (two) times daily as needed. For gout flares. 12/25/14  Yes Comer, [name], MD  emtricitabine-rilpivir-tenofovir AF (ODEFSEY) 200-25-25 MG TABS tablet Take 1 tablet by mouth daily with breakfast. 09/11/16  Yes  Comer, [name], MD  furosemide (LASIX) 40 MG tablet Take 40mg  twice daily as needed for fluid and weight gain 10/20/16 10/21/18 Yes Bensimhon, Bevelyn Buckles, MD  hydrOXYzine (ATARAX/VISTARIL) 50 MG tablet take 1 tablet by mouth three times a day if needed 11/04/16  Yes Upstill, Shari, PA-C  sacubitril-valsartan (ENTRESTO) 97-103 MG Take 1 tablet by mouth 2 (two) times daily. 01/07/16  Yes Bensimhon, Bevelyn Buckles, MD  spironolactone (ALDACTONE) 25 MG tablet take 1/2 tablet by mouth once daily 08/10/16  Yes Bensimhon, Bevelyn Buckles, MD  TIVICAY 50 MG tablet take 1 tablet by mouth once daily WITH LUNCH 09/11/16  Yes Comer, [name], MD  clindamycin (CLEOCIN) 300 MG capsule Take 1 capsule (300 mg total) by mouth 3 (three) times daily. 12/03/16   Dorena Bodo, NP  LORazepam (ATIVAN) 1 MG tablet Take 1 tablet (1 mg total) by mouth at bedtime. 11/04/16   Elpidio Anis, PA-C  mirtazapine (REMERON) 7.5 MG tablet Take 1 tablet (7.5 mg total) by mouth at bedtime. 07/02/16   [name], MD  naproxen (NAPROSYN) 500 MG tablet Take 1 tablet (500 mg total) by mouth 2 (two) times daily. 02/28/16   Marlon Pel, PA-C  potassium chloride SA (K-DUR,KLOR-CON) 20 MEQ tablet Take 1 tablet by mouth twice daily when you take Lasix. 10/20/16   Bensimhon, Bevelyn Buckles, MD  valACYclovir (VALTREX) 1000 MG tablet Take 1 tablet (1,000 mg total) by mouth 2 (two) times daily as needed (outbreaks). Take for 5 days for an outbreak Patient not taking: Reported on 09/17/2016 02/22/14   Bensimhon, Bevelyn Buckles, MD   Meds Ordered and Administered this Visit  Medications - No data to display  BP 115/89 (BP Location: Right Arm)   Pulse 64   Temp 99.1 F (37.3 C) (Oral)   Resp 20   SpO2 99%  No data found.   Physical Exam  Constitutional: He is oriented to person, place, and time. He appears well-developed and well-nourished. No distress.  HENT:  Head: Normocephalic and atraumatic.  Right Ear: External ear normal.  Left Ear: External ear normal.   Eyes: Conjunctivae are normal.  Neck: Normal range of motion.  Cardiovascular: Normal rate and regular rhythm.   Pulmonary/Chest: Effort normal and breath sounds normal.  Musculoskeletal: He exhibits edema and tenderness.  Notable swelling and redness along with erythema to the left BURSA. NO EVIDENCE OF STREAKING OR OVERLYING CELLULITIS.  Neurological: He is alert and oriented to person, place, and time.  Skin: Skin is warm and dry. Capillary refill takes less than 2 seconds. He is not diaphoretic.  Rash is full body, multiple small less than 1 cm lesions that appear ulcerative, inflamed, several them are draining.  Psychiatric: He has a normal mood and affect. His behavior is normal.  Nursing note and vitals reviewed.   Urgent Care Course     Procedures (including critical care time)  Labs Review Labs Reviewed - No data to display  Imaging Review No  results found.   MDM   1. Septic olecranon bursitis of left elbow     Based on signs, symptoms, physical exam findings, I agree with patient's primary care provider this is most likely septic BURSITIS. OFFER WAS MADE TO DRAIN THE BURSA IN CLINIC, HOWEVER PATIENT DID DECLINE THIS PROCEDURE AT THIS TIME. STARTING ON CLINDAMYCIN TO COVER FOR STAPH. COUNSELING WAS PROVIDED TO THE PATIENT TO FOLLOW-UP WITH ORTHOPEDICS OR PRIMARY CARE OR RETURN TO CLINIC AS NEEDED, THAT IF THIS IS INDEED A SEPTIC BURSA IT WILL NEED TO BE DRAINED OTHERWISE SYMPTOMS WILL PERSIST OR WORSEN.    Dorena Bodo, NP 12/04/16 [phone]

## 2016-12-14 ENCOUNTER — Telehealth (HOSPITAL_COMMUNITY): Payer: Self-pay | Admitting: *Deleted

## 2016-12-14 NOTE — Telephone Encounter (Signed)
Pt called concerned about SOB and fatigue.  He does not weigh himself daily but feels he has gained about 20 lbs since he was here last.  At last OV (5/22) his wt was down and per Dr [name] pt looked dry so Lasix was changed from 40 mg BID to as needed only.  Pt states he gets very SOB and tired w/any activity.  Denies edema but feels abd maybe swollen.  He states he has been taking his Lasix 40 mg daily for about 5 days but it has not helped at all.  Discussed w/Amy Filbert Schilder, NP she recommends pt increase to Lasix 40 mg BID and f/u later this week.  OK w/Dr Shirlee Latch to add pt to his sch on Fri as there are no other appts.  Pt aware, agreeable and verbalizes understanding, appt sch for 7/23 at 10am

## 2016-12-18 ENCOUNTER — Emergency Department (HOSPITAL_COMMUNITY): Payer: Medicare Other

## 2016-12-18 ENCOUNTER — Encounter (HOSPITAL_COMMUNITY): Payer: Self-pay | Admitting: Cardiology

## 2016-12-18 ENCOUNTER — Encounter: Payer: Self-pay | Admitting: Cardiology

## 2016-12-18 ENCOUNTER — Encounter (HOSPITAL_COMMUNITY): Payer: Self-pay

## 2016-12-18 ENCOUNTER — Other Ambulatory Visit (HOSPITAL_COMMUNITY): Payer: Self-pay

## 2016-12-18 ENCOUNTER — Ambulatory Visit (HOSPITAL_BASED_OUTPATIENT_CLINIC_OR_DEPARTMENT_OTHER)
Admission: RE | Admit: 2016-12-18 | Discharge: 2016-12-18 | Disposition: A | Discharge status: Home / Self Care | Payer: Medicare Other | Source: Ambulatory Visit | Attending: Cardiology | Admitting: Cardiology

## 2016-12-18 ENCOUNTER — Other Ambulatory Visit: Payer: Self-pay

## 2016-12-18 ENCOUNTER — Inpatient Hospital Stay (HOSPITAL_COMMUNITY)
Admission: EM | Admit: 2016-12-18 | Discharge: 2016-12-19 | DRG: 682 | Disposition: A | Discharge status: Home / Self Care | Payer: Medicare Other | Attending: Family Medicine | Admitting: Family Medicine

## 2016-12-18 VITALS — BP 74/54 | HR 74 | Wt 161.4 lb

## 2016-12-18 DIAGNOSIS — R945 Abnormal results of liver function studies: Secondary | ICD-10-CM | POA: Diagnosis not present

## 2016-12-18 DIAGNOSIS — E860 Dehydration: Secondary | ICD-10-CM | POA: Diagnosis not present

## 2016-12-18 DIAGNOSIS — K219 Gastro-esophageal reflux disease without esophagitis: Secondary | ICD-10-CM | POA: Diagnosis present

## 2016-12-18 DIAGNOSIS — B182 Chronic viral hepatitis C: Secondary | ICD-10-CM | POA: Diagnosis present

## 2016-12-18 DIAGNOSIS — I472 Ventricular tachycardia: Secondary | ICD-10-CM | POA: Diagnosis present

## 2016-12-18 DIAGNOSIS — I110 Hypertensive heart disease with heart failure: Secondary | ICD-10-CM | POA: Diagnosis present

## 2016-12-18 DIAGNOSIS — K521 Toxic gastroenteritis and colitis: Secondary | ICD-10-CM | POA: Diagnosis present

## 2016-12-18 DIAGNOSIS — Z95810 Presence of automatic (implantable) cardiac defibrillator: Secondary | ICD-10-CM | POA: Diagnosis not present

## 2016-12-18 DIAGNOSIS — F4310 Post-traumatic stress disorder, unspecified: Secondary | ICD-10-CM | POA: Diagnosis not present

## 2016-12-18 DIAGNOSIS — A6000 Herpesviral infection of urogenital system, unspecified: Secondary | ICD-10-CM | POA: Diagnosis present

## 2016-12-18 DIAGNOSIS — N179 Acute kidney failure, unspecified: Principal | ICD-10-CM | POA: Diagnosis present

## 2016-12-18 DIAGNOSIS — I959 Hypotension, unspecified: Secondary | ICD-10-CM | POA: Diagnosis not present

## 2016-12-18 DIAGNOSIS — I5022 Chronic systolic (congestive) heart failure: Secondary | ICD-10-CM | POA: Diagnosis not present

## 2016-12-18 DIAGNOSIS — K8020 Calculus of gallbladder without cholecystitis without obstruction: Secondary | ICD-10-CM | POA: Diagnosis not present

## 2016-12-18 DIAGNOSIS — T3695XA Adverse effect of unspecified systemic antibiotic, initial encounter: Secondary | ICD-10-CM | POA: Diagnosis present

## 2016-12-18 DIAGNOSIS — Z79899 Other long term (current) drug therapy: Secondary | ICD-10-CM

## 2016-12-18 DIAGNOSIS — I428 Other cardiomyopathies: Secondary | ICD-10-CM | POA: Diagnosis not present

## 2016-12-18 DIAGNOSIS — M7022 Olecranon bursitis, left elbow: Secondary | ICD-10-CM | POA: Diagnosis present

## 2016-12-18 DIAGNOSIS — M109 Gout, unspecified: Secondary | ICD-10-CM | POA: Diagnosis not present

## 2016-12-18 DIAGNOSIS — I951 Orthostatic hypotension: Secondary | ICD-10-CM | POA: Diagnosis present

## 2016-12-18 DIAGNOSIS — T501X5A Adverse effect of loop [high-ceiling] diuretics, initial encounter: Secondary | ICD-10-CM | POA: Diagnosis not present

## 2016-12-18 DIAGNOSIS — B20 Human immunodeficiency virus [HIV] disease: Secondary | ICD-10-CM | POA: Diagnosis present

## 2016-12-18 DIAGNOSIS — L98419 Non-pressure chronic ulcer of buttock with unspecified severity: Secondary | ICD-10-CM | POA: Diagnosis present

## 2016-12-18 DIAGNOSIS — F1020 Alcohol dependence, uncomplicated: Secondary | ICD-10-CM | POA: Diagnosis not present

## 2016-12-18 DIAGNOSIS — R21 Rash and other nonspecific skin eruption: Secondary | ICD-10-CM | POA: Diagnosis present

## 2016-12-18 DIAGNOSIS — I4729 Other ventricular tachycardia: Secondary | ICD-10-CM

## 2016-12-18 DIAGNOSIS — R748 Abnormal levels of other serum enzymes: Secondary | ICD-10-CM

## 2016-12-18 LAB — BRAIN NATRIURETIC PEPTIDE: B NATRIURETIC PEPTIDE 5: 49.5 pg/mL (ref 0.0–100.0)

## 2016-12-18 LAB — CBC WITH DIFFERENTIAL/PLATELET
BASOS ABS: 0.1 10*3/uL (ref 0.0–0.1)
BASOS PCT: 1 %
EOS PCT: 2 %
Eosinophils Absolute: 0.2 10*3/uL (ref 0.0–0.7)
HEMATOCRIT: 37.9 % — AB (ref 39.0–52.0)
Hemoglobin: 12.2 g/dL — ABNORMAL LOW (ref 13.0–17.0)
LYMPHS PCT: 24 %
Lymphs Abs: 2.5 10*3/uL (ref 0.7–4.0)
MCH: 29 pg (ref 26.0–34.0)
MCHC: 32.2 g/dL (ref 30.0–36.0)
MCV: 90 fL (ref 78.0–100.0)
Monocytes Absolute: 0.9 10*3/uL (ref 0.1–1.0)
Monocytes Relative: 8 %
NEUTROS ABS: 6.7 10*3/uL (ref 1.7–7.7)
Neutrophils Relative %: 65 %
PLATELETS: 133 10*3/uL — AB (ref 150–400)
RBC: 4.21 MIL/uL — AB (ref 4.22–5.81)
RDW: 16.5 % — ABNORMAL HIGH (ref 11.5–15.5)
WBC: 10.4 10*3/uL (ref 4.0–10.5)

## 2016-12-18 LAB — I-STAT CG4 LACTIC ACID, ED
LACTIC ACID, VENOUS: 1.54 mmol/L (ref 0.5–1.9)
LACTIC ACID, VENOUS: 0.61 mmol/L (ref 0.5–1.9)

## 2016-12-18 LAB — COMPREHENSIVE METABOLIC PANEL
ALT: 92 U/L — ABNORMAL HIGH (ref 17–63)
ANION GAP: 14 (ref 5–15)
AST: 146 U/L — AB (ref 15–41)
Albumin: 2.9 g/dL — ABNORMAL LOW (ref 3.5–5.0)
Alkaline Phosphatase: 167 U/L — ABNORMAL HIGH (ref 38–126)
BUN: 69 mg/dL — AB (ref 6–20)
CHLORIDE: 108 mmol/L (ref 101–111)
CO2: 18 mmol/L — ABNORMAL LOW (ref 22–32)
Calcium: 8.9 mg/dL (ref 8.9–10.3)
Creatinine, Ser: 4.37 mg/dL — ABNORMAL HIGH (ref 0.61–1.24)
GFR calc Af Amer: 16 mL/min — ABNORMAL LOW (ref >60)
GFR, EST NON AFRICAN AMERICAN: 14 mL/min — AB (ref >60)
Glucose, Bld: 109 mg/dL — ABNORMAL HIGH (ref 65–99)
POTASSIUM: 4.7 mmol/L (ref 3.5–5.1)
Sodium: 140 mmol/L (ref 135–145)
Total Bilirubin: 2.8 mg/dL — ABNORMAL HIGH (ref 0.3–1.2)
Total Protein: 7.2 g/dL (ref 6.5–8.1)

## 2016-12-18 LAB — I-STAT TROPONIN, ED: Troponin i, poc: 0 ng/mL (ref 0.00–0.08)

## 2016-12-18 LAB — LIPASE, BLOOD: LIPASE: 29 U/L (ref 11–51)

## 2016-12-18 MED ORDER — HEPARIN SODIUM (PORCINE) 5000 UNIT/ML IJ SOLN
5000.0000 [IU] | Freq: Three times a day (TID) | INTRAMUSCULAR | Status: DC
  Administered 2016-12-19: 5000 [IU] via SUBCUTANEOUS
  Filled 2016-12-18 (×2): qty 1

## 2016-12-18 MED ORDER — LORAZEPAM 1 MG PO TABS: 1.0000 mg | ORAL_TABLET | Freq: Four times a day (QID) | ORAL | Status: DC | PRN

## 2016-12-18 MED ORDER — SACUBITRIL-VALSARTAN 97-103 MG PO TABS: 1.0000 | ORAL_TABLET | Freq: Two times a day (BID) | ORAL | Status: DC

## 2016-12-18 MED ORDER — HYDROXYZINE HCL 25 MG PO TABS: 25.0000 mg | ORAL_TABLET | ORAL | Status: DC | PRN

## 2016-12-18 MED ORDER — LORAZEPAM 2 MG/ML IJ SOLN
1.0000 mg | Freq: Four times a day (QID) | INTRAMUSCULAR | Status: DC | PRN
Start: 2016-12-18 — End: 2016-12-19

## 2016-12-18 MED ORDER — PIPERACILLIN-TAZOBACTAM 3.375 G IVPB 30 MIN
3.3750 g | Freq: Once | INTRAVENOUS | Status: AC
  Administered 2016-12-18: 3.375 g via INTRAVENOUS
  Filled 2016-12-18: qty 50

## 2016-12-18 MED ORDER — DOLUTEGRAVIR SODIUM 50 MG PO TABS
50.0000 mg | ORAL_TABLET | Freq: Every day | ORAL | Status: DC
  Administered 2016-12-19: 50 mg via ORAL
  Filled 2016-12-18: qty 1

## 2016-12-18 MED ORDER — AMIODARONE HCL 200 MG PO TABS
200.0000 mg | ORAL_TABLET | Freq: Every day | ORAL | Status: DC
  Administered 2016-12-18 – 2016-12-19 (×2): 200 mg via ORAL
  Filled 2016-12-18 (×2): qty 1

## 2016-12-18 MED ORDER — FOLIC ACID 1 MG PO TABS
1.0000 mg | ORAL_TABLET | Freq: Every day | ORAL | Status: DC
  Administered 2016-12-18 – 2016-12-19 (×2): 1 mg via ORAL
  Filled 2016-12-18 (×2): qty 1

## 2016-12-18 MED ORDER — VANCOMYCIN HCL IN DEXTROSE 1-5 GM/200ML-% IV SOLN: 1000.0000 mg | Freq: Once | INTRAVENOUS | Status: DC

## 2016-12-18 MED ORDER — ENSURE ENLIVE PO LIQD: 237.0000 mL | Freq: Two times a day (BID) | ORAL | Status: DC

## 2016-12-18 MED ORDER — THIAMINE HCL 100 MG/ML IJ SOLN
100.0000 mg | Freq: Every day | INTRAMUSCULAR | Status: DC
  Filled 2016-12-18: qty 2

## 2016-12-18 MED ORDER — LORAZEPAM 1 MG PO TABS
1.0000 mg | ORAL_TABLET | Freq: Every day | ORAL | Status: DC
  Administered 2016-12-18: 1 mg via ORAL
  Filled 2016-12-18: qty 1

## 2016-12-18 MED ORDER — VITAMIN B-1 100 MG PO TABS
100.0000 mg | ORAL_TABLET | Freq: Every day | ORAL | Status: DC
  Administered 2016-12-18 – 2016-12-19 (×2): 100 mg via ORAL
  Filled 2016-12-18 (×2): qty 1

## 2016-12-18 MED ORDER — SODIUM CHLORIDE 0.9 % IV BOLUS (SEPSIS)
1000.0000 mL | Freq: Once | INTRAVENOUS | Status: AC
  Administered 2016-12-18: 1000 mL via INTRAVENOUS

## 2016-12-18 MED ORDER — FUROSEMIDE 20 MG PO TABS: 40.0000 mg | ORAL_TABLET | Freq: Two times a day (BID) | ORAL | Status: DC

## 2016-12-18 MED ORDER — HYDROXYZINE HCL 25 MG PO TABS: 50.0000 mg | ORAL_TABLET | ORAL | Status: DC | PRN

## 2016-12-18 MED ORDER — VANCOMYCIN HCL IN DEXTROSE 1-5 GM/200ML-% IV SOLN
1000.0000 mg | INTRAVENOUS | Status: DC
  Administered 2016-12-18: 1000 mg via INTRAVENOUS
  Filled 2016-12-18: qty 200

## 2016-12-18 MED ORDER — SODIUM CHLORIDE 0.9 % IV BOLUS (SEPSIS)
500.0000 mL | Freq: Once | INTRAVENOUS | Status: AC
  Administered 2016-12-18: 500 mL via INTRAVENOUS

## 2016-12-18 MED ORDER — POTASSIUM CHLORIDE CRYS ER 10 MEQ PO TBCR: 10.0000 meq | EXTENDED_RELEASE_TABLET | Freq: Every day | ORAL | Status: DC

## 2016-12-18 MED ORDER — HYDROXYZINE HCL 25 MG PO TABS
50.0000 mg | ORAL_TABLET | Freq: Once | ORAL | Status: AC
  Administered 2016-12-18: 50 mg via ORAL
  Filled 2016-12-18: qty 2

## 2016-12-18 MED ORDER — EMTRICITAB-RILPIVIR-TENOFOV AF 200-25-25 MG PO TABS
1.0000 | ORAL_TABLET | Freq: Every day | ORAL | Status: DC
  Administered 2016-12-19: 1 via ORAL
  Filled 2016-12-18: qty 1

## 2016-12-18 MED ORDER — SPIRONOLACTONE 12.5 MG HALF TABLET: 12.5000 mg | ORAL_TABLET | Freq: Every day | ORAL | Status: DC

## 2016-12-18 MED ORDER — PIPERACILLIN-TAZOBACTAM IN DEX 2-0.25 GM/50ML IV SOLN
2.2500 g | Freq: Three times a day (TID) | INTRAVENOUS | Status: DC
  Filled 2016-12-18: qty 50

## 2016-12-18 MED ORDER — SODIUM CHLORIDE 0.9 % IV SOLN
INTRAVENOUS | Status: DC
  Administered 2016-12-18 – 2016-12-19 (×3): via INTRAVENOUS

## 2016-12-18 MED ORDER — ADULT MULTIVITAMIN W/MINERALS CH
1.0000 | ORAL_TABLET | Freq: Every day | ORAL | Status: DC
  Administered 2016-12-18 – 2016-12-19 (×2): 1 via ORAL
  Filled 2016-12-18 (×2): qty 1

## 2016-12-18 NOTE — ED Triage Notes (Signed)
Pt presents with > 2 week h/o L elbow pain, redness and swelling.  Pt was seen at UC, placed on abx, was not hypotensive at that point, reports dizziness and lightheadedness. Pt also reports diarrhea since abx, reports bright red blood from rectum when he wipes; denies any abdominal pain, denies dysuria.

## 2016-12-18 NOTE — Progress Notes (Signed)
Patient verbalized thoughts of harming self. Patient stated he felt hopeless & embarrassed of his appearance.  "I would rather be dead than to live with these spots".  Patient stated he is really depresses and tired of this.  MD notified.

## 2016-12-18 NOTE — Progress Notes (Signed)
Went to see [name] today with Dr. Alanda Slim. Was paged by nurse that patient had suicidal ideations. When I spoke to patient he stated he was frustrated with rash and said "I would just rather die than have this rash". Patient denied plans to harm himself and did not want to harm himself at the moment. Patient did not seem to have active suicidal ideations at the moment, and stated he was more frustrated. No need for suicidal precautions or sitter at this time.   Orpah Clinton, PGY-1 [facility]  12/18/2016 8:03 PM

## 2016-12-18 NOTE — Progress Notes (Signed)
Patient ID: [name], male   DOB: Dec 12, 1965, 51 y.o.   MRN: [id number]   [facility]  Patient ID: [name], male   DOB: 03/01/1966, 51 y.o.   MRN: [id number]  ---Medtronic Optivol ----   Primary Physician: [name], MD  Primary Cardiologist: Dr Ladona Ridgel  Primary HF: Dr. [name]    HPI: [name] is a 51 y.o. male with a history of HIV 1998, HCV, ETOH abuse and CHF due to NICM s/p Medtronic  CRT-D (06/2013). He also has a h/o syncope and previously had an EP study which was non-inducible for VT.    Was admitted 01/19/13- 01/22/13 for acute on chronic systolic HF. ECHO EF 20-25%. He was diuresed with IV lasix. He was discharged on coreg 6.25 mg BID, digoxin 0.25 mg daily, lisinopril 40 mg daily, and spironolactone 25 mg daily. Discharge weight 150 lbs. He stopped spironolactone due to severe headaches.  He was not able to tolerate titration of Coreg to 25 mg bid due to lightheadedness.   Last echo in 12/17 shows that EF remains back in normal range.   Follow up for Heart Failure:  Patient comes for acute work-in today.  For the last 1-2 weeks, he has felt profoundly weak.  He has been lightheaded with standing.  Today, BP is 74/54 sitting and drops to systolic 60 with standing.  He reports poor appetite, nausea/vomiting x > 1 week.  He has had multiple episodes of watery diarrhea daily.  He has Lasix for prn use.  He says that he has been taking his lasix 40 mg daily for the last week because he felt bad and thought maybe it was congestive heart failure.  His abdomen is more distended than normal and he is jaundiced/icteric.    He has also had a papular rash all over his body since 4/18.  He has been treated several times for scabies without improvement.  He did have a biopsy. He says that the dermatologist is not sure what is going on.   Optivol reviewed today: Fluid index < threshold with increasing thoracic impedance suggesting no volume overload.  No  VT.   ECHO 04/2013: EF 20-25%, mod/severe MR, LA mod dilated ECHO 10/16/13 EF 20-25%  ECHO 03/14/15 EF 55% ECHO 12/17 EF 55%  Labs (12/14): digoxin 0.5 Labs (2/15): K 4.2, creatinine 0.88 Labs (08/2013): K 4.2 Creatinine 1.02 Labs (10/15): K 4.0, creatinine 1.16 Labs (1/16): K 4.1, creatinine 1.28, AST 54, ALT 72 Labs (07/04/2014) : K 4.1 Creatinine 1.18  Labs (5/18): K 4.8, creatinine 1.3  SH: Lives with partner in [place], drinking a wine cooler twice a week (prior heavy ETOH).   ROS: All systems negative except as listed in HPI, PMH and Problem List.  Family History  Problem Relation Age of Onset  . Lung cancer Mother   . Colon cancer Unknown 50   Past Medical History:  Diagnosis Date  . CHF (congestive heart failure) (HCC)   . Chronic systolic heart failure (HCC)    a. Echo 12/05/11:  EF 20-25%, diff HK with mild sparing of IL wall, mild AI, mod MR, mild LAE, mild RVE, mild reduced RVF.;  b.  Echo 05/11/2012:  Mild LVH, EF 20-25%, Gr 1 diast dysfn, mod MR, mild LAE  . Depression   . G6PD deficiency (HCC)   . GERD (gastroesophageal reflux disease)   . Hepatitis    Hep B and Hep C (patient does not report this but these  are listed in previous notes.)  . HIV infection (HCC)   . Hypertension   . NICM (nonischemic cardiomyopathy) (HCC)    cardia CTA 8/13 negative for obstructive CAD    Current Outpatient Prescriptions  Medication Sig Dispense Refill  . amiodarone (PACERONE) 200 MG tablet Take 1 tablet (200 mg total) by mouth daily. 30 tablet 11  . carvedilol (COREG) 25 MG tablet take 1 tablet by mouth twice a day with meals 60 tablet 3  . colchicine 0.6 MG tablet Take 1 tablet (0.6 mg total) by mouth 2 (two) times daily as needed. For gout flares. 30 tablet 2  . emtricitabine-rilpivir-tenofovir AF (ODEFSEY) 200-25-25 MG TABS tablet Take 1 tablet by mouth daily with breakfast. 30 tablet 5  . furosemide (LASIX) 40 MG tablet Take 40mg  twice daily as needed for fluid and weight  gain 60 tablet 11  . hydrOXYzine (ATARAX/VISTARIL) 50 MG tablet take 1 tablet by mouth three times a day if needed 15 tablet 1  . LORazepam (ATIVAN) 1 MG tablet Take 1 tablet (1 mg total) by mouth at bedtime. 8 tablet 0  . mirtazapine (REMERON) 7.5 MG tablet Take 1 tablet (7.5 mg total) by mouth at bedtime. 30 tablet 5  . naproxen (NAPROSYN) 500 MG tablet Take 1 tablet (500 mg total) by mouth 2 (two) times daily. 30 tablet 0  . potassium chloride SA (K-DUR,KLOR-CON) 20 MEQ tablet Take 1 tablet by mouth twice daily when you take Lasix. 180 tablet 2  . sacubitril-valsartan (ENTRESTO) 97-103 MG Take 1 tablet by mouth 2 (two) times daily. 60 tablet 6  . spironolactone (ALDACTONE) 25 MG tablet take 1/2 tablet by mouth once daily 15 tablet 3  . TIVICAY 50 MG tablet take 1 tablet by mouth once daily WITH LUNCH 30 tablet 5  . valACYclovir (VALTREX) 1000 MG tablet Take 1 tablet (1,000 mg total) by mouth 2 (two) times daily as needed (outbreaks). Take for 5 days for an outbreak 10 tablet 0   No current facility-administered medications for this encounter.     Vitals:   12/18/16 1009  BP: (!) 74/54  Pulse: 74  SpO2: 97%  Weight: 161 lb 6 oz (73.2 kg)    Physical Exam:  General:  Ill-appearing HEENT: Icteric Neck: supple. no JVD. Carotids 2+ bilat; no bruits. No lymphadenopathy or thryomegaly appreciated. Cor: PMI nondisplaced. Regular rate & rhythm. No rubs, gallops or murmurs. Lungs: Clear to auscultation bilaterally.  Abdomen: soft, nontender, moderately distended. No hepatosplenomegaly. No bruits or masses. Good bowel sounds. Extremities: no cyanosis, clubbing, rash, edema.  Neuro: alert & orientedx3, cranial nerves grossly intact. moves all 4 extremities w/o difficulty. Affect pleasant Skin: Jaundice.  Papular rash across body.   ASSESSMENT & PLAN:  1) Chronic systolic HF: Nonischemic cardiomyopathy, ?due to ETOH abuse. EF 55% (03/2015) s/p Medtronic CRT-D (06/2013).   EF remained normal  on 12/17 echo (55%).  He is not volume overloaded, in fact is likely hypovolemic as he has been taking Lasix every day for the last week along with nausea/vomiting, poor po intake, and diarrhea.  Today, he has orthostatic hypotension. Optivol confirms lack of volume overload.  - Stop Lasix.  - Will need to stop Coreg, spironolactone, and Entresto for now.  He can eventually start back but would use lower doses especially as his EF has normalized.  - Continue to avoid ETOH 2) Orthostatic hypotension: Suspect this is due to a combination of nausea, vomiting, poor po intake, diarrhea, and use of his prn  Lasix every day for the last week.  - I am going to send him to the ER. I am concerned he will have AKI.  He will need IV fluid.   - Will need to stop Lasix and Coreg/Entresto/spironolactone for now => when starts back on cardiac meds will need lower doses.  3) Jaundice/icterus: Patient has history of HCV.  His abdomen is more distended than usual.  Will need LFTs sent in the ER.  4) Ventricular tachycardia: Followed by Dr. Ladona Ridgel. Continue amiodarone.    - No VT on ICD interrogation 5) Rash: Uncertain etiology, does not seem to be scabies given lack of response to treatment and not seen on biopsy.  6) Nausea/vomiting/diarrhea: ?Viral gastroenteritis.   Given Mr Poblano's constellation of symptoms and marked orthostasis, I am going to send him to the ER.  Suspect he will need admission on the medical service to sort things out.   Marca Ancona 12/18/2016 11:13 AM

## 2016-12-18 NOTE — ED Notes (Signed)
Attempted to call report

## 2016-12-18 NOTE — ED Notes (Signed)
Pt given po fluids and becomes nauseated.  Dry heaveas butNo emesis noted.

## 2016-12-18 NOTE — Progress Notes (Signed)
Pharmacy Antibiotic Note [name] is a 51 y.o. male admitted on 12/18/2016 with bacteremia.  Pharmacy has been consulted for Zosyn and vancomycin dosing. Noted to have an AKI.   Plan: 1. Vancomycin 1000 IV every 48 hours.  Goal trough 15-20 mcg/mL.  2. Zosyn 2.25 grams IV every 8 hours (infuse over 30 minutes). 3. F/u renal function and adjust abx as needed.  Height: 5' 4.5" (163.8 cm) Weight: 161 lb (73 kg) IBW/kg (Calculated) : 60.35  Temp (24hrs), Avg:97.9 F (36.6 C), Min:97.9 F (36.6 C), Max:97.9 F (36.6 C)   Recent Labs Lab 12/18/16 1136 12/18/16 1149 12/18/16 1221  WBC 10.4  --   --   CREATININE  --   --  4.37*  LATICACIDVEN  --  1.54  --     Estimated Creatinine Clearance: 18.1 mL/min (A) (by C-G formula based on SCr of 4.37 mg/dL (H)).    Allergies  Allergen Reactions  . Bactrim Rash    Thank you for allowing pharmacy to be a part of this patient's care.  Pollyann Samples, PharmD, BCPS 12/18/2016, 2:14 PM

## 2016-12-18 NOTE — Progress Notes (Signed)
Per Dr. Rulon Abide no suicide precautions needed.

## 2016-12-18 NOTE — H&P (Signed)
Family Medicine Teaching [facility] Admission History and Physical Service Pager: [phone]  Patient name: [name] Medical record number: [id number] Date of birth: 10-Sep-1965 Age: 50 y.o. Gender: male  Primary Care Provider: [name], MD Consultants: None Code Status: Full   Chief Complaint: hypotension   Assessment and Plan: [name] is a 51 y.o. male presenting with hypotension and rash . PMH is significant for HIV, genital herpes, hepatitis C, essential HTN, alcohol abuse, marijuana abuse, marijuana abuse, cardiomyopathy, systolic heart failure, GERD, gout, paroxysmal v fib, ICD defibrillator placement, ventricular tachycardia, and PTSD.   Hypotension BP of 74/54 in the ED. Was given 1.5 L of fluid and blood pressure improved now to 111/79. Likely due to dehydration. Patient reports weight loss and dry mouth on exam. Also reporting recent history of ~2 weeks of diarrhea after completing course of antibiotics.  -continue to monitor  -continue fluids @ 125 mL/hr  Rash Erythematous, circular, umbilicated rash throughout extremities, torso, back, ears, and nose. Patient describes as pruritic and painful. Possible etiologies include HIV sequelae infection or molluscum contagiosum. Unlikely scabies due to lack of response to treatment and no scabies seen on biopsy at dermatologist.  -continue to monitor -RPR -hydroxyzine 25 mg q4 hrs prn for itching  -consider ID and/or dermatology consult in the AM   Elevated liver enzymes  Liver enzymes elevated on admission. AST of 146, ALT of 92. Slight scleral icterus on exam. Also with history of Hep C and current alcohol abuse (see below).  -continue to monitor  - AM CMP  AKI Cr of 4.37 on admission. Baseline ~ 1.0. Has been taking lasix daily. Suspect secondary to dehydration and Lasix usage.  -d/c lasix -continue fluid resuscitation  -continue to monitor  Alcohol abuse Per nurse, patient drinks a 6 pack a day  and last drink was 3 days ago. -monitor on CIWA protocol   Systolic heart failure  Per chart review, due to nonischemic cardiomyopathy likely due to alcohol abuse. Echo in 12/17 showing EF of 55%. Patient appeared euvolemic on exam. Patient was discontinued on lasix, coreg, spironolactone, and entresto at today's visit with Dr. Shirlee Latch.  -monitor fluid status -consider heart failure consult   HTN Current BP of 117/79. Patient was discontinued on lasix, coreg, spironolactone, and entresto at today's visit with Dr. Shirlee Latch. Patient was hypotensive in ED. -continue to monitor  -hold home medications while hypotensive  HIV Monitored by ID as outpatient. CD4 count on 07/02/2106 of 23. Followed by Dr. Luciana Axe. Last appt reportedly two weeks prior to admission.  -continue home medications   Arrhythmia Controlled by ICD/defibrillator -continue home medications   FEN/GI: heart healthy diet  Prophylaxis: heparin   Disposition: continue to monitor   History of Present Illness:  [name] is a 51 y.o. male presenting with hypotension and rash. Rash began in April and has progressed to bumps all over his body. Describes rash as very itchy and painful. Rash is present on all extremities, torso, back, inside mouth, ears, and in rectum. Patient is unable to sleep at night due to discomfort. He has been seen by dermatology, Dr. Donzetta Starch, who initially diagnosed him with scabies, and was treated four different times with triamcinolone and another unknown ointment with no improvement. Dermatology following failed treatment regimen biopsied lesions and stated they were hypertrophic changes rather than scabies and was prescribed a cream. Last Friday he was seen in urgent care because of left elbow swelling, which improved with Keflex. Patient  states no improvement with rash but now states diarrhea following antibiotic use but denies abdominal pain. Patient is urinating normally. Patient notes vomiting first  thing in AM for past week. Patient denies fever and chills. Patient endorses a 26 lb unintentional weight loss in 2 weeks one month ago. Patient has gained some of the weight back but is still not at baseline (went from 174 lb > 154 lb > 161 lb). Patient notes no change in appetite, but states he does not normally have large appetite. Prior to admission patient had follow up appointment with Dr. Shirlee Latch, heart failure, for follow up on heart failure. At appointment patient was noted to be weak and had systolic BP in the 60s when standing. Patient was sent to ED from heart failure office for assessment of BP. Patient endorses dizziness, and fatigues easily when walking. Patient states he feels dehydrated and mouth feels dry. Endorses SOB x2wks which has been worsening. Fatigue is also getting worse. Symptoms do not improve or worsen with position. Patient has history of HIV and taking Odefsey and Tivicay. Patient states levels have been undetectable for 20 years. Patient is followed by Dr. Luciana Axe, last appointment was two weeks ago, but was sent to urgent care then because of left elbow swelling.     Review Of Systems: Per HPI with the following additions:   Review of Systems  Constitutional: Positive for malaise/fatigue and weight loss. Negative for chills and fever.  Respiratory: Positive for shortness of breath.   Gastrointestinal: Positive for diarrhea and vomiting. Negative for abdominal pain.  Genitourinary: Negative for dysuria.  Skin: Positive for itching and rash.  Neurological: Positive for dizziness.    Patient Active Problem List   Diagnosis Date Noted  . Scabies 09/17/2016  . Screening examination for venereal disease 03/21/2015  . Encounter for long-term (current) use of medications 03/21/2015  . Pain in joint, ankle and foot 03/21/2015  . Insomnia 06/26/2014  . Dehydration 02/22/2014  . AKI (acute kidney injury) (HCC) 02/22/2014  . Hypokalemia 02/04/2014  . Ventricular  tachycardia, polymorphic (HCC) 02/03/2014  . Headache 02/03/2014  . PTSD (post-traumatic stress disorder) 02/03/2014  . Implantable cardioverter-defibrillator (ICD) discharge 01/27/2014  . Sustained VT (ventricular tachycardia) (HCC) 01/27/2014  . ICD (implantable cardioverter-defibrillator), biventricular, in situ 01/23/2014  . Paroxysmal ventricular fibrillation (HCC) 01/19/2014  . Gout attack 04/24/2013  . Protein-calorie malnutrition, severe (HCC) 02/13/2013  . GERD (gastroesophageal reflux disease) 08/19/2012  . Chronic systolic heart failure (HCC) 01/25/2012  . Wide-complex tachycardia (HCC) 01/07/2012  . Cardiomyopathy (HCC) 12/07/2011  . Alcohol abuse 11/30/2011  . Marijuana abuse 11/30/2011  . Human immunodeficiency virus (HIV) disease (HCC) 06/09/2006  . GENITAL HERPES 06/09/2006  . Chronic hepatitis C without hepatic coma (HCC) 06/09/2006  . Essential hypertension 06/09/2006    Past Medical History: Past Medical History:  Diagnosis Date  . CHF (congestive heart failure) (HCC)   . Chronic systolic heart failure (HCC)    a. Echo 12/05/11:  EF 20-25%, diff HK with mild sparing of IL wall, mild AI, mod MR, mild LAE, mild RVE, mild reduced RVF.;  b.  Echo 05/11/2012:  Mild LVH, EF 20-25%, Gr 1 diast dysfn, mod MR, mild LAE  . Depression   . G6PD deficiency (HCC)   . GERD (gastroesophageal reflux disease)   . Hepatitis    Hep B and Hep C (patient does not report this but these are listed in previous notes.)  . HIV infection (HCC)   . Hypertension   .  NICM (nonischemic cardiomyopathy) (HCC)    cardia CTA 8/13 negative for obstructive CAD    Past Surgical History: Past Surgical History:  Procedure Laterality Date  . BI-VENTRICULAR PACEMAKER INSERTION N/A 06/26/2013   Procedure: BI-VENTRICULAR PACEMAKER INSERTION (CRT-P);  Surgeon: Marinus Maw, MD;  Location: [facility] CATH LAB;  Service: Cardiovascular;  Laterality: N/A;  . COLONOSCOPY WITH PROPOFOL N/A 08/14/2016   Procedure:  COLONOSCOPY WITH PROPOFOL;  Surgeon: Jeani Hawking, MD;  Location: [facility] ENDOSCOPY;  Service: Endoscopy;  Laterality: N/A;  . ELECTROPHYSIOLOGY STUDY N/A 02/19/2012   Procedure: ELECTROPHYSIOLOGY STUDY;  Surgeon: Marinus Maw, MD;  Location: [facility] CATH LAB;  Service: Cardiovascular;  Laterality: N/A;  . ESOPHAGOGASTRODUODENOSCOPY  12/07/2011   Procedure: ESOPHAGOGASTRODUODENOSCOPY (EGD);  Surgeon: [name], MD,FACG;  Location: [facility] ENDOSCOPY;  Service: Endoscopy;  Laterality: N/A;  . FINGER SURGERY     Thumb laceration.    Marland Kitchen LEFT HEART CATHETERIZATION WITH CORONARY ANGIOGRAM N/A 01/31/2014   Procedure: LEFT HEART CATHETERIZATION WITH CORONARY ANGIOGRAM;  Surgeon: Iran Ouch, MD;  Location: [facility] CATH LAB;  Service: Cardiovascular;  Laterality: N/A;    Social History: Social History  Substance Use Topics  . Smoking status: Never Smoker  . Smokeless tobacco: Never Used  . Alcohol use 0.6 oz/week    1 Cans of beer per week     Comment: occ beer    Please also refer to relevant sections of EMR.  Family History: Family History  Problem Relation Age of Onset  . Lung cancer Mother   . Colon cancer Unknown 50    Allergies and Medications: Allergies  Allergen Reactions  . Bactrim Rash   No current facility-administered medications on file prior to encounter.    Current Outpatient Prescriptions on File Prior to Encounter  Medication Sig Dispense Refill  . amiodarone (PACERONE) 200 MG tablet Take 1 tablet (200 mg total) by mouth daily. 30 tablet 11  . carvedilol (COREG) 25 MG tablet take 1 tablet by mouth twice a day with meals 60 tablet 3  . colchicine 0.6 MG tablet Take 1 tablet (0.6 mg total) by mouth 2 (two) times daily as needed. For gout flares. 30 tablet 2  . emtricitabine-rilpivir-tenofovir AF (ODEFSEY) 200-25-25 MG TABS tablet Take 1 tablet by mouth daily with breakfast. 30 tablet 5  . furosemide (LASIX) 40 MG tablet Take 40mg  twice daily as needed for fluid and weight gain 60  tablet 11  . hydrOXYzine (ATARAX/VISTARIL) 50 MG tablet take 1 tablet by mouth three times a day if needed 15 tablet 1  . LORazepam (ATIVAN) 1 MG tablet Take 1 tablet (1 mg total) by mouth at bedtime. 8 tablet 0  . mirtazapine (REMERON) 7.5 MG tablet Take 1 tablet (7.5 mg total) by mouth at bedtime. 30 tablet 5  . naproxen (NAPROSYN) 500 MG tablet Take 1 tablet (500 mg total) by mouth 2 (two) times daily. 30 tablet 0  . potassium chloride SA (K-DUR,KLOR-CON) 20 MEQ tablet Take 1 tablet by mouth twice daily when you take Lasix. 180 tablet 2  . sacubitril-valsartan (ENTRESTO) 97-103 MG Take 1 tablet by mouth 2 (two) times daily. 60 tablet 6  . spironolactone (ALDACTONE) 25 MG tablet take 1/2 tablet by mouth once daily 15 tablet 3  . TIVICAY 50 MG tablet take 1 tablet by mouth once daily WITH LUNCH 30 tablet 5  . valACYclovir (VALTREX) 1000 MG tablet Take 1 tablet (1,000 mg total) by mouth 2 (two) times daily as needed (outbreaks). Take for 5  days for an outbreak 10 tablet 0    Objective: BP 95/74   Pulse 76   Temp 97.9 F (36.6 C) (Oral)   Resp 16   Ht 5' 4.5" (1.638 m)   Wt 161 lb (73 kg)   SpO2 95%   BMI 27.21 kg/m  Exam: General: awake and alert, anxious about rash, sitting up in bed, alert and oriented  Eyes: slight scleral icterus, EOMI ENTM: rash within nostril Neck: soft, non tender, rash on neck  Cardiovascular: RRR, no MRG  Respiratory: CTAB, no wheezes, rales or rhonchi  Gastrointestinal: soft, non tender, slight distension, bowel sounds x 4 quadrants  MSK: no edema, full range of motion Derm: erythematous, circular, umbilicated rash throughout extremities, torso, back, ears, and nose.  Neuro: alert and oriented x 3 Psych: anxious   Labs and Imaging: CBC BMET   Recent Labs Lab 12/18/16 1136  WBC 10.4  HGB 12.2*  HCT 37.9*  PLT 133*    Recent Labs Lab 12/18/16 1221  NA 140  K 4.7  CL 108  CO2 18*  BUN 69*  CREATININE 4.37*  GLUCOSE 109*  CALCIUM 8.9      CMP     Component Value Date/Time   NA 140 12/18/2016 1221   K 4.7 12/18/2016 1221   CL 108 12/18/2016 1221   CO2 18 (L) 12/18/2016 1221   GLUCOSE 109 (H) 12/18/2016 1221   BUN 69 (H) 12/18/2016 1221   CREATININE 4.37 (H) 12/18/2016 1221   CREATININE 1.04 10/09/2015 1042   CALCIUM 8.9 12/18/2016 1221   PROT 7.2 12/18/2016 1221   ALBUMIN 2.9 (L) 12/18/2016 1221   AST 146 (H) 12/18/2016 1221   ALT 92 (H) 12/18/2016 1221   ALKPHOS 167 (H) 12/18/2016 1221   BILITOT 2.8 (H) 12/18/2016 1221   GFRNONAA 14 (L) 12/18/2016 1221   GFRNONAA 82 10/09/2015 1042   GFRAA 16 (L) 12/18/2016 1221   GFRAA >89 10/09/2015 1042     US Abdomen Limited Ruq  Result Date: 12/18/2016 CLINICAL DATA:  Initial evaluation for elevated liver enzymes. EXAM: ULTRASOUND ABDOMEN LIMITED RIGHT UPPER QUADRANT COMPARISON:  Prior CT from 01/19/2013. FINDINGS: Gallbladder: Single layering echogenic stone present within the gallbladder lumen measuring 9 x 6 x 7 mm. No internal sludge. Additional note made of a small polyp measuring 2 x 1 x 2 mm. This is felt to be incidental and of no clinical significance given size. Gallbladder wall measured within normal limits of 1 mm. No free pericholecystic fluid. No sonographic Murphy sign elicited on exam. Common bile duct: Diameter: 3 mm Liver: No focal lesion identified. Within normal limits in parenchymal echogenicity. IMPRESSION: 1. Cholelithiasis without sonographic features to suggest acute cholecystitis. No biliary dilatation. 2. Normal sonographic appearance of the liver. Electronically Signed   By: Rise Mu M.D.   On: 12/18/2016 15:02   Oralia Manis, DO 12/18/2016, 3:40 PM PGY-1, [facility] FPTS Intern pager: [phone], text pages welcome  UPPER LEVEL ADDENDUM  I have read the above note and made revisions highlighted in orange.  Tarri Abernethy, MD, MPH PGY-3 [facility] Pager [phone]

## 2016-12-18 NOTE — ED Provider Notes (Addendum)
[facility]-EMERGENCY DEPT Provider Note   CSN: [id number] Arrival date & time: 12/18/16  1054     History   Chief Complaint Chief Complaint  Patient presents with  . Hypotension    HPI [name] is a 51 y.o. male.  HPI   Patient is an HIV +51 year old male. Patient is chronically ill with severe heart failure followed by Dr. Adriana Reams with a implanted defibrillator, hep C, hypertension, cardiomyopathy, G6PD. He is presenting today hypotensive and weak. Patient had had rash since April. Patient has a diffuse rash that appears to have target-like lesion diffusely. He is followed by Dr. Donzetta Starch dermatology in [place]. He reports he had several attempt at therapy including prednisone, light therapy, abx, tx for scabies. He also states that he had a septic bursitis of the left elbow 3 weeks ago. He reports he was on clindamycin and it completely improved. He now only has tiny bit of pain in the left elbow.  Patient has not been febrile. HE has been increasingly weak. He reports 20 pound weight loss last month. He atributes it to stress from the rash.  Past Medical History:  Diagnosis Date  . CHF (congestive heart failure) (HCC)   . Chronic systolic heart failure (HCC)    a. Echo 12/05/11:  EF 20-25%, diff HK with mild sparing of IL wall, mild AI, mod MR, mild LAE, mild RVE, mild reduced RVF.;  b.  Echo 05/11/2012:  Mild LVH, EF 20-25%, Gr 1 diast dysfn, mod MR, mild LAE  . Depression   . G6PD deficiency (HCC)   . GERD (gastroesophageal reflux disease)   . Hepatitis    Hep B and Hep C (patient does not report this but these are listed in previous notes.)  . HIV infection (HCC)   . Hypertension   . NICM (nonischemic cardiomyopathy) (HCC)    cardia CTA 8/13 negative for obstructive CAD    Patient Active Problem List   Diagnosis Date Noted  . Scabies 09/17/2016  . Screening examination for venereal disease 03/21/2015  . Encounter for long-term (current) use of medications  03/21/2015  . Pain in joint, ankle and foot 03/21/2015  . Insomnia 06/26/2014  . Dehydration 02/22/2014  . AKI (acute kidney injury) (HCC) 02/22/2014  . Hypokalemia 02/04/2014  . Ventricular tachycardia, polymorphic (HCC) 02/03/2014  . Headache 02/03/2014  . PTSD (post-traumatic stress disorder) 02/03/2014  . Implantable cardioverter-defibrillator (ICD) discharge 01/27/2014  . Sustained VT (ventricular tachycardia) (HCC) 01/27/2014  . ICD (implantable cardioverter-defibrillator), biventricular, in situ 01/23/2014  . Paroxysmal ventricular fibrillation (HCC) 01/19/2014  . Gout attack 04/24/2013  . Protein-calorie malnutrition, severe (HCC) 02/13/2013  . GERD (gastroesophageal reflux disease) 08/19/2012  . Chronic systolic heart failure (HCC) 01/25/2012  . Wide-complex tachycardia (HCC) 01/07/2012  . Cardiomyopathy (HCC) 12/07/2011  . Alcohol abuse 11/30/2011  . Marijuana abuse 11/30/2011  . Human immunodeficiency virus (HIV) disease (HCC) 06/09/2006  . GENITAL HERPES 06/09/2006  . Chronic hepatitis C without hepatic coma (HCC) 06/09/2006  . Essential hypertension 06/09/2006    Past Surgical History:  Procedure Laterality Date  . BI-VENTRICULAR PACEMAKER INSERTION N/A 06/26/2013   Procedure: BI-VENTRICULAR PACEMAKER INSERTION (CRT-P);  Surgeon: Marinus Maw, MD;  Location: [facility] CATH LAB;  Service: Cardiovascular;  Laterality: N/A;  . COLONOSCOPY WITH PROPOFOL N/A 08/14/2016   Procedure: COLONOSCOPY WITH PROPOFOL;  Surgeon: Jeani Hawking, MD;  Location: [facility] ENDOSCOPY;  Service: Endoscopy;  Laterality: N/A;  . ELECTROPHYSIOLOGY STUDY N/A 02/19/2012   Procedure: ELECTROPHYSIOLOGY STUDY;  Surgeon: Doylene Canning  Ladona Ridgel, MD;  Location: [facility] CATH LAB;  Service: Cardiovascular;  Laterality: N/A;  . ESOPHAGOGASTRODUODENOSCOPY  12/07/2011   Procedure: ESOPHAGOGASTRODUODENOSCOPY (EGD);  Surgeon: [name], MD,FACG;  Location: [facility] ENDOSCOPY;  Service: Endoscopy;  Laterality: N/A;  . FINGER SURGERY      Thumb laceration.    Marland Kitchen LEFT HEART CATHETERIZATION WITH CORONARY ANGIOGRAM N/A 01/31/2014   Procedure: LEFT HEART CATHETERIZATION WITH CORONARY ANGIOGRAM;  Surgeon: Iran Ouch, MD;  Location: [facility] CATH LAB;  Service: Cardiovascular;  Laterality: N/A;       Home Medications    Prior to Admission medications   Medication Sig Start Date End Date Taking? Authorizing Provider  amiodarone (PACERONE) 200 MG tablet Take 1 tablet (200 mg total) by mouth daily. 04/15/16  Yes Marinus Maw, MD  carvedilol (COREG) 25 MG tablet take 1 tablet by mouth twice a day with meals 08/10/16  Yes Bensimhon, Bevelyn Buckles, MD  colchicine 0.6 MG tablet Take 1 tablet (0.6 mg total) by mouth 2 (two) times daily as needed. For gout flares. 12/25/14  Yes Comer, [name], MD  emtricitabine-rilpivir-tenofovir AF (ODEFSEY) 200-25-25 MG TABS tablet Take 1 tablet by mouth daily with breakfast. 09/11/16  Yes Comer, [name], MD  furosemide (LASIX) 40 MG tablet Take 40mg  twice daily as needed for fluid and weight gain 10/20/16 10/21/18 Yes Bensimhon, Bevelyn Buckles, MD  hydrOXYzine (ATARAX/VISTARIL) 50 MG tablet take 1 tablet by mouth three times a day if needed 11/04/16  Yes Upstill, Shari, PA-C  LORazepam (ATIVAN) 1 MG tablet Take 1 tablet (1 mg total) by mouth at bedtime. 11/04/16  Yes Upstill, Melvenia Beam, PA-C  mirtazapine (REMERON) 7.5 MG tablet Take 1 tablet (7.5 mg total) by mouth at bedtime. 07/02/16  Yes Comer, [name], MD  naproxen (NAPROSYN) 500 MG tablet Take 1 tablet (500 mg total) by mouth 2 (two) times daily. 02/28/16  Yes Neva Seat, Tiffany, PA-C  potassium chloride SA (K-DUR,KLOR-CON) 20 MEQ tablet Take 1 tablet by mouth twice daily when you take Lasix. 10/20/16  Yes Bensimhon, Bevelyn Buckles, MD  sacubitril-valsartan (ENTRESTO) 97-103 MG Take 1 tablet by mouth 2 (two) times daily. 01/07/16  Yes Bensimhon, Bevelyn Buckles, MD  spironolactone (ALDACTONE) 25 MG tablet take 1/2 tablet by mouth once daily 08/10/16  Yes Bensimhon, Bevelyn Buckles, MD  TIVICAY 50 MG  tablet take 1 tablet by mouth once daily WITH LUNCH 09/11/16  Yes Comer, [name], MD  valACYclovir (VALTREX) 1000 MG tablet Take 1 tablet (1,000 mg total) by mouth 2 (two) times daily as needed (outbreaks). Take for 5 days for an outbreak 02/22/14  Yes Bensimhon, Bevelyn Buckles, MD    Family History Family History  Problem Relation Age of Onset  . Lung cancer Mother   . Colon cancer Unknown 21    Social History Social History  Substance Use Topics  . Smoking status: Never Smoker  . Smokeless tobacco: Never Used  . Alcohol use 0.6 oz/week    1 Cans of beer per week     Comment: occ beer     Allergies   Bactrim   Review of Systems Review of Systems  Constitutional: Positive for activity change, appetite change and fatigue. Negative for fever.  Respiratory: Negative for shortness of breath.   Cardiovascular: Negative for chest pain.  Gastrointestinal: Positive for vomiting. Negative for abdominal pain.  Endocrine: Positive for cold intolerance.     Physical Exam Updated Vital Signs BP 95/74   Pulse 76   Temp 97.9 F (36.6  C) (Oral)   Resp (!) 26   Ht 5' 4.5" (1.638 m)   Wt 73 kg (161 lb)   SpO2 95%   BMI 27.21 kg/m   Physical Exam  Constitutional: He is oriented to person, place, and time. He appears well-nourished. No distress.  HENT:  Head: Normocephalic and atraumatic.  Mouth/Throat: Oropharynx is clear and moist.  Eyes: Conjunctivae are normal. Right eye exhibits no discharge. Left eye exhibits no discharge.  Cardiovascular: Normal rate and regular rhythm.   Pulmonary/Chest: Effort normal and breath sounds normal. No respiratory distress. He has no wheezes.  Abdominal: Soft. He exhibits no distension. There is no tenderness.  Musculoskeletal:  L elbow, small amount of swelling, no erythema.  Neurological: He is oriented to person, place, and time.  Skin: Skin is warm and dry. He is not diaphoretic.  Target like lesions diffusely.  Psychiatric: He has a normal  mood and affect. His behavior is normal.     ED Treatments / Results  Labs (all labs ordered are listed, but only abnormal results are displayed) Labs Reviewed  CBC WITH DIFFERENTIAL/PLATELET - Abnormal; Notable for the following:       Result Value   RBC 4.21 (*)    Hemoglobin 12.2 (*)    HCT 37.9 (*)    RDW 16.5 (*)    Platelets 133 (*)    All other components within normal limits  COMPREHENSIVE METABOLIC PANEL - Abnormal; Notable for the following:    CO2 18 (*)    Glucose, Bld 109 (*)    BUN 69 (*)    Creatinine, Ser 4.37 (*)    Albumin 2.9 (*)    AST 146 (*)    ALT 92 (*)    Alkaline Phosphatase 167 (*)    Total Bilirubin 2.8 (*)    GFR calc non Af Amer 14 (*)    GFR calc Af Amer 16 (*)    All other components within normal limits  CULTURE, BLOOD (ROUTINE X 2)  CULTURE, BLOOD (ROUTINE X 2)  BRAIN NATRIURETIC PEPTIDE  LIPASE, BLOOD  I-STAT TROPONIN, ED  I-STAT CG4 LACTIC ACID, ED  I-STAT CG4 LACTIC ACID, ED    EKG  EKG Interpretation None       Radiology US Abdomen Limited Ruq  Result Date: 12/18/2016 CLINICAL DATA:  Initial evaluation for elevated liver enzymes. EXAM: ULTRASOUND ABDOMEN LIMITED RIGHT UPPER QUADRANT COMPARISON:  Prior CT from 01/19/2013. FINDINGS: Gallbladder: Single layering echogenic stone present within the gallbladder lumen measuring 9 x 6 x 7 mm. No internal sludge. Additional note made of a small polyp measuring 2 x 1 x 2 mm. This is felt to be incidental and of no clinical significance given size. Gallbladder wall measured within normal limits of 1 mm. No free pericholecystic fluid. No sonographic Murphy sign elicited on exam. Common bile duct: Diameter: 3 mm Liver: No focal lesion identified. Within normal limits in parenchymal echogenicity. IMPRESSION: 1. Cholelithiasis without sonographic features to suggest acute cholecystitis. No biliary dilatation. 2. Normal sonographic appearance of the liver. Electronically Signed   By: Rise Mu M.D.   On: 12/18/2016 15:02    Procedures Procedures (including critical care time)  Medications Ordered in ED Medications  piperacillin-tazobactam (ZOSYN) IVPB 3.375 g (not administered)  vancomycin (VANCOCIN) IVPB 1000 mg/200 mL premix (not administered)  piperacillin-tazobactam (ZOSYN) IVPB 2.25 g (not administered)  hydrOXYzine (ATARAX/VISTARIL) tablet 50 mg (not administered)  sodium chloride 0.9 % bolus 1,000 mL (1,000 mLs Intravenous New Bag/Given  12/18/16 1145)  sodium chloride 0.9 % bolus 500 mL (500 mLs Intravenous New Bag/Given 12/18/16 1254)     Initial Impression / Assessment and Plan / ED Course  I have reviewed the triage vital signs and the nursing notes.  Pertinent labs & imaging results that were available during my care of the patient were reviewed by me and considered in my medical decision making (see chart for details).     Patient is an HIV +51 year old male. Patient is chronically ill with severe heart failure followed by Dr. Adriana Reams with a implanted defibrillator, hep C, hypertension, cardiomyopathy, G6PD.  Unclear what this rash is. We will call Dr. Yetta Barre to get more information. . In addition we'll treat patient's hypotention gently first given his severe decreased EF of 20%. Sepsis labs ordered. However patient is not febrile tachycardic.  3:10 PM Patient is still mildly hypotensive after a liter and a half. Patient's liver enzymes are abnormal as they have been in the past,  however now alkaline phosphatase and T bili are affected. We'll order a right upper quadrant ultrasound. We'll give broad sepctrum antibiotics. Discussed with dermatology and do not believe that this illness is secondary to his chronic kin findings.  Will admit for further workup.  \ CRITICAL CARE Performed by: Arlana Hove Total critical care time: 45 minutes Critical care time was exclusive of separately billable procedures and treating other patients. Critical  care was necessary to treat or prevent imminent or life-threatening deterioration. Critical care was time spent personally by me on the following activities: development of treatment plan with patient and/or surrogate as well as nursing, discussions with consultants, evaluation of patient's response to treatment, examination of patient, obtaining history from patient or surrogate, ordering and performing treatments and interventions, ordering and review of laboratory studies, ordering and review of radiographic studies, pulse oximetry and re-evaluation of patient's condition.   Final Clinical Impressions(s) / ED Diagnoses   Final diagnoses:  Elevated liver enzymes    New Prescriptions New Prescriptions   No medications on file     Abelino Derrick, MD 12/18/16 1510    Abelino Derrick, MD 12/18/16 1510    Abelino Derrick, MD 12/18/16 1511

## 2016-12-19 DIAGNOSIS — I952 Hypotension due to drugs: Secondary | ICD-10-CM | POA: Diagnosis not present

## 2016-12-19 DIAGNOSIS — Z21 Asymptomatic human immunodeficiency virus [HIV] infection status: Secondary | ICD-10-CM | POA: Diagnosis not present

## 2016-12-19 DIAGNOSIS — N289 Disorder of kidney and ureter, unspecified: Secondary | ICD-10-CM | POA: Diagnosis not present

## 2016-12-19 DIAGNOSIS — I959 Hypotension, unspecified: Secondary | ICD-10-CM | POA: Diagnosis not present

## 2016-12-19 DIAGNOSIS — N179 Acute kidney failure, unspecified: Secondary | ICD-10-CM | POA: Diagnosis not present

## 2016-12-19 DIAGNOSIS — B182 Chronic viral hepatitis C: Secondary | ICD-10-CM | POA: Diagnosis not present

## 2016-12-19 DIAGNOSIS — Z801 Family history of malignant neoplasm of trachea, bronchus and lung: Secondary | ICD-10-CM

## 2016-12-19 DIAGNOSIS — Z79899 Other long term (current) drug therapy: Secondary | ICD-10-CM | POA: Diagnosis not present

## 2016-12-19 DIAGNOSIS — R21 Rash and other nonspecific skin eruption: Secondary | ICD-10-CM

## 2016-12-19 DIAGNOSIS — R748 Abnormal levels of other serum enzymes: Secondary | ICD-10-CM | POA: Diagnosis not present

## 2016-12-19 DIAGNOSIS — Z800 Family history of malignant neoplasm of digestive organs: Secondary | ICD-10-CM

## 2016-12-19 DIAGNOSIS — Z882 Allergy status to sulfonamides status: Secondary | ICD-10-CM

## 2016-12-19 LAB — COMPREHENSIVE METABOLIC PANEL
ALT: 77 U/L — AB (ref 17–63)
AST: 120 U/L — AB (ref 15–41)
Albumin: 2.4 g/dL — ABNORMAL LOW (ref 3.5–5.0)
Alkaline Phosphatase: 142 U/L — ABNORMAL HIGH (ref 38–126)
Anion gap: 5 (ref 5–15)
BILIRUBIN TOTAL: 3 mg/dL — AB (ref 0.3–1.2)
BUN: 46 mg/dL — AB (ref 6–20)
CALCIUM: 8.4 mg/dL — AB (ref 8.9–10.3)
CHLORIDE: 112 mmol/L — AB (ref 101–111)
CO2: 22 mmol/L (ref 22–32)
CREATININE: 2.52 mg/dL — AB (ref 0.61–1.24)
GFR, EST AFRICAN AMERICAN: 32 mL/min — AB (ref >60)
GFR, EST NON AFRICAN AMERICAN: 28 mL/min — AB (ref >60)
Glucose, Bld: 90 mg/dL (ref 65–99)
Potassium: 5.1 mmol/L (ref 3.5–5.1)
Sodium: 139 mmol/L (ref 135–145)
TOTAL PROTEIN: 6.2 g/dL — AB (ref 6.5–8.1)

## 2016-12-19 LAB — CBC
HEMATOCRIT: 32.9 % — AB (ref 39.0–52.0)
Hemoglobin: 10.2 g/dL — ABNORMAL LOW (ref 13.0–17.0)
MCH: 28.1 pg (ref 26.0–34.0)
MCHC: 31 g/dL (ref 30.0–36.0)
MCV: 90.6 fL (ref 78.0–100.0)
PLATELETS: 121 10*3/uL — AB (ref 150–400)
RBC: 3.63 MIL/uL — AB (ref 4.22–5.81)
RDW: 16.4 % — AB (ref 11.5–15.5)
WBC: 8.2 10*3/uL (ref 4.0–10.5)

## 2016-12-19 NOTE — Consult Note (Signed)
[facility]       Reason for Consult: rash, HIV    Referring Physician: Dr. Lum Babe  Active Problems:   Hypotension   Skin rash   Elevated liver enzymes   . amiodarone  200 mg Oral Daily  . dolutegravir  50 mg Oral Q lunch  . emtricitabine-rilpivir-tenofovir AF  1 tablet Oral Q breakfast  . feeding supplement (ENSURE ENLIVE)  237 mL Oral BID BM  . folic acid  1 mg Oral Daily  . heparin  5,000 Units Subcutaneous Q8H  . LORazepam  1 mg Oral QHS  . multivitamin with minerals  1 tablet Oral Daily  . thiamine  100 mg Oral Daily   Or  . thiamine  100 mg Intravenous Daily    Recommendations: Consider referral to [facility] dermatology for a second opinion   Assessment: He has a rash which has been present for about 3 months of unknown etiology.  He has seen dermatology and biopsy unrevealing according to the patient.  He is frustrated and wants a definitive diagnosis now, which I explained we will not have.  I recommended a second opinion as above.  I do not know what the rash is.    I again have removed myself from being listed as his primary care physician.  I have again explained to the patient that I am not a PCP for anyone and am not his PCP and emphasized the need for him to get one.    Renal insufficiency - his creat has increased since I last saw him, but improved here with fluids.  Ok to continue his current ARVs and will continue to follow him in clinic and consider changes as needed.    Hepatitis C - he has active chronic hepatitis C and did not want treatment previously and now is on amiodarone which is an absolute contraindication to all of the medications.  I will treat him if/when that changes.    HIV - as above, no changes to his ARVs.    I will not follow up, thanks for consultation.    Antibiotics: Received vancomycin and zosyn x 1 dose  HPI: [name] is a 51 y.o. male known to me with HIV well controlled on Odefsey and  Tivicay who came in due to hypotension noted at his cardiology appt.  He was given fluids in the ED and did not respond sufficiently so he was admitted for further fluids.  His BP has normalized.  He is adamant that he wants a diagnosis of his rash before he leaves.  No fever, no chills.  Some weight loss.    Review of Systems:  Constitutional: negative for fevers and chills Respiratory: negative for cough or sputum Gastrointestinal: negative for nausea and diarrhea All other systems reviewed and are negative    Past Medical History:  Diagnosis Date  . CHF (congestive heart failure) (HCC)   . Chronic systolic heart failure (HCC)    a. Echo 12/05/11:  EF 20-25%, diff HK with mild sparing of IL wall, mild AI, mod MR, mild LAE, mild RVE, mild reduced RVF.;  b.  Echo 05/11/2012:  Mild LVH, EF 20-25%, Gr 1 diast dysfn, mod MR, mild LAE  . Depression   . G6PD deficiency (HCC)   . GERD (gastroesophageal reflux disease)   . Hepatitis    Hep B and Hep C (patient does not report this but these are listed in previous notes.)  . HIV  infection (HCC)   . Hypertension   . NICM (nonischemic cardiomyopathy) (HCC)    cardia CTA 8/13 negative for obstructive CAD    Social History  Substance Use Topics  . Smoking status: Never Smoker  . Smokeless tobacco: Never Used  . Alcohol use 0.6 oz/week    1 Cans of beer per week     Comment: occ beer    Family History  Problem Relation Age of Onset  . Lung cancer Mother   . Colon cancer Unknown 50    Allergies  Allergen Reactions  . Bactrim Rash    Physical Exam: Constitutional: in no apparent distress  Vitals:   12/19/16 0446 12/19/16 0943  BP: 110/80 93/60  Pulse: 75 72  Resp: 16 20  Temp: 98.6 F (37 C) 98.5 F (36.9 C)   EYES: anicteric ENMT: no thrush Cardiovascular: Cor RRR Respiratory: CTA B; normal respiratory effort GI: Bowel sounds are normal, liver is not enlarged, spleen is not enlarged Musculoskeletal: no pedal edema  noted Skin: diffuse rash  Lab Results  Component Value Date   WBC 8.2 12/19/2016   HGB 10.2 (L) 12/19/2016   HCT 32.9 (L) 12/19/2016   MCV 90.6 12/19/2016   PLT 121 (L) 12/19/2016    Lab Results  Component Value Date   CREATININE 2.52 (H) 12/19/2016   BUN 46 (H) 12/19/2016   NA 139 12/19/2016   K 5.1 12/19/2016   CL 112 (H) 12/19/2016   CO2 22 12/19/2016    Lab Results  Component Value Date   ALT 77 (H) 12/19/2016   AST 120 (H) 12/19/2016   ALKPHOS 142 (H) 12/19/2016     Microbiology: No results found for this or any previous visit (from the past 240 hour(s)).  [name], MD [facility] [email or web address] [phone] pager  [phone] cell 12/19/2016, 3:11 PM

## 2016-12-19 NOTE — Consult Note (Signed)
WOC Nurse wound consult note Reason for Consult: All-body rash with partial and full thickness presentation.  All wounds are circular. Patient has been followed by Dr. Donzetta Starch [facility]) and has failed treatment for scabies, UV light therapy, topical steroids, oral steroids and reports he has had an inconclusive biopsy within the last 4 weeks..  Wound type: Infectious vs autoimmune I have nothing to add to this patient's POC, given the active involvement of Dermatology as an outpatient with no improvement. 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 Patient provided with support and empathy. WOC nursing team will not follow, but will remain available to this patient, the nursing and medical teams.  Please re-consult if needed. Thanks, Ladona Mow, MSN, RN, GNP, [place]  Pager# [phone]

## 2016-12-19 NOTE — Progress Notes (Signed)
Family Medicine Teaching Service Daily Progress Note Intern Pager: [phone]  Patient name: [name] Medical record number: [id number] Date of birth: 03/23/1966 Age: 51 y.o. Gender: male  Primary Care Provider: [name], MD Consultants: ID  Code Status: Full   Pt Overview and Major Events to Date:  Admit 7/20  Assessment and Plan: [name] is a 51 y.o. male presenting with hypotension and rash . PMH is significant for HIV, genital herpes, hepatitis C, essential HTN, alcohol abuse, marijuana abuse, marijuana abuse, cardiomyopathy, systolic heart failure, GERD, gout, paroxysmal v fib, ICD defibrillator placement, ventricular tachycardia, and PTSD.   Hypotension Improving.  BP this AM 110/80. On admission, 74/54 in the ED and was given 1.5 L of fluid.  Hypotension likely due to dehydration as patient reported weight loss and dry mouth on exam. Also reporting recent history of ~2 weeks of diarrhea after completing course of antibiotics.  -continue IVF NS @ 125 mL/hr -Monitor blood pressures   Rash Erythematous, circular, umbilicated rash throughout extremities, torso, back, ears, and nose. Patient describes as pruritic and painful, hydroxyzine has been helping.  Possible etiologies include HIV sequelae infection or molluscum contagiosum. Unlikely scabies due to lack of response to treatment and no scabies seen on biopsy at dermatologist.  -RPR -Continue hydroxyzine 25 mg q4 hrs prn for itching  -ID consulted, appreciate recs   Diarrhea.  Has had 3 additional loose stools this morning.  In setting of diarrhea after recently completing a course of antibiotics, will check for C diff.  -C-diff with PCR reflex  -Enteric precautions   Elevated liver enzymes  Liver enzymes elevated on admission but are downtrending with AST of 146>120, ALT of 92>77. Slight scleral icterus on exam. Also with history of Hep C and current alcohol abuse (see below).  -continue to monitor  -  AM CMP  AKI Cr of 4.37 on admission improved to 2.52 this morning. With baseline ~ 1.0. Has been taking lasix daily. Suspect secondary to dehydration and Lasix usage.  -d/c Lasix  -IVFs as above  -daily BMET  -continue to monitor  Alcohol abuse Per nurse, patient drinks a 6 pack a day and last drink was 3 days ago. -monitor on CIWA protocol > scores: 4  Systolic heart failure  Per chart review, due to nonischemic cardiomyopathy likely due to alcohol abuse. Echo in 12/17 showing EF of 55%. Patient appeared euvolemic on exam. Patient was discontinued on lasix, coreg, spironolactone, and entresto at today's visit with Dr. Shirlee Latch.  -monitor fluid status -consider heart failure consult   HTN Current BP of 117/79. Patient was discontinued on lasix, coreg, spironolactone, and entresto at today's visit with Dr. Shirlee Latch. Patient was hypotensive in ED. -continue to monitor  -hold home medications while hypotensive  HIV Monitored by ID as outpatient. CD4 count on 07/02/2106 of 23 and VL undetectable. Followed outpatient by Dr. Luciana Axe. Last appt reportedly two weeks prior to admission.  -continue home antivirals   Arrhythmia Controlled by ICD/defibrillator -continue home medications   FEN/GI: heart healthy diet PPx: Heparin   Disposition: Dispo pending clinical improvement   Subjective:  Pt states he feels well this morning.  Felt anxious and frustrated last night due to his rash which has been present x 3 months.  Denies SI/HI.  Hydroxyzine has been helping with his itching.  Complaining of left elbow pain. Denies falls/trauma to the arm.  Has had 3 additional loose stools this morning, nonbloody.  No other concerns at this time.  Objective: Temp:  [97.9 F (36.6 C)-98.6 F (37 C)] 98.5 F (36.9 C) (07/21 0943) Pulse Rate:  [43-96] 72 (07/21 0943) Resp:  [13-26] 20 (07/21 0943) BP: (65-111)/(45-87) 93/60 (07/21 0943) SpO2:  [95 %-100 %] 98 % (07/21 0943) Weight:  [161 lb (73  kg)] 161 lb (73 kg) (07/20 1103)   Physical Exam: General: pleasant 51 yo M, laying in hospital bed, in NAD  Cardiovascular: RRR no MRG  Respiratory: CTAB, WOB is comfortable  Abdomen: soft, NTND, +bs  Skin: numerous erythematous, circular, umbilicated rash throughout extremities, torso, back, ears, and nose Extremities: normal ROM  Psych: normal mood and affect  Laboratory:  Recent Labs Lab 12/18/16 1136 12/19/16 0408  WBC 10.4 8.2  HGB 12.2* 10.2*  HCT 37.9* 32.9*  PLT 133* 121*    Recent Labs Lab 12/18/16 1221 12/19/16 0408  NA 140 139  K 4.7 5.1  CL 108 112*  CO2 18* 22  BUN 69* 46*  CREATININE 4.37* 2.52*  CALCIUM 8.9 8.4*  PROT 7.2 6.2*  BILITOT 2.8* 3.0*  ALKPHOS 167* 142*  ALT 92* 77*  AST 146* 120*  GLUCOSE 109* 90   Imaging/Diagnostic Tests: US Abdomen Limited Ruq  Result Date: 12/18/2016 CLINICAL DATA:  Initial evaluation for elevated liver enzymes. EXAM: ULTRASOUND ABDOMEN LIMITED RIGHT UPPER QUADRANT COMPARISON:  Prior CT from 01/19/2013. FINDINGS: Gallbladder: Single layering echogenic stone present within the gallbladder lumen measuring 9 x 6 x 7 mm. No internal sludge. Additional note made of a small polyp measuring 2 x 1 x 2 mm. This is felt to be incidental and of no clinical significance given size. Gallbladder wall measured within normal limits of 1 mm. No free pericholecystic fluid. No sonographic Murphy sign elicited on exam. Common bile duct: Diameter: 3 mm Liver: No focal lesion identified. Within normal limits in parenchymal echogenicity. IMPRESSION: 1. Cholelithiasis without sonographic features to suggest acute cholecystitis. No biliary dilatation. 2. Normal sonographic appearance of the liver. Electronically Signed   By: Rise Mu M.D.   On: 12/18/2016 15:02   Freddrick March, MD 12/19/2016, 10:47 AM PGY-2, [facility] FPTS Intern pager: [phone], text pages welcome

## 2016-12-19 NOTE — Progress Notes (Signed)
Received page from RN stating pt would like to go home as he feels nothing is being done for him here.  I explained to the pt that we would like for him to stay as his blood pressures were initially low and AKI is still improving with fluids.  Pt adamant to leave and wanted to restart his Entresto.  I informed him  that we will be holding his Lasix, Entresto, Coreg and Spironolactone on discharge and for Dr. Shirlee Latch to restart as outpatient when he follows up this week.  Also discussed with Dr. Elease Hashimoto [facility]) and per cardiology, okay to discharge as his systolic BPs have improved from admission.    Patient instructed to follow up with [facility] this week as well as [facility] for second opinion for his skin rash.    David March, MD  PGY-2 12/19/2016

## 2016-12-19 NOTE — Discharge Instructions (Signed)
Please stop taking the following medications on discharge as your blood pressures are still low: Lasix, Entresto, Coreg and Spironolactone.  You will need to follow up with [facility] this week and Dr. Shirlee Latch will restart these medications as needed.    Additionally, you were advised to obtain a second opinion at [facility] for your skin rash.  I have provided you with the phone number below.    Address: [address] [place], [place] [address] Phone: [phone]

## 2016-12-19 NOTE — Progress Notes (Signed)
Discussed discharge instructions and medications with patient.  All questions answered. 

## 2016-12-21 ENCOUNTER — Encounter (HOSPITAL_COMMUNITY): Payer: Self-pay | Admitting: *Deleted

## 2016-12-21 ENCOUNTER — Telehealth (HOSPITAL_COMMUNITY): Payer: Self-pay | Admitting: *Deleted

## 2016-12-21 LAB — T-HELPER CELLS (CD4) COUNT (NOT AT ARMC)
CD4 % Helper T Cell: 21 % — ABNORMAL LOW (ref 33–55)
CD4 T CELL ABS: 510 /uL (ref 400–2700)

## 2016-12-21 NOTE — Telephone Encounter (Signed)
Patient called in stating he was discharged from hospital on Saturday and that he was to hold all heart medications until seen in our clinic this week.  I spoke with Otilio Saber, PA and he wants patient to come in for BP check and BMET today and have Dr. Shirlee Latch review and decided on which medications needed to be restarted.    I called patient back and he is unable to come in until tomorrow morning due to transportation.  Appointment scheduled, patient is aware.

## 2016-12-22 ENCOUNTER — Ambulatory Visit (HOSPITAL_COMMUNITY)
Admission: RE | Admit: 2016-12-22 | Discharge: 2016-12-22 | Disposition: A | Discharge status: Home / Self Care | Payer: Medicare Other | Source: Ambulatory Visit | Attending: Cardiology | Admitting: Cardiology

## 2016-12-22 ENCOUNTER — Encounter (HOSPITAL_COMMUNITY): Payer: Self-pay

## 2016-12-22 VITALS — BP 128/68 | HR 77 | Wt 161.0 lb

## 2016-12-22 DIAGNOSIS — L281 Prurigo nodularis: Secondary | ICD-10-CM | POA: Diagnosis not present

## 2016-12-22 DIAGNOSIS — I5022 Chronic systolic (congestive) heart failure: Secondary | ICD-10-CM | POA: Insufficient documentation

## 2016-12-22 LAB — BASIC METABOLIC PANEL
Anion gap: 6 (ref 5–15)
BUN: 13 mg/dL (ref 6–20)
CALCIUM: 8.9 mg/dL (ref 8.9–10.3)
CO2: 22 mmol/L (ref 22–32)
Chloride: 114 mmol/L — ABNORMAL HIGH (ref 101–111)
Creatinine, Ser: 1.14 mg/dL (ref 0.61–1.24)
GFR calc Af Amer: >60 mL/min (ref >60)
GLUCOSE: 95 mg/dL (ref 65–99)
POTASSIUM: 4.3 mmol/L (ref 3.5–5.1)
Sodium: 142 mmol/L (ref 135–145)

## 2016-12-22 NOTE — Discharge Summary (Signed)
Family Medicine Teaching [facility] Discharge Summary  Patient name: [name] Medical record number: [id number] Date of birth: 1965-09-12 Age: 51 y.o. Gender: male Date of Admission: 12/18/2016  Date of Discharge: 12/19/2016  Admitting Physician: Doreene Eland, MD  Primary Care Provider: No primary care provider on file. Consultants: ID    Indication for Hospitalization: Hypotension   Discharge Diagnoses/Problem List:  Hypotension Diarrhea Rash  Elevated LFTs AKI Alcohol abuse CHF HTN HIV  Disposition: Home   Discharge Condition: Stable   Discharge Exam:  General: pleasant 51 yo M, laying in hospital bed, in NAD  Cardiovascular: RRR no MRG  Respiratory: CTAB, WOB is comfortable  Abdomen: soft, NTND, +bs  Skin: numerous erythematous, circular, umbilicated rash throughout extremities, torso, back, ears, and nose Extremities: normal ROM  Psych: normal mood and affect  Brief Hospital Course:  [name] is a 51 yo M presenting with hypotension and a rash x 3 months duration.  PMH significant for HIV, genital herpes, hepatitis C, essential HTN, alcohol abuse, marijuana abuse, cardiomyopathy, systolic HF, GERD, gout, paroxysmal v-fib, ICD defibrillator placement, and PTSD.    Pt was seen in his Cardiologist (Dr. Alford Highland) office on 7/20 the day of admission for profound weakness and lightheadedness with standing. His BP was 74/54 sitting and dropped to systolic 60 with standing.  He had also been reporting multiple episodes of watery diarrhea.  He was sent to the ED and received 1.5 L NS with improvement in BP to 111/79.   Pt's hypotension likely suspected to be due to dehydration as he reported h/o 2 weeks of diarrhea after completing a course of antibiotics.  IVFs were continued.  Pt's liver enzymes elevated to AST of 146 and ALT of 92 which improved the following day to AST of 120 and ALT of 77.  Additionally with AKI on admission with SCr of 4.37 which improved  with fluids to 2.52 the following day.  His blood pressure and heart failure medications were held on admission in setting of his hypotension and per recommendations of Dr. Shirlee Latch.    Pt also with rash of 3 months duration which he has been seeing Dermatology for.  Per patient he had a biopsy performed which was also unrevealing.  ID was consulted during this hospitalization and recommended follow up at [facility] for second opinion.   Patient expressed a lot of frustration over not having a definitive diagnosis regarding his rash and wanted to leave the hospital.  Additionally, wanting to restart his Sherryll Burger as he had been taking this for several years.  Expressed to the patient that his heart failure medications were being held due to low blood pressures and that these would be restarted by his cardiologist as outpatient at a later date.  He expressed understanding but was adamant about wanting to leave the hospital.  Blood pressure on discharge was 110/80.   Discussed with Dr. Elease Hashimoto of [facility] who agreed with holding heart failure medications on discharge with plan to follow up later this week. Pt expressed good understanding of this and was also advised to follow up with [facility] for a second opinion.    Issues for Follow Up:  1. Pt with hypotension on this admission.  Coreg, Entresto, Lasix and Spironolactone were held per Dr. Shirlee Latch (Cardiologist) with plan to restart once BPs tolerate.   2. Please check BMET at follow up visit.  Pt with AKI  - SCr of 4.37 on admission which improved to 2.52  on discharge.  Pt wished to leave prior to optimal blood pressure control and resolution of his AKI.   3. Rash x 3 months duration.  Per patient, he has seen Dermatology for this as well as had a biopsy without clear diagnosis.  He was seen by ID during hospitalization and advised to follow up with [facility] for second opinion.  May need referral as outpatient.    Significant Procedures:  None   Significant Labs and Imaging:   Recent Labs Lab 12/18/16 1136 12/19/16 0408  WBC 10.4 8.2  HGB 12.2* 10.2*  HCT 37.9* 32.9*  PLT 133* 121*    Recent Labs Lab 12/18/16 1221 12/19/16 0408  NA 140 139  K 4.7 5.1  CL 108 112*  CO2 18* 22  GLUCOSE 109* 90  BUN 69* 46*  CREATININE 4.37* 2.52*  CALCIUM 8.9 8.4*  ALKPHOS 167* 142*  AST 146* 120*  ALT 92* 77*  ALBUMIN 2.9* 2.4*   Results/Tests Pending at Time of Discharge: None  Discharge Medications:  Allergies as of 12/19/2016      Reactions   Bactrim Rash      Medication List    STOP taking these medications   carvedilol 25 MG tablet Commonly known as:  COREG   furosemide 40 MG tablet Commonly known as:  LASIX   naproxen 500 MG tablet Commonly known as:  NAPROSYN   sacubitril-valsartan 97-103 MG Commonly known as:  ENTRESTO   spironolactone 25 MG tablet Commonly known as:  ALDACTONE     TAKE these medications   amiodarone 200 MG tablet Commonly known as:  PACERONE Take 1 tablet (200 mg total) by mouth daily.   colchicine 0.6 MG tablet Take 1 tablet (0.6 mg total) by mouth 2 (two) times daily as needed. For gout flares.   emtricitabine-rilpivir-tenofovir AF 200-25-25 MG Tabs tablet Commonly known as:  ODEFSEY Take 1 tablet by mouth daily with breakfast.   hydrOXYzine 50 MG tablet Commonly known as:  ATARAX/VISTARIL take 1 tablet by mouth three times a day if needed   LORazepam 1 MG tablet Commonly known as:  ATIVAN Take 1 tablet (1 mg total) by mouth at bedtime.   mirtazapine 7.5 MG tablet Commonly known as:  REMERON Take 1 tablet (7.5 mg total) by mouth at bedtime.   potassium chloride SA 20 MEQ tablet Commonly known as:  K-DUR,KLOR-CON Take 1 tablet by mouth twice daily when you take Lasix.   TIVICAY 50 MG tablet Generic drug:  dolutegravir take 1 tablet by mouth once daily WITH LUNCH   valACYclovir 1000 MG tablet Commonly known as:  VALTREX Take 1 tablet (1,000 mg total) by  mouth 2 (two) times daily as needed (outbreaks). Take for 5 days for an outbreak      Discharge Instructions: Please refer to Patient Instructions section of EMR for full details.  Patient was counseled important signs and symptoms that should prompt return to medical care, changes in medications, dietary instructions, activity restrictions, and follow up appointments.   Follow-Up Appointments: Pt to follow up with Dr. Shirlee Latch at [facility] this week.    Freddrick March, MD 12/22/2016, 9:38 AM PGY-2, [facility]

## 2016-12-23 LAB — HIV-1 RNA QUANT-NO REFLEX-BLD
HIV 1 RNA Quant: <20 copies/mL
LOG10 HIV-1 RNA: UNDETERMINED log10copy/mL

## 2016-12-23 LAB — CULTURE, BLOOD (ROUTINE X 2)
CULTURE: NO GROWTH
Culture: NO GROWTH
SPECIAL REQUESTS: ADEQUATE
Special Requests: ADEQUATE

## 2016-12-23 LAB — RPR: RPR Ser Ql: NONREACTIVE

## 2016-12-24 ENCOUNTER — Emergency Department (HOSPITAL_COMMUNITY)
Admission: EM | Admit: 2016-12-24 | Discharge: 2016-12-24 | Disposition: A | Discharge status: Home / Self Care | Payer: Medicare Other | Attending: Emergency Medicine | Admitting: Emergency Medicine

## 2016-12-24 ENCOUNTER — Other Ambulatory Visit: Payer: Self-pay | Admitting: *Deleted

## 2016-12-24 ENCOUNTER — Encounter (HOSPITAL_COMMUNITY): Payer: Self-pay

## 2016-12-24 DIAGNOSIS — I509 Heart failure, unspecified: Secondary | ICD-10-CM | POA: Insufficient documentation

## 2016-12-24 DIAGNOSIS — Z79899 Other long term (current) drug therapy: Secondary | ICD-10-CM | POA: Diagnosis not present

## 2016-12-24 DIAGNOSIS — B20 Human immunodeficiency virus [HIV] disease: Secondary | ICD-10-CM | POA: Insufficient documentation

## 2016-12-24 DIAGNOSIS — I110 Hypertensive heart disease with heart failure: Secondary | ICD-10-CM | POA: Insufficient documentation

## 2016-12-24 DIAGNOSIS — R21 Rash and other nonspecific skin eruption: Secondary | ICD-10-CM | POA: Diagnosis not present

## 2016-12-24 MED ORDER — LORATADINE 10 MG PO TABS
10.0000 mg | ORAL_TABLET | Freq: Once | ORAL | Status: AC
Start: 2016-12-24 — End: 2016-12-24
  Administered 2016-12-24: 10 mg via ORAL
  Filled 2016-12-24: qty 1

## 2016-12-24 MED ORDER — FAMOTIDINE 20 MG PO TABS
20.0000 mg | ORAL_TABLET | Freq: Once | ORAL | Status: AC
  Administered 2016-12-24: 20 mg via ORAL
  Filled 2016-12-24: qty 1

## 2016-12-24 MED ORDER — FAMOTIDINE 20 MG PO TABS: 20.0000 mg | ORAL_TABLET | Freq: Two times a day (BID) | ORAL | 0 refills | Status: DC

## 2016-12-24 NOTE — ED Provider Notes (Signed)
[facility]-EMERGENCY DEPT Provider Note   CSN: [id number] Arrival date & time: 12/24/16  0051     History   Chief Complaint Chief Complaint  Patient presents with  . Rash    HPI [name] is a 51 y.o. male.  The history is provided by the patient.  Rash   This is a chronic problem. The current episode started more than 1 week ago (more than 3 months). The problem has been gradually worsening. The problem is associated with nothing. There has been no fever. The rash is present on the torso, back, abdomen, left upper leg, left lower leg, right lower leg and right upper leg. The patient is experiencing no pain. Associated symptoms include itching. Pertinent negatives include no pain and no weeping. He has tried antihistamines and steriods for the symptoms. The treatment provided no relief. Risk factors: none.  Has seen the ED and dermatology and had lesions biopsied and no one knows what it is.  He is on atarax and triamcinolone without relief.    Past Medical History:  Diagnosis Date  . CHF (congestive heart failure) (HCC)   . Chronic systolic heart failure (HCC)    a. Echo 12/05/11:  EF 20-25%, diff HK with mild sparing of IL wall, mild AI, mod MR, mild LAE, mild RVE, mild reduced RVF.;  b.  Echo 05/11/2012:  Mild LVH, EF 20-25%, Gr 1 diast dysfn, mod MR, mild LAE  . Depression   . G6PD deficiency (HCC)   . GERD (gastroesophageal reflux disease)   . Hepatitis    Hep B and Hep C (patient does not report this but these are listed in previous notes.)  . HIV infection (HCC)   . Hypertension   . NICM (nonischemic cardiomyopathy) (HCC)    cardia CTA 8/13 negative for obstructive CAD    Patient Active Problem List   Diagnosis Date Noted  . Skin rash   . Elevated liver enzymes   . Hypotension 12/18/2016  . Scabies 09/17/2016  . Screening examination for venereal disease 03/21/2015  . Encounter for long-term (current) use of medications 03/21/2015  . Pain in joint, ankle and foot  03/21/2015  . Insomnia 06/26/2014  . Dehydration 02/22/2014  . AKI (acute kidney injury) (HCC) 02/22/2014  . Hypokalemia 02/04/2014  . Ventricular tachycardia, polymorphic (HCC) 02/03/2014  . Headache 02/03/2014  . PTSD (post-traumatic stress disorder) 02/03/2014  . Implantable cardioverter-defibrillator (ICD) discharge 01/27/2014  . Sustained VT (ventricular tachycardia) (HCC) 01/27/2014  . ICD (implantable cardioverter-defibrillator), biventricular, in situ 01/23/2014  . Paroxysmal ventricular fibrillation (HCC) 01/19/2014  . Gout attack 04/24/2013  . Protein-calorie malnutrition, severe (HCC) 02/13/2013  . GERD (gastroesophageal reflux disease) 08/19/2012  . Chronic systolic heart failure (HCC) 01/25/2012  . Wide-complex tachycardia (HCC) 01/07/2012  . Cardiomyopathy (HCC) 12/07/2011  . Alcohol abuse 11/30/2011  . Marijuana abuse 11/30/2011  . Human immunodeficiency virus (HIV) disease (HCC) 06/09/2006  . GENITAL HERPES 06/09/2006  . Chronic hepatitis C without hepatic coma (HCC) 06/09/2006  . Essential hypertension 06/09/2006    Past Surgical History:  Procedure Laterality Date  . BI-VENTRICULAR PACEMAKER INSERTION N/A 06/26/2013   Procedure: BI-VENTRICULAR PACEMAKER INSERTION (CRT-P);  Surgeon: Marinus Maw, MD;  Location: [facility] CATH LAB;  Service: Cardiovascular;  Laterality: N/A;  . COLONOSCOPY WITH PROPOFOL N/A 08/14/2016   Procedure: COLONOSCOPY WITH PROPOFOL;  Surgeon: Jeani Hawking, MD;  Location: [facility] ENDOSCOPY;  Service: Endoscopy;  Laterality: N/A;  . ELECTROPHYSIOLOGY STUDY N/A 02/19/2012   Procedure: ELECTROPHYSIOLOGY STUDY;  Surgeon: Sharlot Gowda  Myna Hidalgo, MD;  Location: [facility] CATH LAB;  Service: Cardiovascular;  Laterality: N/A;  . ESOPHAGOGASTRODUODENOSCOPY  12/07/2011   Procedure: ESOPHAGOGASTRODUODENOSCOPY (EGD);  Surgeon: [name], MD,FACG;  Location: [facility] ENDOSCOPY;  Service: Endoscopy;  Laterality: N/A;  . FINGER SURGERY     Thumb laceration.    Marland Kitchen LEFT HEART  CATHETERIZATION WITH CORONARY ANGIOGRAM N/A 01/31/2014   Procedure: LEFT HEART CATHETERIZATION WITH CORONARY ANGIOGRAM;  Surgeon: Iran Ouch, MD;  Location: [facility] CATH LAB;  Service: Cardiovascular;  Laterality: N/A;       Home Medications    Prior to Admission medications   Medication Sig Start Date End Date Taking? Authorizing Provider  amiodarone (PACERONE) 200 MG tablet Take 1 tablet (200 mg total) by mouth daily. 04/15/16   Marinus Maw, MD  colchicine 0.6 MG tablet Take 1 tablet (0.6 mg total) by mouth 2 (two) times daily as needed. For gout flares. 12/25/14   [name], MD  emtricitabine-rilpivir-tenofovir AF (ODEFSEY) 200-25-25 MG TABS tablet Take 1 tablet by mouth daily with breakfast. 09/11/16   Comer, [name], MD  hydrOXYzine (ATARAX/VISTARIL) 50 MG tablet take 1 tablet by mouth three times a day if needed 11/04/16   Elpidio Anis, PA-C  LORazepam (ATIVAN) 1 MG tablet Take 1 tablet (1 mg total) by mouth at bedtime. 11/04/16   Elpidio Anis, PA-C  mirtazapine (REMERON) 7.5 MG tablet Take 1 tablet (7.5 mg total) by mouth at bedtime. 07/02/16   Comer, [name], MD  potassium chloride SA (K-DUR,KLOR-CON) 20 MEQ tablet Take 1 tablet by mouth twice daily when you take Lasix. 10/20/16   Bensimhon, Bevelyn Buckles, MD  TIVICAY 50 MG tablet take 1 tablet by mouth once daily WITH LUNCH 09/11/16   Comer, [name], MD  valACYclovir (VALTREX) 1000 MG tablet Take 1 tablet (1,000 mg total) by mouth 2 (two) times daily as needed (outbreaks). Take for 5 days for an outbreak 02/22/14   Bensimhon, Bevelyn Buckles, MD    Family History Family History  Problem Relation Age of Onset  . Lung cancer Mother   . Colon cancer Unknown 69    Social History Social History  Substance Use Topics  . Smoking status: Never Smoker  . Smokeless tobacco: Never Used  . Alcohol use 0.6 oz/week    1 Cans of beer per week     Comment: occ beer     Allergies   Bactrim   Review of Systems Review of Systems    Constitutional: Negative for fever.  HENT: Negative for mouth sores.   Genitourinary: Negative for discharge.  Musculoskeletal: Negative for myalgias, neck pain and neck stiffness.  Skin: Positive for itching and rash.  All other systems reviewed and are negative.    Physical Exam Updated Vital Signs BP (!) 131/97 (BP Location: Right Arm)   Pulse 84   Temp 98.2 F (36.8 C) (Oral)   Resp 20   Ht 5\' 4"  (1.626 m)   Wt 73 kg (161 lb)   SpO2 98%   BMI 27.64 kg/m   Physical Exam  Constitutional: He is oriented to person, place, and time. He appears well-developed and well-nourished. No distress.  HENT:  Head: Normocephalic and atraumatic.  Mouth/Throat: No oropharyngeal exudate.  No oral lesions, no swelling of the lips tongue or uvula  Eyes: Pupils are equal, round, and reactive to light. Conjunctivae are normal.  Neck: Normal range of motion. Neck supple.  Cardiovascular: Normal rate, regular rhythm, normal heart sounds and intact  distal pulses.   Pulmonary/Chest: Effort normal and breath sounds normal. He has no wheezes. He has no rales.  Abdominal: Soft. Bowel sounds are normal.  Musculoskeletal: Normal range of motion.  Neurological: He is alert and oriented to person, place, and time. He displays normal reflexes.  Skin: Skin is warm and dry. Capillary refill takes less than 2 seconds.  5 mm macules sparing the face palms and soles  Psychiatric: He has a normal mood and affect.     ED Treatments / Results   Vitals:   12/24/16 0108  BP: (!) 131/97  Pulse: 84  Resp: 20  Temp: 98.2 F (36.8 C)    Radiology No results found.  Procedures Procedures (including critical care time)  Medications Ordered in ED Medications  famotidine (PEPCID) tablet 20 mg (not administered)  loratadine (CLARITIN) tablet 10 mg (not administered)      Final Clinical Impressions(s) / ED Diagnoses   Strict return precautions given for weakness, difficulty ambulating, inability to  tolerate oral medication,  fevers, vomiting, decreased urine output, or any concerns. No signs of systemic illness or infection. The patient is nontoxic-appearing on exam and vital signs are within normal limits.   I have reviewed the triage vital signs and the nursing notes. Pertinent labs &imaging results that were available during my care of the patient were reviewed by me and considered in my medical decision making (see chart for details).  After history, exam, and medical workup I feel the patient has been appropriately medically screened and is safe for discharge home. Pertinent diagnoses were discussed with the patient. Patient was given return precautions.    Danne Vasek, MD 12/24/16 2025

## 2016-12-24 NOTE — ED Triage Notes (Signed)
Pt here for continued rash, sts going onsince April, and no answers, ahs been seen numerous times by ED, PMD, dermatology with no answers given.

## 2016-12-28 DIAGNOSIS — L281 Prurigo nodularis: Secondary | ICD-10-CM | POA: Diagnosis not present

## 2016-12-29 ENCOUNTER — Other Ambulatory Visit (HOSPITAL_COMMUNITY): Payer: Self-pay | Admitting: *Deleted

## 2016-12-29 MED ORDER — SACUBITRIL-VALSARTAN 49-51 MG PO TABS: 1.0000 | ORAL_TABLET | Freq: Two times a day (BID) | ORAL | 3 refills | Status: DC

## 2016-12-29 MED ORDER — CARVEDILOL 25 MG PO TABS: 12.5000 mg | ORAL_TABLET | Freq: Two times a day (BID) | ORAL | 3 refills | Status: DC

## 2016-12-29 MED ORDER — SPIRONOLACTONE 25 MG PO TABS: 25.0000 mg | ORAL_TABLET | Freq: Every day | ORAL | 3 refills | Status: DC

## 2016-12-29 MED ORDER — FUROSEMIDE 40 MG PO TABS: 40.0000 mg | ORAL_TABLET | ORAL | 3 refills | Status: DC | PRN

## 2016-12-30 DIAGNOSIS — L281 Prurigo nodularis: Secondary | ICD-10-CM | POA: Diagnosis not present

## 2017-01-01 ENCOUNTER — Encounter (HOSPITAL_COMMUNITY): Payer: Self-pay

## 2017-01-01 ENCOUNTER — Ambulatory Visit (HOSPITAL_COMMUNITY)
Admission: RE | Admit: 2017-01-01 | Discharge: 2017-01-01 | Disposition: A | Discharge status: Home / Self Care | Payer: Medicare Other | Source: Ambulatory Visit | Attending: Internal Medicine | Admitting: Internal Medicine

## 2017-01-01 VITALS — BP 102/71 | HR 67

## 2017-01-01 DIAGNOSIS — L281 Prurigo nodularis: Secondary | ICD-10-CM | POA: Diagnosis not present

## 2017-01-01 DIAGNOSIS — I5022 Chronic systolic (congestive) heart failure: Secondary | ICD-10-CM | POA: Diagnosis not present

## 2017-01-01 LAB — BASIC METABOLIC PANEL
ANION GAP: 9 (ref 5–15)
BUN: 25 mg/dL — ABNORMAL HIGH (ref 6–20)
CALCIUM: 9.1 mg/dL (ref 8.9–10.3)
CO2: 24 mmol/L (ref 22–32)
CREATININE: 1.53 mg/dL — AB (ref 0.61–1.24)
Chloride: 106 mmol/L (ref 101–111)
GFR, EST AFRICAN AMERICAN: 58 mL/min — AB (ref >60)
GFR, EST NON AFRICAN AMERICAN: 50 mL/min — AB (ref >60)
Glucose, Bld: 108 mg/dL — ABNORMAL HIGH (ref 65–99)
Potassium: 4.5 mmol/L (ref 3.5–5.1)
SODIUM: 139 mmol/L (ref 135–145)

## 2017-01-04 ENCOUNTER — Telehealth (HOSPITAL_COMMUNITY): Payer: Self-pay | Admitting: *Deleted

## 2017-01-04 DIAGNOSIS — L281 Prurigo nodularis: Secondary | ICD-10-CM | POA: Diagnosis not present

## 2017-01-04 DIAGNOSIS — I5022 Chronic systolic (congestive) heart failure: Secondary | ICD-10-CM

## 2017-01-04 NOTE — Telephone Encounter (Signed)
Basic metabolic panel  Order: [id number]  Status:  Final result Visible to patient:  Yes (MyChart) Dx:  Chronic systolic heart failure (HCC)  Notes recorded by Georgina Peer, RN on 01/04/2017 at 4:22 PM EDT Called and spoke with patient, he is agreeable with plan. Appointment scheduled and lab order placed. ------  Notes recorded by Laurey Morale, MD on 01/01/2017 at 4:05 PM EDT Creatinine likely stabilized at baseline, would repeat BMET 10 days.

## 2017-01-06 DIAGNOSIS — L281 Prurigo nodularis: Secondary | ICD-10-CM | POA: Diagnosis not present

## 2017-01-08 DIAGNOSIS — L281 Prurigo nodularis: Secondary | ICD-10-CM | POA: Diagnosis not present

## 2017-01-11 ENCOUNTER — Ambulatory Visit (HOSPITAL_COMMUNITY)
Admission: RE | Admit: 2017-01-11 | Discharge: 2017-01-11 | Disposition: A | Discharge status: Home / Self Care | Payer: Medicare Other | Source: Ambulatory Visit | Attending: Internal Medicine | Admitting: Internal Medicine

## 2017-01-11 DIAGNOSIS — L281 Prurigo nodularis: Secondary | ICD-10-CM | POA: Diagnosis not present

## 2017-01-11 DIAGNOSIS — I5022 Chronic systolic (congestive) heart failure: Secondary | ICD-10-CM | POA: Insufficient documentation

## 2017-01-11 LAB — BASIC METABOLIC PANEL
Anion gap: 6 (ref 5–15)
BUN: 27 mg/dL — AB (ref 6–20)
CALCIUM: 9.1 mg/dL (ref 8.9–10.3)
CHLORIDE: 109 mmol/L (ref 101–111)
CO2: 25 mmol/L (ref 22–32)
CREATININE: 1.22 mg/dL (ref 0.61–1.24)
GFR calc non Af Amer: >60 mL/min (ref >60)
Glucose, Bld: 98 mg/dL (ref 65–99)
Potassium: 4.6 mmol/L (ref 3.5–5.1)
SODIUM: 140 mmol/L (ref 135–145)

## 2017-01-13 ENCOUNTER — Ambulatory Visit (INDEPENDENT_AMBULATORY_CARE_PROVIDER_SITE_OTHER): Payer: Medicare Other | Admitting: *Deleted

## 2017-01-13 DIAGNOSIS — I5022 Chronic systolic (congestive) heart failure: Secondary | ICD-10-CM

## 2017-01-13 DIAGNOSIS — I429 Cardiomyopathy, unspecified: Secondary | ICD-10-CM

## 2017-01-13 DIAGNOSIS — L281 Prurigo nodularis: Secondary | ICD-10-CM | POA: Diagnosis not present

## 2017-01-14 LAB — CUP PACEART REMOTE DEVICE CHECK
Battery Voltage: 2.95 V
Brady Statistic AP VP Percent: 0.31 %
Brady Statistic AS VP Percent: 98.39 %
Brady Statistic RA Percent Paced: 0.33 %
Brady Statistic RV Percent Paced: 11.43 %
HighPow Impedance: 91 Ohm
Implantable Lead Implant Date: 20150126
Implantable Lead Implant Date: 20150126
Implantable Lead Location: 753859
Implantable Lead Location: 753860
Implantable Lead Model: 4396
Implantable Lead Model: 5076
Lead Channel Impedance Value: 456 Ohm
Lead Channel Impedance Value: 513 Ohm
Lead Channel Impedance Value: 532 Ohm
Lead Channel Impedance Value: 893 Ohm
Lead Channel Pacing Threshold Amplitude: 0.625 V
Lead Channel Pacing Threshold Amplitude: 0.625 V
Lead Channel Pacing Threshold Amplitude: 0.875 V
Lead Channel Pacing Threshold Pulse Width: 0.4 ms
Lead Channel Pacing Threshold Pulse Width: 0.4 ms
Lead Channel Sensing Intrinsic Amplitude: 1.5 mV
Lead Channel Setting Pacing Amplitude: 2 V
Lead Channel Setting Pacing Amplitude: 2.5 V
Lead Channel Setting Pacing Pulse Width: 0.4 ms
Lead Channel Setting Sensing Sensitivity: 0.3 mV
MDC IDC LEAD IMPLANT DT: 20150126
MDC IDC LEAD LOCATION: 753858
MDC IDC MSMT BATTERY REMAINING LONGEVITY: 41 mo
MDC IDC MSMT LEADCHNL LV IMPEDANCE VALUE: 532 Ohm
MDC IDC MSMT LEADCHNL RA IMPEDANCE VALUE: 418 Ohm
MDC IDC MSMT LEADCHNL RA PACING THRESHOLD PULSEWIDTH: 0.4 ms
MDC IDC MSMT LEADCHNL RA SENSING INTR AMPL: 1.5 mV
MDC IDC MSMT LEADCHNL RV SENSING INTR AMPL: 12.125 mV
MDC IDC MSMT LEADCHNL RV SENSING INTR AMPL: 12.125 mV
MDC IDC PG IMPLANT DT: 20150126
MDC IDC SESS DTM: 20180815122605
MDC IDC SET LEADCHNL LV PACING AMPLITUDE: 2 V
MDC IDC SET LEADCHNL LV PACING PULSEWIDTH: 0.4 ms
MDC IDC STAT BRADY AP VS PERCENT: 0.02 %
MDC IDC STAT BRADY AS VS PERCENT: 1.28 %

## 2017-01-14 NOTE — Progress Notes (Signed)
Remote defibrillator check.  

## 2017-01-15 DIAGNOSIS — L281 Prurigo nodularis: Secondary | ICD-10-CM | POA: Diagnosis not present

## 2017-01-18 ENCOUNTER — Telehealth: Payer: Self-pay | Admitting: *Deleted

## 2017-01-18 DIAGNOSIS — L281 Prurigo nodularis: Secondary | ICD-10-CM | POA: Diagnosis not present

## 2017-01-18 NOTE — Telephone Encounter (Signed)
Ok to refill 

## 2017-01-18 NOTE — Telephone Encounter (Signed)
Patient called for a refill on Valtrex. We have not filled this medication since 2015 and advised will have to ask the provider if it can be refilled. Will ask and give him a call once he responds.

## 2017-01-19 MED ORDER — VALACYCLOVIR HCL 1 G PO TABS: 1000.0000 mg | ORAL_TABLET | Freq: Two times a day (BID) | ORAL | 6 refills | Status: DC | PRN

## 2017-01-19 NOTE — Addendum Note (Signed)
Addended by: Lurlean Leyden on: 01/19/2017 08:50 AM   Modules accepted: Orders

## 2017-01-25 ENCOUNTER — Emergency Department (HOSPITAL_COMMUNITY): Payer: Medicare Other

## 2017-01-25 ENCOUNTER — Inpatient Hospital Stay (HOSPITAL_COMMUNITY)
Admission: EM | Admit: 2017-01-25 | Discharge: 2017-01-29 | DRG: 493 | Disposition: A | Discharge status: Skilled Nursing Facility | Payer: Medicare Other | Attending: Internal Medicine | Admitting: Internal Medicine

## 2017-01-25 ENCOUNTER — Encounter (HOSPITAL_COMMUNITY): Payer: Self-pay | Admitting: Emergency Medicine

## 2017-01-25 DIAGNOSIS — B182 Chronic viral hepatitis C: Secondary | ICD-10-CM | POA: Diagnosis present

## 2017-01-25 DIAGNOSIS — N182 Chronic kidney disease, stage 2 (mild): Secondary | ICD-10-CM | POA: Diagnosis present

## 2017-01-25 DIAGNOSIS — S82851A Displaced trimalleolar fracture of right lower leg, initial encounter for closed fracture: Principal | ICD-10-CM | POA: Diagnosis present

## 2017-01-25 DIAGNOSIS — Y92009 Unspecified place in unspecified non-institutional (private) residence as the place of occurrence of the external cause: Secondary | ICD-10-CM

## 2017-01-25 DIAGNOSIS — I5022 Chronic systolic (congestive) heart failure: Secondary | ICD-10-CM | POA: Diagnosis not present

## 2017-01-25 DIAGNOSIS — S62202A Unspecified fracture of first metacarpal bone, left hand, initial encounter for closed fracture: Secondary | ICD-10-CM | POA: Diagnosis not present

## 2017-01-25 DIAGNOSIS — M25571 Pain in right ankle and joints of right foot: Secondary | ICD-10-CM | POA: Diagnosis not present

## 2017-01-25 DIAGNOSIS — I110 Hypertensive heart disease with heart failure: Secondary | ICD-10-CM | POA: Diagnosis not present

## 2017-01-25 DIAGNOSIS — F4310 Post-traumatic stress disorder, unspecified: Secondary | ICD-10-CM | POA: Diagnosis present

## 2017-01-25 DIAGNOSIS — T502X5A Adverse effect of carbonic-anhydrase inhibitors, benzothiadiazides and other diuretics, initial encounter: Secondary | ICD-10-CM | POA: Diagnosis present

## 2017-01-25 DIAGNOSIS — M109 Gout, unspecified: Secondary | ICD-10-CM | POA: Diagnosis present

## 2017-01-25 DIAGNOSIS — S82891D Other fracture of right lower leg, subsequent encounter for closed fracture with routine healing: Secondary | ICD-10-CM | POA: Diagnosis not present

## 2017-01-25 DIAGNOSIS — R748 Abnormal levels of other serum enzymes: Secondary | ICD-10-CM | POA: Diagnosis not present

## 2017-01-25 DIAGNOSIS — S82891A Other fracture of right lower leg, initial encounter for closed fracture: Secondary | ICD-10-CM | POA: Diagnosis not present

## 2017-01-25 DIAGNOSIS — I10 Essential (primary) hypertension: Secondary | ICD-10-CM | POA: Diagnosis not present

## 2017-01-25 DIAGNOSIS — F1010 Alcohol abuse, uncomplicated: Secondary | ICD-10-CM | POA: Diagnosis present

## 2017-01-25 DIAGNOSIS — S82142S Displaced bicondylar fracture of left tibia, sequela: Secondary | ICD-10-CM | POA: Diagnosis not present

## 2017-01-25 DIAGNOSIS — R41841 Cognitive communication deficit: Secondary | ICD-10-CM | POA: Diagnosis not present

## 2017-01-25 DIAGNOSIS — N179 Acute kidney failure, unspecified: Secondary | ICD-10-CM | POA: Diagnosis present

## 2017-01-25 DIAGNOSIS — K219 Gastro-esophageal reflux disease without esophagitis: Secondary | ICD-10-CM | POA: Diagnosis present

## 2017-01-25 DIAGNOSIS — R55 Syncope and collapse: Secondary | ICD-10-CM | POA: Diagnosis not present

## 2017-01-25 DIAGNOSIS — R21 Rash and other nonspecific skin eruption: Secondary | ICD-10-CM | POA: Diagnosis not present

## 2017-01-25 DIAGNOSIS — T07XXXA Unspecified multiple injuries, initial encounter: Secondary | ICD-10-CM | POA: Diagnosis present

## 2017-01-25 DIAGNOSIS — S90929A Unspecified superficial injury of unspecified foot, initial encounter: Secondary | ICD-10-CM | POA: Diagnosis not present

## 2017-01-25 DIAGNOSIS — Y939 Activity, unspecified: Secondary | ICD-10-CM

## 2017-01-25 DIAGNOSIS — S82102A Unspecified fracture of upper end of left tibia, initial encounter for closed fracture: Secondary | ICD-10-CM | POA: Diagnosis present

## 2017-01-25 DIAGNOSIS — G8911 Acute pain due to trauma: Secondary | ICD-10-CM | POA: Diagnosis not present

## 2017-01-25 DIAGNOSIS — I959 Hypotension, unspecified: Secondary | ICD-10-CM | POA: Diagnosis not present

## 2017-01-25 DIAGNOSIS — Z21 Asymptomatic human immunodeficiency virus [HIV] infection status: Secondary | ICD-10-CM | POA: Diagnosis present

## 2017-01-25 DIAGNOSIS — B20 Human immunodeficiency virus [HIV] disease: Secondary | ICD-10-CM | POA: Diagnosis not present

## 2017-01-25 DIAGNOSIS — I429 Cardiomyopathy, unspecified: Secondary | ICD-10-CM

## 2017-01-25 DIAGNOSIS — W19XXXA Unspecified fall, initial encounter: Secondary | ICD-10-CM | POA: Diagnosis present

## 2017-01-25 DIAGNOSIS — R079 Chest pain, unspecified: Secondary | ICD-10-CM | POA: Diagnosis not present

## 2017-01-25 DIAGNOSIS — I428 Other cardiomyopathies: Secondary | ICD-10-CM | POA: Diagnosis not present

## 2017-01-25 DIAGNOSIS — S92909A Unspecified fracture of unspecified foot, initial encounter for closed fracture: Secondary | ICD-10-CM | POA: Diagnosis not present

## 2017-01-25 DIAGNOSIS — Z4789 Encounter for other orthopedic aftercare: Secondary | ICD-10-CM | POA: Diagnosis not present

## 2017-01-25 DIAGNOSIS — E860 Dehydration: Secondary | ICD-10-CM | POA: Diagnosis not present

## 2017-01-25 DIAGNOSIS — S82145A Nondisplaced bicondylar fracture of left tibia, initial encounter for closed fracture: Secondary | ICD-10-CM | POA: Diagnosis not present

## 2017-01-25 DIAGNOSIS — R2689 Other abnormalities of gait and mobility: Secondary | ICD-10-CM | POA: Diagnosis not present

## 2017-01-25 DIAGNOSIS — S82142A Displaced bicondylar fracture of left tibia, initial encounter for closed fracture: Secondary | ICD-10-CM | POA: Diagnosis not present

## 2017-01-25 DIAGNOSIS — M79642 Pain in left hand: Secondary | ICD-10-CM | POA: Diagnosis not present

## 2017-01-25 DIAGNOSIS — I130 Hypertensive heart and chronic kidney disease with heart failure and stage 1 through stage 4 chronic kidney disease, or unspecified chronic kidney disease: Secondary | ICD-10-CM | POA: Diagnosis not present

## 2017-01-25 DIAGNOSIS — S62232A Other displaced fracture of base of first metacarpal bone, left hand, initial encounter for closed fracture: Secondary | ICD-10-CM | POA: Diagnosis not present

## 2017-01-25 DIAGNOSIS — Z95810 Presence of automatic (implantable) cardiac defibrillator: Secondary | ICD-10-CM | POA: Diagnosis not present

## 2017-01-25 DIAGNOSIS — I952 Hypotension due to drugs: Secondary | ICD-10-CM

## 2017-01-25 DIAGNOSIS — F329 Major depressive disorder, single episode, unspecified: Secondary | ICD-10-CM | POA: Diagnosis present

## 2017-01-25 DIAGNOSIS — Z881 Allergy status to other antibiotic agents status: Secondary | ICD-10-CM

## 2017-01-25 DIAGNOSIS — Z79899 Other long term (current) drug therapy: Secondary | ICD-10-CM

## 2017-01-25 DIAGNOSIS — G8918 Other acute postprocedural pain: Secondary | ICD-10-CM | POA: Diagnosis not present

## 2017-01-25 DIAGNOSIS — S62235A Other nondisplaced fracture of base of first metacarpal bone, left hand, initial encounter for closed fracture: Secondary | ICD-10-CM | POA: Diagnosis not present

## 2017-01-25 DIAGNOSIS — S82202A Unspecified fracture of shaft of left tibia, initial encounter for closed fracture: Secondary | ICD-10-CM | POA: Diagnosis not present

## 2017-01-25 DIAGNOSIS — N170 Acute kidney failure with tubular necrosis: Secondary | ICD-10-CM | POA: Diagnosis not present

## 2017-01-25 DIAGNOSIS — M6281 Muscle weakness (generalized): Secondary | ICD-10-CM | POA: Diagnosis not present

## 2017-01-25 DIAGNOSIS — Z01811 Encounter for preprocedural respiratory examination: Secondary | ICD-10-CM

## 2017-01-25 DIAGNOSIS — S62232S Other displaced fracture of base of first metacarpal bone, left hand, sequela: Secondary | ICD-10-CM | POA: Diagnosis not present

## 2017-01-25 DIAGNOSIS — I351 Nonrheumatic aortic (valve) insufficiency: Secondary | ICD-10-CM | POA: Diagnosis not present

## 2017-01-25 DIAGNOSIS — S82851S Displaced trimalleolar fracture of right lower leg, sequela: Secondary | ICD-10-CM | POA: Diagnosis not present

## 2017-01-25 LAB — COMPREHENSIVE METABOLIC PANEL
ALBUMIN: 3.4 g/dL — AB (ref 3.5–5.0)
ALT: 68 U/L — ABNORMAL HIGH (ref 17–63)
AST: 93 U/L — AB (ref 15–41)
Alkaline Phosphatase: 148 U/L — ABNORMAL HIGH (ref 38–126)
Anion gap: 12 (ref 5–15)
BUN: 39 mg/dL — AB (ref 6–20)
CHLORIDE: 108 mmol/L (ref 101–111)
CO2: 20 mmol/L — AB (ref 22–32)
CREATININE: 3.19 mg/dL — AB (ref 0.61–1.24)
Calcium: 9.3 mg/dL (ref 8.9–10.3)
GFR calc Af Amer: 24 mL/min — ABNORMAL LOW (ref >60)
GFR calc non Af Amer: 21 mL/min — ABNORMAL LOW (ref >60)
Glucose, Bld: 103 mg/dL — ABNORMAL HIGH (ref 65–99)
Potassium: 4.6 mmol/L (ref 3.5–5.1)
Sodium: 140 mmol/L (ref 135–145)
Total Bilirubin: 1.9 mg/dL — ABNORMAL HIGH (ref 0.3–1.2)
Total Protein: 8 g/dL (ref 6.5–8.1)

## 2017-01-25 LAB — CBC WITH DIFFERENTIAL/PLATELET
Basophils Absolute: 0.1 10*3/uL (ref 0.0–0.1)
Basophils Relative: 1 %
EOS PCT: 2 %
Eosinophils Absolute: 0.2 10*3/uL (ref 0.0–0.7)
HCT: 41.5 % (ref 39.0–52.0)
Hemoglobin: 13.7 g/dL (ref 13.0–17.0)
Lymphocytes Relative: 34 %
Lymphs Abs: 3.2 10*3/uL (ref 0.7–4.0)
MCH: 31.1 pg (ref 26.0–34.0)
MCHC: 33 g/dL (ref 30.0–36.0)
MCV: 94.1 fL (ref 78.0–100.0)
MONOS PCT: 6 %
Monocytes Absolute: 0.6 10*3/uL (ref 0.1–1.0)
Neutro Abs: 5.5 10*3/uL (ref 1.7–7.7)
Neutrophils Relative %: 57 %
PLATELETS: 153 10*3/uL (ref 150–400)
RBC: 4.41 MIL/uL (ref 4.22–5.81)
RDW: 15.2 % (ref 11.5–15.5)
WBC: 9.5 10*3/uL (ref 4.0–10.5)

## 2017-01-25 LAB — I-STAT TROPONIN, ED: TROPONIN I, POC: 0 ng/mL (ref 0.00–0.08)

## 2017-01-25 MED ORDER — FENTANYL CITRATE (PF) 100 MCG/2ML IJ SOLN
100.0000 ug | Freq: Once | INTRAMUSCULAR | Status: DC
  Filled 2017-01-25: qty 2

## 2017-01-25 MED ORDER — SODIUM CHLORIDE 0.9% FLUSH
3.0000 mL | Freq: Two times a day (BID) | INTRAVENOUS | Status: DC
  Administered 2017-01-25 – 2017-01-29 (×3): 3 mL via INTRAVENOUS

## 2017-01-25 MED ORDER — THIAMINE HCL 100 MG/ML IJ SOLN: 100.0000 mg | Freq: Every day | INTRAMUSCULAR | Status: DC

## 2017-01-25 MED ORDER — DEXTROSE 5 % IV SOLN: 500.0000 mg | Freq: Four times a day (QID) | INTRAVENOUS | Status: DC | PRN

## 2017-01-25 MED ORDER — SENNA 8.6 MG PO TABS
1.0000 | ORAL_TABLET | Freq: Two times a day (BID) | ORAL | Status: DC
  Administered 2017-01-26 – 2017-01-29 (×6): 8.6 mg via ORAL
  Filled 2017-01-25 (×7): qty 1

## 2017-01-25 MED ORDER — VITAMIN B-1 100 MG PO TABS
100.0000 mg | ORAL_TABLET | Freq: Every day | ORAL | Status: DC
  Administered 2017-01-26 – 2017-01-29 (×4): 100 mg via ORAL
  Filled 2017-01-25 (×4): qty 1

## 2017-01-25 MED ORDER — CLOBETASOL PROPIONATE 0.05 % EX OINT
1.0000 "application " | TOPICAL_OINTMENT | Freq: Two times a day (BID) | CUTANEOUS | Status: DC
  Administered 2017-01-25 – 2017-01-29 (×9): 1 via TOPICAL
  Filled 2017-01-25: qty 15

## 2017-01-25 MED ORDER — HYDROCODONE-ACETAMINOPHEN 5-325 MG PO TABS
1.0000 | ORAL_TABLET | Freq: Four times a day (QID) | ORAL | Status: DC | PRN
  Administered 2017-01-25: 2 via ORAL
  Filled 2017-01-25 (×2): qty 2

## 2017-01-25 MED ORDER — TRIAMCINOLONE ACETONIDE 0.1 % EX CREA
1.0000 "application " | TOPICAL_CREAM | Freq: Three times a day (TID) | CUTANEOUS | Status: DC
  Administered 2017-01-25 – 2017-01-29 (×9): 1 via TOPICAL
  Filled 2017-01-25: qty 15

## 2017-01-25 MED ORDER — METHOCARBAMOL 500 MG PO TABS
500.0000 mg | ORAL_TABLET | Freq: Four times a day (QID) | ORAL | Status: DC | PRN
  Administered 2017-01-25 – 2017-01-28 (×6): 500 mg via ORAL
  Filled 2017-01-25 (×8): qty 1

## 2017-01-25 MED ORDER — SODIUM CHLORIDE 0.9 % IV BOLUS (SEPSIS): 500.0000 mL | Freq: Once | INTRAVENOUS | Status: DC

## 2017-01-25 MED ORDER — DOLUTEGRAVIR SODIUM 50 MG PO TABS
50.0000 mg | ORAL_TABLET | Freq: Every day | ORAL | Status: DC
  Administered 2017-01-26 – 2017-01-29 (×4): 50 mg via ORAL
  Filled 2017-01-25 (×4): qty 1

## 2017-01-25 MED ORDER — LORAZEPAM 1 MG PO TABS
1.0000 mg | ORAL_TABLET | Freq: Every day | ORAL | Status: DC
  Administered 2017-01-25 – 2017-01-28 (×4): 1 mg via ORAL
  Filled 2017-01-25 (×4): qty 1

## 2017-01-25 MED ORDER — AMIODARONE HCL 200 MG PO TABS
200.0000 mg | ORAL_TABLET | Freq: Every day | ORAL | Status: DC
  Administered 2017-01-26 – 2017-01-29 (×4): 200 mg via ORAL
  Filled 2017-01-25 (×4): qty 1

## 2017-01-25 MED ORDER — MIRTAZAPINE 7.5 MG PO TABS
7.5000 mg | ORAL_TABLET | Freq: Every evening | ORAL | Status: DC | PRN
  Administered 2017-01-25: 7.5 mg via ORAL
  Filled 2017-01-25 (×2): qty 1

## 2017-01-25 MED ORDER — SODIUM CHLORIDE 0.9 % IV SOLN
INTRAVENOUS | Status: DC
  Administered 2017-01-25 – 2017-01-26 (×3): via INTRAVENOUS

## 2017-01-25 MED ORDER — MORPHINE SULFATE (PF) 4 MG/ML IV SOLN: 0.5000 mg | INTRAVENOUS | Status: DC | PRN

## 2017-01-25 MED ORDER — EMTRICITAB-RILPIVIR-TENOFOV AF 200-25-25 MG PO TABS
1.0000 | ORAL_TABLET | Freq: Every day | ORAL | Status: DC
  Administered 2017-01-27 – 2017-01-29 (×3): 1 via ORAL
  Filled 2017-01-25 (×4): qty 1

## 2017-01-25 MED ORDER — HYDROXYZINE HCL 25 MG PO TABS: 50.0000 mg | ORAL_TABLET | Freq: Three times a day (TID) | ORAL | Status: DC | PRN

## 2017-01-25 MED ORDER — SODIUM CHLORIDE 0.9 % IV BOLUS (SEPSIS)
500.0000 mL | Freq: Once | INTRAVENOUS | Status: AC
  Administered 2017-01-25: 500 mL via INTRAVENOUS

## 2017-01-25 MED ORDER — LORAZEPAM 2 MG/ML IJ SOLN: 1.0000 mg | Freq: Four times a day (QID) | INTRAMUSCULAR | Status: AC | PRN

## 2017-01-25 MED ORDER — FOLIC ACID 1 MG PO TABS
1.0000 mg | ORAL_TABLET | Freq: Every day | ORAL | Status: DC
  Administered 2017-01-26 – 2017-01-29 (×4): 1 mg via ORAL
  Filled 2017-01-25 (×4): qty 1

## 2017-01-25 MED ORDER — ADULT MULTIVITAMIN W/MINERALS CH
1.0000 | ORAL_TABLET | Freq: Every day | ORAL | Status: DC
  Administered 2017-01-26 – 2017-01-27 (×2): 1 via ORAL
  Filled 2017-01-25 (×2): qty 1

## 2017-01-25 MED ORDER — LORAZEPAM 1 MG PO TABS: 1.0000 mg | ORAL_TABLET | Freq: Four times a day (QID) | ORAL | Status: AC | PRN

## 2017-01-25 NOTE — ED Triage Notes (Signed)
Pt brought by EMS due to right ankle, left knee, left hand/thumb pain after fall last week. Pt states he has been unable to walk since accident. Pt was hypotensive en route to hospital. Pt states he has not taken his blood pressure medication today.

## 2017-01-25 NOTE — ED Notes (Signed)
Pacemaker interrogation successful and fax provided to Dr. Ethelda Chick.

## 2017-01-25 NOTE — ED Notes (Signed)
ED Provider at bedside. 

## 2017-01-25 NOTE — ED Notes (Signed)
ED Provider at bedside. PT MADE AWARE OF TRANSFER TO [facility]

## 2017-01-25 NOTE — H&P (Signed)
[name]:[id number] DOB: 1965-07-08 DOA: 01/25/2017     PCP: [name], MD   Outpatient Specialists: [facility]   Patient coming from:   home Lives  With family (husband)    Chief Complaint: Inability to bear weight secondary to right ankle left knee and left hand pain after a fall  HPI: [name] is a 51 y.o. male with medical history significant of HIV 1998, HCV, ETOH abuse and CHF due to NICM s/p [employer]  CRT-D (06/2013). GERD, HTN    Presented with inability to ambulate after a syncope episode resulting in the fall. Patient had a syncopal event 1 week ago and since then haven't been able to walk his husband had to "drag him around". He could not go to ER because he had steps in his house and he could not get out of the house.  To try to get to the bathroom which she is doing with great difficulty. He haven't had any chest pains or shortness of breath no nausea no vomiting currently. Denies hitting his head. In emergency department pacemaker was interrogated and showed no evidence of V. tach Reports his medication have been restarted a week ago and have been taking lasix 40 mg BID.     In July patient was evaluated by cardiology at that point he was endorsing lightheadedness with standing nap he was noted to be orthostatic in the office. He have had some episodes of nausea vomiting and watery diarrhea as well as poor by mouth intake his Lasix was discontinued as well as his Coreg spironolactone and Entresto. At that time he was admitted via teaching family medicine service was discharged on 21st of July he was rehydrated his blood pressure medications was held.   Of note patient has history of ventricular tachycardia followed by EP cardiology status post ACD no V. tach on latest ICD interrogation Has chronic rash has been treated repeatedly for scabies have biopsy done by dermatologist and start sure what's going on he's been followed at [facility] for  this.  Regarding pertinent Chronic problems: Patient had prior episodes of syncope followed by EP study which was noninducible for V. tach Patient originally was found to have cardiomyopathy and acute systolic heart fitting 2014 at that time his EF is between 20-25% last echogram in 2017 showed improved EF of 55-60 percent with grade 1 diastolic dysfunction  Patient has known history of HIV and hepatitis C followed by Dr. Luciana Axe with ID. Last HIV viral load was undetectable in 07/2016 CD 4 count was 540  IN ER:  Temp (24hrs), Avg:98.2 F (36.8 C), Min:98.2 F (36.8 C), Max:98.2 F (36.8 C)      on arrival  ED Triage Vitals  Enc Vitals Group     BP 01/25/17 1410 92/69     Pulse Rate 01/25/17 1410 80     Resp 01/25/17 1410 18     Temp 01/25/17 1410 98.2 F (36.8 C)     Temp Source 01/25/17 1410 Oral     SpO2 01/25/17 1358 95 %     Weight 01/25/17 1902 161 lb (73 kg)     Height 01/25/17 1902 5' 4.5" (1.638 m)     Head Circumference --      Peak Flow --      Pain Score 01/25/17 1414 9     Pain Loc --      Pain Edu? --      Excl. in GC? --  Latest RR 19  97%   HR  78  BP  92/50 Trop 0.00 Na 140  K 4.6  Bicarb 20  BUN 39  Cr 3.19 up from 1.22  Alb 3.4 WBC 9.5 hemoglobin 13.7 platelets 153  Fracture of first metacarpal Trimalleolar fracture of her right ankle Fracture of proximal Tibia on the left   Following Medications were ordered in ER: Medications  fentaNYL (SUBLIMAZE) injection 100 mcg (not administered)  sodium chloride 0.9 % bolus 500 mL (500 mLs Intravenous New Bag/Given 01/25/17 1903)     ER provider discussed case with:Dr.  Eulah Pont with orthopedics Who recommends: Transfer to [facility] for operative intervention on the ankle We'll see patient in consult in the morning    Hospitalist was called for admission for Syncope evaluation and pain control  Review of Systems:    Pertinent positives include:  fatigue,   Constitutional:  No weight loss, night  sweats, Fevers, chills,weight loss  HEENT:  No headaches, Difficulty swallowing,Tooth/dental problems,Sore throat,  No sneezing, itching, ear ache, nasal congestion, post nasal drip,  Cardio-vascular:  No chest pain, Orthopnea, PND, anasarca, dizziness, palpitations.no Bilateral lower extremity swelling  GI:  No heartburn, indigestion, abdominal pain, nausea, vomiting, diarrhea, change in bowel habits, loss of appetite, melena, blood in stool, hematemesis Resp:  no shortness of breath at rest. No dyspnea on exertion, No excess mucus, no productive cough, No non-productive cough, No coughing up of blood.No change in color of mucus.No wheezing. Skin:  no rash or lesions. No jaundice GU:  no dysuria, change in color of urine, no urgency or frequency. No straining to urinate.  No flank pain.  Musculoskeletal:  No joint pain or no joint swelling. No decreased range of motion. No back pain.  Psych:  No change in mood or affect. No depression or anxiety. No memory loss.  Neuro: no localizing neurological complaints, no tingling, no weakness, no double vision, no gait abnormality, no slurred speech, no confusion  As per HPI otherwise 10 point review of systems negative.   Past Medical History: Past Medical History:  Diagnosis Date  . CHF (congestive heart failure) (HCC)   . Chronic systolic heart failure (HCC)    a. Echo 12/05/11:  EF 20-25%, diff HK with mild sparing of IL wall, mild AI, mod MR, mild LAE, mild RVE, mild reduced RVF.;  b.  Echo 05/11/2012:  Mild LVH, EF 20-25%, Gr 1 diast dysfn, mod MR, mild LAE  . Depression   . G6PD deficiency (HCC)   . GERD (gastroesophageal reflux disease)   . Hepatitis    Hep B and Hep C (patient does not report this but these are listed in previous notes.)  . HIV infection (HCC)   . Hypertension   . NICM (nonischemic cardiomyopathy) (HCC)    cardia CTA 8/13 negative for obstructive CAD   Past Surgical History:  Procedure Laterality Date  .  BI-VENTRICULAR PACEMAKER INSERTION N/A 06/26/2013   Procedure: BI-VENTRICULAR PACEMAKER INSERTION (CRT-P);  Surgeon: Marinus Maw, MD;  Location[facility] CATH LAB;  Service: Cardiovascular;  Laterality: N/A;  . COLONOSCOPY WITH PROPOFOL N/A 08/14/2016   Procedure: COLONOSCOPY WITH PROPOFOL;  Surgeon: Jeani Hawking, MD;  Location: [facility] ENDOSCOPY;  Service: Endoscopy;  Laterality: N/A;  . ELECTROPHYSIOLOGY STUDY N/A 02/19/2012   Procedure: ELECTROPHYSIOLOGY STUDY;  Surgeon: Marinus Maw, MD;  Location: [facility] CATH LAB;  Service: Cardiovascular;  Laterality: N/A;  . ESOPHAGOGASTRODUODENOSCOPY  12/07/2011   Procedure: ESOPHAGOGASTRODUODENOSCOPY (EGD);  Surgeon: [name], MD,FACG;  Location: [facility] ENDOSCOPY;  Service: Endoscopy;  Laterality: N/A;  . FINGER SURGERY     Thumb laceration.    Marland Kitchen LEFT HEART CATHETERIZATION WITH CORONARY ANGIOGRAM N/A 01/31/2014   Procedure: LEFT HEART CATHETERIZATION WITH CORONARY ANGIOGRAM;  Surgeon: Iran Ouch, MD;  Location: [facility] CATH LAB;  Service: Cardiovascular;  Laterality: N/A;     Social History:  unable to bear weight. At baseline independent   reports that he has never smoked. He has never used smokeless tobacco. He reports that he drinks about 0.6 oz of alcohol per week . He reports that he uses drugs, including Marijuana, about 7 times per week.  Allergies:   Allergies  Allergen Reactions  . Bactrim Rash       Family History:   Family History  Problem Relation Age of Onset  . Lung cancer Mother   . Colon cancer Unknown 50    Medications: Prior to Admission medications   Medication Sig Start Date End Date Taking? Authorizing Provider  amiodarone (PACERONE) 200 MG tablet Take 1 tablet (200 mg total) by mouth daily. 04/15/16  Yes Marinus Maw, MD  carvedilol (COREG) 25 MG tablet Take 0.5 tablets (12.5 mg total) by mouth 2 (two) times daily with a meal. 12/29/16 02/12/17 Yes Laurey Morale, MD  clobetasol ointment (TEMOVATE) 0.05 % Apply 1  application topically 2 (two) times daily. 12/28/16  Yes [provider]  colchicine 0.6 MG tablet Take 1 tablet (0.6 mg total) by mouth 2 (two) times daily as needed. For gout flares. 12/25/14  Yes ComerBelia Heman, MD  emtricitabine-rilpivir-tenofovir AF (ODEFSEY) 200-25-25 MG TABS tablet Take 1 tablet by mouth daily with breakfast. 09/11/16  Yes Comer, [name], MD  ENTRESTO 97-103 MG Take 1 tablet by mouth 2 (two) times daily. 12/07/16  Yes [provider]  famotidine (PEPCID) 20 MG tablet Take 1 tablet (20 mg total) by mouth 2 (two) times daily. 12/24/16  Yes Palumbo, April, MD  furosemide (LASIX) 40 MG tablet Take 1 tablet (40 mg total) by mouth as needed for fluid or edema. 12/29/16 03/29/17 Yes Laurey Morale, MD  hydrOXYzine (ATARAX/VISTARIL) 50 MG tablet take 1 tablet by mouth three times a day if needed Patient taking differently: Take 50 mg by mouth 3 (three) times daily as needed for itching. take 1 tablet by mouth three times a day if needed 11/04/16  Yes Upstill, Shari, PA-C  ibuprofen (ADVIL,MOTRIN) 200 MG tablet Take 800 mg by mouth every 6 (six) hours as needed (pain).   Yes [provider]  LORazepam (ATIVAN) 1 MG tablet Take 1 tablet (1 mg total) by mouth at bedtime. 11/04/16  Yes Upstill, Melvenia Beam, PA-C  mirtazapine (REMERON) 7.5 MG tablet Take 1 tablet (7.5 mg total) by mouth at bedtime. 07/02/16  Yes Comer, [name], MD  potassium chloride SA (K-DUR,KLOR-CON) 20 MEQ tablet Take 1 tablet by mouth twice daily when you take Lasix. Patient taking differently: Take 10 mEq by mouth 2 (two) times daily. when you take Lasix. 10/20/16  Yes Bensimhon, Bevelyn Buckles, MD  spironolactone (ALDACTONE) 25 MG tablet Take 1 tablet (25 mg total) by mouth daily. Patient taking differently: Take 12.5 mg by mouth daily.  12/29/16 03/29/17 Yes Laurey Morale, MD  TIVICAY 50 MG tablet take 1 tablet by mouth once daily WITH LUNCH 09/11/16  Yes Comer, [name], MD  triamcinolone cream (KENALOG)  0.1 % Apply 1 application topically 3 (three) times daily. 01/14/17  Yes [provider]  valACYclovir (VALTREX) 1000 MG tablet Take 1 tablet (1,000 mg total) by mouth 2 (two) times daily as needed (outbreaks). Take for 5 days for an outbreak 01/19/17  Yes Comer, [name], MD  sacubitril-valsartan (ENTRESTO) 49-51 MG Take 1 tablet by mouth 2 (two) times daily. Patient not taking: Reported on 01/25/2017 12/29/16   Laurey Morale, MD    Physical Exam: Patient Vitals for the past 24 hrs:  BP Temp Temp src Pulse Resp SpO2 Height Weight  01/25/17 1902 (!) 92/50 - - - - - 5' 4.5" (1.638 m) 73 kg (161 lb)  01/25/17 1856 (!) 80/62 - - 78 19 97 % - -  01/25/17 1838 90/73 - - 75 19 98 % - -  01/25/17 1830 90/73 - - 77 12 96 % - -  01/25/17 1800 95/70 - - 68 19 93 % - -  01/25/17 1756 99/66 - - 77 18 96 % - -  01/25/17 1627 - - - (!) 112 - - - -  01/25/17 1410 92/69 98.2 F (36.8 C) Oral 80 18 96 % - -  01/25/17 1358 - - - - - 95 % - -    1. General:  in No Acute distress  well -appearing 2. Psychological: Alert and   Oriented 3. Head/ENT:     Dry Mucous Membranes                          Head Non traumatic, neck supple                            Poor Dentition 4. SKIN:  decreased Skin turgor,  Skin clean Dry and intact diffuse  hyperpigmented rash  5. Heart: Regular rate and rhythm no  Murmur, no Rub or gallop 6. Lungs: no wheezes or crackles   7. Abdomen: Soft, non-tender, Non distended   8. Lower extremities: no clubbing, cyanosis, or edema 9. Neurologically Grossly intact, moving all 4 extremities equally  10. MSK: Normal range of motion   body mass index is 27.21 kg/m.  Labs on Admission:   Labs on Admission: I have personally reviewed following labs and imaging studies  CBC:  Recent Labs Lab 01/25/17 1655  WBC 9.5  NEUTROABS 5.5  HGB 13.7  HCT 41.5  MCV 94.1  PLT 153   Basic Metabolic Panel:  Recent Labs Lab 01/25/17 1655  NA 140  K 4.6  CL 108  CO2  20*  GLUCOSE 103*  BUN 39*  CREATININE 3.19*  CALCIUM 9.3   GFR: Estimated Creatinine Clearance: 24.8 mL/min (A) (by C-G formula based on SCr of 3.19 mg/dL (H)). Liver Function Tests:  Recent Labs Lab 01/25/17 1655  AST 93*  ALT 68*  ALKPHOS 148*  BILITOT 1.9*  PROT 8.0  ALBUMIN 3.4*   No results for input(s): LIPASE, AMYLASE in the last 168 hours. No results for input(s): AMMONIA in the last 168 hours. Coagulation Profile: No results for input(s): INR, PROTIME in the last 168 hours. Cardiac Enzymes: No results for input(s): CKTOTAL, CKMB, CKMBINDEX, TROPONINI in the last 168 hours. BNP (last 3 results) No results for input(s): PROBNP in the last 8760 hours. HbA1C: No results for input(s): HGBA1C in the last 72 hours. CBG: No results for input(s): GLUCAP in the last 168 hours. Lipid Profile: No results for input(s): CHOL, HDL, LDLCALC, TRIG, CHOLHDL, LDLDIRECT in the last 72 hours. Thyroid Function Tests:  No results for input(s): TSH, T4TOTAL, FREET4, T3FREE, THYROIDAB in the last 72 hours. Anemia Panel: No results for input(s): VITAMINB12, FOLATE, FERRITIN, TIBC, IRON, RETICCTPCT in the last 72 hours. Urine analysis:    Component Value Date/Time   COLORURINE AMBER (A) 02/12/2013 1406   APPEARANCEUR CLEAR 02/12/2013 1406   LABSPEC 1.026 02/12/2013 1406   PHURINE 5.0 02/12/2013 1406   GLUCOSEU NEGATIVE 02/12/2013 1406   GLUCOSEU NEG mg/dL 84/13/2440 1027   HGBUR NEGATIVE 02/12/2013 1406   HGBUR negative 09/21/2007 1425   BILIRUBINUR SMALL (A) 02/12/2013 1406   KETONESUR NEGATIVE 02/12/2013 1406   PROTEINUR NEGATIVE 02/12/2013 1406   UROBILINOGEN 0.2 02/12/2013 1406   NITRITE NEGATIVE 02/12/2013 1406   LEUKOCYTESUR NEGATIVE 02/12/2013 1406   Sepsis Labs: @LABRCNTIP (procalcitonin:4,lacticidven:4) )No results found for this or any previous visit (from the past 240 hour(s)).     UA  no evidence of UTI   Lab Results  Component Value Date   HGBA1C 5.8 (H)  07/27/2012    Estimated Creatinine Clearance: 24.8 mL/min (A) (by C-G formula based on SCr of 3.19 mg/dL (H)).  BNP (last 3 results) No results for input(s): PROBNP in the last 8760 hours.   ECG REPORT  Independently reviewed Rate: 74  Rhythm: atrial paced ST&T Change: No acute ischemic changes   QTC 466  Filed Weights   01/25/17 1902  Weight: 73 kg (161 lb)     Cultures:    Component Value Date/Time   SDES BLOOD RIGHT HAND 12/18/2016 1220   SPECREQUEST IN PEDIATRIC BOTTLE Blood Culture adequate volume 12/18/2016 1220   CULT NO GROWTH 5 DAYS 12/18/2016 1220   REPTSTATUS 12/23/2016 FINAL 12/18/2016 1220     Radiological Exams on Admission: Dg Chest 2 View  Result Date: 01/25/2017 CLINICAL DATA:  Fall.  Chest pain. EXAM: CHEST  2 VIEW COMPARISON:  01/27/2014.  02/12/2013.  CT 01/20/2013.  CT 01/20/2012 FINDINGS: AICD in stable position. Heart size normal. Stable left base pleuroparenchymal thickening consistent with scarring. Follow-up chest x-ray recommended to exclude an underlying focal lesion/mass. No pleural effusion or pneumothorax. No evidence of displaced rib fracture. IMPRESSION: 1. Stable left base pleuroparenchymal thickening consistent with scarring. No acute abnormality identified . 2. AICD in stable position . Electronically Signed   By: Maisie Fus  Register   On: 01/25/2017 16:33   Dg Ankle Complete Right  Result Date: 01/25/2017 CLINICAL DATA:  Continued acute right ankle pain following fall 1 week ago. Initial encounter. EXAM: RIGHT ANKLE - COMPLETE 3+ VIEW COMPARISON:  None. FINDINGS: An oblique fracture of the distal fibula (4 mm lateral/ posterior displacement), a comminuted fracture the medial malleolus, and a nondisplaced fracture of the posterior tibial malleolus noted. A 1.5 cm bony fragment anterior to the joint on the lateral view is noted, likely displaced from the distal tibia. There is no evidence of subluxation or dislocation. IMPRESSION: Trimalleolar  fractures as described. 1.5 cm bony fragment anterior to the ankle joint on the lateral view likely displaced from the distal tibia. Electronically Signed   By: Harmon Pier M.D.   On: 01/25/2017 16:31   Dg Knee Complete 4 Views Left  Result Date: 01/25/2017 CLINICAL DATA:  Patient with diffuse knee pain status post fall. EXAM: LEFT KNEE - COMPLETE 4+ VIEW COMPARISON:  Left knee radiograph 11/23/2012. FINDINGS: Distal femur is unremarkable. There is a mildly distracted oblique fracture through the mid aspect of the proximal tibia extending to the articular surface near the tibial spines. Proximal fibula is unremarkable. Regional soft  tissues are unremarkable. IMPRESSION: Fracture of the proximal tibia extending to the articular surface. Electronically Signed   By: Annia Belt M.D.   On: 01/25/2017 16:34   Dg Hand Complete Left  Result Date: 01/25/2017 CLINICAL DATA:  Left hand pain secondary to a fall one week ago. EXAM: LEFT HAND - COMPLETE 3+ VIEW COMPARISON:  None. FINDINGS: There is a slightly displaced the hand angulated fracture of the base of the first metacarpal. No dislocation.  The other bones appear normal. IMPRESSION: Fracture of the base of the proximal first metacarpal with slight angulation and displacement. Electronically Signed   By: Francene Boyers M.D.   On: 01/25/2017 16:40   Dg Finger Thumb Left  Result Date: 01/25/2017 CLINICAL DATA:  Acute left thumb pain following fall 1 week ago. Initial encounter. EXAM: LEFT THUMB 2+V COMPARISON:  None. FINDINGS: A fracture of the proximal first metacarpal is identified and does not appear to extend to the carpometacarpal joint. Mild apex dorsal angulation is noted. No subluxation or dislocation. IMPRESSION: Proximal first metacarpal fracture. Electronically Signed   By: Harmon Pier M.D.   On: 01/25/2017 16:33    Chart has been reviewed    Assessment/Plan  51 y.o. male with medical history significant of HIV 1998, HCV, ETOH abuse and CHF  due to NICM s/p [employer]  CRT-D (06/2013). GERD, HTN Admitted for Syncope resulting in fall with multiple fractures  Present on Admission: . Closed fracture of base of first metacarpal of left hand -secondary to fall defer to orthopedics for management, pain control . Closed right ankle fracture -as per orthopedics will transfer to [facility] for possible palliative intervention . Closed fracture of left tibial plateau - defer to orthopedics continue to manage pain . Syncope -  given risk factor will admit rehydrate obtain CE, monitor on tele and obtain carotid dopplers, repeat echo Most likely secondary to dehydration from over diuresis will hold home medication patient has been orthostatic in the past noticed they concentrated dark urine.  Marland Kitchen HIV (human immunodeficiency virus infection) (HCC) - continue home medications check HIV viral load and CD4 count . Chronic hepatitis C without hepatic coma (HCC) -currently stable continue to monitor . Alcohol abuse - reports only drinking 3 beers a week will order CIWA protocol to monitor for any sign of withdrawal . Chronic systolic heart failure (HCC)- - currently appears to be   on the dry side, hold home diuretics, carefuly follow fluid status and Cr Defer to cardiology regarding restarting Lasix spironolactone and Entresto as  patient cardiac function has improved . Dehydration - rehydrate and follow fluid status . Elevated liver enzymes - history of hepatitis C . Essential hypertension - currently hypotensive we'll hold home medications.  . Hypotension - we'll hold home medication patient appears to be dehydrated likely over diuresis . PTSD (post-traumatic stress disorder) - currently stable . ARF (acute renal failure) (HCC)- - likely secondary to dehydration, check FeNA and if not improved with IVF would obtain renal US and consider renal consult.   . Skin rash- chronic continue creams patient had been steroids in the past but currently not  taking any. Will check cortisole AM level     Other plan as per orders.  DVT prophylaxis:  SCD    Code Status:  FULL CODE  as per patient   Family Communication:   Family not  at  Bedside    Disposition Plan: likely will need placement for rehabilitation  Would benefit from PT/OT eval prior to DC defer to orthopedics on timing of ordering                                                Consults called: Orthopedics Dr. Eulah Pont, emailed cardiology  Admission status:   inpatient    transfer  to [facility] as per orthopedics request  Level of care     tele           I have spent a total of 56 min on this admission Johnda Billiot 01/25/2017, 8:54 PM    [facility]  Pager [phone]   after 2 AM please page floor coverage PA If 7AM-7PM, please contact the day team taking care of the patient  [email or web address]  Password [email or web address]

## 2017-01-25 NOTE — ED Provider Notes (Addendum)
[facility]-EMERGENCY DEPT Provider Note   CSN: [id number] Arrival date & time: 01/25/17  1351     History   Chief Complaint Chief Complaint  Patient presents with  . Fall  . Ankle Pain  . Hypotension    HPI [name] is a 51 y.o. male.Patient had a syncopal event 1 week ago as result of the syncopal event he injured his right ankle and left knee. He is been unable to weight-bear on either leg since the event. He has been unable to get to the bathroom except with the assistance of his husband and with difficulty. Pain is worse with weightbearing left knee and the right ankle. Also complains of left thumb pain as result of fall. Denies chest pain denies headache denies abdominal pain denies difficulty breathing. Patient also reports he had syncopal event approximately 2 weeks ago  HPI  Past Medical History:  Diagnosis Date  . CHF (congestive heart failure) (HCC)   . Chronic systolic heart failure (HCC)    a. Echo 12/05/11:  EF 20-25%, diff HK with mild sparing of IL wall, mild AI, mod MR, mild LAE, mild RVE, mild reduced RVF.;  b.  Echo 05/11/2012:  Mild LVH, EF 20-25%, Gr 1 diast dysfn, mod MR, mild LAE  . Depression   . G6PD deficiency (HCC)   . GERD (gastroesophageal reflux disease)   . Hepatitis    Hep B and Hep C (patient does not report this but these are listed in previous notes.)  . HIV infection (HCC)   . Hypertension   . NICM (nonischemic cardiomyopathy) (HCC)    cardia CTA 8/13 negative for obstructive CAD    Patient Active Problem List   Diagnosis Date Noted  . Skin rash   . Elevated liver enzymes   . Hypotension 12/18/2016  . Scabies 09/17/2016  . Screening examination for venereal disease 03/21/2015  . Encounter for long-term (current) use of medications 03/21/2015  . Pain in joint, ankle and foot 03/21/2015  . Insomnia 06/26/2014  . Dehydration 02/22/2014  . AKI (acute kidney injury) (HCC) 02/22/2014  . Hypokalemia 02/04/2014  . Ventricular tachycardia,  polymorphic (HCC) 02/03/2014  . Headache 02/03/2014  . PTSD (post-traumatic stress disorder) 02/03/2014  . Implantable cardioverter-defibrillator (ICD) discharge 01/27/2014  . Sustained VT (ventricular tachycardia) (HCC) 01/27/2014  . ICD (implantable cardioverter-defibrillator), biventricular, in situ 01/23/2014  . Paroxysmal ventricular fibrillation (HCC) 01/19/2014  . Gout attack 04/24/2013  . Protein-calorie malnutrition, severe (HCC) 02/13/2013  . GERD (gastroesophageal reflux disease) 08/19/2012  . Chronic systolic heart failure (HCC) 01/25/2012  . Wide-complex tachycardia (HCC) 01/07/2012  . Cardiomyopathy (HCC) 12/07/2011  . Alcohol abuse 11/30/2011  . Marijuana abuse 11/30/2011  . Human immunodeficiency virus (HIV) disease (HCC) 06/09/2006  . GENITAL HERPES 06/09/2006  . Chronic hepatitis C without hepatic coma (HCC) 06/09/2006  . Essential hypertension 06/09/2006    Past Surgical History:  Procedure Laterality Date  . BI-VENTRICULAR PACEMAKER INSERTION N/A 06/26/2013   Procedure: BI-VENTRICULAR PACEMAKER INSERTION (CRT-P);  Surgeon: Marinus Maw, MD;  Location: [facility] CATH LAB;  Service: Cardiovascular;  Laterality: N/A;  . COLONOSCOPY WITH PROPOFOL N/A 08/14/2016   Procedure: COLONOSCOPY WITH PROPOFOL;  Surgeon: Jeani Hawking, MD;  Location: [facility] ENDOSCOPY;  Service: Endoscopy;  Laterality: N/A;  . ELECTROPHYSIOLOGY STUDY N/A 02/19/2012   Procedure: ELECTROPHYSIOLOGY STUDY;  Surgeon: Marinus Maw, MD;  Location: [facility] CATH LAB;  Service: Cardiovascular;  Laterality: N/A;  . ESOPHAGOGASTRODUODENOSCOPY  12/07/2011   Procedure: ESOPHAGOGASTRODUODENOSCOPY (EGD);  Surgeon: [name],  MD,FACG;  Location: [facility] ENDOSCOPY;  Service: Endoscopy;  Laterality: N/A;  . FINGER SURGERY     Thumb laceration.    Marland Kitchen LEFT HEART CATHETERIZATION WITH CORONARY ANGIOGRAM N/A 01/31/2014   Procedure: LEFT HEART CATHETERIZATION WITH CORONARY ANGIOGRAM;  Surgeon: Iran Ouch, MD;  Location: [facility] CATH LAB;   Service: Cardiovascular;  Laterality: N/A;       Home Medications    Prior to Admission medications   Medication Sig Start Date End Date Taking? Authorizing Provider  amiodarone (PACERONE) 200 MG tablet Take 1 tablet (200 mg total) by mouth daily. 04/15/16   Marinus Maw, MD  carvedilol (COREG) 25 MG tablet Take 0.5 tablets (12.5 mg total) by mouth 2 (two) times daily with a meal. 12/29/16 02/12/17  Laurey Morale, MD  clobetasol ointment (TEMOVATE) 0.05 % Apply 1 application topically 2 (two) times daily. 12/28/16   [provider]  colchicine 0.6 MG tablet Take 1 tablet (0.6 mg total) by mouth 2 (two) times daily as needed. For gout flares. 12/25/14   [name], MD  emtricitabine-rilpivir-tenofovir AF (ODEFSEY) 200-25-25 MG TABS tablet Take 1 tablet by mouth daily with breakfast. 09/11/16   Comer, [name], MD  ENTRESTO 97-103 MG Take 1 tablet by mouth 2 (two) times daily. 12/07/16   [provider]  famotidine (PEPCID) 20 MG tablet Take 1 tablet (20 mg total) by mouth 2 (two) times daily. 12/24/16   Palumbo, April, MD  furosemide (LASIX) 40 MG tablet Take 1 tablet (40 mg total) by mouth as needed for fluid or edema. 12/29/16 03/29/17  Laurey Morale, MD  hydrOXYzine (ATARAX/VISTARIL) 50 MG tablet take 1 tablet by mouth three times a day if needed 11/04/16   Elpidio Anis, PA-C  LORazepam (ATIVAN) 1 MG tablet Take 1 tablet (1 mg total) by mouth at bedtime. 11/04/16   Elpidio Anis, PA-C  mirtazapine (REMERON) 7.5 MG tablet Take 1 tablet (7.5 mg total) by mouth at bedtime. 07/02/16   Comer, [name], MD  potassium chloride SA (K-DUR,KLOR-CON) 20 MEQ tablet Take 1 tablet by mouth twice daily when you take Lasix. 10/20/16   Bensimhon, Bevelyn Buckles, MD  sacubitril-valsartan (ENTRESTO) 49-51 MG Take 1 tablet by mouth 2 (two) times daily. 12/29/16   Laurey Morale, MD  spironolactone (ALDACTONE) 25 MG tablet Take 1 tablet (25 mg total) by mouth daily. 12/29/16 03/29/17  Laurey Morale, MD  TIVICAY 50 MG tablet take 1 tablet by mouth once daily WITH LUNCH 09/11/16   Comer, [name], MD  triamcinolone cream (KENALOG) 0.1 % Apply 1 application topically 3 (three) times daily. 01/14/17   [provider]  valACYclovir (VALTREX) 1000 MG tablet Take 1 tablet (1,000 mg total) by mouth 2 (two) times daily as needed (outbreaks). Take for 5 days for an outbreak 01/19/17   Comer, [name], MD    Family History Family History  Problem Relation Age of Onset  . Lung cancer Mother   . Colon cancer Unknown 65    Social History Social History  Substance Use Topics  . Smoking status: Never Smoker  . Smokeless tobacco: Never Used  . Alcohol use 0.6 oz/week    1 Cans of beer per week     Comment: occ beer     Allergies   Bactrim   Review of Systems Review of Systems  Constitutional: Negative.   HENT: Negative.   Respiratory: Negative.   Cardiovascular: Negative.  Syncope  Gastrointestinal: Negative.   Musculoskeletal: Positive for arthralgias and gait problem.  Skin: Positive for rash.       Diffuse Rash for 4 months  Psychiatric/Behavioral: Negative.   All other systems reviewed and are negative.    Physical Exam Updated Vital Signs BP 92/69 (BP Location: Left Arm)   Pulse 80   Temp 98.2 F (36.8 C) (Oral)   Resp 18   SpO2 96%   Physical Exam  Constitutional: He is oriented to person, place, and time.  Chronically ill-appearing  HENT:  Head: Normocephalic and atraumatic.  Eyes: Pupils are equal, round, and reactive to light. Conjunctivae are normal.  Neck: Neck supple. No tracheal deviation present. No thyromegaly present.  Cardiovascular: Normal rate and regular rhythm.   No murmur heard. Pulmonary/Chest: Effort normal and breath sounds normal.  Abdominal: Soft. Bowel sounds are normal. He exhibits no distension. There is no tenderness.  Musculoskeletal: Normal range of motion. He exhibits no edema or tenderness.  Entire spine nontender.  Pelvis stable nontender. Left upper extremity skin intact. Ecchymotic at thumb without swelling. He is tender over proximal phalanx of the thumb. Full range of motion. No soft tissue swelling. Right lower extremity tender at knee. No ligamentous laxity no swelling or deformity. DP pulse 2+ good capillary refill. Right lower extremity swollen and tender ankle. Swollen and foot without tenderness. DP pulse 2+ good capillary refill. No tenderness over proximal fibula. Right upper extremity without contusion abrasion or tenderness neurovascular intact  Neurological: He is alert and oriented to person, place, and time. Coordination normal.  Skin: Skin is warm and dry. No rash noted.  Psychiatric: He has a normal mood and affect.  Nursing note and vitals reviewed.    ED Treatments / Results  Labs (all labs ordered are listed, but only abnormal results are displayed) Labs Reviewed  COMPREHENSIVE METABOLIC PANEL  CBC WITH DIFFERENTIAL/PLATELET  I-STAT TROPONIN, ED   Results for orders placed or performed during the hospital encounter of 01/25/17  Comprehensive metabolic panel  Result Value Ref Range   Sodium 140 135 - 145 mmol/L   Potassium 4.6 3.5 - 5.1 mmol/L   Chloride 108 101 - 111 mmol/L   CO2 20 (L) 22 - 32 mmol/L   Glucose, Bld 103 (H) 65 - 99 mg/dL   BUN 39 (H) 6 - 20 mg/dL   Creatinine, Ser 8.93 (H) 0.61 - 1.24 mg/dL   Calcium 9.3 8.9 - 73.4 mg/dL   Total Protein 8.0 6.5 - 8.1 g/dL   Albumin 3.4 (L) 3.5 - 5.0 g/dL   AST 93 (H) 15 - 41 U/L   ALT 68 (H) 17 - 63 U/L   Alkaline Phosphatase 148 (H) 38 - 126 U/L   Total Bilirubin 1.9 (H) 0.3 - 1.2 mg/dL   GFR calc non Af Amer 21 (L) >60 mL/min   GFR calc Af Amer 24 (L) >60 mL/min   Anion gap 12 5 - 15  CBC with Differential/Platelet  Result Value Ref Range   WBC 9.5 4.0 - 10.5 K/uL   RBC 4.41 4.22 - 5.81 MIL/uL   Hemoglobin 13.7 13.0 - 17.0 g/dL   HCT 28.7 68.1 - 15.7 %   MCV 94.1 78.0 - 100.0 fL   MCH 31.1 26.0 - 34.0 pg    MCHC 33.0 30.0 - 36.0 g/dL   RDW 26.2 03.5 - 59.7 %   Platelets 153 150 - 400 K/uL   Neutrophils Relative % 57 %   Neutro Abs  5.5 1.7 - 7.7 K/uL   Lymphocytes Relative 34 %   Lymphs Abs 3.2 0.7 - 4.0 K/uL   Monocytes Relative 6 %   Monocytes Absolute 0.6 0.1 - 1.0 K/uL   Eosinophils Relative 2 %   Eosinophils Absolute 0.2 0.0 - 0.7 K/uL   Basophils Relative 1 %   Basophils Absolute 0.1 0.0 - 0.1 K/uL  I-stat troponin, ED  Result Value Ref Range   Troponin i, poc 0.00 0.00 - 0.08 ng/mL   Comment 3           Dg Chest 2 View  Result Date: 01/25/2017 CLINICAL DATA:  Fall.  Chest pain. EXAM: CHEST  2 VIEW COMPARISON:  01/27/2014.  02/12/2013.  CT 01/20/2013.  CT 01/20/2012 FINDINGS: AICD in stable position. Heart size normal. Stable left base pleuroparenchymal thickening consistent with scarring. Follow-up chest x-ray recommended to exclude an underlying focal lesion/mass. No pleural effusion or pneumothorax. No evidence of displaced rib fracture. IMPRESSION: 1. Stable left base pleuroparenchymal thickening consistent with scarring. No acute abnormality identified . 2. AICD in stable position . Electronically Signed   By: Maisie Fus  Register   On: 01/25/2017 16:33   Dg Ankle Complete Right  Result Date: 01/25/2017 CLINICAL DATA:  Continued acute right ankle pain following fall 1 week ago. Initial encounter. EXAM: RIGHT ANKLE - COMPLETE 3+ VIEW COMPARISON:  None. FINDINGS: An oblique fracture of the distal fibula (4 mm lateral/ posterior displacement), a comminuted fracture the medial malleolus, and a nondisplaced fracture of the posterior tibial malleolus noted. A 1.5 cm bony fragment anterior to the joint on the lateral view is noted, likely displaced from the distal tibia. There is no evidence of subluxation or dislocation. IMPRESSION: Trimalleolar fractures as described. 1.5 cm bony fragment anterior to the ankle joint on the lateral view likely displaced from the distal tibia. Electronically  Signed   By: Harmon Pier M.D.   On: 01/25/2017 16:31   Dg Knee Complete 4 Views Left  Result Date: 01/25/2017 CLINICAL DATA:  Patient with diffuse knee pain status post fall. EXAM: LEFT KNEE - COMPLETE 4+ VIEW COMPARISON:  Left knee radiograph 11/23/2012. FINDINGS: Distal femur is unremarkable. There is a mildly distracted oblique fracture through the mid aspect of the proximal tibia extending to the articular surface near the tibial spines. Proximal fibula is unremarkable. Regional soft tissues are unremarkable. IMPRESSION: Fracture of the proximal tibia extending to the articular surface. Electronically Signed   By: Annia Belt M.D.   On: 01/25/2017 16:34   Dg Hand Complete Left  Result Date: 01/25/2017 CLINICAL DATA:  Left hand pain secondary to a fall one week ago. EXAM: LEFT HAND - COMPLETE 3+ VIEW COMPARISON:  None. FINDINGS: There is a slightly displaced the hand angulated fracture of the base of the first metacarpal. No dislocation.  The other bones appear normal. IMPRESSION: Fracture of the base of the proximal first metacarpal with slight angulation and displacement. Electronically Signed   By: Francene Boyers M.D.   On: 01/25/2017 16:40   Dg Finger Thumb Left  Result Date: 01/25/2017 CLINICAL DATA:  Acute left thumb pain following fall 1 week ago. Initial encounter. EXAM: LEFT THUMB 2+V COMPARISON:  None. FINDINGS: A fracture of the proximal first metacarpal is identified and does not appear to extend to the carpometacarpal joint. Mild apex dorsal angulation is noted. No subluxation or dislocation. IMPRESSION: Proximal first metacarpal fracture. Electronically Signed   By: Harmon Pier M.D.   On: 01/25/2017 16:33  EKG  EKG Interpretation  Date/Time:  Monday January 25 2017 15:31:19 EDT Ventricular Rate:  74 PR Interval:    QRS Duration: 109 QT Interval:  420 QTC Calculation: 466 R Axis:   98 Text Interpretation:  Atrial-sensed ventricular-paced rhythm No further analysis attempted  due to paced rhythm No significant change since last tracing Confirmed by Doug Sou [phone]) on 01/25/2017 3:35:01 PM       Radiology No results found.  Procedures Procedures (including critical care time)  Medications Ordered in ED Medications - No data to display   Initial Impression / Assessment and Plan / ED Course  I have reviewed the triage vital signs and the nursing notes.  Pertinent labs & imaging results that were available during my care of the patient were reviewed by me and considered in my medical decision making (see chart for details).     I consulted cardiology who will see patient in Hospital tomorrow. Patient's pacemaker AICD was interrogated. There were no abnormal events one week ago. I consulted Dr. Eulah Pont from orthopedics. He requested patient be transferred to [facility] and he will evaluate patient tomorrow for surgery and ORIF of right ankle. I consulted Dr.Doutova from hospital service who will arrange for admission and transfer to [facility]  IV fluid normal saline bolus ordered as patient hypotensive and acute kidney injury.  Thumb spica splint was placed on left upper extremity knee immobilizer on left lower extremity and posterior short-leg splint placed on right lower extremity by orthopedic technician. All are comfortable for patient. 7 PM pain improved after treatment with intravenous fentanyl Final Clinical Impressions(s) / ED Diagnoses  Diagnosis #1 syncope Final diagnoses:  None   #2 hypotension 3 acute kidney injury #4 closed fracture of right ankle #5 closed fracture of left hand #6closed fracture of left proximal tibia New Prescriptions New Prescriptions   No medications on file     Doug Sou, MD 01/25/17 Julian Reil    Doug Sou, MD 01/25/17 2315

## 2017-01-26 ENCOUNTER — Encounter (HOSPITAL_COMMUNITY): Payer: Self-pay | Admitting: Anesthesiology

## 2017-01-26 ENCOUNTER — Encounter: Payer: Self-pay | Admitting: Cardiology

## 2017-01-26 ENCOUNTER — Inpatient Hospital Stay (HOSPITAL_COMMUNITY): Payer: Medicare Other | Admitting: Anesthesiology

## 2017-01-26 ENCOUNTER — Inpatient Hospital Stay (HOSPITAL_COMMUNITY): Payer: Medicare Other

## 2017-01-26 ENCOUNTER — Encounter (HOSPITAL_COMMUNITY): Payer: Medicare Other

## 2017-01-26 ENCOUNTER — Encounter (HOSPITAL_COMMUNITY)
Admission: EM | Disposition: A | Discharge status: Skilled Nursing Facility | Payer: Self-pay | Source: Home / Self Care | Attending: Internal Medicine

## 2017-01-26 DIAGNOSIS — N179 Acute kidney failure, unspecified: Secondary | ICD-10-CM

## 2017-01-26 DIAGNOSIS — I5022 Chronic systolic (congestive) heart failure: Secondary | ICD-10-CM

## 2017-01-26 DIAGNOSIS — I428 Other cardiomyopathies: Secondary | ICD-10-CM

## 2017-01-26 DIAGNOSIS — R55 Syncope and collapse: Secondary | ICD-10-CM

## 2017-01-26 DIAGNOSIS — S82891D Other fracture of right lower leg, subsequent encounter for closed fracture with routine healing: Secondary | ICD-10-CM

## 2017-01-26 DIAGNOSIS — I351 Nonrheumatic aortic (valve) insufficiency: Secondary | ICD-10-CM

## 2017-01-26 HISTORY — PX: ORIF ANKLE FRACTURE: SHX5408

## 2017-01-26 LAB — URINALYSIS, COMPLETE (UACMP) WITH MICROSCOPIC
Bacteria, UA: NONE SEEN
Bilirubin Urine: NEGATIVE
GLUCOSE, UA: NEGATIVE mg/dL
HGB URINE DIPSTICK: NEGATIVE
KETONES UR: NEGATIVE mg/dL
Leukocytes, UA: NEGATIVE
Nitrite: NEGATIVE
PROTEIN: NEGATIVE mg/dL
Specific Gravity, Urine: 1.018 (ref 1.005–1.030)
Squamous Epithelial / LPF: NONE SEEN
pH: 5 (ref 5.0–8.0)

## 2017-01-26 LAB — COMPREHENSIVE METABOLIC PANEL
ALBUMIN: 2.9 g/dL — AB (ref 3.5–5.0)
ALK PHOS: 109 U/L (ref 38–126)
ALT: 56 U/L (ref 17–63)
AST: 84 U/L — AB (ref 15–41)
Anion gap: 9 (ref 5–15)
BILIRUBIN TOTAL: 1.7 mg/dL — AB (ref 0.3–1.2)
BUN: 36 mg/dL — AB (ref 6–20)
CALCIUM: 8.9 mg/dL (ref 8.9–10.3)
CO2: 19 mmol/L — ABNORMAL LOW (ref 22–32)
CREATININE: 2.36 mg/dL — AB (ref 0.61–1.24)
Chloride: 110 mmol/L (ref 101–111)
GFR calc Af Amer: 35 mL/min — ABNORMAL LOW (ref >60)
GFR, EST NON AFRICAN AMERICAN: 30 mL/min — AB (ref >60)
GLUCOSE: 95 mg/dL (ref 65–99)
Potassium: 4.5 mmol/L (ref 3.5–5.1)
Sodium: 138 mmol/L (ref 135–145)
TOTAL PROTEIN: 6.6 g/dL (ref 6.5–8.1)

## 2017-01-26 LAB — TROPONIN I: Troponin I: <0.03 ng/mL (ref <0.03)

## 2017-01-26 LAB — CREATININE, URINE, RANDOM: Creatinine, Urine: 218.62 mg/dL

## 2017-01-26 LAB — CBC WITH DIFFERENTIAL/PLATELET
Basophils Absolute: 0 10*3/uL (ref 0.0–0.1)
Basophils Relative: 1 %
EOS ABS: 0.2 10*3/uL (ref 0.0–0.7)
EOS PCT: 2 %
HCT: 39.1 % (ref 39.0–52.0)
HEMOGLOBIN: 12.2 g/dL — AB (ref 13.0–17.0)
LYMPHS ABS: 3.3 10*3/uL (ref 0.7–4.0)
Lymphocytes Relative: 39 %
MCH: 29.8 pg (ref 26.0–34.0)
MCHC: 31.2 g/dL (ref 30.0–36.0)
MCV: 95.4 fL (ref 78.0–100.0)
MONO ABS: 0.6 10*3/uL (ref 0.1–1.0)
MONOS PCT: 7 %
NEUTROS PCT: 51 %
Neutro Abs: 4.5 10*3/uL (ref 1.7–7.7)
Platelets: 138 10*3/uL — ABNORMAL LOW (ref 150–400)
RBC: 4.1 MIL/uL — ABNORMAL LOW (ref 4.22–5.81)
RDW: 15.1 % (ref 11.5–15.5)
WBC: 8.7 10*3/uL (ref 4.0–10.5)

## 2017-01-26 LAB — HEMOGLOBIN A1C
Hgb A1c MFr Bld: 4.4 % — ABNORMAL LOW (ref 4.8–5.6)
Mean Plasma Glucose: 79.58 mg/dL

## 2017-01-26 LAB — TYPE AND SCREEN
ABO/RH(D): O NEG
Antibody Screen: NEGATIVE

## 2017-01-26 LAB — ECHOCARDIOGRAM COMPLETE
Height: 64 in
WEIGHTICAEL: 2592 [oz_av]

## 2017-01-26 LAB — SURGICAL PCR SCREEN
MRSA, PCR: NEGATIVE
Staphylococcus aureus: POSITIVE — AB

## 2017-01-26 LAB — GLUCOSE, CAPILLARY
Glucose-Capillary: 100 mg/dL — ABNORMAL HIGH (ref 65–99)
Glucose-Capillary: 86 mg/dL (ref 65–99)

## 2017-01-26 LAB — PHOSPHORUS: Phosphorus: 5 mg/dL — ABNORMAL HIGH (ref 2.5–4.6)

## 2017-01-26 LAB — SODIUM, URINE, RANDOM: Sodium, Ur: 61 mmol/L

## 2017-01-26 LAB — MAGNESIUM: Magnesium: 2 mg/dL (ref 1.7–2.4)

## 2017-01-26 LAB — CORTISOL-AM, BLOOD: Cortisol - AM: 2.5 ug/dL — ABNORMAL LOW (ref 6.7–22.6)

## 2017-01-26 SURGERY — OPEN REDUCTION INTERNAL FIXATION (ORIF) ANKLE FRACTURE
Anesthesia: General | Site: Ankle | Laterality: Right

## 2017-01-26 MED ORDER — ONDANSETRON HCL 4 MG/2ML IJ SOLN
4.0000 mg | Freq: Four times a day (QID) | INTRAMUSCULAR | Status: DC | PRN
  Administered 2017-01-26 – 2017-01-28 (×2): 4 mg via INTRAVENOUS
  Filled 2017-01-26 (×2): qty 2

## 2017-01-26 MED ORDER — LACTATED RINGERS IV SOLN
INTRAVENOUS | Status: DC
  Administered 2017-01-26 – 2017-01-27 (×2): via INTRAVENOUS

## 2017-01-26 MED ORDER — PHENYLEPHRINE HCL 10 MG/ML IJ SOLN
INTRAMUSCULAR | Status: DC | PRN
  Administered 2017-01-26: 25 ug/min via INTRAVENOUS

## 2017-01-26 MED ORDER — BUPIVACAINE-EPINEPHRINE (PF) 0.5% -1:200000 IJ SOLN
INTRAMUSCULAR | Status: DC | PRN
  Administered 2017-01-26: 30 mL via PERINEURAL

## 2017-01-26 MED ORDER — HYDROCODONE-ACETAMINOPHEN 5-325 MG PO TABS: 1.0000 | ORAL_TABLET | ORAL | 0 refills | Status: DC | PRN

## 2017-01-26 MED ORDER — HYDROCODONE-ACETAMINOPHEN 5-325 MG PO TABS
1.0000 | ORAL_TABLET | ORAL | Status: DC | PRN
  Administered 2017-01-26 – 2017-01-27 (×3): 2 via ORAL
  Filled 2017-01-26 (×3): qty 2

## 2017-01-26 MED ORDER — ACETAMINOPHEN 650 MG RE SUPP
650.0000 mg | Freq: Four times a day (QID) | RECTAL | Status: DC | PRN
Start: 2017-01-26 — End: 2017-01-29

## 2017-01-26 MED ORDER — FENTANYL CITRATE (PF) 100 MCG/2ML IJ SOLN
INTRAMUSCULAR | Status: DC | PRN
  Administered 2017-01-26: 100 ug via INTRAVENOUS
  Administered 2017-01-26: 50 ug via INTRAVENOUS
  Administered 2017-01-26 (×4): 25 ug via INTRAVENOUS

## 2017-01-26 MED ORDER — LACTATED RINGERS IV SOLN: INTRAVENOUS | Status: DC

## 2017-01-26 MED ORDER — CHLORHEXIDINE GLUCONATE CLOTH 2 % EX PADS
6.0000 | MEDICATED_PAD | Freq: Every day | CUTANEOUS | Status: DC
  Administered 2017-01-26 – 2017-01-29 (×3): 6 via TOPICAL

## 2017-01-26 MED ORDER — PHENYLEPHRINE HCL 10 MG/ML IJ SOLN
INTRAMUSCULAR | Status: DC | PRN
  Administered 2017-01-26: 80 ug via INTRAVENOUS

## 2017-01-26 MED ORDER — DEXAMETHASONE SODIUM PHOSPHATE 4 MG/ML IJ SOLN: INTRAMUSCULAR | Status: DC | PRN

## 2017-01-26 MED ORDER — ENOXAPARIN SODIUM 40 MG/0.4ML ~~LOC~~ SOLN
40.0000 mg | SUBCUTANEOUS | Status: DC
  Administered 2017-01-27 – 2017-01-29 (×3): 40 mg via SUBCUTANEOUS
  Filled 2017-01-26 (×3): qty 0.4

## 2017-01-26 MED ORDER — ACETAMINOPHEN 325 MG PO TABS: 650.0000 mg | ORAL_TABLET | Freq: Four times a day (QID) | ORAL | Status: DC | PRN

## 2017-01-26 MED ORDER — 0.9 % SODIUM CHLORIDE (POUR BTL) OPTIME
TOPICAL | Status: DC | PRN
  Administered 2017-01-26: 1000 mL

## 2017-01-26 MED ORDER — FENTANYL CITRATE (PF) 250 MCG/5ML IJ SOLN
INTRAMUSCULAR | Status: AC
  Filled 2017-01-26: qty 5

## 2017-01-26 MED ORDER — MIDAZOLAM HCL 5 MG/5ML IJ SOLN
INTRAMUSCULAR | Status: DC | PRN
  Administered 2017-01-26: 2 mg via INTRAVENOUS

## 2017-01-26 MED ORDER — CEFAZOLIN SODIUM-DEXTROSE 2-4 GM/100ML-% IV SOLN
2.0000 g | INTRAVENOUS | Status: AC
  Administered 2017-01-26: 2 g via INTRAVENOUS
  Filled 2017-01-26: qty 100

## 2017-01-26 MED ORDER — PROPOFOL 10 MG/ML IV BOLUS
INTRAVENOUS | Status: AC
  Filled 2017-01-26: qty 20

## 2017-01-26 MED ORDER — MUPIROCIN 2 % EX OINT
1.0000 "application " | TOPICAL_OINTMENT | Freq: Two times a day (BID) | CUTANEOUS | Status: DC
  Administered 2017-01-26 – 2017-01-29 (×6): 1 via NASAL
  Filled 2017-01-26 (×2): qty 22

## 2017-01-26 MED ORDER — CEFAZOLIN SODIUM-DEXTROSE 1-4 GM/50ML-% IV SOLN
1.0000 g | Freq: Four times a day (QID) | INTRAVENOUS | Status: AC
  Administered 2017-01-26 – 2017-01-27 (×3): 1 g via INTRAVENOUS
  Filled 2017-01-26 (×3): qty 50

## 2017-01-26 MED ORDER — ASPIRIN EC 325 MG PO TBEC: 325.0000 mg | DELAYED_RELEASE_TABLET | Freq: Every day | ORAL | 0 refills | Status: DC

## 2017-01-26 MED ORDER — LIDOCAINE HCL (CARDIAC) 20 MG/ML IV SOLN
INTRAVENOUS | Status: DC | PRN
  Administered 2017-01-26: 50 mg via INTRAVENOUS

## 2017-01-26 MED ORDER — LIDOCAINE-EPINEPHRINE (PF) 1.5 %-1:200000 IJ SOLN
INTRAMUSCULAR | Status: DC | PRN
  Administered 2017-01-26: 30 mL via PERINEURAL

## 2017-01-26 SURGICAL SUPPLY — 71 items
BANDAGE ACE 4X5 VEL STRL LF (GAUZE/BANDAGES/DRESSINGS) ×3 IMPLANT
BANDAGE ACE 6X5 VEL STRL LF (GAUZE/BANDAGES/DRESSINGS) ×1 IMPLANT
BANDAGE ESMARK 6X9 LF (GAUZE/BANDAGES/DRESSINGS) ×1 IMPLANT
BIT DRILL 2.5X125 (BIT) ×2 IMPLANT
BIT DRILL 3.5X125 (BIT) IMPLANT
BIT DRILL CANN 2.7 (BIT) ×2
BIT DRILL SRG 2.7XCANN AO CPLG (BIT) IMPLANT
BIT DRL SRG 2.7XCANN AO CPLNG (BIT) ×1
BNDG CMPR 9X6 STRL LF SNTH (GAUZE/BANDAGES/DRESSINGS) ×1
BNDG COHESIVE 4X5 TAN STRL (GAUZE/BANDAGES/DRESSINGS) ×1 IMPLANT
BNDG ESMARK 6X9 LF (GAUZE/BANDAGES/DRESSINGS) ×2
CANISTER SUCT 3000ML PPV (MISCELLANEOUS) ×2 IMPLANT
COVER SURGICAL LIGHT HANDLE (MISCELLANEOUS) ×2 IMPLANT
CUFF TOURNIQUET SINGLE 34IN LL (TOURNIQUET CUFF) IMPLANT
DRAPE C-ARM 42X72 X-RAY (DRAPES) IMPLANT
DRAPE C-ARMOR (DRAPES) IMPLANT
DRAPE OEC MINIVIEW 54X84 (DRAPES) ×2 IMPLANT
DRAPE ORTHO SPLIT 77X108 STRL (DRAPES)
DRAPE SURG ORHT 6 SPLT 77X108 (DRAPES) IMPLANT
DRAPE U-SHAPE 47X51 STRL (DRAPES) IMPLANT
DRILL BIT 3.5X125 (BIT) ×2
DRSG ADAPTIC 3X8 NADH LF (GAUZE/BANDAGES/DRESSINGS) ×1 IMPLANT
DRSG PAD ABDOMINAL 8X10 ST (GAUZE/BANDAGES/DRESSINGS) ×5 IMPLANT
DURAPREP 26ML APPLICATOR (WOUND CARE) ×2 IMPLANT
ELECT REM PT RETURN 9FT ADLT (ELECTROSURGICAL) ×2
ELECTRODE REM PT RTRN 9FT ADLT (ELECTROSURGICAL) ×1 IMPLANT
GAUZE SPONGE 4X4 12PLY STRL (GAUZE/BANDAGES/DRESSINGS) ×2 IMPLANT
GLOVE BIO SURGEON STRL SZ7.5 (GLOVE) ×4 IMPLANT
GLOVE BIO SURGEON STRL SZ8 (GLOVE) ×2 IMPLANT
GLOVE BIOGEL PI IND STRL 8 (GLOVE) ×2 IMPLANT
GLOVE BIOGEL PI INDICATOR 8 (GLOVE) ×2
GOWN STRL REUS W/ TWL LRG LVL3 (GOWN DISPOSABLE) ×2 IMPLANT
GOWN STRL REUS W/ TWL XL LVL3 (GOWN DISPOSABLE) ×1 IMPLANT
GOWN STRL REUS W/TWL LRG LVL3 (GOWN DISPOSABLE) ×4
GOWN STRL REUS W/TWL XL LVL3 (GOWN DISPOSABLE) ×2
K-WIRE ORTHOPEDIC 1.4X150L (WIRE) ×6
KIT BASIN OR (CUSTOM PROCEDURE TRAY) ×2 IMPLANT
KIT ROOM TURNOVER OR (KITS) ×2 IMPLANT
KWIRE ORTHOPEDIC 1.4X150L (WIRE) IMPLANT
MANIFOLD NEPTUNE II (INSTRUMENTS) ×2 IMPLANT
NEEDLE 22X1 1/2 (OR ONLY) (NEEDLE) ×2 IMPLANT
NS IRRIG 1000ML POUR BTL (IV SOLUTION) ×2 IMPLANT
PACK ORTHO EXTREMITY (CUSTOM PROCEDURE TRAY) ×2 IMPLANT
PAD ARMBOARD 7.5X6 YLW CONV (MISCELLANEOUS) ×4 IMPLANT
PAD CAST 4YDX4 CTTN HI CHSV (CAST SUPPLIES) ×2 IMPLANT
PADDING CAST COTTON 4X4 STRL (CAST SUPPLIES) ×4
PADDING CAST COTTON 6X4 STRL (CAST SUPPLIES) ×2 IMPLANT
PLATE TUBUAL 1/3 6H (Plate) ×1 IMPLANT
SCREW BONE CANNULATED 4X44MM (Screw) ×2 IMPLANT
SCREW CANC FT 16X4X2.5XHEX (Screw) IMPLANT
SCREW CANCELLOUS 4.0X14 (Screw) ×1 IMPLANT
SCREW CANCELLOUS 4.0X16MM (Screw) ×2 IMPLANT
SCREW CORTEX ST MATTA 3.5X14 (Screw) ×2 IMPLANT
SCREW CORTEX ST MATTA 3.5X16MM (Screw) ×1 IMPLANT
SPONGE LAP 18X18 X RAY DECT (DISPOSABLE) ×2 IMPLANT
STRIP CLOSURE SKIN 1/2X4 (GAUZE/BANDAGES/DRESSINGS) ×2 IMPLANT
SUCTION FRAZIER HANDLE 10FR (MISCELLANEOUS) ×1
SUCTION TUBE FRAZIER 10FR DISP (MISCELLANEOUS) ×1 IMPLANT
SUT ETHILON 3 0 PS 1 (SUTURE) ×3 IMPLANT
SUT MNCRL AB 4-0 PS2 18 (SUTURE) ×2 IMPLANT
SUT MON AB 2-0 CT1 27 (SUTURE) IMPLANT
SUT VIC AB 0 CT1 27 (SUTURE)
SUT VIC AB 0 CT1 27XBRD ANBCTR (SUTURE) IMPLANT
SYR BULB IRRIGATION 50ML (SYRINGE) ×2 IMPLANT
SYR CONTROL 10ML LL (SYRINGE) IMPLANT
TOWEL OR 17X24 6PK STRL BLUE (TOWEL DISPOSABLE) ×2 IMPLANT
TOWEL OR 17X26 10 PK STRL BLUE (TOWEL DISPOSABLE) ×2 IMPLANT
TUBE CONNECTING 12X1/4 (SUCTIONS) ×2 IMPLANT
UNDERPAD 30X30 (UNDERPADS AND DIAPERS) ×4 IMPLANT
WATER STERILE IRR 1000ML POUR (IV SOLUTION) ×2 IMPLANT
YANKAUER SUCT BULB TIP NO VENT (SUCTIONS) IMPLANT

## 2017-01-26 NOTE — Transfer of Care (Signed)
Immediate Anesthesia Transfer of Care Note  Patient: [name]  Procedure(s) Performed: Procedure(s): OPEN REDUCTION INTERNAL FIXATION (ORIF) ANKLE FRACTURE (Right)  Patient Location: PACU  Anesthesia Type:General  Level of Consciousness: awake, alert  and oriented  Airway & Oxygen Therapy: Patient Spontanous Breathing and Patient connected to nasal cannula oxygen  Post-op Assessment: Report given to RN and Post -op Vital signs reviewed and stable  Post vital signs: Reviewed and stable  Last Vitals:  Vitals:   01/26/17 0956 01/26/17 1351  BP: 122/88 (!) 149/89  Pulse: 68 69  Resp: 18 (!) 9  Temp: 37.1 C 36.8 C  SpO2: 99% 98%    Last Pain:  Vitals:   01/26/17 0956  TempSrc: Oral  PainSc:          Complications: No apparent anesthesia complications

## 2017-01-26 NOTE — Anesthesia Procedure Notes (Signed)
Date/Time: 01/26/2017 12:19 PM Performed by: Gwenyth Allegra Pre-anesthesia Checklist: Patient identified, Emergency Drugs available, Suction available, Timeout performed and Patient being monitored Patient Re-evaluated:Patient Re-evaluated prior to induction Oxygen Delivery Method: Circle system utilized Preoxygenation: Pre-oxygenation with 100% oxygen Induction Type: IV induction LMA Size: 4.0 Placement Confirmation: positive ETCO2 and breath sounds checked- equal and bilateral Tube secured with: Tape Dental Injury: Teeth and Oropharynx as per pre-operative assessment

## 2017-01-26 NOTE — Progress Notes (Signed)
PROGRESS NOTE                                                                                                                                                                                                             Patient Demographics:    [name], is a 51 y.o. male, DOB - 29-Jan-1966, RUE:[id number]  Admit date - 01/25/2017   Admitting Physician Therisa Doyne, MD  Outpatient Primary MD for the patient is Comer, [name], MD  LOS - 1  Outpatient Specialists: Heart failure Dr. Luciana Axe  Chief Complaint  Patient presents with  . Fall  . Ankle Pain  . Hypotension       Brief Narrative   51 year old male with history of HIV (CD4 count of 540, on ART), hepatitis C,nonischemic cardiomyopathy status post [employer] CRT-D, GERD, hypertension with prior syncopal episodes who had a syncopal episode at home one week back and since then was unable to ambulate independently due to pain. He did not come to the ED initially since he could not take the steps in his house to get out. Patient denies sustaining any head injury, seizures, chest pain, shortness of breath, nausea, vomiting, fevers, chills, abdominal pain, bowel or urinary symptoms. Patient was seen by cardiology one month back for lightheadedness and was found to be orthostatic. Suspected to be dehydration with Lasix which was held along with his Aldactone , BB and Entresto. All his meds were resumed about 2 weeks back.  Patient presented to the ED where his blood pressure was low (92/69 mmHg) . remaining vitals were stable.. Pacemaker was interrogated and was ruled out for V. Tach. Patient was found to have acute kidney injury with creatinine of 3.19 (baseline 1.2). X-ray showed fracture of the first metacarpal head, trimalleolar fracture of the right ankle and fracture of left proximal tibia,.  Admitted to hospitalist service on telemetry. Patient taken to OR today  and underwent ORIF of rt trimalleolar ankle fracture.       Subjective:   Seen after returning from OR . Denies any pain at present. deneis any chest pain or dizziness   Assessment  & Plan :    Principal Problem:   Closed right ankle fracture secondary to syncope. Underwent ORIF. Pain control with Vicodin and low-dose morphine. Orthopedics following. PT evaluation.  Active Problems: Orthostatic/vasovagal syncope All blood  pressure/heart failure medications have been held. hypertension with dehydration and acute kidney injury.receive gentle hydration. Husband reports passing out and being poorly responsive for several minutes thereafter. Also had urinary incontinence. Will get EEG to r/o seizures.  Acute kidney injury Secondary to dehydration and diuretics. All blood pressure medications and diuretic held. Avoid NSAIDs. Received  hydration with some improvement this morning. Check labs an a.m.   Chronic systolic CHF Holding all medications until blood pressure more stable.  Chronic hepatitis C LFTs stable.    Human immunodeficiency virus (HIV) disease (HCC) CD4 of 540. Continue ART.   Chronic alcohol use Reports drinking 3 beers a week. Currently on CIWA.    Closed fracture of base of first metacarpal of left hand Orthopedics recommend non operative management and maintain with a thumb spica.Nonweightbearing on left hand.     Closed fracture of left tibial plateau Non operative management. Nonweightbearing. Knee immobilizer and elevate leg /apply ice.      Code Status : full code  Family Communication  : none at bedside  Disposition Plan  : pending hospital course, PT evaluation  Barriers For Discharge : Postop  Consults  :   Dr Eulah Pont HF team  Procedures  :  CT left knee ORIF rt ankle fracture    DVT Prophylaxis  :  Lovenox -  Lab Results  Component Value Date   PLT 138 (L) 01/26/2017    Antibiotics  :    Anti-infectives    Start      Dose/Rate Route Frequency Ordered Stop   01/26/17 1215  ceFAZolin (ANCEF) IVPB 2g/100 mL premix     2 g 200 mL/hr over 30 Minutes Intravenous To Surgery 01/26/17 1207 01/26/17 1225   01/26/17 1000  dolutegravir (TIVICAY) tablet 50 mg     50 mg Oral Daily 01/25/17 2250     01/26/17 0900  emtricitabine-rilpivir-tenofovir AF (ODEFSEY) 200-25-25 MG per tablet 1 tablet     1 tablet Oral Daily with breakfast 01/25/17 2250          Objective:   Vitals:   01/26/17 1415 01/26/17 1430 01/26/17 1445 01/26/17 1500  BP: (!) 126/93 119/89 119/89   Pulse: 67 69 68 67  Resp: (!) 23 15 16 14   Temp:   (!) 97.3 F (36.3 C)   TempSrc:      SpO2: 95% 93% 96% 95%  Weight:      Height:        Wt Readings from Last 3 Encounters:  01/25/17 73.5 kg (162 lb)  12/24/16 73 kg (161 lb)  12/22/16 73 kg (161 lb)     Intake/Output Summary (Last 24 hours) at 01/26/17 1514 Last data filed at 01/26/17 1330  Gross per 24 hour  Intake           1797.5 ml  Output              170 ml  Net           1627.5 ml     Physical Exam  Gen: not in distress HEENT: moist mucosa, supple neck Chest: clear b/l, no added sounds CVS: N S1&S2, no murmurs, rubs or gallop GI: soft, NT, ND, BS+ Musculoskeletal: warm, dressing over rt ankle, left knee immobilizer, ace wrap over left hand CNS: alert and oreinted    Data Review:    CBC  Recent Labs Lab 01/25/17 1655 01/26/17 0613  WBC 9.5 8.7  HGB 13.7 12.2*  HCT 41.5 39.1  PLT 153 138*  MCV 94.1 95.4  MCH 31.1 29.8  MCHC 33.0 31.2  RDW 15.2 15.1  LYMPHSABS 3.2 3.3  MONOABS 0.6 0.6  EOSABS 0.2 0.2  BASOSABS 0.1 0.0    Chemistries   Recent Labs Lab 01/25/17 1655 01/26/17 0613  NA 140 138  K 4.6 4.5  CL 108 110  CO2 20* 19*  GLUCOSE 103* 95  BUN 39* 36*  CREATININE 3.19* 2.36*  CALCIUM 9.3 8.9  MG  --  2.0  AST 93* 84*  ALT 68* 56  ALKPHOS 148* 109  BILITOT 1.9* 1.7*    ------------------------------------------------------------------------------------------------------------------ No results for input(s): CHOL, HDL, LDLCALC, TRIG, CHOLHDL, LDLDIRECT in the last 72 hours.  Lab Results  Component Value Date   HGBA1C 4.4 (L) 01/26/2017   ------------------------------------------------------------------------------------------------------------------ No results for input(s): TSH, T4TOTAL, T3FREE, THYROIDAB in the last 72 hours.  Invalid input(s): FREET3 ------------------------------------------------------------------------------------------------------------------ No results for input(s): VITAMINB12, FOLATE, FERRITIN, TIBC, IRON, RETICCTPCT in the last 72 hours.  Coagulation profile No results for input(s): INR, PROTIME in the last 168 hours.  No results for input(s): DDIMER in the last 72 hours.  Cardiac Enzymes  Recent Labs Lab 01/25/17 2315 01/26/17 0613  TROPONINI <0.03 <0.03   ------------------------------------------------------------------------------------------------------------------    Component Value Date/Time   BNP 49.5 12/18/2016 1144    Inpatient Medications  Scheduled Meds: . amiodarone  200 mg Oral Daily  . Chlorhexidine Gluconate Cloth  6 each Topical Daily  . clobetasol ointment  1 application Topical BID  . dolutegravir  50 mg Oral Daily  . emtricitabine-rilpivir-tenofovir AF  1 tablet Oral Q breakfast  . fentaNYL (SUBLIMAZE) injection  100 mcg Intravenous Once  . folic acid  1 mg Oral Daily  . LORazepam  1 mg Oral QHS  . multivitamin with minerals  1 tablet Oral Daily  . mupirocin ointment  1 application Nasal BID  . senna  1 tablet Oral BID  . sodium chloride flush  3 mL Intravenous Q12H  . thiamine  100 mg Oral Daily   Or  . thiamine  100 mg Intravenous Daily  . triamcinolone cream  1 application Topical TID   Continuous Infusions: . sodium chloride 75 mL/hr at 01/26/17 1035  . lactated ringers     . lactated ringers    . methocarbamol (ROBAXIN)  IV    . sodium chloride Stopped (01/25/17 2148)  . sodium chloride Stopped (01/25/17 2148)   PRN Meds:.HYDROcodone-acetaminophen, hydrOXYzine, LORazepam **OR** LORazepam, methocarbamol **OR** methocarbamol (ROBAXIN)  IV, mirtazapine, morphine injection  Micro Results Recent Results (from the past 240 hour(s))  Surgical pcr screen     Status: Abnormal   Collection Time: 01/25/17 11:12 PM  Result Value Ref Range Status   MRSA, PCR NEGATIVE NEGATIVE Final   Staphylococcus aureus POSITIVE (A) NEGATIVE Final    Comment:        The Xpert SA Assay (FDA approved for NASAL specimens in patients over 2 years of age), is one component of a comprehensive surveillance program.  Test performance has been validated by [facility] for patients greater than or equal to 1 year old. It is not intended to diagnose infection nor to guide or monitor treatment.     Radiology Reports Dg Chest 2 View  Result Date: 01/25/2017 CLINICAL DATA:  Fall.  Chest pain. EXAM: CHEST  2 VIEW COMPARISON:  01/27/2014.  02/12/2013.  CT 01/20/2013.  CT 01/20/2012 FINDINGS: AICD in stable position. Heart size normal. Stable left base pleuroparenchymal thickening consistent with scarring. Follow-up chest x-ray  recommended to exclude an underlying focal lesion/mass. No pleural effusion or pneumothorax. No evidence of displaced rib fracture. IMPRESSION: 1. Stable left base pleuroparenchymal thickening consistent with scarring. No acute abnormality identified . 2. AICD in stable position . Electronically Signed   By: Maisie Fus  Register   On: 01/25/2017 16:33   Dg Ankle Complete Right  Result Date: 01/25/2017 CLINICAL DATA:  Continued acute right ankle pain following fall 1 week ago. Initial encounter. EXAM: RIGHT ANKLE - COMPLETE 3+ VIEW COMPARISON:  None. FINDINGS: An oblique fracture of the distal fibula (4 mm lateral/ posterior displacement), a comminuted fracture the  medial malleolus, and a nondisplaced fracture of the posterior tibial malleolus noted. A 1.5 cm bony fragment anterior to the joint on the lateral view is noted, likely displaced from the distal tibia. There is no evidence of subluxation or dislocation. IMPRESSION: Trimalleolar fractures as described. 1.5 cm bony fragment anterior to the ankle joint on the lateral view likely displaced from the distal tibia. Electronically Signed   By: Harmon Pier M.D.   On: 01/25/2017 16:31   Ct Knee Left Wo Contrast  Result Date: 01/26/2017 CLINICAL DATA:  Proximal tibia fracture secondary to a fall yesterday. EXAM: CT OF THE LEFT KNEE WITHOUT CONTRAST TECHNIQUE: Multidetector CT imaging of the left knee was performed according to the standard protocol. Multiplanar CT image reconstructions were also generated. COMPARISON:  Radiographs dated 01/25/2017 FINDINGS: Bones/Joint/Cartilage There is an avulsion fracture of the posterior aspect of the medial tibial plateau extending across the midline involving the tibial insertion of the posterior cruciate ligament. There is minimal distraction of approximately 3 mm. There is no depression of fracture fragments. No other fractures.  Small joint effusion. Ligaments Suboptimally assessed by CT. ACL appears to be intact. Prominent soft tissue swelling adjacent to the MCL suggesting the possibility of a MCL sprain. LCL appears normal. Muscles and Tendons Negative. Soft tissues Negative. IMPRESSION: Large avulsion fracture involving the posterior aspect of the medial tibial plateau as well as the tibial attachment of the posterior cruciate ligament. Electronically Signed   By: Francene Boyers M.D.   On: 01/26/2017 10:19   Dg Knee Complete 4 Views Left  Result Date: 01/25/2017 CLINICAL DATA:  Patient with diffuse knee pain status post fall. EXAM: LEFT KNEE - COMPLETE 4+ VIEW COMPARISON:  Left knee radiograph 11/23/2012. FINDINGS: Distal femur is unremarkable. There is a mildly distracted  oblique fracture through the mid aspect of the proximal tibia extending to the articular surface near the tibial spines. Proximal fibula is unremarkable. Regional soft tissues are unremarkable. IMPRESSION: Fracture of the proximal tibia extending to the articular surface. Electronically Signed   By: Annia Belt M.D.   On: 01/25/2017 16:34   Dg Hand Complete Left  Result Date: 01/25/2017 CLINICAL DATA:  Left hand pain secondary to a fall one week ago. EXAM: LEFT HAND - COMPLETE 3+ VIEW COMPARISON:  None. FINDINGS: There is a slightly displaced the hand angulated fracture of the base of the first metacarpal. No dislocation.  The other bones appear normal. IMPRESSION: Fracture of the base of the proximal first metacarpal with slight angulation and displacement. Electronically Signed   By: Francene Boyers M.D.   On: 01/25/2017 16:40   Dg Finger Thumb Left  Result Date: 01/25/2017 CLINICAL DATA:  Acute left thumb pain following fall 1 week ago. Initial encounter. EXAM: LEFT THUMB 2+V COMPARISON:  None. FINDINGS: A fracture of the proximal first metacarpal is identified and does not appear to extend to  the carpometacarpal joint. Mild apex dorsal angulation is noted. No subluxation or dislocation. IMPRESSION: Proximal first metacarpal fracture. Electronically Signed   By: Harmon Pier M.D.   On: 01/25/2017 16:33    Time Spent in minutes 35   Eddie North M.D on 01/26/2017 at 3:14 PM  Between 7am to 7pm - Pager - [phone]  After 7pm go to [email or web address] - password [email or web address]  [facility] -  Office  [phone]

## 2017-01-26 NOTE — Interval H&P Note (Signed)
History and Physical Interval Note:  01/26/2017 12:37 PM  [name]  has presented today for surgery, with the diagnosis of fracture  The various methods of treatment have been discussed with the patient and family. After consideration of risks, benefits and other options for treatment, the patient has consented to  Procedure(s): OPEN REDUCTION INTERNAL FIXATION (ORIF) ANKLE FRACTURE (Right) as a surgical intervention .  The patient's history has been reviewed, patient examined, no change in status, stable for surgery.  I have reviewed the patient's chart and labs.  Questions were answered to the patient's satisfaction.     Ellen Goris, Vijay D

## 2017-01-26 NOTE — Anesthesia Procedure Notes (Signed)
Anesthesia Regional Block: Popliteal block   Pre-Anesthetic Checklist: ,, timeout performed, Correct Patient, Correct Site, Correct Laterality, Correct Procedure, Correct Position, site marked, Risks and benefits discussed,  Surgical consent,  Pre-op evaluation,  At surgeon's request and post-op pain management  Laterality: Right  Prep: chloraprep       Needles:  Injection technique: Single-shot  Needle Type: Echogenic Stimulator Needle     Needle Length: 10cm  Needle Gauge: 21     Additional Needles:   Procedures: ultrasound guided, nerve stimulator,,,,,,   Nerve Stimulator or Paresthesia:  Response: 0.4 mA,   Additional Responses:   Narrative:  Start time: 01/26/2017 11:55 AM End time: 01/26/2017 12:05 PM Injection made incrementally with aspirations every 5 mL.  Performed by: Personally  Anesthesiologist: Arta Bruce  Additional Notes: Monitors applied. Patient sedated. Sterile prep and drape,hand hygiene and sterile gloves were used. Relevant anatomy identified.Needle position confirmed.Local anesthetic injected incrementally after negative aspiration. Local anesthetic spread visualized around nerve(s). Vascular puncture avoided. No complications. Image printed for medical record.The patient tolerated the procedure well.  Additional Saphenous nerve block performed. 15cc Local Anesthetic mixture placed under ultrasonic guidance along the medio-inferior border of the Sartorious muscle 6 inches above the knee.  No Problems encountered.  Arta Bruce MD

## 2017-01-26 NOTE — Consult Note (Signed)
Advanced Heart Failure Team Consult Note  Primary Cardiologist:  Dr. [name]   Reason for Consultation: Syncope and collapse  HPI:    [name] is seen today for evaluation of syncope and collapse at the request of Dr. Gonzella Lex.   [name] is a 51 y.o. male  with a history of HIV 1998, HCV, ETOH abuse and CHF due to NICM s/p [employer]  CRT-D (06/2013). He also has a h/o syncope and previously had an EP study which was non-inducible for VT.   Last seen in HF clinic 12/18/16 as acute work in do to profound weakness. Found to be orthostatic with systolic in 60s on his arrival. Confounded by GI illness and watery diarrhea. Optivol interrogation with fluid index below threshold and thoracic impedence trending up (towards dry). Lasix stop and HF meds held. He was sent to ER. Noted to have marked AKI with Creatinine up to 4.37.   Meds held on discharge but patient was insistent on restarting. Follow up BMETs improved so meds resumed.   Pt presented to ED 01/25/17 after fall x 1 week with inability to bear weight on R ankle and pain in Left knee/ Left thumb.   Imaging revealed Closed ankle, wrist, and tibia fractures.   Pertinent labs on admission include creatinine 3.19, K 4.6, WBC 8.7, Hgb 12.2. HF team asked to see for med adjustments with syncope and collapse + hypotension.    Feeling OK this am. Lightheadedness somewhat improved. Pain well controlled. Frustrated that his BP has been low and that he fell.  To OR this am for ORIF of R ankle. He has previously been adamant about not wanting to stop his Sherryll Burger, as he has been on for years. He has been lightheaded for the past several weeks since restarting meds.   Review of Systems: [y] = yes, [ ]  = no   General: Weight gain [ ] ; Weight loss [ ] ; Anorexia [ ] ; Fatigue [y]; Fever [ ] ; Chills [ ] ; Weakness [y]  Cardiac: Chest pain/pressure [ ] ; Resting SOB [ ] ; Exertional SOB [ ] ; Orthopnea [ ] ; Pedal Edema [ ] ; Palpitations [ ] ;  Syncope [y]; Presyncope [y]; Paroxysmal nocturnal dyspnea[ ]   Pulmonary: Cough [ ] ; Wheezing[ ] ; Hemoptysis[ ] ; Sputum [ ] ; Snoring [ ]   GI: Vomiting[ ] ; Dysphagia[ ] ; Melena[ ] ; Hematochezia [ ] ; Heartburn[ ] ; Abdominal pain [ ] ; Constipation [ ] ; Diarrhea [ ] ; BRBPR [ ]   GU: Hematuria[ ] ; Dysuria [ ] ; Nocturia[ ]   Vascular: Pain in legs with walking [ ] ; Pain in feet with lying flat [ ] ; Non-healing sores [ ] ; Stroke [ ] ; TIA [ ] ; Slurred speech [ ] ;  Neuro: Headaches[ ] ; Vertigo[ ] ; Seizures[ ] ; Paresthesias[ ] ;Blurred vision [ ] ; Diplopia [ ] ; Vision changes [ ]   Ortho/Skin: Arthritis [ ] ; Joint pain [ ] ; Muscle pain [ ] ; Joint swelling [ ] ; Back Pain [ ] ; Rash [ ]   Psych: Depression[ ] ; Anxiety[ ]   Heme: Bleeding problems [ ] ; Clotting disorders [ ] ; Anemia [ ]   Endocrine: Diabetes [ ] ; Thyroid dysfunction[ ]   Home Medications Prior to Admission medications   Medication Sig Start Date End Date Taking? Authorizing Provider  amiodarone (PACERONE) 200 MG tablet Take 1 tablet (200 mg total) by mouth daily. 04/15/16  Yes Marinus Maw, MD  carvedilol (COREG) 25 MG tablet Take 0.5 tablets (12.5 mg total) by mouth 2 (two) times daily with a meal. 12/29/16 02/12/17 Yes Laurey Morale, MD  clobetasol ointment (TEMOVATE) 0.05 % Apply 1 application topically 2 (two) times daily. 12/28/16  Yes [provider]  colchicine 0.6 MG tablet Take 1 tablet (0.6 mg total) by mouth 2 (two) times daily as needed. For gout flares. 12/25/14  Yes ComerBelia Heman, MD  emtricitabine-rilpivir-tenofovir AF (ODEFSEY) 200-25-25 MG TABS tablet Take 1 tablet by mouth daily with breakfast. 09/11/16  Yes Comer, [name], MD  ENTRESTO 97-103 MG Take 1 tablet by mouth 2 (two) times daily. 12/07/16  Yes [provider]  famotidine (PEPCID) 20 MG tablet Take 1 tablet (20 mg total) by mouth 2 (two) times daily. Patient taking differently: Take 20 mg by mouth as needed.  12/24/16  Yes Palumbo, April, MD  furosemide  (LASIX) 40 MG tablet Take 1 tablet (40 mg total) by mouth as needed for fluid or edema. Patient taking differently: Take 40 mg by mouth 2 (two) times daily.  12/29/16 03/29/17 Yes Laurey Morale, MD  hydrOXYzine (ATARAX/VISTARIL) 50 MG tablet take 1 tablet by mouth three times a day if needed Patient taking differently: Take 50 mg by mouth 3 (three) times daily as needed for itching. take 1 tablet by mouth three times a day if needed 11/04/16  Yes Upstill, Shari, PA-C  ibuprofen (ADVIL,MOTRIN) 200 MG tablet Take 800 mg by mouth every 6 (six) hours as needed (pain).   Yes [provider]  LORazepam (ATIVAN) 1 MG tablet Take 1 tablet (1 mg total) by mouth at bedtime. 11/04/16  Yes Upstill, Melvenia Beam, PA-C  mirtazapine (REMERON) 7.5 MG tablet Take 1 tablet (7.5 mg total) by mouth at bedtime. 07/02/16  Yes Comer, [name], MD  potassium chloride SA (K-DUR,KLOR-CON) 20 MEQ tablet Take 1 tablet by mouth twice daily when you take Lasix. Patient taking differently: Take 10 mEq by mouth 2 (two) times daily. when you take Lasix. 10/20/16  Yes Laqueta Bonaventura, Bevelyn Buckles, MD  spironolactone (ALDACTONE) 25 MG tablet Take 1 tablet (25 mg total) by mouth daily. Patient taking differently: Take 12.5 mg by mouth daily.  12/29/16 03/29/17 Yes Laurey Morale, MD  TIVICAY 50 MG tablet take 1 tablet by mouth once daily WITH LUNCH 09/11/16  Yes Comer, [name], MD  triamcinolone cream (KENALOG) 0.1 % Apply 1 application topically 3 (three) times daily. 01/14/17  Yes [provider]  valACYclovir (VALTREX) 1000 MG tablet Take 1 tablet (1,000 mg total) by mouth 2 (two) times daily as needed (outbreaks). Take for 5 days for an outbreak 01/19/17  Yes Comer, [name], MD  sacubitril-valsartan (ENTRESTO) 49-51 MG Take 1 tablet by mouth 2 (two) times daily. Patient not taking: Reported on 01/25/2017 12/29/16   Laurey Morale, MD    Past Medical History: Past Medical History:  Diagnosis Date  . CHF (congestive heart failure)  (HCC)   . Chronic systolic heart failure (HCC)    a. Echo 12/05/11:  EF 20-25%, diff HK with mild sparing of IL wall, mild AI, mod MR, mild LAE, mild RVE, mild reduced RVF.;  b.  Echo 05/11/2012:  Mild LVH, EF 20-25%, Gr 1 diast dysfn, mod MR, mild LAE  . Depression   . G6PD deficiency (HCC)   . GERD (gastroesophageal reflux disease)   . Hepatitis    Hep B and Hep C (patient does not report this but these are listed in previous notes.)  . HIV infection (HCC)   . Hypertension   . NICM (nonischemic cardiomyopathy) (HCC)    cardia CTA 8/13 negative for  obstructive CAD    Past Surgical History: Past Surgical History:  Procedure Laterality Date  . BI-VENTRICULAR PACEMAKER INSERTION N/A 06/26/2013   Procedure: BI-VENTRICULAR PACEMAKER INSERTION (CRT-P);  Surgeon: Marinus Maw, MD;  Location: [facility] CATH LAB;  Service: Cardiovascular;  Laterality: N/A;  . COLONOSCOPY WITH PROPOFOL N/A 08/14/2016   Procedure: COLONOSCOPY WITH PROPOFOL;  Surgeon: Jeani Hawking, MD;  Location: [facility] ENDOSCOPY;  Service: Endoscopy;  Laterality: N/A;  . ELECTROPHYSIOLOGY STUDY N/A 02/19/2012   Procedure: ELECTROPHYSIOLOGY STUDY;  Surgeon: Marinus Maw, MD;  Location: [facility] CATH LAB;  Service: Cardiovascular;  Laterality: N/A;  . ESOPHAGOGASTRODUODENOSCOPY  12/07/2011   Procedure: ESOPHAGOGASTRODUODENOSCOPY (EGD);  Surgeon: [name], MD,FACG;  Location: [facility] ENDOSCOPY;  Service: Endoscopy;  Laterality: N/A;  . FINGER SURGERY     Thumb laceration.    Marland Kitchen LEFT HEART CATHETERIZATION WITH CORONARY ANGIOGRAM N/A 01/31/2014   Procedure: LEFT HEART CATHETERIZATION WITH CORONARY ANGIOGRAM;  Surgeon: Iran Ouch, MD;  Location: [facility] CATH LAB;  Service: Cardiovascular;  Laterality: N/A;    Family History: Family History  Problem Relation Age of Onset  . Lung cancer Mother   . Colon cancer Unknown 89    Social History: Social History   Social History  . Marital status: Single    Spouse name: N/A  . Number of children: N/A    . Years of education: N/A   Occupational History  . Unemployed    Social History Main Topics  . Smoking status: Never Smoker  . Smokeless tobacco: Never Used  . Alcohol use 0.6 oz/week    1 Cans of beer per week     Comment: occ beer  . Drug use: Yes    Frequency: 7.0 times per week    Types: Marijuana     Comment: marijuana last used jan 2018  . Sexual activity: Yes    Partners: Male     Comment: 35 year relationship, givencondoms   Other Topics Concern  . None   Social History Narrative   Lives alone.  Drinks beer daily.  Liquor rarely.  He occasionally smokes marijuana..    Allergies:  Allergies  Allergen Reactions  . Bactrim Rash    Objective:    Vital Signs:   Temp:  [98 F (36.7 C)-98.7 F (37.1 C)] 98.7 F (37.1 C) (08/28 0956) Pulse Rate:  [67-112] 68 (08/28 0956) Resp:  [12-19] 18 (08/28 0956) BP: (80-122)/(50-88) 122/88 (08/28 0956) SpO2:  [93 %-100 %] 99 % (08/28 0956) Weight:  [161 lb (73 kg)-162 lb (73.5 kg)] 162 lb (73.5 kg) (08/27 2245) Last BM Date: 01/25/17  Weight change: Filed Weights   01/25/17 1902 01/25/17 2245  Weight: 161 lb (73 kg) 162 lb (73.5 kg)    Intake/Output:   Intake/Output Summary (Last 24 hours) at 01/26/17 1025 Last data filed at 01/26/17 0957  Gross per 24 hour  Intake            997.5 ml  Output              150 ml  Net            847.5 ml      Physical Exam    General:  Fatiged appearing.  No resp difficulty HEENT: normal Neck: supple. JVP flat. Carotids 2+ bilat; no bruits. No lymphadenopathy or thyromegaly appreciated. Cor: PMI nondisplaced. Regular rate & rhythm. No rubs, gallops or murmurs. Lungs: clear Abdomen: soft, nontender, nondistended. No hepatosplenomegaly. No bruits or masses. Good bowel sounds.  Extremities: no cyanosis, clubbing, rash, edema Neuro: alert & orientedx3, cranial nerves grossly intact. moves all 4 extremities w/o difficulty. Affect pleasant  Telemetry   Not currently  connected  EKG    Personally reviewed, V paced  Labs   Basic Metabolic Panel:  Recent Labs Lab 01/25/17 1655 01/26/17 0613  NA 140 138  K 4.6 4.5  CL 108 110  CO2 20* 19*  GLUCOSE 103* 95  BUN 39* 36*  CREATININE 3.19* 2.36*  CALCIUM 9.3 8.9  MG  --  2.0  PHOS  --  5.0*    Liver Function Tests:  Recent Labs Lab 01/25/17 1655 01/26/17 0613  AST 93* 84*  ALT 68* 56  ALKPHOS 148* 109  BILITOT 1.9* 1.7*  PROT 8.0 6.6  ALBUMIN 3.4* 2.9*   No results for input(s): LIPASE, AMYLASE in the last 168 hours. No results for input(s): AMMONIA in the last 168 hours.  CBC:  Recent Labs Lab 01/25/17 1655 01/26/17 0613  WBC 9.5 8.7  NEUTROABS 5.5 4.5  HGB 13.7 12.2*  HCT 41.5 39.1  MCV 94.1 95.4  PLT 153 138*    Cardiac Enzymes:  Recent Labs Lab 01/25/17 2315 01/26/17 0613  TROPONINI <0.03 <0.03    BNP: BNP (last 3 results)  Recent Labs  12/18/16 1144  BNP 49.5    ProBNP (last 3 results) No results for input(s): PROBNP in the last 8760 hours.   CBG:  Recent Labs Lab 01/26/17 0613 01/26/17 0747  GLUCAP 100* 86    Coagulation Studies: No results for input(s): LABPROT, INR in the last 72 hours.   Imaging   Dg Chest 2 View  Result Date: 01/25/2017 CLINICAL DATA:  Fall.  Chest pain. EXAM: CHEST  2 VIEW COMPARISON:  01/27/2014.  02/12/2013.  CT 01/20/2013.  CT 01/20/2012 FINDINGS: AICD in stable position. Heart size normal. Stable left base pleuroparenchymal thickening consistent with scarring. Follow-up chest x-ray recommended to exclude an underlying focal lesion/mass. No pleural effusion or pneumothorax. No evidence of displaced rib fracture. IMPRESSION: 1. Stable left base pleuroparenchymal thickening consistent with scarring. No acute abnormality identified . 2. AICD in stable position . Electronically Signed   By: Maisie Fus  Register   On: 01/25/2017 16:33   Dg Ankle Complete Right  Result Date: 01/25/2017 CLINICAL DATA:  Continued acute  right ankle pain following fall 1 week ago. Initial encounter. EXAM: RIGHT ANKLE - COMPLETE 3+ VIEW COMPARISON:  None. FINDINGS: An oblique fracture of the distal fibula (4 mm lateral/ posterior displacement), a comminuted fracture the medial malleolus, and a nondisplaced fracture of the posterior tibial malleolus noted. A 1.5 cm bony fragment anterior to the joint on the lateral view is noted, likely displaced from the distal tibia. There is no evidence of subluxation or dislocation. IMPRESSION: Trimalleolar fractures as described. 1.5 cm bony fragment anterior to the ankle joint on the lateral view likely displaced from the distal tibia. Electronically Signed   By: Harmon Pier M.D.   On: 01/25/2017 16:31   Ct Knee Left Wo Contrast  Result Date: 01/26/2017 CLINICAL DATA:  Proximal tibia fracture secondary to a fall yesterday. EXAM: CT OF THE LEFT KNEE WITHOUT CONTRAST TECHNIQUE: Multidetector CT imaging of the left knee was performed according to the standard protocol. Multiplanar CT image reconstructions were also generated. COMPARISON:  Radiographs dated 01/25/2017 FINDINGS: Bones/Joint/Cartilage There is an avulsion fracture of the posterior aspect of the medial tibial plateau extending across the midline involving the tibial insertion of the posterior cruciate ligament.  There is minimal distraction of approximately 3 mm. There is no depression of fracture fragments. No other fractures.  Small joint effusion. Ligaments Suboptimally assessed by CT. ACL appears to be intact. Prominent soft tissue swelling adjacent to the MCL suggesting the possibility of a MCL sprain. LCL appears normal. Muscles and Tendons Negative. Soft tissues Negative. IMPRESSION: Large avulsion fracture involving the posterior aspect of the medial tibial plateau as well as the tibial attachment of the posterior cruciate ligament. Electronically Signed   By: Francene Boyers M.D.   On: 01/26/2017 10:19   Dg Knee Complete 4 Views  Left  Result Date: 01/25/2017 CLINICAL DATA:  Patient with diffuse knee pain status post fall. EXAM: LEFT KNEE - COMPLETE 4+ VIEW COMPARISON:  Left knee radiograph 11/23/2012. FINDINGS: Distal femur is unremarkable. There is a mildly distracted oblique fracture through the mid aspect of the proximal tibia extending to the articular surface near the tibial spines. Proximal fibula is unremarkable. Regional soft tissues are unremarkable. IMPRESSION: Fracture of the proximal tibia extending to the articular surface. Electronically Signed   By: Annia Belt M.D.   On: 01/25/2017 16:34   Dg Hand Complete Left  Result Date: 01/25/2017 CLINICAL DATA:  Left hand pain secondary to a fall one week ago. EXAM: LEFT HAND - COMPLETE 3+ VIEW COMPARISON:  None. FINDINGS: There is a slightly displaced the hand angulated fracture of the base of the first metacarpal. No dislocation.  The other bones appear normal. IMPRESSION: Fracture of the base of the proximal first metacarpal with slight angulation and displacement. Electronically Signed   By: Francene Boyers M.D.   On: 01/25/2017 16:40   Dg Finger Thumb Left  Result Date: 01/25/2017 CLINICAL DATA:  Acute left thumb pain following fall 1 week ago. Initial encounter. EXAM: LEFT THUMB 2+V COMPARISON:  None. FINDINGS: A fracture of the proximal first metacarpal is identified and does not appear to extend to the carpometacarpal joint. Mild apex dorsal angulation is noted. No subluxation or dislocation. IMPRESSION: Proximal first metacarpal fracture. Electronically Signed   By: Harmon Pier M.D.   On: 01/25/2017 16:33      Medications:     Current Medications: . [MAR Hold] amiodarone  200 mg Oral Daily  . [MAR Hold] Chlorhexidine Gluconate Cloth  6 each Topical Daily  . [MAR Hold] clobetasol ointment  1 application Topical BID  . [MAR Hold] dolutegravir  50 mg Oral Daily  . [MAR Hold] emtricitabine-rilpivir-tenofovir AF  1 tablet Oral Q breakfast  . [MAR Hold]  fentaNYL (SUBLIMAZE) injection  100 mcg Intravenous Once  . [MAR Hold] folic acid  1 mg Oral Daily  . [MAR Hold] LORazepam  1 mg Oral QHS  . [MAR Hold] multivitamin with minerals  1 tablet Oral Daily  . [MAR Hold] mupirocin ointment  1 application Nasal BID  . [MAR Hold] senna  1 tablet Oral BID  . [MAR Hold] sodium chloride flush  3 mL Intravenous Q12H  . [MAR Hold] thiamine  100 mg Oral Daily   Or  . [MAR Hold] thiamine  100 mg Intravenous Daily  . [MAR Hold] triamcinolone cream  1 application Topical TID     Infusions: . sodium chloride 75 mL/hr at 01/25/17 2340  . [MAR Hold] methocarbamol (ROBAXIN)  IV    . [MAR Hold] sodium chloride Stopped (01/25/17 2148)  . [MAR Hold] sodium chloride Stopped (01/25/17 2148)       Patient Profile   MAT STUARD is a 51 y.o. male  with a history of HIV 1998, HCV, ETOH abuse and CHF due to NICM s/p [employer]  CRT-D (06/2013). He also has a h/o syncope and previously had an EP study which was non-inducible for VT.  Admitted for close R ankle, L knee, and L wrist fracture in setting of syncope and collapse. ICD interrogation negative for VT.   Assessment/Plan   1. Syncope and collapse - Likely vasovagal with hypotension and AKI - HF meds on hold. Will have to adjust moving forward. Would likely use lasix PNR only and would favor at least decreasing Entresto dose with its powerful antihypertensive properties.  2. Chronic systolic CHF - Holding meds. Will adjust as stabilizes - Suspect can start back on low doses of coreg and spiro and AKI improves - Will have to reduce Entresto moving forward, and may have to consider backing down to Losartan.  3. Hepatitis C - LFTs stable. Per primary 4. HIV - Per primary 5. H/o VT - No VT noted on ICD interrogation. Continue amiodarone.    Length of Stay: 1  Luane School  01/26/2017, 10:25 AM  Advanced Heart Failure Team Pager [phone] (M-F; 7a - 4p)  Please contact [facility] for night-coverage after hours (4p -7a ) and weekends on [email or web address]  Patient seen and examined with the above-signed Advanced Practice Provider and/or Housestaff. I personally reviewed laboratory data, imaging studies and relevant notes. I independently examined the patient and formulated the important aspects of the plan. I have edited the note to reflect any of my changes or salient points. I have personally discussed the plan with the patient and/or family.  51 y/o male well known to [place] from HF Clinic. Previous severe systolic dysfunction felt possibly due to ETOH. Recent admit with hypotension and AKI due to overdiuresis. Now with fall and AKI. He is s/p ORIF of ankle fracture. He is stable post-op. Echo reviewed personally and shows complete recovery of LV function with EF 65%. Continue to hold Entresto and diuretics. Wil restart meds as tolerated.   [name], MD  11:27 PM

## 2017-01-26 NOTE — Consult Note (Signed)
ORTHOPAEDIC CONSULTATION  REQUESTING PHYSICIAN: Dhungel, Nishant, MD  Chief Complaint: inability to bear weight after syncopal episode resulting in fall. Left knee, right ankle, left thumb pain.  Assessment / Plan: Principal Problem:   Closed right ankle fracture Active Problems:   Human immunodeficiency virus (HIV) disease (HCC)   Chronic hepatitis C without hepatic coma (HCC)   Essential hypertension   Alcohol abuse   Cardiomyopathy ([place])   Chronic systolic heart failure (HCC)   PTSD (post-traumatic stress disorder)   Dehydration   Hypotension   Skin rash   Elevated liver enzymes   Closed fracture of base of first metacarpal of left hand   Closed fracture of left tibial plateau   Syncope   HIV (human immunodeficiency virus infection) (HCC)   ARF (acute renal failure) (HCC)   Closed fracture of left proximal tibia   Multiple fractures  Closed trimalleolar ankle fracture Plan for ORIF Nothing by mouth Nonweightbearing Elevate  The risks benefits and alternatives of the procedure were discussed with the patient  The patient verbalizes understanding and wishes to proceed.    Closed proximal first metacarpal fracture nonoperative management Maintain thumb spica Nonweightbearing-WB okay through elbow  Closed left tibial plateau fracture Plan for nonoperative management. Nonweightbearing Knee immobilizer Elevate and apply ice  Weight Bearing Status:  NWB B LE NWB LUE (okay to bear weight through elbow)   HPI: [name] is a 51 y.o. male who complains of  Inability to bear weight after a fall at home. He was unable to mobilize and negotiated his home with the assistance from his husband. He denies lesions in the areas of injury. He knew his ankle was broken due to intermittent dislocation. He denies numbness in his upper or lower extremities. He denies fever or chills. He denies chest pain or shortness of breath.  Past Medical History:  Diagnosis Date   . CHF (congestive heart failure) ([place])   . Chronic systolic heart failure ([place])    a. Echo 12/05/11:  EF 20-25%, diff HK with mild sparing of IL wall, mild AI, mod MR, mild LAE, mild RVE, mild reduced RVF.;  b.  Echo 05/11/2012:  Mild LVH, EF 20-25%, Gr 1 diast dysfn, mod MR, mild LAE  . Depression   . G6PD deficiency ([place])   . GERD (gastroesophageal reflux disease)   . Hepatitis    Hep B and Hep C (patient does not report this but these are listed in previous notes.)  . HIV infection ([place])   . Hypertension   . NICM (nonischemic cardiomyopathy) ([place])    cardia CTA 8/13 negative for obstructive CAD   Past Surgical History:  Procedure Laterality Date  . BI-VENTRICULAR PACEMAKER INSERTION N/A 06/26/2013   Procedure: BI-VENTRICULAR PACEMAKER INSERTION (CRT-P);  Surgeon: Evans Lance, MD;  Location: [facility] CATH LAB;  Service: Cardiovascular;  Laterality: N/A;  . COLONOSCOPY WITH PROPOFOL N/A 08/14/2016   Procedure: COLONOSCOPY WITH PROPOFOL;  Surgeon: Carol Ada, MD;  Location: [facility] ENDOSCOPY;  Service: Endoscopy;  Laterality: N/A;  . ELECTROPHYSIOLOGY STUDY N/A 02/19/2012   Procedure: ELECTROPHYSIOLOGY STUDY;  Surgeon: Evans Lance, MD;  Location: [facility] CATH LAB;  Service: Cardiovascular;  Laterality: N/A;  . ESOPHAGOGASTRODUODENOSCOPY  12/07/2011   Procedure: ESOPHAGOGASTRODUODENOSCOPY (EGD);  Surgeon: Ladene Artist, MD,FACG;  Location: [facility] ENDOSCOPY;  Service: Endoscopy;  Laterality: N/A;  . FINGER SURGERY     Thumb laceration.    Marland Kitchen LEFT HEART CATHETERIZATION WITH CORONARY ANGIOGRAM N/A 01/31/2014   Procedure: LEFT  HEART CATHETERIZATION WITH CORONARY ANGIOGRAM;  Surgeon: Wellington Hampshire, MD;  Location: [facility] CATH LAB;  Service: Cardiovascular;  Laterality: N/A;   Social History   Social History  . Marital status: Single    Spouse name: N/A  . Number of children: N/A  . Years of education: N/A   Occupational History  . Unemployed    Social History Main Topics  . Smoking status: Never Smoker   . Smokeless tobacco: Never Used  . Alcohol use 0.6 oz/week    1 Cans of beer per week     Comment: occ beer  . Drug use: Yes    Frequency: 7.0 times per week    Types: Marijuana     Comment: marijuana last used jan 2018  . Sexual activity: Yes    Partners: Male     Comment: 35 year relationship, givencondoms   Other Topics Concern  . None   Social History Narrative   Lives alone.  Drinks beer daily.  Liquor rarely.  He occasionally smokes marijuana..   Family History  Problem Relation Age of Onset  . Lung cancer Mother   . Colon cancer Unknown 50   Allergies  Allergen Reactions  . Bactrim Rash   Prior to Admission medications   Medication Sig Start Date End Date Taking? Authorizing Provider  amiodarone (PACERONE) 200 MG tablet Take 1 tablet (200 mg total) by mouth daily. 04/15/16  Yes Evans Lance, MD  carvedilol (COREG) 25 MG tablet Take 0.5 tablets (12.5 mg total) by mouth 2 (two) times daily with a meal. 12/29/16 02/12/17 Yes Larey Dresser, MD  clobetasol ointment (TEMOVATE) 2.63 % Apply 1 application topically 2 (two) times daily. 12/28/16  Yes [provider]  colchicine 0.6 MG tablet Take 1 tablet (0.6 mg total) by mouth 2 (two) times daily as needed. For gout flares. 12/25/14  Yes ComerOkey Regal, MD  emtricitabine-rilpivir-tenofovir AF (ODEFSEY) 200-25-25 MG TABS tablet Take 1 tablet by mouth daily with breakfast. 09/11/16  Yes Comer, Okey Regal, MD  ENTRESTO 97-103 MG Take 1 tablet by mouth 2 (two) times daily. 12/07/16  Yes [provider]  famotidine (PEPCID) 20 MG tablet Take 1 tablet (20 mg total) by mouth 2 (two) times daily. Patient taking differently: Take 20 mg by mouth as needed.  12/24/16  Yes Palumbo, April, MD  furosemide (LASIX) 40 MG tablet Take 1 tablet (40 mg total) by mouth as needed for fluid or edema. Patient taking differently: Take 40 mg by mouth 2 (two) times daily.  12/29/16 03/29/17 Yes Larey Dresser, MD  hydrOXYzine  (ATARAX/VISTARIL) 50 MG tablet take 1 tablet by mouth three times a day if needed Patient taking differently: Take 50 mg by mouth 3 (three) times daily as needed for itching. take 1 tablet by mouth three times a day if needed 11/04/16  Yes Upstill, Shari, PA-C  ibuprofen (ADVIL,MOTRIN) 200 MG tablet Take 800 mg by mouth every 6 (six) hours as needed (pain).   Yes [provider]  LORazepam (ATIVAN) 1 MG tablet Take 1 tablet (1 mg total) by mouth at bedtime. 11/04/16  Yes Upstill, Nehemiah Settle, PA-C  mirtazapine (REMERON) 7.5 MG tablet Take 1 tablet (7.5 mg total) by mouth at bedtime. 07/02/16  Yes Comer, Okey Regal, MD  potassium chloride SA (K-DUR,KLOR-CON) 20 MEQ tablet Take 1 tablet by mouth twice daily when you take Lasix. Patient taking differently: Take 10 mEq by mouth 2 (two) times daily. when you take Lasix. 10/20/16  Yes Bensimhon, Shaune Pascal, MD  spironolactone (ALDACTONE) 25 MG tablet Take 1 tablet (25 mg total) by mouth daily. Patient taking differently: Take 12.5 mg by mouth daily.  12/29/16 03/29/17 Yes Larey Dresser, MD  TIVICAY 50 MG tablet take 1 tablet by mouth once daily WITH LUNCH 09/11/16  Yes Comer, Okey Regal, MD  triamcinolone cream (KENALOG) 0.1 % Apply 1 application topically 3 (three) times daily. 01/14/17  Yes [provider]  valACYclovir (VALTREX) 1000 MG tablet Take 1 tablet (1,000 mg total) by mouth 2 (two) times daily as needed (outbreaks). Take for 5 days for an outbreak 01/19/17  Yes Comer, Okey Regal, MD  sacubitril-valsartan (ENTRESTO) 49-51 MG Take 1 tablet by mouth 2 (two) times daily. Patient not taking: Reported on 01/25/2017 12/29/16   Larey Dresser, MD   Dg Chest 2 View  Result Date: 01/25/2017 CLINICAL DATA:  Fall.  Chest pain. EXAM: CHEST  2 VIEW COMPARISON:  01/27/2014.  02/12/2013.  CT 01/20/2013.  CT 01/20/2012 FINDINGS: AICD in stable position. Heart size normal. Stable left base pleuroparenchymal thickening consistent with scarring. Follow-up chest x-ray  recommended to exclude an underlying focal lesion/mass. No pleural effusion or pneumothorax. No evidence of displaced rib fracture. IMPRESSION: 1. Stable left base pleuroparenchymal thickening consistent with scarring. No acute abnormality identified . 2. AICD in stable position . Electronically Signed   By: Marcello Moores  Register   On: 01/25/2017 16:33   Dg Ankle Complete Right  Result Date: 01/25/2017 CLINICAL DATA:  Continued acute right ankle pain following fall 1 week ago. Initial encounter. EXAM: RIGHT ANKLE - COMPLETE 3+ VIEW COMPARISON:  None. FINDINGS: An oblique fracture of the distal fibula (4 mm lateral/ posterior displacement), a comminuted fracture the medial malleolus, and a nondisplaced fracture of the posterior tibial malleolus noted. A 1.5 cm bony fragment anterior to the joint on the lateral view is noted, likely displaced from the distal tibia. There is no evidence of subluxation or dislocation. IMPRESSION: Trimalleolar fractures as described. 1.5 cm bony fragment anterior to the ankle joint on the lateral view likely displaced from the distal tibia. Electronically Signed   By: Margarette Canada M.D.   On: 01/25/2017 16:31   Dg Knee Complete 4 Views Left  Result Date: 01/25/2017 CLINICAL DATA:  Patient with diffuse knee pain status post fall. EXAM: LEFT KNEE - COMPLETE 4+ VIEW COMPARISON:  Left knee radiograph 11/23/2012. FINDINGS: Distal femur is unremarkable. There is a mildly distracted oblique fracture through the mid aspect of the proximal tibia extending to the articular surface near the tibial spines. Proximal fibula is unremarkable. Regional soft tissues are unremarkable. IMPRESSION: Fracture of the proximal tibia extending to the articular surface. Electronically Signed   By: Lovey Newcomer M.D.   On: 01/25/2017 16:34   Dg Hand Complete Left  Result Date: 01/25/2017 CLINICAL DATA:  Left hand pain secondary to a fall one week ago. EXAM: LEFT HAND - COMPLETE 3+ VIEW COMPARISON:  None.  FINDINGS: There is a slightly displaced the hand angulated fracture of the base of the first metacarpal. No dislocation.  The other bones appear normal. IMPRESSION: Fracture of the base of the proximal first metacarpal with slight angulation and displacement. Electronically Signed   By: Lorriane Shire M.D.   On: 01/25/2017 16:40   Dg Finger Thumb Left  Result Date: 01/25/2017 CLINICAL DATA:  Acute left thumb pain following fall 1 week ago. Initial encounter. EXAM: LEFT THUMB 2+V COMPARISON:  None. FINDINGS: A fracture of the proximal  first metacarpal is identified and does not appear to extend to the carpometacarpal joint. Mild apex dorsal angulation is noted. No subluxation or dislocation. IMPRESSION: Proximal first metacarpal fracture. Electronically Signed   By: Margarette Canada M.D.   On: 01/25/2017 16:33    Positive ROS: All other systems have been reviewed and were otherwise negative with the exception of those mentioned in the HPI and as above.  Objective: Labs cbc  Recent Labs  01/25/17 1655  WBC 9.5  HGB 13.7  HCT 41.5  PLT 153    Labs inflam No results for input(s): CRP in the last 72 hours.  Invalid input(s): ESR  Labs coag No results for input(s): INR, PTT in the last 72 hours.  Invalid input(s): PT   Recent Labs  01/25/17 1655  NA 140  K 4.6  CL 108  CO2 20*  GLUCOSE 103*  BUN 39*  CREATININE 3.19*  CALCIUM 9.3    Physical Exam: Vitals:   01/25/17 2245 01/26/17 0404  BP: 105/68 112/85  Pulse: 79 67  Resp: 16 16  Temp: 98.1 F (36.7 C) 98 F (36.7 C)  SpO2: 99% 96%   General: Alert, no acute distress. Calm, conversant. Mental status: Alert and Oriented x3 Neurologic: Speech Clear and organized, no gross focal findings or movement disorder appreciated. Respiratory: No cyanosis, no use of accessory musculature Cardiovascular: No pedal edema GI: Abdomen is soft and non-tender, non-distended. Skin: Warm and dry.   Extremities: Warm w/o  edema Psychiatric: Patient is competent for consent with normal mood and affect  MUSCULOSKELETAL:  Left upper extremity with thumb spica splint in place and in good condition. Sensation intact. Left lower extremity in knee immobilizer. Knee without ecchymosis or effusion. Tender to palpation laterally. Right lower extremity: ankle with posterior/lateral splint in place good condition. EHL, FHL, sensation intact.   Other extremities are atraumatic with painless ROM and NVI.   Charna Elizabeth Slick III PA-C 01/26/2017 6:56 AMtt

## 2017-01-26 NOTE — Discharge Instructions (Signed)
Elevate leg - Toes above nose as much as possible to reduce pain / swelling.  You may loosen and re-apply ace wrap if it feels too tight.  Weight Bearing:  Non weight bearing both legs.  Do not bear weight with left hand.  Diet: As you were doing prior to hospitalization   Shower:  You have a splint on, leave the splint in place and keep the splint dry with a plastic bag.  Dressing:  You have splints. Leave in place and we will change your bandages during your first follow-up appointment.    Activity:  Increase activity slowly as tolerated, but follow the weight bearing instructions below.  The rules on driving is that you can not be taking narcotics while you drive, and you must feel in control of the vehicle.    To prevent constipation:  Narcotic medicines cause constipation.  Wean these as soon as is appropriate.   You may use a stool softener such as -  Colace (over the counter) 100 mg by mouth twice a day  Drink plenty of fluids (prune juice may be helpful) and high fiber foods Miralax (over the counter) for constipation as needed.    Itching:  If you experience itching with your medications, try taking only a single pain pill, or even half a pain pill at a time.  You may take up to 10 pain pills per day, and you can also use benadryl over the counter for itching or also to help with sleep.   Precautions:  If you experience chest pain or shortness of breath - call 911 immediately for transfer to the hospital emergency department!!  If you develop a fever greater that 101 F, purulent drainage from wound, increased redness or drainage from wound, or calf pain -- Call the office at [phone]                                                 Follow- Up Appointment:  Please call for an appointment to be seen in 2 weeks [place] - [phone]

## 2017-01-26 NOTE — Anesthesia Preprocedure Evaluation (Signed)
Anesthesia Evaluation  Patient identified by MRN, date of birth, ID band Patient awake    Reviewed: Allergy & Precautions, NPO status , Patient's Chart, lab work & pertinent test results  Airway Mallampati: I  TM Distance: >3 FB Neck ROM: Full    Dental   Pulmonary    Pulmonary exam normal        Cardiovascular hypertension, Pt. on medications +CHF  Normal cardiovascular exam+ Cardiac Defibrillator      Neuro/Psych Depression    GI/Hepatic GERD  Medicated and Controlled,(+) Hepatitis -, C  Endo/Other    Renal/GU Renal disease     Musculoskeletal   Abdominal   Peds  Hematology  (+) HIV,   Anesthesia Other Findings   Reproductive/Obstetrics                             Anesthesia Physical Anesthesia Plan  ASA: III  Anesthesia Plan: General   Post-op Pain Management:    Induction: Intravenous  PONV Risk Score and Plan: 2 and Ondansetron, Dexamethasone and Treatment may vary due to age or medical condition  Airway Management Planned: LMA  Additional Equipment:   Intra-op Plan: Utilization of Controlled Hypotension per surrgeon request  Post-operative Plan: Extubation in OR  Informed Consent: I have reviewed the patients History and Physical, chart, labs and discussed the procedure including the risks, benefits and alternatives for the proposed anesthesia with the patient or authorized representative who has indicated his/her understanding and acceptance.     Plan Discussed with: CRNA and Surgeon  Anesthesia Plan Comments:         Anesthesia Quick Evaluation

## 2017-01-26 NOTE — Progress Notes (Signed)
Orthopedic Tech Progress Note Patient Details:  [name] [birth year] [id number]  Ortho Devices Type of Ortho Device: Ace wrap, Post (short) splint, Post splint, Short leg splint, Post (short leg) splint, Knee Immobilizer, Thumb spica splint, Short arm splint Splint Material: Plaster Ortho Device/Splint Location: ohf on bed Ortho Device/Splint Interventions: Ordered, Application   Jennye Moccasin 01/26/2017, 10:41 PM

## 2017-01-26 NOTE — H&P (View-Only) (Signed)
ORTHOPAEDIC CONSULTATION  REQUESTING PHYSICIAN: Dhungel, Nishant, MD  Chief Complaint: inability to bear weight after syncopal episode resulting in fall. Left knee, right ankle, left thumb pain.  Assessment / Plan: Principal Problem:   Closed right ankle fracture Active Problems:   Human immunodeficiency virus (HIV) disease (HCC)   Chronic hepatitis C without hepatic coma (HCC)   Essential hypertension   Alcohol abuse   Cardiomyopathy ([place])   Chronic systolic heart failure (HCC)   PTSD (post-traumatic stress disorder)   Dehydration   Hypotension   Skin rash   Elevated liver enzymes   Closed fracture of base of first metacarpal of left hand   Closed fracture of left tibial plateau   Syncope   HIV (human immunodeficiency virus infection) (HCC)   ARF (acute renal failure) (HCC)   Closed fracture of left proximal tibia   Multiple fractures  Closed trimalleolar ankle fracture Plan for ORIF Nothing by mouth Nonweightbearing Elevate  The risks benefits and alternatives of the procedure were discussed with the patient  The patient verbalizes understanding and wishes to proceed.    Closed proximal first metacarpal fracture nonoperative management Maintain thumb spica Nonweightbearing-WB okay through elbow  Closed left tibial plateau fracture Plan for nonoperative management. Nonweightbearing Knee immobilizer Elevate and apply ice  Weight Bearing Status:  NWB B LE NWB LUE (okay to bear weight through elbow)   HPI: [name] is a 51 y.o. male who complains of  Inability to bear weight after a fall at home. He was unable to mobilize and negotiated his home with the assistance from his husband. He denies lesions in the areas of injury. He knew his ankle was broken due to intermittent dislocation. He denies numbness in his upper or lower extremities. He denies fever or chills. He denies chest pain or shortness of breath.  Past Medical History:  Diagnosis Date   . CHF (congestive heart failure) ([place])   . Chronic systolic heart failure ([place])    a. Echo 12/05/11:  EF 20-25%, diff HK with mild sparing of IL wall, mild AI, mod MR, mild LAE, mild RVE, mild reduced RVF.;  b.  Echo 05/11/2012:  Mild LVH, EF 20-25%, Gr 1 diast dysfn, mod MR, mild LAE  . Depression   . G6PD deficiency ([place])   . GERD (gastroesophageal reflux disease)   . Hepatitis    Hep B and Hep C (patient does not report this but these are listed in previous notes.)  . HIV infection ([place])   . Hypertension   . NICM (nonischemic cardiomyopathy) ([place])    cardia CTA 8/13 negative for obstructive CAD   Past Surgical History:  Procedure Laterality Date  . BI-VENTRICULAR PACEMAKER INSERTION N/A 06/26/2013   Procedure: BI-VENTRICULAR PACEMAKER INSERTION (CRT-P);  Surgeon: Evans Lance, MD;  Location: [facility] CATH LAB;  Service: Cardiovascular;  Laterality: N/A;  . COLONOSCOPY WITH PROPOFOL N/A 08/14/2016   Procedure: COLONOSCOPY WITH PROPOFOL;  Surgeon: Carol Ada, MD;  Location: [facility] ENDOSCOPY;  Service: Endoscopy;  Laterality: N/A;  . ELECTROPHYSIOLOGY STUDY N/A 02/19/2012   Procedure: ELECTROPHYSIOLOGY STUDY;  Surgeon: Evans Lance, MD;  Location: [facility] CATH LAB;  Service: Cardiovascular;  Laterality: N/A;  . ESOPHAGOGASTRODUODENOSCOPY  12/07/2011   Procedure: ESOPHAGOGASTRODUODENOSCOPY (EGD);  Surgeon: Ladene Artist, MD,FACG;  Location: [facility] ENDOSCOPY;  Service: Endoscopy;  Laterality: N/A;  . FINGER SURGERY     Thumb laceration.    Marland Kitchen LEFT HEART CATHETERIZATION WITH CORONARY ANGIOGRAM N/A 01/31/2014   Procedure: LEFT  HEART CATHETERIZATION WITH CORONARY ANGIOGRAM;  Surgeon: Wellington Hampshire, MD;  Location: [facility] CATH LAB;  Service: Cardiovascular;  Laterality: N/A;   Social History   Social History  . Marital status: Single    Spouse name: N/A  . Number of children: N/A  . Years of education: N/A   Occupational History  . Unemployed    Social History Main Topics  . Smoking status: Never Smoker   . Smokeless tobacco: Never Used  . Alcohol use 0.6 oz/week    1 Cans of beer per week     Comment: occ beer  . Drug use: Yes    Frequency: 7.0 times per week    Types: Marijuana     Comment: marijuana last used jan 2018  . Sexual activity: Yes    Partners: Male     Comment: 35 year relationship, givencondoms   Other Topics Concern  . None   Social History Narrative   Lives alone.  Drinks beer daily.  Liquor rarely.  He occasionally smokes marijuana..   Family History  Problem Relation Age of Onset  . Lung cancer Mother   . Colon cancer Unknown 50   Allergies  Allergen Reactions  . Bactrim Rash   Prior to Admission medications   Medication Sig Start Date End Date Taking? Authorizing Provider  amiodarone (PACERONE) 200 MG tablet Take 1 tablet (200 mg total) by mouth daily. 04/15/16  Yes Evans Lance, MD  carvedilol (COREG) 25 MG tablet Take 0.5 tablets (12.5 mg total) by mouth 2 (two) times daily with a meal. 12/29/16 02/12/17 Yes Larey Dresser, MD  clobetasol ointment (TEMOVATE) 2.63 % Apply 1 application topically 2 (two) times daily. 12/28/16  Yes [provider]  colchicine 0.6 MG tablet Take 1 tablet (0.6 mg total) by mouth 2 (two) times daily as needed. For gout flares. 12/25/14  Yes ComerOkey Regal, MD  emtricitabine-rilpivir-tenofovir AF (ODEFSEY) 200-25-25 MG TABS tablet Take 1 tablet by mouth daily with breakfast. 09/11/16  Yes Comer, Okey Regal, MD  ENTRESTO 97-103 MG Take 1 tablet by mouth 2 (two) times daily. 12/07/16  Yes [provider]  famotidine (PEPCID) 20 MG tablet Take 1 tablet (20 mg total) by mouth 2 (two) times daily. Patient taking differently: Take 20 mg by mouth as needed.  12/24/16  Yes Palumbo, April, MD  furosemide (LASIX) 40 MG tablet Take 1 tablet (40 mg total) by mouth as needed for fluid or edema. Patient taking differently: Take 40 mg by mouth 2 (two) times daily.  12/29/16 03/29/17 Yes Larey Dresser, MD  hydrOXYzine  (ATARAX/VISTARIL) 50 MG tablet take 1 tablet by mouth three times a day if needed Patient taking differently: Take 50 mg by mouth 3 (three) times daily as needed for itching. take 1 tablet by mouth three times a day if needed 11/04/16  Yes Upstill, Shari, PA-C  ibuprofen (ADVIL,MOTRIN) 200 MG tablet Take 800 mg by mouth every 6 (six) hours as needed (pain).   Yes [provider]  LORazepam (ATIVAN) 1 MG tablet Take 1 tablet (1 mg total) by mouth at bedtime. 11/04/16  Yes Upstill, Nehemiah Settle, PA-C  mirtazapine (REMERON) 7.5 MG tablet Take 1 tablet (7.5 mg total) by mouth at bedtime. 07/02/16  Yes Comer, Okey Regal, MD  potassium chloride SA (K-DUR,KLOR-CON) 20 MEQ tablet Take 1 tablet by mouth twice daily when you take Lasix. Patient taking differently: Take 10 mEq by mouth 2 (two) times daily. when you take Lasix. 10/20/16  Yes Bensimhon, Shaune Pascal, MD  spironolactone (ALDACTONE) 25 MG tablet Take 1 tablet (25 mg total) by mouth daily. Patient taking differently: Take 12.5 mg by mouth daily.  12/29/16 03/29/17 Yes Larey Dresser, MD  TIVICAY 50 MG tablet take 1 tablet by mouth once daily WITH LUNCH 09/11/16  Yes Comer, Okey Regal, MD  triamcinolone cream (KENALOG) 0.1 % Apply 1 application topically 3 (three) times daily. 01/14/17  Yes [provider]  valACYclovir (VALTREX) 1000 MG tablet Take 1 tablet (1,000 mg total) by mouth 2 (two) times daily as needed (outbreaks). Take for 5 days for an outbreak 01/19/17  Yes Comer, Okey Regal, MD  sacubitril-valsartan (ENTRESTO) 49-51 MG Take 1 tablet by mouth 2 (two) times daily. Patient not taking: Reported on 01/25/2017 12/29/16   Larey Dresser, MD   Dg Chest 2 View  Result Date: 01/25/2017 CLINICAL DATA:  Fall.  Chest pain. EXAM: CHEST  2 VIEW COMPARISON:  01/27/2014.  02/12/2013.  CT 01/20/2013.  CT 01/20/2012 FINDINGS: AICD in stable position. Heart size normal. Stable left base pleuroparenchymal thickening consistent with scarring. Follow-up chest x-ray  recommended to exclude an underlying focal lesion/mass. No pleural effusion or pneumothorax. No evidence of displaced rib fracture. IMPRESSION: 1. Stable left base pleuroparenchymal thickening consistent with scarring. No acute abnormality identified . 2. AICD in stable position . Electronically Signed   By: Marcello Moores  Register   On: 01/25/2017 16:33   Dg Ankle Complete Right  Result Date: 01/25/2017 CLINICAL DATA:  Continued acute right ankle pain following fall 1 week ago. Initial encounter. EXAM: RIGHT ANKLE - COMPLETE 3+ VIEW COMPARISON:  None. FINDINGS: An oblique fracture of the distal fibula (4 mm lateral/ posterior displacement), a comminuted fracture the medial malleolus, and a nondisplaced fracture of the posterior tibial malleolus noted. A 1.5 cm bony fragment anterior to the joint on the lateral view is noted, likely displaced from the distal tibia. There is no evidence of subluxation or dislocation. IMPRESSION: Trimalleolar fractures as described. 1.5 cm bony fragment anterior to the ankle joint on the lateral view likely displaced from the distal tibia. Electronically Signed   By: Margarette Canada M.D.   On: 01/25/2017 16:31   Dg Knee Complete 4 Views Left  Result Date: 01/25/2017 CLINICAL DATA:  Patient with diffuse knee pain status post fall. EXAM: LEFT KNEE - COMPLETE 4+ VIEW COMPARISON:  Left knee radiograph 11/23/2012. FINDINGS: Distal femur is unremarkable. There is a mildly distracted oblique fracture through the mid aspect of the proximal tibia extending to the articular surface near the tibial spines. Proximal fibula is unremarkable. Regional soft tissues are unremarkable. IMPRESSION: Fracture of the proximal tibia extending to the articular surface. Electronically Signed   By: Lovey Newcomer M.D.   On: 01/25/2017 16:34   Dg Hand Complete Left  Result Date: 01/25/2017 CLINICAL DATA:  Left hand pain secondary to a fall one week ago. EXAM: LEFT HAND - COMPLETE 3+ VIEW COMPARISON:  None.  FINDINGS: There is a slightly displaced the hand angulated fracture of the base of the first metacarpal. No dislocation.  The other bones appear normal. IMPRESSION: Fracture of the base of the proximal first metacarpal with slight angulation and displacement. Electronically Signed   By: Lorriane Shire M.D.   On: 01/25/2017 16:40   Dg Finger Thumb Left  Result Date: 01/25/2017 CLINICAL DATA:  Acute left thumb pain following fall 1 week ago. Initial encounter. EXAM: LEFT THUMB 2+V COMPARISON:  None. FINDINGS: A fracture of the proximal  first metacarpal is identified and does not appear to extend to the carpometacarpal joint. Mild apex dorsal angulation is noted. No subluxation or dislocation. IMPRESSION: Proximal first metacarpal fracture. Electronically Signed   By: Margarette Canada M.D.   On: 01/25/2017 16:33    Positive ROS: All other systems have been reviewed and were otherwise negative with the exception of those mentioned in the HPI and as above.  Objective: Labs cbc  Recent Labs  01/25/17 1655  WBC 9.5  HGB 13.7  HCT 41.5  PLT 153    Labs inflam No results for input(s): CRP in the last 72 hours.  Invalid input(s): ESR  Labs coag No results for input(s): INR, PTT in the last 72 hours.  Invalid input(s): PT   Recent Labs  01/25/17 1655  NA 140  K 4.6  CL 108  CO2 20*  GLUCOSE 103*  BUN 39*  CREATININE 3.19*  CALCIUM 9.3    Physical Exam: Vitals:   01/25/17 2245 01/26/17 0404  BP: 105/68 112/85  Pulse: 79 67  Resp: 16 16  Temp: 98.1 F (36.7 C) 98 F (36.7 C)  SpO2: 99% 96%   General: Alert, no acute distress. Calm, conversant. Mental status: Alert and Oriented x3 Neurologic: Speech Clear and organized, no gross focal findings or movement disorder appreciated. Respiratory: No cyanosis, no use of accessory musculature Cardiovascular: No pedal edema GI: Abdomen is soft and non-tender, non-distended. Skin: Warm and dry.   Extremities: Warm w/o  edema Psychiatric: Patient is competent for consent with normal mood and affect  MUSCULOSKELETAL:  Left upper extremity with thumb spica splint in place and in good condition. Sensation intact. Left lower extremity in knee immobilizer. Knee without ecchymosis or effusion. Tender to palpation laterally. Right lower extremity: ankle with posterior/lateral splint in place good condition. EHL, FHL, sensation intact.   Other extremities are atraumatic with painless ROM and NVI.   Charna Elizabeth Slick III PA-C 01/26/2017 6:56 AMtt

## 2017-01-26 NOTE — Anesthesia Postprocedure Evaluation (Signed)
Anesthesia Post Note  Patient: [name]  Procedure(s) Performed: Procedure(s) (LRB): OPEN REDUCTION INTERNAL FIXATION (ORIF) ANKLE FRACTURE (Right)     Patient location during evaluation: PACU Anesthesia Type: General Level of consciousness: awake and alert Pain management: pain level controlled Vital Signs Assessment: post-procedure vital signs reviewed and stable Respiratory status: spontaneous breathing, nonlabored ventilation, respiratory function stable and patient connected to nasal cannula oxygen Cardiovascular status: blood pressure returned to baseline and stable Postop Assessment: no signs of nausea or vomiting Anesthetic complications: no    Last Vitals:  Vitals:   01/26/17 1445 01/26/17 1500  BP: 119/89   Pulse: 68 67  Resp: 16 14  Temp: (!) 36.3 C   SpO2: 96% 95%    Last Pain:  Vitals:   01/26/17 1445  TempSrc:   PainSc: 0-No pain                 [name] DAVID

## 2017-01-26 NOTE — Progress Notes (Signed)
  Echocardiogram 2D Echocardiogram has been performed.  Arvil Chaco 01/26/2017, 4:03 PM

## 2017-01-26 NOTE — Op Note (Signed)
01/25/2017 - 01/26/2017  1:12 PM  PATIENT:  Manus Rudd    PRE-OPERATIVE DIAGNOSIS:  fracture  POST-OPERATIVE DIAGNOSIS:  Same  PROCEDURE:  OPEN REDUCTION INTERNAL FIXATION (ORIF) ANKLE FRACTURE  SURGEON:  Leilanni Halvorson, Jewel Baize, MD  ASSISTANT: Aquilla Hacker, PA-C, he was present and scrubbed throughout the case, critical for completion in a timely fashion, and for retraction, instrumentation, and closure.   ANESTHESIA:   gen  PREOPERATIVE INDICATIONS:  GENEVA LEAVELLE is a  51 y.o. male with a diagnosis of fracture who failed conservative measures and elected for surgical management.    The risks benefits and alternatives were discussed with the patient preoperatively including but not limited to the risks of infection, bleeding, nerve injury, cardiopulmonary complications, the need for revision surgery, among others, and the patient was willing to proceed.  OPERATIVE IMPLANTS: stryker stainless plate and can screws  OPERATIVE FINDINGS: Unstable ankle fracture. Stable syndesmosis post op  BLOOD LOSS: min  COMPLICATIONS: none  TOURNIQUET TIME:  OPERATIVE PROCEDURE:  Patient was identified in the preoperative holding area and site was marked by me He was transported to the operating theater and placed on the table in supine position taking care to pad all bony prominences. After a preincinduction time out anesthesia was induced. The right lower extremity was prepped and draped in normal sterile fashion and a pre-incision timeout was performed. Manus Rudd received ancef for preoperative antibiotics.   I made a lateral incision of roughly 7 cm dissection was carried down sharply to the distal fibula and then spreading dissection was used proximally to protect the superficial peroneal nerve. I sharply incised the periosteum and took care to protect the peroneal tendons. I then debrided the fracture site and performed a reduction maneuver which was held in place with a  clamp.   I then selected a 6-hole one third tubular plate and placed in a neutralization fashion care was taken distally so as not to penetrate the joint with the cancellus screws.  I then turned my attention medially where I created a 4 cm incision and dissected sharply down to the medial Mal fracture taking care to protect the saphenous vein. I debrided the fracture and reduced and held in place with a tenaculum. I then drilled and placed 2 partially threaded 45 mm cannulated screws one anterior and one posterior across the fracture.  I then stressed the syndesmosis and it was stable  I assesed the posterior mal piece and it was small enough to not require fixation as it involved less than 20% of the articular surface  The wound was then thoroughly irrigated and closed using a 0 Vicryl and absorbable Monocryl sutures. He was placed in a short leg splint.   POST OPERATIVE PLAN: Non-weightbearing. DVT prophylaxis will consist of mobilization and chemical px

## 2017-01-27 ENCOUNTER — Inpatient Hospital Stay (HOSPITAL_COMMUNITY): Payer: Medicare Other

## 2017-01-27 ENCOUNTER — Ambulatory Visit (HOSPITAL_COMMUNITY): Payer: Medicare Other

## 2017-01-27 DIAGNOSIS — R55 Syncope and collapse: Secondary | ICD-10-CM

## 2017-01-27 DIAGNOSIS — N170 Acute kidney failure with tubular necrosis: Secondary | ICD-10-CM

## 2017-01-27 LAB — VAS US CAROTID
LEFT ECA DIAS: -25 cm/s
LEFT VERTEBRAL DIAS: 13 cm/s
LICADDIAS: -24 cm/s
LICAPDIAS: -17 cm/s
LICAPSYS: -40 cm/s
Left CCA dist dias: -27 cm/s
Left CCA dist sys: -78 cm/s
Left CCA prox dias: 27 cm/s
Left CCA prox sys: 93 cm/s
Left ICA dist sys: -52 cm/s
RCCAPDIAS: 27 cm/s
RIGHT ECA DIAS: -22 cm/s
RIGHT VERTEBRAL DIAS: 14 cm/s
Right CCA prox sys: 92 cm/s
Right cca dist sys: 54 cm/s

## 2017-01-27 LAB — BASIC METABOLIC PANEL
Anion gap: 9 (ref 5–15)
BUN: 16 mg/dL (ref 6–20)
CHLORIDE: 110 mmol/L (ref 101–111)
CO2: 20 mmol/L — ABNORMAL LOW (ref 22–32)
Calcium: 8.8 mg/dL — ABNORMAL LOW (ref 8.9–10.3)
Creatinine, Ser: 1.31 mg/dL — ABNORMAL HIGH (ref 0.61–1.24)
GFR calc Af Amer: >60 mL/min (ref >60)
GLUCOSE: 92 mg/dL (ref 65–99)
POTASSIUM: 4.6 mmol/L (ref 3.5–5.1)
Sodium: 139 mmol/L (ref 135–145)

## 2017-01-27 LAB — VITAMIN D 25 HYDROXY (VIT D DEFICIENCY, FRACTURES): VIT D 25 HYDROXY: 22.6 ng/mL — AB (ref 30.0–100.0)

## 2017-01-27 MED ORDER — CARVEDILOL 3.125 MG PO TABS
3.1250 mg | ORAL_TABLET | Freq: Two times a day (BID) | ORAL | Status: DC
  Administered 2017-01-27 – 2017-01-29 (×4): 3.125 mg via ORAL
  Filled 2017-01-27 (×4): qty 1

## 2017-01-27 MED ORDER — DEXAMETHASONE SODIUM PHOSPHATE 4 MG/ML IJ SOLN
INTRAMUSCULAR | Status: DC | PRN
  Administered 2017-01-26: 10 mg via INTRAVENOUS

## 2017-01-27 MED ORDER — MORPHINE SULFATE (PF) 4 MG/ML IV SOLN: 0.5000 mg | INTRAVENOUS | Status: DC | PRN

## 2017-01-27 MED ORDER — OXYCODONE-ACETAMINOPHEN 5-325 MG PO TABS
1.0000 | ORAL_TABLET | ORAL | Status: DC | PRN
  Administered 2017-01-27 – 2017-01-28 (×5): 2 via ORAL
  Administered 2017-01-29: 1 via ORAL
  Filled 2017-01-27: qty 1
  Filled 2017-01-27 (×5): qty 2

## 2017-01-27 MED ORDER — PROPOFOL 10 MG/ML IV BOLUS
INTRAVENOUS | Status: DC | PRN
  Administered 2017-01-26: 200 mg via INTRAVENOUS

## 2017-01-27 MED ORDER — OXYCODONE-ACETAMINOPHEN 5-325 MG PO TABS: 1.0000 | ORAL_TABLET | ORAL | 0 refills | Status: DC | PRN

## 2017-01-27 MED ORDER — SPIRONOLACTONE 25 MG PO TABS
12.5000 mg | ORAL_TABLET | Freq: Every day | ORAL | Status: DC
  Administered 2017-01-27 – 2017-01-28 (×2): 12.5 mg via ORAL
  Filled 2017-01-27 (×2): qty 1

## 2017-01-27 MED ORDER — ADULT MULTIVITAMIN W/MINERALS CH
1.0000 | ORAL_TABLET | Freq: Every day | ORAL | Status: DC
  Administered 2017-01-28: 1 via ORAL
  Filled 2017-01-27: qty 1

## 2017-01-27 NOTE — Consult Note (Addendum)
   [facility] CM Inpatient Consult   01/27/2017  GREOGRY PFAHL 02/24/1966 [id number]    Screened for potential [facility] Care Management program. Mr. Jacqulyn Ducking is well known to Clinical research associate as he was active with [facility] Care Management several years ago.  Went to bedside to discuss re-engagement with [facility] Care Management program. Mr. Jacqulyn Ducking was drowsy. Unable to keep his eyes open to speak with Clinical research associate.   Will come back at later time to discuss [facility] Care Management services.    Raiford Noble, MSN-Ed, RN,BSN [facility] Liaison [phone]

## 2017-01-27 NOTE — Clinical Social Work Note (Addendum)
Clinical Social Work Assessment  Patient Details  Name: [name] MRN: [id number] Date of Birth: 06-26-1965  Date of referral:  01/27/17               Reason for consult:  Discharge Planning, Facility Placement                Permission sought to share information with:  Case Manager Permission granted to share information::     Name::        Agency::     Relationship::     Contact Information:     Housing/Transportation Living arrangements for the past 2 months:  Single Family Home Source of Information:  Patient, Medical Team Patient Interpreter Needed:  None Criminal Activity/Legal Involvement Pertinent to Current Situation/Hospitalization:  No - Comment as needed Significant Relationships:  Husband, Friend, Other Family Members Lives with:  Husband Do you feel safe going back to the place where you live?  No Need for family participation in patient care:  No (Coment)  Care giving concerns:  Patient reports he requires more assistance than he feels his support person can provide if he returns home.  Reports prior to injury, patient was independent, but when he fainted he broke both legs and an arm.  Reports his pain level is high and he cannot move anything.  Reports he feels SNF will be beneficial at discharge as he will have around the clock nursing care and assistance with rehab.  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  He reports at home he currently has a CNA for care and also has food stamps each month. He receives SSI.    Social Worker assessment / plan:  LCSW consulted for discharge planning and facility placement. LCSW explained role and reason for consult and provided education related to SNF process and placement.  Patient in agreement with SNF, [place] for [place] term rehab.  Plan:  LCSW completed SNF work up.  Will follow up with SNF list and bed offers when available.  Employment status:  Disabled (Comment on whether or not currently receiving  Disability) Insurance information:  Medicare, Medicaid In [place] PT Recommendations:  Skilled Nursing Facility, 24 Hour Supervision Information / Referral to community resources:  Skilled Nursing Facility  Patient/Family's Response to care:  Patient reports he has a lot of pain currently and is cold. Overall he is needing total care as he is limited in movement of legs and his arm.  Agreeable to recommendations for SNF.  Patient reports he wants to also understand why he is fainting and what caused the fall.  Reports this has happened before, but not to this extent.   Patient/Family's Understanding of and Emotional Response to Diagnosis, Current Treatment, and Prognosis:  Patient is optimistic with his treatment and reports "I want to go to a nice facility, none with bed bugs".   Emotional Assessment Appearance:  Appears stated age Attitude/Demeanor/Rapport:    Affect (typically observed):  Accepting, Adaptable, Pleasant Orientation:  Oriented to Place, Oriented to Self, Oriented to  Time, Oriented to Situation Alcohol / Substance use:  Not Applicable Psych involvement (Current and /or in the community):  No (Comment)  Discharge Needs  Concerns to be addressed:  Denies Needs/Concerns at this time Readmission within the last 30 days:  No Current discharge risk:  None Barriers to Discharge:  Continued Medical Work up   Cordella Register 01/27/2017, 8:35 PM

## 2017-01-27 NOTE — Progress Notes (Signed)
PROGRESS NOTE                                                                                                                                                                                                             Patient Demographics:    [name], is a 51 y.o. male, DOB - 1965-10-10, WUJ:[id number]  Admit date - 01/25/2017   Admitting Physician Therisa Doyne, MD  Outpatient Primary MD for the patient is Comer, [name], MD  LOS - 2  Outpatient Specialists: Heart failure Dr. Luciana Axe  Chief Complaint  Patient presents with  . Fall  . Ankle Pain  . Hypotension       Brief Narrative   51 year old male with history of HIV (CD4 count of 540, on ART), hepatitis C,nonischemic cardiomyopathy status post [employer] CRT-D, GERD, hypertension with prior syncopal episodes who had a syncopal episode at home one week back and since then was unable to ambulate independently due to pain. He did not come to the ED initially since he could not take the steps in his house to get out. Patient denies sustaining any head injury, seizures, chest pain, shortness of breath, nausea, vomiting, fevers, chills, abdominal pain, bowel or urinary symptoms. Patient was seen by cardiology one month back for lightheadedness and was found to be orthostatic. Suspected to be dehydration with Lasix which was held along with his Aldactone , BB and Entresto. All his meds were resumed about 2 weeks back.  Patient presented to the ED where his blood pressure was low (92/69 mmHg) . remaining vitals were stable.. Pacemaker was interrogated and was ruled out for V. Tach. Patient was found to have acute kidney injury with creatinine of 3.19 (baseline 1.2). X-ray showed fracture of the first metacarpal head, trimalleolar fracture of the right ankle and fracture of left proximal tibia,.  Admitted to hospitalist service on telemetry. Patient taken to OR today  and underwent ORIF of rt trimalleolar ankle fracture.    Subjective:   Denies significant pain, has still pain in his left wrist and right ankle.   Assessment  & Plan :    Principal Problem:   Closed right ankle fracture secondary to syncope. Underwent ORIF. Pain control with Vicodin and low-dose morphine. Orthopedics following. PT evaluation.  Active Problems: Orthostatic/vasovagal syncope All blood pressure/heart failure medications have been held. hypertension  with dehydration and acute kidney injury.receive gentle hydration. This is likely secondary to hypovolemia from heart failure medications.  Acute kidney injury Secondary to dehydration and diuretics. All blood pressure medications and diuretic held. Avoid NSAIDs. Received  hydration with some improvement this morning. Check labs an a.m.   Chronic systolic CHF Advanced heart failure team following, patient LVEF improved to 65-70% with grade 1 diastolic dysfunction. He was on Entresto, Coreg and diuretics. ? Needs adjustment.  Chronic hepatitis C LFTs stable.    Human immunodeficiency virus (HIV) disease (HCC) CD4 of 540. Continue ART.  Chronic alcohol use Reports drinking 3 beers a week. Currently on CIWA.    Closed fracture of base of first metacarpal of left hand Orthopedics recommend non operative management and maintain with a thumb spica.Nonweightbearing on left hand.    Closed fracture of left tibial plateau Non operative management. Nonweightbearing. Knee immobilizer and elevate leg /apply ice.      Code Status : full code  Family Communication  : none at bedside  Disposition Plan  : pending hospital course, PT evaluation  Barriers For Discharge : Postop  Consults  :   Dr Eulah Pont HF team  Procedures  :  CT left knee ORIF rt ankle fracture    DVT Prophylaxis  :  Lovenox -  Lab Results  Component Value Date   PLT 138 (L) 01/26/2017    Antibiotics  :    Anti-infectives    Start      Dose/Rate Route Frequency Ordered Stop   01/26/17 1800  ceFAZolin (ANCEF) IVPB 1 g/50 mL premix     1 g 100 mL/hr over 30 Minutes Intravenous Every 6 hours 01/26/17 1517 01/27/17 0633   01/26/17 1215  ceFAZolin (ANCEF) IVPB 2g/100 mL premix     2 g 200 mL/hr over 30 Minutes Intravenous To Surgery 01/26/17 1207 01/26/17 1225   01/26/17 1000  dolutegravir (TIVICAY) tablet 50 mg     50 mg Oral Daily 01/25/17 2250     01/26/17 0900  emtricitabine-rilpivir-tenofovir AF (ODEFSEY) 200-25-25 MG per tablet 1 tablet     1 tablet Oral Daily with breakfast 01/25/17 2250          Objective:   Vitals:   01/26/17 2110 01/27/17 0500 01/27/17 0600 01/27/17 1030  BP: 114/77  120/89 116/82  Pulse: 75  80 75  Resp: 15   18  Temp: 98.5 F (36.9 C)  98.8 F (37.1 C) 97.9 F (36.6 C)  TempSrc: Oral  Oral Oral  SpO2: 99%  95% 93%  Weight:  74.9 kg (165 lb 3.2 oz)    Height:        Wt Readings from Last 3 Encounters:  01/27/17 74.9 kg (165 lb 3.2 oz)  12/24/16 73 kg (161 lb)  12/22/16 73 kg (161 lb)     Intake/Output Summary (Last 24 hours) at 01/27/17 1424 Last data filed at 01/26/17 2113  Gross per 24 hour  Intake           358.75 ml  Output              400 ml  Net           -41.25 ml     Physical Exam  Gen: not in distress HEENT: moist mucosa, supple neck Chest: clear b/l, no added sounds CVS: N S1&S2, no murmurs, rubs or gallop GI: soft, NT, ND, BS+ Musculoskeletal: warm, dressing over rt ankle, left knee immobilizer, ace wrap over left hand  CNS: alert and oreinted    Data Review:    CBC  Recent Labs Lab 01/25/17 1655 01/26/17 0613  WBC 9.5 8.7  HGB 13.7 12.2*  HCT 41.5 39.1  PLT 153 138*  MCV 94.1 95.4  MCH 31.1 29.8  MCHC 33.0 31.2  RDW 15.2 15.1  LYMPHSABS 3.2 3.3  MONOABS 0.6 0.6  EOSABS 0.2 0.2  BASOSABS 0.1 0.0    Chemistries   Recent Labs Lab 01/25/17 1655 01/26/17 0613 01/27/17 0514  NA 140 138 139  K 4.6 4.5 4.6  CL 108 110 110  CO2 20*  19* 20*  GLUCOSE 103* 95 92  BUN 39* 36* 16  CREATININE 3.19* 2.36* 1.31*  CALCIUM 9.3 8.9 8.8*  MG  --  2.0  --   AST 93* 84*  --   ALT 68* 56  --   ALKPHOS 148* 109  --   BILITOT 1.9* 1.7*  --    ------------------------------------------------------------------------------------------------------------------ No results for input(s): CHOL, HDL, LDLCALC, TRIG, CHOLHDL, LDLDIRECT in the last 72 hours.  Lab Results  Component Value Date   HGBA1C 4.4 (L) 01/26/2017   ------------------------------------------------------------------------------------------------------------------ No results for input(s): TSH, T4TOTAL, T3FREE, THYROIDAB in the last 72 hours.  Invalid input(s): FREET3 ------------------------------------------------------------------------------------------------------------------ No results for input(s): VITAMINB12, FOLATE, FERRITIN, TIBC, IRON, RETICCTPCT in the last 72 hours.  Coagulation profile No results for input(s): INR, PROTIME in the last 168 hours.  No results for input(s): DDIMER in the last 72 hours.  Cardiac Enzymes  Recent Labs Lab 01/25/17 2315 01/26/17 0613  TROPONINI <0.03 <0.03   ------------------------------------------------------------------------------------------------------------------    Component Value Date/Time   BNP 49.5 12/18/2016 1144    Inpatient Medications  Scheduled Meds: . amiodarone  200 mg Oral Daily  . carvedilol  3.125 mg Oral BID WC  . Chlorhexidine Gluconate Cloth  6 each Topical Daily  . clobetasol ointment  1 application Topical BID  . dolutegravir  50 mg Oral Daily  . emtricitabine-rilpivir-tenofovir AF  1 tablet Oral Q breakfast  . enoxaparin (LOVENOX) injection  40 mg Subcutaneous Q24H  . folic acid  1 mg Oral Daily  . LORazepam  1 mg Oral QHS  . [START ON 01/28/2017] multivitamin with minerals  1 tablet Oral Daily  . mupirocin ointment  1 application Nasal BID  . senna  1 tablet Oral BID  . sodium  chloride flush  3 mL Intravenous Q12H  . spironolactone  12.5 mg Oral QHS  . thiamine  100 mg Oral Daily   Or  . thiamine  100 mg Intravenous Daily  . triamcinolone cream  1 application Topical TID   Continuous Infusions: . lactated ringers 75 mL/hr at 01/27/17 0602  . methocarbamol (ROBAXIN)  IV     PRN Meds:.acetaminophen **OR** acetaminophen, hydrOXYzine, LORazepam **OR** LORazepam, methocarbamol **OR** methocarbamol (ROBAXIN)  IV, mirtazapine, morphine injection, ondansetron (ZOFRAN) IV, oxyCODONE-acetaminophen  Micro Results Recent Results (from the past 240 hour(s))  Surgical pcr screen     Status: Abnormal   Collection Time: 01/25/17 11:12 PM  Result Value Ref Range Status   MRSA, PCR NEGATIVE NEGATIVE Final   Staphylococcus aureus POSITIVE (A) NEGATIVE Final    Comment:        The Xpert SA Assay (FDA approved for NASAL specimens in patients over 54 years of age), is one component of a comprehensive surveillance program.  Test performance has been validated by [facility] for patients greater than or equal to 25 year old. It is not intended to diagnose  infection nor to guide or monitor treatment.     Radiology Reports Dg Chest 2 View  Result Date: 01/25/2017 CLINICAL DATA:  Fall.  Chest pain. EXAM: CHEST  2 VIEW COMPARISON:  01/27/2014.  02/12/2013.  CT 01/20/2013.  CT 01/20/2012 FINDINGS: AICD in stable position. Heart size normal. Stable left base pleuroparenchymal thickening consistent with scarring. Follow-up chest x-ray recommended to exclude an underlying focal lesion/mass. No pleural effusion or pneumothorax. No evidence of displaced rib fracture. IMPRESSION: 1. Stable left base pleuroparenchymal thickening consistent with scarring. No acute abnormality identified . 2. AICD in stable position . Electronically Signed   By: Maisie Fus  Register   On: 01/25/2017 16:33   Dg Ankle Complete Right  Result Date: 01/25/2017 CLINICAL DATA:  Continued acute right ankle pain  following fall 1 week ago. Initial encounter. EXAM: RIGHT ANKLE - COMPLETE 3+ VIEW COMPARISON:  None. FINDINGS: An oblique fracture of the distal fibula (4 mm lateral/ posterior displacement), a comminuted fracture the medial malleolus, and a nondisplaced fracture of the posterior tibial malleolus noted. A 1.5 cm bony fragment anterior to the joint on the lateral view is noted, likely displaced from the distal tibia. There is no evidence of subluxation or dislocation. IMPRESSION: Trimalleolar fractures as described. 1.5 cm bony fragment anterior to the ankle joint on the lateral view likely displaced from the distal tibia. Electronically Signed   By: Harmon Pier M.D.   On: 01/25/2017 16:31   Ct Knee Left Wo Contrast  Result Date: 01/26/2017 CLINICAL DATA:  Proximal tibia fracture secondary to a fall yesterday. EXAM: CT OF THE LEFT KNEE WITHOUT CONTRAST TECHNIQUE: Multidetector CT imaging of the left knee was performed according to the standard protocol. Multiplanar CT image reconstructions were also generated. COMPARISON:  Radiographs dated 01/25/2017 FINDINGS: Bones/Joint/Cartilage There is an avulsion fracture of the posterior aspect of the medial tibial plateau extending across the midline involving the tibial insertion of the posterior cruciate ligament. There is minimal distraction of approximately 3 mm. There is no depression of fracture fragments. No other fractures.  Small joint effusion. Ligaments Suboptimally assessed by CT. ACL appears to be intact. Prominent soft tissue swelling adjacent to the MCL suggesting the possibility of a MCL sprain. LCL appears normal. Muscles and Tendons Negative. Soft tissues Negative. IMPRESSION: Large avulsion fracture involving the posterior aspect of the medial tibial plateau as well as the tibial attachment of the posterior cruciate ligament. Electronically Signed   By: Francene Boyers M.D.   On: 01/26/2017 10:19   Dg Knee Complete 4 Views Left  Result Date:  01/25/2017 CLINICAL DATA:  Patient with diffuse knee pain status post fall. EXAM: LEFT KNEE - COMPLETE 4+ VIEW COMPARISON:  Left knee radiograph 11/23/2012. FINDINGS: Distal femur is unremarkable. There is a mildly distracted oblique fracture through the mid aspect of the proximal tibia extending to the articular surface near the tibial spines. Proximal fibula is unremarkable. Regional soft tissues are unremarkable. IMPRESSION: Fracture of the proximal tibia extending to the articular surface. Electronically Signed   By: Annia Belt M.D.   On: 01/25/2017 16:34   Dg Hand Complete Left  Result Date: 01/25/2017 CLINICAL DATA:  Left hand pain secondary to a fall one week ago. EXAM: LEFT HAND - COMPLETE 3+ VIEW COMPARISON:  None. FINDINGS: There is a slightly displaced the hand angulated fracture of the base of the first metacarpal. No dislocation.  The other bones appear normal. IMPRESSION: Fracture of the base of the proximal first metacarpal with slight angulation and  displacement. Electronically Signed   By: Francene Boyers M.D.   On: 01/25/2017 16:40   Dg Finger Thumb Left  Result Date: 01/25/2017 CLINICAL DATA:  Acute left thumb pain following fall 1 week ago. Initial encounter. EXAM: LEFT THUMB 2+V COMPARISON:  None. FINDINGS: A fracture of the proximal first metacarpal is identified and does not appear to extend to the carpometacarpal joint. Mild apex dorsal angulation is noted. No subluxation or dislocation. IMPRESSION: Proximal first metacarpal fracture. Electronically Signed   By: Harmon Pier M.D.   On: 01/25/2017 16:33    Time Spent in minutes 35   Clint Lipps M.D on 01/27/2017 at 2:24 PM  Between 7am to 7pm - Pager - [phone]  After 7pm go to [email or web address] - password [facility]  [facility] -  Office  [phone]

## 2017-01-27 NOTE — Evaluation (Signed)
Occupational Therapy Evaluation Patient Details Name: [name] MRN: [id number] DOB: 06-Sep-1965 Today's Date: 01/27/2017    History of Present Illness Pt is a 51 y.o. male who presented to the ED with inability to ambulate after a syncope episode resulting in a fall one week prior to presentation. Pt found to have fracutre of base of first metacarpal of L hand, closed trimalleolar ankle fracture s/p ORIF 8/28, and closed L tibial plateau fracture. PMH signficant for CHF, chronic systolic heart failure, depression, G6PD deficiency, GERD< hepatitis, HIV, hypertension, nonischemic cardiomyopathy. Pt with pacemaker.    Clinical Impression   PTA, pt had assistance with ADL from aide 3 hours per day and was ambulating with crutches. Since syncopal episode and fall, pt has required significant assistance from husband for all aspects of ADL and mobility. He currently requires total assist for LB ADL and max assist for UB ADL. He was able to complete lateral scoot transfer from traditional bed to low bed with max assist +3 this session. Feel pt would benefit from continued OT services while admitted in order to improve independence with ADL and functional mobility. Recommend SNF level rehabilitation post-acute D/C to maximize safety and independence prior to D/C home with assistance from husband. OT will continue to follow while admitted.    Follow Up Recommendations  SNF;Supervision/Assistance - 24 hour    Equipment Recommendations  Other (comment) (TBD at next venue of care)    Recommendations for Other Services       Precautions / Restrictions Precautions Required Braces or Orthoses: Knee Immobilizer - Left Knee Immobilizer - Left: On at all times Restrictions Weight Bearing Restrictions: Yes LUE Weight Bearing: Weight bear through elbow only RLE Weight Bearing: Non weight bearing LLE Weight Bearing: Non weight bearing      Mobility Bed Mobility               General bed  mobility comments: Able to come to long sitting position without physical assistance in preparation for lateral scoot transfer.   Transfers Overall transfer level: Needs assistance   Transfers: Lateral/Scoot Transfers          Lateral/Scoot Transfers: Max assist;+2 physical assistance General transfer comment: +3 assistance helpful to manage B LE. Pt with poor motor planning and sequencing to complete task.    Balance                                           ADL either performed or assessed with clinical judgement   ADL Overall ADL's : Needs assistance/impaired Eating/Feeding: Minimal assistance;Bed level   Grooming: Moderate assistance;Bed level   Upper Body Bathing: Maximal assistance;Bed level   Lower Body Bathing: Total assistance;Bed level   Upper Body Dressing : Maximal assistance;Bed level   Lower Body Dressing: Total assistance;Bed level     Toilet Transfer Details (indicate cue type and reason): Able to complete lateral scoot from bed to bed with max assist +3 (husband assisting to manage B LE). Toileting- Clothing Manipulation and Hygiene: Total assistance;Bed level         General ADL Comments: Pt with poor sequencing and motor planning skills to complete mobility while maintaining NWB status despite multimodal cues.      Vision Patient Visual Report: No change from baseline Additional Comments: Noted cyclical eye movement. Pt reports no changes in vision.      Perception  Praxis      Pertinent Vitals/Pain Pain Assessment: 0-10 Pain Score: 10-Worst pain ever Pain Intervention(s): Limited activity within patient's tolerance;Monitored during session;Premedicated before session     Hand Dominance Left   Extremity/Trunk Assessment Upper Extremity Assessment Upper Extremity Assessment: LUE deficits/detail LUE Deficits / Details: Thumb spica splint present during session.    Lower Extremity Assessment Lower Extremity  Assessment: Defer to PT evaluation       Communication Communication Communication: No difficulties   Cognition Arousal/Alertness: Awake/alert Behavior During Therapy: WFL for tasks assessed/performed Overall Cognitive Status: Impaired/Different from baseline Area of Impairment: Problem solving                             Problem Solving: Slow processing;Difficulty sequencing General Comments: Pt with noted sequencing deficits during transfer from bed to bed. Multimodal cues for maintaining NWB LLE, RLE, and LUE.   General Comments       Exercises     Shoulder Instructions      Home Living Family/patient expects to be discharged to:: Private residence Living Arrangements: Spouse/significant other Available Help at Discharge: Family;Available 24 hours/day Type of Home: House Home Access: Stairs to enter [employer] of Steps: 4-6         Bathroom Shower/Tub: Tub/shower unit         Home Equipment: Crutches          Prior Functioning/Environment Level of Independence: Needs assistance  Gait / Transfers Assistance Needed: Using crutches PTA. ADL's / Homemaking Assistance Needed: Aide assists with dressing/bathing 3 hours a day.   Comments: Husband has had to provide total assistance since fall.         OT Problem List: Decreased strength;Decreased range of motion;Decreased activity tolerance;Decreased safety awareness;Decreased knowledge of precautions;Decreased cognition;Pain      OT Treatment/Interventions: Self-care/ADL training;Therapeutic exercise;Energy conservation;Cognitive remediation/compensation;Patient/family education;Therapeutic activities;DME and/or AE instruction    OT Goals(Current goals can be found in the care plan Rollins) Acute Rehab OT Goals Patient Stated Goal: to have less pain OT Goal Formulation: With patient/family Time For Goal Achievement: 02/10/17 Potential to Achieve Goals: Good  OT Frequency: Min  2X/week   Barriers to D/C:            Co-evaluation PT/OT/SLP Co-Evaluation/Treatment: Yes Reason for Co-Treatment: Complexity of the patient's impairments (multi-system involvement);For patient/therapist safety;To address functional/ADL transfers   OT goals addressed during session: ADL's and self-care      AM-PAC PT "6 Clicks" Daily Activity     Outcome Measure Help from another person eating meals?: A Little Help from another person taking care of personal grooming?: A Little Help from another person toileting, which includes using toliet, bedpan, or urinal?: Total Help from another person bathing (including washing, rinsing, drying)?: Total Help from another person to put on and taking off regular upper body clothing?: A Lot Help from another person to put on and taking off regular lower body clothing?: Total 6 Click Score: 11   End of Session Nurse Communication: Mobility status;Need for lift equipment;Weight bearing status  Activity Tolerance: Patient tolerated treatment well Patient left: in bed;with call bell/phone within reach;with family/visitor present  OT Visit Diagnosis: Muscle weakness (generalized) (M62.81);Pain Pain - Right/Left: Left Pain - part of body: Leg (R LE, L wrist)                Time: 9563-8756 OT Time Calculation (min): 27 min Charges:  OT General Charges $OT Visit: 1 Visit  OT Evaluation $OT Eval Moderate Complexity: 1 Mod G-Codes:     David Section, MS OTR/L  Pager: [phone]   [name] 01/27/2017, 4:29 PM

## 2017-01-27 NOTE — Progress Notes (Signed)
Advanced Heart Failure Rounding Note  Primary Cardiologist: Dr. [name]   Subjective:    S/p ORIF of R ankle fracture 01/26/17  He states he remains lightheadedness but is overall feeling better. Groggy currently with pain medications. Denies SOB.   Pressures improved. Systolic ranged from 110 - 140. Creatinine back to baseline (3.1 -> 2.3 -> 1.3)  Echo 01/26/17 LVEF 65-70%, Grade 1 DD  Objective:   Weight Range: 165 lb 3.2 oz (74.9 kg) Body mass index is 28.36 kg/m.   Vital Signs:   Temp:  [97.3 F (36.3 C)-98.8 F (37.1 C)] 97.9 F (36.6 C) (08/29 1030) Pulse Rate:  [67-80] 75 (08/29 1030) Resp:  [9-23] 18 (08/29 1030) BP: (114-149)/(77-93) 116/82 (08/29 1030) SpO2:  [93 %-99 %] 93 % (08/29 1030) Weight:  [165 lb 3.2 oz (74.9 kg)] 165 lb 3.2 oz (74.9 kg) (08/29 0500) Last BM Date: 01/25/17  Weight change: Filed Weights   01/25/17 1902 01/25/17 2245 01/27/17 0500  Weight: 161 lb (73 kg) 162 lb (73.5 kg) 165 lb 3.2 oz (74.9 kg)    Intake/Output:   Intake/Output Summary (Last 24 hours) at 01/27/17 1034 Last data filed at 01/26/17 2113  Gross per 24 hour  Intake          1158.75 ml  Output              420 ml  Net           738.75 ml      Physical Exam    General:  Tired. NAD.  HEENT: Normal Neck: Supple. JVP 6-7. Carotids 2+ bilat; no bruits. No lymphadenopathy or thyromegaly appreciated. Cor: PMI nondisplaced. Regular rate & rhythm. No rubs, gallops or murmurs. Lungs: Clear Abdomen: Soft, nontender, nondistended. No hepatosplenomegaly. No bruits or masses. Good bowel sounds. Extremities: No cyanosis, clubbing, rash, or edema. RLE with wraps. CDI. LLE in brace.  Neuro: Alert & orientedx3, cranial nerves grossly intact. moves all 4 extremities w/o difficulty. Affect pleasant   Telemetry   Not connected.   EKG    N/A  Labs    CBC  Recent Labs  01/25/17 1655 01/26/17 0613  WBC 9.5 8.7  NEUTROABS 5.5 4.5  HGB 13.7 12.2*  HCT 41.5 39.1    MCV 94.1 95.4  PLT 153 138*   Basic Metabolic Panel  Recent Labs  01/26/17 0613 01/27/17 0514  NA 138 139  K 4.5 4.6  CL 110 110  CO2 19* 20*  GLUCOSE 95 92  BUN 36* 16  CREATININE 2.36* 1.31*  CALCIUM 8.9 8.8*  MG 2.0  --   PHOS 5.0*  --    Liver Function Tests  Recent Labs  01/25/17 1655 01/26/17 0613  AST 93* 84*  ALT 68* 56  ALKPHOS 148* 109  BILITOT 1.9* 1.7*  PROT 8.0 6.6  ALBUMIN 3.4* 2.9*   No results for input(s): LIPASE, AMYLASE in the last 72 hours. Cardiac Enzymes  Recent Labs  01/25/17 2315 01/26/17 0613  TROPONINI <0.03 <0.03    BNP: BNP (last 3 results)  Recent Labs  12/18/16 1144  BNP 49.5    ProBNP (last 3 results) No results for input(s): PROBNP in the last 8760 hours.   D-Dimer No results for input(s): DDIMER in the last 72 hours. Hemoglobin A1C  Recent Labs  01/26/17 0613  HGBA1C 4.4*   Fasting Lipid Panel No results for input(s): CHOL, HDL, LDLCALC, TRIG, CHOLHDL, LDLDIRECT in the last 72 hours. Thyroid Function Tests No  results for input(s): TSH, T4TOTAL, T3FREE, THYROIDAB in the last 72 hours.  Invalid input(s): FREET3  Other results:   Imaging     No results found.   Medications:     Scheduled Medications: . amiodarone  200 mg Oral Daily  . Chlorhexidine Gluconate Cloth  6 each Topical Daily  . clobetasol ointment  1 application Topical BID  . dolutegravir  50 mg Oral Daily  . emtricitabine-rilpivir-tenofovir AF  1 tablet Oral Q breakfast  . enoxaparin (LOVENOX) injection  40 mg Subcutaneous Q24H  . folic acid  1 mg Oral Daily  . LORazepam  1 mg Oral QHS  . [START ON 01/28/2017] multivitamin with minerals  1 tablet Oral Daily  . mupirocin ointment  1 application Nasal BID  . senna  1 tablet Oral BID  . sodium chloride flush  3 mL Intravenous Q12H  . thiamine  100 mg Oral Daily   Or  . thiamine  100 mg Intravenous Daily  . triamcinolone cream  1 application Topical TID     Infusions: .  lactated ringers 75 mL/hr at 01/27/17 0602  . methocarbamol (ROBAXIN)  IV       PRN Medications:  acetaminophen **OR** acetaminophen, hydrOXYzine, LORazepam **OR** LORazepam, methocarbamol **OR** methocarbamol (ROBAXIN)  IV, mirtazapine, morphine injection, ondansetron (ZOFRAN) IV, oxyCODONE-acetaminophen    Patient Profile   [name] is a 51 y.o. male  with a history of HIV 1998, HCV, ETOH abuse and CHF due to NICM s/p [employer] CRT-D (06/2013). He also has a h/o syncope and previously had an EP study which was non-inducible for VT.  Admitted for close R ankle, L knee, and L wrist fracture in setting of syncope and collapse. ICD interrogation negative for VT.   Assessment/Plan   1. Syncope and collapse - Likely vasovagal with hypotension and AKI - HF meds on hold. Remains mildly lightheaded at time but creatinine improved and BP trending up.   - Will slowly resume low doses of meds  2. Chronic systolic CHF - Echo 01/26/17 LVEF 65-70%, Grade 1 DD - No Entresto today.  - Add low dose spiro 12.5 mg qhs tonight.  - Very low dose coreg start back this evening at 3.125 mg BID.  - Weight up 3 lbs. + 1500 cc so far. Hypotensive on arrival so will follow. - Will likely consider lasix tomorrow. Volume status looks stable today.   3. Hepatitis C - LFTs stable. Per primary.   4. HIV - Per primary.   5. H/o VT - No VT noted on ICD interrogation. Continue amiodarone. No change.   6. 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   Length of Stay: 2   Luane School  01/27/2017, 10:34 AM  Advanced Heart Failure Team Pager [phone] (M-F; 7a - 4p)  Please contact [facility] for night-coverage after hours (4p -7a ) and weekends on [email or web address]  Patient seen and examined with the above-signed Advanced Practice Provider and/or Housestaff. I personally reviewed laboratory data, imaging studies and relevant notes. I independently examined the patient and formulated the  important aspects of the plan. I have edited the note to reflect any of my changes or salient points. I have personally discussed the plan with the patient and/or family.  Stable post-op. BP stable. Renal function improved. Agree with med changes as above.   [name], MD  4:37 PM

## 2017-01-27 NOTE — Progress Notes (Signed)
Pt has a tight hair weave and refused removal. Refused EEG even after explaining the test.

## 2017-01-27 NOTE — Evaluation (Signed)
Physical Therapy Evaluation Patient Details Name: [name] MRN: [id number] DOB: [birth year] Today's Date: 01/27/2017   History of Present Illness  Pt is a 51 y.o. male who presented to the ED with inability to ambulate after a syncope episode resulting in a fall one week prior to presentation. Pt found to have fracutre of base of first metacarpal of L hand, closed trimalleolar ankle fracture s/p ORIF 8/28, and closed L tibial plateau fracture. PMH signficant for CHF, chronic systolic heart failure, depression, G6PD deficiency, GERD< hepatitis, HIV, hypertension, nonischemic cardiomyopathy. Pt with pacemaker.   Clinical Impression  Pt presented supine in bed with HOB elevated, awake and willing to participate in therapy session. Pt's friend and husband present throughout session. Prior to admission, pt reported that he used bilateral axillary crutches to mobilize and required assistance from an aide for ADLs. Pt currently requires max A x3 for lateral scoot transfers. Pt would continue to benefit from skilled physical therapy services at this time while admitted and after d/c to address the below listed limitations in order to improve overall safety and independence with functional mobility.     Follow Up Recommendations SNF;Supervision/Assistance - 24 hour    Equipment Recommendations  Other (comment) (defer to next venue)    Recommendations for Other Services       Precautions / Restrictions Precautions Precautions: Fall Required Braces or Orthoses: Knee Immobilizer - Left Knee Immobilizer - Left: On at all times Restrictions Weight Bearing Restrictions: Yes LUE Weight Bearing: Weight bear through elbow only RLE Weight Bearing: Non weight bearing LLE Weight Bearing: Non weight bearing      Mobility  Bed Mobility Overal bed mobility: Needs Assistance             General bed mobility comments: pt able to achieve long sitting position with supervision in preparation for  lateral scoot transfer  Transfers Overall transfer level: Needs assistance   Transfers: Lateral/Scoot Transfers          Lateral/Scoot Transfers: Max assist;+2 physical assistance General transfer comment: +3 assistance helpful to manage B LE. Pt with poor motor planning and sequencing to complete task. pt transferred from one bed to high-low bed  Ambulation/Gait                Stairs            Wheelchair Mobility    Modified Rankin (Stroke Patients Only)       Balance Overall balance assessment: Needs assistance Sitting-balance support: Feet supported;Single extremity supported Sitting balance-Leahy Scale: Poor Sitting balance - Comments: long sitting with R UE support                                     Pertinent Vitals/Pain Pain Assessment: 0-10 Pain Score: 10-Worst pain ever Pain Location: bilateral LEs Pain Descriptors / Indicators: Sore;Grimacing;Guarding;Moaning Pain Intervention(s): Monitored during session;Repositioned;Premedicated before session    Home Living Family/patient expects to be discharged to:: Private residence Living Arrangements: Spouse/significant other Available Help at Discharge: Family;Available 24 hours/day Type of Home: House Home Access: Stairs to enter   [employer] of Steps: 4-6   Home Equipment: Crutches      Prior Function Level of Independence: Needs assistance   Gait / Transfers Assistance Needed: Using crutches PTA.  ADL's / Homemaking Assistance Needed: Aide assists with dressing/bathing 3 hours a day.  Comments: Husband has had to provide total assistance since fall.  Hand Dominance   Dominant Hand: Left    Extremity/Trunk Assessment   Upper Extremity Assessment Upper Extremity Assessment: Defer to OT evaluation LUE Deficits / Details: Thumb spica splint present during session.     Lower Extremity Assessment Lower Extremity Assessment: Generalized weakness (NWB  bilateral LEs, limited secondary to pain)       Communication   Communication: No difficulties  Cognition Arousal/Alertness: Awake/alert Behavior During Therapy: WFL for tasks assessed/performed Overall Cognitive Status: Impaired/Different from baseline Area of Impairment: Problem solving                             Problem Solving: Slow processing;Difficulty sequencing General Comments: pt required multimodal cueing to maintain weight bearing restrictions throughout      General Comments      Exercises     Assessment/Plan    PT Assessment Patient needs continued PT services  PT Problem List Decreased strength;Decreased range of motion;Decreased activity tolerance;Decreased balance;Decreased mobility;Decreased coordination;Decreased cognition;Decreased knowledge of use of DME;Decreased safety awareness;Decreased knowledge of precautions;Pain       PT Treatment Interventions      PT Goals (Current goals can be found in the Care Plan section)  Acute Rehab PT Goals Patient Stated Goal: to have less pain PT Goal Formulation: With patient Time For Goal Achievement: 02/10/17 Potential to Achieve Goals: Fair    Frequency Min 2X/week   Barriers to discharge        Co-evaluation PT/OT/SLP Co-Evaluation/Treatment: Yes Reason for Co-Treatment: Complexity of the patient's impairments (multi-system involvement);For patient/therapist safety;To address functional/ADL transfers PT goals addressed during session: Mobility/safety with mobility;Balance;Strengthening/ROM OT goals addressed during session: ADL's and self-care       AM-PAC PT "6 Clicks" Daily Activity  Outcome Measure Difficulty turning over in bed (including adjusting bedclothes, sheets and blankets)?: A Lot Difficulty moving from lying on back to sitting on the side of the bed? : A Lot Difficulty sitting down on and standing up from a chair with arms (e.g., wheelchair, bedside commode, etc,.)?:  Unable Help needed moving to and from a bed to chair (including a wheelchair)?: Total Help needed walking in hospital room?: Total Help needed climbing 3-5 steps with a railing? : Total 6 Click Score: 8    End of Session   Activity Tolerance: Patient limited by pain Patient left: in bed;with call bell/phone within reach;with family/visitor present Nurse Communication: Mobility status;Need for lift equipment;Weight bearing status PT Visit Diagnosis: Other abnormalities of gait and mobility (R26.89);Pain Pain - Right/Left:  (bilateral) Pain - part of body: Leg    Time: 1451-1518 PT Time Calculation (min) (ACUTE ONLY): 27 min   Charges:   PT Evaluation $PT Eval Moderate Complexity: 1 Mod     PT G Codes:        [place], PT, DPT [phone]   Alessandra Bevels Jori Thrall 01/27/2017, 5:17 PM

## 2017-01-27 NOTE — Progress Notes (Addendum)
Assessment / Plan: 1 Day Post-Op  S/P Procedure(s) (LRB): OPEN REDUCTION INTERNAL FIXATION (ORIF) ANKLE FRACTURE (Right)  by Dr. Jewel Baize. Eulah Pont on 01/26/17  Principal Problem:   Closed right ankle fracture Active Problems:   Human immunodeficiency virus (HIV) disease (HCC)   Chronic hepatitis C without hepatic coma (HCC)   Essential hypertension   Alcohol abuse   Cardiomyopathy (HCC)   Chronic systolic heart failure (HCC)   PTSD (post-traumatic stress disorder)   Dehydration   Hypotension   Skin rash   Elevated liver enzymes   Closed fracture of base of first metacarpal of left hand   Closed fracture of left tibial plateau   Syncope   HIV (human immunodeficiency virus infection) (HCC)   ARF (acute renal failure) (HCC)   Closed fracture of left proximal tibia   Multiple fractures   Nonischemic cardiomyopathy (HCC)   Syncope and collapse  Closed Right trimalleolar ankle fracture Difficult pain control s/p ORIF 11/26/16 - Will adjust pain medications Nonweightbearing Maintain Splint Elevate and apply ice   Closed Left proximal first metacarpal fracture Nonoperative management Maintain thumb spica Nonweightbearing-WB okay through elbow  Closed Left tibial plateau fracture Nonoperative management. Nonweightbearing  Knee immobilizer at all times Elevate and apply ice  Weight Bearing Status:  NWB B LE 6+ weeks NWB LUE (okay to bear weight through elbow) Incentive Spirometry Elevate and apply ice  VTE prophylaxis: Lovenox while inpatient, SCDs, ambulation.   Dispo: Pending PT evals, but given he is NWB Bilateral Lower Extremities and Left Hand, SNF or CIR is appropriate.  Follow up in the office or in CIR with Dr. Wandra Feinstein in 2 weeks.  Will remove sutures RLE and likely transition to cast at that time.  Please call with questions.  Subjective: Patient reports pain as moderate to severe.    Objective:   VITALS:   Vitals:   01/26/17 1805 01/26/17 2110  01/27/17 0500 01/27/17 0600  BP: 120/84 114/77  120/89  Pulse: 75 75  80  Resp: 15 15    Temp: 98.2 F (36.8 C) 98.5 F (36.9 C)  98.8 F (37.1 C)  TempSrc: Oral Oral  Oral  SpO2: 96% 99%  95%  Weight:   74.9 kg (165 lb 3.2 oz)   Height:       CBC Latest Ref Rng & Units 01/26/2017 01/25/2017 12/19/2016  WBC 4.0 - 10.5 K/uL 8.7 9.5 8.2  Hemoglobin 13.0 - 17.0 g/dL 12.2(L) 13.7 10.2(L)  Hematocrit 39.0 - 52.0 % 39.1 41.5 32.9(L)  Platelets 150 - 400 K/uL 138(L) 153 121(L)   BMP Latest Ref Rng & Units 01/27/2017 01/26/2017 01/25/2017  Glucose 65 - 99 mg/dL 92 95 161(W)  BUN 6 - 20 mg/dL 16 96(E) 45(W)  Creatinine 0.61 - 1.24 mg/dL 0.98(J) 1.91(Y) 7.82(N)  Sodium 135 - 145 mmol/L 139 138 140  Potassium 3.5 - 5.1 mmol/L 4.6 4.5 4.6  Chloride 101 - 111 mmol/L 110 110 108  CO2 22 - 32 mmol/L 20(L) 19(L) 20(L)  Calcium 8.9 - 10.3 mg/dL 5.6(O) 8.9 9.3   Intake/Output      08/28 0701 - 08/29 0700 08/29 0701 - 08/30 0700   P.O. 120    I.V. (mL/kg) 988.8 (13.2)    IV Piggyback 50    Total Intake(mL/kg) 1158.8 (15.5)    Urine (mL/kg/hr) 550 (0.3)    Blood 20    Total Output 570     Net +588.8  Physical Exam: General: Uncomfortable appearing, NAD.  Calm, conversant.  Supine in bed.  No increased WOB MUSCULOSKELETAL:  Left upper extremity with thumb spica splint in place and in good condition. Sensation intact. Left lower extremity in knee immobilizer. Knee without ecchymosis or effusion. Tender to palpation. Right lower extremity: posterior/lateral splint in place good condition. +EHL, FHL, sensation intact. Compartments soft around splint.   Lucretia Kern Martensen III, PA-C 01/27/2017, 8:00 AM

## 2017-01-27 NOTE — Addendum Note (Signed)
Addendum  created 01/27/17 0813 by Adair Laundry, CRNA   Anesthesia Intra Meds edited

## 2017-01-27 NOTE — Clinical Social Work Placement (Addendum)
   CLINICAL SOCIAL WORK PLACEMENT  NOTE  Date:  01/27/2017  Patient Details  Name: [name] MRN: [id number] Date of Birth: 1965/07/23  Clinical Social Work is seeking post-discharge placement for this patient at the Skilled  Nursing Facility level of care (*CSW will initial, date and re-position this form in  chart as items are completed):  Yes   Patient/family provided with [facility] Clinical Social Work Department's list of facilities offering this level of care within the geographic area requested by the patient (or if unable, by the patient's family).  Yes   Patient/family informed of their freedom to choose among providers that offer the needed level of care, that participate in Medicare, Medicaid or managed care program needed by the patient, have an available bed and are willing to accept the patient.  Yes   Patient/family informed of [facility]'s ownership interest in [facility] and [facility], as well as of the fact that they are under no obligation to receive care at these facilities.  PASRR submitted to EDS on       PASRR number received on       Existing PASRR number confirmed on       FL2 transmitted to all facilities in geographic area requested by pt/family on 01/27/17     FL2 transmitted to all facilities within larger geographic area on       Patient informed that his/her managed care company has contracts with or will negotiate with certain facilities, including the following:            Patient/family informed of bed offers received.  Patient [facility] bed at [facility]     Physician recommends and patient chooses bed at      Patient to be transferred to [facility] on 8/31.  Patient to be transferred to facility by [facility]     Patient family notified on 8/31 of transfer.  Name of family member notified:  Patient     PHYSICIAN Please sign FL2     Additional Comment:    _______________________________________________ Raye Sorrow,  LCSW 01/27/2017, 9:11 PM

## 2017-01-27 NOTE — [place] FL2 (Addendum)
[place] MEDICAID FL2 LEVEL OF CARE SCREENING TOOL     IDENTIFICATION  Patient Name: [name] Birthdate: 1965-07-03 Sex: male Admission Date (Current Location): 01/25/2017  [facility] and [place] Number:  Producer, television/film/video and Address:  [facility], [address], [place], [place] [address]      Provider Number: [id number]  Attending Physician Name and Address:  Clydia Llano, MD  Relative Name and Phone Number:       Current Level of Care: Hospital Recommended Level of Care: Skilled Nursing Facility Prior Approval Number:    Date Approved/Denied:   PASRR Number: [id number]   Discharge Plan: SNF    Current Diagnoses: Patient Active Problem List   Diagnosis Date Noted  . Nonischemic cardiomyopathy (HCC)   . Syncope and collapse   . Closed fracture of base of first metacarpal of left hand 01/25/2017  . Closed right ankle fracture 01/25/2017  . Closed fracture of left tibial plateau 01/25/2017  . Syncope 01/25/2017  . HIV (human immunodeficiency virus infection) (HCC) 01/25/2017  . ARF (acute renal failure) (HCC) 01/25/2017  . Closed fracture of left proximal tibia 01/25/2017  . Multiple fractures 01/25/2017  . Skin rash   . Elevated liver enzymes   . Hypotension 12/18/2016  . Scabies 09/17/2016  . Screening examination for venereal disease 03/21/2015  . Encounter for long-term (current) use of medications 03/21/2015  . Pain in joint, ankle and foot 03/21/2015  . Insomnia 06/26/2014  . Dehydration 02/22/2014  . AKI (acute kidney injury) (HCC) 02/22/2014  . Hypokalemia 02/04/2014  . Ventricular tachycardia, polymorphic (HCC) 02/03/2014  . Headache 02/03/2014  . PTSD (post-traumatic stress disorder) 02/03/2014  . Implantable cardioverter-defibrillator (ICD) discharge 01/27/2014  . Sustained VT (ventricular tachycardia) (HCC) 01/27/2014  . ICD (implantable cardioverter-defibrillator), biventricular, in situ 01/23/2014  .  Paroxysmal ventricular fibrillation (HCC) 01/19/2014  . Gout attack 04/24/2013  . Protein-calorie malnutrition, severe (HCC) 02/13/2013  . GERD (gastroesophageal reflux disease) 08/19/2012  . Chronic systolic heart failure (HCC) 01/25/2012  . Wide-complex tachycardia (HCC) 01/07/2012  . Cardiomyopathy (HCC) 12/07/2011  . Alcohol abuse 11/30/2011  . Marijuana abuse 11/30/2011  . Human immunodeficiency virus (HIV) disease (HCC) 06/09/2006  . GENITAL HERPES 06/09/2006  . Chronic hepatitis C without hepatic coma (HCC) 06/09/2006  . Essential hypertension 06/09/2006    Orientation RESPIRATION BLADDER Height & Weight     Self, Time, Situation, Place  Normal Continent Weight: 165 lb 3.2 oz (74.9 kg) Height:  5\' 4"  (162.6 cm)  BEHAVIORAL SYMPTOMS/MOOD NEUROLOGICAL BOWEL NUTRITION STATUS      Continent Diet (See DC summary)  AMBULATORY STATUS COMMUNICATION OF NEEDS Skin   Extensive Assist Verbally Surgical wounds, Skin abrasions, Bruising   Skin rash (chronic)                       Personal Care Assistance Level of Assistance  Bathing, Feeding, Dressing Bathing Assistance: Maximum assistance Feeding assistance: Limited assistance Dressing Assistance: Maximum assistance     Functional Limitations Info  Sight, Hearing, Speech Sight Info: Adequate Hearing Info: Adequate Speech Info: Adequate    SPECIAL CARE FACTORS FREQUENCY  PT (By licensed PT), OT (By licensed OT)     PT Frequency: 2x OT Frequency: 2x            Contractures Contractures Info: Not present    Additional Factors Info  Code Status, Allergies Code Status Info: Full Code Allergies Info: Bactrim  Current Medications (01/27/2017):  This is the current hospital active medication list Current Facility-Administered Medications  Medication Dose Route Frequency Provider Last Rate Last Dose  . acetaminophen (TYLENOL) tablet 650 mg  650 mg Oral Q6H PRN Albina Billet III, PA-C       Or   . acetaminophen (TYLENOL) suppository 650 mg  650 mg Rectal Q6H PRN Albina Billet III, PA-C      . amiodarone (PACERONE) tablet 200 mg  200 mg Oral Daily Doutova, Anastassia, MD   200 mg at 01/27/17 0942  . carvedilol (COREG) tablet 3.125 mg  3.125 mg Oral BID WC Graciella Freer, PA-C   3.125 mg at 01/27/17 1656  . Chlorhexidine Gluconate Cloth 2 % PADS 6 each  6 each Topical Daily Clydie Braun, MD   6 each at 01/27/17 0944  . clobetasol ointment (TEMOVATE) 0.05 % 1 application  1 application Topical BID Therisa Doyne, MD   1 application at 01/27/17 1721  . dolutegravir (TIVICAY) tablet 50 mg  50 mg Oral Daily Doutova, Jonny Ruiz, MD   50 mg at 01/27/17 0941  . emtricitabine-rilpivir-tenofovir AF (ODEFSEY) 200-25-25 MG per tablet 1 tablet  1 tablet Oral Q breakfast Therisa Doyne, MD   1 tablet at 01/27/17 0941  . enoxaparin (LOVENOX) injection 40 mg  40 mg Subcutaneous Q24H Albina Billet III, PA-C   40 mg at 01/27/17 0941  . folic acid (FOLVITE) tablet 1 mg  1 mg Oral Daily Doutova, Anastassia, MD   1 mg at 01/27/17 1610  . hydrOXYzine (ATARAX/VISTARIL) tablet 50 mg  50 mg Oral TID PRN Therisa Doyne, MD      . lactated ringers infusion   Intravenous Continuous Albina Billet III, PA-C 75 mL/hr at 01/27/17 0602    . LORazepam (ATIVAN) tablet 1 mg  1 mg Oral Q6H PRN Therisa Doyne, MD       Or  . LORazepam (ATIVAN) injection 1 mg  1 mg Intravenous Q6H PRN Doutova, Anastassia, MD      . LORazepam (ATIVAN) tablet 1 mg  1 mg Oral QHS Doutova, Anastassia, MD   1 mg at 01/26/17 2128  . methocarbamol (ROBAXIN) tablet 500 mg  500 mg Oral Q6H PRN Therisa Doyne, MD   500 mg at 01/27/17 1656   Or  . methocarbamol (ROBAXIN) 500 mg in dextrose 5 % 50 mL IVPB  500 mg Intravenous Q6H PRN Doutova, Anastassia, MD      . mirtazapine (REMERON) tablet 7.5 mg  7.5 mg Oral QHS PRN Doutova, Anastassia, MD   7.5 mg at 01/25/17 2331  . morphine 4 MG/ML  injection 0.52-1 mg  0.52-1 mg Intravenous Q2H PRN Albina Billet III, PA-C      . [START ON 01/28/2017] multivitamin with minerals tablet 1 tablet  1 tablet Oral Daily Elmahi, Mutaz, MD      . mupirocin ointment (BACTROBAN) 2 % 1 application  1 application Nasal BID Clydie Braun, MD   1 application at 01/27/17 [phone]  . ondansetron (ZOFRAN) injection 4 mg  4 mg Intravenous Q6H PRN Dhungel, Nishant, MD   4 mg at 01/26/17 1837  . oxyCODONE-acetaminophen (PERCOCET/ROXICET) 5-325 MG per tablet 1-2 tablet  1-2 tablet Oral Q4H PRN Albina Billet III, PA-C   2 tablet at 01/27/17 1358  . senna (SENOKOT) tablet 8.6 mg  1 tablet Oral BID Therisa Doyne, MD   8.6 mg at 01/27/17 0941  . sodium chloride flush (NS) 0.9 % injection  3 mL  3 mL Intravenous Q12H Doutova, Anastassia, MD   3 mL at 01/25/17 2336  . spironolactone (ALDACTONE) tablet 12.5 mg  12.5 mg Oral QHS Graciella Freer, PA-C      . thiamine (VITAMIN B-1) tablet 100 mg  100 mg Oral Daily Doutova, Anastassia, MD   100 mg at 01/27/17 0941   Or  . thiamine (B-1) injection 100 mg  100 mg Intravenous Daily Doutova, Anastassia, MD      . triamcinolone cream (KENALOG) 0.1 % 1 application  1 application Topical TID Therisa Doyne, MD   1 application at 01/27/17 1723     Discharge Medications: Please see discharge summary for a list of discharge medications.  Relevant Imaging Results:  Relevant Lab Results:   Additional Information SSN: [id number]   Raye Sorrow, [place]

## 2017-01-27 NOTE — Progress Notes (Signed)
*  PRELIMINARY RESULTS* Vascular Ultrasound Carotid Duplex (Doppler) has been completed.   Findings suggest 1-39% internal carotid artery stenosis bilaterally. Vertebral arteries are patent with antegrade flow.  01/27/2017 8:53 AM Gertie Fey, BS, RVT, RDCS, RDMS

## 2017-01-28 ENCOUNTER — Encounter (HOSPITAL_COMMUNITY): Payer: Self-pay | Admitting: Orthopedic Surgery

## 2017-01-28 LAB — BASIC METABOLIC PANEL
ANION GAP: 6 (ref 5–15)
BUN: 10 mg/dL (ref 6–20)
CHLORIDE: 110 mmol/L (ref 101–111)
CO2: 21 mmol/L — AB (ref 22–32)
Calcium: 8.8 mg/dL — ABNORMAL LOW (ref 8.9–10.3)
Creatinine, Ser: 1.15 mg/dL (ref 0.61–1.24)
GFR calc Af Amer: >60 mL/min (ref >60)
GFR calc non Af Amer: >60 mL/min (ref >60)
GLUCOSE: 91 mg/dL (ref 65–99)
POTASSIUM: 4.1 mmol/L (ref 3.5–5.1)
Sodium: 137 mmol/L (ref 135–145)

## 2017-01-28 LAB — GLUCOSE, CAPILLARY: Glucose-Capillary: 109 mg/dL — ABNORMAL HIGH (ref 65–99)

## 2017-01-28 NOTE — Care Management Note (Signed)
Case Management Note  Patient Details  Name: [name] MRN: [id number] Date of Birth: [birth year]  Subjective/Objective:                    Action/Plan:   Expected Discharge Date:   (unknown)               Expected Discharge Plan:  Skilled Nursing Facility  In-House Referral:  Clinical Social Work  Discharge planning Services     Post Acute Care Choice:    Choice offered to:     DME Arranged:    DME Agency:     HH Arranged:    HH Agency:     Status of Service:  In process, will continue to follow  If discussed at Long Length of Stay Meetings, dates discussed:    Additional Comments:  Kingsley Plan, RN 01/28/2017, 2:04 PM

## 2017-01-28 NOTE — Progress Notes (Signed)
PROGRESS NOTE                                                                                                                                                                                                             Patient Demographics:    [name], is a 51 y.o. male, DOB - 06-Nov-1965, QIH:[id number]  Admit date - 01/25/2017   Admitting Physician Therisa Doyne, MD  Outpatient Primary MD for the patient is Comer, [name], MD  LOS - 3  Outpatient Specialists: Heart failure Dr. Luciana Axe  Chief Complaint  Patient presents with  . Fall  . Ankle Pain  . Hypotension       Brief Narrative   51 year old male with history of HIV (CD4 count of 540, on ART), hepatitis C,nonischemic cardiomyopathy status post [employer] CRT-D, GERD, hypertension with prior syncopal episodes who had a syncopal episode at home one week back and since then was unable to ambulate independently due to pain. He did not come to the ED initially since he could not take the steps in his house to get out. Patient denies sustaining any head injury, seizures, chest pain, shortness of breath, nausea, vomiting, fevers, chills, abdominal pain, bowel or urinary symptoms. Patient was seen by cardiology one month back for lightheadedness and was found to be orthostatic. Suspected to be dehydration with Lasix which was held along with his Aldactone , BB and Entresto. All his meds were resumed about 2 weeks back.  Patient presented to the ED where his blood pressure was low (92/69 mmHg) . remaining vitals were stable.. Pacemaker was interrogated and was ruled out for V. Tach. Patient was found to have acute kidney injury with creatinine of 3.19 (baseline 1.2). X-ray showed fracture of the first metacarpal head, trimalleolar fracture of the right ankle and fracture of left proximal tibia,.  Admitted to hospitalist service on telemetry. Patient taken to OR today  and underwent ORIF of rt trimalleolar ankle fracture.    Subjective:   Patient feels okay,pain is controlled, creatinine back to normal.   Assessment  & Plan :    Principal Problem:   Closed right ankle fracture secondary to syncope. Underwent ORIF. Pain control with Vicodin and low-dose morphine. -orthopedic swallowing, PT recommended SNF placement. -CSW consulted for placement.expected discharge to SNF in a.m.  Active Problems: Orthostatic/vasovagal syncope All blood  pressure/heart failure medications have been held. hypertension with dehydration and acute kidney injury.receive gentle hydration. This is likely secondary to hypovolemia from heart failure medications.  Acute kidney injury Secondary to dehydration and diuretics. All blood pressure medications and diuretic held. Avoid NSAIDs. Presented with creatinine of 3.19, creatinine is 1.15.  Chronic systolic CHF Advanced heart failure team following, patient LVEF improved to 65-70% with grade 1 diastolic dysfunction. He was on Entresto, Coreg and diuretics. Medications adjusted.  Chronic hepatitis C LFTs stable.    Human immunodeficiency virus (HIV) disease (HCC) CD4 of 540. Continue ART.  Chronic alcohol use Reports drinking 3 beers a week. Currently on CIWA.    Closed fracture of base of first metacarpal of left hand Orthopedics recommend non operative management and maintain with a thumb spica.Nonweightbearing on left hand.    Closed fracture of left tibial plateau Non operative management. Nonweightbearing. Knee immobilizer and elevate leg /apply ice.      Code Status : full code  Family Communication  : none at bedside  Disposition Plan  : pending hospital course, PT evaluation  Barriers For Discharge : Postop  Consults  :   Dr Eulah Pont HF team  Procedures  :  CT left knee ORIF Rt ankle fracture   DVT Prophylaxis  :  Lovenox -  Lab Results  Component Value Date   PLT 138 (L) 01/26/2017     Antibiotics  :    Anti-infectives    Start     Dose/Rate Route Frequency Ordered Stop   01/26/17 1800  ceFAZolin (ANCEF) IVPB 1 g/50 mL premix     1 g 100 mL/hr over 30 Minutes Intravenous Every 6 hours 01/26/17 1517 01/27/17 0633   01/26/17 1215  ceFAZolin (ANCEF) IVPB 2g/100 mL premix     2 g 200 mL/hr over 30 Minutes Intravenous To Surgery 01/26/17 1207 01/26/17 1225   01/26/17 1000  dolutegravir (TIVICAY) tablet 50 mg     50 mg Oral Daily 01/25/17 2250     01/26/17 0900  emtricitabine-rilpivir-tenofovir AF (ODEFSEY) 200-25-25 MG per tablet 1 tablet     1 tablet Oral Daily with breakfast 01/25/17 2250          Objective:   Vitals:   01/27/17 1453 01/27/17 2100 01/28/17 0526 01/28/17 1437  BP: 112/76 131/87 126/77 124/79  Pulse: 73 76 75 74  Resp: 18 17 18    Temp: 98.2 F (36.8 C) 98.8 F (37.1 C) 98.2 F (36.8 C) 98.6 F (37 C)  TempSrc: Oral Oral  Oral  SpO2: 98% 98% 95% 98%  Weight:   74 kg (163 lb 2.3 oz)   Height:        Wt Readings from Last 3 Encounters:  01/28/17 74 kg (163 lb 2.3 oz)  12/24/16 73 kg (161 lb)  12/22/16 73 kg (161 lb)     Intake/Output Summary (Last 24 hours) at 01/28/17 1515 Last data filed at 01/28/17 0553  Gross per 24 hour  Intake              940 ml  Output              550 ml  Net              390 ml     Physical Exam  Gen: not in distress HEENT: moist mucosa, supple neck Chest: clear b/l, no added sounds CVS: N S1&S2, no murmurs, rubs or gallop GI: soft, NT, ND, BS+ Musculoskeletal: warm, dressing over rt  ankle, left knee immobilizer, ace wrap over left hand CNS: alert and oreinted    Data Review:    CBC  Recent Labs Lab 01/25/17 1655 01/26/17 0613  WBC 9.5 8.7  HGB 13.7 12.2*  HCT 41.5 39.1  PLT 153 138*  MCV 94.1 95.4  MCH 31.1 29.8  MCHC 33.0 31.2  RDW 15.2 15.1  LYMPHSABS 3.2 3.3  MONOABS 0.6 0.6  EOSABS 0.2 0.2  BASOSABS 0.1 0.0    Chemistries   Recent Labs Lab 01/25/17 1655  01/26/17 0613 01/27/17 0514 01/28/17 0331  NA 140 138 139 137  K 4.6 4.5 4.6 4.1  CL 108 110 110 110  CO2 20* 19* 20* 21*  GLUCOSE 103* 95 92 91  BUN 39* 36* 16 10  CREATININE 3.19* 2.36* 1.31* 1.15  CALCIUM 9.3 8.9 8.8* 8.8*  MG  --  2.0  --   --   AST 93* 84*  --   --   ALT 68* 56  --   --   ALKPHOS 148* 109  --   --   BILITOT 1.9* 1.7*  --   --    ------------------------------------------------------------------------------------------------------------------ No results for input(s): CHOL, HDL, LDLCALC, TRIG, CHOLHDL, LDLDIRECT in the last 72 hours.  Lab Results  Component Value Date   HGBA1C 4.4 (L) 01/26/2017   ------------------------------------------------------------------------------------------------------------------ No results for input(s): TSH, T4TOTAL, T3FREE, THYROIDAB in the last 72 hours.  Invalid input(s): FREET3 ------------------------------------------------------------------------------------------------------------------ No results for input(s): VITAMINB12, FOLATE, FERRITIN, TIBC, IRON, RETICCTPCT in the last 72 hours.  Coagulation profile No results for input(s): INR, PROTIME in the last 168 hours.  No results for input(s): DDIMER in the last 72 hours.  Cardiac Enzymes  Recent Labs Lab 01/25/17 2315 01/26/17 0613  TROPONINI <0.03 <0.03   ------------------------------------------------------------------------------------------------------------------    Component Value Date/Time   BNP 49.5 12/18/2016 1144    Inpatient Medications  Scheduled Meds: . amiodarone  200 mg Oral Daily  . carvedilol  3.125 mg Oral BID WC  . Chlorhexidine Gluconate Cloth  6 each Topical Daily  . clobetasol ointment  1 application Topical BID  . dolutegravir  50 mg Oral Daily  . emtricitabine-rilpivir-tenofovir AF  1 tablet Oral Q breakfast  . enoxaparin (LOVENOX) injection  40 mg Subcutaneous Q24H  . folic acid  1 mg Oral Daily  . LORazepam  1 mg Oral QHS   . multivitamin with minerals  1 tablet Oral Daily  . mupirocin ointment  1 application Nasal BID  . senna  1 tablet Oral BID  . sodium chloride flush  3 mL Intravenous Q12H  . spironolactone  12.5 mg Oral QHS  . thiamine  100 mg Oral Daily   Or  . thiamine  100 mg Intravenous Daily  . triamcinolone cream  1 application Topical TID   Continuous Infusions: . lactated ringers 75 mL/hr at 01/27/17 0602  . methocarbamol (ROBAXIN)  IV     PRN Meds:.acetaminophen **OR** acetaminophen, hydrOXYzine, LORazepam **OR** LORazepam, methocarbamol **OR** methocarbamol (ROBAXIN)  IV, mirtazapine, morphine injection, ondansetron (ZOFRAN) IV, oxyCODONE-acetaminophen  Micro Results Recent Results (from the past 240 hour(s))  Surgical pcr screen     Status: Abnormal   Collection Time: 01/25/17 11:12 PM  Result Value Ref Range Status   MRSA, PCR NEGATIVE NEGATIVE Final   Staphylococcus aureus POSITIVE (A) NEGATIVE Final    Comment:        The Xpert SA Assay (FDA approved for NASAL specimens in patients over 81 years of age), is one  component of a comprehensive surveillance program.  Test performance has been validated by [facility] for patients greater than or equal to 25 year old. It is not intended to diagnose infection nor to guide or monitor treatment.     Radiology Reports Dg Chest 2 View  Result Date: 01/25/2017 CLINICAL DATA:  Fall.  Chest pain. EXAM: CHEST  2 VIEW COMPARISON:  01/27/2014.  02/12/2013.  CT 01/20/2013.  CT 01/20/2012 FINDINGS: AICD in stable position. Heart size normal. Stable left base pleuroparenchymal thickening consistent with scarring. Follow-up chest x-ray recommended to exclude an underlying focal lesion/mass. No pleural effusion or pneumothorax. No evidence of displaced rib fracture. IMPRESSION: 1. Stable left base pleuroparenchymal thickening consistent with scarring. No acute abnormality identified . 2. AICD in stable position . Electronically Signed   By:  Maisie Fus  Register   On: 01/25/2017 16:33   Dg Ankle Complete Right  Result Date: 01/25/2017 CLINICAL DATA:  Continued acute right ankle pain following fall 1 week ago. Initial encounter. EXAM: RIGHT ANKLE - COMPLETE 3+ VIEW COMPARISON:  None. FINDINGS: An oblique fracture of the distal fibula (4 mm lateral/ posterior displacement), a comminuted fracture the medial malleolus, and a nondisplaced fracture of the posterior tibial malleolus noted. A 1.5 cm bony fragment anterior to the joint on the lateral view is noted, likely displaced from the distal tibia. There is no evidence of subluxation or dislocation. IMPRESSION: Trimalleolar fractures as described. 1.5 cm bony fragment anterior to the ankle joint on the lateral view likely displaced from the distal tibia. Electronically Signed   By: Harmon Pier M.D.   On: 01/25/2017 16:31   Ct Knee Left Wo Contrast  Result Date: 01/26/2017 CLINICAL DATA:  Proximal tibia fracture secondary to a fall yesterday. EXAM: CT OF THE LEFT KNEE WITHOUT CONTRAST TECHNIQUE: Multidetector CT imaging of the left knee was performed according to the standard protocol. Multiplanar CT image reconstructions were also generated. COMPARISON:  Radiographs dated 01/25/2017 FINDINGS: Bones/Joint/Cartilage There is an avulsion fracture of the posterior aspect of the medial tibial plateau extending across the midline involving the tibial insertion of the posterior cruciate ligament. There is minimal distraction of approximately 3 mm. There is no depression of fracture fragments. No other fractures.  Small joint effusion. Ligaments Suboptimally assessed by CT. ACL appears to be intact. Prominent soft tissue swelling adjacent to the MCL suggesting the possibility of a MCL sprain. LCL appears normal. Muscles and Tendons Negative. Soft tissues Negative. IMPRESSION: Large avulsion fracture involving the posterior aspect of the medial tibial plateau as well as the tibial attachment of the posterior  cruciate ligament. Electronically Signed   By: Francene Boyers M.D.   On: 01/26/2017 10:19   Dg Knee Complete 4 Views Left  Result Date: 01/25/2017 CLINICAL DATA:  Patient with diffuse knee pain status post fall. EXAM: LEFT KNEE - COMPLETE 4+ VIEW COMPARISON:  Left knee radiograph 11/23/2012. FINDINGS: Distal femur is unremarkable. There is a mildly distracted oblique fracture through the mid aspect of the proximal tibia extending to the articular surface near the tibial spines. Proximal fibula is unremarkable. Regional soft tissues are unremarkable. IMPRESSION: Fracture of the proximal tibia extending to the articular surface. Electronically Signed   By: Annia Belt M.D.   On: 01/25/2017 16:34   Dg Hand Complete Left  Result Date: 01/25/2017 CLINICAL DATA:  Left hand pain secondary to a fall one week ago. EXAM: LEFT HAND - COMPLETE 3+ VIEW COMPARISON:  None. FINDINGS: There is a slightly displaced the hand  angulated fracture of the base of the first metacarpal. No dislocation.  The other bones appear normal. IMPRESSION: Fracture of the base of the proximal first metacarpal with slight angulation and displacement. Electronically Signed   By: Francene Boyers M.D.   On: 01/25/2017 16:40   Dg Finger Thumb Left  Result Date: 01/25/2017 CLINICAL DATA:  Acute left thumb pain following fall 1 week ago. Initial encounter. EXAM: LEFT THUMB 2+V COMPARISON:  None. FINDINGS: A fracture of the proximal first metacarpal is identified and does not appear to extend to the carpometacarpal joint. Mild apex dorsal angulation is noted. No subluxation or dislocation. IMPRESSION: Proximal first metacarpal fracture. Electronically Signed   By: Harmon Pier M.D.   On: 01/25/2017 16:33    Time Spent in minutes 35   Clint Lipps M.D on 01/28/2017 at 3:15 PM  Between 7am to 7pm - Pager - [phone]  After 7pm go to [email or web address] - password [facility]  [facility] -  Office  [phone]

## 2017-01-28 NOTE — Progress Notes (Signed)
Advanced Heart Failure Rounding Note  Primary Cardiologist: Dr. [name]   Subjective:    S/p ORIF of R ankle fracture 01/26/17  Feeling better today. Remains occasional lightheadedness at times. Ankle pain intermittently controlled.   Creatinine stable. SBP stable in 110-120s  Echo 01/26/17 LVEF 65-70%, Grade 1 DD  Objective:   Weight Range: 163 lb 2.3 oz (74 kg) Body mass index is 28 kg/m.   Vital Signs:   Temp:  [98.2 F (36.8 C)-98.8 F (37.1 C)] 98.2 F (36.8 C) (08/30 0526) Pulse Rate:  [73-76] 75 (08/30 0526) Resp:  [17-18] 18 (08/30 0526) BP: (112-131)/(76-87) 126/77 (08/30 0526) SpO2:  [95 %-98 %] 95 % (08/30 0526) Weight:  [163 lb 2.3 oz (74 kg)] 163 lb 2.3 oz (74 kg) (08/30 0526) Last BM Date: 01/25/17  Weight change: Filed Weights   01/25/17 2245 01/27/17 0500 01/28/17 0526  Weight: 162 lb (73.5 kg) 165 lb 3.2 oz (74.9 kg) 163 lb 2.3 oz (74 kg)    Intake/Output:   Intake/Output Summary (Last 24 hours) at 01/28/17 1209 Last data filed at 01/28/17 0553  Gross per 24 hour  Intake              940 ml  Output              650 ml  Net              290 ml      Physical Exam    General: Fatigued appearing. No resp difficulty. HEENT: Normal Neck: Supple. JVP 5-6. Carotids 2+ bilat; no bruits. No thyromegaly or nodule noted. Cor: PMI nondisplaced. RRR, No M/G/R noted Lungs: CTAB, normal effort. Abdomen: Soft, non-tender, non-distended, no HSM. No bruits or masses. +BS  Extremities: No cyanosis, clubbing, rash, or edema. RLE with wraps C/D/I. LLE in brace.   Neuro: Alert & orientedx3, cranial nerves grossly intact. moves all 4 extremities w/o difficulty. Affect pleasant   Telemetry   Not connected.   EKG    N/A  Labs    CBC  Recent Labs  01/25/17 1655 01/26/17 0613  WBC 9.5 8.7  NEUTROABS 5.5 4.5  HGB 13.7 12.2*  HCT 41.5 39.1  MCV 94.1 95.4  PLT 153 138*   Basic Metabolic Panel  Recent Labs  01/26/17 0613 01/27/17 0514  01/28/17 0331  NA 138 139 137  K 4.5 4.6 4.1  CL 110 110 110  CO2 19* 20* 21*  GLUCOSE 95 92 91  BUN 36* 16 10  CREATININE 2.36* 1.31* 1.15  CALCIUM 8.9 8.8* 8.8*  MG 2.0  --   --   PHOS 5.0*  --   --    Liver Function Tests  Recent Labs  01/25/17 1655 01/26/17 0613  AST 93* 84*  ALT 68* 56  ALKPHOS 148* 109  BILITOT 1.9* 1.7*  PROT 8.0 6.6  ALBUMIN 3.4* 2.9*   No results for input(s): LIPASE, AMYLASE in the last 72 hours. Cardiac Enzymes  Recent Labs  01/25/17 2315 01/26/17 0613  TROPONINI <0.03 <0.03    BNP: BNP (last 3 results)  Recent Labs  12/18/16 1144  BNP 49.5    ProBNP (last 3 results) No results for input(s): PROBNP in the last 8760 hours.   D-Dimer No results for input(s): DDIMER in the last 72 hours. Hemoglobin A1C  Recent Labs  01/26/17 0613  HGBA1C 4.4*   Fasting Lipid Panel No results for input(s): CHOL, HDL, LDLCALC, TRIG, CHOLHDL, LDLDIRECT in the last  72 hours. Thyroid Function Tests No results for input(s): TSH, T4TOTAL, T3FREE, THYROIDAB in the last 72 hours.  Invalid input(s): FREET3  Other results:   Imaging    No results found.   Medications:     Scheduled Medications: . amiodarone  200 mg Oral Daily  . carvedilol  3.125 mg Oral BID WC  . Chlorhexidine Gluconate Cloth  6 each Topical Daily  . clobetasol ointment  1 application Topical BID  . dolutegravir  50 mg Oral Daily  . emtricitabine-rilpivir-tenofovir AF  1 tablet Oral Q breakfast  . enoxaparin (LOVENOX) injection  40 mg Subcutaneous Q24H  . folic acid  1 mg Oral Daily  . LORazepam  1 mg Oral QHS  . multivitamin with minerals  1 tablet Oral Daily  . mupirocin ointment  1 application Nasal BID  . senna  1 tablet Oral BID  . sodium chloride flush  3 mL Intravenous Q12H  . spironolactone  12.5 mg Oral QHS  . thiamine  100 mg Oral Daily   Or  . thiamine  100 mg Intravenous Daily  . triamcinolone cream  1 application Topical TID    Infusions: .  lactated ringers 75 mL/hr at 01/27/17 0602  . methocarbamol (ROBAXIN)  IV      PRN Medications: acetaminophen **OR** acetaminophen, hydrOXYzine, LORazepam **OR** LORazepam, methocarbamol **OR** methocarbamol (ROBAXIN)  IV, mirtazapine, morphine injection, ondansetron (ZOFRAN) IV, oxyCODONE-acetaminophen    Patient Profile   [name] is a 51 y.o. male  with a history of HIV 1998, HCV, ETOH abuse and CHF due to NICM s/p Med[employer] CRT-D (06/2013). He also has a h/o syncope and previously had an EP study which was non-inducible for VT.  Admitted for close R ankle, L knee, and L wrist fracture in setting of syncope and collapse. ICD interrogation negative for VT.   Assessment/Plan   1. Syncope and collapse - Likely vasovagal with hypotension and AKI - Resuming HF meds as tolerated. Creatinine and BP improved.   2. Chronic systolic CHF - Echo 01/26/17 LVEF 65-70%, Grade 1 DD - Would continue to hold Entresto. He is insistent that he wants to be tried back on this. Would use either lowest dose or follow tolerance of Losartan.  No med change today.  - Continue low dose spiro 12.5 mg qhs tonight.  - Continue coreg  3.125 mg BID.   3. Hepatitis C - LFTs stable. Per primary.   4. HIV - Per primary.    5. H/o VT - No VT noted on ICD interrogation. Continue amiodarone. No change.   6. 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   Length of Stay: 3   [name]  01/28/2017, 12:09 PM  Advanced Heart Failure Team Pager [phone] (M-F; 7a - 4p)  Please contact [facility] for night-coverage after hours (4p -7a ) and weekends on [email or web address]  Patient seen and examined with the above-signed Advanced Practice Provider and/or Housestaff. I personally reviewed laboratory data, imaging studies and relevant notes. I independently examined the patient and formulated the important aspects of the plan. I have edited the note to reflect any of my changes or  salient points. I have personally discussed the plan with the patient and/or family.  Improving on current regimen. Creatinine now stable. BP remains soft. Would continue to hold Entresto. He is insistent that he would like to retry. We can consider as outpatient.  [name], MD  1:04 PM

## 2017-01-29 DIAGNOSIS — M25562 Pain in left knee: Secondary | ICD-10-CM | POA: Diagnosis not present

## 2017-01-29 DIAGNOSIS — S92909A Unspecified fracture of unspecified foot, initial encounter for closed fracture: Secondary | ICD-10-CM | POA: Diagnosis not present

## 2017-01-29 DIAGNOSIS — I5022 Chronic systolic (congestive) heart failure: Secondary | ICD-10-CM | POA: Diagnosis not present

## 2017-01-29 DIAGNOSIS — F1010 Alcohol abuse, uncomplicated: Secondary | ICD-10-CM | POA: Diagnosis not present

## 2017-01-29 DIAGNOSIS — B1920 Unspecified viral hepatitis C without hepatic coma: Secondary | ICD-10-CM | POA: Diagnosis not present

## 2017-01-29 DIAGNOSIS — S82891D Other fracture of right lower leg, subsequent encounter for closed fracture with routine healing: Secondary | ICD-10-CM | POA: Diagnosis not present

## 2017-01-29 DIAGNOSIS — B182 Chronic viral hepatitis C: Secondary | ICD-10-CM | POA: Diagnosis not present

## 2017-01-29 DIAGNOSIS — M25571 Pain in right ankle and joints of right foot: Secondary | ICD-10-CM | POA: Diagnosis not present

## 2017-01-29 DIAGNOSIS — Z21 Asymptomatic human immunodeficiency virus [HIV] infection status: Secondary | ICD-10-CM | POA: Diagnosis not present

## 2017-01-29 DIAGNOSIS — B20 Human immunodeficiency virus [HIV] disease: Secondary | ICD-10-CM | POA: Diagnosis not present

## 2017-01-29 DIAGNOSIS — N179 Acute kidney failure, unspecified: Secondary | ICD-10-CM | POA: Diagnosis not present

## 2017-01-29 DIAGNOSIS — I472 Ventricular tachycardia: Secondary | ICD-10-CM | POA: Diagnosis not present

## 2017-01-29 DIAGNOSIS — I951 Orthostatic hypotension: Secondary | ICD-10-CM | POA: Diagnosis not present

## 2017-01-29 DIAGNOSIS — G8911 Acute pain due to trauma: Secondary | ICD-10-CM | POA: Diagnosis not present

## 2017-01-29 DIAGNOSIS — S62202D Unspecified fracture of first metacarpal bone, left hand, subsequent encounter for fracture with routine healing: Secondary | ICD-10-CM | POA: Diagnosis not present

## 2017-01-29 DIAGNOSIS — Z7982 Long term (current) use of aspirin: Secondary | ICD-10-CM | POA: Diagnosis not present

## 2017-01-29 DIAGNOSIS — S62235A Other nondisplaced fracture of base of first metacarpal bone, left hand, initial encounter for closed fracture: Secondary | ICD-10-CM | POA: Diagnosis not present

## 2017-01-29 DIAGNOSIS — M79642 Pain in left hand: Secondary | ICD-10-CM | POA: Diagnosis not present

## 2017-01-29 DIAGNOSIS — N170 Acute kidney failure with tubular necrosis: Secondary | ICD-10-CM | POA: Diagnosis not present

## 2017-01-29 DIAGNOSIS — Z95810 Presence of automatic (implantable) cardiac defibrillator: Secondary | ICD-10-CM | POA: Diagnosis not present

## 2017-01-29 DIAGNOSIS — S82851D Displaced trimalleolar fracture of right lower leg, subsequent encounter for closed fracture with routine healing: Secondary | ICD-10-CM | POA: Diagnosis not present

## 2017-01-29 DIAGNOSIS — S82851S Displaced trimalleolar fracture of right lower leg, sequela: Secondary | ICD-10-CM | POA: Diagnosis not present

## 2017-01-29 DIAGNOSIS — S82142S Displaced bicondylar fracture of left tibia, sequela: Secondary | ICD-10-CM | POA: Diagnosis not present

## 2017-01-29 DIAGNOSIS — S82145D Nondisplaced bicondylar fracture of left tibia, subsequent encounter for closed fracture with routine healing: Secondary | ICD-10-CM | POA: Diagnosis not present

## 2017-01-29 DIAGNOSIS — R2689 Other abnormalities of gait and mobility: Secondary | ICD-10-CM | POA: Diagnosis not present

## 2017-01-29 DIAGNOSIS — M6281 Muscle weakness (generalized): Secondary | ICD-10-CM | POA: Diagnosis not present

## 2017-01-29 DIAGNOSIS — I10 Essential (primary) hypertension: Secondary | ICD-10-CM | POA: Diagnosis not present

## 2017-01-29 DIAGNOSIS — I110 Hypertensive heart disease with heart failure: Secondary | ICD-10-CM | POA: Diagnosis not present

## 2017-01-29 DIAGNOSIS — Z4789 Encounter for other orthopedic aftercare: Secondary | ICD-10-CM | POA: Diagnosis not present

## 2017-01-29 DIAGNOSIS — Z79899 Other long term (current) drug therapy: Secondary | ICD-10-CM | POA: Diagnosis not present

## 2017-01-29 DIAGNOSIS — I429 Cardiomyopathy, unspecified: Secondary | ICD-10-CM | POA: Diagnosis not present

## 2017-01-29 DIAGNOSIS — R41841 Cognitive communication deficit: Secondary | ICD-10-CM | POA: Diagnosis not present

## 2017-01-29 DIAGNOSIS — Z5189 Encounter for other specified aftercare: Secondary | ICD-10-CM | POA: Diagnosis not present

## 2017-01-29 DIAGNOSIS — S62232S Other displaced fracture of base of first metacarpal bone, left hand, sequela: Secondary | ICD-10-CM | POA: Diagnosis not present

## 2017-01-29 LAB — BASIC METABOLIC PANEL
ANION GAP: 6 (ref 5–15)
BUN: 8 mg/dL (ref 6–20)
CO2: 22 mmol/L (ref 22–32)
Calcium: 8.8 mg/dL — ABNORMAL LOW (ref 8.9–10.3)
Chloride: 111 mmol/L (ref 101–111)
Creatinine, Ser: 1.09 mg/dL (ref 0.61–1.24)
Glucose, Bld: 106 mg/dL — ABNORMAL HIGH (ref 65–99)
POTASSIUM: 4 mmol/L (ref 3.5–5.1)
SODIUM: 139 mmol/L (ref 135–145)

## 2017-01-29 LAB — GLUCOSE, CAPILLARY: Glucose-Capillary: 90 mg/dL (ref 65–99)

## 2017-01-29 MED ORDER — SPIRONOLACTONE 25 MG PO TABS: 12.5000 mg | ORAL_TABLET | Freq: Every day | ORAL | 0 refills | Status: DC

## 2017-01-29 MED ORDER — CARVEDILOL 3.125 MG PO TABS: 3.1250 mg | ORAL_TABLET | Freq: Two times a day (BID) | ORAL | 0 refills | Status: DC

## 2017-01-29 NOTE — Care Management Important Message (Signed)
Important Message  Patient Details  Name: [name] MRN: [id number] Date of Birth: 03-02-1966   Medicare Important Message Given:  Yes    Kyla Balzarine 01/29/2017, 9:42 AM

## 2017-01-29 NOTE — Progress Notes (Addendum)
Discharge pt to SNF [facility]), discharge instructions was given. Pt was transported via [facility]. Attempted to give report to [facility] place x2, no answer, left a number to the operator.

## 2017-01-29 NOTE — Progress Notes (Signed)
Advanced Heart Failure Rounding Note  Primary Cardiologist: Dr. [name]   Subjective:    S/p ORIF of R ankle fracture 01/26/17  Doing ok. No SOB. BP stable.   Creatinine 1.09. Going to SNF today  Echo 01/26/17 LVEF 65-70%, Grade 1 DD  Objective:   Weight Range: 80.7 kg (178 lb) Body mass index is 30.55 kg/m.   Vital Signs:   Temp:  [97.5 F (36.4 C)-98.8 F (37.1 C)] 98.8 F (37.1 C) (08/31 0500) Pulse Rate:  [72-78] 72 (08/31 0500) Resp:  [18] 18 (08/30 2152) BP: (114-124)/(79-82) 123/81 (08/31 0500) SpO2:  [95 %-99 %] 95 % (08/31 0500) Weight:  [80.7 kg (178 lb)] 80.7 kg (178 lb) (08/31 0500) Last BM Date: 01/28/17  Weight change: Filed Weights   01/27/17 0500 01/28/17 0526 01/29/17 0500  Weight: 74.9 kg (165 lb 3.2 oz) 74 kg (163 lb 2.3 oz) 80.7 kg (178 lb)    Intake/Output:   Intake/Output Summary (Last 24 hours) at 01/29/17 0720 Last data filed at 01/29/17 1610  Gross per 24 hour  Intake                0 ml  Output              650 ml  Net             -650 ml      Physical Exam    General: lying in bed . No resp difficulty. HEENT: Normal Neck: Supple. JVP 6-7. Carotids 2+ bilat; no bruits. No thyromegaly or nodule noted. Cor: PMI nondisplaced. RRR Lungs: clear  Abdomen: Soft, NT ND no HSM. No bruits or masses. +BS  Extremities: No cyanosis, clubbing, rash, or edema. RLE with cast C/D/I. LLE in brace.   LUE in cast Neuro: Alert & orientedx3, cranial nerves grossly intact. moves all 4 extremities w/o difficulty. Affect pleasant   Telemetry   NSR 50-60s  Personally reviewed   EKG    N/A  Labs    CBC No results for input(s): WBC, NEUTROABS, HGB, HCT, MCV, PLT in the last 72 hours. Basic Metabolic Panel  Recent Labs  01/28/17 0331 01/29/17 0510  NA 137 139  K 4.1 4.0  CL 110 111  CO2 21* 22  GLUCOSE 91 106*  BUN 10 8  CREATININE 1.15 1.09  CALCIUM 8.8* 8.8*   Liver Function Tests No results for input(s): AST, ALT,  ALKPHOS, BILITOT, PROT, ALBUMIN in the last 72 hours. No results for input(s): LIPASE, AMYLASE in the last 72 hours. Cardiac Enzymes No results for input(s): CKTOTAL, CKMB, CKMBINDEX, TROPONINI in the last 72 hours.  BNP: BNP (last 3 results)  Recent Labs  12/18/16 1144  BNP 49.5    ProBNP (last 3 results) No results for input(s): PROBNP in the last 8760 hours.   D-Dimer No results for input(s): DDIMER in the last 72 hours. Hemoglobin A1C No results for input(s): HGBA1C in the last 72 hours. Fasting Lipid Panel No results for input(s): CHOL, HDL, LDLCALC, TRIG, CHOLHDL, LDLDIRECT in the last 72 hours. Thyroid Function Tests No results for input(s): TSH, T4TOTAL, T3FREE, THYROIDAB in the last 72 hours.  Invalid input(s): FREET3  Other results:   Imaging    No results found.   Medications:     Scheduled Medications: . amiodarone  200 mg Oral Daily  . carvedilol  3.125 mg Oral BID WC  . Chlorhexidine Gluconate Cloth  6 each Topical Daily  . clobetasol ointment  1  application Topical BID  . dolutegravir  50 mg Oral Daily  . emtricitabine-rilpivir-tenofovir AF  1 tablet Oral Q breakfast  . enoxaparin (LOVENOX) injection  40 mg Subcutaneous Q24H  . folic acid  1 mg Oral Daily  . LORazepam  1 mg Oral QHS  . multivitamin with minerals  1 tablet Oral Daily  . mupirocin ointment  1 application Nasal BID  . senna  1 tablet Oral BID  . sodium chloride flush  3 mL Intravenous Q12H  . spironolactone  12.5 mg Oral QHS  . thiamine  100 mg Oral Daily   Or  . thiamine  100 mg Intravenous Daily  . triamcinolone cream  1 application Topical TID    Infusions: . methocarbamol (ROBAXIN)  IV      PRN Medications: acetaminophen **OR** acetaminophen, hydrOXYzine, methocarbamol **OR** methocarbamol (ROBAXIN)  IV, mirtazapine, morphine injection, ondansetron (ZOFRAN) IV, oxyCODONE-acetaminophen    Patient Profile   [name] is a 51 y.o. male  with a history of HIV  1998, HCV, ETOH abuse and CHF due to NICM s/p [employer] CRT-D (06/2013). He also has a h/o syncope and previously had an EP study which was non-inducible for VT.  Admitted for close R ankle, L knee, and L wrist fracture in setting of syncope and collapse. ICD interrogation negative for VT.   Assessment/Plan   1. Syncope and collapse - Likely vasovagal with hypotension and AKI - BP and renal function improved. No arrhythmias on tele  2. Chronic systolic CHF - Echo 01/26/17 LVEF 65-70%, Grade 1 DD - Would continue to hold Entresto - Continue low dose spiro 12.5 mg qhs tonight.  - Continue coreg  3.125 mg BID.   3. Hepatitis C - LFTs stable. Per primary.   4. HIV - Per primary.    5. H/o VT - No VT or other dysrhythmias  noted on ICD interrogation. Continue amiodarone. No change.   6. 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   Ok for SNF today on above meds. Will f/u in HF Clinic. We will sign off.   Length of Stay: 4   [name], MD  01/29/2017, 7:20 AM  Advanced Heart Failure Team Pager [phone] (M-F; 7a - 4p)  Please contact [facility] for night-coverage after hours (4p -7a ) and weekends on [email or web address]

## 2017-01-29 NOTE — Clinical Social Work Note (Signed)
Pt has chosen to take bed at [facility]. CSW will set up transport--pending d/c summary.  [place], [place] [phone]

## 2017-01-29 NOTE — Progress Notes (Signed)
Physical Therapy Treatment Patient Details Name: [name] MRN: [id number] DOB: 22-Jul-1965 Today's Date: 01/29/2017    History of Present Illness Pt is a 51 y.o. male who presented to the ED with inability to ambulate after a syncope episode resulting in a fall one week prior to presentation. Pt found to have fracutre of base of first metacarpal of L hand, closed trimalleolar ankle fracture s/p ORIF 8/28, and closed L tibial plateau fracture. PMH signficant for CHF, chronic systolic heart failure, depression, G6PD deficiency, GERD< hepatitis, HIV, hypertension, nonischemic cardiomyopathy. Pt with pacemaker.     PT Comments    Pt able to mobilize in bed with supervision in preporation for A/P scoot to chair. Light mod A+2 to transfer to recliner and cueing required to maintain NWB on L UE. Expected to d/c this PM to SNF. He would benefit from continued skilled PT to increase functional independence.    Follow Up Recommendations  SNF;Supervision/Assistance - 24 hour     Equipment Recommendations  Other (comment) (defer to next venue)    Recommendations for Other Services       Precautions / Restrictions Precautions Precautions: Fall Required Braces or Orthoses: Knee Immobilizer - Left Knee Immobilizer - Left: On at all times Restrictions Weight Bearing Restrictions: Yes LUE Weight Bearing: Weight bear through elbow only RLE Weight Bearing: Non weight bearing LLE Weight Bearing: Non weight bearing    Mobility  Bed Mobility Overal bed mobility: Needs Assistance             General bed mobility comments: pt able to achieve long sitting position with supervision in preparation for Anterior/posterior scoot  Transfers Overall transfer level: Needs assistance Equipment used: None Transfers: Licensed conveyancer transfers: Mod assist;+2 physical assistance   General transfer comment: Light mod A to progress hips posteriorly off bed  and into chair. Use of pad under hips to facilitate movement. VC to maintain NWB on LUE.  Ambulation/Gait                 Stairs            Wheelchair Mobility    Modified Rankin (Stroke Patients Only)       Balance Overall balance assessment: Needs assistance Sitting-balance support: Feet supported;Single extremity supported Sitting balance-Leahy Scale: Poor Sitting balance - Comments: Able to scoot posteriorly while maintaining balance with R UE and L elbow                                    Cognition Arousal/Alertness: Awake/alert Behavior During Therapy: WFL for tasks assessed/performed Overall Cognitive Status: Within Functional Limits for tasks assessed                                        Exercises      General Comments        Pertinent Vitals/Pain Pain Assessment: 0-10 Pain Score: 9  Pain Location: bilateral LEs Pain Descriptors / Indicators: Sore;Grimacing;Guarding;Moaning Pain Intervention(s): Monitored during session;Limited activity within patient's tolerance;Repositioned    Home Living                      Prior Function            PT Goals (current goals can now be found in  the care plan section) Acute Rehab PT Goals Patient Stated Goal: to have less pain PT Goal Formulation: With patient Time For Goal Achievement: 02/10/17 Potential to Achieve Goals: Fair Progress towards PT goals: Progressing toward goals    Frequency    Min 2X/week      PT Plan Current plan remains appropriate    Co-evaluation              AM-PAC PT "6 Clicks" Daily Activity  Outcome Measure  Difficulty turning over in bed (including adjusting bedclothes, sheets and blankets)?: A Lot Difficulty moving from lying on back to sitting on the side of the bed? : A Lot Difficulty sitting down on and standing up from a chair with arms (e.g., wheelchair, bedside commode, etc,.)?: Unable Help needed moving to  and from a bed to chair (including a wheelchair)?: Total Help needed walking in hospital room?: Total Help needed climbing 3-5 steps with a railing? : Total 6 Click Score: 8    End of Session Equipment Utilized During Treatment: Left knee immobilizer Activity Tolerance: Patient tolerated treatment well Patient left: with call bell/phone within reach;with family/visitor present;in chair Nurse Communication: Mobility status;Need for lift equipment;Weight bearing status PT Visit Diagnosis: Other abnormalities of gait and mobility (R26.89);Pain Pain - Right/Left:  (bilateral) Pain - part of body: Leg     Time: 4098-1191 PT Time Calculation (min) (ACUTE ONLY): 25 min  Charges:  $Therapeutic Activity: 23-37 mins                    G Codes:      Kallie Locks, [place] Pager [phone] Acute Rehab   Sheral Apley 01/29/2017, 3:19 PM

## 2017-01-29 NOTE — Discharge Summary (Addendum)
Physician Discharge Summary  [name] PXT:[id number] DOB: Apr 19, 1966 DOA: 01/25/2017  PCP: David Barefoot, MD  Admit date: 01/25/2017 Discharge date: 01/29/2017  Admitted From: Home Disposition: Home  Recommendations for Outpatient Follow-up:  1. Follow up with PCP in 1-2 weeks, Follow-up with orthopedics and Dr. [name] in 1-2 weeks. 2. Please obtain BMP/CBC in one week  Home Health: NA Equipment/Devices:NA  Discharge Condition: Stable CODE STATUS: Full Code Diet recommendation: Diet regular Room service appropriate? Yes; Fluid consistency: Thin Diet - low sodium heart healthy  Brief/Interim Summary: 51 year old male with history of HIV (CD4 count of 540, on ART), hepatitis C,nonischemic cardiomyopathy status post [employer] CRT-D, GERD, hypertension with prior syncopal episodes who had a syncopal episode at home one week back and since then was unable to ambulate independently due to pain. He did not come to the ED initially since he could not take the steps in his house to get out. Patient denies sustaining any head injury, seizures, chest pain, shortness of breath, nausea, vomiting, fevers, chills, abdominal pain, bowel or urinary symptoms. Patient was seen by cardiology one month back for lightheadedness and was found to be orthostatic. Suspected to be dehydration with Lasix which was held along with his Aldactone , BB and Entresto. All his meds were resumed about 2 weeks back.  Patient presented to the ED where his blood pressure was low (92/69 mmHg) . remaining vitals were stable.. Pacemaker was interrogated and was ruled out for V. Tach. Patient was found to have acute kidney injury with creatinine of 3.19 (baseline 1.2). X-ray showed fracture of the first metacarpal head, trimalleolar fracture of the right ankle and fracture of left proximal tibia,.  Admitted to hospitalist service on telemetry. Patient taken to OR today and underwent ORIF of rt trimalleolar ankle  fracture.   Discharge Diagnoses:  Principal Problem:   Closed right ankle fracture Active Problems:   Human immunodeficiency virus (HIV) disease (HCC)   Chronic hepatitis C without hepatic coma (HCC)   Essential hypertension   Alcohol abuse   Cardiomyopathy (HCC)   Chronic systolic heart failure (HCC)   PTSD (post-traumatic stress disorder)   Dehydration   Hypotension   Skin rash   Elevated liver enzymes   Closed fracture of base of first metacarpal of left hand   Closed fracture of left tibial plateau   Syncope   HIV (human immunodeficiency virus infection) (HCC)   ARF (acute renal failure) (HCC)   Closed fracture of left proximal tibia   Multiple fractures   Nonischemic cardiomyopathy (HCC)   Syncope and collapse     Closed right ankle fracture secondary to fall and syncope. Underwent ORIF. Pain control with Vicodin and low-dose morphine. -orthopedic swallowing, PT recommended SNF placement. -On discharge oxycodone for pain control. DVT prophylaxis with daily aspirin and early ambulation per orthopedics.  Syncopal episode This is likely secondary to hypovolemia from heart failure medications. -Patient presented with acute kidney injury and to hypovolemia likely secondary to heart failure medications. -Old blood pressure/mL. Medicines been on hold. -On discharge it's been adjusted per advanced heart failure team recommendation.  Acute kidney injury Secondary to dehydration and diuretics. All blood pressure medications and diuretic held. Avoid NSAIDs. Presented with creatinine of 3.19, creatinine is 1.15.  Chronic systolic CHF LVEF improved to 62-70% with grade 1 diastolic dysfunction (LVEF in May 2015 was 20-25%). He was on Entresto, Coreg and diuretics. Medications adjusted. Advanced heart failure team following Entresto held, Coreg dose decreased, Lasix held started on low-dose  of Aldactone.  Chronic hepatitis C LFTs stable.    Human immunodeficiency virus  (HIV) disease (HCC) CD4 of 540. Continue ART.  Chronic alcohol use Reports drinking 3 beers a week. Currently on CIWA.    Closed fracture of base of first metacarpal of left hand Orthopedics recommend non operative management and maintain with a thumb spica.Nonweightbearing on left hand.    Closed fracture of left tibial plateau Non operative management. Nonweightbearing. Knee immobilizer and elevate leg /apply ice.   Discharge Instructions  Discharge Instructions    Diet - low sodium heart healthy    Complete by:  As directed    Increase activity slowly    Complete by:  As directed      Allergies as of 01/29/2017      Reactions   Bactrim Rash      Medication List    STOP taking these medications   ENTRESTO 97-103 MG Generic drug:  sacubitril-valsartan   furosemide 40 MG tablet Commonly known as:  LASIX   ibuprofen 200 MG tablet Commonly known as:  ADVIL,MOTRIN   LORazepam 1 MG tablet Commonly known as:  ATIVAN   potassium chloride SA 20 MEQ tablet Commonly known as:  K-DUR,KLOR-CON   sacubitril-valsartan 49-51 MG Commonly known as:  ENTRESTO     TAKE these medications   amiodarone 200 MG tablet Commonly known as:  PACERONE Take 1 tablet (200 mg total) by mouth daily.   aspirin EC 325 MG tablet Take 1 tablet (325 mg total) by mouth daily. For 30 days post op for DVT Prophylaxis   carvedilol 3.125 MG tablet Commonly known as:  COREG Take 1 tablet (3.125 mg total) by mouth 2 (two) times daily with a meal. What changed:  medication strength  how much to take   clobetasol ointment 0.05 % Commonly known as:  TEMOVATE Apply 1 application topically 2 (two) times daily.   colchicine 0.6 MG tablet Take 1 tablet (0.6 mg total) by mouth 2 (two) times daily as needed. For gout flares.   emtricitabine-rilpivir-tenofovir AF 200-25-25 MG Tabs tablet Commonly known as:  ODEFSEY Take 1 tablet by mouth daily with breakfast.   famotidine 20 MG  tablet Commonly known as:  PEPCID Take 1 tablet (20 mg total) by mouth 2 (two) times daily. What changed:  when to take this  reasons to take this   hydrOXYzine 50 MG tablet Commonly known as:  ATARAX/VISTARIL take 1 tablet by mouth three times a day if needed What changed:  how much to take  how to take this  when to take this  reasons to take this  additional instructions   mirtazapine 7.5 MG tablet Commonly known as:  REMERON Take 1 tablet (7.5 mg total) by mouth at bedtime.   oxyCODONE-acetaminophen 5-325 MG tablet Commonly known as:  ROXICET Take 1-2 tablets by mouth every 4 (four) hours as needed for severe pain.   spironolactone 25 MG tablet Commonly known as:  ALDACTONE Take 0.5 tablets (12.5 mg total) by mouth daily.   TIVICAY 50 MG tablet Generic drug:  dolutegravir take 1 tablet by mouth once daily WITH LUNCH   triamcinolone cream 0.1 % Commonly known as:  KENALOG Apply 1 application topically 3 (three) times daily.   valACYclovir 1000 MG tablet Commonly known as:  VALTREX Take 1 tablet (1,000 mg total) by mouth 2 (two) times daily as needed (outbreaks). Take for 5 days for an [place]  Discharge Care Instructions        Start     Ordered   01/29/17 0000  carvedilol (COREG) 3.125 MG tablet  2 times daily with meals     01/29/17 1018   01/29/17 0000  spironolactone (ALDACTONE) 25 MG tablet  Daily     01/29/17 1018   01/29/17 0000  Increase activity slowly     01/29/17 1018   01/29/17 0000  Diet - low sodium heart healthy     01/29/17 1018   01/27/17 0000  oxyCODONE-acetaminophen (ROXICET) 5-325 MG tablet  Every 4 hours PRN     01/27/17 0800   01/26/17 0000  aspirin EC 325 MG tablet  Daily     01/26/17 1356      Contact information for follow-up providers    Sheral Apley, MD Follow up in 2 week(s).   Specialty:  Orthopedic Surgery Contact information: [address] [place]  [address] [phone]        [facility] Follow up on 02/05/2017.   Specialty:  Cardiology Why:  At 0930 for post hospital follow up. The code for parking is 7002. Contact information: [address] [id number] mc [place] [address] [phone]           Contact information for after-discharge care    Destination    [facility] .   Specialty:  Skilled Nursing Facility Contact information: [address] [place] [address] [phone]                 Allergies  Allergen Reactions  . Bactrim Rash    Consultations: Treatment Team:  Sheral Apley, MD  Heart failure team.   Procedures Echo done on 01/26/2017. (LVEF in May 2015 was 20-25%)  Study Conclusions  - Left ventricle: The cavity size was normal. Wall thickness was   normal. Systolic function was vigorous. The estimated ejection   fraction was in the range of 65% to 70%. Wall motion was normal;   there were no regional wall motion abnormalities. Doppler   parameters are consistent with abnormal left ventricular   relaxation (grade 1 diastolic dysfunction). - Aortic valve: There was mild regurgitation. - Right ventricle: Pacer wire or catheter noted in right ventricle.  Impressions:  - Compared to the prior study, there has been no significant   interval change.  Radiological studies: Dg Chest 2 View  Result Date: 01/25/2017 CLINICAL DATA:  Fall.  Chest pain. EXAM: CHEST  2 VIEW COMPARISON:  01/27/2014.  02/12/2013.  CT 01/20/2013.  CT 01/20/2012 FINDINGS: AICD in stable position. Heart size normal. Stable left base pleuroparenchymal thickening consistent with scarring. Follow-up chest x-ray recommended to exclude an underlying focal lesion/mass. No pleural effusion or pneumothorax. No evidence of displaced rib fracture. IMPRESSION: 1. Stable left base pleuroparenchymal thickening consistent with  scarring. No acute abnormality identified . 2. AICD in stable position . Electronically Signed   By: Maisie Fus  Register   On: 01/25/2017 16:33   Dg Ankle Complete Right  Result Date: 01/25/2017 CLINICAL DATA:  Continued acute right ankle pain following fall 1 week ago. Initial encounter. EXAM: RIGHT ANKLE - COMPLETE 3+ VIEW COMPARISON:  None. FINDINGS: An oblique fracture of the distal fibula (4 mm lateral/ posterior displacement), a comminuted fracture the medial malleolus, and a nondisplaced fracture of the posterior tibial malleolus noted. A 1.5 cm bony fragment anterior to the joint on the lateral view is noted,  likely displaced from the distal tibia. There is no evidence of subluxation or dislocation. IMPRESSION: Trimalleolar fractures as described. 1.5 cm bony fragment anterior to the ankle joint on the lateral view likely displaced from the distal tibia. Electronically Signed   By: Harmon Pier M.D.   On: 01/25/2017 16:31   Ct Knee Left Wo Contrast  Result Date: 01/26/2017 CLINICAL DATA:  Proximal tibia fracture secondary to a fall yesterday. EXAM: CT OF THE LEFT KNEE WITHOUT CONTRAST TECHNIQUE: Multidetector CT imaging of the left knee was performed according to the standard protocol. Multiplanar CT image reconstructions were also generated. COMPARISON:  Radiographs dated 01/25/2017 FINDINGS: Bones/Joint/Cartilage There is an avulsion fracture of the posterior aspect of the medial tibial plateau extending across the midline involving the tibial insertion of the posterior cruciate ligament. There is minimal distraction of approximately 3 mm. There is no depression of fracture fragments. No other fractures.  Small joint effusion. Ligaments Suboptimally assessed by CT. ACL appears to be intact. Prominent soft tissue swelling adjacent to the MCL suggesting the possibility of a MCL sprain. LCL appears normal. Muscles and Tendons Negative. Soft tissues Negative. IMPRESSION: Large avulsion fracture involving  the posterior aspect of the medial tibial plateau as well as the tibial attachment of the posterior cruciate ligament. Electronically Signed   By: Francene Boyers M.D.   On: 01/26/2017 10:19   Dg Knee Complete 4 Views Left  Result Date: 01/25/2017 CLINICAL DATA:  Patient with diffuse knee pain status post fall. EXAM: LEFT KNEE - COMPLETE 4+ VIEW COMPARISON:  Left knee radiograph 11/23/2012. FINDINGS: Distal femur is unremarkable. There is a mildly distracted oblique fracture through the mid aspect of the proximal tibia extending to the articular surface near the tibial spines. Proximal fibula is unremarkable. Regional soft tissues are unremarkable. IMPRESSION: Fracture of the proximal tibia extending to the articular surface. Electronically Signed   By: Annia Belt M.D.   On: 01/25/2017 16:34   Dg Hand Complete Left  Result Date: 01/25/2017 CLINICAL DATA:  Left hand pain secondary to a fall one week ago. EXAM: LEFT HAND - COMPLETE 3+ VIEW COMPARISON:  None. FINDINGS: There is a slightly displaced the hand angulated fracture of the base of the first metacarpal. No dislocation.  The other bones appear normal. IMPRESSION: Fracture of the base of the proximal first metacarpal with slight angulation and displacement. Electronically Signed   By: Francene Boyers M.D.   On: 01/25/2017 16:40   Dg Finger Thumb Left  Result Date: 01/25/2017 CLINICAL DATA:  Acute left thumb pain following fall 1 week ago. Initial encounter. EXAM: LEFT THUMB 2+V COMPARISON:  None. FINDINGS: A fracture of the proximal first metacarpal is identified and does not appear to extend to the carpometacarpal joint. Mild apex dorsal angulation is noted. No subluxation or dislocation. IMPRESSION: Proximal first metacarpal fracture. Electronically Signed   By: Harmon Pier M.D.   On: 01/25/2017 16:33    Subjective:  Discharge Exam: Vitals:   01/28/17 0526 01/28/17 1437 01/28/17 2152 01/29/17 0500  BP: 126/77 124/79 114/82 123/81  Pulse: 75  74 78 72  Resp: 18  18   Temp: 98.2 F (36.8 C) 98.6 F (37 C) (!) 97.5 F (36.4 C) 98.8 F (37.1 C)  TempSrc:  Oral Oral Oral  SpO2: 95% 98% 99% 95%  Weight: 74 kg (163 lb 2.3 oz)   80.7 kg (178 lb)  Height:       General: Pt is alert, awake, not in acute distress Cardiovascular:  RRR, S1/S2 +, no rubs, no gallops Respiratory: CTA bilaterally, no wheezing, no rhonchi Abdominal: Soft, NT, ND, bowel sounds + Extremities: no edema, no cyanosis   The results of significant diagnostics from this hospitalization (including imaging, microbiology, ancillary and laboratory) are listed below for reference.    Microbiology: Recent Results (from the past 240 hour(s))  Surgical pcr screen     Status: Abnormal   Collection Time: 01/25/17 11:12 PM  Result Value Ref Range Status   MRSA, PCR NEGATIVE NEGATIVE Final   Staphylococcus aureus POSITIVE (A) NEGATIVE Final    Comment:        The Xpert SA Assay (FDA approved for NASAL specimens in patients over 14 years of age), is one component of a comprehensive surveillance program.  Test performance has been validated by [facility] for patients greater than or equal to 56 year old. It is not intended to diagnose infection nor to guide or monitor treatment.      Labs: BNP (last 3 results)  Recent Labs  12/18/16 1144  BNP 49.5   Basic Metabolic Panel:  Recent Labs Lab 01/25/17 1655 01/26/17 0613 01/27/17 0514 01/28/17 0331 01/29/17 0510  NA 140 138 139 137 139  K 4.6 4.5 4.6 4.1 4.0  CL 108 110 110 110 111  CO2 20* 19* 20* 21* 22  GLUCOSE 103* 95 92 91 106*  BUN 39* 36* 16 10 8   CREATININE 3.19* 2.36* 1.31* 1.15 1.09  CALCIUM 9.3 8.9 8.8* 8.8* 8.8*  MG  --  2.0  --   --   --   PHOS  --  5.0*  --   --   --    Liver Function Tests:  Recent Labs Lab 01/25/17 1655 01/26/17 0613  AST 93* 84*  ALT 68* 56  ALKPHOS 148* 109  BILITOT 1.9* 1.7*  PROT 8.0 6.6  ALBUMIN 3.4* 2.9*   No results for input(s): LIPASE,  AMYLASE in the last 168 hours. No results for input(s): AMMONIA in the last 168 hours. CBC:  Recent Labs Lab 01/25/17 1655 01/26/17 0613  WBC 9.5 8.7  NEUTROABS 5.5 4.5  HGB 13.7 12.2*  HCT 41.5 39.1  MCV 94.1 95.4  PLT 153 138*   Cardiac Enzymes:  Recent Labs Lab 01/25/17 2315 01/26/17 0613  TROPONINI <0.03 <0.03   BNP: Invalid input(s): POCBNP CBG:  Recent Labs Lab 01/26/17 0613 01/26/17 0747 01/28/17 0735 01/29/17 0800  GLUCAP 100* 86 109* 90   D-Dimer No results for input(s): DDIMER in the last 72 hours. Hgb A1c No results for input(s): HGBA1C in the last 72 hours. Lipid Profile No results for input(s): CHOL, HDL, LDLCALC, TRIG, CHOLHDL, LDLDIRECT in the last 72 hours. Thyroid function studies No results for input(s): TSH, T4TOTAL, T3FREE, THYROIDAB in the last 72 hours.  Invalid input(s): FREET3 Anemia work up No results for input(s): VITAMINB12, FOLATE, FERRITIN, TIBC, IRON, RETICCTPCT in the last 72 hours. Urinalysis    Component Value Date/Time   COLORURINE AMBER (A) 01/26/2017 0657   APPEARANCEUR CLEAR 01/26/2017 0657   LABSPEC 1.018 01/26/2017 0657   PHURINE 5.0 01/26/2017 0657   GLUCOSEU NEGATIVE 01/26/2017 0657   GLUCOSEU NEG mg/dL 32/44/0102 7253   HGBUR NEGATIVE 01/26/2017 0657   HGBUR negative 09/21/2007 1425   BILIRUBINUR NEGATIVE 01/26/2017 0657   KETONESUR NEGATIVE 01/26/2017 0657   PROTEINUR NEGATIVE 01/26/2017 0657   UROBILINOGEN 0.2 02/12/2013 1406   NITRITE NEGATIVE 01/26/2017 0657   LEUKOCYTESUR NEGATIVE 01/26/2017 0657   Sepsis Labs Invalid  input(s): PROCALCITONIN,  WBC,  LACTICIDVEN Microbiology Recent Results (from the past 240 hour(s))  Surgical pcr screen     Status: Abnormal   Collection Time: 01/25/17 11:12 PM  Result Value Ref Range Status   MRSA, PCR NEGATIVE NEGATIVE Final   Staphylococcus aureus POSITIVE (A) NEGATIVE Final    Comment:        The Xpert SA Assay (FDA approved for NASAL specimens in patients  over 21 years of age), is one component of a comprehensive surveillance program.  Test performance has been validated by [facility] for patients greater than or equal to 26 year old. It is not intended to diagnose infection nor to guide or monitor treatment.      Time coordinating discharge: Over 30 minutes  SIGNED:   Clint Lipps, MD  [facility] 01/29/2017, 12:08 PM Pager   If 7PM-7AM, please contact night-coverage [email or web address] Password [email or web address]

## 2017-01-29 NOTE — Clinical Social Work Note (Signed)
Clinical Social Worker facilitated patient discharge including contacting patient family and facility to confirm patient discharge plans.  Clinical information faxed to facility and family agreeable with plan.  CSW arranged ambulance transport via [facility] to [facility] .  RN to call [phone] for report prior to discharge. Patient will go to room 808 P.  Clinical Social Worker will sign off for now as social work intervention is no longer needed. Please consult [place] again if new need arises.  [place], [place] [phone]

## 2017-02-01 ENCOUNTER — Telehealth: Payer: Self-pay | Admitting: Cardiology

## 2017-02-01 ENCOUNTER — Encounter (HOSPITAL_COMMUNITY): Payer: Self-pay | Admitting: Orthopedic Surgery

## 2017-02-01 NOTE — Telephone Encounter (Signed)
Pt called to let us know he's in a nursing home after his recent hospitalization.  Shevette Bess PA-C 02/01/2017 11:09 AM .

## 2017-02-03 DIAGNOSIS — M25562 Pain in left knee: Secondary | ICD-10-CM | POA: Diagnosis not present

## 2017-02-03 DIAGNOSIS — N179 Acute kidney failure, unspecified: Secondary | ICD-10-CM | POA: Diagnosis not present

## 2017-02-03 DIAGNOSIS — M79642 Pain in left hand: Secondary | ICD-10-CM | POA: Diagnosis not present

## 2017-02-03 DIAGNOSIS — M25571 Pain in right ankle and joints of right foot: Secondary | ICD-10-CM | POA: Diagnosis not present

## 2017-02-03 DIAGNOSIS — Z5189 Encounter for other specified aftercare: Secondary | ICD-10-CM | POA: Diagnosis not present

## 2017-02-03 DIAGNOSIS — B20 Human immunodeficiency virus [HIV] disease: Secondary | ICD-10-CM | POA: Diagnosis not present

## 2017-02-03 DIAGNOSIS — B182 Chronic viral hepatitis C: Secondary | ICD-10-CM | POA: Diagnosis not present

## 2017-02-04 DIAGNOSIS — M25571 Pain in right ankle and joints of right foot: Secondary | ICD-10-CM | POA: Diagnosis not present

## 2017-02-04 DIAGNOSIS — B182 Chronic viral hepatitis C: Secondary | ICD-10-CM | POA: Diagnosis not present

## 2017-02-04 DIAGNOSIS — M79642 Pain in left hand: Secondary | ICD-10-CM | POA: Diagnosis not present

## 2017-02-04 DIAGNOSIS — B20 Human immunodeficiency virus [HIV] disease: Secondary | ICD-10-CM | POA: Diagnosis not present

## 2017-02-04 DIAGNOSIS — M25562 Pain in left knee: Secondary | ICD-10-CM | POA: Diagnosis not present

## 2017-02-04 DIAGNOSIS — N179 Acute kidney failure, unspecified: Secondary | ICD-10-CM | POA: Diagnosis not present

## 2017-02-04 DIAGNOSIS — Z5189 Encounter for other specified aftercare: Secondary | ICD-10-CM | POA: Diagnosis not present

## 2017-02-05 ENCOUNTER — Ambulatory Visit (HOSPITAL_COMMUNITY)
Admission: RE | Admit: 2017-02-05 | Discharge: 2017-02-05 | Disposition: A | Discharge status: Home / Self Care | Payer: No Typology Code available for payment source | Source: Ambulatory Visit | Attending: Internal Medicine | Admitting: Internal Medicine

## 2017-02-05 VITALS — BP 156/90 | HR 63

## 2017-02-05 DIAGNOSIS — I110 Hypertensive heart disease with heart failure: Secondary | ICD-10-CM | POA: Diagnosis not present

## 2017-02-05 DIAGNOSIS — B1920 Unspecified viral hepatitis C without hepatic coma: Secondary | ICD-10-CM | POA: Insufficient documentation

## 2017-02-05 DIAGNOSIS — I429 Cardiomyopathy, unspecified: Secondary | ICD-10-CM | POA: Diagnosis not present

## 2017-02-05 DIAGNOSIS — Z21 Asymptomatic human immunodeficiency virus [HIV] infection status: Secondary | ICD-10-CM | POA: Diagnosis not present

## 2017-02-05 DIAGNOSIS — I472 Ventricular tachycardia: Secondary | ICD-10-CM | POA: Diagnosis not present

## 2017-02-05 DIAGNOSIS — I10 Essential (primary) hypertension: Secondary | ICD-10-CM | POA: Diagnosis not present

## 2017-02-05 DIAGNOSIS — Z95810 Presence of automatic (implantable) cardiac defibrillator: Secondary | ICD-10-CM | POA: Diagnosis not present

## 2017-02-05 DIAGNOSIS — I951 Orthostatic hypotension: Secondary | ICD-10-CM

## 2017-02-05 DIAGNOSIS — I5022 Chronic systolic (congestive) heart failure: Secondary | ICD-10-CM | POA: Insufficient documentation

## 2017-02-05 DIAGNOSIS — Z79899 Other long term (current) drug therapy: Secondary | ICD-10-CM | POA: Insufficient documentation

## 2017-02-05 DIAGNOSIS — Z7982 Long term (current) use of aspirin: Secondary | ICD-10-CM | POA: Insufficient documentation

## 2017-02-05 LAB — BASIC METABOLIC PANEL
ANION GAP: 10 (ref 5–15)
BUN: 13 mg/dL (ref 6–20)
CHLORIDE: 111 mmol/L (ref 101–111)
CO2: 22 mmol/L (ref 22–32)
Calcium: 9 mg/dL (ref 8.9–10.3)
Creatinine, Ser: 1.11 mg/dL (ref 0.61–1.24)
GFR calc Af Amer: >60 mL/min (ref >60)
GLUCOSE: 88 mg/dL (ref 65–99)
POTASSIUM: 3.9 mmol/L (ref 3.5–5.1)
Sodium: 143 mmol/L (ref 135–145)

## 2017-02-05 MED ORDER — SPIRONOLACTONE 25 MG PO TABS: 25.0000 mg | ORAL_TABLET | Freq: Every day | ORAL | 6 refills | Status: DC

## 2017-02-05 NOTE — Addendum Note (Signed)
Encounter addended by: Little Ishikawa, NP on: 02/05/2017 10:42 AM<BR>    Actions taken: Sign clinical note

## 2017-02-05 NOTE — Patient Instructions (Signed)
INCREASE Spironolactone to 25 mg (1 whole tablet) once daily at bedtime.  Routine lab work today. Will notify you of abnormal results, otherwise no news is good news!  Follow up with Dr. [name] in 6 weeks.  _________________________________________________________________  _________________________________________________________________  Take all medication as prescribed the day of your appointment. Bring all medications with you to your appointment.  Do the following things EVERYDAY: 1) Weigh yourself in the morning before breakfast. Write it down and keep it in a log. 2) Take your medicines as prescribed 3) Eat low salt foods-Limit salt (sodium) to 2000 mg per day.  4) Stay as active as you can everyday 5) Limit all fluids for the day to less than 2 liters

## 2017-02-05 NOTE — Progress Notes (Addendum)
Patient ID: [name], male   DOB: Nov 30, 1965, 51 y.o.   MRN: [id number]   ADVANCED HEART FAILURE CLINIC  Patient ID: [name], male   DOB: December 02, 1965, 51 y.o.   MRN: [id number]  ---Medtronic Optivol ----   Primary Physician: [name], MD  Primary Cardiologist: Dr Ladona Ridgel  Primary HF: Dr. [name]    HPI: [name] is a 51 y.o. male with a history of HIV 1998, HCV, ETOH abuse and CHF due to NICM s/p Medtronic  CRT-D (06/2013). He also has a h/o syncope and previously had an EP study which was non-inducible for VT.    Was admitted 01/19/13- 01/22/13 for acute on chronic systolic HF. ECHO EF 20-25%. He was diuresed with IV lasix. He was discharged on coreg 6.25 mg BID, digoxin 0.25 mg daily, lisinopril 40 mg daily, and spironolactone 25 mg daily. Discharge weight 150 lbs. He stopped spironolactone due to severe headaches.  He was not able to tolerate titration of Coreg to 25 mg bid due to lightheadedness.    Echo in 12/17 shows that EF remains back in normal range.   Admitted 8/27-8/31/18 for syncope resulting in trimalleolar fracture of the right ankle and left proximal tibia. He underwent ORIF of right trimalleolar fracture. He was discharged to SNF.   He returns today for HF follow up. Still having pain in R ankle. Denies SOB with working with PT at [facility]. He cannot bear weight on his R ankle yet, so orthostatics not checked today. He reports that his BP at [facility] is in the 160's. He is frustrated today about his condition. Unsure why he had syncope in the first place. Denies chest pain, palpitations.   Optivol reviewed today: Fluid increasing, no VT/VF.   ECHO 04/2013: EF 20-25%, mod/severe MR, LA mod dilated ECHO 10/16/13 EF 20-25%  ECHO 03/14/15 EF 55% ECHO 12/17 EF 55% ECHO 8/18 EF 65-70%.   Labs (12/14): digoxin 0.5 Labs (2/15): K 4.2, creatinine 0.88 Labs (08/2013): K 4.2 Creatinine 1.02 Labs (10/15): K 4.0, creatinine 1.16 Labs (1/16): K 4.1,  creatinine 1.28, AST 54, ALT 72 Labs (07/04/2014) : K 4.1 Creatinine 1.18  Labs (5/18): K 4.8, creatinine 1.3  SH: Lives with partner in [place], drinking a wine cooler twice a week (prior heavy ETOH).   ROS: All systems negative except as listed in HPI, PMH and Problem List.  Family History  Problem Relation Age of Onset  . Lung cancer Mother   . Colon cancer Unknown 50   Past Medical History:  Diagnosis Date  . CHF (congestive heart failure) (HCC)   . Chronic systolic heart failure (HCC)    a. Echo 12/05/11:  EF 20-25%, diff HK with mild sparing of IL wall, mild AI, mod MR, mild LAE, mild RVE, mild reduced RVF.;  b.  Echo 05/11/2012:  Mild LVH, EF 20-25%, Gr 1 diast dysfn, mod MR, mild LAE  . Depression   . G6PD deficiency (HCC)   . GERD (gastroesophageal reflux disease)   . Hepatitis    Hep B and Hep C (patient does not report this but these are listed in previous notes.)  . HIV infection (HCC)   . Hypertension   . NICM (nonischemic cardiomyopathy) (HCC)    cardia CTA 8/13 negative for obstructive CAD    Current Outpatient Prescriptions  Medication Sig Dispense Refill  . amiodarone (PACERONE) 200 MG tablet Take 1 tablet (200 mg total) by mouth daily. 30 tablet 11  . aspirin EC  325 MG tablet Take 1 tablet (325 mg total) by mouth daily. For 30 days post op for DVT Prophylaxis 30 tablet 0  . carvedilol (COREG) 3.125 MG tablet Take 1.56 mg by mouth 2 (two) times daily with a meal.    . clobetasol ointment (TEMOVATE) 0.05 % Apply 1 application topically 2 (two) times daily.  0  . emtricitabine-rilpivir-tenofovir AF (ODEFSEY) 200-25-25 MG TABS tablet Take 1 tablet by mouth daily with breakfast. 30 tablet 5  . famotidine (PEPCID) 20 MG tablet Take 1 tablet (20 mg total) by mouth 2 (two) times daily. (Patient taking differently: Take 20 mg by mouth as needed. ) 30 tablet 0  . hydrOXYzine (ATARAX/VISTARIL) 50 MG tablet take 1 tablet by mouth three times a day if needed (Patient taking  differently: Take 50 mg by mouth 3 (three) times daily as needed for itching. take 1 tablet by mouth three times a day if needed) 15 tablet 1  . mirtazapine (REMERON) 7.5 MG tablet Take 1 tablet (7.5 mg total) by mouth at bedtime. 30 tablet 5  . oxyCODONE-acetaminophen (ROXICET) 5-325 MG tablet Take 1-2 tablets by mouth every 4 (four) hours as needed for severe pain. 40 tablet 0  . spironolactone (ALDACTONE) 25 MG tablet Take 0.5 tablets (12.5 mg total) by mouth daily. 30 tablet 0  . TIVICAY 50 MG tablet take 1 tablet by mouth once daily WITH LUNCH 30 tablet 5  . triamcinolone cream (KENALOG) 0.1 % Apply 1 application topically 3 (three) times daily.  0  . colchicine 0.6 MG tablet Take 1 tablet (0.6 mg total) by mouth 2 (two) times daily as needed. For gout flares. (Patient not taking: Reported on 02/05/2017) 30 tablet 2  . valACYclovir (VALTREX) 1000 MG tablet Take 1 tablet (1,000 mg total) by mouth 2 (two) times daily as needed (outbreaks). Take for 5 days for an outbreak (Patient not taking: Reported on 02/05/2017) 10 tablet 6   No current facility-administered medications for this encounter.     Vitals:   02/05/17 0955  BP: (!) 156/90  Pulse: 63  SpO2: 98%    Physical Exam:  General:  Ill-appearing HEENT: Icteric Neck: supple. no JVD. Carotids 2+ bilat; no bruits. No lymphadenopathy or thryomegaly appreciated. Cor: PMI nondisplaced. Regular rate & rhythm. No rubs, gallops or murmurs. Lungs: Clear to auscultation bilaterally.  Abdomen: soft, nontender, moderately distended. No hepatosplenomegaly. No bruits or masses. Good bowel sounds. Extremities: no cyanosis, clubbing, rash, edema.  Neuro: alert & orientedx3, cranial nerves grossly intact. moves all 4 extremities w/o difficulty. Affect pleasant Skin: Jaundice.  Papular rash across body.   ASSESSMENT & PLAN:  1) Chronic systolic HF: Nonischemic cardiomyopathy, ?due to ETOH abuse. EF 55% (03/2015) s/p Medtronic CRT-D (06/2013).  EF  normal by most recent Echo in 12/2016.  - NYHA I-II, limited now by mobility issues due to recent ankle fracture.  - Volume status ok on exam. Optivol shows fluid increasing, however I am inclined to think that he needs more fluid in light of recent syncope and orthostatic hypotension.  - Continue low dose Coreg 3.125 mg BID.  - Increase Spiro to 25 mg hs. I am hesitant to increase any other medications with recent syncope and fall. He is frustrated today that his BP is running high, however I explained the importance of slow medication adjustments in light of recent events.  - His EF has now normalized. - Continue to avoid ETOH.   2) Orthostatic hypotension/Syncope - no VT by device interrogation -  As above, adjust medications slowly.    4) Ventricular tachycardia:  - Continue Amio. No VT by device interrogation. Follows with Dr. Ladona Ridgel.   BMET today. He would like to see Dr. [name] next visit. Follow up in 6 weeks for further medication titration.     Little Ishikawa 02/05/2017 9:55 AM

## 2017-02-08 DIAGNOSIS — M25571 Pain in right ankle and joints of right foot: Secondary | ICD-10-CM | POA: Diagnosis not present

## 2017-02-08 DIAGNOSIS — S62202D Unspecified fracture of first metacarpal bone, left hand, subsequent encounter for fracture with routine healing: Secondary | ICD-10-CM | POA: Diagnosis not present

## 2017-02-08 DIAGNOSIS — N179 Acute kidney failure, unspecified: Secondary | ICD-10-CM | POA: Diagnosis not present

## 2017-02-08 DIAGNOSIS — S82145D Nondisplaced bicondylar fracture of left tibia, subsequent encounter for closed fracture with routine healing: Secondary | ICD-10-CM | POA: Diagnosis not present

## 2017-02-08 DIAGNOSIS — M25562 Pain in left knee: Secondary | ICD-10-CM | POA: Diagnosis not present

## 2017-02-08 DIAGNOSIS — Z5189 Encounter for other specified aftercare: Secondary | ICD-10-CM | POA: Diagnosis not present

## 2017-02-08 DIAGNOSIS — M79642 Pain in left hand: Secondary | ICD-10-CM | POA: Diagnosis not present

## 2017-02-08 DIAGNOSIS — S82851D Displaced trimalleolar fracture of right lower leg, subsequent encounter for closed fracture with routine healing: Secondary | ICD-10-CM | POA: Diagnosis not present

## 2017-02-08 DIAGNOSIS — B182 Chronic viral hepatitis C: Secondary | ICD-10-CM | POA: Diagnosis not present

## 2017-02-08 DIAGNOSIS — B20 Human immunodeficiency virus [HIV] disease: Secondary | ICD-10-CM | POA: Diagnosis not present

## 2017-02-09 ENCOUNTER — Encounter: Payer: Self-pay | Admitting: Cardiology

## 2017-02-10 DIAGNOSIS — M79642 Pain in left hand: Secondary | ICD-10-CM | POA: Diagnosis not present

## 2017-02-10 DIAGNOSIS — M25562 Pain in left knee: Secondary | ICD-10-CM | POA: Diagnosis not present

## 2017-02-10 DIAGNOSIS — B20 Human immunodeficiency virus [HIV] disease: Secondary | ICD-10-CM | POA: Diagnosis not present

## 2017-02-10 DIAGNOSIS — M25571 Pain in right ankle and joints of right foot: Secondary | ICD-10-CM | POA: Diagnosis not present

## 2017-02-10 DIAGNOSIS — Z5189 Encounter for other specified aftercare: Secondary | ICD-10-CM | POA: Diagnosis not present

## 2017-02-10 DIAGNOSIS — N179 Acute kidney failure, unspecified: Secondary | ICD-10-CM | POA: Diagnosis not present

## 2017-02-10 DIAGNOSIS — B182 Chronic viral hepatitis C: Secondary | ICD-10-CM | POA: Diagnosis not present

## 2017-02-12 ENCOUNTER — Telehealth (HOSPITAL_COMMUNITY): Payer: Self-pay

## 2017-02-12 DIAGNOSIS — Z5189 Encounter for other specified aftercare: Secondary | ICD-10-CM | POA: Diagnosis not present

## 2017-02-12 DIAGNOSIS — B182 Chronic viral hepatitis C: Secondary | ICD-10-CM | POA: Diagnosis not present

## 2017-02-12 DIAGNOSIS — M79642 Pain in left hand: Secondary | ICD-10-CM | POA: Diagnosis not present

## 2017-02-12 DIAGNOSIS — N179 Acute kidney failure, unspecified: Secondary | ICD-10-CM | POA: Diagnosis not present

## 2017-02-12 DIAGNOSIS — M25562 Pain in left knee: Secondary | ICD-10-CM | POA: Diagnosis not present

## 2017-02-12 DIAGNOSIS — M25571 Pain in right ankle and joints of right foot: Secondary | ICD-10-CM | POA: Diagnosis not present

## 2017-02-12 DIAGNOSIS — B20 Human immunodeficiency virus [HIV] disease: Secondary | ICD-10-CM | POA: Diagnosis not present

## 2017-02-12 MED ORDER — SACUBITRIL-VALSARTAN 24-26 MG PO TABS: 1.0000 | ORAL_TABLET | Freq: Two times a day (BID) | ORAL | 6 refills | Status: DC

## 2017-02-12 NOTE — Telephone Encounter (Signed)
Patient's BP still running 150/100s this past week. Per Dr. Shirlee Latch ordered Entresto 24/26 mg twice daily. Patient made aware.  Ave Filter, RN

## 2017-02-15 ENCOUNTER — Telehealth (HOSPITAL_COMMUNITY): Payer: Self-pay | Admitting: *Deleted

## 2017-02-15 DIAGNOSIS — Z5189 Encounter for other specified aftercare: Secondary | ICD-10-CM | POA: Diagnosis not present

## 2017-02-15 DIAGNOSIS — M25571 Pain in right ankle and joints of right foot: Secondary | ICD-10-CM | POA: Diagnosis not present

## 2017-02-15 DIAGNOSIS — N179 Acute kidney failure, unspecified: Secondary | ICD-10-CM | POA: Diagnosis not present

## 2017-02-15 DIAGNOSIS — M25562 Pain in left knee: Secondary | ICD-10-CM | POA: Diagnosis not present

## 2017-02-15 DIAGNOSIS — M79642 Pain in left hand: Secondary | ICD-10-CM | POA: Diagnosis not present

## 2017-02-15 DIAGNOSIS — B20 Human immunodeficiency virus [HIV] disease: Secondary | ICD-10-CM | POA: Diagnosis not present

## 2017-02-15 DIAGNOSIS — B182 Chronic viral hepatitis C: Secondary | ICD-10-CM | POA: Diagnosis not present

## 2017-02-15 NOTE — Telephone Encounter (Signed)
Advanced Heart Failure Triage Encounter  Patient Name: [name]   Date of Call: 02/15/2017  Problem: High BP  Patient called to report his BP readings have been high for the past week at 160/110 and 170/118.  Patient recently hospitalized due to syncopal episode with low BP and medications were decreased.   Plan:  Will send to Dr. Shirlee Latch to review  Raza Bayless, Evlyn Kanner, RN

## 2017-02-16 MED ORDER — SACUBITRIL-VALSARTAN 49-51 MG PO TABS: 1.0000 | ORAL_TABLET | Freq: Two times a day (BID) | ORAL | 3 refills | Status: DC

## 2017-02-16 NOTE — Telephone Encounter (Signed)
Dr. Shirlee Latch recommends patient to increase Entresto to 49-51 BID. I called and spoke with patient and he is currently in [facility] facility and asked for me to call them with the dose increase.   Patient also verbalized he was trying to go home by this Friday and wanted me to speak to his nurse there.    I tried calling [facility] but had to leave message for nurse to call me back.  I did fax the increased entresto to [phone].

## 2017-02-17 DIAGNOSIS — B182 Chronic viral hepatitis C: Secondary | ICD-10-CM | POA: Diagnosis not present

## 2017-02-17 DIAGNOSIS — B20 Human immunodeficiency virus [HIV] disease: Secondary | ICD-10-CM | POA: Diagnosis not present

## 2017-02-17 DIAGNOSIS — M25562 Pain in left knee: Secondary | ICD-10-CM | POA: Diagnosis not present

## 2017-02-17 DIAGNOSIS — M79642 Pain in left hand: Secondary | ICD-10-CM | POA: Diagnosis not present

## 2017-02-17 DIAGNOSIS — N179 Acute kidney failure, unspecified: Secondary | ICD-10-CM | POA: Diagnosis not present

## 2017-02-17 DIAGNOSIS — Z5189 Encounter for other specified aftercare: Secondary | ICD-10-CM | POA: Diagnosis not present

## 2017-02-17 DIAGNOSIS — M25571 Pain in right ankle and joints of right foot: Secondary | ICD-10-CM | POA: Diagnosis not present

## 2017-02-23 ENCOUNTER — Other Ambulatory Visit: Payer: Self-pay | Admitting: Internal Medicine

## 2017-02-24 DIAGNOSIS — I429 Cardiomyopathy, unspecified: Secondary | ICD-10-CM | POA: Diagnosis not present

## 2017-02-24 DIAGNOSIS — S62202D Unspecified fracture of first metacarpal bone, left hand, subsequent encounter for fracture with routine healing: Secondary | ICD-10-CM | POA: Diagnosis not present

## 2017-02-24 DIAGNOSIS — S82102D Unspecified fracture of upper end of left tibia, subsequent encounter for closed fracture with routine healing: Secondary | ICD-10-CM | POA: Diagnosis not present

## 2017-02-24 DIAGNOSIS — S82851D Displaced trimalleolar fracture of right lower leg, subsequent encounter for closed fracture with routine healing: Secondary | ICD-10-CM | POA: Diagnosis not present

## 2017-02-24 DIAGNOSIS — I5022 Chronic systolic (congestive) heart failure: Secondary | ICD-10-CM

## 2017-02-24 DIAGNOSIS — I110 Hypertensive heart disease with heart failure: Secondary | ICD-10-CM | POA: Diagnosis not present

## 2017-02-26 DIAGNOSIS — S82102D Unspecified fracture of upper end of left tibia, subsequent encounter for closed fracture with routine healing: Secondary | ICD-10-CM | POA: Diagnosis not present

## 2017-02-26 DIAGNOSIS — I110 Hypertensive heart disease with heart failure: Secondary | ICD-10-CM | POA: Diagnosis not present

## 2017-02-26 DIAGNOSIS — S82851D Displaced trimalleolar fracture of right lower leg, subsequent encounter for closed fracture with routine healing: Secondary | ICD-10-CM | POA: Diagnosis not present

## 2017-02-26 DIAGNOSIS — I429 Cardiomyopathy, unspecified: Secondary | ICD-10-CM | POA: Diagnosis not present

## 2017-02-26 DIAGNOSIS — S62202D Unspecified fracture of first metacarpal bone, left hand, subsequent encounter for fracture with routine healing: Secondary | ICD-10-CM | POA: Diagnosis not present

## 2017-02-26 DIAGNOSIS — I5022 Chronic systolic (congestive) heart failure: Secondary | ICD-10-CM | POA: Diagnosis not present

## 2017-03-01 DIAGNOSIS — I110 Hypertensive heart disease with heart failure: Secondary | ICD-10-CM | POA: Diagnosis not present

## 2017-03-01 DIAGNOSIS — S62202D Unspecified fracture of first metacarpal bone, left hand, subsequent encounter for fracture with routine healing: Secondary | ICD-10-CM | POA: Diagnosis not present

## 2017-03-01 DIAGNOSIS — I5022 Chronic systolic (congestive) heart failure: Secondary | ICD-10-CM | POA: Diagnosis not present

## 2017-03-01 DIAGNOSIS — S82851D Displaced trimalleolar fracture of right lower leg, subsequent encounter for closed fracture with routine healing: Secondary | ICD-10-CM | POA: Diagnosis not present

## 2017-03-01 DIAGNOSIS — I429 Cardiomyopathy, unspecified: Secondary | ICD-10-CM | POA: Diagnosis not present

## 2017-03-01 DIAGNOSIS — S82102D Unspecified fracture of upper end of left tibia, subsequent encounter for closed fracture with routine healing: Secondary | ICD-10-CM | POA: Diagnosis not present

## 2017-03-03 DIAGNOSIS — L281 Prurigo nodularis: Secondary | ICD-10-CM | POA: Diagnosis not present

## 2017-03-03 DIAGNOSIS — L280 Lichen simplex chronicus: Secondary | ICD-10-CM | POA: Diagnosis not present

## 2017-03-04 DIAGNOSIS — S82851D Displaced trimalleolar fracture of right lower leg, subsequent encounter for closed fracture with routine healing: Secondary | ICD-10-CM | POA: Diagnosis not present

## 2017-03-04 DIAGNOSIS — I429 Cardiomyopathy, unspecified: Secondary | ICD-10-CM | POA: Diagnosis not present

## 2017-03-04 DIAGNOSIS — S62202D Unspecified fracture of first metacarpal bone, left hand, subsequent encounter for fracture with routine healing: Secondary | ICD-10-CM | POA: Diagnosis not present

## 2017-03-04 DIAGNOSIS — I110 Hypertensive heart disease with heart failure: Secondary | ICD-10-CM | POA: Diagnosis not present

## 2017-03-04 DIAGNOSIS — S82102D Unspecified fracture of upper end of left tibia, subsequent encounter for closed fracture with routine healing: Secondary | ICD-10-CM | POA: Diagnosis not present

## 2017-03-04 DIAGNOSIS — I5022 Chronic systolic (congestive) heart failure: Secondary | ICD-10-CM | POA: Diagnosis not present

## 2017-03-08 DIAGNOSIS — S82102D Unspecified fracture of upper end of left tibia, subsequent encounter for closed fracture with routine healing: Secondary | ICD-10-CM | POA: Diagnosis not present

## 2017-03-08 DIAGNOSIS — S82851D Displaced trimalleolar fracture of right lower leg, subsequent encounter for closed fracture with routine healing: Secondary | ICD-10-CM | POA: Diagnosis not present

## 2017-03-08 DIAGNOSIS — S82145D Nondisplaced bicondylar fracture of left tibia, subsequent encounter for closed fracture with routine healing: Secondary | ICD-10-CM | POA: Diagnosis not present

## 2017-03-08 DIAGNOSIS — I110 Hypertensive heart disease with heart failure: Secondary | ICD-10-CM | POA: Diagnosis not present

## 2017-03-08 DIAGNOSIS — S62202D Unspecified fracture of first metacarpal bone, left hand, subsequent encounter for fracture with routine healing: Secondary | ICD-10-CM | POA: Diagnosis not present

## 2017-03-08 DIAGNOSIS — I429 Cardiomyopathy, unspecified: Secondary | ICD-10-CM | POA: Diagnosis not present

## 2017-03-08 DIAGNOSIS — I5022 Chronic systolic (congestive) heart failure: Secondary | ICD-10-CM | POA: Diagnosis not present

## 2017-03-09 DIAGNOSIS — I110 Hypertensive heart disease with heart failure: Secondary | ICD-10-CM | POA: Diagnosis not present

## 2017-03-09 DIAGNOSIS — S62202D Unspecified fracture of first metacarpal bone, left hand, subsequent encounter for fracture with routine healing: Secondary | ICD-10-CM | POA: Diagnosis not present

## 2017-03-09 DIAGNOSIS — S82102D Unspecified fracture of upper end of left tibia, subsequent encounter for closed fracture with routine healing: Secondary | ICD-10-CM | POA: Diagnosis not present

## 2017-03-09 DIAGNOSIS — I429 Cardiomyopathy, unspecified: Secondary | ICD-10-CM | POA: Diagnosis not present

## 2017-03-09 DIAGNOSIS — I5022 Chronic systolic (congestive) heart failure: Secondary | ICD-10-CM | POA: Diagnosis not present

## 2017-03-09 DIAGNOSIS — S82851D Displaced trimalleolar fracture of right lower leg, subsequent encounter for closed fracture with routine healing: Secondary | ICD-10-CM | POA: Diagnosis not present

## 2017-03-10 ENCOUNTER — Ambulatory Visit: Payer: Medicare Other | Admitting: Family Medicine

## 2017-03-10 NOTE — Progress Notes (Deleted)
   Subjective:   Patient ID: [name]    DOB: 1965/10/26, 51 y.o. male   MRN: [id number]  CC: "***"  HPI: [name] is a 51 y.o. male who presents to clinic today ***. Problems discussed today are as follows:  ***: *** ROS: ***  Complete ROS performed, see HPI for pertinent.  PMFSH: ***. Smoking status reviewed. Medications reviewed.  Objective:   There were no vitals taken for this visit. Vitals and nursing note reviewed.  General: well nourished, well developed, in no acute distress with non-toxic appearance HEENT: normocephalic, atraumatic, moist mucous membranes Neck: supple, non-tender without lymphadenopathy CV: regular rate and rhythm without murmurs, rubs, or gallops, no lower extremity edema Lungs: clear to auscultation bilaterally with normal work of breathing Abdomen: soft, non-tender, non-distended, no masses or organomegaly palpable, normoactive bowel sounds Skin: warm, dry, no rashes or lesions, cap refill < 2 seconds Extremities: warm and well perfused, normal tone  Assessment & Plan:   No problem-specific Assessment & Plan notes found for this encounter.  No orders of the defined types were placed in this encounter.  No orders of the defined types were placed in this encounter.   Durward Parcel, DO [facility], PGY-2 03/10/2017 3:37 PM

## 2017-03-11 DIAGNOSIS — S62202D Unspecified fracture of first metacarpal bone, left hand, subsequent encounter for fracture with routine healing: Secondary | ICD-10-CM | POA: Diagnosis not present

## 2017-03-11 DIAGNOSIS — S82851D Displaced trimalleolar fracture of right lower leg, subsequent encounter for closed fracture with routine healing: Secondary | ICD-10-CM | POA: Diagnosis not present

## 2017-03-11 DIAGNOSIS — I110 Hypertensive heart disease with heart failure: Secondary | ICD-10-CM | POA: Diagnosis not present

## 2017-03-11 DIAGNOSIS — S82102D Unspecified fracture of upper end of left tibia, subsequent encounter for closed fracture with routine healing: Secondary | ICD-10-CM | POA: Diagnosis not present

## 2017-03-11 DIAGNOSIS — I429 Cardiomyopathy, unspecified: Secondary | ICD-10-CM | POA: Diagnosis not present

## 2017-03-11 DIAGNOSIS — I5022 Chronic systolic (congestive) heart failure: Secondary | ICD-10-CM | POA: Diagnosis not present

## 2017-03-15 ENCOUNTER — Ambulatory Visit (HOSPITAL_COMMUNITY)
Admission: RE | Admit: 2017-03-15 | Discharge: 2017-03-15 | Disposition: A | Discharge status: Home / Self Care | Payer: Medicare Other | Source: Ambulatory Visit | Attending: Internal Medicine | Admitting: Internal Medicine

## 2017-03-15 ENCOUNTER — Ambulatory Visit (INDEPENDENT_AMBULATORY_CARE_PROVIDER_SITE_OTHER): Payer: Medicare Other | Admitting: Family Medicine

## 2017-03-15 ENCOUNTER — Encounter: Payer: Self-pay | Admitting: Family Medicine

## 2017-03-15 VITALS — BP 76/58 | HR 72 | Temp 97.8°F | Ht 64.5 in | Wt 171.2 lb

## 2017-03-15 DIAGNOSIS — R9431 Abnormal electrocardiogram [ECG] [EKG]: Secondary | ICD-10-CM | POA: Insufficient documentation

## 2017-03-15 DIAGNOSIS — R55 Syncope and collapse: Secondary | ICD-10-CM

## 2017-03-15 DIAGNOSIS — I959 Hypotension, unspecified: Secondary | ICD-10-CM | POA: Diagnosis not present

## 2017-03-15 NOTE — Progress Notes (Signed)
   Subjective:   Patient ID: [name]    DOB: 10/11/1965, 51 y.o. male   MRN: [id number]  CC: "Falling out"  HPI: [name] is a 51 y.o. male who presents to clinic today for the following problems:  Syncope: patient states he has had 2 episodes of syncope in the last 3 months with the most recent being on 01/25/2017 resulting in multiple fractures and hospitalization. He has not had an episode since discharge but is concerned because his blood pressure was noted to be elevated during his home visits. He recently increased his spironolactone to 25 mg daily because of his hypertension. He is concerned today because his blood pressure is low consistent with his pressures back when he had a syncopal episode. Patient endorses urinary incontinence during the previous 2 episodes syncope. He denies any history of seizure. Patient denies changes to his HIV regimen. Patient states he is due for his pacemaker interrogation next month. ROS: denies fevers or chills, nausea or vomiting, shortness of breath, chest pain, palpitations,abdominal pain, headache, loss of motor function, loss of sensation, feelings of fatigue, change in vision.  Complete ROS performed, see HPI for pertinent.  PMFSH: HIV, treated chronic HCV, HTN, HFrEF w/ non-ischemic cardiomyopathy w/ ICD, PTSD, GERD, genital herpes, h/o EtOH abuse, THC use, severe protein-calorie malnutrition. Surgical history ORIF R-ankle, LHC, pacemaker placement. Family histroy lung CA. Smoking status reviewed. Medications reviewed.  Objective:   BP (!) 76/58 (BP Location: Right Arm, Patient Position: Sitting, Cuff Size: Normal)   Pulse 72   Temp 97.8 F (36.6 C)   Ht 5' 4.5" (1.638 m)   Wt 171 lb 3.2 oz (77.7 kg)   SpO2 94%   BMI 28.93 kg/m  Vitals and nursing note reviewed.  General: well nourished, well developed, in no acute distress with non-toxic appearance HEENT: normocephalic, atraumatic, moist mucous membranes Neck: supple,  non-tender without lymphadenopathy CV: regular rate and rhythm without murmurs, rubs, or gallops, no lower extremity edema Lungs: clear to auscultation bilaterally with normal work of breathing Abdomen: soft, non-tender, non-distended, no masses or organomegaly palpable, normoactive bowel sounds Skin: warm, dry, no rashes or lesions, cap refill < 2 seconds Extremities: warm and well perfused, normal tone  Assessment & Plan:   Syncope Acute. No recent syncopal episodes since hospital discharge on 01/25/2017. Patient currently hypotensive with normal rate though there is documentation of hypertension during last few visits. Discussed this with patient's cardiology group (PA, Erin) we were called patient's hypertension and recently increased spironolactone to 25 mg daily. Patient does endorse urinary incontinence with syncopal episodes however he denies history of seizures. --Patient to follow-up with Dr. [name] in one week and patient to call Dr. Ladona Ridgel for interrogation of pacemaker (due 04/2017) --Will decrease spirometry to 12.5 mg daily as instructed by patient's cardiology group --Would consider neurological workup if cardiology workup does not support syncope --RTC in 2 weeks   Orders Placed This Encounter  Procedures  . EKG 12-Lead  . EKG 12-Lead    Ordered by an unspecified provider    Meds ordered this encounter  Medications  . gabapentin (NEURONTIN) 300 MG capsule    Sig: Take 300 mg by mouth every evening.    Durward Parcel, DO [facility], PGY-2 03/15/2017 5:28 PM

## 2017-03-15 NOTE — Patient Instructions (Signed)
Thank you for coming in to see [place] today. Please see below to review our plan for today's visit.  1. Cut back your spironolactone to 12.5 mg daily. Drink plenty of fluids. 2. Follow-up with your Cardiologist Dr. [name] on 10/22 at 11:40 am.  3. Contact Dr. Lubertha Basque office to schedule your next appointment.  Please call the clinic at [phone] if your symptoms worsen or you have any concerns. It was my pleasure to see you. -- Durward Parcel, DO [facility], PGY-2

## 2017-03-15 NOTE — Assessment & Plan Note (Addendum)
Acute. No recent syncopal episodes since hospital discharge on 01/25/2017. Patient currently hypotensive with normal rate though there is documentation of hypertension during last few visits. Discussed this with patient's cardiology group (PA, Erin) we were called patient's hypertension and recently increased spironolactone to 25 mg daily. Patient does endorse urinary incontinence with syncopal episodes however he denies history of seizures. --Patient to follow-up with Dr. [name] in one week and patient to call Dr. Ladona Ridgel for interrogation of pacemaker (due 04/2017) --Will decrease spirometry to 12.5 mg daily as instructed by patient's cardiology group --Would consider neurological workup if cardiology workup does not support syncope --RTC in 2 weeks

## 2017-03-16 DIAGNOSIS — S62202D Unspecified fracture of first metacarpal bone, left hand, subsequent encounter for fracture with routine healing: Secondary | ICD-10-CM | POA: Diagnosis not present

## 2017-03-16 DIAGNOSIS — S82102D Unspecified fracture of upper end of left tibia, subsequent encounter for closed fracture with routine healing: Secondary | ICD-10-CM | POA: Diagnosis not present

## 2017-03-16 DIAGNOSIS — S82851D Displaced trimalleolar fracture of right lower leg, subsequent encounter for closed fracture with routine healing: Secondary | ICD-10-CM | POA: Diagnosis not present

## 2017-03-16 DIAGNOSIS — I5022 Chronic systolic (congestive) heart failure: Secondary | ICD-10-CM | POA: Diagnosis not present

## 2017-03-16 DIAGNOSIS — I429 Cardiomyopathy, unspecified: Secondary | ICD-10-CM | POA: Diagnosis not present

## 2017-03-16 DIAGNOSIS — I110 Hypertensive heart disease with heart failure: Secondary | ICD-10-CM | POA: Diagnosis not present

## 2017-03-18 DIAGNOSIS — I110 Hypertensive heart disease with heart failure: Secondary | ICD-10-CM | POA: Diagnosis not present

## 2017-03-18 DIAGNOSIS — I5022 Chronic systolic (congestive) heart failure: Secondary | ICD-10-CM | POA: Diagnosis not present

## 2017-03-18 DIAGNOSIS — I429 Cardiomyopathy, unspecified: Secondary | ICD-10-CM | POA: Diagnosis not present

## 2017-03-18 DIAGNOSIS — S82851D Displaced trimalleolar fracture of right lower leg, subsequent encounter for closed fracture with routine healing: Secondary | ICD-10-CM | POA: Diagnosis not present

## 2017-03-18 DIAGNOSIS — S82102D Unspecified fracture of upper end of left tibia, subsequent encounter for closed fracture with routine healing: Secondary | ICD-10-CM | POA: Diagnosis not present

## 2017-03-18 DIAGNOSIS — S62202D Unspecified fracture of first metacarpal bone, left hand, subsequent encounter for fracture with routine healing: Secondary | ICD-10-CM | POA: Diagnosis not present

## 2017-03-19 DIAGNOSIS — M25561 Pain in right knee: Secondary | ICD-10-CM | POA: Diagnosis not present

## 2017-03-22 ENCOUNTER — Encounter (HOSPITAL_COMMUNITY): Payer: Self-pay | Admitting: Internal Medicine

## 2017-03-22 ENCOUNTER — Ambulatory Visit (HOSPITAL_COMMUNITY)
Admission: RE | Admit: 2017-03-22 | Discharge: 2017-03-22 | Disposition: A | Discharge status: Home / Self Care | Payer: Medicare Other | Source: Ambulatory Visit | Attending: Internal Medicine | Admitting: Internal Medicine

## 2017-03-22 VITALS — BP 122/90 | HR 71 | Wt 176.4 lb

## 2017-03-22 DIAGNOSIS — Z79899 Other long term (current) drug therapy: Secondary | ICD-10-CM | POA: Diagnosis not present

## 2017-03-22 DIAGNOSIS — Z21 Asymptomatic human immunodeficiency virus [HIV] infection status: Secondary | ICD-10-CM | POA: Insufficient documentation

## 2017-03-22 DIAGNOSIS — I5022 Chronic systolic (congestive) heart failure: Secondary | ICD-10-CM | POA: Diagnosis not present

## 2017-03-22 DIAGNOSIS — Z95810 Presence of automatic (implantable) cardiac defibrillator: Secondary | ICD-10-CM | POA: Diagnosis not present

## 2017-03-22 DIAGNOSIS — I951 Orthostatic hypotension: Secondary | ICD-10-CM | POA: Diagnosis not present

## 2017-03-22 DIAGNOSIS — I428 Other cardiomyopathies: Secondary | ICD-10-CM | POA: Diagnosis not present

## 2017-03-22 DIAGNOSIS — D550 Anemia due to glucose-6-phosphate dehydrogenase [G6PD] deficiency: Secondary | ICD-10-CM | POA: Diagnosis not present

## 2017-03-22 DIAGNOSIS — K219 Gastro-esophageal reflux disease without esophagitis: Secondary | ICD-10-CM | POA: Diagnosis not present

## 2017-03-22 DIAGNOSIS — F329 Major depressive disorder, single episode, unspecified: Secondary | ICD-10-CM | POA: Insufficient documentation

## 2017-03-22 DIAGNOSIS — Z7982 Long term (current) use of aspirin: Secondary | ICD-10-CM | POA: Diagnosis not present

## 2017-03-22 DIAGNOSIS — I472 Ventricular tachycardia: Secondary | ICD-10-CM | POA: Insufficient documentation

## 2017-03-22 DIAGNOSIS — R55 Syncope and collapse: Secondary | ICD-10-CM | POA: Insufficient documentation

## 2017-03-22 DIAGNOSIS — Z801 Family history of malignant neoplasm of trachea, bronchus and lung: Secondary | ICD-10-CM | POA: Insufficient documentation

## 2017-03-22 DIAGNOSIS — I110 Hypertensive heart disease with heart failure: Secondary | ICD-10-CM | POA: Insufficient documentation

## 2017-03-22 NOTE — Progress Notes (Signed)
Patient ID: [name], male   DOB: Oct 29, 1965, 51 y.o.   MRN: [id number]   ADVANCED HEART FAILURE CLINIC  Patient ID: [name], male   DOB: 1965/08/21, 51 y.o.   MRN: [id number]  ---Medtronic Optivol ----   Primary Physician: [name], MD  Primary Cardiologist: Dr Ladona Ridgel  Primary HF: Dr. [name]    HPI: [name] is a 51 y.o. male with a history of HIV 1998, HCV, ETOH abuse and CHF due to NICM s/p Medtronic  CRT-D (06/2013). He also has a h/o syncope and previously had an EP study which was non-inducible for VT.    Was admitted 01/19/13- 01/22/13 for acute on chronic systolic HF. ECHO EF 20-25%. He was diuresed with IV lasix. He was discharged on coreg 6.25 mg BID, digoxin 0.25 mg daily, lisinopril 40 mg daily, and spironolactone 25 mg daily. Discharge weight 150 lbs. He stopped spironolactone due to severe headaches.  He was not able to tolerate titration of Coreg to 25 mg bid due to lightheadedness.   Admitted 8/27-8/31/18 for syncope resulting in trimalleolar fracture of the right ankle and left proximal tibia. He underwent ORIF of right trimalleolar fracture. He was discharged to SNF.   Today he returns to the HF clinic. Overall feeling ok. Denies dizziness/ syncope. Denies SOB. Says he had A baker's cyst and had to be drained due to boot. Now in ankle brace. Ambulates with a walker. Drinking at least 2 beers a day. Denies social drugs. He says he is confused about his medications. Appetite ok.  Followed by [facility] RN/PT.   ICD interrogated personally. Optivol: Volume status below threshold. Activity < 2 hours per day. No VT/A fib.   ECHO 04/2013: EF 20-25%, mod/severe MR, LA mod dilated ECHO 10/16/13 EF 20-25%  ECHO 03/14/15 EF 55% ECHO 12/17 EF 55% ECHO 8/18 EF 65-70%.    SH: Lives with partner in [place], drinking a wine cooler twice a week (prior heavy ETOH).   ROS: All systems negative except as listed in HPI, PMH and Problem List.  Family  History  Problem Relation Age of Onset  . Lung cancer Mother   . Colon cancer Unknown 50   Past Medical History:  Diagnosis Date  . CHF (congestive heart failure) (HCC)   . Chronic systolic heart failure (HCC)    a. Echo 12/05/11:  EF 20-25%, diff HK with mild sparing of IL wall, mild AI, mod MR, mild LAE, mild RVE, mild reduced RVF.;  b.  Echo 05/11/2012:  Mild LVH, EF 20-25%, Gr 1 diast dysfn, mod MR, mild LAE  . Depression   . G6PD deficiency (HCC)   . GERD (gastroesophageal reflux disease)   . Hepatitis    Hep B and Hep C (patient does not report this but these are listed in previous notes.)  . HIV infection (HCC)   . Hypertension   . NICM (nonischemic cardiomyopathy) (HCC)    cardia CTA 8/13 negative for obstructive CAD    Current Outpatient Prescriptions  Medication Sig Dispense Refill  . amiodarone (PACERONE) 200 MG tablet Take 1 tablet (200 mg total) by mouth daily. 30 tablet 11  . aspirin EC 325 MG tablet Take 1 tablet (325 mg total) by mouth daily. For 30 days post op for DVT Prophylaxis 30 tablet 0  . carvedilol (COREG) 3.125 MG tablet Take 1.56 mg by mouth 2 (two) times daily with a meal.    . clobetasol ointment (TEMOVATE) 0.05 % Apply 1 application  topically 2 (two) times daily.  0  . colchicine 0.6 MG tablet Take 1 tablet (0.6 mg total) by mouth 2 (two) times daily as needed. For gout flares. 30 tablet 2  . emtricitabine-rilpivir-tenofovir AF (ODEFSEY) 200-25-25 MG TABS tablet Take 1 tablet by mouth daily with breakfast. 30 tablet 5  . famotidine (PEPCID) 20 MG tablet Take 1 tablet (20 mg total) by mouth 2 (two) times daily. (Patient taking differently: Take 20 mg by mouth as needed. ) 30 tablet 0  . gabapentin (NEURONTIN) 300 MG capsule Take 300 mg by mouth every evening.    . hydrOXYzine (ATARAX/VISTARIL) 50 MG tablet take 1 tablet by mouth three times a day if needed (Patient taking differently: Take 50 mg by mouth 3 (three) times daily as needed for itching. take 1  tablet by mouth three times a day if needed) 15 tablet 1  . mirtazapine (REMERON) 7.5 MG tablet Take 1 tablet (7.5 mg total) by mouth at bedtime. 30 tablet 5  . oxyCODONE-acetaminophen (ROXICET) 5-325 MG tablet Take 1-2 tablets by mouth every 4 (four) hours as needed for severe pain. 40 tablet 0  . spironolactone (ALDACTONE) 25 MG tablet Take 1 tablet (25 mg total) by mouth at bedtime. (Patient taking differently: Take 12.5 mg by mouth at bedtime. ) 30 tablet 6  . TIVICAY 50 MG tablet take 1 tablet by mouth once daily WITH LUNCH 30 tablet 5  . triamcinolone cream (KENALOG) 0.1 % Apply 1 application topically 3 (three) times daily.  0  . valACYclovir (VALTREX) 1000 MG tablet Take 1 tablet (1,000 mg total) by mouth 2 (two) times daily as needed (outbreaks). Take for 5 days for an outbreak 10 tablet 6   No current facility-administered medications for this encounter.     Vitals:   03/22/17 1135  BP: 122/90  Pulse: 71  SpO2: 97%  Weight: 176 lb 6.4 oz (80 kg)    Physical Exam:  General:  Well appearing. No resp difficulty. Using a walker.  HEENT: normal Neck: supple. no JVD. Carotids 2+ bilat; no bruits. No lymphadenopathy or thryomegaly appreciated. Cor: PMI nondisplaced. Regular rate & rhythm. No rubs, gallops or murmurs. Lungs: clear Abdomen: soft, nontender, nondistended. No hepatosplenomegaly. No bruits or masses. Good bowel sounds. Extremities: no cyanosis, clubbing, rash, edema R ankle brace Neuro: alert & orientedx3, cranial nerves grossly intact. moves all 4 extremities w/o difficulty. Affect pleasant  ASSESSMENT & PLAN:  1) Chronic systolic HF: Nonischemic cardiomyopathy, ?due to ETOH abuse. EF 55% (03/2015) s/p Medtronic CRT-D (06/2013). 12/2016 ECHO EF 65-70%.  Optivol - NYHA  - Continue low dose Coreg 3.125 mg BID.  - Continue 12.5 mg spiro daily.   - On entresto 24-26 mg twice a day. Intolerant higher doses due to hypotension and presyncope.   2) Orthostatic  hypotension/Syncope - no VT by device interrogation -  3) Ventricular tachycardia:  - Continue Amio. No VT by device interrogation.  - discussed the need to yearly eye exams.   4) HIV-  Followed by Dr Luciana Axeomer    .  Follow up in 6 months.   Amy Clegg NP-C  03/22/2017 11:51 AM   Patient seen and examined with Tonye BecketAmy Clegg, NP. We discussed all aspects of the encounter. I agree with the assessment and plan as stated above.   Doing well. Volume status looks good on exam and ICD interrogation. Will continue current regimen. Watch BP on telemedicine, if going up will increase Entresto to 49/51. Otherwise will keep at  24/26 due to recent hypotension with fall.   [name], MD  12:26 PM

## 2017-03-22 NOTE — Patient Instructions (Signed)
We will contact you in 3 months to schedule your next appointment.  

## 2017-03-23 DIAGNOSIS — S82851D Displaced trimalleolar fracture of right lower leg, subsequent encounter for closed fracture with routine healing: Secondary | ICD-10-CM | POA: Diagnosis not present

## 2017-03-23 DIAGNOSIS — I429 Cardiomyopathy, unspecified: Secondary | ICD-10-CM | POA: Diagnosis not present

## 2017-03-23 DIAGNOSIS — S62202D Unspecified fracture of first metacarpal bone, left hand, subsequent encounter for fracture with routine healing: Secondary | ICD-10-CM | POA: Diagnosis not present

## 2017-03-23 DIAGNOSIS — I5022 Chronic systolic (congestive) heart failure: Secondary | ICD-10-CM | POA: Diagnosis not present

## 2017-03-23 DIAGNOSIS — I110 Hypertensive heart disease with heart failure: Secondary | ICD-10-CM | POA: Diagnosis not present

## 2017-03-23 DIAGNOSIS — S82102D Unspecified fracture of upper end of left tibia, subsequent encounter for closed fracture with routine healing: Secondary | ICD-10-CM | POA: Diagnosis not present

## 2017-03-25 DIAGNOSIS — S62202D Unspecified fracture of first metacarpal bone, left hand, subsequent encounter for fracture with routine healing: Secondary | ICD-10-CM | POA: Diagnosis not present

## 2017-03-25 DIAGNOSIS — I5022 Chronic systolic (congestive) heart failure: Secondary | ICD-10-CM | POA: Diagnosis not present

## 2017-03-25 DIAGNOSIS — I110 Hypertensive heart disease with heart failure: Secondary | ICD-10-CM | POA: Diagnosis not present

## 2017-03-25 DIAGNOSIS — I429 Cardiomyopathy, unspecified: Secondary | ICD-10-CM | POA: Diagnosis not present

## 2017-03-25 DIAGNOSIS — S82851D Displaced trimalleolar fracture of right lower leg, subsequent encounter for closed fracture with routine healing: Secondary | ICD-10-CM | POA: Diagnosis not present

## 2017-03-25 DIAGNOSIS — S82102D Unspecified fracture of upper end of left tibia, subsequent encounter for closed fracture with routine healing: Secondary | ICD-10-CM | POA: Diagnosis not present

## 2017-03-26 ENCOUNTER — Ambulatory Visit: Payer: Medicare Other

## 2017-03-26 DIAGNOSIS — S82102D Unspecified fracture of upper end of left tibia, subsequent encounter for closed fracture with routine healing: Secondary | ICD-10-CM | POA: Diagnosis not present

## 2017-03-26 DIAGNOSIS — S62202D Unspecified fracture of first metacarpal bone, left hand, subsequent encounter for fracture with routine healing: Secondary | ICD-10-CM | POA: Diagnosis not present

## 2017-03-26 DIAGNOSIS — I110 Hypertensive heart disease with heart failure: Secondary | ICD-10-CM | POA: Diagnosis not present

## 2017-03-26 DIAGNOSIS — S82851D Displaced trimalleolar fracture of right lower leg, subsequent encounter for closed fracture with routine healing: Secondary | ICD-10-CM | POA: Diagnosis not present

## 2017-03-26 DIAGNOSIS — S82145D Nondisplaced bicondylar fracture of left tibia, subsequent encounter for closed fracture with routine healing: Secondary | ICD-10-CM | POA: Diagnosis not present

## 2017-03-26 DIAGNOSIS — I429 Cardiomyopathy, unspecified: Secondary | ICD-10-CM | POA: Diagnosis not present

## 2017-03-26 DIAGNOSIS — I5022 Chronic systolic (congestive) heart failure: Secondary | ICD-10-CM | POA: Diagnosis not present

## 2017-03-30 DIAGNOSIS — S62202D Unspecified fracture of first metacarpal bone, left hand, subsequent encounter for fracture with routine healing: Secondary | ICD-10-CM | POA: Diagnosis not present

## 2017-03-30 DIAGNOSIS — I429 Cardiomyopathy, unspecified: Secondary | ICD-10-CM | POA: Diagnosis not present

## 2017-03-30 DIAGNOSIS — I110 Hypertensive heart disease with heart failure: Secondary | ICD-10-CM | POA: Diagnosis not present

## 2017-03-30 DIAGNOSIS — I5022 Chronic systolic (congestive) heart failure: Secondary | ICD-10-CM | POA: Diagnosis not present

## 2017-03-30 DIAGNOSIS — S82102D Unspecified fracture of upper end of left tibia, subsequent encounter for closed fracture with routine healing: Secondary | ICD-10-CM | POA: Diagnosis not present

## 2017-03-30 DIAGNOSIS — S82851D Displaced trimalleolar fracture of right lower leg, subsequent encounter for closed fracture with routine healing: Secondary | ICD-10-CM | POA: Diagnosis not present

## 2017-04-02 DIAGNOSIS — S62202D Unspecified fracture of first metacarpal bone, left hand, subsequent encounter for fracture with routine healing: Secondary | ICD-10-CM | POA: Diagnosis not present

## 2017-04-02 DIAGNOSIS — I5022 Chronic systolic (congestive) heart failure: Secondary | ICD-10-CM | POA: Diagnosis not present

## 2017-04-02 DIAGNOSIS — I429 Cardiomyopathy, unspecified: Secondary | ICD-10-CM | POA: Diagnosis not present

## 2017-04-02 DIAGNOSIS — I110 Hypertensive heart disease with heart failure: Secondary | ICD-10-CM | POA: Diagnosis not present

## 2017-04-02 DIAGNOSIS — S82851D Displaced trimalleolar fracture of right lower leg, subsequent encounter for closed fracture with routine healing: Secondary | ICD-10-CM | POA: Diagnosis not present

## 2017-04-02 DIAGNOSIS — S82102D Unspecified fracture of upper end of left tibia, subsequent encounter for closed fracture with routine healing: Secondary | ICD-10-CM | POA: Diagnosis not present

## 2017-04-05 ENCOUNTER — Other Ambulatory Visit: Payer: Self-pay | Admitting: Family Medicine

## 2017-04-05 ENCOUNTER — Other Ambulatory Visit: Payer: Self-pay | Admitting: *Deleted

## 2017-04-05 DIAGNOSIS — B20 Human immunodeficiency virus [HIV] disease: Secondary | ICD-10-CM

## 2017-04-05 DIAGNOSIS — Z113 Encounter for screening for infections with a predominantly sexual mode of transmission: Secondary | ICD-10-CM

## 2017-04-05 DIAGNOSIS — Z79899 Other long term (current) drug therapy: Secondary | ICD-10-CM

## 2017-04-05 MED ORDER — FAMOTIDINE 20 MG PO TABS: 20.0000 mg | ORAL_TABLET | Freq: Two times a day (BID) | ORAL | 2 refills | Status: DC

## 2017-04-06 DIAGNOSIS — I5022 Chronic systolic (congestive) heart failure: Secondary | ICD-10-CM | POA: Diagnosis not present

## 2017-04-06 DIAGNOSIS — S62202D Unspecified fracture of first metacarpal bone, left hand, subsequent encounter for fracture with routine healing: Secondary | ICD-10-CM | POA: Diagnosis not present

## 2017-04-06 DIAGNOSIS — I429 Cardiomyopathy, unspecified: Secondary | ICD-10-CM | POA: Diagnosis not present

## 2017-04-06 DIAGNOSIS — I110 Hypertensive heart disease with heart failure: Secondary | ICD-10-CM | POA: Diagnosis not present

## 2017-04-06 DIAGNOSIS — S82851D Displaced trimalleolar fracture of right lower leg, subsequent encounter for closed fracture with routine healing: Secondary | ICD-10-CM | POA: Diagnosis not present

## 2017-04-06 DIAGNOSIS — S82102D Unspecified fracture of upper end of left tibia, subsequent encounter for closed fracture with routine healing: Secondary | ICD-10-CM | POA: Diagnosis not present

## 2017-04-07 ENCOUNTER — Other Ambulatory Visit: Payer: Medicare Other

## 2017-04-07 ENCOUNTER — Ambulatory Visit: Payer: Medicare Other

## 2017-04-07 DIAGNOSIS — B20 Human immunodeficiency virus [HIV] disease: Secondary | ICD-10-CM

## 2017-04-07 DIAGNOSIS — Z113 Encounter for screening for infections with a predominantly sexual mode of transmission: Secondary | ICD-10-CM

## 2017-04-07 DIAGNOSIS — Z79899 Other long term (current) drug therapy: Secondary | ICD-10-CM | POA: Diagnosis not present

## 2017-04-08 DIAGNOSIS — S82851D Displaced trimalleolar fracture of right lower leg, subsequent encounter for closed fracture with routine healing: Secondary | ICD-10-CM | POA: Diagnosis not present

## 2017-04-08 DIAGNOSIS — I5022 Chronic systolic (congestive) heart failure: Secondary | ICD-10-CM | POA: Diagnosis not present

## 2017-04-08 DIAGNOSIS — I429 Cardiomyopathy, unspecified: Secondary | ICD-10-CM | POA: Diagnosis not present

## 2017-04-08 DIAGNOSIS — S82102D Unspecified fracture of upper end of left tibia, subsequent encounter for closed fracture with routine healing: Secondary | ICD-10-CM | POA: Diagnosis not present

## 2017-04-08 DIAGNOSIS — I110 Hypertensive heart disease with heart failure: Secondary | ICD-10-CM | POA: Diagnosis not present

## 2017-04-08 DIAGNOSIS — S62202D Unspecified fracture of first metacarpal bone, left hand, subsequent encounter for fracture with routine healing: Secondary | ICD-10-CM | POA: Diagnosis not present

## 2017-04-08 LAB — CBC WITH DIFFERENTIAL/PLATELET
BASOS ABS: 37 {cells}/uL (ref 0–200)
Basophils Relative: 0.5 %
EOS PCT: 1.9 %
Eosinophils Absolute: 139 cells/uL (ref 15–500)
HEMATOCRIT: 41.2 % (ref 38.5–50.0)
Hemoglobin: 14 g/dL (ref 13.2–17.1)
Lymphs Abs: 2497 cells/uL (ref 850–3900)
MCH: 29.1 pg (ref 27.0–33.0)
MCHC: 34 g/dL (ref 32.0–36.0)
MCV: 85.7 fL (ref 80.0–100.0)
MONOS PCT: 6.7 %
MPV: 11.3 fL (ref 7.5–12.5)
NEUTROS PCT: 56.7 %
Neutro Abs: 4139 cells/uL (ref 1500–7800)
PLATELETS: 169 10*3/uL (ref 140–400)
RBC: 4.81 10*6/uL (ref 4.20–5.80)
RDW: 12.1 % (ref 11.0–15.0)
TOTAL LYMPHOCYTE: 34.2 %
WBC mixed population: 489 cells/uL (ref 200–950)
WBC: 7.3 10*3/uL (ref 3.8–10.8)

## 2017-04-08 LAB — COMPLETE METABOLIC PANEL WITH GFR
AG Ratio: 1 (calc) (ref 1.0–2.5)
ALKALINE PHOSPHATASE (APISO): 150 U/L — AB (ref 40–115)
ALT: 37 U/L (ref 9–46)
AST: 44 U/L — AB (ref 10–35)
Albumin: 3.5 g/dL — ABNORMAL LOW (ref 3.6–5.1)
BUN: 8 mg/dL (ref 7–25)
CO2: 23 mmol/L (ref 20–32)
CREATININE: 1.05 mg/dL (ref 0.70–1.33)
Calcium: 8.8 mg/dL (ref 8.6–10.3)
Chloride: 109 mmol/L (ref 98–110)
GFR, Est African American: 93 mL/min/{1.73_m2} (ref >=60)
GFR, Est Non African American: 81 mL/min/{1.73_m2} (ref >=60)
GLOBULIN: 3.4 g/dL (ref 1.9–3.7)
GLUCOSE: 131 mg/dL — AB (ref 65–99)
Potassium: 3.9 mmol/L (ref 3.5–5.3)
SODIUM: 141 mmol/L (ref 135–146)
Total Bilirubin: 0.4 mg/dL (ref 0.2–1.2)
Total Protein: 6.9 g/dL (ref 6.1–8.1)

## 2017-04-08 LAB — LIPID PANEL
CHOL/HDL RATIO: 5 (calc) — AB (ref <5.0)
CHOLESTEROL: 129 mg/dL (ref <200)
HDL: 26 mg/dL — AB (ref >40)
LDL Cholesterol (Calc): 73 mg/dL (calc)
Non-HDL Cholesterol (Calc): 103 mg/dL (calc) (ref <130)
TRIGLYCERIDES: 208 mg/dL — AB (ref <150)

## 2017-04-08 LAB — T-HELPER CELL (CD4) - (RCID CLINIC ONLY)
CD4 % Helper T Cell: 19 % — ABNORMAL LOW (ref 33–55)
CD4 T CELL ABS: 550 /uL (ref 400–2700)

## 2017-04-08 LAB — RPR: RPR Ser Ql: NONREACTIVE

## 2017-04-09 LAB — HIV-1 RNA QUANT-NO REFLEX-BLD
HIV 1 RNA Quant: <20 copies/mL
HIV-1 RNA Quant, Log: <1.3 Log copies/mL

## 2017-04-12 ENCOUNTER — Other Ambulatory Visit: Payer: Self-pay | Admitting: Internal Medicine

## 2017-04-12 ENCOUNTER — Other Ambulatory Visit (HOSPITAL_COMMUNITY): Payer: Self-pay | Admitting: *Deleted

## 2017-04-12 MED ORDER — CARVEDILOL 3.125 MG PO TABS: 3.1250 mg | ORAL_TABLET | Freq: Two times a day (BID) | ORAL | 3 refills | Status: DC

## 2017-04-13 ENCOUNTER — Telehealth (HOSPITAL_COMMUNITY): Payer: Self-pay

## 2017-04-13 DIAGNOSIS — I429 Cardiomyopathy, unspecified: Secondary | ICD-10-CM | POA: Diagnosis not present

## 2017-04-13 DIAGNOSIS — S62202D Unspecified fracture of first metacarpal bone, left hand, subsequent encounter for fracture with routine healing: Secondary | ICD-10-CM | POA: Diagnosis not present

## 2017-04-13 DIAGNOSIS — S82102D Unspecified fracture of upper end of left tibia, subsequent encounter for closed fracture with routine healing: Secondary | ICD-10-CM | POA: Diagnosis not present

## 2017-04-13 DIAGNOSIS — S82851D Displaced trimalleolar fracture of right lower leg, subsequent encounter for closed fracture with routine healing: Secondary | ICD-10-CM | POA: Diagnosis not present

## 2017-04-13 DIAGNOSIS — I5022 Chronic systolic (congestive) heart failure: Secondary | ICD-10-CM | POA: Diagnosis not present

## 2017-04-13 DIAGNOSIS — I110 Hypertensive heart disease with heart failure: Secondary | ICD-10-CM | POA: Diagnosis not present

## 2017-04-13 MED ORDER — CARVEDILOL 25 MG PO TABS: 25.0000 mg | ORAL_TABLET | Freq: Two times a day (BID) | ORAL | 3 refills | Status: DC

## 2017-04-13 NOTE — Telephone Encounter (Signed)
Patient called CHF lcinic triage line to report his coreg was refilled at an incorrect dose. Per our records, patient is supposed to be on coreg 3.125 mg BID.  Patient states he has been doing coreg 25 mg bid and feels good, denies dizziness/lightheadedness. Per Dr. [name] new Rx sent in for coreg 25 mg bid and advised to continue this regimen.  Also advised to return call to clinic for any returning s/s of dizziness/lighthededness.  Ave Filter, RN

## 2017-04-14 ENCOUNTER — Telehealth: Payer: Self-pay | Admitting: Cardiology

## 2017-04-14 ENCOUNTER — Ambulatory Visit (INDEPENDENT_AMBULATORY_CARE_PROVIDER_SITE_OTHER): Payer: Medicare Other | Admitting: *Deleted

## 2017-04-14 DIAGNOSIS — I5022 Chronic systolic (congestive) heart failure: Secondary | ICD-10-CM | POA: Diagnosis not present

## 2017-04-14 DIAGNOSIS — I429 Cardiomyopathy, unspecified: Secondary | ICD-10-CM

## 2017-04-14 NOTE — Telephone Encounter (Signed)
Spoke with pt and reminded pt of remote transmission that is due today. Pt verbalized understanding.   

## 2017-04-15 DIAGNOSIS — I110 Hypertensive heart disease with heart failure: Secondary | ICD-10-CM | POA: Diagnosis not present

## 2017-04-15 DIAGNOSIS — S62202D Unspecified fracture of first metacarpal bone, left hand, subsequent encounter for fracture with routine healing: Secondary | ICD-10-CM | POA: Diagnosis not present

## 2017-04-15 DIAGNOSIS — S82851D Displaced trimalleolar fracture of right lower leg, subsequent encounter for closed fracture with routine healing: Secondary | ICD-10-CM | POA: Diagnosis not present

## 2017-04-15 DIAGNOSIS — I5022 Chronic systolic (congestive) heart failure: Secondary | ICD-10-CM | POA: Diagnosis not present

## 2017-04-15 DIAGNOSIS — S82102D Unspecified fracture of upper end of left tibia, subsequent encounter for closed fracture with routine healing: Secondary | ICD-10-CM | POA: Diagnosis not present

## 2017-04-15 DIAGNOSIS — I429 Cardiomyopathy, unspecified: Secondary | ICD-10-CM | POA: Diagnosis not present

## 2017-04-15 LAB — CUP PACEART REMOTE DEVICE CHECK
Battery Remaining Longevity: 35 mo
Battery Voltage: 2.96 V
Brady Statistic AS VP Percent: 98.45 %
Date Time Interrogation Session: 20181114194821
HIGH POWER IMPEDANCE MEASURED VALUE: 76 Ohm
Implantable Lead Implant Date: 20150126
Implantable Lead Implant Date: 20150126
Implantable Lead Location: 753858
Implantable Lead Location: 753859
Implantable Lead Model: 4396
Implantable Lead Model: 5076
Implantable Lead Model: 6935
Implantable Pulse Generator Implant Date: 20150126
Lead Channel Impedance Value: 399 Ohm
Lead Channel Impedance Value: 399 Ohm
Lead Channel Impedance Value: 399 Ohm
Lead Channel Pacing Threshold Amplitude: 0.75 V
Lead Channel Pacing Threshold Pulse Width: 0.4 ms
Lead Channel Pacing Threshold Pulse Width: 0.4 ms
Lead Channel Sensing Intrinsic Amplitude: 11.75 mV
Lead Channel Sensing Intrinsic Amplitude: 11.75 mV
Lead Channel Setting Pacing Amplitude: 2 V
Lead Channel Setting Pacing Amplitude: 2.5 V
Lead Channel Setting Pacing Pulse Width: 0.4 ms
MDC IDC LEAD IMPLANT DT: 20150126
MDC IDC LEAD LOCATION: 753860
MDC IDC MSMT LEADCHNL LV IMPEDANCE VALUE: 475 Ohm
MDC IDC MSMT LEADCHNL LV IMPEDANCE VALUE: 722 Ohm
MDC IDC MSMT LEADCHNL LV PACING THRESHOLD AMPLITUDE: 1 V
MDC IDC MSMT LEADCHNL RA PACING THRESHOLD AMPLITUDE: 0.625 V
MDC IDC MSMT LEADCHNL RA PACING THRESHOLD PULSEWIDTH: 0.4 ms
MDC IDC MSMT LEADCHNL RA SENSING INTR AMPL: 1.625 mV
MDC IDC MSMT LEADCHNL RA SENSING INTR AMPL: 1.625 mV
MDC IDC MSMT LEADCHNL RV IMPEDANCE VALUE: 475 Ohm
MDC IDC SET LEADCHNL RA PACING AMPLITUDE: 2 V
MDC IDC SET LEADCHNL RV PACING PULSEWIDTH: 0.4 ms
MDC IDC SET LEADCHNL RV SENSING SENSITIVITY: 0.3 mV
MDC IDC STAT BRADY AP VP PERCENT: 0.09 %
MDC IDC STAT BRADY AP VS PERCENT: 0.01 %
MDC IDC STAT BRADY AS VS PERCENT: 1.44 %
MDC IDC STAT BRADY RA PERCENT PACED: 0.11 %
MDC IDC STAT BRADY RV PERCENT PACED: 2.46 %

## 2017-04-15 NOTE — Progress Notes (Signed)
Remote ICD transmission.   

## 2017-04-16 ENCOUNTER — Encounter: Payer: Self-pay | Admitting: Cardiology

## 2017-04-16 DIAGNOSIS — I5022 Chronic systolic (congestive) heart failure: Secondary | ICD-10-CM | POA: Diagnosis not present

## 2017-04-16 DIAGNOSIS — S82851D Displaced trimalleolar fracture of right lower leg, subsequent encounter for closed fracture with routine healing: Secondary | ICD-10-CM | POA: Diagnosis not present

## 2017-04-16 DIAGNOSIS — S82102D Unspecified fracture of upper end of left tibia, subsequent encounter for closed fracture with routine healing: Secondary | ICD-10-CM | POA: Diagnosis not present

## 2017-04-16 DIAGNOSIS — I110 Hypertensive heart disease with heart failure: Secondary | ICD-10-CM | POA: Diagnosis not present

## 2017-04-16 DIAGNOSIS — S62202D Unspecified fracture of first metacarpal bone, left hand, subsequent encounter for fracture with routine healing: Secondary | ICD-10-CM | POA: Diagnosis not present

## 2017-04-16 DIAGNOSIS — I429 Cardiomyopathy, unspecified: Secondary | ICD-10-CM | POA: Diagnosis not present

## 2017-04-20 DIAGNOSIS — I5022 Chronic systolic (congestive) heart failure: Secondary | ICD-10-CM | POA: Diagnosis not present

## 2017-04-20 DIAGNOSIS — S62202D Unspecified fracture of first metacarpal bone, left hand, subsequent encounter for fracture with routine healing: Secondary | ICD-10-CM | POA: Diagnosis not present

## 2017-04-20 DIAGNOSIS — I110 Hypertensive heart disease with heart failure: Secondary | ICD-10-CM | POA: Diagnosis not present

## 2017-04-20 DIAGNOSIS — I429 Cardiomyopathy, unspecified: Secondary | ICD-10-CM | POA: Diagnosis not present

## 2017-04-20 DIAGNOSIS — S82851D Displaced trimalleolar fracture of right lower leg, subsequent encounter for closed fracture with routine healing: Secondary | ICD-10-CM | POA: Diagnosis not present

## 2017-04-20 DIAGNOSIS — S82102D Unspecified fracture of upper end of left tibia, subsequent encounter for closed fracture with routine healing: Secondary | ICD-10-CM | POA: Diagnosis not present

## 2017-04-21 DIAGNOSIS — I429 Cardiomyopathy, unspecified: Secondary | ICD-10-CM | POA: Diagnosis not present

## 2017-04-21 DIAGNOSIS — S82851D Displaced trimalleolar fracture of right lower leg, subsequent encounter for closed fracture with routine healing: Secondary | ICD-10-CM | POA: Diagnosis not present

## 2017-04-21 DIAGNOSIS — S82102D Unspecified fracture of upper end of left tibia, subsequent encounter for closed fracture with routine healing: Secondary | ICD-10-CM | POA: Diagnosis not present

## 2017-04-21 DIAGNOSIS — I5022 Chronic systolic (congestive) heart failure: Secondary | ICD-10-CM | POA: Diagnosis not present

## 2017-04-21 DIAGNOSIS — S62202D Unspecified fracture of first metacarpal bone, left hand, subsequent encounter for fracture with routine healing: Secondary | ICD-10-CM | POA: Diagnosis not present

## 2017-04-21 DIAGNOSIS — I110 Hypertensive heart disease with heart failure: Secondary | ICD-10-CM | POA: Diagnosis not present

## 2017-04-25 DIAGNOSIS — S62202D Unspecified fracture of first metacarpal bone, left hand, subsequent encounter for fracture with routine healing: Secondary | ICD-10-CM | POA: Diagnosis not present

## 2017-04-25 DIAGNOSIS — I110 Hypertensive heart disease with heart failure: Secondary | ICD-10-CM | POA: Diagnosis not present

## 2017-04-25 DIAGNOSIS — I5022 Chronic systolic (congestive) heart failure: Secondary | ICD-10-CM | POA: Diagnosis not present

## 2017-04-25 DIAGNOSIS — I429 Cardiomyopathy, unspecified: Secondary | ICD-10-CM | POA: Diagnosis not present

## 2017-04-25 DIAGNOSIS — S82102D Unspecified fracture of upper end of left tibia, subsequent encounter for closed fracture with routine healing: Secondary | ICD-10-CM | POA: Diagnosis not present

## 2017-04-25 DIAGNOSIS — S82851D Displaced trimalleolar fracture of right lower leg, subsequent encounter for closed fracture with routine healing: Secondary | ICD-10-CM | POA: Diagnosis not present

## 2017-04-27 DIAGNOSIS — S82851D Displaced trimalleolar fracture of right lower leg, subsequent encounter for closed fracture with routine healing: Secondary | ICD-10-CM | POA: Diagnosis not present

## 2017-04-27 DIAGNOSIS — S82102D Unspecified fracture of upper end of left tibia, subsequent encounter for closed fracture with routine healing: Secondary | ICD-10-CM | POA: Diagnosis not present

## 2017-04-27 DIAGNOSIS — S62202D Unspecified fracture of first metacarpal bone, left hand, subsequent encounter for fracture with routine healing: Secondary | ICD-10-CM | POA: Diagnosis not present

## 2017-04-27 DIAGNOSIS — I110 Hypertensive heart disease with heart failure: Secondary | ICD-10-CM | POA: Diagnosis not present

## 2017-04-27 DIAGNOSIS — I5022 Chronic systolic (congestive) heart failure: Secondary | ICD-10-CM | POA: Diagnosis not present

## 2017-04-27 DIAGNOSIS — I429 Cardiomyopathy, unspecified: Secondary | ICD-10-CM | POA: Diagnosis not present

## 2017-04-28 ENCOUNTER — Encounter: Payer: Self-pay | Admitting: Internal Medicine

## 2017-04-28 ENCOUNTER — Ambulatory Visit (INDEPENDENT_AMBULATORY_CARE_PROVIDER_SITE_OTHER): Payer: Medicare Other | Admitting: Internal Medicine

## 2017-04-28 ENCOUNTER — Other Ambulatory Visit: Payer: Self-pay | Admitting: Pharmacist

## 2017-04-28 VITALS — BP 161/105 | HR 71 | Temp 98.1°F | Wt 176.0 lb

## 2017-04-28 DIAGNOSIS — B182 Chronic viral hepatitis C: Secondary | ICD-10-CM

## 2017-04-28 DIAGNOSIS — B20 Human immunodeficiency virus [HIV] disease: Secondary | ICD-10-CM | POA: Diagnosis not present

## 2017-04-28 NOTE — Progress Notes (Signed)
   Subjective:    Patient ID: [name], male    DOB: 10/03/1965, 51 y.o.   MRN: [id number]  HPI Here for follow up of HIV On tivicay and Odefsey with history of resistance.  Labs confirm good compliance with a CD4 of 550 and viral load < 20.  Normal glucose, creat.  He recently broke his wrist, ankle and knee and underwent surgery by Dr. Renaye Rakers.  He went to rehab and is out, walking well.  Still with OT and home aide at home.  Takes his medication daily with no missed doses.   He has not had treatment for his hepatitis C in the past and had refused but interested now in treatment.  Last viral load was checked in 07/2016.  Ultrasound abdomen 7/18 without signs of cirrhosis.   For his CHF his last EF was 65-70%, doing well.  Monitoring his bp, weight and pulse at home and remote monitoring by home health.   Asking about refill of his aspirin.     Review of Systems  Constitutional: Negative for fatigue.  Gastrointestinal: Negative for diarrhea.  Skin: Negative for rash.  Neurological: Negative for dizziness.       Objective:   Physical Exam  Constitutional: He appears well-developed and well-nourished. No distress.  HENT:  Mouth/Throat: No oropharyngeal exudate.  Eyes: No scleral icterus.  Cardiovascular: Normal rate, regular rhythm and normal heart sounds.  No murmur heard. Pulmonary/Chest: Effort normal and breath sounds normal. No respiratory distress.  Skin: No rash noted.   SH: still with occasional alcohol       Assessment & Plan:

## 2017-04-28 NOTE — Assessment & Plan Note (Signed)
Doing well.  rtc 6 months.  

## 2017-04-28 NOTE — Assessment & Plan Note (Signed)
Now interested in treatment.   With his drug interactions, particularly amiodarone, can use Zepatier or Mavyret, though may increase amio level a small amount (the latter).  Will see if genotype1 with no resistance and consider zepatier, if not will use Mavyret and let his cardiologist/EP person know.   Will get genotype, fibrosure and elastography and begin process when results available.

## 2017-04-29 DIAGNOSIS — S62202D Unspecified fracture of first metacarpal bone, left hand, subsequent encounter for fracture with routine healing: Secondary | ICD-10-CM | POA: Diagnosis not present

## 2017-04-29 DIAGNOSIS — I110 Hypertensive heart disease with heart failure: Secondary | ICD-10-CM | POA: Diagnosis not present

## 2017-04-29 DIAGNOSIS — S82102D Unspecified fracture of upper end of left tibia, subsequent encounter for closed fracture with routine healing: Secondary | ICD-10-CM | POA: Diagnosis not present

## 2017-04-29 DIAGNOSIS — I5022 Chronic systolic (congestive) heart failure: Secondary | ICD-10-CM | POA: Diagnosis not present

## 2017-04-29 DIAGNOSIS — S82851D Displaced trimalleolar fracture of right lower leg, subsequent encounter for closed fracture with routine healing: Secondary | ICD-10-CM | POA: Diagnosis not present

## 2017-04-29 DIAGNOSIS — I429 Cardiomyopathy, unspecified: Secondary | ICD-10-CM | POA: Diagnosis not present

## 2017-04-29 LAB — PROTIME-INR
INR: 1
Prothrombin Time: 11.1 s (ref 9.0–11.5)

## 2017-05-03 LAB — HCV RNA NS5A DRUG RESISTANCE

## 2017-05-03 LAB — LIVER FIBROSIS, FIBROTEST-ACTITEST
ALPHA-2-MACROGLOBULIN: 552 mg/dL — AB (ref 106–279)
ALT: 61 U/L — ABNORMAL HIGH (ref 9–46)
Apolipoprotein A1: 133 mg/dL (ref 94–176)
BILIRUBIN: 0.5 mg/dL (ref 0.2–1.2)
FIBROSIS SCORE: 0.89
GGT: 283 U/L — ABNORMAL HIGH (ref 3–95)
HAPTOGLOBIN: 112 mg/dL (ref 43–212)
NECROINFLAMMAT ACT SCORE: 0.58
REFERENCE ID: 2228225

## 2017-05-04 DIAGNOSIS — S82851D Displaced trimalleolar fracture of right lower leg, subsequent encounter for closed fracture with routine healing: Secondary | ICD-10-CM | POA: Diagnosis not present

## 2017-05-04 DIAGNOSIS — I110 Hypertensive heart disease with heart failure: Secondary | ICD-10-CM | POA: Diagnosis not present

## 2017-05-04 DIAGNOSIS — S82102D Unspecified fracture of upper end of left tibia, subsequent encounter for closed fracture with routine healing: Secondary | ICD-10-CM | POA: Diagnosis not present

## 2017-05-04 DIAGNOSIS — I429 Cardiomyopathy, unspecified: Secondary | ICD-10-CM | POA: Diagnosis not present

## 2017-05-04 DIAGNOSIS — S62202D Unspecified fracture of first metacarpal bone, left hand, subsequent encounter for fracture with routine healing: Secondary | ICD-10-CM | POA: Diagnosis not present

## 2017-05-04 DIAGNOSIS — I5022 Chronic systolic (congestive) heart failure: Secondary | ICD-10-CM | POA: Diagnosis not present

## 2017-05-04 LAB — HEPATITIS C RNA QUANTITATIVE
HCV Quantitative Log: 7.49 Log IU/mL — ABNORMAL HIGH
HCV RNA, PCR, QN: 31100000 IU/mL — ABNORMAL HIGH

## 2017-05-04 LAB — HEPATITIS C GENOTYPE

## 2017-05-06 DIAGNOSIS — S82851D Displaced trimalleolar fracture of right lower leg, subsequent encounter for closed fracture with routine healing: Secondary | ICD-10-CM | POA: Diagnosis not present

## 2017-05-06 DIAGNOSIS — I5022 Chronic systolic (congestive) heart failure: Secondary | ICD-10-CM | POA: Diagnosis not present

## 2017-05-06 DIAGNOSIS — S82102D Unspecified fracture of upper end of left tibia, subsequent encounter for closed fracture with routine healing: Secondary | ICD-10-CM | POA: Diagnosis not present

## 2017-05-06 DIAGNOSIS — S62202D Unspecified fracture of first metacarpal bone, left hand, subsequent encounter for fracture with routine healing: Secondary | ICD-10-CM | POA: Diagnosis not present

## 2017-05-06 DIAGNOSIS — I110 Hypertensive heart disease with heart failure: Secondary | ICD-10-CM | POA: Diagnosis not present

## 2017-05-06 DIAGNOSIS — I429 Cardiomyopathy, unspecified: Secondary | ICD-10-CM | POA: Diagnosis not present

## 2017-05-07 DIAGNOSIS — S82145D Nondisplaced bicondylar fracture of left tibia, subsequent encounter for closed fracture with routine healing: Secondary | ICD-10-CM | POA: Diagnosis not present

## 2017-05-08 DIAGNOSIS — S82102D Unspecified fracture of upper end of left tibia, subsequent encounter for closed fracture with routine healing: Secondary | ICD-10-CM | POA: Diagnosis not present

## 2017-05-08 DIAGNOSIS — I429 Cardiomyopathy, unspecified: Secondary | ICD-10-CM | POA: Diagnosis not present

## 2017-05-08 DIAGNOSIS — S82851D Displaced trimalleolar fracture of right lower leg, subsequent encounter for closed fracture with routine healing: Secondary | ICD-10-CM | POA: Diagnosis not present

## 2017-05-08 DIAGNOSIS — S62202D Unspecified fracture of first metacarpal bone, left hand, subsequent encounter for fracture with routine healing: Secondary | ICD-10-CM | POA: Diagnosis not present

## 2017-05-08 DIAGNOSIS — I110 Hypertensive heart disease with heart failure: Secondary | ICD-10-CM | POA: Diagnosis not present

## 2017-05-08 DIAGNOSIS — I5022 Chronic systolic (congestive) heart failure: Secondary | ICD-10-CM | POA: Diagnosis not present

## 2017-05-11 ENCOUNTER — Emergency Department (HOSPITAL_COMMUNITY)
Admission: EM | Admit: 2017-05-11 | Discharge: 2017-05-12 | Disposition: A | Discharge status: Home / Self Care | Payer: Medicare Other | Attending: Emergency Medicine | Admitting: Emergency Medicine

## 2017-05-11 ENCOUNTER — Encounter (HOSPITAL_COMMUNITY): Payer: Self-pay | Admitting: Emergency Medicine

## 2017-05-11 DIAGNOSIS — Z79899 Other long term (current) drug therapy: Secondary | ICD-10-CM | POA: Insufficient documentation

## 2017-05-11 DIAGNOSIS — R404 Transient alteration of awareness: Secondary | ICD-10-CM | POA: Diagnosis not present

## 2017-05-11 DIAGNOSIS — R112 Nausea with vomiting, unspecified: Secondary | ICD-10-CM | POA: Insufficient documentation

## 2017-05-11 DIAGNOSIS — E860 Dehydration: Secondary | ICD-10-CM | POA: Diagnosis not present

## 2017-05-11 DIAGNOSIS — R55 Syncope and collapse: Secondary | ICD-10-CM | POA: Diagnosis not present

## 2017-05-11 DIAGNOSIS — I5022 Chronic systolic (congestive) heart failure: Secondary | ICD-10-CM | POA: Diagnosis not present

## 2017-05-11 DIAGNOSIS — B20 Human immunodeficiency virus [HIV] disease: Secondary | ICD-10-CM | POA: Diagnosis not present

## 2017-05-11 DIAGNOSIS — I110 Hypertensive heart disease with heart failure: Secondary | ICD-10-CM | POA: Diagnosis not present

## 2017-05-11 LAB — HEPATIC FUNCTION PANEL
ALT: 70 U/L — AB (ref 17–63)
AST: 93 U/L — AB (ref 15–41)
Albumin: 3.7 g/dL (ref 3.5–5.0)
Alkaline Phosphatase: 109 U/L (ref 38–126)
BILIRUBIN DIRECT: 0.2 mg/dL (ref 0.1–0.5)
Indirect Bilirubin: 0.4 mg/dL (ref 0.3–0.9)
TOTAL PROTEIN: 7.6 g/dL (ref 6.5–8.1)
Total Bilirubin: 0.6 mg/dL (ref 0.3–1.2)

## 2017-05-11 LAB — CBC
HCT: 40.5 % (ref 39.0–52.0)
Hemoglobin: 13.4 g/dL (ref 13.0–17.0)
MCH: 28.6 pg (ref 26.0–34.0)
MCHC: 33.1 g/dL (ref 30.0–36.0)
MCV: 86.4 fL (ref 78.0–100.0)
PLATELETS: 175 10*3/uL (ref 150–400)
RBC: 4.69 MIL/uL (ref 4.22–5.81)
RDW: 14.3 % (ref 11.5–15.5)
WBC: 8.2 10*3/uL (ref 4.0–10.5)

## 2017-05-11 LAB — CBG MONITORING, ED: Glucose-Capillary: 106 mg/dL — ABNORMAL HIGH (ref 65–99)

## 2017-05-11 LAB — I-STAT TROPONIN, ED: Troponin i, poc: 0 ng/mL (ref 0.00–0.08)

## 2017-05-11 LAB — BASIC METABOLIC PANEL
ANION GAP: 11 (ref 5–15)
BUN: 15 mg/dL (ref 6–20)
CHLORIDE: 102 mmol/L (ref 101–111)
CO2: 22 mmol/L (ref 22–32)
CREATININE: 1.34 mg/dL — AB (ref 0.61–1.24)
Calcium: 9.1 mg/dL (ref 8.9–10.3)
GFR calc non Af Amer: 59 mL/min — ABNORMAL LOW (ref >60)
Glucose, Bld: 90 mg/dL (ref 65–99)
POTASSIUM: 3.3 mmol/L — AB (ref 3.5–5.1)
Sodium: 135 mmol/L (ref 135–145)

## 2017-05-11 LAB — BRAIN NATRIURETIC PEPTIDE: B Natriuretic Peptide: 29.2 pg/mL (ref 0.0–100.0)

## 2017-05-11 MED ORDER — SODIUM CHLORIDE 0.9 % IV BOLUS (SEPSIS)
1000.0000 mL | Freq: Once | INTRAVENOUS | Status: AC
  Administered 2017-05-11: 1000 mL via INTRAVENOUS

## 2017-05-11 MED ORDER — SODIUM CHLORIDE 0.9 % IV BOLUS (SEPSIS)
500.0000 mL | Freq: Once | INTRAVENOUS | Status: AC
  Administered 2017-05-12: 500 mL via INTRAVENOUS

## 2017-05-11 NOTE — ED Notes (Signed)
Received call from [employer] with report of pt's interrogation report. Gave to [place], MD.

## 2017-05-11 NOTE — ED Notes (Signed)
Pt reports feeling dizzy and nauseous going from sitting to standing.

## 2017-05-11 NOTE — ED Provider Notes (Signed)
[facility] EMERGENCY DEPARTMENT Provider Note  CSN: [id number] Arrival date & time: 05/11/17 2027  Chief Complaint(s) Near Syncope  HPI [name] is a 51 y.o. male   The history is provided by the patient and the spouse.  Near Syncope  This is a recurrent problem. The current episode started 1 to 2 hours ago. Episode frequency: sporadic. The problem has been resolved. Pertinent negatives include no chest pain, no abdominal pain, no headaches and no shortness of breath. Nothing aggravates the symptoms. Nothing relieves the symptoms.   Endorses h/o nausea with emesis for several months. Endorses decreased oral hydration over the past couple of weeks.  Does admit to daily EtOH consumption (Approx 24 oz of beer per day).   Past Medical History Past Medical History:  Diagnosis Date  . CHF (congestive heart failure) (HCC)   . Chronic systolic heart failure (HCC)    a. Echo 12/05/11:  EF 20-25%, diff HK with mild sparing of IL wall, mild AI, mod MR, mild LAE, mild RVE, mild reduced RVF.;  b.  Echo 05/11/2012:  Mild LVH, EF 20-25%, Gr 1 diast dysfn, mod MR, mild LAE  . Depression   . G6PD deficiency (HCC)   . GERD (gastroesophageal reflux disease)   . Hepatitis    Hep B and Hep C (patient does not report this but these are listed in previous notes.)  . HIV infection (HCC)   . Hypertension   . NICM (nonischemic cardiomyopathy) (HCC)    cardia CTA 8/13 negative for obstructive CAD   Patient Active Problem List   Diagnosis Date Noted  . Nonischemic cardiomyopathy (HCC)   . Syncope 01/25/2017  . HIV (human immunodeficiency virus infection) (HCC) 01/25/2017  . Encounter for long-term (current) use of medications 03/21/2015  . Ventricular tachycardia, polymorphic (HCC) 02/03/2014  . PTSD (post-traumatic stress disorder) 02/03/2014  . Sustained VT (ventricular tachycardia) (HCC) 01/27/2014  . ICD (implantable cardioverter-defibrillator), biventricular, in situ  01/23/2014  . Paroxysmal ventricular fibrillation (HCC) 01/19/2014  . Protein-calorie malnutrition, severe (HCC) 02/13/2013  . GERD (gastroesophageal reflux disease) 08/19/2012  . Chronic systolic heart failure (HCC) 01/25/2012  . Wide-complex tachycardia (HCC) 01/07/2012  . Alcohol abuse 11/30/2011  . Marijuana abuse 11/30/2011  . Genital herpes 06/09/2006  . Chronic hepatitis C without hepatic coma (HCC) 06/09/2006  . Primary hypertension 06/09/2006   Home Medication(s) Prior to Admission medications   Medication Sig Start Date End Date Taking? Authorizing Provider  amiodarone (PACERONE) 200 MG tablet Take 1 tablet (200 mg total) by mouth daily. 04/15/16  Yes Marinus Maw, MD  carvedilol (COREG) 25 MG tablet Take 1 tablet (25 mg total) 2 (two) times daily with a meal by mouth. 04/13/17  Yes Bensimhon, Bevelyn Buckles, MD  clobetasol ointment (TEMOVATE) 0.05 % Apply 1 application topically 2 (two) times daily. 12/28/16  Yes [provider]  colchicine 0.6 MG tablet Take 1 tablet (0.6 mg total) by mouth 2 (two) times daily as needed. For gout flares. 12/25/14  Yes ComerBelia Heman, MD  emtricitabine-rilpivir-tenofovir AF (ODEFSEY) 200-25-25 MG TABS tablet Take 1 tablet by mouth daily with breakfast. 09/11/16  Yes Comer, [name], MD  famotidine (PEPCID) 20 MG tablet Take 1 tablet (20 mg total) 2 (two) times daily by mouth. 04/05/17  Yes Wendee Beavers, DO  gabapentin (NEURONTIN) 300 MG capsule Take 300 mg by mouth every evening. 03/03/17  Yes [provider]  hydrOXYzine (ATARAX/VISTARIL) 50 MG tablet take 1 tablet by mouth three  times a day if needed Patient taking differently: Take 50 mg by mouth 3 (three) times daily as needed for itching. take 1 tablet by mouth three times a day if needed 11/04/16  Yes Upstill, Shari, PA-C  mirtazapine (REMERON) 7.5 MG tablet Take 1 tablet (7.5 mg total) by mouth at bedtime. 07/02/16  Yes Comer, Belia Hemanobert W, MD  oxyCODONE-acetaminophen (ROXICET)  5-325 MG tablet Take 1-2 tablets by mouth every 4 (four) hours as needed for severe pain. 01/27/17  Yes Martensen, Lucretia KernHenry Calvin III, PA-C  sacubitril-valsartan (ENTRESTO) 24-26 MG Take 1 tablet by mouth 2 (two) times daily.   Yes [provider]  spironolactone (ALDACTONE) 25 MG tablet Take 1 tablet (25 mg total) by mouth at bedtime. Patient taking differently: Take 12.5 mg by mouth at bedtime.  02/05/17  Yes Little IshikawaSmith, Erin E, NP  TIVICAY 50 MG tablet take 1 tablet by mouth once daily WITH LUNCH 09/11/16  Yes Comer, Belia Hemanobert W, MD  triamcinolone cream (KENALOG) 0.1 % Apply 1 application topically 3 (three) times daily. 01/14/17  Yes [provider]  valACYclovir (VALTREX) 1000 MG tablet Take 1 tablet (1,000 mg total) by mouth 2 (two) times daily as needed (outbreaks). Take for 5 days for an outbreak 01/19/17  Yes Comer, Belia Hemanobert W, MD                                                                                                                                    Past Surgical History Past Surgical History:  Procedure Laterality Date  . BI-VENTRICULAR PACEMAKER INSERTION N/A 06/26/2013   Procedure: BI-VENTRICULAR PACEMAKER INSERTION (CRT-P);  Surgeon: Marinus MawGregg W Taylor, MD;  [facility];  Service: Cardiovascular;  Laterality: N/A;  . COLONOSCOPY WITH PROPOFOL N/A 08/14/2016   Procedure: COLONOSCOPY WITH PROPOFOL;  Surgeon: Jeani HawkingPatrick Hung, MD;  Location: [facility] ENDOSCOPY;  Service: Endoscopy;  Laterality: N/A;  . ELECTROPHYSIOLOGY STUDY N/A 02/19/2012   Procedure: ELECTROPHYSIOLOGY STUDY;  Surgeon: Marinus MawGregg W Taylor, MD;  [facility];  Service: Cardiovascular;  Laterality: N/A;  . ESOPHAGOGASTRODUODENOSCOPY  12/07/2011   Procedure: ESOPHAGOGASTRODUODENOSCOPY (EGD);  Surgeon: Meryl DareMalcolm T Stark, MD,FACG;  [facility];  Service: Endoscopy;  Laterality: N/A;  . FINGER SURGERY     Thumb laceration.    Marland Kitchen. LEFT HEART CATHETERIZATION WITH CORONARY ANGIOGRAM N/A 01/31/2014   Procedure: LEFT  HEART CATHETERIZATION WITH CORONARY ANGIOGRAM;  Surgeon: Iran OuchMuhammad A Arida, MD;  Location: [facility] CATH LAB;  Service: Cardiovascular;  Laterality: N/A;  . ORIF ANKLE FRACTURE Right 01/26/2017   Procedure: OPEN REDUCTION INTERNAL FIXATION (ORIF) ANKLE FRACTURE;  Surgeon: Sheral ApleyMurphy, Cicero D, MD;  Location: [facility] OR;  Service: Orthopedics;  Laterality: Right;   Family History Family History  Problem Relation Age of Onset  . Lung cancer Mother   . Colon cancer Unknown 4750    Social History Social History   Tobacco Use  . Smoking status: Never Smoker  . Smokeless  tobacco: Never Used  Substance Use Topics  . Alcohol use: Yes    Alcohol/week: 0.6 oz    Types: 1 Cans of beer per week    Comment: occ beer  . Drug use: Yes    Frequency: 7.0 times per week    Types: Marijuana    Comment: marijuana last used jan 2018   Allergies Bactrim  Review of Systems Review of Systems  Respiratory: Negative for shortness of breath.   Cardiovascular: Positive for near-syncope. Negative for chest pain.  Gastrointestinal: Negative for abdominal pain.  Neurological: Negative for headaches.   All other systems are reviewed and are negative for acute change except as noted in the HPI  Physical Exam Vital Signs  I have reviewed the triage vital signs BP 103/77 (BP Location: Left Arm)   Pulse 65   Temp 98 F (36.7 C) (Oral)   Resp 15   Ht 5\' 4"  (1.626 m)   Wt 79.8 kg (176 lb)   SpO2 97%   BMI 30.21 kg/m   Physical Exam  Constitutional: He is oriented to person, place, and time. He appears well-developed and well-nourished. No distress.  HENT:  Head: Normocephalic and atraumatic.  Nose: Nose normal.  Eyes: Conjunctivae and EOM are normal. Pupils are equal, round, and reactive to light. Right eye exhibits no discharge. Left eye exhibits no discharge. No scleral icterus.  Neck: Normal range of motion. Neck supple.  Cardiovascular: Normal rate and regular rhythm. Exam reveals no gallop and no friction  rub.  No murmur heard. Pulmonary/Chest: Effort normal and breath sounds normal. No stridor. No respiratory distress. He has no rales.  Abdominal: Soft. He exhibits no distension. There is no tenderness.  Musculoskeletal: He exhibits no edema or tenderness.  Neurological: He is alert and oriented to person, place, and time.  Skin: Skin is warm and dry. No rash noted. He is not diaphoretic. No erythema.  Psychiatric: He has a normal mood and affect.  Vitals reviewed.   ED Results and Treatments Labs (all labs ordered are listed, but only abnormal results are displayed) Labs Reviewed  BASIC METABOLIC PANEL - Abnormal; Notable for the following components:      Result Value   Potassium 3.3 (*)    Creatinine, Ser 1.34 (*)    GFR calc non Af Amer 59 (*)    All other components within normal limits  HEPATIC FUNCTION PANEL - Abnormal; Notable for the following components:   AST 93 (*)    ALT 70 (*)    All other components within normal limits  CBG MONITORING, ED - Abnormal; Notable for the following components:   Glucose-Capillary 106 (*)    All other components within normal limits  CBC  BRAIN NATRIURETIC PEPTIDE  I-STAT TROPONIN, ED                                                                                                                         EKG  EKG Interpretation  Date/Time:  Tuesday May 11 2017 20:50:03 EST Ventricular Rate:  66 PR Interval:    QRS Duration: 136 QT Interval:  495 QTC Calculation: 519 R Axis:   73 Text Interpretation:  Sinus rhythm Nonspecific intraventricular conduction delay no pacing Otherwise no significant change Confirmed by Drema Pry [phone]) on 05/11/2017 8:58:32 PM      Radiology No results found. Pertinent labs & imaging results that were available during my care of the patient were reviewed by me and considered in my medical decision making (see chart for details).  Medications Ordered in ED Medications  sodium chloride 0.9  % bolus 500 mL (not administered)  sodium chloride 0.9 % bolus 1,000 mL (0 mLs Intravenous Stopped 05/11/17 2315)                                                                                                                                    Procedures Procedures  (including critical care time)  Medical Decision Making / ED Course I have reviewed the nursing notes for this encounter and the patient's prior records (if available in EHR or on provided paperwork).    Patient with soft blood pressures here in the emergency department.  Orthostatics not performed due to the soft blood pressures.  Based on his history the patient's presentation is likely secondary to dehydration from decreased p.o. intake and the reported nausea and vomiting.  Labs are consistent with dehydration.  EKG without acute ischemic changes or dysrhythmias.  ICD interrogation without any acute episodes.  Patient provided with IV fluid and oral hydration with improved symptomatology.  Recommended increasing oral hydration and slow tapering cessation of alcohol consumption.   The patient appears reasonably screened and/or stabilized for discharge and I doubt any other medical condition or other [facility] requiring further screening, evaluation, or treatment in the ED at this time prior to discharge.  The patient is safe for discharge with strict return precautions.   Final Clinical Impression(s) / ED Diagnoses Final diagnoses:  Near syncope  Dehydration   Disposition: Discharge  Condition: Good  I have discussed the results, Dx and Tx plan with the patient and husband who expressed understanding and agree(s) with the plan. Discharge instructions discussed at great length. The patient and husband were given strict return precautions who verbalized understanding of the instructions. No further questions at time of discharge.    ED Discharge Orders    None       Follow Up: Primary care provider  Schedule an  appointment as soon as possible for a visit  As needed      This chart was dictated using voice recognition software.  Despite best efforts to proofread,  errors can occur which can change the documentation meaning.   Nira Conn, MD 05/11/17 [phone]

## 2017-05-11 NOTE — ED Triage Notes (Signed)
Pt BIB EMS from home. Watching movie with family when family reports the pt had 3-4 episodes of "starring off" for roughing 5sec each time. Pt denies LOC and states that he remembers the moments. States similar to episodes he had back in August that we related to hypotension. EMS reports unable to get manual BP on scene and first automated was 72/50. Placed pt in trendelenburg position and BP incr'd to 110/78. Pt also reports distended abdomen for the last month. Hx CHF, hepatitis.

## 2017-05-12 DIAGNOSIS — R55 Syncope and collapse: Secondary | ICD-10-CM | POA: Diagnosis not present

## 2017-05-12 NOTE — ED Notes (Signed)
Pt given ice water for oral hydration

## 2017-05-13 ENCOUNTER — Ambulatory Visit (HOSPITAL_COMMUNITY)
Admission: RE | Admit: 2017-05-13 | Discharge: 2017-05-13 | Disposition: A | Discharge status: Home / Self Care | Payer: Medicare Other | Source: Ambulatory Visit | Attending: Internal Medicine | Admitting: Internal Medicine

## 2017-05-13 ENCOUNTER — Other Ambulatory Visit: Payer: Self-pay | Admitting: Internal Medicine

## 2017-05-13 DIAGNOSIS — S82851D Displaced trimalleolar fracture of right lower leg, subsequent encounter for closed fracture with routine healing: Secondary | ICD-10-CM | POA: Diagnosis not present

## 2017-05-13 DIAGNOSIS — K8020 Calculus of gallbladder without cholecystitis without obstruction: Secondary | ICD-10-CM | POA: Insufficient documentation

## 2017-05-13 DIAGNOSIS — I429 Cardiomyopathy, unspecified: Secondary | ICD-10-CM | POA: Diagnosis not present

## 2017-05-13 DIAGNOSIS — S62202D Unspecified fracture of first metacarpal bone, left hand, subsequent encounter for fracture with routine healing: Secondary | ICD-10-CM | POA: Diagnosis not present

## 2017-05-13 DIAGNOSIS — S82102D Unspecified fracture of upper end of left tibia, subsequent encounter for closed fracture with routine healing: Secondary | ICD-10-CM | POA: Diagnosis not present

## 2017-05-13 DIAGNOSIS — K760 Fatty (change of) liver, not elsewhere classified: Secondary | ICD-10-CM | POA: Insufficient documentation

## 2017-05-13 DIAGNOSIS — I5022 Chronic systolic (congestive) heart failure: Secondary | ICD-10-CM | POA: Diagnosis not present

## 2017-05-13 DIAGNOSIS — B182 Chronic viral hepatitis C: Secondary | ICD-10-CM | POA: Diagnosis not present

## 2017-05-13 DIAGNOSIS — Q6102 Congenital multiple renal cysts: Secondary | ICD-10-CM | POA: Insufficient documentation

## 2017-05-13 DIAGNOSIS — I110 Hypertensive heart disease with heart failure: Secondary | ICD-10-CM | POA: Diagnosis not present

## 2017-05-13 MED ORDER — ELBASVIR-GRAZOPREVIR 50-100 MG PO TABS: 1.0000 | ORAL_TABLET | Freq: Every day | ORAL | 2 refills | Status: DC

## 2017-05-14 ENCOUNTER — Encounter: Payer: Self-pay | Admitting: Nurse Practitioner

## 2017-05-14 DIAGNOSIS — S82102D Unspecified fracture of upper end of left tibia, subsequent encounter for closed fracture with routine healing: Secondary | ICD-10-CM | POA: Diagnosis not present

## 2017-05-14 DIAGNOSIS — I110 Hypertensive heart disease with heart failure: Secondary | ICD-10-CM | POA: Diagnosis not present

## 2017-05-14 DIAGNOSIS — S82851D Displaced trimalleolar fracture of right lower leg, subsequent encounter for closed fracture with routine healing: Secondary | ICD-10-CM | POA: Diagnosis not present

## 2017-05-14 DIAGNOSIS — S62202D Unspecified fracture of first metacarpal bone, left hand, subsequent encounter for fracture with routine healing: Secondary | ICD-10-CM | POA: Diagnosis not present

## 2017-05-14 DIAGNOSIS — I429 Cardiomyopathy, unspecified: Secondary | ICD-10-CM | POA: Diagnosis not present

## 2017-05-14 DIAGNOSIS — I5022 Chronic systolic (congestive) heart failure: Secondary | ICD-10-CM | POA: Diagnosis not present

## 2017-05-17 ENCOUNTER — Encounter: Payer: Self-pay | Admitting: Pharmacy Technician

## 2017-05-17 ENCOUNTER — Other Ambulatory Visit: Payer: Self-pay | Admitting: *Deleted

## 2017-05-17 ENCOUNTER — Other Ambulatory Visit (HOSPITAL_COMMUNITY): Payer: Self-pay | Admitting: Cardiology

## 2017-05-17 ENCOUNTER — Other Ambulatory Visit: Payer: Self-pay | Admitting: Pharmacist

## 2017-05-17 MED ORDER — AMIODARONE HCL 200 MG PO TABS: 200.0000 mg | ORAL_TABLET | Freq: Every day | ORAL | 11 refills | Status: DC

## 2017-05-17 MED ORDER — AMIODARONE HCL 200 MG PO TABS: 200.0000 mg | ORAL_TABLET | Freq: Every day | ORAL | 0 refills | Status: DC

## 2017-05-17 MED FILL — ZEPATIER 50-100 MG TABLET: 50-100 | 28 days supply | Qty: 28 | Fill #0

## 2017-05-18 DIAGNOSIS — S62202D Unspecified fracture of first metacarpal bone, left hand, subsequent encounter for fracture with routine healing: Secondary | ICD-10-CM | POA: Diagnosis not present

## 2017-05-18 DIAGNOSIS — S82851D Displaced trimalleolar fracture of right lower leg, subsequent encounter for closed fracture with routine healing: Secondary | ICD-10-CM | POA: Diagnosis not present

## 2017-05-18 DIAGNOSIS — I110 Hypertensive heart disease with heart failure: Secondary | ICD-10-CM | POA: Diagnosis not present

## 2017-05-18 DIAGNOSIS — I5022 Chronic systolic (congestive) heart failure: Secondary | ICD-10-CM | POA: Diagnosis not present

## 2017-05-18 DIAGNOSIS — S82102D Unspecified fracture of upper end of left tibia, subsequent encounter for closed fracture with routine healing: Secondary | ICD-10-CM | POA: Diagnosis not present

## 2017-05-18 DIAGNOSIS — I429 Cardiomyopathy, unspecified: Secondary | ICD-10-CM | POA: Diagnosis not present

## 2017-05-18 NOTE — Progress Notes (Signed)
Electrophysiology Office Note Date: 05/20/2017  ID:  [name], DOB 1965/07/23, MRN [id number]  PCP: Wendee BeaversMcMullen, David J, DO Primary Cardiologist: Bensimhon Electrophysiologist: Ladona Ridgelaylor  CC: Routine ICD follow-up  [name] is a 51 y.o. male seen today for Dr Ladona Ridgelaylor.  He presents today for routine electrophysiology followup.  Since last being seen in our clinic, the patient reports doing very well. He is getting ready to start treatment for Hepatitis C. He denies chest pain, palpitations, dyspnea, PND, orthopnea, nausea, vomiting, dizziness, syncope, edema, weight gain, or early satiety.  He has not had ICD shocks.   Device History: MDT CRTD implanted 2015 for VT, HF History of appropriate therapy: Yes History of AAD therapy: Yes - amiodarone    Past Medical History:  Diagnosis Date  . CHF (congestive heart failure) (HCC)   . Chronic systolic heart failure (HCC)    a. Echo 12/05/11:  EF 20-25%, diff HK with mild sparing of IL wall, mild AI, mod MR, mild LAE, mild RVE, mild reduced RVF.;  b.  Echo 05/11/2012:  Mild LVH, EF 20-25%, Gr 1 diast dysfn, mod MR, mild LAE  . Depression   . G6PD deficiency (HCC)   . GERD (gastroesophageal reflux disease)   . Hepatitis    Hep B and Hep C (patient does not report this but these are listed in previous notes.)  . HIV infection (HCC)   . Hypertension   . NICM (nonischemic cardiomyopathy) (HCC)    cardia CTA 8/13 negative for obstructive CAD  . VT (ventricular tachycardia) (HCC)    Past Surgical History:  Procedure Laterality Date  . BI-VENTRICULAR PACEMAKER INSERTION N/A 06/26/2013   Procedure: BI-VENTRICULAR PACEMAKER INSERTION (CRT-P);  Surgeon: Marinus MawGregg W Taylor, MD;  [facility];  Service: Cardiovascular;  Laterality: N/A;  . COLONOSCOPY WITH PROPOFOL N/A 08/14/2016   Procedure: COLONOSCOPY WITH PROPOFOL;  Surgeon: Jeani HawkingPatrick Hung, MD;  Location: [facility] ENDOSCOPY;  Service: Endoscopy;  Laterality: N/A;  . ELECTROPHYSIOLOGY  STUDY N/A 02/19/2012   Procedure: ELECTROPHYSIOLOGY STUDY;  Surgeon: Marinus MawGregg W Taylor, MD;  [facility];  Service: Cardiovascular;  Laterality: N/A;  . ESOPHAGOGASTRODUODENOSCOPY  12/07/2011   Procedure: ESOPHAGOGASTRODUODENOSCOPY (EGD);  Surgeon: Meryl DareMalcolm T Stark, MD,FACG;  [facility];  Service: Endoscopy;  Laterality: N/A;  . FINGER SURGERY     Thumb laceration.    Marland Kitchen. LEFT HEART CATHETERIZATION WITH CORONARY ANGIOGRAM N/A 01/31/2014   Procedure: LEFT HEART CATHETERIZATION WITH CORONARY ANGIOGRAM;  Surgeon: Iran OuchMuhammad A Arida, MD;  Location: [facility] CATH LAB;  Service: Cardiovascular;  Laterality: N/A;  . ORIF ANKLE FRACTURE Right 01/26/2017   Procedure: OPEN REDUCTION INTERNAL FIXATION (ORIF) ANKLE FRACTURE;  Surgeon: Sheral ApleyMurphy, Jurgen D, MD;  Location: [facility] OR;  Service: Orthopedics;  Laterality: Right;    Current Outpatient Medications  Medication Sig Dispense Refill  . amiodarone (PACERONE) 200 MG tablet Take 1 tablet (200 mg total) by mouth daily. 30 tablet 11  . carvedilol (COREG) 25 MG tablet Take 1 tablet (25 mg total) 2 (two) times daily with a meal by mouth. 180 tablet 3  . clobetasol ointment (TEMOVATE) 0.05 % Apply 1 application topically 2 (two) times daily.  0  . colchicine 0.6 MG tablet Take 1 tablet (0.6 mg total) by mouth 2 (two) times daily as needed. For gout flares. 30 tablet 2  . Elbasvir-Grazoprevir (ZEPATIER) 50-100 MG TABS Take 1 tablet by mouth daily. 28 tablet 2  . emtricitabine-rilpivir-tenofovir AF (ODEFSEY) 200-25-25 MG TABS tablet Take 1 tablet  by mouth daily with breakfast. 30 tablet 5  . famotidine (PEPCID) 20 MG tablet Take 1 tablet (20 mg total) 2 (two) times daily by mouth. 60 tablet 2  . gabapentin (NEURONTIN) 300 MG capsule Take 300 mg by mouth every evening.    . hydrOXYzine (ATARAX/VISTARIL) 50 MG tablet take 1 tablet by mouth three times a day if needed (Patient taking differently: Take 50 mg by mouth 3 (three) times daily as needed for itching. take 1  tablet by mouth three times a day if needed) 15 tablet 1  . mirtazapine (REMERON) 7.5 MG tablet Take 1 tablet (7.5 mg total) by mouth at bedtime. 30 tablet 5  . ondansetron (ZOFRAN) 4 MG tablet Take 4 mg by mouth as needed.  0  . oxyCODONE-acetaminophen (ROXICET) 5-325 MG tablet Take 1-2 tablets by mouth every 4 (four) hours as needed for severe pain. 40 tablet 0  . sacubitril-valsartan (ENTRESTO) 24-26 MG Take 1 tablet by mouth 2 (two) times daily.    Marland Kitchen spironolactone (ALDACTONE) 25 MG tablet Take 1 tablet (25 mg total) by mouth at bedtime. (Patient taking differently: Take 12.5 mg by mouth at bedtime. ) 30 tablet 6  . TIVICAY 50 MG tablet take 1 tablet by mouth once daily WITH LUNCH 30 tablet 5  . triamcinolone cream (KENALOG) 0.1 % Apply 1 application topically 3 (three) times daily.  0  . valACYclovir (VALTREX) 1000 MG tablet Take 1 tablet (1,000 mg total) by mouth 2 (two) times daily as needed (outbreaks). Take for 5 days for an outbreak 10 tablet 6   No current facility-administered medications for this visit.     Allergies:   Bactrim   Social History: Social History   Socioeconomic History  . Marital status: Single    Spouse name: Not on file  . Number of children: Not on file  . Years of education: Not on file  . Highest education level: Not on file  Social Needs  . Financial resource strain: Not on file  . Food insecurity - worry: Not on file  . Food insecurity - inability: Not on file  . Transportation needs - medical: Not on file  . Transportation needs - non-medical: Not on file  Occupational History  . Occupation: Unemployed  Tobacco Use  . Smoking status: Never Smoker  . Smokeless tobacco: Never Used  Substance and Sexual Activity  . Alcohol use: Yes    Alcohol/week: 0.6 oz    Types: 1 Cans of beer per week    Comment: occ beer  . Drug use: Yes    Frequency: 7.0 times per week    Types: Marijuana    Comment: marijuana last used jan 2018  . Sexual activity:  Yes    Partners: Male    Comment: 35 year relationship, givencondoms  Other Topics Concern  . Not on file  Social History Narrative   Lives alone.  Drinks beer daily.  Liquor rarely.  He occasionally smokes marijuana..    Family History: Family History  Problem Relation Age of Onset  . Lung cancer Mother   . Colon cancer Unknown 50    Review of Systems: All other systems reviewed and are otherwise negative except as noted above.   Physical Exam: VS:  BP 122/64   Pulse 68   Ht 5\' 4"  (1.626 m)   Wt 178 lb 12.8 oz (81.1 kg)   SpO2 96%   BMI 30.69 kg/m  , BMI Body mass index is 30.69 kg/m.  GEN- The patient is well appearing, alert and oriented x 3 today.   HEENT: normocephalic, atraumatic; sclera clear, conjunctiva pink; hearing intact; oropharynx clear; neck supple Lungs- Clear to ausculation bilaterally, normal work of breathing.  No wheezes, rales, rhonchi Heart- Regular rate and rhythm GI- soft, non-tender, non-distended, bowel sounds present Extremities- no clubbing, cyanosis, or edema MS- no significant deformity or atrophy Skin- warm and dry, no rash or lesion; ICD pocket well healed Psych- euthymic mood, full affect Neuro- strength and sensation are intact  ICD interrogation- reviewed in detail today,  See PACEART report  EKG:  EKG is not ordered today.  Recent Labs: 01/26/2017: Magnesium 2.0 05/11/2017: ALT 70; B Natriuretic Peptide 29.2; BUN 15; Creatinine, Ser 1.34; Hemoglobin 13.4; Platelets 175; Potassium 3.3; Sodium 135   Wt Readings from Last 3 Encounters:  05/20/17 178 lb 12.8 oz (81.1 kg)  05/11/17 176 lb (79.8 kg)  04/28/17 176 lb (79.8 kg)     Other studies Reviewed: Additional studies/ records that were reviewed today include: AHF notes, Dr Lubertha Basque office notes  Assessment and Plan:  1.  Chronic systolic dysfunction euvolemic today Stable on an appropriate medical regimen Normal ICD function See Pace Art report No changes today  2.   VT No recurrence in last 2 years We discussed decreasing dose to 100mg  daily. He would like to continue at current dose. He has a fair amount of anxiety around recurrent tachycardia.   3.  Orthostatic hypotension Limits up-titration of meds  4.  HIV Followed by ID    Current medicines are reviewed at length with the patient today.   The patient does not have concerns regarding his medicines.  The following changes were made today:  none  Labs/ tests ordered today include: none No orders of the defined types were placed in this encounter.    Disposition:   Follow up with [facility], AHF as scheduled, Dr Ladona Ridgel 1 year     Signed, Gypsy Balsam, NP 05/20/2017 11:48 AM  [facility] [address] [place] [address] [phone] (office) [phone] (fax)

## 2017-05-20 ENCOUNTER — Ambulatory Visit (INDEPENDENT_AMBULATORY_CARE_PROVIDER_SITE_OTHER): Payer: Medicare Other | Admitting: Nurse Practitioner

## 2017-05-20 ENCOUNTER — Encounter: Payer: Self-pay | Admitting: Nurse Practitioner

## 2017-05-20 VITALS — BP 122/64 | HR 68 | Ht 64.0 in | Wt 178.8 lb

## 2017-05-20 DIAGNOSIS — I472 Ventricular tachycardia, unspecified: Secondary | ICD-10-CM

## 2017-05-20 DIAGNOSIS — I5022 Chronic systolic (congestive) heart failure: Secondary | ICD-10-CM | POA: Diagnosis not present

## 2017-05-20 DIAGNOSIS — I951 Orthostatic hypotension: Secondary | ICD-10-CM

## 2017-05-20 DIAGNOSIS — B20 Human immunodeficiency virus [HIV] disease: Secondary | ICD-10-CM | POA: Diagnosis not present

## 2017-05-20 MED ORDER — AMIODARONE HCL 200 MG PO TABS: 200.0000 mg | ORAL_TABLET | Freq: Every day | ORAL | 11 refills | Status: DC

## 2017-05-20 NOTE — Patient Instructions (Addendum)
Medication Instructions:   Your physician recommends that you continue on your current medications as directed. Please refer to the Current Medication list given to you today.   If you need a refill on your cardiac medications before your next appointment, please call your pharmacy.  Labwork: NONE ORDERED  TODAY    Testing/Procedures:  NONE ORDERED  TODAY    Follow-Up:  Your physician wants you to follow-up in: ONE YEAR WITH [name]   You will receive a reminder letter in the mail two months in advance. If you don't receive a letter, please call our office to schedule the follow-up appointment.      Remote monitoring is used to monitor your Pacemaker of ICD from home. This monitoring reduces the number of office visits required to check your device to one time per year. It allows [place] to keep an eye on the functioning of your device to ensure it is working properly. You are scheduled for a device check from home on . 2-13 -2019 You may send your transmission at any time that day. If you have a wireless device, the transmission will be sent automatically. After your physician reviews your transmission, you will receive a postcard with your next transmission date.     Any Other Special Instructions Will Be Listed Below (If Applicable).

## 2017-05-21 DIAGNOSIS — S82851D Displaced trimalleolar fracture of right lower leg, subsequent encounter for closed fracture with routine healing: Secondary | ICD-10-CM | POA: Diagnosis not present

## 2017-05-21 DIAGNOSIS — I110 Hypertensive heart disease with heart failure: Secondary | ICD-10-CM | POA: Diagnosis not present

## 2017-05-21 DIAGNOSIS — I429 Cardiomyopathy, unspecified: Secondary | ICD-10-CM | POA: Diagnosis not present

## 2017-05-21 DIAGNOSIS — S82102D Unspecified fracture of upper end of left tibia, subsequent encounter for closed fracture with routine healing: Secondary | ICD-10-CM | POA: Diagnosis not present

## 2017-05-21 DIAGNOSIS — I5022 Chronic systolic (congestive) heart failure: Secondary | ICD-10-CM | POA: Diagnosis not present

## 2017-05-21 DIAGNOSIS — S62202D Unspecified fracture of first metacarpal bone, left hand, subsequent encounter for fracture with routine healing: Secondary | ICD-10-CM | POA: Diagnosis not present

## 2017-05-21 LAB — CUP PACEART INCLINIC DEVICE CHECK
Implantable Lead Implant Date: 20150126
Implantable Lead Location: 753860
Implantable Lead Model: 4396
Implantable Lead Model: 5076
MDC IDC LEAD IMPLANT DT: 20150126
MDC IDC LEAD IMPLANT DT: 20150126
MDC IDC LEAD LOCATION: 753858
MDC IDC LEAD LOCATION: 753859
MDC IDC PG IMPLANT DT: 20150126
MDC IDC SESS DTM: 20181221062416

## 2017-05-25 ENCOUNTER — Other Ambulatory Visit: Payer: Self-pay | Admitting: Internal Medicine

## 2017-05-25 DIAGNOSIS — B20 Human immunodeficiency virus [HIV] disease: Secondary | ICD-10-CM

## 2017-05-27 DIAGNOSIS — I5022 Chronic systolic (congestive) heart failure: Secondary | ICD-10-CM | POA: Diagnosis not present

## 2017-05-27 DIAGNOSIS — I110 Hypertensive heart disease with heart failure: Secondary | ICD-10-CM | POA: Diagnosis not present

## 2017-05-27 DIAGNOSIS — S62202D Unspecified fracture of first metacarpal bone, left hand, subsequent encounter for fracture with routine healing: Secondary | ICD-10-CM | POA: Diagnosis not present

## 2017-05-27 DIAGNOSIS — I429 Cardiomyopathy, unspecified: Secondary | ICD-10-CM | POA: Diagnosis not present

## 2017-05-27 DIAGNOSIS — S82102D Unspecified fracture of upper end of left tibia, subsequent encounter for closed fracture with routine healing: Secondary | ICD-10-CM | POA: Diagnosis not present

## 2017-05-27 DIAGNOSIS — S82851D Displaced trimalleolar fracture of right lower leg, subsequent encounter for closed fracture with routine healing: Secondary | ICD-10-CM | POA: Diagnosis not present

## 2017-06-03 DIAGNOSIS — S82851D Displaced trimalleolar fracture of right lower leg, subsequent encounter for closed fracture with routine healing: Secondary | ICD-10-CM | POA: Diagnosis not present

## 2017-06-03 DIAGNOSIS — I5022 Chronic systolic (congestive) heart failure: Secondary | ICD-10-CM | POA: Diagnosis not present

## 2017-06-03 DIAGNOSIS — I110 Hypertensive heart disease with heart failure: Secondary | ICD-10-CM | POA: Diagnosis not present

## 2017-06-03 DIAGNOSIS — S62202D Unspecified fracture of first metacarpal bone, left hand, subsequent encounter for fracture with routine healing: Secondary | ICD-10-CM | POA: Diagnosis not present

## 2017-06-03 DIAGNOSIS — S82102D Unspecified fracture of upper end of left tibia, subsequent encounter for closed fracture with routine healing: Secondary | ICD-10-CM | POA: Diagnosis not present

## 2017-06-03 DIAGNOSIS — I429 Cardiomyopathy, unspecified: Secondary | ICD-10-CM | POA: Diagnosis not present

## 2017-06-09 ENCOUNTER — Encounter: Payer: Self-pay | Admitting: Pharmacy Technician

## 2017-06-09 ENCOUNTER — Ambulatory Visit (INDEPENDENT_AMBULATORY_CARE_PROVIDER_SITE_OTHER): Payer: Medicare Other | Admitting: Pharmacist

## 2017-06-09 DIAGNOSIS — B182 Chronic viral hepatitis C: Secondary | ICD-10-CM | POA: Diagnosis not present

## 2017-06-09 DIAGNOSIS — B20 Human immunodeficiency virus [HIV] disease: Secondary | ICD-10-CM

## 2017-06-09 NOTE — Progress Notes (Signed)
[name] originally came here for a HIV and Hep C follow-up appointment, but he only started his Zepatier 2 days ago on 1/7.  I will reschedule him for 4-5 weeks to get labs and see how he is doing on the medication.  Markela Wee L. Clarisa Danser, PharmD, AAHIVP, CPP Infectious Diseases Clinical Pharmacist [facility] 06/09/2017, 4:35 PM

## 2017-06-10 DIAGNOSIS — S62202D Unspecified fracture of first metacarpal bone, left hand, subsequent encounter for fracture with routine healing: Secondary | ICD-10-CM | POA: Diagnosis not present

## 2017-06-10 DIAGNOSIS — S82851D Displaced trimalleolar fracture of right lower leg, subsequent encounter for closed fracture with routine healing: Secondary | ICD-10-CM | POA: Diagnosis not present

## 2017-06-10 DIAGNOSIS — I110 Hypertensive heart disease with heart failure: Secondary | ICD-10-CM | POA: Diagnosis not present

## 2017-06-10 DIAGNOSIS — S82102D Unspecified fracture of upper end of left tibia, subsequent encounter for closed fracture with routine healing: Secondary | ICD-10-CM | POA: Diagnosis not present

## 2017-06-10 DIAGNOSIS — I5022 Chronic systolic (congestive) heart failure: Secondary | ICD-10-CM | POA: Diagnosis not present

## 2017-06-10 DIAGNOSIS — I429 Cardiomyopathy, unspecified: Secondary | ICD-10-CM | POA: Diagnosis not present

## 2017-06-14 MED FILL — ZEPATIER 50-100 MG TABLET: 50-100 | 28 days supply | Qty: 28 | Fill #1

## 2017-06-16 DIAGNOSIS — I429 Cardiomyopathy, unspecified: Secondary | ICD-10-CM | POA: Diagnosis not present

## 2017-06-16 DIAGNOSIS — I5022 Chronic systolic (congestive) heart failure: Secondary | ICD-10-CM | POA: Diagnosis not present

## 2017-06-16 DIAGNOSIS — S62202D Unspecified fracture of first metacarpal bone, left hand, subsequent encounter for fracture with routine healing: Secondary | ICD-10-CM | POA: Diagnosis not present

## 2017-06-16 DIAGNOSIS — I110 Hypertensive heart disease with heart failure: Secondary | ICD-10-CM | POA: Diagnosis not present

## 2017-06-16 DIAGNOSIS — S82851D Displaced trimalleolar fracture of right lower leg, subsequent encounter for closed fracture with routine healing: Secondary | ICD-10-CM | POA: Diagnosis not present

## 2017-06-16 DIAGNOSIS — S82102D Unspecified fracture of upper end of left tibia, subsequent encounter for closed fracture with routine healing: Secondary | ICD-10-CM | POA: Diagnosis not present

## 2017-06-21 ENCOUNTER — Other Ambulatory Visit: Payer: Self-pay

## 2017-06-21 ENCOUNTER — Encounter (HOSPITAL_COMMUNITY): Payer: Self-pay | Admitting: Internal Medicine

## 2017-06-21 ENCOUNTER — Ambulatory Visit (HOSPITAL_COMMUNITY)
Admission: RE | Admit: 2017-06-21 | Discharge: 2017-06-21 | Disposition: A | Discharge status: Home / Self Care | Payer: Medicare Other | Source: Ambulatory Visit | Attending: Internal Medicine | Admitting: Internal Medicine

## 2017-06-21 VITALS — BP 147/90 | HR 71 | Wt 179.0 lb

## 2017-06-21 DIAGNOSIS — I429 Cardiomyopathy, unspecified: Secondary | ICD-10-CM | POA: Insufficient documentation

## 2017-06-21 DIAGNOSIS — I5082 Biventricular heart failure: Secondary | ICD-10-CM | POA: Insufficient documentation

## 2017-06-21 DIAGNOSIS — K219 Gastro-esophageal reflux disease without esophagitis: Secondary | ICD-10-CM | POA: Insufficient documentation

## 2017-06-21 DIAGNOSIS — Z21 Asymptomatic human immunodeficiency virus [HIV] infection status: Secondary | ICD-10-CM | POA: Insufficient documentation

## 2017-06-21 DIAGNOSIS — I472 Ventricular tachycardia, unspecified: Secondary | ICD-10-CM

## 2017-06-21 DIAGNOSIS — I5022 Chronic systolic (congestive) heart failure: Secondary | ICD-10-CM | POA: Insufficient documentation

## 2017-06-21 DIAGNOSIS — Z79899 Other long term (current) drug therapy: Secondary | ICD-10-CM | POA: Insufficient documentation

## 2017-06-21 DIAGNOSIS — I428 Other cardiomyopathies: Secondary | ICD-10-CM

## 2017-06-21 DIAGNOSIS — I110 Hypertensive heart disease with heart failure: Secondary | ICD-10-CM | POA: Insufficient documentation

## 2017-06-21 DIAGNOSIS — I951 Orthostatic hypotension: Secondary | ICD-10-CM | POA: Diagnosis not present

## 2017-06-21 DIAGNOSIS — B1920 Unspecified viral hepatitis C without hepatic coma: Secondary | ICD-10-CM | POA: Diagnosis not present

## 2017-06-21 LAB — BASIC METABOLIC PANEL
ANION GAP: 11 (ref 5–15)
BUN: 10 mg/dL (ref 6–20)
CALCIUM: 9.3 mg/dL (ref 8.9–10.3)
CHLORIDE: 106 mmol/L (ref 101–111)
CO2: 22 mmol/L (ref 22–32)
Creatinine, Ser: 1.07 mg/dL (ref 0.61–1.24)
GFR calc non Af Amer: >60 mL/min (ref >60)
Glucose, Bld: 93 mg/dL (ref 65–99)
Potassium: 4.2 mmol/L (ref 3.5–5.1)
SODIUM: 139 mmol/L (ref 135–145)

## 2017-06-21 MED ORDER — SACUBITRIL-VALSARTAN 49-51 MG PO TABS: 1.0000 | ORAL_TABLET | Freq: Two times a day (BID) | ORAL | 3 refills | Status: DC

## 2017-06-21 NOTE — Patient Instructions (Signed)
INCREASE Entresto to 49/51mg  twice daily.  Routine lab work today. Will notify you of abnormal results  Repeat (bmet) in 2 weeks.  Follow up in 4 months with Dr.Bensimhon

## 2017-06-21 NOTE — Progress Notes (Signed)
Patient ID: [name], male   DOB: Nov 10, 1965, 52 y.o.   MRN: [id number]   [facility]  Patient ID: [name], male   DOB: [birth year], 52 y.o.   MRN: [id number]  ---Medtronic Optivol ----   Primary Physician: [name], MD  Primary Cardiologist: Dr Ladona Ridgel  Primary HF: Dr. [name]    HPI: [name] is a 52 y.o. male with a history of HIV 1998, HCV, ETOH abuse and CHF due to NICM s/p Medtronic  CRT-D (06/2013). He also has a h/o syncope and previously had an EP study which was non-inducible for VT.    Was admitted 01/19/13- 01/22/13 for acute on chronic systolic HF. ECHO EF 20-25%. He was diuresed with IV lasix. He was discharged on coreg 6.25 mg BID, digoxin 0.25 mg daily, lisinopril 40 mg daily, and spironolactone 25 mg daily. Discharge weight 150 lbs. He stopped spironolactone due to severe headaches.  He was not able to tolerate titration of Coreg to 25 mg bid due to lightheadedness.   Admitted 8/27-8/31/18 for syncope resulting in trimalleolar fracture of the right ankle and left proximal tibia. He underwent ORIF of right trimalleolar fracture. He was discharged to SNF.   He presents for regular follow up today. Feels great. BP back up to baseline. Denies SOB, orthopnea, lightheadedness or dizziness. Continues to drink at least 2 beers a day.  Taking all medication as directed. No further falls.   ICD: Interrogated personally in clinic No VT/AF. Volume ok   ECHO 04/2013: EF 20-25%, mod/severe MR, LA mod dilated ECHO 10/16/13 EF 20-25%  ECHO 03/14/15 EF 55% ECHO 12/17 EF 55% ECHO 8/18 EF 65-70%.   SH: Lives with partner in [place], drinking a wine cooler twice a week (prior heavy ETOH).   Review of systems complete and found to be negative unless listed in HPI.    Family History  Problem Relation Age of Onset  . Lung cancer Mother   . Colon cancer Unknown 50   Past Medical History:  Diagnosis Date  . CHF (congestive heart failure)  (HCC)   . Chronic systolic heart failure (HCC)    a. Echo 12/05/11:  EF 20-25%, diff HK with mild sparing of IL wall, mild AI, mod MR, mild LAE, mild RVE, mild reduced RVF.;  b.  Echo 05/11/2012:  Mild LVH, EF 20-25%, Gr 1 diast dysfn, mod MR, mild LAE  . Depression   . G6PD deficiency (HCC)   . GERD (gastroesophageal reflux disease)   . Hepatitis    Hep B and Hep C (patient does not report this but these are listed in previous notes.)  . HIV infection (HCC)   . Hypertension   . NICM (nonischemic cardiomyopathy) (HCC)    cardia CTA 8/13 negative for obstructive CAD  . VT (ventricular tachycardia) (HCC)     Current Outpatient Medications  Medication Sig Dispense Refill  . amiodarone (PACERONE) 200 MG tablet Take 1 tablet (200 mg total) by mouth daily. 30 tablet 11  . carvedilol (COREG) 25 MG tablet Take 1 tablet (25 mg total) 2 (two) times daily with a meal by mouth. 180 tablet 3  . clobetasol ointment (TEMOVATE) 0.05 % Apply 1 application topically 2 (two) times daily.  0  . colchicine 0.6 MG tablet Take 1 tablet (0.6 mg total) by mouth 2 (two) times daily as needed. For gout flares. 30 tablet 2  . Elbasvir-Grazoprevir (ZEPATIER) 50-100 MG TABS Take 1 tablet by mouth daily. 28 tablet  2  . emtricitabine-rilpivir-tenofovir AF (ODEFSEY) 200-25-25 MG TABS tablet Take 1 tablet by mouth daily with breakfast. 30 tablet 5  . famotidine (PEPCID) 20 MG tablet Take 1 tablet (20 mg total) 2 (two) times daily by mouth. 60 tablet 2  . gabapentin (NEURONTIN) 300 MG capsule Take 300 mg by mouth every evening.    . hydrOXYzine (ATARAX/VISTARIL) 50 MG tablet take 1 tablet by mouth three times a day if needed 15 tablet 1  . mirtazapine (REMERON) 7.5 MG tablet Take 1 tablet (7.5 mg total) by mouth at bedtime. 30 tablet 5  . ondansetron (ZOFRAN) 4 MG tablet Take 4 mg by mouth as needed.  0  . oxyCODONE-acetaminophen (ROXICET) 5-325 MG tablet Take 1-2 tablets by mouth every 4 (four) hours as needed for severe  pain. 40 tablet 0  . sacubitril-valsartan (ENTRESTO) 24-26 MG Take 1 tablet by mouth 2 (two) times daily.    Marland Kitchen spironolactone (ALDACTONE) 25 MG tablet Take 1 tablet (25 mg total) by mouth at bedtime. 30 tablet 6  . TIVICAY 50 MG tablet TAKE 1 TABLET BY MOUTH ONCE DAILY WITH LUNCH 30 tablet 5  . triamcinolone cream (KENALOG) 0.1 % Apply 1 application topically 3 (three) times daily.  0  . valACYclovir (VALTREX) 1000 MG tablet Take 1 tablet (1,000 mg total) by mouth 2 (two) times daily as needed (outbreaks). Take for 5 days for an outbreak 10 tablet 6   No current facility-administered medications for this encounter.     Vitals:   06/21/17 1215  BP: (!) 147/90  Pulse: 71  SpO2: 99%  Weight: 179 lb (81.2 kg)   Physical Exam:  General: Well appearing. No resp difficulty. HEENT: Normal Neck: Supple. JVP 5-6. Carotids 2+ bilat; no bruits. No thyromegaly or nodule noted. Cor: PMI nondisplaced. RRR, No M/G/R noted Lungs: CTAB, normal effort. Abdomen: Soft, non-tender, non-distended, no HSM. No bruits or masses. +BS  Extremities: No cyanosis, clubbing, or rash. R and LLE no edema.  Neuro: Alert & orientedx3, cranial nerves grossly intact. moves all 4 extremities w/o difficulty. Affect pleasant   ASSESSMENT & PLAN:  1) Chronic systolic HF: Nonischemic cardiomyopathy, ?due to ETOH abuse. EF 55% (03/2015) s/p Medtronic CRT-D (06/2013). 12/2016 ECHO EF 65-70%.  - NYHA II - ICD: Interrogated personally in clinic No VT/AF. Volume ok  - Continue Coreg 3.125 mg BID.  - Continue 12.5 mg spiro daily.   - Increase Entresto 49-51 mg BID. BMET today and repeat 2 weeks. - Reinforced fluid restriction to < 2 L daily, sodium restriction to less than 2000 mg daily, and the importance of daily weights.   2) Orthostatic hypotension/Syncope - No VT by device interrogation.    3) Ventricular tachycardia:  - Continue Amio. No VT by device interrogation today.  - Discussed the need to yearly eye exams.    4) HIV-  Followed by Dr Luciana Axe. Stable.   Meds as above. Labs today and repeat 2 weeks.   Graciella Freer, PA-C  06/21/2017 12:18 PM   Patient seen and examined with the above-signed Advanced Practice Provider and/or Housestaff. I personally reviewed laboratory data, imaging studies and relevant notes. I independently examined the patient and formulated the important aspects of the plan. I have edited the note to reflect any of my changes or salient points. I have personally discussed the plan with the patient and/or family.  Much improved. BP climbing back up. No edema, orthopnea or PND.  ICD Interrogated personally in clinic No VT/AF. Volume  ok  Will increase Entresto back to 49/51 bid. Reminded him on need to limit ETOH.   [name], MD  6:41 PM

## 2017-06-23 DIAGNOSIS — I110 Hypertensive heart disease with heart failure: Secondary | ICD-10-CM | POA: Diagnosis not present

## 2017-06-23 DIAGNOSIS — S82851D Displaced trimalleolar fracture of right lower leg, subsequent encounter for closed fracture with routine healing: Secondary | ICD-10-CM | POA: Diagnosis not present

## 2017-06-23 DIAGNOSIS — S62202D Unspecified fracture of first metacarpal bone, left hand, subsequent encounter for fracture with routine healing: Secondary | ICD-10-CM | POA: Diagnosis not present

## 2017-06-23 DIAGNOSIS — I5022 Chronic systolic (congestive) heart failure: Secondary | ICD-10-CM | POA: Diagnosis not present

## 2017-06-23 DIAGNOSIS — I429 Cardiomyopathy, unspecified: Secondary | ICD-10-CM | POA: Diagnosis not present

## 2017-06-23 DIAGNOSIS — S82102D Unspecified fracture of upper end of left tibia, subsequent encounter for closed fracture with routine healing: Secondary | ICD-10-CM | POA: Diagnosis not present

## 2017-07-02 ENCOUNTER — Other Ambulatory Visit: Payer: Self-pay

## 2017-07-03 MED ORDER — FAMOTIDINE 20 MG PO TABS: 20.0000 mg | ORAL_TABLET | Freq: Two times a day (BID) | ORAL | 2 refills | Status: DC

## 2017-07-05 ENCOUNTER — Other Ambulatory Visit: Payer: Self-pay | Admitting: *Deleted

## 2017-07-05 ENCOUNTER — Other Ambulatory Visit: Payer: Self-pay | Admitting: Internal Medicine

## 2017-07-05 ENCOUNTER — Ambulatory Visit (INDEPENDENT_AMBULATORY_CARE_PROVIDER_SITE_OTHER): Payer: Medicare HMO | Admitting: Pharmacist

## 2017-07-05 ENCOUNTER — Ambulatory Visit (HOSPITAL_COMMUNITY)
Admission: RE | Admit: 2017-07-05 | Discharge: 2017-07-05 | Disposition: A | Discharge status: Home / Self Care | Payer: Medicare HMO | Source: Ambulatory Visit | Attending: Cardiology | Admitting: Cardiology

## 2017-07-05 DIAGNOSIS — B182 Chronic viral hepatitis C: Secondary | ICD-10-CM | POA: Diagnosis not present

## 2017-07-05 DIAGNOSIS — I5022 Chronic systolic (congestive) heart failure: Secondary | ICD-10-CM | POA: Diagnosis not present

## 2017-07-05 DIAGNOSIS — B20 Human immunodeficiency virus [HIV] disease: Secondary | ICD-10-CM

## 2017-07-05 DIAGNOSIS — G4700 Insomnia, unspecified: Secondary | ICD-10-CM

## 2017-07-05 DIAGNOSIS — R69 Illness, unspecified: Secondary | ICD-10-CM | POA: Diagnosis not present

## 2017-07-05 LAB — BASIC METABOLIC PANEL
Anion gap: 12 (ref 5–15)
BUN: 9 mg/dL (ref 6–20)
CHLORIDE: 105 mmol/L (ref 101–111)
CO2: 23 mmol/L (ref 22–32)
Calcium: 9.3 mg/dL (ref 8.9–10.3)
Creatinine, Ser: 1.15 mg/dL (ref 0.61–1.24)
Glucose, Bld: 99 mg/dL (ref 65–99)
POTASSIUM: 4.3 mmol/L (ref 3.5–5.1)
Sodium: 140 mmol/L (ref 135–145)

## 2017-07-05 MED ORDER — MIRTAZAPINE 7.5 MG PO TABS
7.5000 mg | ORAL_TABLET | Freq: Every day | ORAL | 5 refills | Status: DC
Start: 2017-07-05 — End: 2018-04-25

## 2017-07-05 NOTE — Progress Notes (Addendum)
[facility] Pharmacy Visit  HPI: [name] is a 52 y.o. male who presents to the [facility] pharmacy clinic for Hep C and HIV follow-up.  He has Hep C genotype 1a, F3/F4 with normal platelets and CP class A, on Odefsey + Tivicay also for HIV, and started 12 weeks of Zepatier on 1/7.  Patient Active Problem List   Diagnosis Date Noted  . Nonischemic cardiomyopathy (HCC)   . Syncope 01/25/2017  . HIV (human immunodeficiency virus infection) (HCC) 01/25/2017  . Encounter for long-term (current) use of medications 03/21/2015  . Ventricular tachycardia, polymorphic (HCC) 02/03/2014  . PTSD (post-traumatic stress disorder) 02/03/2014  . Sustained VT (ventricular tachycardia) (HCC) 01/27/2014  . ICD (implantable cardioverter-defibrillator), biventricular, in situ 01/23/2014  . Paroxysmal ventricular fibrillation (HCC) 01/19/2014  . Protein-calorie malnutrition, severe (HCC) 02/13/2013  . GERD (gastroesophageal reflux disease) 08/19/2012  . Chronic systolic heart failure (HCC) 01/25/2012  . Wide-complex tachycardia (HCC) 01/07/2012  . Alcohol abuse 11/30/2011  . Marijuana abuse 11/30/2011  . Genital herpes 06/09/2006  . Chronic hepatitis C without hepatic coma (HCC) 06/09/2006  . Primary hypertension 06/09/2006    Patient's Medications  New Prescriptions   No medications on file  Previous Medications   AMIODARONE (PACERONE) 200 MG TABLET    Take 1 tablet (200 mg total) by mouth daily.   CARVEDILOL (COREG) 25 MG TABLET    Take 1 tablet (25 mg total) 2 (two) times daily with a meal by mouth.   CLOBETASOL OINTMENT (TEMOVATE) 0.05 %    Apply 1 application topically 2 (two) times daily.   COLCHICINE 0.6 MG TABLET    Take 1 tablet (0.6 mg total) by mouth 2 (two) times daily as needed. For gout flares.   ELBASVIR-GRAZOPREVIR (ZEPATIER) 50-100 MG TABS    Take 1 tablet by mouth daily.   EMTRICITABINE-RILPIVIR-TENOFOVIR AF (ODEFSEY) 200-25-25 MG TABS TABLET    Take 1  tablet by mouth daily with breakfast.   FAMOTIDINE (PEPCID) 20 MG TABLET    Take 1 tablet (20 mg total) by mouth 2 (two) times daily.   GABAPENTIN (NEURONTIN) 300 MG CAPSULE    Take 300 mg by mouth every evening.   HYDROXYZINE (ATARAX/VISTARIL) 50 MG TABLET    take 1 tablet by mouth three times a day if needed   MIRTAZAPINE (REMERON) 7.5 MG TABLET    Take 1 tablet (7.5 mg total) by mouth at bedtime.   ONDANSETRON (ZOFRAN) 4 MG TABLET    Take 4 mg by mouth as needed.   OXYCODONE-ACETAMINOPHEN (ROXICET) 5-325 MG TABLET    Take 1-2 tablets by mouth every 4 (four) hours as needed for severe pain.   SACUBITRIL-VALSARTAN (ENTRESTO) 49-51 MG    Take 1 tablet by mouth 2 (two) times daily.   SPIRONOLACTONE (ALDACTONE) 25 MG TABLET    Take 1 tablet (25 mg total) by mouth at bedtime.   TIVICAY 50 MG TABLET    TAKE 1 TABLET BY MOUTH ONCE DAILY WITH LUNCH   TRIAMCINOLONE CREAM (KENALOG) 0.1 %    Apply 1 application topically 3 (three) times daily.   VALACYCLOVIR (VALTREX) 1000 MG TABLET    Take 1 tablet (1,000 mg total) by mouth 2 (two) times daily as needed (outbreaks). Take for 5 days for an outbreak  Modified Medications   No medications on file  Discontinued Medications   No medications on file    Allergies: Allergies  Allergen Reactions  . Bactrim Rash    Past Medical History:  Past Medical History:  Diagnosis Date  . CHF (congestive heart failure) (HCC)   . Chronic systolic heart failure (HCC)    a. Echo 12/05/11:  EF 20-25%, diff HK with mild sparing of IL wall, mild AI, mod MR, mild LAE, mild RVE, mild reduced RVF.;  b.  Echo 05/11/2012:  Mild LVH, EF 20-25%, Gr 1 diast dysfn, mod MR, mild LAE  . Depression   . G6PD deficiency (HCC)   . GERD (gastroesophageal reflux disease)   . Hepatitis    Hep B and Hep C (patient does not report this but these are listed in previous notes.)  . HIV infection (HCC)   . Hypertension   . NICM (nonischemic cardiomyopathy) (HCC)    cardia CTA 8/13 negative  for obstructive CAD  . VT (ventricular tachycardia) (HCC)     Social History: Social History   Socioeconomic History  . Marital status: Single    Spouse name: Not on file  . Number of children: Not on file  . Years of education: Not on file  . Highest education level: Not on file  Social Needs  . Financial resource strain: Not on file  . Food insecurity - worry: Not on file  . Food insecurity - inability: Not on file  . Transportation needs - medical: Not on file  . Transportation needs - non-medical: Not on file  Occupational History  . Occupation: Unemployed  Tobacco Use  . Smoking status: Never Smoker  . Smokeless tobacco: Never Used  Substance and Sexual Activity  . Alcohol use: Yes    Alcohol/week: 0.6 oz    Types: 1 Cans of beer per week    Comment: occ beer  . Drug use: Yes    Frequency: 7.0 times per week    Types: Marijuana    Comment: marijuana last used jan 2018  . Sexual activity: Yes    Partners: Male    Comment: 35 year relationship, givencondoms  Other Topics Concern  . Not on file  Social History Narrative   Lives alone.  Drinks beer daily.  Liquor rarely.  He occasionally smokes marijuana..    Labs: HIV 1 RNA Quant (copies/mL)  Date Value  04/07/2017 <20 NOT DETECTED  12/19/2016 <20  07/02/2016 <20 NOT DETECTED   CD4 T Cell Abs (/uL)  Date Value  04/07/2017 550  12/19/2016 510  07/02/2016 540   Hep B S Ab (no units)  Date Value  07/26/2006 Yes   Hepatitis B Surface Ag (no units)  Date Value  07/26/2006 No   HCV Ab (no units)  Date Value  07/26/2006 Yes    Lab Results  Component Value Date   HCVGENOTYPE 1a 04/28/2017    Hepatitis C RNA quantitative Latest Ref Rng & Units 04/28/2017 07/02/2016 11/11/2010 07/26/2006  HCV Quantitative NOT DETECTED IU/mL - 17,200,000(H) 24700000(H) 250000 (?)  HCV Quantitative Log NOT DETECT Log IU/mL 7.49(H) 7.24(H) 7.39(H) -    AST (U/L)  Date Value  05/11/2017 93 (H)  04/07/2017 44 (H)   01/26/2017 84 (H)   ALT (U/L)  Date Value  05/11/2017 70 (H)  04/28/2017 61 (H)  04/07/2017 37  01/26/2017 56   INR  Date Value  04/28/2017 1.0  02/12/2013 0.96  02/11/2012 1.0 ratio    Fibrosis Score: F3/F4 as assessed by [facility]   Child-Pugh Score: A  Assessment: Stclair is here today to follow-up for his HIV/Hep C co-infection.  He is currently taking Odefsey and Tivicay for his HIV with no  issues and no missed doses.  He started 12 weeks of Zepatier on 1/7.  He has missed no doses except for yesterday when he completely forgot.  He does not forget to take his Odefsey or Tivicay.  He takes them around noon.  I encouraged him not to miss any more doses.  He is not having any side effects or issues with either medication. I will check both a HIV and Hep C viral load today.  He will come back when he is done in March and he will see Dr. Luciana Axe in May.   Plan: - Continue Zepatier x 12 weeks - Continue Odefsey and Tivicay with food - HIV and Hep C viral load today - F/u with me again 3/26 at 1130am  Shikita Vaillancourt L. Gladies Sofranko, PharmD, AAHIVP, CPP Infectious Diseases Clinical Pharmacist [facility] 07/05/2017, 11:18 AM

## 2017-07-06 LAB — HEPATITIS C RNA QUANTITATIVE
HCV QUANT LOG: DETECTED {Log_IU}/mL — AB
HCV RNA, PCR, QN: <15 IU/mL — AB

## 2017-07-07 LAB — HIV-1 RNA QUANT-NO REFLEX-BLD
HIV 1 RNA Quant: <20 copies/mL
HIV-1 RNA QUANT, LOG: NOT DETECTED {Log_copies}/mL

## 2017-07-13 MED FILL — ZEPATIER 50-100 MG TABLET: 50-100 | 28 days supply | Qty: 28 | Fill #2

## 2017-07-14 ENCOUNTER — Ambulatory Visit: Payer: Medicare Other | Admitting: *Deleted

## 2017-07-14 ENCOUNTER — Telehealth: Payer: Self-pay | Admitting: Cardiology

## 2017-07-14 NOTE — Telephone Encounter (Signed)
Spoke with pt and reminded pt of remote transmission that is due today. Pt verbalized understanding.   

## 2017-07-15 ENCOUNTER — Encounter: Payer: Self-pay | Admitting: Cardiology

## 2017-07-16 ENCOUNTER — Ambulatory Visit (INDEPENDENT_AMBULATORY_CARE_PROVIDER_SITE_OTHER): Payer: Medicare HMO | Admitting: *Deleted

## 2017-07-16 DIAGNOSIS — I428 Other cardiomyopathies: Secondary | ICD-10-CM | POA: Diagnosis not present

## 2017-07-19 NOTE — Progress Notes (Signed)
Remote ICD transmission.   

## 2017-07-20 ENCOUNTER — Other Ambulatory Visit: Payer: Self-pay | Admitting: *Deleted

## 2017-07-20 DIAGNOSIS — M6281 Muscle weakness (generalized): Secondary | ICD-10-CM | POA: Diagnosis not present

## 2017-07-20 DIAGNOSIS — R2689 Other abnormalities of gait and mobility: Secondary | ICD-10-CM | POA: Diagnosis not present

## 2017-07-20 DIAGNOSIS — S82851S Displaced trimalleolar fracture of right lower leg, sequela: Secondary | ICD-10-CM | POA: Diagnosis not present

## 2017-07-20 MED ORDER — COLCHICINE 0.6 MG PO TABS: 0.6000 mg | ORAL_TABLET | Freq: Two times a day (BID) | ORAL | 2 refills | Status: DC | PRN

## 2017-07-21 ENCOUNTER — Encounter: Payer: Self-pay | Admitting: Cardiology

## 2017-08-01 LAB — CUP PACEART REMOTE DEVICE CHECK
Brady Statistic AP VP Percent: 0.09 %
Brady Statistic AP VS Percent: 0.01 %
Brady Statistic AS VP Percent: 98.39 %
Brady Statistic AS VS Percent: 1.5 %
Brady Statistic RV Percent Paced: 1.69 %
Date Time Interrogation Session: 20190215182041
HighPow Impedance: 78 Ohm
Implantable Lead Implant Date: 20150126
Implantable Lead Location: 753859
Implantable Lead Model: 5076
Lead Channel Impedance Value: 456 Ohm
Lead Channel Impedance Value: 513 Ohm
Lead Channel Pacing Threshold Amplitude: 0.75 V
Lead Channel Pacing Threshold Pulse Width: 0.4 ms
Lead Channel Sensing Intrinsic Amplitude: 1.375 mV
Lead Channel Sensing Intrinsic Amplitude: 13.625 mV
Lead Channel Setting Pacing Amplitude: 2 V
Lead Channel Setting Pacing Pulse Width: 0.4 ms
Lead Channel Setting Pacing Pulse Width: 0.4 ms
MDC IDC LEAD IMPLANT DT: 20150126
MDC IDC LEAD IMPLANT DT: 20150126
MDC IDC LEAD LOCATION: 753858
MDC IDC LEAD LOCATION: 753860
MDC IDC MSMT BATTERY REMAINING LONGEVITY: 32 mo
MDC IDC MSMT BATTERY VOLTAGE: 2.95 V
MDC IDC MSMT LEADCHNL LV IMPEDANCE VALUE: 532 Ohm
MDC IDC MSMT LEADCHNL LV IMPEDANCE VALUE: 893 Ohm
MDC IDC MSMT LEADCHNL LV PACING THRESHOLD PULSEWIDTH: 0.4 ms
MDC IDC MSMT LEADCHNL RA IMPEDANCE VALUE: 399 Ohm
MDC IDC MSMT LEADCHNL RA PACING THRESHOLD AMPLITUDE: 0.625 V
MDC IDC MSMT LEADCHNL RA SENSING INTR AMPL: 1.375 mV
MDC IDC MSMT LEADCHNL RV IMPEDANCE VALUE: 361 Ohm
MDC IDC MSMT LEADCHNL RV PACING THRESHOLD AMPLITUDE: 0.625 V
MDC IDC MSMT LEADCHNL RV PACING THRESHOLD PULSEWIDTH: 0.4 ms
MDC IDC MSMT LEADCHNL RV SENSING INTR AMPL: 13.625 mV
MDC IDC PG IMPLANT DT: 20150126
MDC IDC SET LEADCHNL LV PACING AMPLITUDE: 1.75 V
MDC IDC SET LEADCHNL RV PACING AMPLITUDE: 2.5 V
MDC IDC SET LEADCHNL RV SENSING SENSITIVITY: 0.3 mV
MDC IDC STAT BRADY RA PERCENT PACED: 0.11 %

## 2017-08-17 DIAGNOSIS — S82851S Displaced trimalleolar fracture of right lower leg, sequela: Secondary | ICD-10-CM | POA: Diagnosis not present

## 2017-08-17 DIAGNOSIS — M6281 Muscle weakness (generalized): Secondary | ICD-10-CM | POA: Diagnosis not present

## 2017-08-17 DIAGNOSIS — R2689 Other abnormalities of gait and mobility: Secondary | ICD-10-CM | POA: Diagnosis not present

## 2017-08-24 ENCOUNTER — Ambulatory Visit (INDEPENDENT_AMBULATORY_CARE_PROVIDER_SITE_OTHER): Payer: Medicare HMO | Admitting: Pharmacist

## 2017-08-24 DIAGNOSIS — B182 Chronic viral hepatitis C: Secondary | ICD-10-CM

## 2017-08-24 DIAGNOSIS — R69 Illness, unspecified: Secondary | ICD-10-CM | POA: Diagnosis not present

## 2017-08-24 DIAGNOSIS — B20 Human immunodeficiency virus [HIV] disease: Secondary | ICD-10-CM

## 2017-08-24 NOTE — Progress Notes (Signed)
[facility] Pharmacy Visit  HPI: [name] is a 52 y.o. male who presents to the [facility] pharmacy clinic for HIV and Hep C follow-up.  He has genotype 1a, F3/F4 with CP class A, also on Tivicay + Odefsey for HIV, and will completed 12 weeks of Zepatier on Sunday 3/31.  Patient Active Problem List   Diagnosis Date Noted  . Nonischemic cardiomyopathy (HCC)   . Syncope 01/25/2017  . HIV (human immunodeficiency virus infection) (HCC) 01/25/2017  . Encounter for long-term (current) use of medications 03/21/2015  . Ventricular tachycardia, polymorphic (HCC) 02/03/2014  . PTSD (post-traumatic stress disorder) 02/03/2014  . Sustained VT (ventricular tachycardia) (HCC) 01/27/2014  . ICD (implantable cardioverter-defibrillator), biventricular, in situ 01/23/2014  . Paroxysmal ventricular fibrillation (HCC) 01/19/2014  . Protein-calorie malnutrition, severe (HCC) 02/13/2013  . GERD (gastroesophageal reflux disease) 08/19/2012  . Chronic systolic heart failure (HCC) 01/25/2012  . Wide-complex tachycardia (HCC) 01/07/2012  . Alcohol abuse 11/30/2011  . Marijuana abuse 11/30/2011  . Genital herpes 06/09/2006  . Chronic hepatitis C without hepatic coma (HCC) 06/09/2006  . Primary hypertension 06/09/2006    Patient's Medications  New Prescriptions   No medications on file  Previous Medications   AMIODARONE (PACERONE) 200 MG TABLET    Take 1 tablet (200 mg total) by mouth daily.   CARVEDILOL (COREG) 25 MG TABLET    Take 1 tablet (25 mg total) 2 (two) times daily with a meal by mouth.   CLOBETASOL OINTMENT (TEMOVATE) 0.05 %    Apply 1 application topically 2 (two) times daily.   COLCHICINE 0.6 MG TABLET    Take 1 tablet (0.6 mg total) by mouth 2 (two) times daily as needed. For gout flares.   ELBASVIR-GRAZOPREVIR (ZEPATIER) 50-100 MG TABS    Take 1 tablet by mouth daily.   EMTRICITABINE-RILPIVIR-TENOFOVIR AF (ODEFSEY) 200-25-25 MG TABS TABLET    Take 1 tablet by mouth  daily with breakfast.   FAMOTIDINE (PEPCID) 20 MG TABLET    Take 1 tablet (20 mg total) by mouth 2 (two) times daily.   GABAPENTIN (NEURONTIN) 300 MG CAPSULE    Take 300 mg by mouth every evening.   HYDROXYZINE (ATARAX/VISTARIL) 50 MG TABLET    take 1 tablet by mouth three times a day if needed   MIRTAZAPINE (REMERON) 7.5 MG TABLET    Take 1 tablet (7.5 mg total) by mouth at bedtime.   ONDANSETRON (ZOFRAN) 4 MG TABLET    Take 4 mg by mouth as needed.   OXYCODONE-ACETAMINOPHEN (ROXICET) 5-325 MG TABLET    Take 1-2 tablets by mouth every 4 (four) hours as needed for severe pain.   SACUBITRIL-VALSARTAN (ENTRESTO) 49-51 MG    Take 1 tablet by mouth 2 (two) times daily.   SPIRONOLACTONE (ALDACTONE) 25 MG TABLET    Take 1 tablet (25 mg total) by mouth at bedtime.   TIVICAY 50 MG TABLET    TAKE 1 TABLET BY MOUTH ONCE DAILY WITH LUNCH   TRIAMCINOLONE CREAM (KENALOG) 0.1 %    Apply 1 application topically 3 (three) times daily.   VALACYCLOVIR (VALTREX) 1000 MG TABLET    Take 1 tablet (1,000 mg total) by mouth 2 (two) times daily as needed (outbreaks). Take for 5 days for an outbreak  Modified Medications   No medications on file  Discontinued Medications   No medications on file    Allergies: Allergies  Allergen Reactions  . Bactrim Rash    Past Medical History: Past Medical History:  Diagnosis Date  . CHF (congestive heart failure) (HCC)   . Chronic systolic heart failure (HCC)    a. Echo 12/05/11:  EF 20-25%, diff HK with mild sparing of IL wall, mild AI, mod MR, mild LAE, mild RVE, mild reduced RVF.;  b.  Echo 05/11/2012:  Mild LVH, EF 20-25%, Gr 1 diast dysfn, mod MR, mild LAE  . Depression   . G6PD deficiency (HCC)   . GERD (gastroesophageal reflux disease)   . Hepatitis    Hep B and Hep C (patient does not report this but these are listed in previous notes.)  . HIV infection (HCC)   . Hypertension   . NICM (nonischemic cardiomyopathy) (HCC)    cardia CTA 8/13 negative for obstructive  CAD  . VT (ventricular tachycardia) (HCC)     Social History: Social History   Socioeconomic History  . Marital status: Single    Spouse name: Not on file  . Number of children: Not on file  . Years of education: Not on file  . Highest education level: Not on file  Occupational History  . Occupation: Unemployed  Social Needs  . Financial resource strain: Not on file  . Food insecurity:    Worry: Not on file    Inability: Not on file  . Transportation needs:    Medical: Not on file    Non-medical: Not on file  Tobacco Use  . Smoking status: Never Smoker  . Smokeless tobacco: Never Used  Substance and Sexual Activity  . Alcohol use: Yes    Alcohol/week: 0.6 oz    Types: 1 Cans of beer per week    Comment: occ beer  . Drug use: Yes    Frequency: 7.0 times per week    Types: Marijuana    Comment: marijuana last used jan 2018  . Sexual activity: Yes    Partners: Male    Comment: 35 year relationship, givencondoms  Lifestyle  . Physical activity:    Days per week: Not on file    Minutes per session: Not on file  . Stress: Not on file  Relationships  . Social connections:    Talks on phone: Not on file    Gets together: Not on file    Attends religious service: Not on file    Active member of club or organization: Not on file    Attends meetings of clubs or organizations: Not on file    Relationship status: Not on file  Other Topics Concern  . Not on file  Social History Narrative   Lives alone.  Drinks beer daily.  Liquor rarely.  He occasionally smokes marijuana..    Labs: HIV 1 RNA Quant (copies/mL)  Date Value  07/05/2017 <20 NOT DETECTED  04/07/2017 <20 NOT DETECTED  12/19/2016 <20   CD4 T Cell Abs (/uL)  Date Value  04/07/2017 550  12/19/2016 510  07/02/2016 540   Hep B S Ab (no units)  Date Value  07/26/2006 Yes   Hepatitis B Surface Ag (no units)  Date Value  07/26/2006 No   HCV Ab (no units)  Date Value  07/26/2006 Yes    Lab  Results  Component Value Date   HCVGENOTYPE 1a 04/28/2017    Hepatitis C RNA quantitative Latest Ref Rng & Units 07/05/2017 04/28/2017 07/02/2016 11/11/2010 07/26/2006  HCV Quantitative NOT DETECTED IU/mL - - 17,200,000(H) 24700000(H) 250000 (?)  HCV Quantitative Log NOT DETECT Log IU/mL <1.18 DETECTED(A) 7.49(H) 7.24(H) 7.39(H) -    AST (  U/L)  Date Value  05/11/2017 93 (H)  04/07/2017 44 (H)  01/26/2017 84 (H)   ALT (U/L)  Date Value  05/11/2017 70 (H)  04/28/2017 61 (H)  04/07/2017 37  01/26/2017 56   INR  Date Value  04/28/2017 1.0  02/12/2013 0.96  02/11/2012 1.0 ratio    Fibrosis Score: F3/F4 as assessed by elastography   Child-Pugh Score: A  Assessment: Mehran is here today to follow-up for his Hep C and HIV co-infection.  He is doing well on Tivicay and Odefsey - no missed doses and no issues.  He has not missed any doses of his Zepatier and takes his last dose on Sunday 3/31.  He told me last visit that he missed one dose but today tells me it was just taken late - no complete missed days. His early on treatment Hep C viral load was undetectable and so was his HIV viral load when last checked as well.  He is asking [place] to renew his handicap sticker today, so I had Feliz Beam help him.  I will check another Hep C viral load today and he will follow-up with Dr. Luciana Axe at the end of May.   Plan: - Complete 12 weeks of Zepatier on Sunday - Continue Tivicay and ZOXWRUE - F/u wit Dr. Luciana Axe 10/26/17 at 145pm  Cassie L. Kuppelweiser, PharmD, AAHIVP, CPP Infectious Diseases Clinical Pharmacist [facility] 08/24/2017, 1:39 PM

## 2017-08-26 LAB — HEPATITIS C RNA QUANTITATIVE
HCV Quantitative Log: <1.18 Log IU/mL
HCV RNA, PCR, QN: <15 IU/mL

## 2017-09-03 ENCOUNTER — Other Ambulatory Visit: Payer: Self-pay | Admitting: *Deleted

## 2017-09-03 MED ORDER — VALACYCLOVIR HCL 1 G PO TABS: 1000.0000 mg | ORAL_TABLET | Freq: Two times a day (BID) | ORAL | 6 refills | Status: DC | PRN

## 2017-09-17 DIAGNOSIS — R2689 Other abnormalities of gait and mobility: Secondary | ICD-10-CM | POA: Diagnosis not present

## 2017-09-17 DIAGNOSIS — S82851S Displaced trimalleolar fracture of right lower leg, sequela: Secondary | ICD-10-CM | POA: Diagnosis not present

## 2017-09-17 DIAGNOSIS — M6281 Muscle weakness (generalized): Secondary | ICD-10-CM | POA: Diagnosis not present

## 2017-09-27 ENCOUNTER — Other Ambulatory Visit: Payer: Self-pay

## 2017-09-27 MED ORDER — FAMOTIDINE 20 MG PO TABS: 20.0000 mg | ORAL_TABLET | Freq: Two times a day (BID) | ORAL | 2 refills | Status: DC

## 2017-09-30 DIAGNOSIS — L280 Lichen simplex chronicus: Secondary | ICD-10-CM | POA: Insufficient documentation

## 2017-09-30 DIAGNOSIS — L281 Prurigo nodularis: Secondary | ICD-10-CM | POA: Diagnosis not present

## 2017-10-12 ENCOUNTER — Other Ambulatory Visit: Payer: Medicare HMO

## 2017-10-12 DIAGNOSIS — B182 Chronic viral hepatitis C: Secondary | ICD-10-CM | POA: Diagnosis not present

## 2017-10-12 DIAGNOSIS — R69 Illness, unspecified: Secondary | ICD-10-CM | POA: Diagnosis not present

## 2017-10-13 LAB — T-HELPER CELL (CD4) - (RCID CLINIC ONLY)
CD4 % Helper T Cell: 23 % — ABNORMAL LOW (ref 33–55)
CD4 T Cell Abs: 680 /uL (ref 400–2700)

## 2017-10-14 LAB — HIV-1 RNA QUANT-NO REFLEX-BLD
HIV 1 RNA Quant: <20 copies/mL — AB
HIV-1 RNA QUANT, LOG: DETECTED {Log_copies}/mL — AB

## 2017-10-17 ENCOUNTER — Other Ambulatory Visit: Payer: Self-pay | Admitting: Internal Medicine

## 2017-10-17 DIAGNOSIS — M6281 Muscle weakness (generalized): Secondary | ICD-10-CM | POA: Diagnosis not present

## 2017-10-17 DIAGNOSIS — R2689 Other abnormalities of gait and mobility: Secondary | ICD-10-CM | POA: Diagnosis not present

## 2017-10-17 DIAGNOSIS — S82851S Displaced trimalleolar fracture of right lower leg, sequela: Secondary | ICD-10-CM | POA: Diagnosis not present

## 2017-10-18 ENCOUNTER — Ambulatory Visit (INDEPENDENT_AMBULATORY_CARE_PROVIDER_SITE_OTHER): Payer: Medicare HMO | Admitting: *Deleted

## 2017-10-18 ENCOUNTER — Telehealth: Payer: Self-pay | Admitting: Cardiology

## 2017-10-18 DIAGNOSIS — I428 Other cardiomyopathies: Secondary | ICD-10-CM

## 2017-10-18 NOTE — Telephone Encounter (Signed)
Spoke with pt and reminded pt of remote transmission that is due today. Pt verbalized understanding.   

## 2017-10-20 ENCOUNTER — Encounter (HOSPITAL_COMMUNITY): Payer: Self-pay | Admitting: Internal Medicine

## 2017-10-20 ENCOUNTER — Encounter: Payer: Self-pay | Admitting: Internal Medicine

## 2017-10-20 ENCOUNTER — Ambulatory Visit (HOSPITAL_COMMUNITY)
Admission: RE | Admit: 2017-10-20 | Discharge: 2017-10-20 | Disposition: A | Discharge status: Home / Self Care | Payer: Medicare HMO | Source: Ambulatory Visit | Attending: Internal Medicine | Admitting: Internal Medicine

## 2017-10-20 ENCOUNTER — Other Ambulatory Visit: Payer: Self-pay

## 2017-10-20 VITALS — BP 131/93 | HR 64 | Wt 185.1 lb

## 2017-10-20 DIAGNOSIS — Z79899 Other long term (current) drug therapy: Secondary | ICD-10-CM | POA: Diagnosis not present

## 2017-10-20 DIAGNOSIS — I110 Hypertensive heart disease with heart failure: Secondary | ICD-10-CM | POA: Insufficient documentation

## 2017-10-20 DIAGNOSIS — K219 Gastro-esophageal reflux disease without esophagitis: Secondary | ICD-10-CM | POA: Diagnosis not present

## 2017-10-20 DIAGNOSIS — F329 Major depressive disorder, single episode, unspecified: Secondary | ICD-10-CM | POA: Insufficient documentation

## 2017-10-20 DIAGNOSIS — Z95810 Presence of automatic (implantable) cardiac defibrillator: Secondary | ICD-10-CM | POA: Diagnosis not present

## 2017-10-20 DIAGNOSIS — I428 Other cardiomyopathies: Secondary | ICD-10-CM | POA: Diagnosis not present

## 2017-10-20 DIAGNOSIS — I472 Ventricular tachycardia: Secondary | ICD-10-CM | POA: Diagnosis not present

## 2017-10-20 DIAGNOSIS — I4729 Other ventricular tachycardia: Secondary | ICD-10-CM

## 2017-10-20 DIAGNOSIS — Z21 Asymptomatic human immunodeficiency virus [HIV] infection status: Secondary | ICD-10-CM | POA: Insufficient documentation

## 2017-10-20 DIAGNOSIS — I5022 Chronic systolic (congestive) heart failure: Secondary | ICD-10-CM | POA: Insufficient documentation

## 2017-10-20 DIAGNOSIS — E7401 von Gierke disease: Secondary | ICD-10-CM | POA: Diagnosis not present

## 2017-10-20 DIAGNOSIS — Z801 Family history of malignant neoplasm of trachea, bronchus and lung: Secondary | ICD-10-CM | POA: Diagnosis not present

## 2017-10-20 DIAGNOSIS — R69 Illness, unspecified: Secondary | ICD-10-CM | POA: Diagnosis not present

## 2017-10-20 LAB — COMPREHENSIVE METABOLIC PANEL
ALK PHOS: 117 U/L (ref 38–126)
ALT: 33 U/L (ref 17–63)
AST: 49 U/L — AB (ref 15–41)
Albumin: 4.3 g/dL (ref 3.5–5.0)
Anion gap: 10 (ref 5–15)
BILIRUBIN TOTAL: 0.8 mg/dL (ref 0.3–1.2)
BUN: 16 mg/dL (ref 6–20)
CALCIUM: 9.6 mg/dL (ref 8.9–10.3)
CHLORIDE: 107 mmol/L (ref 101–111)
CO2: 24 mmol/L (ref 22–32)
CREATININE: 1.39 mg/dL — AB (ref 0.61–1.24)
GFR, EST NON AFRICAN AMERICAN: 56 mL/min — AB (ref >60)
Glucose, Bld: 88 mg/dL (ref 65–99)
Potassium: 4.6 mmol/L (ref 3.5–5.1)
Sodium: 141 mmol/L (ref 135–145)
TOTAL PROTEIN: 8.2 g/dL — AB (ref 6.5–8.1)

## 2017-10-20 LAB — TSH: TSH: 4.432 u[IU]/mL (ref 0.350–4.500)

## 2017-10-20 MED ORDER — SPIRONOLACTONE 25 MG PO TABS: 25.0000 mg | ORAL_TABLET | Freq: Every day | ORAL | 6 refills | Status: DC

## 2017-10-20 MED ORDER — FUROSEMIDE 40 MG PO TABS: 40.0000 mg | ORAL_TABLET | ORAL | 3 refills | Status: DC | PRN

## 2017-10-20 NOTE — Progress Notes (Signed)
Patient ID: [name], male   DOB: 02-25-1966, 52 y.o.   MRN: [id number]   [facility]  Patient ID: [name], male   DOB: 26-Aug-1965, 52 y.o.   MRN: [id number]  ---Medtronic Optivol ----   Primary Physician: [name], MD  Primary Cardiologist: Dr Ladona Ridgel  Primary HF: Dr. [name]    HPI: REASON BLAN is a 52 y.o. male with a history of HIV 1998, HCV, ETOH abuse and CHF due to NICM s/p Medtronic  CRT-D (06/2013). He also has a h/o syncope and previously had an EP study which was non-inducible for VT.    Was admitted 01/19/13- 01/22/13 for acute on chronic systolic HF. ECHO EF 20-25%. He was diuresed with IV lasix. He was discharged on coreg 6.25 mg BID, digoxin 0.25 mg daily, lisinopril 40 mg daily, and spironolactone 25 mg daily. Discharge weight 150 lbs. He stopped spironolactone due to severe headaches.  He was not able to tolerate titration of Coreg to 25 mg bid due to lightheadedness.   Admitted 8/27-8/31/18 for syncope resulting in trimalleolar fracture of the right ankle and left proximal tibia. He underwent ORIF of right trimalleolar fracture. HF meds and diuretics adjusted. He was discharged to SNF.   Today he presents for HF follow up. Overall feeling fine. Denies SOB/PND/Orthopnea.No further dizziness or falls. No orthostasis. Appetite ok. No fever or chills. Not weighing at home. Taking all medications.   ICD interrogated personally in clinic: No VT/AFib. Activity ~4 hours per day. Fluid well below threshold.   ECHO 04/2013: EF 20-25%, mod/severe MR, LA mod dilated ECHO 10/16/13 EF 20-25%  ECHO 03/14/15 EF 55% ECHO 12/17 EF 55% ECHO 8/18 EF 65-70%.   SH: Lives with partner in [place], drinking a wine cooler twice a week (prior heavy ETOH).   Review of systems complete and found to be negative unless listed in HPI.    Family History  Problem Relation Age of Onset  . Lung cancer Mother   . Colon cancer Unknown 50   Past Medical  History:  Diagnosis Date  . CHF (congestive heart failure) (HCC)   . Chronic systolic heart failure (HCC)    a. Echo 12/05/11:  EF 20-25%, diff HK with mild sparing of IL wall, mild AI, mod MR, mild LAE, mild RVE, mild reduced RVF.;  b.  Echo 05/11/2012:  Mild LVH, EF 20-25%, Gr 1 diast dysfn, mod MR, mild LAE  . Depression   . G6PD deficiency (HCC)   . GERD (gastroesophageal reflux disease)   . Hepatitis    Hep B and Hep C (patient does not report this but these are listed in previous notes.)  . HIV infection (HCC)   . Hypertension   . NICM (nonischemic cardiomyopathy) (HCC)    cardia CTA 8/13 negative for obstructive CAD  . VT (ventricular tachycardia) (HCC)     Current Outpatient Medications  Medication Sig Dispense Refill  . amiodarone (PACERONE) 200 MG tablet Take 1 tablet (200 mg total) by mouth daily. 30 tablet 11  . carvedilol (COREG) 25 MG tablet Take 1 tablet (25 mg total) 2 (two) times daily with a meal by mouth. 180 tablet 3  . clobetasol ointment (TEMOVATE) 0.05 % Apply 1 application topically 2 (two) times daily.  0  . colchicine 0.6 MG tablet Take 1 tablet (0.6 mg total) by mouth 2 (two) times daily as needed. For gout flares. 30 tablet 2  . Elbasvir-Grazoprevir (ZEPATIER) 50-100 MG TABS Take 1 tablet  by mouth daily. 28 tablet 2  . famotidine (PEPCID) 20 MG tablet Take 1 tablet (20 mg total) by mouth 2 (two) times daily. 60 tablet 2  . gabapentin (NEURONTIN) 300 MG capsule Take 300 mg by mouth every evening.    . hydrOXYzine (ATARAX/VISTARIL) 50 MG tablet take 1 tablet by mouth three times a day if needed 15 tablet 1  . mirtazapine (REMERON) 7.5 MG tablet Take 1 tablet (7.5 mg total) by mouth at bedtime. 30 tablet 5  . ODEFSEY 200-25-25 MG TABS tablet TAKE 1 TABLET BY MOUTH EVERY DAY WITH BREAKFAST 30 tablet 5  . ondansetron (ZOFRAN) 4 MG tablet Take 4 mg by mouth as needed.  0  . sacubitril-valsartan (ENTRESTO) 49-51 MG Take 1 tablet by mouth 2 (two) times daily. 60 tablet  3  . spironolactone (ALDACTONE) 25 MG tablet Take 1 tablet (25 mg total) by mouth at bedtime. (Patient taking differently: Take 12.5 mg by mouth at bedtime. ) 30 tablet 6  . TIVICAY 50 MG tablet TAKE 1 TABLET BY MOUTH ONCE DAILY WITH LUNCH 30 tablet 5  . triamcinolone cream (KENALOG) 0.1 % Apply 1 application topically 3 (three) times daily.  0  . valACYclovir (VALTREX) 1000 MG tablet Take 1 tablet (1,000 mg total) by mouth 2 (two) times daily as needed (outbreaks). Take for 5 days for an outbreak 10 tablet 6   No current facility-administered medications for this encounter.     Vitals:   10/20/17 1028  BP: (!) 131/93  Pulse: 64  SpO2: 99%  Weight: 185 lb 1.9 oz (84 kg)   Wt Readings from Last 3 Encounters:  10/20/17 185 lb 1.9 oz (84 kg)  06/21/17 179 lb (81.2 kg)  05/20/17 178 lb 12.8 oz (81.1 kg)    Physical Exam:  General:  Well appearing. No resp difficulty HEENT: normal Neck: supple. no JVD. Carotids 2+ bilat; no bruits. No lymphadenopathy or thryomegaly appreciated. Cor: PMI nondisplaced. Regular rate & rhythm. No rubs, gallops or murmurs. Lungs: clear Abdomen: soft, nontender, nondistended. No hepatosplenomegaly. No bruits or masses. Good bowel sounds. Extremities: no cyanosis, clubbing, rash, edema Neuro: alert & orientedx3, cranial nerves grossly intact. moves all 4 extremities w/o difficulty. Affect pleasant  ASSESSMENT & PLAN:  1) Chronic systolic HF: Nonischemic cardiomyopathy, ?due to ETOH abuse. EF 55% (03/2015) s/p Medtronic CRT-D (06/2013). 12/2016 ECHO EF 65-70%.  - NYHA II - ICD: Interrogated personally in clinic No VT/AF. Volume ok. Asked him to take a litte lasix as needed.  - Continue Coreg 3.125 mg BID.  - Increase spiro to 25 mg daily. .   - Continue Entresto 49-51 mg BID.  -BMEt today and in 7 days  2) Ventricular tachycardia:  - Continue Amio.  - No VT No VT by device interrogation today.  - Discussed the need to yearly eye exams.  -Check LFTs  today   3) HIV-  Followed by Dr Luciana Axe. Stable.   Follow up in 3-4 monthss.   Tonye Becket, NP  10/20/2017 11:00 AM   Patient seen and examined with Tonye Becket, NP. We discussed all aspects of the encounter. I agree with the assessment and plan as stated above.   Doing very well. EF recovered. Volume status stable. No further orthostasis or falls. Use lasix PRN only. BP slightly elevated - ok to increase spiro to 25 daily but watch closely for orthostasis. Would not increase Entresto at this point. ICD interrogated personally in clinic. Will decrease amio to 100 daily. With  EF recovery hopefully we can eventually wean off completely.   [name], MD  10:05 PM

## 2017-10-20 NOTE — Patient Instructions (Signed)
Increase Spironolactone to 25 mg (1 tab) daily  Take Furosemide 40 mg AS NEEDED  Labs today  Labs in 1 week:  Wed May 29th at 2pm  Your physician recommends that you schedule a follow-up appointment in: 4 months

## 2017-10-21 ENCOUNTER — Encounter: Payer: Self-pay | Admitting: Cardiology

## 2017-10-21 LAB — CUP PACEART REMOTE DEVICE CHECK
Brady Statistic AP VP Percent: 0.53 %
Brady Statistic AP VS Percent: 0.02 %
Brady Statistic AS VP Percent: 97.94 %
Brady Statistic RV Percent Paced: 10.01 %
Date Time Interrogation Session: 20190521014758
HIGH POWER IMPEDANCE MEASURED VALUE: 90 Ohm
Implantable Lead Implant Date: 20150126
Implantable Lead Implant Date: 20150126
Implantable Lead Location: 753858
Implantable Lead Model: 4396
Lead Channel Impedance Value: 456 Ohm
Lead Channel Impedance Value: 551 Ohm
Lead Channel Impedance Value: 551 Ohm
Lead Channel Impedance Value: 589 Ohm
Lead Channel Impedance Value: 988 Ohm
Lead Channel Pacing Threshold Amplitude: 0.625 V
Lead Channel Pacing Threshold Amplitude: 0.875 V
Lead Channel Pacing Threshold Pulse Width: 0.4 ms
Lead Channel Pacing Threshold Pulse Width: 0.4 ms
Lead Channel Sensing Intrinsic Amplitude: 1.375 mV
Lead Channel Setting Pacing Amplitude: 2 V
Lead Channel Setting Pacing Pulse Width: 0.4 ms
Lead Channel Setting Sensing Sensitivity: 0.3 mV
MDC IDC LEAD IMPLANT DT: 20150126
MDC IDC LEAD LOCATION: 753859
MDC IDC LEAD LOCATION: 753860
MDC IDC MSMT BATTERY REMAINING LONGEVITY: 28 mo
MDC IDC MSMT BATTERY VOLTAGE: 2.94 V
MDC IDC MSMT LEADCHNL RA IMPEDANCE VALUE: 456 Ohm
MDC IDC MSMT LEADCHNL RA SENSING INTR AMPL: 1.375 mV
MDC IDC MSMT LEADCHNL RV PACING THRESHOLD AMPLITUDE: 0.75 V
MDC IDC MSMT LEADCHNL RV PACING THRESHOLD PULSEWIDTH: 0.4 ms
MDC IDC MSMT LEADCHNL RV SENSING INTR AMPL: 13.375 mV
MDC IDC MSMT LEADCHNL RV SENSING INTR AMPL: 13.375 mV
MDC IDC PG IMPLANT DT: 20150126
MDC IDC SET LEADCHNL LV PACING AMPLITUDE: 2 V
MDC IDC SET LEADCHNL LV PACING PULSEWIDTH: 0.4 ms
MDC IDC SET LEADCHNL RV PACING AMPLITUDE: 2.5 V
MDC IDC STAT BRADY AS VS PERCENT: 1.51 %
MDC IDC STAT BRADY RA PERCENT PACED: 0.55 %

## 2017-10-21 NOTE — Progress Notes (Signed)
Remote ICD transmission.   

## 2017-10-26 ENCOUNTER — Ambulatory Visit (INDEPENDENT_AMBULATORY_CARE_PROVIDER_SITE_OTHER): Payer: Medicare HMO | Admitting: Internal Medicine

## 2017-10-26 ENCOUNTER — Encounter: Payer: Self-pay | Admitting: Internal Medicine

## 2017-10-26 VITALS — BP 140/99 | HR 70 | Temp 98.1°F | Ht 64.0 in | Wt 183.1 lb

## 2017-10-26 DIAGNOSIS — K7400 Hepatic fibrosis, unspecified: Secondary | ICD-10-CM | POA: Insufficient documentation

## 2017-10-26 DIAGNOSIS — Z21 Asymptomatic human immunodeficiency virus [HIV] infection status: Secondary | ICD-10-CM

## 2017-10-26 DIAGNOSIS — B182 Chronic viral hepatitis C: Secondary | ICD-10-CM | POA: Diagnosis not present

## 2017-10-26 DIAGNOSIS — R69 Illness, unspecified: Secondary | ICD-10-CM | POA: Diagnosis not present

## 2017-10-26 NOTE — Progress Notes (Signed)
   Subjective:    Patient ID: [name], male    DOB: February 26, 1966, 52 y.o.   MRN: [id number]  HPI Here for follow up of HIV. Continues on Fortine and denies any missed doses.  Also completed treatment for chronic hepatitis C.  F3/4 on elastography.  Continues to follow with heart failure clinic.  No associated n/v/d.  CD4 of 680 and viral load < 20.     Review of Systems  Constitutional: Negative for fatigue.  Gastrointestinal: Negative for diarrhea.  Skin: Negative for rash.       Objective:   Physical Exam  Constitutional: He appears well-developed and well-nourished. No distress.  HENT:  Mouth/Throat: No oropharyngeal exudate.  Eyes: No scleral icterus.  Cardiovascular: Normal rate, regular rhythm and normal heart sounds.  No murmur heard. Pulmonary/Chest: Effort normal and breath sounds normal. No respiratory distress.  Lymphadenopathy:    He has no cervical adenopathy.  Skin: No rash noted.    SH: no alcohol      Assessment & Plan:

## 2017-10-26 NOTE — Assessment & Plan Note (Signed)
Doing well.  Continue with the same and rtc 6 months.

## 2017-10-26 NOTE — Assessment & Plan Note (Signed)
EoT not detected.  Will recheck next visit for SVR 24 to confirm cure.

## 2017-10-26 NOTE — Assessment & Plan Note (Signed)
F3/4 on elastography, F4 on fibrosure.  Will recheck [place] next visit and consider GI input for ? EGD.

## 2017-10-27 ENCOUNTER — Ambulatory Visit (HOSPITAL_COMMUNITY)
Admission: RE | Admit: 2017-10-27 | Discharge: 2017-10-27 | Disposition: A | Discharge status: Home / Self Care | Payer: Medicare HMO | Source: Ambulatory Visit | Attending: Internal Medicine | Admitting: Internal Medicine

## 2017-10-27 DIAGNOSIS — I5022 Chronic systolic (congestive) heart failure: Secondary | ICD-10-CM | POA: Diagnosis not present

## 2017-10-27 LAB — BASIC METABOLIC PANEL
ANION GAP: 8 (ref 5–15)
BUN: 15 mg/dL (ref 6–20)
CHLORIDE: 110 mmol/L (ref 101–111)
CO2: 22 mmol/L (ref 22–32)
Calcium: 9.1 mg/dL (ref 8.9–10.3)
Creatinine, Ser: 1.34 mg/dL — ABNORMAL HIGH (ref 0.61–1.24)
GFR calc Af Amer: >60 mL/min (ref >60)
GFR, EST NON AFRICAN AMERICAN: 59 mL/min — AB (ref >60)
GLUCOSE: 96 mg/dL (ref 65–99)
POTASSIUM: 4.3 mmol/L (ref 3.5–5.1)
Sodium: 140 mmol/L (ref 135–145)

## 2017-10-28 ENCOUNTER — Telehealth (HOSPITAL_COMMUNITY): Payer: Self-pay | Admitting: *Deleted

## 2017-10-28 MED ORDER — AMIODARONE HCL 200 MG PO TABS: 100.0000 mg | ORAL_TABLET | Freq: Every day | ORAL | 11 refills | Status: DC

## 2017-10-28 NOTE — Telephone Encounter (Signed)
Pt aware and agreeable, med list update, also advised labs from this week were stable.

## 2017-10-28 NOTE — Telephone Encounter (Signed)
-----   Message from Dolores Patty, MD sent at 10/24/2017 10:06 PM EDT ----- Herbert Seta - please call him and ask him to reduce amio to 100mg  daily. Thanks

## 2017-11-14 ENCOUNTER — Other Ambulatory Visit: Payer: Self-pay | Admitting: Internal Medicine

## 2017-11-14 DIAGNOSIS — B20 Human immunodeficiency virus [HIV] disease: Secondary | ICD-10-CM

## 2017-11-15 ENCOUNTER — Other Ambulatory Visit: Payer: Self-pay | Admitting: *Deleted

## 2017-11-17 DIAGNOSIS — R2689 Other abnormalities of gait and mobility: Secondary | ICD-10-CM | POA: Diagnosis not present

## 2017-11-17 DIAGNOSIS — M6281 Muscle weakness (generalized): Secondary | ICD-10-CM | POA: Diagnosis not present

## 2017-11-17 DIAGNOSIS — S82851S Displaced trimalleolar fracture of right lower leg, sequela: Secondary | ICD-10-CM | POA: Diagnosis not present

## 2017-12-17 DIAGNOSIS — S82851S Displaced trimalleolar fracture of right lower leg, sequela: Secondary | ICD-10-CM | POA: Diagnosis not present

## 2017-12-17 DIAGNOSIS — R2689 Other abnormalities of gait and mobility: Secondary | ICD-10-CM | POA: Diagnosis not present

## 2017-12-17 DIAGNOSIS — M6281 Muscle weakness (generalized): Secondary | ICD-10-CM | POA: Diagnosis not present

## 2017-12-23 ENCOUNTER — Other Ambulatory Visit: Payer: Self-pay | Admitting: Family Medicine

## 2018-01-16 ENCOUNTER — Other Ambulatory Visit (HOSPITAL_COMMUNITY): Payer: Self-pay | Admitting: Internal Medicine

## 2018-01-17 DIAGNOSIS — S82851S Displaced trimalleolar fracture of right lower leg, sequela: Secondary | ICD-10-CM | POA: Diagnosis not present

## 2018-01-17 DIAGNOSIS — M6281 Muscle weakness (generalized): Secondary | ICD-10-CM | POA: Diagnosis not present

## 2018-01-17 DIAGNOSIS — R2689 Other abnormalities of gait and mobility: Secondary | ICD-10-CM | POA: Diagnosis not present

## 2018-01-18 ENCOUNTER — Ambulatory Visit (INDEPENDENT_AMBULATORY_CARE_PROVIDER_SITE_OTHER): Payer: Medicare HMO | Admitting: *Deleted

## 2018-01-18 DIAGNOSIS — I5022 Chronic systolic (congestive) heart failure: Secondary | ICD-10-CM | POA: Diagnosis not present

## 2018-01-18 DIAGNOSIS — I428 Other cardiomyopathies: Secondary | ICD-10-CM

## 2018-01-18 NOTE — Progress Notes (Signed)
Remote ICD transmission.   

## 2018-02-03 ENCOUNTER — Ambulatory Visit (HOSPITAL_COMMUNITY)
Admission: RE | Admit: 2018-02-03 | Discharge: 2018-02-03 | Disposition: A | Discharge status: Home / Self Care | Payer: Medicare HMO | Source: Ambulatory Visit | Attending: Internal Medicine | Admitting: Internal Medicine

## 2018-02-03 VITALS — BP 158/110 | HR 70 | Wt 191.0 lb

## 2018-02-03 DIAGNOSIS — I5022 Chronic systolic (congestive) heart failure: Secondary | ICD-10-CM | POA: Insufficient documentation

## 2018-02-03 DIAGNOSIS — F329 Major depressive disorder, single episode, unspecified: Secondary | ICD-10-CM | POA: Diagnosis not present

## 2018-02-03 DIAGNOSIS — I5082 Biventricular heart failure: Secondary | ICD-10-CM | POA: Insufficient documentation

## 2018-02-03 DIAGNOSIS — I472 Ventricular tachycardia, unspecified: Secondary | ICD-10-CM

## 2018-02-03 DIAGNOSIS — I10 Essential (primary) hypertension: Secondary | ICD-10-CM | POA: Diagnosis not present

## 2018-02-03 DIAGNOSIS — B1920 Unspecified viral hepatitis C without hepatic coma: Secondary | ICD-10-CM | POA: Insufficient documentation

## 2018-02-03 DIAGNOSIS — R69 Illness, unspecified: Secondary | ICD-10-CM | POA: Diagnosis not present

## 2018-02-03 DIAGNOSIS — B20 Human immunodeficiency virus [HIV] disease: Secondary | ICD-10-CM | POA: Diagnosis not present

## 2018-02-03 DIAGNOSIS — Z95810 Presence of automatic (implantable) cardiac defibrillator: Secondary | ICD-10-CM | POA: Diagnosis not present

## 2018-02-03 DIAGNOSIS — K219 Gastro-esophageal reflux disease without esophagitis: Secondary | ICD-10-CM | POA: Diagnosis not present

## 2018-02-03 DIAGNOSIS — F1010 Alcohol abuse, uncomplicated: Secondary | ICD-10-CM | POA: Insufficient documentation

## 2018-02-03 DIAGNOSIS — I429 Cardiomyopathy, unspecified: Secondary | ICD-10-CM | POA: Diagnosis not present

## 2018-02-03 DIAGNOSIS — Z79899 Other long term (current) drug therapy: Secondary | ICD-10-CM | POA: Diagnosis not present

## 2018-02-03 DIAGNOSIS — L280 Lichen simplex chronicus: Secondary | ICD-10-CM | POA: Diagnosis not present

## 2018-02-03 DIAGNOSIS — I110 Hypertensive heart disease with heart failure: Secondary | ICD-10-CM | POA: Diagnosis not present

## 2018-02-03 DIAGNOSIS — L281 Prurigo nodularis: Secondary | ICD-10-CM | POA: Diagnosis not present

## 2018-02-03 DIAGNOSIS — L810 Postinflammatory hyperpigmentation: Secondary | ICD-10-CM | POA: Diagnosis not present

## 2018-02-03 MED ORDER — SACUBITRIL-VALSARTAN 97-103 MG PO TABS: 1.0000 | ORAL_TABLET | Freq: Two times a day (BID) | ORAL | 3 refills | Status: DC

## 2018-02-03 MED ORDER — HYDRALAZINE HCL 25 MG PO TABS: 25.0000 mg | ORAL_TABLET | Freq: Three times a day (TID) | ORAL | 11 refills | Status: DC

## 2018-02-03 NOTE — Addendum Note (Signed)
Encounter addended by: Dolores Patty, MD on: 02/03/2018 3:52 PM  Actions taken: LOS modified, Charge Capture section accepted

## 2018-02-03 NOTE — Progress Notes (Signed)
Patient ID: BROADUS COSTILLA, male   DOB: 19-Jan-1966, 52 y.o.   MRN: [id number]   [facility]  Patient ID: TIMO HARTWIG, male   DOB: 03/24/1966, 52 y.o.   MRN: [id number]  ---Medtronic Optivol ----   Primary Physician: [name], MD  Primary Cardiologist: Dr Ladona Ridgel  Primary HF: Dr. [name]    HPI: ADRICK KESTLER is a 52 y.o. male with a history of HIV 1998, HCV, ETOH abuse and CHF due to NICM s/p Medtronic  CRT-D (06/2013). He also has a h/o syncope and previously had an EP study which was non-inducible for VT.    Was admitted 01/19/13- 01/22/13 for acute on chronic systolic HF. ECHO EF 20-25%. He was diuresed with IV lasix. He was discharged on coreg 6.25 mg BID, digoxin 0.25 mg daily, lisinopril 40 mg daily, and spironolactone 25 mg daily. Discharge weight 150 lbs. He stopped spironolactone due to severe headaches.  He was not able to tolerate titration of Coreg to 25 mg bid due to lightheadedness.   Admitted 8/27-8/31/18 for syncope from low BP resulting in trimalleolar fracture of the right ankle and left proximal tibia. He underwent ORIF of right trimalleolar fracture. HF meds and diuretics adjusted. He was discharged to SNF.   Today he presents for acute visit due to high BPs. Says BP has been running 150/110 for several days. Has been taking all his meds. Feels ok just anxious about his BP. Has gained a lot of weight. Denies SOB, orthopnea, PND or edema. No HA or focal neuro symptoms   ICD interrogated personally in clinic: No VT/AFib. Activity ~4 hours per day. Fluid well below threshold.   ECHO 04/2013: EF 20-25%, mod/severe MR, LA mod dilated ECHO 10/16/13 EF 20-25%  ECHO 03/14/15 EF 55% ECHO 12/17 EF 55% ECHO 8/18 EF 65-70%.   SH: Lives with partner in [place], drinking a wine cooler twice a week (prior heavy ETOH).   Review of systems complete and found to be negative unless listed in HPI.    Family History  Problem Relation Age of Onset  .  Lung cancer Mother   . Colon cancer Unknown 50   Past Medical History:  Diagnosis Date  . CHF (congestive heart failure) (HCC)   . Chronic systolic heart failure (HCC)    a. Echo 12/05/11:  EF 20-25%, diff HK with mild sparing of IL wall, mild AI, mod MR, mild LAE, mild RVE, mild reduced RVF.;  b.  Echo 05/11/2012:  Mild LVH, EF 20-25%, Gr 1 diast dysfn, mod MR, mild LAE  . Depression   . G6PD deficiency (HCC)   . GERD (gastroesophageal reflux disease)   . Hepatitis    Hep B and Hep C (patient does not report this but these are listed in previous notes.)  . HIV infection (HCC)   . Hypertension   . NICM (nonischemic cardiomyopathy) (HCC)    cardia CTA 8/13 negative for obstructive CAD  . VT (ventricular tachycardia) (HCC)     Current Outpatient Medications  Medication Sig Dispense Refill  . amiodarone (PACERONE) 200 MG tablet Take 0.5 tablets (100 mg total) by mouth daily. 15 tablet 11  . carvedilol (COREG) 25 MG tablet Take 1 tablet (25 mg total) 2 (two) times daily with a meal by mouth. 180 tablet 3  . clobetasol ointment (TEMOVATE) 0.05 % Apply 1 application topically 2 (two) times daily.  0  . colchicine 0.6 MG tablet Take 1 tablet (0.6 mg total) by mouth  2 (two) times daily as needed. For gout flares. 30 tablet 2  . Elbasvir-Grazoprevir (ZEPATIER) 50-100 MG TABS Take 1 tablet by mouth daily. 28 tablet 2  . famotidine (PEPCID) 20 MG tablet TAKE 1 TABLET(20 MG) BY MOUTH TWICE DAILY 180 tablet 0  . furosemide (LASIX) 40 MG tablet Take 1 tablet (40 mg total) by mouth as needed. 30 tablet 3  . gabapentin (NEURONTIN) 300 MG capsule Take 300 mg by mouth every evening.    . hydrALAZINE (APRESOLINE) 25 MG tablet Take 1 tablet (25 mg total) by mouth 3 (three) times daily. Do not take if your systolic blood pressure is less that 120. 120 tablet 11  . hydrOXYzine (ATARAX/VISTARIL) 50 MG tablet take 1 tablet by mouth three times a day if needed 15 tablet 1  . mirtazapine (REMERON) 7.5 MG tablet  Take 1 tablet (7.5 mg total) by mouth at bedtime. 30 tablet 5  . ODEFSEY 200-25-25 MG TABS tablet TAKE 1 TABLET BY MOUTH EVERY DAY WITH BREAKFAST 30 tablet 5  . ondansetron (ZOFRAN) 4 MG tablet Take 4 mg by mouth as needed.  0  . sacubitril-valsartan (ENTRESTO) 97-103 MG Take 1 tablet by mouth 2 (two) times daily. 60 tablet 3  . spironolactone (ALDACTONE) 25 MG tablet Take 1 tablet (25 mg total) by mouth at bedtime. 30 tablet 6  . TIVICAY 50 MG tablet TAKE 1 TABLET BY MOUTH ONCE DAILY WITH LUNCH 30 tablet 4  . triamcinolone cream (KENALOG) 0.1 % Apply 1 application topically 3 (three) times daily.  0  . valACYclovir (VALTREX) 1000 MG tablet Take 1 tablet (1,000 mg total) by mouth 2 (two) times daily as needed (outbreaks). Take for 5 days for an outbreak 10 tablet 6   No current facility-administered medications for this encounter.     Vitals:   02/03/18 1527  BP: (!) 158/110  Pulse: 70  SpO2: 98%  Weight: 86.6 kg (191 lb)   Wt Readings from Last 3 Encounters:  02/03/18 86.6 kg (191 lb)  10/26/17 83.1 kg (183 lb 1.9 oz)  10/20/17 84 kg (185 lb 1.9 oz)    Physical Exam:  General:  Well appearing. No resp difficulty HEENT: normal Neck: supple. no JVD. Carotids 2+ bilat; no bruits. No lymphadenopathy or thryomegaly appreciated. Cor: PMI nondisplaced. Regular rate & rhythm. No rubs, gallops or murmurs. Lungs: clear Abdomen: obese soft, nontender, nondistended. No hepatosplenomegaly. No bruits or masses. Good bowel sounds. Extremities: no cyanosis, clubbing, rash, edema Neuro: alert & orientedx3, cranial nerves grossly intact. moves all 4 extremities w/o difficulty. Affect pleasant   ASSESSMENT & PLAN:  1) HTN - BP markedly elevated in setting of weight gain - Increase Entresto to 97/103 bid - Start hydralazine 25 tid. Hold SBP < 120 - Encouraged weight loss and to avoid ETOH completely  2) Chronic systolic HF: Nonischemic cardiomyopathy, ?due to ETOH abuse. EF 55% (03/2015) s/p  Medtronic CRT-D (06/2013). 12/2016 ECHO EF 65-70%.  - NYHA I-II - ICD: Interrogated personally. No VT/AF. Fluid ok - Continue Coreg 3.125 mg BID.  - Continue spiro to 25 mg daily. .   - Increase Entresto to 97/103 mg BID.   3) Ventricular tachycardia:  - Continue Amio.  - Quiescent on ICD interrogation - Discussed the need to yearly eye exams.   4) HIV-  Followed by Dr Luciana Axe. Stable.   5) ETOH abuse - counseled on need for complete cessation  [name], MD  02/03/2018 3:24 PM

## 2018-02-03 NOTE — Patient Instructions (Addendum)
INCREASE Entresto to 97/103mg  twice daily.  Take Hydralazine 25mg  three times daily. **Do not take if your systolic blood pressure is less that 120**

## 2018-02-11 ENCOUNTER — Encounter

## 2018-02-11 ENCOUNTER — Other Ambulatory Visit (HOSPITAL_COMMUNITY): Payer: Self-pay

## 2018-02-11 MED ORDER — FUROSEMIDE 40 MG PO TABS: 40.0000 mg | ORAL_TABLET | ORAL | 5 refills | Status: DC | PRN

## 2018-02-17 ENCOUNTER — Encounter (HOSPITAL_COMMUNITY): Payer: Self-pay

## 2018-02-17 ENCOUNTER — Ambulatory Visit (HOSPITAL_COMMUNITY)
Admission: RE | Admit: 2018-02-17 | Discharge: 2018-02-17 | Disposition: A | Discharge status: Home / Self Care | Payer: No Typology Code available for payment source | Source: Ambulatory Visit | Attending: Internal Medicine | Admitting: Internal Medicine

## 2018-02-17 VITALS — BP 120/76 | HR 70 | Wt 191.4 lb

## 2018-02-17 DIAGNOSIS — S82851S Displaced trimalleolar fracture of right lower leg, sequela: Secondary | ICD-10-CM | POA: Diagnosis not present

## 2018-02-17 DIAGNOSIS — I5022 Chronic systolic (congestive) heart failure: Secondary | ICD-10-CM

## 2018-02-17 DIAGNOSIS — I472 Ventricular tachycardia: Secondary | ICD-10-CM | POA: Insufficient documentation

## 2018-02-17 DIAGNOSIS — I110 Hypertensive heart disease with heart failure: Secondary | ICD-10-CM | POA: Diagnosis not present

## 2018-02-17 DIAGNOSIS — B1920 Unspecified viral hepatitis C without hepatic coma: Secondary | ICD-10-CM | POA: Diagnosis not present

## 2018-02-17 DIAGNOSIS — I5082 Biventricular heart failure: Secondary | ICD-10-CM | POA: Insufficient documentation

## 2018-02-17 DIAGNOSIS — K219 Gastro-esophageal reflux disease without esophagitis: Secondary | ICD-10-CM | POA: Insufficient documentation

## 2018-02-17 DIAGNOSIS — Z21 Asymptomatic human immunodeficiency virus [HIV] infection status: Secondary | ICD-10-CM

## 2018-02-17 DIAGNOSIS — I428 Other cardiomyopathies: Secondary | ICD-10-CM | POA: Insufficient documentation

## 2018-02-17 DIAGNOSIS — I5023 Acute on chronic systolic (congestive) heart failure: Secondary | ICD-10-CM | POA: Diagnosis not present

## 2018-02-17 DIAGNOSIS — R69 Illness, unspecified: Secondary | ICD-10-CM | POA: Diagnosis not present

## 2018-02-17 DIAGNOSIS — I4729 Other ventricular tachycardia: Secondary | ICD-10-CM

## 2018-02-17 DIAGNOSIS — I10 Essential (primary) hypertension: Secondary | ICD-10-CM | POA: Diagnosis not present

## 2018-02-17 DIAGNOSIS — F1010 Alcohol abuse, uncomplicated: Secondary | ICD-10-CM

## 2018-02-17 DIAGNOSIS — B20 Human immunodeficiency virus [HIV] disease: Secondary | ICD-10-CM | POA: Diagnosis not present

## 2018-02-17 DIAGNOSIS — Z79899 Other long term (current) drug therapy: Secondary | ICD-10-CM | POA: Diagnosis not present

## 2018-02-17 DIAGNOSIS — F329 Major depressive disorder, single episode, unspecified: Secondary | ICD-10-CM | POA: Diagnosis not present

## 2018-02-17 DIAGNOSIS — M6281 Muscle weakness (generalized): Secondary | ICD-10-CM | POA: Diagnosis not present

## 2018-02-17 DIAGNOSIS — R2689 Other abnormalities of gait and mobility: Secondary | ICD-10-CM | POA: Diagnosis not present

## 2018-02-17 LAB — BASIC METABOLIC PANEL
ANION GAP: 9 (ref 5–15)
BUN: 17 mg/dL (ref 6–20)
CALCIUM: 9.4 mg/dL (ref 8.9–10.3)
CHLORIDE: 109 mmol/L (ref 98–111)
CO2: 23 mmol/L (ref 22–32)
Creatinine, Ser: 1.32 mg/dL — ABNORMAL HIGH (ref 0.61–1.24)
GFR calc non Af Amer: 60 mL/min — ABNORMAL LOW (ref >60)
Glucose, Bld: 115 mg/dL — ABNORMAL HIGH (ref 70–99)
Potassium: 4.3 mmol/L (ref 3.5–5.1)
Sodium: 141 mmol/L (ref 135–145)

## 2018-02-17 NOTE — Progress Notes (Signed)
Patient ID: [name], male   DOB: 1965/12/14, 52 y.o.   MRN: [id number]   [facility]  Patient ID: [name], male   DOB: 21-May-1966, 52 y.o.   MRN: [id number]  ---Medtronic Optivol ----   Primary Physician: [name], MD  Primary Cardiologist: Dr Ladona Ridgel  Primary HF: Dr. [name]    HPI: [name] is a 52 y.o. male with a history of HIV 1998, HCV, ETOH abuse and CHF due to NICM s/p Medtronic  CRT-D (06/2013). He also has a h/o syncope and previously had an EP study which was non-inducible for VT.    Was admitted 01/19/13- 01/22/13 for acute on chronic systolic HF. ECHO EF 20-25%. He was diuresed with IV lasix. He was discharged on coreg 6.25 mg BID, digoxin 0.25 mg daily, lisinopril 40 mg daily, and spironolactone 25 mg daily. Discharge weight 150 lbs. He stopped spironolactone due to severe headaches.  He was not able to tolerate titration of Coreg to 25 mg bid due to lightheadedness.   Admitted 8/27-8/31/18 for syncope from low BP resulting in trimalleolar fracture of the right ankle and left proximal tibia. He underwent ORIF of right trimalleolar fracture. HF meds and diuretics adjusted. He was discharged to SNF.   He presents today for regularly scheduled follow up. Seen several weeks ago with HTN. Hydral added. He feels better. He denies SOB, PND, orthopnea, or edema. No CP, HA, or focal neuro symptoms. He is asymptomatic, but remains anxious about his BP. States it still runs 140-150s systolic at home, but is great in clinic today. He uses a digital arm cuff. Taking all meds as directed.    Today he presents for acute visit due to high BPs. Says BP has been running 150/110 for several days. Has been taking all his meds. Feels ok just anxious about his BP. Has gained a lot of weight. Denies SOB, orthopnea, PND or edema. No HA or focal neuro symptoms   ICD interrogated personally in clinic: Thoracic impedence at baseline. No elevated optivol. Pt  activity > 4 hrs daily. No VT/VF. Personally reviewed.   ECHO 04/2013: EF 20-25%, mod/severe MR, LA mod dilated ECHO 10/16/13 EF 20-25%  ECHO 03/14/15 EF 55% ECHO 12/17 EF 55% ECHO 8/18 EF 65-70%.   SH: Lives with partner in [place], drinking a wine cooler twice a week (prior heavy ETOH).   Review of systems complete and found to be negative unless listed in HPI.    Family History  Problem Relation Age of Onset  . Lung cancer Mother   . Colon cancer Unknown 50   Past Medical History:  Diagnosis Date  . CHF (congestive heart failure) (HCC)   . Chronic systolic heart failure (HCC)    a. Echo 12/05/11:  EF 20-25%, diff HK with mild sparing of IL wall, mild AI, mod MR, mild LAE, mild RVE, mild reduced RVF.;  b.  Echo 05/11/2012:  Mild LVH, EF 20-25%, Gr 1 diast dysfn, mod MR, mild LAE  . Depression   . G6PD deficiency (HCC)   . GERD (gastroesophageal reflux disease)   . Hepatitis    Hep B and Hep C (patient does not report this but these are listed in previous notes.)  . HIV infection (HCC)   . Hypertension   . NICM (nonischemic cardiomyopathy) (HCC)    cardia CTA 8/13 negative for obstructive CAD  . VT (ventricular tachycardia) (HCC)     Current Outpatient Medications  Medication Sig Dispense  Refill  . amiodarone (PACERONE) 200 MG tablet Take 0.5 tablets (100 mg total) by mouth daily. 15 tablet 11  . carvedilol (COREG) 25 MG tablet Take 1 tablet (25 mg total) 2 (two) times daily with a meal by mouth. 180 tablet 3  . clobetasol ointment (TEMOVATE) 0.05 % Apply 1 application topically 2 (two) times daily.  0  . colchicine 0.6 MG tablet Take 1 tablet (0.6 mg total) by mouth 2 (two) times daily as needed. For gout flares. 30 tablet 2  . Elbasvir-Grazoprevir (ZEPATIER) 50-100 MG TABS Take 1 tablet by mouth daily. 28 tablet 2  . famotidine (PEPCID) 20 MG tablet TAKE 1 TABLET(20 MG) BY MOUTH TWICE DAILY 180 tablet 0  . furosemide (LASIX) 40 MG tablet Take 1 tablet (40 mg total) by  mouth as needed. 30 tablet 5  . gabapentin (NEURONTIN) 300 MG capsule Take 300 mg by mouth every evening.    . hydrALAZINE (APRESOLINE) 25 MG tablet Take 1 tablet (25 mg total) by mouth 3 (three) times daily. Do not take if your systolic blood pressure is less that 120. 120 tablet 11  . hydrOXYzine (ATARAX/VISTARIL) 50 MG tablet take 1 tablet by mouth three times a day if needed 15 tablet 1  . mirtazapine (REMERON) 7.5 MG tablet Take 1 tablet (7.5 mg total) by mouth at bedtime. 30 tablet 5  . ODEFSEY 200-25-25 MG TABS tablet TAKE 1 TABLET BY MOUTH EVERY DAY WITH BREAKFAST 30 tablet 5  . ondansetron (ZOFRAN) 4 MG tablet Take 4 mg by mouth as needed.  0  . sacubitril-valsartan (ENTRESTO) 97-103 MG Take 1 tablet by mouth 2 (two) times daily. 60 tablet 3  . spironolactone (ALDACTONE) 25 MG tablet Take 1 tablet (25 mg total) by mouth at bedtime. 30 tablet 6  . TIVICAY 50 MG tablet TAKE 1 TABLET BY MOUTH ONCE DAILY WITH LUNCH 30 tablet 4  . triamcinolone cream (KENALOG) 0.1 % Apply 1 application topically 3 (three) times daily.  0  . valACYclovir (VALTREX) 1000 MG tablet Take 1 tablet (1,000 mg total) by mouth 2 (two) times daily as needed (outbreaks). Take for 5 days for an outbreak 10 tablet 6   No current facility-administered medications for this encounter.     Vitals:   02/17/18 1117  BP: 120/76  Pulse: 70  SpO2: 97%  Weight: 86.8 kg (191 lb 6.4 oz)   Wt Readings from Last 3 Encounters:  02/17/18 86.8 kg (191 lb 6.4 oz)  02/03/18 86.6 kg (191 lb)  10/26/17 83.1 kg (183 lb 1.9 oz)    Physical Exam:  General: Well appearing. No resp difficulty. HEENT: Normal Neck: Supple. JVP 5-6. Carotids 2+ bilat; no bruits. No thyromegaly or nodule noted. Cor: PMI nondisplaced. RRR, No M/G/R noted Lungs: CTAB, normal effort. Abdomen: Soft, non-tender, non-distended, no HSM. No bruits or masses. +BS  Extremities: No cyanosis, clubbing, or rash. R and LLE no edema.  Neuro: Alert & orientedx3,  cranial nerves grossly intact. moves all 4 extremities w/o difficulty. Affect pleasant   ASSESSMENT & PLAN:  1) HTN - BP much improved with increased Entresto.  - Continue Entresto 97/103 mg BID - Continue hydralazine 25 tid. Hold SBP < 120 can take extra 25 mg x 1 if BP > 150 at home.  - Encouraged weight loss and ETOH cessation.  - RN visit next week to check his cuff against ours.   2) Chronic systolic HF; now with recovered EF: Nonischemic cardiomyopathy, ?due to ETOH abuse. EF  55% (03/2015) s/p Medtronic CRT-D (06/2013). 12/2016 ECHO EF 65-70%.  - NYHA II symptoms. Encouraged activity.  - Volume status stable on exam. And optivol.   - Continue Coreg 3.125 mg BID.  - Continue spiro to 25 mg daily.  - Continue Entresto 97/103 mg BID.  - Reinforced fluid restriction to < 2 L daily, sodium restriction to less than 2000 mg daily, and the importance of daily weights.    3) Ventricular tachycardia:  - Continue Amio.  - Quiescent on ICD interrogation - Discussed the need to yearly eye exams.   4) HIV - Stable.  - Followed by Dr Luciana Axe.   5) ETOH abuse - Encouraged complete cessation.  Doing well overall. Remains anxious about his HTN. Encouraged with stable readings in clinic. Meds as above. BMET today. RTC 4 months. Sooner with symptoms.   David Freer, PA-C  02/17/2018 11:23 AM   Greater than 50% of the 25 minute visit was spent in counseling/coordination of care regarding disease state education, salt/fluid restriction, sliding scale diuretics, and medication compliance.

## 2018-02-17 NOTE — Patient Instructions (Signed)
Labs today (will call for abnormal results, otherwise no news is good news)  May take extra hydralazine 25 mg (1 Tablet) if Systolic pressure is greater than 150, hold hydralazine if Systolic pressure is less than 120.    Please bring home BP cuff to nursing visit next week.   Follow up with Dr. [name] in 4 months, please call our clinic in December to schedule your January appointment. [phone], Option 3.

## 2018-02-17 NOTE — Progress Notes (Signed)
120/76 70   

## 2018-02-22 LAB — CUP PACEART REMOTE DEVICE CHECK
Battery Remaining Longevity: 26 mo
Battery Voltage: 2.94 V
Brady Statistic AS VP Percent: 98.47 %
Brady Statistic AS VS Percent: 1.45 %
Brady Statistic RA Percent Paced: 0.08 %
Brady Statistic RV Percent Paced: 7.52 %
HighPow Impedance: 90 Ohm
Implantable Lead Implant Date: 20150126
Implantable Lead Implant Date: 20150126
Implantable Lead Location: 753858
Implantable Lead Location: 753860
Implantable Lead Model: 4396
Implantable Lead Model: 5076
Implantable Lead Model: 6935
Implantable Pulse Generator Implant Date: 20150126
Lead Channel Impedance Value: 475 Ohm
Lead Channel Impedance Value: 589 Ohm
Lead Channel Impedance Value: 988 Ohm
Lead Channel Pacing Threshold Amplitude: 0.625 V
Lead Channel Pacing Threshold Amplitude: 0.625 V
Lead Channel Pacing Threshold Amplitude: 0.875 V
Lead Channel Pacing Threshold Pulse Width: 0.4 ms
Lead Channel Sensing Intrinsic Amplitude: 1 mV
Lead Channel Sensing Intrinsic Amplitude: 13 mV
Lead Channel Setting Pacing Amplitude: 2 V
Lead Channel Setting Pacing Pulse Width: 0.4 ms
Lead Channel Setting Sensing Sensitivity: 0.3 mV
MDC IDC LEAD IMPLANT DT: 20150126
MDC IDC LEAD LOCATION: 753859
MDC IDC MSMT LEADCHNL LV IMPEDANCE VALUE: 551 Ohm
MDC IDC MSMT LEADCHNL LV PACING THRESHOLD PULSEWIDTH: 0.4 ms
MDC IDC MSMT LEADCHNL RA PACING THRESHOLD PULSEWIDTH: 0.4 ms
MDC IDC MSMT LEADCHNL RA SENSING INTR AMPL: 1 mV
MDC IDC MSMT LEADCHNL RV IMPEDANCE VALUE: 475 Ohm
MDC IDC MSMT LEADCHNL RV IMPEDANCE VALUE: 532 Ohm
MDC IDC MSMT LEADCHNL RV SENSING INTR AMPL: 13 mV
MDC IDC SESS DTM: 20190820052303
MDC IDC SET LEADCHNL RA PACING AMPLITUDE: 2 V
MDC IDC SET LEADCHNL RV PACING AMPLITUDE: 2.5 V
MDC IDC SET LEADCHNL RV PACING PULSEWIDTH: 0.4 ms
MDC IDC STAT BRADY AP VP PERCENT: 0.06 %
MDC IDC STAT BRADY AP VS PERCENT: 0.02 %

## 2018-02-25 ENCOUNTER — Ambulatory Visit (HOSPITAL_COMMUNITY)
Admission: RE | Admit: 2018-02-25 | Discharge: 2018-02-25 | Disposition: A | Discharge status: Home / Self Care | Payer: No Typology Code available for payment source | Source: Ambulatory Visit | Attending: Cardiology | Admitting: Cardiology

## 2018-02-25 ENCOUNTER — Encounter (HOSPITAL_COMMUNITY): Payer: Self-pay

## 2018-02-25 DIAGNOSIS — Z0130 Encounter for examination of blood pressure without abnormal findings: Secondary | ICD-10-CM | POA: Diagnosis not present

## 2018-02-25 MED ORDER — HYDRALAZINE HCL 50 MG PO TABS: 50.0000 mg | ORAL_TABLET | Freq: Three times a day (TID) | ORAL | 11 refills | Status: DC

## 2018-02-25 NOTE — Patient Instructions (Signed)
INCREASE  Hydralazine to 50mg  three times daily

## 2018-02-25 NOTE — Progress Notes (Signed)
Pt seen today for bp check. bp was still elevated. Per Amy increase hydralazine to 50mg  tid with bp check in 2 weeks. Pt aware and agreeable with plan.

## 2018-03-10 ENCOUNTER — Ambulatory Visit (HOSPITAL_COMMUNITY)
Admission: RE | Admit: 2018-03-10 | Discharge: 2018-03-10 | Disposition: A | Discharge status: Home / Self Care | Payer: Medicare HMO | Source: Ambulatory Visit | Attending: Internal Medicine | Admitting: Internal Medicine

## 2018-03-10 ENCOUNTER — Ambulatory Visit (INDEPENDENT_AMBULATORY_CARE_PROVIDER_SITE_OTHER): Payer: Medicare HMO

## 2018-03-10 DIAGNOSIS — Z23 Encounter for immunization: Secondary | ICD-10-CM | POA: Diagnosis not present

## 2018-03-10 DIAGNOSIS — Z0130 Encounter for examination of blood pressure without abnormal findings: Secondary | ICD-10-CM | POA: Insufficient documentation

## 2018-03-19 DIAGNOSIS — R2689 Other abnormalities of gait and mobility: Secondary | ICD-10-CM | POA: Diagnosis not present

## 2018-03-19 DIAGNOSIS — S82851S Displaced trimalleolar fracture of right lower leg, sequela: Secondary | ICD-10-CM | POA: Diagnosis not present

## 2018-03-19 DIAGNOSIS — M6281 Muscle weakness (generalized): Secondary | ICD-10-CM | POA: Diagnosis not present

## 2018-03-26 ENCOUNTER — Other Ambulatory Visit: Payer: Self-pay | Admitting: Family Medicine

## 2018-04-04 ENCOUNTER — Other Ambulatory Visit: Payer: Self-pay | Admitting: Internal Medicine

## 2018-04-04 DIAGNOSIS — B20 Human immunodeficiency virus [HIV] disease: Secondary | ICD-10-CM

## 2018-04-11 ENCOUNTER — Other Ambulatory Visit: Payer: Medicare HMO

## 2018-04-11 DIAGNOSIS — B182 Chronic viral hepatitis C: Secondary | ICD-10-CM | POA: Diagnosis not present

## 2018-04-11 DIAGNOSIS — R69 Illness, unspecified: Secondary | ICD-10-CM | POA: Diagnosis not present

## 2018-04-11 DIAGNOSIS — Z21 Asymptomatic human immunodeficiency virus [HIV] infection status: Secondary | ICD-10-CM

## 2018-04-12 LAB — T-HELPER CELL (CD4) - (RCID CLINIC ONLY)
CD4 % Helper T Cell: 22 % — ABNORMAL LOW (ref 33–55)
CD4 T CELL ABS: 430 /uL (ref 400–2700)

## 2018-04-13 LAB — HIV-1 RNA QUANT-NO REFLEX-BLD
HIV 1 RNA Quant: <20 copies/mL
HIV-1 RNA Quant, Log: <1.3 Log copies/mL

## 2018-04-13 LAB — HEPATITIS C RNA QUANTITATIVE
HCV Quantitative Log: <1.18 Log IU/mL
HCV RNA, PCR, QN: NOT DETECTED [IU]/mL

## 2018-04-18 ENCOUNTER — Encounter: Payer: Self-pay | Admitting: Family Medicine

## 2018-04-18 ENCOUNTER — Telehealth: Payer: Self-pay | Admitting: *Deleted

## 2018-04-18 ENCOUNTER — Ambulatory Visit (INDEPENDENT_AMBULATORY_CARE_PROVIDER_SITE_OTHER): Payer: Medicare HMO | Admitting: Family Medicine

## 2018-04-18 ENCOUNTER — Other Ambulatory Visit: Payer: Self-pay

## 2018-04-18 ENCOUNTER — Ambulatory Visit
Admission: RE | Admit: 2018-04-18 | Discharge: 2018-04-18 | Disposition: A | Discharge status: Home / Self Care | Payer: Medicare HMO | Source: Ambulatory Visit | Attending: Family Medicine | Admitting: Family Medicine

## 2018-04-18 VITALS — BP 104/80 | HR 73 | Temp 97.9°F | Ht 64.0 in | Wt 195.0 lb

## 2018-04-18 DIAGNOSIS — M25512 Pain in left shoulder: Secondary | ICD-10-CM | POA: Diagnosis not present

## 2018-04-18 DIAGNOSIS — G8929 Other chronic pain: Secondary | ICD-10-CM

## 2018-04-18 MED ORDER — DICLOFENAC SODIUM 1 % TD GEL: 2.0000 g | Freq: Four times a day (QID) | TRANSDERMAL | 1 refills | Status: DC

## 2018-04-18 NOTE — Patient Instructions (Addendum)
You will be called by physical therapy  It was wonderful to see you today.  Thank you for choosing [facility].   Please call [phone] with any questions about today's appointment.  Please be sure to schedule follow up at the front  desk before you leave today.   Terisa Starr, MD  Family Medicine

## 2018-04-18 NOTE — Telephone Encounter (Signed)
Received fax from pharmacy, PA needed on voltaren gel.  Clinical questions submitted via Cover My Meds.  Waiting on response, could take up to 72 hours.  Cover My Meds info: Key: [id number]   Brennyn Haisley, Maryjo Rochester, CMA

## 2018-04-18 NOTE — Progress Notes (Signed)
  Patient Name: [name] Date of Birth: 1965/06/24 Date of Visit: 04/18/18 PCP: Wendee Beavers, DO  Chief Complaint: Shoulder pain  Subjective: [name] is a pleasant 52 y.o. year old heart failure with reduced ejection fraction, HIV, hepatitis C with sustained viral response after therapy, and syncope related to hypotension presenting today for left shoulder pain. This has been present for one month. Endorses anterior and superior left shoulder pain. This occurs at two specific times: - When sleeping and rolls onto left shoulder  - With overhead ROM  He denies associated chest pain, palpitations, or dyspnea. He is able to walk 2 miles without stopping at this time--he has been working on his exercise tolerance. He has tried Aleve (nightly), ibuprofen (daily), and Tylenol. He reports he was unaware he should not take NSAIDS (HFrEF and CKD). Endorses some pain radiation to his left hand. He is RHD. Denies neck pain, headaches, hand numbness or weakness.   ROS:  ROS Negative except for as above.  I have reviewed the patient's medical, surgical, family, and social history as appropriate.   Vitals:   04/18/18 0855  BP: 104/80  Pulse: 73  Temp: 97.9 F (36.6 C)  SpO2: 96%   Filed Weights   04/18/18 0855  Weight: 195 lb (88.5 kg)   HEENT: Sclera anicteric. Dentition is moderate. Appears well hydrated. Neck: Supple Cardiac: Regular rate and rhythm. Normal S1/S2. No murmurs, rubs, or gallops appreciated. Lungs: Clear bilaterally to ascultation.  Abdomen: Normoactive bowel sounds. No tenderness to deep or light palpation. No rebound or guarding.  Extremities: Warm, well perfused without edema.  Skin: Warm, dry Psych: Pleasant and appropriate  Shoulder:  Bilateral supraclavicular fat pads noted TTP along left acromion process and biceps tendon PROM Full passive ROM without click or crepitus Active ROM- limited in abduction and forward flexion by pain Reduced  supraspinatus strength + Hawkins Negative Spurlings   [name] was seen today for left shoulder pain.  Diagnoses and all orders for this visit:  Chronic left shoulder pain differential includes impingement syndrome or subacromial bursitis, I believe this is most, given the nocturnal nature of the pain when he rolled onto this and the pain with overhead range of motion.  Less likely osteoarthritis given examination.  No symptoms of angina, the patient is walking more than he ever has and is chest pain-free.  No symptoms of decompensated heart failure.  His pacemaker is not been bothering him either.  Will refer to physical therapy if not improving will consider referral to sports medicine for a subacromial injection -     Basic Metabolic Panel -     DG Shoulder Left; Future -     Ambulatory referral to Physical Therapy -     diclofenac sodium (VOLTAREN) 1 % GEL; Apply 2 g topically 4 (four) times daily. - Apply a compressive ACE bandage. Rest and elevate the affected painful area.  Apply cold compresses intermittently as needed.  As pain recedes, begin normal activities slowly as tolerated.  Call if symptoms persist.  CKD Discussed that he cannot/should not use oral NSAIDS. Reviewed at length. Recommended standing Tylenol. Apply cold compresses intermittently as needed.  Apply head at night as recommended. Tylenol three times a day. Recommended ROM exercises, demonstrated these for the patient.   HCM  UTD in vaccines and CSY    Terisa Starr, MD  [facility] Medicine Teaching Service

## 2018-04-19 ENCOUNTER — Telehealth: Payer: Self-pay

## 2018-04-19 ENCOUNTER — Ambulatory Visit (INDEPENDENT_AMBULATORY_CARE_PROVIDER_SITE_OTHER): Payer: Medicare HMO

## 2018-04-19 DIAGNOSIS — I428 Other cardiomyopathies: Secondary | ICD-10-CM | POA: Diagnosis not present

## 2018-04-19 LAB — BASIC METABOLIC PANEL
BUN / CREAT RATIO: 11 (ref 9–20)
BUN: 17 mg/dL (ref 6–24)
CHLORIDE: 108 mmol/L — AB (ref 96–106)
CO2: 20 mmol/L (ref 20–29)
Calcium: 9.3 mg/dL (ref 8.7–10.2)
Creatinine, Ser: 1.52 mg/dL — ABNORMAL HIGH (ref 0.76–1.27)
GFR calc non Af Amer: 51 mL/min/{1.73_m2} — ABNORMAL LOW (ref >59)
GFR, EST AFRICAN AMERICAN: 59 mL/min/{1.73_m2} — AB (ref >59)
GLUCOSE: 100 mg/dL — AB (ref 65–99)
Potassium: 4.4 mmol/L (ref 3.5–5.2)
Sodium: 143 mmol/L (ref 134–144)

## 2018-04-19 NOTE — Telephone Encounter (Signed)
LMOVM reminding pt to send remote transmission.   

## 2018-04-19 NOTE — Telephone Encounter (Signed)
Pt called stating he had a missed call from our office. Told pt there weren't any phone notes saying someone called. Told pt I would send message back anyways. Please call pt back.

## 2018-04-20 NOTE — Telephone Encounter (Signed)
Sometimes if the provider calls for a PEER TO PEER review, it gets approved.   Usually they send you a fax with the number, if that has not happened. You can call the number provided in the previous message. Briell Paulette, Maryjo [place], CMA

## 2018-04-20 NOTE — Progress Notes (Signed)
Remote ICD transmission.   

## 2018-04-25 ENCOUNTER — Ambulatory Visit (INDEPENDENT_AMBULATORY_CARE_PROVIDER_SITE_OTHER): Payer: Medicare HMO | Admitting: Internal Medicine

## 2018-04-25 ENCOUNTER — Other Ambulatory Visit: Payer: Self-pay | Admitting: Family Medicine

## 2018-04-25 ENCOUNTER — Encounter: Payer: Self-pay | Admitting: Internal Medicine

## 2018-04-25 VITALS — BP 148/95 | HR 67 | Temp 97.6°F | Ht 64.0 in | Wt 192.0 lb

## 2018-04-25 DIAGNOSIS — K7400 Hepatic fibrosis, unspecified: Secondary | ICD-10-CM

## 2018-04-25 DIAGNOSIS — Z23 Encounter for immunization: Secondary | ICD-10-CM | POA: Insufficient documentation

## 2018-04-25 DIAGNOSIS — Z21 Asymptomatic human immunodeficiency virus [HIV] infection status: Secondary | ICD-10-CM

## 2018-04-25 DIAGNOSIS — G4700 Insomnia, unspecified: Secondary | ICD-10-CM

## 2018-04-25 DIAGNOSIS — Z113 Encounter for screening for infections with a predominantly sexual mode of transmission: Secondary | ICD-10-CM

## 2018-04-25 DIAGNOSIS — R69 Illness, unspecified: Secondary | ICD-10-CM | POA: Diagnosis not present

## 2018-04-25 DIAGNOSIS — B182 Chronic viral hepatitis C: Secondary | ICD-10-CM | POA: Diagnosis not present

## 2018-04-25 MED ORDER — MIRTAZAPINE 7.5 MG PO TABS: 7.5000 mg | ORAL_TABLET | Freq: Every day | ORAL | 5 refills | Status: DC

## 2018-04-25 NOTE — Assessment & Plan Note (Signed)
Noted and now considered cured.

## 2018-04-25 NOTE — Assessment & Plan Note (Signed)
Doing well, no changes.  rtc 6 months.  

## 2018-04-25 NOTE — Addendum Note (Signed)
Addended by: Gildardo Griffes on: 04/25/2018 12:15 PM   Modules accepted: Orders

## 2018-04-25 NOTE — Assessment & Plan Note (Signed)
I disucssed the results and need to continue screening.  I discussed avoiding alcohol with liver damage to prevent cirrhosis.  He though is unwilling to stop drinking.

## 2018-04-25 NOTE — Telephone Encounter (Signed)
Patient needs peer to peer authorization for Voltaren gel asap if you can.  Please call patient either way today please.  Patient also was not sure why did not get x-ray results yet.  (She received them from Dr. Luciana Axe today).   (Best number [phone])

## 2018-04-25 NOTE — Assessment & Plan Note (Signed)
Discussed Prevnar and given today 

## 2018-04-25 NOTE — Progress Notes (Signed)
   Subjective:    Patient ID: AYSEN PIECHOWSKI, male    DOB: 07/13/1965, 52 y.o.   MRN: [id number]  HPI Here for follow up of HIV. Continues on [place] and denies any missed doses.  Also completed treatment for chronic hepatitis C and SVR 24 negative.  F3/4 on elastography.  No cirrhosis on ultrasound.  Continues to follow with heart failure clinic.  No associated n/v/d.  CD4 of 430 and viral load < 20.     Review of Systems  Constitutional: Negative for fatigue.  Gastrointestinal: Negative for diarrhea.  Skin: Negative for rash.       Objective:   Physical Exam  Constitutional: He appears well-developed and well-nourished. No distress.  HENT:  Mouth/Throat: No oropharyngeal exudate.  Eyes: No scleral icterus.  Cardiovascular: Normal rate, regular rhythm and normal heart sounds.  No murmur heard. Pulmonary/Chest: Effort normal and breath sounds normal. No respiratory distress.  Lymphadenopathy:    He has no cervical adenopathy.  Skin: No rash noted.    SH: no alcohol      Assessment & Plan:

## 2018-04-26 MED ORDER — DICLOFENAC SODIUM 1.5 % TD SOLN: TRANSDERMAL | 3 refills | Status: DC

## 2018-04-26 NOTE — Addendum Note (Signed)
Addended by: Manson Passey,  on: 04/26/2018 05:19 PM   Modules accepted: Orders

## 2018-04-26 NOTE — Telephone Encounter (Signed)
Called patient---requested necessary information for peer to peer. Patient reported he would like to try another medication. Diclofenac solution (topical) prescribed.

## 2018-04-27 ENCOUNTER — Telehealth: Payer: Self-pay

## 2018-04-27 ENCOUNTER — Other Ambulatory Visit: Payer: Self-pay | Admitting: Internal Medicine

## 2018-04-27 NOTE — Telephone Encounter (Signed)
Received fax from [facility] pharmacy requesting prior authorization of Diclofenac solution. Clinical info submitted via CoverMyMeds. Status pending. Response may take 72 hours.  Key  [id number]  Ples Specter, RN [facility] RN)

## 2018-05-02 NOTE — Telephone Encounter (Signed)
Approved.  Pharmacy informed. [place]eger, Maryjo Rochester, CMA

## 2018-05-03 ENCOUNTER — Other Ambulatory Visit: Payer: Self-pay | Admitting: Internal Medicine

## 2018-05-03 DIAGNOSIS — B20 Human immunodeficiency virus [HIV] disease: Secondary | ICD-10-CM

## 2018-05-04 ENCOUNTER — Ambulatory Visit: Payer: Medicare HMO | Admitting: Physical Therapy

## 2018-05-06 ENCOUNTER — Other Ambulatory Visit: Payer: Medicare HMO

## 2018-05-08 ENCOUNTER — Other Ambulatory Visit (HOSPITAL_COMMUNITY): Payer: Self-pay | Admitting: Internal Medicine

## 2018-05-10 ENCOUNTER — Other Ambulatory Visit: Payer: Medicare HMO

## 2018-05-16 ENCOUNTER — Encounter: Payer: Self-pay | Admitting: Physical Therapy

## 2018-05-16 ENCOUNTER — Other Ambulatory Visit: Payer: Self-pay

## 2018-05-16 ENCOUNTER — Ambulatory Visit: Payer: Medicare HMO | Attending: Family Medicine | Admitting: Physical Therapy

## 2018-05-16 DIAGNOSIS — M25612 Stiffness of left shoulder, not elsewhere classified: Secondary | ICD-10-CM | POA: Diagnosis not present

## 2018-05-16 DIAGNOSIS — G8929 Other chronic pain: Secondary | ICD-10-CM

## 2018-05-16 DIAGNOSIS — M62838 Other muscle spasm: Secondary | ICD-10-CM

## 2018-05-16 DIAGNOSIS — M25512 Pain in left shoulder: Secondary | ICD-10-CM | POA: Insufficient documentation

## 2018-05-17 ENCOUNTER — Ambulatory Visit
Admission: RE | Admit: 2018-05-17 | Discharge: 2018-05-17 | Disposition: A | Discharge status: Home / Self Care | Payer: Medicare HMO | Source: Ambulatory Visit | Attending: Internal Medicine | Admitting: Internal Medicine

## 2018-05-17 DIAGNOSIS — K7400 Hepatic fibrosis, unspecified: Secondary | ICD-10-CM

## 2018-05-17 DIAGNOSIS — K8020 Calculus of gallbladder without cholecystitis without obstruction: Secondary | ICD-10-CM | POA: Diagnosis not present

## 2018-05-18 ENCOUNTER — Encounter: Payer: Self-pay | Admitting: Physical Therapy

## 2018-05-18 NOTE — Patient Instructions (Signed)
Upper trap stretch   Levator stretch  Tennis ball

## 2018-05-18 NOTE — Therapy (Signed)
[facility] [address] [place], [place], [address] Phone: [phone]   Fax:  [phone]  Physical Therapy Evaluation  Patient Details  Name: [name] MRN: [id number] Date of Birth: [birth year] Referring Provider (PT): Dr Terisa Starr   Encounter Date: 05/16/2018    Past Medical History:  Diagnosis Date  . CHF (congestive heart failure) (HCC)   . Chronic systolic heart failure (HCC)    a. Echo 12/05/11:  EF 20-25%, diff HK with mild sparing of IL wall, mild AI, mod MR, mild LAE, mild RVE, mild reduced RVF.;  b.  Echo 05/11/2012:  Mild LVH, EF 20-25%, Gr 1 diast dysfn, mod MR, mild LAE  . Depression   . G6PD deficiency   . GERD (gastroesophageal reflux disease)   . Hepatitis    Hep B and Hep C (patient does not report this but these are listed in previous notes.)  . HIV infection (HCC)   . Hypertension   . NICM (nonischemic cardiomyopathy) (HCC)    cardia CTA 8/13 negative for obstructive CAD  . VT (ventricular tachycardia) (HCC)     Past Surgical History:  Procedure Laterality Date  . BI-VENTRICULAR PACEMAKER INSERTION N/A 06/26/2013   Procedure: BI-VENTRICULAR PACEMAKER INSERTION (CRT-P);  Surgeon: Marinus Maw, MD;  Location: [facility] CATH LAB;  Service: Cardiovascular;  Laterality: N/A;  . COLONOSCOPY WITH PROPOFOL N/A 08/14/2016   Procedure: COLONOSCOPY WITH PROPOFOL;  Surgeon: Jeani Hawking, MD;  Location: [facility] ENDOSCOPY;  Service: Endoscopy;  Laterality: N/A;  . ELECTROPHYSIOLOGY STUDY N/A 02/19/2012   Procedure: ELECTROPHYSIOLOGY STUDY;  Surgeon: Marinus Maw, MD;  Location[facility] CATH LAB;  Service: Cardiovascular;  Laterality: N/A;  . ESOPHAGOGASTRODUODENOSCOPY  12/07/2011   Procedure: ESOPHAGOGASTRODUODENOSCOPY (EGD);  Surgeon: [name], MD,FACG;  Location: [facility] ENDOSCOPY;  Service: Endoscopy;  Laterality: N/A;  . FINGER SURGERY     Thumb laceration.    Marland Kitchen LEFT HEART CATHETERIZATION WITH CORONARY ANGIOGRAM N/A  01/31/2014   Procedure: LEFT HEART CATHETERIZATION WITH CORONARY ANGIOGRAM;  Surgeon: Iran Ouch, MD;  Location: [facility] CATH LAB;  Service: Cardiovascular;  Laterality: N/A;  . ORIF ANKLE FRACTURE Right 01/26/2017   Procedure: OPEN REDUCTION INTERNAL FIXATION (ORIF) ANKLE FRACTURE;  Surgeon: Sheral Apley, MD;  Location: [facility] OR;  Service: Orthopedics;  Laterality: Right;    There were no vitals filed for this visit.   Subjective Assessment - 05/18/18 1231    Subjective  Patient has been having left shoulder pain for about two months. The pain radiates from his neck into his left arm to his elbow. He also has a pacemaker.  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     Pertinent History  Heart failure; HIV; Pacemaker;     Limitations  House hold activities    Diagnostic tests  X-ray: negative for arthritis     Patient Stated Goals  to be able to use his arm     Currently in Pain?  Yes    Pain Score  8     Pain Location  Shoulder    Pain Orientation  Left    Pain Descriptors / Indicators  Aching    Pain Type  Chronic pain    Pain Onset  More than a month ago    Pain Frequency  Constant    Aggravating Factors   using the arm     Pain Relieving Factors  nothing     Effect of Pain on Daily Activities  difficulty using his left arm daily taks          [facility] PT Assessment - 05/18/18 0001      Assessment   Medical Diagnosis  Left HSoulder Pain     Referring Provider (PT)  Dr Terisa Starr    Onset Date/Surgical Date  --   Over two months    Hand Dominance  Right    Next MD Visit  Nothing Scheduled     Prior Therapy  Nothing at this time       Precautions   Precautions  None      Restrictions   Weight Bearing Restrictions  No      Balance Screen   Has the patient fallen in the past 6 months  No    Has the patient had a decrease in activity level because of a fear of falling?   No    Is the patient reluctant to leave their home because of a fear of falling?   No       Home Environment   Additional Comments  nothing pertinant       Prior Function   Level of Independence  Independent    Vocation  On disability    Leisure  cutting hair       Cognition   Overall Cognitive Status  Within Functional Limits for tasks assessed    Attention  Focused    Focused Attention  Appears intact    Memory  Appears intact    Awareness  Appears intact    Problem Solving  Appears intact      Sensation   Light Touch  Appears Intact    Additional Comments  pain that radiates into the elbow       Coordination   Gross Motor Movements are Fluid and Coordinated  Yes    Fine Motor Movements are Fluid and Coordinated  Yes      AROM   Overall AROM Comments  reporduction of pain with left cervical rotation and left side bend     Left Shoulder Flexion  110 Degrees   with pain    Left Shoulder Internal Rotation  --   Painful rreaching to T-12    Left Shoulder External Rotation  --   painful but can reach the back of his head     PROM   Overall PROM Comments  Pain with end range flexion left; pain with left ER at 60 degrees       Strength   Overall Strength Comments  weak grip on the left compared to right. Will measure next visit     Strength Assessment Site  Shoulder    Right/Left Shoulder  Left    Left Shoulder Flexion  3+/5    Left Shoulder Internal Rotation  3+/5    Left Shoulder External Rotation  3+/5      Palpation   Palpation comment  reporduced pain down the arm when palpating large spasm of the levator; tenderness to palpation in the upper trap; tenderness to palpation in the pec;       Special Tests   Other special tests  hawkins (+); empty can(+)                 Objective measurements completed on examination: See above findings.      [facility] Adult PT Treatment/Exercise - 05/18/18 0001      Neck Exercises: Seated   Other Seated Exercise  scap retraction x10; tennis ball trigger  point release       Neck Exercises: Stretches   Upper  Trapezius Stretch Limitations  2x20 sec hold     Levator Stretch Limitations  2x20 sec hold              PT Education - 05/18/18 1232    Education Details  symptom mangement     Person(s) Educated  Patient    Methods  Explanation;Demonstration;Tactile cues;Verbal cues    Comprehension  Verbalized understanding;Returned demonstration;Verbal cues required;Tactile cues required       PT Short Term Goals - 05/18/18 1324      PT SHORT TERM GOAL #1   Title  Patient will increase active shoulder flexion by 20 degrees     Time  3    Period  Weeks    Status  New    Target Date  06/15/18      PT SHORT TERM GOAL #2   Title  Patient will report no radiating pain into the left arm     Time  3    Period  Weeks    Status  New    Target Date  06/15/18      PT SHORT TERM GOAL #3   Title  Patient will be independent with inital HEP     Time  3    Period  Weeks    Status  New    Target Date  06/15/18        PT Long Term Goals - 05/18/18 1326      PT LONG TERM GOAL #1   Title  Patient will reach to a shelf with a 2 pound weight without pain     Time  6    Period  Weeks    Status  New    Target Date  07/13/18      PT LONG TERM GOAL #2   Title  Patient will reach behind his back without pain in order to perfrom ADL's     Time  6    Period  Weeks    Status  New      PT LONG TERM GOAL #3   Title  Patient will reach behind his head without pain in order to do his hair     Time  6    Period  Weeks    Status  New    Target Date  06/29/18             Plan - 05/18/18 1233    Clinical Impression Statement  Patient is a 52 year old male with left anterior shoulder pain that radiates into his shoulder and down to his elbow. He has significant spasming of his left upper trap and tenderness to palpation into his bicpes groove, and pec. He has pain with end range motion of the left UE. Sings and symptoms are consitent with left shoulder AC bursitis and also cervical  radiculopathy. He has increased radicular symptoms with cervical flexion and left cervical sidebending. He was given light stretching and self soft tissue mobilization. He would benefit from skilled therapy to decrease left UE pain and improve function of the left UE.     History and Personal Factors relevant to plan of care:  pacemaker     Clinical Presentation  Evolving    Clinical Decision Making  Moderate    Rehab Potential  Good    PT Frequency  2x / week    PT Duration  8 weeks  PT Treatment/Interventions  ADLs/Self Care Home Management;Cryotherapy;Electrical Stimulation;Iontophoresis 4mg /ml Dexamethasone;Ultrasound;Moist Heat;Therapeutic activities;Therapeutic exercise;Neuromuscular re-education;Patient/family education;Passive range of motion;Taping    PT Next Visit Plan  soft tissue mobilization to upper trap/ periscapular area/ pec tendon area; be aware of his pacemaker; Gnetle mobilization of the shoulder; consider wand flexion; consdier pendulums but it may effect his neck; any kind of light activity he can tolerate.     Consulted and Agree with Plan of Care  Patient       Patient will benefit from skilled therapeutic intervention in order to improve the following deficits and impairments:  Pain, Postural dysfunction, Decreased activity tolerance, Decreased endurance, Decreased range of motion, Decreased strength  Visit Diagnosis: Chronic left shoulder pain  Stiffness of left shoulder, not elsewhere classified  Other muscle spasm     Problem List Patient Active Problem List   Diagnosis Date Noted  . Screening examination for sexually transmitted disease 04/25/2018  . Need for prophylactic vaccination against Streptococcus pneumoniae (pneumococcus) 04/25/2018  . Liver fibrosis 10/26/2017  . Nonischemic cardiomyopathy (HCC)   . Syncope 01/25/2017  . HIV (human immunodeficiency virus infection) (HCC) 01/25/2017  . Encounter for long-term (current) use of medications  03/21/2015  . Ventricular tachycardia, polymorphic (HCC) 02/03/2014  . PTSD (post-traumatic stress disorder) 02/03/2014  . Sustained VT (ventricular tachycardia) (HCC) 01/27/2014  . ICD (implantable cardioverter-defibrillator), biventricular, in situ 01/23/2014  . Paroxysmal ventricular fibrillation (HCC) 01/19/2014  . Protein-calorie malnutrition, severe (HCC) 02/13/2013  . GERD (gastroesophageal reflux disease) 08/19/2012  . Chronic systolic heart failure (HCC) 01/25/2012  . Wide-complex tachycardia (HCC) 01/07/2012  . Alcohol abuse 11/30/2011  . Marijuana abuse 11/30/2011  . Genital herpes 06/09/2006  . Chronic hepatitis C without hepatic coma (HCC) 06/09/2006  . Primary hypertension 06/09/2006    Dessie Coma PT DPT  05/18/2018, 1:37 PM  [facility] [address] [place], [place], [address] Phone: [phone]   Fax:  [phone]  Name: ZAION WEERS MRN: [id number] Date of Birth: 11-Jul-1965

## 2018-05-26 ENCOUNTER — Other Ambulatory Visit (HOSPITAL_COMMUNITY): Payer: Self-pay | Admitting: Internal Medicine

## 2018-05-30 ENCOUNTER — Other Ambulatory Visit: Payer: Self-pay

## 2018-05-30 ENCOUNTER — Encounter: Payer: Self-pay | Admitting: Family Medicine

## 2018-05-30 ENCOUNTER — Ambulatory Visit (INDEPENDENT_AMBULATORY_CARE_PROVIDER_SITE_OTHER): Payer: Medicare HMO | Admitting: Family Medicine

## 2018-05-30 ENCOUNTER — Ambulatory Visit
Admission: RE | Admit: 2018-05-30 | Discharge: 2018-05-30 | Disposition: A | Discharge status: Home / Self Care | Payer: Medicare HMO | Source: Ambulatory Visit | Attending: Family Medicine | Admitting: Family Medicine

## 2018-05-30 VITALS — BP 122/80 | HR 67 | Temp 98.0°F | Wt 193.6 lb

## 2018-05-30 DIAGNOSIS — M25512 Pain in left shoulder: Secondary | ICD-10-CM

## 2018-05-30 DIAGNOSIS — G8929 Other chronic pain: Secondary | ICD-10-CM

## 2018-05-30 DIAGNOSIS — M542 Cervicalgia: Secondary | ICD-10-CM | POA: Diagnosis not present

## 2018-05-30 MED ORDER — DICLOFENAC SODIUM 1 % TD GEL: 2.0000 g | Freq: Four times a day (QID) | TRANSDERMAL | 11 refills | Status: DC

## 2018-05-30 NOTE — Progress Notes (Signed)
  Patient Name: [name] Date of Birth: [birth year] Date of Visit: 05/30/18 PCP: Wendee Beavers, DO  Chief Complaint: neck pain   Subjective: [name] is a pleasant 52 y.o. year old gentleman with a history of nonischemic cardiomyopathy status post ICD placement, HIV and G6PDH deficiency presenting today for routine follow-up for left shoulder pain.  He is attended 1 session of physical therapy.  He reports marked improvement in his shoulder pain itself from the diclofenac solution that his insurance approved.  He reports his new symptom is muscle spasm in his bilateral neck. No radiation of pain.  This is triggered by lifting his arms overhead.  It is also triggered by moving his neck in forward flexion and extension.  He denies arm or hand numbness, tingling, weakness. No association with exertion---he is still walking regularly and doing well with this.  He has been taking Tylenol less than 2 g/day and using diclofenac topical solution.  He has multiple upcoming physical therapy sessions scheduled.   ROS:  ROS Negative for chest pain, dyspnea, abdominal pain, headaches I have reviewed the patient's medical, surgical, family, and social history as appropriate.   Vitals:   05/30/18 1036  BP: 122/80  Pulse: 67  Temp: 98 F (36.7 C)  SpO2: 96%   Filed Weights   05/30/18 1036  Weight: 193 lb 9.6 oz (87.8 kg)  HEENT: Sclera anicteric. Dentition is moderate. Appears well hydrated. Neck: Supple Cardiac: Regular rate and rhythm. Normal S1/S2. No murmurs, rubs, or gallops appreciated. Lungs: Clear bilaterally to ascultation.  MSK TTP along left trapezius + Spurling on left 5/5 UE strength in bilateral hands, forearms Improved ROM of left shoulder with abduction as compared to prior  Extremities: Warm, well perfused without edema.  Skin: Warm, dry Psych: Pleasant and appropriate    [name] was seen today for left shoulder pain.  Diagnoses and all orders for this  visit:  Chronic left shoulder pain, patient and PT concerned for component of neck pain, + Spurlings, some muscle spasm in trapezius. Recommended heat and ice to neck- alternating twice daily. Will try Voltaren gel---solution difficult to apply. Consider referral to Sports Med if not improving.  -     DG Cervical Spine Complete; Future -     diclofenac sodium (VOLTAREN) 1 % GEL; Apply 2 g topically 4 (four) times daily.  HCM Patient has a history of herpes zoster--- could still receive Shingarix in future.   He will follow up with PCP.   Terisa Starr, MD  Family Medicine Teaching Service

## 2018-05-30 NOTE — Patient Instructions (Addendum)
It was wonderful to see you today.  Thank you for choosing [facility].   Please call [phone] with any questions about today's appointment.  Please be sure to schedule follow up at the front  desk before you leave today.   Terisa Starr, MD  Family Medicine   Follow up in 3 months with Dr. Abelardo Diesel

## 2018-06-02 ENCOUNTER — Other Ambulatory Visit (HOSPITAL_COMMUNITY): Payer: Self-pay | Admitting: Internal Medicine

## 2018-06-06 ENCOUNTER — Other Ambulatory Visit (HOSPITAL_COMMUNITY): Payer: Self-pay | Admitting: Internal Medicine

## 2018-06-06 ENCOUNTER — Encounter: Payer: Self-pay | Admitting: Physical Therapy

## 2018-06-06 ENCOUNTER — Ambulatory Visit: Payer: Medicare HMO | Attending: Family Medicine | Admitting: Physical Therapy

## 2018-06-06 DIAGNOSIS — M25512 Pain in left shoulder: Secondary | ICD-10-CM | POA: Diagnosis not present

## 2018-06-06 DIAGNOSIS — G8929 Other chronic pain: Secondary | ICD-10-CM | POA: Diagnosis not present

## 2018-06-06 DIAGNOSIS — M25612 Stiffness of left shoulder, not elsewhere classified: Secondary | ICD-10-CM

## 2018-06-06 DIAGNOSIS — M62838 Other muscle spasm: Secondary | ICD-10-CM | POA: Diagnosis not present

## 2018-06-06 NOTE — Patient Instructions (Signed)

## 2018-06-06 NOTE — Therapy (Signed)
[facility] [address] [place], [place], [address] Phone: [phone]   Fax:  [phone]  Physical Therapy Treatment  Patient Details  Name: [name] MRN: [id number] Date of Birth: 12/10/1965 Referring Provider (PT): Dr Terisa Starr   Encounter Date: 06/06/2018  PT End of Session - 06/06/18 1554    Visit Number  2    Number of Visits  12    Date for PT Re-Evaluation  06/27/18    Authorization Type  [employer]ET[employer] Medicare     PT Start Time  1502    PT Stop Time  1545    PT Time Calculation (min)  43 min    Activity Tolerance  Patient tolerated treatment well    Behavior During Therapy  [facility] for tasks assessed/performed       Past Medical History:  Diagnosis Date  . CHF (congestive heart failure) (HCC)   . Chronic systolic heart failure (HCC)    a. Echo 12/05/11:  EF 20-25%, diff HK with mild sparing of IL wall, mild AI, mod MR, mild LAE, mild RVE, mild reduced RVF.;  b.  Echo 05/11/2012:  Mild LVH, EF 20-25%, Gr 1 diast dysfn, mod MR, mild LAE  . Depression   . G6PD deficiency   . GERD (gastroesophageal reflux disease)   . Hepatitis    Hep B and Hep C (patient does not report this but these are listed in previous notes.)  . HIV infection (HCC)   . Hypertension   . NICM (nonischemic cardiomyopathy) (HCC)    cardia CTA 8/13 negative for obstructive CAD  . VT (ventricular tachycardia) (HCC)     Past Surgical History:  Procedure Laterality Date  . BI-VENTRICULAR PACEMAKER INSERTION N/A 06/26/2013   Procedure: BI-VENTRICULAR PACEMAKER INSERTION (CRT-P);  Surgeon: Marinus Maw, MD;  Location: [facility] CATH LAB;  Service: Cardiovascular;  Laterality: N/A;  . COLONOSCOPY WITH PROPOFOL N/A 08/14/2016   Procedure: COLONOSCOPY WITH PROPOFOL;  Surgeon: Jeani Hawking, MD;  Location: [facility] ENDOSCOPY;  Service: Endoscopy;  Laterality: N/A;  . ELECTROPHYSIOLOGY STUDY N/A 02/19/2012   Procedure: ELECTROPHYSIOLOGY STUDY;  Surgeon: Marinus Maw,  MD;  Location[facility] CATH LAB;  Service: Cardiovascular;  Laterality: N/A;  . ESOPHAGOGASTRODUODENOSCOPY  12/07/2011   Procedure: ESOPHAGOGASTRODUODENOSCOPY (EGD);  Surgeon: [name], MD,FACG;  Location: [facility] ENDOSCOPY;  Service: Endoscopy;  Laterality: N/A;  . FINGER SURGERY     Thumb laceration.    Marland Kitchen LEFT HEART CATHETERIZATION WITH CORONARY ANGIOGRAM N/A 01/31/2014   Procedure: LEFT HEART CATHETERIZATION WITH CORONARY ANGIOGRAM;  Surgeon: Iran Ouch, MD;  Location: [facility] CATH LAB;  Service: Cardiovascular;  Laterality: N/A;  . ORIF ANKLE FRACTURE Right 01/26/2017   Procedure: OPEN REDUCTION INTERNAL FIXATION (ORIF) ANKLE FRACTURE;  Surgeon: Sheral Apley, MD;  Location: [facility] OR;  Service: Orthopedics;  Laterality: Right;    There were no vitals filed for this visit.  Subjective Assessment - 06/06/18 1510    Subjective  Had an xray and neck has arthritis.   No sleep last night    Currently in Pain?  Yes    Pain Score  10-Worst pain ever    Pain Location  Shoulder    Pain Orientation  Left;Right    Pain Descriptors / Indicators  Aching;Throbbing    Pain Type  Chronic pain    Pain Radiating Towards  upper arms to elbow right  and left    Pain Frequency  Constant    Aggravating Factors   shoulder  abduction,  turning head hurts. Drinking beer    Pain Relieving Factors  tennis ball,   warm shower     Effect of Pain on Daily Activities    ADl difficult,  trouble sleeping.                        [facility] Adult PT Treatment/Exercise - 06/06/18 0001      Self-Care   ADL's  sitting posture. sleeping posture pillow positioning  Handout issued      Shoulder Exercises: Supine   Horizontal ABduction  AAROM;Both;10 reps    Horizontal ABduction Limitations  cane cued for pain free    External Rotation  10 reps;Both    External Rotation Limitations  cued    Flexion  AAROM;Both;10 reps    Flexion Limitations  cane  mild soreness anterior shoulder   ROM  WNL, showed how to do  with fingers laced.      Shoulder Exercises: Seated   Retraction  Both;10 reps    Retraction Limitations  cued      Manual Therapy   Manual Therapy  Soft tissue mobilization    Manual therapy comments  no pain post manual   myofascial gliding left anterior shoulder avoided pacemaker   Soft tissue mobilization  upper traps  medial scapular border,  teres area trigger point release,  also upper trap  tissue softened,  instrument assist at times.               PT Education - 06/06/18 1553    Education Details  self care, exercise form     Person(s) Educated  Patient    Methods  Explanation;Demonstration;Tactile cues;Verbal cues;Handout    Comprehension  Returned demonstration;Verbalized understanding;Need further instruction       PT Short Term Goals - 05/18/18 1324      PT SHORT TERM GOAL #1   Title  Patient will increase active shoulder flexion by 20 degrees     Time  3    Period  Weeks    Status  New    Target Date  06/15/18      PT SHORT TERM GOAL #2   Title  Patient will report no radiating pain into the left arm     Time  3    Period  Weeks    Status  New    Target Date  06/15/18      PT SHORT TERM GOAL #3   Title  Patient will be independent with inital HEP     Time  3    Period  Weeks    Status  New    Target Date  06/15/18        PT Long Term Goals - 05/18/18 1326      PT LONG TERM GOAL #1   Title  Patient will reach to a shelf with a 2 pound weight without pain     Time  6    Period  Weeks    Status  New    Target Date  07/13/18      PT LONG TERM GOAL #2   Title  Patient will reach behind his back without pain in order to perfrom ADL's     Time  6    Period  Weeks    Status  New      PT LONG TERM GOAL #3   Title  Patient will reach behind his head without pain in order to  do his hair     Time  6    Period  Weeks    Status  New    Target Date  06/29/18            Plan - 06/06/18 1554    Clinical Impression Statement  No pain at  end of session sitting shoulder flexion and ER WNL.  Education on posture and manual helpful.      PT Next Visit Plan  soft tissue mobilization to upper trap/ periscapular area/ pec tendon area; be aware of his pacemaker; Gnetle mobilization of the shoulder; consider wand flexion; consdier pendulums but it may effect his neck; any kind of light activity he can tolerate.     PT Home Exercise Plan  work on posture     Consulted and Agree with Plan of Care  Patient       Patient will benefit from skilled therapeutic intervention in order to improve the following deficits and impairments:     Visit Diagnosis: Chronic left shoulder pain  Stiffness of left shoulder, not elsewhere classified  Other muscle spasm     Problem List Patient Active Problem List   Diagnosis Date Noted  . Screening examination for sexually transmitted disease 04/25/2018  . Need for prophylactic vaccination against Streptococcus pneumoniae (pneumococcus) 04/25/2018  . Liver fibrosis 10/26/2017  . Nonischemic cardiomyopathy (HCC)   . Syncope 01/25/2017  . HIV (human immunodeficiency virus infection) (HCC) 01/25/2017  . Encounter for long-term (current) use of medications 03/21/2015  . Ventricular tachycardia, polymorphic (HCC) 02/03/2014  . PTSD (post-traumatic stress disorder) 02/03/2014  . Sustained VT (ventricular tachycardia) (HCC) 01/27/2014  . ICD (implantable cardioverter-defibrillator), biventricular, in situ 01/23/2014  . Paroxysmal ventricular fibrillation (HCC) 01/19/2014  . Protein-calorie malnutrition, severe (HCC) 02/13/2013  . GERD (gastroesophageal reflux disease) 08/19/2012  . Chronic systolic heart failure (HCC) 01/25/2012  . Wide-complex tachycardia (HCC) 01/07/2012  . Alcohol abuse 11/30/2011  . Marijuana abuse 11/30/2011  . Genital herpes 06/09/2006  . Chronic hepatitis C without hepatic coma (HCC) 06/09/2006  . Primary hypertension 06/09/2006    ,  PTA 06/06/2018, 3:57  PM  [facility] [address] [place], [place], [address] Phone: [phone]   Fax:  [phone]  Name: [name] MRN: [id number] Date of Birth: 11/08/1965

## 2018-06-09 ENCOUNTER — Other Ambulatory Visit: Payer: Self-pay | Admitting: Nurse Practitioner

## 2018-06-09 NOTE — Telephone Encounter (Signed)
This is a CHF pt 

## 2018-06-14 ENCOUNTER — Ambulatory Visit: Payer: Medicare HMO | Admitting: Physical Therapy

## 2018-06-14 ENCOUNTER — Encounter: Payer: Self-pay | Admitting: Physical Therapy

## 2018-06-14 DIAGNOSIS — M25612 Stiffness of left shoulder, not elsewhere classified: Secondary | ICD-10-CM

## 2018-06-14 DIAGNOSIS — G8929 Other chronic pain: Secondary | ICD-10-CM

## 2018-06-14 DIAGNOSIS — M62838 Other muscle spasm: Secondary | ICD-10-CM

## 2018-06-14 DIAGNOSIS — M25512 Pain in left shoulder: Secondary | ICD-10-CM | POA: Diagnosis not present

## 2018-06-14 NOTE — Therapy (Signed)
[facility] [address] [place], [place], [address] Phone: [phone]   Fax:  [phone]  Physical Therapy Treatment  Patient Details  Name: [name] MRN: [id number] Date of Birth: April 09, 1966 Referring Provider (PT): Dr Terisa Starr   Encounter Date: 06/14/2018  PT End of Session - 06/14/18 1345    Visit Number  3    Number of Visits  12    Date for PT Re-Evaluation  06/27/18    Authorization Type  [employer]ET[employer] Medicare     PT Start Time  1015    PT Stop Time  1056    PT Time Calculation (min)  41 min    Activity Tolerance  Patient tolerated treatment well    Behavior During Therapy  [facility] for tasks assessed/performed       Past Medical History:  Diagnosis Date  . CHF (congestive heart failure) (HCC)   . Chronic systolic heart failure (HCC)    a. Echo 12/05/11:  EF 20-25%, diff HK with mild sparing of IL wall, mild AI, mod MR, mild LAE, mild RVE, mild reduced RVF.;  b.  Echo 05/11/2012:  Mild LVH, EF 20-25%, Gr 1 diast dysfn, mod MR, mild LAE  . Depression   . G6PD deficiency   . GERD (gastroesophageal reflux disease)   . Hepatitis    Hep B and Hep C (patient does not report this but these are listed in previous notes.)  . HIV infection (HCC)   . Hypertension   . NICM (nonischemic cardiomyopathy) (HCC)    cardia CTA 8/13 negative for obstructive CAD  . VT (ventricular tachycardia) (HCC)     Past Surgical History:  Procedure Laterality Date  . BI-VENTRICULAR PACEMAKER INSERTION N/A 06/26/2013   Procedure: BI-VENTRICULAR PACEMAKER INSERTION (CRT-P);  Surgeon: Marinus Maw, MD;  Location: [facility] CATH LAB;  Service: Cardiovascular;  Laterality: N/A;  . COLONOSCOPY WITH PROPOFOL N/A 08/14/2016   Procedure: COLONOSCOPY WITH PROPOFOL;  Surgeon: Jeani Hawking, MD;  Location: [facility] ENDOSCOPY;  Service: Endoscopy;  Laterality: N/A;  . ELECTROPHYSIOLOGY STUDY N/A 02/19/2012   Procedure: ELECTROPHYSIOLOGY STUDY;  Surgeon: Marinus Maw, MD;  Location[facility] CATH LAB;  Service: Cardiovascular;  Laterality: N/A;  . ESOPHAGOGASTRODUODENOSCOPY  12/07/2011   Procedure: ESOPHAGOGASTRODUODENOSCOPY (EGD);  Surgeon: [name], MD,FACG;  Location: [facility] ENDOSCOPY;  Service: Endoscopy;  Laterality: N/A;  . FINGER SURGERY     Thumb laceration.    Marland Kitchen LEFT HEART CATHETERIZATION WITH CORONARY ANGIOGRAM N/A 01/31/2014   Procedure: LEFT HEART CATHETERIZATION WITH CORONARY ANGIOGRAM;  Surgeon: Iran Ouch, MD;  Location: [facility] CATH LAB;  Service: Cardiovascular;  Laterality: N/A;  . ORIF ANKLE FRACTURE Right 01/26/2017   Procedure: OPEN REDUCTION INTERNAL FIXATION (ORIF) ANKLE FRACTURE;  Surgeon: Sheral Apley, MD;  Location: [facility] OR;  Service: Orthopedics;  Laterality: Right;    There were no vitals filed for this visit.  Subjective Assessment - 06/14/18 1016    Subjective  Patient reports that his shoulder is sore today. He reports it hasnt been hurting as bad overall but now it is hurting really bad. He can not remember doing anything to the shoulder     Pertinent History  Heart failure; HIV; Pacemaker;     Limitations  House hold activities    Diagnostic tests  X-ray: negative for arthritis     Patient Stated Goals  to be able to use his arm     Currently in Pain?  Yes    Pain  Score  9     Pain Location  Shoulder    Pain Orientation  Right;Left    Pain Descriptors / Indicators  Aching    Pain Type  Chronic pain    Pain Onset  More than a month ago    Pain Frequency  Constant    Aggravating Factors   shoulder, abduction,     Pain Relieving Factors  warm shower     Effect of Pain on Daily Activities  trouble sleeping                        [facility] Adult PT Treatment/Exercise - 06/14/18 0001      Shoulder Exercises: Supine   External Rotation  10 reps;Both;AAROM    External Rotation Limitations  wand     Flexion  AAROM;Both;10 reps    Flexion Limitations  cane  mild soreness anterior shoulder   ROM  WNL,  showed how to do with fingers laced.      Shoulder Exercises: Standing   Extension  20 reps    Theraband Level (Shoulder Extension)  Level 1 (Yellow)    Row  20 reps    Theraband Level (Shoulder Row)  Level 1 (Yellow)      Manual Therapy   Manual Therapy  Passive ROM;Joint mobilization;Soft tissue mobilization    Joint Mobilization  inferior glides and PA mobilization;    Soft tissue mobilization  to upper traps and peri-scapular area; pec release with shoulder abduction     Passive ROM  emphasis on flexion and ER              PT Education - 06/14/18 1343    Education Details  updated HEP. symptom mangement     Person(s) Educated  Patient    Methods  Explanation;Demonstration;Tactile cues;Verbal cues    Comprehension  Verbalized understanding;Returned demonstration;Verbal cues required;Tactile cues required       PT Short Term Goals - 06/14/18 1350      PT SHORT TERM GOAL #1   Title  Patient will increase active shoulder flexion by 20 degrees     Time  3    Period  Weeks    Status  On-going      PT SHORT TERM GOAL #2   Title  Patient will report no radiating pain into the left arm     Time  3    Period  Weeks    Status  On-going      PT SHORT TERM GOAL #3   Title  Patient will be independent with inital HEP     Time  3    Period  Weeks    Status  On-going        PT Long Term Goals - 05/18/18 1326      PT LONG TERM GOAL #1   Title  Patient will reach to a shelf with a 2 pound weight without pain     Time  6    Period  Weeks    Status  New    Target Date  07/13/18      PT LONG TERM GOAL #2   Title  Patient will reach behind his back without pain in order to perfrom ADL's     Time  6    Period  Weeks    Status  New      PT LONG TERM GOAL #3   Title  Patient will reach behind his head without  pain in order to do his hair     Time  6    Period  Weeks    Status  New    Target Date  06/29/18            Plan - 06/14/18 1348    Clinical  Impression Statement  Patient shoulder motion improved per visual inspection. He was able to tolerate ther-ex. He was strongly encouraged to continue with his stretching and exercises. He had decrease spasming in his upper trap compared to intial evalation.     Clinical Presentation  Evolving    Clinical Decision Making  Moderate    Rehab Potential  Good    PT Frequency  2x / week    PT Duration  8 weeks    PT Treatment/Interventions  ADLs/Self Care Home Management;Cryotherapy;Electrical Stimulation;Iontophoresis 4mg /ml Dexamethasone;Ultrasound;Moist Heat;Therapeutic activities;Therapeutic exercise;Neuromuscular re-education;Patient/family education;Passive range of motion;Taping    PT Next Visit Plan  soft tissue mobilization to upper trap/ periscapular area/ pec tendon area; be aware of his pacemaker; Gnetle mobilization of the shoulder; consider wand flexion; consdier pendulums but it may effect his neck; any kind of light activity he can tolerate.     PT Home Exercise Plan  work on posture     Consulted and Agree with Plan of Care  Patient       Patient will benefit from skilled therapeutic intervention in order to improve the following deficits and impairments:  Pain, Postural dysfunction, Decreased activity tolerance, Decreased endurance, Decreased range of motion, Decreased strength  Visit Diagnosis: Chronic left shoulder pain  Stiffness of left shoulder, not elsewhere classified  Other muscle spasm     Problem List Patient Active Problem List   Diagnosis Date Noted  . Screening examination for sexually transmitted disease 04/25/2018  . Need for prophylactic vaccination against Streptococcus pneumoniae (pneumococcus) 04/25/2018  . Liver fibrosis 10/26/2017  . Nonischemic cardiomyopathy (HCC)   . Syncope 01/25/2017  . HIV (human immunodeficiency virus infection) (HCC) 01/25/2017  . Encounter for long-term (current) use of medications 03/21/2015  . Ventricular tachycardia,  polymorphic (HCC) 02/03/2014  . PTSD (post-traumatic stress disorder) 02/03/2014  . Sustained VT (ventricular tachycardia) (HCC) 01/27/2014  . ICD (implantable cardioverter-defibrillator), biventricular, in situ 01/23/2014  . Paroxysmal ventricular fibrillation (HCC) 01/19/2014  . Protein-calorie malnutrition, severe (HCC) 02/13/2013  . GERD (gastroesophageal reflux disease) 08/19/2012  . Chronic systolic heart failure (HCC) 01/25/2012  . Wide-complex tachycardia (HCC) 01/07/2012  . Alcohol abuse 11/30/2011  . Marijuana abuse 11/30/2011  . Genital herpes 06/09/2006  . Chronic hepatitis C without hepatic coma (HCC) 06/09/2006  . Primary hypertension 06/09/2006    Dessie Coma PT DPT  06/14/2018, 1:56 PM   [facility] [address] [place], [place], [address] Phone: [phone]   Fax:  [phone]  Name: [name] MRN: [id number] Date of Birth: Oct 26, 1965

## 2018-06-15 ENCOUNTER — Other Ambulatory Visit: Payer: Self-pay

## 2018-06-15 ENCOUNTER — Encounter (HOSPITAL_COMMUNITY): Payer: Self-pay

## 2018-06-15 ENCOUNTER — Ambulatory Visit (HOSPITAL_COMMUNITY)
Admission: RE | Admit: 2018-06-15 | Discharge: 2018-06-15 | Disposition: A | Discharge status: Home / Self Care | Payer: Medicare HMO | Source: Ambulatory Visit | Attending: Cardiology | Admitting: Cardiology

## 2018-06-15 VITALS — BP 182/124 | HR 73 | Wt 194.2 lb

## 2018-06-15 DIAGNOSIS — I5082 Biventricular heart failure: Secondary | ICD-10-CM | POA: Diagnosis not present

## 2018-06-15 DIAGNOSIS — Z79899 Other long term (current) drug therapy: Secondary | ICD-10-CM | POA: Diagnosis not present

## 2018-06-15 DIAGNOSIS — I5023 Acute on chronic systolic (congestive) heart failure: Secondary | ICD-10-CM | POA: Diagnosis not present

## 2018-06-15 DIAGNOSIS — F1010 Alcohol abuse, uncomplicated: Secondary | ICD-10-CM | POA: Diagnosis not present

## 2018-06-15 DIAGNOSIS — I472 Ventricular tachycardia: Secondary | ICD-10-CM | POA: Diagnosis not present

## 2018-06-15 DIAGNOSIS — F329 Major depressive disorder, single episode, unspecified: Secondary | ICD-10-CM | POA: Insufficient documentation

## 2018-06-15 DIAGNOSIS — I4729 Other ventricular tachycardia: Secondary | ICD-10-CM

## 2018-06-15 DIAGNOSIS — I10 Essential (primary) hypertension: Secondary | ICD-10-CM

## 2018-06-15 DIAGNOSIS — K219 Gastro-esophageal reflux disease without esophagitis: Secondary | ICD-10-CM | POA: Diagnosis not present

## 2018-06-15 DIAGNOSIS — B20 Human immunodeficiency virus [HIV] disease: Secondary | ICD-10-CM | POA: Insufficient documentation

## 2018-06-15 DIAGNOSIS — I110 Hypertensive heart disease with heart failure: Secondary | ICD-10-CM | POA: Insufficient documentation

## 2018-06-15 DIAGNOSIS — I5022 Chronic systolic (congestive) heart failure: Secondary | ICD-10-CM | POA: Diagnosis not present

## 2018-06-15 DIAGNOSIS — I428 Other cardiomyopathies: Secondary | ICD-10-CM | POA: Diagnosis not present

## 2018-06-15 DIAGNOSIS — Z7901 Long term (current) use of anticoagulants: Secondary | ICD-10-CM | POA: Insufficient documentation

## 2018-06-15 DIAGNOSIS — R69 Illness, unspecified: Secondary | ICD-10-CM | POA: Diagnosis not present

## 2018-06-15 LAB — CUP PACEART REMOTE DEVICE CHECK
Battery Remaining Longevity: 26 mo
Battery Voltage: 2.94 V
Brady Statistic AP VP Percent: 0.04 %
Brady Statistic RA Percent Paced: 0.06 %
Brady Statistic RV Percent Paced: 7.26 %
Date Time Interrogation Session: 20191119193357
HighPow Impedance: 79 Ohm
Implantable Lead Implant Date: 20150126
Implantable Lead Implant Date: 20150126
Implantable Lead Location: 753859
Implantable Lead Location: 753860
Implantable Lead Model: 4396
Implantable Lead Model: 5076
Implantable Lead Model: 6935
Implantable Pulse Generator Implant Date: 20150126
Lead Channel Impedance Value: 399 Ohm
Lead Channel Impedance Value: 399 Ohm
Lead Channel Impedance Value: 456 Ohm
Lead Channel Impedance Value: 456 Ohm
Lead Channel Impedance Value: 532 Ohm
Lead Channel Impedance Value: 874 Ohm
Lead Channel Pacing Threshold Amplitude: 0.625 V
Lead Channel Pacing Threshold Amplitude: 0.75 V
Lead Channel Pacing Threshold Pulse Width: 0.4 ms
Lead Channel Pacing Threshold Pulse Width: 0.4 ms
Lead Channel Pacing Threshold Pulse Width: 0.4 ms
Lead Channel Sensing Intrinsic Amplitude: 1.125 mV
Lead Channel Sensing Intrinsic Amplitude: 1.125 mV
Lead Channel Sensing Intrinsic Amplitude: 13.125 mV
Lead Channel Setting Pacing Amplitude: 2 V
Lead Channel Setting Pacing Amplitude: 2 V
Lead Channel Setting Pacing Amplitude: 2.5 V
Lead Channel Setting Pacing Pulse Width: 0.4 ms
Lead Channel Setting Sensing Sensitivity: 0.3 mV
MDC IDC LEAD IMPLANT DT: 20150126
MDC IDC LEAD LOCATION: 753858
MDC IDC MSMT LEADCHNL LV PACING THRESHOLD AMPLITUDE: 0.875 V
MDC IDC MSMT LEADCHNL RV SENSING INTR AMPL: 13.125 mV
MDC IDC SET LEADCHNL LV PACING PULSEWIDTH: 0.4 ms
MDC IDC STAT BRADY AP VS PERCENT: 0.01 %
MDC IDC STAT BRADY AS VP PERCENT: 98.55 %
MDC IDC STAT BRADY AS VS PERCENT: 1.39 %

## 2018-06-15 NOTE — Patient Instructions (Signed)
Your physician recommends that you schedule a follow-up appointment in: 6 months with an ECHO and with Dr. [name].  Please call in May to schedule your follow up.

## 2018-06-15 NOTE — Progress Notes (Signed)
Patient ID: David Ruddimothy L Nass, male   DOB: 01-Sep-1965, 53 y.o.   MRN: [id number]   [facility]  Patient ID: David Ruddimothy L Mamone, male   DOB: 01-Sep-1965, 53 y.o.   MRN: [id number]  ---Medtronic Optivol ----   Primary Physician: Staci RighterOMER, ROBERT, MD  Primary Cardiologist: Dr Ladona Ridgelaylor  Primary HF: Dr. Gala RomneyBensimhon    HPI: [name] is a 53 y.o. male with a history of HIV 1998, HCV, ETOH abuse and CHF due to NICM s/p Medtronic  CRT-D (06/2013). He also has a h/o syncope and previously had an EP study which was non-inducible for VT.    Was admitted 01/19/13- 01/22/13 for acute on chronic systolic HF. ECHO EF 20-25%. He was diuresed with IV lasix. He was discharged on coreg 6.25 mg BID, digoxin 0.25 mg daily, lisinopril 40 mg daily, and spironolactone 25 mg daily. Discharge weight 150 lbs. He stopped spironolactone due to severe headaches.  He was not able to tolerate titration of Coreg to 25 mg bid due to lightheadedness.   Admitted 8/27-8/31/18 for syncope from low BP resulting in trimalleolar fracture of the right ankle and left proximal tibia. He underwent ORIF of right trimalleolar fracture. HF meds and diuretics adjusted. He was discharged to SNF.   He presents today for regularly scheduled follow up. Seen several weeks ago with HTN. Hydral added. He feels better. He denies SOB, PND, orthopnea, or edema. No CP, HA, or focal neuro symptoms. He is asymptomatic, but remains anxious about his BP. States it still runs 140-150s systolic at home, but is great in clinic today. He uses a digital arm cuff. Taking all meds as directed.   Today he returns for HF follow up. Overall feeling fine. Denies SOB/PND/Orthopnea. Appetite ok. No fever or chills. He has not been weighing at home. Taking all medications but did not take medications this morning. Says SBp at home has been < 130 every day. Drinking 2-3 beers a day.   ICD : Activity ~ 4 hours a day. Fluid below index. No VT.   ECHO 04/2013: EF  20-25%, mod/severe MR, LA mod dilated ECHO 10/16/13 EF 20-25%  ECHO 03/14/15 EF 55% ECHO 12/17 EF 55% ECHO 8/18 EF 65-70%.   SH: [place] in [place], drinking a wine cooler twice a week (prior heavy ETOH).   Review of systems complete and found to be negative unless listed in HPI.    Family History  Problem Relation Age of Onset  . Lung cancer Mother   . Colon cancer Other 50   Past Medical History:  Diagnosis Date  . CHF (congestive heart failure) (HCC)   . Chronic systolic heart failure (HCC)    a. Echo 12/05/11:  EF 20-25%, diff HK with mild sparing of IL wall, mild AI, mod MR, mild LAE, mild RVE, mild reduced RVF.;  b.  Echo 05/11/2012:  Mild LVH, EF 20-25%, Gr 1 diast dysfn, mod MR, mild LAE  . Depression   . G6PD deficiency   . GERD (gastroesophageal reflux disease)   . Hepatitis    Hep B and Hep C (patient does not report this but these are listed in previous notes.)  . HIV infection (HCC)   . Hypertension   . NICM (nonischemic cardiomyopathy) (HCC)    cardia CTA 8/13 negative for obstructive CAD  . VT (ventricular tachycardia) (HCC)     Current Outpatient Medications  Medication Sig Dispense Refill  . amiodarone (PACERONE) 200 MG tablet TAKE 1 TABLET  BY MOUTH ONCE DAILY 90 tablet 1  . carvedilol (COREG) 25 MG tablet TAKE 1 TABLET BY MOUTH TWICE A DAY WITH MEALS 180 tablet 3  . colchicine 0.6 MG tablet Take 1 tablet (0.6 mg total) by mouth 2 (two) times daily as needed. For gout flares. 30 tablet 2  . diclofenac sodium (VOLTAREN) 1 % GEL Apply 2 g topically 4 (four) times daily. 100 g 11  . Elbasvir-Grazoprevir (ZEPATIER) 50-100 MG TABS Take 1 tablet by mouth daily. 28 tablet 2  . ENTRESTO 97-103 MG TAKE 1 TABLET BY MOUTH TWICE DAILY 60 tablet 3  . famotidine (PEPCID) 20 MG tablet TAKE 1 TABLET(20 MG) BY MOUTH TWICE DAILY 180 tablet 0  . furosemide (LASIX) 40 MG tablet Take 1 tablet (40 mg total) by mouth as needed. 30 tablet 5  . gabapentin (NEURONTIN) 300  MG capsule Take 300 mg by mouth every evening.    . hydrALAZINE (APRESOLINE) 50 MG tablet Take 1 tablet (50 mg total) by mouth 3 (three) times daily. Do not take if your systolic blood pressure is less that 120. 120 tablet 11  . hydrOXYzine (ATARAX/VISTARIL) 50 MG tablet take 1 tablet by mouth three times a day if needed 15 tablet 1  . mirtazapine (REMERON) 7.5 MG tablet Take 1 tablet (7.5 mg total) by mouth at bedtime. 30 tablet 5  . ODEFSEY 200-25-25 MG TABS tablet TAKE 1 TABLET BY MOUTH EVERY DAY WITH BREAKFAST 30 tablet 4  . ondansetron (ZOFRAN) 4 MG tablet Take 4 mg by mouth as needed.  0  . spironolactone (ALDACTONE) 25 MG tablet TAKE 1 TABLET(25 MG) BY MOUTH AT BEDTIME 30 tablet 3  . TIVICAY 50 MG tablet TAKE 1 TABLET BY MOUTH ONCE DAILY WITH LUNCH 30 tablet 4  . triamcinolone cream (KENALOG) 0.1 % Apply 1 application topically 3 (three) times daily.  0  . valACYclovir (VALTREX) 1000 MG tablet Take 1 tablet (1,000 mg total) by mouth 2 (two) times daily as needed (outbreaks). Take for 5 days for an outbreak 10 tablet 6  . clobetasol ointment (TEMOVATE) 0.05 % Apply 1 application topically 2 (two) times daily.  0   No current facility-administered medications for this encounter.     Vitals:   06/15/18 0939  BP: (!) 182/124  Pulse: 73  SpO2: 95%  Weight: 88.1 kg (194 lb 3.2 oz)   Wt Readings from Last 3 Encounters:  06/15/18 88.1 kg (194 lb 3.2 oz)  05/30/18 87.8 kg (193 lb 9.6 oz)  04/25/18 87.1 kg (192 lb)    Physical Exam:  General:  Well appearing. No resp difficulty HEENT: normal Neck: supple. no JVD. Carotids 2+ bilat; no bruits. No lymphadenopathy or thryomegaly appreciated. Cor: PMI nondisplaced. Regular rate & rhythm. No rubs, gallops or murmurs. Lungs: clear Abdomen: soft, nontender, nondistended. No hepatosplenomegaly. No bruits or masses. Good bowel sounds. Extremities: no cyanosis, clubbing, rash, edema Neuro: alert & orientedx3, cranial nerves grossly intact.  moves all 4 extremities w/o difficulty. Affect pleasant   ASSESSMENT & PLAN:  1) HTN Uncontrolled but he hasnt had his medications today. At home SBP has been stable so change for now.  - Continue Entresto 97/103 mg BID - Continue hydralazine 50 mg three time a day.    2) Chronic systolic HF; now with recovered EF: Nonischemic cardiomyopathy, ?due to ETOH abuse. EF 55% (03/2015) s/p Medtronic CRT-D (06/2013). 12/2016 ECHO EF 65-70%.  -NYHA II. Volume status stable.  - Continue Coreg 25 mg twice a day.   -  Continue spiro to 25 mg daily.  - Continue Entresto 97/103 mg BID. - Check ECHO in 6 months.     3) Ventricular tachycardia:  - Continue Amio 200 mg daily.  - No VT on interrogation.  - Discussed the need to yearly eye exams.   4) HIV - Stable.  - Followed by Dr Luciana Axe.   5) ETOH abuse - Discussed ETOH cessation.   Follow up 6 months with an ECHO. Check BMET today.    Tonye Becket, NP  06/15/2018 10:02 AM

## 2018-06-17 ENCOUNTER — Telehealth: Payer: Self-pay

## 2018-06-17 DIAGNOSIS — S82145D Nondisplaced bicondylar fracture of left tibia, subsequent encounter for closed fracture with routine healing: Secondary | ICD-10-CM | POA: Diagnosis not present

## 2018-06-17 NOTE — Telephone Encounter (Signed)
   [facility] Pre-operative Risk Assessment    Request for surgical clearance:  1. What type of surgery is being performed? Right ankle hardware removal for osteomyelitis   2. When is this surgery scheduled? 07/01/2018   3. What type of clearance is required (medical clearance vs. Pharmacy clearance to hold med vs. Both)? medical  4. Are there any medications that need to be held prior to surgery and how long?    5. Practice name and name of physician performing surgery? [facility]... Dr. Fredonia Highland   6. What is your office phone number Edie    7.   What is your office fax number [phone] att kelly  8.   Anesthesia type (None, local, MAC, general) ?

## 2018-06-22 NOTE — Telephone Encounter (Signed)
   Primary Cardiologist: [name], MD  Chart reviewed as part of pre-operative protocol coverage. Patient was seen by Tonye Becket, NP 06/15/2018 for routine follow-up.   Amy, can you comment on this patients preoperative risk status?  Please route you response back to the preop pool (P CV DIV PREOP). Thank you!  Beatriz Stallion, PA-C 06/22/2018, 12:54 PM

## 2018-06-22 NOTE — H&P (Signed)
[facility] [address]   [address] [place] [address] [phone] A Division of [facility]      RE: FRANCISCO, MINTO   [id number]   05-06-2066 PROGRESS NOTE: 06-17-18 Reason for visit: Right ankle pain. History of present illness: He is a 53 year-old gentleman who is established with Dr. Luciana Axe to treat HIV.  I did an ORIF of his bimalleolar ankle fracture in August of 2018.  This healed well.  Over the last several months he has had off and on pain and redness over the lateral aspect of the ankle.  No systemic symptoms.    EXAMINATION: Well appearing male in no apparent distress.  Erythematous and tenderness over his proximal incision of the lateral side.  Neurovascularly intact.   X-RAYS: X-rays show stable healing of his fracture.    ASSESSMENT/PLAN: I think he likely has a deep infection here.  I spoke with Dr. Luciana Axe.  Our plan to treat this is we are going to take him to the operating room, I am going to do a debridement and hardware removal, as well as cultures.  I am going to hold pre-op antibiotics.  He will then go home with p.o. antibiotics.  He will follow up with Dr. Luciana Axe a few days later to either adjust his p.o. or convert to IV antibiotics based on culture findings.    Jewel Baize.  Eulah Pont, M.D.  Electronically verified by Jewel Baize. Eulah Pont, M.D. TDM:jjh Cc: Dr. [name], fax: [phone] D 06-17-18 T 06-21-18

## 2018-06-23 ENCOUNTER — Other Ambulatory Visit: Payer: Self-pay

## 2018-06-23 ENCOUNTER — Ambulatory Visit: Payer: Medicare HMO | Admitting: Physical Therapy

## 2018-06-23 ENCOUNTER — Encounter: Payer: Self-pay | Admitting: Physical Therapy

## 2018-06-23 ENCOUNTER — Encounter (HOSPITAL_BASED_OUTPATIENT_CLINIC_OR_DEPARTMENT_OTHER): Payer: Self-pay | Admitting: *Deleted

## 2018-06-23 DIAGNOSIS — G8929 Other chronic pain: Secondary | ICD-10-CM | POA: Diagnosis not present

## 2018-06-23 DIAGNOSIS — M25512 Pain in left shoulder: Principal | ICD-10-CM

## 2018-06-23 DIAGNOSIS — M25612 Stiffness of left shoulder, not elsewhere classified: Secondary | ICD-10-CM | POA: Diagnosis not present

## 2018-06-23 DIAGNOSIS — M62838 Other muscle spasm: Secondary | ICD-10-CM

## 2018-06-23 NOTE — Progress Notes (Signed)
Reviewed pts chart with Dr. Hyacinth Meeker with anesthesiology, to confirm the need for Cardiac Clearance and also completion of currently ordered follow up ECHO before surgery at [facility]. Also will attempt to call pt and inform him of these needs as well. Will continue to follow.

## 2018-06-23 NOTE — Therapy (Addendum)
[place], [place], [address] Phone: [phone]   Fax:  [phone]  Physical Therapy Treatment/Discharge   Patient Details  Name: [name] MRN: [id number] Date of Birth: 07-08-1965 Referring Provider (PT): Dr Dorris Singh   Encounter Date: 06/23/2018  PT End of Session - 06/23/18 1021    Visit Number  4    Number of Visits  12    Date for PT Re-Evaluation  06/27/18    Authorization Type  [employer] Medicare     PT Start Time  7989    PT Stop Time  1053    PT Time Calculation (min)  38 min    Activity Tolerance  Patient tolerated treatment well    Behavior During Therapy  [facility] for tasks assessed/performed       Past Medical History:  Diagnosis Date  . CHF (congestive heart failure) ([place])   . Chronic systolic heart failure ([place])    a. Echo 12/05/11:  EF 20-25%, diff HK with mild sparing of IL wall, mild AI, mod MR, mild LAE, mild RVE, mild reduced RVF.;  b.  Echo 05/11/2012:  Mild LVH, EF 20-25%, Gr 1 diast dysfn, mod MR, mild LAE  . Depression   . G6PD deficiency   . GERD (gastroesophageal reflux disease)   . Hepatitis    Hep B and Hep C (patient does not report this but these are listed in previous notes.)  . HIV infection ([place])   . Hypertension   . NICM (nonischemic cardiomyopathy) ([place])    cardia CTA 8/13 negative for obstructive CAD  . Presence of permanent cardiac pacemaker   . VT (ventricular tachycardia) ([place])     Past Surgical History:  Procedure Laterality Date  . BI-VENTRICULAR PACEMAKER INSERTION N/A 06/26/2013   Procedure: BI-VENTRICULAR PACEMAKER INSERTION (CRT-P);  Surgeon: Evans Lance, MD;  Location: [facility] CATH LAB;  Service: Cardiovascular;  Laterality: N/A;  . COLONOSCOPY WITH PROPOFOL N/A 08/14/2016   Procedure: COLONOSCOPY WITH PROPOFOL;  Surgeon: Carol Ada, MD;  Location: [facility] ENDOSCOPY;  Service: Endoscopy;  Laterality: N/A;  . ELECTROPHYSIOLOGY STUDY N/A 02/19/2012    Procedure: ELECTROPHYSIOLOGY STUDY;  Surgeon: Evans Lance, MD;  Location: [facility] CATH LAB;  Service: Cardiovascular;  Laterality: N/A;  . ESOPHAGOGASTRODUODENOSCOPY  12/07/2011   Procedure: ESOPHAGOGASTRODUODENOSCOPY (EGD);  Surgeon: Ladene Artist, MD,FACG;  Location: [facility] ENDOSCOPY;  Service: Endoscopy;  Laterality: N/A;  . FINGER SURGERY     Thumb laceration.    Marland Kitchen LEFT HEART CATHETERIZATION WITH CORONARY ANGIOGRAM N/A 01/31/2014   Procedure: LEFT HEART CATHETERIZATION WITH CORONARY ANGIOGRAM;  Surgeon: Wellington Hampshire, MD;  Location: [facility] CATH LAB;  Service: Cardiovascular;  Laterality: N/A;  . ORIF ANKLE FRACTURE Right 01/26/2017   Procedure: OPEN REDUCTION INTERNAL FIXATION (ORIF) ANKLE FRACTURE;  Surgeon: Renette Butters, MD;  Location: [place];  Service: Orthopedics;  Laterality: Right;    There were no vitals filed for this visit.  Subjective Assessment - 06/23/18 2100    Subjective  Patient reports no pain in his shoulder at this time. He does report minor tightness in his upper trap. He feels comfortabel with his exercise porgram. He is ready for discharge.     Pertinent History  Heart failure; HIV; Pacemaker;     Limitations  House hold activities    Diagnostic tests  X-ray: negative for arthritis     Patient Stated Goals  to be able to use his arm     Currently in  Pain?  No/denies         [facility] PT Assessment - 06/23/18 0001      AROM   Left Shoulder Flexion  160 Degrees    Left Shoulder Internal Rotation  --   can reach to L4    Left Shoulder External Rotation  --   can reach to occiput without pain      Strength   Left Shoulder Flexion  5/5    Left Shoulder Internal Rotation  5/5    Left Shoulder External Rotation  5/5                   [facility] Adult PT Treatment/Exercise - 06/23/18 0001      Shoulder Exercises: Standing   Extension  20 reps    Theraband Level (Shoulder Extension)  Level 1 (Yellow)    Row  20 reps    Theraband Level (Shoulder Row)  Level 1  (Yellow)    Other Standing Exercises  bilateral ER       Shoulder Exercises: Pulleys   Flexion  2 minutes    ABduction  2 minutes      Manual Therapy   Manual Therapy  Passive ROM;Joint mobilization;Soft tissue mobilization    Joint Mobilization  inferior glides and PA mobilization;    Soft tissue mobilization  to upper traps and peri-scapular area; pec release with shoulder abduction     Passive ROM  emphasis on flexion and ER       Neck Exercises: Stretches   Upper Trapezius Stretch  3 reps;20 seconds    Levator Stretch  Right;Left;3 reps;20 seconds             PT Education - 06/23/18 2103    Education Details  reviewed final HEP     Person(s) Educated  Patient    Methods  Explanation;Demonstration;Tactile cues    Comprehension  Verbalized understanding;Returned demonstration;Verbal cues required;Tactile cues required;Need further instruction       PT Short Term Goals - 06/23/18 2111      PT SHORT TERM GOAL #1   Title  Patient will increase active shoulder flexion by 20 degrees     Baseline  full     Time  3    Period  Weeks    Status  Achieved      PT SHORT TERM GOAL #2   Title  Patient will report no radiating pain into the left arm     Baseline  no radiating pain     Time  3    Period  Weeks    Status  Achieved      PT SHORT TERM GOAL #3   Title  Patient will be independent with inital HEP     Baseline  perfroming at home     Time  3    Period  Weeks    Status  Achieved        PT Long Term Goals - 06/23/18 2112      PT LONG TERM GOAL #1   Title  Patient will reach to a shelf with a 2 pound weight without pain     Baseline  can lfit item to a shelf without a problem     Time  6    Period  Weeks    Status  Achieved      PT LONG TERM GOAL #2   Title  Patient will reach behind his back without pain in order to perfrom ADL's  Baseline  can reach to L5 without pain     Time  6    Period  Weeks    Status  Achieved      PT LONG TERM GOAL #3    Title  Patient will reach behind his head without pain in order to do his hair     Time  6    Period  Weeks    Status  Achieved            Plan - 06/23/18 2108    Clinical Impression Statement  Patient has made great progress. He has met his goals fdor therapy. He will be having ankle surgery next week. He wias encouraged to continue wokring on his shoulder as much as he can.     Clinical Presentation  Evolving    Clinical Decision Making  Moderate    Rehab Potential  Good    PT Frequency  2x / week    PT Duration  8 weeks    PT Treatment/Interventions  ADLs/Self Care Home Management;Cryotherapy;Electrical Stimulation;Iontophoresis 3m/ml Dexamethasone;Ultrasound;Moist Heat;Therapeutic activities;Therapeutic exercise;Neuromuscular re-education;Patient/family education;Passive range of motion;Taping    PT Next Visit Plan  soft tissue mobilization to upper trap/ periscapular area/ pec tendon area; be aware of his pacemaker; Gnetle mobilization of the shoulder; consider wand flexion; consdier pendulums but it may effect his neck; any kind of light activity he can tolerate.     PT Home Exercise Plan  work on posture     Consulted and Agree with Plan of Care  Patient       Patient will benefit from skilled therapeutic intervention in order to improve the following deficits and impairments:  Pain, Postural dysfunction, Decreased activity tolerance, Decreased endurance, Decreased range of motion, Decreased strength  Visit Diagnosis: Chronic left shoulder pain  Stiffness of left shoulder, not elsewhere classified  Other muscle spasm     Problem List Patient Active Problem List   Diagnosis Date Noted  . Screening examination for sexually transmitted disease 04/25/2018  . Need for prophylactic vaccination against Streptococcus pneumoniae (pneumococcus) 04/25/2018  . Liver fibrosis 10/26/2017  . Nonischemic cardiomyopathy ([place]   . Syncope 01/25/2017  . HIV (human  immunodeficiency virus infection) ([place] 01/25/2017  . Encounter for long-term (current) use of medications 03/21/2015  . Ventricular tachycardia, polymorphic ([place] 02/03/2014  . PTSD (post-traumatic stress disorder) 02/03/2014  . Sustained VT (ventricular tachycardia) ([place] 01/27/2014  . ICD (implantable cardioverter-defibrillator), biventricular, in situ 01/23/2014  . Paroxysmal ventricular fibrillation ([place] 01/19/2014  . Protein-calorie malnutrition, severe ([place] 02/13/2013  . GERD (gastroesophageal reflux disease) 08/19/2012  . Chronic systolic heart failure ([place] 01/25/2012  . Wide-complex tachycardia ([place] 01/07/2012  . Alcohol abuse 11/30/2011  . Marijuana abuse 11/30/2011  . Genital herpes 06/09/2006  . Chronic hepatitis C without hepatic coma ([place] 06/09/2006  . Primary hypertension 06/09/2006    DCarney LivingPT DPT  06/23/2018, 9:16 PM  [facility] [address]Phone: [phone]  Fax:  [phone] Name: TDERELL BRUUNMRN: [id number] of Birth: 128-Jul-1967

## 2018-06-24 ENCOUNTER — Encounter (HOSPITAL_BASED_OUTPATIENT_CLINIC_OR_DEPARTMENT_OTHER)
Admission: RE | Admit: 2018-06-24 | Discharge: 2018-06-24 | Disposition: A | Discharge status: Home / Self Care | Payer: Medicare HMO | Source: Ambulatory Visit | Attending: Orthopedic Surgery | Admitting: Orthopedic Surgery

## 2018-06-24 ENCOUNTER — Ambulatory Visit (INDEPENDENT_AMBULATORY_CARE_PROVIDER_SITE_OTHER): Payer: Medicare HMO | Admitting: Internal Medicine

## 2018-06-24 ENCOUNTER — Encounter: Payer: Self-pay | Admitting: Internal Medicine

## 2018-06-24 ENCOUNTER — Other Ambulatory Visit: Payer: Self-pay

## 2018-06-24 VITALS — BP 138/86 | HR 78 | Ht 64.5 in | Wt 209.8 lb

## 2018-06-24 DIAGNOSIS — Z01818 Encounter for other preprocedural examination: Secondary | ICD-10-CM | POA: Insufficient documentation

## 2018-06-24 DIAGNOSIS — I951 Orthostatic hypotension: Secondary | ICD-10-CM

## 2018-06-24 DIAGNOSIS — Z95810 Presence of automatic (implantable) cardiac defibrillator: Secondary | ICD-10-CM

## 2018-06-24 DIAGNOSIS — I5022 Chronic systolic (congestive) heart failure: Secondary | ICD-10-CM

## 2018-06-24 DIAGNOSIS — M25512 Pain in left shoulder: Secondary | ICD-10-CM | POA: Diagnosis not present

## 2018-06-24 DIAGNOSIS — G8929 Other chronic pain: Secondary | ICD-10-CM | POA: Diagnosis not present

## 2018-06-24 DIAGNOSIS — I472 Ventricular tachycardia, unspecified: Secondary | ICD-10-CM

## 2018-06-24 DIAGNOSIS — I428 Other cardiomyopathies: Secondary | ICD-10-CM

## 2018-06-24 DIAGNOSIS — M25612 Stiffness of left shoulder, not elsewhere classified: Secondary | ICD-10-CM | POA: Diagnosis not present

## 2018-06-24 DIAGNOSIS — M62838 Other muscle spasm: Secondary | ICD-10-CM | POA: Diagnosis not present

## 2018-06-24 LAB — CUP PACEART INCLINIC DEVICE CHECK
Date Time Interrogation Session: 20200124151220
Implantable Lead Implant Date: 20150126
Implantable Lead Implant Date: 20150126
Implantable Lead Implant Date: 20150126
Implantable Lead Location: 753858
Implantable Lead Location: 753859
Implantable Lead Location: 753860
Implantable Lead Model: 5076
MDC IDC PG IMPLANT DT: 20150126

## 2018-06-24 NOTE — Progress Notes (Signed)
Pt arrived for lab and EKG. Reviewed EKG with Dr Desmond Lope. Ok for surgery

## 2018-06-24 NOTE — Patient Instructions (Signed)

## 2018-06-24 NOTE — Progress Notes (Signed)
HPI [name] returns today for followup. He is pending ankle surgery. He has a long standing non-ischemic CM, VT, CHF, s/p ICD insertion. He has cirrhosis. He has been well controlled from an arrhythmia perspective on amiodarone. He has developed infection in his right ankle. He admits to dietary indiscretion and has gained 40 lbs. No sob or c/p. Allergies  Allergen Reactions  . Bactrim Rash     Current Outpatient Medications  Medication Sig Dispense Refill  . amiodarone (PACERONE) 200 MG tablet TAKE 1 TABLET BY MOUTH ONCE DAILY 90 tablet 1  . carvedilol (COREG) 25 MG tablet TAKE 1 TABLET BY MOUTH TWICE A DAY WITH MEALS 180 tablet 3  . clobetasol ointment (TEMOVATE) 0.05 % Apply 1 application topically 2 (two) times daily.  0  . colchicine 0.6 MG tablet Take 1 tablet (0.6 mg total) by mouth 2 (two) times daily as needed. For gout flares. 30 tablet 2  . diclofenac sodium (VOLTAREN) 1 % GEL Apply 2 g topically 4 (four) times daily. 100 g 11  . Elbasvir-Grazoprevir (ZEPATIER) 50-100 MG TABS Take 1 tablet by mouth daily. 28 tablet 2  . ENTRESTO 97-103 MG TAKE 1 TABLET BY MOUTH TWICE DAILY 60 tablet 3  . famotidine (PEPCID) 20 MG tablet TAKE 1 TABLET(20 MG) BY MOUTH TWICE DAILY 180 tablet 0  . gabapentin (NEURONTIN) 300 MG capsule Take 300 mg by mouth every evening.    . hydrALAZINE (APRESOLINE) 50 MG tablet Take 1 tablet (50 mg total) by mouth 3 (three) times daily. Do not take if your systolic blood pressure is less that 120. 120 tablet 11  . hydrOXYzine (ATARAX/VISTARIL) 50 MG tablet take 1 tablet by mouth three times a day if needed 15 tablet 1  . mirtazapine (REMERON) 7.5 MG tablet Take 1 tablet (7.5 mg total) by mouth at bedtime. 30 tablet 5  . ODEFSEY 200-25-25 MG TABS tablet TAKE 1 TABLET BY MOUTH EVERY DAY WITH BREAKFAST 30 tablet 4  . ondansetron (ZOFRAN) 4 MG tablet Take 4 mg by mouth as needed.  0  . spironolactone (ALDACTONE) 25 MG tablet TAKE 1 TABLET(25 MG) BY MOUTH AT  BEDTIME 30 tablet 3  . TIVICAY 50 MG tablet TAKE 1 TABLET BY MOUTH ONCE DAILY WITH LUNCH 30 tablet 4  . triamcinolone cream (KENALOG) 0.1 % Apply 1 application topically 3 (three) times daily.  0  . valACYclovir (VALTREX) 1000 MG tablet Take 1 tablet (1,000 mg total) by mouth 2 (two) times daily as needed (outbreaks). Take for 5 days for an outbreak 10 tablet 6  . furosemide (LASIX) 40 MG tablet Take 1 tablet (40 mg total) by mouth as needed. 30 tablet 5   No current facility-administered medications for this visit.      Past Medical History:  Diagnosis Date  . CHF (congestive heart failure) (H[facility])   . Chronic systolic heart failure ([facility])    a. Echo 12/05/11:  EF 20-25%, diff HK with mild sparing of IL wall, mild AI, mod MR, mild LAE, mild RVE, mild reduced RVF.;  b.  Echo 05/11/2012:  Mild LVH, EF 20-25%, Gr 1 diast dysfn, mod MR, mild LAE  . Depression   . G6PD deficiency   . GERD (gastroesophageal reflux disease)   . Hepatitis    Hep B and Hep C (patient does not report this but these are listed in previous notes.)  . HIV infection (H[facility])   . Hypertension   . NICM (nonischemic cardiomyopathy) (H[facility])  cardia CTA 8/13 negative for obstructive CAD  . Presence of permanent cardiac pacemaker   . VT (ventricular tachycardia) (H[facility])     ROS:   All systems reviewed and negative except as noted in the HPI.   Past Surgical History:  Procedure Laterality Date  . BI-VENTRICULAR PACEMAKER INSERTION N/A 06/26/2013   Procedure: BI-VENTRICULAR PACEMAKER INSERTION (CRT-P);  Surgeon: Marinus MawGregg W , MD;  [facility];  Service: Cardiovascular;  Laterality: N/A;  . COLONOSCOPY WITH PROPOFOL N/A 08/14/2016   Procedure: COLONOSCOPY WITH PROPOFOL;  Surgeon: Jeani HawkingPatrick Hung, MD;  Location: [facility] ENDOSCOPY;  Service: Endoscopy;  Laterality: N/A;  . ELECTROPHYSIOLOGY STUDY N/A 02/19/2012   Procedure: ELECTROPHYSIOLOGY STUDY;  Surgeon: Marinus MawGregg W , MD;  [facility];  Service:  Cardiovascular;  Laterality: N/A;  . ESOPHAGOGASTRODUODENOSCOPY  12/07/2011   Procedure: ESOPHAGOGASTRODUODENOSCOPY (EGD);  Surgeon: Meryl DareMalcolm T Stark, MD,FACG;  [facility];  Service: Endoscopy;  Laterality: N/A;  . FINGER SURGERY     Thumb laceration.    Marland Kitchen. LEFT HEART CATHETERIZATION WITH CORONARY ANGIOGRAM N/A 01/31/2014   Procedure: LEFT HEART CATHETERIZATION WITH CORONARY ANGIOGRAM;  Surgeon: Iran OuchMuhammad A Arida, MD;  Location: [facility] CATH LAB;  Service: Cardiovascular;  Laterality: N/A;  . ORIF ANKLE FRACTURE Right 01/26/2017   Procedure: OPEN REDUCTION INTERNAL FIXATION (ORIF) ANKLE FRACTURE;  Surgeon: Sheral ApleyMurphy, Bard D, MD;  Location: [facility] OR;  Service: Orthopedics;  Laterality: Right;     Family History  Problem Relation Age of Onset  . Lung cancer Mother   . Colon cancer Other 50     Social History   Socioeconomic History  . Marital status: Single    Spouse name: Not on file  . Number of children: Not on file  . Years of education: Not on file  . Highest education level: Not on file  Occupational History  . Occupation: Unemployed  Social Needs  . Financial resource strain: Not on file  . Food insecurity:    Worry: Not on file    Inability: Not on file  . Transportation needs:    Medical: Not on file    Non-medical: Not on file  Tobacco Use  . Smoking status: Never Smoker  . Smokeless tobacco: Never Used  Substance and Sexual Activity  . Alcohol use: Yes    Alcohol/week: 1.0 standard drinks    Types: 1 Cans of beer per week    Comment: occ beer  . Drug use: Yes    Frequency: 7.0 times per week    Types: Marijuana    Comment: marijuana last used jan 2018  . Sexual activity: Yes    Partners: Male    Comment: 35 year relationship, givencondoms  Lifestyle  . Physical activity:    Days per week: Not on file    Minutes per session: Not on file  . Stress: Not on file  Relationships  . Social connections:    Talks on phone: Not on file    Gets together: Not on  file    Attends religious service: Not on file    Active member of club or organization: Not on file    Attends meetings of clubs or organizations: Not on file    Relationship status: Not on file  . Intimate partner violence:    Fear of current or ex partner: Not on file    Emotionally abused: Not on file    Physically abused: Not on file    Forced sexual activity: Not on file  Other  Topics Concern  . Not on file  Social History Narrative   Lives alone.  Drinks beer daily.  Liquor rarely.  He occasionally smokes marijuana..     BP 138/86   Pulse 78   Ht 5' 4.5" (1.638 m)   Wt 209 lb 12.8 oz (95.2 kg)   SpO2 96%   BMI 35.46 kg/m   Physical Exam:  Well appearing NAD HEENT: Unremarkable Neck:  No JVD, no thyromegally Lymphatics:  No adenopathy Back:  No CVA tenderness Lungs:  Clear with no wheezes HEART:  Regular rate rhythm, no murmurs, no rubs, no clicks Abd:  soft, positive bowel sounds, no organomegally, no rebound, no guarding Ext:  2 plus pulses, no edema, no cyanosis, no clubbing Skin:  No rashes no nodules Neuro:  CN II through XII intact, motor grossly intact   DEVICE  Normal device function.  See PaceArt for details.   Assess/Plan: 1. VT - he has been quiet over the past year. He will continue low dose amiodarone. 2. Chronic systolic heart failure - his symptoms are class 2. He appears to be euvolemic. 3. ICD - his Medtronic biv ICD is working normally.  4. Preoperative evaluation - he is low risk for cardiac complications.   [name]

## 2018-06-27 LAB — BASIC METABOLIC PANEL
Anion gap: 11 (ref 5–15)
BUN: 14 mg/dL (ref 6–20)
CALCIUM: 10.1 mg/dL (ref 8.9–10.3)
CO2: 20 mmol/L — ABNORMAL LOW (ref 22–32)
CREATININE: 1.1 mg/dL (ref 0.61–1.24)
Chloride: 108 mmol/L (ref 98–111)
GFR calc Af Amer: >60 mL/min (ref >60)
GFR calc non Af Amer: >60 mL/min (ref >60)
Glucose, Bld: 101 mg/dL — ABNORMAL HIGH (ref 70–99)
Potassium: 4.5 mmol/L (ref 3.5–5.1)
Sodium: 139 mmol/L (ref 135–145)

## 2018-06-27 NOTE — Telephone Encounter (Signed)
Follow up   Per Tresa Endo at [facility] need to know if an echo needs to be done before clearance? Please call to discuss.

## 2018-06-27 NOTE — Progress Notes (Signed)
Dr. Desmond Lope notified of CO2- 20 and glucose 101- Dr. Desmond Lope ok for surgery.

## 2018-06-28 ENCOUNTER — Encounter: Payer: Medicare HMO | Admitting: Internal Medicine

## 2018-06-28 NOTE — Progress Notes (Signed)
Received note from Dr Ladona Ridgel stating that pt does not need/require another ECHO prior to surgery. Last ECHO 01-26-17 showed EF 65-70%. Chart reviewed with Dr Darlin Drop and states OK for [facility].

## 2018-06-28 NOTE — Telephone Encounter (Signed)
   Primary Cardiologist: Lewayne Bunting, MD ; [name], MD  Chart reviewed as part of pre-operative protocol coverage. Per Dr. Ladona Ridgel, given past medical history and time since last visit, based on ACC/AHA guidelines, [name] would be at acceptable risk for the planned procedure without further cardiovascular testing (including echocardiogram).   I will route this recommendation and Dr. Lubertha Basque clinic note from 06/24/2018 to the requesting party via Epic fax function and remove from pre-op pool.  Please call with questions.  Beatriz Stallion, PA-C 06/28/2018, 9:07 AM

## 2018-06-29 NOTE — Telephone Encounter (Signed)
Requested information faxed to [facility], from [facility] office, for surgery clearance. Last medical note, last BMET results, and last EKG sent.

## 2018-07-01 ENCOUNTER — Ambulatory Visit (HOSPITAL_BASED_OUTPATIENT_CLINIC_OR_DEPARTMENT_OTHER): Payer: Medicare HMO | Admitting: Anesthesiology

## 2018-07-01 ENCOUNTER — Ambulatory Visit (HOSPITAL_BASED_OUTPATIENT_CLINIC_OR_DEPARTMENT_OTHER)
Admission: RE | Admit: 2018-07-01 | Discharge: 2018-07-01 | Disposition: A | Discharge status: Home / Self Care | Payer: Medicare HMO | Attending: Orthopedic Surgery | Admitting: Orthopedic Surgery

## 2018-07-01 ENCOUNTER — Other Ambulatory Visit: Payer: Self-pay

## 2018-07-01 ENCOUNTER — Encounter (HOSPITAL_BASED_OUTPATIENT_CLINIC_OR_DEPARTMENT_OTHER)
Admission: RE | Disposition: A | Discharge status: Home / Self Care | Payer: Self-pay | Source: Home / Self Care | Attending: Orthopedic Surgery

## 2018-07-01 ENCOUNTER — Encounter (HOSPITAL_BASED_OUTPATIENT_CLINIC_OR_DEPARTMENT_OTHER): Payer: Self-pay

## 2018-07-01 DIAGNOSIS — I5022 Chronic systolic (congestive) heart failure: Secondary | ICD-10-CM | POA: Insufficient documentation

## 2018-07-01 DIAGNOSIS — B20 Human immunodeficiency virus [HIV] disease: Secondary | ICD-10-CM | POA: Diagnosis not present

## 2018-07-01 DIAGNOSIS — T8489XA Other specified complication of internal orthopedic prosthetic devices, implants and grafts, initial encounter: Secondary | ICD-10-CM | POA: Diagnosis not present

## 2018-07-01 DIAGNOSIS — Y832 Surgical operation with anastomosis, bypass or graft as the cause of abnormal reaction of the patient, or of later complication, without mention of misadventure at the time of the procedure: Secondary | ICD-10-CM | POA: Diagnosis not present

## 2018-07-01 DIAGNOSIS — R69 Illness, unspecified: Secondary | ICD-10-CM | POA: Diagnosis not present

## 2018-07-01 DIAGNOSIS — Z79899 Other long term (current) drug therapy: Secondary | ICD-10-CM | POA: Insufficient documentation

## 2018-07-01 DIAGNOSIS — K219 Gastro-esophageal reflux disease without esophagitis: Secondary | ICD-10-CM | POA: Insufficient documentation

## 2018-07-01 DIAGNOSIS — M869 Osteomyelitis, unspecified: Secondary | ICD-10-CM

## 2018-07-01 DIAGNOSIS — I110 Hypertensive heart disease with heart failure: Secondary | ICD-10-CM | POA: Diagnosis not present

## 2018-07-01 DIAGNOSIS — T84622A Infection and inflammatory reaction due to internal fixation device of right tibia, initial encounter: Secondary | ICD-10-CM | POA: Insufficient documentation

## 2018-07-01 DIAGNOSIS — M009 Pyogenic arthritis, unspecified: Secondary | ICD-10-CM | POA: Diagnosis present

## 2018-07-01 DIAGNOSIS — Z95810 Presence of automatic (implantable) cardiac defibrillator: Secondary | ICD-10-CM | POA: Diagnosis not present

## 2018-07-01 DIAGNOSIS — T84498A Other mechanical complication of other internal orthopedic devices, implants and grafts, initial encounter: Secondary | ICD-10-CM | POA: Diagnosis not present

## 2018-07-01 DIAGNOSIS — I472 Ventricular tachycardia: Secondary | ICD-10-CM | POA: Diagnosis not present

## 2018-07-01 DIAGNOSIS — T84624A Infection and inflammatory reaction due to internal fixation device of right fibula, initial encounter: Secondary | ICD-10-CM | POA: Diagnosis not present

## 2018-07-01 DIAGNOSIS — M86171 Other acute osteomyelitis, right ankle and foot: Secondary | ICD-10-CM | POA: Insufficient documentation

## 2018-07-01 HISTORY — PX: INCISION AND DRAINAGE: SHX5863

## 2018-07-01 HISTORY — PX: HARDWARE REMOVAL: SHX979

## 2018-07-01 HISTORY — DX: Presence of cardiac pacemaker: Z95.0

## 2018-07-01 SURGERY — REMOVAL, HARDWARE
Anesthesia: General | Site: Ankle | Laterality: Right

## 2018-07-01 MED ORDER — SCOPOLAMINE 1 MG/3DAYS TD PT72: 1.0000 | MEDICATED_PATCH | Freq: Once | TRANSDERMAL | Status: DC | PRN

## 2018-07-01 MED ORDER — FENTANYL CITRATE (PF) 100 MCG/2ML IJ SOLN
INTRAMUSCULAR | Status: DC | PRN
  Administered 2018-07-01 (×4): 25 ug via INTRAVENOUS

## 2018-07-01 MED ORDER — ONDANSETRON HCL 4 MG/2ML IJ SOLN
INTRAMUSCULAR | Status: DC | PRN
  Administered 2018-07-01: 4 mg via INTRAVENOUS

## 2018-07-01 MED ORDER — OXYCODONE HCL 5 MG PO TABS
ORAL_TABLET | ORAL | Status: AC
  Filled 2018-07-01: qty 1

## 2018-07-01 MED ORDER — BUPIVACAINE HCL (PF) 0.25 % IJ SOLN
INTRAMUSCULAR | Status: AC
  Filled 2018-07-01: qty 30

## 2018-07-01 MED ORDER — CEFAZOLIN SODIUM-DEXTROSE 2-4 GM/100ML-% IV SOLN
2.0000 g | INTRAVENOUS | Status: AC
  Administered 2018-07-01: 2 g via INTRAVENOUS

## 2018-07-01 MED ORDER — SODIUM CHLORIDE 0.9 % IR SOLN
Status: DC | PRN
  Administered 2018-07-01: 2000 mL

## 2018-07-01 MED ORDER — FENTANYL CITRATE (PF) 100 MCG/2ML IJ SOLN
INTRAMUSCULAR | Status: AC
  Filled 2018-07-01: qty 2

## 2018-07-01 MED ORDER — LIDOCAINE 2% (20 MG/ML) 5 ML SYRINGE
INTRAMUSCULAR | Status: DC | PRN
  Administered 2018-07-01: 80 mg via INTRAVENOUS

## 2018-07-01 MED ORDER — FENTANYL CITRATE (PF) 100 MCG/2ML IJ SOLN
25.0000 ug | INTRAMUSCULAR | Status: DC | PRN
  Administered 2018-07-01 (×2): 50 ug via INTRAVENOUS

## 2018-07-01 MED ORDER — BUPIVACAINE HCL (PF) 0.5 % IJ SOLN
INTRAMUSCULAR | Status: AC
  Filled 2018-07-01: qty 30

## 2018-07-01 MED ORDER — CEPHALEXIN 500 MG PO CAPS: 500.0000 mg | ORAL_CAPSULE | Freq: Four times a day (QID) | ORAL | 0 refills | Status: DC

## 2018-07-01 MED ORDER — LACTATED RINGERS IV SOLN: INTRAVENOUS | Status: DC

## 2018-07-01 MED ORDER — ACETAMINOPHEN 500 MG PO TABS
1000.0000 mg | ORAL_TABLET | Freq: Once | ORAL | Status: AC
  Administered 2018-07-01: 1000 mg via ORAL

## 2018-07-01 MED ORDER — LACTATED RINGERS IV SOLN
INTRAVENOUS | Status: DC
  Administered 2018-07-01 (×2): via INTRAVENOUS

## 2018-07-01 MED ORDER — PROPOFOL 10 MG/ML IV BOLUS
INTRAVENOUS | Status: DC | PRN
  Administered 2018-07-01: 160 mg via INTRAVENOUS

## 2018-07-01 MED ORDER — KETOROLAC TROMETHAMINE 30 MG/ML IJ SOLN
INTRAMUSCULAR | Status: DC | PRN
  Administered 2018-07-01: 30 mg via INTRAVENOUS

## 2018-07-01 MED ORDER — MIDAZOLAM HCL 5 MG/5ML IJ SOLN
INTRAMUSCULAR | Status: DC | PRN
  Administered 2018-07-01: 2 mg via INTRAVENOUS

## 2018-07-01 MED ORDER — DEXAMETHASONE SODIUM PHOSPHATE 10 MG/ML IJ SOLN
INTRAMUSCULAR | Status: AC
  Filled 2018-07-01: qty 1

## 2018-07-01 MED ORDER — CEFAZOLIN SODIUM-DEXTROSE 2-4 GM/100ML-% IV SOLN
INTRAVENOUS | Status: AC
  Filled 2018-07-01: qty 100

## 2018-07-01 MED ORDER — BUPIVACAINE HCL (PF) 0.5 % IJ SOLN
INTRAMUSCULAR | Status: DC | PRN
  Administered 2018-07-01: 10 mL

## 2018-07-01 MED ORDER — ACETAMINOPHEN 500 MG PO TABS
ORAL_TABLET | ORAL | Status: AC
  Filled 2018-07-01: qty 2

## 2018-07-01 MED ORDER — CHLORHEXIDINE GLUCONATE 4 % EX LIQD: 60.0000 mL | Freq: Once | CUTANEOUS | Status: DC

## 2018-07-01 MED ORDER — ONDANSETRON HCL 4 MG/2ML IJ SOLN
INTRAMUSCULAR | Status: AC
  Filled 2018-07-01: qty 2

## 2018-07-01 MED ORDER — FENTANYL CITRATE (PF) 100 MCG/2ML IJ SOLN: 50.0000 ug | INTRAMUSCULAR | Status: DC | PRN

## 2018-07-01 MED ORDER — ASPIRIN EC 81 MG PO TBEC: 81.0000 mg | DELAYED_RELEASE_TABLET | Freq: Every day | ORAL | 0 refills | Status: AC

## 2018-07-01 MED ORDER — KETOROLAC TROMETHAMINE 30 MG/ML IJ SOLN: 30.0000 mg | Freq: Once | INTRAMUSCULAR | Status: DC | PRN

## 2018-07-01 MED ORDER — LIDOCAINE 2% (20 MG/ML) 5 ML SYRINGE
INTRAMUSCULAR | Status: AC
  Filled 2018-07-01: qty 5

## 2018-07-01 MED ORDER — OXYCODONE HCL 5 MG PO TABS
5.0000 mg | ORAL_TABLET | Freq: Once | ORAL | Status: AC
  Administered 2018-07-01: 5 mg via ORAL

## 2018-07-01 MED ORDER — HYDROCODONE-ACETAMINOPHEN 5-325 MG PO TABS: 1.0000 | ORAL_TABLET | Freq: Three times a day (TID) | ORAL | 0 refills | Status: AC | PRN

## 2018-07-01 MED ORDER — KETOROLAC TROMETHAMINE 30 MG/ML IJ SOLN
INTRAMUSCULAR | Status: AC
  Filled 2018-07-01: qty 1

## 2018-07-01 MED ORDER — DOXYCYCLINE HYCLATE 50 MG PO CAPS: 100.0000 mg | ORAL_CAPSULE | Freq: Two times a day (BID) | ORAL | 0 refills | Status: AC

## 2018-07-01 MED ORDER — MIDAZOLAM HCL 2 MG/2ML IJ SOLN
INTRAMUSCULAR | Status: AC
  Filled 2018-07-01: qty 2

## 2018-07-01 MED ORDER — PROMETHAZINE HCL 25 MG/ML IJ SOLN: 6.2500 mg | INTRAMUSCULAR | Status: DC | PRN

## 2018-07-01 MED ORDER — DEXAMETHASONE SODIUM PHOSPHATE 10 MG/ML IJ SOLN
INTRAMUSCULAR | Status: DC | PRN
  Administered 2018-07-01: 4 mg via INTRAVENOUS

## 2018-07-01 MED ORDER — GABAPENTIN 300 MG PO CAPS: 300.0000 mg | ORAL_CAPSULE | Freq: Once | ORAL | Status: DC

## 2018-07-01 MED ORDER — MIDAZOLAM HCL 2 MG/2ML IJ SOLN: 1.0000 mg | INTRAMUSCULAR | Status: DC | PRN

## 2018-07-01 SURGICAL SUPPLY — 89 items
BANDAGE ACE 4X5 VEL STRL LF (GAUZE/BANDAGES/DRESSINGS) ×2 IMPLANT
BANDAGE ACE 6X5 VEL STRL LF (GAUZE/BANDAGES/DRESSINGS) IMPLANT
BLADE SURG 10 STRL SS (BLADE) ×2 IMPLANT
BLADE SURG 15 STRL LF DISP TIS (BLADE) ×2 IMPLANT
BLADE SURG 15 STRL SS (BLADE) ×8
BNDG CMPR 9X4 STRL LF SNTH (GAUZE/BANDAGES/DRESSINGS)
BNDG COHESIVE 4X5 TAN STRL (GAUZE/BANDAGES/DRESSINGS) ×2 IMPLANT
BNDG COHESIVE 6X5 TAN STRL LF (GAUZE/BANDAGES/DRESSINGS) IMPLANT
BNDG ESMARK 4X9 LF (GAUZE/BANDAGES/DRESSINGS) ×1 IMPLANT
BNDG GAUZE ELAST 4 BULKY (GAUZE/BANDAGES/DRESSINGS) IMPLANT
BOOT STEPPER DURA MED (SOFTGOODS) ×1 IMPLANT
CHLORAPREP W/TINT 26ML (MISCELLANEOUS) ×2 IMPLANT
CLSR STERI-STRIP ANTIMIC 1/2X4 (GAUZE/BANDAGES/DRESSINGS) ×1 IMPLANT
COVER BACK TABLE 60X90IN (DRAPES) ×2 IMPLANT
COVER MAYO STAND STRL (DRAPES) ×1 IMPLANT
COVER WAND RF STERILE (DRAPES) IMPLANT
CUFF TOURNIQUET SINGLE 24IN (TOURNIQUET CUFF) IMPLANT
CUFF TOURNIQUET SINGLE 34IN LL (TOURNIQUET CUFF) ×1 IMPLANT
DECANTER SPIKE VIAL GLASS SM (MISCELLANEOUS) IMPLANT
DRAPE EXTREMITY T 121X128X90 (DISPOSABLE) ×2 IMPLANT
DRAPE IMP U-DRAPE 54X76 (DRAPES) ×2 IMPLANT
DRAPE INCISE IOBAN 66X45 STRL (DRAPES) ×2 IMPLANT
DRAPE OEC MINIVIEW 54X84 (DRAPES) ×2 IMPLANT
DRAPE SURG 17X23 STRL (DRAPES) IMPLANT
DRAPE U-SHAPE 47X51 STRL (DRAPES) ×2 IMPLANT
DRSG ADAPTIC 3X8 NADH LF (GAUZE/BANDAGES/DRESSINGS) ×1 IMPLANT
DRSG EMULSION OIL 3X3 NADH (GAUZE/BANDAGES/DRESSINGS) ×2 IMPLANT
DRSG MEPILEX BORDER 4X8 (GAUZE/BANDAGES/DRESSINGS) IMPLANT
DRSG PAD ABDOMINAL 8X10 ST (GAUZE/BANDAGES/DRESSINGS) ×1 IMPLANT
ELECT REM PT RETURN 9FT ADLT (ELECTROSURGICAL) ×2
ELECTRODE REM PT RTRN 9FT ADLT (ELECTROSURGICAL) ×1 IMPLANT
GAUZE SPONGE 4X4 12PLY STRL (GAUZE/BANDAGES/DRESSINGS) ×2 IMPLANT
GAUZE SPONGE 4X4 12PLY STRL LF (GAUZE/BANDAGES/DRESSINGS) ×2 IMPLANT
GAUZE XEROFORM 1X8 LF (GAUZE/BANDAGES/DRESSINGS) IMPLANT
GLOVE BIO SURGEON STRL SZ7.5 (GLOVE) ×4 IMPLANT
GLOVE BIOGEL PI IND STRL 7.0 (GLOVE) IMPLANT
GLOVE BIOGEL PI IND STRL 8 (GLOVE) ×2 IMPLANT
GLOVE BIOGEL PI INDICATOR 7.0 (GLOVE) ×1
GLOVE BIOGEL PI INDICATOR 8 (GLOVE) ×4
GLOVE SURG SYN 8.0 (GLOVE) ×2 IMPLANT
GLOVE SURG SYN 8.0 PF PI (GLOVE) IMPLANT
GOWN STRL REIN XL XLG (GOWN DISPOSABLE) ×1 IMPLANT
GOWN STRL REUS W/ TWL LRG LVL3 (GOWN DISPOSABLE) ×3 IMPLANT
GOWN STRL REUS W/ TWL XL LVL3 (GOWN DISPOSABLE) ×1 IMPLANT
GOWN STRL REUS W/TWL LRG LVL3 (GOWN DISPOSABLE) ×4
GOWN STRL REUS W/TWL XL LVL3 (GOWN DISPOSABLE) ×2
MANIFOLD NEPTUNE II (INSTRUMENTS) ×1 IMPLANT
NDL HYPO 25X1 1.5 SAFETY (NEEDLE) ×1 IMPLANT
NEEDLE HYPO 25X1 1.5 SAFETY (NEEDLE) ×2 IMPLANT
NS IRRIG 1000ML POUR BTL (IV SOLUTION) ×2 IMPLANT
PACK BASIN DAY SURGERY FS (CUSTOM PROCEDURE TRAY) ×2 IMPLANT
PAD ARMBOARD 7.5X6 YLW CONV (MISCELLANEOUS) ×4 IMPLANT
PAD CAST 3X4 CTTN HI CHSV (CAST SUPPLIES) IMPLANT
PAD CAST 4YDX4 CTTN HI CHSV (CAST SUPPLIES) ×1 IMPLANT
PADDING CAST ABS 4INX4YD NS (CAST SUPPLIES) ×1
PADDING CAST ABS COTTON 4X4 ST (CAST SUPPLIES) ×1 IMPLANT
PADDING CAST COTTON 3X4 STRL (CAST SUPPLIES)
PADDING CAST COTTON 4X4 STRL (CAST SUPPLIES) ×2
PENCIL BUTTON HOLSTER BLD 10FT (ELECTRODE) ×2 IMPLANT
SET IRRIG Y TYPE TUR BLADDER L (SET/KITS/TRAYS/PACK) ×2 IMPLANT
SHEET MEDIUM DRAPE 40X70 STRL (DRAPES) ×1 IMPLANT
SLEEVE SCD COMPRESS KNEE MED (MISCELLANEOUS) IMPLANT
SPONGE LAP 18X18 RF (DISPOSABLE) ×2 IMPLANT
SPONGE LAP 4X18 RFD (DISPOSABLE) ×3 IMPLANT
STOCKINETTE 4X48 STRL (DRAPES) IMPLANT
STOCKINETTE 6  STRL (DRAPES) ×1
STOCKINETTE 6 STRL (DRAPES) ×1 IMPLANT
STOCKINETTE IMPERVIOUS LG (DRAPES) ×1 IMPLANT
SUCTION FRAZIER HANDLE 10FR (MISCELLANEOUS)
SUCTION TUBE FRAZIER 10FR DISP (MISCELLANEOUS) IMPLANT
SUT ETHILON 3 0 PS 1 (SUTURE) ×1 IMPLANT
SUT MNCRL AB 4-0 PS2 18 (SUTURE) IMPLANT
SUT MON AB 2-0 CT1 36 (SUTURE) IMPLANT
SUT MON AB 4-0 PC3 18 (SUTURE) IMPLANT
SUT PROLENE 3 0 PS 2 (SUTURE) IMPLANT
SUT VIC AB 0 CT1 27 (SUTURE)
SUT VIC AB 0 CT1 27XBRD ANBCTR (SUTURE) IMPLANT
SUT VIC AB 2-0 CT1 27 (SUTURE) ×2
SUT VIC AB 2-0 CT1 TAPERPNT 27 (SUTURE) IMPLANT
SUT VIC AB 2-0 SH 27 (SUTURE)
SUT VIC AB 2-0 SH 27XBRD (SUTURE) IMPLANT
SUT VIC AB 3-0 FS2 27 (SUTURE) IMPLANT
SYR BULB 3OZ (MISCELLANEOUS) ×2 IMPLANT
SYR CONTROL 10ML LL (SYRINGE) IMPLANT
TOWEL GREEN STERILE FF (TOWEL DISPOSABLE) ×2 IMPLANT
TRAY DSU PREP LF (CUSTOM PROCEDURE TRAY) ×2 IMPLANT
TUBE CONNECTING 20X1/4 (TUBING) ×2 IMPLANT
UNDERPAD 30X30 (UNDERPADS AND DIAPERS) ×2 IMPLANT
YANKAUER SUCT BULB TIP NO VENT (SUCTIONS) ×2 IMPLANT

## 2018-07-01 NOTE — Discharge Instructions (Signed)
Elevate leg to reduce pain / swelling.  Diet: As you were doing prior to hospitalization   Dressing:  Keep dressings on and dry until follow up.  Activity:  Increase activity slowly as tolerated, but follow the weight bearing instructions below.  The rules on driving is that you can not be taking narcotics while you drive, and you must feel in control of the vehicle.    Weight Bearing:   Wear boot until follow up.    To prevent constipation: you may use a stool softener such as -  Colace (over the counter) 100 mg by mouth twice a day  Drink plenty of fluids (prune juice may be helpful) and high fiber foods Miralax (over the counter) for constipation as needed.    Itching:  If you experience itching with your medications, try taking only a single pain pill, or even half a pain pill at a time.  You can also use benadryl over the counter for itching or also to help with sleep.   Precautions:  If you experience chest pain or shortness of breath - call 911 immediately for transfer to the hospital emergency department!!  If you develop a fever greater that 101 F, purulent drainage from wound, increased redness or drainage from wound, or calf pain -- Call the office at [phone]                                                 Follow- Up Appointment:  Please call for an appointment to be seen in 1-2 weeks [place] - [phone]     Post Anesthesia Home Care Instructions  NO TYLENOL UNTIL AFTER 515PM 07/01/2018.  NO IBUPROFEN UNTIL AFTER 830PM 07/01/2018.  Activity: Get plenty of rest for the remainder of the day. A responsible individual must stay with you for 24 hours following the procedure.  For the next 24 hours, DO NOT: -Drive a car -Advertising copywriter -Drink alcoholic beverages -Take any medication unless instructed by your physician -Make any legal decisions or sign important papers.  Meals: Start with liquid foods such as gelatin or soup. Progress to regular foods as  tolerated. Avoid greasy, spicy, heavy foods. If nausea and/or vomiting occur, drink only clear liquids until the nausea and/or vomiting subsides. Call your physician if vomiting continues.  Special Instructions/Symptoms: Your throat may feel dry or sore from the anesthesia or the breathing tube placed in your throat during surgery. If this causes discomfort, gargle with warm salt water. The discomfort should disappear within 24 hours.  If you had a scopolamine patch placed behind your ear for the management of post- operative nausea and/or vomiting:  1. The medication in the patch is effective for 72 hours, after which it should be removed.  Wrap patch in a tissue and discard in the trash. Wash hands thoroughly with soap and water. 2. You may remove the patch earlier than 72 hours if you experience unpleasant side effects which may include dry mouth, dizziness or visual disturbances. 3. Avoid touching the patch. Wash your hands with soap and water after contact with the patch.     Post Anesthesia Home Care Instructions  Activity: Get plenty of rest for the remainder of the day. A responsible individual must stay with you for 24 hours following the procedure.  For the next 24 hours, DO NOT: -Drive a car -  Operate machinery -Drink alcoholic beverages -Take any medication unless instructed by your physician -Make any legal decisions or sign important papers.  Meals: Start with liquid foods such as gelatin or soup. Progress to regular foods as tolerated. Avoid greasy, spicy, heavy foods. If nausea and/or vomiting occur, drink only clear liquids until the nausea and/or vomiting subsides. Call your physician if vomiting continues.  Special Instructions/Symptoms: Your throat may feel dry or sore from the anesthesia or the breathing tube placed in your throat during surgery. If this causes discomfort, gargle with warm salt water. The discomfort should disappear within 24 hours.  If you had a  scopolamine patch placed behind your ear for the management of post- operative nausea and/or vomiting:  1. The medication in the patch is effective for 72 hours, after which it should be removed.  Wrap patch in a tissue and discard in the trash. Wash hands thoroughly with soap and water. 2. You may remove the patch earlier than 72 hours if you experience unpleasant side effects which may include dry mouth, dizziness or visual disturbances. 3. Avoid touching the patch. Wash your hands with soap and water after contact with the patch.

## 2018-07-01 NOTE — Transfer of Care (Signed)
Immediate Anesthesia Transfer of Care Note  Patient: [name]  Procedure(s) Performed: HARDWARE REMOVAL (Right Ankle) INCISION AND DRAINAGE (Right Ankle)  Patient Location: PACU  Anesthesia Type:General  Level of Consciousness: drowsy and patient cooperative  Airway & Oxygen Therapy: Patient Spontanous Breathing and Patient connected to face mask oxygen  Post-op Assessment: Report given to RN and Post -op Vital signs reviewed and stable  Post vital signs: Reviewed and stable  Last Vitals:  Vitals Value Taken Time  BP 130/95   Temp    Pulse 68 07/01/2018  2:33 PM  Resp 11 07/01/2018  2:33 PM  SpO2 95 % 07/01/2018  2:33 PM  Vitals shown include unvalidated device data.  Last Pain:  Vitals:   07/01/18 1058  TempSrc: Oral  PainSc: 0-No pain         Complications: No apparent anesthesia complications

## 2018-07-01 NOTE — Anesthesia Preprocedure Evaluation (Signed)
Anesthesia Evaluation  Patient identified by MRN, date of birth, ID band Patient awake    Reviewed: Allergy & Precautions, NPO status , Patient's Chart, lab work & pertinent test results  Airway Mallampati: II  TM Distance: >3 FB Neck ROM: Full    Dental no notable dental hx.    Pulmonary neg pulmonary ROS,    Pulmonary exam normal breath sounds clear to auscultation       Cardiovascular hypertension, Pt. on medications +CHF  Normal cardiovascular exam+ pacemaker + Cardiac Defibrillator  Rhythm:Regular Rate:Normal  EF 25% in 2013   Neuro/Psych negative neurological ROS  negative psych ROS   GI/Hepatic negative GI ROS, (+)     substance abuse  alcohol use, Hepatitis -, B, C  Endo/Other  negative endocrine ROS  Renal/GU negative Renal ROS  negative genitourinary   Musculoskeletal negative musculoskeletal ROS (+)   Abdominal   Peds negative pediatric ROS (+)  Hematology  (+) HIV,   Anesthesia Other Findings   Reproductive/Obstetrics negative OB ROS                             Anesthesia Physical Anesthesia Plan  ASA: IV  Anesthesia Plan: General   Post-op Pain Management:    Induction: Intravenous  PONV Risk Score and Plan: 2 and Ondansetron, Treatment may vary due to age or medical condition and Dexamethasone  Airway Management Planned: LMA  Additional Equipment:   Intra-op Plan:   Post-operative Plan: Extubation in OR  Informed Consent: I have reviewed the patients History and Physical, chart, labs and discussed the procedure including the risks, benefits and alternatives for the proposed anesthesia with the patient or authorized representative who has indicated his/her understanding and acceptance.     Dental advisory given  Plan Discussed with: CRNA and Surgeon  Anesthesia Plan Comments:         Anesthesia Quick Evaluation

## 2018-07-01 NOTE — Anesthesia Postprocedure Evaluation (Signed)
Anesthesia Post Note  Patient: [name]  Procedure(s) Performed: HARDWARE REMOVAL (Right Ankle) INCISION AND DRAINAGE (Right Ankle)     Patient location during evaluation: PACU Anesthesia Type: General Level of consciousness: awake and alert Pain management: pain level controlled Vital Signs Assessment: post-procedure vital signs reviewed and stable Respiratory status: spontaneous breathing, nonlabored ventilation, respiratory function stable and patient connected to nasal cannula oxygen Cardiovascular status: blood pressure returned to baseline and stable Postop Assessment: no apparent nausea or vomiting Anesthetic complications: no    Last Vitals:  Vitals:   07/01/18 1058 07/01/18 1432  BP: (!) 132/102 (!) 130/95  Pulse: 76 66  Resp: 18 11  Temp: 36.8 C 36.6 C  SpO2: 97% 95%    Last Pain:  Vitals:   07/01/18 1432  TempSrc:   PainSc: Asleep                 Lilah Mijangos S

## 2018-07-01 NOTE — Anesthesia Procedure Notes (Signed)
Procedure Name: LMA Insertion Date/Time: 07/01/2018 1:34 PM Performed by: Pearson Grippe, CRNA Pre-anesthesia Checklist: Patient identified, Emergency Drugs available, Suction available and Patient being monitored Patient Re-evaluated:Patient Re-evaluated prior to induction Oxygen Delivery Method: Circle system utilized Preoxygenation: Pre-oxygenation with 100% oxygen Induction Type: IV induction Ventilation: Mask ventilation without difficulty LMA: LMA inserted LMA Size: 4.0 Number of attempts: 1 Airway Equipment and Method: Bite block Placement Confirmation: positive ETCO2 Tube secured with: Tape Dental Injury: Teeth and Oropharynx as per pre-operative assessment

## 2018-07-01 NOTE — Interval H&P Note (Signed)
I participated in the care of this patient and agree with the above history, physical and evaluation. I performed a review of the history and a physical exam as detailed   Cain Daniel Murphy MD  

## 2018-07-03 NOTE — Op Note (Signed)
07/01/2018  3:05 PM  PATIENT:  [name]    PRE-OPERATIVE DIAGNOSIS:  ACUTE OSTEOMYELITIS RIGHT ANKLE AND FOOT,COMPLICATIONS OF OTHER ORTHOPAEDIC DEVICES , IMPLANTS AND GRAFTS  POST-OPERATIVE DIAGNOSIS:  Same  PROCEDURE:  HARDWARE REMOVAL, INCISION AND DRAINAGE  SURGEON:  Sheral Apley, MD  ASSISTANT: Aquilla Hacker, PA-C, he was present and scrubbed throughout the case, critical for completion in a timely fashion, and for retraction, instrumentation, and closure.   ANESTHESIA:   gen  PREOPERATIVE INDICATIONS:  [name] is a  53 y.o. male with a diagnosis of ACUTE OSTEOMYELITIS RIGHT ANKLE AND FOOT,COMPLICATIONS OF OTHER ORTHOPAEDIC DEVICES , IMPLANTS AND GRAFTS who failed conservative measures and elected for surgical management.    The risks benefits and alternatives were discussed with the patient preoperatively including but not limited to the risks of infection, bleeding, nerve injury, cardiopulmonary complications, the need for revision surgery, among others, and the patient was willing to proceed.  OPERATIVE IMPLANTS: none  OPERATIVE FINDINGS: removed all hardware  BLOOD LOSS: min  COMPLICATIONS: none  TOURNIQUET TIME: 30  OPERATIVE PROCEDURE:  Patient was identified in the preoperative holding area and site was marked by me He was transported to the operating theater and placed on the table in supine position taking care to pad all bony prominences. After a preincinduction time out anesthesia was induced. The right lower extremity was prepped and draped in normal sterile fashion and a pre-incision timeout was performed. He received ancef was held until cultures were obtained for preoperative antibiotics.   I made an incision through his lateral ankle incision and dissected sharply down to his plate.  I obtained tissues for culture from around the plate where he had persistent erythema.  These were sent and I then gave Ancef for antibiotics.  I then  removed the lateral ankle plate and screws without complication.  I debrided any devitalized tissue with a rondure.  This was then thoroughly irrigated and skin was closed with a nylon stitch.  I then turned my attention medially where I made a small incision over his medial malleolus spreading dissection was used to protect the saphenous vein.  Identified the cannulated screws and removed both of these hole.  I thoroughly irrigated both incisions and closed with nylon stitch sterile dressings were applied to both he was placed in a boot awoken and taken the PACU in stable condition post operative plan  POST OPERATIVE PLAN: Weightbearing as tolerated mobilization and aspirin for DVT prophylaxis.  We will continue continue Keflex and doxycycline until cultures are returned.

## 2018-07-04 ENCOUNTER — Encounter (HOSPITAL_BASED_OUTPATIENT_CLINIC_OR_DEPARTMENT_OTHER): Payer: Self-pay | Admitting: Orthopedic Surgery

## 2018-07-05 ENCOUNTER — Ambulatory Visit (INDEPENDENT_AMBULATORY_CARE_PROVIDER_SITE_OTHER): Payer: Medicare HMO | Admitting: Internal Medicine

## 2018-07-05 ENCOUNTER — Encounter: Payer: Self-pay | Admitting: Internal Medicine

## 2018-07-05 DIAGNOSIS — K7400 Hepatic fibrosis, unspecified: Secondary | ICD-10-CM

## 2018-07-05 DIAGNOSIS — Z21 Asymptomatic human immunodeficiency virus [HIV] infection status: Secondary | ICD-10-CM

## 2018-07-05 DIAGNOSIS — M86171 Other acute osteomyelitis, right ankle and foot: Secondary | ICD-10-CM | POA: Diagnosis not present

## 2018-07-05 DIAGNOSIS — R69 Illness, unspecified: Secondary | ICD-10-CM | POA: Diagnosis not present

## 2018-07-05 MED ORDER — CEPHALEXIN 500 MG PO CAPS: 500.0000 mg | ORAL_CAPSULE | Freq: Four times a day (QID) | ORAL | 0 refills | Status: AC

## 2018-07-05 MED ORDER — DOXYCYCLINE HYCLATE 100 MG PO TABS: 100.0000 mg | ORAL_TABLET | Freq: Two times a day (BID) | ORAL | 0 refills | Status: DC

## 2018-07-05 NOTE — Assessment & Plan Note (Signed)
Recent screen on CT without concerns for [facility] Will get ultrasound every 6 months, due in May again.

## 2018-07-05 NOTE — Assessment & Plan Note (Signed)
Doing well on his current regimen 

## 2018-07-05 NOTE — Assessment & Plan Note (Addendum)
New problem.  Hardware removal.  Chronic.  No positive growth on culture.  I will have him continue with oral doxycycline and Keflex.  No indication for IV therapy at this time.   He is to call Dr. Greig Right office to assure he has follow up.

## 2018-07-05 NOTE — Progress Notes (Signed)
   Subjective:    Patient ID: [name], male    DOB: April 07, 1966, 53 y.o.   MRN: [id number]  HPI He is here for a new problem of osteomyelitis. Has has HIV and well controlled and in August 2018 underwent ORIF for bimalleolar ankle fracture.  He had done well but recently with more pain and some redness so taken to the OR by Dr. Wandra Feinstein on 1/31 and had hardware removed.  No pus noted on op report.  Debrided.  He is here today with some pain but mainly discomfort of his right thigh from the weight of the boot.  No associated rash or diarrhea. Taking doxycycline and Keflex.    Review of Systems  Constitutional: Negative for fatigue.  Skin: Negative for rash.       Objective:   Physical Exam Constitutional:      Appearance: Normal appearance.  HENT:     Mouth/Throat:     Pharynx: No posterior oropharyngeal erythema.  Cardiovascular:     Rate and Rhythm: Normal rate and regular rhythm.     Heart sounds: No murmur.  Pulmonary:     Effort: Pulmonary effort is normal.     Breath sounds: Normal breath sounds.  Skin:    Findings: No rash.  Neurological:     Mental Status: He is alert.   SH: + occasional alcohol        Assessment & Plan:

## 2018-07-06 LAB — AEROBIC/ANAEROBIC CULTURE (SURGICAL/DEEP WOUND): CULTURE: NO GROWTH

## 2018-07-06 LAB — AEROBIC/ANAEROBIC CULTURE W GRAM STAIN (SURGICAL/DEEP WOUND)

## 2018-07-11 DIAGNOSIS — M86171 Other acute osteomyelitis, right ankle and foot: Secondary | ICD-10-CM | POA: Diagnosis not present

## 2018-07-19 ENCOUNTER — Ambulatory Visit (INDEPENDENT_AMBULATORY_CARE_PROVIDER_SITE_OTHER): Payer: Medicare HMO

## 2018-07-19 DIAGNOSIS — I5022 Chronic systolic (congestive) heart failure: Secondary | ICD-10-CM | POA: Diagnosis not present

## 2018-07-19 DIAGNOSIS — I428 Other cardiomyopathies: Secondary | ICD-10-CM

## 2018-07-20 LAB — CUP PACEART REMOTE DEVICE CHECK
Battery Remaining Longevity: 25 mo
Battery Voltage: 2.93 V
Brady Statistic AP VP Percent: 0.03 %
Brady Statistic AS VP Percent: 98.62 %
Brady Statistic AS VS Percent: 1.33 %
Brady Statistic RA Percent Paced: 0.04 %
Brady Statistic RV Percent Paced: 2.14 %
Date Time Interrogation Session: 20200218191526
HighPow Impedance: 73 Ohm
Implantable Lead Implant Date: 20150126
Implantable Lead Implant Date: 20150126
Implantable Lead Implant Date: 20150126
Implantable Lead Location: 753858
Implantable Lead Location: 753859
Implantable Lead Model: 4396
Implantable Lead Model: 5076
Implantable Lead Model: 6935
Implantable Pulse Generator Implant Date: 20150126
Lead Channel Impedance Value: 399 Ohm
Lead Channel Impedance Value: 399 Ohm
Lead Channel Impedance Value: 475 Ohm
Lead Channel Impedance Value: 475 Ohm
Lead Channel Impedance Value: 532 Ohm
Lead Channel Impedance Value: 817 Ohm
Lead Channel Pacing Threshold Amplitude: 0.625 V
Lead Channel Pacing Threshold Amplitude: 0.625 V
Lead Channel Pacing Threshold Amplitude: 0.875 V
Lead Channel Pacing Threshold Pulse Width: 0.4 ms
Lead Channel Pacing Threshold Pulse Width: 0.4 ms
Lead Channel Sensing Intrinsic Amplitude: 1.25 mV
Lead Channel Sensing Intrinsic Amplitude: 1.25 mV
Lead Channel Sensing Intrinsic Amplitude: 12.125 mV
Lead Channel Sensing Intrinsic Amplitude: 12.125 mV
Lead Channel Setting Pacing Amplitude: 2 V
Lead Channel Setting Pacing Amplitude: 2 V
Lead Channel Setting Pacing Pulse Width: 0.4 ms
Lead Channel Setting Pacing Pulse Width: 0.4 ms
Lead Channel Setting Sensing Sensitivity: 0.3 mV
MDC IDC LEAD LOCATION: 753860
MDC IDC MSMT LEADCHNL LV PACING THRESHOLD PULSEWIDTH: 0.4 ms
MDC IDC SET LEADCHNL RV PACING AMPLITUDE: 2.5 V
MDC IDC STAT BRADY AP VS PERCENT: 0.01 %

## 2018-07-26 ENCOUNTER — Ambulatory Visit (INDEPENDENT_AMBULATORY_CARE_PROVIDER_SITE_OTHER): Payer: Medicare HMO | Admitting: Internal Medicine

## 2018-07-26 ENCOUNTER — Encounter: Payer: Self-pay | Admitting: Internal Medicine

## 2018-07-26 DIAGNOSIS — Z21 Asymptomatic human immunodeficiency virus [HIV] infection status: Secondary | ICD-10-CM

## 2018-07-26 DIAGNOSIS — R69 Illness, unspecified: Secondary | ICD-10-CM | POA: Diagnosis not present

## 2018-07-26 DIAGNOSIS — M86171 Other acute osteomyelitis, right ankle and foot: Secondary | ICD-10-CM | POA: Diagnosis not present

## 2018-07-26 IMAGING — CT CT KNEE*L* W/O CM
3 series · 12 of 33 positions shown, 14 images · non-contrast
Comparison: Radiographs dated 01/25/2017

CLINICAL DATA: Proximal tibia fracture secondary to a fall
yesterday.

EXAM:
CT OF THE LEFT KNEE WITHOUT CONTRAST
TECHNIQUE: Multidetector CT imaging of the left knee was performed according to
the standard protocol. Multiplanar CT image reconstructions were
also generated.

[Series 4: lower ext 1.5 st · axial · 0.34mm/px · z∈[-334,-152]mm · 4 of 175 slices shown, 5 images]
[im 27/175  soft-tissue]
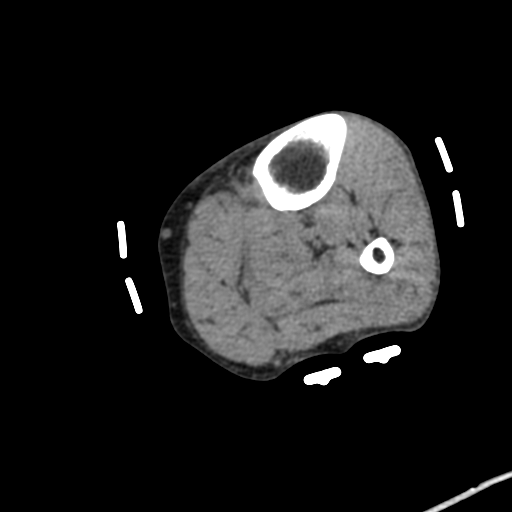
[im 27/175  bone]
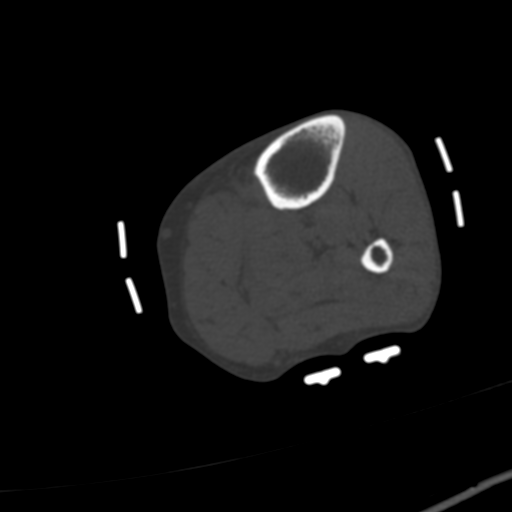
[im 67/175  bone]
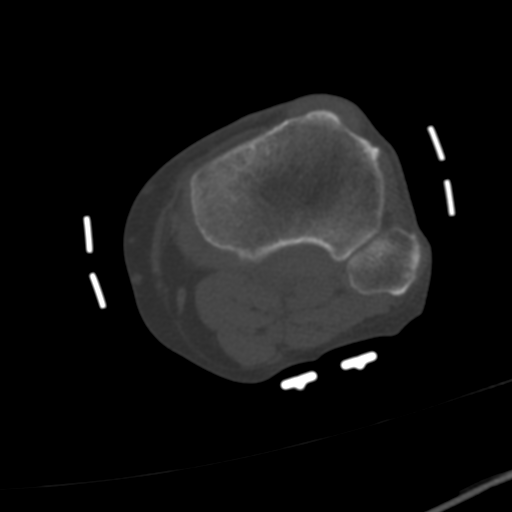
[im 108/175  bone]
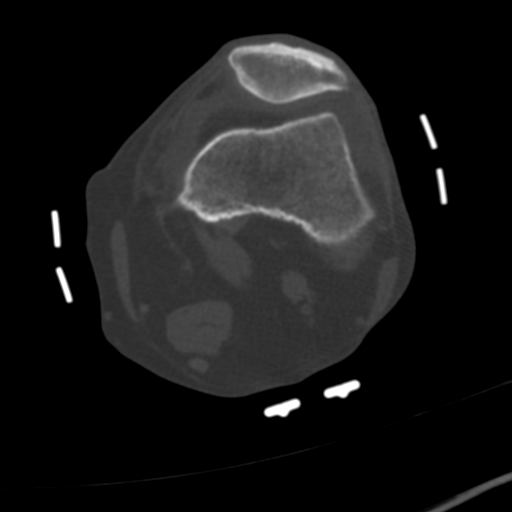
[im 148/175  bone]
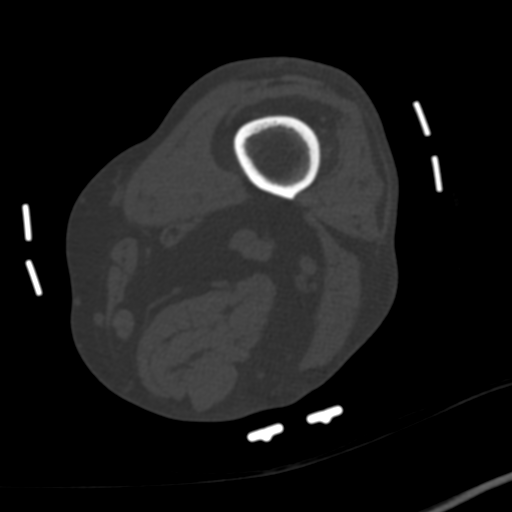

[Series 9: lower ext cor st · coronal · 0.38mm/px · 3 of 114 slices shown]
[im 23/114  bone]
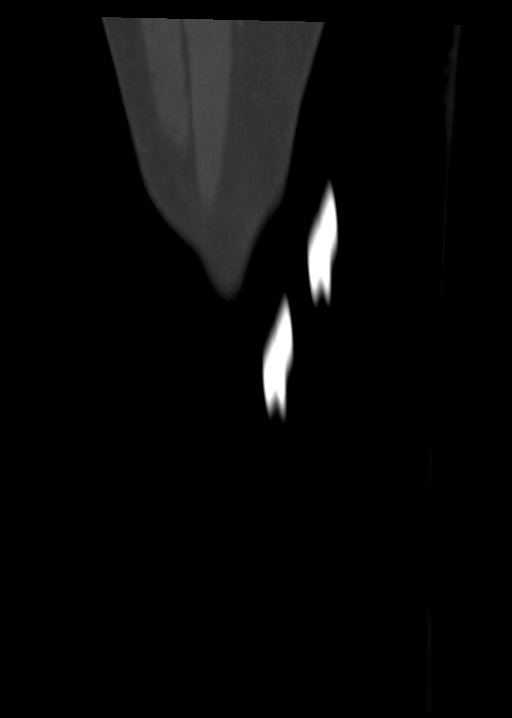
[im 46/114  bone]
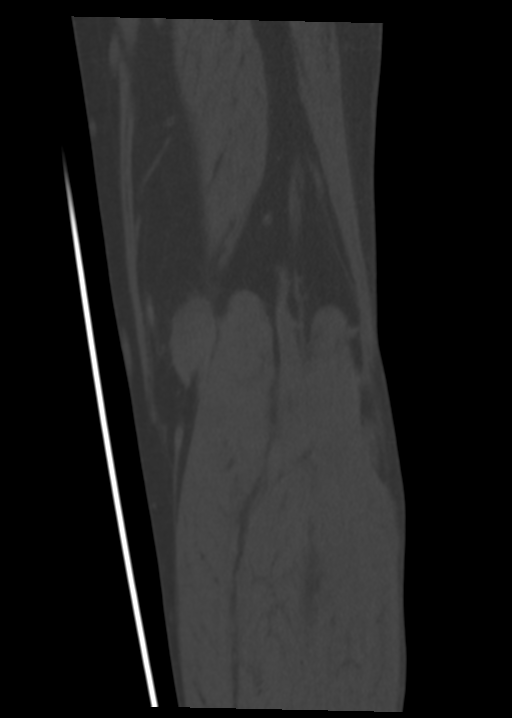
[im 68/114  bone]
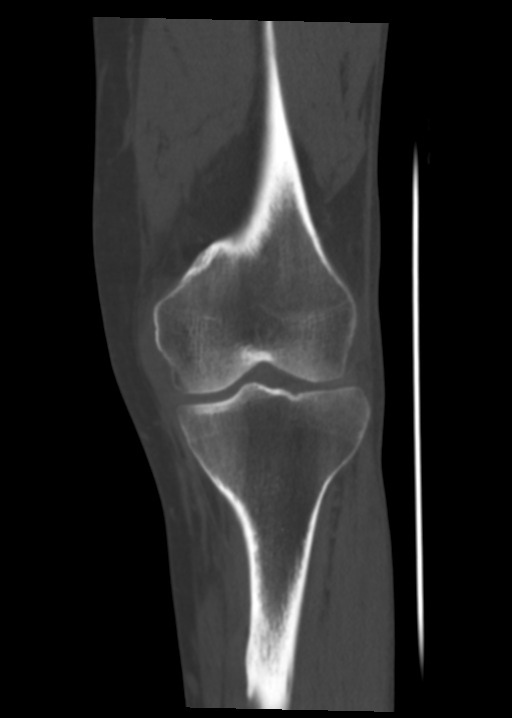

[Series 10: lower ext sag st · sagittal · 0.33mm/px · 5 of 120 slices shown, 6 images]
[im 40/120  bone]
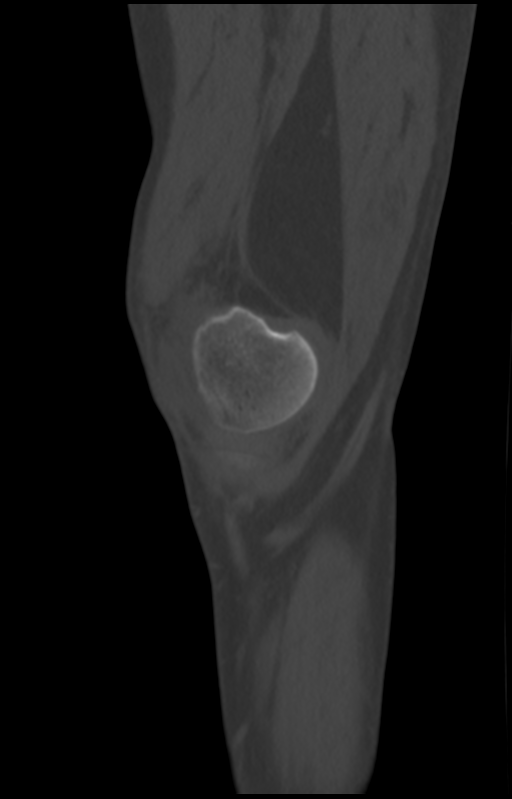
[im 50/120  bone]
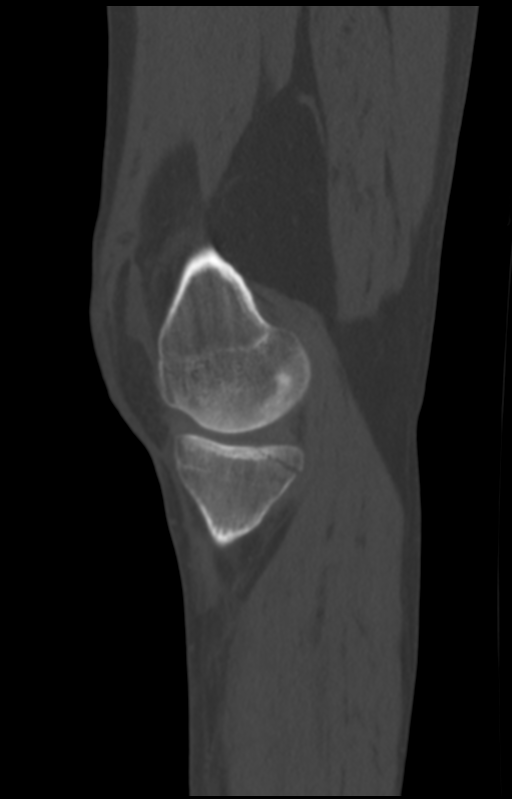
[im 60/120  soft-tissue]
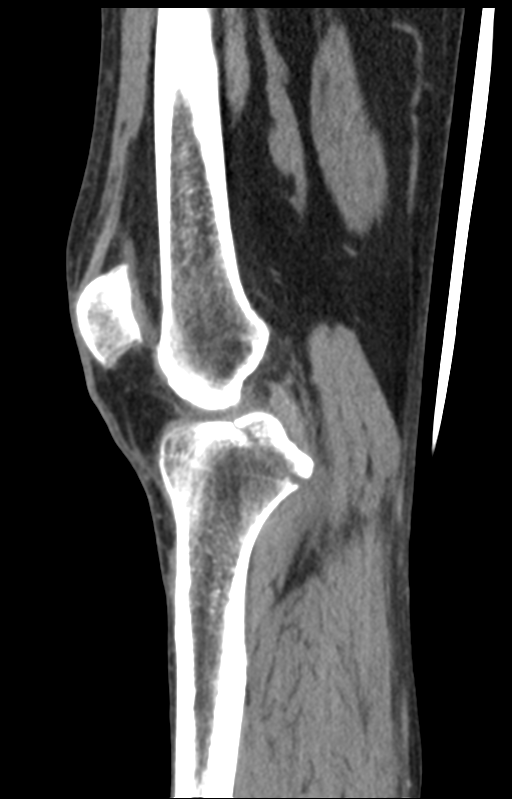
[im 60/120  bone]
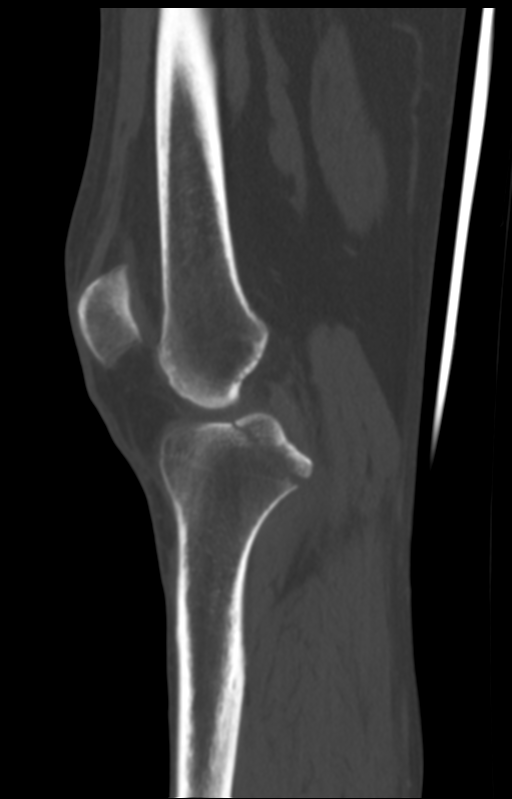
[im 70/120  bone]
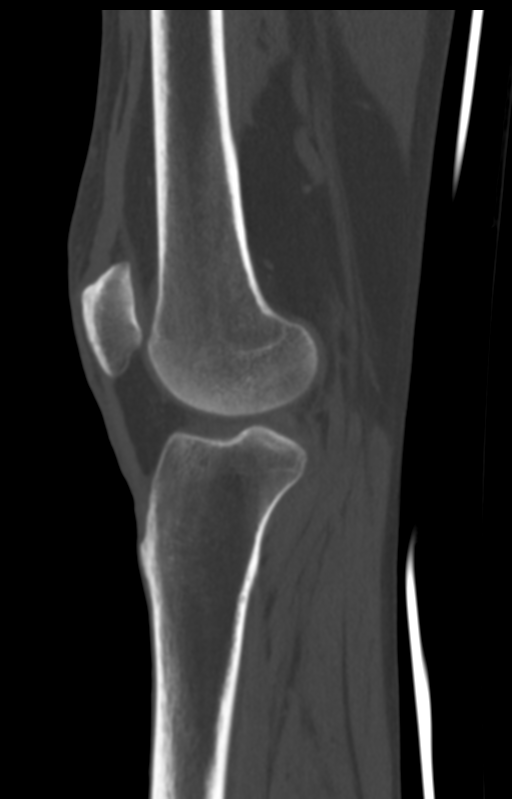
[im 80/120  bone]
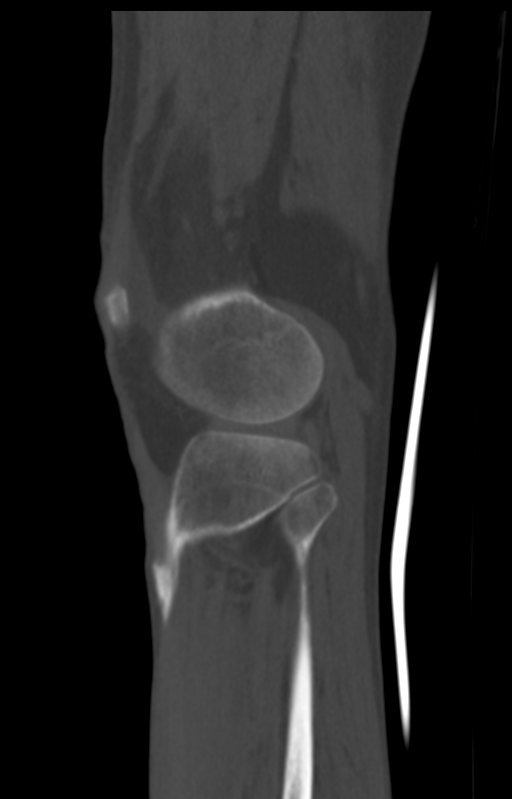

[12 of 33 positions shown; findings below may reference images not displayed]

FINDINGS: Bones/Joint/Cartilage

There is an avulsion fracture of the posterior aspect of the medial
tibial plateau extending across the midline involving the tibial
insertion of the posterior cruciate ligament. There is minimal
distraction of approximately 3 mm. There is no depression of
fracture fragments.

No other fractures.  Small joint effusion.

Ligaments

Suboptimally assessed by CT. ACL appears to be intact. Prominent
soft tissue swelling adjacent to the MCL suggesting the possibility
of a MCL sprain. LCL appears normal.

Muscles and Tendons

Negative.

Soft tissues

Negative.
IMPRESSION: Large avulsion fracture involving the posterior aspect of the medial
tibial plateau as well as the tibial attachment of the posterior
cruciate ligament.

## 2018-07-26 NOTE — Assessment & Plan Note (Signed)
Doing well and has follow up in May

## 2018-07-26 NOTE — Assessment & Plan Note (Signed)
Now healed.  He will finish his doxycycline, Keflex and stop (few days left) Has follow up with Dr. Eulah Pont tomorrow

## 2018-07-26 NOTE — Progress Notes (Signed)
   Subjective:    Patient ID: [name], male    DOB: 1965-12-16, 53 y.o.   MRN: [id number]  HPI He is here for a new problem of osteomyelitis. Has has HIV and well controlled and in August 2018 underwent ORIF for bimalleolar ankle fracture.  He had done well but developed more pain and some redness so taken to the OR by Dr. Wandra Feinstein on 1/31 and had hardware removed.  No pus noted on op report.  Debrided.  Started doxycycline and Keflex and now about 1 month.  He reports foot is healed, no swelling, no erythema, no warmth.      Review of Systems  Constitutional: Negative for fatigue.  Skin: Negative for rash.       Objective:   Physical Exam Constitutional:      Appearance: Normal appearance.  HENT:     Mouth/Throat:     Pharynx: No posterior oropharyngeal erythema.  Musculoskeletal:     Comments: Does not want to show me his foot  Skin:    Findings: No rash.  Neurological:     Mental Status: He is alert.           Assessment & Plan:

## 2018-07-27 DIAGNOSIS — M86171 Other acute osteomyelitis, right ankle and foot: Secondary | ICD-10-CM | POA: Diagnosis not present

## 2018-07-27 NOTE — Progress Notes (Signed)
Remote ICD transmission.   

## 2018-08-01 ENCOUNTER — Encounter: Payer: Self-pay | Admitting: Cardiology

## 2018-08-05 ENCOUNTER — Other Ambulatory Visit (HOSPITAL_COMMUNITY): Payer: Self-pay | Admitting: Internal Medicine

## 2018-08-23 DIAGNOSIS — L281 Prurigo nodularis: Secondary | ICD-10-CM | POA: Diagnosis not present

## 2018-08-23 DIAGNOSIS — L299 Pruritus, unspecified: Secondary | ICD-10-CM | POA: Diagnosis not present

## 2018-08-23 DIAGNOSIS — L280 Lichen simplex chronicus: Secondary | ICD-10-CM | POA: Diagnosis not present

## 2018-10-02 ENCOUNTER — Other Ambulatory Visit (HOSPITAL_COMMUNITY): Payer: Self-pay | Admitting: Internal Medicine

## 2018-10-07 ENCOUNTER — Telehealth: Payer: Self-pay | Admitting: Internal Medicine

## 2018-10-07 NOTE — Telephone Encounter (Signed)
COVID-19 Pre-Screening Questions: ° °Do you currently have a fever (>100 °F), chills or unexplained body aches? No  ° °Are you currently experiencing new cough, shortness of breath, sore throat, runny nose? No  °•  °Have you recently travelled outside the state of [place] in the last 14 days? No  °•  °Have you been in contact with someone that is currently pending confirmation of Covid19 testing or has been confirmed to have the Covid19 virus?  No  °

## 2018-10-10 ENCOUNTER — Other Ambulatory Visit (HOSPITAL_COMMUNITY)
Admission: RE | Admit: 2018-10-10 | Discharge: 2018-10-10 | Disposition: A | Discharge status: Home / Self Care | Payer: Medicare HMO | Source: Ambulatory Visit | Attending: Internal Medicine | Admitting: Internal Medicine

## 2018-10-10 ENCOUNTER — Other Ambulatory Visit: Payer: Self-pay

## 2018-10-10 ENCOUNTER — Other Ambulatory Visit: Payer: Medicare HMO

## 2018-10-10 DIAGNOSIS — Z21 Asymptomatic human immunodeficiency virus [HIV] infection status: Secondary | ICD-10-CM

## 2018-10-10 DIAGNOSIS — Z113 Encounter for screening for infections with a predominantly sexual mode of transmission: Secondary | ICD-10-CM | POA: Insufficient documentation

## 2018-10-10 DIAGNOSIS — R69 Illness, unspecified: Secondary | ICD-10-CM | POA: Diagnosis not present

## 2018-10-11 LAB — T-HELPER CELL (CD4) - (RCID CLINIC ONLY)
CD4 % Helper T Cell: 25 % — ABNORMAL LOW (ref 33–65)
CD4 T Cell Abs: 507 /uL (ref 400–1790)

## 2018-10-11 LAB — URINE CYTOLOGY ANCILLARY ONLY
Chlamydia: NEGATIVE
Neisseria Gonorrhea: NEGATIVE

## 2018-10-17 LAB — COMPLETE METABOLIC PANEL WITH GFR
AG Ratio: 1.6 (calc) (ref 1.0–2.5)
ALT: 26 U/L (ref 9–46)
AST: 34 U/L (ref 10–35)
Albumin: 4.7 g/dL (ref 3.6–5.1)
Alkaline phosphatase (APISO): 87 U/L (ref 35–144)
BUN/Creatinine Ratio: 13 (calc) (ref 6–22)
BUN: 18 mg/dL (ref 7–25)
CO2: 21 mmol/L (ref 20–32)
Calcium: 9.7 mg/dL (ref 8.6–10.3)
Chloride: 108 mmol/L (ref 98–110)
Creat: 1.34 mg/dL — ABNORMAL HIGH (ref 0.70–1.33)
GFR, Est African American: 69 mL/min/{1.73_m2} (ref >=60)
GFR, Est Non African American: 59 mL/min/{1.73_m2} — ABNORMAL LOW (ref >=60)
Globulin: 3 g/dL (calc) (ref 1.9–3.7)
Glucose, Bld: 107 mg/dL — ABNORMAL HIGH (ref 65–99)
Potassium: 4 mmol/L (ref 3.5–5.3)
Sodium: 140 mmol/L (ref 135–146)
Total Bilirubin: 0.5 mg/dL (ref 0.2–1.2)
Total Protein: 7.7 g/dL (ref 6.1–8.1)

## 2018-10-17 LAB — CBC WITH DIFFERENTIAL/PLATELET
Absolute Monocytes: 588 cells/uL (ref 200–950)
Basophils Absolute: 30 cells/uL (ref 0–200)
Basophils Relative: 0.5 %
Eosinophils Absolute: 90 cells/uL (ref 15–500)
Eosinophils Relative: 1.5 %
HCT: 41.9 % (ref 38.5–50.0)
Hemoglobin: 14.4 g/dL (ref 13.2–17.1)
Lymphs Abs: 2088 cells/uL (ref 850–3900)
MCH: 30.5 pg (ref 27.0–33.0)
MCHC: 34.4 g/dL (ref 32.0–36.0)
MCV: 88.8 fL (ref 80.0–100.0)
MPV: 10.5 fL (ref 7.5–12.5)
Monocytes Relative: 9.8 %
Neutro Abs: 3204 cells/uL (ref 1500–7800)
Neutrophils Relative %: 53.4 %
Platelets: 167 10*3/uL (ref 140–400)
RBC: 4.72 10*6/uL (ref 4.20–5.80)
RDW: 13.1 % (ref 11.0–15.0)
Total Lymphocyte: 34.8 %
WBC: 6 10*3/uL (ref 3.8–10.8)

## 2018-10-17 LAB — HIV-1 RNA QUANT-NO REFLEX-BLD
HIV 1 RNA Quant: <20 copies/mL
HIV-1 RNA Quant, Log: <1.3 Log copies/mL

## 2018-10-17 LAB — RPR: RPR Ser Ql: NONREACTIVE

## 2018-10-18 ENCOUNTER — Other Ambulatory Visit: Payer: Self-pay

## 2018-10-18 ENCOUNTER — Ambulatory Visit (INDEPENDENT_AMBULATORY_CARE_PROVIDER_SITE_OTHER): Payer: Medicare HMO | Admitting: *Deleted

## 2018-10-18 DIAGNOSIS — I5022 Chronic systolic (congestive) heart failure: Secondary | ICD-10-CM

## 2018-10-18 DIAGNOSIS — I428 Other cardiomyopathies: Secondary | ICD-10-CM

## 2018-10-19 ENCOUNTER — Telehealth: Payer: Self-pay

## 2018-10-19 ENCOUNTER — Other Ambulatory Visit: Payer: Self-pay | Admitting: Internal Medicine

## 2018-10-19 DIAGNOSIS — G4700 Insomnia, unspecified: Secondary | ICD-10-CM

## 2018-10-19 LAB — CUP PACEART REMOTE DEVICE CHECK
Battery Remaining Longevity: 24 mo
Battery Voltage: 2.92 V
Brady Statistic AP VP Percent: 0.03 %
Brady Statistic AP VS Percent: 0.01 %
Brady Statistic AS VP Percent: 98.63 %
Brady Statistic AS VS Percent: 1.32 %
Brady Statistic RA Percent Paced: 0.05 %
Brady Statistic RV Percent Paced: 1.48 %
Date Time Interrogation Session: 20200520114254
HighPow Impedance: 76 Ohm
Implantable Lead Implant Date: 20150126
Implantable Lead Implant Date: 20150126
Implantable Lead Implant Date: 20150126
Implantable Lead Location: 753858
Implantable Lead Location: 753859
Implantable Lead Location: 753860
Implantable Lead Model: 4396
Implantable Lead Model: 5076
Implantable Lead Model: 6935
Implantable Pulse Generator Implant Date: 20150126
Lead Channel Impedance Value: 399 Ohm
Lead Channel Impedance Value: 418 Ohm
Lead Channel Impedance Value: 475 Ohm
Lead Channel Impedance Value: 551 Ohm
Lead Channel Impedance Value: 551 Ohm
Lead Channel Impedance Value: 950 Ohm
Lead Channel Pacing Threshold Amplitude: 0.625 V
Lead Channel Pacing Threshold Amplitude: 0.625 V
Lead Channel Pacing Threshold Amplitude: 0.75 V
Lead Channel Pacing Threshold Pulse Width: 0.4 ms
Lead Channel Pacing Threshold Pulse Width: 0.4 ms
Lead Channel Pacing Threshold Pulse Width: 0.4 ms
Lead Channel Sensing Intrinsic Amplitude: 1.25 mV
Lead Channel Sensing Intrinsic Amplitude: 1.25 mV
Lead Channel Sensing Intrinsic Amplitude: 11.125 mV
Lead Channel Sensing Intrinsic Amplitude: 11.125 mV
Lead Channel Setting Pacing Amplitude: 1.75 V
Lead Channel Setting Pacing Amplitude: 2 V
Lead Channel Setting Pacing Amplitude: 2.5 V
Lead Channel Setting Pacing Pulse Width: 0.4 ms
Lead Channel Setting Pacing Pulse Width: 0.4 ms
Lead Channel Setting Sensing Sensitivity: 0.3 mV

## 2018-10-19 NOTE — Telephone Encounter (Signed)
Left message for patient to remind of missed remote transmission.  

## 2018-10-20 ENCOUNTER — Telehealth: Payer: Self-pay | Admitting: Internal Medicine

## 2018-10-20 NOTE — Telephone Encounter (Signed)
COVID-19 Pre-Screening Questions: ° °Do you currently have a fever (>100 °F), chills or unexplained body aches? No  ° °Are you currently experiencing new cough, shortness of breath, sore throat, runny nose? No  °•  °Have you recently travelled outside the state of [place] in the last 14 days? No  °•  °Have you been in contact with someone that is currently pending confirmation of Covid19 testing or has been confirmed to have the Covid19 virus?  No  °

## 2018-10-25 ENCOUNTER — Other Ambulatory Visit: Payer: Self-pay

## 2018-10-25 ENCOUNTER — Encounter: Payer: Self-pay | Admitting: Internal Medicine

## 2018-10-25 ENCOUNTER — Ambulatory Visit (INDEPENDENT_AMBULATORY_CARE_PROVIDER_SITE_OTHER): Payer: Medicare HMO | Admitting: Internal Medicine

## 2018-10-25 VITALS — BP 134/94 | HR 84 | Temp 98.0°F | Ht 64.0 in | Wt 197.0 lb

## 2018-10-25 DIAGNOSIS — K7400 Hepatic fibrosis, unspecified: Secondary | ICD-10-CM

## 2018-10-25 DIAGNOSIS — Z21 Asymptomatic human immunodeficiency virus [HIV] infection status: Secondary | ICD-10-CM

## 2018-10-25 DIAGNOSIS — M25421 Effusion, right elbow: Secondary | ICD-10-CM

## 2018-10-25 DIAGNOSIS — R69 Illness, unspecified: Secondary | ICD-10-CM | POA: Diagnosis not present

## 2018-10-25 NOTE — Assessment & Plan Note (Signed)
Will check an ultrasound next visit

## 2018-10-25 NOTE — Assessment & Plan Note (Signed)
New problem.  Will send him back to [facility] where he is known from previous osteomyelitis.

## 2018-10-25 NOTE — Assessment & Plan Note (Signed)
Doing well, no issues.  rtc 6 months.  

## 2018-10-25 NOTE — Progress Notes (Signed)
   Subjective:    Patient ID: [name], male    DOB: Oct 24, 1965, 53 y.o.   MRN: [id number]  HPI Here for follow up of HIV. Continues on [place] and denies any missed doses.  Previously completed treatment for chronic hepatitis C and SVR 24 negative.  CT of abdomen in December without focal liver abnormality.  Continues to follow with heart failure clinic.  No associated n/v/d.  CD4 of 507 and viral load < 20.   New issue is a fluid filled sac of his right elbow.  Not in the joint.  Not hot or erythematous.  Present for about 1 month.    Review of Systems  Constitutional: Negative for fatigue.  Gastrointestinal: Negative for diarrhea.  Skin: Negative for rash.       Objective:   Physical Exam Constitutional:      General: He is not in acute distress.    Appearance: He is well-developed.  HENT:     Mouth/Throat:     Pharynx: No oropharyngeal exudate.  Eyes:     General: No scleral icterus. Cardiovascular:     Rate and Rhythm: Normal rate and regular rhythm.     Heart sounds: Normal heart sounds. No murmur.  Pulmonary:     Effort: Pulmonary effort is normal. No respiratory distress.     Breath sounds: Normal breath sounds.  Lymphadenopathy:     Cervical: No cervical adenopathy.  Skin:    Findings: No rash.    SH: some alcohol still      Assessment & Plan:

## 2018-10-27 ENCOUNTER — Encounter: Payer: Self-pay | Admitting: Cardiology

## 2018-10-27 NOTE — Progress Notes (Signed)
Remote ICD transmission.   

## 2018-10-31 DIAGNOSIS — M7021 Olecranon bursitis, right elbow: Secondary | ICD-10-CM | POA: Diagnosis not present

## 2018-11-23 ENCOUNTER — Other Ambulatory Visit: Payer: Self-pay | Admitting: Internal Medicine

## 2018-11-23 DIAGNOSIS — G4700 Insomnia, unspecified: Secondary | ICD-10-CM

## 2018-11-30 ENCOUNTER — Other Ambulatory Visit (HOSPITAL_COMMUNITY): Payer: Self-pay | Admitting: Internal Medicine

## 2018-12-01 ENCOUNTER — Other Ambulatory Visit: Payer: Self-pay | Admitting: Internal Medicine

## 2018-12-15 ENCOUNTER — Telehealth (HOSPITAL_COMMUNITY): Payer: Self-pay | Admitting: Adult Health

## 2018-12-15 NOTE — Telephone Encounter (Signed)
Recv'd vm from PT regarding wanting to schedule an appt...returned call and left vm for PT due to no answer, asked PT to contact the office to schedule an appt.

## 2019-01-02 ENCOUNTER — Other Ambulatory Visit: Payer: Self-pay

## 2019-01-02 ENCOUNTER — Ambulatory Visit (HOSPITAL_COMMUNITY)
Admission: RE | Admit: 2019-01-02 | Discharge: 2019-01-02 | Disposition: A | Discharge status: Home / Self Care | Payer: Medicare HMO | Source: Ambulatory Visit | Attending: Internal Medicine | Admitting: Internal Medicine

## 2019-01-02 ENCOUNTER — Encounter (HOSPITAL_COMMUNITY): Payer: Self-pay

## 2019-01-02 VITALS — BP 128/86 | HR 83 | Wt 191.6 lb

## 2019-01-02 DIAGNOSIS — Z800 Family history of malignant neoplasm of digestive organs: Secondary | ICD-10-CM | POA: Diagnosis not present

## 2019-01-02 DIAGNOSIS — I428 Other cardiomyopathies: Secondary | ICD-10-CM | POA: Diagnosis not present

## 2019-01-02 DIAGNOSIS — Z801 Family history of malignant neoplasm of trachea, bronchus and lung: Secondary | ICD-10-CM | POA: Diagnosis not present

## 2019-01-02 DIAGNOSIS — B20 Human immunodeficiency virus [HIV] disease: Secondary | ICD-10-CM | POA: Insufficient documentation

## 2019-01-02 DIAGNOSIS — F1010 Alcohol abuse, uncomplicated: Secondary | ICD-10-CM

## 2019-01-02 DIAGNOSIS — M109 Gout, unspecified: Secondary | ICD-10-CM | POA: Insufficient documentation

## 2019-01-02 DIAGNOSIS — Z950 Presence of cardiac pacemaker: Secondary | ICD-10-CM | POA: Diagnosis not present

## 2019-01-02 DIAGNOSIS — I472 Ventricular tachycardia: Secondary | ICD-10-CM | POA: Diagnosis not present

## 2019-01-02 DIAGNOSIS — R69 Illness, unspecified: Secondary | ICD-10-CM | POA: Diagnosis not present

## 2019-01-02 DIAGNOSIS — Z791 Long term (current) use of non-steroidal anti-inflammatories (NSAID): Secondary | ICD-10-CM | POA: Diagnosis not present

## 2019-01-02 DIAGNOSIS — Z79899 Other long term (current) drug therapy: Secondary | ICD-10-CM | POA: Insufficient documentation

## 2019-01-02 DIAGNOSIS — F329 Major depressive disorder, single episode, unspecified: Secondary | ICD-10-CM | POA: Insufficient documentation

## 2019-01-02 DIAGNOSIS — I110 Hypertensive heart disease with heart failure: Secondary | ICD-10-CM | POA: Insufficient documentation

## 2019-01-02 DIAGNOSIS — I5022 Chronic systolic (congestive) heart failure: Secondary | ICD-10-CM | POA: Diagnosis not present

## 2019-01-02 DIAGNOSIS — I5082 Biventricular heart failure: Secondary | ICD-10-CM | POA: Diagnosis not present

## 2019-01-02 DIAGNOSIS — K219 Gastro-esophageal reflux disease without esophagitis: Secondary | ICD-10-CM | POA: Insufficient documentation

## 2019-01-02 LAB — COMPREHENSIVE METABOLIC PANEL
ALT: 38 U/L (ref 0–44)
AST: 61 U/L — ABNORMAL HIGH (ref 15–41)
Albumin: 3.8 g/dL (ref 3.5–5.0)
Alkaline Phosphatase: 81 U/L (ref 38–126)
Anion gap: 10 (ref 5–15)
BUN: 15 mg/dL (ref 6–20)
CO2: 18 mmol/L — ABNORMAL LOW (ref 22–32)
Calcium: 8.9 mg/dL (ref 8.9–10.3)
Chloride: 112 mmol/L — ABNORMAL HIGH (ref 98–111)
Creatinine, Ser: 1.38 mg/dL — ABNORMAL HIGH (ref 0.61–1.24)
GFR calc Af Amer: >60 mL/min (ref >60)
GFR calc non Af Amer: 57 mL/min — ABNORMAL LOW (ref >60)
Glucose, Bld: 104 mg/dL — ABNORMAL HIGH (ref 70–99)
Potassium: 3.6 mmol/L (ref 3.5–5.1)
Sodium: 140 mmol/L (ref 135–145)
Total Bilirubin: 0.6 mg/dL (ref 0.3–1.2)
Total Protein: 7.1 g/dL (ref 6.5–8.1)

## 2019-01-02 NOTE — Progress Notes (Signed)
Patient ID: David Ruddimothy L Sofranko, male   DOB: May 13, 1966, 53 y.o.   MRN: [id number]   [facility]  Patient ID: [name], male   DOB: May 13, 1966, 53 y.o.   MRN: [id number]  ---Medtronic Optivol ----   Primary Physician: Staci RighterOMER, ROBERT, MD  Primary Cardiologist: Dr Ladona Ridgelaylor  Primary HF: Dr. Gala RomneyBensimhon    HPI: [name] is a 53 y.o. male with a history of HIV 1998, HCV, ETOH abuse and CHF due to NICM s/p Medtronic  CRT-D (06/2013). He also has a h/o syncope and previously had an EP study which was non-inducible for VT.    Was admitted 01/19/13- 01/22/13 for acute on chronic systolic HF. ECHO EF 20-25%. He was diuresed with IV lasix. He was discharged on coreg 6.25 mg BID, digoxin 0.25 mg daily, lisinopril 40 mg daily, and spironolactone 25 mg daily. Discharge weight 150 lbs. He stopped spironolactone due to severe headaches.  He was not able to tolerate titration of Coreg to 25 mg bid due to lightheadedness.   Admitted 8/27-8/31/18 for syncope from low BP resulting in trimalleolar fracture of the right ankle and left proximal tibia. He underwent ORIF of right trimalleolar fracture. HF meds and diuretics adjusted. He was discharged to SNF.   Today he returns hf follow up. Overall feeling fine. Denies SOB/PND/Orthopnea. Drinking 6 beers -8 beers a day.  Appetite ok. No fever or chills. Weight at home 190-196 pounds. Taking all medications  ECHO 04/2013: EF 20-25%, mod/severe MR, LA mod dilated ECHO 10/16/13 EF 20-25%  ECHO 03/14/15 EF 55% ECHO 12/17 EF 55% ECHO 8/18 EF 65-70%.   SH: Lives with partner in [place], drinking a wine cooler twice a week (prior heavy ETOH).   Review of systems complete and found to be negative unless listed in HPI.    Family History  Problem Relation Age of Onset  . Lung cancer Mother   . Colon cancer Other 50   Past Medical History:  Diagnosis Date  . CHF (congestive heart failure) (HCC)   . Chronic systolic heart failure (HCC)    a. Echo 12/05/11:  EF 20-25%, diff HK with mild sparing of IL wall, mild AI, mod MR, mild LAE, mild RVE, mild reduced RVF.;  b.  Echo 05/11/2012:  Mild LVH, EF 20-25%, Gr 1 diast dysfn, mod MR, mild LAE  . Depression   . G6PD deficiency   . GERD (gastroesophageal reflux disease)   . Hepatitis    Hep B and Hep C (patient does not report this but these are listed in previous notes.)  . HIV infection (HCC)   . Hypertension   . NICM (nonischemic cardiomyopathy) (HCC)    cardia CTA 8/13 negative for obstructive CAD  . Presence of permanent cardiac pacemaker   . VT (ventricular tachycardia) (HCC)     Current Outpatient Medications  Medication Sig Dispense Refill  . amiodarone (PACERONE) 200 MG tablet TAKE 1 TABLET BY MOUTH EVERY DAY 90 tablet 1  . carvedilol (COREG) 25 MG tablet TAKE 1 TABLET BY MOUTH TWICE A DAY WITH MEALS 180 tablet 3  . clobetasol ointment (TEMOVATE) 0.05 % Apply 1 application topically 2 (two) times daily.  0  . colchicine 0.6 MG tablet Take 1 tablet (0.6 mg total) by mouth 2 (two) times daily as needed. For gout flares. 30 tablet 2  . diclofenac sodium (VOLTAREN) 1 % GEL Apply 2 g topically 4 (four) times daily. 100 g 11  . doxepin (SINEQUAN) 25 MG  capsule Take 1 capsule by mouth as needed.  5  . doxycycline (VIBRA-TABS) 100 MG tablet Take 1 tablet (100 mg total) by mouth 2 (two) times daily. 60 tablet 0  . Elbasvir-Grazoprevir (ZEPATIER) 50-100 MG TABS Take 1 tablet by mouth daily. 28 tablet 2  . ENTRESTO 97-103 MG TAKE 1 TABLET BY MOUTH TWICE DAILY 60 tablet 3  . famotidine (PEPCID) 20 MG tablet TAKE 1 TABLET(20 MG) BY MOUTH TWICE DAILY 180 tablet 0  . furosemide (LASIX) 40 MG tablet TAKE 1 TABLET BY MOUTH EVERY DAY AS NEEDED 90 tablet 1  . gabapentin (NEURONTIN) 300 MG capsule Take 300 mg by mouth every evening.    . hydrALAZINE (APRESOLINE) 50 MG tablet Take 1 tablet (50 mg total) by mouth 3 (three) times daily. Do not take if your systolic blood pressure is less that  120. 120 tablet 11  . hydrOXYzine (ATARAX/VISTARIL) 50 MG tablet take 1 tablet by mouth three times a day if needed 15 tablet 1  . mirtazapine (REMERON) 7.5 MG tablet TAKE 1 TABLET(7.5 MG) BY MOUTH AT BEDTIME 30 tablet 5  . ODEFSEY 200-25-25 MG TABS tablet TAKE 1 TABLET BY MOUTH EVERY DAY WITH BREAKFAST 30 tablet 4  . ondansetron (ZOFRAN) 4 MG tablet Take 4 mg by mouth as needed.  0  . spironolactone (ALDACTONE) 25 MG tablet TAKE 1 TABLET(25 MG) BY MOUTH AT BEDTIME 30 tablet 3  . TIVICAY 50 MG tablet TAKE 1 TABLET BY MOUTH ONCE DAILY WITH LUNCH 30 tablet 4  . triamcinolone cream (KENALOG) 0.1 % Apply 1 application topically 3 (three) times daily.  0  . triamcinolone ointment (KENALOG) 0.1 % Apply 1 application topically as needed.  5  . valACYclovir (VALTREX) 1000 MG tablet Take 1 tablet (1,000 mg total) by mouth 2 (two) times daily as needed (outbreaks). Take for 5 days for an outbreak 10 tablet 6   No current facility-administered medications for this encounter.     Vitals:   01/02/19 1159  BP: 128/86  Pulse: 83  SpO2: 97%  Weight: 86.9 kg (191 lb 9.6 oz)   Wt Readings from Last 3 Encounters:  01/02/19 86.9 kg (191 lb 9.6 oz)  10/25/18 89.4 kg (197 lb)  07/26/18 88 kg (194 lb)    Physical Exam:  General:  Well appearing. No resp difficulty HEENT: normal Neck: supple. no JVD. Carotids 2+ bilat; no bruits. No lymphadenopathy or thryomegaly appreciated. Cor: PMI nondisplaced. Regular rate & rhythm. No rubs, gallops or murmurs. Lungs: clear Abdomen: soft, nontender, nondistended. No hepatosplenomegaly. No bruits or masses. Good bowel sounds. Extremities: no cyanosis, clubbing, rash, edema Neuro: alert & orientedx3, cranial nerves grossly intact. moves all 4 extremities w/o difficulty. Affect pleasant  ASSESSMENT & PLAN:  1) HTN Stable.  Continue current regimen.   2) Chronic systolic HF; now with recovered EF: Nonischemic cardiomyopathy, ?due to ETOH abuse. EF 55% (03/2015)  s/p Medtronic CRT-D (06/2013). 12/2016 ECHO EF 65-70%.  - Repeat ECHO.  -NYHA II. Volume status stable.  - Continue Coreg 25 mg twice a day.   - Continue spiro to 25 mg daily.  - Continue Entresto 97/103 mg BID. - Repeat ECHO  - Check CMET today.   3) Ventricular tachycardia:  - Continue Amio 200 mg daily.  - No VT on interrogation.  - Discussed the need to yearly eye exams.   4) HIV - Stable.  - Followed by Dr Luciana Axe.   5) ETOH abuse - Discussed alcohol cessation.  Needs repeat ECHO . FOllow up in 6 months.     Darrick Grinder, NP  01/02/2019 12:11 PM

## 2019-01-02 NOTE — Patient Instructions (Signed)
Labs done today. We will call you only if labs are abnormal.  Your physician recommends that you schedule a follow-up appointment in: 6 months. We will contact you to schedule an appointment.   Your physician has requested that you have an echocardiogram. Echocardiography is a painless test that uses sound waves to create images of your heart. It provides your doctor with information about the size and shape of your heart and how well your heart's chambers and valves are working. This procedure takes approximately one hour. There are no restrictions for this procedure.  At the [facility], you and your health needs are our priority. As part of our continuing mission to provide you with exceptional heart care, we have created designated Provider Care Teams. These Care Teams include your primary Cardiologist (physician) and Advanced Practice Providers (APPs- Physician Assistants and Nurse Practitioners) who all work together to provide you with the care you need, when you need it.   You may see any of the following providers on your designated Care Team at your next follow up: Marland Kitchen Dr Glori Bickers . Dr Loralie Champagne . Darrick Grinder, NP   Please be sure to bring in all your medications bottles to every appointment.

## 2019-01-05 ENCOUNTER — Ambulatory Visit (HOSPITAL_COMMUNITY)
Admission: RE | Admit: 2019-01-05 | Discharge: 2019-01-05 | Disposition: A | Discharge status: Home / Self Care | Payer: Medicare HMO | Source: Ambulatory Visit | Attending: Adult Health | Admitting: Adult Health

## 2019-01-05 ENCOUNTER — Other Ambulatory Visit: Payer: Self-pay

## 2019-01-05 DIAGNOSIS — I5022 Chronic systolic (congestive) heart failure: Secondary | ICD-10-CM

## 2019-01-05 DIAGNOSIS — R000 Tachycardia, unspecified: Secondary | ICD-10-CM | POA: Insufficient documentation

## 2019-01-05 DIAGNOSIS — I429 Cardiomyopathy, unspecified: Secondary | ICD-10-CM | POA: Diagnosis not present

## 2019-01-05 NOTE — Progress Notes (Signed)
  Echocardiogram 2D Echocardiogram has been performed.  [name] 01/05/2019, 11:54 AM

## 2019-01-06 ENCOUNTER — Telehealth (HOSPITAL_COMMUNITY): Payer: Self-pay

## 2019-01-06 ENCOUNTER — Other Ambulatory Visit: Payer: Self-pay

## 2019-01-06 NOTE — Telephone Encounter (Signed)
-----   Message from Conrad Stewardson, NP sent at 01/05/2019  4:41 PM EDT ----- Please call ECHO is stable. EF normal. Great news!

## 2019-01-06 NOTE — Telephone Encounter (Signed)
Called pt. Relayed echo results. Pt verbalized understanding.

## 2019-01-17 ENCOUNTER — Ambulatory Visit (INDEPENDENT_AMBULATORY_CARE_PROVIDER_SITE_OTHER): Payer: Medicare HMO | Admitting: *Deleted

## 2019-01-17 DIAGNOSIS — I428 Other cardiomyopathies: Secondary | ICD-10-CM

## 2019-01-17 DIAGNOSIS — I5022 Chronic systolic (congestive) heart failure: Secondary | ICD-10-CM

## 2019-01-17 LAB — CUP PACEART REMOTE DEVICE CHECK
Battery Remaining Longevity: 20 mo
Battery Voltage: 2.92 V
Brady Statistic AP VP Percent: 0.04 %
Brady Statistic AP VS Percent: 0.02 %
Brady Statistic AS VP Percent: 98.67 %
Brady Statistic AS VS Percent: 1.27 %
Brady Statistic RA Percent Paced: 0.05 %
Brady Statistic RV Percent Paced: 8.5 %
Date Time Interrogation Session: 20200818160025
HighPow Impedance: 94 Ohm
Implantable Lead Implant Date: 20150126
Implantable Lead Implant Date: 20150126
Implantable Lead Implant Date: 20150126
Implantable Lead Location: 753858
Implantable Lead Location: 753859
Implantable Lead Location: 753860
Implantable Lead Model: 4396
Implantable Lead Model: 5076
Implantable Lead Model: 6935
Implantable Pulse Generator Implant Date: 20150126
Lead Channel Impedance Value: 418 Ohm
Lead Channel Impedance Value: 418 Ohm
Lead Channel Impedance Value: 475 Ohm
Lead Channel Impedance Value: 551 Ohm
Lead Channel Impedance Value: 589 Ohm
Lead Channel Impedance Value: 988 Ohm
Lead Channel Pacing Threshold Amplitude: 0.5 V
Lead Channel Pacing Threshold Amplitude: 0.625 V
Lead Channel Pacing Threshold Amplitude: 0.75 V
Lead Channel Pacing Threshold Pulse Width: 0.4 ms
Lead Channel Pacing Threshold Pulse Width: 0.4 ms
Lead Channel Pacing Threshold Pulse Width: 0.4 ms
Lead Channel Sensing Intrinsic Amplitude: 1.125 mV
Lead Channel Sensing Intrinsic Amplitude: 1.125 mV
Lead Channel Sensing Intrinsic Amplitude: 13 mV
Lead Channel Sensing Intrinsic Amplitude: 13 mV
Lead Channel Setting Pacing Amplitude: 2 V
Lead Channel Setting Pacing Amplitude: 2 V
Lead Channel Setting Pacing Amplitude: 2.5 V
Lead Channel Setting Pacing Pulse Width: 0.4 ms
Lead Channel Setting Pacing Pulse Width: 0.4 ms
Lead Channel Setting Sensing Sensitivity: 0.3 mV

## 2019-01-26 ENCOUNTER — Encounter: Payer: Self-pay | Admitting: Cardiology

## 2019-01-26 NOTE — Progress Notes (Signed)
Remote ICD transmission.   

## 2019-02-08 ENCOUNTER — Other Ambulatory Visit: Payer: Self-pay | Admitting: Internal Medicine

## 2019-02-08 ENCOUNTER — Other Ambulatory Visit: Payer: Self-pay

## 2019-02-08 DIAGNOSIS — B20 Human immunodeficiency virus [HIV] disease: Secondary | ICD-10-CM

## 2019-02-08 MED ORDER — TIVICAY 50 MG PO TABS: ORAL_TABLET | ORAL | 4 refills | Status: DC

## 2019-03-06 ENCOUNTER — Other Ambulatory Visit: Payer: Self-pay

## 2019-03-06 ENCOUNTER — Ambulatory Visit (INDEPENDENT_AMBULATORY_CARE_PROVIDER_SITE_OTHER): Payer: Medicare HMO

## 2019-03-06 DIAGNOSIS — Z23 Encounter for immunization: Secondary | ICD-10-CM | POA: Diagnosis not present

## 2019-04-18 ENCOUNTER — Ambulatory Visit (INDEPENDENT_AMBULATORY_CARE_PROVIDER_SITE_OTHER): Payer: Medicare HMO | Admitting: *Deleted

## 2019-04-18 DIAGNOSIS — I472 Ventricular tachycardia, unspecified: Secondary | ICD-10-CM

## 2019-04-18 DIAGNOSIS — R55 Syncope and collapse: Secondary | ICD-10-CM | POA: Diagnosis not present

## 2019-04-20 ENCOUNTER — Telehealth: Payer: Self-pay

## 2019-04-20 ENCOUNTER — Other Ambulatory Visit (HOSPITAL_COMMUNITY): Payer: Self-pay

## 2019-04-20 MED ORDER — HYDRALAZINE HCL 50 MG PO TABS: 50.0000 mg | ORAL_TABLET | Freq: Three times a day (TID) | ORAL | 11 refills | Status: DC

## 2019-04-20 NOTE — Telephone Encounter (Signed)
Left message for patient to remind of missed remote transmission.  

## 2019-04-23 LAB — CUP PACEART REMOTE DEVICE CHECK
Battery Remaining Longevity: 19 mo
Battery Voltage: 2.91 V
Brady Statistic AP VP Percent: 0.04 %
Brady Statistic AP VS Percent: 0.02 %
Brady Statistic AS VP Percent: 98.6 %
Brady Statistic AS VS Percent: 1.34 %
Brady Statistic RA Percent Paced: 0.06 %
Brady Statistic RV Percent Paced: 9.28 %
Date Time Interrogation Session: 20201120230831
HighPow Impedance: 80 Ohm
Implantable Lead Implant Date: 20150126
Implantable Lead Implant Date: 20150126
Implantable Lead Implant Date: 20150126
Implantable Lead Location: 753858
Implantable Lead Location: 753859
Implantable Lead Location: 753860
Implantable Lead Model: 4396
Implantable Lead Model: 5076
Implantable Lead Model: 6935
Implantable Pulse Generator Implant Date: 20150126
Lead Channel Impedance Value: 399 Ohm
Lead Channel Impedance Value: 456 Ohm
Lead Channel Impedance Value: 513 Ohm
Lead Channel Impedance Value: 532 Ohm
Lead Channel Impedance Value: 551 Ohm
Lead Channel Impedance Value: 893 Ohm
Lead Channel Pacing Threshold Amplitude: 0.75 V
Lead Channel Pacing Threshold Amplitude: 0.75 V
Lead Channel Pacing Threshold Amplitude: 1 V
Lead Channel Pacing Threshold Pulse Width: 0.4 ms
Lead Channel Pacing Threshold Pulse Width: 0.4 ms
Lead Channel Pacing Threshold Pulse Width: 0.4 ms
Lead Channel Sensing Intrinsic Amplitude: 1.25 mV
Lead Channel Sensing Intrinsic Amplitude: 1.25 mV
Lead Channel Sensing Intrinsic Amplitude: 12.875 mV
Lead Channel Sensing Intrinsic Amplitude: 12.875 mV
Lead Channel Setting Pacing Amplitude: 2 V
Lead Channel Setting Pacing Amplitude: 2 V
Lead Channel Setting Pacing Amplitude: 2.5 V
Lead Channel Setting Pacing Pulse Width: 0.4 ms
Lead Channel Setting Pacing Pulse Width: 0.4 ms
Lead Channel Setting Sensing Sensitivity: 0.3 mV

## 2019-04-24 ENCOUNTER — Other Ambulatory Visit: Payer: Self-pay | Admitting: Internal Medicine

## 2019-04-24 DIAGNOSIS — B20 Human immunodeficiency virus [HIV] disease: Secondary | ICD-10-CM

## 2019-04-24 MED ORDER — ODEFSEY 200-25-25 MG PO TABS: 1.0000 | ORAL_TABLET | Freq: Every day | ORAL | 4 refills | Status: DC

## 2019-05-01 ENCOUNTER — Other Ambulatory Visit: Payer: Self-pay

## 2019-05-01 ENCOUNTER — Other Ambulatory Visit: Payer: Medicare HMO

## 2019-05-01 DIAGNOSIS — I5022 Chronic systolic (congestive) heart failure: Secondary | ICD-10-CM

## 2019-05-01 DIAGNOSIS — Z21 Asymptomatic human immunodeficiency virus [HIV] infection status: Secondary | ICD-10-CM

## 2019-05-01 DIAGNOSIS — R69 Illness, unspecified: Secondary | ICD-10-CM | POA: Diagnosis not present

## 2019-05-02 LAB — T-HELPER CELL (CD4) - (RCID CLINIC ONLY)
CD4 % Helper T Cell: 32 % — ABNORMAL LOW (ref 33–65)
CD4 T Cell Abs: 659 /uL (ref 400–1790)

## 2019-05-09 LAB — HIV-1 RNA QUANT-NO REFLEX-BLD
HIV 1 RNA Quant: 37 copies/mL — ABNORMAL HIGH
HIV-1 RNA Quant, Log: 1.57 Log copies/mL — ABNORMAL HIGH

## 2019-05-15 ENCOUNTER — Ambulatory Visit (INDEPENDENT_AMBULATORY_CARE_PROVIDER_SITE_OTHER): Payer: Medicare HMO | Admitting: Internal Medicine

## 2019-05-15 ENCOUNTER — Encounter: Payer: Self-pay | Admitting: Internal Medicine

## 2019-05-15 ENCOUNTER — Other Ambulatory Visit: Payer: Self-pay

## 2019-05-15 VITALS — BP 165/129 | HR 66 | Wt 191.6 lb

## 2019-05-15 DIAGNOSIS — R69 Illness, unspecified: Secondary | ICD-10-CM | POA: Diagnosis not present

## 2019-05-15 DIAGNOSIS — Z21 Asymptomatic human immunodeficiency virus [HIV] infection status: Secondary | ICD-10-CM

## 2019-05-15 DIAGNOSIS — Z113 Encounter for screening for infections with a predominantly sexual mode of transmission: Secondary | ICD-10-CM | POA: Diagnosis not present

## 2019-05-15 DIAGNOSIS — Z79899 Other long term (current) drug therapy: Secondary | ICD-10-CM | POA: Diagnosis not present

## 2019-05-15 DIAGNOSIS — B20 Human immunodeficiency virus [HIV] disease: Secondary | ICD-10-CM

## 2019-05-15 DIAGNOSIS — K7460 Unspecified cirrhosis of liver: Secondary | ICD-10-CM | POA: Diagnosis not present

## 2019-05-15 MED ORDER — ODEFSEY 200-25-25 MG PO TABS: 1.0000 | ORAL_TABLET | Freq: Every day | ORAL | 11 refills | Status: DC

## 2019-05-15 MED ORDER — TIVICAY 50 MG PO TABS: ORAL_TABLET | ORAL | 11 refills | Status: DC

## 2019-05-15 NOTE — Progress Notes (Signed)
Remote ICD transmission.   

## 2019-05-15 NOTE — Assessment & Plan Note (Signed)
Will get him scheduled for [facility] screening

## 2019-05-15 NOTE — Progress Notes (Signed)
   Subjective:    Patient ID: [name], male    DOB: 12-29-1965, 53 y.o.   MRN: [id number]  HPI Here for follow up of HIV Continues on [place] and Tivicay and no new issues.  No missed doses.  CD4 659, viral load 37.  No new complaints.    Review of Systems  Constitutional: Negative for fatigue.  Gastrointestinal: Negative for diarrhea.  Skin: Negative for rash.       Objective:   Physical Exam Constitutional:      General: He is not in acute distress.    Appearance: He is well-developed.  Eyes:     General: No scleral icterus. Cardiovascular:     Rate and Rhythm: Normal rate and regular rhythm.     Heart sounds: Normal heart sounds. No murmur.  Pulmonary:     Effort: Pulmonary effort is normal. No respiratory distress.     Breath sounds: Normal breath sounds.  Lymphadenopathy:     Cervical: No cervical adenopathy.  Skin:    Findings: No rash.    SH: some alcohol still      Assessment & Plan:

## 2019-05-15 NOTE — Assessment & Plan Note (Signed)
Doing well, no issues.  Continue with current medications.

## 2019-05-17 ENCOUNTER — Other Ambulatory Visit (HOSPITAL_COMMUNITY): Payer: Self-pay

## 2019-05-17 MED ORDER — ENTRESTO 97-103 MG PO TABS: 1.0000 | ORAL_TABLET | Freq: Two times a day (BID) | ORAL | 3 refills | Status: DC

## 2019-05-30 ENCOUNTER — Ambulatory Visit
Admission: RE | Admit: 2019-05-30 | Discharge: 2019-05-30 | Disposition: A | Discharge status: Home / Self Care | Payer: Medicare HMO | Source: Ambulatory Visit | Attending: Internal Medicine | Admitting: Internal Medicine

## 2019-05-30 DIAGNOSIS — K7460 Unspecified cirrhosis of liver: Secondary | ICD-10-CM | POA: Diagnosis not present

## 2019-06-07 DIAGNOSIS — I110 Hypertensive heart disease with heart failure: Secondary | ICD-10-CM | POA: Diagnosis not present

## 2019-06-07 DIAGNOSIS — E261 Secondary hyperaldosteronism: Secondary | ICD-10-CM | POA: Diagnosis not present

## 2019-06-07 DIAGNOSIS — D6869 Other thrombophilia: Secondary | ICD-10-CM | POA: Diagnosis not present

## 2019-06-07 DIAGNOSIS — R69 Illness, unspecified: Secondary | ICD-10-CM | POA: Diagnosis not present

## 2019-06-07 DIAGNOSIS — I509 Heart failure, unspecified: Secondary | ICD-10-CM | POA: Diagnosis not present

## 2019-06-07 DIAGNOSIS — G4700 Insomnia, unspecified: Secondary | ICD-10-CM | POA: Diagnosis not present

## 2019-06-07 DIAGNOSIS — G629 Polyneuropathy, unspecified: Secondary | ICD-10-CM | POA: Diagnosis not present

## 2019-06-07 DIAGNOSIS — Z008 Encounter for other general examination: Secondary | ICD-10-CM | POA: Diagnosis not present

## 2019-06-07 DIAGNOSIS — E669 Obesity, unspecified: Secondary | ICD-10-CM | POA: Diagnosis not present

## 2019-06-07 DIAGNOSIS — I4891 Unspecified atrial fibrillation: Secondary | ICD-10-CM | POA: Diagnosis not present

## 2019-06-08 ENCOUNTER — Telehealth: Payer: Self-pay | Admitting: *Deleted

## 2019-06-08 NOTE — Telephone Encounter (Signed)
Patient calling for abdominal ultrasound results. Please advise. [name]

## 2019-06-08 NOTE — Telephone Encounter (Signed)
Looks good, no concerns. thanks 

## 2019-06-12 NOTE — Telephone Encounter (Signed)
Patient made aware of results.  [name]  

## 2019-06-29 ENCOUNTER — Other Ambulatory Visit: Payer: Self-pay

## 2019-06-29 ENCOUNTER — Ambulatory Visit (INDEPENDENT_AMBULATORY_CARE_PROVIDER_SITE_OTHER): Payer: Medicare HMO | Admitting: Internal Medicine

## 2019-06-29 ENCOUNTER — Encounter: Payer: Self-pay | Admitting: Internal Medicine

## 2019-06-29 VITALS — BP 116/72 | HR 77 | Ht 64.0 in | Wt 184.6 lb

## 2019-06-29 DIAGNOSIS — I472 Ventricular tachycardia: Secondary | ICD-10-CM | POA: Diagnosis not present

## 2019-06-29 DIAGNOSIS — Z95810 Presence of automatic (implantable) cardiac defibrillator: Secondary | ICD-10-CM | POA: Diagnosis not present

## 2019-06-29 DIAGNOSIS — R000 Tachycardia, unspecified: Secondary | ICD-10-CM

## 2019-06-29 DIAGNOSIS — I5022 Chronic systolic (congestive) heart failure: Secondary | ICD-10-CM

## 2019-06-29 NOTE — Progress Notes (Signed)
HPI [name] returns today for followup. He is a pleasant middle aged man with chronic systolic heart failure and VT, prior ETOH abuse, s/p ICD insertion. His VT has been well controlled on amiodarone and with cessation of ETOH abuse. He has not had syncope. He admits to dietary indiscretion and has not been working out. He has gained over 30 lbs in the past 5 years. He denies chest pain or sob.  Allergies  Allergen Reactions  . Bactrim Rash     Current Outpatient Medications  Medication Sig Dispense Refill  . amiodarone (PACERONE) 200 MG tablet TAKE 1 TABLET BY MOUTH EVERY DAY 90 tablet 1  . carvedilol (COREG) 25 MG tablet TAKE 1 TABLET BY MOUTH TWICE A DAY WITH MEALS 180 tablet 3  . clobetasol ointment (TEMOVATE) 0.05 % Apply 1 application topically 2 (two) times daily.  0  . colchicine 0.6 MG tablet Take 1 tablet (0.6 mg total) by mouth 2 (two) times daily as needed. For gout flares. 30 tablet 2  . diclofenac sodium (VOLTAREN) 1 % GEL Apply 2 g topically 4 (four) times daily. 100 g 11  . dolutegravir (TIVICAY) 50 MG tablet 1 tab daily 30 tablet 11  . doxepin (SINEQUAN) 25 MG capsule Take 1 capsule by mouth as needed.  5  . emtricitabine-rilpivir-tenofovir AF (ODEFSEY) 200-25-25 MG TABS tablet Take 1 tablet by mouth daily. 30 tablet 11  . famotidine (PEPCID) 20 MG tablet TAKE 1 TABLET(20 MG) BY MOUTH TWICE DAILY 180 tablet 0  . furosemide (LASIX) 40 MG tablet TAKE 1 TABLET BY MOUTH EVERY DAY AS NEEDED 90 tablet 1  . gabapentin (NEURONTIN) 300 MG capsule Take 300 mg by mouth every evening.    . hydrALAZINE (APRESOLINE) 50 MG tablet Take 1 tablet (50 mg total) by mouth 3 (three) times daily. Do not take if your systolic blood pressure is less that 120. 120 tablet 11  . hydrOXYzine (ATARAX/VISTARIL) 50 MG tablet take 1 tablet by mouth three times a day if needed 15 tablet 1  . mirtazapine (REMERON) 7.5 MG tablet TAKE 1 TABLET(7.5 MG) BY MOUTH AT BEDTIME 30 tablet 5  . ondansetron  (ZOFRAN) 4 MG tablet Take 4 mg by mouth as needed.  0  . sacubitril-valsartan (ENTRESTO) 97-103 MG Take 1 tablet by mouth 2 (two) times daily. 60 tablet 3  . spironolactone (ALDACTONE) 25 MG tablet TAKE 1 TABLET(25 MG) BY MOUTH AT BEDTIME 30 tablet 3  . triamcinolone cream (KENALOG) 0.1 % Apply 1 application topically 3 (three) times daily.  0  . triamcinolone ointment (KENALOG) 0.1 % Apply 1 application topically as needed.  5  . valACYclovir (VALTREX) 1000 MG tablet Take 1 tablet (1,000 mg total) by mouth 2 (two) times daily as needed (outbreaks). Take for 5 days for an outbreak 10 tablet 6   No current facility-administered medications for this visit.     Past Medical History:  Diagnosis Date  . CHF (congestive heart failure) (HCC)   . Chronic systolic heart failure (HCC)    a. Echo 12/05/11:  EF 20-25%, diff HK with mild sparing of IL wall, mild AI, mod MR, mild LAE, mild RVE, mild reduced RVF.;  b.  Echo 05/11/2012:  Mild LVH, EF 20-25%, Gr 1 diast dysfn, mod MR, mild LAE  . Depression   . G6PD deficiency   . GERD (gastroesophageal reflux disease)   . Hepatitis    Hep B and Hep C (patient does not report this  but these are listed in previous notes.)  . HIV infection (HCC)   . Hypertension   . NICM (nonischemic cardiomyopathy) (HCC)    cardia CTA 8/13 negative for obstructive CAD  . Presence of permanent cardiac pacemaker   . VT (ventricular tachycardia) (HCC)     ROS:   All systems reviewed and negative except as noted in the HPI.   Past Surgical History:  Procedure Laterality Date  . BI-VENTRICULAR PACEMAKER INSERTION N/A 06/26/2013   Procedure: BI-VENTRICULAR PACEMAKER INSERTION (CRT-P);  Surgeon: Marinus Maw, MD;  Location[facility] CATH LAB;  Service: Cardiovascular;  Laterality: N/A;  . COLONOSCOPY WITH PROPOFOL N/A 08/14/2016   Procedure: COLONOSCOPY WITH PROPOFOL;  Surgeon: Jeani Hawking, MD;  Location: [facility] ENDOSCOPY;  Service: Endoscopy;  Laterality: N/A;  .  ELECTROPHYSIOLOGY STUDY N/A 02/19/2012   Procedure: ELECTROPHYSIOLOGY STUDY;  Surgeon: Marinus Maw, MD;  Location: [facility] CATH LAB;  Service: Cardiovascular;  Laterality: N/A;  . ESOPHAGOGASTRODUODENOSCOPY  12/07/2011   Procedure: ESOPHAGOGASTRODUODENOSCOPY (EGD);  Surgeon: [name], MD,FACG;  Location[facility] ENDOSCOPY;  Service: Endoscopy;  Laterality: N/A;  . FINGER SURGERY     Thumb laceration.    Marland Kitchen HARDWARE REMOVAL Right 07/01/2018   Procedure: HARDWARE REMOVAL;  Surgeon: Sheral Apley, MD;  Location: [facility];  Service: Orthopedics;  Laterality: Right;  . INCISION AND DRAINAGE Right 07/01/2018   Procedure: INCISION AND DRAINAGE;  Surgeon: Sheral Apley, MD;  Location: [facility];  Service: Orthopedics;  Laterality: Right;  . LEFT HEART CATHETERIZATION WITH CORONARY ANGIOGRAM N/A 01/31/2014   Procedure: LEFT HEART CATHETERIZATION WITH CORONARY ANGIOGRAM;  Surgeon: Iran Ouch, MD;  Location: [facility] CATH LAB;  Service: Cardiovascular;  Laterality: N/A;  . ORIF ANKLE FRACTURE Right 01/26/2017   Procedure: OPEN REDUCTION INTERNAL FIXATION (ORIF) ANKLE FRACTURE;  Surgeon: Sheral Apley, MD;  Location: [facility] OR;  Service: Orthopedics;  Laterality: Right;     Family History  Problem Relation Age of Onset  . Lung cancer Mother   . Colon cancer Other 50     Social History   Socioeconomic History  . Marital status: Single    Spouse name: Not on file  . Number of children: Not on file  . Years of education: Not on file  . Highest education level: Not on file  Occupational History  . Occupation: Unemployed  Tobacco Use  . Smoking status: Never Smoker  . Smokeless tobacco: Never Used  Substance and Sexual Activity  . Alcohol use: Yes    Alcohol/week: 1.0 standard drinks    Types: 1 Cans of beer per week    Comment: occ beer  . Drug use: Yes    Frequency: 7.0 times per week    Types: Marijuana    Comment: 1x/week, smoked day before surgery  .  Sexual activity: Yes    Partners: Male    Comment:  declined condoms 05/2019  Other Topics Concern  . Not on file  Social History Narrative   Lives alone.  Drinks beer daily.  Liquor rarely.  He occasionally smokes marijuana..   Social Determinants of Health   Financial Resource Strain:   . Difficulty of Paying Living Expenses: Not on file  Food Insecurity:   . Worried About Programme researcher, broadcasting/film/video in the Last Year: Not on file  . Ran Out of Food in the Last Year: Not on file  Transportation Needs:   . Lack of Transportation (Medical): Not on file  . Lack  of Transportation (Non-Medical): Not on file  Physical Activity:   . Days of Exercise per Week: Not on file  . Minutes of Exercise per Session: Not on file  Stress:   . Feeling of Stress : Not on file  Social Connections:   . Frequency of Communication with Friends and Family: Not on file  . Frequency of Social Gatherings with Friends and Family: Not on file  . Attends Religious Services: Not on file  . Active Member of Clubs or Organizations: Not on file  . Attends Archivist Meetings: Not on file  . Marital Status: Not on file  Intimate Partner Violence:   . Fear of Current or Ex-Partner: Not on file  . Emotionally Abused: Not on file  . Physically Abused: Not on file  . Sexually Abused: Not on file     BP 116/72   Pulse 77   Ht 5\' 4"  (1.626 m)   Wt 184 lb 9.6 oz (83.7 kg)   SpO2 98%   BMI 31.69 kg/m   Physical Exam:  Well appearing NAD HEENT: Unremarkable Neck:  No JVD, no thyromegally Lymphatics:  No adenopathy Back:  No CVA tenderness Lungs:  Clear with no wheezes HEART:  Regular rate rhythm, no murmurs, no rubs, no clicks Abd:  soft, positive bowel sounds, no organomegally, no rebound, no guarding Ext:  2 plus pulses, no edema, no cyanosis, no clubbing Skin:  No rashes no nodules Neuro:  CN II through XII intact, motor grossly intact  EKG - nsr with biv pacing  DEVICE  Normal device  function.  See PaceArt for details.   Assess/Plan: 1. Chronic systolic heart failure - his symptoms remain class 2. He will continue his current meds.  2. VT - he has maintained NSR on low dose amiodarone  3. ETOH abuse - he has not consumed excess ETOH. He drinks rarely. 4. ICD - his medtronic Biv ICD is working normally.  [name].D.

## 2019-06-29 NOTE — Patient Instructions (Signed)
Medication Instructions:  Your physician recommends that you continue on your current medications as directed. Please refer to the Current Medication list given to you today.  Labwork: None ordered.  Testing/Procedures: None ordered.  Follow-Up: Your physician wants you to follow-up in: one year with Dr. Taylor.   You will receive a reminder letter in the mail two months in advance. If you don't receive a letter, please call our office to schedule the follow-up appointment.  Remote monitoring is used to monitor your ICD from home. This monitoring reduces the number of office visits required to check your device to one time per year. It allows us to keep an eye on the functioning of your device to ensure it is working properly. You are scheduled for a device check from home on 07/18/2019. You may send your transmission at any time that day. If you have a wireless device, the transmission will be sent automatically.   Any Other Special Instructions Will Be Listed Below (If Applicable).  If you need a refill on your cardiac medications before your next appointment, please call your pharmacy.   

## 2019-06-30 ENCOUNTER — Other Ambulatory Visit (HOSPITAL_COMMUNITY): Payer: Self-pay | Admitting: *Deleted

## 2019-06-30 MED ORDER — CARVEDILOL 25 MG PO TABS: 25.0000 mg | ORAL_TABLET | Freq: Two times a day (BID) | ORAL | 3 refills | Status: DC

## 2019-07-04 ENCOUNTER — Other Ambulatory Visit: Payer: Self-pay | Admitting: Internal Medicine

## 2019-07-04 LAB — CUP PACEART INCLINIC DEVICE CHECK
Date Time Interrogation Session: 20210128170650
Implantable Lead Implant Date: 20150126
Implantable Lead Implant Date: 20150126
Implantable Lead Implant Date: 20150126
Implantable Lead Location: 753858
Implantable Lead Location: 753859
Implantable Lead Location: 753860
Implantable Lead Model: 4396
Implantable Lead Model: 5076
Implantable Lead Model: 6935
Implantable Pulse Generator Implant Date: 20150126

## 2019-07-05 ENCOUNTER — Other Ambulatory Visit: Payer: Self-pay

## 2019-07-05 MED ORDER — COLCHICINE 0.6 MG PO TABS: 0.6000 mg | ORAL_TABLET | Freq: Two times a day (BID) | ORAL | 2 refills | Status: DC | PRN

## 2019-07-06 ENCOUNTER — Telehealth: Payer: Self-pay

## 2019-07-06 NOTE — Telephone Encounter (Signed)
Received voicemail from scheduling regarding appointment for patient. Patient states in voicemail he has a knot on right knee and would like to be evaluated by MD. Left voicemail requesting patient call office back. Patient should be okay to follow up with PCP regarding knot. Lorenso Courier, [place]

## 2019-07-18 ENCOUNTER — Ambulatory Visit (INDEPENDENT_AMBULATORY_CARE_PROVIDER_SITE_OTHER): Payer: Medicare HMO | Admitting: *Deleted

## 2019-07-18 DIAGNOSIS — I5022 Chronic systolic (congestive) heart failure: Secondary | ICD-10-CM

## 2019-07-18 LAB — CUP PACEART REMOTE DEVICE CHECK
Battery Remaining Longevity: 16 mo
Battery Voltage: 2.9 V
Brady Statistic AP VP Percent: 0.05 %
Brady Statistic AP VS Percent: 0.02 %
Brady Statistic AS VP Percent: 98.51 %
Brady Statistic AS VS Percent: 1.42 %
Brady Statistic RA Percent Paced: 0.06 %
Brady Statistic RV Percent Paced: 4.86 %
Date Time Interrogation Session: 20210216052725
HighPow Impedance: 78 Ohm
Implantable Lead Implant Date: 20150126
Implantable Lead Implant Date: 20150126
Implantable Lead Implant Date: 20150126
Implantable Lead Location: 753858
Implantable Lead Location: 753859
Implantable Lead Location: 753860
Implantable Lead Model: 4396
Implantable Lead Model: 5076
Implantable Lead Model: 6935
Implantable Pulse Generator Implant Date: 20150126
Lead Channel Impedance Value: 361 Ohm
Lead Channel Impedance Value: 361 Ohm
Lead Channel Impedance Value: 399 Ohm
Lead Channel Impedance Value: 456 Ohm
Lead Channel Impedance Value: 513 Ohm
Lead Channel Impedance Value: 722 Ohm
Lead Channel Pacing Threshold Amplitude: 0.625 V
Lead Channel Pacing Threshold Amplitude: 0.75 V
Lead Channel Pacing Threshold Amplitude: 1.25 V
Lead Channel Pacing Threshold Pulse Width: 0.4 ms
Lead Channel Pacing Threshold Pulse Width: 0.4 ms
Lead Channel Pacing Threshold Pulse Width: 0.4 ms
Lead Channel Sensing Intrinsic Amplitude: 0.875 mV
Lead Channel Sensing Intrinsic Amplitude: 0.875 mV
Lead Channel Sensing Intrinsic Amplitude: 9.375 mV
Lead Channel Sensing Intrinsic Amplitude: 9.375 mV
Lead Channel Setting Pacing Amplitude: 2 V
Lead Channel Setting Pacing Amplitude: 2 V
Lead Channel Setting Pacing Amplitude: 2.25 V
Lead Channel Setting Pacing Pulse Width: 0.4 ms
Lead Channel Setting Pacing Pulse Width: 0.4 ms
Lead Channel Setting Sensing Sensitivity: 0.3 mV

## 2019-07-19 NOTE — Progress Notes (Signed)
ICD Remote  

## 2019-08-03 DIAGNOSIS — L281 Prurigo nodularis: Secondary | ICD-10-CM | POA: Diagnosis not present

## 2019-08-03 DIAGNOSIS — L280 Lichen simplex chronicus: Secondary | ICD-10-CM | POA: Diagnosis not present

## 2019-08-03 DIAGNOSIS — L299 Pruritus, unspecified: Secondary | ICD-10-CM | POA: Diagnosis not present

## 2019-08-11 ENCOUNTER — Other Ambulatory Visit: Payer: Self-pay | Admitting: Internal Medicine

## 2019-08-11 DIAGNOSIS — G4700 Insomnia, unspecified: Secondary | ICD-10-CM

## 2019-09-07 ENCOUNTER — Other Ambulatory Visit (HOSPITAL_COMMUNITY): Payer: Self-pay | Admitting: *Deleted

## 2019-09-07 MED ORDER — SPIRONOLACTONE 25 MG PO TABS: ORAL_TABLET | ORAL | 3 refills | Status: DC

## 2019-10-01 ENCOUNTER — Other Ambulatory Visit (HOSPITAL_COMMUNITY)
Admission: RE | Admit: 2019-10-01 | Discharge: 2019-10-01 | Disposition: A | Discharge status: Home / Self Care | Payer: Medicare Other | Source: Ambulatory Visit | Attending: Internal Medicine | Admitting: Internal Medicine

## 2019-10-01 DIAGNOSIS — Z113 Encounter for screening for infections with a predominantly sexual mode of transmission: Secondary | ICD-10-CM | POA: Insufficient documentation

## 2019-10-01 DIAGNOSIS — Z21 Asymptomatic human immunodeficiency virus [HIV] infection status: Secondary | ICD-10-CM | POA: Insufficient documentation

## 2019-10-04 DIAGNOSIS — M25561 Pain in right knee: Secondary | ICD-10-CM | POA: Diagnosis not present

## 2019-10-04 DIAGNOSIS — M25571 Pain in right ankle and joints of right foot: Secondary | ICD-10-CM | POA: Diagnosis not present

## 2019-10-10 ENCOUNTER — Other Ambulatory Visit (HOSPITAL_COMMUNITY): Payer: Self-pay

## 2019-10-10 MED ORDER — ENTRESTO 97-103 MG PO TABS: 1.0000 | ORAL_TABLET | Freq: Two times a day (BID) | ORAL | 3 refills | Status: DC

## 2019-10-17 ENCOUNTER — Ambulatory Visit (INDEPENDENT_AMBULATORY_CARE_PROVIDER_SITE_OTHER): Payer: Medicare HMO | Admitting: *Deleted

## 2019-10-17 DIAGNOSIS — I472 Ventricular tachycardia, unspecified: Secondary | ICD-10-CM

## 2019-10-17 DIAGNOSIS — I5022 Chronic systolic (congestive) heart failure: Secondary | ICD-10-CM

## 2019-10-18 ENCOUNTER — Telehealth: Payer: Self-pay

## 2019-10-18 DIAGNOSIS — M25571 Pain in right ankle and joints of right foot: Secondary | ICD-10-CM | POA: Diagnosis not present

## 2019-10-18 LAB — CUP PACEART REMOTE DEVICE CHECK
Battery Remaining Longevity: 13 mo
Battery Voltage: 2.88 V
Brady Statistic AP VP Percent: 0.03 %
Brady Statistic AP VS Percent: 0.01 %
Brady Statistic AS VP Percent: 98.66 %
Brady Statistic AS VS Percent: 1.29 %
Brady Statistic RA Percent Paced: 0.05 %
Brady Statistic RV Percent Paced: 5.25 %
Date Time Interrogation Session: 20210519111230
HighPow Impedance: 87 Ohm
Implantable Lead Implant Date: 20150126
Implantable Lead Implant Date: 20150126
Implantable Lead Implant Date: 20150126
Implantable Lead Location: 753858
Implantable Lead Location: 753859
Implantable Lead Location: 753860
Implantable Lead Model: 4396
Implantable Lead Model: 5076
Implantable Lead Model: 6935
Implantable Pulse Generator Implant Date: 20150126
Lead Channel Impedance Value: 418 Ohm
Lead Channel Impedance Value: 418 Ohm
Lead Channel Impedance Value: 475 Ohm
Lead Channel Impedance Value: 513 Ohm
Lead Channel Impedance Value: 608 Ohm
Lead Channel Impedance Value: 931 Ohm
Lead Channel Pacing Threshold Amplitude: 0.625 V
Lead Channel Pacing Threshold Amplitude: 0.75 V
Lead Channel Pacing Threshold Amplitude: 0.875 V
Lead Channel Pacing Threshold Pulse Width: 0.4 ms
Lead Channel Pacing Threshold Pulse Width: 0.4 ms
Lead Channel Pacing Threshold Pulse Width: 0.4 ms
Lead Channel Sensing Intrinsic Amplitude: 0.75 mV
Lead Channel Sensing Intrinsic Amplitude: 0.75 mV
Lead Channel Sensing Intrinsic Amplitude: 9.75 mV
Lead Channel Sensing Intrinsic Amplitude: 9.75 mV
Lead Channel Setting Pacing Amplitude: 2 V
Lead Channel Setting Pacing Amplitude: 2 V
Lead Channel Setting Pacing Amplitude: 2.25 V
Lead Channel Setting Pacing Pulse Width: 0.4 ms
Lead Channel Setting Pacing Pulse Width: 0.4 ms
Lead Channel Setting Sensing Sensitivity: 0.3 mV

## 2019-10-18 NOTE — Telephone Encounter (Signed)
Spoke with patient to remind of missed remote transmission 

## 2019-10-19 NOTE — Progress Notes (Signed)
Remote ICD transmission.   

## 2019-10-31 ENCOUNTER — Other Ambulatory Visit: Payer: Medicare HMO

## 2019-11-01 ENCOUNTER — Other Ambulatory Visit: Payer: Medicare HMO

## 2019-11-01 ENCOUNTER — Other Ambulatory Visit: Payer: Self-pay

## 2019-11-01 DIAGNOSIS — Z113 Encounter for screening for infections with a predominantly sexual mode of transmission: Secondary | ICD-10-CM

## 2019-11-01 DIAGNOSIS — Z21 Asymptomatic human immunodeficiency virus [HIV] infection status: Secondary | ICD-10-CM | POA: Diagnosis present

## 2019-11-01 DIAGNOSIS — Z79899 Other long term (current) drug therapy: Secondary | ICD-10-CM

## 2019-11-02 LAB — URINE CYTOLOGY ANCILLARY ONLY
Chlamydia: NEGATIVE
Comment: NEGATIVE
Comment: NORMAL
Neisseria Gonorrhea: NEGATIVE

## 2019-11-02 LAB — T-HELPER CELL (CD4) - (RCID CLINIC ONLY)
CD4 % Helper T Cell: 31 % — ABNORMAL LOW (ref 33–65)
CD4 T Cell Abs: 805 /uL (ref 400–1790)

## 2019-11-03 LAB — CBC WITH DIFFERENTIAL/PLATELET
Absolute Monocytes: 593 cells/uL (ref 200–950)
Basophils Absolute: 40 cells/uL (ref 0–200)
Basophils Relative: 0.7 %
Eosinophils Absolute: 91 cells/uL (ref 15–500)
Eosinophils Relative: 1.6 %
HCT: 39.3 % (ref 38.5–50.0)
Hemoglobin: 12.8 g/dL — ABNORMAL LOW (ref 13.2–17.1)
Lymphs Abs: 2730 cells/uL (ref 850–3900)
MCH: 30 pg (ref 27.0–33.0)
MCHC: 32.6 g/dL (ref 32.0–36.0)
MCV: 92 fL (ref 80.0–100.0)
MPV: 10.5 fL (ref 7.5–12.5)
Monocytes Relative: 10.4 %
Neutro Abs: 2246 cells/uL (ref 1500–7800)
Neutrophils Relative %: 39.4 %
Platelets: 184 10*3/uL (ref 140–400)
RBC: 4.27 10*6/uL (ref 4.20–5.80)
RDW: 14.4 % (ref 11.0–15.0)
Total Lymphocyte: 47.9 %
WBC: 5.7 10*3/uL (ref 3.8–10.8)

## 2019-11-03 LAB — RPR: RPR Ser Ql: NONREACTIVE

## 2019-11-03 LAB — LIPID PANEL
Cholesterol: 218 mg/dL — ABNORMAL HIGH (ref <200)
HDL: 71 mg/dL (ref >=40)
LDL Cholesterol (Calc): 108 mg/dL (calc) — ABNORMAL HIGH
Non-HDL Cholesterol (Calc): 147 mg/dL (calc) — ABNORMAL HIGH (ref <130)
Total CHOL/HDL Ratio: 3.1 (calc) (ref <5.0)
Triglycerides: 296 mg/dL — ABNORMAL HIGH (ref <150)

## 2019-11-03 LAB — COMPLETE METABOLIC PANEL WITH GFR
AG Ratio: 1.3 (calc) (ref 1.0–2.5)
ALT: 34 U/L (ref 9–46)
AST: 28 U/L (ref 10–35)
Albumin: 4.3 g/dL (ref 3.6–5.1)
Alkaline phosphatase (APISO): 80 U/L (ref 35–144)
BUN: 11 mg/dL (ref 7–25)
CO2: 22 mmol/L (ref 20–32)
Calcium: 9.7 mg/dL (ref 8.6–10.3)
Chloride: 109 mmol/L (ref 98–110)
Creat: 1.12 mg/dL (ref 0.70–1.33)
GFR, Est African American: 85 mL/min/{1.73_m2} (ref >=60)
GFR, Est Non African American: 73 mL/min/{1.73_m2} (ref >=60)
Globulin: 3.2 g/dL (calc) (ref 1.9–3.7)
Glucose, Bld: 98 mg/dL (ref 65–99)
Potassium: 3.7 mmol/L (ref 3.5–5.3)
Sodium: 142 mmol/L (ref 135–146)
Total Bilirubin: 0.4 mg/dL (ref 0.2–1.2)
Total Protein: 7.5 g/dL (ref 6.1–8.1)

## 2019-11-03 LAB — HIV-1 RNA QUANT-NO REFLEX-BLD
HIV 1 RNA Quant: <20 copies/mL
HIV-1 RNA Quant, Log: <1.3 Log copies/mL

## 2019-11-10 ENCOUNTER — Ambulatory Visit: Payer: Medicare HMO | Admitting: Family Medicine

## 2019-11-14 ENCOUNTER — Other Ambulatory Visit: Payer: Self-pay

## 2019-11-14 ENCOUNTER — Encounter: Payer: Self-pay | Admitting: Internal Medicine

## 2019-11-14 ENCOUNTER — Ambulatory Visit (INDEPENDENT_AMBULATORY_CARE_PROVIDER_SITE_OTHER): Payer: Medicare Other | Admitting: Internal Medicine

## 2019-11-14 VITALS — BP 135/94 | HR 87 | Temp 98.3°F | Wt 181.0 lb

## 2019-11-14 DIAGNOSIS — Z113 Encounter for screening for infections with a predominantly sexual mode of transmission: Secondary | ICD-10-CM

## 2019-11-14 DIAGNOSIS — I428 Other cardiomyopathies: Secondary | ICD-10-CM | POA: Diagnosis not present

## 2019-11-14 DIAGNOSIS — Z21 Asymptomatic human immunodeficiency virus [HIV] infection status: Secondary | ICD-10-CM

## 2019-11-14 DIAGNOSIS — K7460 Unspecified cirrhosis of liver: Secondary | ICD-10-CM | POA: Diagnosis not present

## 2019-11-15 ENCOUNTER — Encounter: Payer: Self-pay | Admitting: Internal Medicine

## 2019-11-15 NOTE — Progress Notes (Signed)
   Subjective:    Patient ID: [name], male    DOB: May 10, 1966, 54 y.o.   MRN: [id number]  HPI Here for follow up of HIV He continues on [place] and Tivicay as a salvage regimen with a 184V mutation.  He takes his medication daily with no missed doses.  He has no complaints today.  He missed his appointment with the heart failure team several months ago.  He is having no issues with SOB, leg swelling.  He completed hepatitis C treatment previously.  He gets HCC screening every 6 months.    Review of Systems  Constitutional: Negative for fatigue.  Gastrointestinal: Negative for diarrhea.  Skin: Negative for rash.       Objective:   Physical Exam Constitutional:      Appearance: He is well-developed.  Eyes:     General: No scleral icterus. Cardiovascular:     Rate and Rhythm: Normal rate and regular rhythm.     Heart sounds: Normal heart sounds. No murmur heard.   Pulmonary:     Effort: Pulmonary effort is normal. No respiratory distress.     Breath sounds: Normal breath sounds.  Lymphadenopathy:     Cervical: No cervical adenopathy.  Skin:    Findings: No rash.  Psychiatric:        Mood and Affect: Mood normal.    SH: no tobacco      Assessment & Plan:

## 2019-11-15 NOTE — Assessment & Plan Note (Signed)
I will schedule him for an ultrasound for [facility] screening.

## 2019-11-15 NOTE — Assessment & Plan Note (Signed)
He is going to call the heart failure office to resume follow up.

## 2019-11-15 NOTE — Assessment & Plan Note (Signed)
He is doing well on his salvage regimen and no issues.  He willl continue with his ARVs and rtc in 6 months.

## 2019-11-15 NOTE — Assessment & Plan Note (Signed)
Screened negative 

## 2019-11-20 ENCOUNTER — Ambulatory Visit
Admission: RE | Admit: 2019-11-20 | Discharge: 2019-11-20 | Disposition: A | Discharge status: Home / Self Care | Payer: Medicare Other | Source: Ambulatory Visit | Attending: Internal Medicine | Admitting: Internal Medicine

## 2019-11-20 DIAGNOSIS — Z21 Asymptomatic human immunodeficiency virus [HIV] infection status: Secondary | ICD-10-CM

## 2019-11-20 DIAGNOSIS — K8020 Calculus of gallbladder without cholecystitis without obstruction: Secondary | ICD-10-CM | POA: Diagnosis not present

## 2019-11-20 DIAGNOSIS — K7460 Unspecified cirrhosis of liver: Secondary | ICD-10-CM

## 2019-11-22 ENCOUNTER — Encounter: Payer: Self-pay | Admitting: Family Medicine

## 2019-11-22 ENCOUNTER — Ambulatory Visit (INDEPENDENT_AMBULATORY_CARE_PROVIDER_SITE_OTHER): Payer: Medicare Other | Admitting: Family Medicine

## 2019-11-22 ENCOUNTER — Other Ambulatory Visit: Payer: Self-pay

## 2019-11-22 VITALS — BP 102/72 | HR 86 | Ht 64.0 in | Wt 178.0 lb

## 2019-11-22 DIAGNOSIS — Z0000 Encounter for general adult medical examination without abnormal findings: Secondary | ICD-10-CM | POA: Diagnosis not present

## 2019-11-22 DIAGNOSIS — B182 Chronic viral hepatitis C: Secondary | ICD-10-CM

## 2019-11-22 DIAGNOSIS — I428 Other cardiomyopathies: Secondary | ICD-10-CM

## 2019-11-22 DIAGNOSIS — I10 Essential (primary) hypertension: Secondary | ICD-10-CM

## 2019-11-22 DIAGNOSIS — I5022 Chronic systolic (congestive) heart failure: Secondary | ICD-10-CM | POA: Diagnosis not present

## 2019-11-22 DIAGNOSIS — M25511 Pain in right shoulder: Secondary | ICD-10-CM

## 2019-11-22 DIAGNOSIS — E782 Mixed hyperlipidemia: Secondary | ICD-10-CM

## 2019-11-22 DIAGNOSIS — M25551 Pain in right hip: Secondary | ICD-10-CM

## 2019-11-22 DIAGNOSIS — I4901 Ventricular fibrillation: Secondary | ICD-10-CM

## 2019-11-22 DIAGNOSIS — Z21 Asymptomatic human immunodeficiency virus [HIV] infection status: Secondary | ICD-10-CM

## 2019-11-22 NOTE — Progress Notes (Signed)
SUBJECTIVE:   Chief compliant/HPI: annual examination  [name] is a 54 y.o. who presents today for an annual exam.   History tabs reviewed and updated.  PMH notable for: HIV, h/o systolic CHF with improved EF, NI-cardiomyopathy, h/o v-tach/fib with ICD, HTN, h/o syncope 2/2 ventricular arrhythmia, Chronic Hep C with cirrhosis, GERD, genital herpes, alcohol/marijuana abuse, protein calorie malnutrition, PTSD.  Patient has lost 14lbs.  Acute Concerns: Right hip and right should aches in morning, improves once he gets going in the morning. Denies any pain after that. This has been happening for about three months. Has not tried any OTC medications. Denies any radiculopathy or weakness.   HTN:  BP: 102/72 today. Currently on Hydralazine 50mg  TID (only if >790 systolic), Coreg 25mg  BID, Entresto 97-103mg  BID, and Spironolactone 25mg  qHS. Endorses compliance. He is supposed to hold the Hydral if BP <120, however he notes he was taking the first two doses regardless of BP and holding the third dose when his blood pressures were low. Denies any chest pain, SOB, vision changes, or headaches.   HLD: Last lipid panel below. Not currently on lipid lowering medications.  The 10-year ASCVD risk score Mikey Bussing DC Jr., et al., 2013) is: 7%  Lab Results  Component Value Date   CHOL 218 (H) 11/01/2019   HDL 71 11/01/2019   LDLCALC 108 (H) 11/01/2019   TRIG 296 (H) 11/01/2019   CHOLHDL 3.1 11/01/2019    Social History: Alcohol: 2-3 12-oz beers a day Tobacco:Never smoker  Illicit Drugs: H/o marijuana use, currently not using, stopped for 11 years. No other illicit drugs.  Safe at home: None Depression/Suicidality: None  Family History of GI: None  Review of systems form reviewed and negative unless stated in HPI  OBJECTIVE:   BP 102/72   Pulse 86   Ht 5\' 4"  (1.626 m)   Wt 178 lb (80.7 kg)   SpO2 98%   BMI 30.55 kg/m   General: pleasant older male, sitting comfortably in exam  chair, well nourished, well developed, in no acute distress with non-toxic appearance HEENT: normocephalic, atraumatic, moist mucous membranes, oropharynx without erythema or exudate  Neck: supple, normal ROM CV: regular rate and rhythm without murmurs, rubs, or gallops, no lower extremity edema Lungs: clear to auscultation bilaterally with normal work of breathing Abdomen: soft, non-tender, non-distended, normoactive bowel sounds Skin: warm, dry Extremities: warm and well perfused MSK:  gait normal, see below Neuro: Alert and oriented, speech normal  Right Hip:  - Inspection: No gross deformity, no swelling, erythema, or ecchymosis - Palpation: No TTP, specifically none over greater trochanter, lumbar spine without tenderness - ROM: Normal range of motion on Flexion, extension, abduction, internal and external rotation - Strength: Normal strength. - Neuro/vasc: NV intact distally - Special Tests: Negative FABER and FADIR.  Negative straight leg raise    Right Shoulder: Inspection reveals no obvious deformity, atrophy, or asymmetry. No bruising. No swelling Palpation is normal with no TTP over [facility] joint or bicipital groove. Full ROM in flexion, abduction, internal/external rotation NV intact distally, normal strength   ASSESSMENT/PLAN:   [name] is a healthy 54 y.o. male presenting today for his annual wellness visit.  PMH, surgical history, and social history were reviewed. The following concerns below were discussed.   Chronic systolic heart failure ([place]) Euvolemic on exam. Follows with cardiology. Recommended he schedule follow up at earliest convenience. Continue current medications as prescribed.   Nonischemic cardiomyopathy (HCC) Currently asymptomatic. Patient to  follow up with cardiology.   Paroxysmal ventricular fibrillation (HCC) Patient has a medtronic BIV ICD. Last device check on 10/18/19. Ventricular arrhythmia has been well controlled with amiodarone.  Continued to recommended alcohol cessation. No further syncopal episodes. Home meds: Amiodarone 200mg  QD. Recommended to schedule follow up with cardiology.  HIV (human immunodeficiency virus infection) (HCC) Follows closely with infectious disease. Has completed hepatitis C treatment. Home meds: dolutegravir 50mg  QD, emtricitabine-rilpivir-tenofovir QD, Spironolactone 25mg  QHS. Receives regular HCC screening. STD screening obtained in June 2021 that was negative. Plan to follow up in 6 months. - Last HIV RNA was undetectable - H/o Chronic Hep C s/p treatment. Last Hep C test in 2019 was not detected. - Non-reactive RPR in June 2021 - RUQ U/S negative for [facility] - Emphasized importance of limiting alcohol intake given liver changes from Hepatitis C. Patient voiced understanding and agreement with plan.  Chronic hepatitis C without hepatic coma (HCC) Plan as above.  Primary hypertension Soft blood pressures today. Asymptomatic. Patient has been taking Hydralazine QD despite blood pressures. Recommended patient only take this medication when blood pressures are >120 systollicaly, otherwise hold. He voiced understanding and agreement with plan. Patient to schedule f/u with cardiology at earliest convenience.    Right Shoulder Pain  Right Hip Pain: Early morning stiffness that resolves within 10 minutes. Suspect osteoarthritis. Exam completley benign with no findings to indicate other pathology. Discussed conservative management at this time. RTC if worsening and will re-evaluate and consider imaging at that time. Patient understood and agreed to plan.  - recommend OTC tylenol/ibuprofen PRN, topical analgesics (voltaren gel, capsaicin, salonpaus) - return precautions discussed    Annual Examination  See AVS for age appropriate recommendations.  PHQ-2 score 0, reviewed and discussed.  Blood pressure value is below goal, discussed.   Considered the following screening exams based upon USPSTF  recommendations: Diabetes screening: Monitored via CMP's. NO hyperglycemia noted in last labs in June 2021. Hepatitis B: Follows with ID closely Reviewed risk factors for latent tuberculosis and will hold at this time as he is followed closely with ID who maintains close screening Colorectal cancer screening: up to date on screening for CRC.  Follow up in 1 year or sooner if indicated.    July 2021, DO [facility], PGY2 11/26/2019 7:55 PM

## 2019-11-22 NOTE — Assessment & Plan Note (Deleted)
Last echo (12/2018): EF 55-60%, impaired LV relaxation Class II HF based on symptoms  Home meds: Lasix PRN, Hydralazine, Coreg, Entresto, Spironolactone

## 2019-11-22 NOTE — Patient Instructions (Signed)
Thank you for coming to see me today. It was a pleasure to see you.   Hip/Shoulder Pain: Treatment - you should: try over the counter tylenol or ibuprofen as needed, Voltaren gel as needed, capsaicin cream, Salonpaus patches  Call us or go to the ER if you have high fever, a joint is very red and swollen. Come back to see [place] if worsening or more persistent and we can get imaging at that time  Please monitor your blood pressure and do not take the Hydralazine if the top number of your blood pressure is <120.  Please schedule a follow up appointment with your cardiologist for follow up.   Please follow-up with me in 1 year for annual exam, sooner if other concerns.  If you have any questions or concerns, please do not hesitate to call the office at [phone].  Take Care,  Dr. Orpah Cobb, DO Resident Physician [facility] [phone]

## 2019-11-26 DIAGNOSIS — E782 Mixed hyperlipidemia: Secondary | ICD-10-CM | POA: Insufficient documentation

## 2019-11-26 NOTE — Assessment & Plan Note (Signed)
Euvolemic on exam. Follows with cardiology. Recommended he schedule follow up at earliest convenience. Continue current medications as prescribed.

## 2019-11-26 NOTE — Assessment & Plan Note (Signed)
Soft blood pressures today. Asymptomatic. Patient has been taking Hydralazine QD despite blood pressures. Recommended patient only take this medication when blood pressures are >120 systollicaly, otherwise hold. He voiced understanding and agreement with plan. Patient to schedule f/u with cardiology at earliest convenience.

## 2019-11-26 NOTE — Assessment & Plan Note (Signed)
Currently asymptomatic. Patient to follow up with cardiology.

## 2019-11-26 NOTE — Assessment & Plan Note (Signed)
Plan as above.  

## 2019-11-26 NOTE — Assessment & Plan Note (Signed)
Follows closely with infectious disease. Has completed hepatitis C treatment. Home meds: dolutegravir 50mg  QD, emtricitabine-rilpivir-tenofovir QD, Spironolactone 25mg  QHS. Receives regular HCC screening. STD screening obtained in June 2021 that was negative. Plan to follow up in 6 months. - Last HIV RNA was undetectable - H/o Chronic Hep C s/p treatment. Last Hep C test in 2019 was not detected. - Non-reactive RPR in June 2021 - RUQ U/S negative for [facility] - Emphasized importance of limiting alcohol intake given liver changes from Hepatitis C. Patient voiced understanding and agreement with plan.

## 2019-11-26 NOTE — Assessment & Plan Note (Signed)
Patient has a medtronic BIV ICD. Last device check on 10/18/19. Ventricular arrhythmia has been well controlled with amiodarone. Continued to recommended alcohol cessation. No further syncopal episodes. Home meds: Amiodarone 200mg  QD. Recommended to schedule follow up with cardiology.

## 2019-11-26 NOTE — Assessment & Plan Note (Signed)
Elevated LDL. The 10-year ASCVD risk score Denman George DC Jr., et al., 2013) is: 7% - continue to monitor yearly - congratulated patient on weight loss and recommended to continue life style modifications

## 2020-01-02 ENCOUNTER — Other Ambulatory Visit (HOSPITAL_COMMUNITY): Payer: Self-pay | Admitting: Internal Medicine

## 2020-01-16 ENCOUNTER — Other Ambulatory Visit: Payer: Self-pay | Admitting: Internal Medicine

## 2020-01-16 ENCOUNTER — Ambulatory Visit (INDEPENDENT_AMBULATORY_CARE_PROVIDER_SITE_OTHER): Payer: Medicare Other | Admitting: *Deleted

## 2020-01-16 DIAGNOSIS — I428 Other cardiomyopathies: Secondary | ICD-10-CM

## 2020-01-17 LAB — CUP PACEART REMOTE DEVICE CHECK
Battery Remaining Longevity: 9 mo
Battery Voltage: 2.86 V
Brady Statistic AP VP Percent: 0.04 %
Brady Statistic AP VS Percent: 0.01 %
Brady Statistic AS VP Percent: 98.65 %
Brady Statistic AS VS Percent: 1.3 %
Brady Statistic RA Percent Paced: 0.05 %
Brady Statistic RV Percent Paced: 5.88 %
Date Time Interrogation Session: 20210817042406
HighPow Impedance: 69 Ohm
Implantable Lead Implant Date: 20150126
Implantable Lead Implant Date: 20150126
Implantable Lead Implant Date: 20150126
Implantable Lead Location: 753858
Implantable Lead Location: 753859
Implantable Lead Location: 753860
Implantable Lead Model: 4396
Implantable Lead Model: 5076
Implantable Lead Model: 6935
Implantable Pulse Generator Implant Date: 20150126
Lead Channel Impedance Value: 342 Ohm
Lead Channel Impedance Value: 418 Ohm
Lead Channel Impedance Value: 418 Ohm
Lead Channel Impedance Value: 418 Ohm
Lead Channel Impedance Value: 513 Ohm
Lead Channel Impedance Value: 817 Ohm
Lead Channel Pacing Threshold Amplitude: 0.625 V
Lead Channel Pacing Threshold Amplitude: 0.75 V
Lead Channel Pacing Threshold Amplitude: 1.25 V
Lead Channel Pacing Threshold Pulse Width: 0.4 ms
Lead Channel Pacing Threshold Pulse Width: 0.4 ms
Lead Channel Pacing Threshold Pulse Width: 0.4 ms
Lead Channel Sensing Intrinsic Amplitude: 0.875 mV
Lead Channel Sensing Intrinsic Amplitude: 0.875 mV
Lead Channel Sensing Intrinsic Amplitude: 11 mV
Lead Channel Sensing Intrinsic Amplitude: 11 mV
Lead Channel Setting Pacing Amplitude: 2 V
Lead Channel Setting Pacing Amplitude: 2 V
Lead Channel Setting Pacing Amplitude: 2.25 V
Lead Channel Setting Pacing Pulse Width: 0.4 ms
Lead Channel Setting Pacing Pulse Width: 0.4 ms
Lead Channel Setting Sensing Sensitivity: 0.3 mV

## 2020-01-18 ENCOUNTER — Telehealth: Payer: Self-pay

## 2020-01-18 NOTE — Telephone Encounter (Signed)
I let the pt know he do not have to send the transmission manually. His monitor is programmed to send automatically between the hours 12 am-5 am.

## 2020-01-18 NOTE — Progress Notes (Signed)
Remote ICD transmission.   

## 2020-02-07 ENCOUNTER — Other Ambulatory Visit: Payer: Self-pay | Admitting: Internal Medicine

## 2020-02-07 DIAGNOSIS — G4700 Insomnia, unspecified: Secondary | ICD-10-CM

## 2020-02-08 ENCOUNTER — Other Ambulatory Visit: Payer: Self-pay | Admitting: Internal Medicine

## 2020-02-08 ENCOUNTER — Other Ambulatory Visit (HOSPITAL_COMMUNITY): Payer: Self-pay | Admitting: Internal Medicine

## 2020-02-08 DIAGNOSIS — G4700 Insomnia, unspecified: Secondary | ICD-10-CM

## 2020-02-09 ENCOUNTER — Other Ambulatory Visit (HOSPITAL_COMMUNITY): Payer: Self-pay | Admitting: Internal Medicine

## 2020-03-13 ENCOUNTER — Ambulatory Visit (INDEPENDENT_AMBULATORY_CARE_PROVIDER_SITE_OTHER): Payer: Medicare Other

## 2020-03-13 ENCOUNTER — Encounter (HOSPITAL_COMMUNITY): Payer: Self-pay

## 2020-03-13 ENCOUNTER — Other Ambulatory Visit: Payer: Self-pay

## 2020-03-13 ENCOUNTER — Ambulatory Visit (HOSPITAL_COMMUNITY)
Admission: RE | Admit: 2020-03-13 | Discharge: 2020-03-13 | Disposition: A | Discharge status: Home / Self Care | Payer: Medicare Other | Source: Ambulatory Visit | Attending: Cardiology | Admitting: Cardiology

## 2020-03-13 VITALS — BP 125/85 | HR 73 | Wt 181.6 lb

## 2020-03-13 DIAGNOSIS — I472 Ventricular tachycardia: Secondary | ICD-10-CM | POA: Insufficient documentation

## 2020-03-13 DIAGNOSIS — Z950 Presence of cardiac pacemaker: Secondary | ICD-10-CM | POA: Insufficient documentation

## 2020-03-13 DIAGNOSIS — Z23 Encounter for immunization: Secondary | ICD-10-CM | POA: Diagnosis not present

## 2020-03-13 DIAGNOSIS — Z21 Asymptomatic human immunodeficiency virus [HIV] infection status: Secondary | ICD-10-CM | POA: Insufficient documentation

## 2020-03-13 DIAGNOSIS — Z791 Long term (current) use of non-steroidal anti-inflammatories (NSAID): Secondary | ICD-10-CM | POA: Insufficient documentation

## 2020-03-13 DIAGNOSIS — R55 Syncope and collapse: Secondary | ICD-10-CM | POA: Diagnosis not present

## 2020-03-13 DIAGNOSIS — F1010 Alcohol abuse, uncomplicated: Secondary | ICD-10-CM | POA: Insufficient documentation

## 2020-03-13 DIAGNOSIS — I110 Hypertensive heart disease with heart failure: Secondary | ICD-10-CM | POA: Diagnosis not present

## 2020-03-13 DIAGNOSIS — Z79899 Other long term (current) drug therapy: Secondary | ICD-10-CM

## 2020-03-13 DIAGNOSIS — S82891D Other fracture of right lower leg, subsequent encounter for closed fracture with routine healing: Secondary | ICD-10-CM | POA: Insufficient documentation

## 2020-03-13 DIAGNOSIS — I5082 Biventricular heart failure: Secondary | ICD-10-CM | POA: Insufficient documentation

## 2020-03-13 DIAGNOSIS — I428 Other cardiomyopathies: Secondary | ICD-10-CM | POA: Diagnosis not present

## 2020-03-13 DIAGNOSIS — F329 Major depressive disorder, single episode, unspecified: Secondary | ICD-10-CM | POA: Diagnosis not present

## 2020-03-13 DIAGNOSIS — I5022 Chronic systolic (congestive) heart failure: Secondary | ICD-10-CM

## 2020-03-13 LAB — COMPREHENSIVE METABOLIC PANEL
ALT: 42 U/L (ref 0–44)
AST: 58 U/L — ABNORMAL HIGH (ref 15–41)
Albumin: 4.1 g/dL (ref 3.5–5.0)
Alkaline Phosphatase: 105 U/L (ref 38–126)
Anion gap: 13 (ref 5–15)
BUN: 15 mg/dL (ref 6–20)
CO2: 22 mmol/L (ref 22–32)
Calcium: 9.8 mg/dL (ref 8.9–10.3)
Chloride: 106 mmol/L (ref 98–111)
Creatinine, Ser: 1.2 mg/dL (ref 0.61–1.24)
GFR, Estimated: >60 mL/min (ref >60)
Glucose, Bld: 116 mg/dL — ABNORMAL HIGH (ref 70–99)
Potassium: 3.7 mmol/L (ref 3.5–5.1)
Sodium: 141 mmol/L (ref 135–145)
Total Bilirubin: 0.4 mg/dL (ref 0.3–1.2)
Total Protein: 8 g/dL (ref 6.5–8.1)

## 2020-03-13 LAB — TSH: TSH: 5.509 u[IU]/mL — ABNORMAL HIGH (ref 0.350–4.500)

## 2020-03-13 NOTE — Patient Instructions (Signed)
It was great to see you today! No medication changes are needed at this time.  Labs today We will only contact you if something comes back abnormal or we need to make some changes. Otherwise no news is good news!  You have been referred to Ophthalmology for a routine eye exam -please call for an appointment   Your physician has requested that you have an echocardiogram. Echocardiography is a painless test that uses sound waves to create images of your heart. It provides your doctor with information about the size and shape of your heart and how well your heart's chambers and valves are working. This procedure takes approximately one hour. There are no restrictions for this procedure.   Your physician recommends that you schedule a follow-up appointment in: 3 months with Dr [name]   If you have any questions or concerns before your next appointment please send [place] a message through [place] or call our office at [phone].    TO LEAVE A MESSAGE FOR THE NURSE SELECT OPTION 2, PLEASE LEAVE A MESSAGE INCLUDING: . YOUR NAME . DATE OF BIRTH . CALL BACK NUMBER . REASON FOR CALL**this is important as we prioritize the call backs  YOU WILL RECEIVE A CALL BACK THE SAME DAY AS LONG AS YOU CALL BEFORE 4:00 PM

## 2020-03-13 NOTE — Progress Notes (Signed)
Patient ID: [name], male   DOB: Feb 15, 1966, 54 y.o.   MRN: [id number]   [facility]  Patient ID: [name], male   DOB: May 28, 1966, 54 y.o.   MRN: [id number]   Primary Physician: [name], MD  Primary Cardiologist: Dr Ladona Ridgel  Primary HF: Dr. [name]    HPI: [name] is a 54 y.o. male with a history of HIV 1998, HCV, ETOH abuse and CHF due to NICM s/p Medtronic  CRT-D (06/2013). He also has a h/o syncope and previously had an EP study which was non-inducible for VT.    Was admitted 01/19/13- 01/22/13 for acute on chronic systolic HF. ECHO EF 20-25%. He was diuresed with IV lasix. He was discharged on coreg 6.25 mg BID, digoxin 0.25 mg daily, lisinopril 40 mg daily, and spironolactone 25 mg daily. Discharge weight 150 lbs.   Had repeat Echo 03/2015 showing improved EF back to normal range at 55%.  Admitted 8/27-8/31/18 for syncope from low BP resulting in trimalleolar fracture of the right ankle and left proximal tibia. He underwent ORIF of right trimalleolar fracture. HF meds and diuretics adjusted. Echo repeated, EF 65-70%. He was discharged to SNF.   He has not been seen in over a year. Last clinic visit was 12/2018. Was doing ok at the time w/o significant HF symptoms and no VT but was still drinking 6-8 beers/day. He reported stable home wt between 190-196 lb and was euvolemic on exam w/ NYHA Class II symptoms. No med changes were made. He was instructed to return for repeat echo but failed to do so.   He returns today for f/u. His wt is down 10 lb since last year, 191>>181 lb. BP well controlled, denies further orthostasis. No syncope/ near syncope. He is not grossly fluid overloaded. Device interrogation shows stable impedence/ fluid index. No VT. Denies dyspnea, orthopnea/PND. No CP. He continues to drink beer daily, 2-3 12 oz beers. He is followed closely by ID. Has f/u w/ Dr. Luciana Axe today.     ECHO 04/2013: EF 20-25%, mod/severe MR, LA mod  dilated ECHO 10/16/13 EF 20-25%  ECHO 03/14/15 EF 55% ECHO 12/17 EF 55% ECHO 8/18 EF 65-70%.   SH: Lives with partner in [place], drinking a 2-3 12 oz beers daily.  Review of systems complete and found to be negative unless listed in HPI.    Family History  Problem Relation Age of Onset  . Lung cancer Mother   . Colon cancer Other 50   Past Medical History:  Diagnosis Date  . CHF (congestive heart failure) (HCC)   . Chronic systolic heart failure (HCC)    a. Echo 12/05/11:  EF 20-25%, diff HK with mild sparing of IL wall, mild AI, mod MR, mild LAE, mild RVE, mild reduced RVF.;  b.  Echo 05/11/2012:  Mild LVH, EF 20-25%, Gr 1 diast dysfn, mod MR, mild LAE  . Depression   . G6PD deficiency   . GERD (gastroesophageal reflux disease)   . Hepatitis    Hep B and Hep C (patient does not report this but these are listed in previous notes.)  . HIV infection (HCC)   . Hypertension   . NICM (nonischemic cardiomyopathy) (HCC)    cardia CTA 8/13 negative for obstructive CAD  . Presence of permanent cardiac pacemaker   . VT (ventricular tachycardia) (HCC)     Current Outpatient Medications  Medication Sig Dispense Refill  . amiodarone (PACERONE) 200 MG tablet TAKE 1  TABLET BY MOUTH EVERY DAY 90 tablet 1  . carvedilol (COREG) 25 MG tablet Take 1 tablet (25 mg total) by mouth 2 (two) times daily with a meal. 180 tablet 3  . clobetasol ointment (TEMOVATE) 0.05 % Apply 1 application topically 2 (two) times daily.  0  . colchicine 0.6 MG tablet TAKE 1 TABLET(0.6 MG) BY MOUTH TWICE DAILY AS NEEDED FOR GOUT FLARES 30 tablet 2  . diclofenac sodium (VOLTAREN) 1 % GEL Apply 2 g topically 4 (four) times daily. 100 g 11  . dolutegravir (TIVICAY) 50 MG tablet 1 tab daily 30 tablet 11  . doxepin (SINEQUAN) 25 MG capsule Take 1 capsule by mouth as needed.  5  . emtricitabine-rilpivir-tenofovir AF (ODEFSEY) 200-25-25 MG TABS tablet Take 1 tablet by mouth daily. 30 tablet 11  . ENTRESTO 97-103 MG TAKE 1  TABLET BY MOUTH TWICE DAILY 60 tablet 3  . furosemide (LASIX) 40 MG tablet TAKE 1 TABLET BY MOUTH EVERY DAY AS NEEDED 90 tablet 1  . gabapentin (NEURONTIN) 300 MG capsule Take 300 mg by mouth 3 (three) times daily.    . hydrALAZINE (APRESOLINE) 50 MG tablet Take 1 tablet (50 mg total) by mouth 3 (three) times daily. Do not take if your systolic blood pressure is less that 120. 120 tablet 11  . hydrOXYzine (ATARAX/VISTARIL) 50 MG tablet take 1 tablet by mouth three times a day if needed 15 tablet 1  . mirtazapine (REMERON) 7.5 MG tablet TAKE 1 TABLET(7.5 MG) BY MOUTH AT BEDTIME 30 tablet 1  . ondansetron (ZOFRAN) 4 MG tablet Take 4 mg by mouth as needed.  0  . spironolactone (ALDACTONE) 25 MG tablet TAKE 1 TABLET(25 MG) BY MOUTH AT BEDTIME Please call for office visit [phone] 30 tablet 3  . triamcinolone cream (KENALOG) 0.1 % Apply 1 application topically 3 (three) times daily.  0  . valACYclovir (VALTREX) 1000 MG tablet Take 1 tablet (1,000 mg total) by mouth 2 (two) times daily as needed (outbreaks). Take for 5 days for an outbreak 10 tablet 6   No current facility-administered medications for this encounter.    Vitals:   03/13/20 0948  BP: 125/85  Pulse: 73  SpO2: 98%  Weight: 82.4 kg (181 lb 9.6 oz)   Wt Readings from Last 3 Encounters:  03/13/20 82.4 kg (181 lb 9.6 oz)  11/22/19 80.7 kg (178 lb)  11/14/19 82.1 kg (181 lb)   PHYSICAL EXAM: General:  Well appearing. No respiratory difficulty HEENT: normal Neck: supple. no JVD. Carotids 2+ bilat; no bruits. No lymphadenopathy or thyromegaly appreciated. Cor: PMI nondisplaced. Regular rate & rhythm. No rubs, gallops or murmurs. Lungs: clear Abdomen: obese, soft, nontender, nondistended. No hepatosplenomegaly. No bruits or masses. Good bowel sounds. Extremities: no cyanosis, clubbing, rash, edema Neuro: alert & oriented x 3, cranial nerves grossly intact. moves all 4 extremities w/o difficulty. Affect pleasant.   ASSESSMENT &  PLAN:  1) HTN - well controlled on current regimen  - check CMP today   2) Chronic systolic HF; now with recovered EF: Nonischemic cardiomyopathy, ?due to ETOH abuse. EF 20-25% in 2014. EF 55% (03/2015) s/p Medtronic CRT-D (06/2013). 12/2016 ECHO EF 65-70%.  - NYHA Class II, Euvolemic on exam and device interrogation. No VT - he continues to drink beer, 2-3 12 oz/day. Cessation advised.  - repeat echo to reassess LVEF  - Continue Coreg 25 mg twice a day.   - Continue spiro to 25 mg daily.  - Continue Entresto 97/103 mg  BID. - Check CMP today.   3) Ventricular tachycardia:  - Has ICD, followed Dr. Ladona Ridgel  - No VT on interrogation.  - Continue Amio 200 mg daily.  - Check TSH and HFT today. Discussed the need to yearly eye exams. Will place referral to opthalmology   4) HIV - Stable.  - Followed by Dr Luciana Axe. Has appt today   5) ETOH abuse - continues to drink 2-3 12 oz beers/day  - Discussed importance of alcohol cessation.   Plan repeat echo. F/u w/ Dr. [name] in 3 months     Robbie Lis, PA-C  03/13/2020 10:10 AM

## 2020-03-19 ENCOUNTER — Ambulatory Visit (HOSPITAL_COMMUNITY)
Admission: RE | Admit: 2020-03-19 | Discharge: 2020-03-19 | Disposition: A | Discharge status: Home / Self Care | Payer: Medicare Other | Source: Ambulatory Visit | Attending: Cardiology | Admitting: Cardiology

## 2020-03-19 ENCOUNTER — Other Ambulatory Visit: Payer: Self-pay

## 2020-03-19 DIAGNOSIS — I313 Pericardial effusion (noninflammatory): Secondary | ICD-10-CM | POA: Insufficient documentation

## 2020-03-19 DIAGNOSIS — Z95810 Presence of automatic (implantable) cardiac defibrillator: Secondary | ICD-10-CM | POA: Insufficient documentation

## 2020-03-19 DIAGNOSIS — I428 Other cardiomyopathies: Secondary | ICD-10-CM | POA: Insufficient documentation

## 2020-03-19 DIAGNOSIS — I5022 Chronic systolic (congestive) heart failure: Secondary | ICD-10-CM | POA: Diagnosis not present

## 2020-03-19 DIAGNOSIS — I110 Hypertensive heart disease with heart failure: Secondary | ICD-10-CM | POA: Diagnosis not present

## 2020-03-19 LAB — ECHOCARDIOGRAM COMPLETE
Area-P 1/2: 1.39 cm2
S' Lateral: 3.1 cm

## 2020-03-19 NOTE — Progress Notes (Signed)
  Echocardiogram 2D Echocardiogram has been performed.  [name] 03/19/2020, 1:53 PM

## 2020-03-20 ENCOUNTER — Telehealth (HOSPITAL_COMMUNITY): Payer: Self-pay | Admitting: Cardiology

## 2020-03-20 DIAGNOSIS — R7989 Other specified abnormal findings of blood chemistry: Secondary | ICD-10-CM

## 2020-03-20 NOTE — Telephone Encounter (Signed)
-----   Message from Allayne Butcher, [place] sent at 03/14/2020  5:30 PM EDT ----- TSH mildly elevated on amio. Repeat TSH and check Free T3/T4

## 2020-03-20 NOTE — Telephone Encounter (Signed)
Pt aware and voiced understanding\repeat labs 11/3

## 2020-04-03 ENCOUNTER — Ambulatory Visit (HOSPITAL_COMMUNITY)
Admission: RE | Admit: 2020-04-03 | Discharge: 2020-04-03 | Disposition: A | Discharge status: Home / Self Care | Payer: Medicare Other | Source: Ambulatory Visit | Attending: Cardiology | Admitting: Cardiology

## 2020-04-03 ENCOUNTER — Other Ambulatory Visit: Payer: Self-pay

## 2020-04-03 DIAGNOSIS — R7989 Other specified abnormal findings of blood chemistry: Secondary | ICD-10-CM

## 2020-04-03 DIAGNOSIS — R946 Abnormal results of thyroid function studies: Secondary | ICD-10-CM | POA: Diagnosis not present

## 2020-04-03 LAB — TSH: TSH: 2.784 u[IU]/mL (ref 0.350–4.500)

## 2020-04-03 LAB — T4, FREE: Free T4: 1.14 ng/dL — ABNORMAL HIGH (ref 0.61–1.12)

## 2020-04-04 LAB — T3, FREE: T3, Free: 2.3 pg/mL (ref 2.0–4.4)

## 2020-04-16 ENCOUNTER — Telehealth: Payer: Self-pay

## 2020-04-16 ENCOUNTER — Ambulatory Visit (INDEPENDENT_AMBULATORY_CARE_PROVIDER_SITE_OTHER): Payer: Medicare Other

## 2020-04-16 DIAGNOSIS — I428 Other cardiomyopathies: Secondary | ICD-10-CM | POA: Diagnosis not present

## 2020-04-16 LAB — CUP PACEART REMOTE DEVICE CHECK
Battery Remaining Longevity: 7 mo
Battery Voltage: 2.84 V
Brady Statistic AP VP Percent: 0.04 %
Brady Statistic AP VS Percent: 0.02 %
Brady Statistic AS VP Percent: 98.65 %
Brady Statistic AS VS Percent: 1.29 %
Brady Statistic RA Percent Paced: 0.06 %
Brady Statistic RV Percent Paced: 3.56 %
Date Time Interrogation Session: 20211116131333
HighPow Impedance: 82 Ohm
Implantable Lead Implant Date: 20150126
Implantable Lead Implant Date: 20150126
Implantable Lead Implant Date: 20150126
Implantable Lead Location: 753858
Implantable Lead Location: 753859
Implantable Lead Location: 753860
Implantable Lead Model: 4396
Implantable Lead Model: 5076
Implantable Lead Model: 6935
Implantable Pulse Generator Implant Date: 20150126
Lead Channel Impedance Value: 342 Ohm
Lead Channel Impedance Value: 399 Ohm
Lead Channel Impedance Value: 399 Ohm
Lead Channel Impedance Value: 513 Ohm
Lead Channel Impedance Value: 646 Ohm
Lead Channel Impedance Value: 950 Ohm
Lead Channel Pacing Threshold Amplitude: 0.625 V
Lead Channel Pacing Threshold Amplitude: 0.75 V
Lead Channel Pacing Threshold Amplitude: 1.5 V
Lead Channel Pacing Threshold Pulse Width: 0.4 ms
Lead Channel Pacing Threshold Pulse Width: 0.4 ms
Lead Channel Pacing Threshold Pulse Width: 0.4 ms
Lead Channel Sensing Intrinsic Amplitude: 1.5 mV
Lead Channel Sensing Intrinsic Amplitude: 1.5 mV
Lead Channel Sensing Intrinsic Amplitude: 7.5 mV
Lead Channel Sensing Intrinsic Amplitude: 7.5 mV
Lead Channel Setting Pacing Amplitude: 2 V
Lead Channel Setting Pacing Amplitude: 2 V
Lead Channel Setting Pacing Amplitude: 2.5 V
Lead Channel Setting Pacing Pulse Width: 0.4 ms
Lead Channel Setting Pacing Pulse Width: 0.4 ms
Lead Channel Setting Sensing Sensitivity: 0.3 mV

## 2020-04-16 NOTE — Telephone Encounter (Signed)
Scheduled transmission received, device is 7 months until ERI.    Spoke with pt advised of increased frequency in monitoring to monthly.  Next scheduled check 05/17/20.

## 2020-04-17 ENCOUNTER — Other Ambulatory Visit: Payer: Self-pay

## 2020-04-17 ENCOUNTER — Other Ambulatory Visit: Payer: Medicare Other

## 2020-04-17 DIAGNOSIS — Z21 Asymptomatic human immunodeficiency virus [HIV] infection status: Secondary | ICD-10-CM

## 2020-04-17 DIAGNOSIS — M25571 Pain in right ankle and joints of right foot: Secondary | ICD-10-CM | POA: Diagnosis not present

## 2020-04-18 LAB — T-HELPER CELL (CD4) - (RCID CLINIC ONLY)
CD4 % Helper T Cell: 30 % — ABNORMAL LOW (ref 33–65)
CD4 T Cell Abs: 538 /uL (ref 400–1790)

## 2020-04-18 NOTE — Progress Notes (Signed)
Remote ICD transmission.   

## 2020-04-19 LAB — HIV-1 RNA QUANT-NO REFLEX-BLD
HIV 1 RNA Quant: <20 Copies/mL
HIV-1 RNA Quant, Log: <1.3 Log cps/mL

## 2020-05-01 ENCOUNTER — Ambulatory Visit (INDEPENDENT_AMBULATORY_CARE_PROVIDER_SITE_OTHER): Payer: Medicare Other | Admitting: Internal Medicine

## 2020-05-01 ENCOUNTER — Other Ambulatory Visit: Payer: Self-pay

## 2020-05-01 ENCOUNTER — Encounter: Payer: Self-pay | Admitting: Internal Medicine

## 2020-05-01 VITALS — BP 123/88 | HR 77 | Temp 98.0°F | Resp 16 | Ht 64.0 in | Wt 180.0 lb

## 2020-05-01 DIAGNOSIS — Z21 Asymptomatic human immunodeficiency virus [HIV] infection status: Secondary | ICD-10-CM

## 2020-05-01 DIAGNOSIS — F1010 Alcohol abuse, uncomplicated: Secondary | ICD-10-CM

## 2020-05-01 DIAGNOSIS — B20 Human immunodeficiency virus [HIV] disease: Secondary | ICD-10-CM | POA: Diagnosis not present

## 2020-05-01 DIAGNOSIS — Z113 Encounter for screening for infections with a predominantly sexual mode of transmission: Secondary | ICD-10-CM | POA: Insufficient documentation

## 2020-05-01 DIAGNOSIS — B182 Chronic viral hepatitis C: Secondary | ICD-10-CM | POA: Diagnosis not present

## 2020-05-01 DIAGNOSIS — K7460 Unspecified cirrhosis of liver: Secondary | ICD-10-CM

## 2020-05-01 MED ORDER — TIVICAY 50 MG PO TABS: ORAL_TABLET | ORAL | 11 refills | Status: DC

## 2020-05-01 MED ORDER — ODEFSEY 200-25-25 MG PO TABS
1.0000 | ORAL_TABLET | Freq: Every day | ORAL | 11 refills | Status: DC
Start: 2020-05-01 — End: 2021-05-15

## 2020-05-01 MED ORDER — COLCHICINE 0.6 MG PO TABS
ORAL_TABLET | ORAL | 6 refills | Status: DC
Start: 2020-05-01 — End: 2021-03-30

## 2020-05-01 NOTE — Progress Notes (Signed)
   Subjective:    Patient ID: [name], male    DOB: 1966/05/04, 54 y.o.   MRN: [id number]  HPI Here for follow up of HIV He continues on Odefsey and Tivicay with a history of a 184V mutation.  No missed doses and feels well.  CD4 538, viral load < 20.     Review of Systems  Constitutional: Negative for fatigue.  Gastrointestinal: Negative for diarrhea.  Skin: Negative for rash.       Objective:   Physical Exam Constitutional:      Appearance: He is well-developed.  Eyes:     General: No scleral icterus. Pulmonary:     Effort: Pulmonary effort is normal. No respiratory distress.  Skin:    Findings: No rash.  Neurological:     General: No focal deficit present.  Psychiatric:        Mood and Affect: Mood normal.    SH: no tobacco      Assessment & Plan:

## 2020-05-01 NOTE — Assessment & Plan Note (Signed)
Will schedule for HCC screening 

## 2020-05-01 NOTE — Assessment & Plan Note (Signed)
Discussed need to quit drinking.

## 2020-05-01 NOTE — Assessment & Plan Note (Signed)
He continues to do well with his salvage regimen and no concerns.  No changes and rtc in 6 months.

## 2020-05-02 ENCOUNTER — Other Ambulatory Visit (HOSPITAL_COMMUNITY): Payer: Self-pay | Admitting: Internal Medicine

## 2020-05-09 ENCOUNTER — Ambulatory Visit
Admission: RE | Admit: 2020-05-09 | Discharge: 2020-05-09 | Disposition: A | Discharge status: Home / Self Care | Payer: Medicare Other | Source: Ambulatory Visit | Attending: Internal Medicine | Admitting: Internal Medicine

## 2020-05-09 DIAGNOSIS — Z1289 Encounter for screening for malignant neoplasm of other sites: Secondary | ICD-10-CM | POA: Diagnosis not present

## 2020-05-09 DIAGNOSIS — K7460 Unspecified cirrhosis of liver: Secondary | ICD-10-CM

## 2020-05-09 DIAGNOSIS — K8020 Calculus of gallbladder without cholecystitis without obstruction: Secondary | ICD-10-CM | POA: Diagnosis not present

## 2020-05-17 ENCOUNTER — Ambulatory Visit (INDEPENDENT_AMBULATORY_CARE_PROVIDER_SITE_OTHER): Payer: Medicare Other

## 2020-05-17 DIAGNOSIS — I428 Other cardiomyopathies: Secondary | ICD-10-CM

## 2020-05-20 LAB — CUP PACEART REMOTE DEVICE CHECK
Battery Remaining Longevity: 7 mo
Battery Voltage: 2.81 V
Brady Statistic AP VP Percent: 0.11 %
Brady Statistic AP VS Percent: 0.02 %
Brady Statistic AS VP Percent: 98.53 %
Brady Statistic AS VS Percent: 1.34 %
Brady Statistic RA Percent Paced: 0.13 %
Brady Statistic RV Percent Paced: 2.62 %
Date Time Interrogation Session: 20211219042043
HighPow Impedance: 85 Ohm
Implantable Lead Implant Date: 20150126
Implantable Lead Implant Date: 20150126
Implantable Lead Implant Date: 20150126
Implantable Lead Location: 753858
Implantable Lead Location: 753859
Implantable Lead Location: 753860
Implantable Lead Model: 4396
Implantable Lead Model: 5076
Implantable Lead Model: 6935
Implantable Pulse Generator Implant Date: 20150126
Lead Channel Impedance Value: 399 Ohm
Lead Channel Impedance Value: 418 Ohm
Lead Channel Impedance Value: 418 Ohm
Lead Channel Impedance Value: 456 Ohm
Lead Channel Impedance Value: 665 Ohm
Lead Channel Impedance Value: 950 Ohm
Lead Channel Pacing Threshold Amplitude: 0.5 V
Lead Channel Pacing Threshold Amplitude: 0.75 V
Lead Channel Pacing Threshold Amplitude: 1.125 V
Lead Channel Pacing Threshold Pulse Width: 0.4 ms
Lead Channel Pacing Threshold Pulse Width: 0.4 ms
Lead Channel Pacing Threshold Pulse Width: 0.4 ms
Lead Channel Sensing Intrinsic Amplitude: 1.25 mV
Lead Channel Sensing Intrinsic Amplitude: 1.25 mV
Lead Channel Sensing Intrinsic Amplitude: 9.375 mV
Lead Channel Sensing Intrinsic Amplitude: 9.375 mV
Lead Channel Setting Pacing Amplitude: 2 V
Lead Channel Setting Pacing Amplitude: 2 V
Lead Channel Setting Pacing Amplitude: 2.25 V
Lead Channel Setting Pacing Pulse Width: 0.4 ms
Lead Channel Setting Pacing Pulse Width: 0.4 ms
Lead Channel Setting Sensing Sensitivity: 0.3 mV

## 2020-05-30 ENCOUNTER — Other Ambulatory Visit (HOSPITAL_COMMUNITY): Payer: Self-pay | Admitting: Internal Medicine

## 2020-05-30 NOTE — Addendum Note (Signed)
Addended by: Elease Etienne A on: 05/30/2020 10:33 AM   Modules accepted: Level of Service

## 2020-05-30 NOTE — Progress Notes (Signed)
Remote ICD transmission.   

## 2020-06-10 ENCOUNTER — Telehealth: Payer: Self-pay | Admitting: *Deleted

## 2020-06-10 NOTE — Telephone Encounter (Signed)
Patient walked in with SCAT form, needs this complete today so that he can get to his appointment 1/17. Dr Luciana Axe is not in clinic until next week, patient cannot wait. RN printed previous form from 2018, Khaden will bring this to his PCP or cardiologist to see if they can complete the form for him. Andree Coss, RN

## 2020-06-11 ENCOUNTER — Telehealth: Payer: Self-pay | Admitting: Internal Medicine

## 2020-06-11 NOTE — Telephone Encounter (Signed)
Returned call to Pt.  Advised paperwork requested was complete and ready for pick up.

## 2020-06-11 NOTE — Telephone Encounter (Signed)
Patient states that he dropped off paperwork yesterday for Dr. Ladona Ridgel to fill out for transportation and he would like to know if that has been filled out yet. Please advise.

## 2020-06-17 ENCOUNTER — Ambulatory Visit (INDEPENDENT_AMBULATORY_CARE_PROVIDER_SITE_OTHER): Payer: Medicare Other

## 2020-06-17 ENCOUNTER — Encounter (HOSPITAL_COMMUNITY): Payer: Medicare Other | Admitting: Internal Medicine

## 2020-06-17 DIAGNOSIS — I428 Other cardiomyopathies: Secondary | ICD-10-CM

## 2020-06-18 ENCOUNTER — Encounter: Payer: Medicare Other | Admitting: Internal Medicine

## 2020-06-18 LAB — CUP PACEART REMOTE DEVICE CHECK
Battery Remaining Longevity: 6 mo
Battery Voltage: 2.82 V
Brady Statistic AP VP Percent: 0.04 %
Brady Statistic AP VS Percent: 0.02 %
Brady Statistic AS VP Percent: 98.59 %
Brady Statistic AS VS Percent: 1.36 %
Brady Statistic RA Percent Paced: 0.05 %
Brady Statistic RV Percent Paced: 2.74 %
Date Time Interrogation Session: 20220117033624
HighPow Impedance: 81 Ohm
Implantable Lead Implant Date: 20150126
Implantable Lead Implant Date: 20150126
Implantable Lead Implant Date: 20150126
Implantable Lead Location: 753858
Implantable Lead Location: 753859
Implantable Lead Location: 753860
Implantable Lead Model: 4396
Implantable Lead Model: 5076
Implantable Lead Model: 6935
Implantable Pulse Generator Implant Date: 20150126
Lead Channel Impedance Value: 361 Ohm
Lead Channel Impedance Value: 399 Ohm
Lead Channel Impedance Value: 418 Ohm
Lead Channel Impedance Value: 418 Ohm
Lead Channel Impedance Value: 703 Ohm
Lead Channel Impedance Value: 931 Ohm
Lead Channel Pacing Threshold Amplitude: 0.5 V
Lead Channel Pacing Threshold Amplitude: 0.75 V
Lead Channel Pacing Threshold Amplitude: 0.875 V
Lead Channel Pacing Threshold Pulse Width: 0.4 ms
Lead Channel Pacing Threshold Pulse Width: 0.4 ms
Lead Channel Pacing Threshold Pulse Width: 0.4 ms
Lead Channel Sensing Intrinsic Amplitude: 0.625 mV
Lead Channel Sensing Intrinsic Amplitude: 0.625 mV
Lead Channel Sensing Intrinsic Amplitude: 9.625 mV
Lead Channel Sensing Intrinsic Amplitude: 9.625 mV
Lead Channel Setting Pacing Amplitude: 2 V
Lead Channel Setting Pacing Amplitude: 2 V
Lead Channel Setting Pacing Amplitude: 2 V
Lead Channel Setting Pacing Pulse Width: 0.4 ms
Lead Channel Setting Pacing Pulse Width: 0.4 ms
Lead Channel Setting Sensing Sensitivity: 0.3 mV

## 2020-06-29 ENCOUNTER — Other Ambulatory Visit: Payer: Self-pay | Admitting: Internal Medicine

## 2020-07-01 ENCOUNTER — Other Ambulatory Visit (HOSPITAL_COMMUNITY): Payer: Self-pay

## 2020-07-01 MED ORDER — HYDRALAZINE HCL 50 MG PO TABS: 50.0000 mg | ORAL_TABLET | Freq: Three times a day (TID) | ORAL | 11 refills | Status: DC

## 2020-07-01 NOTE — Progress Notes (Signed)
Remote ICD transmission.   

## 2020-07-01 NOTE — Addendum Note (Signed)
Addended by: Elease Etienne A on: 07/01/2020 09:03 AM   Modules accepted: Level of Service

## 2020-07-03 ENCOUNTER — Ambulatory Visit (INDEPENDENT_AMBULATORY_CARE_PROVIDER_SITE_OTHER): Payer: Medicare Other | Admitting: Family Medicine

## 2020-07-03 VITALS — BP 122/88 | HR 86 | Temp 98.8°F

## 2020-07-03 DIAGNOSIS — J3489 Other specified disorders of nose and nasal sinuses: Secondary | ICD-10-CM

## 2020-07-03 DIAGNOSIS — R0981 Nasal congestion: Secondary | ICD-10-CM | POA: Diagnosis not present

## 2020-07-03 MED ORDER — FLUTICASONE PROPIONATE 50 MCG/ACT NA SUSP: 2.0000 | Freq: Every day | NASAL | 6 refills | Status: DC

## 2020-07-03 NOTE — Patient Instructions (Signed)
It was a pleasure to see you today!  Thank you for choosing [facility] for your primary care.   Our plans for today were:  I have tested you for COVID. I will call you the results.   Continue Mucinex as needed  Use flonase: 2 puffs in each nostril daily (this is best used if taken EVERYDAY, give it time to work)  [employer],   [employer], DO   What You Can Do to Feel Better When You Have a Viral Illness Common Symptoms: runny eyes, muscle aches, throat irritation, ear pain, cough, sneezing, runny nose or congested nose, sinus pressure, headache, fever, fatigue   For your cough, try these:  Teaspoon of honey either alone or mixed with warm water  Tessalon pearls  Lozenges or hard candies  Laying with head elevated  Vicks/menthol rub topically on chest   For your congestion, try these:   Steroid nasal spray such as fluticasone or budesonide- helps prevent swelling in the nasal passage  Guaifenesin (mucinex) - helps thin the mucus  Steam- in a closed bathroom with hot shower running or a bedside humidifier  Drinking plenty of fluids to stay hydrated can help thin mucus   For your runny nose:   Atrovent nasal spray- helps decrease the drainage (can lead to dryness if overdone)  Nasal rinses such as a netty pot or a bulb syringe using filtered water mixed with small amount of baking soda and/or sea salt   For your fever:   Acetaminophen (Tylenol) up to 4g per day for most people  Ibuprofen 600mg  up to three times per day for most people   For your sore throat:  Drinking either warm or cold liquids (whichever feels best to you)  Gargling warm salt water     Help prevent spreading of infection to others.  Wash your hands frequently  Avoid crowded places  Wear a mask when in public  Get your regularly scheduled vaccinations as they are recommended by the CDC.   Fun facts: -Antibiotics treat bacteria and have no effect on viruses so are  not helpful in the vast majority of upper respiratory illnesses which are caused by common cold or flu viruses.  -Generic over the counter (OTC) medications have the same active ingredients and effectiveness of the more expensive name-brand version. -Vaccines are available to prevent infection with several of the most infectious/deadly viruses.

## 2020-07-03 NOTE — Progress Notes (Signed)
Subjective:   Patient ID: [name]    DOB: June 04, 1965, 55 y.o. male   MRN: [id number]  [name] is a 55 y.o. male with a history of chronic systolic heart failure, nonischemic cardiomyopathy, paroxysmal ventricular fibrillation, hypertension, chronic hep C/cirrhosis, GERD, neurodermatitis, genital herpes, alcohol abuse, HIV, ICD, hyperlipidemia, PTSD here for congestion  Rhinorrhea: Patient notes that he had runny nose and cough that started 2 weeks ago. This resolved about a week ago, however on Monday he developed rhinorrhea again. Denies recurrence of cough. Denies fever, chills, SOB, body aches, sore throat. He feels mildly congested. Denies COVID exposures and is COVID vaccinated x 3. He has treated his symptoms with Claritin and Mucinex. He notes that his symptoms resolved with the mucinex. Denies history of allergies.   Review of Systems:  Per HPI.   Objective:   BP 122/88   Pulse 86   Temp 98.8 F (37.1 C) (Oral)   SpO2 98%  Vitals and nursing note reviewed.  General: pleasant older male, sitting comfortably in exam chair, well nourished, well developed, in no acute distress with non-toxic appearance HEENT: normocephalic, atraumatic, moist mucous membranes, oropharynx clear without exudate, mild cobblestoning in posterior oropharynx, TM normal bilaterally, swollen, erythematous, moist nasal turbinates bilaterally  CV: regular rate and rhythm without murmurs, rubs, or gallops Lungs: clear to auscultation bilaterally with normal work of breathing on room air, speaking in full sentences Skin: warm, dry MSK:  gait normal Neuro: Alert and oriented, speech normal  Assessment & Plan:   Nasal congestion with rhinorrhea Acute. Likely viral related. COVID testing completed today. No red flag symptoms or signs of bacterial infection.  - Recommended symptomatic treatment with daily Flonase and Mucinex.  - Likely can discontinue Claritin given lack of history of  allergies. Return precautions discussed.  - follow up COVID testing  Orders Placed This Encounter  Procedures  . Novel Coronavirus, NAA ([facility])    Order Specific Question:   Is this test for diagnosis or screening    Answer:   Diagnosis of ill patient    Order Specific Question:   Symptomatic for COVID-19 as defined by CDC    Answer:   Yes    Order Specific Question:   Date of Symptom Onset    Answer:   06/30/2020    Order Specific Question:   Hospitalized for COVID-19    Answer:   No    Order Specific Question:   Admitted to ICU for COVID-19    Answer:   No    Order Specific Question:   Resident in a congregate (group) care setting    Answer:   No    Order Specific Question:   Is the patient student?    Answer:   No    Order Specific Question:   Employed in healthcare setting    Answer:   No    Order Specific Question:   Has patient completed COVID vaccination(s) (2 doses of Pfizer/Moderna 1 dose of [employer])    Answer:   Yes    Order Specific Question:   Has patient completed COVID Booster / 3rd dose    Answer:   Yes    Order Specific Question:   Previously tested for COVID-19    Answer:   No   Meds ordered this encounter  Medications  . fluticasone (FLONASE) 50 MCG/ACT nasal spray    Sig: Place 2 sprays into both nostrils daily.    Dispense:  16  g    Refill:  6     Orpah Cobb, DO PGY-3, [facility] 07/03/2020 8:48 PM

## 2020-07-03 NOTE — Assessment & Plan Note (Signed)
Acute. Likely viral related. COVID testing completed today. No red flag symptoms or signs of bacterial infection.  - Recommended symptomatic treatment with daily Flonase and Mucinex.  - Likely can discontinue Claritin given lack of history of allergies. Return precautions discussed.  - follow up COVID testing

## 2020-07-04 LAB — NOVEL CORONAVIRUS, NAA: SARS-CoV-2, NAA: NOT DETECTED

## 2020-07-04 LAB — SARS-COV-2, NAA 2 DAY TAT

## 2020-07-08 ENCOUNTER — Encounter: Payer: Self-pay | Admitting: Internal Medicine

## 2020-07-08 ENCOUNTER — Other Ambulatory Visit: Payer: Self-pay

## 2020-07-08 ENCOUNTER — Ambulatory Visit (INDEPENDENT_AMBULATORY_CARE_PROVIDER_SITE_OTHER): Payer: Medicare Other | Admitting: Internal Medicine

## 2020-07-08 VITALS — BP 146/94 | HR 83 | Ht 64.0 in | Wt 187.0 lb

## 2020-07-08 DIAGNOSIS — I472 Ventricular tachycardia, unspecified: Secondary | ICD-10-CM

## 2020-07-08 DIAGNOSIS — I428 Other cardiomyopathies: Secondary | ICD-10-CM

## 2020-07-08 MED ORDER — AMIODARONE HCL 200 MG PO TABS
100.0000 mg | ORAL_TABLET | Freq: Every day | ORAL | 3 refills | Status: DC
Start: 2020-07-08 — End: 2021-03-26

## 2020-07-08 NOTE — Patient Instructions (Signed)

## 2020-07-08 NOTE — Progress Notes (Signed)
HPI [name] returns today for followup. He is a pleasant middle aged man with chronic systolic heart failure and VT, prior ETOH abuse, s/p ICD insertion. His VT has been well controlled on amiodarone 100 mg daily and with cessation of ETOH abuse. He has not had syncope. He admits to dietary indiscretion and has not been working out. He has gained over 30 lbs in the past 5 years but only 3 pounds in the past year. He denies chest pain or sob.  Allergies  Allergen Reactions  . Bactrim Rash     Current Outpatient Medications  Medication Sig Dispense Refill  . amiodarone (PACERONE) 200 MG tablet Take 100 mg by mouth daily.    . carvedilol (COREG) 25 MG tablet Take 1 tablet (25 mg total) by mouth 2 (two) times daily with a meal. 180 tablet 3  . clobetasol ointment (TEMOVATE) 0.05 % Apply 1 application topically 2 (two) times daily.  0  . colchicine 0.6 MG tablet TAKE 1 TABLET(0.6 MG) BY MOUTH TWICE DAILY AS NEEDED FOR GOUT FLARES 30 tablet 6  . diclofenac sodium (VOLTAREN) 1 % GEL Apply 2 g topically 4 (four) times daily. 100 g 11  . dolutegravir (TIVICAY) 50 MG tablet 1 tab daily 30 tablet 11  . doxepin (SINEQUAN) 25 MG capsule Take 1 capsule by mouth as needed.  5  . emtricitabine-rilpivir-tenofovir AF (ODEFSEY) 200-25-25 MG TABS tablet Take 1 tablet by mouth daily. 30 tablet 11  . ENTRESTO 97-103 MG TAKE 1 TABLET BY MOUTH TWICE DAILY 60 tablet 3  . fluticasone (FLONASE) 50 MCG/ACT nasal spray Place 2 sprays into both nostrils daily. 16 g 6  . furosemide (LASIX) 40 MG tablet TAKE 1 TABLET BY MOUTH EVERY DAY AS NEEDED 90 tablet 1  . gabapentin (NEURONTIN) 300 MG capsule Take 300 mg by mouth 3 (three) times daily.    . hydrALAZINE (APRESOLINE) 50 MG tablet Take 1 tablet (50 mg total) by mouth 3 (three) times daily. Do not take if your systolic blood pressure is less that 120. 120 tablet 11  . hydrOXYzine (ATARAX/VISTARIL) 50 MG tablet take 1 tablet by mouth three times a day if needed  15 tablet 1  . mirtazapine (REMERON) 7.5 MG tablet TAKE 1 TABLET(7.5 MG) BY MOUTH AT BEDTIME 30 tablet 1  . ondansetron (ZOFRAN) 4 MG tablet Take 4 mg by mouth as needed.  0  . spironolactone (ALDACTONE) 25 MG tablet TAKE 1 TABLET(25 MG) BY MOUTH AT BEDTIME 30 tablet 3  . triamcinolone cream (KENALOG) 0.1 % Apply 1 application topically 3 (three) times daily.  0   No current facility-administered medications for this visit.     Past Medical History:  Diagnosis Date  . CHF (congestive heart failure) (HCC)   . Chronic systolic heart failure (HCC)    a. Echo 12/05/11:  EF 20-25%, diff HK with mild sparing of IL wall, mild AI, mod MR, mild LAE, mild RVE, mild reduced RVF.;  b.  Echo 05/11/2012:  Mild LVH, EF 20-25%, Gr 1 diast dysfn, mod MR, mild LAE  . Depression   . G6PD deficiency   . GERD (gastroesophageal reflux disease)   . Hepatitis    Hep B and Hep C (patient does not report this but these are listed in previous notes.)  . HIV infection (HCC)   . Hypertension   . NICM (nonischemic cardiomyopathy) (HCC)    cardia CTA 8/13 negative for obstructive CAD  . Presence of permanent  cardiac pacemaker   . VT (ventricular tachycardia) (HCC)     ROS:   All systems reviewed and negative except as noted in the HPI.   Past Surgical History:  Procedure Laterality Date  . BI-VENTRICULAR PACEMAKER INSERTION N/A 06/26/2013   Procedure: BI-VENTRICULAR PACEMAKER INSERTION (CRT-P);  Surgeon: Marinus Maw, MD;  Location: [facility] CATH LAB;  Service: Cardiovascular;  Laterality: N/A;  . COLONOSCOPY WITH PROPOFOL N/A 08/14/2016   Procedure: COLONOSCOPY WITH PROPOFOL;  Surgeon: Jeani Hawking, MD;  Location: [facility] ENDOSCOPY;  Service: Endoscopy;  Laterality: N/A;  . ELECTROPHYSIOLOGY STUDY N/A 02/19/2012   Procedure: ELECTROPHYSIOLOGY STUDY;  Surgeon: Marinus Maw, MD;  Location: [facility] CATH LAB;  Service: Cardiovascular;  Laterality: N/A;  . ESOPHAGOGASTRODUODENOSCOPY  12/07/2011   Procedure:  ESOPHAGOGASTRODUODENOSCOPY (EGD);  Surgeon: [name], MD,FACG;  Location: [facility] ENDOSCOPY;  Service: Endoscopy;  Laterality: N/A;  . FINGER SURGERY     Thumb laceration.    Marland Kitchen HARDWARE REMOVAL Right 07/01/2018   Procedure: HARDWARE REMOVAL;  Surgeon: Sheral Apley, MD;  Location: [facility];  Service: Orthopedics;  Laterality: Right;  . INCISION AND DRAINAGE Right 07/01/2018   Procedure: INCISION AND DRAINAGE;  Surgeon: Sheral Apley, MD;  Location: [facility];  Service: Orthopedics;  Laterality: Right;  . LEFT HEART CATHETERIZATION WITH CORONARY ANGIOGRAM N/A 01/31/2014   Procedure: LEFT HEART CATHETERIZATION WITH CORONARY ANGIOGRAM;  Surgeon: Iran Ouch, MD;  Location: [facility] CATH LAB;  Service: Cardiovascular;  Laterality: N/A;  . ORIF ANKLE FRACTURE Right 01/26/2017   Procedure: OPEN REDUCTION INTERNAL FIXATION (ORIF) ANKLE FRACTURE;  Surgeon: Sheral Apley, MD;  Location: [facility] OR;  Service: Orthopedics;  Laterality: Right;     Family History  Problem Relation Age of Onset  . Lung cancer Mother   . Colon cancer Other 50     Social History   Socioeconomic History  . Marital status: Single    Spouse name: Not on file  . Number of children: Not on file  . Years of education: Not on file  . Highest education level: Not on file  Occupational History  . Occupation: Unemployed  Tobacco Use  . Smoking status: Never Smoker  . Smokeless tobacco: Never Used  Vaping Use  . Vaping Use: Never used  Substance and Sexual Activity  . Alcohol use: Yes    Alcohol/week: 1.0 standard drink    Types: 1 Cans of beer per week    Comment: occ beer  . Drug use: Yes    Frequency: 7.0 times per week    Types: Marijuana    Comment: 1x/week, smoked day before surgery  . Sexual activity: Yes    Partners: Male    Comment:  declined condoms 05/2019  Other Topics Concern  . Not on file  Social History Narrative   Lives alone.  Drinks beer daily.  Liquor  rarely.  He occasionally smokes marijuana..   Social Determinants of Health   Financial Resource Strain: Not on file  Food Insecurity: Not on file  Transportation Needs: Not on file  Physical Activity: Not on file  Stress: Not on file  Social Connections: Not on file  Intimate Partner Violence: Not on file     BP (!) 146/94   Pulse 83   Ht 5\' 4"  (1.626 m)   Wt 187 lb (84.8 kg)   SpO2 96%   BMI 32.10 kg/m   Physical Exam:  Well appearing NAD HEENT: Unremarkable Neck:  6 cm JVD,  no thyromegally Lymphatics:  No adenopathy Back:  No CVA tenderness Lungs:  Clear with no wheezes HEART:  Regular rate rhythm, no murmurs, no rubs, no clicks Abd:  soft, positive bowel sounds, no organomegally, no rebound, no guarding Ext:  2 plus pulses, no edema, no cyanosis, no clubbing Skin:  No rashes no nodules Neuro:  CN II through XII intact, motor grossly intact  EKG - nsr with biv pacing  DEVICE  Normal device function.  See PaceArt for details.   Assess/Plan: 1. Chronic systolic heart failure - his symptoms remain class 2. He will continue his current meds.  2. VT - he has maintained NSR on low dose amiodarone  3. ETOH abuse - he has not consumed excess ETOH. He drinks rarely. I encouraged him to never drink more than 2 beers a day or 7 in a week. 4. ICD - his medtronic Biv ICD is working normally. He is less than a year out from ERI.  Sharlot Gowda Kyl Givler,MD

## 2020-07-12 ENCOUNTER — Other Ambulatory Visit: Payer: Self-pay | Admitting: Internal Medicine

## 2020-07-12 DIAGNOSIS — G4700 Insomnia, unspecified: Secondary | ICD-10-CM

## 2020-07-12 LAB — CUP PACEART INCLINIC DEVICE CHECK
Battery Remaining Longevity: 6 mo
Battery Voltage: 2.81 V
Brady Statistic AP VP Percent: 0.06 %
Brady Statistic AP VS Percent: 0.02 %
Brady Statistic AS VP Percent: 98.6 %
Brady Statistic AS VS Percent: 1.33 %
Brady Statistic RA Percent Paced: 0.08 %
Brady Statistic RV Percent Paced: 2.74 %
Date Time Interrogation Session: 20220207164700
HighPow Impedance: 80 Ohm
Implantable Lead Implant Date: 20150126
Implantable Lead Implant Date: 20150126
Implantable Lead Implant Date: 20150126
Implantable Lead Location: 753858
Implantable Lead Location: 753859
Implantable Lead Location: 753860
Implantable Lead Model: 4396
Implantable Lead Model: 5076
Implantable Lead Model: 6935
Implantable Pulse Generator Implant Date: 20150126
Lead Channel Impedance Value: 1083 Ohm
Lead Channel Impedance Value: 418 Ohm
Lead Channel Impedance Value: 418 Ohm
Lead Channel Impedance Value: 475 Ohm
Lead Channel Impedance Value: 532 Ohm
Lead Channel Impedance Value: 817 Ohm
Lead Channel Pacing Threshold Amplitude: 0.5 V
Lead Channel Pacing Threshold Amplitude: 0.75 V
Lead Channel Pacing Threshold Amplitude: 0.75 V
Lead Channel Pacing Threshold Pulse Width: 0.4 ms
Lead Channel Pacing Threshold Pulse Width: 0.4 ms
Lead Channel Pacing Threshold Pulse Width: 0.4 ms
Lead Channel Sensing Intrinsic Amplitude: 0.625 mV
Lead Channel Sensing Intrinsic Amplitude: 0.75 mV
Lead Channel Sensing Intrinsic Amplitude: 11.25 mV
Lead Channel Sensing Intrinsic Amplitude: 9.125 mV
Lead Channel Setting Pacing Amplitude: 2 V
Lead Channel Setting Pacing Amplitude: 2 V
Lead Channel Setting Pacing Amplitude: 2.25 V
Lead Channel Setting Pacing Pulse Width: 0.4 ms
Lead Channel Setting Pacing Pulse Width: 0.4 ms
Lead Channel Setting Sensing Sensitivity: 0.3 mV

## 2020-07-16 ENCOUNTER — Ambulatory Visit (INDEPENDENT_AMBULATORY_CARE_PROVIDER_SITE_OTHER): Payer: Medicare Other

## 2020-07-16 DIAGNOSIS — I428 Other cardiomyopathies: Secondary | ICD-10-CM

## 2020-07-16 LAB — CUP PACEART REMOTE DEVICE CHECK
Battery Remaining Longevity: 5 mo
Battery Voltage: 2.81 V
Brady Statistic AP VP Percent: 0.04 %
Brady Statistic AP VS Percent: 0.02 %
Brady Statistic AS VP Percent: 98.69 %
Brady Statistic AS VS Percent: 1.25 %
Brady Statistic RA Percent Paced: 0.05 %
Brady Statistic RV Percent Paced: 0.79 %
Date Time Interrogation Session: 20220215043826
HighPow Impedance: 90 Ohm
Implantable Lead Implant Date: 20150126
Implantable Lead Implant Date: 20150126
Implantable Lead Implant Date: 20150126
Implantable Lead Location: 753858
Implantable Lead Location: 753859
Implantable Lead Location: 753860
Implantable Lead Model: 4396
Implantable Lead Model: 5076
Implantable Lead Model: 6935
Implantable Pulse Generator Implant Date: 20150126
Lead Channel Impedance Value: 1026 Ohm
Lead Channel Impedance Value: 418 Ohm
Lead Channel Impedance Value: 475 Ohm
Lead Channel Impedance Value: 475 Ohm
Lead Channel Impedance Value: 532 Ohm
Lead Channel Impedance Value: 779 Ohm
Lead Channel Pacing Threshold Amplitude: 0.5 V
Lead Channel Pacing Threshold Amplitude: 0.75 V
Lead Channel Pacing Threshold Amplitude: 0.875 V
Lead Channel Pacing Threshold Pulse Width: 0.4 ms
Lead Channel Pacing Threshold Pulse Width: 0.4 ms
Lead Channel Pacing Threshold Pulse Width: 0.4 ms
Lead Channel Sensing Intrinsic Amplitude: 1.5 mV
Lead Channel Sensing Intrinsic Amplitude: 1.5 mV
Lead Channel Sensing Intrinsic Amplitude: 7.125 mV
Lead Channel Sensing Intrinsic Amplitude: 7.125 mV
Lead Channel Setting Pacing Amplitude: 2 V
Lead Channel Setting Pacing Amplitude: 2 V
Lead Channel Setting Pacing Amplitude: 2.25 V
Lead Channel Setting Pacing Pulse Width: 0.4 ms
Lead Channel Setting Pacing Pulse Width: 0.4 ms
Lead Channel Setting Sensing Sensitivity: 0.3 mV

## 2020-07-17 ENCOUNTER — Other Ambulatory Visit: Payer: Self-pay

## 2020-07-17 DIAGNOSIS — G4700 Insomnia, unspecified: Secondary | ICD-10-CM

## 2020-07-17 NOTE — Telephone Encounter (Signed)
Patient requesting refill on Rameron. Patient is also taking doxepin prescribed by his dermatologist. There is drug interaction between the 2 drugs. Is it ok to refill the Rameron? [name] T Pricilla Loveless

## 2020-07-19 ENCOUNTER — Telehealth: Payer: Self-pay

## 2020-07-19 DIAGNOSIS — G4700 Insomnia, unspecified: Secondary | ICD-10-CM

## 2020-07-19 MED ORDER — MIRTAZAPINE 7.5 MG PO TABS: ORAL_TABLET | ORAL | 0 refills | Status: DC

## 2020-07-19 NOTE — Telephone Encounter (Signed)
Received call from patient requesting Remeron refill. Per chart, there is an interaction with patient's doxepin and Remeron. Patient states he has been taking both without any issue. RN relayed information to Rexene Alberts, NP, okay to refill Remeron per Judeth Cornfield.   Sandie Ano, RN

## 2020-07-23 NOTE — Progress Notes (Signed)
Remote ICD transmission.   

## 2020-07-30 ENCOUNTER — Other Ambulatory Visit (HOSPITAL_COMMUNITY): Payer: Self-pay | Admitting: Internal Medicine

## 2020-08-04 NOTE — Progress Notes (Signed)
Patient ID: [name], male   DOB: 10/24/1965, 55 y.o.   MRN: [id number]   [facility]  Patient ID: [name], male   DOB: 1965/10/18, 55 y.o.   MRN: [id number]   Primary Physician: [name], MD  Primary Cardiologist: Dr Ladona Ridgel  Primary HF: Dr. [name]    HPI: [name] is a 55 y.o. male with HIV dx'd 1998, HCV, ETOH abuse and CHF due to NICM s/p Medtronic CRT-D (1/15). He also has a h/o syncope and previously had an EP study which was non-inducible for VT.    Was admitted 8/14 for ADHF. ECHO EF 20-25%.   Repeat Echo 10/16 EF 55%.  Admitted 8/18 for syncope from low BP resulting in trimalleolar fracture of the right ankle and left proximal tibia. He underwent ORIF of right trimalleolar fracture. Echo EF 65-70%.  Last seen 10/21 EF 55-60%   Here for f/u. Feels good. No SOB or CP. Continues to drink 2-3 12 oz beers a few times per week. No edema, orthopnea or PND.   ICD interrogated: No VT/AF fluid up and down. Activity level 4 hours. Personally reviewed   ECHO 04/2013: EF 20-25%, mod/severe MR, LA mod dilated ECHO 10/16/13 EF 20-25%  ECHO 03/14/15 EF 55% ECHO 12/17 EF 55% ECHO 8/18 EF 65-70%.   SH: Lives with partner in [place], drinking a 2-3 12 oz beers daily.  Review of systems complete and found to be negative unless listed in HPI.    Family History  Problem Relation Age of Onset  . Lung cancer Mother   . Colon cancer Other 50   Past Medical History:  Diagnosis Date  . CHF (congestive heart failure) (HCC)   . Chronic systolic heart failure (HCC)    a. Echo 12/05/11:  EF 20-25%, diff HK with mild sparing of IL wall, mild AI, mod MR, mild LAE, mild RVE, mild reduced RVF.;  b.  Echo 05/11/2012:  Mild LVH, EF 20-25%, Gr 1 diast dysfn, mod MR, mild LAE  . Depression   . G6PD deficiency   . GERD (gastroesophageal reflux disease)   . Hepatitis    Hep B and Hep C (patient does not report this but these are listed in previous  notes.)  . HIV infection (HCC)   . Hypertension   . NICM (nonischemic cardiomyopathy) (HCC)    cardia CTA 8/13 negative for obstructive CAD  . Presence of permanent cardiac pacemaker   . VT (ventricular tachycardia) (HCC)     Current Outpatient Medications  Medication Sig Dispense Refill  . amiodarone (PACERONE) 200 MG tablet Take 0.5 tablets (100 mg total) by mouth daily. 45 tablet 3  . carvedilol (COREG) 25 MG tablet TAKE 1 TABLET(25 MG) BY MOUTH TWICE DAILY WITH A MEAL 180 tablet 3  . clobetasol ointment (TEMOVATE) 0.05 % Apply 1 application topically 2 (two) times daily.  0  . colchicine 0.6 MG tablet TAKE 1 TABLET(0.6 MG) BY MOUTH TWICE DAILY AS NEEDED FOR GOUT FLARES 30 tablet 6  . diclofenac sodium (VOLTAREN) 1 % GEL Apply 2 g topically 4 (four) times daily. 100 g 11  . dolutegravir (TIVICAY) 50 MG tablet 1 tab daily 30 tablet 11  . doxepin (SINEQUAN) 25 MG capsule Take 1 capsule by mouth as needed.  5  . emtricitabine-rilpivir-tenofovir AF (ODEFSEY) 200-25-25 MG TABS tablet Take 1 tablet by mouth daily. 30 tablet 11  . ENTRESTO 97-103 MG TAKE 1 TABLET BY MOUTH TWICE DAILY 60 tablet 3  .  fluticasone (FLONASE) 50 MCG/ACT nasal spray Place 2 sprays into both nostrils daily. 16 g 6  . furosemide (LASIX) 40 MG tablet TAKE 1 TABLET BY MOUTH EVERY DAY AS NEEDED 90 tablet 1  . gabapentin (NEURONTIN) 300 MG capsule Take 300 mg by mouth 3 (three) times daily.    . hydrALAZINE (APRESOLINE) 50 MG tablet Take 1 tablet (50 mg total) by mouth 3 (three) times daily. Do not take if your systolic blood pressure is less that 120. 120 tablet 11  . hydrOXYzine (ATARAX/VISTARIL) 50 MG tablet take 1 tablet by mouth three times a day if needed 15 tablet 1  . mirtazapine (REMERON) 7.5 MG tablet TAKE 1 TABLET(7.5 MG) BY MOUTH AT BEDTIME 30 tablet 0  . ondansetron (ZOFRAN) 4 MG tablet Take 4 mg by mouth as needed.  0  . spironolactone (ALDACTONE) 25 MG tablet TAKE 1 TABLET(25 MG) BY MOUTH AT BEDTIME 30  tablet 3  . triamcinolone cream (KENALOG) 0.1 % Apply 1 application topically 3 (three) times daily.  0   No current facility-administered medications for this encounter.    Vitals:   08/05/20 0925  BP: 110/78  Pulse: 73  SpO2: 95%  Weight: 85 kg (187 lb 6.4 oz)   Wt Readings from Last 3 Encounters:  08/05/20 85 kg (187 lb 6.4 oz)  07/08/20 84.8 kg (187 lb)  05/01/20 81.6 kg (180 lb)   PHYSICAL EXAM: General:  Well appearing. No resp difficulty HEENT: normal Neck: supple. no JVD. Carotids 2+ bilat; no bruits. No lymphadenopathy or thryomegaly appreciated. Cor: PMI nondisplaced. Regular rate & rhythm. No rubs, gallops or murmurs. Lungs: clear Abdomen: obese soft, nontender, nondistended. No hepatosplenomegaly. No bruits or masses. Good bowel sounds. Extremities: no cyanosis, clubbing, rash, edema Neuro: alert & orientedx3, cranial nerves grossly intact. moves all 4 extremities w/o difficulty. Affect pleasant   ASSESSMENT & PLAN:  1) Chronic systolic HF; now with recovered EF: Nonischemic cardiomyopathy, ?due to ETOH abuse. EF 20-25% in 2014. EF 55% (03/2015) s/p Medtronic CRT-D (06/2013). 12/2016 ECHO EF 65-70%.  - Echo 10/21 EF 55-60%  - NYHA Class II. Volume status up and down  - he continues to drink beer, 2-3 12 oz/day. Cessation advised.  - Continue Coreg 25 mg twice a day.   - Continue spiro to 25 mg daily.  - Continue Entresto 97/103 mg BID. - Start Jardiance to help with HF and fluid overload  2) HTN - Blood pressure well controlled. Continue current regimen.  3) Ventricular tachycardia:  - Has ICD, followed Dr. Ladona Ridgel  - No VT on interrogation.  - Continue Amio 100 mg daily.  - Followed by Dr. Ladona Ridgel. Recent labs ok. Realizes need for eye doctor f/u  4) HIV - Stable.  - Followed by Dr Luciana Axe. Has appt today   5) ETOH abuse  - continues to drink 2-3 12 oz beers a few times per week  - encouraged complete cessation  [name], MD  08/05/2020 9:52 AM

## 2020-08-05 ENCOUNTER — Other Ambulatory Visit: Payer: Self-pay

## 2020-08-05 ENCOUNTER — Encounter (HOSPITAL_COMMUNITY): Payer: Self-pay | Admitting: Internal Medicine

## 2020-08-05 ENCOUNTER — Ambulatory Visit (HOSPITAL_COMMUNITY)
Admission: RE | Admit: 2020-08-05 | Discharge: 2020-08-05 | Disposition: A | Discharge status: Home / Self Care | Payer: Medicare Other | Source: Ambulatory Visit | Attending: Internal Medicine | Admitting: Internal Medicine

## 2020-08-05 VITALS — BP 110/78 | HR 73 | Wt 187.4 lb

## 2020-08-05 DIAGNOSIS — F1010 Alcohol abuse, uncomplicated: Secondary | ICD-10-CM | POA: Insufficient documentation

## 2020-08-05 DIAGNOSIS — I428 Other cardiomyopathies: Secondary | ICD-10-CM | POA: Diagnosis not present

## 2020-08-05 DIAGNOSIS — I110 Hypertensive heart disease with heart failure: Secondary | ICD-10-CM | POA: Insufficient documentation

## 2020-08-05 DIAGNOSIS — Z21 Asymptomatic human immunodeficiency virus [HIV] infection status: Secondary | ICD-10-CM | POA: Insufficient documentation

## 2020-08-05 DIAGNOSIS — I5082 Biventricular heart failure: Secondary | ICD-10-CM | POA: Insufficient documentation

## 2020-08-05 DIAGNOSIS — Z79899 Other long term (current) drug therapy: Secondary | ICD-10-CM | POA: Insufficient documentation

## 2020-08-05 DIAGNOSIS — Z7901 Long term (current) use of anticoagulants: Secondary | ICD-10-CM | POA: Insufficient documentation

## 2020-08-05 DIAGNOSIS — I5022 Chronic systolic (congestive) heart failure: Secondary | ICD-10-CM | POA: Diagnosis not present

## 2020-08-05 DIAGNOSIS — I472 Ventricular tachycardia, unspecified: Secondary | ICD-10-CM

## 2020-08-05 MED ORDER — EMPAGLIFLOZIN 10 MG PO TABS: 10.0000 mg | ORAL_TABLET | Freq: Every day | ORAL | 11 refills | Status: DC

## 2020-08-05 NOTE — Addendum Note (Signed)
Encounter addended by: Theresia Bough, CMA on: 08/05/2020 10:02 AM  Actions taken: Pharmacy for encounter modified, Order list changed, Clinical Note Signed

## 2020-08-05 NOTE — Patient Instructions (Signed)
START Jardiance 10 mg, one tab daily  Your physician recommends that you schedule a follow-up appointment in: 6 months  in the Advanced Practitioners (PA/NP) Clinic   Do the following things EVERYDAY: 1) Weigh yourself in the morning before breakfast. Write it down and keep it in a log. 2) Take your medicines as prescribed 3) Eat low salt foods--Limit salt (sodium) to 2000 mg per day.  4) Stay as active as you can everyday 5) Limit all fluids for the day to less than 2 liters  At the [facility], you and your health needs are our priority. As part of our continuing mission to provide you with exceptional heart care, we have created designated Provider Care Teams. These Care Teams include your primary Cardiologist (physician) and Advanced Practice Providers (APPs- Physician Assistants and Nurse Practitioners) who all work together to provide you with the care you need, when you need it.   You may see any of the following providers on your designated Care Team at your next follow up: Marland Kitchen Dr [name] . Dr Marca Ancona . Dr Thornell Mule . Tonye Becket, NP . Robbie Lis, PA . Shanda Bumps [place],NP . Karle Plumber, PharmD   Please be sure to bring in all your medications bottles to every appointment.

## 2020-08-18 ENCOUNTER — Other Ambulatory Visit: Payer: Self-pay | Admitting: Infectious Diseases

## 2020-08-18 DIAGNOSIS — G4700 Insomnia, unspecified: Secondary | ICD-10-CM

## 2020-08-19 ENCOUNTER — Ambulatory Visit (INDEPENDENT_AMBULATORY_CARE_PROVIDER_SITE_OTHER): Payer: Medicare Other

## 2020-08-19 DIAGNOSIS — I428 Other cardiomyopathies: Secondary | ICD-10-CM

## 2020-08-20 LAB — CUP PACEART REMOTE DEVICE CHECK
Battery Remaining Longevity: 4 mo
Battery Voltage: 2.78 V
Brady Statistic AP VP Percent: 0.05 %
Brady Statistic AP VS Percent: 0.02 %
Brady Statistic AS VP Percent: 98.6 %
Brady Statistic AS VS Percent: 1.33 %
Brady Statistic RA Percent Paced: 0.07 %
Brady Statistic RV Percent Paced: 0.45 %
Date Time Interrogation Session: 20220321093524
HighPow Impedance: 85 Ohm
Implantable Lead Implant Date: 20150126
Implantable Lead Implant Date: 20150126
Implantable Lead Implant Date: 20150126
Implantable Lead Location: 753858
Implantable Lead Location: 753859
Implantable Lead Location: 753860
Implantable Lead Model: 4396
Implantable Lead Model: 5076
Implantable Lead Model: 6935
Implantable Pulse Generator Implant Date: 20150126
Lead Channel Impedance Value: 1064 Ohm
Lead Channel Impedance Value: 456 Ohm
Lead Channel Impedance Value: 456 Ohm
Lead Channel Impedance Value: 475 Ohm
Lead Channel Impedance Value: 513 Ohm
Lead Channel Impedance Value: 817 Ohm
Lead Channel Pacing Threshold Amplitude: 0.625 V
Lead Channel Pacing Threshold Amplitude: 0.75 V
Lead Channel Pacing Threshold Amplitude: 0.875 V
Lead Channel Pacing Threshold Pulse Width: 0.4 ms
Lead Channel Pacing Threshold Pulse Width: 0.4 ms
Lead Channel Pacing Threshold Pulse Width: 0.4 ms
Lead Channel Sensing Intrinsic Amplitude: 1.5 mV
Lead Channel Sensing Intrinsic Amplitude: 1.5 mV
Lead Channel Sensing Intrinsic Amplitude: 8.125 mV
Lead Channel Sensing Intrinsic Amplitude: 8.125 mV
Lead Channel Setting Pacing Amplitude: 2 V
Lead Channel Setting Pacing Amplitude: 2 V
Lead Channel Setting Pacing Amplitude: 2.25 V
Lead Channel Setting Pacing Pulse Width: 0.4 ms
Lead Channel Setting Pacing Pulse Width: 0.4 ms
Lead Channel Setting Sensing Sensitivity: 0.3 mV

## 2020-08-26 NOTE — Addendum Note (Signed)
Addended by: Elease Etienne A on: 08/26/2020 12:14 PM   Modules accepted: Level of Service

## 2020-08-26 NOTE — Progress Notes (Signed)
Remote ICD transmission.   

## 2020-09-03 ENCOUNTER — Other Ambulatory Visit (HOSPITAL_COMMUNITY): Payer: Self-pay | Admitting: Internal Medicine

## 2020-09-19 ENCOUNTER — Ambulatory Visit (INDEPENDENT_AMBULATORY_CARE_PROVIDER_SITE_OTHER): Payer: Medicare Other

## 2020-09-19 DIAGNOSIS — I428 Other cardiomyopathies: Secondary | ICD-10-CM

## 2020-09-20 LAB — CUP PACEART REMOTE DEVICE CHECK
Battery Remaining Longevity: 3 mo
Battery Voltage: 2.79 V
Brady Statistic AP VP Percent: 0.04 %
Brady Statistic AP VS Percent: 0.02 %
Brady Statistic AS VP Percent: 98.65 %
Brady Statistic AS VS Percent: 1.29 %
Brady Statistic RA Percent Paced: 0.06 %
Brady Statistic RV Percent Paced: 1.5 %
Date Time Interrogation Session: 20220421150015
HighPow Impedance: 81 Ohm
Implantable Lead Implant Date: 20150126
Implantable Lead Implant Date: 20150126
Implantable Lead Implant Date: 20150126
Implantable Lead Location: 753858
Implantable Lead Location: 753859
Implantable Lead Location: 753860
Implantable Lead Model: 4396
Implantable Lead Model: 5076
Implantable Lead Model: 6935
Implantable Pulse Generator Implant Date: 20150126
Lead Channel Impedance Value: 1083 Ohm
Lead Channel Impedance Value: 361 Ohm
Lead Channel Impedance Value: 456 Ohm
Lead Channel Impedance Value: 456 Ohm
Lead Channel Impedance Value: 513 Ohm
Lead Channel Impedance Value: 893 Ohm
Lead Channel Pacing Threshold Amplitude: 0.625 V
Lead Channel Pacing Threshold Amplitude: 0.875 V
Lead Channel Pacing Threshold Amplitude: 1.75 V
Lead Channel Pacing Threshold Pulse Width: 0.4 ms
Lead Channel Pacing Threshold Pulse Width: 0.4 ms
Lead Channel Pacing Threshold Pulse Width: 0.4 ms
Lead Channel Sensing Intrinsic Amplitude: 2 mV
Lead Channel Sensing Intrinsic Amplitude: 2 mV
Lead Channel Sensing Intrinsic Amplitude: 8 mV
Lead Channel Sensing Intrinsic Amplitude: 8 mV
Lead Channel Setting Pacing Amplitude: 2 V
Lead Channel Setting Pacing Amplitude: 2 V
Lead Channel Setting Pacing Amplitude: 2.75 V
Lead Channel Setting Pacing Pulse Width: 0.4 ms
Lead Channel Setting Pacing Pulse Width: 0.4 ms
Lead Channel Setting Sensing Sensitivity: 0.3 mV

## 2020-09-27 ENCOUNTER — Other Ambulatory Visit (HOSPITAL_COMMUNITY): Payer: Self-pay | Admitting: Internal Medicine

## 2020-10-07 NOTE — Progress Notes (Signed)
Remote ICD transmission.   

## 2020-10-07 NOTE — Addendum Note (Signed)
Addended by: Elease Etienne A on: 10/07/2020 03:58 PM   Modules accepted: Level of Service

## 2020-10-15 ENCOUNTER — Ambulatory Visit (INDEPENDENT_AMBULATORY_CARE_PROVIDER_SITE_OTHER): Payer: Medicare Other

## 2020-10-15 DIAGNOSIS — I428 Other cardiomyopathies: Secondary | ICD-10-CM

## 2020-10-15 LAB — CUP PACEART REMOTE DEVICE CHECK
Battery Remaining Longevity: 3 mo
Battery Voltage: 2.78 V
Brady Statistic AP VP Percent: 0.04 %
Brady Statistic AP VS Percent: 0.02 %
Brady Statistic AS VP Percent: 98.64 %
Brady Statistic AS VS Percent: 1.31 %
Brady Statistic RA Percent Paced: 0.05 %
Brady Statistic RV Percent Paced: 1.65 %
Date Time Interrogation Session: 20220517043824
HighPow Impedance: 90 Ohm
Implantable Lead Implant Date: 20150126
Implantable Lead Implant Date: 20150126
Implantable Lead Implant Date: 20150126
Implantable Lead Location: 753858
Implantable Lead Location: 753859
Implantable Lead Location: 753860
Implantable Lead Model: 4396
Implantable Lead Model: 5076
Implantable Lead Model: 6935
Implantable Pulse Generator Implant Date: 20150126
Lead Channel Impedance Value: 361 Ohm
Lead Channel Impedance Value: 418 Ohm
Lead Channel Impedance Value: 418 Ohm
Lead Channel Impedance Value: 418 Ohm
Lead Channel Impedance Value: 817 Ohm
Lead Channel Impedance Value: 988 Ohm
Lead Channel Pacing Threshold Amplitude: 0.625 V
Lead Channel Pacing Threshold Amplitude: 0.75 V
Lead Channel Pacing Threshold Amplitude: 1 V
Lead Channel Pacing Threshold Pulse Width: 0.4 ms
Lead Channel Pacing Threshold Pulse Width: 0.4 ms
Lead Channel Pacing Threshold Pulse Width: 0.4 ms
Lead Channel Sensing Intrinsic Amplitude: 1.375 mV
Lead Channel Sensing Intrinsic Amplitude: 1.375 mV
Lead Channel Sensing Intrinsic Amplitude: 7.875 mV
Lead Channel Sensing Intrinsic Amplitude: 7.875 mV
Lead Channel Setting Pacing Amplitude: 2 V
Lead Channel Setting Pacing Amplitude: 2 V
Lead Channel Setting Pacing Amplitude: 2.5 V
Lead Channel Setting Pacing Pulse Width: 0.4 ms
Lead Channel Setting Pacing Pulse Width: 0.4 ms
Lead Channel Setting Sensing Sensitivity: 0.3 mV

## 2020-11-05 ENCOUNTER — Other Ambulatory Visit: Payer: Medicare Other

## 2020-11-05 ENCOUNTER — Other Ambulatory Visit: Payer: Self-pay

## 2020-11-05 ENCOUNTER — Other Ambulatory Visit (HOSPITAL_COMMUNITY)
Admission: RE | Admit: 2020-11-05 | Discharge: 2020-11-05 | Disposition: A | Discharge status: Home / Self Care | Payer: Medicare Other | Source: Ambulatory Visit | Attending: Internal Medicine | Admitting: Internal Medicine

## 2020-11-05 DIAGNOSIS — Z113 Encounter for screening for infections with a predominantly sexual mode of transmission: Secondary | ICD-10-CM

## 2020-11-05 DIAGNOSIS — Z21 Asymptomatic human immunodeficiency virus [HIV] infection status: Secondary | ICD-10-CM

## 2020-11-06 LAB — URINE CYTOLOGY ANCILLARY ONLY
Chlamydia: NEGATIVE
Comment: NEGATIVE
Comment: NORMAL
Neisseria Gonorrhea: NEGATIVE

## 2020-11-06 LAB — T-HELPER CELL (CD4) - (RCID CLINIC ONLY)
CD4 % Helper T Cell: 34 % (ref 33–65)
CD4 T Cell Abs: 623 /uL (ref 400–1790)

## 2020-11-07 LAB — CBC WITH DIFFERENTIAL/PLATELET
Absolute Monocytes: 557 cells/uL (ref 200–950)
Basophils Absolute: 52 cells/uL (ref 0–200)
Basophils Relative: 0.9 %
Eosinophils Absolute: 70 cells/uL (ref 15–500)
Eosinophils Relative: 1.2 %
HCT: 42.5 % (ref 38.5–50.0)
Hemoglobin: 13.7 g/dL (ref 13.2–17.1)
Lymphs Abs: 1960 cells/uL (ref 850–3900)
MCH: 29.7 pg (ref 27.0–33.0)
MCHC: 32.2 g/dL (ref 32.0–36.0)
MCV: 92.2 fL (ref 80.0–100.0)
MPV: 9.9 fL (ref 7.5–12.5)
Monocytes Relative: 9.6 %
Neutro Abs: 3161 cells/uL (ref 1500–7800)
Neutrophils Relative %: 54.5 %
Platelets: 223 10*3/uL (ref 140–400)
RBC: 4.61 10*6/uL (ref 4.20–5.80)
RDW: 13.7 % (ref 11.0–15.0)
Total Lymphocyte: 33.8 %
WBC: 5.8 10*3/uL (ref 3.8–10.8)

## 2020-11-07 LAB — HIV-1 RNA QUANT-NO REFLEX-BLD
HIV 1 RNA Quant: NOT DETECTED Copies/mL
HIV-1 RNA Quant, Log: NOT DETECTED Log cps/mL

## 2020-11-07 LAB — COMPLETE METABOLIC PANEL WITH GFR
AG Ratio: 1.4 (calc) (ref 1.0–2.5)
ALT: 23 U/L (ref 9–46)
AST: 25 U/L (ref 10–35)
Albumin: 4.6 g/dL (ref 3.6–5.1)
Alkaline phosphatase (APISO): 75 U/L (ref 35–144)
BUN: 16 mg/dL (ref 7–25)
CO2: 23 mmol/L (ref 20–32)
Calcium: 9.6 mg/dL (ref 8.6–10.3)
Chloride: 107 mmol/L (ref 98–110)
Creat: 1.3 mg/dL (ref 0.70–1.33)
GFR, Est African American: 70 mL/min/{1.73_m2} (ref >=60)
GFR, Est Non African American: 61 mL/min/{1.73_m2} (ref >=60)
Globulin: 3.3 g/dL (calc) (ref 1.9–3.7)
Glucose, Bld: 94 mg/dL (ref 65–99)
Potassium: 4 mmol/L (ref 3.5–5.3)
Sodium: 142 mmol/L (ref 135–146)
Total Bilirubin: 0.4 mg/dL (ref 0.2–1.2)
Total Protein: 7.9 g/dL (ref 6.1–8.1)

## 2020-11-07 LAB — RPR: RPR Ser Ql: NONREACTIVE

## 2020-11-07 NOTE — Progress Notes (Signed)
Remote ICD transmission.   

## 2020-11-19 DIAGNOSIS — Z5309 Procedure and treatment not carried out because of other contraindication: Secondary | ICD-10-CM

## 2020-11-19 NOTE — Progress Notes (Signed)
[facility])                                            [facility] Quality Pharmacy Team                                        Statin Quality Measure Assessment    11/19/2020  GRAVES NIPP [birth year] [id number]  Per review of chart and payor information, this patient has been flagged for non-adherence to the following CMS Quality Measure:   [x]  Statin Use in Persons with Diabetes  []  Statin Use in Persons with Cardiovascular Disease  The 10-year ASCVD risk score DC Jr., et al., 2013) is: 8.4%   Values used to calculate the score:     Age: 55 years     Sex: Male     Is Non-Hispanic African American: Yes     Diabetic: No     Tobacco smoker: No     Systolic Blood Pressure: 110 mmHg     Is BP treated: Yes     HDL Cholesterol: 71 mg/dL     Total Cholesterol: 218 mg/dL  Per prior documentation, patient has a h/o hepatitis C and is being followed by infectious disease. On 05/01/2020, B18.2 (hepatitis C) and  K 74.60 (cirrhosis of liver without ascites, unspecified hepatic cirrhosis type ) were associated to office visit encounter. If clinically appropriate, please consider associating B18.2 and/or K74.9 to the encounter at the next office visit to remove this patient from the CMS measure.    Thank you for your time,  56, PharmD Clinical Pharmacist [facility] Cell: [phone]

## 2020-11-20 ENCOUNTER — Encounter: Payer: Self-pay | Admitting: Internal Medicine

## 2020-11-20 ENCOUNTER — Other Ambulatory Visit: Payer: Self-pay

## 2020-11-20 ENCOUNTER — Ambulatory Visit (INDEPENDENT_AMBULATORY_CARE_PROVIDER_SITE_OTHER): Payer: Medicare Other | Admitting: Internal Medicine

## 2020-11-20 VITALS — BP 141/98 | HR 85 | Temp 99.2°F | Wt 185.0 lb

## 2020-11-20 DIAGNOSIS — I428 Other cardiomyopathies: Secondary | ICD-10-CM

## 2020-11-20 DIAGNOSIS — Z21 Asymptomatic human immunodeficiency virus [HIV] infection status: Secondary | ICD-10-CM | POA: Diagnosis not present

## 2020-11-20 DIAGNOSIS — K7460 Unspecified cirrhosis of liver: Secondary | ICD-10-CM | POA: Diagnosis not present

## 2020-11-20 NOTE — Assessment & Plan Note (Signed)
He continues to do well on his current regimen and no changes indicated rtc in 6 months

## 2020-11-20 NOTE — Assessment & Plan Note (Signed)
He continues to take his appropriate medications.  Recently stopped lasix.

## 2020-11-20 NOTE — Progress Notes (Signed)
   Subjective:    Patient ID: [name], male    DOB: 07/02/1965, 55 y.o.   MRN: [id number]  HPI Here for follow up of HIV He continues on [name] and denies any missed doses.  He reports he is now separated from his husband of 38 years.  He has many friends he relies on for support.  He otherwise has no new complaints.  He has no issues with getting, taking or tolerating his medication. Labs done prior to the visit reassuring with a CD4 of623 and viral load remains not detected.     Review of Systems  Constitutional:  Negative for fatigue.  Gastrointestinal:  Negative for diarrhea and nausea.  Skin:  Negative for rash.      Objective:   Physical Exam Constitutional:      Appearance: Normal appearance.  Eyes:     General: No scleral icterus. Pulmonary:     Effort: Pulmonary effort is normal.  Skin:    Findings: No rash.  Neurological:     General: No focal deficit present.     Mental Status: He is alert.  Psychiatric:        Mood and Affect: Mood normal.   SH: lives alone now since his husband left       Assessment & Plan:

## 2020-11-20 NOTE — Assessment & Plan Note (Signed)
He has known cirrhosis and at risk for hepatocellular caricoma.  Will continue with twice yearly screening with ultrasound.

## 2020-11-21 ENCOUNTER — Ambulatory Visit (INDEPENDENT_AMBULATORY_CARE_PROVIDER_SITE_OTHER): Payer: Medicare Other

## 2020-11-21 DIAGNOSIS — I428 Other cardiomyopathies: Secondary | ICD-10-CM

## 2020-11-21 LAB — CUP PACEART REMOTE DEVICE CHECK
Battery Remaining Longevity: 1 mo
Battery Voltage: 2.73 V
Brady Statistic AP VP Percent: 0.04 %
Brady Statistic AP VS Percent: 0.02 %
Brady Statistic AS VP Percent: 98.62 %
Brady Statistic AS VS Percent: 1.32 %
Brady Statistic RA Percent Paced: 0.06 %
Brady Statistic RV Percent Paced: 1.87 %
Date Time Interrogation Session: 20220623044224
HighPow Impedance: 73 Ohm
Implantable Lead Implant Date: 20150126
Implantable Lead Implant Date: 20150126
Implantable Lead Implant Date: 20150126
Implantable Lead Location: 753858
Implantable Lead Location: 753859
Implantable Lead Location: 753860
Implantable Lead Model: 4396
Implantable Lead Model: 5076
Implantable Lead Model: 6935
Implantable Pulse Generator Implant Date: 20150126
Lead Channel Impedance Value: 342 Ohm
Lead Channel Impedance Value: 418 Ohm
Lead Channel Impedance Value: 418 Ohm
Lead Channel Impedance Value: 456 Ohm
Lead Channel Impedance Value: 722 Ohm
Lead Channel Impedance Value: 988 Ohm
Lead Channel Pacing Threshold Amplitude: 0.625 V
Lead Channel Pacing Threshold Amplitude: 0.875 V
Lead Channel Pacing Threshold Amplitude: 1.375 V
Lead Channel Pacing Threshold Pulse Width: 0.4 ms
Lead Channel Pacing Threshold Pulse Width: 0.4 ms
Lead Channel Pacing Threshold Pulse Width: 0.4 ms
Lead Channel Sensing Intrinsic Amplitude: 1.25 mV
Lead Channel Sensing Intrinsic Amplitude: 1.25 mV
Lead Channel Sensing Intrinsic Amplitude: 8.25 mV
Lead Channel Sensing Intrinsic Amplitude: 8.25 mV
Lead Channel Setting Pacing Amplitude: 2 V
Lead Channel Setting Pacing Amplitude: 2 V
Lead Channel Setting Pacing Amplitude: 2.5 V
Lead Channel Setting Pacing Pulse Width: 0.4 ms
Lead Channel Setting Pacing Pulse Width: 0.4 ms
Lead Channel Setting Sensing Sensitivity: 0.3 mV

## 2020-12-10 NOTE — Progress Notes (Signed)
Remote ICD transmission.   

## 2020-12-11 ENCOUNTER — Ambulatory Visit
Admission: RE | Admit: 2020-12-11 | Discharge: 2020-12-11 | Disposition: A | Discharge status: Home / Self Care | Payer: Medicare Other | Source: Ambulatory Visit | Attending: Internal Medicine | Admitting: Internal Medicine

## 2020-12-11 ENCOUNTER — Other Ambulatory Visit: Payer: Self-pay

## 2020-12-11 DIAGNOSIS — K7460 Unspecified cirrhosis of liver: Secondary | ICD-10-CM

## 2020-12-11 DIAGNOSIS — K8020 Calculus of gallbladder without cholecystitis without obstruction: Secondary | ICD-10-CM | POA: Diagnosis not present

## 2020-12-23 ENCOUNTER — Ambulatory Visit (INDEPENDENT_AMBULATORY_CARE_PROVIDER_SITE_OTHER): Payer: Medicare Other

## 2020-12-23 DIAGNOSIS — I428 Other cardiomyopathies: Secondary | ICD-10-CM

## 2020-12-25 LAB — CUP PACEART REMOTE DEVICE CHECK
Battery Remaining Longevity: 1 mo
Battery Voltage: 2.73 V
Brady Statistic AP VP Percent: 0.04 %
Brady Statistic AP VS Percent: 0.02 %
Brady Statistic AS VP Percent: 98.61 %
Brady Statistic AS VS Percent: 1.34 %
Brady Statistic RA Percent Paced: 0.05 %
Brady Statistic RV Percent Paced: 3.04 %
Date Time Interrogation Session: 20220725022604
HighPow Impedance: 85 Ohm
Implantable Lead Implant Date: 20150126
Implantable Lead Implant Date: 20150126
Implantable Lead Implant Date: 20150126
Implantable Lead Location: 753858
Implantable Lead Location: 753859
Implantable Lead Location: 753860
Implantable Lead Model: 4396
Implantable Lead Model: 5076
Implantable Lead Model: 6935
Implantable Pulse Generator Implant Date: 20150126
Lead Channel Impedance Value: 1121 Ohm
Lead Channel Impedance Value: 361 Ohm
Lead Channel Impedance Value: 418 Ohm
Lead Channel Impedance Value: 475 Ohm
Lead Channel Impedance Value: 513 Ohm
Lead Channel Impedance Value: 836 Ohm
Lead Channel Pacing Threshold Amplitude: 0.625 V
Lead Channel Pacing Threshold Amplitude: 0.75 V
Lead Channel Pacing Threshold Amplitude: 1.5 V
Lead Channel Pacing Threshold Pulse Width: 0.4 ms
Lead Channel Pacing Threshold Pulse Width: 0.4 ms
Lead Channel Pacing Threshold Pulse Width: 0.4 ms
Lead Channel Sensing Intrinsic Amplitude: 1.5 mV
Lead Channel Sensing Intrinsic Amplitude: 1.5 mV
Lead Channel Sensing Intrinsic Amplitude: 8.75 mV
Lead Channel Sensing Intrinsic Amplitude: 8.75 mV
Lead Channel Setting Pacing Amplitude: 2 V
Lead Channel Setting Pacing Amplitude: 2 V
Lead Channel Setting Pacing Amplitude: 2.5 V
Lead Channel Setting Pacing Pulse Width: 0.4 ms
Lead Channel Setting Pacing Pulse Width: 0.4 ms
Lead Channel Setting Sensing Sensitivity: 0.3 mV

## 2020-12-26 ENCOUNTER — Other Ambulatory Visit: Payer: Self-pay | Admitting: Internal Medicine

## 2020-12-26 ENCOUNTER — Other Ambulatory Visit: Payer: Self-pay | Admitting: Student

## 2020-12-26 DIAGNOSIS — G4700 Insomnia, unspecified: Secondary | ICD-10-CM

## 2020-12-30 ENCOUNTER — Telehealth: Payer: Self-pay

## 2020-12-30 DIAGNOSIS — I472 Ventricular tachycardia, unspecified: Secondary | ICD-10-CM

## 2020-12-30 DIAGNOSIS — I428 Other cardiomyopathies: Secondary | ICD-10-CM

## 2020-12-30 NOTE — Telephone Encounter (Signed)
Care alert received. Device has reached RRT 7/31  LOV 07/08/20, Gen changeout was discussed.    Spoke with pt, advised of RRT status.  Will forward message to Dr. Lubertha Basque nurse to contact pt regarding procedure.

## 2020-12-31 NOTE — Telephone Encounter (Signed)
Pt is scheduled for BIV ICD gen change on 01/24/2021 at 1:30 pm  Will get lab work on 01/20/21.  Work up complete.

## 2021-01-15 NOTE — Addendum Note (Signed)
Addended by: Elease Etienne A on: 01/15/2021 03:36 PM   Modules accepted: Level of Service

## 2021-01-15 NOTE — Progress Notes (Signed)
Remote ICD transmission.   

## 2021-01-16 ENCOUNTER — Other Ambulatory Visit: Payer: Self-pay | Admitting: Family Medicine

## 2021-01-16 DIAGNOSIS — R0981 Nasal congestion: Secondary | ICD-10-CM

## 2021-01-16 DIAGNOSIS — J3489 Other specified disorders of nose and nasal sinuses: Secondary | ICD-10-CM

## 2021-01-20 ENCOUNTER — Other Ambulatory Visit: Payer: Medicare Other

## 2021-01-20 ENCOUNTER — Other Ambulatory Visit: Payer: Self-pay

## 2021-01-20 DIAGNOSIS — I472 Ventricular tachycardia, unspecified: Secondary | ICD-10-CM

## 2021-01-20 DIAGNOSIS — I428 Other cardiomyopathies: Secondary | ICD-10-CM

## 2021-01-20 LAB — CBC WITH DIFFERENTIAL/PLATELET
Basophils Absolute: 0 10*3/uL (ref 0.0–0.2)
Basos: 1 %
EOS (ABSOLUTE): 0 10*3/uL (ref 0.0–0.4)
Eos: 1 %
Hematocrit: 40.7 % (ref 37.5–51.0)
Hemoglobin: 13.9 g/dL (ref 13.0–17.7)
Lymphocytes Absolute: 2 10*3/uL (ref 0.7–3.1)
Lymphs: 26 %
MCH: 30.7 pg (ref 26.6–33.0)
MCHC: 34.2 g/dL (ref 31.5–35.7)
MCV: 90 fL (ref 79–97)
Monocytes Absolute: 0.5 10*3/uL (ref 0.1–0.9)
Monocytes: 7 %
Neutrophils Absolute: 5 10*3/uL (ref 1.4–7.0)
Neutrophils: 65 %
Platelets: 219 10*3/uL (ref 150–450)
RBC: 4.53 x10E6/uL (ref 4.14–5.80)
RDW: 13.8 % (ref 11.6–15.4)
WBC: 7.6 10*3/uL (ref 3.4–10.8)

## 2021-01-20 LAB — BASIC METABOLIC PANEL
BUN/Creatinine Ratio: 18 (ref 9–20)
BUN: 18 mg/dL (ref 6–24)
CO2: 19 mmol/L — ABNORMAL LOW (ref 20–29)
Calcium: 10.3 mg/dL — ABNORMAL HIGH (ref 8.7–10.2)
Chloride: 108 mmol/L — ABNORMAL HIGH (ref 96–106)
Creatinine, Ser: 0.99 mg/dL (ref 0.76–1.27)
Glucose: 107 mg/dL — ABNORMAL HIGH (ref 65–99)
Potassium: 4.4 mmol/L (ref 3.5–5.2)
Sodium: 139 mmol/L (ref 134–144)
eGFR: 89 mL/min/{1.73_m2} (ref >59)

## 2021-01-23 NOTE — Pre-Procedure Instructions (Signed)
Attempted to call patient regarding procedure instructions.  Left voicemail on the following items: Arrival time 1130 Nothing to eat or drink after midnight No meds AM of procedure Responsible person to drive you home and stay with you for 24 hrs Wash with special soap night before and morning of procedure  

## 2021-01-24 ENCOUNTER — Encounter (HOSPITAL_COMMUNITY)
Admission: RE | Disposition: A | Discharge status: Home / Self Care | Payer: Self-pay | Source: Home / Self Care | Attending: Internal Medicine

## 2021-01-24 ENCOUNTER — Other Ambulatory Visit: Payer: Self-pay

## 2021-01-24 ENCOUNTER — Ambulatory Visit (HOSPITAL_COMMUNITY)
Admission: RE | Admit: 2021-01-24 | Discharge: 2021-01-24 | Disposition: A | Discharge status: Home / Self Care | Payer: Medicare Other | Attending: Internal Medicine | Admitting: Internal Medicine

## 2021-01-24 DIAGNOSIS — I447 Left bundle-branch block, unspecified: Secondary | ICD-10-CM | POA: Diagnosis not present

## 2021-01-24 DIAGNOSIS — I428 Other cardiomyopathies: Secondary | ICD-10-CM | POA: Diagnosis not present

## 2021-01-24 DIAGNOSIS — I472 Ventricular tachycardia: Secondary | ICD-10-CM | POA: Diagnosis not present

## 2021-01-24 DIAGNOSIS — F1010 Alcohol abuse, uncomplicated: Secondary | ICD-10-CM | POA: Insufficient documentation

## 2021-01-24 DIAGNOSIS — Z4502 Encounter for adjustment and management of automatic implantable cardiac defibrillator: Secondary | ICD-10-CM | POA: Diagnosis not present

## 2021-01-24 DIAGNOSIS — Z79899 Other long term (current) drug therapy: Secondary | ICD-10-CM | POA: Diagnosis not present

## 2021-01-24 DIAGNOSIS — Z881 Allergy status to other antibiotic agents status: Secondary | ICD-10-CM | POA: Insufficient documentation

## 2021-01-24 DIAGNOSIS — I5022 Chronic systolic (congestive) heart failure: Secondary | ICD-10-CM | POA: Diagnosis not present

## 2021-01-24 DIAGNOSIS — I110 Hypertensive heart disease with heart failure: Secondary | ICD-10-CM | POA: Diagnosis not present

## 2021-01-24 HISTORY — PX: BIV ICD GENERATOR CHANGEOUT: EP1194

## 2021-01-24 SURGERY — BIV ICD GENERATOR CHANGEOUT

## 2021-01-24 MED ORDER — LIDOCAINE HCL (PF) 1 % IJ SOLN
INTRAMUSCULAR | Status: DC | PRN
  Administered 2021-01-24: 60 mL

## 2021-01-24 MED ORDER — MIDAZOLAM HCL 5 MG/5ML IJ SOLN
INTRAMUSCULAR | Status: DC | PRN
  Administered 2021-01-24: 2 mg via INTRAVENOUS
  Administered 2021-01-24: 1 mg via INTRAVENOUS

## 2021-01-24 MED ORDER — POVIDONE-IODINE 10 % EX SWAB
2.0000 | Freq: Once | CUTANEOUS | Status: DC
Start: 2021-01-24 — End: 2021-01-24

## 2021-01-24 MED ORDER — MIDAZOLAM HCL 5 MG/5ML IJ SOLN
INTRAMUSCULAR | Status: AC
  Filled 2021-01-24: qty 5

## 2021-01-24 MED ORDER — SODIUM CHLORIDE 0.9 % IV SOLN
INTRAVENOUS | Status: AC
  Filled 2021-01-24: qty 2

## 2021-01-24 MED ORDER — ACETAMINOPHEN 325 MG PO TABS
325.0000 mg | ORAL_TABLET | ORAL | Status: DC | PRN
  Filled 2021-01-24: qty 2

## 2021-01-24 MED ORDER — CHLORHEXIDINE GLUCONATE 4 % EX LIQD
4.0000 "application " | Freq: Once | CUTANEOUS | Status: DC
  Filled 2021-01-24: qty 60

## 2021-01-24 MED ORDER — CEFAZOLIN SODIUM-DEXTROSE 2-4 GM/100ML-% IV SOLN
2.0000 g | INTRAVENOUS | Status: AC
  Administered 2021-01-24: 2 g via INTRAVENOUS
  Filled 2021-01-24: qty 100

## 2021-01-24 MED ORDER — SODIUM CHLORIDE 0.9 % IV SOLN: INTRAVENOUS | Status: DC

## 2021-01-24 MED ORDER — ONDANSETRON HCL 4 MG/2ML IJ SOLN: 4.0000 mg | Freq: Four times a day (QID) | INTRAMUSCULAR | Status: DC | PRN

## 2021-01-24 MED ORDER — CEFAZOLIN SODIUM-DEXTROSE 2-4 GM/100ML-% IV SOLN
INTRAVENOUS | Status: AC
  Filled 2021-01-24: qty 100

## 2021-01-24 MED ORDER — FENTANYL CITRATE (PF) 100 MCG/2ML IJ SOLN
INTRAMUSCULAR | Status: DC | PRN
  Administered 2021-01-24: 25 ug via INTRAVENOUS
  Administered 2021-01-24: 12.5 ug via INTRAVENOUS

## 2021-01-24 MED ORDER — LIDOCAINE HCL (PF) 1 % IJ SOLN
INTRAMUSCULAR | Status: AC
  Filled 2021-01-24: qty 60

## 2021-01-24 MED ORDER — SODIUM CHLORIDE 0.9 % IV SOLN
INTRAVENOUS | Status: DC | PRN
  Administered 2021-01-24: 500 mL

## 2021-01-24 MED ORDER — HEPARIN (PORCINE) IN NACL 1000-0.9 UT/500ML-% IV SOLN
INTRAVENOUS | Status: DC | PRN
Start: 2021-01-24 — End: 2021-01-24

## 2021-01-24 MED ORDER — FENTANYL CITRATE (PF) 100 MCG/2ML IJ SOLN
INTRAMUSCULAR | Status: AC
  Filled 2021-01-24: qty 2

## 2021-01-24 MED ORDER — SODIUM CHLORIDE 0.9 % IV SOLN: 80.0000 mg | INTRAVENOUS | Status: DC

## 2021-01-24 SURGICAL SUPPLY — 4 items
CABLE SURGICAL S-101-97-12 (CABLE) ×2 IMPLANT
ICD CLARIA MRI DTMA1D1 (ICD Generator) ×1 IMPLANT
PAD PRO RADIOLUCENT 2001M-C (PAD) ×2 IMPLANT
TRAY PACEMAKER INSERTION (PACKS) ×2 IMPLANT

## 2021-01-24 NOTE — Discharge Instructions (Signed)

## 2021-01-24 NOTE — H&P (Signed)
HPI [name] returns today for followup. He is a pleasant middle aged man with chronic systolic heart failure and VT, prior ETOH abuse, s/p ICD insertion. His VT has been well controlled on amiodarone 100 mg daily and with cessation of ETOH abuse. He has not had syncope. He admits to dietary indiscretion and has not been working out. He has gained over 30 lbs in the past 5 years but only 3 pounds in the past year. He denies chest pain or sob.      Allergies  Allergen Reactions   Bactrim Rash              Current Outpatient Medications  Medication Sig Dispense Refill   amiodarone (PACERONE) 200 MG tablet Take 100 mg by mouth daily.       carvedilol (COREG) 25 MG tablet Take 1 tablet (25 mg total) by mouth 2 (two) times daily with a meal. 180 tablet 3   clobetasol ointment (TEMOVATE) 0.05 % Apply 1 application topically 2 (two) times daily.   0   colchicine 0.6 MG tablet TAKE 1 TABLET(0.6 MG) BY MOUTH TWICE DAILY AS NEEDED FOR GOUT FLARES 30 tablet 6   diclofenac sodium (VOLTAREN) 1 % GEL Apply 2 g topically 4 (four) times daily. 100 g 11   dolutegravir (TIVICAY) 50 MG tablet 1 tab daily 30 tablet 11   doxepin (SINEQUAN) 25 MG capsule Take 1 capsule by mouth as needed.   5   emtricitabine-rilpivir-tenofovir AF (ODEFSEY) 200-25-25 MG TABS tablet Take 1 tablet by mouth daily. 30 tablet 11   ENTRESTO 97-103 MG TAKE 1 TABLET BY MOUTH TWICE DAILY 60 tablet 3   fluticasone (FLONASE) 50 MCG/ACT nasal spray Place 2 sprays into both nostrils daily. 16 g 6   furosemide (LASIX) 40 MG tablet TAKE 1 TABLET BY MOUTH EVERY DAY AS NEEDED 90 tablet 1   gabapentin (NEURONTIN) 300 MG capsule Take 300 mg by mouth 3 (three) times daily.       hydrALAZINE (APRESOLINE) 50 MG tablet Take 1 tablet (50 mg total) by mouth 3 (three) times daily. Do not take if your systolic blood pressure is less that 120. 120 tablet 11   hydrOXYzine (ATARAX/VISTARIL) 50 MG tablet take 1 tablet by mouth three times a day if  needed 15 tablet 1   mirtazapine (REMERON) 7.5 MG tablet TAKE 1 TABLET(7.5 MG) BY MOUTH AT BEDTIME 30 tablet 1   ondansetron (ZOFRAN) 4 MG tablet Take 4 mg by mouth as needed.   0   spironolactone (ALDACTONE) 25 MG tablet TAKE 1 TABLET(25 MG) BY MOUTH AT BEDTIME 30 tablet 3   triamcinolone cream (KENALOG) 0.1 % Apply 1 application topically 3 (three) times daily.   0    No current facility-administered medications for this visit.            Past Medical History:  Diagnosis Date   CHF (congestive heart failure) (HCC)     Chronic systolic heart failure (HCC)      a. Echo 12/05/11:  EF 20-25%, diff HK with mild sparing of IL wall, mild AI, mod MR, mild LAE, mild RVE, mild reduced RVF.;  b.  Echo 05/11/2012:  Mild LVH, EF 20-25%, Gr 1 diast dysfn, mod MR, mild LAE   Depression     G6PD deficiency     GERD (gastroesophageal reflux disease)     Hepatitis      Hep B and Hep C (patient does not report  this but these are listed in previous notes.)   HIV infection (HCC)     Hypertension     NICM (nonischemic cardiomyopathy) (HCC)      cardia CTA 8/13 negative for obstructive CAD   Presence of permanent cardiac pacemaker     VT (ventricular tachycardia) (HCC)        ROS:    All systems reviewed and negative except as noted in the HPI.          Past Surgical History:  Procedure Laterality Date   BI-VENTRICULAR PACEMAKER INSERTION N/A 06/26/2013    Procedure: BI-VENTRICULAR PACEMAKER INSERTION (CRT-P);  Surgeon: Marinus Maw, MD;  Location: [facility] CATH LAB;  Service: Cardiovascular;  Laterality: N/A;   COLONOSCOPY WITH PROPOFOL N/A 08/14/2016    Procedure: COLONOSCOPY WITH PROPOFOL;  Surgeon: Jeani Hawking, MD;  Location: [facility] ENDOSCOPY;  Service: Endoscopy;  Laterality: N/A;   ELECTROPHYSIOLOGY STUDY N/A 02/19/2012    Procedure: ELECTROPHYSIOLOGY STUDY;  Surgeon: Marinus Maw, MD;  Location: [facility] CATH LAB;  Service: Cardiovascular;  Laterality: N/A;   ESOPHAGOGASTRODUODENOSCOPY   12/07/2011     Procedure: ESOPHAGOGASTRODUODENOSCOPY (EGD);  Surgeon: [name], MD,FACG;  Location: [facility] ENDOSCOPY;  Service: Endoscopy;  Laterality: N/A;   FINGER SURGERY        Thumb laceration.     HARDWARE REMOVAL Right 07/01/2018    Procedure: HARDWARE REMOVAL;  Surgeon: Sheral Apley, MD;  Location: [facility];  Service: Orthopedics;  Laterality: Right;   INCISION AND DRAINAGE Right 07/01/2018    Procedure: INCISION AND DRAINAGE;  Surgeon: Sheral Apley, MD;  Location: [facility];  Service: Orthopedics;  Laterality: Right;   LEFT HEART CATHETERIZATION WITH CORONARY ANGIOGRAM N/A 01/31/2014    Procedure: LEFT HEART CATHETERIZATION WITH CORONARY ANGIOGRAM;  Surgeon: Iran Ouch, MD;  Location: [facility] CATH LAB;  Service: Cardiovascular;  Laterality: N/A;   ORIF ANKLE FRACTURE Right 01/26/2017    Procedure: OPEN REDUCTION INTERNAL FIXATION (ORIF) ANKLE FRACTURE;  Surgeon: Sheral Apley, MD;  Location: [facility] OR;  Service: Orthopedics;  Laterality: Right;             Family History  Problem Relation Age of Onset   Lung cancer Mother     Colon cancer Other 69        Social History         Socioeconomic History   Marital status: Single      Spouse name: Not on file   Number of children: Not on file   Years of education: Not on file   Highest education level: Not on file  Occupational History   Occupation: Unemployed  Tobacco Use   Smoking status: Never Smoker   Smokeless tobacco: Never Used  Vaping Use   Vaping Use: Never used  Substance and Sexual Activity   Alcohol use: Yes      Alcohol/week: 1.0 standard drink      Types: 1 Cans of beer per week      Comment: occ beer   Drug use: Yes      Frequency: 7.0 times per week      Types: Marijuana      Comment: 1x/week, smoked day before surgery   Sexual activity: Yes      Partners: Male      Comment:  declined condoms 05/2019  Other Topics Concern   Not on file  Social History Narrative    Lives  alone.  Drinks beer daily.  Liquor rarely.  He occasionally smokes marijuana..    Social Determinants of Health    Financial Resource Strain: Not on file  Food Insecurity: Not on file  Transportation Needs: Not on file  Physical Activity: Not on file  Stress: Not on file  Social Connections: Not on file  Intimate Partner Violence: Not on file        BP (!) 146/94   Pulse 83   Ht 5\' 4"  (1.626 m)   Wt 187 lb (84.8 kg)   SpO2 96%   BMI 32.10 kg/m    Physical Exam:   Well appearing NAD HEENT: Unremarkable Neck:  6 cm JVD, no thyromegally Lymphatics:  No adenopathy Back:  No CVA tenderness Lungs:  Clear with no wheezes HEART:  Regular rate rhythm, no murmurs, no rubs, no clicks Abd:  soft, positive bowel sounds, no organomegally, no rebound, no guarding Ext:  2 plus pulses, no edema, no cyanosis, no clubbing Skin:  No rashes no nodules Neuro:  CN II through XII intact, motor grossly intact   EKG - nsr with biv pacing   DEVICE  Normal device function.  See PaceArt for details.    Assess/Plan: 1. Chronic systolic heart failure - his symptoms remain class 2. He will continue his current meds.  2. VT - he has maintained NSR on low dose amiodarone  3. ETOH abuse - he has not consumed excess ETOH. He drinks rarely. I encouraged him to never drink more than 2 beers a day or 7 in a week. 4. ICD - his medtronic Biv ICD is working normally. He is less than a year out from ERI.    EP Attending  Since his prior clinic visit, he has reached ERI on his ICD. He has a h/o incessant VT and improvement in his heart failure. He has gained weight and will need to work on this. I have reviewed the indications/risks/benefits/goals/expectations of ICD gen change and he wishes to proceed.   Dorathy Daft Cassidee Deats,MD

## 2021-01-25 ENCOUNTER — Other Ambulatory Visit (HOSPITAL_COMMUNITY): Payer: Self-pay | Admitting: Internal Medicine

## 2021-01-26 ENCOUNTER — Encounter (HOSPITAL_COMMUNITY): Payer: Self-pay | Admitting: Internal Medicine

## 2021-02-05 ENCOUNTER — Ambulatory Visit (INDEPENDENT_AMBULATORY_CARE_PROVIDER_SITE_OTHER): Payer: Medicare Other

## 2021-02-05 ENCOUNTER — Other Ambulatory Visit: Payer: Self-pay

## 2021-02-05 DIAGNOSIS — I4901 Ventricular fibrillation: Secondary | ICD-10-CM

## 2021-02-05 DIAGNOSIS — I428 Other cardiomyopathies: Secondary | ICD-10-CM

## 2021-02-05 LAB — CUP PACEART INCLINIC DEVICE CHECK
Battery Remaining Longevity: 90 mo
Battery Voltage: 3.12 V
Brady Statistic AP VP Percent: 0.07 %
Brady Statistic AP VS Percent: 0.02 %
Brady Statistic AS VP Percent: 98.52 %
Brady Statistic AS VS Percent: 1.39 %
Brady Statistic RA Percent Paced: 0.09 %
Brady Statistic RV Percent Paced: 0.39 %
Date Time Interrogation Session: 20220907133659
HighPow Impedance: 73 Ohm
Implantable Lead Implant Date: 20150126
Implantable Lead Implant Date: 20150126
Implantable Lead Implant Date: 20150126
Implantable Lead Location: 753858
Implantable Lead Location: 753859
Implantable Lead Location: 753860
Implantable Lead Model: 4396
Implantable Lead Model: 5076
Implantable Lead Model: 6935
Implantable Pulse Generator Implant Date: 20220826
Lead Channel Impedance Value: 323 Ohm
Lead Channel Impedance Value: 342 Ohm
Lead Channel Impedance Value: 437 Ohm
Lead Channel Impedance Value: 437 Ohm
Lead Channel Impedance Value: 722 Ohm
Lead Channel Impedance Value: 836 Ohm
Lead Channel Pacing Threshold Amplitude: 0.5 V
Lead Channel Pacing Threshold Amplitude: 0.75 V
Lead Channel Pacing Threshold Amplitude: 1.25 V
Lead Channel Pacing Threshold Pulse Width: 0.4 ms
Lead Channel Pacing Threshold Pulse Width: 0.4 ms
Lead Channel Pacing Threshold Pulse Width: 0.4 ms
Lead Channel Sensing Intrinsic Amplitude: 1.5 mV
Lead Channel Sensing Intrinsic Amplitude: 12 mV
Lead Channel Setting Pacing Amplitude: 1.5 V
Lead Channel Setting Pacing Amplitude: 2 V
Lead Channel Setting Pacing Amplitude: 2.75 V
Lead Channel Setting Pacing Pulse Width: 0.4 ms
Lead Channel Setting Pacing Pulse Width: 0.4 ms
Lead Channel Setting Sensing Sensitivity: 0.3 mV

## 2021-02-05 NOTE — Progress Notes (Signed)
Wound check appointment s/p Bi-V ICD gen change 01/24/21. Steri-strips removed. Wound without redness or edema. Incision edges approximated, wound well healed. Normal device function. Bi-V effective pacing 98.5%. Thresholds, sensing, and impedances consistent with implant measurements. Device programmed for chronic lead safety margins. Histogram distribution appropriate for patient and level of activity. No mode switches or ventricular arrhythmias noted. Patient educated about wound care, arm mobility, lifting restrictions, shock plan. Patient enrolled in remote monitoring with next transmission scheduled 04/29/21. 91 day post gen change follow up with Dr. Ladona Ridgel 04/29/21.

## 2021-02-05 NOTE — Patient Instructions (Signed)
   After Your ICD (Implantable Cardiac Defibrillator)    Monitor your defibrillator site for redness, swelling, and drainage. Call the device clinic at [phone] if you experience these symptoms or fever/chills.  Your incision was closed with Steri-strips or staples:  You may shower 7 days after your procedure and wash your incision with soap and water. Avoid lotions, ointments, or perfumes over your incision until it is well-healed.    You may use a hot tub or a pool after your wound check appointment if the incision is completely closed.  Do not lift, push or pull greater than 10 pounds with the affected arm until 6 weeks after your procedure. There are no other restrictions in arm movement after your wound check appointment.  Your ICD is MRI compatible.  Your ICD is designed to protect you from life threatening heart rhythms. Because of this, you may receive a shock.   1 shock with no symptoms:  Call the office during business hours. 1 shock with symptoms (chest pain, chest pressure, dizziness, lightheadedness, shortness of breath, overall feeling unwell):  Call 911. If you experience 2 or more shocks in 24 hours:  Call 911. If you receive a shock, you should not drive.  [place] DMV - no driving for 6 months if you receive appropriate therapy from your ICD.   ICD Alerts:  Some alerts are vibratory and others beep. These are NOT emergencies. Please call our office to let us know. If this occurs at night or on weekends, it can wait until the next business day. Send a remote transmission.  If your device is capable of reading fluid status (for heart failure), you will be offered monthly monitoring to review this with you.   Remote monitoring is used to monitor your ICD from home. This monitoring is scheduled every 91 days by our office. It allows us to keep an eye on the functioning of your device to ensure it is working properly. You will routinely see your Electrophysiologist annually  (more often if necessary).   

## 2021-03-06 DIAGNOSIS — L280 Lichen simplex chronicus: Secondary | ICD-10-CM | POA: Diagnosis not present

## 2021-03-06 DIAGNOSIS — L281 Prurigo nodularis: Secondary | ICD-10-CM | POA: Diagnosis not present

## 2021-03-06 DIAGNOSIS — L299 Pruritus, unspecified: Secondary | ICD-10-CM | POA: Diagnosis not present

## 2021-03-07 ENCOUNTER — Telehealth: Payer: Self-pay

## 2021-03-07 NOTE — Telephone Encounter (Signed)
Patient left voicemail requesting refill for colchicine, says the pharmacy is only giving him a 15 day supply. Called patient back.   Advised that he reach out to his PCP to take over this maintenance medication. He became upset and yelled that he wanted Dr. Luciana Axe to continue prescribing the medication he has always prescribed him.   After speaking some more, he has 4 refills left on the medication through the end of the year. He says he has only been needing 1 pill a day and that the pharmacy gave him 30 tablets. Explained that this should be a 30 day supply and suggested he reach out to his pharmacy to request a refill.   Per chart, sporadic refill pattern of 05/2020, 09/2020, and 12/2020.   Sandie Ano, RN

## 2021-03-24 ENCOUNTER — Ambulatory Visit: Payer: Medicare Other

## 2021-03-24 DIAGNOSIS — M25571 Pain in right ankle and joints of right foot: Secondary | ICD-10-CM | POA: Diagnosis not present

## 2021-03-25 DIAGNOSIS — M79671 Pain in right foot: Secondary | ICD-10-CM | POA: Diagnosis not present

## 2021-03-25 DIAGNOSIS — M722 Plantar fascial fibromatosis: Secondary | ICD-10-CM | POA: Diagnosis not present

## 2021-03-26 ENCOUNTER — Other Ambulatory Visit: Payer: Self-pay

## 2021-03-26 ENCOUNTER — Emergency Department (HOSPITAL_COMMUNITY): Payer: Medicare Other

## 2021-03-26 ENCOUNTER — Other Ambulatory Visit (HOSPITAL_COMMUNITY): Payer: Self-pay | Admitting: *Deleted

## 2021-03-26 ENCOUNTER — Inpatient Hospital Stay (HOSPITAL_COMMUNITY)
Admission: EM | Admit: 2021-03-26 | Discharge: 2021-03-30 | DRG: 554 | Disposition: A | Discharge status: Home / Self Care | Payer: Medicare Other | Source: Ambulatory Visit | Attending: Internal Medicine | Admitting: Internal Medicine

## 2021-03-26 ENCOUNTER — Encounter: Payer: Self-pay | Admitting: Internal Medicine

## 2021-03-26 ENCOUNTER — Telehealth: Payer: Self-pay | Admitting: Internal Medicine

## 2021-03-26 ENCOUNTER — Inpatient Hospital Stay: Admission: AD | Admit: 2021-03-26 | Payer: Medicare Other | Source: Ambulatory Visit | Admitting: Internal Medicine

## 2021-03-26 DIAGNOSIS — Z79899 Other long term (current) drug therapy: Secondary | ICD-10-CM

## 2021-03-26 DIAGNOSIS — M1A9XX0 Chronic gout, unspecified, without tophus (tophi): Secondary | ICD-10-CM | POA: Diagnosis not present

## 2021-03-26 DIAGNOSIS — B20 Human immunodeficiency virus [HIV] disease: Secondary | ICD-10-CM | POA: Diagnosis not present

## 2021-03-26 DIAGNOSIS — I5022 Chronic systolic (congestive) heart failure: Secondary | ICD-10-CM | POA: Diagnosis present

## 2021-03-26 DIAGNOSIS — Z20822 Contact with and (suspected) exposure to covid-19: Secondary | ICD-10-CM | POA: Diagnosis present

## 2021-03-26 DIAGNOSIS — R600 Localized edema: Secondary | ICD-10-CM | POA: Diagnosis not present

## 2021-03-26 DIAGNOSIS — Z21 Asymptomatic human immunodeficiency virus [HIV] infection status: Secondary | ICD-10-CM | POA: Diagnosis present

## 2021-03-26 DIAGNOSIS — Z800 Family history of malignant neoplasm of digestive organs: Secondary | ICD-10-CM | POA: Diagnosis not present

## 2021-03-26 DIAGNOSIS — M65871 Other synovitis and tenosynovitis, right ankle and foot: Secondary | ICD-10-CM | POA: Diagnosis not present

## 2021-03-26 DIAGNOSIS — Z23 Encounter for immunization: Secondary | ICD-10-CM

## 2021-03-26 DIAGNOSIS — F1010 Alcohol abuse, uncomplicated: Secondary | ICD-10-CM | POA: Diagnosis present

## 2021-03-26 DIAGNOSIS — I110 Hypertensive heart disease with heart failure: Secondary | ICD-10-CM | POA: Diagnosis present

## 2021-03-26 DIAGNOSIS — Z95810 Presence of automatic (implantable) cardiac defibrillator: Secondary | ICD-10-CM | POA: Diagnosis not present

## 2021-03-26 DIAGNOSIS — Z7984 Long term (current) use of oral hypoglycemic drugs: Secondary | ICD-10-CM

## 2021-03-26 DIAGNOSIS — I10 Essential (primary) hypertension: Secondary | ICD-10-CM | POA: Diagnosis not present

## 2021-03-26 DIAGNOSIS — Z801 Family history of malignant neoplasm of trachea, bronchus and lung: Secondary | ICD-10-CM

## 2021-03-26 DIAGNOSIS — I428 Other cardiomyopathies: Secondary | ICD-10-CM | POA: Diagnosis present

## 2021-03-26 DIAGNOSIS — K219 Gastro-esophageal reflux disease without esophagitis: Secondary | ICD-10-CM | POA: Diagnosis not present

## 2021-03-26 DIAGNOSIS — Z882 Allergy status to sulfonamides status: Secondary | ICD-10-CM

## 2021-03-26 DIAGNOSIS — F32A Depression, unspecified: Secondary | ICD-10-CM | POA: Diagnosis not present

## 2021-03-26 DIAGNOSIS — I472 Ventricular tachycardia, unspecified: Secondary | ICD-10-CM | POA: Diagnosis not present

## 2021-03-26 DIAGNOSIS — M009 Pyogenic arthritis, unspecified: Secondary | ICD-10-CM | POA: Diagnosis not present

## 2021-03-26 DIAGNOSIS — M7989 Other specified soft tissue disorders: Secondary | ICD-10-CM | POA: Diagnosis not present

## 2021-03-26 DIAGNOSIS — M12571 Traumatic arthropathy, right ankle and foot: Secondary | ICD-10-CM | POA: Diagnosis not present

## 2021-03-26 DIAGNOSIS — M10071 Idiopathic gout, right ankle and foot: Secondary | ICD-10-CM | POA: Diagnosis not present

## 2021-03-26 DIAGNOSIS — M722 Plantar fascial fibromatosis: Secondary | ICD-10-CM | POA: Diagnosis not present

## 2021-03-26 DIAGNOSIS — M00871 Arthritis due to other bacteria, right ankle and foot: Secondary | ICD-10-CM | POA: Diagnosis not present

## 2021-03-26 DIAGNOSIS — M109 Gout, unspecified: Secondary | ICD-10-CM | POA: Diagnosis not present

## 2021-03-26 LAB — CBC WITH DIFFERENTIAL/PLATELET
Abs Immature Granulocytes: 0.02 10*3/uL (ref 0.00–0.07)
Basophils Absolute: 0.1 10*3/uL (ref 0.0–0.1)
Basophils Relative: 1 %
Eosinophils Absolute: 0.1 10*3/uL (ref 0.0–0.5)
Eosinophils Relative: 1 %
HCT: 43.7 % (ref 39.0–52.0)
Hemoglobin: 14.2 g/dL (ref 13.0–17.0)
Immature Granulocytes: 0 %
Lymphocytes Relative: 34 %
Lymphs Abs: 2.8 10*3/uL (ref 0.7–4.0)
MCH: 29.7 pg (ref 26.0–34.0)
MCHC: 32.5 g/dL (ref 30.0–36.0)
MCV: 91.4 fL (ref 80.0–100.0)
Monocytes Absolute: 0.6 10*3/uL (ref 0.1–1.0)
Monocytes Relative: 8 %
Neutro Abs: 4.7 10*3/uL (ref 1.7–7.7)
Neutrophils Relative %: 56 %
Platelets: 265 10*3/uL (ref 150–400)
RBC: 4.78 MIL/uL (ref 4.22–5.81)
RDW: 13.6 % (ref 11.5–15.5)
WBC: 8.3 10*3/uL (ref 4.0–10.5)
nRBC: 0 % (ref 0.0–0.2)

## 2021-03-26 LAB — COMPREHENSIVE METABOLIC PANEL
ALT: 26 U/L (ref 0–44)
AST: 25 U/L (ref 15–41)
Albumin: 4.4 g/dL (ref 3.5–5.0)
Alkaline Phosphatase: 90 U/L (ref 38–126)
Anion gap: 12 (ref 5–15)
BUN: 12 mg/dL (ref 6–20)
CO2: 20 mmol/L — ABNORMAL LOW (ref 22–32)
Calcium: 10.4 mg/dL — ABNORMAL HIGH (ref 8.9–10.3)
Chloride: 108 mmol/L (ref 98–111)
Creatinine, Ser: 1.12 mg/dL (ref 0.61–1.24)
GFR, Estimated: >60 mL/min (ref >60)
Glucose, Bld: 107 mg/dL — ABNORMAL HIGH (ref 70–99)
Potassium: 3.5 mmol/L (ref 3.5–5.1)
Sodium: 140 mmol/L (ref 135–145)
Total Bilirubin: 0.5 mg/dL (ref 0.3–1.2)
Total Protein: 8.2 g/dL — ABNORMAL HIGH (ref 6.5–8.1)

## 2021-03-26 LAB — LACTIC ACID, PLASMA: Lactic Acid, Venous: 1 mmol/L (ref 0.5–1.9)

## 2021-03-26 MED ORDER — PIPERACILLIN-TAZOBACTAM 3.375 G IVPB 30 MIN
3.3750 g | Freq: Once | INTRAVENOUS | Status: AC
  Administered 2021-03-27: 3.375 g via INTRAVENOUS
  Filled 2021-03-26: qty 50

## 2021-03-26 MED ORDER — ACETAMINOPHEN 650 MG RE SUPP: 650.0000 mg | Freq: Four times a day (QID) | RECTAL | Status: DC | PRN

## 2021-03-26 MED ORDER — HEPARIN SODIUM (PORCINE) 5000 UNIT/ML IJ SOLN
5000.0000 [IU] | Freq: Three times a day (TID) | INTRAMUSCULAR | Status: DC
  Administered 2021-03-27 – 2021-03-30 (×10): 5000 [IU] via SUBCUTANEOUS
  Filled 2021-03-26 (×11): qty 1

## 2021-03-26 MED ORDER — ACETAMINOPHEN 325 MG PO TABS
650.0000 mg | ORAL_TABLET | Freq: Four times a day (QID) | ORAL | Status: DC | PRN
  Administered 2021-03-27 – 2021-03-28 (×2): 650 mg via ORAL
  Filled 2021-03-26 (×3): qty 2

## 2021-03-26 MED ORDER — VANCOMYCIN HCL 1500 MG/300ML IV SOLN
1500.0000 mg | Freq: Once | INTRAVENOUS | Status: AC
  Administered 2021-03-27: 1500 mg via INTRAVENOUS
  Filled 2021-03-26 (×2): qty 300

## 2021-03-26 MED ORDER — AMIODARONE HCL 200 MG PO TABS
100.0000 mg | ORAL_TABLET | Freq: Every day | ORAL | 3 refills | Status: DC
Start: 2021-03-26 — End: 2021-03-27

## 2021-03-26 NOTE — Telephone Encounter (Signed)
*  STAT* If patient is at the pharmacy, call can be transferred to refill team.   1. Which medications need to be refilled? (please list name of each medication and dose if known)  amiodarone (PACERONE) 200 MG tablet Patient takes 1/2 tablet (100 mg) daily   2. Which pharmacy/location (including street and city if local pharmacy) is medication to be sent to? [facility] [phone] - [place], [place] - [address]  3. Do they need a 30 day or 90 day supply? 90 with refills

## 2021-03-26 NOTE — Progress Notes (Addendum)
      Patient is a direct admit from our office. He presented to the office on 03-24-21 with a 1 week history of right ankle pain and swelling. He has had this off and on for several years since his original injury. Below is pertinent info from his PMH:  - Right ankle Trimalleolar ORIF 01-26-17 by Dr. Eulah Pont - Right ankle hardware removal and debridement of deep infection 07-01-18 by Dr. Eulah Pont  - Was on oral Doxy and Keflex post-op - Right ankle was aspirated under ultrasound guidance in the office on 03-25-21 and fluids sent for culture, results pending. Apparently a large amount of pus was removed from the ankle during this.  We have consulted with Dr. Lajoyce Corners on this patient and he has agreed to take over his care. Will order a right ankle MRI to get a better idea of the extent of the infection and if osteomyelitis is present. Will consult ID as patient will likely need IV antibiotics to treat this.     Addendum 03/27/21: I have been informed that the patient is telling hospital staff that he already had an ankle MRI at our office the other day. There is no record of this anywhere. He had an ultrasound guided aspiration. I think he is confused and thinking that procedure was an MRI. He has not had the ankle MRI and needs it done. I also was told he is telling staff he is having surgery on Friday. I do not know if that is true or not as I am unsure of Dr. Audrie Lia exact plans for this patient. He will be the one to decide the course of treatment and when surgery will take place. Dr. Eulah Pont only told the patient that Dr. Lajoyce Corners MIGHT do surgery on Friday. It was not presented as a definite. Hopefully this clears some things up.    Jenne Pane, PA-C Office [phone] 03/26/2021, 6:49 PM

## 2021-03-26 NOTE — ED Provider Notes (Signed)
[facility] EMERGENCY DEPARTMENT Provider Note   CSN: [id number] Arrival date & time: 03/26/21  1624     History No chief complaint on file.   [name] is a 55 y.o. male history of heart failure, HIV, hypertension, V. Tach status post pacemaker here presenting with right ankle pain and swelling.  Patient has been having right ankle pain and swelling for the last week or so.  Patient went to Dr. Greig Right office yesterday.  He had an arthrocentesis and Dr. Eulah Pont was concerned for possible septic joint.  Patient has no fever.  Dr. Eulah Pont apparently called Dr. Ophelia Charter from hospitalist and patient as a direct admit.  However there is no beds in the hospital so he was instructed to go to the ER.   The history is provided by the patient.      Past Medical History:  Diagnosis Date  . Chronic systolic heart failure (HCC)    a. Echo 12/05/11:  EF 20-25%, diff HK with mild sparing of IL wall, mild AI, mod MR, mild LAE, mild RVE, mild reduced RVF.;  b.  Echo 05/11/2012:  Mild LVH, EF 20-25%, Gr 1 diast dysfn, mod MR, mild LAE  . Depression   . G6PD deficiency   . GERD (gastroesophageal reflux disease)   . Hepatitis    Hep B and Hep C (patient does not report this but these are listed in previous notes.)  . HIV infection (HCC)   . Hypertension   . NICM (nonischemic cardiomyopathy) (HCC)    cardia CTA 8/13 negative for obstructive CAD  . Presence of permanent cardiac pacemaker   . VT (ventricular tachycardia)     Patient Active Problem List   Diagnosis Date Noted  . Joint infection (HCC) 03/26/2021  . Mixed hyperlipidemia 11/26/2019  . Cirrhosis (HCC) 05/15/2019  . Neurodermatitis 09/30/2017  . Nonischemic cardiomyopathy (HCC)   . HIV (human immunodeficiency virus infection) (HCC) 01/25/2017  . PTSD (post-traumatic stress disorder) 02/03/2014  . ICD (implantable cardioverter-defibrillator), biventricular, in situ 01/23/2014  . Paroxysmal ventricular fibrillation  (HCC) 01/19/2014  . Protein-calorie malnutrition, severe (HCC) 02/13/2013  . GERD (gastroesophageal reflux disease) 08/19/2012  . Chronic systolic heart failure (HCC) 01/25/2012  . Alcohol abuse 11/30/2011  . Genital herpes 06/09/2006  . Chronic hepatitis C without hepatic coma (HCC) 06/09/2006  . Primary hypertension 06/09/2006    Past Surgical History:  Procedure Laterality Date  . BI-VENTRICULAR PACEMAKER INSERTION N/A 06/26/2013   Procedure: BI-VENTRICULAR PACEMAKER INSERTION (CRT-P);  Surgeon: Marinus Maw, MD;  Location: [facility] CATH LAB;  Service: Cardiovascular;  Laterality: N/A;  . BIV ICD GENERATOR CHANGEOUT N/A 01/24/2021   Procedure: BIV ICD GENERATOR CHANGEOUT;  Surgeon: Marinus Maw, MD;  Location: [facility] INVASIVE CV LAB;  Service: Cardiovascular;  Laterality: N/A;  . COLONOSCOPY WITH PROPOFOL N/A 08/14/2016   Procedure: COLONOSCOPY WITH PROPOFOL;  Surgeon: Jeani Hawking, MD;  Location: [facility] ENDOSCOPY;  Service: Endoscopy;  Laterality: N/A;  . ELECTROPHYSIOLOGY STUDY N/A 02/19/2012   Procedure: ELECTROPHYSIOLOGY STUDY;  Surgeon: Marinus Maw, MD;  Location: [facility] CATH LAB;  Service: Cardiovascular;  Laterality: N/A;  . ESOPHAGOGASTRODUODENOSCOPY  12/07/2011   Procedure: ESOPHAGOGASTRODUODENOSCOPY (EGD);  Surgeon: [name], MD,FACG;  Location: [facility] ENDOSCOPY;  Service: Endoscopy;  Laterality: N/A;  . FINGER SURGERY     Thumb laceration.    Marland Kitchen HARDWARE REMOVAL Right 07/01/2018   Procedure: HARDWARE REMOVAL;  Surgeon: Sheral Apley, MD;  Location: [facility];  Service: Orthopedics;  Laterality:  Right;  Marland Kitchen INCISION AND DRAINAGE Right 07/01/2018   Procedure: INCISION AND DRAINAGE;  Surgeon: Sheral Apley, MD;  Location: [facility];  Service: Orthopedics;  Laterality: Right;  . LEFT HEART CATHETERIZATION WITH CORONARY ANGIOGRAM N/A 01/31/2014   Procedure: LEFT HEART CATHETERIZATION WITH CORONARY ANGIOGRAM;  Surgeon: Iran Ouch, MD;  Location: [facility] CATH  LAB;  Service: Cardiovascular;  Laterality: N/A;  . ORIF ANKLE FRACTURE Right 01/26/2017   Procedure: OPEN REDUCTION INTERNAL FIXATION (ORIF) ANKLE FRACTURE;  Surgeon: Sheral Apley, MD;  Location: [facility] OR;  Service: Orthopedics;  Laterality: Right;       Family History  Problem Relation Age of Onset  . Lung cancer Mother   . Colon cancer Other 50    Social History   Tobacco Use  . Smoking status: Never  . Smokeless tobacco: Never  Vaping Use  . Vaping Use: Never used  Substance Use Topics  . Alcohol use: Yes    Alcohol/week: 1.0 standard drink    Types: 1 Cans of beer per week    Comment: occ beer  . Drug use: Not Currently    Frequency: 7.0 times per week    Types: Marijuana    Comment: 1x/week, smoked day before surgery    Home Medications Prior to Admission medications   Medication Sig Start Date End Date Taking? Authorizing Provider  amiodarone (PACERONE) 200 MG tablet Take 0.5 tablets (100 mg total) by mouth daily. 03/26/21   Bensimhon, Bevelyn Buckles, MD  carvedilol (COREG) 25 MG tablet TAKE 1 TABLET(25 MG) BY MOUTH TWICE DAILY WITH A MEAL 07/30/20   Bensimhon, Bevelyn Buckles, MD  clobetasol ointment (TEMOVATE) 0.05 % Apply 1 application topically 2 (two) times daily. 12/28/16   [provider]  colchicine 0.6 MG tablet TAKE 1 TABLET(0.6 MG) BY MOUTH TWICE DAILY AS NEEDED FOR GOUT FLARES 05/01/20   Comer, [name], MD  diclofenac sodium (VOLTAREN) 1 % GEL Apply 2 g topically 4 (four) times daily. Patient not taking: Reported on 01/16/2021 05/30/18   Westley Chandler, MD  dolutegravir [facility]) 50 MG tablet 1 tab daily 05/01/20   [name], MD  doxepin (SINEQUAN) 25 MG capsule Take 1 capsule by mouth daily as needed (Itching). 02/04/18   [provider]  empagliflozin (JARDIANCE) 10 MG TABS tablet Take 1 tablet (10 mg total) by mouth daily before breakfast. 08/05/20   Bensimhon, Bevelyn Buckles, MD  emtricitabine-rilpivir-tenofovir AF (ODEFSEY) 200-25-25 MG TABS tablet  Take 1 tablet by mouth daily. 05/01/20   [name], MD  ENTRESTO 97-103 MG TAKE 1 TABLET BY MOUTH TWICE DAILY 01/27/21   Bensimhon, Bevelyn Buckles, MD  fluticasone [facility]) 50 MCG/ACT nasal spray SHAKE LIQUID AND USE 2 SPRAYS IN EACH NOSTRIL DAILY 01/17/21   Bess Kinds, MD  furosemide (LASIX) 40 MG tablet Take 40 mg by mouth daily as needed for fluid or edema.    [provider]  gabapentin (NEURONTIN) 300 MG capsule Take 300 mg by mouth 3 (three) times daily.    [provider]  hydrALAZINE (APRESOLINE) 50 MG tablet Take 1 tablet (50 mg total) by mouth 3 (three) times daily. Do not take if your systolic blood pressure is less that 120. 07/01/20 07/01/21  Bensimhon, Bevelyn Buckles, MD  hydrOXYzine (ATARAX/VISTARIL) 50 MG tablet take 1 tablet by mouth three times a day if needed 11/04/16   Elpidio Anis, PA-C  mirtazapine (REMERON) 7.5 MG tablet TAKE 1 TABLET BY MOUTH AT BEDTIME 12/26/20  Bess Kinds, MD  ondansetron (ZOFRAN) 4 MG tablet Take 4 mg by mouth as needed. 03/26/17   [provider]  spironolactone (ALDACTONE) 25 MG tablet TAKE 1 TABLET(25 MG) BY MOUTH AT BEDTIME 09/04/20   Bensimhon, Bevelyn Buckles, MD  triamcinolone cream (KENALOG) 0.1 % Apply 1 application topically 3 (three) times daily. 01/14/17   [provider]    Allergies    Bactrim  Review of Systems   Review of Systems  Musculoskeletal:        R ankle pain   All other systems reviewed and are negative.  Physical Exam Updated Vital Signs BP 122/90   Pulse 79   Temp 98.3 F (36.8 C)   Resp 16   Ht 5' 4.5" (1.638 m)   Wt 85.3 kg   SpO2 98%   BMI 31.78 kg/m   Physical Exam Vitals and nursing note reviewed.  Constitutional:      Appearance: Normal appearance.  HENT:     Head: Normocephalic.     Right Ear: Tympanic membrane normal.     Left Ear: Tympanic membrane normal.     Nose: Nose normal.     Mouth/Throat:     Mouth: Mucous membranes are moist.  Eyes:     Extraocular  Movements: Extraocular movements intact.     Pupils: Pupils are equal, round, and reactive to light.  Cardiovascular:     Rate and Rhythm: Normal rate and regular rhythm.     Pulses: Normal pulses.     Heart sounds: Normal heart sounds.  Pulmonary:     Effort: Pulmonary effort is normal.     Breath sounds: Normal breath sounds.  Abdominal:     General: Abdomen is flat.     Palpations: Abdomen is soft.  Musculoskeletal:     Cervical back: Normal range of motion and neck supple.     Comments: Right ankle swollen and tender.  Patient has 2+ pulses.  Skin:    General: Skin is warm.     Capillary Refill: Capillary refill takes less than 2 seconds.  Neurological:     General: No focal deficit present.     Mental Status: He is alert and oriented to person, place, and time.  Psychiatric:        Mood and Affect: Mood normal.        Behavior: Behavior normal.    ED Results / Procedures / Treatments   Labs (all labs ordered are listed, but only abnormal results are displayed) Labs Reviewed  COMPREHENSIVE METABOLIC PANEL - Abnormal; Notable for the following components:      Result Value   CO2 20 (*)    Glucose, Bld 107 (*)    Calcium 10.4 (*)    Total Protein 8.2 (*)    All other components within normal limits  CULTURE, BLOOD (ROUTINE X 2)  CULTURE, BLOOD (ROUTINE X 2)  RESP PANEL BY RT-PCR (FLU A&B, COVID) ARPGX2  CBC WITH DIFFERENTIAL/PLATELET  LACTIC ACID, PLASMA  LACTIC ACID, PLASMA    EKG None  Radiology DG Ankle 2 Views Right  Result Date: 03/26/2021 CLINICAL DATA:  Right ankle pain/infection. EXAM: RIGHT ANKLE - 2 VIEW COMPARISON:  X-ray right ankle 01/25/2017 FINDINGS: There is no evidence of fracture, dislocation, or joint effusion. Plantar calcaneal spur. No definite cortical erosion or destruction. Old healed distal fibular and medial malleolar fractures. There is no evidence of arthropathy or other focal bone abnormality. S subcutaneus soft tissue edema along the  anterior ankle.  IMPRESSION: 1. No acute displaced fracture or dislocation. 2. No radiographic findings to suggest definite osteomyelitis. 3. Anterior ankle subcutaneus soft tissue edema. Electronically Signed   By: Tish Frederickson M.D.   On: 03/26/2021 19:44    Procedures Procedures   Medications Ordered in ED Medications  piperacillin-tazobactam (ZOSYN) IVPB 3.375 g (has no administration in time range)  vancomycin (VANCOREADY) IVPB 1500 mg/300 mL (has no administration in time range)    ED Course  I have reviewed the triage vital signs and the nursing notes.  Pertinent labs & imaging results that were available during my care of the patient were reviewed by me and considered in my medical decision making (see chart for details).    MDM Rules/Calculators/A&P                           [name] is a 55 y.o. male here presenting with right ankle pain and swelling.  Patient had arthrocentesis done yesterday that showed possible joint infection.  Patient is planned for surgery on Friday.  I was contacted by Dr. Greig Right PA around 10:30 PM.  Patient apparently called Dr. Greig Right office again regarding getting a bed upstairs.  She was able to call flow manager and apparently there is no beds available right now.  She request that I call hospitalist.  I discussed with Dr. Loney Loh. Patient's labs showed normal white blood cell count and normal lactate.  Blood cultures were sent.  I ordered IV Vanco and Zosyn.  Hospitalist to admit and Ortho to follow   Final Clinical Impression(s) / ED Diagnoses Final diagnoses:  None    Rx / DC Orders ED Discharge Orders     None        Charlynne Pander, MD 03/26/21 2322

## 2021-03-26 NOTE — ED Provider Notes (Addendum)
Flow manager called me about this patient.  Patient was accepted by Dr. Ophelia Charter for admission.  However this patient actually does not have a bed so it is not a direct admit.  I reviewed his records and his white blood cell count was normal. His vitals are stable.  Once he gets a room in the main ED, we can consult hospitalist for admission.  May need to consult orthopedic doctor regarding arthrocentesis results (not visible in Epic) and antibiotic choices. Patient back in waiting room right now and I didn't establish care with patient.   10:31 PM Dr. Greig Right PA called. Patient is a direct admit. She will call hospitalist for admission.   11:03 PM Dr. Greig Right PA discussed with flow manager. Request that I talk to hospitalist.    Charlynne Pander, MD 03/26/21 2224    Charlynne Pander, MD 03/26/21 [phone]

## 2021-03-26 NOTE — ED Triage Notes (Signed)
Pt reports R ankle pain x 1 week. Has had previous surgery and hardware removal from same. Had ankle drained at [facility] yesterday and has a operation scheduled for Friday but was sent here by Dr. Eulah Pont to be admitted by Dr. Ophelia Charter.

## 2021-03-26 NOTE — ED Provider Notes (Signed)
Emergency Medicine Provider Triage Evaluation Note  [name] , a 55 y.o. male  was evaluated in triage.  Pt complains of right ankle pain x1 week.  She recently had surgery with hardware removal of his right ankle.  Had arthrocentesis performed at [facility] yesterday, with operation scheduled for Friday.  Patient was sent after joint cultures by Dr. Eulah Pont to be admitted by Dr. Jonah Blue with internal medicine.  Review of Systems  Positive: Right ankle pain Negative: Fevers, chills  Physical Exam  BP 122/90   Pulse 79   Temp 98.3 F (36.8 C)   Resp 16   SpO2 98%  Gen:   Awake, no distress   Resp:  Normal effort  MSK:   Moves extremities without difficulty  Other:  Diffuse swelling noted over the lateral malleolus with increased tenderness, pulses normal, and sensation intact  Medical Decision Making  Medically screening exam initiated at 6:34 PM.  Appropriate orders placed.  [name] was informed that the remainder of the evaluation will be completed by another provider, this initial triage assessment does not replace that evaluation, and the importance of remaining in the ED until their evaluation is complete.  Preop labs and imaging ordered   Jeanella Flattery 03/26/21 1836    Charlynne Pander, MD 03/26/21 2227

## 2021-03-26 NOTE — H&P (Signed)
History and Physical    [name] NLZ:[id number] DOB: 03/27/1966 DOA: 03/26/2021  PCP: Joana Reamer, DO Patient coming from: Orthopedics office  Chief Complaint: Right ankle infection  HPI: [name] is a 55 y.o. male with medical history significant of chronic systolic CHF, VT, status post ICD, prior ethanol abuse, G6PD deficiency, GERD, depression, HIV, hepatitis, hypertension presented to the ED complaining of right ankle pain.  Patient sent to the ED from orthopedic office to be admitted by hospitalist service.  Per chart, he was seen at their office on 10/24 with 1 week history of right ankle pain and swelling.  History of right ankle trimalleolar ORIF 01/26/2017 by Dr. Eulah Pont.  Right ankle hardware removal and debridement of deep infection 07/01/2018 by Dr. Eulah Pont.  Was on doxycycline and Keflex postop.  Right ankle was aspirated under ultrasound guidance in the office on 03/25/2021 and fluid sent off for culture, results pending.  Dr. Lajoyce Corners was consulted and has agreed to take over his care.  Orthopedics requesting right ankle MRI to assess the extent of the infection and if osteomyelitis is present.  Also requesting ID consultation for guidance with IV antibiotics.  In the ED, vital signs stable.  Labs showing no leukocytosis.  Lactic acid x1 normal.  Blood culture x2 pending.  Right ankle x-ray negative for osteomyelitis. Right ankle MRI ordered by orthopedics and pending.  Patient was given vancomycin and Zosyn.  Orthopedics PA told ED physician that they are planning on doing ankle surgery on Friday 10/28.  Patient states he had right ankle surgery done in 2018 and had "pins" placed.  States he had another surgery done in 2020 as his ankle "pins" became infected and had to be removed.  For the past 1 week his right ankle has been very swollen and painful.  He went to orthopedics office 2 days ago and an x-ray was done.  He went back to their office yesterday and they drained  a lot of pus from this ankle and he also had an MRI done.  He was prescribed doxycycline yesterday which he did not start taking until today.  He was sent to the ED today by orthopedics to be admitted to the hospital.  He had a temperature of 100 F this morning.  He feels nauseous sometimes and takes a medication at home, no nausea or vomiting at present.  No abdominal pain.  No cough, shortness of breath, or chest pain.  No other complaints.  Review of Systems:  All systems reviewed and apart from history of presenting illness, are negative.  Past Medical History:  Diagnosis Date   Chronic systolic heart failure (HCC)    a. Echo 12/05/11:  EF 20-25%, diff HK with mild sparing of IL wall, mild AI, mod MR, mild LAE, mild RVE, mild reduced RVF.;  b.  Echo 05/11/2012:  Mild LVH, EF 20-25%, Gr 1 diast dysfn, mod MR, mild LAE   Depression    G6PD deficiency    GERD (gastroesophageal reflux disease)    Hepatitis    Hep B and Hep C (patient does not report this but these are listed in previous notes.)   HIV infection (HCC)    Hypertension    NICM (nonischemic cardiomyopathy) (HCC)    cardia CTA 8/13 negative for obstructive CAD   Presence of permanent cardiac pacemaker    VT (ventricular tachycardia)     Past Surgical History:  Procedure Laterality Date   BI-VENTRICULAR PACEMAKER INSERTION N/A 06/26/2013  Procedure: BI-VENTRICULAR PACEMAKER INSERTION (CRT-P);  Surgeon: Marinus Maw, MD;  Location: [facility] CATH LAB;  Service: Cardiovascular;  Laterality: N/A;   BIV ICD GENERATOR CHANGEOUT N/A 01/24/2021   Procedure: BIV ICD GENERATOR CHANGEOUT;  Surgeon: Marinus Maw, MD;  Location: [facility] INVASIVE CV LAB;  Service: Cardiovascular;  Laterality: N/A;   COLONOSCOPY WITH PROPOFOL N/A 08/14/2016   Procedure: COLONOSCOPY WITH PROPOFOL;  Surgeon: Jeani Hawking, MD;  Location: [facility] ENDOSCOPY;  Service: Endoscopy;  Laterality: N/A;   ELECTROPHYSIOLOGY STUDY N/A 02/19/2012   Procedure: ELECTROPHYSIOLOGY STUDY;   Surgeon: Marinus Maw, MD;  Location: [facility] CATH LAB;  Service: Cardiovascular;  Laterality: N/A;   ESOPHAGOGASTRODUODENOSCOPY  12/07/2011   Procedure: ESOPHAGOGASTRODUODENOSCOPY (EGD);  Surgeon: [name], MD,FACG;  Location: [facility] ENDOSCOPY;  Service: Endoscopy;  Laterality: N/A;   FINGER SURGERY     Thumb laceration.     HARDWARE REMOVAL Right 07/01/2018   Procedure: HARDWARE REMOVAL;  Surgeon: Sheral Apley, MD;  Location: [facility];  Service: Orthopedics;  Laterality: Right;   INCISION AND DRAINAGE Right 07/01/2018   Procedure: INCISION AND DRAINAGE;  Surgeon: Sheral Apley, MD;  Location: [facility];  Service: Orthopedics;  Laterality: Right;   LEFT HEART CATHETERIZATION WITH CORONARY ANGIOGRAM N/A 01/31/2014   Procedure: LEFT HEART CATHETERIZATION WITH CORONARY ANGIOGRAM;  Surgeon: Iran Ouch, MD;  Location: [facility] CATH LAB;  Service: Cardiovascular;  Laterality: N/A;   ORIF ANKLE FRACTURE Right 01/26/2017   Procedure: OPEN REDUCTION INTERNAL FIXATION (ORIF) ANKLE FRACTURE;  Surgeon: Sheral Apley, MD;  Location: [facility] OR;  Service: Orthopedics;  Laterality: Right;     reports that he has never smoked. He has never used smokeless tobacco. He reports current alcohol use of about 1.0 standard drink per week. He reports that he does not currently use drugs after having used the following drugs: Marijuana. Frequency: 7.00 times per week.  Allergies  Allergen Reactions   Bactrim Rash    Family History  Problem Relation Age of Onset   Lung cancer Mother    Colon cancer Other 50    Prior to Admission medications   Medication Sig Start Date End Date Taking? Authorizing Provider  amiodarone (PACERONE) 200 MG tablet Take 0.5 tablets (100 mg total) by mouth daily. 03/26/21   Bensimhon, Bevelyn Buckles, MD  carvedilol (COREG) 25 MG tablet TAKE 1 TABLET(25 MG) BY MOUTH TWICE DAILY WITH A MEAL 07/30/20   Bensimhon, Bevelyn Buckles, MD  clobetasol ointment (TEMOVATE) 0.05  % Apply 1 application topically 2 (two) times daily. 12/28/16   [provider]  colchicine 0.6 MG tablet TAKE 1 TABLET(0.6 MG) BY MOUTH TWICE DAILY AS NEEDED FOR GOUT FLARES 05/01/20   Comer, [name], MD  diclofenac sodium (VOLTAREN) 1 % GEL Apply 2 g topically 4 (four) times daily. Patient not taking: Reported on 01/16/2021 05/30/18   Westley Chandler, MD  dolutegravir [facility]) 50 MG tablet 1 tab daily 05/01/20   [name], MD  doxepin (SINEQUAN) 25 MG capsule Take 1 capsule by mouth daily as needed (Itching). 02/04/18   [provider]  empagliflozin (JARDIANCE) 10 MG TABS tablet Take 1 tablet (10 mg total) by mouth daily before breakfast. 08/05/20   Bensimhon, Bevelyn Buckles, MD  emtricitabine-rilpivir-tenofovir AF (ODEFSEY) 200-25-25 MG TABS tablet Take 1 tablet by mouth daily. 05/01/20   [name], MD  ENTRESTO 97-103 MG TAKE 1 TABLET BY MOUTH TWICE DAILY 01/27/21   Bensimhon, Bevelyn Buckles, MD  fluticasone (FLONASE) 50 MCG/ACT nasal spray SHAKE LIQUID AND USE 2 SPRAYS IN EACH NOSTRIL DAILY 01/17/21   Bess Kinds, MD  furosemide (LASIX) 40 MG tablet Take 40 mg by mouth daily as needed for fluid or edema.    [provider]  gabapentin (NEURONTIN) 300 MG capsule Take 300 mg by mouth 3 (three) times daily.    [provider]  hydrALAZINE (APRESOLINE) 50 MG tablet Take 1 tablet (50 mg total) by mouth 3 (three) times daily. Do not take if your systolic blood pressure is less that 120. 07/01/20 07/01/21  Bensimhon, Bevelyn Buckles, MD  hydrOXYzine (ATARAX/VISTARIL) 50 MG tablet take 1 tablet by mouth three times a day if needed 11/04/16   Elpidio Anis, PA-C  mirtazapine (REMERON) 7.5 MG tablet TAKE 1 TABLET BY MOUTH AT BEDTIME 12/26/20   Bess Kinds, MD  ondansetron (ZOFRAN) 4 MG tablet Take 4 mg by mouth as needed. 03/26/17   [provider]  spironolactone (ALDACTONE) 25 MG tablet TAKE 1 TABLET(25 MG) BY MOUTH AT BEDTIME 09/04/20   Bensimhon, Bevelyn Buckles, MD   triamcinolone cream (KENALOG) 0.1 % Apply 1 application topically 3 (three) times daily. 01/14/17   [provider]    Physical Exam: Vitals:   03/26/21 1808 03/26/21 2300  BP: 122/90   Pulse: 79   Resp: 16   Temp: 98.3 F (36.8 C)   SpO2: 98%   Weight:  85.3 kg  Height:  5' 4.5" (1.638 m)    Physical Exam Constitutional:      General: He is not in acute distress. HENT:     Head: Normocephalic and atraumatic.  Eyes:     Extraocular Movements: Extraocular movements intact.     Conjunctiva/sclera: Conjunctivae normal.  Cardiovascular:     Rate and Rhythm: Normal rate and regular rhythm.     Pulses: Normal pulses.  Pulmonary:     Effort: Pulmonary effort is normal. No respiratory distress.     Breath sounds: Normal breath sounds. No wheezing or rales.  Abdominal:     General: Bowel sounds are normal. There is no distension.     Palpations: Abdomen is soft.     Tenderness: There is no abdominal tenderness.  Musculoskeletal:        General: No swelling or tenderness.     Cervical back: Normal range of motion and neck supple.     Comments: Right ankle swollen and warm to touch  Skin:    General: Skin is warm and dry.  Neurological:     General: No focal deficit present.     Mental Status: He is alert and oriented to person, place, and time.     Labs on Admission: I have personally reviewed following labs and imaging studies  CBC: Recent Labs  Lab 03/26/21 1822  WBC 8.3  NEUTROABS 4.7  HGB 14.2  HCT 43.7  MCV 91.4  PLT 265   Basic Metabolic Panel: Recent Labs  Lab 03/26/21 1822  NA 140  K 3.5  CL 108  CO2 20*  GLUCOSE 107*  BUN 12  CREATININE 1.12  CALCIUM 10.4*   GFR: Estimated Creatinine Clearance: 72.5 mL/min (by C-G formula based on SCr of 1.12 mg/dL). Liver Function Tests: Recent Labs  Lab 03/26/21 1822  AST 25  ALT 26  ALKPHOS 90  BILITOT 0.5  PROT 8.2*  ALBUMIN 4.4   No results for input(s): LIPASE, AMYLASE in the last 168  hours. No results for input(s): AMMONIA in the last  168 hours. Coagulation Profile: No results for input(s): INR, PROTIME in the last 168 hours. Cardiac Enzymes: No results for input(s): CKTOTAL, CKMB, CKMBINDEX, TROPONINI in the last 168 hours. BNP (last 3 results) No results for input(s): PROBNP in the last 8760 hours. HbA1C: No results for input(s): HGBA1C in the last 72 hours. CBG: No results for input(s): GLUCAP in the last 168 hours. Lipid Profile: No results for input(s): CHOL, HDL, LDLCALC, TRIG, CHOLHDL, LDLDIRECT in the last 72 hours. Thyroid Function Tests: No results for input(s): TSH, T4TOTAL, FREET4, T3FREE, THYROIDAB in the last 72 hours. Anemia Panel: No results for input(s): VITAMINB12, FOLATE, FERRITIN, TIBC, IRON, RETICCTPCT in the last 72 hours. Urine analysis:    Component Value Date/Time   COLORURINE AMBER (A) 01/26/2017 0657   APPEARANCEUR CLEAR 01/26/2017 0657   LABSPEC 1.018 01/26/2017 0657   PHURINE 5.0 01/26/2017 0657   GLUCOSEU NEGATIVE 01/26/2017 0657   GLUCOSEU NEG mg/dL 06/09/3233 5732   HGBUR NEGATIVE 01/26/2017 0657   HGBUR negative 09/21/2007 1425   BILIRUBINUR NEGATIVE 01/26/2017 0657   KETONESUR NEGATIVE 01/26/2017 0657   PROTEINUR NEGATIVE 01/26/2017 0657   UROBILINOGEN 0.2 02/12/2013 1406   NITRITE NEGATIVE 01/26/2017 0657   LEUKOCYTESUR NEGATIVE 01/26/2017 0657    Radiological Exams on Admission: DG Ankle 2 Views Right  Result Date: 03/26/2021 CLINICAL DATA:  Right ankle pain/infection. EXAM: RIGHT ANKLE - 2 VIEW COMPARISON:  X-ray right ankle 01/25/2017 FINDINGS: There is no evidence of fracture, dislocation, or joint effusion. Plantar calcaneal spur. No definite cortical erosion or destruction. Old healed distal fibular and medial malleolar fractures. There is no evidence of arthropathy or other focal bone abnormality. S subcutaneus soft tissue edema along the anterior ankle. IMPRESSION: 1. No acute displaced fracture or dislocation.  2. No radiographic findings to suggest definite osteomyelitis. 3. Anterior ankle subcutaneus soft tissue edema. Electronically Signed   By: Tish Frederickson M.D.   On: 03/26/2021 19:44    EKG: Independently reviewed. Interpretation limited secondary to paced rhythm.   Assessment/Plan Principal Problem:   Joint infection (HCC) Active Problems:   Primary hypertension   Chronic systolic heart failure (HCC)   GERD (gastroesophageal reflux disease)   HIV (human immunodeficiency virus infection) (HCC)   Right ankle hardware infection No fever, leukocytosis, lactic acidosis, or signs of sepsis. History of right ankle trimalleolar ORIF 01/26/2017 by Dr. Eulah Pont.  Right ankle hardware removal and debridement of deep infection 07/01/2018 by Dr. Eulah Pont.  Was on doxycycline and Keflex postop.  Right ankle was aspirated under ultrasound guidance in the office on 03/25/2021 and fluid sent off for culture, results pending.  Dr. Lajoyce Corners was consulted and has agreed to take over his care.  Orthopedics ordered right ankle MRI to assess the extent of the infection and if osteomyelitis is present.  Also requesting ID consultation for guidance with IV antibiotics.  Orthopedics PA told ED physician that they are planning on doing ankle surgery on Friday 10/28. -Continue broad-spectrum antibiotics (vancomycin and Zosyn) at this time.  Blood culture x2 pending.  Consult ID in AM.  Right ankle MRI ordered by orthopedics.  However, patient tells me he already had an MRI done yesterday and does not understand why it is being repeated today.  I am not able to find any MRI results in the chart including Care Everywhere.  Discussed with [facility] on call PA Veverly Fells, she was not able to find MRI results either.  Patient is positive that he had an MRI done yesterday,  please check with orthopedics again in the morning.  We hold MRI for now, anyways he has an ICD and compatibility needs to be verified  first.  Chronic systolic CHF: Stable.  No signs of volume overload. Hypertension: Stable. GERD Depression HIV -Pharmacy med rec pending.  DVT prophylaxis: Subcutaneous heparin Code Status: Full code Family Communication: No family available at this time. Disposition Plan: Status is: Inpatient  Remains inpatient appropriate because: Right ankle hardware infection for which patient needs IV antibiotics and surgery planned for 10/28.  Level of care: Level of care: Med-Surg  The medical decision making on this patient was of high complexity and the patient is at high risk for clinical deterioration, therefore this is a level 3 visit.  John Giovanni MD [facility]  If 7PM-7AM, please contact night-coverage [email or web address]  03/26/2021, 11:27 PM

## 2021-03-27 ENCOUNTER — Other Ambulatory Visit (HOSPITAL_COMMUNITY): Payer: Self-pay | Admitting: *Deleted

## 2021-03-27 ENCOUNTER — Inpatient Hospital Stay (HOSPITAL_COMMUNITY): Payer: Medicare Other

## 2021-03-27 ENCOUNTER — Encounter (HOSPITAL_COMMUNITY): Payer: Self-pay | Admitting: Internal Medicine

## 2021-03-27 DIAGNOSIS — I5022 Chronic systolic (congestive) heart failure: Secondary | ICD-10-CM | POA: Diagnosis not present

## 2021-03-27 DIAGNOSIS — M12571 Traumatic arthropathy, right ankle and foot: Secondary | ICD-10-CM | POA: Diagnosis not present

## 2021-03-27 DIAGNOSIS — M009 Pyogenic arthritis, unspecified: Secondary | ICD-10-CM | POA: Diagnosis not present

## 2021-03-27 DIAGNOSIS — M10071 Idiopathic gout, right ankle and foot: Secondary | ICD-10-CM

## 2021-03-27 DIAGNOSIS — Z21 Asymptomatic human immunodeficiency virus [HIV] infection status: Secondary | ICD-10-CM | POA: Diagnosis not present

## 2021-03-27 DIAGNOSIS — I10 Essential (primary) hypertension: Secondary | ICD-10-CM

## 2021-03-27 LAB — RESP PANEL BY RT-PCR (FLU A&B, COVID) ARPGX2
Influenza A by PCR: NEGATIVE
Influenza B by PCR: NEGATIVE
SARS Coronavirus 2 by RT PCR: NEGATIVE

## 2021-03-27 LAB — URIC ACID: Uric Acid, Serum: 7 mg/dL (ref 3.7–8.6)

## 2021-03-27 MED ORDER — MIRTAZAPINE 15 MG PO TABS
7.5000 mg | ORAL_TABLET | Freq: Every day | ORAL | Status: DC
  Administered 2021-03-27 – 2021-03-29 (×4): 7.5 mg via ORAL
  Filled 2021-03-27 (×4): qty 1

## 2021-03-27 MED ORDER — ONDANSETRON HCL 4 MG PO TABS: 4.0000 mg | ORAL_TABLET | Freq: Three times a day (TID) | ORAL | Status: DC | PRN

## 2021-03-27 MED ORDER — AMIODARONE HCL 200 MG PO TABS
100.0000 mg | ORAL_TABLET | Freq: Every day | ORAL | Status: DC
  Administered 2021-03-27 – 2021-03-30 (×4): 100 mg via ORAL
  Filled 2021-03-27 (×4): qty 1

## 2021-03-27 MED ORDER — COLCHICINE 0.6 MG PO TABS
0.6000 mg | ORAL_TABLET | Freq: Every day | ORAL | Status: DC
  Administered 2021-03-27 – 2021-03-28 (×2): 0.6 mg via ORAL
  Filled 2021-03-27 (×2): qty 1

## 2021-03-27 MED ORDER — PIPERACILLIN-TAZOBACTAM 3.375 G IVPB
3.3750 g | Freq: Three times a day (TID) | INTRAVENOUS | Status: DC
Start: 2021-03-27 — End: 2021-03-29
  Administered 2021-03-27 – 2021-03-29 (×8): 3.375 g via INTRAVENOUS
  Filled 2021-03-27 (×10): qty 50

## 2021-03-27 MED ORDER — MELATONIN 5 MG PO TABS
5.0000 mg | ORAL_TABLET | Freq: Every evening | ORAL | Status: DC | PRN
  Administered 2021-03-27 – 2021-03-29 (×3): 5 mg via ORAL
  Filled 2021-03-27 (×3): qty 1

## 2021-03-27 MED ORDER — ADULT MULTIVITAMIN W/MINERALS CH
ORAL_TABLET | Freq: Every day | ORAL | Status: DC
  Administered 2021-03-27 – 2021-03-29 (×4): 1 via ORAL
  Filled 2021-03-27 (×4): qty 1

## 2021-03-27 MED ORDER — FLUTICASONE PROPIONATE 50 MCG/ACT NA SUSP
2.0000 | Freq: Every day | NASAL | Status: DC
  Administered 2021-03-28 – 2021-03-30 (×3): 2 via NASAL
  Filled 2021-03-27: qty 16

## 2021-03-27 MED ORDER — SPIRONOLACTONE 25 MG PO TABS
25.0000 mg | ORAL_TABLET | Freq: Every day | ORAL | Status: DC
  Administered 2021-03-27 – 2021-03-29 (×3): 25 mg via ORAL
  Filled 2021-03-27 (×4): qty 1

## 2021-03-27 MED ORDER — DOXEPIN HCL 25 MG PO CAPS
25.0000 mg | ORAL_CAPSULE | Freq: Two times a day (BID) | ORAL | Status: DC
  Administered 2021-03-27 – 2021-03-30 (×7): 25 mg via ORAL
  Filled 2021-03-27 (×9): qty 1

## 2021-03-27 MED ORDER — HYDRALAZINE HCL 50 MG PO TABS
50.0000 mg | ORAL_TABLET | Freq: Two times a day (BID) | ORAL | Status: DC
  Administered 2021-03-28: 50 mg via ORAL
  Filled 2021-03-27 (×6): qty 1

## 2021-03-27 MED ORDER — DOLUTEGRAVIR SODIUM 50 MG PO TABS
50.0000 mg | ORAL_TABLET | Freq: Every day | ORAL | Status: DC
  Administered 2021-03-27 – 2021-03-30 (×5): 50 mg via ORAL
  Filled 2021-03-27 (×5): qty 1

## 2021-03-27 MED ORDER — SACUBITRIL-VALSARTAN 24-26 MG PO TABS
1.0000 | ORAL_TABLET | Freq: Two times a day (BID) | ORAL | Status: DC
  Administered 2021-03-27 – 2021-03-30 (×7): 1 via ORAL
  Filled 2021-03-27 (×8): qty 1

## 2021-03-27 MED ORDER — VITAMIN D 25 MCG (1000 UNIT) PO TABS
1000.0000 [IU] | ORAL_TABLET | Freq: Every day | ORAL | Status: DC
  Administered 2021-03-27 – 2021-03-30 (×4): 1000 [IU] via ORAL
  Filled 2021-03-27 (×5): qty 1

## 2021-03-27 MED ORDER — GABAPENTIN 300 MG PO CAPS
600.0000 mg | ORAL_CAPSULE | Freq: Every morning | ORAL | Status: DC
  Administered 2021-03-27 – 2021-03-30 (×4): 600 mg via ORAL
  Filled 2021-03-27 (×4): qty 2

## 2021-03-27 MED ORDER — VANCOMYCIN HCL 1250 MG/250ML IV SOLN
1250.0000 mg | INTRAVENOUS | Status: DC
  Administered 2021-03-27 – 2021-03-28 (×2): 1250 mg via INTRAVENOUS
  Filled 2021-03-27 (×4): qty 250

## 2021-03-27 MED ORDER — GABAPENTIN 300 MG PO CAPS
300.0000 mg | ORAL_CAPSULE | Freq: Every day | ORAL | Status: DC
  Administered 2021-03-27 – 2021-03-29 (×3): 300 mg via ORAL
  Filled 2021-03-27 (×3): qty 1

## 2021-03-27 MED ORDER — AMIODARONE HCL 200 MG PO TABS: 100.0000 mg | ORAL_TABLET | Freq: Every day | ORAL | 3 refills | Status: DC

## 2021-03-27 MED ORDER — EMTRICITAB-RILPIVIR-TENOFOV AF 200-25-25 MG PO TABS
1.0000 | ORAL_TABLET | Freq: Every day | ORAL | Status: DC
  Administered 2021-03-27 – 2021-03-30 (×4): 1 via ORAL
  Filled 2021-03-27 (×4): qty 1

## 2021-03-27 MED ORDER — EMPAGLIFLOZIN 10 MG PO TABS
10.0000 mg | ORAL_TABLET | Freq: Every day | ORAL | Status: DC
  Administered 2021-03-28 – 2021-03-30 (×3): 10 mg via ORAL
  Filled 2021-03-27 (×4): qty 1

## 2021-03-27 MED ORDER — HYDROXYZINE HCL 25 MG PO TABS: 50.0000 mg | ORAL_TABLET | Freq: Two times a day (BID) | ORAL | Status: DC | PRN

## 2021-03-27 MED ORDER — CARVEDILOL 25 MG PO TABS
25.0000 mg | ORAL_TABLET | Freq: Two times a day (BID) | ORAL | Status: DC
  Administered 2021-03-27 – 2021-03-30 (×5): 25 mg via ORAL
  Filled 2021-03-27 (×6): qty 1

## 2021-03-27 NOTE — Progress Notes (Signed)
PROGRESS NOTE    [name]  JYN:[id number] DOB: 1966-04-18 DOA: 03/26/2021 PCP: David Reamer, DO    Brief Narrative:  [name] was admitted to the hospital with a working diagnosis of right ankle hardware infection.  55 year old male past medical history for systolic heart failure, ventricular tachycardia status post AICD, alcohol abuse, G6PD deficiency, GERD, depression, HIV, hepatitis and hypertension who presented with right ankle pain.  Patient follows up with outpatient orthopedics.  He had open reduction internal fixation of right ankle in 2019.  Hardware was removed in 2020 due to infection.  As an outpatient on 10/25 the ankle was aspirated and fluid sent for culture.  Because of persistent symptoms and signs of worsening infections he was sent to the emergency department.  On his initial physical examination blood pressure 122/90, heart rate 79, respiratory 16, temperature 98.3, oxygen saturation 98%, lungs are clear to auscultation bilaterally, heart S1-S2, present, rhythmic, soft abdomen, right ankle had edema and erythema.  Tender to touch.  Sodium 140, potassium 3.5, chloride 108, bicarb 20, glucose 107, BUN 12, creatinine 1.12, white count 8.3, hemoglobin 14.2, hematocrit 43.7, platelets 265. SARS COVID-19 negative.  Right ankle films with no acute displaced fracture or dislocation.  No radiographic findings to suggest definite osteomyelitis.  Anterior ankle subcutaneous soft tissue edema.  Right ankle MRI with no evidence of acute fracture or osteomyelitis.  Subchondral cystic changes of the tibiotalar joint representing chronic arthritis.  Assessment & Plan:   Principal Problem:   Septic arthritis of right ankle (HCC) Active Problems:   Primary hypertension   Chronic systolic heart failure (HCC)   GERD (gastroesophageal reflux disease)   HIV (human immunodeficiency virus infection) (HCC)   1, Right ankle suspected hardware infection.  Patient continue to  have pain and local edema at the right ankle, difficulty ambulating. Wbc 8,3 and he has been afebrile  Plan to continue IV antibiotic therapy with Vancomycin/ zosybn and follow up with orthopedics recommendations Continue pain control. Follow on cell count, cultures and temperature curve.   2. HTN/ chronic systolic heart failure Continue blood pressure control with carvedilol, hydralazine and entresto.  Continue with aldactone  No clinical signs of exacerbation   On amiodarone.   3. GERD continue with antiacid therapy   4, HIV continue with antiretroviral agents.   Patient continue to be at high risk for worsening ankle infection   Status is: Inpatient  Remains inpatient appropriate because: IV antibiotic therapy   DVT prophylaxis: Scd   Code Status:   full  Family Communication:   No family at the bedside     Consultants:  Orthopedics    Antimicrobials:  Vancomycin and zosyn     Subjective: Patient with no nausea or vomiting, positive pain at the right ankle worse to touch and movement,   Objective: Vitals:   03/27/21 1031 03/27/21 1257 03/27/21 1302 03/27/21 1518  BP: (!) 128/91 (!) 135/94 136/87 130/90  Pulse: 79 78 75 79  Resp: 14 16 14 16   Temp:  98 F (36.7 C) 98.3 F (36.8 C) 98.6 F (37 C)  TempSrc:  Oral Oral Oral  SpO2: 97% 98% 99% 96%  Weight:      Height:        Intake/Output Summary (Last 24 hours) at 03/27/2021 1727 Last data filed at 03/27/2021 1537 Gross per 24 hour  Intake 400.36 ml  Output --  Net 400.36 ml   Filed Weights   03/26/21 2300  Weight: 85.3 kg  Examination:   General: Not in pain or dyspnea  Neurology: Awake and alert, non focal  E ENT: no pallor, no icterus, oral mucosa moist Cardiovascular: No JVD. S1-S2 present, rhythmic, no gallops, rubs, or murmurs. No lower extremity edema. Pulmonary: positive breath sounds bilaterally, adequate air movement, no wheezing, rhonchi or rales. Gastrointestinal. Abdomen soft  and non tender Skin. No rashes Musculoskeletal: tender effusions at the medial and lateral malleolus, no erythema or increased local temperature, positive tender to palpation         Data Reviewed: I have personally reviewed following labs and imaging studies  CBC: Recent Labs  Lab 03/26/21 1822  WBC 8.3  NEUTROABS 4.7  HGB 14.2  HCT 43.7  MCV 91.4  PLT 265   Basic Metabolic Panel: Recent Labs  Lab 03/26/21 1822  NA 140  K 3.5  CL 108  CO2 20*  GLUCOSE 107*  BUN 12  CREATININE 1.12  CALCIUM 10.4*   GFR: Estimated Creatinine Clearance: 72.5 mL/min (by C-G formula based on SCr of 1.12 mg/dL). Liver Function Tests: Recent Labs  Lab 03/26/21 1822  AST 25  ALT 26  ALKPHOS 90  BILITOT 0.5  PROT 8.2*  ALBUMIN 4.4   No results for input(s): LIPASE, AMYLASE in the last 168 hours. No results for input(s): AMMONIA in the last 168 hours. Coagulation Profile: No results for input(s): INR, PROTIME in the last 168 hours. Cardiac Enzymes: No results for input(s): CKTOTAL, CKMB, CKMBINDEX, TROPONINI in the last 168 hours. BNP (last 3 results) No results for input(s): PROBNP in the last 8760 hours. HbA1C: No results for input(s): HGBA1C in the last 72 hours. CBG: No results for input(s): GLUCAP in the last 168 hours. Lipid Profile: No results for input(s): CHOL, HDL, LDLCALC, TRIG, CHOLHDL, LDLDIRECT in the last 72 hours. Thyroid Function Tests: No results for input(s): TSH, T4TOTAL, FREET4, T3FREE, THYROIDAB in the last 72 hours. Anemia Panel: No results for input(s): VITAMINB12, FOLATE, FERRITIN, TIBC, IRON, RETICCTPCT in the last 72 hours.    Radiology Studies: I have reviewed all of the imaging during this hospital visit personally     Scheduled Meds:  amiodarone  100 mg Oral Daily   carvedilol  25 mg Oral BID WC   cholecalciferol  1,000 Units Oral Daily   colchicine  0.6 mg Oral Daily   dolutegravir  50 mg Oral Daily   doxepin  25 mg Oral BID    [START ON 03/28/2021] empagliflozin  10 mg Oral QAC breakfast   emtricitabine-rilpivir-tenofovir AF  1 tablet Oral Daily   fluticasone  2 spray Each Nare Daily   gabapentin  300 mg Oral QHS   gabapentin  600 mg Oral q AM   heparin  5,000 Units Subcutaneous Q8H   hydrALAZINE  50 mg Oral BID   mirtazapine  7.5 mg Oral QHS   multivitamin with minerals   Oral Daily   sacubitril-valsartan  1 tablet Oral BID   spironolactone  25 mg Oral QHS   Continuous Infusions:  piperacillin-tazobactam (ZOSYN)  IV 3.375 g (03/27/21 1655)   vancomycin       LOS: 1 day        Shiv Shuey Annett Gula, MD

## 2021-03-27 NOTE — ED Notes (Signed)
Pt awaiting MRI at this time. MRI states the pt requires an RN to be present during the entirety of the MRI. SWOT not available to stay with pt and ED charge did not clear this RN to be off the floor for that amount of time. [facility] called to inquire if there is anyone available to stay with the pt for MRI. Awaiting response from [facility] at this time. Informed MRI of this delay.

## 2021-03-27 NOTE — ED Notes (Signed)
Patient transported to MRI 

## 2021-03-27 NOTE — ED Notes (Signed)
Breakfast orders placed 

## 2021-03-27 NOTE — Plan of Care (Signed)

## 2021-03-27 NOTE — Consult Note (Signed)
ORTHOPAEDIC CONSULTATION  REQUESTING PHYSICIAN: Arrien, York Ram,*  Chief Complaint: Pain and swelling right ankle.  HPI: [name] is a 55 y.o. male who presents with recurrent swelling and pain right ankle.  Patient states that he underwent open reduction internal fixation of the right ankle approximately 2019.  Patient states that subsequent he did have infection with the hardware and the hardware was removed.  Patient states that since that time he has had recurrent effusions of the right ankle.  Patient also reports a history of gout does have a prescription of colchicine that he occasionally takes.  Past Medical History:  Diagnosis Date   Chronic systolic heart failure (HCC)    a. Echo 12/05/11:  EF 20-25%, diff HK with mild sparing of IL wall, mild AI, mod MR, mild LAE, mild RVE, mild reduced RVF.;  b.  Echo 05/11/2012:  Mild LVH, EF 20-25%, Gr 1 diast dysfn, mod MR, mild LAE   Depression    G6PD deficiency    GERD (gastroesophageal reflux disease)    Hepatitis    Hep B and Hep C (patient does not report this but these are listed in previous notes.)   HIV infection (HCC)    Hypertension    NICM (nonischemic cardiomyopathy) (HCC)    cardia CTA 8/13 negative for obstructive CAD   Presence of permanent cardiac pacemaker    VT (ventricular tachycardia)    Past Surgical History:  Procedure Laterality Date   BI-VENTRICULAR PACEMAKER INSERTION N/A 06/26/2013   Procedure: BI-VENTRICULAR PACEMAKER INSERTION (CRT-P);  Surgeon: Marinus Maw, MD;  Location: [facility] CATH LAB;  Service: Cardiovascular;  Laterality: N/A;   BIV ICD GENERATOR CHANGEOUT N/A 01/24/2021   Procedure: BIV ICD GENERATOR CHANGEOUT;  Surgeon: Marinus Maw, MD;  Location: [facility] INVASIVE CV LAB;  Service: Cardiovascular;  Laterality: N/A;   COLONOSCOPY WITH PROPOFOL N/A 08/14/2016   Procedure: COLONOSCOPY WITH PROPOFOL;  Surgeon: Jeani Hawking, MD;  Location: [facility] ENDOSCOPY;  Service: Endoscopy;  Laterality:  N/A;   ELECTROPHYSIOLOGY STUDY N/A 02/19/2012   Procedure: ELECTROPHYSIOLOGY STUDY;  Surgeon: Marinus Maw, MD;  Location: [facility] CATH LAB;  Service: Cardiovascular;  Laterality: N/A;   ESOPHAGOGASTRODUODENOSCOPY  12/07/2011   Procedure: ESOPHAGOGASTRODUODENOSCOPY (EGD);  Surgeon: [name], MD,FACG;  Location: [facility] ENDOSCOPY;  Service: Endoscopy;  Laterality: N/A;   FINGER SURGERY     Thumb laceration.     HARDWARE REMOVAL Right 07/01/2018   Procedure: HARDWARE REMOVAL;  Surgeon: Sheral Apley, MD;  Location: [facility];  Service: Orthopedics;  Laterality: Right;   INCISION AND DRAINAGE Right 07/01/2018   Procedure: INCISION AND DRAINAGE;  Surgeon: Sheral Apley, MD;  Location: [facility];  Service: Orthopedics;  Laterality: Right;   LEFT HEART CATHETERIZATION WITH CORONARY ANGIOGRAM N/A 01/31/2014   Procedure: LEFT HEART CATHETERIZATION WITH CORONARY ANGIOGRAM;  Surgeon: Iran Ouch, MD;  Location: [facility] CATH LAB;  Service: Cardiovascular;  Laterality: N/A;   ORIF ANKLE FRACTURE Right 01/26/2017   Procedure: OPEN REDUCTION INTERNAL FIXATION (ORIF) ANKLE FRACTURE;  Surgeon: Sheral Apley, MD;  Location: [facility] OR;  Service: Orthopedics;  Laterality: Right;   Social History   Socioeconomic History   Marital status: Single    Spouse name: Not on file   Number of children: Not on file   Years of education: Not on file   Highest education level: Not on file  Occupational History   Occupation: Unemployed  Tobacco Use   Smoking status: Never  Smokeless tobacco: Never  Vaping Use   Vaping Use: Never used  Substance and Sexual Activity   Alcohol use: Yes    Alcohol/week: 1.0 standard drink    Types: 1 Cans of beer per week    Comment: occ beer   Drug use: Not Currently    Frequency: 7.0 times per week    Types: Marijuana    Comment: 1x/week, smoked day before surgery   Sexual activity: Yes    Partners: Male    Comment: declined condoms 10/2020   Other Topics Concern   Not on file  Social History Narrative   Lives alone.  Drinks beer daily.  Liquor rarely.  He occasionally smokes marijuana..   Social Determinants of Health   Financial Resource Strain: Not on file  Food Insecurity: Not on file  Transportation Needs: Not on file  Physical Activity: Not on file  Stress: Not on file  Social Connections: Not on file   Family History  Problem Relation Age of Onset   Lung cancer Mother    Colon cancer Other 24   - negative except otherwise stated in the family history section Allergies  Allergen Reactions   Bactrim Rash   Prior to Admission medications   Medication Sig Start Date End Date Taking? Authorizing Provider  amiodarone (PACERONE) 200 MG tablet Take 0.5 tablets (100 mg total) by mouth daily. 03/26/21  Yes Bensimhon, Bevelyn Buckles, MD  carvedilol (COREG) 25 MG tablet TAKE 1 TABLET(25 MG) BY MOUTH TWICE DAILY WITH A MEAL Patient taking differently: Take 25 mg by mouth 2 (two) times daily with a meal. 07/30/20  Yes Bensimhon, Bevelyn Buckles, MD  cholecalciferol (VITAMIN D) 25 MCG (1000 UNIT) tablet Take 1,000 Units by mouth daily.   Yes [provider]  clobetasol ointment (TEMOVATE) 0.05 % Apply 1 application topically 2 (two) times daily. 12/28/16  Yes [provider]  colchicine 0.6 MG tablet TAKE 1 TABLET(0.6 MG) BY MOUTH TWICE DAILY AS NEEDED FOR GOUT FLARES Patient taking differently: Take 0.6 mg by mouth daily. 05/01/20  Yes Comer, [name], MD  dolutegravir (TIVICAY) 50 MG tablet 1 tab daily Patient taking differently: Take 50 mg by mouth daily. 05/01/20  Yes Comer, [name], MD  doxepin (SINEQUAN) 25 MG capsule Take 25 mg by mouth 2 (two) times daily. 02/04/18  Yes [provider]  doxycycline (VIBRAMYCIN) 100 MG capsule Take 100 mg by mouth See admin instructions. Bid x 10 days 03/25/21  Yes [provider]  empagliflozin (JARDIANCE) 10 MG TABS tablet Take 1 tablet (10 mg total) by mouth daily  before breakfast. 08/05/20  Yes Bensimhon, Bevelyn Buckles, MD  emtricitabine-rilpivir-tenofovir AF (ODEFSEY) 200-25-25 MG TABS tablet Take 1 tablet by mouth daily. 05/01/20  Yes Comer, [name], MD  ENTRESTO 97-103 MG TAKE 1 TABLET BY MOUTH TWICE DAILY Patient taking differently: Take 1 tablet by mouth 2 (two) times daily. 01/27/21  Yes Bensimhon, Bevelyn Buckles, MD  fluticasone (FLONASE) 50 MCG/ACT nasal spray SHAKE LIQUID AND USE 2 SPRAYS IN EACH NOSTRIL DAILY Patient taking differently: Place 2 sprays into both nostrils daily. 01/17/21  Yes Sowell, Apolinar Junes, MD  furosemide (LASIX) 40 MG tablet Take 40 mg by mouth daily as needed for fluid or edema.   Yes [provider]  gabapentin (NEURONTIN) 300 MG capsule Take 300-600 mg by mouth See admin instructions. 600 mg in the morning 300 mg at bedtime   Yes [provider]  hydrALAZINE (APRESOLINE) 50 MG tablet Take 1 tablet (  50 mg total) by mouth 3 (three) times daily. Do not take if your systolic blood pressure is less that 120. Patient taking differently: Take 50 mg by mouth See admin instructions. 1 tablet bid. May take 1 extra dose if  systolic blood pressure is over 120. 07/01/20 07/01/21 Yes Bensimhon, Bevelyn Buckles, MD  hydrOXYzine (ATARAX/VISTARIL) 50 MG tablet take 1 tablet by mouth three times a day if needed Patient taking differently: Take 50 mg by mouth 2 (two) times daily as needed for anxiety. 11/04/16  Yes Upstill, Melvenia Beam, PA-C  ibuprofen (ADVIL) 200 MG tablet Take 600-800 mg by mouth every 6 (six) hours as needed for headache or moderate pain.   Yes [provider]  mirtazapine (REMERON) 7.5 MG tablet TAKE 1 TABLET BY MOUTH AT BEDTIME Patient taking differently: Take 7.5 mg by mouth at bedtime. TAKE 1 TABLET BY MOUTH AT BEDTIME 12/26/20  Yes Sowell, Apolinar Junes, MD  Multiple Vitamins-Minerals (CENTRUM SILVER 50+MEN PO) Take 1 tablet by mouth daily.   Yes [provider]  Omega-3 Fatty Acids (FISH OIL) 1000 MG CPDR Take 1,000 mg by  mouth daily.   Yes [provider]  ondansetron (ZOFRAN) 4 MG tablet Take 4 mg by mouth every 8 (eight) hours as needed for nausea or vomiting. 03/26/17  Yes [provider]  spironolactone (ALDACTONE) 25 MG tablet TAKE 1 TABLET(25 MG) BY MOUTH AT BEDTIME Patient taking differently: Take 25 mg by mouth daily with lunch. 09/04/20  Yes Bensimhon, Bevelyn Buckles, MD  triamcinolone cream (KENALOG) 0.1 % Apply 1 application topically 3 (three) times daily. 01/14/17  Yes [provider]  diclofenac sodium (VOLTAREN) 1 % GEL Apply 2 g topically 4 (four) times daily. Patient not taking: No sig reported 05/30/18   Westley Chandler, MD  MEDROL 4 MG TBPK tablet Take 4 mg by mouth See admin instructions. 4,2,7,0,6,2 03/24/21   [provider]   DG Ankle 2 Views Right  Result Date: 03/26/2021 CLINICAL DATA:  Right ankle pain/infection. EXAM: RIGHT ANKLE - 2 VIEW COMPARISON:  X-ray right ankle 01/25/2017 FINDINGS: There is no evidence of fracture, dislocation, or joint effusion. Plantar calcaneal spur. No definite cortical erosion or destruction. Old healed distal fibular and medial malleolar fractures. There is no evidence of arthropathy or other focal bone abnormality. S subcutaneus soft tissue edema along the anterior ankle. IMPRESSION: 1. No acute displaced fracture or dislocation. 2. No radiographic findings to suggest definite osteomyelitis. 3. Anterior ankle subcutaneus soft tissue edema. Electronically Signed   By: Tish Frederickson M.D.   On: 03/26/2021 19:44   - pertinent xrays, CT, MRI studies were reviewed and independently interpreted  Positive ROS: All other systems have been reviewed and were otherwise negative with the exception of those mentioned in the HPI and as above.  Physical Exam: General: Alert, no acute distress Psychiatric: Patient is competent for consent with normal mood and affect Lymphatic: No axillary or cervical lymphadenopathy Cardiovascular: No pedal  edema Respiratory: No cyanosis, no use of accessory musculature GI: No organomegaly, abdomen is soft and non-tender    Images:  @ENCIMAGES @  Labs:  Lab Results  Component Value Date   HGBA1C 4.4 (L) 01/26/2017   HGBA1C 5.8 (H) 07/27/2012   ESRSEDRATE 8 06/10/2010   REPTSTATUS 07/06/2018 FINAL 07/01/2018   GRAMSTAIN  07/01/2018    FEW WBC PRESENT,BOTH PMN AND MONONUCLEAR NO ORGANISMS SEEN    CULT  07/01/2018    No growth aerobically or anaerobically. Performed at [facility],  [address], [place], [place] [address]     Lab Results  Component Value Date   ALBUMIN 4.4 03/26/2021   ALBUMIN 4.1 03/13/2020   ALBUMIN 3.8 01/02/2019     CBC EXTENDED Latest Ref Rng & Units 03/26/2021 01/20/2021 11/05/2020  WBC 4.0 - 10.5 K/uL 8.3 7.6 5.8  RBC 4.22 - 5.81 MIL/uL 4.78 4.53 4.61  HGB 13.0 - 17.0 g/dL 24.4 01.0 27.2  HCT 53.6 - 52.0 % 43.7 40.7 42.5  PLT 150 - 400 K/uL 265 219 223  NEUTROABS 1.7 - 7.7 K/uL 4.7 5.0 3,161  LYMPHSABS 0.7 - 4.0 K/uL 2.8 2.0 1,960    Neurologic: Patient does not have protective sensation bilateral lower extremities.   MUSCULOSKELETAL:   Skin: Examination there is swelling anteriorly to the ankle and this is tender to palpation there is no redness no cellulitis no drainage.  Patient also has pain to palpation of the great toe MTP joint.  Review of the radiographs shows hardware tracks from the old retained hardware.  There is no destructive changes from any chronic osteomyelitis.  There are degenerative changes across the tibiotalar joint consistent with posttraumatic arthritis.  Patient has palpable pulses.  White cell count is 8.3.  Hemoglobin 14.2.  Patient has palpable pulses.  Assessment: Assessment traumatic arthritis right ankle with clinical signs of gout involving the great toe MTP joint rule out septic arthritis.  Plan: I have ordered a uric acid and have ordered an MRI scan of the right ankle.  By report patient has had  an aspiration of the ankle but I do not have results from this aspiration.  Will review the MRI scan to determine if surgical intervention is necessary.  At this point patient's symptoms seem most consistent with gout and traumatic arthritis.  Thank you for the consult and the opportunity to see Mr. Allen Derry, MD [facility] [phone] 7:38 AM    There what Reames in okay I will take a look at it thank you welcome bye-bye in last name is Arvilla Market MI LLS

## 2021-03-27 NOTE — Progress Notes (Signed)
Pharmacy Antibiotic Note  [name] is a 55 y.o. male admitted on 03/26/2021 with  infected hardware .  Pharmacy has been consulted for vancomycin and Zosyn dosing.  Plan: Vancomycin 1500mg  x1 then 1250mg  IV Q24H. Goal AUC 400-550.  Expected AUC 525.  SCr 1.12.  Zosyn 3.375g IV q8h (4 hour infusion).  Height: 5' 4.5" (163.8 cm) Weight: 85.3 kg (188 lb 0.8 oz) IBW/kg (Calculated) : 60.35  Temp (24hrs), Avg:98.3 F (36.8 C), Min:98.3 F (36.8 C), Max:98.3 F (36.8 C)  Recent Labs  Lab 03/26/21 1822 03/26/21 1846  WBC 8.3  --   CREATININE 1.12  --   LATICACIDVEN  --  1.0    Estimated Creatinine Clearance: 72.5 mL/min (by C-G formula based on SCr of 1.12 mg/dL).    Allergies  Allergen Reactions   Bactrim Rash    Thank you for allowing pharmacy to be a part of this patient's care.  03/28/21, PharmD, BCPS  03/27/2021 12:00 AM

## 2021-03-27 NOTE — Progress Notes (Signed)
Changed to MRI safe mode DOO 85 Will reprogram to prior setting after scan

## 2021-03-28 DIAGNOSIS — I5022 Chronic systolic (congestive) heart failure: Secondary | ICD-10-CM | POA: Diagnosis not present

## 2021-03-28 DIAGNOSIS — M009 Pyogenic arthritis, unspecified: Secondary | ICD-10-CM | POA: Diagnosis not present

## 2021-03-28 LAB — CBC
HCT: 39.7 % (ref 39.0–52.0)
Hemoglobin: 12.8 g/dL — ABNORMAL LOW (ref 13.0–17.0)
MCH: 29.8 pg (ref 26.0–34.0)
MCHC: 32.2 g/dL (ref 30.0–36.0)
MCV: 92.5 fL (ref 80.0–100.0)
Platelets: 242 10*3/uL (ref 150–400)
RBC: 4.29 MIL/uL (ref 4.22–5.81)
RDW: 13.5 % (ref 11.5–15.5)
WBC: 8.8 10*3/uL (ref 4.0–10.5)
nRBC: 0 % (ref 0.0–0.2)

## 2021-03-28 LAB — BASIC METABOLIC PANEL
Anion gap: 9 (ref 5–15)
BUN: 16 mg/dL (ref 6–20)
CO2: 22 mmol/L (ref 22–32)
Calcium: 9.1 mg/dL (ref 8.9–10.3)
Chloride: 108 mmol/L (ref 98–111)
Creatinine, Ser: 1.32 mg/dL — ABNORMAL HIGH (ref 0.61–1.24)
GFR, Estimated: >60 mL/min (ref >60)
Glucose, Bld: 102 mg/dL — ABNORMAL HIGH (ref 70–99)
Potassium: 3.3 mmol/L — ABNORMAL LOW (ref 3.5–5.1)
Sodium: 139 mmol/L (ref 135–145)

## 2021-03-28 MED ORDER — COLCHICINE 0.6 MG PO TABS
0.6000 mg | ORAL_TABLET | Freq: Two times a day (BID) | ORAL | Status: DC
  Administered 2021-03-28 – 2021-03-30 (×4): 0.6 mg via ORAL
  Filled 2021-03-28 (×5): qty 1

## 2021-03-28 MED ORDER — POTASSIUM CHLORIDE CRYS ER 20 MEQ PO TBCR
40.0000 meq | EXTENDED_RELEASE_TABLET | Freq: Once | ORAL | Status: AC
  Administered 2021-03-28: 40 meq via ORAL
  Filled 2021-03-28: qty 2

## 2021-03-28 MED ORDER — OXYCODONE-ACETAMINOPHEN 5-325 MG PO TABS
1.0000 | ORAL_TABLET | Freq: Three times a day (TID) | ORAL | Status: DC | PRN
  Administered 2021-03-28 – 2021-03-30 (×2): 1 via ORAL
  Filled 2021-03-28 (×2): qty 1

## 2021-03-28 MED ORDER — PREDNISONE 20 MG PO TABS
20.0000 mg | ORAL_TABLET | Freq: Every day | ORAL | Status: DC
  Administered 2021-03-28 – 2021-03-29 (×2): 20 mg via ORAL
  Filled 2021-03-28 (×2): qty 1

## 2021-03-28 MED ORDER — PREDNISONE 10 MG PO TABS: 10.0000 mg | ORAL_TABLET | Freq: Every day | ORAL | Status: DC

## 2021-03-28 NOTE — Plan of Care (Signed)

## 2021-03-28 NOTE — Progress Notes (Signed)
PROGRESS NOTE    [name]  JOI:[id number] DOB: [birth year] DOA: 03/26/2021 PCP: Joana Reamer, DO    Brief Narrative:    55 year old male past medical history for systolic heart failure, ventricular tachycardia status post AICD, alcohol abuse, G6PD deficiency, GERD, depression, HIV, hepatitis and hypertension who presented with right ankle pain.  Patient follows up with outpatient orthopedics.  He had open reduction internal fixation of right ankle in 2019.  Hardware was removed in 2020 due to infection.  As an outpatient on 10/25 the ankle was aspirated and fluid sent for culture.  Because of persistent symptoms and signs of worsening infections he was sent to the emergency department.    Right ankle films with no acute displaced fracture or dislocation.  No radiographic findings to suggest definite osteomyelitis.  Anterior ankle subcutaneous soft tissue edema.  Right ankle MRI with no evidence of acute fracture or osteomyelitis.  Subchondral cystic changes of the tibiotalar joint representing chronic arthritis.  Assessment & Plan:   Principal Problem:   Septic arthritis of right ankle (HCC) Active Problems:   Primary hypertension   Chronic systolic heart failure (HCC)   GERD (gastroesophageal reflux disease)   HIV (human immunodeficiency virus infection) (HCC)   1, Right ankle /right foot pain ? Infection vs gout Case discussed with orth Dr Lajoyce Corners who recommend continue current abx, start gout treatment with colchicine 0.6mg  bid , prednisone 20mg  daily Dr will follow patient here Patient continue to have pain and local edema at the right ankle, difficulty ambulating. Wbc 8,3 and he has been afebrile  Follow on outpatient ankle aspiration result.   2. HTN/ chronic systolic heart failure Continue blood pressure control with carvedilol, hydralazine and entresto.  Continue with aldactone  No clinical signs of exacerbation   On amiodarone.   3. GERD continue  with antiacid therapy   4, HIV continue with antiretroviral agents.   Patient continue to be at high risk for worsening ankle infection   Status is: Inpatient  Remains inpatient appropriate because: IV antibiotic therapy   DVT prophylaxis: Scd   Code Status:   full  Family Communication:   No family at the bedside     Consultants:  Orthopedics    Antimicrobials:  Vancomycin and zosyn     Subjective:  Right foot pain and swelling get worse, reports take colchicine at home once a day  Patient with no nausea or vomiting,   Objective: Vitals:   03/27/21 1302 03/27/21 1518 03/27/21 2126 03/28/21 0832  BP: 136/87 130/90 118/84 123/85  Pulse: 75 79 67 68  Resp: 14 16 18 17   Temp: 98.3 F (36.8 C) 98.6 F (37 C) 97.9 F (36.6 C) 98.3 F (36.8 C)  TempSrc: Oral Oral Oral Oral  SpO2: 99% 96% 98% 94%  Weight:      Height:        Intake/Output Summary (Last 24 hours) at 03/28/2021 1225 Last data filed at 03/27/2021 1537 Gross per 24 hour  Intake 50.21 ml  Output --  Net 50.21 ml   Filed Weights   03/26/21 2300  Weight: 85.3 kg    Examination:   General: Not in pain or dyspnea  Neurology: Awake and alert, non focal  E ENT: no pallor, no icterus, oral mucosa moist Cardiovascular: No JVD. S1-S2 present, rhythmic, no gallops, rubs, or murmurs. No lower extremity edema. Pulmonary: positive breath sounds bilaterally, adequate air movement, no wheezing, rhonchi or rales. Gastrointestinal. Abdomen soft and non tender Skin.  No rashes Musculoskeletal: tender effusions at the medial and lateral malleolus, no erythema or increased local temperature, positive tender to palpation         Data Reviewed: I have personally reviewed following labs and imaging studies  CBC: Recent Labs  Lab 03/26/21 1822 03/28/21 0121  WBC 8.3 8.8  NEUTROABS 4.7  --   HGB 14.2 12.8*  HCT 43.7 39.7  MCV 91.4 92.5  PLT 265 242   Basic Metabolic Panel: Recent Labs  Lab  03/26/21 1822 03/28/21 0121  NA 140 139  K 3.5 3.3*  CL 108 108  CO2 20* 22  GLUCOSE 107* 102*  BUN 12 16  CREATININE 1.12 1.32*  CALCIUM 10.4* 9.1   GFR: Estimated Creatinine Clearance: 61.5 mL/min (A) (by C-G formula based on SCr of 1.32 mg/dL (H)). Liver Function Tests: Recent Labs  Lab 03/26/21 1822  AST 25  ALT 26  ALKPHOS 90  BILITOT 0.5  PROT 8.2*  ALBUMIN 4.4   No results for input(s): LIPASE, AMYLASE in the last 168 hours. No results for input(s): AMMONIA in the last 168 hours. Coagulation Profile: No results for input(s): INR, PROTIME in the last 168 hours. Cardiac Enzymes: No results for input(s): CKTOTAL, CKMB, CKMBINDEX, TROPONINI in the last 168 hours. BNP (last 3 results) No results for input(s): PROBNP in the last 8760 hours. HbA1C: No results for input(s): HGBA1C in the last 72 hours. CBG: No results for input(s): GLUCAP in the last 168 hours. Lipid Profile: No results for input(s): CHOL, HDL, LDLCALC, TRIG, CHOLHDL, LDLDIRECT in the last 72 hours. Thyroid Function Tests: No results for input(s): TSH, T4TOTAL, FREET4, T3FREE, THYROIDAB in the last 72 hours. Anemia Panel: No results for input(s): VITAMINB12, FOLATE, FERRITIN, TIBC, IRON, RETICCTPCT in the last 72 hours.    Radiology Studies: I have reviewed all of the imaging during this hospital visit personally     Scheduled Meds:  amiodarone  100 mg Oral Daily   carvedilol  25 mg Oral BID WC   cholecalciferol  1,000 Units Oral Daily   colchicine  0.6 mg Oral BID   dolutegravir  50 mg Oral Daily   doxepin  25 mg Oral BID   empagliflozin  10 mg Oral QAC breakfast   emtricitabine-rilpivir-tenofovir AF  1 tablet Oral Daily   fluticasone  2 spray Each Nare Daily   gabapentin  300 mg Oral QHS   gabapentin  600 mg Oral q AM   heparin  5,000 Units Subcutaneous Q8H   hydrALAZINE  50 mg Oral BID   mirtazapine  7.5 mg Oral QHS   multivitamin with minerals   Oral Daily   predniSONE  20 mg  Oral Q breakfast   sacubitril-valsartan  1 tablet Oral BID   spironolactone  25 mg Oral QHS   Continuous Infusions:  piperacillin-tazobactam (ZOSYN)  IV 3.375 g (03/28/21 0631)   vancomycin 1,250 mg (03/27/21 2117)     LOS: 2 days        Albertine Grates, MD PhD Jerrel Ivory

## 2021-03-28 NOTE — Evaluation (Signed)
Physical Therapy Evaluation Patient Details Name: [name] MRN: [id number] DOB: 05-17-1966 Today's Date: 03/28/2021  History of Present Illness  Pt is a 55 y/o male admitted 10/26 secondary to increased R ankle pain. PMH includes CHF, HTN, HIV, and s/p removal of R ankle hardware.  Clinical Impression  Pt admitted secondary to problem above with deficits below. Mobility limited to room as pt had just walked with mobility tech and was having increased pain. Supervision for safety with use of RW. Educated about using RW at home to increase safety. Pt reports he has an aide that comes for 3 hours, 7 days/week. Anticipate pt will progress well and will not require follow up PT. Will continue to follow acutely.        Recommendations for follow up therapy are one component of a multi-disciplinary discharge planning process, led by the attending physician.  Recommendations may be updated based on patient status, additional functional criteria and insurance authorization.  Follow Up Recommendations No PT follow up    Assistance Recommended at Discharge PRN  Functional Status Assessment Patient has had a recent decline in their functional status and demonstrates the ability to make significant improvements in function in a reasonable and predictable amount of time.  Equipment Recommendations  Rolling walker (2 wheels)    Recommendations for Other Services       Precautions / Restrictions Precautions Precautions: None Restrictions Weight Bearing Restrictions: No      Mobility  Bed Mobility Overal bed mobility: Modified Independent                  Transfers Overall transfer level: Needs assistance Equipment used: Rolling walker (2 wheels) Transfers: Sit to/from Stand Sit to Stand: Supervision           General transfer comment: Supervision for safety.    Ambulation/Gait Ambulation/Gait assistance: Supervision Gait Distance (Feet): 30 Feet Assistive device:  Rolling walker (2 wheels) Gait Pattern/deviations: Step-to pattern;Decreased step length - right;Decreased step length - left;Decreased weight shift to right Gait velocity: Decreased   General Gait Details: Slow, antalgic gait. supervision for safety. Limited weightshift to RLE  Stairs            Wheelchair Mobility    Modified Rankin (Stroke Patients Only)       Balance Overall balance assessment: Needs assistance Sitting-balance support: No upper extremity supported;Feet supported Sitting balance-Leahy Scale: Fair     Standing balance support: Bilateral upper extremity supported;During functional activity Standing balance-Leahy Scale: Poor Standing balance comment: Reliant on UE support                             Pertinent Vitals/Pain Pain Assessment: 0-10 Pain Score: 9  Pain Location: R ankle Pain Descriptors / Indicators: Grimacing;Guarding Pain Intervention(s): Limited activity within patient's tolerance;Monitored during session;Repositioned    Home Living Family/patient expects to be discharged to:: Private residence Living Arrangements: Alone Available Help at Discharge: Family;Friend(s) Type of Home: Apartment Home Access: Stairs to enter Entrance Stairs-Rails: Right Entrance Stairs-Number of Steps: 4   Home Layout: One level Home Equipment: Wheelchair - manual;BSC Additional Comments: Aide comes 7 days/week 3 hours    Prior Function Prior Level of Function : Needs assist       Physical Assist : ADLs (physical)   ADLs (physical): IADLs;Dressing;Bathing Mobility Comments: Pt reports normally he is independent. ADLs Comments: Pt reports aide assists with ADL and iADL tasks.     Hand Dominance  Extremity/Trunk Assessment   Upper Extremity Assessment Upper Extremity Assessment: Overall WFL for tasks assessed    Lower Extremity Assessment Lower Extremity Assessment: RLE deficits/detail RLE Deficits / Details: Reporting R  ankle pain and R forefoot pain    Cervical / Trunk Assessment Cervical / Trunk Assessment: Normal  Communication   Communication: No difficulties  Cognition Arousal/Alertness: Awake/alert Behavior During Therapy: WFL for tasks assessed/performed Overall Cognitive Status: No family/caregiver present to determine baseline cognitive functioning                                          General Comments      Exercises     Assessment/Plan    PT Assessment Patient needs continued PT services  PT Problem List Decreased activity tolerance;Decreased mobility;Pain       PT Treatment Interventions Gait training;DME instruction;Stair training;Functional mobility training;Therapeutic activities;Therapeutic exercise;Balance training;Patient/family education    PT Goals (Current goals can be found in the Care Plan section)  Acute Rehab PT Goals Patient Stated Goal: to decrease pain PT Goal Formulation: With patient Time For Goal Achievement: 04/11/21 Potential to Achieve Goals: Good    Frequency Min 3X/week   Barriers to discharge        Co-evaluation               AM-PAC PT "6 Clicks" Mobility  Outcome Measure Help needed turning from your back to your side while in a flat bed without using bedrails?: None Help needed moving from lying on your back to sitting on the side of a flat bed without using bedrails?: None Help needed moving to and from a bed to a chair (including a wheelchair)?: A Little Help needed standing up from a chair using your arms (e.g., wheelchair or bedside chair)?: A Little Help needed to walk in hospital room?: A Little Help needed climbing 3-5 steps with a railing? : A Little 6 Click Score: 20    End of Session   Activity Tolerance: Patient limited by pain Patient left: in bed;with call bell/phone within reach Nurse Communication: Mobility status PT Visit Diagnosis: Other abnormalities of gait and mobility (R26.89);Pain Pain -  Right/Left: Right Pain - part of body: Ankle and joints of foot    Time: 2919-1660 PT Time Calculation (min) (ACUTE ONLY): 13 min   Charges:   PT Evaluation $PT Eval Low Complexity: 1 Low          Cindee Salt, DPT  Acute Rehabilitation Services  Pager: [phone] Office: [phone]   Lehman Prom 03/28/2021, 2:28 PM

## 2021-03-28 NOTE — Progress Notes (Signed)
Mobility Specialist Progress Note   03/28/21 1255  Mobility  Activity Ambulated in hall  Level of Assistance Standby assist, set-up cues, supervision of patient - no hands on  Assistive Device Front wheel walker  Distance Ambulated (ft) 550 ft  Mobility Ambulated with assistance in hallway  Mobility Response Tolerated well  Mobility performed by Mobility specialist  $Mobility charge 1 Mobility   Received pt sitting EOB c/o pain and swelling in R toe w/ a pain rating of 5/10 but agreeable to mobility. Pt limps heavy and leans heavy on L side of walker to offset pressure on R foot. Over time gait straightened up and pt was able to allow more pressure on R side. Returned back to EOB w/ call bell by side and all needs met.     Mobility Specialist Phone Number [phone]  

## 2021-03-29 DIAGNOSIS — M12571 Traumatic arthropathy, right ankle and foot: Secondary | ICD-10-CM

## 2021-03-29 DIAGNOSIS — M10071 Idiopathic gout, right ankle and foot: Secondary | ICD-10-CM

## 2021-03-29 DIAGNOSIS — M009 Pyogenic arthritis, unspecified: Secondary | ICD-10-CM

## 2021-03-29 LAB — BASIC METABOLIC PANEL
Anion gap: 9 (ref 5–15)
BUN: 13 mg/dL (ref 6–20)
CO2: 21 mmol/L — ABNORMAL LOW (ref 22–32)
Calcium: 9.7 mg/dL (ref 8.9–10.3)
Chloride: 109 mmol/L (ref 98–111)
Creatinine, Ser: 1.23 mg/dL (ref 0.61–1.24)
GFR, Estimated: >60 mL/min (ref >60)
Glucose, Bld: 95 mg/dL (ref 70–99)
Potassium: 3.6 mmol/L (ref 3.5–5.1)
Sodium: 139 mmol/L (ref 135–145)

## 2021-03-29 LAB — MAGNESIUM: Magnesium: 2.2 mg/dL (ref 1.7–2.4)

## 2021-03-29 NOTE — Progress Notes (Addendum)
PROGRESS NOTE    [name]  PVX:[id number] DOB: 17-May-1966 DOA: 03/26/2021 PCP: Joana Reamer, DO    Brief Narrative:    55 year old male past medical history for systolic heart failure, ventricular tachycardia status post AICD, alcohol abuse, G6PD deficiency, GERD, depression, HIV, hepatitis and hypertension who presented with right ankle pain.  Patient follows up with outpatient orthopedics.  He had open reduction internal fixation of right ankle in 2019.  Hardware was removed in 2020 due to infection.  As an outpatient on 10/25 the ankle was aspirated and fluid sent for culture.  Because of persistent symptoms and signs of worsening infections he was sent to the emergency department.    Right ankle films with no acute displaced fracture or dislocation.  No radiographic findings to suggest definite osteomyelitis.  Anterior ankle subcutaneous soft tissue edema.  Right ankle MRI with no evidence of acute fracture or osteomyelitis.  Subchondral cystic changes of the tibiotalar joint representing chronic arthritis.  Assessment & Plan:   Principal Problem:   Septic arthritis of right ankle (HCC) Active Problems:   Primary hypertension   Chronic systolic heart failure (HCC)   GERD (gastroesophageal reflux disease)   HIV (human immunodeficiency virus infection) (HCC)   Right ankle /right foot pain, likely gout flare Reports has outpatient ankle aspiration on 10/26 at ortho's office  Seen by ortho Dr Lajoyce Corners who recommend D/c abx today, as likely gout flare Continue colchicine 0.6mg  bid , prednisone 20mg  daily, Dr will see patient in one week to start allopurinol Less pain , less edema at the right ankle, improvement in ambulation     HTN/ chronic systolic heart failure/NICM Continue blood pressure control with carvedilol, hydralazine and entresto.  Continue with aldactone  No clinical signs of exacerbation   H/o V tach, On amiodarone. + AICD  GERD continue with  antiacid therapy   HIV continue with antiretroviral agents.     Status is: Inpatient  DVT prophylaxis: Scd   Code Status:   full  Family Communication:   No family at the bedside     Consultants:  Orthopedics    Antimicrobials:  Vancomycin and zosyn     Subjective:  Right foot pain and swelling is better, but does not feel ready to go home yet, he thinks he can go home tomorrow   Objective: Vitals:   03/28/21 1358 03/28/21 2023 03/29/21 0946 03/29/21 1434  BP: 111/88 112/81 94/67 (!) 127/94  Pulse: 66 86 72 78  Resp: 17 18 18 18   Temp: 98.3 F (36.8 C) 97.9 F (36.6 C) 99.3 F (37.4 C) 97.9 F (36.6 C)  TempSrc: Oral Oral  Oral  SpO2: 95% 95% 97% 98%  Weight:      Height:       No intake or output data in the 24 hours ending 03/29/21 1649  Filed Weights   03/26/21 2300  Weight: 85.3 kg    Examination:   General: Not in pain or dyspnea  Neurology: Awake and alert, non focal  E ENT: no pallor, no icterus, oral mucosa moist Cardiovascular: No JVD. S1-S2 present, rhythmic, no gallops, rubs, or murmurs. No lower extremity edema. Pulmonary: positive breath sounds bilaterally, adequate air movement, no wheezing, rhonchi or rales. Gastrointestinal. Abdomen soft and non tender Skin. No rashes Musculoskeletal: tender effusions at the medial and lateral malleolus, no erythema or increased local temperature, positive tender to palpation         Data Reviewed: I have personally reviewed following  labs and imaging studies  CBC: Recent Labs  Lab 03/26/21 1822 03/28/21 0121  WBC 8.3 8.8  NEUTROABS 4.7  --   HGB 14.2 12.8*  HCT 43.7 39.7  MCV 91.4 92.5  PLT 265 242   Basic Metabolic Panel: Recent Labs  Lab 03/26/21 1822 03/28/21 0121 03/29/21 0829  NA 140 139 139  K 3.5 3.3* 3.6  CL 108 108 109  CO2 20* 22 21*  GLUCOSE 107* 102* 95  BUN 12 16 13   CREATININE 1.12 1.32* 1.23  CALCIUM 10.4* 9.1 9.7  MG  --   --  2.2   GFR: Estimated  Creatinine Clearance: 66 mL/min (by C-G formula based on SCr of 1.23 mg/dL). Liver Function Tests: Recent Labs  Lab 03/26/21 1822  AST 25  ALT 26  ALKPHOS 90  BILITOT 0.5  PROT 8.2*  ALBUMIN 4.4   No results for input(s): LIPASE, AMYLASE in the last 168 hours. No results for input(s): AMMONIA in the last 168 hours. Coagulation Profile: No results for input(s): INR, PROTIME in the last 168 hours. Cardiac Enzymes: No results for input(s): CKTOTAL, CKMB, CKMBINDEX, TROPONINI in the last 168 hours. BNP (last 3 results) No results for input(s): PROBNP in the last 8760 hours. HbA1C: No results for input(s): HGBA1C in the last 72 hours. CBG: No results for input(s): GLUCAP in the last 168 hours. Lipid Profile: No results for input(s): CHOL, HDL, LDLCALC, TRIG, CHOLHDL, LDLDIRECT in the last 72 hours. Thyroid Function Tests: No results for input(s): TSH, T4TOTAL, FREET4, T3FREE, THYROIDAB in the last 72 hours. Anemia Panel: No results for input(s): VITAMINB12, FOLATE, FERRITIN, TIBC, IRON, RETICCTPCT in the last 72 hours.    Radiology Studies: I have reviewed all of the imaging during this hospital visit personally     Scheduled Meds:  amiodarone  100 mg Oral Daily   carvedilol  25 mg Oral BID WC   cholecalciferol  1,000 Units Oral Daily   colchicine  0.6 mg Oral BID   dolutegravir  50 mg Oral Daily   doxepin  25 mg Oral BID   empagliflozin  10 mg Oral QAC breakfast   emtricitabine-rilpivir-tenofovir AF  1 tablet Oral Daily   fluticasone  2 spray Each Nare Daily   gabapentin  300 mg Oral QHS   gabapentin  600 mg Oral q AM   heparin  5,000 Units Subcutaneous Q8H   hydrALAZINE  50 mg Oral BID   mirtazapine  7.5 mg Oral QHS   multivitamin with minerals   Oral Daily   predniSONE  20 mg Oral Q breakfast   sacubitril-valsartan  1 tablet Oral BID   spironolactone  25 mg Oral QHS   Continuous Infusions:     LOS: 3 days        03/28/21, MD PhD Albertine Grates

## 2021-03-29 NOTE — Progress Notes (Signed)
Patient ID: [name], male   DOB: 1966-02-08, 55 y.o.   MRN: [id number] Patient states that his foot and ankle are feeling better.  MRI scan shows no abscess or osteomyelitis.  MRI does show osteochondral defects most likely secondary to the chronic gout.  Would discontinue the antibiotics at this time patient could discharge on the colchicine 0.6 mg twice a day and the prednisone 20 mg every morning and I will follow-up in the office in 1 week and start allopurinol.

## 2021-03-29 NOTE — Plan of Care (Signed)

## 2021-03-30 DIAGNOSIS — M109 Gout, unspecified: Secondary | ICD-10-CM | POA: Diagnosis not present

## 2021-03-30 DIAGNOSIS — B20 Human immunodeficiency virus [HIV] disease: Secondary | ICD-10-CM | POA: Diagnosis not present

## 2021-03-30 DIAGNOSIS — I5022 Chronic systolic (congestive) heart failure: Secondary | ICD-10-CM | POA: Diagnosis not present

## 2021-03-30 MED ORDER — INFLUENZA VAC SPLIT QUAD 0.5 ML IM SUSY: 0.5000 mL | PREFILLED_SYRINGE | INTRAMUSCULAR | Status: DC

## 2021-03-30 MED ORDER — PREDNISONE 20 MG PO TABS
40.0000 mg | ORAL_TABLET | Freq: Every day | ORAL | Status: DC
  Filled 2021-03-30: qty 2

## 2021-03-30 MED ORDER — INFLUENZA VAC SPLIT QUAD 0.5 ML IM SUSY
0.5000 mL | PREFILLED_SYRINGE | INTRAMUSCULAR | Status: AC
  Administered 2021-03-30: 0.5 mL via INTRAMUSCULAR
  Filled 2021-03-30: qty 0.5

## 2021-03-30 MED ORDER — OXYCODONE-ACETAMINOPHEN 5-325 MG PO TABS: 1.0000 | ORAL_TABLET | Freq: Three times a day (TID) | ORAL | 0 refills | Status: DC | PRN

## 2021-03-30 MED ORDER — PREDNISONE 20 MG PO TABS: 40.0000 mg | ORAL_TABLET | Freq: Every day | ORAL | 0 refills | Status: AC

## 2021-03-30 MED ORDER — COLCHICINE 0.6 MG PO TABS: 0.6000 mg | ORAL_TABLET | Freq: Two times a day (BID) | ORAL | 0 refills | Status: DC

## 2021-03-30 NOTE — TOC Transition Note (Signed)
Transition of Care [facility]) - CM/SW Discharge Note   Patient Details  Name: [name] MRN: [id number] Date of Birth: 12-Oct-1965  Transition of Care [facility]) CM/SW Contact:  Bess Kinds, RN Phone Number: [phone] 03/30/2021, 11:18 AM   Clinical Narrative:     Notified by nursing that patient was transitioning home today and needed RW. Referral to [employer] accepted - will deliver to room prior to discharge.   Final next level of care: Home/Self Care Barriers to Discharge: No Barriers Identified   Patient Goals and CMS Choice        Discharge Placement                       Discharge Plan and Services                DME Arranged: Walker rolling DME Agency: [employer] Date DME Agency Contacted: 03/30/21 Time DME Agency Contacted: [id number] Representative spoke with at DME Agency: Vaughan Basta HH Arranged: NA HH Agency: NA        Social Determinants of Health (SDOH) Interventions     Readmission Risk Interventions No flowsheet data found.

## 2021-03-30 NOTE — Progress Notes (Signed)
Discharge summary packet provided to pt with instructions. Pt verbalized understanding of instructions. No  complaints. Pt d/c to home as ordered. RW to go with pt. Pt remains alert/oriented in no apparent distress. Family is responsible for pt's ride.

## 2021-03-30 NOTE — Discharge Summary (Addendum)
Discharge Summary  [name] PVX:[id number] DOB: 02-10-1966  PCP: Joana Reamer, DO  Admit date: 03/26/2021 Discharge date: 03/30/2021  Time spent:   Recommendations for Outpatient Follow-up:  F/u with PCP within a week  for hospital discharge follow up, repeat cbc/bmp at follow up Follow-up with orthopedic Dr. Lajoyce Corners in 1 week    Discharge Diagnoses:  Active Hospital Problems   Diagnosis Date Noted   Septic arthritis of right ankle (HCC) 03/26/2021   HIV (human immunodeficiency virus infection) (HCC) 01/25/2017   GERD (gastroesophageal reflux disease) 08/19/2012   Chronic systolic heart failure (HCC) 01/25/2012   Primary hypertension 06/09/2006    Resolved Hospital Problems  No resolved problems to display.    Discharge Condition: stable  Diet recommendation: heart healthy  Filed Weights   03/26/21 2300  Weight: 85.3 kg    History of present illness: (Per admitting MD Dr. Loney Loh) Chief Complaint: Right ankle infection   HPI: [name] is a 55 y.o. male with medical history significant of chronic systolic CHF, VT, status post ICD, prior ethanol abuse, G6PD deficiency, GERD, depression, HIV, hepatitis, hypertension presented to the ED complaining of right ankle pain.  Patient sent to the ED from orthopedic office to be admitted by hospitalist service.  Per chart, he was seen at their office on 10/24 with 1 week history of right ankle pain and swelling.  History of right ankle trimalleolar ORIF 01/26/2017 by Dr. Eulah Pont.  Right ankle hardware removal and debridement of deep infection 07/01/2018 by Dr. Eulah Pont.  Was on doxycycline and Keflex postop.  Right ankle was aspirated under ultrasound guidance in the office on 03/25/2021 and fluid sent off for culture, results pending.  Dr. Lajoyce Corners was consulted and has agreed to take over his care.  Orthopedics requesting right ankle MRI to assess the extent of the infection and if osteomyelitis is present, he is  found to have gouty arthritis  Hospital Course:  Principal Problem:   Septic arthritis of right ankle (HCC) Active Problems:   Primary hypertension   Chronic systolic heart failure (HCC)   GERD (gastroesophageal reflux disease)   HIV (human immunodeficiency virus infection) (HCC)  Right ankle /right foot pain, likely gout flare He was initially treated with vancomycin  and Zosyn  reports outpatient ankle aspiration on 10/26 at ortho's office confirmed gout Seen by ortho Dr Lajoyce Corners who recommend D/c abx  Continue colchicine 0.6mg  bid , prednisone 40mg  daily, Dr Lajoyce Corners will see patient in one week to start allopurinol, case discussed with Dr Lajoyce Corners prior to discharge on 10/30 Less pain , less edema at the right ankle, improvement in ambulation  he is seen by PT who recommended rolling walker, no further therapy follow-up recommended , he desired to go home, is discharged home with close outpatient follow-up He requested percocet prn for pain, total of 10 tabs prescribed at discharge.       HTN/ chronic systolic heart failure/NICM Continue blood pressure control with carvedilol, hydralazine and entresto.  Continue with aldactone  No clinical signs of exacerbation  F/u with cardiology   H/o V tach, On amiodarone. + AICD   GERD continue with antiacid therapy    HIV continue with antiretroviral agents.       Procedures: None   Consultations: Orthopedic Dr. Lajoyce Corners  Discharge Exam: BP 105/70 (BP Location: Left Arm)   Pulse 64   Temp 98 F (36.7 C) (Oral)   Resp 17   Ht 5' 4.5" (1.638 m)  Wt 85.3 kg   SpO2 98%   BMI 31.78 kg/m   General: NAD, pleasant Cardiovascular: RRR Respiratory: Normal respiratory effort  Discharge Instructions You were cared for by a hospitalist during your hospital stay. If you have any questions about your discharge medications or the care you received while you were in the hospital after you are discharged, you can call the unit and asked to speak  with the hospitalist on call if the hospitalist that took care of you is not available. Once you are discharged, your primary care physician will handle any further medical issues. Please note that NO REFILLS for any discharge medications will be authorized once you are discharged, as it is imperative that you return to your primary care physician (or establish a relationship with a primary care physician if you do not have one) for your aftercare needs so that they can reassess your need for medications and monitor your lab values.  Discharge Instructions     Diet - low sodium heart healthy   Complete by: As directed    Increase activity slowly   Complete by: As directed       Allergies as of 03/30/2021       Reactions   Bactrim Rash        Medication List     STOP taking these medications    doxycycline 100 MG capsule Commonly known as: VIBRAMYCIN   ibuprofen 200 MG tablet Commonly known as: ADVIL       TAKE these medications    amiodarone 200 MG tablet Commonly known as: PACERONE Take 0.5 tablets (100 mg total) by mouth daily.   carvedilol 25 MG tablet Commonly known as: COREG TAKE 1 TABLET(25 MG) BY MOUTH TWICE DAILY WITH A MEAL What changed: See the new instructions.   CENTRUM SILVER 50+MEN PO Take 1 tablet by mouth daily.   cholecalciferol 25 MCG (1000 UNIT) tablet Commonly known as: VITAMIN D Take 1,000 Units by mouth daily.   clobetasol ointment 0.05 % Commonly known as: TEMOVATE Apply 1 application topically 2 (two) times daily.   colchicine 0.6 MG tablet Take 1 tablet (0.6 mg total) by mouth 2 (two) times daily. TAKE 1 TABLET(0.6 MG) BY MOUTH TWICE DAILY AS NEEDED FOR GOUT FLARES What changed:  how much to take how to take this when to take this   diclofenac sodium 1 % Gel Commonly known as: VOLTAREN Apply 2 g topically 4 (four) times daily.   doxepin 25 MG capsule Commonly known as: SINEQUAN Take 25 mg by mouth 2 (two) times daily.    empagliflozin 10 MG Tabs tablet Commonly known as: Jardiance Take 1 tablet (10 mg total) by mouth daily before breakfast.   Entresto 97-103 MG Generic drug: sacubitril-valsartan TAKE 1 TABLET BY MOUTH TWICE DAILY   Fish Oil 1000 MG Cpdr Take 1,000 mg by mouth daily.   fluticasone 50 MCG/ACT nasal spray Commonly known as: FLONASE SHAKE LIQUID AND USE 2 SPRAYS IN EACH NOSTRIL DAILY What changed: See the new instructions.   furosemide 40 MG tablet Commonly known as: LASIX Take 40 mg by mouth daily as needed for fluid or edema.   gabapentin 300 MG capsule Commonly known as: NEURONTIN Take 300-600 mg by mouth See admin instructions. 600 mg in the morning 300 mg at bedtime   hydrALAZINE 50 MG tablet Commonly known as: APRESOLINE Take 1 tablet (50 mg total) by mouth 3 (three) times daily. Do not take if your systolic blood pressure is less  that 120. What changed:  when to take this additional instructions   hydrOXYzine 50 MG tablet Commonly known as: ATARAX/VISTARIL take 1 tablet by mouth three times a day if needed What changed:  how much to take how to take this when to take this reasons to take this additional instructions   Medrol 4 MG Tbpk tablet Generic drug: methylPREDNISolone Take 4 mg by mouth See admin instructions. 6,5,4,3,2,1   mirtazapine 7.5 MG tablet Commonly known as: REMERON TAKE 1 TABLET BY MOUTH AT BEDTIME What changed:  how much to take how to take this when to take this   [facility] 200-25-25 MG Tabs tablet Generic drug: emtricitabine-rilpivir-tenofovir AF Take 1 tablet by mouth daily.   ondansetron 4 MG tablet Commonly known as: ZOFRAN Take 4 mg by mouth every 8 (eight) hours as needed for nausea or vomiting.   oxyCODONE-acetaminophen 5-325 MG tablet Commonly known as: PERCOCET/ROXICET Take 1 tablet by mouth every 8 (eight) hours as needed for severe pain.   predniSONE 20 MG tablet Commonly known as: DELTASONE Take 2 tablets (40 mg  total) by mouth daily with breakfast for 7 days. Start taking on: March 31, 2021   spironolactone 25 MG tablet Commonly known as: ALDACTONE TAKE 1 TABLET(25 MG) BY MOUTH AT BEDTIME What changed: See the new instructions.   Tivicay 50 MG tablet Generic drug: dolutegravir 1 tab daily What changed:  how much to take how to take this when to take this additional instructions   triamcinolone cream 0.1 % Commonly known as: KENALOG Apply 1 application topically 3 (three) times daily.               Durable Medical Equipment  (From admission, onward)           Start     Ordered   03/30/21 0827  For home use only DME Walker rolling  Once       Question Answer Comment  Walker: Other   Comments 2wheels   Patient needs a walker to treat with the following condition Gouty arthritis      03/30/21 0826           Allergies  Allergen Reactions   Bactrim Rash    Follow-up Information     Nadara Mustard, MD Follow up in 1 week(s).   Specialty: Orthopedic Surgery Contact information: [address] [place] [address] [phone]         Orpah Cobb P, DO Follow up in 2 week(s).   Specialty: Family Medicine Why: Hospital discharge follow-up, repeat CBC BMP at follow-up, PCP to monitor renal function Contact information: [address] [place] [address] [phone]                  The results of significant diagnostics from this hospitalization (including imaging, microbiology, ancillary and laboratory) are listed below for reference.    Significant Diagnostic Studies: DG Ankle 2 Views Right  Result Date: 03/26/2021 CLINICAL DATA:  Right ankle pain/infection. EXAM: RIGHT ANKLE - 2 VIEW COMPARISON:  X-ray right ankle 01/25/2017 FINDINGS: There is no evidence of fracture, dislocation, or joint effusion. Plantar calcaneal spur. No definite cortical erosion or destruction. Old healed distal fibular and medial malleolar fractures.  There is no evidence of arthropathy or other focal bone abnormality. S subcutaneus soft tissue edema along the anterior ankle. IMPRESSION: 1. No acute displaced fracture or dislocation. 2. No radiographic findings to suggest definite osteomyelitis. 3. Anterior ankle subcutaneus soft tissue edema. Electronically Signed  By: Tish Frederickson M.D.   On: 03/26/2021 19:44   MR ANKLE RIGHT WO CONTRAST  Result Date: 03/27/2021 CLINICAL DATA:  Ankle pain and swelling, suspected osteomyelitis EXAM: MRI OF THE RIGHT ANKLE WITHOUT CONTRAST TECHNIQUE: Multiplanar, multisequence MR imaging of the ankle was performed. No intravenous contrast was administered. COMPARISON:  Radiograph dated March 26, 2021 FINDINGS: ligaments TENDONS Peroneal: 50 Posteromedial: Intact tibialis posterior, flexor hallucis longus and flexor digitorum longus tendons. Anterior: Intact tibialis anterior, extensor hallucis longus and extensor digitorum longus tendons. Achilles: Intact. Plantar Fascia: Intact. LIGAMENTS Lateral: Anterior and posterior talofibular ligaments are intact. Anterior and posterior tibiofibular and calcaneofibular ligaments are also intact. Medial: Mild thickening of the deltoid ligament. There is also thickening of the spring ligament. CARTILAGE Ankle Joint: Subchondral cystic changes of the talar dome and tibia. Subtalar Joints/Sinus Tarsi: Normal Bones: mild edema about the inferior aspect of the medial malleolus with thickening of the deltoid and spring Other: None IMPRESSION: 1.  No evidence of acute fracture or osteomyelitis. 2. Subchondral cystic changes at the tibiotalar joint representing chronic arthritis, which is likely secondary to prior traumatic injury/altered mechanics. 3. Thickening of the spring and deltoid ligaments with mild edema about the medial malleolus, nonspecific finding, concerning for ligamentous strain. 4.  Tendinopathy/tenosynovitis of the peroneus longus and brevis. Electronically Signed   By:  Larose Hires D.O.   On: 03/27/2021 15:21    Microbiology: Recent Results (from the past 240 hour(s))  Blood culture (routine x 2)     Status: None (Preliminary result)   Collection Time: 03/26/21  6:30 PM   Specimen: BLOOD  Result Value Ref Range Status   Specimen Description BLOOD RIGHT ANTECUBITAL  Final   Special Requests   Final    BOTTLES DRAWN AEROBIC AND ANAEROBIC Blood Culture adequate volume   Culture   Final    NO GROWTH 3 DAYS Performed at [facility], [address] [place], [place] [address]    Report Status PENDING  Incomplete  Blood culture (routine x 2)     Status: None (Preliminary result)   Collection Time: 03/26/21  6:40 PM   Specimen: BLOOD  Result Value Ref Range Status   Specimen Description BLOOD BLOOD LEFT FOREARM  Final   Special Requests   Final    BOTTLES DRAWN AEROBIC ONLY Blood Culture results may not be optimal due to an inadequate volume of blood received in culture bottles   Culture   Final    NO GROWTH 3 DAYS Performed at [facility], [address] [place], [place] [address]    Report Status PENDING  Incomplete  Resp Panel by RT-PCR (Flu A&B, Covid) Nasopharyngeal Swab     Status: None   Collection Time: 03/26/21 11:55 PM   Specimen: Nasopharyngeal Swab; Nasopharyngeal(NP) swabs in vial transport medium  Result Value Ref Range Status   SARS Coronavirus 2 by RT PCR NEGATIVE NEGATIVE Final    Comment: (NOTE) SARS-CoV-2 target nucleic acids are NOT DETECTED.  The SARS-CoV-2 RNA is generally detectable in upper respiratory specimens during the acute phase of infection. The lowest concentration of SARS-CoV-2 viral copies this assay can detect is 138 copies/mL. A negative result does not preclude SARS-Cov-2 infection and should not be used as the sole basis for treatment or other patient management decisions. A negative result may occur with  improper specimen collection/handling, submission of specimen other than nasopharyngeal  swab, presence of viral mutation(s) within the areas targeted by this assay, and  inadequate number of viral copies(<138 copies/mL). A negative result must be combined with clinical observations, patient history, and epidemiological information. The expected result is Negative.  Fact Sheet for Patients:  BloggerCourse.com  Fact Sheet for Healthcare Providers:  SeriousBroker.it  This test is no t yet approved or cleared by the [place] FDA and  has been authorized for detection and/or diagnosis of SARS-CoV-2 by FDA under an Emergency Use Authorization (EUA). This EUA will remain  in effect (meaning this test can be used) for the duration of the COVID-19 declaration under Section 564(b)(1) of the Act, 21 U.S.C.section 360bbb-3(b)(1), unless the authorization is terminated  or revoked sooner.       Influenza A by PCR NEGATIVE NEGATIVE Final   Influenza B by PCR NEGATIVE NEGATIVE Final    Comment: (NOTE) The Xpert Xpress SARS-CoV-2/FLU/RSV plus assay is intended as an aid in the diagnosis of influenza from Nasopharyngeal swab specimens and should not be used as a sole basis for treatment. Nasal washings and aspirates are unacceptable for Xpert Xpress SARS-CoV-2/FLU/RSV testing.  Fact Sheet for Patients: BloggerCourse.com  Fact Sheet for Healthcare Providers: SeriousBroker.it  This test is not yet approved or cleared by the [place] FDA and has been authorized for detection and/or diagnosis of SARS-CoV-2 by FDA under an Emergency Use Authorization (EUA). This EUA will remain in effect (meaning this test can be used) for the duration of the COVID-19 declaration under Section 564(b)(1) of the Act, 21 U.S.C. section 360bbb-3(b)(1), unless the authorization is terminated or revoked.  Performed at [facility], [address] [place], [place] [address]       Labs: Basic Metabolic Panel: Recent Labs  Lab 03/26/21 1822 03/28/21 0121 03/29/21 0829  NA 140 139 139  K 3.5 3.3* 3.6  CL 108 108 109  CO2 20* 22 21*  GLUCOSE 107* 102* 95  BUN 12 16 13   CREATININE 1.12 1.32* 1.23  CALCIUM 10.4* 9.1 9.7  MG  --   --  2.2   Liver Function Tests: Recent Labs  Lab 03/26/21 1822  AST 25  ALT 26  ALKPHOS 90  BILITOT 0.5  PROT 8.2*  ALBUMIN 4.4   No results for input(s): LIPASE, AMYLASE in the last 168 hours. No results for input(s): AMMONIA in the last 168 hours. CBC: Recent Labs  Lab 03/26/21 1822 03/28/21 0121  WBC 8.3 8.8  NEUTROABS 4.7  --   HGB 14.2 12.8*  HCT 43.7 39.7  MCV 91.4 92.5  PLT 265 242   Cardiac Enzymes: No results for input(s): CKTOTAL, CKMB, CKMBINDEX, TROPONINI in the last 168 hours. BNP: BNP (last 3 results) No results for input(s): BNP in the last 8760 hours.  ProBNP (last 3 results) No results for input(s): PROBNP in the last 8760 hours.  CBG: No results for input(s): GLUCAP in the last 168 hours.     Signed:  03/30/21 MD, PhD, FACP  [facility] 03/30/2021, 8:32 AM

## 2021-03-30 NOTE — Plan of Care (Signed)

## 2021-03-31 LAB — CULTURE, BLOOD (ROUTINE X 2)
Culture: NO GROWTH
Culture: NO GROWTH
Special Requests: ADEQUATE

## 2021-04-10 ENCOUNTER — Ambulatory Visit (INDEPENDENT_AMBULATORY_CARE_PROVIDER_SITE_OTHER): Payer: Medicare Other | Admitting: Orthopedic Surgery

## 2021-04-10 DIAGNOSIS — M10071 Idiopathic gout, right ankle and foot: Secondary | ICD-10-CM

## 2021-04-10 MED ORDER — ALLOPURINOL 100 MG PO TABS: 100.0000 mg | ORAL_TABLET | Freq: Two times a day (BID) | ORAL | 3 refills | Status: DC

## 2021-04-15 ENCOUNTER — Other Ambulatory Visit: Payer: Self-pay | Admitting: Student

## 2021-04-15 DIAGNOSIS — G4700 Insomnia, unspecified: Secondary | ICD-10-CM

## 2021-04-18 ENCOUNTER — Telehealth: Payer: Self-pay | Admitting: Orthopedic Surgery

## 2021-04-18 NOTE — Telephone Encounter (Signed)
Pt called and thinks the gout is flaring up. He said it started on the ankle and now is on top of the foot. Can you call him please?   CB [phone]

## 2021-04-18 NOTE — Telephone Encounter (Signed)
I called and sw pt and he states that he is taking his allopurinol only. Advised that he can take his colchicine per protocol up to two times a day and then stop once the pain has resolved. Offered an appt on Monday and pt states that he will try this and see how he does and then call on Monday with an update.

## 2021-04-29 ENCOUNTER — Other Ambulatory Visit: Payer: Self-pay

## 2021-04-29 ENCOUNTER — Telehealth: Payer: Self-pay

## 2021-04-29 ENCOUNTER — Other Ambulatory Visit: Payer: Self-pay | Admitting: Internal Medicine

## 2021-04-29 ENCOUNTER — Ambulatory Visit (INDEPENDENT_AMBULATORY_CARE_PROVIDER_SITE_OTHER): Payer: Medicare Other | Admitting: Internal Medicine

## 2021-04-29 ENCOUNTER — Ambulatory Visit (INDEPENDENT_AMBULATORY_CARE_PROVIDER_SITE_OTHER): Payer: Medicare Other

## 2021-04-29 VITALS — BP 98/60 | HR 79 | Ht 64.5 in | Wt 184.0 lb

## 2021-04-29 DIAGNOSIS — I4901 Ventricular fibrillation: Secondary | ICD-10-CM

## 2021-04-29 DIAGNOSIS — I428 Other cardiomyopathies: Secondary | ICD-10-CM | POA: Diagnosis not present

## 2021-04-29 DIAGNOSIS — Z95810 Presence of automatic (implantable) cardiac defibrillator: Secondary | ICD-10-CM

## 2021-04-29 DIAGNOSIS — I5022 Chronic systolic (congestive) heart failure: Secondary | ICD-10-CM

## 2021-04-29 DIAGNOSIS — G4700 Insomnia, unspecified: Secondary | ICD-10-CM

## 2021-04-29 LAB — CUP PACEART REMOTE DEVICE CHECK
Battery Remaining Longevity: 113 mo
Battery Voltage: 3.07 V
Brady Statistic AP VP Percent: 0.05 %
Brady Statistic AP VS Percent: 0.02 %
Brady Statistic AS VP Percent: 98.58 %
Brady Statistic AS VS Percent: 1.35 %
Brady Statistic RA Percent Paced: 0.07 %
Brady Statistic RV Percent Paced: 1.68 %
Date Time Interrogation Session: 20221129003742
HighPow Impedance: 82 Ohm
Implantable Lead Implant Date: 20150126
Implantable Lead Implant Date: 20150126
Implantable Lead Implant Date: 20150126
Implantable Lead Location: 753858
Implantable Lead Location: 753859
Implantable Lead Location: 753860
Implantable Lead Model: 4396
Implantable Lead Model: 5076
Implantable Lead Model: 6935
Implantable Pulse Generator Implant Date: 20220826
Lead Channel Impedance Value: 1083 Ohm
Lead Channel Impedance Value: 342 Ohm
Lead Channel Impedance Value: 399 Ohm
Lead Channel Impedance Value: 399 Ohm
Lead Channel Impedance Value: 494 Ohm
Lead Channel Impedance Value: 855 Ohm
Lead Channel Pacing Threshold Amplitude: 0.5 V
Lead Channel Pacing Threshold Amplitude: 0.625 V
Lead Channel Pacing Threshold Amplitude: 1.25 V
Lead Channel Pacing Threshold Pulse Width: 0.4 ms
Lead Channel Pacing Threshold Pulse Width: 0.4 ms
Lead Channel Pacing Threshold Pulse Width: 0.4 ms
Lead Channel Sensing Intrinsic Amplitude: 1.75 mV
Lead Channel Sensing Intrinsic Amplitude: 1.75 mV
Lead Channel Sensing Intrinsic Amplitude: 15 mV
Lead Channel Sensing Intrinsic Amplitude: 15 mV
Lead Channel Setting Pacing Amplitude: 1.5 V
Lead Channel Setting Pacing Amplitude: 1.75 V
Lead Channel Setting Pacing Amplitude: 2 V
Lead Channel Setting Pacing Pulse Width: 0.4 ms
Lead Channel Setting Pacing Pulse Width: 0.4 ms
Lead Channel Setting Sensing Sensitivity: 0.3 mV

## 2021-04-29 MED ORDER — AMIODARONE HCL 200 MG PO TABS: 100.0000 mg | ORAL_TABLET | Freq: Every day | ORAL | 3 refills | Status: DC

## 2021-04-29 NOTE — Patient Instructions (Addendum)
Medication Instructions:  Your physician recommends that you continue on your current medications as directed. Please refer to the Current Medication list given to you today.  Labwork: None ordered.  Testing/Procedures: None ordered.  Follow-Up: Your physician wants you to follow-up in: one year with Casimiro Needle "Mardelle Matte" Lanna Poche, PA-C  Remote monitoring is used to monitor your ICD from home. This monitoring reduces the number of office visits required to check your device to one time per year. It allows [place] to keep an eye on the functioning of your device to ensure it is working properly. You are scheduled for a device check from home on 07/29/2021. You may send your transmission at any time that day. If you have a wireless device, the transmission will be sent automatically. After your physician reviews your transmission, you will receive a postcard with your next transmission date.  Any Other Special Instructions Will Be Listed Below (If Applicable).  If you need a refill on your cardiac medications before your next appointment, please call your pharmacy.

## 2021-04-29 NOTE — Progress Notes (Signed)
HPI [name] returns today for followup. He is a pleasant middle aged man with chronic systolic heart failure and VT, prior ETOH abuse, s/p ICD insertion. His VT has been well controlled on amiodarone 100 mg daily and with cessation of ETOH abuse. He has not had syncope. He admits to dietary indiscretion and has not been working out. He has gained over 30 lbs in the past 5 years but only 3 pounds in the past year. He actually lost 3 lbs since his last visit. He denies chest pain or sob. Allergies  Allergen Reactions   Bactrim Rash     Current Outpatient Medications  Medication Sig Dispense Refill   allopurinol (ZYLOPRIM) 100 MG tablet Take 1 tablet (100 mg total) by mouth 2 (two) times daily. 60 tablet 3   amiodarone (PACERONE) 200 MG tablet Take 0.5 tablets (100 mg total) by mouth daily. 45 tablet 3   carvedilol (COREG) 25 MG tablet TAKE 1 TABLET(25 MG) BY MOUTH TWICE DAILY WITH A MEAL (Patient taking differently: Take 25 mg by mouth 2 (two) times daily with a meal.) 180 tablet 3   cholecalciferol (VITAMIN D) 25 MCG (1000 UNIT) tablet Take 1,000 Units by mouth daily.     clobetasol ointment (TEMOVATE) [id number] % Apply 1 application topically 2 (two) times daily.  0   colchicine 0.6 MG tablet Take 1 tablet (0.6 mg total) by mouth 2 (two) times daily. TAKE 1 TABLET(0.6 MG) BY MOUTH TWICE DAILY AS NEEDED FOR GOUT FLARES 14 tablet 0   diclofenac sodium (VOLTAREN) 1 % GEL Apply 2 g topically 4 (four) times daily. 100 g 11   dolutegravir (TIVICAY) 50 MG tablet 1 tab daily (Patient taking differently: Take 50 mg by mouth daily.) 30 tablet 11   doxepin (SINEQUAN) 25 MG capsule Take 25 mg by mouth 2 (two) times daily.  5   empagliflozin (JARDIANCE) 10 MG TABS tablet Take 1 tablet (10 mg total) by mouth daily before breakfast. 30 tablet 11   emtricitabine-rilpivir-tenofovir AF (ODEFSEY) 200-25-25 MG TABS tablet Take 1 tablet by mouth daily. 30 tablet 11   ENTRESTO 97-103 MG TAKE 1 TABLET BY MOUTH  TWICE DAILY (Patient taking differently: Take 1 tablet by mouth 2 (two) times daily.) 60 tablet 11   fluticasone (FLONASE) 50 MCG/ACT nasal spray SHAKE LIQUID AND USE 2 SPRAYS IN EACH NOSTRIL DAILY (Patient taking differently: Place 2 sprays into both nostrils daily.) 16 g 6   furosemide (LASIX) 40 MG tablet Take 40 mg by mouth daily as needed for fluid or edema.     gabapentin (NEURONTIN) 300 MG capsule Take 300-600 mg by mouth See admin instructions. 600 mg in the morning 300 mg at bedtime     hydrALAZINE (APRESOLINE) 50 MG tablet Take 1 tablet (50 mg total) by mouth 3 (three) times daily. Do not take if your systolic blood pressure is less that 120. (Patient taking differently: Take 50 mg by mouth See admin instructions. 1 tablet bid. May take 1 extra dose if  systolic blood pressure is over 120.) 120 tablet 11   hydrOXYzine (ATARAX/VISTARIL) 50 MG tablet take 1 tablet by mouth three times a day if needed (Patient taking differently: Take 50 mg by mouth 2 (two) times daily as needed for anxiety.) 15 tablet 1   MEDROL 4 MG TBPK tablet Take 4 mg by mouth See admin instructions. 6,5,4,3,2,1     mirtazapine (REMERON) 7.5 MG tablet TAKE 1 TABLET BY MOUTH AT BEDTIME (Patient  taking differently: Take 7.5 mg by mouth at bedtime. TAKE 1 TABLET BY MOUTH AT BEDTIME) 30 tablet 3   Multiple Vitamins-Minerals (CENTRUM SILVER 50+MEN PO) Take 1 tablet by mouth daily.     Omega-3 Fatty Acids (FISH OIL) 1000 MG CPDR Take 1,000 mg by mouth daily.     ondansetron (ZOFRAN) 4 MG tablet Take 4 mg by mouth every 8 (eight) hours as needed for nausea or vomiting.  0   oxyCODONE-acetaminophen (PERCOCET/ROXICET) 5-325 MG tablet Take 1 tablet by mouth every 8 (eight) hours as needed for severe pain. 10 tablet 0   spironolactone (ALDACTONE) 25 MG tablet TAKE 1 TABLET(25 MG) BY MOUTH AT BEDTIME (Patient taking differently: Take 25 mg by mouth daily with lunch.) 30 tablet 11   triamcinolone cream (KENALOG) 0.1 % Apply 1  application topically 3 (three) times daily.  0   No current facility-administered medications for this visit.     Past Medical History:  Diagnosis Date   Chronic systolic heart failure ([place])    a. Echo 12/05/11:  EF 20-25%, diff HK with mild sparing of IL wall, mild AI, mod MR, mild LAE, mild RVE, mild reduced RVF.;  b.  Echo 05/11/2012:  Mild LVH, EF 20-25%, Gr 1 diast dysfn, mod MR, mild LAE   Depression    G6PD deficiency    GERD (gastroesophageal reflux disease)    Hepatitis    Hep B and Hep C (patient does not report this but these are listed in previous notes.)   HIV infection ([place])    Hypertension    NICM (nonischemic cardiomyopathy) ([place])    cardia CTA 8/13 negative for obstructive CAD   Presence of permanent cardiac pacemaker    VT (ventricular tachycardia)     ROS:   All systems reviewed and negative except as noted in the HPI.   Past Surgical History:  Procedure Laterality Date   BI-VENTRICULAR PACEMAKER INSERTION N/A 06/26/2013   Procedure: BI-VENTRICULAR PACEMAKER INSERTION (CRT-P);  Surgeon: Evans Lance, MD;  Location: [facility] CATH LAB;  Service: Cardiovascular;  Laterality: N/A;   BIV ICD GENERATOR CHANGEOUT N/A 01/24/2021   Procedure: BIV ICD GENERATOR CHANGEOUT;  Surgeon: Evans Lance, MD;  Location: [facility] CV LAB;  Service: Cardiovascular;  Laterality: N/A;   COLONOSCOPY WITH PROPOFOL N/A 08/14/2016   Procedure: COLONOSCOPY WITH PROPOFOL;  Surgeon: Carol Ada, MD;  Location: [facility] ENDOSCOPY;  Service: Endoscopy;  Laterality: N/A;   ELECTROPHYSIOLOGY STUDY N/A 02/19/2012   Procedure: ELECTROPHYSIOLOGY STUDY;  Surgeon: Evans Lance, MD;  Location: [facility] CATH LAB;  Service: Cardiovascular;  Laterality: N/A;   ESOPHAGOGASTRODUODENOSCOPY  12/07/2011   Procedure: ESOPHAGOGASTRODUODENOSCOPY (EGD);  Surgeon: Ladene Artist, MD,FACG;  Location: [facility] ENDOSCOPY;  Service: Endoscopy;  Laterality: N/A;   FINGER SURGERY     Thumb laceration.     HARDWARE REMOVAL Right  07/01/2018   Procedure: HARDWARE REMOVAL;  Surgeon: Renette Butters, MD;  Location: [place];  Service: Orthopedics;  Laterality: Right;   INCISION AND DRAINAGE Right 07/01/2018   Procedure: INCISION AND DRAINAGE;  Surgeon: Renette Butters, MD;  Location: [place];  Service: Orthopedics;  Laterality: Right;   LEFT HEART CATHETERIZATION WITH CORONARY ANGIOGRAM N/A 01/31/2014   Procedure: LEFT HEART CATHETERIZATION WITH CORONARY ANGIOGRAM;  Surgeon: Wellington Hampshire, MD;  Location: [place] CATH LAB;  Service: Cardiovascular;  Laterality: N/A;   ORIF ANKLE FRACTURE Right 01/26/2017   Procedure: OPEN REDUCTION INTERNAL FIXATION (ORIF) ANKLE FRACTURE;  Surgeon: Percell Miller,  Jewel Baize, MD;  Location: [facility] OR;  Service: Orthopedics;  Laterality: Right;     Family History  Problem Relation Age of Onset   Lung cancer Mother    Colon cancer Other 69     Social History   Socioeconomic History   Marital status: Single    Spouse name: Not on file   Number of children: Not on file   Years of education: Not on file   Highest education level: Not on file  Occupational History   Occupation: Unemployed  Tobacco Use   Smoking status: Never   Smokeless tobacco: Never  Vaping Use   Vaping Use: Never used  Substance and Sexual Activity   Alcohol use: Yes    Alcohol/week: 1.0 standard drink    Types: 1 Cans of beer per week    Comment: occ beer   Drug use: Not Currently    Frequency: 7.0 times per week    Types: Marijuana    Comment: 1x/week, smoked day before surgery   Sexual activity: Yes    Partners: Male    Comment: declined condoms 10/2020  Other Topics Concern   Not on file  Social History Narrative   Lives alone.  Drinks beer daily.  Liquor rarely.  He occasionally smokes marijuana..   Social Determinants of Health   Financial Resource Strain: Not on file  Food Insecurity: Not on file  Transportation Needs: Not on file  Physical Activity: Not on file   Stress: Not on file  Social Connections: Not on file  Intimate Partner Violence: Not on file     BP 98/60   Pulse 79   Ht 5' 4.5" (1.638 m)   Wt 184 lb (83.5 kg)   SpO2 96%   BMI 31.10 kg/m   Physical Exam:  Well appearing NAD HEENT: Unremarkable Neck:  No JVD, no thyromegally Lymphatics:  No adenopathy Back:  No CVA tenderness Lungs:  Clear with no wheezes HEART:  Regular rate rhythm, no murmurs, no rubs, no clicks Abd:  soft, positive bowel sounds, no organomegally, no rebound, no guarding Ext:  2 plus pulses, no edema, no cyanosis, no clubbing Skin:  No rashes no nodules Neuro:  CN II through XII intact, motor grossly intact  EKG - nsr with biv pacing  DEVICE  Normal device function.  See PaceArt for details.   Assess/Plan:  1. Chronic systolic heart failure - his symptoms remain class 2. He will continue his current meds.  2. VT - he has maintained NSR on low dose amiodarone  3. ETOH abuse - he has not consumed excess ETOH. He drinks rarely. I encouraged him to never drink more than 2 beers a day or 7 in a week. 4. ICD - his medtronic Biv ICD is working normally. He has undergone ICD gen change and healed up nicely  Dorathy Daft

## 2021-04-29 NOTE — Telephone Encounter (Signed)
Patient called requesting refills of mirtazapine. He has a PCP, but states that Dr. Luciana Axe has always filled it for him. Will route to provider.   Sandie Ano, RN

## 2021-04-29 NOTE — Telephone Encounter (Signed)
Message sent to provider, waiting on approval.

## 2021-04-30 ENCOUNTER — Other Ambulatory Visit: Payer: Self-pay | Admitting: Family Medicine

## 2021-04-30 DIAGNOSIS — G4700 Insomnia, unspecified: Secondary | ICD-10-CM

## 2021-04-30 NOTE — Telephone Encounter (Signed)
[facility] Called Pharmacy and they will send refill request to PCP office.

## 2021-05-05 ENCOUNTER — Encounter: Payer: Self-pay | Admitting: Internal Medicine

## 2021-05-05 ENCOUNTER — Ambulatory Visit (INDEPENDENT_AMBULATORY_CARE_PROVIDER_SITE_OTHER): Payer: Medicare Other | Admitting: Internal Medicine

## 2021-05-05 ENCOUNTER — Other Ambulatory Visit: Payer: Self-pay

## 2021-05-05 VITALS — BP 122/90 | HR 85 | Temp 98.1°F | Wt 184.0 lb

## 2021-05-05 DIAGNOSIS — B20 Human immunodeficiency virus [HIV] disease: Secondary | ICD-10-CM | POA: Diagnosis not present

## 2021-05-05 DIAGNOSIS — F1010 Alcohol abuse, uncomplicated: Secondary | ICD-10-CM

## 2021-05-05 DIAGNOSIS — K7460 Unspecified cirrhosis of liver: Secondary | ICD-10-CM

## 2021-05-05 DIAGNOSIS — Z113 Encounter for screening for infections with a predominantly sexual mode of transmission: Secondary | ICD-10-CM

## 2021-05-06 ENCOUNTER — Encounter: Payer: Self-pay | Admitting: Internal Medicine

## 2021-05-06 NOTE — Assessment & Plan Note (Signed)
Screened negative 

## 2021-05-06 NOTE — Assessment & Plan Note (Signed)
He continues to do well and no indication for any change.  He will return in 6 months.

## 2021-05-06 NOTE — Assessment & Plan Note (Signed)
Discussed the importance of cessation

## 2021-05-06 NOTE — Assessment & Plan Note (Signed)
Recent ultrasound and will repeat after his next visit

## 2021-05-06 NOTE — Progress Notes (Signed)
   Subjective:    Patient ID: [name], male    DOB: 17-Jul-1965, 55 y.o.   MRN: [id number]  HPI Here for follow up of HIV He continues on [facility] and Tivicay for his salvage regimen and tolerating well.  He has had no new issues.  Asking about Mirtazapine refills. He denies any missed doses.  He has had no issues with getting, taking or tolerating his medication.  He was recently hospitalized for ankle swelling and found to have an acute gouty attack.  His last liver ultrasound was in July and no issues. He is now sexually active and using condoms.     Review of Systems  Constitutional:  Negative for fatigue.  Gastrointestinal:  Negative for diarrhea and nausea.  Skin:  Negative for rash.      Objective:   Physical Exam Eyes:     General: No scleral icterus. Pulmonary:     Effort: Pulmonary effort is normal.  Skin:    Findings: No rash.  Neurological:     General: No focal deficit present.     Mental Status: He is alert.  Psychiatric:        Mood and Affect: Mood normal.   SH: drinks beer        Assessment & Plan:

## 2021-05-07 LAB — T-HELPER CELL (CD4) - (RCID CLINIC ONLY)
CD4 % Helper T Cell: 36 % (ref 33–65)
CD4 T Cell Abs: 1008 /uL (ref 400–1790)

## 2021-05-08 ENCOUNTER — Ambulatory Visit (INDEPENDENT_AMBULATORY_CARE_PROVIDER_SITE_OTHER): Payer: Medicare Other | Admitting: Orthopedic Surgery

## 2021-05-08 DIAGNOSIS — M722 Plantar fascial fibromatosis: Secondary | ICD-10-CM

## 2021-05-08 DIAGNOSIS — M25571 Pain in right ankle and joints of right foot: Secondary | ICD-10-CM

## 2021-05-08 LAB — HIV-1 RNA QUANT-NO REFLEX-BLD
HIV 1 RNA Quant: NOT DETECTED Copies/mL
HIV-1 RNA Quant, Log: NOT DETECTED Log cps/mL

## 2021-05-08 NOTE — Progress Notes (Signed)
Remote ICD transmission.   

## 2021-05-15 ENCOUNTER — Other Ambulatory Visit: Payer: Self-pay | Admitting: Internal Medicine

## 2021-05-15 DIAGNOSIS — B20 Human immunodeficiency virus [HIV] disease: Secondary | ICD-10-CM

## 2021-05-20 ENCOUNTER — Encounter: Payer: Self-pay | Admitting: Orthopedic Surgery

## 2021-05-20 NOTE — Progress Notes (Signed)
Office Visit Note   Patient: [name]           Date of Birth: 1966-02-13           MRN: [id number] Visit Date: 05/08/2021              Requested by: Danna Hefty, DO [address],  [place] [address] PCP: Danna Hefty, DO  Chief Complaint  Patient presents with   Right Ankle - Follow-up      HPI: Patient is a 55 year old gentleman who presents for evaluation for right ankle pain.  Patient previously had a uric acid of 7.0 and has been taking allopurinol and colchicine.  Patient states he has pain along the plantar aspect of his foot as well as heel pain.  Pain with start up.  Assessment & Plan: Visit Diagnoses:  1. Pain in right ankle and joints of right foot   2. Plantar fascial fibromatosis     Plan: Patient was given instructions for Achilles stretching to unload the plantar fascia.  Recommend arch supports to unload the small plantar fibromatosis and recommended Voltaren gel for anti-inflammatory.  Check uric acid at follow-up.  Follow-Up Instructions: Return in about 3 months (around 08/06/2021).   Ortho Exam  Patient is alert, oriented, no adenopathy, well-dressed, normal affect, normal respiratory effort. Examination patient has a good dorsalis pedis pulse he states that since he started taking the allopurinol he has had no gout symptoms.  Patient does have tenderness to palpation of the origin of the plantar fascia with dorsiflexion only to neutral.  Patient was given instructions and demonstrated Achilles stretching.  Patient does have a small plantar fibromatosis nodule 1 cm diameter and recommended conservative therapy.  Imaging: No results found. No images are attached to the encounter.  Labs: Lab Results  Component Value Date   HGBA1C 4.4 (L) 01/26/2017   HGBA1C 5.8 (H) 07/27/2012   ESRSEDRATE 8 06/10/2010   LABURIC 7.0 03/27/2021   REPTSTATUS 03/31/2021 FINAL 03/26/2021   GRAMSTAIN  07/01/2018    FEW WBC PRESENT,BOTH PMN  AND MONONUCLEAR NO ORGANISMS SEEN    CULT  03/26/2021    NO GROWTH 5 DAYS Performed at [facility], [place] [address], [place], [place] [address]      Lab Results  Component Value Date   ALBUMIN 4.4 03/26/2021   ALBUMIN 4.1 03/13/2020   ALBUMIN 3.8 01/02/2019    Lab Results  Component Value Date   MG 2.2 03/29/2021   MG 2.0 01/26/2017   MG 2.1 03/23/2014   Lab Results  Component Value Date   VD25OH 22.6 (L) 01/26/2017    No results found for: PREALBUMIN CBC EXTENDED Latest Ref Rng & Units 03/28/2021 03/26/2021 01/20/2021  WBC 4.0 - 10.5 K/uL 8.8 8.3 7.6  RBC 4.22 - 5.81 MIL/uL 4.29 4.78 4.53  HGB 13.0 - 17.0 g/dL 12.8(L) 14.2 13.9  HCT 39.0 - 52.0 % 39.7 43.7 40.7  PLT 150 - 400 K/uL 242 265 219  NEUTROABS 1.7 - 7.7 K/uL - 4.7 5.0  LYMPHSABS 0.7 - 4.0 K/uL - 2.8 2.0     There is no height or weight on file to calculate BMI.  Orders:  No orders of the defined types were placed in this encounter.  No orders of the defined types were placed in this encounter.    Procedures: No procedures performed  Clinical Data: No additional findings.  ROS:  All other systems negative, except as noted in  the HPI. Review of Systems  Objective: Vital Signs: There were no vitals taken for this visit.  Specialty Comments:  No specialty comments available.  PMFS History: Patient Active Problem List   Diagnosis Date Noted   Encounter for screening examination for sexually transmitted disease 05/05/2021   Traumatic arthritis of right ankle    Acute idiopathic gout involving toe of right foot    Septic arthritis of right ankle ([place]) 03/26/2021   Mixed hyperlipidemia 11/26/2019   Cirrhosis ([place]) 05/15/2019   Neurodermatitis 09/30/2017   Nonischemic cardiomyopathy ([place])    HIV (human immunodeficiency virus infection) ([place]) 01/25/2017   PTSD (post-traumatic stress disorder) 02/03/2014   ICD (implantable cardioverter-defibrillator), biventricular, in situ 01/23/2014    Paroxysmal ventricular fibrillation ([place]) 01/19/2014   Protein-calorie malnutrition, severe ([place]) 02/13/2013   GERD (gastroesophageal reflux disease) 0000000   Chronic systolic heart failure ([place]) 01/25/2012   Alcohol abuse 11/30/2011   Genital herpes 06/09/2006   Chronic hepatitis C without hepatic coma ([place]) 06/09/2006   Primary hypertension 06/09/2006   Past Medical History:  Diagnosis Date   Chronic systolic heart failure ([place])    a. Echo 12/05/11:  EF 20-25%, diff HK with mild sparing of IL wall, mild AI, mod MR, mild LAE, mild RVE, mild reduced RVF.;  b.  Echo 05/11/2012:  Mild LVH, EF 20-25%, Gr 1 diast dysfn, mod MR, mild LAE   Depression    G6PD deficiency    GERD (gastroesophageal reflux disease)    Hepatitis    Hep B and Hep C (patient does not report this but these are listed in previous notes.)   HIV infection ([place])    Hypertension    NICM (nonischemic cardiomyopathy) ([place])    cardia CTA 8/13 negative for obstructive CAD   Presence of permanent cardiac pacemaker    VT (ventricular tachycardia)     Family History  Problem Relation Age of Onset   Lung cancer Mother    Colon cancer Other 105    Past Surgical History:  Procedure Laterality Date   BI-VENTRICULAR PACEMAKER INSERTION N/A 06/26/2013   Procedure: BI-VENTRICULAR PACEMAKER INSERTION (CRT-P);  Surgeon: Evans Lance, MD;  Location: [facility] CATH LAB;  Service: Cardiovascular;  Laterality: N/A;   BIV ICD GENERATOR CHANGEOUT N/A 01/24/2021   Procedure: BIV ICD GENERATOR CHANGEOUT;  Surgeon: Evans Lance, MD;  Location: [place] CV LAB;  Service: Cardiovascular;  Laterality: N/A;   COLONOSCOPY WITH PROPOFOL N/A 08/14/2016   Procedure: COLONOSCOPY WITH PROPOFOL;  Surgeon: Carol Ada, MD;  Location: [facility] ENDOSCOPY;  Service: Endoscopy;  Laterality: N/A;   ELECTROPHYSIOLOGY STUDY N/A 02/19/2012   Procedure: ELECTROPHYSIOLOGY STUDY;  Surgeon: Evans Lance, MD;  Location: [facility] CATH LAB;  Service: Cardiovascular;   Laterality: N/A;   ESOPHAGOGASTRODUODENOSCOPY  12/07/2011   Procedure: ESOPHAGOGASTRODUODENOSCOPY (EGD);  Surgeon: Ladene Artist, MD,FACG;  Location: [facility] ENDOSCOPY;  Service: Endoscopy;  Laterality: N/A;   FINGER SURGERY     Thumb laceration.     HARDWARE REMOVAL Right 07/01/2018   Procedure: HARDWARE REMOVAL;  Surgeon: Renette Butters, MD;  Location: [place];  Service: Orthopedics;  Laterality: Right;   INCISION AND DRAINAGE Right 07/01/2018   Procedure: INCISION AND DRAINAGE;  Surgeon: Renette Butters, MD;  Location: [place];  Service: Orthopedics;  Laterality: Right;   LEFT HEART CATHETERIZATION WITH CORONARY ANGIOGRAM N/A 01/31/2014   Procedure: LEFT HEART CATHETERIZATION WITH CORONARY ANGIOGRAM;  Surgeon: Wellington Hampshire, MD;  Location: [place] CATH LAB;  Service:  Cardiovascular;  Laterality: N/A;   ORIF ANKLE FRACTURE Right 01/26/2017   Procedure: OPEN REDUCTION INTERNAL FIXATION (ORIF) ANKLE FRACTURE;  Surgeon: Sheral Apley, MD;  Location: [facility] OR;  Service: Orthopedics;  Laterality: Right;   Social History   Occupational History   Occupation: Unemployed  Tobacco Use   Smoking status: Never   Smokeless tobacco: Never  Vaping Use   Vaping Use: Never used  Substance and Sexual Activity   Alcohol use: Yes    Alcohol/week: 1.0 standard drink    Types: 1 Cans of beer per week    Comment: occ beer   Drug use: Not Currently    Frequency: 7.0 times per week    Types: Marijuana    Comment: 1x/week, smoked day before surgery   Sexual activity: Yes    Partners: Male    Comment: accepted condoms

## 2021-05-21 ENCOUNTER — Encounter: Payer: Self-pay | Admitting: Orthopedic Surgery

## 2021-05-21 NOTE — Progress Notes (Signed)
Office Visit Note   Patient: [name]           Date of Birth: 1965/12/21           MRN: [id number] Visit Date: 04/10/2021              Requested by: Danna Hefty, DO [address],  [place] [address] PCP: Danna Hefty, DO  Chief Complaint  Patient presents with   Right Ankle - Pain    Went to ED septic right ankle arthritis      HPI: Patient is a 55 year old gentleman who is seen for initial evaluation for inflammation and pain of the right ankle.  A MRI scan has shown osteochondral defects.  Patient was started on colchicine and prednisone.  Assessment & Plan: Visit Diagnoses:  1. Acute idiopathic gout of right ankle     Plan: We will start allopurinol repeat uric acid at follow-up.  Consider evaluation for steroid injection of the right ankle at follow-up.  Follow-Up Instructions: Return in about 4 weeks (around 05/08/2021).   Ortho Exam  Patient is alert, oriented, no adenopathy, well-dressed, normal affect, normal respiratory effort. Most recent uric acid was 7.0.  Patient states his ankle feels sore he has a good dorsalis pedis pulse there is currently no effusion no cellulitis.  Imaging: No results found. No images are attached to the encounter.  Labs: Lab Results  Component Value Date   HGBA1C 4.4 (L) 01/26/2017   HGBA1C 5.8 (H) 07/27/2012   ESRSEDRATE 8 06/10/2010   LABURIC 7.0 03/27/2021   REPTSTATUS 03/31/2021 FINAL 03/26/2021   GRAMSTAIN  07/01/2018    FEW WBC PRESENT,BOTH PMN AND MONONUCLEAR NO ORGANISMS SEEN    CULT  03/26/2021    NO GROWTH 5 DAYS Performed at [facility], [place] [address], [place], [place] [address]      Lab Results  Component Value Date   ALBUMIN 4.4 03/26/2021   ALBUMIN 4.1 03/13/2020   ALBUMIN 3.8 01/02/2019    Lab Results  Component Value Date   MG 2.2 03/29/2021   MG 2.0 01/26/2017   MG 2.1 03/23/2014   Lab Results  Component Value Date   VD25OH 22.6 (L) 01/26/2017     No results found for: PREALBUMIN CBC EXTENDED Latest Ref Rng & Units 03/28/2021 03/26/2021 01/20/2021  WBC 4.0 - 10.5 K/uL 8.8 8.3 7.6  RBC 4.22 - 5.81 MIL/uL 4.29 4.78 4.53  HGB 13.0 - 17.0 g/dL 12.8(L) 14.2 13.9  HCT 39.0 - 52.0 % 39.7 43.7 40.7  PLT 150 - 400 K/uL 242 265 219  NEUTROABS 1.7 - 7.7 K/uL - 4.7 5.0  LYMPHSABS 0.7 - 4.0 K/uL - 2.8 2.0     There is no height or weight on file to calculate BMI.  Orders:  No orders of the defined types were placed in this encounter.  Meds ordered this encounter  Medications   allopurinol (ZYLOPRIM) 100 MG tablet    Sig: Take 1 tablet (100 mg total) by mouth 2 (two) times daily.    Dispense:  60 tablet    Refill:  3     Procedures: No procedures performed  Clinical Data: No additional findings.  ROS:  All other systems negative, except as noted in the HPI. Review of Systems  Objective: Vital Signs: There were no vitals taken for this visit.  Specialty Comments:  No specialty comments available.  PMFS History: Patient Active Problem List   Diagnosis Date Noted  Encounter for screening examination for sexually transmitted disease 05/05/2021   Traumatic arthritis of right ankle    Acute idiopathic gout involving toe of right foot    Septic arthritis of right ankle (HCC) 03/26/2021   Mixed hyperlipidemia 11/26/2019   Cirrhosis (HCC) 05/15/2019   Neurodermatitis 09/30/2017   Nonischemic cardiomyopathy (HCC)    HIV (human immunodeficiency virus infection) (HCC) 01/25/2017   PTSD (post-traumatic stress disorder) 02/03/2014   ICD (implantable cardioverter-defibrillator), biventricular, in situ 01/23/2014   Paroxysmal ventricular fibrillation (HCC) 01/19/2014   Protein-calorie malnutrition, severe (HCC) 02/13/2013   GERD (gastroesophageal reflux disease) 08/19/2012   Chronic systolic heart failure (HCC) 01/25/2012   Alcohol abuse 11/30/2011   Genital herpes 06/09/2006   Chronic hepatitis C without hepatic coma  (HCC) 06/09/2006   Primary hypertension 06/09/2006   Past Medical History:  Diagnosis Date   Chronic systolic heart failure (HCC)    a. Echo 12/05/11:  EF 20-25%, diff HK with mild sparing of IL wall, mild AI, mod MR, mild LAE, mild RVE, mild reduced RVF.;  b.  Echo 05/11/2012:  Mild LVH, EF 20-25%, Gr 1 diast dysfn, mod MR, mild LAE   Depression    G6PD deficiency    GERD (gastroesophageal reflux disease)    Hepatitis    Hep B and Hep C (patient does not report this but these are listed in previous notes.)   HIV infection (HCC)    Hypertension    NICM (nonischemic cardiomyopathy) (HCC)    cardia CTA 8/13 negative for obstructive CAD   Presence of permanent cardiac pacemaker    VT (ventricular tachycardia)     Family History  Problem Relation Age of Onset   Lung cancer Mother    Colon cancer Other 14    Past Surgical History:  Procedure Laterality Date   BI-VENTRICULAR PACEMAKER INSERTION N/A 06/26/2013   Procedure: BI-VENTRICULAR PACEMAKER INSERTION (CRT-P);  Surgeon: Marinus Maw, MD;  Location: [facility] CATH LAB;  Service: Cardiovascular;  Laterality: N/A;   BIV ICD GENERATOR CHANGEOUT N/A 01/24/2021   Procedure: BIV ICD GENERATOR CHANGEOUT;  Surgeon: Marinus Maw, MD;  Location: [facility] INVASIVE CV LAB;  Service: Cardiovascular;  Laterality: N/A;   COLONOSCOPY WITH PROPOFOL N/A 08/14/2016   Procedure: COLONOSCOPY WITH PROPOFOL;  Surgeon: Jeani Hawking, MD;  Location: [facility] ENDOSCOPY;  Service: Endoscopy;  Laterality: N/A;   ELECTROPHYSIOLOGY STUDY N/A 02/19/2012   Procedure: ELECTROPHYSIOLOGY STUDY;  Surgeon: Marinus Maw, MD;  Location: [facility] CATH LAB;  Service: Cardiovascular;  Laterality: N/A;   ESOPHAGOGASTRODUODENOSCOPY  12/07/2011   Procedure: ESOPHAGOGASTRODUODENOSCOPY (EGD);  Surgeon: [name], MD,FACG;  Location: [facility] ENDOSCOPY;  Service: Endoscopy;  Laterality: N/A;   FINGER SURGERY     Thumb laceration.     HARDWARE REMOVAL Right 07/01/2018   Procedure: HARDWARE REMOVAL;   Surgeon: Sheral Apley, MD;  Location: [facility];  Service: Orthopedics;  Laterality: Right;   INCISION AND DRAINAGE Right 07/01/2018   Procedure: INCISION AND DRAINAGE;  Surgeon: Sheral Apley, MD;  Location: [facility];  Service: Orthopedics;  Laterality: Right;   LEFT HEART CATHETERIZATION WITH CORONARY ANGIOGRAM N/A 01/31/2014   Procedure: LEFT HEART CATHETERIZATION WITH CORONARY ANGIOGRAM;  Surgeon: Iran Ouch, MD;  Location: [facility] CATH LAB;  Service: Cardiovascular;  Laterality: N/A;   ORIF ANKLE FRACTURE Right 01/26/2017   Procedure: OPEN REDUCTION INTERNAL FIXATION (ORIF) ANKLE FRACTURE;  Surgeon: Sheral Apley, MD;  Location: [facility] OR;  Service: Orthopedics;  Laterality: Right;  Social History   Occupational History   Occupation: Unemployed  Tobacco Use   Smoking status: Never   Smokeless tobacco: Never  Vaping Use   Vaping Use: Never used  Substance and Sexual Activity   Alcohol use: Yes    Alcohol/week: 1.0 standard drink    Types: 1 Cans of beer per week    Comment: occ beer   Drug use: Not Currently    Frequency: 7.0 times per week    Types: Marijuana    Comment: 1x/week, smoked day before surgery   Sexual activity: Yes    Partners: Male    Comment: accepted condoms

## 2021-05-29 ENCOUNTER — Other Ambulatory Visit: Payer: Self-pay | Admitting: Internal Medicine

## 2021-05-29 NOTE — Telephone Encounter (Signed)
Please acknowledge refill request with approval or denial.  Thanks

## 2021-06-04 ENCOUNTER — Telehealth: Payer: Self-pay | Admitting: Orthopedic Surgery

## 2021-06-04 ENCOUNTER — Other Ambulatory Visit: Payer: Self-pay | Admitting: Orthopedic Surgery

## 2021-06-04 DIAGNOSIS — M10071 Idiopathic gout, right ankle and foot: Secondary | ICD-10-CM

## 2021-06-04 MED ORDER — COLCHICINE 0.6 MG PO TABS: ORAL_TABLET | ORAL | 0 refills | Status: DC

## 2021-06-04 NOTE — Telephone Encounter (Signed)
Pt called about his gout medication. Please call pt back about this matter before the end of this work day pt is asking. Pt phone number is [phone]

## 2021-06-04 NOTE — Telephone Encounter (Signed)
Pt would like to know if you can refill his colchicine 0.6 mg for gout?  CB [phone]

## 2021-06-04 NOTE — Telephone Encounter (Signed)
Duplicate message, has already called regarding this matter once today. Closing this note out.

## 2021-06-04 NOTE — Telephone Encounter (Signed)
This has been done.

## 2021-06-23 ENCOUNTER — Other Ambulatory Visit: Payer: Self-pay | Admitting: Orthopedic Surgery

## 2021-06-23 DIAGNOSIS — M10071 Idiopathic gout, right ankle and foot: Secondary | ICD-10-CM

## 2021-06-26 ENCOUNTER — Telehealth: Payer: Self-pay

## 2021-06-26 ENCOUNTER — Other Ambulatory Visit: Payer: Self-pay | Admitting: Pharmacist

## 2021-06-26 DIAGNOSIS — B009 Herpesviral infection, unspecified: Secondary | ICD-10-CM

## 2021-06-26 MED ORDER — VALACYCLOVIR HCL 1 G PO TABS: 1000.0000 mg | ORAL_TABLET | Freq: Two times a day (BID) | ORAL | 0 refills | Status: AC

## 2021-06-26 NOTE — Telephone Encounter (Signed)
Sent in Valtrex 1gm PO BID x 5 days for outbreak.   Ketrick Matney L. Ronesha Heenan, PharmD [facility] Clinical Pharmacist Practitioner

## 2021-06-26 NOTE — Telephone Encounter (Signed)
Patient called complaining of a blister on his lip. Patient requesting a new prescription for Valtrex. Patient has not filled the medication since 2020. Please advise. Patient would like RX to go to [facility] on [address]

## 2021-07-03 ENCOUNTER — Ambulatory Visit (INDEPENDENT_AMBULATORY_CARE_PROVIDER_SITE_OTHER): Payer: Medicare Other | Admitting: Student

## 2021-07-03 ENCOUNTER — Encounter: Payer: Self-pay | Admitting: Student

## 2021-07-03 ENCOUNTER — Other Ambulatory Visit: Payer: Self-pay

## 2021-07-03 VITALS — BP 114/93 | HR 87 | Ht 64.5 in | Wt 184.4 lb

## 2021-07-03 DIAGNOSIS — G4700 Insomnia, unspecified: Secondary | ICD-10-CM

## 2021-07-03 DIAGNOSIS — I10 Essential (primary) hypertension: Secondary | ICD-10-CM | POA: Diagnosis not present

## 2021-07-03 DIAGNOSIS — E782 Mixed hyperlipidemia: Secondary | ICD-10-CM | POA: Diagnosis not present

## 2021-07-03 MED ORDER — TRAZODONE HCL 50 MG PO TABS: 50.0000 mg | ORAL_TABLET | Freq: Every day | ORAL | 0 refills | Status: DC

## 2021-07-03 NOTE — Assessment & Plan Note (Signed)
BP 114/93 today, with diastolic htn noted on last BP 27/78/24, but not BP hypotensive on two readings before then (98/60, 105/70). Will continue to monitor to determine if antihypertensives need to be adjusted -Continue to monitor -BMP today

## 2021-07-03 NOTE — Patient Instructions (Signed)
It was great to see you! Thank you for allowing me to participate in your care!    Our plans for today:  - BMP, Lipid panel - Try Trazodone for sleep - Return to discuss breast cancer screening    We are checking some labs today, I will call you if they are abnormal will send you a MyChart message or a letter if they are normal.  If you do not hear about your labs in the next 2 weeks please let us know.  Take care and seek immediate care sooner if you develop any concerns.   Dr. Bess Kinds, MD [facility]

## 2021-07-03 NOTE — Assessment & Plan Note (Signed)
Patient reports history of sleep disturbance, problem falling and staying asleep. Has tried ambien, temazepam, and doxepin.  -D/c doxepin -Start Trazodone

## 2021-07-03 NOTE — Assessment & Plan Note (Signed)
Patient last lipid panel abnormal, patient is good candidate for statin, but given lack of diabetes, primary prevention not necessary in patient. Will consider statin, patient open to taking if lipid panel indicates use. ASCVD risk 15.6% -Lipid panel -Consider stating (crestor 5-10 mg)

## 2021-07-03 NOTE — Progress Notes (Signed)
Marland Kitchen SUBJECTIVE:   CHIEF COMPLAINT / HPI:      SUBJECTIVE:   Chief compliant/HPI: annual examination  [name] is a 56 y.o. who presents today for an annual exam.    HTN BP 99991111, some diastolic htn, but not noted in all 3 last BP readings.  BP Readings from Last 3 Encounters:  07/03/21 (!) 114/93  05/05/21 122/90  04/29/21 98/60    HLD Lab Results  Component Value Date   CHOL 218 (H) 11/01/2019   HDL 71 11/01/2019   LDLCALC 108 (H) 11/01/2019   TRIG 296 (H) 11/01/2019   CHOLHDL 3.1 11/01/2019  -Repeat lipid profile The 10-year ASCVD risk score (Arnett DK, et al., 2019) is: 15.6%   Values used to calculate the score:     Age: 83 years     Sex: Male     Is Non-Hispanic African American: Yes     Diabetic: No     Tobacco smoker: Yes     Systolic Blood Pressure: 99991111 mmHg     Is BP treated: Yes     HDL Cholesterol: 71 mg/dL     Total Cholesterol: 218 mg/dL   HIV - See ID doctor every 6 months Hep C - sees ID doctor every 6 months     -Cured of Hep C per patient (took the medications for it), patient has cirrhosis as a problem in his chart, but says he never had Cirrhosis.  HF / paroxysmal V. Fib - sees Cardiologist every year  Insomnia: Has trouble sleeping, cant fall asleep/stay asleep. Goes to sleep around 11 pm and gets up around 11 am. Has been trying doxepin with no relief. Used to be on ambien, then was switched to benzodiazapine. Not sure why his meds where changed. Prescribing doctor no longer witting him for temazepam.     Cancer screening: Patient with family history of breast cancer, wanting to discuss/screen. Encouraged to make follow up appointment.   PERTINENT  PMH / PSH: HTN, HLD, CHF, Cirrhosis, HIV, Hx of Hep C  Past Medical History:  Diagnosis Date   Chronic systolic heart failure ([place])    a. Echo 12/05/11:  EF 20-25%, diff HK with mild sparing of IL wall, mild AI, mod MR, mild LAE, mild RVE, mild reduced RVF.;  b.  Echo 05/11/2012:  Mild  LVH, EF 20-25%, Gr 1 diast dysfn, mod MR, mild LAE   Depression    G6PD deficiency    GERD (gastroesophageal reflux disease)    Hepatitis    Hep B and Hep C (patient does not report this but these are listed in previous notes.)   HIV infection ([place])    Hypertension    NICM (nonischemic cardiomyopathy) ([place])    cardia CTA 8/13 negative for obstructive CAD   Presence of permanent cardiac pacemaker    VT (ventricular tachycardia)     Past Surgical History:  Procedure Laterality Date   BI-VENTRICULAR PACEMAKER INSERTION N/A 06/26/2013   Procedure: BI-VENTRICULAR PACEMAKER INSERTION (CRT-P);  Surgeon: Evans Lance, MD;  Location: [facility] CATH LAB;  Service: Cardiovascular;  Laterality: N/A;   BIV ICD GENERATOR CHANGEOUT N/A 01/24/2021   Procedure: BIV ICD GENERATOR CHANGEOUT;  Surgeon: Evans Lance, MD;  Location: [facility] CV LAB;  Service: Cardiovascular;  Laterality: N/A;   COLONOSCOPY WITH PROPOFOL N/A 08/14/2016   Procedure: COLONOSCOPY WITH PROPOFOL;  Surgeon: Carol Ada, MD;  Location: [facility] ENDOSCOPY;  Service: Endoscopy;  Laterality: N/A;   ELECTROPHYSIOLOGY STUDY N/A 02/19/2012  Procedure: ELECTROPHYSIOLOGY STUDY;  Surgeon: Evans Lance, MD;  Location: [facility] CATH LAB;  Service: Cardiovascular;  Laterality: N/A;   ESOPHAGOGASTRODUODENOSCOPY  12/07/2011   Procedure: ESOPHAGOGASTRODUODENOSCOPY (EGD);  Surgeon: Ladene Artist, MD,FACG;  Location: [facility] ENDOSCOPY;  Service: Endoscopy;  Laterality: N/A;   FINGER SURGERY     Thumb laceration.     HARDWARE REMOVAL Right 07/01/2018   Procedure: HARDWARE REMOVAL;  Surgeon: Renette Butters, MD;  Location: [facility];  Service: Orthopedics;  Laterality: Right;   INCISION AND DRAINAGE Right 07/01/2018   Procedure: INCISION AND DRAINAGE;  Surgeon: Renette Butters, MD;  Location: [facility];  Service: Orthopedics;  Laterality: Right;   LEFT HEART CATHETERIZATION WITH CORONARY ANGIOGRAM N/A 01/31/2014   Procedure: LEFT  HEART CATHETERIZATION WITH CORONARY ANGIOGRAM;  Surgeon: Wellington Hampshire, MD;  Location: [facility] CATH LAB;  Service: Cardiovascular;  Laterality: N/A;   ORIF ANKLE FRACTURE Right 01/26/2017   Procedure: OPEN REDUCTION INTERNAL FIXATION (ORIF) ANKLE FRACTURE;  Surgeon: Renette Butters, MD;  Location: [facility];  Service: Orthopedics;  Laterality: Right;    OBJECTIVE:  BP (!) 114/93    Pulse 87    Ht 5' 4.5" (1.638 m)    Wt 184 lb 6.4 oz (83.6 kg)    SpO2 98%    BMI 31.16 kg/m   General: NAD, pleasant, able to participate in exam Cardiac: RRR, no murmurs auscultated. Respiratory: CTAB, normal effort, no wheezes, rales or rhonchi Abdomen: soft, non-tender, non-distended, normoactive bowel sounds Extremities: warm and well perfused, no edema or cyanosis. Neuro: alert, no obvious focal deficits, speech normal Psych: Normal affect and mood  ASSESSMENT/PLAN:  Primary hypertension BP 114/93 today, with diastolic htn noted on last BP 05/05/21, but not BP hypotensive on two readings before then (98/60, 105/70). Will continue to monitor to determine if antihypertensives need to be adjusted -Continue to monitor -BMP today   Mixed hyperlipidemia Patient last lipid panel abnormal, patient is good candidate for statin, but given lack of diabetes, primary prevention not necessary in patient. Will consider statin, patient open to taking if lipid panel indicates use. ASCVD risk 15.6% -Lipid panel -Consider stating (crestor 5-10 mg)  Insomnia Patient reports history of sleep disturbance, problem falling and staying asleep. Has tried ambien, temazepam, and doxepin.  -D/c doxepin -Start Trazodone    Orders Placed This Encounter  Procedures   Coronavirus ([id number]) with Influenza A and Influenza B    Order Specific Question:   Previously tested for COVID-19    Answer:   Yes    Order Specific Question:   Resident in a congregate (group) care setting    Answer:   Unknown    Order Specific Question:   Is  the patient student?    Answer:   No    Order Specific Question:   Employed in healthcare setting    Answer:   No    Order Specific Question:   Has patient completed COVID vaccination(s) (2 doses of Pfizer/Moderna 1 dose of [employer])    Answer:   Unknown   Lipid Panel   Comprehensive metabolic panel   Meds ordered this encounter  Medications   traZODone (DESYREL) 50 MG tablet    Sig: Take 1 tablet (50 mg total) by mouth at bedtime.    Dispense:  30 tablet    Refill:  0   No follow-ups on file. @SIGNNOTE @

## 2021-07-04 LAB — COMPREHENSIVE METABOLIC PANEL
ALT: 23 IU/L (ref 0–44)
AST: 25 IU/L (ref 0–40)
Albumin/Globulin Ratio: 1.8 (ref 1.2–2.2)
Albumin: 5 g/dL — ABNORMAL HIGH (ref 3.8–4.9)
Alkaline Phosphatase: 86 IU/L (ref 44–121)
BUN/Creatinine Ratio: 13 (ref 9–20)
BUN: 14 mg/dL (ref 6–24)
Bilirubin Total: 0.3 mg/dL (ref 0.0–1.2)
CO2: 20 mmol/L (ref 20–29)
Calcium: 10.2 mg/dL (ref 8.7–10.2)
Chloride: 107 mmol/L — ABNORMAL HIGH (ref 96–106)
Creatinine, Ser: 1.06 mg/dL (ref 0.76–1.27)
Globulin, Total: 2.8 g/dL (ref 1.5–4.5)
Glucose: 101 mg/dL — ABNORMAL HIGH (ref 70–99)
Potassium: 4.1 mmol/L (ref 3.5–5.2)
Sodium: 142 mmol/L (ref 134–144)
Total Protein: 7.8 g/dL (ref 6.0–8.5)
eGFR: 81 mL/min/{1.73_m2} (ref >59)

## 2021-07-04 LAB — LIPID PANEL
Chol/HDL Ratio: 3.5 ratio (ref 0.0–5.0)
Cholesterol, Total: 199 mg/dL (ref 100–199)
HDL: 57 mg/dL (ref >39)
LDL Chol Calc (NIH): 117 mg/dL — ABNORMAL HIGH (ref 0–99)
Triglycerides: 142 mg/dL (ref 0–149)
VLDL Cholesterol Cal: 25 mg/dL (ref 5–40)

## 2021-07-05 LAB — COVID-19, FLU A+B NAA
Influenza A, NAA: NOT DETECTED
Influenza B, NAA: NOT DETECTED
SARS-CoV-2, NAA: DETECTED — AB

## 2021-07-07 ENCOUNTER — Telehealth: Payer: Self-pay

## 2021-07-07 MED ORDER — ROSUVASTATIN CALCIUM 5 MG PO TABS: 5.0000 mg | ORAL_TABLET | Freq: Every day | ORAL | 11 refills | Status: DC

## 2021-07-07 NOTE — Telephone Encounter (Signed)
Patient calls nurse line requesting returned phone call from PCP regarding test results from 07/03/21.  Please advise.   Veronda Prude, RN

## 2021-07-07 NOTE — Addendum Note (Signed)
Addended by: Bess Kinds T on: 07/07/2021 02:13 PM   Modules accepted: Orders

## 2021-07-09 ENCOUNTER — Telehealth: Payer: Self-pay | Admitting: Orthopedic Surgery

## 2021-07-09 ENCOUNTER — Other Ambulatory Visit: Payer: Self-pay | Admitting: Orthopedic Surgery

## 2021-07-09 DIAGNOSIS — M10071 Idiopathic gout, right ankle and foot: Secondary | ICD-10-CM

## 2021-07-09 MED ORDER — COLCHICINE 0.6 MG PO TABS: ORAL_TABLET | ORAL | 0 refills | Status: DC

## 2021-07-09 NOTE — Telephone Encounter (Signed)
Patient called advised he is down to 3 tabs. Patient said he took the tabs as prescribed. 1 tab twice daily. Patient said the Rx doesn't allow for the whole month before running out. The number to contact patient is [phone]

## 2021-07-09 NOTE — Telephone Encounter (Signed)
Colchicine updated to #60 for 30 d/s. Sent to pharmacy. Pt informed.

## 2021-07-15 ENCOUNTER — Telehealth: Payer: Self-pay

## 2021-07-15 NOTE — Telephone Encounter (Signed)
Patient calls nurse line regarding follow up from positive COVID test on 07/03/21. Provided patient with CDC recommendations. Patient is requesting repeat COVID testing. Advised patient that we typically do not recommend retesting as you can still test positive for a while after being infectious. Patient states, "I need to know if it is gone, schedule me an appointment."   Scheduled patient for next available with PCP on Monday, 2/20.  Talbot Grumbling, RN

## 2021-07-19 ENCOUNTER — Other Ambulatory Visit (HOSPITAL_COMMUNITY): Payer: Self-pay | Admitting: Internal Medicine

## 2021-07-21 ENCOUNTER — Other Ambulatory Visit: Payer: Self-pay

## 2021-07-21 ENCOUNTER — Encounter: Payer: Self-pay | Admitting: Student

## 2021-07-21 ENCOUNTER — Ambulatory Visit (INDEPENDENT_AMBULATORY_CARE_PROVIDER_SITE_OTHER): Payer: Medicare Other | Admitting: Student

## 2021-07-21 VITALS — BP 127/91 | HR 71 | Temp 98.2°F | Wt 185.4 lb

## 2021-07-21 DIAGNOSIS — Z20822 Contact with and (suspected) exposure to covid-19: Secondary | ICD-10-CM

## 2021-07-21 NOTE — Patient Instructions (Addendum)
It was great to see you! Thank you for allowing me to participate in your care!  Our plans for today:  - COVID testing - Immunization at 3 months (1.5 more months)  We are checking some labs today, I will call you if they are abnormal will send you a MyChart message or a letter if they are normal.  If you do not hear about your labs in the next 2 weeks please let us know.  Take care and seek immediate care sooner if you develop any concerns.   Dr. Bess Kinds, MD [facility]

## 2021-07-21 NOTE — Progress Notes (Signed)
SUBJECTIVE:   CHIEF COMPLAINT / HPI:   Patient here for Covid testing. Patient was Covid + at last visit, reports symptoms started greater than 2 weeks prior to testing. Patient wanting to be tested today, to see if he cleared infection. [email or web address] [id number]) ([email or web address])   Isolation precautions for immunocompetent patients with COVID-19 - UpToDate   Patient is having no symptoms, no cough, runny nose, fever, congestion, n/v/d. Feels in good health. Wanting to be tested to see if virus cleared. Informed that it could take a few weeks for virus to clear. Patient also wanting 4th booster today. Informed him the it's recommended he wait 3 months. Patient agreeable.    PERTINENT  PMH / PSH: CHF, Depression, HTN, HIV, COVID  Past Medical History:  Diagnosis Date   Chronic systolic heart failure ([place])    a. Echo 12/05/11:  EF 20-25%, diff HK with mild sparing of IL wall, mild AI, mod MR, mild LAE, mild RVE, mild reduced RVF.;  b.  Echo 05/11/2012:  Mild LVH, EF 20-25%, Gr 1 diast dysfn, mod MR, mild LAE   Depression    G6PD deficiency    GERD (gastroesophageal reflux disease)    Hepatitis    Hep B and Hep C (patient does not report this but these are listed in previous notes.)   HIV infection ([place])    Hypertension    NICM (nonischemic cardiomyopathy) ([place])    cardia CTA 8/13 negative for obstructive CAD   Presence of permanent cardiac pacemaker    VT (ventricular tachycardia)     Past Surgical History:  Procedure Laterality Date   BI-VENTRICULAR PACEMAKER INSERTION N/A 06/26/2013   Procedure: BI-VENTRICULAR PACEMAKER INSERTION (CRT-P);  Surgeon: Evans Lance, MD;  Location: [facility] CATH LAB;  Service: Cardiovascular;  Laterality: N/A;   BIV ICD GENERATOR CHANGEOUT N/A 01/24/2021   Procedure: BIV ICD GENERATOR CHANGEOUT;  Surgeon: Evans Lance, MD;  Location: [facility] CV LAB;  Service: Cardiovascular;  Laterality: N/A;   COLONOSCOPY WITH PROPOFOL N/A 08/14/2016    Procedure: COLONOSCOPY WITH PROPOFOL;  Surgeon: Carol Ada, MD;  Location: [facility] ENDOSCOPY;  Service: Endoscopy;  Laterality: N/A;   ELECTROPHYSIOLOGY STUDY N/A 02/19/2012   Procedure: ELECTROPHYSIOLOGY STUDY;  Surgeon: Evans Lance, MD;  Location: [facility] CATH LAB;  Service: Cardiovascular;  Laterality: N/A;   ESOPHAGOGASTRODUODENOSCOPY  12/07/2011   Procedure: ESOPHAGOGASTRODUODENOSCOPY (EGD);  Surgeon: Ladene Artist, MD,FACG;  Location: [facility] ENDOSCOPY;  Service: Endoscopy;  Laterality: N/A;   FINGER SURGERY     Thumb laceration.     HARDWARE REMOVAL Right 07/01/2018   Procedure: HARDWARE REMOVAL;  Surgeon: Renette Butters, MD;  Location: [place];  Service: Orthopedics;  Laterality: Right;   INCISION AND DRAINAGE Right 07/01/2018   Procedure: INCISION AND DRAINAGE;  Surgeon: Renette Butters, MD;  Location: [place];  Service: Orthopedics;  Laterality: Right;   LEFT HEART CATHETERIZATION WITH CORONARY ANGIOGRAM N/A 01/31/2014   Procedure: LEFT HEART CATHETERIZATION WITH CORONARY ANGIOGRAM;  Surgeon: Wellington Hampshire, MD;  Location: [place] CATH LAB;  Service: Cardiovascular;  Laterality: N/A;   ORIF ANKLE FRACTURE Right 01/26/2017   Procedure: OPEN REDUCTION INTERNAL FIXATION (ORIF) ANKLE FRACTURE;  Surgeon: Renette Butters, MD;  Location: [facility];  Service: Orthopedics;  Laterality: Right;    OBJECTIVE:  BP (!) 127/91    Pulse 71    Temp 98.2 F (36.8 C)    Wt 84.1 kg    SpO2 98%    BMI 31.33  kg/m   General: NAD, pleasant, able to participate in exam Cardiac: RRR, no murmurs auscultated. Respiratory: CTAB, normal effort, no wheezes, rales or rhonchi Abdomen: soft, non-tender, non-distended, normoactive bowel sounds Extremities: warm and well perfused, no edema or cyanosis. Skin: warm and dry, no rashes noted Neuro: alert, no obvious focal deficits, speech normal Psych: Normal affect and mood  ASSESSMENT/PLAN:  No problem-specific Assessment & Plan notes found  for this encounter.   Concern for COVID -Novel Covid test  Covid Booster -Will wait 3 months (1.5 more months)  Orders Placed This Encounter  Procedures   Novel Coronavirus, NAA ([facility])    Order Specific Question:   Previously tested for COVID-19    Answer:   Yes    Order Specific Question:   Resident in a congregate (group) care setting    Answer:   Unknown    Order Specific Question:   Is the patient student?    Answer:   No    Order Specific Question:   Employed in healthcare setting    Answer:   Unknown    Order Specific Question:   Has patient completed COVID vaccination(s) (2 doses of Pfizer/Moderna 1 dose of [employer])    Answer:   Unknown   No orders of the defined types were placed in this encounter.  No follow-ups on file. @SIGNNOTE @

## 2021-07-22 LAB — NOVEL CORONAVIRUS, NAA: SARS-CoV-2, NAA: NOT DETECTED

## 2021-07-29 ENCOUNTER — Ambulatory Visit (INDEPENDENT_AMBULATORY_CARE_PROVIDER_SITE_OTHER): Payer: Medicare Other

## 2021-07-29 DIAGNOSIS — I428 Other cardiomyopathies: Secondary | ICD-10-CM

## 2021-07-30 ENCOUNTER — Other Ambulatory Visit: Payer: Self-pay | Admitting: Orthopedic Surgery

## 2021-07-30 ENCOUNTER — Other Ambulatory Visit: Payer: Self-pay | Admitting: Student

## 2021-07-30 LAB — CUP PACEART REMOTE DEVICE CHECK
Battery Remaining Longevity: 100 mo
Battery Voltage: 3.03 V
Brady Statistic AP VP Percent: 0.05 %
Brady Statistic AP VS Percent: 0.02 %
Brady Statistic AS VP Percent: 98.57 %
Brady Statistic AS VS Percent: 1.36 %
Brady Statistic RA Percent Paced: 0.06 %
Brady Statistic RV Percent Paced: 1.37 %
Date Time Interrogation Session: 20230228093326
HighPow Impedance: 84 Ohm
Implantable Lead Implant Date: 20150126
Implantable Lead Implant Date: 20150126
Implantable Lead Implant Date: 20150126
Implantable Lead Location: 753858
Implantable Lead Location: 753859
Implantable Lead Location: 753860
Implantable Lead Model: 4396
Implantable Lead Model: 5076
Implantable Lead Model: 6935
Implantable Pulse Generator Implant Date: 20220826
Lead Channel Impedance Value: 342 Ohm
Lead Channel Impedance Value: 380 Ohm
Lead Channel Impedance Value: 399 Ohm
Lead Channel Impedance Value: 456 Ohm
Lead Channel Impedance Value: 722 Ohm
Lead Channel Impedance Value: 893 Ohm
Lead Channel Pacing Threshold Amplitude: 0.625 V
Lead Channel Pacing Threshold Amplitude: 0.75 V
Lead Channel Pacing Threshold Amplitude: 1.25 V
Lead Channel Pacing Threshold Pulse Width: 0.4 ms
Lead Channel Pacing Threshold Pulse Width: 0.4 ms
Lead Channel Pacing Threshold Pulse Width: 0.4 ms
Lead Channel Sensing Intrinsic Amplitude: 1.375 mV
Lead Channel Sensing Intrinsic Amplitude: 1.375 mV
Lead Channel Sensing Intrinsic Amplitude: 8 mV
Lead Channel Sensing Intrinsic Amplitude: 8 mV
Lead Channel Setting Pacing Amplitude: 1.5 V
Lead Channel Setting Pacing Amplitude: 2 V
Lead Channel Setting Pacing Amplitude: 2.5 V
Lead Channel Setting Pacing Pulse Width: 0.4 ms
Lead Channel Setting Pacing Pulse Width: 0.4 ms
Lead Channel Setting Sensing Sensitivity: 0.3 mV

## 2021-08-05 NOTE — Progress Notes (Signed)
Remote ICD transmission.   

## 2021-08-07 ENCOUNTER — Ambulatory Visit (INDEPENDENT_AMBULATORY_CARE_PROVIDER_SITE_OTHER): Payer: Medicare Other | Admitting: Orthopedic Surgery

## 2021-08-07 DIAGNOSIS — M722 Plantar fascial fibromatosis: Secondary | ICD-10-CM | POA: Diagnosis not present

## 2021-08-07 DIAGNOSIS — M25561 Pain in right knee: Secondary | ICD-10-CM

## 2021-08-07 DIAGNOSIS — M10071 Idiopathic gout, right ankle and foot: Secondary | ICD-10-CM

## 2021-08-10 ENCOUNTER — Encounter: Payer: Self-pay | Admitting: Orthopedic Surgery

## 2021-08-10 DIAGNOSIS — M25561 Pain in right knee: Secondary | ICD-10-CM | POA: Diagnosis not present

## 2021-08-10 MED ORDER — METHYLPREDNISOLONE ACETATE 40 MG/ML IJ SUSP
40.0000 mg | INTRAMUSCULAR | Status: AC | PRN
  Administered 2021-08-10: 40 mg via INTRA_ARTICULAR

## 2021-08-10 MED ORDER — LIDOCAINE HCL (PF) 1 % IJ SOLN
5.0000 mL | INTRAMUSCULAR | Status: AC | PRN
  Administered 2021-08-10: 5 mL

## 2021-08-10 NOTE — Progress Notes (Signed)
Office Visit Note   Patient: [name]           Date of Birth: 1965/08/21           MRN: [id number] Visit Date: 08/07/2021              Requested by: Danna Hefty, DO [address],  [place] [address] PCP: Holley Bouche, MD  Chief Complaint  Patient presents with   Right Ankle - Pain, Follow-up   Right Foot - Pain, Follow-up      HPI: Patient is a 56 year old gentleman who presents with right ankle pain as well as plantar fascial pain on the right.  Patient states he has had 2 gout flareups since the last office visit.  He has been using Voltaren gel doing Achilles stretching.  Patient also complains of acute pain over the MTP joint of the left great toe with a fusion of the right knee.  Assessment & Plan: Visit Diagnoses:  1. Plantar fasciitis, right   2. Acute pain of right knee   3. Acute idiopathic gout of right ankle     Plan: Right knee was injected he tolerated this well, left great toe pain most likely secondary to gout.  We will check a uric acid at follow-up.  Follow-Up Instructions: Return in about 4 weeks (around 09/04/2021).   Ortho Exam  Patient is alert, oriented, no adenopathy, well-dressed, normal affect, normal respiratory effort. Examination patient does have a mild effusion of the right knee there is tenderness to palpation over the medial and lateral joint line there is no redness no cellulitis.  Examination of the left great toe he does not have a bunion deformity he has large medial spurring and pain with range of motion most likely secondary to gout.  Patient has only 20 degrees of dorsiflexion of the left great toe with hallux rigidus.  Examination of the plantar aspect of the right foot there is no fibromatosis he has good dorsiflexion of the ankle good pulses.  He has an old ankle fracture with no pain with passive range of motion.  Imaging: No results found. No images are attached to the encounter.  Labs: Lab Results   Component Value Date   HGBA1C 4.4 (L) 01/26/2017   HGBA1C 5.8 (H) 07/27/2012   ESRSEDRATE 8 06/10/2010   LABURIC 7.0 03/27/2021   REPTSTATUS 03/31/2021 FINAL 03/26/2021   GRAMSTAIN  07/01/2018    FEW WBC PRESENT,BOTH PMN AND MONONUCLEAR NO ORGANISMS SEEN    CULT  03/26/2021    NO GROWTH 5 DAYS Performed at [facility], [place] [address] [place], [place] [address]      Lab Results  Component Value Date   ALBUMIN 5.0 (H) 07/03/2021   ALBUMIN 4.4 03/26/2021   ALBUMIN 4.1 03/13/2020    Lab Results  Component Value Date   MG 2.2 03/29/2021   MG 2.0 01/26/2017   MG 2.1 03/23/2014   Lab Results  Component Value Date   VD25OH 22.6 (L) 01/26/2017    No results found for: PREALBUMIN CBC EXTENDED Latest Ref Rng & Units 03/28/2021 03/26/2021 01/20/2021  WBC 4.0 - 10.5 K/uL 8.8 8.3 7.6  RBC 4.22 - 5.81 MIL/uL 4.29 4.78 4.53  HGB 13.0 - 17.0 g/dL 12.8(L) 14.2 13.9  HCT 39.0 - 52.0 % 39.7 43.7 40.7  PLT 150 - 400 K/uL 242 265 219  NEUTROABS 1.7 - 7.7 K/uL - 4.7 5.0  LYMPHSABS 0.7 - 4.0 K/uL - 2.8  2.0     There is no height or weight on file to calculate BMI.  Orders:  No orders of the defined types were placed in this encounter.  No orders of the defined types were placed in this encounter.    Procedures: Large Joint Inj: R knee on 08/10/2021 11:35 AM Indications: pain and diagnostic evaluation Details: 22 G 1.5 in needle, anteromedial approach  Arthrogram: No  Medications: 5 mL lidocaine (PF) 1 %; 40 mg methylPREDNISolone acetate 40 MG/ML Outcome: tolerated well, no immediate complications Procedure, treatment alternatives, risks and benefits explained, specific risks discussed. Consent was given by the patient. Immediately prior to procedure a time out was called to verify the correct patient, procedure, equipment, support staff and site/side marked as required. Patient was prepped and draped in the usual sterile fashion.     Clinical Data: No additional  findings.  ROS:  All other systems negative, except as noted in the HPI. Review of Systems  Objective: Vital Signs: There were no vitals taken for this visit.  Specialty Comments:  No specialty comments available.  PMFS History: Patient Active Problem List   Diagnosis Date Noted   Encounter for screening examination for sexually transmitted disease 05/05/2021   Traumatic arthritis of right ankle    Acute idiopathic gout involving toe of right foot    Septic arthritis of right ankle ([place]) 03/26/2021   Mixed hyperlipidemia 11/26/2019   Cirrhosis ([place]) 05/15/2019   Neurodermatitis 09/30/2017   Nonischemic cardiomyopathy ([place])    HIV (human immunodeficiency virus infection) ([place]) 01/25/2017   Insomnia 06/26/2014   PTSD (post-traumatic stress disorder) 02/03/2014   ICD (implantable cardioverter-defibrillator), biventricular, in situ 01/23/2014   Paroxysmal ventricular fibrillation ([place]) 01/19/2014   Protein-calorie malnutrition, severe ([place]) 02/13/2013   GERD (gastroesophageal reflux disease) 0000000   Chronic systolic heart failure ([place]) 01/25/2012   Alcohol abuse 11/30/2011   Genital herpes 06/09/2006   Chronic hepatitis C without hepatic coma ([place]) 06/09/2006   Primary hypertension 06/09/2006   Past Medical History:  Diagnosis Date   Chronic systolic heart failure ([place])    a. Echo 12/05/11:  EF 20-25%, diff HK with mild sparing of IL wall, mild AI, mod MR, mild LAE, mild RVE, mild reduced RVF.;  b.  Echo 05/11/2012:  Mild LVH, EF 20-25%, Gr 1 diast dysfn, mod MR, mild LAE   Depression    G6PD deficiency    GERD (gastroesophageal reflux disease)    Hepatitis    Hep B and Hep C (patient does not report this but these are listed in previous notes.)   HIV infection ([place])    Hypertension    NICM (nonischemic cardiomyopathy) ([place])    cardia CTA 8/13 negative for obstructive CAD   Presence of permanent cardiac pacemaker    VT (ventricular tachycardia)     Family History   Problem Relation Age of Onset   Lung cancer Mother    Colon cancer Other 23    Past Surgical History:  Procedure Laterality Date   BI-VENTRICULAR PACEMAKER INSERTION N/A 06/26/2013   Procedure: BI-VENTRICULAR PACEMAKER INSERTION (CRT-P);  Surgeon: Evans Lance, MD;  Location[facility] C[facility] LAB;  Service: Cardiovascular;  Laterality: N/A;   BIV ICD GENERATOR CHANGEOUT N/A 01/24/2021   Procedure: BIV ICD GENERATOR CHANGEOUT;  Surgeon: Evans Lance, MD;  Location: [facility] CV LAB;  Service: Cardiovascular;  Laterality: N/A;   COLONOSCOPY WITH PROPOFOL N/A 08/14/2016   Procedure: COLONOSCOPY WITH PROPOFOL;  Surgeon: Carol Ada, MD;  Location: [facility] ENDOSCOPY;  Service: Endoscopy;  Laterality: N/A;   ELECTROPHYSIOLOGY STUDY N/A 02/19/2012   Procedure: ELECTROPHYSIOLOGY STUDY;  Surgeon: Evans Lance, MD;  Location: [facility] CATH LAB;  Service: Cardiovascular;  Laterality: N/A;   ESOPHAGOGASTRODUODENOSCOPY  12/07/2011   Procedure: ESOPHAGOGASTRODUODENOSCOPY (EGD);  Surgeon: Ladene Artist, MD,FACG;  Location: [facility] ENDOSCOPY;  Service: Endoscopy;  Laterality: N/A;   FINGER SURGERY     Thumb laceration.     HARDWARE REMOVAL Right 07/01/2018   Procedure: HARDWARE REMOVAL;  Surgeon: Renette Butters, MD;  Location: [place];  Service: Orthopedics;  Laterality: Right;   INCISION AND DRAINAGE Right 07/01/2018   Procedure: INCISION AND DRAINAGE;  Surgeon: Renette Butters, MD;  Location: [facility];  Service: Orthopedics;  Laterality: Right;   LEFT HEART CATHETERIZATION WITH CORONARY ANGIOGRAM N/A 01/31/2014   Procedure: LEFT HEART CATHETERIZATION WITH CORONARY ANGIOGRAM;  Surgeon: Wellington Hampshire, MD;  Location: [place] CATH LAB;  Service: Cardiovascular;  Laterality: N/A;   ORIF ANKLE FRACTURE Right 01/26/2017   Procedure: OPEN REDUCTION INTERNAL FIXATION (ORIF) ANKLE FRACTURE;  Surgeon: Renette Butters, MD;  Location: [place];  Service: Orthopedics;  Laterality: Right;   Social  History   Occupational History   Occupation: Unemployed  Tobacco Use   Smoking status: Never   Smokeless tobacco: Never  Vaping Use   Vaping Use: Never used  Substance and Sexual Activity   Alcohol use: Yes    Alcohol/week: 1.0 standard drink    Types: 1 Cans of beer per week    Comment: occ beer   Drug use: Not Currently    Frequency: 7.0 times per week    Types: Marijuana    Comment: 1x/week, smoked day before surgery   Sexual activity: Yes    Partners: Male    Comment: accepted condoms

## 2021-08-27 ENCOUNTER — Other Ambulatory Visit (HOSPITAL_COMMUNITY): Payer: Self-pay | Admitting: Internal Medicine

## 2021-08-29 NOTE — Progress Notes (Signed)
Patient ID: [name], male   DOB: April 21, 1966, 56 y.o.   MRN: [id number] ? ? ?[facility] ? ? ? ?PCP: Bess Kinds, MD ?Primary Cardiologist: Dr Ladona Ridgel  ?HF Cardiologist: Dr. [name]  ?  ?HPI: ?[name] is a 56 y.o.. male with HIV dx'd 1998, HCV, ETOH abuse and CHF due to NICM s/p Medtronic CRT-D (1/15). He also has a h/o syncope and previously had an EP study which was non-inducible for VT.  ?  ?Was admitted 8/14 for ADHF. Echo EF 20-25%.  ? ?Echo 10/16 EF 55%. ? ?Admitted 8/18 for syncope from low BP resulting in trimalleolar fracture of the right ankle and left proximal tibia. He underwent ORIF of right trimalleolar fracture. Echo EF 65-70%. ? ?Echo 10/21 EF 55-60%  ? ?Seen 3/22, stable NYHA II symptoms, Jardiance started.  ? ?Today he returns for HF follow up. Overall feeling fine. He is not SOB with activity. Walks for exercise.Notices occasional palpitations. Denies abnormal bleeding, CP, dizziness, edema, or PND/Orthopnea. Appetite ok. No fever or chills. Weight at home 184 pounds. Taking all medications. On disability. ? ?Cardiac Studies ?ECHO 10/21 EF 55-60% ?ECHO 8/18 EF 65-70%.  ?ECHO 12/17 EF 55% ?ECHO 10/16 EF 55% ?ECHO 5/15 EF 20-25%  ?ECHO 11/14: EF 20-25%, mod/severe MR, LA mod dilated ? ?SH: Lives with partner in [place], drinking a 2-3 12 oz beers daily. ? ?Review of systems complete and found to be negative unless listed in HPI.   ? ?Family History  ?Problem Relation Age of Onset  ? Lung cancer Mother   ? Colon cancer Other 50  ? ?Past Medical History:  ?Diagnosis Date  ? Chronic systolic heart failure (HCC)   ? a. Echo 12/05/11:  EF 20-25%, diff HK with mild sparing of IL wall, mild AI, mod MR, mild LAE, mild RVE, mild reduced RVF.;  b.  Echo 05/11/2012:  Mild LVH, EF 20-25%, Gr 1 diast dysfn, mod MR, mild LAE  ? Depression   ? G6PD deficiency   ? GERD (gastroesophageal reflux disease)   ? Hepatitis   ? Hep B and Hep C (patient does not report this but these  are listed in previous notes.)  ? HIV infection (HCC)   ? Hypertension   ? NICM (nonischemic cardiomyopathy) (HCC)   ? cardia CTA 8/13 negative for obstructive CAD  ? Presence of permanent cardiac pacemaker   ? VT (ventricular tachycardia) (H[facility])   ? ?Current Outpatient Medications  ?Medication Sig Dispense Refill  ? allopurinol (ZYLOPRIM) 100 MG tablet TAKE 1 TABLET(100 MG) BY MOUTH TWICE DAILY 60 tablet 3  ? amiodarone (PACERONE) 200 MG tablet Take 0.5 tablets (100 mg total) by mouth daily. 45 tablet 3  ? carvedilol (COREG) 25 MG tablet Take 1 tablet (25 mg total) by mouth 2 (two) times daily with a meal. NEEDS FOLLOW UP FOR ANYMORE REFILLS 60 tablet 0  ? cholecalciferol (VITAMIN D) 25 MCG (1000 UNIT) tablet Take 1,000 Units by mouth daily.    ? clobetasol ointment (TEMOVATE) 0.05 % Apply 1 application topically 2 (two) times daily.  0  ? colchicine 0.6 MG tablet TAKE 1 TABLET BY MOUTH TWICE DAILY FOR GOUT FLARE UPS 180 tablet 0  ? diclofenac sodium (VOLTAREN) 1 % GEL Apply 2 g topically 4 (four) times daily. 100 g 11  ? dolutegravir (TIVICAY) 50 MG tablet TAKE 1 TABLET BY MOUTH DAILY 30 tablet 5  ? empagliflozin (JARDIANCE) 10 MG TABS tablet Take 1 tablet (10 mg  total) by mouth daily. NEEDS FOLLOW UP FOR ANYMORE REFILLS 30 tablet 0  ? ENTRESTO 97-103 MG TAKE 1 TABLET BY MOUTH TWICE DAILY 60 tablet 11  ? fluticasone (FLONASE) 50 MCG/ACT nasal spray SHAKE LIQUID AND USE 2 SPRAYS IN EACH NOSTRIL DAILY 16 g 6  ? furosemide (LASIX) 40 MG tablet Take 40 mg by mouth daily as needed for fluid or edema.    ? gabapentin (NEURONTIN) 300 MG capsule Take 300 mg by mouth 3 (three) times daily.    ? hydrALAZINE (APRESOLINE) 50 MG tablet Take 50 mg by mouth 2 (two) times daily.    ? Multiple Vitamins-Minerals (CENTRUM SILVER 50+MEN PO) Take 1 tablet by mouth daily.    ? ODEFSEY 200-25-25 MG TABS tablet TAKE 1 TABLET BY MOUTH DAILY 30 tablet 5  ? Omega-3 Fatty Acids (FISH OIL) 1000 MG CPDR Take 1,000 mg by mouth daily.    ?  ondansetron (ZOFRAN) 4 MG tablet Take 4 mg by mouth every 8 (eight) hours as needed for nausea or vomiting.  0  ? rosuvastatin (CRESTOR) 5 MG tablet Take 1 tablet (5 mg total) by mouth daily. 30 tablet 11  ? spironolactone (ALDACTONE) 25 MG tablet TAKE 1 TABLET(25 MG) BY MOUTH AT BEDTIME 30 tablet 11  ? traZODone (DESYREL) 50 MG tablet TAKE 1 TABLET(50 MG) BY MOUTH AT BEDTIME 30 tablet 2  ? triamcinolone cream (KENALOG) 0.1 % Apply 1 application topically 3 (three) times daily.  0  ? MEDROL 4 MG TBPK tablet Take 4 mg by mouth See admin instructions. 6,5,4,3,2,1 (Patient not taking: Reported on 09/01/2021)    ? ?No current facility-administered medications for this encounter.  ? ?BP 120/76   Pulse 70   Wt 83.5 kg (184 lb)   SpO2 96%   BMI 31.10 kg/m?  ? ?Wt Readings from Last 3 Encounters:  ?09/01/21 83.5 kg (184 lb)  ?07/21/21 84.1 kg (185 lb 6.4 oz)  ?07/03/21 83.6 kg (184 lb 6.4 oz)  ? ?PHYSICAL EXAM: ?General:  NAD. No resp difficulty ?HEENT: Normal ?Neck: Supple. No JVD. Carotids 2+ bilat; no bruits. No lymphadenopathy or thryomegaly appreciated. ?Cor: PMI nondisplaced. Regular rate & rhythm. No rubs, gallops or murmurs. ?Lungs: Clear ?Abdomen: Obese, nontender, nondistended. No hepatosplenomegaly. No bruits or masses. Good bowel sounds. ?Extremities: No cyanosis, clubbing, rash, edema ?Neuro: Alert & oriented x 3, cranial nerves grossly intact. Moves all 4 extremities w/o difficulty. Affect pleasant. ? ?Device interrogation (personally reviewed): OptiVol down, thoracic impedence stable, no AF, no VT, 4 hrs daily/activity. ? ?ASSESSMENT & PLAN: ?1) Chronic systolic HF; now with recovered EF: Nonischemic cardiomyopathy, ?due to ETOH abuse. EF 20-25% in 2014. EF 55% (03/2015) s/p Medtronic CRT-D (06/2013).  ?- Echo (8/18): EF 65-70%.  ?- Echo (10/21): EF 55-60%  ?- NYHA I-II. Volume looks good today.  ?- Continue Entresto 97/103 mg bid. ?- Continue Coreg 25 mg bid.   ?- Continue spiro 25 mg daily.  ?- Continue  hydralazine 50 mg tid. ?- Continue Jardiance 10 mg daily. ?- Labs today. ?- Repeat echo next visit. ? ?2) HTN ?- Blood pressure well controlled.  ?- Continue current regimen. ? ?3) Ventricular tachycardia  ?- Has ICD, followed Dr. Ladona Ridgel  ?- No VT on interrogation.  ?- Continue amio 100 mg daily.  ?- Realizes need for eye doctor f/u. ?- Check LFTs and TSH. ? ?4) HIV ?- Stable.  ?- Followed by Dr Luciana Axe.  ? ?5) ETOH abuse  ?- Continues to drink 2-3 beers/day. ?-  Encouraged complete cessation. ? ?6) HLD ?- PCP started statin. ?- He is requesting lipids checked today. ? ?Follow up in 5-6 months with Dr. [name] + echo. ? ?Jacklynn Ganong, FNP  ?09/01/2021 ?2:54 PM  ? ?

## 2021-09-01 ENCOUNTER — Ambulatory Visit (HOSPITAL_COMMUNITY)
Admission: RE | Admit: 2021-09-01 | Discharge: 2021-09-01 | Disposition: A | Discharge status: Home / Self Care | Payer: Medicare Other | Source: Ambulatory Visit | Attending: Family Medicine | Admitting: Family Medicine

## 2021-09-01 ENCOUNTER — Encounter (HOSPITAL_COMMUNITY): Payer: Self-pay

## 2021-09-01 VITALS — BP 120/76 | HR 70 | Wt 184.0 lb

## 2021-09-01 DIAGNOSIS — E785 Hyperlipidemia, unspecified: Secondary | ICD-10-CM

## 2021-09-01 DIAGNOSIS — I110 Hypertensive heart disease with heart failure: Secondary | ICD-10-CM | POA: Diagnosis not present

## 2021-09-01 DIAGNOSIS — B1920 Unspecified viral hepatitis C without hepatic coma: Secondary | ICD-10-CM | POA: Diagnosis not present

## 2021-09-01 DIAGNOSIS — E782 Mixed hyperlipidemia: Secondary | ICD-10-CM | POA: Diagnosis not present

## 2021-09-01 DIAGNOSIS — I472 Ventricular tachycardia, unspecified: Secondary | ICD-10-CM

## 2021-09-01 DIAGNOSIS — Z79899 Other long term (current) drug therapy: Secondary | ICD-10-CM | POA: Diagnosis not present

## 2021-09-01 DIAGNOSIS — I428 Other cardiomyopathies: Secondary | ICD-10-CM | POA: Diagnosis not present

## 2021-09-01 DIAGNOSIS — I5022 Chronic systolic (congestive) heart failure: Secondary | ICD-10-CM | POA: Diagnosis not present

## 2021-09-01 DIAGNOSIS — I10 Essential (primary) hypertension: Secondary | ICD-10-CM | POA: Diagnosis not present

## 2021-09-01 DIAGNOSIS — Z21 Asymptomatic human immunodeficiency virus [HIV] infection status: Secondary | ICD-10-CM

## 2021-09-01 DIAGNOSIS — F1010 Alcohol abuse, uncomplicated: Secondary | ICD-10-CM | POA: Diagnosis not present

## 2021-09-01 DIAGNOSIS — Z95810 Presence of automatic (implantable) cardiac defibrillator: Secondary | ICD-10-CM | POA: Insufficient documentation

## 2021-09-01 DIAGNOSIS — Z8781 Personal history of (healed) traumatic fracture: Secondary | ICD-10-CM | POA: Insufficient documentation

## 2021-09-01 DIAGNOSIS — Z7984 Long term (current) use of oral hypoglycemic drugs: Secondary | ICD-10-CM | POA: Insufficient documentation

## 2021-09-01 DIAGNOSIS — B20 Human immunodeficiency virus [HIV] disease: Secondary | ICD-10-CM

## 2021-09-01 DIAGNOSIS — R002 Palpitations: Secondary | ICD-10-CM | POA: Insufficient documentation

## 2021-09-01 LAB — BASIC METABOLIC PANEL
Anion gap: 9 (ref 5–15)
BUN: 18 mg/dL (ref 6–20)
CO2: 20 mmol/L — ABNORMAL LOW (ref 22–32)
Calcium: 9.6 mg/dL (ref 8.9–10.3)
Chloride: 110 mmol/L (ref 98–111)
Creatinine, Ser: 1.2 mg/dL (ref 0.61–1.24)
GFR, Estimated: >60 mL/min (ref >60)
Glucose, Bld: 102 mg/dL — ABNORMAL HIGH (ref 70–99)
Potassium: 3.5 mmol/L (ref 3.5–5.1)
Sodium: 139 mmol/L (ref 135–145)

## 2021-09-01 LAB — LIPID PANEL
Cholesterol: 165 mg/dL (ref 0–200)
HDL: 68 mg/dL (ref >40)
LDL Cholesterol: 58 mg/dL (ref 0–99)
Total CHOL/HDL Ratio: 2.4 RATIO
Triglycerides: 195 mg/dL — ABNORMAL HIGH (ref <150)
VLDL: 39 mg/dL (ref 0–40)

## 2021-09-01 LAB — TSH: TSH: 1.3 u[IU]/mL (ref 0.350–4.500)

## 2021-09-01 NOTE — Patient Instructions (Signed)
There has been no changes to your medications. ? ?Labs done today, your results will be available in MyChart, we will contact you for abnormal readings. ? ?Your physician has requested that you have an echocardiogram. Echocardiography is a painless test that uses sound waves to create images of your heart. It provides your doctor with information about the size and shape of your heart and how well your heart?s chambers and valves are working. This procedure takes approximately one hour. There are no restrictions for this procedure. ? ?Your physician recommends that you schedule a follow-up appointment in: 6 months witch echocardiogram (October 2023)  **Please call the office in August to arrange your follow up appointment ** ? ?If you have any questions or concerns before your next appointment please send [place] a message through [place] or call our office at [phone].   ? ?TO LEAVE A MESSAGE FOR THE NURSE SELECT OPTION 2, PLEASE LEAVE A MESSAGE INCLUDING: ?YOUR NAME ?DATE OF BIRTH ?CALL BACK NUMBER ?REASON FOR CALL**this is important as we prioritize the call backs ? ?YOU WILL RECEIVE A CALL BACK THE SAME DAY AS LONG AS YOU CALL BEFORE 4:00 PM ? ?At the [facility], you and your health needs are our priority. As part of our continuing mission to provide you with exceptional heart care, we have created designated Provider Care Teams. These Care Teams include your primary Cardiologist (physician) and Advanced Practice Providers (APPs- Physician Assistants and Nurse Practitioners) who all work together to provide you with the care you need, when you need it.  ? ?You may see any of the following providers on your designated Care Team at your next follow up: ?Dr [name] ?Dr Marca Ancona ?Tonye Becket, NP ?Robbie Lis, PA ?Jessica Milford,NP ?Anna Genre, PA ?Karle Plumber, PharmD ? ? ?Please be sure to bring in all your medications bottles to every appointment.  ? ? ?

## 2021-09-03 ENCOUNTER — Other Ambulatory Visit (HOSPITAL_COMMUNITY): Payer: Self-pay | Admitting: *Deleted

## 2021-09-03 MED ORDER — HYDRALAZINE HCL 50 MG PO TABS
50.0000 mg | ORAL_TABLET | Freq: Two times a day (BID) | ORAL | 3 refills | Status: DC
Start: 2021-09-03 — End: 2021-12-16

## 2021-09-03 MED ORDER — ENTRESTO 97-103 MG PO TABS: 1.0000 | ORAL_TABLET | Freq: Two times a day (BID) | ORAL | 3 refills | Status: DC

## 2021-09-03 MED ORDER — SPIRONOLACTONE 25 MG PO TABS: 25.0000 mg | ORAL_TABLET | Freq: Every day | ORAL | 3 refills | Status: DC

## 2021-09-03 MED ORDER — CARVEDILOL 25 MG PO TABS: 25.0000 mg | ORAL_TABLET | Freq: Two times a day (BID) | ORAL | 3 refills | Status: DC

## 2021-09-03 MED ORDER — ALLOPURINOL 100 MG PO TABS: ORAL_TABLET | ORAL | 3 refills | Status: DC

## 2021-09-03 MED ORDER — EMPAGLIFLOZIN 10 MG PO TABS: 10.0000 mg | ORAL_TABLET | Freq: Every day | ORAL | 3 refills | Status: DC

## 2021-09-03 MED ORDER — FUROSEMIDE 40 MG PO TABS: 40.0000 mg | ORAL_TABLET | Freq: Every day | ORAL | 3 refills | Status: DC | PRN

## 2021-09-03 MED ORDER — AMIODARONE HCL 200 MG PO TABS: 100.0000 mg | ORAL_TABLET | Freq: Every day | ORAL | 3 refills | Status: DC

## 2021-09-04 ENCOUNTER — Ambulatory Visit (INDEPENDENT_AMBULATORY_CARE_PROVIDER_SITE_OTHER): Payer: Medicare Other | Admitting: Orthopedic Surgery

## 2021-09-04 DIAGNOSIS — M10071 Idiopathic gout, right ankle and foot: Secondary | ICD-10-CM

## 2021-09-04 DIAGNOSIS — M722 Plantar fascial fibromatosis: Secondary | ICD-10-CM | POA: Diagnosis not present

## 2021-09-05 ENCOUNTER — Encounter: Payer: Self-pay | Admitting: Orthopedic Surgery

## 2021-09-05 NOTE — Progress Notes (Signed)
? ?Office Visit Note ?  ?Patient: [name]           ?Date of Birth: 11/27/1965           ?MRN: [id number] ?Visit Date: 09/04/2021 ?             ?Requested by: Holley Bouche, MD ?[address] ?[place],  [place] [address] ?PCP: Holley Bouche, MD ? ?Chief Complaint  ?Patient presents with  ? Left Foot - Follow-up  ? Right Knee - Follow-up  ? ? ? ? ?HPI: ?Patient is a 56 year old gentleman who presents in follow-up for multiple issues.  He is status post right knee injection last month.  He has had some arthritic pain in the left great toe MTP joint from his gout.  He states that his gout has been well controlled lately and he does not feel like blood work needs to be checked today.  He denies any symptoms.  He is on the allopurinol 100 mg twice a day and colchicine as needed.  Patient states he has pain on the plantar aspect of the right heel. ? ?Assessment & Plan: ?Visit Diagnoses:  ?1. Plantar fasciitis, right   ?2. Acute idiopathic gout of right ankle   ? ? ?Plan: Patient was given instructions for dorsiflexion stretching this was demonstrated.  Also recommended Voltaren gel. ? ?Follow-Up Instructions: Return if symptoms worsen or fail to improve.  ? ?Ortho Exam ? ?Patient is alert, oriented, no adenopathy, well-dressed, normal affect, normal respiratory effort. ?Examination patient does have Achilles contracture with dorsiflexion only to neutral he is tender to palpation at the origin of the plantar fascia.  He has no signs or symptoms of acute gout there is no redness of the joints no swelling. ? ?Imaging: ?No results found. ?No images are attached to the encounter. ? ?Labs: ?Lab Results  ?Component Value Date  ? HGBA1C 4.4 (L) 01/26/2017  ? HGBA1C 5.8 (H) 07/27/2012  ? ESRSEDRATE 8 06/10/2010  ? LABURIC 7.0 03/27/2021  ? REPTSTATUS 03/31/2021 FINAL 03/26/2021  ? GRAMSTAIN  07/01/2018  ?  FEW WBC PRESENT,BOTH PMN AND MONONUCLEAR ?NO ORGANISMS SEEN ?  ? CULT  03/26/2021  ?  NO GROWTH 5 DAYS ?Performed at  [facility], [place] [address], [place], [place] [address] ?  ? ? ? ?Lab Results  ?Component Value Date  ? ALBUMIN 5.0 (H) 07/03/2021  ? ALBUMIN 4.4 03/26/2021  ? ALBUMIN 4.1 03/13/2020  ? ? ?Lab Results  ?Component Value Date  ? MG 2.2 03/29/2021  ? MG 2.0 01/26/2017  ? MG 2.1 03/23/2014  ? ?Lab Results  ?Component Value Date  ? VD25OH 22.6 (L) 01/26/2017  ? ? ?No results found for: PREALBUMIN ? ?  Latest Ref Rng & Units 03/28/2021  ?  1:21 AM 03/26/2021  ?  6:22 PM 01/20/2021  ?  1:33 PM  ?CBC EXTENDED  ?WBC 4.0 - 10.5 K/uL 8.8   8.3   7.6    ?RBC 4.22 - 5.81 MIL/uL 4.29   4.78   4.53    ?Hemoglobin 13.0 - 17.0 g/dL 12.8   14.2   13.9    ?HCT 39.0 - 52.0 % 39.7   43.7   40.7    ?Platelets 150 - 400 K/uL 242   265   219    ?NEUT# 1.7 - 7.7 K/uL  4.7   5.0    ?Lymph# 0.7 - 4.0 K/uL  2.8   2.0    ? ? ? ?  There is no height or weight on file to calculate BMI. ? ?Orders:  ?No orders of the defined types were placed in this encounter. ? ?No orders of the defined types were placed in this encounter. ? ? ? Procedures: ?No procedures performed ? ?Clinical Data: ?No additional findings. ? ?ROS: ? ?All other systems negative, except as noted in the HPI. ?Review of Systems ? ?Objective: ?Vital Signs: There were no vitals taken for this visit. ? ?Specialty Comments:  ?No specialty comments available. ? ?PMFS History: ?Patient Active Problem List  ? Diagnosis Date Noted  ? Encounter for screening examination for sexually transmitted disease 05/05/2021  ? Traumatic arthritis of right ankle   ? Acute idiopathic gout involving toe of right foot   ? Septic arthritis of right ankle ([place]) 03/26/2021  ? Mixed hyperlipidemia 11/26/2019  ? Cirrhosis ([place]) 05/15/2019  ? Neurodermatitis 09/30/2017  ? Nonischemic cardiomyopathy ([place])   ? HIV (human immunodeficiency virus infection) ([place]) 01/25/2017  ? Insomnia 06/26/2014  ? PTSD (post-traumatic stress disorder) 02/03/2014  ? ICD (implantable cardioverter-defibrillator), biventricular, in  situ 01/23/2014  ? Paroxysmal ventricular fibrillation ([place]) 01/19/2014  ? Protein-calorie malnutrition, severe ([place]) 02/13/2013  ? GERD (gastroesophageal reflux disease) 08/19/2012  ? Chronic systolic heart failure ([place]) 01/25/2012  ? Alcohol abuse 11/30/2011  ? Genital herpes 06/09/2006  ? Chronic hepatitis C without hepatic coma ([place]) 06/09/2006  ? Primary hypertension 06/09/2006  ? ?Past Medical History:  ?Diagnosis Date  ? Chronic systolic heart failure ([place])   ? a. Echo 12/05/11:  EF 20-25%, diff HK with mild sparing of IL wall, mild AI, mod MR, mild LAE, mild RVE, mild reduced RVF.;  b.  Echo 05/11/2012:  Mild LVH, EF 20-25%, Gr 1 diast dysfn, mod MR, mild LAE  ? Depression   ? G6PD deficiency   ? GERD (gastroesophageal reflux disease)   ? Hepatitis   ? Hep B and Hep C (patient does not report this but these are listed in previous notes.)  ? HIV infection ([place])   ? Hypertension   ? NICM (nonischemic cardiomyopathy) ([place])   ? cardia CTA 8/13 negative for obstructive CAD  ? Presence of permanent cardiac pacemaker   ? VT (ventricular tachycardia) ([place])   ?  ?Family History  ?Problem Relation Age of Onset  ? Lung cancer Mother   ? Colon cancer Other 67  ?  ?Past Surgical History:  ?Procedure Laterality Date  ? BI-VENTRICULAR PACEMAKER INSERTION N/A 06/26/2013  ? Procedure: BI-VENTRICULAR PACEMAKER INSERTION (CRT-P);  Surgeon: Evans Lance, MD;  Location: [facility] CATH LAB;  Service: Cardiovascular;  Laterality: N/A;  ? BIV ICD GENERATOR CHANGEOUT N/A 01/24/2021  ? Procedure: BIV ICD GENERATOR CHANGEOUT;  Surgeon: Evans Lance, MD;  Location: [facility] CV LAB;  Service: Cardiovascular;  Laterality: N/A;  ? COLONOSCOPY WITH PROPOFOL N/A 08/14/2016  ? Procedure: COLONOSCOPY WITH PROPOFOL;  Surgeon: Carol Ada, MD;  Location: [facility] ENDOSCOPY;  Service: Endoscopy;  Laterality: N/A;  ? ELECTROPHYSIOLOGY STUDY N/A 02/19/2012  ? Procedure: ELECTROPHYSIOLOGY STUDY;  Surgeon: Evans Lance, MD;  Location: [facility] CATH LAB;  Service:  Cardiovascular;  Laterality: N/A;  ? ESOPHAGOGASTRODUODENOSCOPY  12/07/2011  ? Procedure: ESOPHAGOGASTRODUODENOSCOPY (EGD);  Surgeon: Ladene Artist, MD,FACG;  Location: [facility] ENDOSCOPY;  Service: Endoscopy;  Laterality: N/A;  ? FINGER SURGERY    ? Thumb laceration.    ? HARDWARE REMOVAL Right 07/01/2018  ? Procedure: HARDWARE REMOVAL;  Surgeon: Renette Butters, MD;  Location: [facility];  Service: Orthopedics;  Laterality: Right;  ? INCISION AND DRAINAGE Right 07/01/2018  ? Procedure: INCISION AND DRAINAGE;  Surgeon: Renette Butters, MD;  Location: [facility];  Service: Orthopedics;  Laterality: Right;  ? LEFT HEART CATHETERIZATION WITH CORONARY ANGIOGRAM N/A 01/31/2014  ? Procedure: LEFT HEART CATHETERIZATION WITH CORONARY ANGIOGRAM;  Surgeon: Wellington Hampshire, MD;  Location: [facility] CATH LAB;  Service: Cardiovascular;  Laterality: N/A;  ? ORIF ANKLE FRACTURE Right 01/26/2017  ? Procedure: OPEN REDUCTION INTERNAL FIXATION (ORIF) ANKLE FRACTURE;  Surgeon: Renette Butters, MD;  Location: [facility];  Service: Orthopedics;  Laterality: Right;  ? ?Social History  ? ?Occupational History  ? Occupation: Unemployed  ?Tobacco Use  ? Smoking status: Never  ? Smokeless tobacco: Never  ?Vaping Use  ? Vaping Use: Never used  ?Substance and Sexual Activity  ? Alcohol use: Yes  ?  Alcohol/week: 1.0 standard drink  ?  Types: 1 Cans of beer per week  ?  Comment: occ beer  ? Drug use: Not Currently  ?  Frequency: 7.0 times per week  ?  Types: Marijuana  ?  Comment: 1x/week, smoked day before surgery  ? Sexual activity: Yes  ?  Partners: Male  ?  Comment: accepted condoms  ? ? ? ? ? ?

## 2021-09-12 ENCOUNTER — Other Ambulatory Visit: Payer: Self-pay | Admitting: Student

## 2021-09-12 NOTE — Progress Notes (Deleted)
SUBJECTIVE:   CHIEF COMPLAINT / HPI:   Concern for Cirrhosis Alcohol use hx: Weight hx: Hep C:in 2018-2019, cured Hep B: Exposed  -Symptoms: Ascites Fatigue Muscle cramps Easy bruising Jaundice Leg swelling Palmer erythema, finger clubbing Caput medusae Gynecomastia Spider angioma  -Lab test: INR HFP  -Vaccinations: Hep A Encourage Yearly Flu  PERTINENT  PMH / PSH: HIV, Hx Hep C  Past Medical History:  Diagnosis Date   Chronic systolic heart failure (HCC)    a. Echo 12/05/11:  EF 20-25%, diff HK with mild sparing of IL wall, mild AI, mod MR, mild LAE, mild RVE, mild reduced RVF.;  b.  Echo 05/11/2012:  Mild LVH, EF 20-25%, Gr 1 diast dysfn, mod MR, mild LAE   Depression    G6PD deficiency    GERD (gastroesophageal reflux disease)    Hepatitis    Hep B and Hep C (patient does not report this but these are listed in previous notes.)   HIV infection (HCC)    Hypertension    NICM (nonischemic cardiomyopathy) (HCC)    cardia CTA 8/13 negative for obstructive CAD   Presence of permanent cardiac pacemaker    VT (ventricular tachycardia) (HCC)     Past Surgical History:  Procedure Laterality Date   BI-VENTRICULAR PACEMAKER INSERTION N/A 06/26/2013   Procedure: BI-VENTRICULAR PACEMAKER INSERTION (CRT-P);  Surgeon: Marinus Maw, MD;  Location: [facility] CATH LAB;  Service: Cardiovascular;  Laterality: N/A;   BIV ICD GENERATOR CHANGEOUT N/A 01/24/2021   Procedure: BIV ICD GENERATOR CHANGEOUT;  Surgeon: Marinus Maw, MD;  Location: [facility] INVASIVE CV LAB;  Service: Cardiovascular;  Laterality: N/A;   COLONOSCOPY WITH PROPOFOL N/A 08/14/2016   Procedure: COLONOSCOPY WITH PROPOFOL;  Surgeon: Jeani Hawking, MD;  Location: [facility] ENDOSCOPY;  Service: Endoscopy;  Laterality: N/A;   ELECTROPHYSIOLOGY STUDY N/A 02/19/2012   Procedure: ELECTROPHYSIOLOGY STUDY;  Surgeon: Marinus Maw, MD;  Location: [facility] CATH LAB;  Service: Cardiovascular;  Laterality: N/A;   ESOPHAGOGASTRODUODENOSCOPY   12/07/2011   Procedure: ESOPHAGOGASTRODUODENOSCOPY (EGD);  Surgeon: [name], MD,FACG;  Location: [facility] ENDOSCOPY;  Service: Endoscopy;  Laterality: N/A;   FINGER SURGERY     Thumb laceration.     HARDWARE REMOVAL Right 07/01/2018   Procedure: HARDWARE REMOVAL;  Surgeon: Sheral Apley, MD;  Location: [facility];  Service: Orthopedics;  Laterality: Right;   INCISION AND DRAINAGE Right 07/01/2018   Procedure: INCISION AND DRAINAGE;  Surgeon: Sheral Apley, MD;  Location:  SURGERY CENTER;  Service: Orthopedics;  Laterality: Right;   LEFT HEART CATHETERIZATION WITH CORONARY ANGIOGRAM N/A 01/31/2014   Procedure: LEFT HEART CATHETERIZATION WITH CORONARY ANGIOGRAM;  Surgeon: Iran Ouch, MD;  Location: [facility] CATH LAB;  Service: Cardiovascular;  Laterality: N/A;   ORIF ANKLE FRACTURE Right 01/26/2017   Procedure: OPEN REDUCTION INTERNAL FIXATION (ORIF) ANKLE FRACTURE;  Surgeon: Sheral Apley, MD;  Location: [facility] OR;  Service: Orthopedics;  Laterality: Right;    OBJECTIVE:  There were no vitals taken for this visit.  General: NAD, pleasant, able to participate in exam Cardiac: RRR, no murmurs auscultated. Respiratory: CTAB, normal effort, no wheezes, rales or rhonchi Abdomen: soft, non-tender, non-distended, normoactive bowel sounds Extremities: warm and well perfused, no edema or cyanosis. Skin: warm and dry, no rashes noted Neuro: alert, no obvious focal deficits, speech normal Psych: Normal affect and mood  ASSESSMENT/PLAN:  No problem-specific Assessment & Plan notes found for this encounter.   No orders of the defined types were placed in  this encounter.  No orders of the defined types were placed in this encounter.  No follow-ups on file. @SIGNNOTE @ {    This will disappear when note is signed, click to select method of visit    :1}

## 2021-09-12 NOTE — Patient Instructions (Incomplete)
It was great to see you! Thank you for allowing me to participate in your care!  I recommend that you always bring your medications to each appointment as this makes it easy to ensure we are on the correct medications and helps us not miss when refills are needed.  Our plans for today:  - *** -   We are checking some labs today, I will call you if they are abnormal will send you a MyChart message or a letter if they are normal.  If you do not hear about your labs in the next 2 weeks please let us know.***  Take care and seek immediate care sooner if you develop any concerns.   Dr. Kristee Angus, MD [facility]  

## 2021-09-16 ENCOUNTER — Other Ambulatory Visit: Payer: Self-pay | Admitting: Internal Medicine

## 2021-09-25 ENCOUNTER — Ambulatory Visit (INDEPENDENT_AMBULATORY_CARE_PROVIDER_SITE_OTHER): Payer: Medicare Other | Admitting: Student

## 2021-09-25 ENCOUNTER — Ambulatory Visit: Payer: Medicare Other | Admitting: Student

## 2021-09-25 ENCOUNTER — Encounter: Payer: Self-pay | Admitting: Student

## 2021-09-25 VITALS — BP 96/82 | HR 86 | Ht 64.5 in | Wt 181.4 lb

## 2021-09-25 DIAGNOSIS — K7460 Unspecified cirrhosis of liver: Secondary | ICD-10-CM

## 2021-09-25 NOTE — Patient Instructions (Addendum)
It was great to see you! Thank you for allowing me to participate in your care!  ? ?I recommend that you always bring your medications to each appointment as this makes it easy to ensure we are on the correct medications and helps [place] not miss when refills are needed. ? ?Our plans for today:  ?- I have referred you to hepatology (liver doctor)  ?- Your last liver ultrasound did show that you have cirrhosis  b ? ?Take care and seek immediate care sooner if you develop any concerns. Please remember to show up 15 minutes before your scheduled appointment time! ? ?Levin Erp, MD ?[facility]  ?

## 2021-09-25 NOTE — Progress Notes (Signed)
    SUBJECTIVE:   CHIEF COMPLAINT / HPI: Questions about cirrhosis   Cirrhosis RUQ u/s last year had shown cirrhotic appearance of the liver without focal mass identified. Patient has history of ETOH use (currently drinking around 3 beers a day and not interested in cutting back or stopping) and Hep C which seems to have cleared s/p treatment. He follows with an ID doctor and is on [place]. He is also on amiodarone d/t hx of VT and followed closely for systolic heart failure. He has had cirrhosis documented in chart as well but is unsure of if he has it.  PERTINENT  PMH / PSH: HIV, chronic systolic HF  OBJECTIVE:   BP 96/82   Pulse 86   Ht 5' 4.5" (1.638 m)   Wt 181 lb 6.4 oz (82.3 kg)   SpO2 98%   BMI 30.66 kg/m   General: NAD, awake, alert, responsive to questions Head: Normocephalic atraumatic, no scleral icterus CV: Regular rate and rhythm no murmurs rubs or gallops, ICD in place Respiratory: Clear to ausculation bilaterally, no wheezes rales or crackles, chest rises symmetrically,  no increased work of breathing Abdomen: Soft, non-tender, non-distended, normoactive bowel sounds, Murphy's sign negative Extremities: Moves upper and lower extremities freely, no edema in LE Neuro: No focal deficits Skin: No rashes or lesions visualized   ASSESSMENT/PLAN:   Cirrhosis ([place]) Patient has documented cirrhosis but was unsure of diagnosis and meaning. Discussed with him in great detail what cirrhosis was and things that can cause this. Also discussed keeping a close on on his liver is important to prevent hepatocellular carcinoma emergence. Recent labs from February overall normal although LFTs less reliable in cirrhosis. Albumin was 5.0. Chart reviewed and asked patient if he had ever gone to a hepatologist, which he has not. Referred him to hepatology for closer follow up. -Encouraged alcohol cessation -Patient handout provided -Currently on amiodarone per cardiology  followed closely, no medications adjusted today  Gerrit Heck, MD [place]

## 2021-09-26 ENCOUNTER — Other Ambulatory Visit (HOSPITAL_COMMUNITY): Payer: Self-pay | Admitting: Internal Medicine

## 2021-09-26 NOTE — Assessment & Plan Note (Addendum)
Patient has documented cirrhosis but was unsure of diagnosis and meaning. Discussed with him in great detail what cirrhosis was and things that can cause this. Also discussed keeping a close on on his liver is important to prevent hepatocellular carcinoma emergence. Recent labs from February overall normal although LFTs less reliable in cirrhosis. Albumin was 5.0. Chart reviewed and asked patient if he had ever gone to a hepatologist, which he has not. Referred him to hepatology for closer follow up. ?-Encouraged alcohol cessation ?-Patient handout provided ?-Currently on amiodarone per cardiology followed closely, no medications adjusted today ?

## 2021-10-13 ENCOUNTER — Other Ambulatory Visit: Payer: Self-pay | Admitting: Nurse Practitioner

## 2021-10-13 DIAGNOSIS — K7469 Other cirrhosis of liver: Secondary | ICD-10-CM

## 2021-10-16 ENCOUNTER — Ambulatory Visit
Admission: RE | Admit: 2021-10-16 | Discharge: 2021-10-16 | Disposition: A | Discharge status: Home / Self Care | Payer: Medicare Other | Source: Ambulatory Visit | Attending: Nurse Practitioner | Admitting: Nurse Practitioner

## 2021-10-16 DIAGNOSIS — K7469 Other cirrhosis of liver: Secondary | ICD-10-CM

## 2021-10-16 DIAGNOSIS — N281 Cyst of kidney, acquired: Secondary | ICD-10-CM | POA: Diagnosis not present

## 2021-10-16 DIAGNOSIS — K7460 Unspecified cirrhosis of liver: Secondary | ICD-10-CM | POA: Diagnosis not present

## 2021-10-16 DIAGNOSIS — K7689 Other specified diseases of liver: Secondary | ICD-10-CM | POA: Diagnosis not present

## 2021-10-16 DIAGNOSIS — K8020 Calculus of gallbladder without cholecystitis without obstruction: Secondary | ICD-10-CM | POA: Diagnosis not present

## 2021-10-20 ENCOUNTER — Other Ambulatory Visit: Payer: Self-pay | Admitting: Student

## 2021-10-21 ENCOUNTER — Telehealth: Payer: Self-pay | Admitting: Orthopedic Surgery

## 2021-10-21 NOTE — Telephone Encounter (Signed)
Pt calling and is wondering if he should start taking his prednisone today?

## 2021-10-21 NOTE — Telephone Encounter (Signed)
Only called in allopurinol

## 2021-10-21 NOTE — Telephone Encounter (Signed)
This is duplicate

## 2021-10-21 NOTE — Telephone Encounter (Signed)
Pt called requesting a call back from Aut[facility] F. Pt has medication questions about his gout flareup. Please call pt at [phone].

## 2021-10-21 NOTE — Telephone Encounter (Signed)
I SW pt, he is having a gout flare up. He has been taking his allopurinol and colchicine and no relief. He said he was given a prednisone pack from [facility] and never took it. Started it today on his own. I advised if no better from this he needs to call our office to make an appt to we can recheck his levels as we did not do this at his last appt here. He understood.

## 2021-10-28 ENCOUNTER — Other Ambulatory Visit: Payer: Self-pay | Admitting: Orthopedic Surgery

## 2021-10-28 ENCOUNTER — Ambulatory Visit (INDEPENDENT_AMBULATORY_CARE_PROVIDER_SITE_OTHER): Payer: Medicare Other

## 2021-10-28 DIAGNOSIS — I428 Other cardiomyopathies: Secondary | ICD-10-CM

## 2021-10-28 DIAGNOSIS — M10071 Idiopathic gout, right ankle and foot: Secondary | ICD-10-CM

## 2021-10-28 LAB — CUP PACEART REMOTE DEVICE CHECK
Battery Remaining Longevity: 77 mo
Battery Voltage: 3.01 V
Brady Statistic AP VP Percent: 0.17 %
Brady Statistic AP VS Percent: 0.02 %
Brady Statistic AS VP Percent: 98.44 %
Brady Statistic AS VS Percent: 1.38 %
Brady Statistic RA Percent Paced: 0.18 %
Brady Statistic RV Percent Paced: 1.57 %
Date Time Interrogation Session: 20230530072306
HighPow Impedance: 70 Ohm
Implantable Lead Implant Date: 20150126
Implantable Lead Implant Date: 20150126
Implantable Lead Implant Date: 20150126
Implantable Lead Location: 753858
Implantable Lead Location: 753859
Implantable Lead Location: 753860
Implantable Lead Model: 4396
Implantable Lead Model: 5076
Implantable Lead Model: 6935
Implantable Pulse Generator Implant Date: 20220826
Lead Channel Impedance Value: 304 Ohm
Lead Channel Impedance Value: 323 Ohm
Lead Channel Impedance Value: 323 Ohm
Lead Channel Impedance Value: 380 Ohm
Lead Channel Impedance Value: 570 Ohm
Lead Channel Impedance Value: 760 Ohm
Lead Channel Pacing Threshold Amplitude: 0.625 V
Lead Channel Pacing Threshold Amplitude: 0.875 V
Lead Channel Pacing Threshold Amplitude: 1.875 V
Lead Channel Pacing Threshold Pulse Width: 0.4 ms
Lead Channel Pacing Threshold Pulse Width: 0.4 ms
Lead Channel Pacing Threshold Pulse Width: 0.4 ms
Lead Channel Sensing Intrinsic Amplitude: 1 mV
Lead Channel Sensing Intrinsic Amplitude: 1 mV
Lead Channel Sensing Intrinsic Amplitude: 7.25 mV
Lead Channel Sensing Intrinsic Amplitude: 7.25 mV
Lead Channel Setting Pacing Amplitude: 1.5 V
Lead Channel Setting Pacing Amplitude: 2 V
Lead Channel Setting Pacing Amplitude: 3 V
Lead Channel Setting Pacing Pulse Width: 0.4 ms
Lead Channel Setting Pacing Pulse Width: 0.4 ms
Lead Channel Setting Sensing Sensitivity: 0.3 mV

## 2021-10-31 ENCOUNTER — Telehealth: Payer: Self-pay

## 2021-10-31 NOTE — Telephone Encounter (Signed)
Attempted to call Pt.  Current phone number does not connect.  Attempted to call significant other and sister to determine if Pt's phone number has changed.  Will continue to attempt to call Pt.

## 2021-10-31 NOTE — Telephone Encounter (Signed)
-----   Message from Marinus Maw, MD sent at 10/30/2021  8:39 PM EDT ----- Fluid is up. Can he be called to be sure he is ok. Might need more diuretic.

## 2021-11-04 ENCOUNTER — Other Ambulatory Visit: Payer: Self-pay

## 2021-11-04 ENCOUNTER — Encounter: Payer: Self-pay | Admitting: Internal Medicine

## 2021-11-04 ENCOUNTER — Encounter: Payer: Self-pay | Admitting: *Deleted

## 2021-11-04 ENCOUNTER — Other Ambulatory Visit (HOSPITAL_COMMUNITY)
Admission: RE | Admit: 2021-11-04 | Discharge: 2021-11-04 | Disposition: A | Discharge status: Home / Self Care | Payer: Medicare Other | Source: Ambulatory Visit | Attending: Internal Medicine | Admitting: Internal Medicine

## 2021-11-04 ENCOUNTER — Ambulatory Visit (INDEPENDENT_AMBULATORY_CARE_PROVIDER_SITE_OTHER): Payer: Medicare Other | Admitting: Internal Medicine

## 2021-11-04 VITALS — BP 127/89 | HR 67 | Temp 97.9°F | Wt 178.4 lb

## 2021-11-04 DIAGNOSIS — Z113 Encounter for screening for infections with a predominantly sexual mode of transmission: Secondary | ICD-10-CM

## 2021-11-04 DIAGNOSIS — K7460 Unspecified cirrhosis of liver: Secondary | ICD-10-CM

## 2021-11-04 DIAGNOSIS — B20 Human immunodeficiency virus [HIV] disease: Secondary | ICD-10-CM

## 2021-11-04 NOTE — Assessment & Plan Note (Signed)
He continues to do well on his salvage regimen with no concerns.  He will continue with the same with labs today and rtc in 6 months.

## 2021-11-04 NOTE — Progress Notes (Signed)
   Subjective:    Patient ID: [name], male    DOB: [birth year], 56 y.o.   MRN: [id number]  HPI Here for follow up of HIV He continues on Tivicay + Odefsey for his salvage regimen.  No missed doses, no new issues.  Had a visit with hepatology ([facility]) and ultrasound with no issues.  Has fibroscan scheduled for September.  He plans to continue to follow with them.     Review of Systems  Constitutional:  Negative for fatigue.  Gastrointestinal:  Negative for diarrhea and nausea.  Skin:  Negative for rash.      Objective:   Physical Exam Eyes:     General: No scleral icterus. Pulmonary:     Effort: Pulmonary effort is normal.  Neurological:     General: No focal deficit present.     Mental Status: He is alert.          Assessment & Plan:

## 2021-11-04 NOTE — Assessment & Plan Note (Signed)
Will screen today.  Low risk with no recent sexual activity.

## 2021-11-04 NOTE — Assessment & Plan Note (Signed)
Recent HCC screening and no new concerns.  Now followed by [facility] hepatology and appreciate their input, help.

## 2021-11-05 LAB — T-HELPER CELL (CD4) - (RCID CLINIC ONLY)
CD4 % Helper T Cell: 31 % — ABNORMAL LOW (ref 33–65)
CD4 T Cell Abs: 492 /uL (ref 400–1790)

## 2021-11-05 LAB — URINE CYTOLOGY ANCILLARY ONLY
Chlamydia: NEGATIVE
Comment: NEGATIVE
Comment: NORMAL
Neisseria Gonorrhea: NEGATIVE

## 2021-11-08 LAB — HIV-1 RNA QUANT-NO REFLEX-BLD
HIV 1 RNA Quant: NOT DETECTED Copies/mL
HIV-1 RNA Quant, Log: NOT DETECTED Log cps/mL

## 2021-11-08 LAB — RPR: RPR Ser Ql: NONREACTIVE

## 2021-11-11 ENCOUNTER — Other Ambulatory Visit: Payer: Self-pay | Admitting: Internal Medicine

## 2021-11-11 ENCOUNTER — Other Ambulatory Visit: Payer: Self-pay

## 2021-11-11 DIAGNOSIS — B20 Human immunodeficiency virus [HIV] disease: Secondary | ICD-10-CM

## 2021-11-11 MED ORDER — TIVICAY 50 MG PO TABS: ORAL_TABLET | ORAL | 5 refills | Status: DC

## 2021-11-11 MED ORDER — ODEFSEY 200-25-25 MG PO TABS: 1.0000 | ORAL_TABLET | Freq: Every day | ORAL | 5 refills | Status: DC

## 2021-11-11 NOTE — Telephone Encounter (Signed)
Outreach made to Pt.  Per Pt he states he did have some swelling in his feet recently, but this has resolved.  He denies any shortness of breath.  Reminded Pt to take his furosemide 40 mg PO daily PRN if he notices any fluid retention.  He indicates understanding and agreement.  Pt thanked nurse for call.

## 2021-11-13 NOTE — Progress Notes (Signed)
Remote ICD transmission.   

## 2021-11-26 ENCOUNTER — Telehealth: Payer: Self-pay | Admitting: Orthopedic Surgery

## 2021-11-26 DIAGNOSIS — M10071 Idiopathic gout, right ankle and foot: Secondary | ICD-10-CM

## 2021-11-26 MED ORDER — ALLOPURINOL 100 MG PO TABS: ORAL_TABLET | ORAL | 3 refills | Status: DC

## 2021-11-26 MED ORDER — COLCHICINE 0.6 MG PO TABS: 0.6000 mg | ORAL_TABLET | Freq: Every day | ORAL | 0 refills | Status: DC

## 2021-11-26 NOTE — Telephone Encounter (Signed)
Pt last in the office 08/2021 but uric acid last checked end of last year. Ok to refill? Please see below.

## 2021-11-26 NOTE — Telephone Encounter (Signed)
Patient called needing Rx refilled Allopurinol and Colchicine. Patient asked if he can get a 90 day supply for both Rx? The number to contact patient is [phone]

## 2021-12-11 DIAGNOSIS — I639 Cerebral infarction, unspecified: Secondary | ICD-10-CM

## 2021-12-11 HISTORY — DX: Cerebral infarction, unspecified: I63.9

## 2021-12-12 ENCOUNTER — Emergency Department (HOSPITAL_COMMUNITY): Payer: Medicare Other

## 2021-12-12 ENCOUNTER — Encounter (HOSPITAL_COMMUNITY): Payer: Self-pay

## 2021-12-12 ENCOUNTER — Encounter: Payer: Self-pay | Admitting: Internal Medicine

## 2021-12-12 ENCOUNTER — Observation Stay (HOSPITAL_COMMUNITY): Payer: Medicare Other

## 2021-12-12 ENCOUNTER — Telehealth: Payer: Self-pay

## 2021-12-12 ENCOUNTER — Observation Stay (HOSPITAL_COMMUNITY)
Admission: EM | Admit: 2021-12-12 | Discharge: 2021-12-16 | Disposition: A | Discharge status: Home Health | Payer: Medicare Other | Attending: Family Medicine | Admitting: Family Medicine

## 2021-12-12 ENCOUNTER — Other Ambulatory Visit: Payer: Self-pay

## 2021-12-12 DIAGNOSIS — R270 Ataxia, unspecified: Secondary | ICD-10-CM | POA: Insufficient documentation

## 2021-12-12 DIAGNOSIS — Z95810 Presence of automatic (implantable) cardiac defibrillator: Secondary | ICD-10-CM | POA: Diagnosis not present

## 2021-12-12 DIAGNOSIS — I5022 Chronic systolic (congestive) heart failure: Secondary | ICD-10-CM | POA: Diagnosis not present

## 2021-12-12 DIAGNOSIS — I110 Hypertensive heart disease with heart failure: Secondary | ICD-10-CM | POA: Insufficient documentation

## 2021-12-12 DIAGNOSIS — I639 Cerebral infarction, unspecified: Secondary | ICD-10-CM | POA: Diagnosis not present

## 2021-12-12 DIAGNOSIS — I6782 Cerebral ischemia: Secondary | ICD-10-CM | POA: Diagnosis not present

## 2021-12-12 DIAGNOSIS — H538 Other visual disturbances: Principal | ICD-10-CM | POA: Diagnosis present

## 2021-12-12 DIAGNOSIS — R42 Dizziness and giddiness: Secondary | ICD-10-CM | POA: Diagnosis not present

## 2021-12-12 DIAGNOSIS — R0789 Other chest pain: Secondary | ICD-10-CM | POA: Diagnosis not present

## 2021-12-12 DIAGNOSIS — L281 Prurigo nodularis: Secondary | ICD-10-CM

## 2021-12-12 DIAGNOSIS — R519 Headache, unspecified: Secondary | ICD-10-CM | POA: Insufficient documentation

## 2021-12-12 DIAGNOSIS — B20 Human immunodeficiency virus [HIV] disease: Secondary | ICD-10-CM | POA: Diagnosis present

## 2021-12-12 DIAGNOSIS — Z79899 Other long term (current) drug therapy: Secondary | ICD-10-CM | POA: Insufficient documentation

## 2021-12-12 DIAGNOSIS — Z7984 Long term (current) use of oral hypoglycemic drugs: Secondary | ICD-10-CM | POA: Diagnosis not present

## 2021-12-12 LAB — COMPREHENSIVE METABOLIC PANEL
ALT: 21 U/L (ref 0–44)
AST: 26 U/L (ref 15–41)
Albumin: 4.1 g/dL (ref 3.5–5.0)
Alkaline Phosphatase: 74 U/L (ref 38–126)
Anion gap: 12 (ref 5–15)
BUN: 18 mg/dL (ref 6–20)
CO2: 18 mmol/L — ABNORMAL LOW (ref 22–32)
Calcium: 9.7 mg/dL (ref 8.9–10.3)
Chloride: 111 mmol/L (ref 98–111)
Creatinine, Ser: 1.17 mg/dL (ref 0.61–1.24)
GFR, Estimated: >60 mL/min (ref >60)
Glucose, Bld: 95 mg/dL (ref 70–99)
Potassium: 3.9 mmol/L (ref 3.5–5.1)
Sodium: 141 mmol/L (ref 135–145)
Total Bilirubin: 0.3 mg/dL (ref 0.3–1.2)
Total Protein: 7.4 g/dL (ref 6.5–8.1)

## 2021-12-12 LAB — CBC WITH DIFFERENTIAL/PLATELET
Abs Immature Granulocytes: 0.03 10*3/uL (ref 0.00–0.07)
Basophils Absolute: 0.1 10*3/uL (ref 0.0–0.1)
Basophils Relative: 1 %
Eosinophils Absolute: 0.1 10*3/uL (ref 0.0–0.5)
Eosinophils Relative: 1 %
HCT: 42.2 % (ref 39.0–52.0)
Hemoglobin: 13.9 g/dL (ref 13.0–17.0)
Immature Granulocytes: 0 %
Lymphocytes Relative: 25 %
Lymphs Abs: 2.4 10*3/uL (ref 0.7–4.0)
MCH: 30.3 pg (ref 26.0–34.0)
MCHC: 32.9 g/dL (ref 30.0–36.0)
MCV: 91.9 fL (ref 80.0–100.0)
Monocytes Absolute: 0.8 10*3/uL (ref 0.1–1.0)
Monocytes Relative: 8 %
Neutro Abs: 6.1 10*3/uL (ref 1.7–7.7)
Neutrophils Relative %: 65 %
Platelets: 187 10*3/uL (ref 150–400)
RBC: 4.59 MIL/uL (ref 4.22–5.81)
RDW: 13.2 % (ref 11.5–15.5)
WBC: 9.4 10*3/uL (ref 4.0–10.5)
nRBC: 0 % (ref 0.0–0.2)

## 2021-12-12 LAB — URINALYSIS, ROUTINE W REFLEX MICROSCOPIC
Bacteria, UA: NONE SEEN
Bilirubin Urine: NEGATIVE
Glucose, UA: >=500 mg/dL — AB
Hgb urine dipstick: NEGATIVE
Ketones, ur: NEGATIVE mg/dL
Leukocytes,Ua: NEGATIVE
Nitrite: NEGATIVE
Protein, ur: NEGATIVE mg/dL
Specific Gravity, Urine: 1.01 (ref 1.005–1.030)
pH: 5 (ref 5.0–8.0)

## 2021-12-12 LAB — AMMONIA: Ammonia: 22 umol/L (ref 9–35)

## 2021-12-12 LAB — TROPONIN I (HIGH SENSITIVITY)
Troponin I (High Sensitivity): 12 ng/L (ref <18)
Troponin I (High Sensitivity): 11 ng/L (ref <18)

## 2021-12-12 LAB — ETHANOL: Alcohol, Ethyl (B): <10 mg/dL (ref <10)

## 2021-12-12 LAB — LIPASE, BLOOD: Lipase: 39 U/L (ref 11–51)

## 2021-12-12 MED ORDER — ROSUVASTATIN CALCIUM 5 MG PO TABS: 5.0000 mg | ORAL_TABLET | Freq: Every day | ORAL | Status: DC

## 2021-12-12 MED ORDER — CLOPIDOGREL BISULFATE 75 MG PO TABS
75.0000 mg | ORAL_TABLET | Freq: Every day | ORAL | Status: DC
  Administered 2021-12-13 – 2021-12-16 (×4): 75 mg via ORAL
  Filled 2021-12-12 (×4): qty 1

## 2021-12-12 MED ORDER — ASPIRIN 325 MG PO TABS
325.0000 mg | ORAL_TABLET | Freq: Once | ORAL | Status: AC
Start: 2021-12-12 — End: 2021-12-12
  Administered 2021-12-12: 325 mg via ORAL
  Filled 2021-12-12: qty 1

## 2021-12-12 MED ORDER — ACETAMINOPHEN 325 MG PO TABS
650.0000 mg | ORAL_TABLET | ORAL | Status: DC | PRN
  Administered 2021-12-13 – 2021-12-14 (×2): 650 mg via ORAL
  Filled 2021-12-12 (×2): qty 2

## 2021-12-12 MED ORDER — DOLUTEGRAVIR SODIUM 50 MG PO TABS
50.0000 mg | ORAL_TABLET | Freq: Every day | ORAL | Status: DC
  Administered 2021-12-13 – 2021-12-16 (×4): 50 mg via ORAL
  Filled 2021-12-12 (×4): qty 1

## 2021-12-12 MED ORDER — ROSUVASTATIN CALCIUM 20 MG PO TABS
20.0000 mg | ORAL_TABLET | Freq: Every day | ORAL | Status: DC
  Administered 2021-12-13 – 2021-12-16 (×4): 20 mg via ORAL
  Filled 2021-12-12 (×4): qty 1

## 2021-12-12 MED ORDER — ACETAMINOPHEN 650 MG RE SUPP: 650.0000 mg | RECTAL | Status: DC | PRN

## 2021-12-12 MED ORDER — ENOXAPARIN SODIUM 40 MG/0.4ML IJ SOSY
40.0000 mg | PREFILLED_SYRINGE | INTRAMUSCULAR | Status: DC
  Administered 2021-12-13 – 2021-12-16 (×4): 40 mg via SUBCUTANEOUS
  Filled 2021-12-12 (×5): qty 0.4

## 2021-12-12 MED ORDER — GABAPENTIN 300 MG PO CAPS
300.0000 mg | ORAL_CAPSULE | Freq: Three times a day (TID) | ORAL | Status: DC
  Administered 2021-12-13 – 2021-12-16 (×11): 300 mg via ORAL
  Filled 2021-12-12 (×11): qty 1

## 2021-12-12 MED ORDER — CLOPIDOGREL BISULFATE 300 MG PO TABS
300.0000 mg | ORAL_TABLET | Freq: Once | ORAL | Status: AC
  Administered 2021-12-12: 300 mg via ORAL
  Filled 2021-12-12: qty 1

## 2021-12-12 MED ORDER — EMTRICITAB-RILPIVIR-TENOFOV AF 200-25-25 MG PO TABS
1.0000 | ORAL_TABLET | Freq: Every day | ORAL | Status: DC
  Administered 2021-12-13 – 2021-12-16 (×4): 1 via ORAL
  Filled 2021-12-12 (×4): qty 1

## 2021-12-12 MED ORDER — ACETAMINOPHEN 160 MG/5ML PO SOLN: 650.0000 mg | ORAL | Status: DC | PRN

## 2021-12-12 MED ORDER — STROKE: EARLY STAGES OF RECOVERY BOOK
Freq: Once | Status: AC
  Filled 2021-12-12: qty 1

## 2021-12-12 MED ORDER — EMPAGLIFLOZIN 10 MG PO TABS
10.0000 mg | ORAL_TABLET | Freq: Every day | ORAL | Status: DC
  Administered 2021-12-13 – 2021-12-16 (×4): 10 mg via ORAL
  Filled 2021-12-12 (×4): qty 1

## 2021-12-12 MED ORDER — AMIODARONE HCL 200 MG PO TABS
100.0000 mg | ORAL_TABLET | Freq: Every day | ORAL | Status: DC
  Administered 2021-12-13 – 2021-12-16 (×4): 100 mg via ORAL
  Filled 2021-12-12 (×4): qty 1

## 2021-12-12 MED ORDER — TRAZODONE HCL 50 MG PO TABS
50.0000 mg | ORAL_TABLET | Freq: Every evening | ORAL | Status: DC | PRN
  Administered 2021-12-13 – 2021-12-15 (×3): 50 mg via ORAL
  Filled 2021-12-12 (×3): qty 1

## 2021-12-12 NOTE — ED Provider Triage Note (Signed)
Emergency Medicine Provider Triage Evaluation Note  [name] , a 56 y.o. male  was evaluated in triage.  Pt complains of headache, blurred vision and dizziness, chest pressure. He feels like things are moving. He reports that his vision being blurry which doesn't improve with one eye closed.  He reports that on the 6th he had a syncopal event on the commode.   He feels like his chest pressure has slightly improved.   He does feel like he has been confused today.   Physical Exam  BP (!) 138/95 (BP Location: Right Arm)   Pulse 79   Temp 99.1 F (37.3 C) (Oral)   Resp 16   Ht 5\' 4"  (1.626 m)   Wt 80.7 kg   SpO2 95%   BMI 30.55 kg/m  Gen:   Awake, no distress   Resp:  Normal effort  MSK:   Moves extremities without difficulty  Other:  Normal speech.   Medical Decision Making  Medically screening exam initiated at 6:23 PM.  Appropriate orders placed.  [name] was informed that the remainder of the evaluation will be completed by another provider, this initial triage assessment does not replace that evaluation, and the importance of remaining in the ED until their evaluation is complete.     Manus Rudd, Cristina Gong 12/12/21 1831

## 2021-12-12 NOTE — Telephone Encounter (Signed)
Patient calls nurse line requesting to speak with provider regarding blurry vision, headaches, and dizziness. Reports onset this AM around 1100. Patient states that he took BP medications today. Patient states that he is unable to check BP at home due to not having batteries in machine.    Advised that patient be evaluated today either in UC or the ED. Patient will have a friend drive him.  Patient verbalizes understanding.   Veronda Prude, RN

## 2021-12-12 NOTE — ED Triage Notes (Addendum)
Pt c/o blurred vision, headache, and dizziness since waking up this morning.  Pt denies nausea, vomiting or shortness of breath.  Pt c/o mid-sternal chest pressure. Pt states he felt dizzy and fell hitting head on 7/6. States he did have LOC, family picked him up and put him in bed and then he was okay until today. Pt does not take blood thinners.

## 2021-12-12 NOTE — ED Notes (Signed)
Neurology at bedside.

## 2021-12-12 NOTE — Consult Note (Signed)
Neurology Consultation Reason for Consult: Dizziness and blurred vision Referring Physician: Rodena Medin, P  CC: Dizziness and blurred vision  History is obtained from: Patient  HPI: SHAKA CARDIN is a 56 y.o. male with a history of hypertension, heart failure, HIV who presents with blurred vision and unsteadiness that started on awakening this morning.  He was in his normal state of health when he went to bed at 11 PM last night.  On awakening at 11 AM he tried to watch prices right, but found to be blurry.  He has tried closing one eye and then the other, and both look equally blurry.  He has trouble reading.  He also has felt unsteady over the course of the day. Past Medical History:  Diagnosis Date   Chronic systolic heart failure (HCC)    a. Echo 12/05/11:  EF 20-25%, diff HK with mild sparing of IL wall, mild AI, mod MR, mild LAE, mild RVE, mild reduced RVF.;  b.  Echo 05/11/2012:  Mild LVH, EF 20-25%, Gr 1 diast dysfn, mod MR, mild LAE   Depression    G6PD deficiency    GERD (gastroesophageal reflux disease)    Hepatitis    Hep B and Hep C (patient does not report this but these are listed in previous notes.)   HIV infection (HCC)    Hypertension    NICM (nonischemic cardiomyopathy) (HCC)    cardia CTA 8/13 negative for obstructive CAD   Presence of permanent cardiac pacemaker    VT (ventricular tachycardia) (HCC)      Family History  Problem Relation Age of Onset   Lung cancer Mother    Colon cancer Other 16     Social History:  reports that he has never smoked. He has never used smokeless tobacco. He reports current alcohol use of about 1.0 standard drink of alcohol per week. He reports that he does not currently use drugs after having used the following drugs: Marijuana. Frequency: 7.00 times per week.   Exam: Current vital signs: BP 111/88   Pulse 71   Temp 99.1 F (37.3 C) (Oral)   Resp 16   Ht 5\' 4"  (1.626 m)   Wt 80.7 kg   SpO2 97%   BMI 30.55 kg/m  Vital  signs in last 24 hours: Temp:  [99.1 F (37.3 C)] 99.1 F (37.3 C) (07/14 1753) Pulse Rate:  [62-79] 71 (07/14 2118) Resp:  [13-16] 16 (07/14 2118) BP: (111-138)/(88-95) 111/88 (07/14 2118) SpO2:  [95 %-97 %] 97 % (07/14 2118) Weight:  [80.7 kg] 80.7 kg (07/14 1807)   Physical Exam  Constitutional: Appears well-developed and well-nourished.  Psych: Affect appropriate to situation Eyes: No scleral injection HENT: No OP obstruction MSK: no joint deformities.  Cardiovascular: Normal rate and regular rhythm.  Respiratory: Effort normal, non-labored breathing GI: Soft.  No distension. There is no tenderness.  Skin: WDI  Neuro: Mental Status: Patient is awake, alert, oriented to person, place, month, year, and situation. Patient is able to give a clear and coherent history. No signs of aphasia or neglect Cranial Nerves: II: Visual Fields are full. Pupils are equal, round, and reactive to light.   III,IV, VI: EOMI without ptosis or diploplia.  V: Facial sensation is symmetric to temperature VII: Facial movement is symmetric.  VIII: hearing is intact to voice X: Uvula elevates symmetrically XI: Shoulder shrug is symmetric. XII: tongue is midline without atrophy or fasciculations.  Motor: He might have a very mild left pronator drift, but  this is questionable, he does have 4/5 weakness to confrontation of the left leg, 5/5 in bilateral upper extremities to confrontation and in the right leg. Sensory: Sensation is symmetric to light touch and temperature in the arms and legs. Cerebellar: He has mild ataxia in both the left arm and leg     I have reviewed labs in epic and the results pertinent to this consultation are: Creatinine 1.17  I have reviewed the images obtained: CT head-negative  Impression: 56 year old male who presents with left-sided weakness and blurred vision that started abruptly on awakening this morning.  I strongly suspect that this represents a small  ischemic stroke, he will need to be admitted for secondary stroke evaluation as well as physical therapy.  He does have an MRI safe AICD, and if this is able to be obtained over the weekend or if he is still here on Monday then I do think it would be reasonable to pursue this.  In the interim I would treat this as a stroke.  Recommendations: - HgbA1c, fasting lipid panel - MRI of the brain without contrast - Frequent neuro checks - Echocardiogram - CTA head and neck - Prophylactic therapy-Antiplatelet med: Aspirin - dose 81mg  and plavix 75mg  daily  after 300mg  load  - Risk factor modification - Telemetry monitoring - PT consult, OT consult, Speech consult - Stroke team to follow    , MD [facility] [phone]  If 7pm- 7am, please page neurology on call as listed in AMION.

## 2021-12-12 NOTE — ED Provider Notes (Signed)
[facility] EMERGENCY DEPARTMENT Provider Note   CSN: [id number] Arrival date & time: 12/12/21  1740     History  Chief Complaint  Patient presents with   Headache    [name] is a 56 y.o. male.  56 year old male with prior medical history as detailed below presents for evaluation.  Patient reports that he woke up this morning with bilateral blurry vision, headache, and difficulty ambulating.  Patient reports he feels unsteady when he tries to ambulate.  Symptoms were noted around 11 AM when he awoke.  He denies prior history of stroke.  His last known normal was yesterday evening when he went to sleep.    The history is provided by the patient and medical records.       Home Medications Prior to Admission medications   Medication Sig Start Date End Date Taking? Authorizing Provider  allopurinol (ZYLOPRIM) 100 MG tablet TAKE 1 TABLET(100 MG) BY MOUTH TWICE DAILY 11/26/21   Adonis Huguenin, NP  amiodarone (PACERONE) 200 MG tablet Take 0.5 tablets (100 mg total) by mouth daily. 09/03/21   Bensimhon, Bevelyn Buckles, MD  carvedilol (COREG) 25 MG tablet Take 1 tablet (25 mg total) by mouth 2 (two) times daily with a meal. 09/03/21   Bensimhon, Bevelyn Buckles, MD  cholecalciferol (VITAMIN D) 25 MCG (1000 UNIT) tablet Take 1,000 Units by mouth daily.    [provider]  clobetasol ointment (TEMOVATE) 0.05 % Apply 1 application topically 2 (two) times daily. 12/28/16   [provider]  colchicine 0.6 MG tablet Take 1 tablet (0.6 mg total) by mouth daily. 11/26/21   Adonis Huguenin, NP  diclofenac sodium (VOLTAREN) 1 % GEL Apply 2 g topically 4 (four) times daily. 05/30/18   Westley Chandler, MD  dolutegravir (TIVICAY) 50 MG tablet TAKE 1 TABLET BY MOUTH DAILY 11/11/21   Comer, [name], MD  empagliflozin (JARDIANCE) 10 MG TABS tablet Take 1 tablet (10 mg total) by mouth daily. 09/26/21   Bensimhon, Bevelyn Buckles, MD  emtricitabine-rilpivir-tenofovir AF (ODEFSEY)  200-25-25 MG TABS tablet Take 1 tablet by mouth daily. 11/11/21   Comer, [name], MD  fluticasone (FLONASE) 50 MCG/ACT nasal spray SHAKE LIQUID AND USE 2 SPRAYS IN EACH NOSTRIL DAILY 01/17/21   Bess Kinds, MD  furosemide (LASIX) 40 MG tablet Take 1 tablet (40 mg total) by mouth daily as needed for fluid or edema. 09/03/21   Bensimhon, Bevelyn Buckles, MD  gabapentin (NEURONTIN) 300 MG capsule Take 300 mg by mouth 3 (three) times daily.    [provider]  hydrALAZINE (APRESOLINE) 50 MG tablet Take 1 tablet (50 mg total) by mouth 2 (two) times daily. 09/03/21   Bensimhon, Bevelyn Buckles, MD  MEDROL 4 MG TBPK tablet Take 4 mg by mouth See admin instructions. 6,5,4,3,2,1 03/24/21   [provider]  Multiple Vitamins-Minerals (CENTRUM SILVER 50+MEN PO) Take 1 tablet by mouth daily.    [provider]  Omega-3 Fatty Acids (FISH OIL) 1000 MG CPDR Take 1,000 mg by mouth daily.    [provider]  ondansetron (ZOFRAN) 4 MG tablet Take 4 mg by mouth every 8 (eight) hours as needed for nausea or vomiting. 03/26/17   [provider]  rosuvastatin (CRESTOR) 5 MG tablet Take 1 tablet (5 mg total) by mouth daily. 07/07/21 07/07/22  Bess Kinds, MD  sacubitril-valsartan (ENTRESTO) 97-103 MG Take 1 tablet by mouth 2 (two) times daily. 09/03/21   Bensimhon, Bevelyn Buckles, MD  spironolactone (  ALDACTONE) 25 MG tablet Take 1 tablet (25 mg total) by mouth daily. 09/03/21   Bensimhon, Bevelyn Buckles, MD  traZODone (DESYREL) 50 MG tablet TAKE 1 TABLET(50 MG) BY MOUTH AT BEDTIME 10/20/21   Bess Kinds, MD  triamcinolone cream (KENALOG) 0.1 % Apply 1 application topically 3 (three) times daily. 01/14/17   [provider]      Allergies    Bactrim and Sulfamethoxazole-trimethoprim    Review of Systems   Review of Systems  All other systems reviewed and are negative.   Physical Exam Updated Vital Signs BP 130/90   Pulse 70   Temp 99.1 F (37.3 C) (Oral)   Resp 16   Ht 5\' 4"  (1.626 m)    Wt 80.7 kg   SpO2 97%   BMI 30.55 kg/m  Physical Exam Vitals and nursing note reviewed.  Constitutional:      General: He is not in acute distress.    Appearance: Normal appearance. He is well-developed.  HENT:     Head: Normocephalic and atraumatic.  Eyes:     Conjunctiva/sclera: Conjunctivae normal.     Pupils: Pupils are equal, round, and reactive to light.  Cardiovascular:     Rate and Rhythm: Normal rate and regular rhythm.     Heart sounds: Normal heart sounds.  Pulmonary:     Effort: Pulmonary effort is normal. No respiratory distress.     Breath sounds: Normal breath sounds.  Abdominal:     General: There is no distension.     Palpations: Abdomen is soft.     Tenderness: There is no abdominal tenderness.  Musculoskeletal:        General: No deformity. Normal range of motion.     Cervical back: Normal range of motion and neck supple.  Skin:    General: Skin is warm and dry.  Neurological:     General: No focal deficit present.     Mental Status: He is alert and oriented to person, place, and time.     GCS: GCS eye subscore is 4. GCS verbal subscore is 5. GCS motor subscore is 6.     Cranial Nerves: No cranial nerve deficit or dysarthria.     Sensory: No sensory deficit.     Comments: Mild ataxia present     ED Results / Procedures / Treatments   Labs (all labs ordered are listed, but only abnormal results are displayed) Labs Reviewed  COMPREHENSIVE METABOLIC PANEL - Abnormal; Notable for the following components:      Result Value   CO2 18 (*)    All other components within normal limits  URINALYSIS, ROUTINE W REFLEX MICROSCOPIC - Abnormal; Notable for the following components:   Color, Urine STRAW (*)    Glucose, UA >=500 (*)    All other components within normal limits  ETHANOL  CBC WITH DIFFERENTIAL/PLATELET  LIPASE, BLOOD  AMMONIA  TROPONIN I (HIGH SENSITIVITY)  TROPONIN I (HIGH SENSITIVITY)    EKG EKG Interpretation  Date/Time:  Friday December 12 2021 21:08:04 EDT Ventricular Rate:  74 PR Interval:  167 QRS Duration: 125 QT Interval:  430 QTC Calculation: 478 R Axis:   86 Text Interpretation: Sinus rhythm Nonspecific intraventricular conduction delay Confirmed by 02[birth year]19[birth year]1989 [phone]) on 12/12/2021 9:10:34 PM  Radiology CT HEAD WO CONTRAST (12/14/2021)  Result Date: 12/12/2021 CLINICAL DATA:  Head trauma, abnormal mental status (Age 37-64y). Headache, vision change EXAM: CT HEAD WITHOUT CONTRAST TECHNIQUE: Contiguous axial images were obtained from the base of  the skull through the vertex without intravenous contrast. RADIATION DOSE REDUCTION: This exam was performed according to the departmental dose-optimization program which includes automated exposure control, adjustment of the mA and/or kV according to patient size and/or use of iterative reconstruction technique. COMPARISON:  None Available. FINDINGS: Brain: Normal anatomic configuration. Parenchymal volume loss is commensurate with the patient's age. Mild periventricular white matter changes are present likely reflecting the sequela of small vessel ischemia. No abnormal intra or extra-axial mass lesion or fluid collection. No abnormal mass effect or midline shift. No evidence of acute intracranial hemorrhage or infarct. Ventricular size is normal. Cerebellum unremarkable. Vascular: No asymmetric hyperdense vasculature at the skull base. Skull: Intact Sinuses/Orbits: Paranasal sinuses are clear. Orbits are unremarkable. Other: Mastoid air cells and middle ear cavities are clear. IMPRESSION: No acute intracranial abnormality. Mild senescent change. Electronically Signed   By: Helyn Numbers M.D.   On: 12/12/2021 19:27   DG Chest 2 View  Result Date: 12/12/2021 CLINICAL DATA:  Chest pressure. EXAM: CHEST - 2 VIEW COMPARISON:  Two-view chest x-ray 01/25/2017 FINDINGS: Heart size is normal. Pacing and defibrillator wires are stable. Lungs are clear. No edema or effusion is present. The  visualized soft tissues and bony thorax are unremarkable. IMPRESSION: No active cardiopulmonary disease. Electronically Signed   By: Marin Roberts M.D.   On: 12/12/2021 18:59    Procedures Procedures    Medications Ordered in ED Medications  clopidogrel (PLAVIX) tablet 300 mg (has no administration in time range)  clopidogrel (PLAVIX) tablet 75 mg (has no administration in time range)  aspirin tablet 325 mg (has no administration in time range)    ED Course/ Medical Decision Making/ A&P                           Medical Decision Making Risk Decision regarding hospitalization.    Medical Screen Complete  This patient presented to the ED with complaint of vision change, ataxia.  This complaint involves an extensive number of treatment options. The initial differential diagnosis includes, but is not limited to, CVA, metabolic abnormality, etc.  This presentation is: Acute, Self-Limited, Previously Undiagnosed, Uncertain Prognosis, Complicated, Systemic Symptoms, and Threat to Life/Bodily Function  Patient presents with complaint of blurry vision and ataxia.  Presentation is concerning for possible CVA.  Neuro aware of case and consulting.  Patient would benefit from admission for further work-up and treatment.  Family medicine teaching team aware of case and will evaluate for admission.  Additional history obtained:  External records from outside sources obtained and reviewed including prior ED visits and prior Inpatient records.    Lab Tests:  I ordered and personally interpreted labs.  The pertinent results include: CBC C, CMP, troponin, UA, EtOH, lipase, ammonia   Imaging Studies ordered:  I ordered imaging studies including CT head, chest x-ray I independently visualized and interpreted obtained imaging which showed NAD I agree with the radiologist interpretation.   Cardiac Monitoring:  The patient was maintained on a cardiac monitor.  I personally  viewed and interpreted the cardiac monitor which showed an underlying rhythm of: NSR   Problem List / ED Course:  Blurry vision, ataxia   Reevaluation:  After the interventions noted above, I reevaluated the patient and found that they have: stayed the same   Disposition:  After consideration of the diagnostic results and the patients response to treatment, I feel that the patent would benefit from admission.   CRITICAL CARE Performed  by: Wynetta Fines   Total critical care time: 30 minutes  Critical care time was exclusive of separately billable procedures and treating other patients.  Critical care was necessary to treat or prevent imminent or life-threatening deterioration.  Critical care was time spent personally by me on the following activities: development of treatment plan with patient and/or surrogate as well as nursing, discussions with consultants, evaluation of patient's response to treatment, examination of patient, obtaining history from patient or surrogate, ordering and performing treatments and interventions, ordering and review of laboratory studies, ordering and review of radiographic studies, pulse oximetry and re-evaluation of patient's condition.          Final Clinical Impression(s) / ED Diagnoses Final diagnoses:  Ataxia    Rx / DC Orders ED Discharge Orders     None         Wynetta Fines, MD 12/12/21 2250

## 2021-12-12 NOTE — H&P (Addendum)
Hospital Admission History and Physical Service Pager: [phone]  Patient name: [name] Medical record number: [id number] Date of Birth: 04-26-1966 Age: 56 y.o. Gender: male  Primary Care Provider: Bess Kinds, MD Consultants: Neurology Code Status: FULL CODE Preferred Emergency Contact:   Contact Information     Name Relation Home Work Mobile   Richardson,David Significant other [phone]  [phone]   Chief Complaint: Blurred Vision   Assessment and Plan: EMITT BURGET is a 56 y.o. male presenting with new onset blurred vision. Differential for this patient's presentation of this includes ischemic/hemorrhagic stroke, alcohol use, syncope, metabolic encephalopathy, seizure, migraine with aura. Ethanol level normal, patient denies syncopal episode today but has had previous episodes. Electrolyte panel is grossly normal, patient denies seizure symptoms and headache making these diagnoses less likely. Neurology consulted in the ED, recommend admission for CVA work up.   * Blurred vision Concern for stroke. Vitals signs stable. No focal deficits on neuro exam. CT Head w/o contrast negative. CTA Head/Neck without acute findings. Patient took home blood pressure medicine that he brought from home while in the ED- education provided on allowing for permissive HTN. Received Aspirin 325mg  and Plavix 300mg  in ED. Patient passed bedside swallow test in the ED.  -Admit to FPTS, progressive, attending Dr. Manson Passey  -Neurology consulted in ED, per their recs:  -Aspirin 81mg  daily  -Plavix 75mg  daily  -MR Brain WO contrast  -ECHO  -PT/OT/SLP eval  -q4 Neuro checks -TSH -A1c -Lipid panel -Permissive hypertension -Increase Crestor to 20mg  daily -Up with assistance -Lovenox for VTE ppx   HIV (human immunodeficiency virus infection) (HCC) Continue home medications. HIV-1 RNA quant undetectable 11/04/21 -Tivicay -Odefsey  Chronic systolic heart failure (HCC) Patient has  Medtronic CRT-D biventricular pacemaker and defibrillator due to non-ischemic cardiomyopathy. EF 20-25% in 2014. ECHO 03/19/22 EF 55-60%. No new EKG changes from previous.  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 male presenting with blurred vision and dizziness.   He got up this morning and turned on the TV to watch the price is right, as he does every morning. He noticed that the people on TV were blurry. He couldn't get his vision to focus. He then walked to the mailbox and felt dizzy. He called his friend to have his BP checked, states it was 129/92. He doesn't normally check his BP. He is on BP meds, notes good adherence to it.   Denies any weakness. Felt lightheaded. Denies room-spinning sensation but endorses a sense of imbalance. No speech changes. Endorses central chest pressure that started today- does not radiate. Took two Aspirin tablets prior to ED presentation. No SOB, but felt like he was "panting". Notes "a little" lower abdominal pain when he defecates. He just had  BM in the ED. No urinary concerns. No N/V. No numbness or tingling.  He has a pacemaker for a "rhythm issues". "It has not gone off at all in the last 4 years".  He does note that he is a daily beer drinker. States that he had fallen after drinking on July 6th (was celebrated his friends birthday). He does not recall this.  He notes hx of falls in the past- has happened with low BP's and drinking.   Emergency Department In the ED, vitals signs were stable, and labs grossly unremarkable. CT Head w/o  contrast negative for acute intracranial abnormality.  Review Of Systems: Per HPI   Pertinent Past Medical History: HIV, HFrEF HLD, gout, HTN, Hx of Hep C s/p treatment  Remainder reviewed in history tab.   Pertinent Past Surgical History: Ankle surgery, AICD,   Remainder reviewed in history tab.   Pertinent Social History: Tobacco use: No Alcohol use: Drinks two beers a day, most days. Liquor sometimes, prefers Vodka shots- usually just for special occassions such as Holidays and birthdays.   Other Substance use: None Lives alone  Pertinent Family History: Mother: metastatic breast cancer, passed in 2010  Remainder reviewed in history tab.   Important Outpatient Medications: Patient reports: Allopurinol and Colchine both daily for Gout Gabapentin His "blood pressure medications." (Listed in chart: Entresto, Coreg, hydralazine, spironolactone, Lasix) Cholesterol medication  Remainder reviewed in medication history.   Objective: BP 130/90   Pulse 70   Temp 99.1 F (37.3 C) (Oral)   Resp 16   Ht 5\' 4"  (1.626 m)   Wt 80.7 kg   SpO2 97%   BMI 30.55 kg/m  Exam: General: A&O, NAD Neuro: PEERLA. Cranial nerves: II through XII are intact. Normal visual fields bilaterally. Sensation normal upper and lower ext's bilaterally. Strength 5/5 in upper and lower ext's bilaterally. Normal position sense b/l. Rhomberg normal. Protator drift normal. Gait normal. Normal speech, answers questions appropriately. AxO X4 HEENT: No sign of trauma, EOM grossly intact Cardiac: distant heart sounds, RRR, no murmurs Respiratory: CTAB, normal WOB, no w/c/r GI: Soft, NTTP, central adiposity, non-distended, no R/G Extremities: NTTP, no peripheral edema. Psych: Appropriate mood and affect   Labs:  CBC BMET  Recent Labs  Lab 12/12/21 1836  WBC 9.4  HGB 13.9  HCT 42.2  PLT 187   Recent Labs  Lab 12/12/21 1836  NA 141  K 3.9  CL 111  CO2 18*  BUN 18  CREATININE 1.17  GLUCOSE 95   CALCIUM 9.7    Pertinent additional labs none.   EKG: My own interpretation (not copied from electronic read) Sinus rhythm, pacer spikes, normal axis   Imaging Studies Performed:  Imaging Study (ie. Chest x-ray) Impression from Radiologist:   12/12/21 CXR IMPRESSION: No active cardiopulmonary disease.   My Interpretation: ICD visible, no acute findings.  12/12/21 CT HEAD WO CONTRAST IMPRESSION: No acute intracranial abnormality. Mild senescent change.  Celine Mans, MD 12/13/2021, 11:54 PM PGY-1, [facility]  FPTS Intern pager: [phone], text pages welcome Secure chat group [facility] Teaching Service   FPTS Upper-Level Resident Addendum   I have independently interviewed and examined the patient. I have discussed the above with the original author and agree with their documentation. My edits for correction/addition/clarification are included where appropriate. Please see also any attending notes.   Sabino Dick, DO PGY-3, [facility] 12/13/2021 1:20 AM  FPTS Service pager: [phone] (text pages welcome through [facility])

## 2021-12-12 NOTE — H&P (Incomplete)
Hospital Admission History and Physical Service Pager: [phone]  Patient name: [name] Medical record number: [id number] Date of Birth: [birth year] Age: 56 y.o. Gender: male  Primary Care Provider: Bess Kinds, MD Consultants: Neurology Code Status: FULL CODE Preferred Emergency Contact:   Contact Information     Name Relation Home Work Mobile   Richardson,David Significant other [phone]  [phone]   Chief Complaint: Blurred Vision   Assessment and Plan: [name] is a 56 y.o. male presenting with *** . Differential for this patient's presentation of this includes ***.  (***describe here why less likely than primary diagnosis-delete this once complete***)  No notes have been filed under this hospital service. Service: Family Medicine      FEN/GI: Heart Healthy VTE Prophylaxis: Lovenox  Disposition: Progressive for frequent Neuro checks  History of Present Illness:  [name] is a 56 y.o. male presenting with blurred vision and dizziness.   He got up this morning and turned on the TV to watch the price is right, as he does every morning. He noticed that the people on TV were blurry. He couldn't get his vision to focus. He then walked to the mailbox and felt dizzy. He called his friend to have his BP checked, states it was 129/92. He doesn't normally check his BP. He is on BP meds, notes good adherence to it.   Denies any weakness. Felt lightheaded. Denies room-spinning sensation but endorses a sense of imbalance. No speech changes. Endorses central chest pressure that started today- does not radiate. Took two Aspirin tablets prior to ED presentation. No SOB, but felt like he was "panting". Notes "a little" lower abdominal pain when he defecates. He just had  BM in the ED. No urinary concerns. No N/V. No numbness or tingling.  He has a pacemaker for a "rhythm issues". "It has not gone off at all in the last 4 years".  He does note that he  is a daily beer drinker. States that he had fallen after drinking on July 6th (was celebrated his friends birthday). He does not recall this.   He notes hx of falls in the past- has happened with low BP's and drinking.   In the ED, ***  Review Of Systems: Per HPI   Pertinent Past Medical History: HIV, HFrEF HLD, gout, HTN, Hx of Hep C s/p treatment  Remainder reviewed in history tab.   Pertinent Past Surgical History: Ankle surgery, AICD,   Remainder reviewed in history tab.  Pertinent Social History: Tobacco use: No Alcohol use: Drinks two beers a day, most days. Liquor sometimes, prefers Vodka shots- usually just for special occassions such as Holidays and birthdays.   Other Substance use: None Lives alone  Pertinent Family History: Mother: metastatic breast cancer, passed in 2010  Remainder reviewed in history tab.   Important Outpatient Medications: *** Remainder reviewed in medication history.   Objective: BP 130/90   Pulse 70   Temp 99.1 F (37.3 C) (Oral)   Resp 16   Ht 5\' 4"  (1.626 m)   Wt 80.7 kg   SpO2 97%   BMI 30.55 kg/m  Exam: General: *** Eyes: *** ENTM: *** Neck: *** Cardiovascular: *** Respiratory: *** Gastrointestinal: *** MSK: *** Derm: *** Neuro: *** Psych: ***  Labs:  CBC BMET  Recent Labs  Lab 12/12/21 1836  WBC 9.4  HGB 13.9  HCT 42.2  PLT 187   Recent Labs  Lab 12/12/21 1836  NA 141  K 3.9  CL 111  CO2 18*  BUN 18  CREATININE 1.17  GLUCOSE 95  CALCIUM 9.7    Pertinent additional labs ***.  EKG: My own interpretation (not copied from electronic read) ***    Imaging Studies Performed:  Imaging Study (ie. Chest x-ray) Impression from Radiologist: ***   My Interpretation: Celine Mans, MD 12/12/2021, 11:54 PM PGY-1, [facility]  FPTS Intern pager: [phone], text pages welcome Secure chat group [facility] Teaching Service

## 2021-12-13 ENCOUNTER — Observation Stay (HOSPITAL_BASED_OUTPATIENT_CLINIC_OR_DEPARTMENT_OTHER): Payer: Medicare Other

## 2021-12-13 ENCOUNTER — Encounter (HOSPITAL_COMMUNITY): Payer: Self-pay | Admitting: Family Medicine

## 2021-12-13 ENCOUNTER — Observation Stay (HOSPITAL_COMMUNITY): Payer: Medicare Other

## 2021-12-13 DIAGNOSIS — I5022 Chronic systolic (congestive) heart failure: Secondary | ICD-10-CM | POA: Diagnosis not present

## 2021-12-13 DIAGNOSIS — H538 Other visual disturbances: Secondary | ICD-10-CM

## 2021-12-13 DIAGNOSIS — I361 Nonrheumatic tricuspid (valve) insufficiency: Secondary | ICD-10-CM

## 2021-12-13 DIAGNOSIS — Z95810 Presence of automatic (implantable) cardiac defibrillator: Secondary | ICD-10-CM | POA: Diagnosis not present

## 2021-12-13 DIAGNOSIS — I639 Cerebral infarction, unspecified: Secondary | ICD-10-CM | POA: Diagnosis not present

## 2021-12-13 DIAGNOSIS — B20 Human immunodeficiency virus [HIV] disease: Secondary | ICD-10-CM

## 2021-12-13 DIAGNOSIS — L281 Prurigo nodularis: Secondary | ICD-10-CM

## 2021-12-13 LAB — ECHOCARDIOGRAM COMPLETE
AR max vel: 2.49 cm2
AV Area VTI: 2.32 cm2
AV Area mean vel: 2.4 cm2
AV Mean grad: 5 mmHg
AV Peak grad: 9 mmHg
Ao pk vel: 1.5 m/s
Area-P 1/2: 3.27 cm2
Calc EF: 51 %
Height: 64 in
S' Lateral: 4.2 cm
Single Plane A2C EF: 57.1 %
Single Plane A4C EF: 44.9 %
Weight: 2848 oz

## 2021-12-13 LAB — LIPID PANEL
Cholesterol: 137 mg/dL (ref 0–200)
HDL: 68 mg/dL (ref >40)
LDL Cholesterol: 48 mg/dL (ref 0–99)
Total CHOL/HDL Ratio: 2 RATIO
Triglycerides: 103 mg/dL (ref <150)
VLDL: 21 mg/dL (ref 0–40)

## 2021-12-13 LAB — TSH: TSH: 4.054 u[IU]/mL (ref 0.350–4.500)

## 2021-12-13 LAB — HEMOGLOBIN A1C
Hgb A1c MFr Bld: 5 % (ref 4.8–5.6)
Mean Plasma Glucose: 96.8 mg/dL

## 2021-12-13 MED ORDER — IOHEXOL 350 MG/ML SOLN
75.0000 mL | Freq: Once | INTRAVENOUS | Status: AC | PRN
Start: 2021-12-13 — End: 2021-12-13
  Administered 2021-12-13: 75 mL via INTRAVENOUS

## 2021-12-13 MED ORDER — CARVEDILOL 12.5 MG PO TABS
25.0000 mg | ORAL_TABLET | Freq: Two times a day (BID) | ORAL | Status: DC
Start: 2021-12-13 — End: 2021-12-16
  Administered 2021-12-13 – 2021-12-16 (×7): 25 mg via ORAL
  Filled 2021-12-13 (×7): qty 2

## 2021-12-13 MED ORDER — ALLOPURINOL 100 MG PO TABS
100.0000 mg | ORAL_TABLET | Freq: Two times a day (BID) | ORAL | Status: DC
  Administered 2021-12-13 – 2021-12-16 (×7): 100 mg via ORAL
  Filled 2021-12-13 (×7): qty 1

## 2021-12-13 MED ORDER — ASPIRIN 81 MG PO TBEC
81.0000 mg | DELAYED_RELEASE_TABLET | Freq: Every day | ORAL | Status: DC
  Administered 2021-12-13 – 2021-12-16 (×4): 81 mg via ORAL
  Filled 2021-12-13 (×4): qty 1

## 2021-12-13 NOTE — Assessment & Plan Note (Addendum)
Continue home medications. HIV-1 RNA quant undetectable 11/04/21 -Tivicay -Odefsey

## 2021-12-13 NOTE — Assessment & Plan Note (Addendum)
Patient has Medtronic CRT-D biventricular pacemaker and defibrillator due to non-ischemic cardiomyopathy. EF 20-25% in 2014. ECHO 03/19/22 EF 55-60%. No new EKG changes from previous.  -Hold home blood pressure medications for permissive hypertension in the setting of stroke -Hold Entresto, Spirolactone, Hydralazine -Continue Jardiance -Restart home Coreg -Follow-up ECHO

## 2021-12-13 NOTE — Progress Notes (Addendum)
STROKE TEAM PROGRESS NOTE   INTERVAL HISTORY No family at the bedside. RN at bedside. C/o still with vision problems, blurry vision and headache when moves eyes laterally. Dizziness is better than yesterday.  He still has some blurred vision but it is improving.  MRI pending. Patient has ICD/PPM, will need to make sure if compatible with MRI. He is alert and oriented. Smile is symmetric. Tracks, crosses midline, no visual deficits. Uppers 5/5, lowers 5/5. No ataxia. No sensation deficits  Vitals:   12/13/21 0100 12/13/21 0115 12/13/21 0226 12/13/21 0430  BP: 96/74  103/74 107/82  Pulse: 73 75 63 62  Resp: 17 (!) 21 15 16   Temp:   98.1 F (36.7 C) 97.8 F (36.6 C)  TempSrc:   Oral Oral  SpO2: 96% 96% 97% 98%  Weight:      Height:       CBC:  Recent Labs  Lab 12/12/21 1836  WBC 9.4  NEUTROABS 6.1  HGB 13.9  HCT 42.2  MCV 91.9  PLT 187   Basic Metabolic Panel:  Recent Labs  Lab 12/12/21 1836  NA 141  K 3.9  CL 111  CO2 18*  GLUCOSE 95  BUN 18  CREATININE 1.17  CALCIUM 9.7   Lipid Panel:  Recent Labs  Lab 12/13/21 0316  CHOL 137  TRIG 103  HDL 68  CHOLHDL 2.0  VLDL 21  LDLCALC 48   HgbA1c:  Recent Labs  Lab 12/13/21 0316  HGBA1C 5.0   Urine Drug Screen: No results for input(s): "LABOPIA", "COCAINSCRNUR", "LABBENZ", "AMPHETMU", "THCU", "LABBARB" in the last 168 hours.  Alcohol Level  Recent Labs  Lab 12/12/21 1836  ETH <10    IMAGING past 24 hours CT ANGIO HEAD NECK W WO CM  Result Date: 12/13/2021 CLINICAL DATA:  Follow-up examination for stroke. EXAM: CT ANGIOGRAPHY HEAD AND NECK TECHNIQUE: Multidetector CT imaging of the head and neck was performed using the standard protocol during bolus administration of intravenous contrast. Multiplanar CT image reconstructions and MIPs were obtained to evaluate the vascular anatomy. Carotid stenosis measurements (when applicable) are obtained utilizing NASCET criteria, using the distal internal carotid diameter  as the denominator. RADIATION DOSE REDUCTION: This exam was performed according to the departmental dose-optimization program which includes automated exposure control, adjustment of the mA and/or kV according to patient size and/or use of iterative reconstruction technique. CONTRAST:  23mL OMNIPAQUE IOHEXOL 350 MG/ML SOLN COMPARISON:  CT from 12/12/2021. FINDINGS: CTA NECK FINDINGS Aortic arch: Visualized aortic arch normal caliber with standard 3 vessel morphology. No stenosis about the origin of the great vessels. Right carotid system: Right common and internal carotid arteries widely patent without stenosis, dissection or occlusion. Left carotid system: Left common and internal carotid arteries widely patent without stenosis, dissection or occlusion. Vertebral arteries: Both vertebral arteries arise from the subclavian arteries. No proximal subclavian artery stenosis. Both vertebral arteries widely patent without stenosis, dissection or occlusion. Skeleton: No discrete or worrisome osseous lesions. Mild multilevel cervical spondylosis. Other neck: No other acute soft tissue abnormality within the neck. Upper chest: Visualized upper chest demonstrates no acute finding. Review of the MIP images confirms the above findings CTA HEAD FINDINGS Anterior circulation: Both internal carotid arteries widely patent to the termini without stenosis. A1 segments widely patent. Normal anterior communicating artery complex. Both anterior cerebral arteries widely patent to their distal aspects without stenosis. No M1 stenosis or occlusion. Normal MCA bifurcations. Distal MCA branches well perfused and symmetric. Posterior circulation: Both vertebral arteries  patent without stenosis. Right PICA patent. Left PICA not seen. Basilar patent without stenosis. Superior cerebellar and posterior cerebral arteries patent bilaterally. Venous sinuses: Patent allowing for timing the contrast bolus. Anatomic variants: None significant. No  aneurysm. No pathologic enhancement on delayed sequence. Review of the MIP images confirms the above findings IMPRESSION: Normal CTA of the head and neck. No large vessel occlusion, hemodynamically significant stenosis, or other acute vascular abnormality. Electronically Signed   By: Rise Mu M.D.   On: 12/13/2021 00:37   CT HEAD WO CONTRAST ( )  Result Date: 12/12/2021 CLINICAL DATA:  Head trauma, abnormal mental status (Age 37-64y). Headache, vision change EXAM: CT HEAD WITHOUT CONTRAST TECHNIQUE: Contiguous axial images were obtained from the base of the skull through the vertex without intravenous contrast. RADIATION DOSE REDUCTION: This exam was performed according to the departmental dose-optimization program which includes automated exposure control, adjustment of the mA and/or kV according to patient size and/or use of iterative reconstruction technique. COMPARISON:  None Available. FINDINGS: Brain: Normal anatomic configuration. Parenchymal volume loss is commensurate with the patient's age. Mild periventricular white matter changes are present likely reflecting the sequela of small vessel ischemia. No abnormal intra or extra-axial mass lesion or fluid collection. No abnormal mass effect or midline shift. No evidence of acute intracranial hemorrhage or infarct. Ventricular size is normal. Cerebellum unremarkable. Vascular: No asymmetric hyperdense vasculature at the skull base. Skull: Intact Sinuses/Orbits: Paranasal sinuses are clear. Orbits are unremarkable. Other: Mastoid air cells and middle ear cavities are clear. IMPRESSION: No acute intracranial abnormality. Mild senescent change. Electronically Signed   By: Helyn Numbers M.D.   On: 12/12/2021 19:27   DG Chest 2 View  Result Date: 12/12/2021 CLINICAL DATA:  Chest pressure. EXAM: CHEST - 2 VIEW COMPARISON:  Two-view chest x-ray 01/25/2017 FINDINGS: Heart size is normal. Pacing and defibrillator wires are stable. Lungs are clear.  No edema or effusion is present. The visualized soft tissues and bony thorax are unremarkable. IMPRESSION: No active cardiopulmonary disease. Electronically Signed   By: Marin Roberts M.D.   On: 12/12/2021 18:59    PHYSICAL EXAM He is alert and oriented. Follows commands, name objects and repeats. No aphasia or dysarthria. Smile is symmetric, tongue midline. Tracks, crosses midline, no visual deficits except blurred vision with some difficulty in reading small print.  Face is symmetric without weakness.  Symmetric strength in extremities. Uppers 5/5, lowers 5/5. No ataxia. No sensation deficits  ASSESSMENT/PLAN [name] is a 57 y.o. male with history of  hypertension, heart failure, HIV who presents with blurred vision and unsteadiness that started on awakening this morning.    Probable posterior circulation TIA  Etiology:  likely small vessel disease CT head No acute abnormality.   CTA head & neck Normal CTA of the head and neck. No large vessel occlusion, hemodynamically significant stenosis, or other acute vascular abnormality MRI  pending 2D Echo pending LDL 48 HgbA1c 5.0 VTE prophylaxis - Lovenox    Diet   Diet Heart Room service appropriate? Yes; Fluid consistency: Thin   No antithrombotic prior to admission, now on aspirin 81 mg daily and clopidogrel 75 mg daily. For 3 weeks then ASA alone Therapy recommendations:  pending Disposition:  pending  Hypertension Home meds:  hydralazine, coreg Stable Permissive hypertension (OK if < 220/120) but gradually normalize in 5-7 days Long-term BP goal normotensive  Hyperlipidemia Home meds:  crestor, resumed in hospital LDL 48, goal < 70 High intensity statin not indicated due to  below goal Continue statin at discharge  CHF Nonischemic cardiomyopathy  VT Lasix, Entresto and aldactone home meds, currently on hold  S/p AICD/PPM, on amio Echo pending   Diabetes type II Controlled Home meds:  Jardiance  HgbA1c  5.0, goal < 7.0 CBGs SSI  Other Stroke Risk Factors Obesity, Body mass index is 30.55 kg/m., BMI >/= 30 associated with increased stroke risk, recommend weight loss, diet and exercise as appropriate Congestive heart failure  Other Active Problems HIV  gout  Hospital day # 0  Beulah Gandy DNP, ACNPC-AG  STROKE MD NOTE :  I have personally obtained history,examined this patient, reviewed notes, independently viewed imaging studies, participated in medical decision making and plan of care.ROS completed by me personally and pertinent positives fully documented  I have made any additions or clarifications directly to the above note. Agree with note above.  He presented with sudden onset of bilateral blurred vision as well as some dizziness and gait ataxia and appears to be improving CT scan neck unremarkable MRI will have to be delayed because he has a defibrillator/pacemaker.  Recommend continue ongoing stroke work-up.  Mobilize out of bed as tolerated.  Therapy consults.  Aspirin and Plavix for 3 weeks followed by aspirin alone and aggressive risk factor modification.  Greater than 50% time during this 50-minute visit was spent in counseling and coordination of care about his TIA and discussion about evaluation and treatment and answering questions.  Antony Contras, MD Medical Director [facility] Pager: [phone] 12/13/2021 2:34 PM  To contact Stroke Continuity provider, please refer to [email or web address] After hours, contact General Neurology

## 2021-12-13 NOTE — Evaluation (Signed)
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   Blurred vision 12/12/2021   Encounter for screening examination for sexually transmitted disease 05/05/2021   Traumatic arthritis of right ankle    Acute idiopathic gout involving toe of right foot    Septic arthritis of right ankle (HCC) 03/26/2021   Mixed hyperlipidemia 11/26/2019   Cirrhosis (HCC) 05/15/2019   Neurodermatitis 09/30/2017   Nonischemic cardiomyopathy (HCC)    HIV (human immunodeficiency virus infection) (HCC) 01/25/2017   Insomnia 06/26/2014   PTSD (post-traumatic stress disorder) 02/03/2014   ICD (implantable cardioverter-defibrillator), biventricular, in situ 01/23/2014   Paroxysmal ventricular fibrillation (HCC) 01/19/2014   Protein-calorie malnutrition, severe (HCC) 02/13/2013   GERD (gastroesophageal reflux disease) 08/19/2012   Chronic systolic heart failure (HCC) 01/25/2012   Alcohol abuse 11/30/2011   Genital herpes 06/09/2006   Chronic hepatitis C without hepatic coma (HCC) 06/09/2006   Primary hypertension 06/09/2006   Past Medical History:  Past Medical History:  Diagnosis Date   Chronic systolic heart failure (HCC)    a. Echo 12/05/11:  EF 20-25%, diff HK with mild sparing of IL wall, mild AI, mod MR, mild LAE, mild RVE, mild reduced RVF.;  b.  Echo 05/11/2012:  Mild LVH, EF 20-25%, Gr 1 diast dysfn, mod MR, mild LAE   Depression    G6PD deficiency    GERD (gastroesophageal reflux disease)    Hepatitis    Hep B and Hep C (patient does not report this but these are listed in previous notes.)   HIV infection (HCC)    Hypertension    NICM (nonischemic cardiomyopathy) (HCC)    cardia CTA 8/13 negative for obstructive CAD   Presence of permanent cardiac  pacemaker    VT (ventricular tachycardia) (HCC)    Past Surgical History:  Past Surgical History:  Procedure Laterality Date   BI-VENTRICULAR PACEMAKER INSERTION N/A 06/26/2013   Procedure: BI-VENTRICULAR PACEMAKER INSERTION (CRT-P);  Surgeon: Marinus Maw, MD;  Location[facility] CATH LAB;  Service: Cardiovascular;  Laterality: N/A;   BIV ICD GENERATOR CHANGEOUT N/A 01/24/2021   Procedure: BIV ICD GENERATOR CHANGEOUT;  Surgeon: Marinus Maw, MD;  Location: [facility] INVASIVE CV LAB;  Service: Cardiovascular;  Laterality: N/A;   COLONOSCOPY WITH PROPOFOL N/A 08/14/2016   Procedure: COLONOSCOPY WITH PROPOFOL;  Surgeon: Jeani Hawking, MD;  Location: [facility] ENDOSCOPY;  Service: Endoscopy;  Laterality: N/A;   ELECTROPHYSIOLOGY STUDY N/A 02/19/2012   Procedure: ELECTROPHYSIOLOGY STUDY;  Surgeon: Marinus Maw, MD;  Location: [facility] CATH LAB;  Service: Cardiovascular;  Laterality: N/A;   ESOPHAGOGASTRODUODENOSCOPY  12/07/2011   Procedure: ESOPHAGOGASTRODUODENOSCOPY (EGD);  Surgeon: [name], MD,FACG;  Location: [facility] ENDOSCOPY;  Service: Endoscopy;  Laterality: N/A;   FINGER SURGERY     Thumb laceration.     HARDWARE REMOVAL Right 07/01/2018   Procedure: HARDWARE REMOVAL;  Surgeon: Sheral Apley, MD;  Location: [facility];  Service: Orthopedics;  Laterality: Right;   INCISION AND DRAINAGE Right 07/01/2018   Procedure: INCISION AND DRAINAGE;  Surgeon: Sheral Apley, MD;  Location:  SURGERY CENTER;  Service: Orthopedics;  Laterality: Right;   LEFT HEART CATHETERIZATION WITH CORONARY ANGIOGRAM N/A 01/31/2014   Procedure: LEFT HEART CATHETERIZATION WITH CORONARY ANGIOGRAM;  Surgeon: Iran Ouch, MD;  Location: [facility] CATH LAB;  Service: Cardiovascular;  Laterality: N/A;   ORIF ANKLE FRACTURE Right 01/26/2017   Procedure: OPEN REDUCTION INTERNAL FIXATION (ORIF) ANKLE FRACTURE;  Surgeon: Sheral Apley, MD;  Location: [facility] OR;  Service: Orthopedics;  Laterality: Right;   HPI:  [name] is a 56 y.o. male who presented with acute onset blurred vision. CT negative; MRI pending. Prior medical history includes HIV, chronic systolic heart failure, and non-ischemic cardiomyopathy s/p ICD implantation.   Assessment / Plan / Recommendation Clinical Impression  Pt participated in cognitive/language assessment, demonstrating fluent, clear speech; excellent comprehension and expression of language; normal memory, problem solving, and attention. There are no communication needs identified. No f/u recommended. Our service will sign off.    SLP Assessment  SLP Recommendation/Assessment: Patient does not need any further Speech Lanaguage Pathology Services    Recommendations for follow up therapy are one component of a multi-disciplinary discharge planning process, led by the attending physician.  Recommendations may be updated based on patient status, additional functional criteria and insurance authorization.    Follow Up Recommendations  No SLP follow up                       SLP Evaluation Cognition  Arousal/Alertness: Awake/alert Orientation Level: Oriented X4 Attention: Alternating Alternating Attention: Appears intact Memory: Appears intact Awareness: Appears intact Problem Solving: Appears intact Safety/Judgment: Appears intact       Comprehension  Auditory Comprehension Overall Auditory Comprehension: Appears within functional limits for tasks assessed Yes/No Questions: Within Functional Limits Commands: Within Functional Limits Conversation: Complex Visual Recognition/Discrimination Discrimination: Within Function Limits Reading Comprehension Reading Status: Not tested    Expression Expression Primary Mode of Expression: Verbal Verbal Expression Overall Verbal Expression: Appears within functional limits for tasks assessed Written Expression Written Expression: Not tested   Oral / Motor  Oral Motor/Sensory Function Overall Oral Motor/Sensory  Function: Within functional limits Motor Speech Overall Motor Speech: Appears within functional limits for tasks assessed            Blenda Mounts Laurice 12/13/2021, 12:39 PM Marchelle Folks L. Samson Frederic, MA CCC/SLP Clinical Specialist - Acute Care SLP Acute Rehabilitation Services Office number [phone]

## 2021-12-13 NOTE — Progress Notes (Signed)
Daily Progress Note Intern Pager: [phone]  Patient name: GLENROY CROSSEN Medical record number: [id number] Date of birth: 02-Feb-1966 Age: 56 y.o. Gender: male  Primary Care Provider: Bess Kinds, MD Consultants: Neurology Code Status: FULL  Pt Overview and Major Events to Date:  KHARSON RASMUSSON is a 56 y.o. male with pertinent medical history including, HIV, chronic systolic heart failure, and non-ischemic cardiomyopathy s/p ICD implantation presenting with acute onset blurred vision. Neurology was consulted in ED. Patient was admitted to [facility] Medicine Teaching service for stroke evaluation on 12/12/21.    Assessment and Plan:  * Blurred vision Concern for stroke. Vitals signs stable. No focal deficits on neuro exam. CT Head w/o contrast negative. CTA Head/Neck without acute findings. Patient took home blood pressure medicine that he brought from home while in the ED- education provided on allowing for permissive HTN. Received Aspirin 325mg  and Plavix 300mg  in ED. Patient passed bedside swallow test in the ED.  -Admit to FPTS, progressive, attending Dr.  -Neurology consulted in ED, per their recs:  -Aspirin 81mg  daily  -Plavix 75mg  daily  -MR Brain WO contrast  -ECHO  -PT/OT/SLP eval  -q4 Neuro checks  -Discuss with Neurology utility of Optho eval -Permissive hypertension -Increase Crestor to 20mg  daily -Up with assistance -Lovenox for VTE ppx   HIV (human immunodeficiency virus infection) (HCC) Continue home medications. HIV-1 RNA quant undetectable 11/04/21 -Tivicay -Odefsey  Chronic systolic heart failure (HCC) Patient has Medtronic CRT-D biventricular pacemaker and defibrillator due to non-ischemic cardiomyopathy. EF 20-25% in 2014. ECHO 03/19/22 EF 55-60%. No new EKG changes from previous.  -Hold home blood pressure medications for permissive hypertension in the setting of stroke -Hold Entresto, Spirolactone, Hydralazine -Continue Jardiance -Restart  home Coreg -Follow-up ECHO       FEN/GI: Heart healthy PPx: Lovenox Dispo: Pending clinical evaluation of stroke  Subjective:  No acute events since admission. Denies worsening or improvement of blurry vision. No new issues. Denies chest pain, SOB, N/V.    Objective: Temp:  [97.8 F (36.6 C)-99.1 F (37.3 C)] 98.2 F (36.8 C) (07/15 0827) Pulse Rate:  [62-79] 67 (07/15 0827) Resp:  [13-22] 18 (07/15 0827) BP: (96-138)/(74-95) 115/77 (07/15 0827) SpO2:  [95 %-98 %] 95 % (07/15 0827) Weight:  [80.7 kg] 80.7 kg (07/14 1807) Physical Exam: General: NAD Neuro: A&O, no focal deficits Cardiovascular: RRR, no murmurs, no peripheral edema Respiratory: normal WOB on RA, CTAB, no wheezes, ronchi or rales Abdomen: soft, NTTP, no rebound or guarding Extremities: Moving all 4 extremities equally   Laboratory: Most recent CBC Lab Results  Component Value Date   WBC 9.4 12/12/2021   HGB 13.9 12/12/2021   HCT 42.2 12/12/2021   MCV 91.9 12/12/2021   PLT 187 12/12/2021   Most recent BMP    Latest Ref Rng & Units 12/12/2021    6:36 PM  BMP  Glucose 70 - 99 mg/dL 95   BUN 6 - 20 mg/dL 18   Creatinine 12/14/2021 - 1.24 mg/dL 12/14/2021   Sodium 12/14/2021 - 12/14/2021 mmol/L 141   Potassium 3.5 - 5.1 mmol/L 3.9   Chloride 98 - 111 mmol/L 111   CO2 22 - 32 mmol/L 18   Calcium 8.9 - 10.3 mg/dL 9.7     Other pertinent labs, TSH - 4.054, A1c - 5.0, LDL - 48  Imaging/Diagnostic Tests:  CTA Head/Neck 12/13/21 IMPRESSION: Normal CTA of the head and neck. No large vessel occlusion, hemodynamically significant stenosis, or other acute vascular  abnormality.  Celine Mans, MD 12/13/2021, 8:28 AM  PGY-1, [facility] Family Medicine FPTS Intern pager: [phone], text pages welcome Secure chat group [facility] Teaching Service

## 2021-12-13 NOTE — Assessment & Plan Note (Addendum)
Concern for stroke or TIA. Has persistent blurry visiontoday.  - passed swallow study  -Neurology consulted in ED, per their recs:  -Aspirin 81mg  daily  -Plavix 75mg  daily  -MR Brain WO contrast which will be completed Monday   -ECHO  -PT/OT/SLP eval  -q4 Neuro checks  -Increase Crestor to 20mg  daily -Up with assistance -Lovenox for VTE ppx

## 2021-12-13 NOTE — ED Notes (Signed)
Patient transported to CT 

## 2021-12-13 NOTE — Evaluation (Signed)
Occupational Therapy Evaluation Patient Details Name: [name] MRN: [id number] DOB: 1965/07/27 Today's Date: 12/13/2021   History of Present Illness Pt is a 56 yr old male who presented due to blurred vision and dizziness. Ct of neck and head did not find any acute findings. MRI  pending. PMH: HIV, chronic systolic heart faluire, pacemaker, gout, HLD, HEP C   Clinical Impression   Pt at PLOF lives alone but has an aide 2-3 hours every day to assist as needed. Pt in session was able to complete UE ADLS post set up, LE ADLS with min guard due to dizziness and sit to stand transfers with min guard. Pt started to ambulate with RW but reported increase in dizziness and needing increase in WB through BUE and then required to go into a sitting position. However, reported the following BP readings in session: Sitting: 120/91 standing 108/90 post ambulation 122/88 and supine 127/88. Pt currently with functional limitations due to the deficits listed below (see OT Problem List).  Pt will benefit from skilled OT to increase their safety and independence with ADL and functional mobility for ADL to facilitate discharge to venue listed below.        Recommendations for follow up therapy are one component of a multi-disciplinary discharge planning process, led by the attending physician.  Recommendations may be updated based on patient status, additional functional criteria and insurance authorization.   Follow Up Recommendations  Home health OT (may need no further depending on progress)    Assistance Recommended at Discharge Intermittent Supervision/Assistance  Patient can return home with the following A little help with bathing/dressing/bathroom;Assistance with cooking/housework;Assist for transportation    Functional Status Assessment  Patient has had a recent decline in their functional status and demonstrates the ability to make significant improvements in function in a reasonable and  predictable amount of time.  Equipment Recommendations  None recommended by OT    Recommendations for Other Services       Precautions / Restrictions Precautions Precautions: Fall Restrictions Weight Bearing Restrictions: No      Mobility Bed Mobility Overal bed mobility: Independent                  Transfers Overall transfer level: Needs assistance Equipment used: Rolling walker (2 wheels) Transfers: Sit to/from Stand Sit to Stand: Supervision           General transfer comment: Pt reported feeling increase in dizziness with positonal changes. Sitting: 120/91 standing 108/90 post ambulation 122/88 supine 127/88      Balance Overall balance assessment: Needs assistance Sitting-balance support: Feet supported Sitting balance-Leahy Scale: Good     Standing balance support: Bilateral upper extremity supported, No upper extremity supported Standing balance-Leahy Scale: Fair Standing balance comment: Pt noted when they started to feel dizzy needed BUE support with ambulation                           ADL either performed or assessed with clinical judgement   ADL Overall ADL's : Needs assistance/impaired Eating/Feeding: Sitting;Modified independent   Grooming: Wash/dry hands;Wash/dry face;Set up;Sitting   Upper Body Bathing: Set up;Sitting   Lower Body Bathing: Minimal assistance;Cueing for safety;Cueing for sequencing;Sit to/from stand   Upper Body Dressing : Set up;Sitting   Lower Body Dressing: Min guard;Cueing for safety;Cueing for sequencing;Sit to/from stand   Toilet Transfer: Min guard;Cueing for safety;Cueing for sequencing;Rolling walker (2 wheels)   Toileting- Architect and Hygiene: Min guard;Cueing  for safety;Cueing for sequencing;Sit to/from stand       Functional mobility during ADLs: Min guard;Rolling walker (2 wheels)       Vision Patient Visual Report: Blurring of vision Vision Assessment?: Vision impaired-  to be further tested in functional context     Perception     Praxis      Pertinent Vitals/Pain Pain Assessment Pain Assessment: No/denies pain     Hand Dominance Left   Extremity/Trunk Assessment Upper Extremity Assessment Upper Extremity Assessment: Overall WFL for tasks assessed   Lower Extremity Assessment Lower Extremity Assessment: Defer to PT evaluation       Communication Communication Communication: No difficulties   Cognition Arousal/Alertness: Awake/alert Behavior During Therapy: WFL for tasks assessed/performed Overall Cognitive Status: Within Functional Limits for tasks assessed                                       General Comments       Exercises     Shoulder Instructions      Home Living Family/patient expects to be discharged to:: Private residence Living Arrangements: Alone Available Help at Discharge: Family;Friend(s) Type of Home: Other(Comment) (aide every day for 3 hours) Home Access: Stairs to enter Entrance Stairs-Number of Steps: 4 Entrance Stairs-Rails: Right Home Layout: One level     Bathroom Shower/Tub: Chief Strategy Officer: Standard     Home Equipment: Teacher, English as a foreign language (2 wheels);Wheelchair - manual;Cane - single point   Additional Comments: Aide comes 7 days/week 3 hours  Lives With: Alone;Other (Comment) [name], Personal Care Asst)    Prior Functioning/Environment Prior Level of Function : Needs assist             Mobility Comments: Pt reports normally he is independent. ADLs Comments: Pt reports aide assists with ADL and iADL tasks.        OT Problem List: Decreased activity tolerance;Decreased safety awareness;Decreased knowledge of use of DME or AE      OT Treatment/Interventions: Neuromuscular education;Therapeutic activities;Visual/perceptual remediation/compensation;Patient/family education;Balance training    OT Goals(Current goals can be found in the  care plan section) Acute Rehab OT Goals Patient Stated Goal: to have vision return OT Goal Formulation: With patient Time For Goal Achievement: 12/27/21 Potential to Achieve Goals: Good ADL Goals Pt Will Transfer to Toilet: with modified independence;ambulating Additional ADL Goal #1: Pt will be able to complete ADLS/IADLS in stadning for 15 mins to be able to return home  OT Frequency: Min 2X/week    Co-evaluation              AM-PAC OT "6 Clicks" Daily Activity     Outcome Measure Help from another person eating meals?: None Help from another person taking care of personal grooming?: None Help from another person toileting, which includes using toliet, bedpan, or urinal?: A Little Help from another person bathing (including washing, rinsing, drying)?: A Little Help from another person to put on and taking off regular upper body clothing?: None Help from another person to put on and taking off regular lower body clothing?: A Little 6 Click Score: 21   End of Session Equipment Utilized During Treatment: Gait belt;Rolling walker (2 wheels) Nurse Communication: Mobility status (bp/dizziness)  Activity Tolerance: Patient tolerated treatment well Patient left: in bed;with call bell/phone within reach;with bed alarm set;with family/visitor present  OT Visit Diagnosis: Unsteadiness on feet (R26.81);Low vision, both eyes (H54.2)  [id number] OT Time Calculation (min): 18 min Charges:  OT General Charges $OT Visit: 1 Visit OT Evaluation $OT Eval Low Complexity: [place] OTR/L  Acute Rehab Services  [phone] office number [phone] pager number   Joeseph Amor 12/13/2021, 1:22 PM

## 2021-12-13 NOTE — Evaluation (Signed)
Physical Therapy Evaluation Patient Details Name: [name] MRN: [id number] DOB: 03/28/1966 Today's Date: 12/13/2021  History of Present Illness  Pt is a 56 yr old male who presented due to blurred vision and dizziness. Ct of neck and head did not find any acute findings. MRI pending. PMH: HIV, chronic systolic heart faluire, pacemaker, gout, HLD, HEP C  Clinical Impression  Pt able to amb with walker in the hallway with one very brief report of feeling a little woozy which subsided with brief standing rest break. Feel pt is very close to his baseline. Will follow acute but doubt pt will need PT after dc.        Recommendations for follow up therapy are one component of a multi-disciplinary discharge planning process, led by the attending physician.  Recommendations may be updated based on patient status, additional functional criteria and insurance authorization.  Follow Up Recommendations No PT follow up      Assistance Recommended at Discharge PRN  Patient can return home with the following  Help with stairs or ramp for entrance    Equipment Recommendations None recommended by PT  Recommendations for Other Services       Functional Status Assessment Patient has had a recent decline in their functional status and demonstrates the ability to make significant improvements in function in a reasonable and predictable amount of time.     Precautions / Restrictions Precautions Precautions: Fall Restrictions Weight Bearing Restrictions: No      Mobility  Bed Mobility Overal bed mobility: Independent                  Transfers Overall transfer level: Needs assistance Equipment used: Rolling walker (2 wheels) Transfers: Sit to/from Stand Sit to Stand: Supervision           General transfer comment: supervision for safety    Ambulation/Gait Ambulation/Gait assistance: Supervision Gait Distance (Feet): 225 Feet Assistive device: Rolling walker (2  wheels) Gait Pattern/deviations: Step-through pattern, Decreased stride length Gait velocity: decr Gait velocity interpretation: 1.31 - 2.62 ft/sec, indicative of limited community ambulator   General Gait Details: Assist for safety. One report of feeling a little woozy but after brief (5 sec) standing break it subsided.  Stairs            Wheelchair Mobility    Modified Rankin (Stroke Patients Only)       Balance Overall balance assessment: Needs assistance Sitting-balance support: Feet supported Sitting balance-Leahy Scale: Good     Standing balance support: Bilateral upper extremity supported, No upper extremity supported Standing balance-Leahy Scale: Fair                               Pertinent Vitals/Pain Pain Assessment Pain Assessment: No/denies pain    Home Living Family/patient expects to be discharged to:: Private residence Living Arrangements: Alone Available Help at Discharge: Family;Friend(s) Type of Home: Other(Comment) (aide every day for 3 hours) Home Access: Stairs to enter Entrance Stairs-Rails: Right Entrance Stairs-Number of Steps: 4   Home Layout: One level Home Equipment: BSC/3in1;Rolling Walker (2 wheels);Wheelchair - manual;Cane - single point Additional Comments: Aide comes 7 days/week 3 hours    Prior Function Prior Level of Function : Needs assist             Mobility Comments: Pt reports normally he is independent. ADLs Comments: Pt reports aide assists with ADL and iADL tasks.     Hand Dominance  Dominant Hand: Left    Extremity/Trunk Assessment   Upper Extremity Assessment Upper Extremity Assessment: Defer to OT evaluation    Lower Extremity Assessment Lower Extremity Assessment: Generalized weakness       Communication   Communication: No difficulties  Cognition Arousal/Alertness: Awake/alert Behavior During Therapy: WFL for tasks assessed/performed Overall Cognitive Status: Within Functional  Limits for tasks assessed                                          General Comments      Exercises     Assessment/Plan    PT Assessment Patient needs continued PT services  PT Problem List Decreased strength;Decreased balance;Decreased mobility       PT Treatment Interventions DME instruction;Gait training;Stair training;Functional mobility training;Therapeutic activities;Therapeutic exercise;Balance training;Patient/family education    PT Goals (Current goals can be found in the Care Plan section)  Acute Rehab PT Goals Patient Stated Goal: return home PT Goal Formulation: With patient Time For Goal Achievement: 12/27/21 Potential to Achieve Goals: Good    Frequency Min 3X/week     Co-evaluation               AM-PAC PT "6 Clicks" Mobility  Outcome Measure Help needed turning from your back to your side while in a flat bed without using bedrails?: None Help needed moving from lying on your back to sitting on the side of a flat bed without using bedrails?: None Help needed moving to and from a bed to a chair (including a wheelchair)?: A Little Help needed standing up from a chair using your arms (e.g., wheelchair or bedside chair)?: A Little Help needed to walk in hospital room?: A Little Help needed climbing 3-5 steps with a railing? : A Little 6 Click Score: 20    End of Session Equipment Utilized During Treatment: Gait belt Activity Tolerance: Patient tolerated treatment well Patient left: in bed;with call bell/phone within reach;with bed alarm set   PT Visit Diagnosis: Other abnormalities of gait and mobility (R26.89);Dizziness and giddiness (R42)    Time: 0175-1025 PT Time Calculation (min) (ACUTE ONLY): 10 min   Charges:   PT Evaluation $PT Eval Low Complexity: 1 Low          [facility] PT Acute Rehabilitation Services Office [phone]   Angelina [facility] 12/13/2021, 3:37 PM

## 2021-12-13 NOTE — Care Management Obs Status (Signed)
MEDICARE OBSERVATION STATUS NOTIFICATION   Patient Details  Name: [name] MRN: [id number] Date of Birth: 1965-06-16   Medicare Observation Status Notification Given:  Yes    Lawerance Sabal, RN 12/13/2021, 3:24 PM

## 2021-12-13 NOTE — Progress Notes (Signed)
 ***  date/time  Daily Progress Note Intern Pager: [phone]  Patient name: [name] Medical record number: [id number] Date of birth: 01-05-1966 Age: 56 y.o. Gender: male  Primary Care Provider: Bess Kinds, MD Consultants: Neurology Code Status: Full  Pt Overview and Major Events to Date:  ***  Assessment and Plan:  ***. Pertinent PMH/PSH includes ***.  * Blurred vision Concern for stroke or TIA. Has persistent blurry visiontoday.  - passed swallow study  -Neurology consulted in ED, per their recs:  -Aspirin 81mg  daily  -Plavix 75mg  daily  -MR Brain WO contrast which will be completed Monday   -ECHO  -PT/OT/SLP eval  -q4 Neuro checks  -Increase Crestor to 20mg  daily -Up with assistance -Lovenox for VTE ppx   HIV (human immunodeficiency virus infection) (HCC) Continue home medications. HIV-1 RNA quant undetectable 11/04/21 -Tiv[place]ay -Odefsey  ICD (implantable cardioverter-defibrillator), biventricular, in situ Will plan for MRI Monday as unable to obtain over weekend.   Chronic systolic heart failure [facility]) Patient has Medtronic CRT-D biventricular pacemaker and defibrillator due to non-ischemic cardiomyopathy. EF 20-25% in 2014. ECHO 03/19/22 EF 55-60%. No new EKG changes from previous.  -Hold home blood pressure medications for permissive hypertension in the setting of stroke -Hold Entresto, Spirolactone, Hydralazine -Continue Jardiance -Restart home Coreg -Follow-up ECHO   FEN/GI: Heart healthy PPx: Lovenox Dispo:Home with home health pending MRI on Monday 7/16 (patient has has ICD)  Subjective:  ***  Objective: Temp:  [97.8 F (36.6 C)-98.4 F (36.9 C)] 98.3 F (36.8 C) (07/15 1945) Pulse Rate:  [62-75] 67 (07/15 1945) Resp:  [13-22] 14 (07/15 1945) BP: (96-138)/(74-93) 122/88 (07/15 1945) SpO2:  [95 %-98 %] 97 % (07/15 1945) Physical Exam: General: *** Cardiovascular: *** Respiratory: *** Abdomen: *** Extremities:  ***  Laboratory: Most recent CBC Lab Results  Component Value Date   WBC 9.4 12/12/2021   HGB 13.9 12/12/2021   HCT 42.2 12/12/2021   MCV 91.9 12/12/2021   PLT 187 12/12/2021   Most recent BMP    Latest Ref Rng & Units 12/12/2021    6:36 PM  BMP  Glucose 70 - 99 mg/dL 95   BUN 6 - 20 mg/dL 18   Creatinine 12/14/2021 - 1.24 mg/dL 12/14/2021   Sodium 12/14/2021 - 9.37 mmol/L 141   Potassium 3.5 - 5.1 mmol/L 3.9   Chloride 98 - 111 mmol/L 111   CO2 22 - 32 mmol/L 18   Calcium 8.9 - 10.3 mg/dL 9.7     Other pertinent labs ***   Imaging/Diagnostic Tests:   Radiologist Impression: *** My interpretation: 9.02, MD 12/13/2021, 8:54 PM  PGY-2, [facility] FPTS Intern pager: [phone], text pages welcome Secure chat group [facility] Teaching Service

## 2021-12-13 NOTE — Assessment & Plan Note (Signed)
Will plan for MRI Tuesday 7/18 as unable to obtain over weekend

## 2021-12-13 NOTE — Progress Notes (Signed)
  Echocardiogram 2D Echocardiogram has been performed.  [name] 12/13/2021, 3:10 PM 

## 2021-12-13 NOTE — Hospital Course (Signed)
[name] is a 56 y.o. male who was admitted to [facility] for acute onset blurred vision and dizziness concerning for ischemic CVA.  His hospital course is also by problem, refer to the H&P for additional information.  CVA Work Up In the ED vitals are stable and CBC, CMP, ethanol, lipase, troponin, ammonia unremarkable.  CT head unremarkable for any acute changes.  Neurology was consulted and recommended admission for stroke work-up.  CTA head and neck negative.  Patient was loaded with aspirin and Plavix and continued on, recommendation to continue DAPT for *** weeks/months.  Restratification labs notable for***.  Echocardiogram obtained and demonstrated***.  There was a delay in obtaining MRI of his brain due to his pacemaker which required interrogation. MRI brain obtained on HOD #3*** and showed EF 50-55%, low normal LV function and mild to moderate tricuspid regurgitation. PT/OT/SLP consulted and recommended***. His statin was increased to 20 mg for high-intensity dosage.    Other conditions chronic and stable.  Issues for PCP follow up:

## 2021-12-14 DIAGNOSIS — I5022 Chronic systolic (congestive) heart failure: Secondary | ICD-10-CM | POA: Diagnosis not present

## 2021-12-14 DIAGNOSIS — H538 Other visual disturbances: Secondary | ICD-10-CM | POA: Diagnosis not present

## 2021-12-14 DIAGNOSIS — B20 Human immunodeficiency virus [HIV] disease: Secondary | ICD-10-CM | POA: Diagnosis not present

## 2021-12-14 MED ORDER — FUROSEMIDE 40 MG PO TABS
40.0000 mg | ORAL_TABLET | Freq: Once | ORAL | Status: AC
  Administered 2021-12-14: 40 mg via ORAL
  Filled 2021-12-14: qty 1

## 2021-12-14 MED ORDER — COLCHICINE 0.6 MG PO TABS
0.6000 mg | ORAL_TABLET | Freq: Every day | ORAL | Status: DC
  Administered 2021-12-14 – 2021-12-16 (×3): 0.6 mg via ORAL
  Filled 2021-12-14 (×3): qty 1

## 2021-12-14 MED ORDER — ONDANSETRON HCL 4 MG/2ML IJ SOLN
4.0000 mg | Freq: Once | INTRAMUSCULAR | Status: AC
  Administered 2021-12-14: 4 mg via INTRAVENOUS
  Filled 2021-12-14: qty 2

## 2021-12-14 NOTE — Progress Notes (Addendum)
STROKE TEAM PROGRESS NOTE   INTERVAL HISTORY He is awake and alert on his phone in NAD. No family at bedside. States blurry vision is better today. Exam is unchanged from yesterday. VSS. No new neurological events overnight. Waiting on MRI which will be done tomorrow.  Vitals:   12/13/21 1256 12/13/21 1945 12/14/21 0343 12/14/21 0820  BP: (!) 125/93 122/88 108/84 124/70  Pulse: 71 67 67 65  Resp: 18 14 15 20   Temp: 98.4 F (36.9 C) 98.3 F (36.8 C) 98.6 F (37 C) 98.3 F (36.8 C)  TempSrc: Oral Oral  Oral  SpO2: 97% 97% 98% 99%  Weight:      Height:       CBC:  Recent Labs  Lab 12/12/21 1836  WBC 9.4  NEUTROABS 6.1  HGB 13.9  HCT 42.2  MCV 91.9  PLT 187   Basic Metabolic Panel:  Recent Labs  Lab 12/12/21 1836  NA 141  K 3.9  CL 111  CO2 18*  GLUCOSE 95  BUN 18  CREATININE 1.17  CALCIUM 9.7   Lipid Panel:  Recent Labs  Lab 12/13/21 0316  CHOL 137  TRIG 103  HDL 68  CHOLHDL 2.0  VLDL 21  LDLCALC 48   HgbA1c:  Recent Labs  Lab 12/13/21 0316  HGBA1C 5.0   Urine Drug Screen: No results for input(s): "LABOPIA", "COCAINSCRNUR", "LABBENZ", "AMPHETMU", "THCU", "LABBARB" in the last 168 hours.  Alcohol Level  Recent Labs  Lab 12/12/21 1836  ETH <10    IMAGING past 24 hours ECHOCARDIOGRAM COMPLETE  Result Date: 12/13/2021    ECHOCARDIOGRAM REPORT   Patient Name:   [name] Date of Exam: 12/13/2021 Medical Rec #:  [id number]        Height:       64.0 in Accession #:    [id number]       Weight:       178.0 lb Date of Birth:  1966-05-04        BSA:          1.862 m Patient Age:    56 years         BP:           125/93 mmHg Patient Gender: M                HR:           65 bpm. Exam Location:  Inpatient Procedure: 2D Echo, Color Doppler and Cardiac Doppler Indications:    Stroke  History:        Patient has prior history of Echocardiogram examinations, most                 recent 03/19/2020. Heart failure, Pacemaker and Defibrillator;                  Signs/Symptoms:Dizziness/Lightheadedness and Blurry vision.  Sonographer:    03/21/2020 RCS Referring Phys: [id number] CARINA M BROWN IMPRESSIONS  1. Left ventricular ejection fraction, by estimation, is 50 to 55%. The left ventricle has low normal function. The left ventricle has no regional wall motion abnormalities. Left ventricular diastolic parameters were normal.  2. Right ventricular systolic function is normal. The right ventricular size is normal. There is normal pulmonary artery systolic pressure.  3. A small pericardial effusion is present. The pericardial effusion is localized near the right atrium.  4. The mitral valve is normal in structure. Trivial mitral valve regurgitation. No evidence of mitral stenosis.  5.  Tricuspid valve regurgitation is mild to moderate.  6. The aortic valve is tricuspid. Aortic valve regurgitation is not visualized. No aortic stenosis is present.  7. The inferior vena cava is normal in size with <50% respiratory variability, suggesting right atrial pressure of 8 mmHg. Comparison(s): Prior images unable to be directly viewed, comparison made by report only. Conclusion(s)/Recommendation(s): Otherwise normal echocardiogram, with minor abnormalities described in the report. FINDINGS  Left Ventricle: Left ventricular ejection fraction, by estimation, is 50 to 55%. The left ventricle has low normal function. The left ventricle has no regional wall motion abnormalities. The left ventricular internal cavity size was normal in size. There is no left ventricular hypertrophy. Abnormal (paradoxical) septal motion, consistent with RV pacemaker. Left ventricular diastolic parameters were normal. Right Ventricle: The right ventricular size is normal. Right vetricular wall thickness was not well visualized. Right ventricular systolic function is normal. There is normal pulmonary artery systolic pressure. The tricuspid regurgitant velocity is 2.49 m/s, and with an assumed right atrial  pressure of 8 mmHg, the estimated right ventricular systolic pressure is 32.8 mmHg. Left Atrium: Left atrial size was normal in size. Right Atrium: Right atrial size was not well visualized. Pericardium: A small pericardial effusion is present. The pericardial effusion is localized near the right atrium. Mitral Valve: The mitral valve is normal in structure. There is mild thickening of the mitral valve leaflet(s). Trivial mitral valve regurgitation. No evidence of mitral valve stenosis. Tricuspid Valve: The tricuspid valve is normal in structure. Tricuspid valve regurgitation is mild to moderate. No evidence of tricuspid stenosis. Aortic Valve: The aortic valve is tricuspid. Aortic valve regurgitation is not visualized. No aortic stenosis is present. Aortic valve mean gradient measures 5.0 mmHg. Aortic valve peak gradient measures 9.0 mmHg. Aortic valve area, by VTI measures 2.32 cm. Pulmonic Valve: The pulmonic valve was not well visualized. Pulmonic valve regurgitation is trivial. No evidence of pulmonic stenosis. Aorta: The aortic root, ascending aorta, aortic arch and descending aorta are all structurally normal, with no evidence of dilitation or obstruction. Venous: The inferior vena cava is normal in size with less than 50% respiratory variability, suggesting right atrial pressure of 8 mmHg. IAS/Shunts: The atrial septum is grossly normal. Additional Comments: A device lead is visualized.  LEFT VENTRICLE PLAX 2D LVIDd:         5.20 cm     Diastology LVIDs:         4.20 cm     LV e' medial:    8.16 cm/s LV PW:         1.00 cm     LV E/e' medial:  6.8 LV IVS:        1.00 cm     LV e' lateral:   8.70 cm/s LVOT diam:     2.00 cm     LV E/e' lateral: 6.4 LV SV:         71 LV SV Index:   38 LVOT Area:     3.14 cm  LV Volumes (MOD) LV vol d, MOD A2C: 48.3 ml LV vol d, MOD A4C: 62.2 ml LV vol s, MOD A2C: 20.7 ml LV vol s, MOD A4C: 34.3 ml LV SV MOD A2C:     27.6 ml LV SV MOD A4C:     62.2 ml LV SV MOD BP:      28.8  ml RIGHT VENTRICLE             IVC RV Basal diam:  3.30 cm  IVC diam: 1.60 cm RV Mid diam:    3.20 cm RV S prime:     16.50 cm/s TAPSE (M-mode): 2.6 cm LEFT ATRIUM             Index        RIGHT ATRIUM           Index LA diam:        4.00 cm 2.15 cm/m   RA Area:     11.40 cm LA Vol (A2C):   15.7 ml 8.43 ml/m   RA Volume:   23.00 ml  12.35 ml/m LA Vol (A4C):   37.4 ml 20.09 ml/m LA Biplane Vol: 25.7 ml 13.80 ml/m  AORTIC VALVE                     PULMONIC VALVE AV Area (Vmax):    2.49 cm      PV Vmax:          0.92 m/s AV Area (Vmean):   2.40 cm      PV Peak grad:     3.4 mmHg AV Area (VTI):     2.32 cm      PR End Diast Vel: 7.08 msec AV Vmax:           150.00 cm/s AV Vmean:          100.000 cm/s AV VTI:            0.308 m AV Peak Grad:      9.0 mmHg AV Mean Grad:      5.0 mmHg LVOT Vmax:         119.00 cm/s LVOT Vmean:        76.250 cm/s LVOT VTI:          0.227 m LVOT/AV VTI ratio: 0.74  AORTA Ao Root diam: 3.30 cm Ao Asc diam:  3.50 cm MITRAL VALVE               TRICUSPID VALVE MV Area (PHT): 3.27 cm    TR Peak grad:   24.8 mmHg MV Decel Time: 232 msec    TR Vmax:        249.00 cm/s MV E velocity: 55.50 cm/s MV A velocity: 99.60 cm/s  SHUNTS MV E/A ratio:  0.56        Systemic VTI:  0.23 m                            Systemic Diam: 2.00 cm Jodelle Red MD Electronically signed by Jodelle Red MD Signature Date/Time: 12/13/2021/5:18:46 PM    Final     PHYSICAL EXAM He is alert and oriented. Follows commands, name objects and repeats. No aphasia or dysarthria. Smile is symmetric, tongue midline. Tracks, crosses midline, no visual deficits except blurred vision with some difficulty in reading small print.  Face is symmetric without weakness.  Symmetric strength in extremities. Uppers 5/5, lowers 5/5. No ataxia. No sensation deficits  ASSESSMENT/PLAN [name] is a 56 y.o. male with history of  hypertension, heart failure, HIV who presents with blurred vision and  unsteadiness that started on awakening this morning.    Probable posterior circulation TIA  Etiology:  likely small vessel disease CT head No acute abnormality.   CTA head & neck Normal CTA of the head and neck. No large vessel occlusion, hemodynamically significant stenosis, or other acute vascular abnormality MRI  pending 2D Echo 50-55%  LDL 48 HgbA1c 5.0 VTE prophylaxis - Lovenox    Diet   Diet Heart Room service appropriate? Yes; Fluid consistency: Thin   No antithrombotic prior to admission, now on aspirin 81 mg daily and clopidogrel 75 mg daily. For 3 weeks then ASA alone Therapy recommendations:  HH OT Disposition:  pending  Hypertension Home meds:  hydralazine, coreg Stable Permissive hypertension (OK if < 220/120) but gradually normalize in 5-7 days Long-term BP goal normotensive  Hyperlipidemia Home meds:  crestor, resumed in hospital LDL 48, goal < 70 High intensity statin not indicated due to below goal Continue statin at discharge  CHF Nonischemic cardiomyopathy  VT Lasix, Entresto and aldactone home meds, currently on hold  S/p AICD/PPM, on amio Echo pending   Diabetes type II Controlled Home meds:  Jardiance  HgbA1c 5.0, goal < 7.0 CBGs SSI  Other Stroke Risk Factors Obesity, Body mass index is 30.55 kg/m., BMI >/= 30 associated with increased stroke risk, recommend weight loss, diet and exercise as appropriate Congestive heart failure  Other Active Problems HIV  gout  Hospital day # 0  Gevena Mart DNP, ACNPC-AG  I have personally obtained history,examined this patient, reviewed notes, independently viewed imaging studies, participated in medical decision making and plan of care.ROS completed by me personally and pertinent positives fully documented  I have made any additions or clarifications directly to the above note. Agree with note above.  Await MRI to be done tomorrow  [name], MD Medical Director [facility] Stroke Center Pager:  [phone] 12/14/2021 1:34 PM  To contact Stroke Continuity provider, please refer to [email or web address]. After hours, contact General Neurology

## 2021-12-15 ENCOUNTER — Other Ambulatory Visit (HOSPITAL_COMMUNITY): Payer: Self-pay

## 2021-12-15 DIAGNOSIS — H538 Other visual disturbances: Secondary | ICD-10-CM | POA: Diagnosis not present

## 2021-12-15 LAB — BASIC METABOLIC PANEL
Anion gap: 12 (ref 5–15)
BUN: 19 mg/dL (ref 6–20)
CO2: 24 mmol/L (ref 22–32)
Calcium: 9.8 mg/dL (ref 8.9–10.3)
Chloride: 104 mmol/L (ref 98–111)
Creatinine, Ser: 1.33 mg/dL — ABNORMAL HIGH (ref 0.61–1.24)
GFR, Estimated: >60 mL/min (ref >60)
Glucose, Bld: 98 mg/dL (ref 70–99)
Potassium: 3.6 mmol/L (ref 3.5–5.1)
Sodium: 140 mmol/L (ref 135–145)

## 2021-12-15 MED ORDER — ROSUVASTATIN CALCIUM 20 MG PO TABS
20.0000 mg | ORAL_TABLET | Freq: Every day | ORAL | 1 refills | Status: DC
  Filled 2021-12-15: qty 30, 30d supply, fill #0

## 2021-12-15 MED ORDER — CLOPIDOGREL BISULFATE 75 MG PO TABS
75.0000 mg | ORAL_TABLET | Freq: Every day | ORAL | 0 refills | Status: DC
  Filled 2021-12-15: qty 16, 16d supply, fill #0

## 2021-12-15 MED ORDER — ACETAMINOPHEN 325 MG PO TABS: 650.0000 mg | ORAL_TABLET | ORAL | Status: DC | PRN

## 2021-12-15 MED ORDER — ASPIRIN 81 MG PO TBEC: 81.0000 mg | DELAYED_RELEASE_TABLET | Freq: Every day | ORAL | 12 refills | Status: DC

## 2021-12-15 NOTE — Progress Notes (Signed)
STROKE TEAM PROGRESS NOTE   INTERVAL HISTORY He is awake and sitting up in bed  in NAD. No family at bedside. States blurry vision is almost gone. Exam is unchanged from yesterday. VSS. No new neurological events overnight. Waiting on MRI which will be done t today at 2 PM.  Wants to go home after that Vitals:   12/14/21 1927 12/14/21 2330 12/15/21 0500 12/15/21 0722  BP: (!) 123/95 (!) 115/98 (!) 122/92 117/88  Pulse: 67 65 65 63  Resp: 16 17  16   Temp: 98.6 F (37 C) 97.9 F (36.6 C) 97.7 F (36.5 C) 98.2 F (36.8 C)  TempSrc: Oral  Oral Oral  SpO2: 97% 99% 98% 96%  Weight:      Height:       CBC:  Recent Labs  Lab 12/12/21 1836  WBC 9.4  NEUTROABS 6.1  HGB 13.9  HCT 42.2  MCV 91.9  PLT 187   Basic Metabolic Panel:  Recent Labs  Lab 12/12/21 1836 12/15/21 0452  NA 141 140  K 3.9 3.6  CL 111 104  CO2 18* 24  GLUCOSE 95 98  BUN 18 19  CREATININE 1.17 1.33*  CALCIUM 9.7 9.8   Lipid Panel:  Recent Labs  Lab 12/13/21 0316  CHOL 137  TRIG 103  HDL 68  CHOLHDL 2.0  VLDL 21  LDLCALC 48   HgbA1c:  Recent Labs  Lab 12/13/21 0316  HGBA1C 5.0   Urine Drug Screen: No results for input(s): "LABOPIA", "COCAINSCRNUR", "LABBENZ", "AMPHETMU", "THCU", "LABBARB" in the last 168 hours.  Alcohol Level  Recent Labs  Lab 12/12/21 1836  ETH <10    IMAGING past 24 hours No results found.  PHYSICAL EXAM He is alert and oriented. Follows commands, name objects and repeats. No aphasia or dysarthria. Smile is symmetric, tongue midline. Tracks, crosses midline, no visual deficits except blurred vision with some difficulty in reading small print.  Face is symmetric without weakness.  Symmetric strength in extremities. Uppers 5/5, lowers 5/5. No ataxia. No sensation deficits  ASSESSMENT/PLAN [name] is a 56 y.o. male with history of  hypertension, heart failure, HIV who presents with blurred vision and unsteadiness that started on awakening this morning.     Probable posterior circulation TIA  Etiology:  likely small vessel disease CT head No acute abnormality.   CTA head & neck Normal CTA of the head and neck. No large vessel occlusion, hemodynamically significant stenosis, or other acute vascular abnormality MRI  pending 2D Echo 50-55% LDL 48 HgbA1c 5.0 VTE prophylaxis - Lovenox    Diet   Diet Heart Room service appropriate? Yes; Fluid consistency: Thin   No antithrombotic prior to admission, now on aspirin 81 mg daily and clopidogrel 75 mg daily. For 3 weeks then ASA alone Therapy recommendations:  HH OT Disposition:  pending  Hypertension Home meds:  hydralazine, coreg Stable Permissive hypertension (OK if < 220/120) but gradually normalize in 5-7 days Long-term BP goal normotensive  Hyperlipidemia Home meds:  crestor, resumed in hospital LDL 48, goal < 70 High intensity statin not indicated due to below goal Continue statin at discharge  CHF Nonischemic cardiomyopathy  VT Lasix, Entresto and aldactone home meds, currently on hold  S/p AICD/PPM, on amio Echo pending   Diabetes type II Controlled Home meds:  Jardiance  HgbA1c 5.0, goal < 7.0 CBGs SSI  Other Stroke Risk Factors Obesity, Body mass index is 30.55 kg/m., BMI >/= 30 associated with increased stroke risk,  recommend weight loss, diet and exercise as appropriate Congestive heart failure  Other Active Problems HIV  gout  Hospital day # 0 Check MRI scan of the brain this afternoon and discharge home after that.  David Heady, MD Medical Director [facility] Stroke [facility] Pager: [phone] 12/15/2021 11:39 AM  To contact Stroke Continuity provider, please refer to [email or web address]. After hours, contact General Neurology

## 2021-12-15 NOTE — Discharge Instructions (Addendum)
Thank you for letting us care for you during your stay.  You were admitted to the [facility] Medicine Teaching Service.   You were admitted for blurry vision and work up of a stroke.  We found the following during your stay **   We recommend follow up specifically for blood pressure and heart medications.     Please follow up with your primary care physician in 1 week.   If your symptoms worsen or return, please return to the hospital.  Please let us know if you have questions about your stay at [facility].

## 2021-12-15 NOTE — TOC Transition Note (Signed)
Transition of Care [facility]) - CM/SW Discharge Note   Patient Details  Name: [name] MRN: [id number] Date of Birth: [birth year]  [facility] of [facility] CM/SW Contact:  Kermit Balo, RN Phone Number: 12/15/2021, 12:17 PM   Clinical Narrative:    Pt discharging home with outpatient therapy at [facility]. Orders in Epic and information on the AVS.  No DME needs.  Pt lives alone but has a caregiver 7 days a week for 3 hours a day. He provides the patient with transportation and helps to oversee his medications. Patient asking about other medical transportation. CM has provided on the AVS the number for DSS so he can call and get his medicaid transportation arranged. Pt is aware.  Pt states his caregiver will provide transport home at discharge.    Final next level of care: OP Rehab Barriers to Discharge: No Barriers Identified   Patient Goals and CMS Choice     Choice offered to / list presented to : Patient  Discharge Placement                       Discharge Plan and Services                                     Social Determinants of Health (SDOH) Interventions     Readmission Risk Interventions     No data to display

## 2021-12-15 NOTE — Progress Notes (Signed)
     Daily Progress Note Intern Pager: [phone]  Patient name: [name] Medical record number: [id number] Date of birth: 1965/12/31 Age: 56 y.o. Gender: male  Primary Care Provider: Bess Kinds, MD Consultants: Neurology Code Status: Full  Pt Overview and Major Events to Date:  7/14 admitted 7/15 plan for MRI Monday 7/17 d/t ICD and inability to get MRI with this over weekend  Assessment and Plan:  Braylin Xu is a 56 yo male admitted for acute onset blurry vision and concern for stroke or TIA. Currently awaiting MRI on 7/17 d/t pt having ICD. Pertinent PMH/PSH includes HIV controlled with medications, chronic systolic HF, cirrhosis, nonischemic CM s/p ICD. * Blurred vision Concern for stroke or TIA. Has persistent blurry vision. - passed swallow study  -Neurology consulted, per their recs:  -Aspirin 81mg  daily  -Plavix 75mg  daily  -MR Brain WO contrast which will be completed Monday 7/17  -q4 Neuro checks  -Crestor 20mg  daily -Up with assistance -Lovenox for VTE ppx   HIV (human immunodeficiency virus infection) (HCC) Continue home medications. HIV-1 RNA quant undetectable 11/04/21 -Tivicay -Odefsey  ICD (implantable cardioverter-defibrillator), biventricular, in situ Will plan for MRI Tuesday 7/18 as unable to obtain over weekend  Chronic systolic heart failure [facility]) Patient has Medtronic CRT-D biventricular pacemaker and defibrillator due to non-ischemic cardiomyopathy. EF 20-25% in 2014. ECHO 03/19/22 EF 55-60%. 12/14/21 echo EF 50 to 55%, low normal LV fn, mild to mod TR. No new EKG changes from previous.  -Holding home Entresto, Spirolactone, Hydralazine -Continue Jardiance -Continue home Coreg        FEN/GI: Heart healthy PPx: Lovenox Dispo: Home with home health, pending MRI brain  Subjective:  No acute events overnight.  Dates blurry vision has improved.  Denies chest pain shortness of breath nausea or vomiting   Objective: Temp:  [97.7  F (36.5 C)-98.6 F (37 C)] 98.3 F (36.8 C) (07/17 1303) Pulse Rate:  [63-71] 71 (07/17 1303) Resp:  [16-17] 16 (07/17 1303) BP: (115-128)/(87-98) 128/87 (07/17 1303) SpO2:  [96 %-99 %] 97 % (07/17 1303) Physical Exam: General: Well-appearing male sitting in hospital bed Neuro: Alert and oriented, no focal deficits, EOM Cardiovascular: Regular rate and rhythm no murmurs rubs or gallops Respiratory: Clear to auscultation bilaterally, normal work of breathing on room air Abdomen: Soft nontender to palpation, no rebound or guarding Extremities: No lower extremity edema bilaterally  Laboratory: Most recent CBC Lab Results  Component Value Date   WBC 9.4 12/12/2021   HGB 13.9 12/12/2021   HCT 42.2 12/12/2021   MCV 91.9 12/12/2021   PLT 187 12/12/2021   Most recent BMP    Latest Ref Rng & Units 12/15/2021    4:52 AM  BMP  Glucose 70 - 99 mg/dL 98   BUN 6 - 20 mg/dL 19   Creatinine 12/14/2021 - 1.24 mg/dL 12/14/2021   Sodium 12/17/2021 - 0.24 mmol/L 140   Potassium 3.5 - 5.1 mmol/L 3.6   Chloride 98 - 111 mmol/L 104   CO2 22 - 32 mmol/L 24   Calcium 8.9 - 10.3 mg/dL 9.8     Other pertinent labs none  Imaging/Diagnostic Tests: No new imaging.  0.97, MD 12/15/2021, 3:37 PM  PGY-1, [facility] FPTS Intern pager: [phone], text pages welcome Secure chat group [facility] Teaching Service

## 2021-12-15 NOTE — Discharge Summary (Addendum)
Family Medicine Teaching [facility] Discharge Summary  Patient name: [name] Medical record number: [id number] Date of birth: 1965-09-07 Age: 56 y.o. Gender: male Date of Admission: 12/12/2021  Date of Discharge: 12/15/21 Admitting Physician: Celine Mans, MD  Primary Care Provider: Bess Kinds, MD Consultants: Neurology  Indication for Hospitalization: blurred vision  Brief Hospital Course:  [name] is a 56 y.o. male who was admitted to [facility] for acute onset blurred vision and dizziness concerning for ischemic CVA.  His hospital course is also by problem, refer to the H&P for additional information.  CVA Work Up In the ED vitals are stable and CBC, CMP, ethanol, lipase, troponin, ammonia unremarkable.  CT head unremarkable for any acute changes.  Neurology was consulted and recommended admission for stroke work-up.  CTA head and neck negative.  Patient was loaded with aspirin and Plavix and continued on, recommendation to continue DAPT for 3 weeks/months.  Restratification labs within normal limits.  Echocardiogram obtained and demonstrated small pericardial effusion, EF 50-55%, low normal LV function and mild to moderate tricuspid regurgitation.  There was a delay in obtaining MRI of his brain due to his pacemaker which required interrogation. MRI brain obtained on HOD #3*** and . PT/OT/SLP consulted and recommended Home Health. His statin was increased to 20 mg for high-intensity dosage.    Other conditions chronic and stable.  Issues for PCP follow up:  Hydralazine held during hospitalization and at discharge for AKI and lower/normotensive BP Repeat BMP     Disposition: home with home health  Discharge Condition: stable  Discharge Exam:   Subjective: NAEO. Denies chest pain, SOB, N/V. Wanted to know when he is leaving the hospital.  Objective: Vitals:   12/15/21 0500 12/15/21 0722  BP: (!) 122/92 117/88  Pulse: 65 63  Resp:   16  Temp: 97.7 F (36.5 C) 98.2 F (36.8 C)  SpO2: 98% 96%   General: A&O, NAD HEENT: PEERL, No sign of trauma, EOM grossly intact Cardiac: RRR, no m/r/g Respiratory: CTAB, normal WOB, no w/c/r GI: Soft, NTTP, non-distended  Extremities: NTTP, no peripheral edema. Neuro: No focal deficits, moves all four extremities appropriately. Psych: Appropriate mood and affect   Significant Procedures:   ECHO 12/13/21 IMPRESSIONS   1. Left ventricular ejection fraction, by estimation, is 50 to 55%. The  left ventricle has low normal function. The left ventricle has no regional  wall motion abnormalities. Left ventricular diastolic parameters were  normal.   2. Right ventricular systolic function is normal. The right ventricular  size is normal. There is normal pulmonary artery systolic pressure.   3. A small pericardial effusion is present. The pericardial effusion is  localized near the right atrium.   4. The mitral valve is normal in structure. Trivial mitral valve  regurgitation. No evidence of mitral stenosis.   5. Tricuspid valve regurgitation is mild to moderate.   6. The aortic valve is tricuspid. Aortic valve regurgitation is not  visualized. No aortic stenosis is present.   7. The inferior vena cava is normal in size with <50% respiratory  variability, suggesting right atrial pressure of 8 mmHg.  Significant Labs and Imaging:  No results for input(s): "WBC", "HGB", "HCT", "PLT" in the last 48 hours. Recent Labs  Lab 12/15/21 0452  NA 140  K 3.6  CL 104  CO2 24  GLUCOSE 98  BUN 19  CREATININE 1.33*  CALCIUM 9.8    MR Brain 12/15/2021  CT Angio Head/Neck 12/13/2021  IMPRESSION: Normal CTA of the head and neck. No large vessel occlusion, hemodynamically significant stenosis, or other acute vascular abnormality.  CT Head w/o Contrast 12/12/2021 IMPRESSION: No acute intracranial abnormality. Mild senescent change.  Results/Tests Pending at Time of Discharge:  none  Discharge Medications:  Allergies as of 12/15/2021       Reactions   Bactrim Rash   Sulfamethoxazole-trimethoprim Rash        Medication List     STOP taking these medications    hydrALAZINE 50 MG tablet Commonly known as: APRESOLINE       TAKE these medications    acetaminophen 325 MG tablet Commonly known as: TYLENOL Take 2 tablets (650 mg total) by mouth every 4 (four) hours as needed for mild pain (or temp > 37.5 C (99.5 F)).   allopurinol 100 MG tablet Commonly known as: ZYLOPRIM TAKE 1 TABLET(100 MG) BY MOUTH TWICE DAILY What changed:  how much to take how to take this when to take this   amiodarone 200 MG tablet Commonly known as: PACERONE Take 0.5 tablets (100 mg total) by mouth daily.   aspirin EC 81 MG tablet Take 1 tablet (81 mg total) by mouth daily. Swallow whole. Start taking on: December 16, 2021   carvedilol 25 MG tablet Commonly known as: COREG Take 1 tablet (25 mg total) by mouth 2 (two) times daily with a meal.   CENTRUM SILVER 50+MEN PO Take 1 tablet by mouth daily.   cholecalciferol 25 MCG (1000 UNIT) tablet Commonly known as: VITAMIN D Take 1,000 Units by mouth daily.   clopidogrel 75 MG tablet Commonly known as: PLAVIX Take 1 tablet (75 mg total) by mouth daily for 16 days. Start taking on: December 16, 2021   colchicine 0.6 MG tablet Take 1 tablet (0.6 mg total) by mouth daily.   diclofenac sodium 1 % Gel Commonly known as: VOLTAREN Apply 2 g topically 4 (four) times daily.   empagliflozin 10 MG Tabs tablet Commonly known as: Jardiance Take 1 tablet (10 mg total) by mouth daily.   Entresto 97-103 MG Generic drug: sacubitril-valsartan Take 1 tablet by mouth 2 (two) times daily.   Fish Oil 1000 MG Cpdr Take 1,000 mg by mouth daily.   fluticasone 50 MCG/ACT nasal spray Commonly known as: FLONASE SHAKE LIQUID AND USE 2 SPRAYS IN EACH NOSTRIL DAILY What changed: See the new instructions.   furosemide 40 MG  tablet Commonly known as: LASIX Take 1 tablet (40 mg total) by mouth daily as needed for fluid or edema.   gabapentin 300 MG capsule Commonly known as: NEURONTIN Take 300 mg by mouth 3 (three) times daily.   Odefsey 200-25-25 MG Tabs tablet Generic drug: emtricitabine-rilpivir-tenofovir AF Take 1 tablet by mouth daily.   ondansetron 4 MG tablet Commonly known as: ZOFRAN Take 4 mg by mouth every 8 (eight) hours as needed for nausea or vomiting.   rosuvastatin 20 MG tablet Commonly known as: CRESTOR Take 1 tablet (20 mg total) by mouth daily. Start taking on: December 16, 2021 What changed:  medication strength how much to take   spironolactone 25 MG tablet Commonly known as: ALDACTONE Take 1 tablet (25 mg total) by mouth daily.   Tivicay 50 MG tablet Generic drug: dolutegravir TAKE 1 TABLET BY MOUTH DAILY What changed:  how much to take how to take this when to take this   traZODone 50 MG tablet Commonly known as: DESYREL TAKE 1 TABLET(50 MG) BY MOUTH AT BEDTIME What changed: See  the new instructions.        Discharge Instructions: Please refer to Patient Instructions section of EMR for full details.  Patient was counseled important signs and symptoms that should prompt return to medical care, changes in medications, dietary instructions, activity restrictions, and follow up appointments.   Follow-Up Appointments:  Follow-up Information     [facility]. Schedule an appointment as soon as possible for a visit in 1 week(s).   Specialty: Rehabilitation Contact information: [address] [id number] mc [place] [address] [phone]        [facility] Social Services Follow up.   Why: Call for transportation benefits Contact information: [address], [place], [place] [address]  Phone: [phone]                Celine Mans, MD 12/15/2021, 12:56 PM PGY-1, [facility]  Med[facility]

## 2021-12-15 NOTE — Progress Notes (Signed)
Occupational Therapy Treatment Patient Details Name: [name] MRN: [id number] DOB: [birth year] Today's Date: 12/15/2021   History of present illness Pt is a 56 yr old male who presented due to blurred vision and dizziness. Ct of neck and head did not find any acute findings. MRI pending. PMH: HIV, chronic systolic heart faluire, pacemaker, gout, HLD, HEP C   OT comments  Pt progressing towards goals, completing ADLs and transfers with supervision this session. Pt still reporting blurriness of vision, improved with L eye occluded. Pt educated on low vision strategies for home, and safety. Pt verbalized understanding, pt states his spouse can drive him places. Pt presenting with impairments listed below, will follow acutely. Recommend HHOT at d/c.   Recommendations for follow up therapy are one component of a multi-disciplinary discharge planning process, led by the attending physician.  Recommendations may be updated based on patient status, additional functional criteria and insurance authorization.    Follow Up Recommendations  Home health OT    Assistance Recommended at Discharge Intermittent Supervision/Assistance  Patient can return home with the following  A little help with bathing/dressing/bathroom;Assistance with cooking/housework;Assist for transportation   Equipment Recommendations  None recommended by OT    Recommendations for Other Services      Precautions / Restrictions Precautions Precautions: Fall Restrictions Weight Bearing Restrictions: No       Mobility Bed Mobility Overal bed mobility: Independent                  Transfers Overall transfer level: Needs assistance Equipment used: Rolling walker (2 wheels) Transfers: Sit to/from Stand Sit to Stand: Supervision                 Balance Overall balance assessment: Needs assistance Sitting-balance support: Feet supported Sitting balance-Leahy Scale: Good     Standing balance support:  Bilateral upper extremity supported, No upper extremity supported Standing balance-Leahy Scale: Fair                             ADL either performed or assessed with clinical judgement   ADL Overall ADL's : Needs assistance/impaired                     Lower Body Dressing: Supervision/safety;Sitting/lateral leans Lower Body Dressing Details (indicate cue type and reason): simulated Toilet Transfer: Supervision/safety;Ambulation;Regular Toilet;Rolling walker (2 wheels) Toilet Transfer Details (indicate cue type and reason): simulated         Functional mobility during ADLs: Min guard;Rolling walker (2 wheels)      Extremity/Trunk Assessment Upper Extremity Assessment Upper Extremity Assessment: Overall WFL for tasks assessed   Lower Extremity Assessment Lower Extremity Assessment: Defer to PT evaluation        Vision   Vision Assessment?: Vision impaired- to be further tested in functional context Additional Comments: blurry vision, per pt report is note better/worse with near/far. improved when covering L eye,   Perception Perception Perception: Not tested   Praxis Praxis Praxis: Not tested    Cognition Arousal/Alertness: Awake/alert Behavior During Therapy: WFL for tasks assessed/performed Overall Cognitive Status: Within Functional Limits for tasks assessed                                          Exercises      Shoulder Instructions       General  Comments VSS on RA; educated on strategies for low vision    Pertinent Vitals/ Pain       Pain Assessment Pain Assessment: No/denies pain  Home Living                                          Prior Functioning/Environment              Frequency  Min 2X/week        Progress Toward Goals  OT Goals(current goals can now be found in the care plan section)  Progress towards OT goals: Progressing toward goals  Acute Rehab OT Goals Patient  Stated Goal: improve vision OT Goal Formulation: With patient Time For Goal Achievement: 12/27/21 Potential to Achieve Goals: Good ADL Goals Pt Will Transfer to Toilet: with modified independence;ambulating Additional ADL Goal #1: Pt will be able to complete ADLS/IADLS in stadning for 15 mins to be able to return home  Plan Discharge plan remains appropriate;Frequency remains appropriate    Co-evaluation                 AM-PAC OT "6 Clicks" Daily Activity     Outcome Measure   Help from another person eating meals?: None Help from another person taking care of personal grooming?: None Help from another person toileting, which includes using toliet, bedpan, or urinal?: A Little Help from another person bathing (including washing, rinsing, drying)?: A Little Help from another person to put on and taking off regular upper body clothing?: None Help from another person to put on and taking off regular lower body clothing?: A Little 6 Click Score: 21    End of Session Equipment Utilized During Treatment: Rolling walker (2 wheels)  OT Visit Diagnosis: Unsteadiness on feet (R26.81);Low vision, both eyes (H54.2)   Activity Tolerance Patient tolerated treatment well   Patient Left in bed;with call bell/phone within reach;with bed alarm set;with family/visitor present   Nurse Communication Mobility status        Time: 7494-4967 OT Time Calculation (min): 22 min  Charges: OT General Charges $OT Visit: 1 Visit OT Treatments $Therapeutic Activity: 8-22 mins  Alfonzo Beers, OTD, OTR/L Acute Rehab [phone]   Mayer Masker 12/15/2021, 12:45 PM

## 2021-12-16 ENCOUNTER — Observation Stay (HOSPITAL_COMMUNITY): Payer: Medicare Other

## 2021-12-16 DIAGNOSIS — R42 Dizziness and giddiness: Secondary | ICD-10-CM | POA: Diagnosis not present

## 2021-12-16 DIAGNOSIS — I6782 Cerebral ischemia: Secondary | ICD-10-CM | POA: Diagnosis not present

## 2021-12-16 DIAGNOSIS — R29818 Other symptoms and signs involving the nervous system: Secondary | ICD-10-CM | POA: Diagnosis not present

## 2021-12-16 DIAGNOSIS — H538 Other visual disturbances: Secondary | ICD-10-CM | POA: Diagnosis not present

## 2021-12-16 NOTE — Progress Notes (Signed)
STROKE TEAM PROGRESS NOTE   INTERVAL HISTORY Patient is not in the room and has gone for MRI scan.  MRI was not done yesterday as MRI scanner was backed up.  Patient wants to go home after that Vitals:   12/16/21 0442 12/16/21 0752 12/16/21 0810 12/16/21 1243  BP: 123/84  (!) 148/106 (!) 149/113  Pulse: 62 64  64  Resp: 14 17  20   Temp: 98.1 F (36.7 C) 98.3 F (36.8 C)  98.2 F (36.8 C)  TempSrc: Oral Oral  Oral  SpO2: 97% 98%  99%  Weight:      Height:       CBC:  Recent Labs  Lab 12/12/21 1836  WBC 9.4  NEUTROABS 6.1  HGB 13.9  HCT 42.2  MCV 91.9  PLT 187   Basic Metabolic Panel:  Recent Labs  Lab 12/12/21 1836 12/15/21 0452  NA 141 140  K 3.9 3.6  CL 111 104  CO2 18* 24  GLUCOSE 95 98  BUN 18 19  CREATININE 1.17 1.33*  CALCIUM 9.7 9.8   Lipid Panel:  Recent Labs  Lab 12/13/21 0316  CHOL 137  TRIG 103  HDL 68  CHOLHDL 2.0  VLDL 21  LDLCALC 48   HgbA1c:  Recent Labs  Lab 12/13/21 0316  HGBA1C 5.0   Urine Drug Screen: No results for input(s): "LABOPIA", "COCAINSCRNUR", "LABBENZ", "AMPHETMU", "THCU", "LABBARB" in the last 168 hours.  Alcohol Level  Recent Labs  Lab 12/12/21 1836  ETH <10    IMAGING past 24 hours MR BRAIN WO CONTRAST  Result Date: 12/16/2021 CLINICAL DATA:  Neuro deficit, acute, stroke suspected. Acute onset of blurred vision and dizziness. EXAM: MRI HEAD WITHOUT CONTRAST TECHNIQUE: Multiplanar, multiecho pulse sequences of the brain and surrounding structures were obtained without intravenous contrast. COMPARISON:  CT studies July 14 and 15. FINDINGS: Brain: Diffusion imaging does not show any acute or subacute infarction. Old small vessel insult in the right para median pons. Old small vessel infarction of the inferior left cerebellum. Mild chronic small-vessel ischemic changes of the cerebral hemispheric white matter. No cortical or large vessel territory infarction. No mass lesion, hemorrhage, hydrocephalus or extra-axial  collection. Vascular: Major vessels at the base of the brain show flow. Skull and upper cervical spine: Negative Sinuses/Orbits: Clear/normal Other: None IMPRESSION: No acute MR finding. Old small vessel infarction of the right para median pons. Mild chronic small-vessel ischemic changes of the cerebral hemispheric white matter. Electronically Signed   By: July 16 M.D.   On: 12/16/2021 13:24    PHYSICAL EXAM He is alert and oriented. Follows commands, name objects and repeats. No aphasia or dysarthria. Smile is symmetric, tongue midline. Tracks, crosses midline, no visual deficits except blurred vision with some difficulty in reading small print.  Face is symmetric without weakness.  Symmetric strength in extremities. Uppers 5/5, lowers 5/5. No ataxia. No sensation deficits  ASSESSMENT/PLAN [name] is a 56 y.o. male with history of  hypertension, heart failure, HIV who presents with blurred vision and unsteadiness that started on awakening this morning.    Probable posterior circulation TIA  Etiology:  likely small vessel disease CT head No acute abnormality.   CTA head & neck Normal CTA of the head and neck. No large vessel occlusion, hemodynamically significant stenosis, or other acute vascular abnormality MRI no acute infarct.  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 2D Echo 50-55% LDL 48 HgbA1c 5.0 VTE prophylaxis - Lovenox    Diet  Diet Heart Room service appropriate? Yes; Fluid consistency: Thin   No antithrombotic prior to admission, now on aspirin 81 mg daily and clopidogrel 75 mg daily. For 3 weeks then ASA alone Therapy recommendations:  HH OT Disposition:  pending  Hypertension Home meds:  hydralazine, coreg Stable Permissive hypertension (OK if < 220/120) but gradually normalize in 5-7 days Long-term BP goal normotensive  Hyperlipidemia Home meds:  crestor, resumed in hospital LDL 48, goal < 70 High intensity statin not indicated due to below goal Continue  statin at discharge  CHF Nonischemic cardiomyopathy  VT Lasix, Entresto and aldactone home meds, currently on hold  S/p AICD/PPM, on amio Echo pending   Diabetes type II Controlled Home meds:  Jardiance  HgbA1c 5.0, goal < 7.0 CBGs SSI  Other Stroke Risk Factors Obesity, Body mass index is 30.55 kg/m., BMI >/= 30 associated with increased stroke risk, recommend weight loss, diet and exercise as appropriate Congestive heart failure  Other Active Problems HIV  gout  Hospital day # 0 MRI scan of the brain personally reviewed shows no acute abnormality.  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  Recommend aspirin 81 mg daily alone and discharged home. [name], MD Medical Director [facility] Pager: [phone] 12/16/2021 1:28 PM  To contact Stroke Continuity provider, please refer to [email or web address]. After hours, contact General Neurology

## 2021-12-16 NOTE — Progress Notes (Signed)
Physical Therapy Treatment and Discharge Patient Details Name: [name] MRN: [id number] DOB: 30-Jun-1965 Today's Date: 12/16/2021   History of Present Illness Pt is a 56 yr old male who presented due to blurred vision and dizziness. Ct of neck and head did not find any acute findings. MRI pending. PMH: HIV, chronic systolic heart faluire, pacemaker, gout, HLD, HEP C    PT Comments    Pt reports mild blurred vision remains but demonstrates safe ambulatory ability and navigates stairs safely. Educated on x1 vertical view VOR exercise 2/2 impaired tracking. Denies dizziness today. Eager to return home. All goals met. D/c from PT services.  Patient discharged from PT services secondary to goals met and no further PT needs identified.   Progress and discharge plan discussed with patient and/or caregiver: Patient/Caregiver agrees with plan       Ellouise Newer 12/16/2021, 9:06 AM    Recommendations for follow up therapy are one component of a multi-disciplinary discharge planning process, led by the attending physician.  Recommendations may be updated based on patient status, additional functional criteria and insurance authorization.  Follow Up Recommendations  No PT follow up     Assistance Recommended at Discharge PRN  Patient can return home with the following     Equipment Recommendations  None recommended by PT    Recommendations for Other Services       Precautions / Restrictions Precautions Precautions: Fall Restrictions Weight Bearing Restrictions: No     Mobility  Bed Mobility Overal bed mobility: Independent                  Transfers Overall transfer level: Modified independent                 General transfer comment: moves quickly    Ambulation/Gait Ambulation/Gait assistance: Modified independent (Device/Increase time) Gait Distance (Feet): 250 Feet Assistive device: Rolling walker (2 wheels), None Gait Pattern/deviations:  Step-through pattern, Drifts right/left   Gait velocity interpretation: >4.37 ft/sec, indicative of normal walking speed   General Gait Details: Mod I with AMBULATION, reviewed with and without AD. Challenged with higher level dynamic tasks including forward, backwards, variable speed changes, and quick turns. Minor drift but able to self correctr.   Stairs Stairs: Yes Stairs assistance: Modified independent (Device/Increase time) Stair Management: No rails Number of Stairs: 4 General stair comments: Performed safely without LOB   Wheelchair Mobility    Modified Rankin (Stroke Patients Only) Modified Rankin (Stroke Patients Only) Pre-Morbid Rankin Score: No symptoms Modified Rankin: No significant disability     Balance Overall balance assessment: Needs assistance Sitting-balance support: Feet supported Sitting balance-Leahy Scale: Good     Standing balance support: No upper extremity supported Standing balance-Leahy Scale: Good Standing balance comment: denies dizziness. feels back to baseline                            Cognition Arousal/Alertness: Awake/alert Behavior During Therapy: WFL for tasks assessed/performed Overall Cognitive Status: Within Functional Limits for tasks assessed                                          Exercises      General Comments General comments (skin integrity, edema, etc.): Low vision strategies reviewed. x1 view vertically for HEP due to tracking difficulties.      Pertinent Vitals/Pain  Pain Assessment Pain Assessment: No/denies pain    Home Living                          Prior Function            PT Goals (current goals can now be found in the care plan section) Acute Rehab PT Goals Patient Stated Goal: go home PT Goal Formulation: With patient Time For Goal Achievement: 12/27/21 Potential to Achieve Goals: Good Progress towards PT goals: Goals met/education completed, patient  discharged from PT    Frequency    Min 3X/week      PT Plan Current plan remains appropriate    Co-evaluation              AM-PAC PT "6 Clicks" Mobility   Outcome Measure  Help needed turning from your back to your side while in a flat bed without using bedrails?: None Help needed moving from lying on your back to sitting on the side of a flat bed without using bedrails?: None Help needed moving to and from a bed to a chair (including a wheelchair)?: None Help needed standing up from a chair using your arms (e.g., wheelchair or bedside chair)?: None Help needed to walk in hospital room?: None Help needed climbing 3-5 steps with a railing? : None 6 Click Score: 24    End of Session   Activity Tolerance: Patient tolerated treatment well Patient left: in bed;with call bell/phone within reach   PT Visit Diagnosis: Other abnormalities of gait and mobility (R26.89);Dizziness and giddiness (R42)     Time: 5643-3295 PT Time Calculation (min) (ACUTE ONLY): 15 min  Charges:  $Therapeutic Activity: 8-22 mins                     Candie Mile, PT    Ellouise Newer 12/16/2021, 9:07 AM

## 2021-12-16 NOTE — Progress Notes (Signed)
Pt wheeled off unit with all his belongings by his side.

## 2021-12-19 ENCOUNTER — Ambulatory Visit (INDEPENDENT_AMBULATORY_CARE_PROVIDER_SITE_OTHER): Payer: Medicare Other | Admitting: Family Medicine

## 2021-12-19 VITALS — BP 104/84 | HR 72 | Ht 64.0 in | Wt 180.6 lb

## 2021-12-19 DIAGNOSIS — H00015 Hordeolum externum left lower eyelid: Secondary | ICD-10-CM

## 2021-12-19 DIAGNOSIS — Z09 Encounter for follow-up examination after completed treatment for conditions other than malignant neoplasm: Secondary | ICD-10-CM | POA: Diagnosis not present

## 2021-12-19 DIAGNOSIS — N179 Acute kidney failure, unspecified: Secondary | ICD-10-CM

## 2021-12-19 NOTE — Progress Notes (Unsigned)
    SUBJECTIVE:   CHIEF COMPLAINT / HPI:   Hospital follow-up Admitted 7/15-7/18 for acute onset blurry vision concerning for possible CVA. 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   Patient's symptoms resolved during admission and have not recurred. He feels well without vision changes, dizziness, headaches, weakness, or other neurologic symptoms. Has been taking clopidogrel daily but not aspirin. He's wondering when to stop the clopidogrel because he was told only 21 days total.  His only complaint today is a small bump on his left lower eyelid that is painful and he noticed 2 days ago.  Per discharge summary: Issues for follow up Hydralazine held during hospitalization and at discharge for AKI and lower/normotensive BP Statin increased to Rosuvastatin 20 mg for high intensity, monitor side effects Repeat BMP to ensure resolution of AKI. F/u neurological sx  PERTINENT  PMH / PSH: HTN, HFrEF, HIV, Chronic Hep C, gout  OBJECTIVE:   BP 104/84   Pulse 72   Ht 5\' 4"  (1.626 m)   Wt 180 lb 9.6 oz (81.9 kg)   SpO2 97%   BMI 31.00 kg/m   Gen: NAD, pleasant, able to participate in exam HEENT: PERRL, EOMI, very small (punctate) erythematous papule on left lower eyelid CV: RRR, normal S1/S2, no murmur Resp: Normal effort, lungs CTAB Extremities: no edema or cyanosis Skin: warm and dry, no rashes noted Neuro: alert, CN II-XII intact, sensation equal and intact to light touch in all extremities, 5/5 strength in all extremities, normal finger-to-nose, normal gait Psych: Normal affect and mood   ASSESSMENT/PLAN:   Hospital Follow-Up Patient admitted for blurry vision- had MRI that was negative for acute CVA and symptoms have resolved. There was some confusion regarding his anti-platelet medications at discharge, which we clarified today. I advised patient to stop the clopidogrel and take aspirin 81mg  daily alone instead,  as recommended in his discharge summary/med rec. Will check BMP today to ensure resolution of AKI.   Hordeolum Small stye of left lower eyelid noted. Encouraged warm compresses.    , MD [facility]

## 2021-12-19 NOTE — Patient Instructions (Addendum)
It was great to meet you!  See medication section of this packet for details: STOP the clopidogrel START taking one baby aspirin (81mg ) daily  Your hydralazine has been discontinued. You should remain off this medication.  We are rechecking your kidney function today. I will send you a MyChart message with the results.  For your eye: Continue doing warm compresses several times per day. Unfortunately there are no specific medications to help with your stye. If it becomes larger, more bothersome, or does not go away on it's own, we can refer you to the eye doctor for surgical removal.  -Dr   Stye A stye, also known as a hordeolum, is a bump that forms on an eyelid. It may look like a pimple next to the eyelash. A stye can form inside the eyelid (internal stye) or outside the eyelid (external stye). A stye can cause redness, swelling, and pain on the eyelid. Styes are very common. Anyone can get them at any age. They usually occur in just one eye at a time, but you may have more than one in either eye. What are the causes? A stye is caused by an infection. The infection is almost always caused by bacteria called Staphylococcus aureus. This is a common type of bacteria that lives on the skin. An internal stye may result from an infected oil-producing gland inside the eyelid. An external stye may be caused by an infection at the base of the eyelash (hair follicle). What increases the risk? You are more likely to develop a stye if: You have had a stye before. You have any of these conditions: Red, itchy, inflamed eyelids (blepharitis). A skin condition such as seborrheic dermatitis or rosacea. High fat levels in your blood (lipids). Dry eyes. What are the signs or symptoms? The most common symptom of a stye is eyelid pain. Internal styes are more painful than external styes. Other symptoms may include: Painful swelling of your eyelid. A scratchy feeling in your eye. Tearing and  redness of your eye. A pimple-like bump on the edge of the eyelid. Pus draining from the stye. How is this diagnosed? Your health care provider may be able to diagnose a stye just by examining your eye. The health care provider may also check to make sure: You do not have a fever or other signs of a more serious infection. The infection has not spread to other parts of your eye or areas around your eye. How is this treated? Most styes will clear up in a few days without treatment or with warm compresses applied to the area.  In some cases, your health care provider may give you a steroid injection in the eyelid. If your stye does not heal with routine treatment, your health care provider may drain pus from the stye using a thin blade or needle. This may be done if the stye is large, causing a lot of pain, or affecting your vision. Follow these instructions at home: Take over-the-counter and prescription medicines only as told by your health care provider. This includes eye drops or ointments. Apply a warm, wet cloth (warm compress) to your eye for 5-10 minutes, 4 to 6 times a day. Clean the affected eyelid as directed by your health care provider. Do not wear contact lenses or eye makeup until your stye has healed and your health care provider says that it is safe. Do not try to pop or drain the stye. Do not rub your eye. Contact a health  care provider if: You have chills or a fever. Your stye does not go away after several days. Your stye affects your vision. Your eyeball becomes swollen, red, or painful. Get help right away if: You have pain when moving your eye around. Summary A stye is a bump that forms on an eyelid. It may look like a pimple next to the eyelash. A stye can form inside the eyelid (internal stye) or outside the eyelid (external stye). A stye can cause redness, swelling, and pain on the eyelid. Your health care provider may be able to diagnose a stye just by examining  your eye. Apply a warm, wet cloth (warm compress) to your eye for 5-10 minutes, 4 to 6 times a day. This information is not intended to replace advice given to you by your health care provider. Make sure you discuss any questions you have with your health care provider. Document Revised: 07/24/2020 Document Reviewed: 07/24/2020 Elsevier Patient Education  2023 [employer].

## 2021-12-20 LAB — BASIC METABOLIC PANEL
BUN/Creatinine Ratio: 12 (ref 9–20)
BUN: 18 mg/dL (ref 6–24)
CO2: 25 mmol/L (ref 20–29)
Calcium: 9.7 mg/dL (ref 8.7–10.2)
Chloride: 104 mmol/L (ref 96–106)
Creatinine, Ser: 1.5 mg/dL — ABNORMAL HIGH (ref 0.76–1.27)
Glucose: 88 mg/dL (ref 70–99)
Potassium: 3.8 mmol/L (ref 3.5–5.2)
Sodium: 142 mmol/L (ref 134–144)
eGFR: 54 mL/min/{1.73_m2} — ABNORMAL LOW (ref >59)

## 2021-12-22 ENCOUNTER — Telehealth: Payer: Self-pay | Admitting: Family Medicine

## 2021-12-22 ENCOUNTER — Other Ambulatory Visit: Payer: Self-pay | Admitting: Family Medicine

## 2021-12-22 DIAGNOSIS — N179 Acute kidney failure, unspecified: Secondary | ICD-10-CM

## 2021-12-22 NOTE — Telephone Encounter (Signed)
Called patient with lab results. Mildly elevated creatinine consistent with AKI. Will plan to re-check BMP, lab appt made for 7/26 and will place future orders.

## 2021-12-22 NOTE — Addendum Note (Signed)
Addended by: Littie Deeds D on: 12/22/2021 01:57 PM   Modules accepted: Orders

## 2021-12-22 NOTE — Telephone Encounter (Signed)
Per Dr. Miquel Dunn advise holding spironolactone for the time being. Advised patient to hold spironolactone until labs result. Can restart pending results or may also need to hold Entresto and/or empagliflozin if worsening.

## 2021-12-22 NOTE — Telephone Encounter (Signed)
erroneous

## 2021-12-24 ENCOUNTER — Other Ambulatory Visit: Payer: Medicare Other

## 2021-12-24 DIAGNOSIS — N179 Acute kidney failure, unspecified: Secondary | ICD-10-CM | POA: Diagnosis not present

## 2021-12-25 ENCOUNTER — Telehealth: Payer: Self-pay | Admitting: Family Medicine

## 2021-12-25 ENCOUNTER — Telehealth (HOSPITAL_COMMUNITY): Payer: Self-pay | Admitting: *Deleted

## 2021-12-25 ENCOUNTER — Telehealth: Payer: Self-pay | Admitting: *Deleted

## 2021-12-25 LAB — BASIC METABOLIC PANEL
BUN/Creatinine Ratio: 12 (ref 9–20)
BUN: 14 mg/dL (ref 6–24)
CO2: 21 mmol/L (ref 20–29)
Calcium: 9.7 mg/dL (ref 8.7–10.2)
Chloride: 106 mmol/L (ref 96–106)
Creatinine, Ser: 1.19 mg/dL (ref 0.76–1.27)
Glucose: 96 mg/dL (ref 70–99)
Potassium: 4.2 mmol/L (ref 3.5–5.2)
Sodium: 143 mmol/L (ref 134–144)
eGFR: 71 mL/min/{1.73_m2} (ref >59)

## 2021-12-25 NOTE — Telephone Encounter (Signed)
Pts hydralazine was stopped at hospital discharge on 7/18. Pt called to report his bp was 139/99 today he asked if he should restart it.  Routed to  [employer]

## 2021-12-25 NOTE — Telephone Encounter (Signed)
Patient called back after speaking with Dr. Wynelle Link for lab results.  He states that he was unsure of what he should do about the spironolactone and hydralazine that were stopped in the hospital.  I informed patient that per provider's note, he can resume the spironolactone but there wasn't mention of his restarting the hydralazine.  I advised patient to make an appt with his cardiologist to follow up since he was due and to inquire with them about this medication.  He voiced understanding.  Maram Bently,CMA

## 2021-12-25 NOTE — Telephone Encounter (Signed)
Called patient with results. AKI has resolved. He can resume spironolactone. All questions answered.

## 2021-12-25 NOTE — Telephone Encounter (Signed)
Reviewed hospital d/c summary. Hydralazine was held b/c BP got too low. Would not recommend restarting now based on 1 BP reading alone.   Recommend that he continue to monitor BP over the next week and keep a log. If persistently elevated readings, then we can restart at a lower dose of 25 mg bid but will need to f/u w/ [place] first w/ phone call first before doing so.   Robbie Lis, PA-C

## 2022-01-09 ENCOUNTER — Other Ambulatory Visit: Payer: Self-pay

## 2022-01-09 ENCOUNTER — Encounter: Payer: Self-pay | Admitting: Infectious Diseases

## 2022-01-09 ENCOUNTER — Ambulatory Visit (INDEPENDENT_AMBULATORY_CARE_PROVIDER_SITE_OTHER): Payer: Medicare Other | Admitting: Infectious Diseases

## 2022-01-09 VITALS — BP 117/88 | HR 67 | Temp 98.4°F | Resp 16 | Wt 176.0 lb

## 2022-01-09 DIAGNOSIS — R233 Spontaneous ecchymoses: Secondary | ICD-10-CM | POA: Diagnosis not present

## 2022-01-09 LAB — CBC WITH DIFFERENTIAL/PLATELET
Absolute Monocytes: 660 cells/uL (ref 200–950)
Basophils Absolute: 53 cells/uL (ref 0–200)
Basophils Relative: 0.6 %
Eosinophils Absolute: 88 cells/uL (ref 15–500)
Eosinophils Relative: 1 %
HCT: 41.9 % (ref 38.5–50.0)
Hemoglobin: 14 g/dL (ref 13.2–17.1)
Lymphs Abs: 2490 cells/uL (ref 850–3900)
MCH: 30.3 pg (ref 27.0–33.0)
MCHC: 33.4 g/dL (ref 32.0–36.0)
MCV: 90.7 fL (ref 80.0–100.0)
MPV: 10.2 fL (ref 7.5–12.5)
Monocytes Relative: 7.5 %
Neutro Abs: 5509 cells/uL (ref 1500–7800)
Neutrophils Relative %: 62.6 %
Platelets: 190 10*3/uL (ref 140–400)
RBC: 4.62 10*6/uL (ref 4.20–5.80)
RDW: 12.9 % (ref 11.0–15.0)
Total Lymphocyte: 28.3 %
WBC: 8.8 10*3/uL (ref 3.8–10.8)

## 2022-01-09 MED ORDER — ROSUVASTATIN CALCIUM 20 MG PO TABS
20.0000 mg | ORAL_TABLET | Freq: Every day | ORAL | 1 refills | Status: DC
Start: 2022-01-09 — End: 2022-03-04

## 2022-01-09 NOTE — Patient Instructions (Addendum)
I suspect your rash is due to small blood vessels that are bleeding called petechiae  We can see this with blood thinners, even with Aspirin.   If you start noticing this spreading on your legs or to involve most of your arms please let your Primary care team know.

## 2022-01-09 NOTE — Progress Notes (Unsigned)
Subjective:   Chief Complaint  Patient presents with   Rash    Red/yellow lesions on B/L upper arms x 2+ weeks.       [name] is a 56 y.o. patient here today for evaluation of rash that has been present to inner arms.   Mr. Tassinari was admitted 7/15 - 7/18 for a mini-stroke, all symptoms of which resolved during admission.  Had a few medication changes in the hospital to which his hydralazine was held d/t AKI and borderline BP, increase in statin with new TIA, and initially recommended to have dual antiplatelet therapy with Plavix 75 mg daily + ASA  81 mg. He only took 1 dual antiplatelet dose then was instructed to drop back to ASA 81mg  daily only.   Since this time she has    Review of Systems: Review of Systems   Past Medical History:  Diagnosis Date   Chronic systolic heart failure (HCC)    a. Echo 12/05/11:  EF 20-25%, diff HK with mild sparing of IL wall, mild AI, mod MR, mild LAE, mild RVE, mild reduced RVF.;  b.  Echo 05/11/2012:  Mild LVH, EF 20-25%, Gr 1 diast dysfn, mod MR, mild LAE   Depression    G6PD deficiency    GERD (gastroesophageal reflux disease)    Hepatitis    Hep B and Hep C (patient does not report this but these are listed in previous notes.)   HIV infection (HCC)    Hypertension    NICM (nonischemic cardiomyopathy) (HCC)    cardia CTA 8/13 negative for obstructive CAD   Presence of permanent cardiac pacemaker    VT (ventricular tachycardia) (HCC)     Outpatient Medications Prior to Visit  Medication Sig Dispense Refill   acetaminophen (TYLENOL) 325 MG tablet Take 2 tablets (650 mg total) by mouth every 4 (four) hours as needed for mild pain (or temp > 37.5 C (99.5 F)).     allopurinol (ZYLOPRIM) 100 MG tablet TAKE 1 TABLET(100 MG) BY MOUTH TWICE DAILY (Patient taking differently: Take 100 mg by mouth 2 (two) times daily. TAKE 1 TABLET(100 MG) BY MOUTH TWICE DAILY) 180 tablet 3   amiodarone (PACERONE) 200 MG tablet Take 0.5 tablets (100  mg total) by mouth daily. 45 tablet 3   aspirin EC 81 MG tablet Take 1 tablet (81 mg total) by mouth daily. Swallow whole. 30 tablet 12   carvedilol (COREG) 25 MG tablet Take 1 tablet (25 mg total) by mouth 2 (two) times daily with a meal. 180 tablet 3   cholecalciferol (VITAMIN D) 25 MCG (1000 UNIT) tablet Take 1,000 Units by mouth daily.     colchicine 0.6 MG tablet Take 1 tablet (0.6 mg total) by mouth daily. 180 tablet 0   diclofenac sodium (VOLTAREN) 1 % GEL Apply 2 g topically 4 (four) times daily. 100 g 11   dolutegravir (TIVICAY) 50 MG tablet TAKE 1 TABLET BY MOUTH DAILY (Patient taking differently: Take 50 mg by mouth daily. TAKE 1 TABLET BY MOUTH DAILY) 30 tablet 5   empagliflozin (JARDIANCE) 10 MG TABS tablet Take 1 tablet (10 mg total) by mouth daily. 30 tablet 11   emtricitabine-rilpivir-tenofovir AF (ODEFSEY) 200-25-25 MG TABS tablet Take 1 tablet by mouth daily. 30 tablet 5   fluticasone (FLONASE) 50 MCG/ACT nasal spray SHAKE LIQUID AND USE 2 SPRAYS IN EACH NOSTRIL DAILY (Patient taking differently: Place 2 sprays into both nostrils daily.) 16 g 6  furosemide (LASIX) 40 MG tablet Take 1 tablet (40 mg total) by mouth daily as needed for fluid or edema. 90 tablet 3   gabapentin (NEURONTIN) 300 MG capsule Take 300 mg by mouth 3 (three) times daily.     Multiple Vitamins-Minerals (CENTRUM SILVER 50+MEN PO) Take 1 tablet by mouth daily.     Omega-3 Fatty Acids (FISH OIL) 1000 MG CPDR Take 1,000 mg by mouth daily.     ondansetron (ZOFRAN) 4 MG tablet Take 4 mg by mouth every 8 (eight) hours as needed for nausea or vomiting.  0   rosuvastatin (CRESTOR) 20 MG tablet Take 1 tablet (20 mg total) by mouth daily. 30 tablet 1   sacubitril-valsartan (ENTRESTO) 97-103 MG Take 1 tablet by mouth 2 (two) times daily. 180 tablet 3   spironolactone (ALDACTONE) 25 MG tablet Take 1 tablet (25 mg total) by mouth daily. 90 tablet 3   traZODone (DESYREL) 50 MG tablet TAKE 1 TABLET(50 MG) BY MOUTH AT BEDTIME  (Patient taking differently: Take 50 mg by mouth at bedtime.) 30 tablet 2   triamcinolone ointment (KENALOG) 0.1 % Apply topically.     No facility-administered medications prior to visit.    Allergies  Allergen Reactions   Bactrim Rash   Sulfamethoxazole-Trimethoprim Rash    Family History  Problem Relation Age of Onset   Lung cancer Mother    Colon cancer Other 50        Objective:  Objective  Vitals:   01/09/22 1509  BP: 117/88  Pulse: 67  Resp: 16  Temp: 98.4 F (36.9 C)  SpO2: 97%   Body mass index is 30.21 kg/m.   Physical Exam  Physical Exam       Assessment & Plan:    Problem List Items Addressed This Visit   None Visit Diagnoses     Petechiae    -  Primary   Relevant Orders   CBC with Differential/Platelet       Rexene Alberts, MSN, NP-C [facility] Office: [phone] Pager: [phone]

## 2022-01-12 DIAGNOSIS — R233 Spontaneous ecchymoses: Secondary | ICD-10-CM | POA: Insufficient documentation

## 2022-01-12 NOTE — Assessment & Plan Note (Addendum)
Rash most consistent with bruising/petechiae with recent addition of antiplatelet therapy. No findings of spreading rash on dependent skin, unlikely vasculitis as they are pretty isolated. Had AKI in the setting of hypo/borderline blood pressure that has resolved.  Discussed that while this is likely due to antiplatelet therapy, would continue recommended ASA 81 mg as directed by neurology for greater benefit here. He agreed. He will notify his PCP if there is any spreading or more severe presentation of the rash.   Check PLT count today - was decreasing during hospital stay recently and last check 180K.

## 2022-01-16 ENCOUNTER — Ambulatory Visit (HOSPITAL_COMMUNITY)
Admission: RE | Admit: 2022-01-16 | Discharge: 2022-01-16 | Disposition: A | Discharge status: Home / Self Care | Payer: Medicare Other | Source: Ambulatory Visit | Attending: Family Medicine | Admitting: Family Medicine

## 2022-01-16 ENCOUNTER — Encounter (HOSPITAL_COMMUNITY): Payer: Self-pay

## 2022-01-16 VITALS — BP 130/96 | HR 69 | Ht 64.0 in | Wt 173.8 lb

## 2022-01-16 DIAGNOSIS — G459 Transient cerebral ischemic attack, unspecified: Secondary | ICD-10-CM

## 2022-01-16 DIAGNOSIS — I5022 Chronic systolic (congestive) heart failure: Secondary | ICD-10-CM | POA: Diagnosis not present

## 2022-01-16 DIAGNOSIS — F1010 Alcohol abuse, uncomplicated: Secondary | ICD-10-CM | POA: Diagnosis not present

## 2022-01-16 DIAGNOSIS — Z7984 Long term (current) use of oral hypoglycemic drugs: Secondary | ICD-10-CM | POA: Diagnosis not present

## 2022-01-16 DIAGNOSIS — E785 Hyperlipidemia, unspecified: Secondary | ICD-10-CM | POA: Diagnosis not present

## 2022-01-16 DIAGNOSIS — I428 Other cardiomyopathies: Secondary | ICD-10-CM | POA: Insufficient documentation

## 2022-01-16 DIAGNOSIS — I5082 Biventricular heart failure: Secondary | ICD-10-CM | POA: Diagnosis not present

## 2022-01-16 DIAGNOSIS — B20 Human immunodeficiency virus [HIV] disease: Secondary | ICD-10-CM

## 2022-01-16 DIAGNOSIS — Z7982 Long term (current) use of aspirin: Secondary | ICD-10-CM | POA: Diagnosis not present

## 2022-01-16 DIAGNOSIS — I3139 Other pericardial effusion (noninflammatory): Secondary | ICD-10-CM | POA: Diagnosis not present

## 2022-01-16 DIAGNOSIS — I110 Hypertensive heart disease with heart failure: Secondary | ICD-10-CM | POA: Diagnosis not present

## 2022-01-16 DIAGNOSIS — I10 Essential (primary) hypertension: Secondary | ICD-10-CM

## 2022-01-16 DIAGNOSIS — Z79899 Other long term (current) drug therapy: Secondary | ICD-10-CM | POA: Diagnosis not present

## 2022-01-16 DIAGNOSIS — I472 Ventricular tachycardia, unspecified: Secondary | ICD-10-CM | POA: Diagnosis not present

## 2022-01-16 DIAGNOSIS — Z8673 Personal history of transient ischemic attack (TIA), and cerebral infarction without residual deficits: Secondary | ICD-10-CM | POA: Diagnosis not present

## 2022-01-16 LAB — BASIC METABOLIC PANEL
Anion gap: 8 (ref 5–15)
BUN: 14 mg/dL (ref 6–20)
CO2: 17 mmol/L — ABNORMAL LOW (ref 22–32)
Calcium: 9.3 mg/dL (ref 8.9–10.3)
Chloride: 115 mmol/L — ABNORMAL HIGH (ref 98–111)
Creatinine, Ser: 0.92 mg/dL (ref 0.61–1.24)
GFR, Estimated: >60 mL/min (ref >60)
Glucose, Bld: 81 mg/dL (ref 70–99)
Potassium: 3.8 mmol/L (ref 3.5–5.1)
Sodium: 140 mmol/L (ref 135–145)

## 2022-01-16 NOTE — Patient Instructions (Signed)
Labs done today, your results will be available in MyChart, we will contact you for abnormal readings.  Your physician recommends that you schedule a follow-up appointment in: 3-4 months with Dr [name]  If you have any questions or concerns before your next appointment please send [place] a message through [place] or call our office at [phone].    TO LEAVE A MESSAGE FOR THE NURSE SELECT OPTION 2, PLEASE LEAVE A MESSAGE INCLUDING: YOUR NAME DATE OF BIRTH CALL BACK NUMBER REASON FOR CALL**this is important as we prioritize the call backs  YOU WILL RECEIVE A CALL BACK THE SAME DAY AS LONG AS YOU CALL BEFORE 4:00 PM  At the [facility], you and your health needs are our priority. As part of our continuing mission to provide you with exceptional heart care, we have created designated Provider Care Teams. These Care Teams include your primary Cardiologist (physician) and Advanced Practice Providers (APPs- Physician Assistants and Nurse Practitioners) who all work together to provide you with the care you need, when you need it.   You may see any of the following providers on your designated Care Team at your next follow up: Dr [name] Dr Carron Curie, NP Robbie Lis, [place] [facility], [place] Karle Plumber, PharmD   Please be sure to bring in all your medications bottles to every appointment.

## 2022-01-16 NOTE — Progress Notes (Signed)
Patient ID: [name], male   DOB: 1966/02/22, 56 y.o.   MRN: [id number]   [facility]    PCP: Bess Kinds, MD Primary Cardiologist: Dr Ladona Ridgel  HF Cardiologist: Dr. [name]    HPI: [name] is a 56 y.o.. male with HIV dx'd 1998, HCV, ETOH abuse and CHF due to NICM s/p Medtronic CRT-D (1/15). He also has a h/o syncope and previously had an EP study which was non-inducible for VT.    Was admitted 8/14 for ADHF. Echo EF 20-25%.   Echo 10/16 EF 55%.  Admitted 8/18 for syncope from low BP resulting in trimalleolar fracture of the right ankle and left proximal tibia. He underwent ORIF of right trimalleolar fracture. Echo EF 65-70%.  Echo 10/21 EF 55-60%   Seen 3/22, stable NYHA II symptoms, Jardiance started.   Admitted 7/23 with stroke-like symptoms. CT head negative, Echo showed EF 50-55%, mild to moderate TR, small pericardial effusion. 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 Statin increased and started on ASA. Hydralazine stopped due to low BP. Hospitalization also c/b AKI. Discharged home, weight 176 lbs.  Today he returns for post hospital HF follow up. Overall feeling fine. He is not SOB walking on flat ground or with ADLs. Denies palpitations, abnormal bleeding, CP, dizziness, edema, or PND/Orthopnea. Appetite ok. No fever or chills. Weight at home 176 pounds. Taking all medications. Takes Lasix about once a week.   Cardiac Studies Echo (7/23): EF 50-55%, mild to moderate TR, small pericardial effusion. Echo (10/21): EF 55-60% Echo (8/18): EF 65-70%.  Echo (12/17): EF 55% Echo (10/16): EF 55% Echo (5/15): EF 20-25%  Echo (11/14): EF 20-25%, mod/severe MR, LA mod dilated  SH: Lives with partner in [place], drinks 2-3 12 oz beers daily.  Review of systems complete and found to be negative unless listed in HPI.    Family History  Problem Relation Age of Onset   Lung cancer Mother    Colon cancer Other 87   Past Medical  History:  Diagnosis Date   Chronic systolic heart failure (HCC)    a. Echo 12/05/11:  EF 20-25%, diff HK with mild sparing of IL wall, mild AI, mod MR, mild LAE, mild RVE, mild reduced RVF.;  b.  Echo 05/11/2012:  Mild LVH, EF 20-25%, Gr 1 diast dysfn, mod MR, mild LAE   Depression    G6PD deficiency    GERD (gastroesophageal reflux disease)    Hepatitis    Hep B and Hep C (patient does not report this but these are listed in previous notes.)   HIV infection (HCC)    Hypertension    NICM (nonischemic cardiomyopathy) (HCC)    cardia CTA 8/13 negative for obstructive CAD   Presence of permanent cardiac pacemaker    VT (ventricular tachycardia) (HCC)    Current Outpatient Medications  Medication Sig Dispense Refill   acetaminophen (TYLENOL) 325 MG tablet Take 2 tablets (650 mg total) by mouth every 4 (four) hours as needed for mild pain (or temp > 37.5 C (99.5 F)).     allopurinol (ZYLOPRIM) 100 MG tablet TAKE 1 TABLET(100 MG) BY MOUTH TWICE DAILY 180 tablet 3   amiodarone (PACERONE) 200 MG tablet Take 0.5 tablets (100 mg total) by mouth daily. 45 tablet 3   aspirin EC 81 MG tablet Take 1 tablet (81 mg total) by mouth daily. Swallow whole. 30 tablet 12   carvedilol (COREG) 25 MG tablet Take 1 tablet (25 mg total)  by mouth 2 (two) times daily with a meal. 180 tablet 3   cholecalciferol (VITAMIN D) 25 MCG (1000 UNIT) tablet Take 1,000 Units by mouth daily.     colchicine 0.6 MG tablet Take 1 tablet (0.6 mg total) by mouth daily. 180 tablet 0   diclofenac sodium (VOLTAREN) 1 % GEL Apply 2 g topically 4 (four) times daily. 100 g 11   dolutegravir (TIVICAY) 50 MG tablet TAKE 1 TABLET BY MOUTH DAILY (Patient taking differently: Take 50 mg by mouth daily. TAKE 1 TABLET BY MOUTH DAILY) 30 tablet 5   empagliflozin (JARDIANCE) 10 MG TABS tablet Take 1 tablet (10 mg total) by mouth daily. 30 tablet 11   emtricitabine-rilpivir-tenofovir AF (ODEFSEY) 200-25-25 MG TABS tablet Take 1 tablet by mouth daily.  30 tablet 5   fluticasone (FLONASE) 50 MCG/ACT nasal spray SHAKE LIQUID AND USE 2 SPRAYS IN EACH NOSTRIL DAILY (Patient taking differently: Place 2 sprays into both nostrils daily.) 16 g 6   furosemide (LASIX) 40 MG tablet Take 1 tablet (40 mg total) by mouth daily as needed for fluid or edema. (Patient taking differently: Take 40 mg by mouth daily as needed for fluid or edema. Pt takes 20 mg once weekly) 90 tablet 3   gabapentin (NEURONTIN) 300 MG capsule Take 300 mg by mouth 3 (three) times daily.     Multiple Vitamins-Minerals (CENTRUM SILVER 50+MEN PO) Take 1 tablet by mouth daily.     Omega-3 Fatty Acids (FISH OIL) 1000 MG CPDR Take 1,000 mg by mouth daily.     rosuvastatin (CRESTOR) 20 MG tablet Take 1 tablet (20 mg total) by mouth daily. 30 tablet 1   sacubitril-valsartan (ENTRESTO) 97-103 MG Take 1 tablet by mouth 2 (two) times daily. 180 tablet 3   spironolactone (ALDACTONE) 25 MG tablet Take 1 tablet (25 mg total) by mouth daily. 90 tablet 3   traZODone (DESYREL) 50 MG tablet TAKE 1 TABLET(50 MG) BY MOUTH AT BEDTIME (Patient taking differently: Take 50 mg by mouth at bedtime.) 30 tablet 2   triamcinolone ointment (KENALOG) 0.1 % Apply topically.     ondansetron (ZOFRAN) 4 MG tablet Take 4 mg by mouth every 8 (eight) hours as needed for nausea or vomiting. (Patient not taking: Reported on 01/16/2022)  0   No current facility-administered medications for this encounter.   BP (!) 130/96   Pulse 69   Ht 5\' 4"  (1.626 m)   Wt 78.8 kg (173 lb 12.8 oz)   SpO2 96%   BMI 29.83 kg/m   Wt Readings from Last 3 Encounters:  01/16/22 78.8 kg (173 lb 12.8 oz)  01/09/22 79.8 kg (176 lb)  12/19/21 81.9 kg (180 lb 9.6 oz)   PHYSICAL EXAM: General:  NAD. No resp difficulty HEENT: Normal Neck: Supple. No JVD. Carotids 2+ bilat; no bruits. No lymphadenopathy or thryomegaly appreciated. Cor: PMI nondisplaced. Regular rate & rhythm. No rubs, gallops or murmurs. Lungs: Clear Abdomen: Obese,  nontender, nondistended. No hepatosplenomegaly. No bruits or masses. Good bowel sounds. Extremities: No cyanosis, clubbing, rash, edema Neuro: Alert & oriented x 3, cranial nerves grossly intact. Moves all 4 extremities w/o difficulty. Affect pleasant.  ECG (personally reviewed): a-sensed v-paced, 73 bpm  Device interrogation (personally reviewed): OptiVol down, thoracic impedence stable, no AF, no VT, 4.6 hrs daily/activity.  ASSESSMENT & PLAN: 1) Chronic systolic HF; now with recovered EF: Nonischemic cardiomyopathy, ?due to ETOH abuse.  - EF 20-25% in 2014.  - s/p Medtronic CRT-D (06/2013).  - EF  55% (03/2015)  - Echo (8/18): EF 65-70%.  - Echo (10/21): EF 55-60%  - Echo (7/23): EF 50-55%, RV ok, mild to moderate TR - NYHA I-II. Volume looks good today.  - Restart hydralazine 12.5 mg tid. Monitor BP at home. - Continue Entresto 97/103 mg bid. - Continue Coreg 25 mg bid.   - Continue spiro 25 mg daily.  - Continue Jardiance 10 mg daily. - BMET today.  2) HTN - Elevated. - Restart hydralazine as above.  3) Ventricular tachycardia  - Has ICD, followed Dr. Ladona Ridgel  - No VT on interrogation.  - Continue amio 100 mg daily.  - Realizes need for eye doctor f/u. - Recent LFTs and TSH ok.  4) HIV - Stable.  - Followed by Dr Luciana Axe.   5) ETOH abuse  - Continues to drink 2-3 beers/day. - Encouraged complete cessation.  6) HLD - Continue statin. - Lipids per PCP.  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 - Continue ASA + statin  Follow up 3-4 months with Dr. [name], as scheduled.  [place], FNP  01/16/2022 2:41 PM

## 2022-01-17 ENCOUNTER — Other Ambulatory Visit: Payer: Self-pay | Admitting: Student

## 2022-01-22 ENCOUNTER — Other Ambulatory Visit (HOSPITAL_COMMUNITY): Payer: Self-pay

## 2022-01-22 MED ORDER — HYDRALAZINE HCL 25 MG PO TABS: 12.5000 mg | ORAL_TABLET | Freq: Three times a day (TID) | ORAL | 3 refills | Status: DC

## 2022-01-27 ENCOUNTER — Ambulatory Visit (INDEPENDENT_AMBULATORY_CARE_PROVIDER_SITE_OTHER): Payer: Medicare Other

## 2022-01-27 DIAGNOSIS — I5022 Chronic systolic (congestive) heart failure: Secondary | ICD-10-CM

## 2022-01-28 LAB — CUP PACEART REMOTE DEVICE CHECK
Battery Remaining Longevity: 88 mo
Battery Voltage: 3.01 V
Brady Statistic AP VP Percent: 0.06 %
Brady Statistic AP VS Percent: 0.02 %
Brady Statistic AS VP Percent: 98.5 %
Brady Statistic AS VS Percent: 1.42 %
Brady Statistic RA Percent Paced: 0.08 %
Brady Statistic RV Percent Paced: 0.09 %
Date Time Interrogation Session: 20230829063425
HighPow Impedance: 78 Ohm
Implantable Lead Implant Date: 20150126
Implantable Lead Implant Date: 20150126
Implantable Lead Implant Date: 20150126
Implantable Lead Location: 753858
Implantable Lead Location: 753859
Implantable Lead Location: 753860
Implantable Lead Model: 4396
Implantable Lead Model: 5076
Implantable Lead Model: 6935
Implantable Pulse Generator Implant Date: 20220826
Lead Channel Impedance Value: 1045 Ohm
Lead Channel Impedance Value: 304 Ohm
Lead Channel Impedance Value: 342 Ohm
Lead Channel Impedance Value: 380 Ohm
Lead Channel Impedance Value: 513 Ohm
Lead Channel Impedance Value: 722 Ohm
Lead Channel Pacing Threshold Amplitude: 0.625 V
Lead Channel Pacing Threshold Amplitude: 1 V
Lead Channel Pacing Threshold Amplitude: 1.625 V
Lead Channel Pacing Threshold Pulse Width: 0.4 ms
Lead Channel Pacing Threshold Pulse Width: 0.4 ms
Lead Channel Pacing Threshold Pulse Width: 0.4 ms
Lead Channel Sensing Intrinsic Amplitude: 1.75 mV
Lead Channel Sensing Intrinsic Amplitude: 1.75 mV
Lead Channel Sensing Intrinsic Amplitude: 9.625 mV
Lead Channel Sensing Intrinsic Amplitude: 9.625 mV
Lead Channel Setting Pacing Amplitude: 1.5 V
Lead Channel Setting Pacing Amplitude: 2 V
Lead Channel Setting Pacing Amplitude: 2.75 V
Lead Channel Setting Pacing Pulse Width: 0.4 ms
Lead Channel Setting Pacing Pulse Width: 0.4 ms
Lead Channel Setting Sensing Sensitivity: 0.3 mV

## 2022-02-05 DIAGNOSIS — K7469 Other cirrhosis of liver: Secondary | ICD-10-CM | POA: Diagnosis not present

## 2022-02-12 DIAGNOSIS — I428 Other cardiomyopathies: Secondary | ICD-10-CM | POA: Diagnosis not present

## 2022-02-12 DIAGNOSIS — Z0000 Encounter for general adult medical examination without abnormal findings: Secondary | ICD-10-CM | POA: Diagnosis not present

## 2022-02-12 DIAGNOSIS — I4901 Ventricular fibrillation: Secondary | ICD-10-CM | POA: Diagnosis not present

## 2022-02-12 DIAGNOSIS — E785 Hyperlipidemia, unspecified: Secondary | ICD-10-CM | POA: Diagnosis not present

## 2022-02-12 DIAGNOSIS — I110 Hypertensive heart disease with heart failure: Secondary | ICD-10-CM | POA: Diagnosis not present

## 2022-02-12 DIAGNOSIS — Z79899 Other long term (current) drug therapy: Secondary | ICD-10-CM | POA: Diagnosis not present

## 2022-02-12 DIAGNOSIS — G4700 Insomnia, unspecified: Secondary | ICD-10-CM | POA: Diagnosis not present

## 2022-02-12 DIAGNOSIS — M109 Gout, unspecified: Secondary | ICD-10-CM | POA: Diagnosis not present

## 2022-02-20 NOTE — Progress Notes (Signed)
Remote ICD transmission.   

## 2022-02-23 DIAGNOSIS — E785 Hyperlipidemia, unspecified: Secondary | ICD-10-CM | POA: Diagnosis not present

## 2022-02-23 DIAGNOSIS — Z23 Encounter for immunization: Secondary | ICD-10-CM | POA: Diagnosis not present

## 2022-02-23 DIAGNOSIS — Z8673 Personal history of transient ischemic attack (TIA), and cerebral infarction without residual deficits: Secondary | ICD-10-CM | POA: Diagnosis not present

## 2022-02-23 DIAGNOSIS — I4901 Ventricular fibrillation: Secondary | ICD-10-CM | POA: Diagnosis not present

## 2022-02-23 DIAGNOSIS — G4700 Insomnia, unspecified: Secondary | ICD-10-CM | POA: Diagnosis not present

## 2022-02-23 DIAGNOSIS — I110 Hypertensive heart disease with heart failure: Secondary | ICD-10-CM | POA: Diagnosis not present

## 2022-02-23 DIAGNOSIS — Z0001 Encounter for general adult medical examination with abnormal findings: Secondary | ICD-10-CM | POA: Diagnosis not present

## 2022-02-23 DIAGNOSIS — Z0000 Encounter for general adult medical examination without abnormal findings: Secondary | ICD-10-CM | POA: Diagnosis not present

## 2022-02-24 ENCOUNTER — Telehealth: Payer: Self-pay

## 2022-02-24 NOTE — Patient Outreach (Signed)
  Care Coordination   02/24/2022 Name: [name] MRN: [id number] DOB: 17-Sep-1965   Care Coordination Outreach Attempts:  An unsuccessful telephone outreach was attempted today to offer the patient information about available care coordination services as a benefit of their health plan.   Follow Up Plan:  Additional outreach attempts will be made to offer the patient care coordination information and services.   Encounter Outcome:  No Answer  Care Coordination Interventions Activated:  No   Care Coordination Interventions:  No, not indicated    Jone Baseman, RN, MSN [facility] Care Management Care Management Coordinator Direct Line [phone]

## 2022-03-04 ENCOUNTER — Other Ambulatory Visit: Payer: Self-pay | Admitting: Student

## 2022-03-04 ENCOUNTER — Other Ambulatory Visit: Payer: Self-pay | Admitting: *Deleted

## 2022-03-04 MED ORDER — ROSUVASTATIN CALCIUM 20 MG PO TABS
20.0000 mg | ORAL_TABLET | Freq: Every day | ORAL | 1 refills | Status: DC
Start: 2022-03-04 — End: 2022-03-04

## 2022-03-05 ENCOUNTER — Telehealth: Payer: Self-pay

## 2022-03-05 NOTE — Patient Outreach (Signed)
  Care Coordination   03/05/2022 Name: [name] MRN: [id number] DOB: 07/26/1965   Care Coordination Outreach Attempts:  A second unsuccessful outreach was attempted today to offer the patient with information about available care coordination services as a benefit of their health plan.     Follow Up Plan:  Additional outreach attempts will be made to offer the patient care coordination information and services.   Encounter Outcome:  No Answer  Care Coordination Interventions Activated:  No   Care Coordination Interventions:  No, not indicated    Jone Baseman, RN, MSN [facility] Care Management Care Management Coordinator Direct Line [phone]

## 2022-03-05 NOTE — Telephone Encounter (Signed)
Patient calls nurse line requesting to be removed from our patient list.   Patient reports he has a new PCP now.   Patient has been removed per his request.

## 2022-04-20 ENCOUNTER — Telehealth: Payer: Self-pay | Admitting: Internal Medicine

## 2022-04-20 ENCOUNTER — Other Ambulatory Visit: Payer: Self-pay | Admitting: Nurse Practitioner

## 2022-04-20 DIAGNOSIS — K7469 Other cirrhosis of liver: Secondary | ICD-10-CM

## 2022-04-20 NOTE — Telephone Encounter (Signed)
Pt called back regarding note received in [facility].    Pt stated he "passed out," fell forward hitting his face, and has two black eyes, cut on his nose, and knot on his forehead from falling on 04/14/2022.  Pt awoke and did not go to the ER.  Pt states his blood pressure has been running a little low.   Pt stated, today he was in the [facility] building seeing his Liver Doctor, and his blood pressure was 90/76.  He rode the elevator up to [facility] but did not have an appointment, so he went home.   The patient has a MDT BiV ICD, and sees Dr. Ladona Ridgel and Dr. [name].   After assessing the facts, I had patient do a BP / HR reading with me over the telephone.  His BP was 137/96, and HR 75. No Hypotension was present.   Pt told that we will reach out to our Device Team to assess the 04/14/2022 event, do determine what happened.  Pt advised to eat a salty snack and increase his fluid intake when feeling this way.   Pt told we will assess the transmission, and advise.   Pt has upcoming appointment with Dr. [name] on 05/06/2022 and Mr. Natasha Bence on 05/08/2022.  Follow up still required.

## 2022-04-20 NOTE — Telephone Encounter (Signed)
Blood pressure is not ICD related. It goes to Dr. Ladona Ridgel nurse.

## 2022-04-20 NOTE — Telephone Encounter (Signed)
Pt called back per message received from [facility], Device clinic RN.    Marchelle Folks RN stated no recorded episodes or arrhythmias.  Device, MDT BiV ICD appears to be functioning WNL.    Pt advised of this, but also recommended to follow up with Dr. [name] regarding the syncopal, "passing out" episode, to investigate Pt c/o hypotension.   Pt advised to call Dr. [name] office for follow up to assess current medications, and assess POC.    Pt understood, and stated will do so.  Pt has upcoming appointment on 05/06/22, but may need to be seen this week before holiday weekend to assess hypotension concerns.  Pt told to contact [facility] or go to the ER for worsening symptoms.

## 2022-04-20 NOTE — Telephone Encounter (Signed)
[facility] states the patient's BP was 96/78 so they advised he not take his BP medication until he spoke with a nurse at the office.

## 2022-04-20 NOTE — Telephone Encounter (Addendum)
Reviewed patient's transmission.  Normal Device Function.  Nothing that correlates with patient's event or symptoms on 04/14/22.  Optivol/Thoracic Impedance WNL but is beginning a slight trend towards s/s of retention.   Presenting is AS/VP 75 normal rhythm.  No recorded episodes or arrhythmias   Suggest patient follow up with gen card for blood pressure medication management/symptom review and continue fluid retention status monitoring.

## 2022-04-20 NOTE — Telephone Encounter (Signed)
Patient came in today looking for sooner appt with cardiologist (Dr Ladona Ridgel) he explained to me that he has "fallen out" on 11/14 2023. He said his BP had been low and that could maybe be the cause. I looked for an appt and there was nothing sooner with PA or Dr Ladona Ridgel. He did state that his PCP is looking at his BP meds and may be changing that. Please advise. CB # [id number].

## 2022-04-21 ENCOUNTER — Ambulatory Visit (HOSPITAL_COMMUNITY)
Admission: RE | Admit: 2022-04-21 | Discharge: 2022-04-21 | Disposition: A | Discharge status: Home / Self Care | Payer: Medicare Other | Source: Ambulatory Visit | Attending: Family Medicine | Admitting: Family Medicine

## 2022-04-21 ENCOUNTER — Inpatient Hospital Stay (HOSPITAL_COMMUNITY)
Admission: RE | Admit: 2022-04-21 | Discharge: 2022-04-21 | Disposition: A | Discharge status: Home / Self Care | Payer: Medicare Other | Source: Ambulatory Visit | Attending: Internal Medicine | Admitting: Internal Medicine

## 2022-04-21 ENCOUNTER — Encounter (HOSPITAL_COMMUNITY): Payer: Self-pay

## 2022-04-21 VITALS — HR 69 | Wt 179.2 lb

## 2022-04-21 DIAGNOSIS — I428 Other cardiomyopathies: Secondary | ICD-10-CM | POA: Insufficient documentation

## 2022-04-21 DIAGNOSIS — Z8673 Personal history of transient ischemic attack (TIA), and cerebral infarction without residual deficits: Secondary | ICD-10-CM | POA: Diagnosis not present

## 2022-04-21 DIAGNOSIS — Z21 Asymptomatic human immunodeficiency virus [HIV] infection status: Secondary | ICD-10-CM

## 2022-04-21 DIAGNOSIS — I472 Ventricular tachycardia, unspecified: Secondary | ICD-10-CM

## 2022-04-21 DIAGNOSIS — Z7982 Long term (current) use of aspirin: Secondary | ICD-10-CM | POA: Diagnosis not present

## 2022-04-21 DIAGNOSIS — F1010 Alcohol abuse, uncomplicated: Secondary | ICD-10-CM

## 2022-04-21 DIAGNOSIS — B20 Human immunodeficiency virus [HIV] disease: Secondary | ICD-10-CM

## 2022-04-21 DIAGNOSIS — Z79899 Other long term (current) drug therapy: Secondary | ICD-10-CM | POA: Insufficient documentation

## 2022-04-21 DIAGNOSIS — E785 Hyperlipidemia, unspecified: Secondary | ICD-10-CM | POA: Diagnosis not present

## 2022-04-21 DIAGNOSIS — I639 Cerebral infarction, unspecified: Secondary | ICD-10-CM | POA: Diagnosis not present

## 2022-04-21 DIAGNOSIS — R55 Syncope and collapse: Secondary | ICD-10-CM

## 2022-04-21 DIAGNOSIS — Z791 Long term (current) use of non-steroidal anti-inflammatories (NSAID): Secondary | ICD-10-CM | POA: Diagnosis not present

## 2022-04-21 DIAGNOSIS — I3139 Other pericardial effusion (noninflammatory): Secondary | ICD-10-CM | POA: Insufficient documentation

## 2022-04-21 DIAGNOSIS — I5082 Biventricular heart failure: Secondary | ICD-10-CM | POA: Insufficient documentation

## 2022-04-21 DIAGNOSIS — I110 Hypertensive heart disease with heart failure: Secondary | ICD-10-CM | POA: Diagnosis not present

## 2022-04-21 DIAGNOSIS — I959 Hypotension, unspecified: Secondary | ICD-10-CM | POA: Insufficient documentation

## 2022-04-21 DIAGNOSIS — I5022 Chronic systolic (congestive) heart failure: Secondary | ICD-10-CM

## 2022-04-21 DIAGNOSIS — Z79624 Long term (current) use of inhibitors of nucleotide synthesis: Secondary | ICD-10-CM | POA: Insufficient documentation

## 2022-04-21 DIAGNOSIS — I10 Essential (primary) hypertension: Secondary | ICD-10-CM

## 2022-04-21 DIAGNOSIS — Z7984 Long term (current) use of oral hypoglycemic drugs: Secondary | ICD-10-CM | POA: Diagnosis not present

## 2022-04-21 DIAGNOSIS — G459 Transient cerebral ischemic attack, unspecified: Secondary | ICD-10-CM

## 2022-04-21 LAB — COMPREHENSIVE METABOLIC PANEL
ALT: 25 U/L (ref 0–44)
AST: 27 U/L (ref 15–41)
Albumin: 4.1 g/dL (ref 3.5–5.0)
Alkaline Phosphatase: 52 U/L (ref 38–126)
Anion gap: 10 (ref 5–15)
BUN: 20 mg/dL (ref 6–20)
CO2: 22 mmol/L (ref 22–32)
Calcium: 9.3 mg/dL (ref 8.9–10.3)
Chloride: 110 mmol/L (ref 98–111)
Creatinine, Ser: 1.12 mg/dL (ref 0.61–1.24)
GFR, Estimated: >60 mL/min (ref >60)
Glucose, Bld: 85 mg/dL (ref 70–99)
Potassium: 3.6 mmol/L (ref 3.5–5.1)
Sodium: 142 mmol/L (ref 135–145)
Total Bilirubin: 0.4 mg/dL (ref 0.3–1.2)
Total Protein: 7 g/dL (ref 6.5–8.1)

## 2022-04-21 LAB — BRAIN NATRIURETIC PEPTIDE: B Natriuretic Peptide: 83 pg/mL (ref 0.0–100.0)

## 2022-04-21 LAB — CBC
HCT: 41.3 % (ref 39.0–52.0)
Hemoglobin: 13.6 g/dL (ref 13.0–17.0)
MCH: 30.9 pg (ref 26.0–34.0)
MCHC: 32.9 g/dL (ref 30.0–36.0)
MCV: 93.9 fL (ref 80.0–100.0)
Platelets: 205 10*3/uL (ref 150–400)
RBC: 4.4 MIL/uL (ref 4.22–5.81)
RDW: 14.1 % (ref 11.5–15.5)
WBC: 9.7 10*3/uL (ref 4.0–10.5)
nRBC: 0 % (ref 0.0–0.2)

## 2022-04-21 LAB — TSH: TSH: 2.035 u[IU]/mL (ref 0.350–4.500)

## 2022-04-21 MED ORDER — FUROSEMIDE 20 MG PO TABS: 20.0000 mg | ORAL_TABLET | Freq: Every day | ORAL | 8 refills | Status: DC

## 2022-04-21 MED ORDER — POTASSIUM CHLORIDE CRYS ER 20 MEQ PO TBCR: 20.0000 meq | EXTENDED_RELEASE_TABLET | Freq: Every day | ORAL | 8 refills | Status: DC

## 2022-04-21 NOTE — Progress Notes (Signed)
Patient ID: [name], male   DOB: 09/05/1965, 56 y.o.   MRN: [id number]   Advanced Heart Failure Clinic Note    PCP: No primary care provider on file. Primary Cardiologist: Dr Ladona Ridgel  HF Cardiologist: Dr. [name]    HPI: [name] is a 56 y.o.. male with HIV dx'd 56, HCV, ETOH abuse and CHF due to NICM s/p Medtronic CRT-D (1/15). He also has a h/o syncope and previously had an EP study which was non-inducible for VT.    Admitted 8/14 for ADHF. Echo EF 20-25%.   Echo 10/16 EF 55%.  Admitted 8/18 for syncope from low BP resulting in trimalleolar fracture of the right ankle and left proximal tibia. He underwent ORIF of right trimalleolar fracture. Echo EF 65-70%.  Echo 10/21 EF 55-60%   Seen 3/22, stable NYHA II symptoms, Jardiance started.   Admitted 7/23 with stroke-like symptoms. CT head negative, echo showed EF 50-55%, mild to moderate TR, small pericardial effusion. 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 Statin increased and started on ASA. Hydralazine stopped due to low BP. Hospitalization also c/b AKI. Discharged home, weight 176 lbs.  Follow up 8/23, NYHA I-II, volume OK. Hydralazine restarted with elevated BP.   Called device clinic 04/20/22 after passing out on 04/14/22. He did not go to ED. Device interrogation OK and showed no arrhythmias. BP had been soft and advised to follow up with HF team to adjust meds.  Today he returns for an acute visit after passing out on 04/14/22 (see above). He had 2 beers before his fall (typically drinks 2-3 beers/day). Had low BP yesterday at liver appt, 96/78. Overall feeling fine. He is not SOB with activity. Denies palpitations, abnormal bleeding, CP, dizziness, edema, or PND/Orthopnea. Appetite ok. No fever or chills. Weight at home 176 pounds. Taking all medications. Has been taking hydralazine 25 mg tablet once a day.   Cardiac Studies - Echo (7/23): EF 50-55%, mild to moderate TR, small pericardial  effusion.  - Echo (10/21): EF 55-60%  - Echo (8/18): EF 65-70%.   - Echo (12/17): EF 55%  - Echo (10/16): EF 55%  - Echo (5/15): EF 20-25%   - Echo (11/14): EF 20-25%, mod/severe MR, LA mod dilated  SH: Lives with partner in [place], drinks 2-3 12 oz beers daily.  Review of systems complete and found to be negative unless listed in HPI.    Family History  Problem Relation Age of Onset   Lung cancer Mother    Colon cancer Other 74   Past Medical History:  Diagnosis Date   Chronic systolic heart failure (HCC)    a. Echo 12/05/11:  EF 20-25%, diff HK with mild sparing of IL wall, mild AI, mod MR, mild LAE, mild RVE, mild reduced RVF.;  b.  Echo 05/11/2012:  Mild LVH, EF 20-25%, Gr 1 diast dysfn, mod MR, mild LAE   Depression    G6PD deficiency    GERD (gastroesophageal reflux disease)    Hepatitis    Hep B and Hep C (patient does not report this but these are listed in previous notes.)   HIV infection (HCC)    Hypertension    NICM (nonischemic cardiomyopathy) (HCC)    cardia CTA 8/13 negative for obstructive CAD   Presence of permanent cardiac pacemaker    VT (ventricular tachycardia) (HCC)    Current Outpatient Medications  Medication Sig Dispense Refill   acetaminophen (TYLENOL) 325 MG tablet Take 2 tablets (650 mg total)  by mouth every 4 (four) hours as needed for mild pain (or temp > 37.5 C (99.5 F)).     allopurinol (ZYLOPRIM) 100 MG tablet TAKE 1 TABLET(100 MG) BY MOUTH TWICE DAILY 180 tablet 3   amiodarone (PACERONE) 200 MG tablet Take 0.5 tablets (100 mg total) by mouth daily. 45 tablet 3   aspirin EC 81 MG tablet Take 1 tablet (81 mg total) by mouth daily. Swallow whole. 30 tablet 12   carvedilol (COREG) 25 MG tablet Take 1 tablet (25 mg total) by mouth 2 (two) times daily with a meal. 180 tablet 3   cholecalciferol (VITAMIN D) 25 MCG (1000 UNIT) tablet Take 1,000 Units by mouth daily.     colchicine 0.6 MG tablet Take 1 tablet (0.6 mg total) by mouth daily. 180  tablet 0   diclofenac sodium (VOLTAREN) 1 % GEL Apply 2 g topically 4 (four) times daily. 100 g 11   dolutegravir (TIVICAY) 50 MG tablet TAKE 1 TABLET BY MOUTH DAILY (Patient taking differently: Take 50 mg by mouth daily. TAKE 1 TABLET BY MOUTH DAILY) 30 tablet 5   empagliflozin (JARDIANCE) 10 MG TABS tablet Take 1 tablet (10 mg total) by mouth daily. 30 tablet 11   emtricitabine-rilpivir-tenofovir AF (ODEFSEY) 200-25-25 MG TABS tablet Take 1 tablet by mouth daily. 30 tablet 5   fluticasone (FLONASE) 50 MCG/ACT nasal spray SHAKE LIQUID AND USE 2 SPRAYS IN EACH NOSTRIL DAILY (Patient taking differently: Place 2 sprays into both nostrils daily.) 16 g 6   furosemide (LASIX) 20 MG tablet Take 1 tablet (20 mg total) by mouth daily. 30 tablet 8   gabapentin (NEURONTIN) 300 MG capsule Take 300 mg by mouth 3 (three) times daily.     Multiple Vitamins-Minerals (CENTRUM SILVER 50+MEN PO) Take 1 tablet by mouth daily.     Omega-3 Fatty Acids (FISH OIL) 1000 MG CPDR Take 1,000 mg by mouth daily.     ondansetron (ZOFRAN) 4 MG tablet Take 4 mg by mouth every 8 (eight) hours as needed for nausea or vomiting.  0   potassium chloride SA (KLOR-CON M) 20 MEQ tablet Take 1 tablet (20 mEq total) by mouth daily. 30 tablet 8   rosuvastatin (CRESTOR) 20 MG tablet TAKE 1 TABLET(20 MG) BY MOUTH DAILY 90 tablet 1   sacubitril-valsartan (ENTRESTO) 97-103 MG Take 1 tablet by mouth 2 (two) times daily. 180 tablet 3   spironolactone (ALDACTONE) 25 MG tablet Take 1 tablet (25 mg total) by mouth daily. 90 tablet 3   traZODone (DESYREL) 50 MG tablet TAKE 1 TABLET(50 MG) BY MOUTH AT BEDTIME 30 tablet 2   triamcinolone ointment (KENALOG) 0.1 % Apply topically.     No current facility-administered medications for this encounter.   Pulse 69   Wt 81.3 kg (179 lb 3.2 oz)   SpO2 97%   BMI 30.76 kg/m   Wt Readings from Last 3 Encounters:  04/21/22 81.3 kg (179 lb 3.2 oz)  01/16/22 78.8 kg (173 lb 12.8 oz)  01/09/22 79.8 kg (176  lb)   Orthostatics today 04/21/22: Sitting: 122/88 Standing 116/86  PHYSICAL EXAM: General:  NAD. No resp difficulty, walked into clinic HEENT: Normal Neck: Supple. JVP 7-8. Carotids 2+ bilat; no bruits. No lymphadenopathy or thryomegaly appreciated. Cor: PMI nondisplaced. Regular rate & rhythm. No rubs, gallops or murmurs. Lungs: Clear Abdomen: Soft, nontender, nondistended. No hepatosplenomegaly. No bruits or masses. Good bowel sounds. Extremities: No cyanosis, clubbing, rash, trace pedal edema Neuro: Alert & oriented x 3, cranial  nerves grossly intact. Moves all 4 extremities w/o difficulty. Affect pleasant.  ECG (personally reviewed): NSR, a-sensed v-paced, 69 bpm  Device interrogation (personally reviewed): OptiVol up, thoracic impedence down,  no AF, no VT, 4 hrs daily/activity.  ASSESSMENT & PLAN: Syncope - Device interrogation show no events, will place Zio 2 week AT to quantify arrhythmia - Orthostatics negative today. - ? Vasovagal vs hydralazine vs ETOH - Stop hydralazine. - I asked him to check BP daily and log. Given Rx for new BP cuff. - Labs today.  2. Chronic systolic HF; now with recovered EF:  - Nonischemic cardiomyopathy, ?due to ETOH abuse.  - EF 20-25% in 2014.  - s/p Medtronic CRT-D (1/15).  - Echo (10/16): EF 55% - Echo (8/18): EF 65-70%.  - Echo (10/21): EF 55-60%  - Echo (7/23): EF 50-55%, RV ok, mild to moderate TR - NYHA I-II. Volume up today on OptiVol. - Start Lasix 20 mg daily + 20 KCL daily.  - Continue Entresto 97/103 mg bid. - Continue Coreg 25 mg bid.   - Continue spiro 25 mg daily.  - Continue Jardiance 10 mg daily. - Labs today. Repeat BMET in 10-14 days.   3. HTN - Stable. - Stop hydralazine, as he was taking once daily, and monitor BP.  4. Ventricular tachycardia  - Has ICD, followed Dr. Ladona Ridgel  - No VT on interrogation.  - Continue amio 100 mg daily.  - Realizes need for eye doctor f/u. - Check amio labs.  5. HIV -  Stable.  - Followed by Dr Luciana Axe.   6. ETOH abuse  - Continues to drink 2-3 beers/day. - Encouraged complete cessation.  7. HLD - Continue statin. - Lipids per PCP. - LDL 48 7/23.  8. 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 - Continue ASA + statin  Keep follow up with Dr. [name] next month, as scheduled.  Anderson Malta Brayton, FNP  04/21/2022 3:54 PM

## 2022-04-21 NOTE — Patient Instructions (Addendum)
Thank you for coming in today  Labs were done today, if any labs are abnormal the clinic will call you No news is good news  EKG today  Your provider has recommended that  you wear a Zio Patch for 14 days.  This monitor will record your heart rhythm for our review.  IF you have any symptoms while wearing the monitor please press the button.  If you have any issues with the patch or you notice a red or orange light on it please call the company at [phone].  Once you remove the patch please mail it back to the company as soon as possible so we can get the results.   START Lasix 20 mg daily with 20 meq of Potassium daily   STOP Hydralazine  You have been given a prescription for BP cuff  Your physician recommends that you return for lab work in:  10 days BMET  Your physician recommends that you schedule a follow-up appointment in:  Keep follow up appointment with Dr. [name]

## 2022-04-28 ENCOUNTER — Ambulatory Visit (INDEPENDENT_AMBULATORY_CARE_PROVIDER_SITE_OTHER): Payer: Medicare Other

## 2022-04-28 DIAGNOSIS — G459 Transient cerebral ischemic attack, unspecified: Secondary | ICD-10-CM | POA: Diagnosis not present

## 2022-04-28 DIAGNOSIS — Z95810 Presence of automatic (implantable) cardiac defibrillator: Secondary | ICD-10-CM | POA: Diagnosis not present

## 2022-04-28 DIAGNOSIS — I5022 Chronic systolic (congestive) heart failure: Secondary | ICD-10-CM

## 2022-04-28 LAB — CUP PACEART REMOTE DEVICE CHECK
Battery Remaining Longevity: 92 mo
Battery Voltage: 3 V
Brady Statistic AP VP Percent: 0.14 %
Brady Statistic AP VS Percent: 0.02 %
Brady Statistic AS VP Percent: 98.37 %
Brady Statistic AS VS Percent: 1.47 %
Brady Statistic RA Percent Paced: 0.15 %
Brady Statistic RV Percent Paced: 0.12 %
Date Time Interrogation Session: 20231128123327
HighPow Impedance: 88 Ohm
Implantable Lead Connection Status: 753985
Implantable Lead Connection Status: 753985
Implantable Lead Connection Status: 753985
Implantable Lead Implant Date: 20150126
Implantable Lead Implant Date: 20150126
Implantable Lead Implant Date: 20150126
Implantable Lead Location: 753858
Implantable Lead Location: 753859
Implantable Lead Location: 753860
Implantable Lead Model: 4396
Implantable Lead Model: 5076
Implantable Lead Model: 6935
Implantable Pulse Generator Implant Date: 20220826
Lead Channel Impedance Value: 380 Ohm
Lead Channel Impedance Value: 399 Ohm
Lead Channel Impedance Value: 399 Ohm
Lead Channel Impedance Value: 437 Ohm
Lead Channel Impedance Value: 722 Ohm
Lead Channel Impedance Value: 893 Ohm
Lead Channel Pacing Threshold Amplitude: 0.625 V
Lead Channel Pacing Threshold Amplitude: 0.75 V
Lead Channel Pacing Threshold Amplitude: 1.125 V
Lead Channel Pacing Threshold Pulse Width: 0.4 ms
Lead Channel Pacing Threshold Pulse Width: 0.4 ms
Lead Channel Pacing Threshold Pulse Width: 0.4 ms
Lead Channel Sensing Intrinsic Amplitude: 0.875 mV
Lead Channel Sensing Intrinsic Amplitude: 0.875 mV
Lead Channel Sensing Intrinsic Amplitude: 8.625 mV
Lead Channel Sensing Intrinsic Amplitude: 8.625 mV
Lead Channel Setting Pacing Amplitude: 1.5 V
Lead Channel Setting Pacing Amplitude: 2 V
Lead Channel Setting Pacing Amplitude: 2.5 V
Lead Channel Setting Pacing Pulse Width: 0.4 ms
Lead Channel Setting Pacing Pulse Width: 0.4 ms
Lead Channel Setting Sensing Sensitivity: 0.3 mV
Zone Setting Status: 755011
Zone Setting Status: 755011

## 2022-04-29 NOTE — Progress Notes (Signed)
EPIC Encounter for ICM Monitoring  Patient Name: [name] is a 56 y.o. male Date: 04/29/2022 Primary Care Physican: No primary care provider on file. Primary Cardiologist: Bensimhon Electrophysiologist: Rae Roam Pacing: 98.4%        ICM check for HF clinic.  Transmission reviewed.    Optivol thoracic impedance suggesting possible fluid accumulation starting 11/15 and returned to normal on 11/24 (Lasix and Potassium started 11/21).     Prescribed:  Furosemide 20 mg take 1 tablet(s) (20 mg total) by mouth daily. Potassium 20 mEq take 1 tablet(s) (20 mEq total) by mouth daily.  Recommendations: No changes.  Follow-up plan:  No further ICM clinic phone appointments scheduled.   91 day device clinic remote transmission 07/28/2022.    EP/Cardiology Office Visits: 05/07/2023 with Dr. [name].  05/08/2022 with Otilio Saber, PA.    Copy of ICM check sent to Dr. Ladona Ridgel.  Copy sent to Prince Rome, NP as requested following 11/21 OV.    3 month ICM trend: 04/29/2022.    12-14 Month ICM trend:     Karie Soda, RN 04/29/2022 3:54 PM

## 2022-05-04 ENCOUNTER — Ambulatory Visit
Admission: RE | Admit: 2022-05-04 | Discharge: 2022-05-04 | Disposition: A | Discharge status: Home / Self Care | Payer: Medicare Other | Source: Ambulatory Visit | Attending: Nurse Practitioner | Admitting: Nurse Practitioner

## 2022-05-04 DIAGNOSIS — K7469 Other cirrhosis of liver: Secondary | ICD-10-CM

## 2022-05-06 ENCOUNTER — Ambulatory Visit (HOSPITAL_COMMUNITY)
Admission: RE | Admit: 2022-05-06 | Discharge: 2022-05-06 | Disposition: A | Discharge status: Home / Self Care | Payer: Medicare Other | Source: Ambulatory Visit | Attending: Internal Medicine | Admitting: Internal Medicine

## 2022-05-06 ENCOUNTER — Encounter (HOSPITAL_COMMUNITY): Payer: Self-pay | Admitting: Internal Medicine

## 2022-05-06 VITALS — BP 124/86 | HR 68 | Wt 175.0 lb

## 2022-05-06 DIAGNOSIS — Z8673 Personal history of transient ischemic attack (TIA), and cerebral infarction without residual deficits: Secondary | ICD-10-CM | POA: Insufficient documentation

## 2022-05-06 DIAGNOSIS — I428 Other cardiomyopathies: Secondary | ICD-10-CM | POA: Diagnosis not present

## 2022-05-06 DIAGNOSIS — I5022 Chronic systolic (congestive) heart failure: Secondary | ICD-10-CM | POA: Insufficient documentation

## 2022-05-06 DIAGNOSIS — I110 Hypertensive heart disease with heart failure: Secondary | ICD-10-CM | POA: Diagnosis not present

## 2022-05-06 DIAGNOSIS — E785 Hyperlipidemia, unspecified: Secondary | ICD-10-CM | POA: Insufficient documentation

## 2022-05-06 DIAGNOSIS — Z7984 Long term (current) use of oral hypoglycemic drugs: Secondary | ICD-10-CM | POA: Diagnosis not present

## 2022-05-06 DIAGNOSIS — I5082 Biventricular heart failure: Secondary | ICD-10-CM | POA: Insufficient documentation

## 2022-05-06 DIAGNOSIS — Z79899 Other long term (current) drug therapy: Secondary | ICD-10-CM | POA: Insufficient documentation

## 2022-05-06 DIAGNOSIS — F1010 Alcohol abuse, uncomplicated: Secondary | ICD-10-CM | POA: Insufficient documentation

## 2022-05-06 DIAGNOSIS — I10 Essential (primary) hypertension: Secondary | ICD-10-CM | POA: Diagnosis not present

## 2022-05-06 DIAGNOSIS — R55 Syncope and collapse: Secondary | ICD-10-CM | POA: Diagnosis not present

## 2022-05-06 DIAGNOSIS — I3139 Other pericardial effusion (noninflammatory): Secondary | ICD-10-CM | POA: Diagnosis not present

## 2022-05-06 DIAGNOSIS — I472 Ventricular tachycardia, unspecified: Secondary | ICD-10-CM | POA: Diagnosis not present

## 2022-05-06 DIAGNOSIS — B20 Human immunodeficiency virus [HIV] disease: Secondary | ICD-10-CM | POA: Insufficient documentation

## 2022-05-06 MED ORDER — ROSUVASTATIN CALCIUM 20 MG PO TABS: 10.0000 mg | ORAL_TABLET | Freq: Every day | ORAL | 11 refills | Status: DC

## 2022-05-06 NOTE — Progress Notes (Signed)
Patient ID: [name], male   DOB: 10/21/1965, 56 y.o.   MRN: [id number]   Advanced Heart Failure Clinic Note    PCP: Hillery Aldo, NP Primary Cardiologist: [name]  HF Cardiologist: [name]    HPI: [name] is a 56 y.o.. male with HIV dx'd 1998, HCV, ETOH abuse and CHF due to NICM s/p Medtronic CRT-D (1/15). He also has a h/o syncope and previously had an EP study which was non-inducible for VT.    Admitted 8/14 for ADHF. Echo EF 20-25%.   Echo 10/16 EF 55%.  Admitted 8/18 for syncope from low BP resulting in trimalleolar fracture of the right ankle and left proximal tibia. He underwent ORIF of right trimalleolar fracture. Echo EF 65-70%.  Echo 10/21 EF 55-60%   Seen 3/22, stable NYHA II symptoms, Jardiance started.   Admitted 7/23 with stroke-like symptoms. CT head negative, echo showed EF 50-55%, mild to moderate TR, small pericardial effusion. 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 Statin increased and started on ASA. Hydralazine stopped due to low BP. Hospitalization also c/b AKI. Discharged home, weight 176 lbs.  Follow up 8/23, NYHA I-II, volume OK. Hydralazine restarted with elevated BP.   Called device clinic 04/20/22 after passing out on 04/14/22. He did not go to ED. Device interrogation OK and showed no arrhythmias. BP had been soft -> hydralazine stopped.   Here for routine visit. Feeling much better. No further syncope. Denies CP, SOB, edema, dizziness. Compliant with meds.   Stable 1-2 beers today.   ICD interrogation: No VT/AF. Volume ok. Activity level 4hr/day Personally reviewed  Cardiac Studies - Echo (7/23): EF 50-55%, mild to moderate TR, small pericardial effusion. - Echo (10/21): EF 55-60% - Echo (8/18): EF 65-70%.  - Echo (12/17): EF 55% - Echo (10/16): EF 55% - Echo (5/15): EF 20-25%  - Echo (11/14): EF 20-25%, mod/severe MR, LA mod dilated  SH: Lives with partner in [place], drinks 2-3 12 oz beers  daily.  Review of systems complete and found to be negative unless listed in HPI.    Family History  Problem Relation Age of Onset   Lung cancer Mother    Colon cancer Other 79   Past Medical History:  Diagnosis Date   Chronic systolic heart failure (HCC)    a. Echo 12/05/11:  EF 20-25%, diff HK with mild sparing of IL wall, mild AI, mod MR, mild LAE, mild RVE, mild reduced RVF.;  b.  Echo 05/11/2012:  Mild LVH, EF 20-25%, Gr 1 diast dysfn, mod MR, mild LAE   Depression    G6PD deficiency    GERD (gastroesophageal reflux disease)    Hepatitis    Hep B and Hep C (patient does not report this but these are listed in previous notes.)   HIV infection (HCC)    Hypertension    NICM (nonischemic cardiomyopathy) (HCC)    cardia CTA 8/13 negative for obstructive CAD   Presence of permanent cardiac pacemaker    VT (ventricular tachycardia) (HCC)    Current Outpatient Medications  Medication Sig Dispense Refill   acetaminophen (TYLENOL) 325 MG tablet Take 2 tablets (650 mg total) by mouth every 4 (four) hours as needed for mild pain (or temp > 37.5 C (99.5 F)).     allopurinol (ZYLOPRIM) 100 MG tablet TAKE 1 TABLET(100 MG) BY MOUTH TWICE DAILY 180 tablet 3   amiodarone (PACERONE) 200 MG tablet Take 0.5 tablets (100 mg total) by mouth daily. 45  tablet 3   aspirin EC 81 MG tablet Take 1 tablet (81 mg total) by mouth daily. Swallow whole. 30 tablet 12   carvedilol (COREG) 25 MG tablet Take 1 tablet (25 mg total) by mouth 2 (two) times daily with a meal. 180 tablet 3   cholecalciferol (VITAMIN D) 25 MCG (1000 UNIT) tablet Take 1,000 Units by mouth daily.     colchicine 0.6 MG tablet Take 0.6 mg by mouth 2 (two) times daily.     diclofenac sodium (VOLTAREN) 1 % GEL Apply 2 g topically 4 (four) times daily. 100 g 11   dolutegravir (TIVICAY) 50 MG tablet TAKE 1 TABLET BY MOUTH DAILY 30 tablet 5   empagliflozin (JARDIANCE) 10 MG TABS tablet Take 1 tablet (10 mg total) by mouth daily. 30 tablet 11    emtricitabine-rilpivir-tenofovir AF (ODEFSEY) 200-25-25 MG TABS tablet Take 1 tablet by mouth daily. 30 tablet 5   fluticasone (FLONASE) 50 MCG/ACT nasal spray SHAKE LIQUID AND USE 2 SPRAYS IN EACH NOSTRIL DAILY 16 g 6   furosemide (LASIX) 20 MG tablet Take 1 tablet (20 mg total) by mouth daily. 30 tablet 8   gabapentin (NEURONTIN) 300 MG capsule Take 300 mg by mouth 3 (three) times daily.     Multiple Vitamins-Minerals (CENTRUM SILVER 50+MEN PO) Take 1 tablet by mouth daily.     Omega-3 Fatty Acids (FISH OIL) 1000 MG CPDR Take 1,000 mg by mouth daily.     ondansetron (ZOFRAN) 4 MG tablet Take 4 mg by mouth every 8 (eight) hours as needed for nausea or vomiting.  0   potassium chloride SA (KLOR-CON M) 20 MEQ tablet Take 1 tablet (20 mEq total) by mouth daily. 30 tablet 8   rosuvastatin (CRESTOR) 20 MG tablet Take 10 mg by mouth daily.     sacubitril-valsartan (ENTRESTO) 97-103 MG Take 1 tablet by mouth 2 (two) times daily. 180 tablet 3   spironolactone (ALDACTONE) 25 MG tablet Take 1 tablet (25 mg total) by mouth daily. 90 tablet 3   traZODone (DESYREL) 50 MG tablet TAKE 1 TABLET(50 MG) BY MOUTH AT BEDTIME 30 tablet 2   triamcinolone ointment (KENALOG) 0.1 % Apply topically.     No current facility-administered medications for this encounter.   BP 100/60   Pulse 68   Wt 79.4 kg (175 lb)   SpO2 95%   BMI 30.04 kg/m   Wt Readings from Last 3 Encounters:  05/06/22 79.4 kg (175 lb)  04/21/22 81.3 kg (179 lb 3.2 oz)  01/16/22 78.8 kg (173 lb 12.8 oz)    PHYSICAL EXAM: General:  Well appearing. No resp difficulty HEENT: normal Neck: supple. no JVD. Carotids 2+ bilat; no bruits. No lymphadenopathy or thryomegaly appreciated. Cor: PMI nondisplaced. Regular rate & rhythm. No rubs, gallops or murmurs. Lungs: clear Abdomen: obese soft, nontender, nondistended. No hepatosplenomegaly. No bruits or masses. Good bowel sounds. Extremities: no cyanosis, clubbing, rash, edema Neuro: alert &  orientedx3, cranial nerves grossly intact. moves all 4 extremities w/o difficulty. Affect pleasant  Device interrogation (personally reviewed):  No VT/AF. Volume ok. Activity level 4hr/day Personally reviewed  ASSESSMENT & PLAN: Syncope in 11/23 - Device interrogation showed no events, will place Zio 2 week AT to quantify arrhythmia - Still wearing Zio XT - ? Vasovagal vs hydralazine vs ETOH -> hydralazine stopped -> resolved  2. Chronic systolic HF; now with recovered EF:  - Nonischemic cardiomyopathy, ?due to ETOH abuse.  - EF 20-25% in 2014.  - s/p Medtronic CRT-D (  1/15).  - Echo (10/16): EF 55% - Echo (8/18): EF 65-70%.  - Echo (10/21): EF 55-60%  - Echo (7/23): EF 50-55%, RV ok, mild to moderate TR - Stable NYHA I  - Continue Lasix 20 mg daily + 20 KCL daily.  - Continue Entresto 97/103 mg bid. - Continue Coreg 25 mg bid.   - Continue spiro 25 mg daily.  - Continue Jardiance 10 mg daily. - Labs today  3. HTN - Blood pressure well controlled. Continue current regimen.  4. Ventricular tachycardia  - Has ICD, followed David. Ladona Rollins  - No VT on interrogation today  - Continue amio 100 mg daily.  - Recent labs ok   5. HIV - Stable.  - Followed by David Luciana Axe.   6. ETOH abuse  - Continues to drink ~2 beers/day. - Encouraged complete cessation.  7. HLD - Continue statin. - Lipids per PCP. - LDL 48 7/23.  8. 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 - Continue ASA + statin  [name], MD  2:21 PM

## 2022-05-06 NOTE — Patient Instructions (Signed)
It was great to see you today! No medication changes are needed at this time.   Your physician wants you to follow-up in: 6 months  in the Advanced Practitioners (PA/NP) Clinic   You will receive a reminder letter in the mail two months in advance. If you don't receive a letter, please call our office to schedule the follow-up appointment.  Do the following things EVERYDAY: Weigh yourself in the morning before breakfast. Write it down and keep it in a log. Take your medicines as prescribed Eat low salt foods--Limit salt (sodium) to 2000 mg per day.  Stay as active as you can everyday Limit all fluids for the day to less than 2 liters  At the Advanced Heart Failure Clinic, you and your health needs are our priority. As part of our continuing mission to provide you with exceptional heart care, we have created designated Provider Care Teams. These Care Teams include your primary Cardiologist (physician) and Advanced Practice Providers (APPs- Physician Assistants and Nurse Practitioners) who all work together to provide you with the care you need, when you need it.   You may see any of the following providers on your designated Care Team at your next follow up: Dr [name] Dr Marca Ancona Dr. Marcos Eke, NP Robbie Lis, [place] [facility] [place], [place] Brynda Peon, NP Karle Plumber, PharmD   Please be sure to bring in all your medications bottles to every appointment.   If you have any questions or concerns before your next appointment please send [place] a message through [place] or call our office at [phone].    TO LEAVE A MESSAGE FOR THE NURSE SELECT OPTION 2, PLEASE LEAVE A MESSAGE INCLUDING: YOUR NAME DATE OF BIRTH CALL BACK NUMBER REASON FOR CALL**this is important as we prioritize the call backs  YOU WILL RECEIVE A CALL BACK THE SAME DAY AS LONG AS YOU CALL BEFORE 4:00 PM

## 2022-05-06 NOTE — Addendum Note (Signed)
Encounter addended by: Theresia Bough, CMA on: 05/06/2022 2:31 PM  Actions taken: Clinical Note Signed, Order list changed

## 2022-05-07 NOTE — Progress Notes (Signed)
Electrophysiology Office Note Date: 05/08/2022  ID:  [name], DOB [birth year], MRN [id number]  PCP: Cipriano Mile, NP Primary Cardiologist: None Electrophysiologist: Cristopher Peru, MD   CC: Routine ICD follow-up  [name] is a 56 y.o. male seen today for Cristopher Peru, MD for routine electrophysiology followup. Since last being seen in our clinic the patient reports doing OK from a device perspective. Recent syncope and was evaluated in HF clinic. No events on monitor. Felt 2/2 vasovagal vs hydralazine vs ETOH use. Edema well controlled.   He has not had ICD shocks.   Device History: Medtronic BiV ICD implanted 06/2013, gen change 12/2020 for CHF, h/o VT  Past Medical History:  Diagnosis Date   Chronic systolic heart failure ([place])    a. Echo 12/05/11:  EF 20-25%, diff HK with mild sparing of IL wall, mild AI, mod MR, mild LAE, mild RVE, mild reduced RVF.;  b.  Echo 05/11/2012:  Mild LVH, EF 20-25%, Gr 1 diast dysfn, mod MR, mild LAE   Depression    G6PD deficiency    GERD (gastroesophageal reflux disease)    Hepatitis    Hep B and Hep C (patient does not report this but these are listed in previous notes.)   HIV infection ([place])    Hypertension    NICM (nonischemic cardiomyopathy) ([place])    cardia CTA 8/13 negative for obstructive CAD   Presence of permanent cardiac pacemaker    VT (ventricular tachycardia) ([place])    Past Surgical History:  Procedure Laterality Date   BI-VENTRICULAR PACEMAKER INSERTION N/A 06/26/2013   Procedure: BI-VENTRICULAR PACEMAKER INSERTION (CRT-P);  Surgeon: Evans Lance, MD;  Location[facility] C[facility] LAB;  Service: Cardiovascular;  Laterality: N/A;   BIV ICD GENERATOR CHANGEOUT N/A 01/24/2021   Procedure: BIV ICD GENERATOR CHANGEOUT;  Surgeon: Evans Lance, MD;  Location: [facility] CV LAB;  Service: Cardiovascular;  Laterality: N/A;   COLONOSCOPY WITH PROPOFOL N/A 08/14/2016   Procedure: COLONOSCOPY WITH PROPOFOL;  Surgeon: Carol Ada, MD;   Location: [facility] ENDOSCOPY;  Service: Endoscopy;  Laterality: N/A;   ELECTROPHYSIOLOGY STUDY N/A 02/19/2012   Procedure: ELECTROPHYSIOLOGY STUDY;  Surgeon: Evans Lance, MD;  Location: [facility] CATH LAB;  Service: Cardiovascular;  Laterality: N/A;   ESOPHAGOGASTRODUODENOSCOPY  12/07/2011   Procedure: ESOPHAGOGASTRODUODENOSCOPY (EGD);  Surgeon: Ladene Artist, MD,FACG;  Location: [facility] ENDOSCOPY;  Service: Endoscopy;  Laterality: N/A;   FINGER SURGERY     Thumb laceration.     HARDWARE REMOVAL Right 07/01/2018   Procedure: HARDWARE REMOVAL;  Surgeon: Renette Butters, MD;  Location: [place];  Service: Orthopedics;  Laterality: Right;   INCISION AND DRAINAGE Right 07/01/2018   Procedure: INCISION AND DRAINAGE;  Surgeon: Renette Butters, MD;  Location: [place];  Service: Orthopedics;  Laterality: Right;   LEFT HEART CATHETERIZATION WITH CORONARY ANGIOGRAM N/A 01/31/2014   Procedure: LEFT HEART CATHETERIZATION WITH CORONARY ANGIOGRAM;  Surgeon: Wellington Hampshire, MD;  Location: [facility] CATH LAB;  Service: Cardiovascular;  Laterality: N/A;   ORIF ANKLE FRACTURE Right 01/26/2017   Procedure: OPEN REDUCTION INTERNAL FIXATION (ORIF) ANKLE FRACTURE;  Surgeon: Renette Butters, MD;  Location: [facility];  Service: Orthopedics;  Laterality: Right;    Current Outpatient Medications  Medication Sig Dispense Refill   acetaminophen (TYLENOL) 325 MG tablet Take 2 tablets (650 mg total) by mouth every 4 (four) hours as needed for mild pain (or temp > 37.5 C (99.5 F)).  allopurinol (ZYLOPRIM) 100 MG tablet TAKE 1 TABLET(100 MG) BY MOUTH TWICE DAILY 180 tablet 3   amiodarone (PACERONE) 200 MG tablet Take 0.5 tablets (100 mg total) by mouth daily. 45 tablet 3   aspirin EC 81 MG tablet Take 1 tablet (81 mg total) by mouth daily. Swallow whole. 30 tablet 12   carvedilol (COREG) 25 MG tablet Take 1 tablet (25 mg total) by mouth 2 (two) times daily with a meal. 180 tablet 3   cholecalciferol (VITAMIN  D) 25 MCG (1000 UNIT) tablet Take 1,000 Units by mouth daily.     colchicine 0.6 MG tablet Take 0.6 mg by mouth 2 (two) times daily.     diclofenac sodium (VOLTAREN) 1 % GEL Apply 2 g topically 4 (four) times daily. 100 g 11   dolutegravir (TIVICAY) 50 MG tablet TAKE 1 TABLET BY MOUTH DAILY 30 tablet 5   empagliflozin (JARDIANCE) 10 MG TABS tablet Take 1 tablet (10 mg total) by mouth daily. 30 tablet 11   emtricitabine-rilpivir-tenofovir AF (ODEFSEY) 200-25-25 MG TABS tablet Take 1 tablet by mouth daily. 30 tablet 5   fluticasone (FLONASE) 50 MCG/ACT nasal spray SHAKE LIQUID AND USE 2 SPRAYS IN EACH NOSTRIL DAILY 16 g 6   furosemide (LASIX) 20 MG tablet Take 1 tablet (20 mg total) by mouth daily. 30 tablet 8   gabapentin (NEURONTIN) 300 MG capsule Take 300 mg by mouth 3 (three) times daily.     Multiple Vitamins-Minerals (CENTRUM SILVER 50+MEN PO) Take 1 tablet by mouth daily.     Omega-3 Fatty Acids (FISH OIL) 1000 MG CPDR Take 1,000 mg by mouth daily.     ondansetron (ZOFRAN) 4 MG tablet Take 4 mg by mouth every 8 (eight) hours as needed for nausea or vomiting.  0   potassium chloride SA (KLOR-CON M) 20 MEQ tablet Take 1 tablet (20 mEq total) by mouth daily. 30 tablet 8   rosuvastatin (CRESTOR) 20 MG tablet Take 0.5 tablets (10 mg total) by mouth daily. 30 tablet 11   sacubitril-valsartan (ENTRESTO) 97-103 MG Take 1 tablet by mouth 2 (two) times daily. 180 tablet 3   spironolactone (ALDACTONE) 25 MG tablet Take 1 tablet (25 mg total) by mouth daily. 90 tablet 3   traZODone (DESYREL) 50 MG tablet TAKE 1 TABLET(50 MG) BY MOUTH AT BEDTIME 30 tablet 2   triamcinolone ointment (KENALOG) 0.1 % Apply topically.     No current facility-administered medications for this visit.    Allergies:   Bactrim and Sulfamethoxazole-trimethoprim   Social History: Social History   Socioeconomic History   Marital status: Single    Spouse name: Not on file   Number of children: Not on file   Years of  education: Not on file   Highest education level: Not on file  Occupational History   Occupation: Unemployed  Tobacco Use   Smoking status: Never   Smokeless tobacco: Never  Vaping Use   Vaping Use: Never used  Substance and Sexual Activity   Alcohol use: Yes    Alcohol/week: 1.0 standard drink of alcohol    Types: 1 Cans of beer per week    Comment: occ beer   Drug use: Not Currently    Frequency: 7.0 times per week    Types: Marijuana    Comment: 1x/week, smoked day before surgery   Sexual activity: Yes    Partners: Male    Comment: declined condoms  Other Topics Concern   Not on file  Social History Narrative  Lives alone.  Drinks beer daily.  Liquor rarely.  He occasionally smokes marijuana..   Social Determinants of Health   Financial Resource Strain: Not on file  Food Insecurity: Not on file  Transportation Needs: Not on file  Physical Activity: Not on file  Stress: Not on file  Social Connections: Not on file  Intimate Partner Violence: Not on file    Family History: Family History  Problem Relation Age of Onset   Lung cancer Mother    Colon cancer Other 50    Review of Systems: All other systems reviewed and are otherwise negative except as noted above.   Physical Exam: Vitals:   05/08/22 1016  BP: 108/70  Pulse: 64  SpO2: 96%  Weight: 177 lb (80.3 kg)  Height: 5' 4.5" (1.638 m)     GEN- The patient is well appearing, alert and oriented x 3 today.   HEENT: normocephalic, atraumatic; sclera clear, conjunctiva pink; hearing intact; oropharynx clear; neck supple, no JVP Lymph- no cervical lymphadenopathy Lungs- Clear to ausculation bilaterally, normal work of breathing.  No wheezes, rales, rhonchi Heart- Regular  rate and rhythm, no murmurs, rubs or gallops, PMI not laterally displaced GI- soft, non-tender, non-distended, bowel sounds present, no hepatosplenomegaly Extremities- no clubbing or cyanosis. Trace peripheral edema; DP/PT/radial pulses  2+ bilaterally MS- no significant deformity or atrophy Skin- warm and dry, no rash or lesion; ICD pocket well healed Psych- euthymic mood, full affect Neuro- strength and sensation are intact  ICD interrogation- reviewed in detail today,  See PACEART report  EKG:  EKG is not ordered today. Personal review of EKG ordered  04/21/2022  shows AS-VP 69 bpm  Recent Labs: 04/21/2022: ALT 25; B Natriuretic Peptide 83.0; BUN 20; Creatinine, Ser 1.12; Hemoglobin 13.6; Platelets 205; Potassium 3.6; Sodium 142; TSH 2.035   Wt Readings from Last 3 Encounters:  05/08/22 177 lb (80.3 kg)  05/06/22 175 lb (79.4 kg)  04/21/22 179 lb 3.2 oz (81.3 kg)     Other studies Reviewed: Additional studies/ records that were reviewed today include: Previous EP office notes.   Assessment and Plan:  1.  Chronic systolic dysfunction s/p Medtronic CRT-D  euvolemic today Stable on an appropriate medical regimen Normal ICD function See Pace Art report No changes today EF 50-55% 11/2021  2. Ho VT Continue amiodarone 100 mg daily Surveillance labs stable 04/21/2022  3. HTN Has had some instability recently and hydralazine stopped by HF team.   4. ETOH use Continues to drink 2 beers daily. States no more, and no plans to drink any less.  Current medicines are reviewed at length with the patient today.    Labs/ tests ordered today include:  Orders Placed This Encounter  Procedures   CUP PACEART INCLINIC DEVICE CHECK    Disposition:   Follow up with Dr. Ladona Ridgel in 12 months  Signed, Graciella Freer, PA-C  05/08/2022 10:25 AM  [facility] [address] [place] [address] [phone] (office) [phone] (fax)

## 2022-05-08 ENCOUNTER — Ambulatory Visit: Payer: Medicare Other | Attending: Student | Admitting: Student

## 2022-05-08 ENCOUNTER — Encounter: Payer: Self-pay | Admitting: Student

## 2022-05-08 VITALS — BP 108/70 | HR 64 | Ht 64.5 in | Wt 177.0 lb

## 2022-05-08 DIAGNOSIS — I5022 Chronic systolic (congestive) heart failure: Secondary | ICD-10-CM | POA: Diagnosis not present

## 2022-05-08 DIAGNOSIS — I472 Ventricular tachycardia, unspecified: Secondary | ICD-10-CM

## 2022-05-08 DIAGNOSIS — I10 Essential (primary) hypertension: Secondary | ICD-10-CM

## 2022-05-08 LAB — CUP PACEART INCLINIC DEVICE CHECK
Battery Remaining Longevity: 96 mo
Battery Voltage: 2.99 V
Brady Statistic AP VP Percent: 0.18 %
Brady Statistic AP VS Percent: 0.03 %
Brady Statistic AS VP Percent: 98.31 %
Brady Statistic AS VS Percent: 1.49 %
Brady Statistic RA Percent Paced: 0.21 %
Brady Statistic RV Percent Paced: 0.11 %
Date Time Interrogation Session: 20231208104041
HighPow Impedance: 82 Ohm
Implantable Lead Connection Status: 753985
Implantable Lead Connection Status: 753985
Implantable Lead Connection Status: 753985
Implantable Lead Implant Date: 20150126
Implantable Lead Implant Date: 20150126
Implantable Lead Implant Date: 20150126
Implantable Lead Location: 753858
Implantable Lead Location: 753859
Implantable Lead Location: 753860
Implantable Lead Model: 4396
Implantable Lead Model: 5076
Implantable Lead Model: 6935
Implantable Pulse Generator Implant Date: 20220826
Lead Channel Impedance Value: 1045 Ohm
Lead Channel Impedance Value: 342 Ohm
Lead Channel Impedance Value: 380 Ohm
Lead Channel Impedance Value: 437 Ohm
Lead Channel Impedance Value: 494 Ohm
Lead Channel Impedance Value: 779 Ohm
Lead Channel Pacing Threshold Amplitude: 0.625 V
Lead Channel Pacing Threshold Amplitude: 1 V
Lead Channel Pacing Threshold Amplitude: 1.375 V
Lead Channel Pacing Threshold Pulse Width: 0.4 ms
Lead Channel Pacing Threshold Pulse Width: 0.4 ms
Lead Channel Pacing Threshold Pulse Width: 0.4 ms
Lead Channel Sensing Intrinsic Amplitude: 1 mV
Lead Channel Sensing Intrinsic Amplitude: 1.25 mV
Lead Channel Sensing Intrinsic Amplitude: 10.5 mV
Lead Channel Sensing Intrinsic Amplitude: 8.5 mV
Lead Channel Setting Pacing Amplitude: 1.5 V
Lead Channel Setting Pacing Amplitude: 2 V
Lead Channel Setting Pacing Amplitude: 2.5 V
Lead Channel Setting Pacing Pulse Width: 0.4 ms
Lead Channel Setting Pacing Pulse Width: 0.4 ms
Lead Channel Setting Sensing Sensitivity: 0.3 mV
Zone Setting Status: 755011
Zone Setting Status: 755011

## 2022-05-08 NOTE — Patient Instructions (Addendum)
Medication Instructions:  Your physician recommends that you continue on your current medications as directed. Please refer to the Current Medication list given to you today.  *If you need a refill on your cardiac medications before your next appointment, please call your pharmacy*  Lab Work: None ordered.  If you have labs (blood work) drawn today and your tests are completely normal, you will receive your results only by: MyChart Message (if you have MyChart) OR A paper copy in the mail If you have any lab test that is abnormal or we need to change your treatment, we will call you to review the results.  Testing/Procedures: None ordered.  Follow-Up: At [facility], you and your health needs are our priority.  As part of our continuing mission to provide you with exceptional heart care, we have created designated Provider Care Teams.  These Care Teams include your primary Cardiologist (physician) and Advanced Practice Providers (APPs -  Physician Assistants and Nurse Practitioners) who all work together to provide you with the care you need, when you need it.  We recommend signing up for the patient portal called "MyChart".  Sign up information is provided on this After Visit Summary.  MyChart is used to connect with patients for Virtual Visits (Telemedicine).  Patients are able to view lab/test results, encounter notes, upcoming appointments, etc.  Non-urgent messages can be sent to your provider as well.   To learn more about what you can do with MyChart, go to [email or web address].    Your next appointment:   Please schedule a 1 year follow up appointment with Dr. Ladona Ridgel  The format for your next appointment:   In Person  Provider:   [facility]" Lanna Poche, PA-C  Remote monitoring is used to monitor your ICD from home. This monitoring reduces the number of office visits required to check your device to one time per year. It allows [place] to keep an eye on the functioning of your  device to ensure it is working properly. You are scheduled for a device check from home on 07/28/22. You may send your transmission at any time that day. If you have a wireless device, the transmission will be sent automatically. After your physician reviews your transmission, you will receive a postcard with your next transmission date.  Important Information About Sugar

## 2022-05-18 NOTE — Addendum Note (Signed)
Encounter addended by: Crissie Figures, RN on: 05/18/2022 11:21 AM  Actions taken: Imaging Exam ended

## 2022-05-19 ENCOUNTER — Other Ambulatory Visit: Payer: Self-pay

## 2022-05-19 ENCOUNTER — Encounter: Payer: Self-pay | Admitting: Internal Medicine

## 2022-05-19 ENCOUNTER — Ambulatory Visit (INDEPENDENT_AMBULATORY_CARE_PROVIDER_SITE_OTHER): Payer: Medicare Other | Admitting: Internal Medicine

## 2022-05-19 ENCOUNTER — Other Ambulatory Visit (HOSPITAL_COMMUNITY)
Admission: RE | Admit: 2022-05-19 | Discharge: 2022-05-19 | Disposition: A | Discharge status: Home / Self Care | Payer: Medicare Other | Source: Ambulatory Visit | Attending: Internal Medicine | Admitting: Internal Medicine

## 2022-05-19 VITALS — BP 134/89 | HR 62 | Resp 16 | Ht 64.5 in | Wt 175.0 lb

## 2022-05-19 DIAGNOSIS — B20 Human immunodeficiency virus [HIV] disease: Secondary | ICD-10-CM

## 2022-05-19 DIAGNOSIS — Z21 Asymptomatic human immunodeficiency virus [HIV] infection status: Secondary | ICD-10-CM | POA: Diagnosis present

## 2022-05-19 DIAGNOSIS — Z113 Encounter for screening for infections with a predominantly sexual mode of transmission: Secondary | ICD-10-CM | POA: Diagnosis present

## 2022-05-19 DIAGNOSIS — Z23 Encounter for immunization: Secondary | ICD-10-CM

## 2022-05-19 DIAGNOSIS — K7460 Unspecified cirrhosis of liver: Secondary | ICD-10-CM | POA: Diagnosis not present

## 2022-05-19 MED ORDER — TIVICAY 50 MG PO TABS: ORAL_TABLET | ORAL | 11 refills | Status: DC

## 2022-05-19 MED ORDER — ODEFSEY 200-25-25 MG PO TABS: 1.0000 | ORAL_TABLET | Freq: Every day | ORAL | 11 refills | Status: DC

## 2022-05-19 NOTE — Assessment & Plan Note (Signed)
Monitored by [facility] and appreciate their involvement

## 2022-05-19 NOTE — Assessment & Plan Note (Addendum)
Will screen Condoms provided 

## 2022-05-19 NOTE — Assessment & Plan Note (Signed)
Discussed Prevnar-20 and given today °

## 2022-05-19 NOTE — Progress Notes (Signed)
   Subjective:    Patient ID: [name], male    DOB: 1965/07/19, 56 y.o.   MRN: [id number]  HPI [name] is here for follow up of HIV He continues on Tivicay and Odefsey and denies any missed doses.  Since last visit he had a fall from hypotension and now is off of hydralazine.  He otherwise has no complaints.  He denies any recent sexual activity.     Review of Systems  Constitutional:  Negative for fatigue.  Gastrointestinal:  Negative for diarrhea and nausea.  Skin:  Negative for rash.       Objective:   Physical Exam Eyes:     General: No scleral icterus. Pulmonary:     Effort: Pulmonary effort is normal.  Neurological:     General: No focal deficit present.     Mental Status: He is alert.   SH; continues to drink beer        Assessment & Plan:

## 2022-05-19 NOTE — Assessment & Plan Note (Signed)
He is doing well and wil confirm with labs today.  Refills provided and he will return in 6 months.

## 2022-05-20 LAB — URINE CYTOLOGY ANCILLARY ONLY
Chlamydia: NEGATIVE
Comment: NEGATIVE
Comment: NORMAL
Neisseria Gonorrhea: NEGATIVE

## 2022-05-20 LAB — T-HELPER CELL (CD4) - (RCID CLINIC ONLY)
CD4 % Helper T Cell: 32 % — ABNORMAL LOW (ref 33–65)
CD4 T Cell Abs: 826 /uL (ref 400–1790)

## 2022-05-21 LAB — RPR: RPR Ser Ql: NONREACTIVE

## 2022-05-21 LAB — HIV-1 RNA QUANT-NO REFLEX-BLD
HIV 1 RNA Quant: NOT DETECTED Copies/mL
HIV-1 RNA Quant, Log: NOT DETECTED Log cps/mL

## 2022-05-27 NOTE — Progress Notes (Signed)
Remote ICD transmission.   

## 2022-06-02 ENCOUNTER — Other Ambulatory Visit: Payer: Self-pay | Admitting: Radiology

## 2022-06-02 ENCOUNTER — Other Ambulatory Visit: Payer: Self-pay | Admitting: Orthopedic Surgery

## 2022-06-02 ENCOUNTER — Telehealth: Payer: Self-pay | Admitting: Radiology

## 2022-06-02 NOTE — Telephone Encounter (Signed)
Patient called Triage line---He states that the last rx of Colchicine was sent in was not for 2 tablets daily, so when he went to get his refill it is showing that it is to early to refill. He would like for our office to contact his pharmacy to get this corrected.

## 2022-06-02 NOTE — Telephone Encounter (Signed)
Can you please call the pt and advise that he needs appt first. Last OV was in April and has not had lab work in a year. We can see him at the next available spot [facility] or [place]. Thanks!

## 2022-06-03 ENCOUNTER — Other Ambulatory Visit: Payer: Self-pay | Admitting: Internal Medicine

## 2022-06-03 DIAGNOSIS — B20 Human immunodeficiency virus [HIV] disease: Secondary | ICD-10-CM

## 2022-06-08 ENCOUNTER — Other Ambulatory Visit: Payer: Self-pay | Admitting: Orthopedic Surgery

## 2022-06-08 ENCOUNTER — Other Ambulatory Visit: Payer: Self-pay

## 2022-06-08 MED ORDER — ALLOPURINOL 100 MG PO TABS: ORAL_TABLET | ORAL | 0 refills | Status: DC

## 2022-06-08 MED ORDER — COLCHICINE 0.6 MG PO TABS: 0.6000 mg | ORAL_TABLET | Freq: Two times a day (BID) | ORAL | 0 refills | Status: DC

## 2022-06-12 ENCOUNTER — Encounter: Payer: Self-pay | Admitting: Family

## 2022-06-12 ENCOUNTER — Ambulatory Visit (INDEPENDENT_AMBULATORY_CARE_PROVIDER_SITE_OTHER): Payer: Medicare Other | Admitting: Family

## 2022-06-12 DIAGNOSIS — M10071 Idiopathic gout, right ankle and foot: Secondary | ICD-10-CM | POA: Diagnosis not present

## 2022-06-12 MED ORDER — ALLOPURINOL 100 MG PO TABS: ORAL_TABLET | ORAL | 3 refills | Status: DC

## 2022-06-12 MED ORDER — COLCHICINE 0.6 MG PO TABS: 0.6000 mg | ORAL_TABLET | Freq: Two times a day (BID) | ORAL | 5 refills | Status: DC

## 2022-06-12 NOTE — Progress Notes (Signed)
Office Visit Note   Patient: [name]           Date of Birth: Oct 27, 1965           MRN: [id number] Visit Date: 06/12/2022              Requested by: Hillery Aldo, NP [address] [place],  [place] [address] PCP: Hillery Aldo, NP  Chief Complaint  Patient presents with   Right Knee - Follow-up      HPI: The patient is a 57 year old gentleman who presents today concern of gout flare to his right knee.  States he has been on allopurinol twice daily as well as "just seen twice daily for the last several years unfortunately his prescriptions ran out.  He had not been seen in the office in over a year.  Feels that the lack of colchicine precipitated his most recent flare that has been ongoing for the last 7 days primarily pain and swelling in the knee difficulty weightbearing due to pain  No recent injury.  Assessment & Plan: Visit Diagnoses:  1. Acute idiopathic gout of right ankle     Plan: Has had recent liver function testing which was all within normal limits will draw uric acid today.  Refill his medications.  Discussed following up in 1 year for uric acid check  Follow-Up Instructions: No follow-ups on file.   Right Knee Exam   Muscle Strength  The patient has normal right knee strength.  Tenderness  Right knee tenderness location: Global.  Range of Motion  The patient has normal right knee ROM.  Other  Erythema: absent Swelling: mild      Patient is alert, oriented, no adenopathy, well-dressed, normal affect, normal respiratory effort.   Imaging: No results found. No images are attached to the encounter.  Labs: Lab Results  Component Value Date   HGBA1C 5.0 12/13/2021   HGBA1C 4.4 (L) 01/26/2017   HGBA1C 5.8 (H) 07/27/2012   ESRSEDRATE 8 06/10/2010   LABURIC 7.0 03/27/2021   REPTSTATUS 03/31/2021 FINAL 03/26/2021   GRAMSTAIN  07/01/2018    FEW WBC PRESENT,BOTH PMN AND MONONUCLEAR NO ORGANISMS SEEN    CULT  03/26/2021    NO GROWTH  5 DAYS Performed at [facility], [address] [place], [place] [address]      Lab Results  Component Value Date   ALBUMIN 4.1 04/21/2022   ALBUMIN 4.1 12/12/2021   ALBUMIN 5.0 (H) 07/03/2021    Lab Results  Component Value Date   MG 2.2 03/29/2021   MG 2.0 01/26/2017   MG 2.1 03/23/2014   Lab Results  Component Value Date   VD25OH 22.6 (L) 01/26/2017    No results found for: "PREALBUMIN"    Latest Ref Rng & Units 04/21/2022    3:17 PM 01/09/2022    3:40 PM 12/12/2021    6:36 PM  CBC EXTENDED  WBC 4.0 - 10.5 K/uL 9.7  8.8  9.4   RBC 4.22 - 5.81 MIL/uL 4.40  4.62  4.59   Hemoglobin 13.0 - 17.0 g/dL 30.1  60.1  09.3   HCT 39.0 - 52.0 % 41.3  41.9  42.2   Platelets 150 - 400 K/uL 205  190  187   NEUT# 1,500 - 7,800 cells/uL  5,509  6.1   Lymph# 850 - 3,900 cells/uL  2,490  2.4      There is no height or weight on file to calculate BMI.  Orders:  Orders  Placed This Encounter  Procedures   Uric acid   Meds ordered this encounter  Medications   allopurinol (ZYLOPRIM) 100 MG tablet    Sig: TAKE 1 TABLET(100 MG) BY MOUTH TWICE DAILY    Dispense:  90 tablet    Refill:  3   colchicine 0.6 MG tablet    Sig: Take 1 tablet (0.6 mg total) by mouth 2 (two) times daily.    Dispense:  120 tablet    Refill:  5    **Patient requests 90 days supply**     Procedures: No procedures performed  Clinical Data: No additional findings.  ROS:  All other systems negative, except as noted in the HPI. Review of Systems  Constitutional: Negative.   Musculoskeletal:  Positive for arthralgias, gait problem and joint swelling.    Objective: Vital Signs: There were no vitals taken for this visit.  Specialty Comments:  No specialty comments available.  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   Blurred vision 12/12/2021   Encounter for screening examination for sexually transmitted disease 05/05/2021    Traumatic arthritis of right ankle    Acute idiopathic gout involving toe of right foot    Septic arthritis of right ankle ([place]) 03/26/2021   Mixed hyperlipidemia 11/26/2019   Cirrhosis ([place]) 05/15/2019   Need for prophylactic vaccination against Streptococcus pneumoniae (pneumococcus) 04/25/2018   Neurodermatitis 09/30/2017   Nonischemic cardiomyopathy ([place])    HIV (human immunodeficiency virus infection) ([place]) 01/25/2017   Insomnia 06/26/2014   PTSD (post-traumatic stress disorder) 02/03/2014   ICD (implantable cardioverter-defibrillator), biventricular, in situ 01/23/2014   Paroxysmal ventricular fibrillation ([place]) 01/19/2014   Protein-calorie malnutrition, severe ([place]) 02/13/2013   GERD (gastroesophageal reflux disease) 50/53/9767   Chronic systolic heart failure ([place]) 01/25/2012   Alcohol abuse 11/30/2011   Genital herpes 06/09/2006   Chronic hepatitis C without hepatic coma ([place]) 06/09/2006   Primary hypertension 06/09/2006   Past Medical History:  Diagnosis Date   Chronic systolic heart failure ([place])    a. Echo 12/05/11:  EF 20-25%, diff HK with mild sparing of IL wall, mild AI, mod MR, mild LAE, mild RVE, mild reduced RVF.;  b.  Echo 05/11/2012:  Mild LVH, EF 20-25%, Gr 1 diast dysfn, mod MR, mild LAE   Depression    G6PD deficiency    GERD (gastroesophageal reflux disease)    Hepatitis    Hep B and Hep C (patient does not report this but these are listed in previous notes.)   HIV infection ([place])    Hypertension    NICM (nonischemic cardiomyopathy) ([place])    cardia CTA 8/13 negative for obstructive CAD   Presence of permanent cardiac pacemaker    VT (ventricular tachycardia) ([place])     Family History  Problem Relation Age of Onset   Lung cancer Mother    Colon cancer Other 49    Past Surgical History:  Procedure Laterality Date   BI-VENTRICULAR PACEMAKER INSERTION N/A 06/26/2013   Procedure: BI-VENTRICULAR PACEMAKER INSERTION (CRT-P);  Surgeon: Evans Lance, MD;   Location: [facility] CATH LAB;  Service: Cardiovascular;  Laterality: N/A;   BIV ICD GENERATOR CHANGEOUT N/A 01/24/2021   Procedure: BIV ICD GENERATOR CHANGEOUT;  Surgeon: Evans Lance, MD;  Location: [place] CV LAB;  Service: Cardiovascular;  Laterality: N/A;   COLONOSCOPY WITH PROPOFOL N/A 08/14/2016   Procedure: COLONOSCOPY WITH PROPOFOL;  Surgeon: Carol Ada, MD;  Location: [facility] ENDOSCOPY;  Service: Endoscopy;  Laterality: N/A;   ELECTROPHYSIOLOGY STUDY N/A 02/19/2012   Procedure: ELECTROPHYSIOLOGY STUDY;  Surgeon: Evans Lance, MD;  Location: [facility] CATH LAB;  Service: Cardiovascular;  Laterality: N/A;   ESOPHAGOGASTRODUODENOSCOPY  12/07/2011   Procedure: ESOPHAGOGASTRODUODENOSCOPY (EGD);  Surgeon: Ladene Artist, MD,FACG;  Location: [facility] ENDOSCOPY;  Service: Endoscopy;  Laterality: N/A;   FINGER SURGERY     Thumb laceration.     HARDWARE REMOVAL Right 07/01/2018   Procedure: HARDWARE REMOVAL;  Surgeon: Renette Butters, MD;  Location: [facility];  Service: Orthopedics;  Laterality: Right;   INCISION AND DRAINAGE Right 07/01/2018   Procedure: INCISION AND DRAINAGE;  Surgeon: Renette Butters, MD;  Location: [facility];  Service: Orthopedics;  Laterality: Right;   LEFT HEART CATHETERIZATION WITH CORONARY ANGIOGRAM N/A 01/31/2014   Procedure: LEFT HEART CATHETERIZATION WITH CORONARY ANGIOGRAM;  Surgeon: Wellington Hampshire, MD;  Location: [facility] CATH LAB;  Service: Cardiovascular;  Laterality: N/A;   ORIF ANKLE FRACTURE Right 01/26/2017   Procedure: OPEN REDUCTION INTERNAL FIXATION (ORIF) ANKLE FRACTURE;  Surgeon: Renette Butters, MD;  Location: [place];  Service: Orthopedics;  Laterality: Right;   Social History   Occupational History   Occupation: Unemployed  Tobacco Use   Smoking status: Never   Smokeless tobacco: Never  Vaping Use   Vaping Use: Never used  Substance and Sexual Activity   Alcohol use: Yes    Alcohol/week: 1.0 standard drink of alcohol    Types: 1 Cans  of beer per week    Comment: occ beer   Drug use: Not Currently    Frequency: 7.0 times per week    Types: Marijuana    Comment: 1x/week, smoked day before surgery   Sexual activity: Yes    Partners: Male    Comment: declined condoms

## 2022-06-13 LAB — URIC ACID: Uric Acid, Serum: 4.4 mg/dL (ref 4.0–8.0)

## 2022-06-16 NOTE — Progress Notes (Signed)
Uric acid within normal limits. Continue allopurinol and colchicine as prescribed

## 2022-06-19 ENCOUNTER — Encounter: Payer: Self-pay | Admitting: Family

## 2022-07-03 ENCOUNTER — Other Ambulatory Visit (HOSPITAL_COMMUNITY): Payer: Self-pay | Admitting: Internal Medicine

## 2022-07-21 ENCOUNTER — Telehealth: Payer: Self-pay

## 2022-07-21 NOTE — Telephone Encounter (Signed)
Patient called office requesting to know what days Dr. Linus Salmons will be in office to complete forms for parking placard. Advised patient that he can drop off form and provider can fill it out and office would call once completed. Patient would prefer to have form completed while he is in office.  Informed him of office policy and that forms can take up to two weeks before they are completed. Patient requested appointment so this can be done while he is in office. Scheduled for 3/5  Leatrice Jewels, RMA

## 2022-07-28 ENCOUNTER — Ambulatory Visit: Payer: 59

## 2022-07-28 DIAGNOSIS — I428 Other cardiomyopathies: Secondary | ICD-10-CM | POA: Diagnosis not present

## 2022-07-29 LAB — CUP PACEART REMOTE DEVICE CHECK
Battery Remaining Longevity: 89 mo
Battery Voltage: 2.99 V
Brady Statistic AP VP Percent: 0.12 %
Brady Statistic AP VS Percent: 0.02 %
Brady Statistic AS VP Percent: 98.45 %
Brady Statistic AS VS Percent: 1.41 %
Brady Statistic RA Percent Paced: 0.14 %
Brady Statistic RV Percent Paced: 0.47 %
Date Time Interrogation Session: 20240227022726
HighPow Impedance: 86 Ohm
Implantable Lead Connection Status: 753985
Implantable Lead Connection Status: 753985
Implantable Lead Connection Status: 753985
Implantable Lead Implant Date: 20150126
Implantable Lead Implant Date: 20150126
Implantable Lead Implant Date: 20150126
Implantable Lead Location: 753858
Implantable Lead Location: 753859
Implantable Lead Location: 753860
Implantable Lead Model: 4396
Implantable Lead Model: 5076
Implantable Lead Model: 6935
Implantable Pulse Generator Implant Date: 20220826
Lead Channel Impedance Value: 323 Ohm
Lead Channel Impedance Value: 380 Ohm
Lead Channel Impedance Value: 380 Ohm
Lead Channel Impedance Value: 456 Ohm
Lead Channel Impedance Value: 703 Ohm
Lead Channel Impedance Value: 950 Ohm
Lead Channel Pacing Threshold Amplitude: 0.625 V
Lead Channel Pacing Threshold Amplitude: 0.875 V
Lead Channel Pacing Threshold Amplitude: 0.875 V
Lead Channel Pacing Threshold Pulse Width: 0.4 ms
Lead Channel Pacing Threshold Pulse Width: 0.4 ms
Lead Channel Pacing Threshold Pulse Width: 0.4 ms
Lead Channel Sensing Intrinsic Amplitude: 1.25 mV
Lead Channel Sensing Intrinsic Amplitude: 1.25 mV
Lead Channel Sensing Intrinsic Amplitude: 7.875 mV
Lead Channel Sensing Intrinsic Amplitude: 7.875 mV
Lead Channel Setting Pacing Amplitude: 1.5 V
Lead Channel Setting Pacing Amplitude: 2 V
Lead Channel Setting Pacing Amplitude: 2.5 V
Lead Channel Setting Pacing Pulse Width: 0.4 ms
Lead Channel Setting Pacing Pulse Width: 0.4 ms
Lead Channel Setting Sensing Sensitivity: 0.3 mV
Zone Setting Status: 755011
Zone Setting Status: 755011

## 2022-08-04 ENCOUNTER — Encounter: Payer: Self-pay | Admitting: Internal Medicine

## 2022-08-04 ENCOUNTER — Ambulatory Visit (INDEPENDENT_AMBULATORY_CARE_PROVIDER_SITE_OTHER): Payer: 59 | Admitting: Internal Medicine

## 2022-08-04 ENCOUNTER — Other Ambulatory Visit: Payer: Self-pay

## 2022-08-04 VITALS — BP 110/78 | HR 67 | Wt 172.0 lb

## 2022-08-04 DIAGNOSIS — I428 Other cardiomyopathies: Secondary | ICD-10-CM | POA: Diagnosis not present

## 2022-08-04 NOTE — Assessment & Plan Note (Signed)
I advised he continue taking aspirin. Placard form filled out and given back to the patient I have personally spent 20 minutes involved in face-to-face and non-face-to-face activities for this patient on the day of the visit. Professional time spent includes the following activities: Preparing to see the patient (review of tests), Obtaining and/or reviewing separately obtained history (admission/discharge record), Performing a medically appropriate examination and/or evaluation , Ordering medications/tests/procedures, referring and communicating with other health care professionals, Documenting clinical information in the EMR, Independently interpreting results (not separately reported), Communicating results to the patient/family/caregiver, Counseling and educating the patient/family/caregiver and Care coordination (not separately reported).

## 2022-08-04 NOTE — Progress Notes (Signed)
   Subjective:    Patient ID: [name], male    DOB: May 19, 1966, 57 y.o.   MRN: [id number]  HPI Here for a work in visit He has a handicap placard renewal form.  Also with some bruising he attributes to taking aspirin.     Review of Systems  Constitutional:  Negative for appetite change.       Objective:   Physical Exam Musculoskeletal:     Comments: Two areas of bruising on his left arm and one on the right arm  Neurological:     Mental Status: He is alert.           Assessment & Plan:

## 2022-08-11 ENCOUNTER — Telehealth: Payer: Self-pay | Admitting: Internal Medicine

## 2022-08-11 NOTE — Telephone Encounter (Signed)
I spoke with the patient and let him know that we did receive his transmission on 2/27 and 3/4. His monitor is automatic and will send by itself between the hours of midnight and 5 am. The pt verbalized understanding.

## 2022-08-11 NOTE — Telephone Encounter (Signed)
  1. Has your device fired? no  2. Is you device beeping? no  3. Are you experiencing draining or swelling at device site? no  4. Are you calling to see if we received your device transmission? yes  5. Have you passed out? no  Patient calling to see if his transmission was received 3/5. He says he forgot it was due in February, because he did not receive a reminder to do it. He would like to also have a list of his upcoming transmissions scheduled.    Please route to [place]

## 2022-09-02 NOTE — Progress Notes (Signed)
Remote ICD transmission.   

## 2022-10-06 ENCOUNTER — Other Ambulatory Visit (HOSPITAL_COMMUNITY): Payer: Self-pay

## 2022-10-06 MED ORDER — EMPAGLIFLOZIN 10 MG PO TABS: 10.0000 mg | ORAL_TABLET | Freq: Every day | ORAL | 11 refills | Status: DC

## 2022-10-08 ENCOUNTER — Other Ambulatory Visit: Payer: Self-pay | Admitting: Nurse Practitioner

## 2022-10-08 DIAGNOSIS — K7469 Other cirrhosis of liver: Secondary | ICD-10-CM

## 2022-10-08 DIAGNOSIS — R634 Abnormal weight loss: Secondary | ICD-10-CM

## 2022-10-12 ENCOUNTER — Telehealth: Payer: Self-pay | Admitting: Internal Medicine

## 2022-10-12 ENCOUNTER — Ambulatory Visit: Payer: 59 | Attending: Internal Medicine

## 2022-10-12 DIAGNOSIS — I5022 Chronic systolic (congestive) heart failure: Secondary | ICD-10-CM

## 2022-10-12 LAB — CUP PACEART INCLINIC DEVICE CHECK
Date Time Interrogation Session: 20240513113505
HighPow Impedance: 99 Ohm
Implantable Lead Connection Status: 753985
Implantable Lead Connection Status: 753985
Implantable Lead Connection Status: 753985
Implantable Lead Implant Date: 20150126
Implantable Lead Implant Date: 20150126
Implantable Lead Implant Date: 20150126
Implantable Lead Location: 753858
Implantable Lead Location: 753859
Implantable Lead Location: 753860
Implantable Lead Model: 4396
Implantable Lead Model: 5076
Implantable Lead Model: 6935
Implantable Pulse Generator Implant Date: 20220826
Lead Channel Impedance Value: 437 Ohm
Lead Channel Impedance Value: 494 Ohm
Lead Channel Impedance Value: 513 Ohm
Lead Channel Pacing Threshold Amplitude: 0.5 V
Lead Channel Pacing Threshold Amplitude: 0.75 V
Lead Channel Pacing Threshold Amplitude: 1.25 V
Lead Channel Pacing Threshold Pulse Width: 0.4 ms
Lead Channel Pacing Threshold Pulse Width: 0.4 ms
Lead Channel Pacing Threshold Pulse Width: 0.6 ms
Lead Channel Sensing Intrinsic Amplitude: 12.5 mV
Lead Channel Sensing Intrinsic Amplitude: 3.1 mV

## 2022-10-12 NOTE — Telephone Encounter (Signed)
The patient will be coming into the office to get his ICD interrogated. The nurse also going to give him the number to [employer] tech support so he can get his handheld replace.

## 2022-10-12 NOTE — Telephone Encounter (Signed)
Pt states that an "alarm" has been going off on his device since yesterday. Pt is concerned being that this has never happened before and would like a callback regarding this matter. Please advise

## 2022-10-12 NOTE — Progress Notes (Signed)
CRT-D device check in office due to ICD alarming due to LV adaptive on and lead placed into high output as well as RV defib lead imprdance warning ~ 99 ohms, LV threshold checked today ~ 1.25V @ 0.58ms. Output set to 2.0 V and monitor per A. Tillery. Thresholds and sensing consistent with previous device measurements. Lead impedance trends stable over time.  No mode switch episodes recorded. No ventricular arrhythmia episodes recorded. Patient bi-ventricularly pacing 98.4% of the time. Device programmed with appropriate safety margins. Heart failure diagnostics reviewed and trends are stable for patient. Audible/vibratory alerts demonstrated for patient. No changes made this session. Estimated longevity 3.2 years.  Patient enrolled in remote follow up. Plan to check device remotely in 3 months and see in office in 6 months. Patient education completed including shock plan.  Patient given new remote monitor.   Error in saving report, see scanned media for details.

## 2022-10-25 ENCOUNTER — Telehealth: Payer: Self-pay | Admitting: Physician Assistant

## 2022-10-25 NOTE — Telephone Encounter (Signed)
Returned call to patient who states he had machine alarming on 5/12 and 5/13. He received a new machine that is now alarming again. He denies shocks and generally feels well without CHF symptoms. I directed him to the company 800 number for help troubleshooting.

## 2022-10-26 ENCOUNTER — Telehealth: Payer: Self-pay | Admitting: Physician Assistant

## 2022-10-26 NOTE — Telephone Encounter (Signed)
Patient called the answering service with recurrent alarming from his device.  Chart reviewed.  He had recent adjustments made with his device on 5/13.  The patient notes that he feels fine.  He has not had chest pain, shortness of breath, syncope.  He has not had any defibrillator shocks.  I spoke with Dr. Lalla Brothers, on-call for EP.  He spoke with the [employer] rep.  It appears that his lead had another high output episode.  However, he has not had any since.  His device has continued to alarm.  Recommendation is to come into the office tomorrow for interrogation of his device.  I discussed this with the patient who agrees. Tereso Newcomer, PA-C    10/26/2022 5:02 PM

## 2022-10-27 ENCOUNTER — Ambulatory Visit: Payer: 59 | Attending: Cardiology

## 2022-10-27 ENCOUNTER — Ambulatory Visit (INDEPENDENT_AMBULATORY_CARE_PROVIDER_SITE_OTHER): Payer: 59

## 2022-10-27 DIAGNOSIS — I472 Ventricular tachycardia, unspecified: Secondary | ICD-10-CM | POA: Diagnosis not present

## 2022-10-27 DIAGNOSIS — I5022 Chronic systolic (congestive) heart failure: Secondary | ICD-10-CM

## 2022-10-27 LAB — CUP PACEART INCLINIC DEVICE CHECK
Date Time Interrogation Session: 20240528104614
Implantable Lead Connection Status: 753985
Implantable Lead Connection Status: 753985
Implantable Lead Connection Status: 753985
Implantable Lead Implant Date: 20150126
Implantable Lead Implant Date: 20150126
Implantable Lead Implant Date: 20150126
Implantable Lead Location: 753858
Implantable Lead Location: 753859
Implantable Lead Location: 753860
Implantable Lead Model: 4396
Implantable Lead Model: 5076
Implantable Lead Model: 6935
Implantable Pulse Generator Implant Date: 20220826

## 2022-10-27 LAB — CUP PACEART REMOTE DEVICE CHECK
Battery Remaining Longevity: 97 mo
Battery Voltage: 2.97 V
Brady Statistic AP VP Percent: 0.05 %
Brady Statistic AP VS Percent: 0.02 %
Brady Statistic AS VP Percent: 98.22 %
Brady Statistic AS VS Percent: 1.72 %
Brady Statistic RA Percent Paced: 0.07 %
Brady Statistic RV Percent Paced: 6.19 %
Date Time Interrogation Session: 20240527150800
HighPow Impedance: 98 Ohm
Implantable Lead Connection Status: 753985
Implantable Lead Connection Status: 753985
Implantable Lead Connection Status: 753985
Implantable Lead Implant Date: 20150126
Implantable Lead Implant Date: 20150126
Implantable Lead Implant Date: 20150126
Implantable Lead Location: 753858
Implantable Lead Location: 753859
Implantable Lead Location: 753860
Implantable Lead Model: 4396
Implantable Lead Model: 5076
Implantable Lead Model: 6935
Implantable Pulse Generator Implant Date: 20220826
Lead Channel Impedance Value: 1140 Ohm
Lead Channel Impedance Value: 323 Ohm
Lead Channel Impedance Value: 399 Ohm
Lead Channel Impedance Value: 399 Ohm
Lead Channel Impedance Value: 570 Ohm
Lead Channel Impedance Value: 817 Ohm
Lead Channel Pacing Threshold Amplitude: 0.625 V
Lead Channel Pacing Threshold Amplitude: 0.875 V
Lead Channel Pacing Threshold Amplitude: 1.5 V
Lead Channel Pacing Threshold Pulse Width: 0.4 ms
Lead Channel Pacing Threshold Pulse Width: 0.4 ms
Lead Channel Pacing Threshold Pulse Width: 0.4 ms
Lead Channel Sensing Intrinsic Amplitude: 1 mV
Lead Channel Sensing Intrinsic Amplitude: 1 mV
Lead Channel Sensing Intrinsic Amplitude: 11.875 mV
Lead Channel Sensing Intrinsic Amplitude: 11.875 mV
Lead Channel Setting Pacing Amplitude: 1.5 V
Lead Channel Setting Pacing Amplitude: 2 V
Lead Channel Setting Pacing Amplitude: 2 V
Lead Channel Setting Pacing Pulse Width: 0.4 ms
Lead Channel Setting Pacing Pulse Width: 0.4 ms
Lead Channel Setting Sensing Sensitivity: 0.3 mV
Zone Setting Status: 755011
Zone Setting Status: 755011

## 2022-10-27 NOTE — Telephone Encounter (Signed)
Pt scheduled for device clinic appt 10/27/22 at 9:30 am to evaluate ICD alarms and reprogram.

## 2022-10-27 NOTE — Progress Notes (Signed)
are out to his left, RV Defib shock impedance is > 200 ohms. Spoke to [employer] who confirmed patient was not guaranteed to receive shock if needed. Stated since sensing was Bipolar pt is not at risk for inappropriate shock. Called Dr. Ladona Ridgel, reviewed and verbal orders obtained to TURN OFF ALL SHOCK THERAPIES. All shock therapies turned off and home monitor adjusted as well to not sound high tone for therapies off. LV pulse width programmed to 0.6 ms. Patient scheduled to see Dr. Ladona Ridgel in office 10/29/22 @ 4:15 to discuss lead revision.

## 2022-10-29 ENCOUNTER — Ambulatory Visit: Payer: 59 | Attending: Internal Medicine | Admitting: Internal Medicine

## 2022-10-29 ENCOUNTER — Encounter: Payer: Self-pay | Admitting: Internal Medicine

## 2022-10-29 VITALS — BP 110/72 | HR 85 | Ht 64.5 in | Wt 167.2 lb

## 2022-10-29 DIAGNOSIS — I5022 Chronic systolic (congestive) heart failure: Secondary | ICD-10-CM | POA: Diagnosis not present

## 2022-10-29 DIAGNOSIS — Z95810 Presence of automatic (implantable) cardiac defibrillator: Secondary | ICD-10-CM | POA: Diagnosis not present

## 2022-10-29 DIAGNOSIS — I10 Essential (primary) hypertension: Secondary | ICD-10-CM

## 2022-10-29 DIAGNOSIS — I428 Other cardiomyopathies: Secondary | ICD-10-CM

## 2022-10-29 NOTE — Patient Instructions (Addendum)
Medication Instructions:  Your physician recommends that you continue on your current medications as directed. Please refer to the Current Medication list given to you today.  *If you need a refill on your cardiac medications before your next appointment, please call your pharmacy*  Lab Work: You will need to have a CBC and BMET drawn within 30 days of your procedure.  We will schedule this with you in the near future.    If you have labs (blood work) drawn today and your tests are completely normal, you will receive your results only by: MyChart Message (if you have MyChart) OR A paper copy in the mail If you have any lab test that is abnormal or we need to change your treatment, we will call you to review the results.  Testing/Procedures: None ordered.  Follow-Up: Dr. Lewayne Bunting has ordered a [employer] BiV ICD Lead extraction, and a New Lead insertion, with Anesthesia.  A TEE and type and cross, with X 2 Units of blood, and radial arterial line has been ordered.   We will be contacting you in the near future to schedule your procedure.    Provider:   Lewayne Bunting, MD{or one of the following Advanced Practice Providers on your designated Care Team:   Francis Dowse, [place] Casimiro Needle "Mardelle Matte" Lanna Poche, [place]  Remote monitoring is used to monitor your ICD from home. This monitoring reduces the number of office visits required to check your device to one time per year. It allows [place] to keep an eye on the functioning of your device to ensure it is working properly. You are scheduled for a device check from home on 8/27. You may send your transmission at any time that day. If you have a wireless device, the transmission will be sent automatically. After your physician reviews your transmission, you will receive a postcard with your next transmission date.

## 2022-10-29 NOTE — H&P (View-Only) (Signed)
    HPI [name] returns today for followup. He is a pleasant middle aged man with chronic systolic heart failure and VT, prior ETOH abuse, s/p ICD insertion. His VT has been well controlled on amiodarone 100 mg daily and with cessation of ETOH abuse. He has not had syncope. He admits to dietary indiscretion and has not been working out. He has gained weight but then has lost most of it back. He denies chest pain or sob. He was found to have an elevated shocking impedence on his ICD lead. He presents to discuss the treatment options. Allergies  Allergen Reactions   Bactrim Rash   Sulfamethoxazole-Trimethoprim Rash     Current Outpatient Medications  Medication Sig Dispense Refill   acetaminophen (TYLENOL) 325 MG tablet Take 2 tablets (650 mg total) by mouth every 4 (four) hours as needed for mild pain (or temp > 37.5 C (99.5 F)).     allopurinol (ZYLOPRIM) 100 MG tablet TAKE 1 TABLET(100 MG) BY MOUTH TWICE DAILY 90 tablet 3   amiodarone (PACERONE) 200 MG tablet Take 0.5 tablets (100 mg total) by mouth daily. 45 tablet 3   aspirin EC 81 MG tablet Take 1 tablet (81 mg total) by mouth daily. Swallow whole. 30 tablet 12   carvedilol (COREG) 25 MG tablet Take 1 tablet (25 mg total) by mouth 2 (two) times daily with a meal. 180 tablet 3   cholecalciferol (VITAMIN D) 25 MCG (1000 UNIT) tablet Take 1,000 Units by mouth daily.     colchicine 0.6 MG tablet Take 1 tablet (0.6 mg total) by mouth 2 (two) times daily. 120 tablet 5   diclofenac sodium (VOLTAREN) 1 % GEL Apply 2 g topically 4 (four) times daily. 100 g 11   dolutegravir (TIVICAY) 50 MG tablet TAKE 1 TABLET BY MOUTH DAILY 30 tablet 11   empagliflozin (JARDIANCE) 10 MG TABS tablet Take 1 tablet (10 mg total) by mouth daily. 30 tablet 11   emtricitabine-rilpivir-tenofovir AF (ODEFSEY) 200-25-25 MG TABS tablet Take 1 tablet by mouth daily. 30 tablet 11   ENTRESTO 97-103 MG TAKE 1 TABLET BY MOUTH TWICE DAILY 180 tablet 3   fluticasone  (FLONASE) 50 MCG/ACT nasal spray SHAKE LIQUID AND USE 2 SPRAYS IN EACH NOSTRIL DAILY 16 g 6   furosemide (LASIX) 20 MG tablet Take 1 tablet (20 mg total) by mouth daily. 30 tablet 8   gabapentin (NEURONTIN) 300 MG capsule Take 300 mg by mouth 3 (three) times daily.     Multiple Vitamins-Minerals (CENTRUM SILVER 50+MEN PO) Take 1 tablet by mouth daily.     Omega-3 Fatty Acids (FISH OIL) 1000 MG CPDR Take 1,000 mg by mouth daily.     ondansetron (ZOFRAN) 4 MG tablet Take 4 mg by mouth every 8 (eight) hours as needed for nausea or vomiting.  0   potassium chloride SA (KLOR-CON M) 20 MEQ tablet Take 1 tablet (20 mEq total) by mouth daily. 30 tablet 8   rosuvastatin (CRESTOR) 20 MG tablet Take 0.5 tablets (10 mg total) by mouth daily. 30 tablet 11   spironolactone (ALDACTONE) 25 MG tablet Take 1 tablet (25 mg total) by mouth daily. 90 tablet 3   traZODone (DESYREL) 50 MG tablet TAKE 1 TABLET(50 MG) BY MOUTH AT BEDTIME 30 tablet 2   triamcinolone ointment (KENALOG) 0.1 % Apply topically.     No current facility-administered medications for this visit.     Past Medical History:  Diagnosis Date   Chronic systolic heart failure (  HCC)    a. Echo 12/05/11:  EF 20-25%, diff HK with mild sparing of IL wall, mild AI, mod MR, mild LAE, mild RVE, mild reduced RVF.;  b.  Echo 05/11/2012:  Mild LVH, EF 20-25%, Gr 1 diast dysfn, mod MR, mild LAE   Depression    G6PD deficiency    GERD (gastroesophageal reflux disease)    Hepatitis    Hep B and Hep C (patient does not report this but these are listed in previous notes.)   HIV infection (HCC)    Hypertension    NICM (nonischemic cardiomyopathy) (HCC)    cardia CTA 8/13 negative for obstructive CAD   Presence of permanent cardiac pacemaker    VT (ventricular tachycardia) (HCC)     ROS:   All systems reviewed and negative except as noted in the HPI.   Past Surgical History:  Procedure Laterality Date   BI-VENTRICULAR PACEMAKER INSERTION N/A 06/26/2013    Procedure: BI-VENTRICULAR PACEMAKER INSERTION (CRT-P);  Surgeon: Lon Klippel W Denishia Citro, MD;  Location: [facility];  Service: Cardiovascular;  Laterality: N/A;   BIV ICD GENERATOR CHANGEOUT N/A 01/24/2021   Procedure: BIV ICD GENERATOR CHANGEOUT;  Surgeon: Alic Hilburn W, MD;  Location: [facility] INVASIVE CV [facility];  Service: Cardiovascular;  Laterality: N/A;   COLONOSCOPY WITH PROPOFOL N/A 08/14/2016   Procedure: COLONOSCOPY WITH PROPOFOL;  Surgeon: Patrick Hung, MD;  Location: [facility];  Service: Endoscopy;  Laterality: N/A;   ELECTROPHYSIOLOGY STUDY N/A 02/19/2012   Procedure: ELECTROPHYSIOLOGY STUDY;  Surgeon: Bret Stamour W Traylen Eckels, MD;  Location: [facility];  Service: Cardiovascular;  Laterality: N/A;   ESOPHAGOGASTRODUODENOSCOPY  12/07/2011   Procedure: ESOPHAGOGASTRODUODENOSCOPY (EGD);  Surgeon: Malcolm T Stark, MD,FACG;  Location: [facility];  Service: Endoscopy;  Laterality: N/A;   FINGER SURGERY     Thumb laceration.     HARDWARE REMOVAL Right 07/01/2018   Procedure: HARDWARE REMOVAL;  Surgeon: Murphy, Derreck D, MD;  Location: [facility];  Service: Orthopedics;  Laterality: Right;   INCISION AND DRAINAGE Right 07/01/2018   Procedure: INCISION AND DRAINAGE;  Surgeon: Murphy, Remigio D, MD;  Location: [facility];  Service: Orthopedics;  Laterality: Right;   LEFT HEART CATHETERIZATION WITH CORONARY ANGIOGRAM N/A 01/31/2014   Procedure: LEFT HEART CATHETERIZATION WITH CORONARY ANGIOGRAM;  Surgeon: Muhammad A Arida, MD;  Location: [facility];  Service: Cardiovascular;  Laterality: N/A;   ORIF ANKLE FRACTURE Right 01/26/2017   Procedure: OPEN REDUCTION INTERNAL FIXATION (ORIF) ANKLE FRACTURE;  Surgeon: Murphy, Kenzo D, MD;  Location: [facility];  Service: Orthopedics;  Laterality: Right;     Family History  Problem Relation Age of Onset   Lung cancer Mother    Colon cancer Other 50     Social History   Socioeconomic History   Marital status: Single    Spouse name: Not on  file   Number of children: Not on file   Years of education: Not on file   Highest education level: Not on file  Occupational History   Occupation: Unemployed  Tobacco Use   Smoking status: Never   Smokeless tobacco: Never  Vaping Use   Vaping Use: Never used  Substance and Sexual Activity   Alcohol use: Yes    Alcohol/week: 1.0 standard drink of alcohol    Types: 1 Cans of beer per week    Comment: occ beer   Drug use: Not Currently    Frequency: 7.0 times per week    Types: Marijuana    Comment:   1x/week, smoked day before surgery   Sexual activity: Yes    Partners: Male    Comment: declined condoms  Other Topics Concern   Not on file  Social History Narrative   Lives alone.  Drinks beer daily.  Liquor rarely.  He occasionally smokes marijuana..   Social Determinants of Health   Financial Resource Strain: Not on file  Food Insecurity: Not on file  Transportation Needs: Not on file  Physical Activity: Not on file  Stress: Not on file  Social Connections: Not on file  Intimate Partner Violence: Not on file     BP 110/72   Pulse 85   Ht 5' 4.5" (1.638 m)   Wt 167 lb 3.2 oz (75.8 kg)   SpO2 97%   BMI 28.26 kg/m   Physical Exam:  Well appearing middle aged man, NAD HEENT: Unremarkable Neck:  No JVD, no thyromegally Lymphatics:  No adenopathy Back:  No CVA tenderness Lungs:  Clear with no wheezes HEART:  Regular rate rhythm, no murmurs, no rubs, no clicks Abd:  soft, positive bowel sounds, no organomegally, no rebound, no guarding Ext:  2 plus pulses, no edema, no cyanosis, no clubbing Skin:  No rashes no nodules Neuro:  CN II through XII intact, motor grossly intact   DEVICE  Normal device function except for a recent elevated shock impedence of over 200 ohms.  See PaceArt for details.   Assess/Plan: 1. Chronic systolic heart failure - his symptoms remain class 2. He will continue his current meds.  2. VT - he has maintained NSR on low dose amiodarone   3. ETOH abuse - he has not consumed excess ETOH. He drinks rarely. I encouraged him to never drink more than 2 beers a day or 7 in a week. 4. ICD - his medtronic Biv ICD demonstrates a singlel recent elevated shock impedence along with another previous elevation. Today the shock impedence is within normal limits. I have discussed the treatment options from watchful waiting to proceeding with ICD lead extraction. He would like to pursue extraction and insertion of a new ICD lead. I have reviewed the indications/risks/benefits/goals/expectations of ICD system removal and he wishes to proceed.   Oscar Hank,MD  Jona Zappone,MD 

## 2022-10-29 NOTE — Progress Notes (Signed)
HPI [name] returns today for followup. He is a pleasant middle aged man with chronic systolic heart failure and VT, prior ETOH abuse, s/p ICD insertion. His VT has been well controlled on amiodarone 100 mg daily and with cessation of ETOH abuse. He has not had syncope. He admits to dietary indiscretion and has not been working out. He has gained weight but then has lost most of it back. He denies chest pain or sob. He was found to have an elevated shocking impedence on his ICD lead. He presents to discuss the treatment options. Allergies  Allergen Reactions   Bactrim Rash   Sulfamethoxazole-Trimethoprim Rash     Current Outpatient Medications  Medication Sig Dispense Refill   acetaminophen (TYLENOL) 325 MG tablet Take 2 tablets (650 mg total) by mouth every 4 (four) hours as needed for mild pain (or temp > 37.5 C (99.5 F)).     allopurinol (ZYLOPRIM) 100 MG tablet TAKE 1 TABLET(100 MG) BY MOUTH TWICE DAILY 90 tablet 3   amiodarone (PACERONE) 200 MG tablet Take 0.5 tablets (100 mg total) by mouth daily. 45 tablet 3   aspirin EC 81 MG tablet Take 1 tablet (81 mg total) by mouth daily. Swallow whole. 30 tablet 12   carvedilol (COREG) 25 MG tablet Take 1 tablet (25 mg total) by mouth 2 (two) times daily with a meal. 180 tablet 3   cholecalciferol (VITAMIN D) 25 MCG (1000 UNIT) tablet Take 1,000 Units by mouth daily.     colchicine 0.6 MG tablet Take 1 tablet (0.6 mg total) by mouth 2 (two) times daily. 120 tablet 5   diclofenac sodium (VOLTAREN) 1 % GEL Apply 2 g topically 4 (four) times daily. 100 g 11   dolutegravir (TIVICAY) 50 MG tablet TAKE 1 TABLET BY MOUTH DAILY 30 tablet 11   empagliflozin (JARDIANCE) 10 MG TABS tablet Take 1 tablet (10 mg total) by mouth daily. 30 tablet 11   emtricitabine-rilpivir-tenofovir AF (ODEFSEY) 200-25-25 MG TABS tablet Take 1 tablet by mouth daily. 30 tablet 11   ENTRESTO 97-103 MG TAKE 1 TABLET BY MOUTH TWICE DAILY 180 tablet 3   fluticasone  (FLONASE) 50 MCG/ACT nasal spray SHAKE LIQUID AND USE 2 SPRAYS IN EACH NOSTRIL DAILY 16 g 6   furosemide (LASIX) 20 MG tablet Take 1 tablet (20 mg total) by mouth daily. 30 tablet 8   gabapentin (NEURONTIN) 300 MG capsule Take 300 mg by mouth 3 (three) times daily.     Multiple Vitamins-Minerals (CENTRUM SILVER 50+MEN PO) Take 1 tablet by mouth daily.     Omega-3 Fatty Acids (FISH OIL) 1000 MG CPDR Take 1,000 mg by mouth daily.     ondansetron (ZOFRAN) 4 MG tablet Take 4 mg by mouth every 8 (eight) hours as needed for nausea or vomiting.  0   potassium chloride SA (KLOR-CON M) 20 MEQ tablet Take 1 tablet (20 mEq total) by mouth daily. 30 tablet 8   rosuvastatin (CRESTOR) 20 MG tablet Take 0.5 tablets (10 mg total) by mouth daily. 30 tablet 11   spironolactone (ALDACTONE) 25 MG tablet Take 1 tablet (25 mg total) by mouth daily. 90 tablet 3   traZODone (DESYREL) 50 MG tablet TAKE 1 TABLET(50 MG) BY MOUTH AT BEDTIME 30 tablet 2   triamcinolone ointment (KENALOG) 0.1 % Apply topically.     No current facility-administered medications for this visit.     Past Medical History:  Diagnosis Date   Chronic systolic heart failure (  HCC)    a. Echo 12/05/11:  EF 20-25%, diff HK with mild sparing of IL wall, mild AI, mod MR, mild LAE, mild RVE, mild reduced RVF.;  b.  Echo 05/11/2012:  Mild LVH, EF 20-25%, Gr 1 diast dysfn, mod MR, mild LAE   Depression    G6PD deficiency    GERD (gastroesophageal reflux disease)    Hepatitis    Hep B and Hep C (patient does not report this but these are listed in previous notes.)   HIV infection (HCC)    Hypertension    NICM (nonischemic cardiomyopathy) (HCC)    cardia CTA 8/13 negative for obstructive CAD   Presence of permanent cardiac pacemaker    VT (ventricular tachycardia) (HCC)     ROS:   All systems reviewed and negative except as noted in the HPI.   Past Surgical History:  Procedure Laterality Date   BI-VENTRICULAR PACEMAKER INSERTION N/A 06/26/2013    Procedure: BI-VENTRICULAR PACEMAKER INSERTION (CRT-P);  Surgeon: Marinus Maw, MD;  Location: [facility] CATH LAB;  Service: Cardiovascular;  Laterality: N/A;   BIV ICD GENERATOR CHANGEOUT N/A 01/24/2021   Procedure: BIV ICD GENERATOR CHANGEOUT;  Surgeon: Marinus Maw, MD;  Location: [facility] INVASIVE CV LAB;  Service: Cardiovascular;  Laterality: N/A;   COLONOSCOPY WITH PROPOFOL N/A 08/14/2016   Procedure: COLONOSCOPY WITH PROPOFOL;  Surgeon: Jeani Hawking, MD;  Location: [facility];  Service: Endoscopy;  Laterality: N/A;   ELECTROPHYSIOLOGY STUDY N/A 02/19/2012   Procedure: ELECTROPHYSIOLOGY STUDY;  Surgeon: Marinus Maw, MD;  Location: [facility] CATH LAB;  Service: Cardiovascular;  Laterality: N/A;   ESOPHAGOGASTRODUODENOSCOPY  12/07/2011   Procedure: ESOPHAGOGASTRODUODENOSCOPY (EGD);  Surgeon: [name], MD,FACG;  Location: [facility] ENDOSCOPY;  Service: Endoscopy;  Laterality: N/A;   FINGER SURGERY     Thumb laceration.     HARDWARE REMOVAL Right 07/01/2018   Procedure: HARDWARE REMOVAL;  Surgeon: Sheral Apley, MD;  Location: [facility];  Service: Orthopedics;  Laterality: Right;   INCISION AND DRAINAGE Right 07/01/2018   Procedure: INCISION AND DRAINAGE;  Surgeon: Sheral Apley, MD;  Location: [facility];  Service: Orthopedics;  Laterality: Right;   LEFT HEART CATHETERIZATION WITH CORONARY ANGIOGRAM N/A 01/31/2014   Procedure: LEFT HEART CATHETERIZATION WITH CORONARY ANGIOGRAM;  Surgeon: Iran Ouch, MD;  Location: [facility]ATH [facility];  Service: Cardiovascular;  Laterality: N/A;   ORIF ANKLE FRACTURE Right 01/26/2017   Procedure: OPEN REDUCTION INTERNAL FIXATION (ORIF) ANKLE FRACTURE;  Surgeon: Sheral Apley, MD;  Location: [facility] OR;  Service: Orthopedics;  Laterality: Right;     Family History  Problem Relation Age of Onset   Lung cancer Mother    Colon cancer Other 97     Social History   Socioeconomic History   Marital status: Single    Spouse name: Not on  file   Number of children: Not on file   Years of education: Not on file   Highest education level: Not on file  Occupational History   Occupation: Unemployed  Tobacco Use   Smoking status: Never   Smokeless tobacco: Never  Vaping Use   Vaping Use: Never used  Substance and Sexual Activity   Alcohol use: Yes    Alcohol/week: 1.0 standard drink of alcohol    Types: 1 Cans of beer per week    Comment: occ beer   Drug use: Not Currently    Frequency: 7.0 times per week    Types: Marijuana    Comment:  1x/week, smoked day before surgery   Sexual activity: Yes    Partners: Male    Comment: declined condoms  Other Topics Concern   Not on file  Social History Narrative   Lives alone.  Drinks beer daily.  Liquor rarely.  He occasionally smokes marijuana..   Social Determinants of Health   Financial Resource Strain: Not on file  Food Insecurity: Not on file  Transportation Needs: Not on file  Physical Activity: Not on file  Stress: Not on file  Social Connections: Not on file  Intimate Partner Violence: Not on file     BP 110/72   Pulse 85   Ht 5' 4.5" (1.638 m)   Wt 167 lb 3.2 oz (75.8 kg)   SpO2 97%   BMI 28.26 kg/m   Physical Exam:  Well appearing middle aged man, NAD HEENT: Unremarkable Neck:  No JVD, no thyromegally Lymphatics:  No adenopathy Back:  No CVA tenderness Lungs:  Clear with no wheezes HEART:  Regular rate rhythm, no murmurs, no rubs, no clicks Abd:  soft, positive bowel sounds, no organomegally, no rebound, no guarding Ext:  2 plus pulses, no edema, no cyanosis, no clubbing Skin:  No rashes no nodules Neuro:  CN II through XII intact, motor grossly intact   DEVICE  Normal device function except for a recent elevated shock impedence of over 200 ohms.  See PaceArt for details.   Assess/Plan: 1. Chronic systolic heart failure - his symptoms remain class 2. He will continue his current meds.  2. VT - he has maintained NSR on low dose amiodarone   3. ETOH abuse - he has not consumed excess ETOH. He drinks rarely. I encouraged him to never drink more than 2 beers a day or 7 in a week. 4. ICD - his medtronic Biv ICD demonstrates a singlel recent elevated shock impedence along with another previous elevation. Today the shock impedence is within normal limits. I have discussed the treatment options from watchful waiting to proceeding with ICD lead extraction. He would like to pursue extraction and insertion of a new ICD lead. I have reviewed the indications/risks/benefits/goals/expectations of ICD system removal and he wishes to proceed.   Loman Chroman Douglas Smolinsky,MD

## 2022-10-30 ENCOUNTER — Other Ambulatory Visit (HOSPITAL_COMMUNITY): Payer: Self-pay

## 2022-10-30 MED ORDER — CARVEDILOL 25 MG PO TABS: 25.0000 mg | ORAL_TABLET | Freq: Two times a day (BID) | ORAL | 3 refills | Status: DC

## 2022-11-05 ENCOUNTER — Ambulatory Visit (INDEPENDENT_AMBULATORY_CARE_PROVIDER_SITE_OTHER): Payer: 59 | Admitting: Internal Medicine

## 2022-11-05 ENCOUNTER — Other Ambulatory Visit: Payer: Self-pay

## 2022-11-05 ENCOUNTER — Encounter: Payer: Self-pay | Admitting: Internal Medicine

## 2022-11-05 VITALS — BP 100/69 | HR 68 | Temp 97.1°F | Ht 64.5 in | Wt 166.0 lb

## 2022-11-05 DIAGNOSIS — B20 Human immunodeficiency virus [HIV] disease: Secondary | ICD-10-CM | POA: Diagnosis not present

## 2022-11-05 DIAGNOSIS — K7460 Unspecified cirrhosis of liver: Secondary | ICD-10-CM

## 2022-11-05 DIAGNOSIS — Z21 Asymptomatic human immunodeficiency virus [HIV] infection status: Secondary | ICD-10-CM

## 2022-11-05 DIAGNOSIS — I10 Essential (primary) hypertension: Secondary | ICD-10-CM | POA: Diagnosis not present

## 2022-11-05 MED ORDER — TIVICAY 50 MG PO TABS: ORAL_TABLET | ORAL | 11 refills | Status: DC

## 2022-11-05 MED ORDER — ODEFSEY 200-25-25 MG PO TABS
1.0000 | ORAL_TABLET | Freq: Every day | ORAL | 11 refills | Status: DC
Start: 2022-11-05 — End: 2023-07-12

## 2022-11-05 NOTE — Assessment & Plan Note (Signed)
Bp checked and wnl.  Will continue to monitor

## 2022-11-05 NOTE — Progress Notes (Signed)
   Subjective:    Patient ID: [name], male    DOB: 1966/05/16, 58 y.o.   MRN: [id number]  HPI Ludie is here for his usual follow up for HIV He is taking his Tivicay and Odefsey with no issues.  He is getting his ICD replaced later this month due to an issue with it. He continues to have no issues with getting or taking his medications.     Review of Systems  Constitutional:  Negative for fatigue.  Gastrointestinal:  Negative for diarrhea.  Skin:  Negative for rash.       Objective:   Physical Exam Eyes:     General: No scleral icterus. Pulmonary:     Effort: Pulmonary effort is normal.  Neurological:     Mental Status: He is alert.   SH: no tobacco        Assessment & Plan:

## 2022-11-05 NOTE — Assessment & Plan Note (Signed)
Will continue with HCC screening and scheduled for next week

## 2022-11-05 NOTE — Assessment & Plan Note (Signed)
He continues to do well with his salvage regimen and no changes indicated.  Refills provided and he will otherwise rtc in 6 months.

## 2022-11-06 LAB — T-HELPER CELL (CD4) - (RCID CLINIC ONLY)
CD4 % Helper T Cell: 36 % (ref 33–65)
CD4 T Cell Abs: 590 /uL (ref 400–1790)

## 2022-11-07 LAB — HIV-1 RNA QUANT-NO REFLEX-BLD
HIV 1 RNA Quant: NOT DETECTED Copies/mL
HIV-1 RNA Quant, Log: NOT DETECTED Log cps/mL

## 2022-11-08 NOTE — Pre-Procedure Instructions (Addendum)
Patient is scheduled for anesthesia procedure on Monday 6/17.  He takes Jardiance, left voicemail that this will need to be held on Friday 6/14, Saturday 6/15 and Sunday 6/16.  Don't take day of procedure.  1610- patient called back and verbalized understanding.

## 2022-11-10 ENCOUNTER — Ambulatory Visit: Payer: 59 | Attending: Internal Medicine

## 2022-11-10 DIAGNOSIS — I10 Essential (primary) hypertension: Secondary | ICD-10-CM

## 2022-11-10 DIAGNOSIS — Z95810 Presence of automatic (implantable) cardiac defibrillator: Secondary | ICD-10-CM

## 2022-11-10 DIAGNOSIS — I428 Other cardiomyopathies: Secondary | ICD-10-CM

## 2022-11-10 DIAGNOSIS — I5022 Chronic systolic (congestive) heart failure: Secondary | ICD-10-CM

## 2022-11-11 ENCOUNTER — Other Ambulatory Visit: Payer: 59

## 2022-11-11 ENCOUNTER — Ambulatory Visit
Admission: RE | Admit: 2022-11-11 | Discharge: 2022-11-11 | Disposition: A | Discharge status: Home / Self Care | Payer: 59 | Source: Ambulatory Visit | Attending: Nurse Practitioner | Admitting: Nurse Practitioner

## 2022-11-11 DIAGNOSIS — K7469 Other cirrhosis of liver: Secondary | ICD-10-CM

## 2022-11-11 DIAGNOSIS — R634 Abnormal weight loss: Secondary | ICD-10-CM

## 2022-11-11 LAB — CBC
Hematocrit: 38.5 % (ref 37.5–51.0)
Hemoglobin: 13.2 g/dL (ref 13.0–17.7)
MCH: 31 pg (ref 26.6–33.0)
MCHC: 34.3 g/dL (ref 31.5–35.7)
MCV: 90 fL (ref 79–97)
Platelets: 245 10*3/uL (ref 150–450)
RBC: 4.26 x10E6/uL (ref 4.14–5.80)
RDW: 13.6 % (ref 11.6–15.4)
WBC: 9.1 10*3/uL (ref 3.4–10.8)

## 2022-11-11 LAB — BASIC METABOLIC PANEL
BUN/Creatinine Ratio: 12 (ref 9–20)
BUN: 14 mg/dL (ref 6–24)
CO2: 20 mmol/L (ref 20–29)
Calcium: 10.3 mg/dL — ABNORMAL HIGH (ref 8.7–10.2)
Chloride: 105 mmol/L (ref 96–106)
Creatinine, Ser: 1.18 mg/dL (ref 0.76–1.27)
Glucose: 101 mg/dL — ABNORMAL HIGH (ref 70–99)
Potassium: 4.3 mmol/L (ref 3.5–5.2)
Sodium: 142 mmol/L (ref 134–144)
eGFR: 71 mL/min/{1.73_m2} (ref >59)

## 2022-11-13 ENCOUNTER — Encounter (HOSPITAL_COMMUNITY): Payer: Self-pay | Admitting: Internal Medicine

## 2022-11-13 ENCOUNTER — Other Ambulatory Visit: Payer: Self-pay

## 2022-11-13 NOTE — Progress Notes (Addendum)
[name] denies chest pain or shortness of breath. .Patient denies having any s/s of Covid in his household, also denies any known exposure to Covid.  [name] denies  any s/s of upper or lower respiratory in the past 8 weeks.  [name] reports that he was instructed to NPO after midnight, patient asked about medications I instructed [name] to hold  vitamins and Fish oil until instructed to start back on them.  I shared with patient that the letter of instructions he was given, instructed to hold medications on the morning of surgery, and to hold Jardiance 3 days prior to  procedure and hold ASA 5 days prior to surgery. [name] said that he was instructed to hold Jardiance, but not the Aspirin, "I took it today,  patient reported."  I began to instruct [name] on showering, patient said he has those instructions and CHG.  I informed patient that the shower instructions are on the  same page as shower instructions. Patient then acknowledged that he did not read that part.   I went over the instruction until showering.    I asked [name] who would be driving him home and staying with him the first 24 hours after the procedure. Patient said that he is not going home until he knows he is stable, "at least 1 night."  The procedure list had ambulatory. I sent April Garrison, CMA a secured message with the information that patient is still taking ASA and he is not going home the day of procedure. April replied that she will call patient.

## 2022-11-16 ENCOUNTER — Ambulatory Visit (HOSPITAL_COMMUNITY): Payer: 59 | Admitting: Certified Registered Nurse Anesthetist

## 2022-11-16 ENCOUNTER — Ambulatory Visit (HOSPITAL_COMMUNITY): Payer: 59

## 2022-11-16 ENCOUNTER — Other Ambulatory Visit: Payer: Self-pay

## 2022-11-16 ENCOUNTER — Ambulatory Visit (HOSPITAL_COMMUNITY)
Admission: RE | Disposition: A | Discharge status: Home / Self Care | Payer: 59 | Source: Ambulatory Visit | Attending: Internal Medicine

## 2022-11-16 ENCOUNTER — Observation Stay (HOSPITAL_COMMUNITY)
Admission: RE | Admit: 2022-11-16 | Discharge: 2022-11-17 | Disposition: A | Discharge status: Home / Self Care | Payer: 59 | Source: Ambulatory Visit | Attending: Internal Medicine | Admitting: Internal Medicine

## 2022-11-16 ENCOUNTER — Encounter (HOSPITAL_COMMUNITY): Payer: Self-pay | Admitting: Internal Medicine

## 2022-11-16 DIAGNOSIS — T82120A Displacement of cardiac electrode, initial encounter: Secondary | ICD-10-CM | POA: Diagnosis not present

## 2022-11-16 DIAGNOSIS — Z79899 Other long term (current) drug therapy: Secondary | ICD-10-CM | POA: Diagnosis not present

## 2022-11-16 DIAGNOSIS — T82118A Breakdown (mechanical) of other cardiac electronic device, initial encounter: Principal | ICD-10-CM | POA: Insufficient documentation

## 2022-11-16 DIAGNOSIS — I110 Hypertensive heart disease with heart failure: Secondary | ICD-10-CM | POA: Insufficient documentation

## 2022-11-16 DIAGNOSIS — Y828 Other medical devices associated with adverse incidents: Secondary | ICD-10-CM | POA: Insufficient documentation

## 2022-11-16 DIAGNOSIS — Z7982 Long term (current) use of aspirin: Secondary | ICD-10-CM | POA: Diagnosis not present

## 2022-11-16 DIAGNOSIS — F418 Other specified anxiety disorders: Secondary | ICD-10-CM | POA: Diagnosis not present

## 2022-11-16 DIAGNOSIS — T82110S Breakdown (mechanical) of cardiac electrode, sequela: Secondary | ICD-10-CM

## 2022-11-16 DIAGNOSIS — T82110A Breakdown (mechanical) of cardiac electrode, initial encounter: Secondary | ICD-10-CM

## 2022-11-16 DIAGNOSIS — Z21 Asymptomatic human immunodeficiency virus [HIV] infection status: Secondary | ICD-10-CM | POA: Diagnosis not present

## 2022-11-16 DIAGNOSIS — B20 Human immunodeficiency virus [HIV] disease: Secondary | ICD-10-CM | POA: Insufficient documentation

## 2022-11-16 DIAGNOSIS — I509 Heart failure, unspecified: Secondary | ICD-10-CM

## 2022-11-16 DIAGNOSIS — I5022 Chronic systolic (congestive) heart failure: Secondary | ICD-10-CM | POA: Insufficient documentation

## 2022-11-16 DIAGNOSIS — Z95810 Presence of automatic (implantable) cardiac defibrillator: Principal | ICD-10-CM

## 2022-11-16 HISTORY — DX: Presence of automatic (implantable) cardiac defibrillator: Z95.810

## 2022-11-16 HISTORY — DX: Pneumonia, unspecified organism: J18.9

## 2022-11-16 HISTORY — PX: TEE WITHOUT CARDIOVERSION: SHX5443

## 2022-11-16 HISTORY — PX: LEAD EXTRACTION: EP1211

## 2022-11-16 LAB — SURGICAL PCR SCREEN
MRSA, PCR: NEGATIVE
Staphylococcus aureus: NEGATIVE

## 2022-11-16 LAB — GLUCOSE, CAPILLARY: Glucose-Capillary: 123 mg/dL — ABNORMAL HIGH (ref 70–99)

## 2022-11-16 LAB — BPAM RBC: Unit Type and Rh: 5100

## 2022-11-16 LAB — PREPARE RBC (CROSSMATCH)

## 2022-11-16 LAB — TYPE AND SCREEN: Unit division: 0

## 2022-11-16 SURGERY — LEAD EXTRACTION
Anesthesia: General

## 2022-11-16 MED ORDER — MIDAZOLAM HCL 2 MG/2ML IJ SOLN
INTRAMUSCULAR | Status: AC
  Filled 2022-11-16: qty 2

## 2022-11-16 MED ORDER — ONDANSETRON HCL 4 MG/2ML IJ SOLN: 4.0000 mg | Freq: Four times a day (QID) | INTRAMUSCULAR | Status: DC | PRN

## 2022-11-16 MED ORDER — CEFAZOLIN SODIUM-DEXTROSE 2-4 GM/100ML-% IV SOLN
2.0000 g | INTRAVENOUS | Status: AC
  Administered 2022-11-16: 2 g via INTRAVENOUS

## 2022-11-16 MED ORDER — CHLORHEXIDINE GLUCONATE 4 % EX SOLN: 4.0000 | Freq: Once | CUTANEOUS | Status: DC

## 2022-11-16 MED ORDER — SUGAMMADEX SODIUM 200 MG/2ML IV SOLN
INTRAVENOUS | Status: DC | PRN
  Administered 2022-11-16: 200 mg via INTRAVENOUS

## 2022-11-16 MED ORDER — MIDAZOLAM HCL 5 MG/5ML IJ SOLN
INTRAMUSCULAR | Status: DC | PRN
  Administered 2022-11-16: 2 mg via INTRAVENOUS

## 2022-11-16 MED ORDER — HYDROCODONE-ACETAMINOPHEN 5-325 MG PO TABS
1.0000 | ORAL_TABLET | ORAL | Status: DC | PRN
  Administered 2022-11-16 – 2022-11-17 (×2): 2 via ORAL
  Filled 2022-11-16 (×2): qty 2

## 2022-11-16 MED ORDER — POVIDONE-IODINE 10 % EX SWAB
2.0000 | Freq: Once | CUTANEOUS | Status: AC
  Administered 2022-11-16: 2 via TOPICAL

## 2022-11-16 MED ORDER — FENTANYL CITRATE (PF) 250 MCG/5ML IJ SOLN
INTRAMUSCULAR | Status: DC | PRN
  Administered 2022-11-16 (×3): 50 ug via INTRAVENOUS
  Administered 2022-11-16 (×2): 25 ug via INTRAVENOUS

## 2022-11-16 MED ORDER — SODIUM CHLORIDE 0.9 % IV SOLN: INTRAVENOUS | Status: DC

## 2022-11-16 MED ORDER — CARVEDILOL 12.5 MG PO TABS
ORAL_TABLET | ORAL | Status: AC
  Administered 2022-11-16: 25 mg via ORAL
  Filled 2022-11-16: qty 2

## 2022-11-16 MED ORDER — SODIUM CHLORIDE 0.9% IV SOLUTION: Freq: Once | INTRAVENOUS | Status: DC

## 2022-11-16 MED ORDER — TRAZODONE HCL 50 MG PO TABS
50.0000 mg | ORAL_TABLET | Freq: Every evening | ORAL | Status: DC | PRN
  Administered 2022-11-16: 50 mg via ORAL
  Filled 2022-11-16: qty 1

## 2022-11-16 MED ORDER — CEFAZOLIN SODIUM-DEXTROSE 2-4 GM/100ML-% IV SOLN
INTRAVENOUS | Status: AC
  Filled 2022-11-16: qty 100

## 2022-11-16 MED ORDER — LIDOCAINE 2% (20 MG/ML) 5 ML SYRINGE
INTRAMUSCULAR | Status: DC | PRN
  Administered 2022-11-16: 100 mg via INTRAVENOUS

## 2022-11-16 MED ORDER — PROPOFOL 10 MG/ML IV BOLUS
INTRAVENOUS | Status: DC | PRN
  Administered 2022-11-16: 120 mg via INTRAVENOUS

## 2022-11-16 MED ORDER — CHLORHEXIDINE GLUCONATE 0.12 % MT SOLN
OROMUCOSAL | Status: AC
  Administered 2022-11-16: 15 mL
  Filled 2022-11-16: qty 15

## 2022-11-16 MED ORDER — ACETAMINOPHEN 325 MG PO TABS
ORAL_TABLET | ORAL | Status: AC
  Filled 2022-11-16: qty 2

## 2022-11-16 MED ORDER — ONDANSETRON HCL 4 MG/2ML IJ SOLN
INTRAMUSCULAR | Status: DC | PRN
  Administered 2022-11-16: 4 mg via INTRAVENOUS

## 2022-11-16 MED ORDER — DOLUTEGRAVIR SODIUM 50 MG PO TABS
50.0000 mg | ORAL_TABLET | Freq: Every day | ORAL | Status: DC
  Administered 2022-11-17: 50 mg via ORAL
  Filled 2022-11-16 (×2): qty 1

## 2022-11-16 MED ORDER — HEPARIN (PORCINE) IN NACL 1000-0.9 UT/500ML-% IV SOLN
INTRAVENOUS | Status: DC | PRN
  Administered 2022-11-16: 500 mL

## 2022-11-16 MED ORDER — CARVEDILOL 25 MG PO TABS
25.0000 mg | ORAL_TABLET | Freq: Once | ORAL | Status: AC
  Filled 2022-11-16: qty 1

## 2022-11-16 MED ORDER — SODIUM CHLORIDE 0.9 % IV SOLN
80.0000 mg | INTRAVENOUS | Status: AC
  Administered 2022-11-16: 80 mg

## 2022-11-16 MED ORDER — EMTRICITAB-RILPIVIR-TENOFOV AF 200-25-25 MG PO TABS
1.0000 | ORAL_TABLET | Freq: Every day | ORAL | Status: DC
  Administered 2022-11-17: 1 via ORAL
  Filled 2022-11-16: qty 1

## 2022-11-16 MED ORDER — FENTANYL CITRATE (PF) 100 MCG/2ML IJ SOLN
INTRAMUSCULAR | Status: AC
  Filled 2022-11-16: qty 2

## 2022-11-16 MED ORDER — ROCURONIUM BROMIDE 10 MG/ML (PF) SYRINGE
PREFILLED_SYRINGE | INTRAVENOUS | Status: DC | PRN
  Administered 2022-11-16: 20 mg via INTRAVENOUS
  Administered 2022-11-16: 70 mg via INTRAVENOUS
  Administered 2022-11-16: 10 mg via INTRAVENOUS

## 2022-11-16 MED ORDER — GENTAMICIN IN SALINE 1.6-0.9 MG/ML-% IV SOLN
INTRAVENOUS | Status: AC
  Filled 2022-11-16: qty 50

## 2022-11-16 MED ORDER — CEFAZOLIN SODIUM-DEXTROSE 1-4 GM/50ML-% IV SOLN
1.0000 g | Freq: Four times a day (QID) | INTRAVENOUS | Status: AC
  Administered 2022-11-16 – 2022-11-17 (×3): 1 g via INTRAVENOUS
  Filled 2022-11-16 (×3): qty 50

## 2022-11-16 MED ORDER — PHENYLEPHRINE HCL-NACL 20-0.9 MG/250ML-% IV SOLN
INTRAVENOUS | Status: DC | PRN
  Administered 2022-11-16: 30 ug/min via INTRAVENOUS

## 2022-11-16 MED ORDER — SODIUM CHLORIDE 0.9 % IV SOLN
INTRAVENOUS | Status: AC
  Filled 2022-11-16: qty 2

## 2022-11-16 MED ORDER — SODIUM CHLORIDE 0.9 % IV SOLN: INTRAVENOUS | Status: DC | PRN

## 2022-11-16 MED ORDER — GABAPENTIN 300 MG PO CAPS
300.0000 mg | ORAL_CAPSULE | Freq: Three times a day (TID) | ORAL | Status: DC
  Administered 2022-11-17 (×2): 300 mg via ORAL
  Filled 2022-11-16 (×2): qty 1

## 2022-11-16 MED ORDER — ACETAMINOPHEN 325 MG PO TABS
325.0000 mg | ORAL_TABLET | ORAL | Status: DC | PRN
  Administered 2022-11-16: 650 mg via ORAL

## 2022-11-16 SURGICAL SUPPLY — 22 items
CATH RIGHTSITE C315HIS02 (CATHETERS) IMPLANT
DEVICE LCKNG LEAD CARDIAC (CATHETERS) IMPLANT
FELT TEFLON 1X6 (MISCELLANEOUS) ×2 IMPLANT
GUIDEWIRE ANGLED .035X150CM (WIRE) IMPLANT
KIT LEAD ACCESSORY 6056M PINCH (KITS) IMPLANT
KIT WRENCH (KITS) IMPLANT
LEAD SELECT SECURE 3830 383069 (Lead) IMPLANT
LEAD SPRINT QUAT SEC 6935-65CM (Lead) IMPLANT
POUCH AIGIS-R ANTIBACT ICD (Mesh General) ×1 IMPLANT
POUCH AIGIS-R ANTIBACT ICD LRG (Mesh General) IMPLANT
REMOVAL LLD CARDIAC LEAD EZ (CATHETERS) ×2
SELECT SECURE 3830 383069 (Lead) ×1 IMPLANT
SHEATH 11 SUB-C ROTATE DILATOR (SHEATH) IMPLANT
SHEATH 7FR PRELUDE SNAP 13 (SHEATH) IMPLANT
SHEATH 9FR PRELUDE SNAP 13 (SHEATH) IMPLANT
SHEATH EVOLUTION RL 13F (SHEATH) IMPLANT
SHEATH PINNACLE 6F 10CM (SHEATH) IMPLANT
SHEATH TIGHTRAIL MECH 11F (SHEATH) IMPLANT
SHEATH TIGHTRAIL MECH 13F (SHEATH) IMPLANT
SLITTER 6232ADJ (MISCELLANEOUS) IMPLANT
WIRE HI TORQ VERSACORE-J 145CM (WIRE) IMPLANT
WIRE MAILMAN 182CM (WIRE) IMPLANT

## 2022-11-16 NOTE — Interval H&P Note (Signed)
History and Physical Interval Note:  11/16/2022 2:00 PM  [name]  has presented today for surgery, with the diagnosis of LEAD FAILURE.  The various methods of treatment have been discussed with the patient and family. After consideration of risks, benefits and other options for treatment, the patient has consented to  Procedure(s): LEAD EXTRACTION (N/A) TRANSESOPHAGEAL ECHOCARDIOGRAM (N/A) as a surgical intervention.  The patient's history has been reviewed, patient examined, no change in status, stable for surgery.  I have reviewed the patient's chart and labs.  Questions were answered to the patient's satisfaction.     Lewayne Bunting

## 2022-11-16 NOTE — Anesthesia Postprocedure Evaluation (Signed)
Anesthesia Post Note  Patient: [name]  Procedure(s) Performed: LEAD EXTRACTION TRANSESOPHAGEAL ECHOCARDIOGRAM     Patient location during evaluation: PACU Anesthesia Type: General Level of consciousness: awake and alert Pain management: pain level controlled Vital Signs Assessment: post-procedure vital signs reviewed and stable Respiratory status: spontaneous breathing, nonlabored ventilation, respiratory function stable and patient connected to nasal cannula oxygen Cardiovascular status: blood pressure returned to baseline and stable Postop Assessment: no apparent nausea or vomiting Anesthetic complications: no  No notable events documented.  Last Vitals:  Vitals:   11/16/22 1743 11/16/22 1745  BP: 108/78 99/78  Pulse: 65 67  Resp: 19 18  Temp:    SpO2: 97% 97%    Last Pain:  Vitals:   11/16/22 1740  TempSrc: Temporal  PainSc: 10-Worst pain ever                 Ernisha Sorn S

## 2022-11-16 NOTE — Anesthesia Preprocedure Evaluation (Addendum)
Anesthesia Evaluation  Patient identified by MRN, date of birth, ID band Patient awake    Reviewed: Allergy & Precautions, H&P , NPO status , Patient's Chart, lab work & pertinent test results  Airway Mallampati: III  TM Distance: <3 FB Neck ROM: Full    Dental no notable dental hx.    Pulmonary neg pulmonary ROS   Pulmonary exam normal breath sounds clear to auscultation       Cardiovascular hypertension, Pt. on home beta blockers +CHF  Normal cardiovascular exam+ dysrhythmias Ventricular Tachycardia + pacemaker + Cardiac Defibrillator  Rhythm:Regular Rate:Normal  TTE 2023 IMPRESSIONS     1. Left ventricular ejection fraction, by estimation, is 50 to 55%. The  left ventricle has low normal function. The left ventricle has no regional  wall motion abnormalities. Left ventricular diastolic parameters were  normal.   2. Right ventricular systolic function is normal. The right ventricular  size is normal. There is normal pulmonary artery systolic pressure.   3. A small pericardial effusion is present. The pericardial effusion is  localized near the right atrium.   4. The mitral valve is normal in structure. Trivial mitral valve  regurgitation. No evidence of mitral stenosis.   5. Tricuspid valve regurgitation is mild to moderate.   6. The aortic valve is tricuspid. Aortic valve regurgitation is not  visualized. No aortic stenosis is present.   7. The inferior vena cava is normal in size with <50% respiratory  variability, suggesting right atrial pressure of 8 mmHg.   Comparison(s): Prior images unable to be directly viewed, comparison made  by report only.     Neuro/Psych  PSYCHIATRIC DISORDERS Anxiety Depression    CVA  negative psych ROS   GI/Hepatic ,GERD  ,,(+) Hepatitis -  Endo/Other  negative endocrine ROS    Renal/GU negative Renal ROS  negative genitourinary   Musculoskeletal negative musculoskeletal  ROS (+)    Abdominal   Peds negative pediatric ROS (+)  Hematology  (+) HIV  Anesthesia Other Findings   Reproductive/Obstetrics negative OB ROS                             Anesthesia Physical Anesthesia Plan  ASA: 3  Anesthesia Plan: General   Post-op Pain Management: Minimal or no pain anticipated   Induction: Intravenous  PONV Risk Score and Plan: 2 and Ondansetron and Dexamethasone  Airway Management Planned: Oral ETT  Additional Equipment: Arterial line and TEE  Intra-op Plan:   Post-operative Plan: Extubation in OR  Informed Consent: I have reviewed the patients History and Physical, chart, labs and discussed the procedure including the risks, benefits and alternatives for the proposed anesthesia with the patient or authorized representative who has indicated his/her understanding and acceptance.     Dental advisory given  Plan Discussed with: CRNA and Surgeon  Anesthesia Plan Comments:        Anesthesia Quick Evaluation

## 2022-11-16 NOTE — Anesthesia Procedure Notes (Signed)
Arterial Line Insertion Start/End6/17/2024 1:40 PM, 11/16/2022 1:55 AM Performed by: Quentin Ore, CRNA, CRNA  Preanesthetic checklist: patient identified, IV checked, site marked, risks and benefits discussed, surgical consent, monitors and equipment checked, pre-op evaluation and timeout performed Lidocaine 1% used for infiltration and patient sedated Right, radial was placed Catheter size: 20 G Hand hygiene performed , maximum sterile barriers used  and Seldinger technique used Allen's test indicative of satisfactory collateral circulation Attempts: 1 Procedure performed without using ultrasound guided technique. Following insertion, Biopatch and dressing applied. Post procedure assessment: normal  Patient tolerated the procedure well with no immediate complications.

## 2022-11-16 NOTE — Transfer of Care (Signed)
Immediate Anesthesia Transfer of Care Note  Patient: [name]  Procedure(s) Performed: LEAD EXTRACTION TRANSESOPHAGEAL ECHOCARDIOGRAM  Patient Location: Cath Lab  Anesthesia Type:General  Level of Consciousness: awake, alert , and oriented  Airway & Oxygen Therapy: Patient Spontanous Breathing and Patient connected to nasal cannula oxygen  Post-op Assessment: Report given to RN and Post -op Vital signs reviewed and stable  Post vital signs: Reviewed and stable  Last Vitals:  Vitals Value Taken Time  BP 98/77 11/16/22 1738  Temp    Pulse 66 11/16/22 1741  Resp 17 11/16/22 1741  SpO2 98 % 11/16/22 1741  Vitals shown include unvalidated device data.  Last Pain:  Vitals:   11/16/22 1432  TempSrc:   PainSc: 0-No pain         Complications: No notable events documented.

## 2022-11-16 NOTE — Anesthesia Procedure Notes (Signed)
Procedure Name: Intubation Date/Time: 11/16/2022 2:41 PM  Performed by: Lonia Mad, CRNAPre-anesthesia Checklist: Patient identified, Emergency Drugs available, Suction available and Patient being monitored Patient Re-evaluated:Patient Re-evaluated prior to induction Oxygen Delivery Method: Circle System Utilized Preoxygenation: Pre-oxygenation with 100% oxygen Induction Type: IV induction Ventilation: Mask ventilation without difficulty Laryngoscope Size: Mac and 4 Grade View: Grade I Tube type: Oral Tube size: 7.5 mm Number of attempts: 1 Airway Equipment and Method: Stylet and Oral airway Placement Confirmation: ETT inserted through vocal cords under direct vision, positive ETCO2 and breath sounds checked- equal and bilateral Secured at: 22 cm Tube secured with: Tape Dental Injury: Teeth and Oropharynx as per pre-operative assessment

## 2022-11-16 NOTE — Progress Notes (Signed)
      [address]       [place] [address]             [phone]      I provided on site surgical backup for the lead extraction by Dr. Ladona Ridgel. Time 61 minutes  Viviann Spare C. Dorris Fetch, MD [facility] [phone]

## 2022-11-17 ENCOUNTER — Encounter (HOSPITAL_COMMUNITY): Payer: Self-pay | Admitting: Internal Medicine

## 2022-11-17 ENCOUNTER — Observation Stay (HOSPITAL_COMMUNITY): Payer: 59

## 2022-11-17 DIAGNOSIS — Z7982 Long term (current) use of aspirin: Secondary | ICD-10-CM | POA: Diagnosis not present

## 2022-11-17 DIAGNOSIS — Z95810 Presence of automatic (implantable) cardiac defibrillator: Secondary | ICD-10-CM | POA: Diagnosis not present

## 2022-11-17 DIAGNOSIS — T82110S Breakdown (mechanical) of cardiac electrode, sequela: Secondary | ICD-10-CM

## 2022-11-17 DIAGNOSIS — I5022 Chronic systolic (congestive) heart failure: Secondary | ICD-10-CM | POA: Diagnosis not present

## 2022-11-17 DIAGNOSIS — Z79899 Other long term (current) drug therapy: Secondary | ICD-10-CM | POA: Diagnosis not present

## 2022-11-17 DIAGNOSIS — T82118A Breakdown (mechanical) of other cardiac electronic device, initial encounter: Secondary | ICD-10-CM | POA: Diagnosis not present

## 2022-11-17 LAB — TYPE AND SCREEN
ABO/RH(D): O NEG
Antibody Screen: NEGATIVE
Unit division: 0

## 2022-11-17 LAB — BPAM RBC
Blood Product Expiration Date: 202407192359
Blood Product Expiration Date: 202407192359
Unit Type and Rh: 5100

## 2022-11-17 MED ORDER — HYDROCODONE-ACETAMINOPHEN 5-325 MG PO TABS: 1.0000 | ORAL_TABLET | Freq: Three times a day (TID) | ORAL | 0 refills | Status: DC | PRN

## 2022-11-17 MED ORDER — ASPIRIN 81 MG PO TBEC: 81.0000 mg | DELAYED_RELEASE_TABLET | Freq: Every day | ORAL | 12 refills | Status: DC

## 2022-11-17 MED FILL — Fentanyl Citrate Preservative Free (PF) Inj 100 MCG/2ML: INTRAMUSCULAR | Qty: 4 | Status: AC

## 2022-11-17 NOTE — Discharge Summary (Addendum)
ELECTROPHYSIOLOGY PROCEDURE DISCHARGE SUMMARY    Patient ID: [name],  MRN: [id number], DOB/AGE: 10/03/1965 57 y.o.  Admit date: 11/16/2022 Discharge date: 11/17/2022  Primary Care Physician: Hillery Aldo, NP  Primary Cardiologist: None  Electrophysiologist: Dr. Ladona Ridgel    Primary Diagnosis:  ICD lead malfunction  Secondary Diagnosis: H/o VT   Allergies  Allergen Reactions   Bactrim Rash   Sulfamethoxazole-Trimethoprim Rash     Procedures This Admission:  1.  Extraction of a  2. Implantation of a Medtronic BiV ICD on 6/17 by Dr. Ladona Ridgel.  The patient kept his prior generator, a Medtronic Claria MRI CRT-D [id number]  with > 6 years of battery life, with a chronic Medtronic 5076 right atrial lead and Medtronic Sprint Quattro Secure [id number] right ventricular lead. Pt received a Medtronic SelectSure 3830 "LV lead" in the left bundle position.  DFTs were deferred at time of implant There were no post procedure complications 2.  CXR on 11/17/22 demonstrated no pneumothorax status post device implantation.      Brief HPI: [name] is a 57 y.o. male has been followed by EP in the outpatient setting for management of his ICD. Device alerts were received for impedance, and office check revealed an elevated shock impedence event, along with another prior elevation. Given h/o therapies, discussed treatment options including watchful waiting vs lead extraction. Past medical history includes above. Risks, benefits, and alternatives to ICD extraction and revision were reviewed with the patient who wished to proceed.   Hospital Course:  The patient was admitted and underwent extraction of a defunct RV lead as well as an LV lead that fractured during the case, and implantation of a Medtronic BiV ICD with details as outlined above. They were monitored on telemetry overnight which demonstrated appropriate pacing .  Left chest was without hematoma or ecchymosis.  The device was  interrogated and found to be functioning normally.  CXR was obtained and demonstrated no pneumothorax status post device implantation..  Wound care, arm mobility, and restrictions were reviewed with the patient.  The patient was examined and considered stable for discharge to home.   The patient's discharge medications include an ACE-I/ARB/ARNI Sherryll Burger) and beta blocker (Coreg).   Anticoagulation resumption This patient should resume his aspirin on Monday, 11/23/22  Physical Exam: Vitals:   11/16/22 2258 11/17/22 0313 11/17/22 0814 11/17/22 1542  BP: 102/72 110/89 107/80 124/84  Pulse: 82 79 72 72  Resp: 17 16 12 15   Temp: 97.9 F (36.6 C) 97.8 F (36.6 C) 97.8 F (36.6 C) 99.5 F (37.5 C)  TempSrc: Oral Oral Oral Oral  SpO2: 98% 98% 97%   Weight:      Height:        GEN- NAD. A&O x 3.  HEENT: Normocephalic, atraumatic Lungs- CTAB, normal effort.  Heart- RRR. No M/G/R.  GI- Soft, NT, ND.  Extremities- No clubbing, cyanosis, or edema Skin- Warm and dry, no rash or lesion. ICD site stable s/p pressure dressing removal  Discharge Medications:  Allergies as of 11/17/2022       Reactions   Bactrim Rash   Sulfamethoxazole-trimethoprim Rash        Medication List     TAKE these medications    acetaminophen 325 MG tablet Commonly known as: TYLENOL Take 2 tablets (650 mg total) by mouth every 4 (four) hours as needed for mild pain (or temp > 37.5 C (99.5 F)).   allopurinol 100 MG tablet Commonly known as: ZYLOPRIM TAKE  1 TABLET(100 MG) BY MOUTH TWICE DAILY   amiodarone 200 MG tablet Commonly known as: PACERONE Take 0.5 tablets (100 mg total) by mouth daily.   aspirin EC 81 MG tablet Take 1 tablet (81 mg total) by mouth daily. Swallow whole. Start taking on: November 23, 2022 What changed: These instructions start on November 23, 2022. If you are unsure what to do until then, ask your doctor or other care provider.   carvedilol 25 MG tablet Commonly known as:  COREG Take 1 tablet (25 mg total) by mouth 2 (two) times daily with a meal.   CENTRUM SILVER 50+MEN PO Take 1 tablet by mouth daily.   cholecalciferol 25 MCG (1000 UNIT) tablet Commonly known as: VITAMIN D3 Take 1,000 Units by mouth daily.   colchicine 0.6 MG tablet Take 1 tablet (0.6 mg total) by mouth 2 (two) times daily.   diclofenac sodium 1 % Gel Commonly known as: VOLTAREN Apply 2 g topically 4 (four) times daily. What changed:  when to take this reasons to take this   empagliflozin 10 MG Tabs tablet Commonly known as: Jardiance Take 1 tablet (10 mg total) by mouth daily.   Entresto 97-103 MG Generic drug: sacubitril-valsartan TAKE 1 TABLET BY MOUTH TWICE DAILY   Fish Oil 1000 MG Cpdr Take 1,000 mg by mouth daily.   fluticasone 50 MCG/ACT nasal spray Commonly known as: FLONASE SHAKE LIQUID AND USE 2 SPRAYS IN EACH NOSTRIL DAILY   furosemide 20 MG tablet Commonly known as: LASIX Take 1 tablet (20 mg total) by mouth daily.   gabapentin 300 MG capsule Commonly known as: NEURONTIN Take 300 mg by mouth 3 (three) times daily.   HYDROcodone-acetaminophen 5-325 MG tablet Commonly known as: NORCO/VICODIN Take 1-2 tablets by mouth every 8 (eight) hours as needed for severe pain or moderate pain.   Odefsey 200-25-25 MG Tabs tablet Generic drug: emtricitabine-rilpivir-tenofovir AF Take 1 tablet by mouth daily.   ondansetron 4 MG tablet Commonly known as: ZOFRAN Take 4 mg by mouth every 8 (eight) hours as needed for nausea or vomiting.   potassium chloride SA 20 MEQ tablet Commonly known as: KLOR-CON M Take 1 tablet (20 mEq total) by mouth daily.   rosuvastatin 10 MG tablet Commonly known as: CRESTOR Take 10 mg by mouth daily. What changed: Another medication with the same name was removed. Continue taking this medication, and follow the directions you see here.   spironolactone 25 MG tablet Commonly known as: ALDACTONE Take 1 tablet (25 mg total) by mouth  daily.   Tivicay 50 MG tablet Generic drug: dolutegravir TAKE 1 TABLET BY MOUTH DAILY   traZODone 50 MG tablet Commonly known as: DESYREL TAKE 1 TABLET(50 MG) BY MOUTH AT BEDTIME   triamcinolone ointment 0.1 % Commonly known as: KENALOG Apply 1 Application topically daily as needed (itching).        Disposition:    Follow-up Information     [facility] Follow up.   Specialty: Cardiology Why: on 6/27 at 1040 for post extraction/revision follow up Contact information: [address] [id number] mc [place] [address] [phone]                Duration of Discharge Encounter: Greater than 30 minutes including physician time.  Dustin Flock, PA-C  11/17/2022 4:06 PM  EP Attending  Patient seen and examined. Agree with above. The patient is doing well after ICD lead and LV lead extraction and insertion  of a new ICD and left bundle area pacing lead. The CXR looks good and ICD interrogation under my direction demonstrates normal DDD PM function. He will be discharged home with usual followup.   Sharlot Gowda Twilla Khouri,MD

## 2022-11-17 NOTE — TOC CM/SW Note (Signed)
Transition of Care [facility]) - Inpatient Brief Assessment   Patient Details  Name: [name] MRN: [id number] Date of Birth: 1965/11/24  [facility]) CM/SW Contact:    Gala Lewandowsky, RN Phone Number: 11/17/2022, 11:28 AM   Clinical Narrative: Patient presented for lead failure-post revision. PTA patient was from home and plans to return home. Patient has PCP and gets to appointments without any issues. No home needs identified at this time.    Transition of Care Asessment: Insurance and Status: Insurance coverage has been reviewed Patient has primary care physician: Yes Home environment has been reviewed: reviewed   Prior/Current Home Services: No current home services Social Determinants of Health Reivew: SDOH reviewed no interventions necessary Readmission risk has been reviewed: Yes Transition of care needs: no transition of care needs at this time

## 2022-11-17 NOTE — Care Management CC44 (Signed)
Condition Code 44 Documentation Completed  Patient Details  Name: [name] MRN: [id number] Date of Birth: 11/20/1965   Condition Code 44 given:  Yes Patient signature on Condition Code 44 notice:  Yes Documentation of 2 MD's agreement:  Yes Code 44 added to claim:  Yes    Gala Lewandowsky, RN 11/17/2022, 10:15 AM

## 2022-11-17 NOTE — Discharge Instructions (Signed)
After Your ICD (Implantable Cardiac Defibrillator)   You have a Medtronic ICD  ACTIVITY Do not lift your arm above shoulder height for 1 week after your procedure. After 7 days, you may progress as below.  You should remove your sling 24 hours after your procedure, unless otherwise instructed by your provider.     Tuesday November 24, 2022  Wednesday November 25, 2022 Thursday November 26, 2022 Friday November 27, 2022   Do not lift, push, pull, or carry anything over 10 pounds with the affected arm until 6 weeks (Tuesday December 29, 2022 ) after your procedure.   You may drive AFTER your wound check, unless you have been told otherwise by your provider.   Ask your healthcare provider when you can go back to work   INCISION/Dressing Resume aspirin Monday, 11/23/22   If large square, outer bandage is left in place, this can be removed after 24 hours from your procedure. Do not remove steri-strips or glue as below.   Monitor your defibrillator site for redness, swelling, and drainage. Call the device clinic at [phone] if you experience these symptoms or fever/chills.  If your incision is sealed with Steri-strips or staples, you may shower 7 days after your procedure or when told by your provider. Do not remove the steri-strips or let the shower hit directly on your site. You may wash around your site with soap and water.    If you were discharged in a sling, please do not wear this during the day more than 48 hours after your surgery unless otherwise instructed. This may increase the risk of stiffness and soreness in your shoulder.   Avoid lotions, ointments, or perfumes over your incision until it is well-healed.  You may use a hot tub or a pool AFTER your wound check appointment if the incision is completely closed.  Your ICD is designed to protect you from life threatening heart rhythms. Because of this, you may receive a shock.   1 shock with no symptoms:  Call the office during business  hours. 1 shock with symptoms (chest pain, chest pressure, dizziness, lightheadedness, shortness of breath, overall feeling unwell):  Call 911. If you experience 2 or more shocks in 24 hours:  Call 911. If you receive a shock, you should not drive for 6 months per the [place] DMV IF you receive appropriate therapy from your ICD.   ICD Alerts:  Some alerts are vibratory and others beep. These are NOT emergencies. Please call our office to let us know. If this occurs at night or on weekends, it can wait until the next business day. Send a remote transmission.  If your device is capable of reading fluid status (for heart failure), you will be offered monthly monitoring to review this with you.   DEVICE MANAGEMENT Remote monitoring is used to monitor your ICD from home. This monitoring is scheduled every 91 days by our office. It allows [place] to keep an eye on the functioning of your device to ensure it is working properly. You will routinely see your Electrophysiologist annually (more often if necessary).   You should receive your ID card for your new device in 4-8 weeks. Keep this card with you at all times once received. Consider wearing a medical alert bracelet or necklace.  Your ICD  may be MRI compatible. This will be discussed at your next office visit/wound check.  You should avoid contact with strong electric or magnetic fields.   Do not use amateur (ham)  radio equipment or electric (arc) Optician, dispensing. MP3 player headphones with magnets should not be used. Some devices are safe to use if held at least 12 inches (30 cm) from your defibrillator. These include power tools, lawn mowers, and speakers. If you are unsure if something is safe to use, ask your health care provider.  When using your cell phone, hold it to the ear that is on the opposite side from the defibrillator. Do not leave your cell phone in a pocket over the defibrillator.  You may safely use electric blankets, heating pads,  computers, and microwave ovens.  Call the office right away if: You have chest pain. You feel more than one shock. You feel more short of breath than you have felt before. You feel more light-headed than you have felt before. Your incision starts to open up.  This information is not intended to replace advice given to you by your health care provider. Make sure you discuss any questions you have with your health care provider.

## 2022-11-17 NOTE — Care Management Obs Status (Signed)
MEDICARE OBSERVATION STATUS NOTIFICATION   Patient Details  Name: [name] MRN: [id number] Date of Birth: 1966-01-07   Medicare Observation Status Notification Given:  Yes    Gala Lewandowsky, RN 11/17/2022, 10:15 AM

## 2022-11-19 NOTE — Progress Notes (Signed)
Remote ICD transmission.   

## 2022-11-26 ENCOUNTER — Ambulatory Visit: Payer: 59 | Attending: Cardiology

## 2022-11-26 DIAGNOSIS — I428 Other cardiomyopathies: Secondary | ICD-10-CM

## 2022-11-26 LAB — CUP PACEART INCLINIC DEVICE CHECK
Battery Remaining Longevity: 74 mo
Battery Voltage: 2.99 V
Brady Statistic AP VP Percent: 0.34 %
Brady Statistic AP VS Percent: 0.01 %
Brady Statistic AS VP Percent: 98.25 %
Brady Statistic AS VS Percent: 1.39 %
Brady Statistic RA Percent Paced: 0.35 %
Brady Statistic RV Percent Paced: 0.51 %
Date Time Interrogation Session: 20240627152811
HighPow Impedance: 61 Ohm
Implantable Lead Connection Status: 753985
Implantable Lead Connection Status: 753985
Implantable Lead Connection Status: 753985
Implantable Lead Implant Date: 20150126
Implantable Lead Implant Date: 20240617
Implantable Lead Implant Date: 20240617
Implantable Lead Location: 753859
Implantable Lead Location: 753860
Implantable Lead Location: 753860
Implantable Lead Model: 3830
Implantable Lead Model: 5076
Implantable Lead Model: 6935
Implantable Pulse Generator Implant Date: 20220826
Lead Channel Impedance Value: 247 Ohm
Lead Channel Impedance Value: 342 Ohm
Lead Channel Impedance Value: 342 Ohm
Lead Channel Impedance Value: 380 Ohm
Lead Channel Impedance Value: 380 Ohm
Lead Channel Impedance Value: 570 Ohm
Lead Channel Pacing Threshold Amplitude: 0.5 V
Lead Channel Pacing Threshold Amplitude: 0.625 V
Lead Channel Pacing Threshold Amplitude: 0.75 V
Lead Channel Pacing Threshold Pulse Width: 0.4 ms
Lead Channel Pacing Threshold Pulse Width: 0.4 ms
Lead Channel Pacing Threshold Pulse Width: 0.6 ms
Lead Channel Sensing Intrinsic Amplitude: 0.75 mV
Lead Channel Sensing Intrinsic Amplitude: 1 mV
Lead Channel Sensing Intrinsic Amplitude: 5.625 mV
Lead Channel Sensing Intrinsic Amplitude: 8.25 mV
Lead Channel Setting Pacing Amplitude: 1.5 V
Lead Channel Setting Pacing Amplitude: 2.5 V
Lead Channel Setting Pacing Amplitude: 3.5 V
Lead Channel Setting Pacing Pulse Width: 0.4 ms
Lead Channel Setting Pacing Pulse Width: 0.6 ms
Lead Channel Setting Sensing Sensitivity: 0.3 mV
Zone Setting Status: 755011
Zone Setting Status: 755011

## 2022-11-26 MED ORDER — CEPHALEXIN 500 MG PO CAPS: 500.0000 mg | ORAL_CAPSULE | Freq: Two times a day (BID) | ORAL | 0 refills | Status: DC

## 2022-11-26 NOTE — Patient Instructions (Addendum)
   After Your ICD (Implantable Cardiac Defibrillator)    Monitor your defibrillator site for redness, swelling, and drainage. Call the device clinic at [phone] if you experience these symptoms or fever/chills.  Your incision was closed with Steri-strips or staples:  You may shower 7 days after your procedure and wash your incision with soap and water. Avoid lotions, ointments, or perfumes over your incision until it is well-healed.  You have an open area and small amount of swelling at your device site, wound is still healing.  Wash area with clean wash cloth and dial soap for 30 seconds daily while in shower.  Pat dry using clean wash cloth, clean towel each wash.  Monitor for signs of infection, call us immediately if any concerns.   Take Keflex 500mg  1 pill twice daily X 5 days for infection protection.   Your device indicates that you are retaining fluid at this time.  Take (2) pills daily of your Furosemide 20mg  for 4 days (Until Monday).   You may use a hot tub or a pool after your wound check appointment if the incision is completely closed.  Do not lift, push or pull greater than 10 pounds with the affected arm until 6 weeks after your procedure. UNTIL AFTER JULY 29TH. There are no other restrictions in arm movement after your wound check appointment.  Your ICD is designed to protect you from life threatening heart rhythms. Because of this, you may receive a shock.   1 shock with no symptoms:  Call the office during business hours. 1 shock with symptoms (chest pain, chest pressure, dizziness, lightheadedness, shortness of breath, overall feeling unwell):  Call 911. If you experience 2 or more shocks in 24 hours:  Call 911. If you receive a shock, you should not drive.  [place] DMV - no driving for 6 months if you receive appropriate therapy from your ICD.   ICD Alerts:  Some alerts are vibratory and others beep. These are NOT emergencies. Please call our office to let us know. If  this occurs at night or on weekends, it can wait until the next business day. Send a remote transmission.  If your device is capable of reading fluid status (for heart failure), you will be offered monthly monitoring to review this with you.   Remote monitoring is used to monitor your ICD from home. This monitoring is scheduled every 91 days by our office. It allows [place] to keep an eye on the functioning of your device to ensure it is working properly. You will routinely see your Electrophysiologist annually (more often if necessary).

## 2022-11-26 NOTE — Progress Notes (Signed)
Wound check appointment. Steri-strips removed. Wound without redness.  There is noted 1 area to proximal end of incision that is open and still healing, with slight hematoma in pocket.  Patient is not on a blood thinner.  Dr. Ladona Ridgel evaluated site and orders Keflex 500mg  bid X 5 days.  Patient is to contact office if area does not continue to heal or shows signs of increase in size or of progressing infection.  Normal device function. CRTD device, BIVP 98.4%. Thresholds, sensing, and impedances consistent with implant measurements. Device programmed with appropriate safety margins. Histogram distribution appropriate for patient and level of activity. Optivol was elevated, Dr. Ladona Ridgel orders patient to take 40mg  of Furosemide X 4 days. No mode switches or ventricular arrhythmias noted. Patient educated about med changes, wound care, arm mobility, lifting restrictions, shock plan. ROV in 3 months with implanting physician.

## 2022-11-29 ENCOUNTER — Other Ambulatory Visit (HOSPITAL_COMMUNITY): Payer: Self-pay | Admitting: Family Medicine

## 2022-11-30 ENCOUNTER — Other Ambulatory Visit (HOSPITAL_COMMUNITY): Payer: Self-pay

## 2022-11-30 MED ORDER — POTASSIUM CHLORIDE CRYS ER 20 MEQ PO TBCR: 20.0000 meq | EXTENDED_RELEASE_TABLET | Freq: Every day | ORAL | 8 refills | Status: DC

## 2022-12-15 ENCOUNTER — Telehealth: Payer: Self-pay | Admitting: Internal Medicine

## 2022-12-15 NOTE — Telephone Encounter (Signed)
Spoke with patient, patient scheduled for 0930AM device clinic apt to assess incision site.

## 2022-12-15 NOTE — Telephone Encounter (Signed)
Patient is concerned that a piece of scab is hanging on to the right end of his incision.  Patient wants to know next steps to address this.

## 2022-12-16 ENCOUNTER — Ambulatory Visit: Payer: 59 | Attending: Cardiology

## 2022-12-16 DIAGNOSIS — Z95810 Presence of automatic (implantable) cardiac defibrillator: Secondary | ICD-10-CM

## 2022-12-16 NOTE — Progress Notes (Signed)
Patient in today for evaluation of stitch at device incision site.  Area evaluated with Dr. Elberta Fortis present.  There is a small stitch protruding from proximal end of incision line.  There is no active drainage or sign of infection; otherwise, wound site is healing WNL.  Per Dr. Elberta Fortis orders, stitch clipped just below incision surface.  Area cleansed and wound care instructions given with continued monitoring for infection.  Patient is to return in 1 week for wound recheck only, he has our device number to call if any concerns. Patient verbalizes understanding.

## 2022-12-16 NOTE — Patient Instructions (Signed)
You have 1 small area that is still healing after stitch clipped today. Continue daily to twice daily cleansing of area with dial soap and clean cloth Monitor for signs of infection and/or increased swelling, call [phone] (our device clinic if any concerns) Recheck here in clinic next week: 12/23/22 at 930am.   Have a great day!

## 2022-12-16 NOTE — Progress Notes (Signed)
Patient in to evaluate wound

## 2022-12-23 ENCOUNTER — Ambulatory Visit: Payer: 59

## 2022-12-29 ENCOUNTER — Telehealth: Payer: Self-pay

## 2022-12-29 NOTE — Telephone Encounter (Signed)
Last OV with Ladona Ridgel 10/29/22. No gen card     Pre-operative Risk Assessment    Patient Name: [name]  DOB: 1965/12/13 MRN: [id number]      Request for Surgical Clearance    Procedure:   Cataract Extraction By PE, IOL-Right   Date of Surgery:  Clearance 01/19/23                                 Surgeon:  Dr. Cristal Deer T. Shawnie Dapper Group or Practice Name:  [facility] number:  [phone] Fax number:  [phone]   Type of Clearance Requested:   - Medical  - Pharmacy:  Hold Aspirin pt will need instructions on when/if to hold.    Type of Anesthesia:   IV Sedation   Additional requests/questions:    Signed, Zada Finders   12/29/2022, 9:53 AM

## 2022-12-29 NOTE — Telephone Encounter (Signed)
   Patient Name: TEJA ARREOLA  DOB: 08/27/1965 MRN: [id number]  Primary Cardiologist: None  Chart reviewed as part of pre-operative protocol coverage. Cataract extractions are recognized in guidelines as low risk surgeries that do not typically require specific preoperative testing or holding of blood thinner therapy. Therefore, given past medical history and time since last visit, based on ACC/AHA guidelines, JAROM ROSENSTOCK would be at acceptable risk for the planned procedure without further cardiovascular testing.   I will route this recommendation to the requesting party via Epic fax function and remove from pre-op pool.  Please call with questions.  Napoleon Form, Leodis Rains, NP 12/29/2022, 10:39 AM

## 2022-12-29 NOTE — Telephone Encounter (Signed)
Caller wants to know if patient has been cleared from a device standpoint.  Caller stated they cannot schedule surgery without device clearance.  Caller stated clearance can be faxed to [phone].

## 2022-12-29 NOTE — Telephone Encounter (Signed)
New leads were implanted 11/16/2022. Not per paceart note, concern for infection and patient on abx. Came back into clinic 12/16/22 for wound recheck, stitch was present and wound not completely healed. Patient has wound recheck 01/06/2023.   Consider wound recheck to assess site prior to clearance.

## 2022-12-29 NOTE — Telephone Encounter (Signed)
I called the pt back who was asking if he was cleared from a device standpoint as well. I assured the opt that I will send the message to the device nurse.

## 2023-01-05 ENCOUNTER — Telehealth: Payer: Self-pay | Admitting: Internal Medicine

## 2023-01-05 ENCOUNTER — Other Ambulatory Visit (HOSPITAL_COMMUNITY): Payer: Self-pay | Admitting: Cardiology

## 2023-01-05 MED ORDER — SPIRONOLACTONE 25 MG PO TABS: 25.0000 mg | ORAL_TABLET | Freq: Every day | ORAL | 3 refills | Status: DC

## 2023-01-05 NOTE — Telephone Encounter (Signed)
Office is requesting a callback at [phone] ext [phone] regarding them not receiving clearance from device stand point and surgery can't be done until. Please advise

## 2023-01-05 NOTE — Telephone Encounter (Signed)
Refaxed notes with added note: "Per note pt will not be cleared until wound recheck on 01/06/23. Our office will send updated clearance notes at that time."

## 2023-01-06 ENCOUNTER — Ambulatory Visit: Payer: 59 | Attending: Cardiology

## 2023-01-06 DIAGNOSIS — Z95810 Presence of automatic (implantable) cardiac defibrillator: Secondary | ICD-10-CM

## 2023-01-06 NOTE — Progress Notes (Signed)
Patient in today for final check on wound after delayed healing due to stitch. Wound has healed WNL, no signs of redness, infection or swelling. Wound edges well approximated and no open areas noted. Patient is doing well and has no further concerns.  He has our device number should any concerning changes occur.  He is released from wound monitoring due to normal healing.

## 2023-01-12 ENCOUNTER — Encounter: Payer: Self-pay | Admitting: Internal Medicine

## 2023-01-12 ENCOUNTER — Telehealth: Payer: Self-pay | Admitting: Internal Medicine

## 2023-01-12 ENCOUNTER — Telehealth: Payer: Self-pay | Admitting: *Deleted

## 2023-01-12 NOTE — Telephone Encounter (Signed)
I will forward to our device clinic as notes being requested are from device clinic.

## 2023-01-12 NOTE — Telephone Encounter (Signed)
See previous encounters.  [facility] is following up requesting updates on clearance.  Phone#: [phone] Fax#: [phone]

## 2023-01-12 NOTE — Progress Notes (Signed)
PERIOPERATIVE PRESCRIPTION FOR IMPLANTED CARDIAC DEVICE PROGRAMMING   Patient Information:  Patient: [name]  MRN: [id number]  Date of Birth: 07-03-1965      Planned Procedure:   Cataract Extraction By PE, IOL-Right   Date of Procedure:  01/19/2023  Surgeon:   Dr. Cristal Deer T. Shawnie Dapper Group or Practice Name:  [facility] Phone number:  [phone] Fax number:  [phone]    Device Information:   Clinic EP Physician:   Dr. Lewayne Bunting Device Type:  Defibrillator Manufacturer and Phone #:  Medtronic: [phone] Pacemaker Dependent?:  No Date of Last Device Check:  11/26/2022        Normal Device Function?:  Yes     Electrophysiologist's Recommendations:   Have magnet available. Provide continuous ECG monitoring when magnet is used or reprogramming is to be performed.  Procedure may interfere with device function.  Magnet should be placed over device during procedure.  Per Device Clinic Standing Orders, Lenor Coffin  01/12/2023 10:56 AM

## 2023-01-12 NOTE — Telephone Encounter (Signed)
[facility] is calling back about an update on clearance. They state they still need OV notes from his last device interrogation from 01/06/23.

## 2023-01-15 NOTE — Telephone Encounter (Signed)
Office calling back for notes from the device clinic if it could be faxed to [phone]. Please advise.

## 2023-01-15 NOTE — Telephone Encounter (Signed)
Re-faxed.

## 2023-01-26 ENCOUNTER — Ambulatory Visit: Payer: Medicare Other

## 2023-01-28 ENCOUNTER — Other Ambulatory Visit (HOSPITAL_COMMUNITY): Payer: Self-pay | Admitting: Family Medicine

## 2023-02-04 ENCOUNTER — Other Ambulatory Visit (HOSPITAL_COMMUNITY): Payer: Self-pay

## 2023-02-04 MED ORDER — FUROSEMIDE 20 MG PO TABS: 20.0000 mg | ORAL_TABLET | Freq: Every day | ORAL | 0 refills | Status: DC

## 2023-02-17 ENCOUNTER — Ambulatory Visit (INDEPENDENT_AMBULATORY_CARE_PROVIDER_SITE_OTHER): Payer: 59

## 2023-02-17 DIAGNOSIS — I428 Other cardiomyopathies: Secondary | ICD-10-CM

## 2023-03-03 NOTE — Progress Notes (Signed)
Remote ICD transmission.   

## 2023-03-15 ENCOUNTER — Encounter: Payer: Self-pay | Admitting: Internal Medicine

## 2023-03-15 ENCOUNTER — Ambulatory Visit: Payer: 59 | Attending: Internal Medicine | Admitting: Internal Medicine

## 2023-03-15 VITALS — BP 126/76 | HR 68 | Ht 64.5 in | Wt 166.8 lb

## 2023-03-15 DIAGNOSIS — I472 Ventricular tachycardia, unspecified: Secondary | ICD-10-CM | POA: Diagnosis not present

## 2023-03-15 DIAGNOSIS — Z79899 Other long term (current) drug therapy: Secondary | ICD-10-CM

## 2023-03-15 MED ORDER — FUROSEMIDE 40 MG PO TABS: 40.0000 mg | ORAL_TABLET | Freq: Every day | ORAL | 3 refills | Status: DC

## 2023-03-15 NOTE — Patient Instructions (Signed)
Medication Instructions:  Your physician has recommended you make the following change in your medication:  Increase lasix to 40 mg daily  Lab Work: BMP in 2 weeks  If you have labs (blood work) drawn today and your tests are completely normal, you will receive your results only by: MyChart Message (if you have MyChart) OR A paper copy in the mail If you have any lab test that is abnormal or we need to change your treatment, we will call you to review the results.  Testing/Procedures: None ordered.  Follow-Up: At [facility], you and your health needs are our priority.  As part of our continuing mission to provide you with exceptional heart care, we have created designated Provider Care Teams.  These Care Teams include your primary Cardiologist (physician) and Advanced Practice Providers (APPs -  Physician Assistants and Nurse Practitioners) who all work together to provide you with the care you need, when you need it.   Your next appointment:   1 year(s)  The format for your next appointment:   In Person  Provider:   Lewayne Bunting, MD{or one of the following Advanced Practice Providers on your designated Care Team:   Francis Dowse, [place] Casimiro Needle "Mardelle Matte" [place], [place] Earnest Rosier, NP  Remote monitoring is used to monitor your Pacemaker/ ICD from home. This monitoring reduces the number of office visits required to check your device to one time per year. It allows [place] to keep an eye on the functioning of your device to ensure it is working properly.   Important Information About Sugar

## 2023-03-15 NOTE — Progress Notes (Signed)
HPI [name] returns today for followup. He is a pleasant middle aged man with chronic systolic heart failure and VT, prior ETOH abuse, s/p ICD insertion. His VT has been well controlled on amiodarone 100 mg daily and with cessation of ETOH abuse. He has not had syncope. He admits to dietary indiscretion and has not been working out. He has gained weight but then has lost most of it back. He denies chest pain or sob. He was found to have an elevated shocking impedence on his ICD lead and underwent extraction and re-insertion of a new device. Allergies  Allergen Reactions   Bactrim Rash   Sulfamethoxazole-Trimethoprim Rash     Current Outpatient Medications  Medication Sig Dispense Refill   acetaminophen (TYLENOL) 325 MG tablet Take 2 tablets (650 mg total) by mouth every 4 (four) hours as needed for mild pain (or temp > 37.5 C (99.5 F)).     allopurinol (ZYLOPRIM) 100 MG tablet TAKE 1 TABLET(100 MG) BY MOUTH TWICE DAILY 90 tablet 3   amiodarone (PACERONE) 200 MG tablet Take 0.5 tablets (100 mg total) by mouth daily. 45 tablet 3   aspirin EC 81 MG tablet Take 1 tablet (81 mg total) by mouth daily. Swallow whole. 30 tablet 12   carvedilol (COREG) 25 MG tablet Take 1 tablet (25 mg total) by mouth 2 (two) times daily with a meal. 180 tablet 3   cephALEXin (KEFLEX) 500 MG capsule Take 1 capsule (500 mg total) by mouth 2 (two) times daily. 10 capsule 0   cholecalciferol (VITAMIN D) 25 MCG (1000 UNIT) tablet Take 1,000 Units by mouth daily.     colchicine 0.6 MG tablet Take 1 tablet (0.6 mg total) by mouth 2 (two) times daily. 120 tablet 5   diclofenac sodium (VOLTAREN) 1 % GEL Apply 2 g topically 4 (four) times daily. (Patient taking differently: Apply 2 g topically daily as needed (pain).) 100 g 11   dolutegravir (TIVICAY) 50 MG tablet TAKE 1 TABLET BY MOUTH DAILY 30 tablet 11   empagliflozin (JARDIANCE) 10 MG TABS tablet Take 1 tablet (10 mg total) by mouth daily. 30 tablet 11    emtricitabine-rilpivir-tenofovir AF (ODEFSEY) 200-25-25 MG TABS tablet Take 1 tablet by mouth daily. 30 tablet 11   ENTRESTO 97-103 MG TAKE 1 TABLET BY MOUTH TWICE DAILY 180 tablet 3   fluticasone (FLONASE) 50 MCG/ACT nasal spray SHAKE LIQUID AND USE 2 SPRAYS IN EACH NOSTRIL DAILY 16 g 6   furosemide (LASIX) 40 MG tablet Take 1 tablet (40 mg total) by mouth daily. 90 tablet 3   gabapentin (NEURONTIN) 300 MG capsule Take 300 mg by mouth 3 (three) times daily.     HYDROcodone-acetaminophen (NORCO/VICODIN) 5-325 MG tablet Take 1-2 tablets by mouth every 8 (eight) hours as needed for severe pain or moderate pain. 10 tablet 0   Multiple Vitamins-Minerals (CENTRUM SILVER 50+MEN PO) Take 1 tablet by mouth daily.     Omega-3 Fatty Acids (FISH OIL) 1000 MG CPDR Take 1,000 mg by mouth daily.     ondansetron (ZOFRAN) 4 MG tablet Take 4 mg by mouth every 8 (eight) hours as needed for nausea or vomiting.  0   potassium chloride SA (KLOR-CON M) 20 MEQ tablet TAKE 1 TABLET(20 MEQ) BY MOUTH DAILY 30 tablet 8   potassium chloride SA (KLOR-CON M) 20 MEQ tablet Take 1 tablet (20 mEq total) by mouth daily. 30 tablet 8   rosuvastatin (CRESTOR) 10 MG tablet Take 10 mg  by mouth daily.     spironolactone (ALDACTONE) 25 MG tablet Take 1 tablet (25 mg total) by mouth daily. 90 tablet 3   traZODone (DESYREL) 50 MG tablet TAKE 1 TABLET(50 MG) BY MOUTH AT BEDTIME 30 tablet 2   triamcinolone ointment (KENALOG) 0.1 % Apply 1 Application topically daily as needed (itching).     No current facility-administered medications for this visit.     Past Medical History:  Diagnosis Date   AICD (automatic cardioverter/defibrillator) present    CHF (congestive heart failure) (HCC)    Chronic systolic heart failure (HCC)    a. Echo 12/05/11:  EF 20-25%, diff HK with mild sparing of IL wall, mild AI, mod MR, mild LAE, mild RVE, mild reduced RVF.;  b.  Echo 05/11/2012:  Mild LVH, EF 20-25%, Gr 1 diast dysfn, mod MR, mild LAE    Depression    G6PD deficiency    GERD (gastroesophageal reflux disease)    Hepatitis    Hep C - treated.   HIV infection (HCC)    Hypertension    NICM (nonischemic cardiomyopathy) (HCC)    cardia CTA 8/13 negative for obstructive CAD   Pneumonia    Presence of permanent cardiac pacemaker    Stroke (HCC) 12/11/2021   VT (ventricular tachycardia) (HCC)     ROS:   All systems reviewed and negative except as noted in the HPI.   Past Surgical History:  Procedure Laterality Date   BI-VENTRICULAR PACEMAKER INSERTION N/A 06/26/2013   Procedure: BI-VENTRICULAR PACEMAKER INSERTION (CRT-P);  Surgeon: Marinus Maw, MD;  Location: [facility] CATH LAB;  Service: Cardiovascular;  Laterality: N/A;   BIV ICD GENERATOR CHANGEOUT N/A 01/24/2021   Procedure: BIV ICD GENERATOR CHANGEOUT;  Surgeon: Marinus Maw, MD;  Location: [facility] INVASIVE CV LAB;  Service: Cardiovascular;  Laterality: N/A;   COLONOSCOPY WITH PROPOFOL N/A 08/14/2016   Procedure: COLONOSCOPY WITH PROPOFOL;  Surgeon: Jeani Hawking, MD;  Location: [facility] ENDOSCOPY;  Service: Endoscopy;  Laterality: N/A;   ELECTROPHYSIOLOGY STUDY N/A 02/19/2012   Procedure: ELECTROPHYSIOLOGY STUDY;  Surgeon: Marinus Maw, MD;  Location: [facility] CATH LAB;  Service: Cardiovascular;  Laterality: N/A;   ESOPHAGOGASTRODUODENOSCOPY  12/07/2011   Procedure: ESOPHAGOGASTRODUODENOSCOPY (EGD);  Surgeon: [name], MD,FACG;  Location: [facility] ENDOSCOPY;  Service: Endoscopy;  Laterality: N/A;   FINGER SURGERY     Thumb laceration.     HARDWARE REMOVAL Right 07/01/2018   Procedure: HARDWARE REMOVAL;  Surgeon: Sheral Apley, MD;  Location: [facility];  Service: Orthopedics;  Laterality: Right;   INCISION AND DRAINAGE Right 07/01/2018   Procedure: INCISION AND DRAINAGE;  Surgeon: Sheral Apley, MD;  Location: [facility];  Service: Orthopedics;  Laterality: Right;   LEAD EXTRACTION N/A 11/16/2022   Procedure: LEAD EXTRACTION;  Surgeon: Marinus Maw, MD;  Location: [facility] INVASIVE CV LAB;  Service: Cardiovascular;  Laterality: N/A;   LEFT HEART CATHETERIZATION WITH CORONARY ANGIOGRAM N/A 01/31/2014   Procedure: LEFT HEART CATHETERIZATION WITH CORONARY ANGIOGRAM;  Surgeon: Iran Ouch, MD;  Location: [facility] CATH LAB;  Service: Cardiovascular;  Laterality: N/A;   ORIF ANKLE FRACTURE Right 01/26/2017   Procedure: OPEN REDUCTION INTERNAL FIXATION (ORIF) ANKLE FRACTURE;  Surgeon: Sheral Apley, MD;  Location: [facility] OR;  Service: Orthopedics;  Laterality: Right;   TEE WITHOUT CARDIOVERSION N/A 11/16/2022   Procedure: TRANSESOPHAGEAL ECHOCARDIOGRAM;  Surgeon: Marinus Maw, MD;  Location: [facility] INVASIVE CV LAB;  Service: Cardiovascular;  Laterality: N/A;  Family History  Problem Relation Age of Onset   Lung cancer Mother    Colon cancer Other 51     Social History   Socioeconomic History   Marital status: Single    Spouse name: Not on file   Number of children: Not on file   Years of education: Not on file   Highest education level: Not on file  Occupational History   Occupation: Unemployed  Tobacco Use   Smoking status: Never   Smokeless tobacco: Never  Vaping Use   Vaping status: Never Used  Substance and Sexual Activity   Alcohol use: Yes    Alcohol/week: 4.0 standard drinks of alcohol    Types: 4 Cans of beer per week    Comment: occ beer   Drug use: Not Currently    Types: Marijuana   Sexual activity: Yes    Partners: Male    Comment: declined condoms  Other Topics Concern   Not on file  Social History Narrative   Lives alone.  Drinks beer daily.  Liquor rarely.  He occasionally smokes marijuana..   Social Determinants of Health   Financial Resource Strain: High Risk (02/05/2022)   Received from [facility]   Overall Financial Resource Strain (CARDIA)    Difficulty of Paying Living Expenses: Very hard  Food Insecurity: Medium Risk (04/20/2022)   [facility]   Hunger  Vital Sign    Worried About Running Out of Food in the Last Year: Sometimes true    Ran Out of Food in the Last Year: Sometimes true  Transportation Needs: No Transportation Needs (04/20/2022)   [facility]   Transportation    In the past 12 months, has lack of reliable transportation kept you from medical appointments, meetings, work or from getting things needed for daily living? : No  Physical Activity: Not on file  Stress: Not on file  Social Connections: Not on file  Intimate Partner Violence: Not on file     BP 126/76   Pulse 68   Ht 5' 4.5" (1.638 m)   Wt 166 lb 12.8 oz (75.7 kg)   SpO2 96%   BMI 28.19 kg/m   Physical Exam:  Well appearing NAD HEENT: Unremarkable Neck:  No JVD, no thyromegally Lymphatics:  No adenopathy Back:  No CVA tenderness Lungs:  Clear with no wheezes HEART:  Regular rate rhythm, no murmurs, no rubs, no clicks Abd:  soft, positive bowel sounds, no organomegally, no rebound, no guarding Ext:  2 plus pulses, no edema, no cyanosis, no clubbing Skin:  No rashes no nodules Neuro:  CN II through XII intact, motor grossly intact  EKG - nsr with ventricular pacing  DEVICE  Normal device function.  See PaceArt for details.   Assess/Plan:  1. Chronic systolic heart failure - his symptoms remain class 2. He will continue his current meds. His optivol is up and I asked him to increase lasix to 40 mg daily. 2. VT - he has maintained NSR on low dose amiodarone  3. ETOH abuse - he has not consumed excess ETOH. He drinks rarely. I encouraged him to never drink more than 2 beers a day or 7 in a week. 4. ICD - He is s/p medtronic Biv ICD revision with removal of his old ICD lead which had developed and elevated shocking impedence.  He has had a left bundle lead inserted as well.   Sharlot Gowda Shavawn Stobaugh,MD

## 2023-03-19 ENCOUNTER — Telehealth: Payer: Self-pay | Admitting: Internal Medicine

## 2023-03-19 NOTE — Telephone Encounter (Signed)
Patient states a [employer] On the Go monitor has been ordered and he would like to know if it will interfere with his ICD. Please advise.

## 2023-03-22 NOTE — Telephone Encounter (Signed)
Patient states this device is a medical alert. Advised patient he is fine to use the medical alert device.   Patient also reports of possible stitch at end of incision. Device clinic apt made 03/24/23 @ 12:00 PM. Pt aware of location, date and time.

## 2023-03-23 ENCOUNTER — Telehealth: Payer: Self-pay | Admitting: Internal Medicine

## 2023-03-23 NOTE — Telephone Encounter (Signed)
Pt called in to cancel his wound check appt from 03/24/23 for possible stitches. Pt states that the stitches dissolved on their own and he no longer needs this appt.   Cassie

## 2023-03-24 ENCOUNTER — Ambulatory Visit: Payer: 59

## 2023-03-29 ENCOUNTER — Telehealth: Payer: Self-pay

## 2023-03-29 ENCOUNTER — Ambulatory Visit: Payer: 59 | Attending: Internal Medicine

## 2023-03-29 DIAGNOSIS — Z79899 Other long term (current) drug therapy: Secondary | ICD-10-CM

## 2023-03-29 DIAGNOSIS — I472 Ventricular tachycardia, unspecified: Secondary | ICD-10-CM

## 2023-03-29 NOTE — Telephone Encounter (Signed)
Patient walked in for lab work today.  While in office, he requests wound site check where he had lead revision in June 2024.  Since change out, he has required return for stitch removal and subsequent antibiotics early on post op.   Patient states since area occasionally has 2 small areas that scab and then go away, he has been concerned.  I evaluated wound site today.  Wound edges are approximated, there is noted red irritation at proximal end of incision line with 2 very tiny scab/scaley areas noted.  I cleansed area with alcohol pad and able to remove the scab without difficulty or discomfort.  There is no pain and no signs of stitch or open area / tunneling noted.  Wound edges well approximated, no drainage no signs of infection.  Dr. Nelly Laurence consulted for his opinion.  He does note the irritation but does not have concern for abx.  Possibly stitch underneath causing some irritation but they should be dissolving.  There is nothing that he can visibly see to treat or attempt removal.    Plan: Determined nothing concerning at this point.  Patient is to continue to monitor the area and call the device clinic if any further concerns.

## 2023-03-30 LAB — BASIC METABOLIC PANEL
BUN/Creatinine Ratio: 19 (ref 9–20)
BUN: 23 mg/dL (ref 6–24)
CO2: 20 mmol/L (ref 20–29)
Calcium: 9.9 mg/dL (ref 8.7–10.2)
Chloride: 109 mmol/L — ABNORMAL HIGH (ref 96–106)
Creatinine, Ser: 1.22 mg/dL (ref 0.76–1.27)
Glucose: 99 mg/dL (ref 70–99)
Potassium: 4.3 mmol/L (ref 3.5–5.2)
Sodium: 143 mmol/L (ref 134–144)
eGFR: 68 mL/min/{1.73_m2} (ref >59)

## 2023-04-12 ENCOUNTER — Other Ambulatory Visit: Payer: Self-pay | Admitting: Nurse Practitioner

## 2023-04-12 DIAGNOSIS — K7469 Other cirrhosis of liver: Secondary | ICD-10-CM

## 2023-04-13 ENCOUNTER — Telehealth: Payer: Self-pay | Admitting: Internal Medicine

## 2023-04-13 NOTE — Telephone Encounter (Signed)
Spoke with pt. Per Canary Brim, NP told pt to go back to 20mg  of lasix daily and follow up in 2 weeks. Gave pt dates/times on 11/26-27 and 12/2. Pt stated he would call back to set appointment with EP APP tomorrow once he knew he had transportation.

## 2023-04-13 NOTE — Telephone Encounter (Signed)
Pt c/o medication issue:  1. Name of Medication: furosemide (LASIX) 40 MG tablet   spironolactone (ALDACTONE) 25 MG tablet   2. How are you currently taking this medication (dosage and times per day)? As written  3. Are you having a reaction (difficulty breathing--STAT)? No  4. What is your medication issue?patient is calling because his dosage for the Lasix was changed to 40 mg instead of the 20 mg. Patient stated that his hepatologist had seen that his BP was 90/72, they retook the BP manually and it was 100/70. Patient was informed to not take both of these medications yesterday and today. Patient was calling to see if we need to change the dosage amount since his BP has been low. Please advise.

## 2023-04-19 ENCOUNTER — Ambulatory Visit
Admission: RE | Admit: 2023-04-19 | Discharge: 2023-04-19 | Disposition: A | Discharge status: Home / Self Care | Payer: 59 | Source: Ambulatory Visit | Attending: Nurse Practitioner | Admitting: Nurse Practitioner

## 2023-04-19 DIAGNOSIS — K7469 Other cirrhosis of liver: Secondary | ICD-10-CM

## 2023-04-27 ENCOUNTER — Ambulatory Visit: Payer: Medicare Other

## 2023-05-19 ENCOUNTER — Ambulatory Visit (INDEPENDENT_AMBULATORY_CARE_PROVIDER_SITE_OTHER): Payer: 59

## 2023-05-19 DIAGNOSIS — I428 Other cardiomyopathies: Secondary | ICD-10-CM

## 2023-05-19 DIAGNOSIS — I472 Ventricular tachycardia, unspecified: Secondary | ICD-10-CM

## 2023-05-19 LAB — CUP PACEART REMOTE DEVICE CHECK
Battery Remaining Longevity: 65 mo
Battery Voltage: 2.98 V
Brady Statistic AP VP Percent: 1.54 %
Brady Statistic AP VS Percent: 0.02 %
Brady Statistic AS VP Percent: 96.95 %
Brady Statistic AS VS Percent: 1.49 %
Brady Statistic RA Percent Paced: 1.55 %
Brady Statistic RV Percent Paced: 0.88 %
Date Time Interrogation Session: 20241218042506
HighPow Impedance: 77 Ohm
Implantable Lead Connection Status: 753985
Implantable Lead Connection Status: 753985
Implantable Lead Connection Status: 753985
Implantable Lead Implant Date: 20150126
Implantable Lead Implant Date: 20240617
Implantable Lead Implant Date: 20240617
Implantable Lead Location: 753859
Implantable Lead Location: 753860
Implantable Lead Location: 753860
Implantable Lead Model: 3830
Implantable Lead Model: 5076
Implantable Lead Model: 6935
Implantable Pulse Generator Implant Date: 20220826
Lead Channel Impedance Value: 209 Ohm
Lead Channel Impedance Value: 342 Ohm
Lead Channel Impedance Value: 399 Ohm
Lead Channel Impedance Value: 399 Ohm
Lead Channel Impedance Value: 513 Ohm
Lead Channel Impedance Value: 532 Ohm
Lead Channel Pacing Threshold Amplitude: 0.375 V
Lead Channel Pacing Threshold Amplitude: 0.5 V
Lead Channel Pacing Threshold Amplitude: 0.875 V
Lead Channel Pacing Threshold Pulse Width: 0.4 ms
Lead Channel Pacing Threshold Pulse Width: 0.4 ms
Lead Channel Pacing Threshold Pulse Width: 0.6 ms
Lead Channel Sensing Intrinsic Amplitude: 1 mV
Lead Channel Sensing Intrinsic Amplitude: 1 mV
Lead Channel Sensing Intrinsic Amplitude: 9.375 mV
Lead Channel Sensing Intrinsic Amplitude: 9.375 mV
Lead Channel Setting Pacing Amplitude: 1.5 V
Lead Channel Setting Pacing Amplitude: 2 V
Lead Channel Setting Pacing Amplitude: 2.5 V
Lead Channel Setting Pacing Pulse Width: 0.4 ms
Lead Channel Setting Pacing Pulse Width: 0.6 ms
Lead Channel Setting Sensing Sensitivity: 0.3 mV
Zone Setting Status: 755011
Zone Setting Status: 755011

## 2023-05-20 ENCOUNTER — Other Ambulatory Visit (HOSPITAL_COMMUNITY): Payer: Self-pay

## 2023-05-20 DIAGNOSIS — I5022 Chronic systolic (congestive) heart failure: Secondary | ICD-10-CM

## 2023-05-20 MED ORDER — ENTRESTO 97-103 MG PO TABS: 1.0000 | ORAL_TABLET | Freq: Two times a day (BID) | ORAL | 0 refills | Status: DC

## 2023-06-11 ENCOUNTER — Other Ambulatory Visit: Payer: Self-pay

## 2023-06-11 ENCOUNTER — Ambulatory Visit (INDEPENDENT_AMBULATORY_CARE_PROVIDER_SITE_OTHER): Payer: 59 | Admitting: Internal Medicine

## 2023-06-11 ENCOUNTER — Encounter: Payer: Self-pay | Admitting: Internal Medicine

## 2023-06-11 VITALS — BP 123/84 | HR 71 | Ht 64.5 in | Wt 172.0 lb

## 2023-06-11 DIAGNOSIS — I428 Other cardiomyopathies: Secondary | ICD-10-CM

## 2023-06-11 DIAGNOSIS — B20 Human immunodeficiency virus [HIV] disease: Secondary | ICD-10-CM

## 2023-06-11 DIAGNOSIS — Z113 Encounter for screening for infections with a predominantly sexual mode of transmission: Secondary | ICD-10-CM

## 2023-06-11 DIAGNOSIS — Z21 Asymptomatic human immunodeficiency virus [HIV] infection status: Secondary | ICD-10-CM

## 2023-06-11 NOTE — Assessment & Plan Note (Signed)
 Screen today.  Condoms offered and given today.. Discussed anal Pap smear and he politely declined.

## 2023-06-11 NOTE — Assessment & Plan Note (Signed)
 Continues doing well on his regimen and no changes indicated.  Refills provided.  He will otherwise return in 6 months.

## 2023-06-11 NOTE — Progress Notes (Signed)
   Subjective:    Patient ID: [name], male    DOB: 28-Jul-1965, 58 y.o.   MRN: [id number]  HPI [name] here for follow-up of HIV. He continues on Odefsey  and dolutegravir  and denies any missed doses.  No new concerns.  He is more recently sexually active again.  No complaints today otherwise.   Review of Systems  Constitutional:  Negative for fatigue.  Gastrointestinal:  Negative for diarrhea.  Skin:  Negative for rash.       Objective:   Physical Exam Eyes:     General: No scleral icterus. Pulmonary:     Effort: Pulmonary effort is normal.  Neurological:     Mental Status: He is alert.   SH; no tobacco        Assessment & Plan:

## 2023-06-11 NOTE — Assessment & Plan Note (Signed)
 Form filled for his transportation needs.

## 2023-06-13 ENCOUNTER — Other Ambulatory Visit: Payer: Self-pay | Admitting: Family

## 2023-06-13 LAB — C. TRACHOMATIS/N. GONORRHOEAE RNA
C. trachomatis RNA, TMA: NOT DETECTED
N. gonorrhoeae RNA, TMA: NOT DETECTED

## 2023-06-14 LAB — T-HELPER CELLS (CD4) COUNT (NOT AT ARMC)
Absolute CD4: 1001 {cells}/uL (ref 490–1740)
CD4 T Helper %: 36 % (ref 30–61)
Total lymphocyte count: 2813 {cells}/uL (ref 850–3900)

## 2023-06-14 LAB — RPR: RPR Ser Ql: NONREACTIVE

## 2023-06-14 LAB — HIV-1 RNA QUANT-NO REFLEX-BLD
HIV 1 RNA Quant: NOT DETECTED {copies}/mL
HIV-1 RNA Quant, Log: NOT DETECTED {Log}

## 2023-06-15 ENCOUNTER — Other Ambulatory Visit: Payer: Self-pay | Admitting: Family

## 2023-06-17 ENCOUNTER — Telehealth: Payer: Self-pay

## 2023-06-17 NOTE — Telephone Encounter (Signed)
Patient called stating that two questions on transportation form was not filled out. Patient will bring paperwork back sometime next week.    Khiley Lieser Lesli Albee, CMA

## 2023-06-25 NOTE — Progress Notes (Signed)
Remote ICD transmission.

## 2023-06-30 ENCOUNTER — Encounter: Payer: Self-pay | Admitting: Internal Medicine

## 2023-06-30 ENCOUNTER — Other Ambulatory Visit: Payer: Self-pay

## 2023-06-30 ENCOUNTER — Ambulatory Visit (INDEPENDENT_AMBULATORY_CARE_PROVIDER_SITE_OTHER): Payer: 59 | Admitting: Internal Medicine

## 2023-06-30 VITALS — BP 106/75 | HR 74 | Resp 16 | Ht 64.5 in | Wt 171.2 lb

## 2023-06-30 DIAGNOSIS — I5022 Chronic systolic (congestive) heart failure: Secondary | ICD-10-CM

## 2023-06-30 NOTE — Progress Notes (Signed)
   Subjective:    Patient ID: [name], male    DOB: 1965/10/04, 58 y.o.   MRN: [id number]  HPI [name] is here for follow-up for paperwork. He continues to do well on his medication and no new issues.  He is here with paperwork for transportation.   Review of Systems  Constitutional:  Negative for fatigue.       Objective:   Physical Exam Pulmonary:     Effort: Pulmonary effort is normal.  Neurological:     Mental Status: He is alert.           Assessment & Plan:

## 2023-06-30 NOTE — Assessment & Plan Note (Signed)
Discussed paperwork with him in went over all the answers to completion.  I have personally spent 20 minutes involved in face-to-face and non-face-to-face activities for this patient on the day of the visit. Professional time spent includes the following activities: Preparing to see the patient (review of tests), Obtaining and/or reviewing separately obtained history (admission/discharge record), Performing a medically appropriate examination and/or evaluation , Ordering medications/tests/procedures, referring and communicating with other health care professionals, Documenting clinical information in the EMR, Independently interpreting results (not separately reported), Communicating results to the patient/family/caregiver, Counseling and educating the patient/family/caregiver and Care coordination (not separately reported).

## 2023-07-05 NOTE — Progress Notes (Signed)
 Patient ID: [name], male   DOB: Feb 17, 1966, 58 y.o.   MRN: [id number]   [facility] Note    PCP: Campbell Reynolds, NP Primary Cardiologist: Dr Waddell  HF Cardiologist: Dr. Cherrie    HPI: [name] is a 58 y.o.. male with HIV dx'd 1998, HCV, ETOH abuse and CHF due to NICM s/p Medtronic CRT-D (1/15). He also has a h/o syncope and previously had an EP study which was non-inducible for VT.    Admitted 8/14 for ADHF. Echo EF 20-25%.   Echo 10/16 EF 55%. Echo 10/21 EF 55-60%   Admitted 7/23 with stroke-like symptoms. CT head negative, echo showed EF 50-55%, mild to moderate TR, small pericardial effusion. 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   In 6/24 had lead extraction and replacement for elevated lead impedance.   Here for routine visit. Feels great. Active. Still on amio 100 daily. No further VT. No CP, edema, orthopnea or PND. Drinking a few beers per week.   ICD interrogation:No VT/AF Fluid was up all through November and December. Now back down. Activity level ~4h/day. Personally reviewed   Cardiac Studies - Echo (7/23): EF 50-55%, mild to moderate TR, small pericardial effusion. - Echo (10/21): EF 55-60% - Echo (8/18): EF 65-70%.  - Echo (12/17): EF 55% - Echo (10/16): EF 55% - Echo (5/15): EF 20-25%  - Echo (11/14): EF 20-25%, mod/severe MR, LA mod dilated  SH: Lives with partner in [place], drinks 2-3 12 oz beers daily.  Review of systems complete and found to be negative unless listed in HPI.    Family History  Problem Relation Age of Onset   Lung cancer Mother    Colon cancer Other 85   Past Medical History:  Diagnosis Date   AICD (automatic cardioverter/defibrillator) present    CHF (congestive heart failure) (HCC)    Chronic systolic heart failure (HCC)    a. Echo 12/05/11:  EF 20-25%, diff HK with mild sparing of IL wall, mild AI, mod MR, mild LAE, mild RVE, mild reduced RVF.;  b.  Echo 05/11/2012:  Mild LVH,  EF 20-25%, Gr 1 diast dysfn, mod MR, mild LAE   Depression    G6PD deficiency    GERD (gastroesophageal reflux disease)    Hepatitis    Hep C - treated.   HIV infection (HCC)    Hypertension    NICM (nonischemic cardiomyopathy) (HCC)    cardia CTA 8/13 negative for obstructive CAD   Pneumonia    Presence of permanent cardiac pacemaker    Stroke (HCC) 12/11/2021   VT (ventricular tachycardia) (HCC)    Current Outpatient Medications  Medication Sig Dispense Refill   acetaminophen  (TYLENOL ) 325 MG tablet Take 2 tablets (650 mg total) by mouth every 4 (four) hours as needed for mild pain (or temp > 37.5 C (99.5 F)).     allopurinol  (ZYLOPRIM ) 100 MG tablet TAKE 1 TABLET(100 MG) BY MOUTH TWICE DAILY 90 tablet 3   amiodarone  (PACERONE ) 200 MG tablet Take 0.5 tablets (100 mg total) by mouth daily. 45 tablet 3   carvedilol  (COREG ) 25 MG tablet Take 1 tablet (25 mg total) by mouth 2 (two) times daily with a meal. 180 tablet 3   cholecalciferol  (VITAMIN D ) 25 MCG (1000 UNIT) tablet Take 1,000 Units by mouth daily.     colchicine  0.6 MG tablet TAKE 1 TABLET(0.6 MG) BY MOUTH TWICE DAILY 120 tablet 5   diclofenac  Sodium (VOLTAREN  ARTHRITIS PAIN) 1 %  GEL Apply 2 g topically as needed.     dolutegravir  (TIVICAY ) 50 MG tablet TAKE 1 TABLET BY MOUTH DAILY 30 tablet 11   empagliflozin  (JARDIANCE ) 10 MG TABS tablet Take 1 tablet (10 mg total) by mouth daily. 30 tablet 11   emtricitabine -rilpivir-tenofovir  AF (ODEFSEY ) 200-25-25 MG TABS tablet Take 1 tablet by mouth daily. 30 tablet 11   fluticasone  (FLONASE ) 50 MCG/ACT nasal spray SHAKE LIQUID AND USE 2 SPRAYS IN EACH NOSTRIL DAILY 16 g 6   furosemide  (LASIX ) 20 MG tablet Take 20 mg by mouth daily.     gabapentin  (NEURONTIN ) 300 MG capsule Take 300 mg by mouth 3 (three) times daily.     Multiple Vitamins-Minerals (CENTRUM SILVER 50+MEN PO) Take 1 tablet by mouth daily.     Omega-3 Fatty Acids (FISH OIL) 1000 MG CPDR Take 1,000 mg by mouth daily.      ondansetron  (ZOFRAN ) 4 MG tablet Take 4 mg by mouth every 8 (eight) hours as needed for nausea or vomiting.  0   potassium chloride  SA (KLOR-CON  M) 20 MEQ tablet Take 1 tablet (20 mEq total) by mouth daily. 30 tablet 8   rosuvastatin  (CRESTOR ) 10 MG tablet Take 10 mg by mouth daily.     sacubitril -valsartan  (ENTRESTO ) 97-103 MG Take 1 tablet by mouth 2 (two) times daily. 60 tablet 0   spironolactone  (ALDACTONE ) 25 MG tablet Take 1 tablet (25 mg total) by mouth daily. 90 tablet 3   traZODone  (DESYREL ) 50 MG tablet TAKE 1 TABLET(50 MG) BY MOUTH AT BEDTIME 30 tablet 2   triamcinolone  ointment (KENALOG ) 0.1 % Apply 1 Application topically daily as needed (itching).     No current facility-administered medications for this encounter.   BP 110/70   Pulse 65   Wt 77.5 kg (170 lb 12.8 oz)   SpO2 95%   BMI 28.86 kg/m   Wt Readings from Last 3 Encounters:  07/06/23 77.5 kg (170 lb 12.8 oz)  06/30/23 77.7 kg (171 lb 3.2 oz)  06/11/23 78 kg (172 lb)    PHYSICAL EXAM: General:  Well appearing. No resp difficulty HEENT: normal Neck: supple. no JVD. Carotids 2+ bilat; no bruits. No lymphadenopathy or thryomegaly appreciated. Cor: PMI nondisplaced. Regular rate & rhythm. No rubs, gallops or murmurs. Lungs: clear Abdomen: soft, nontender, nondistended. No hepatosplenomegaly. No bruits or masses. Good bowel sounds. Extremities: no cyanosis, clubbing, rash, edema Neuro: alert & orientedx3, cranial nerves grossly intact. moves all 4 extremities w/o difficulty. Affect pleasant   Device interrogation (personally reviewed):  ICD interrogation:No VT/AF Fluid was up all through November and December. Now back down. Activity level ~4h/day. Personally reviewed  ASSESSMENT & PLAN:  1. Chronic systolic HF; now with recovered EF:  - Nonischemic cardiomyopathy, ?due to ETOH abuse.  - EF 20-25% in 2014.  - s/p Medtronic CRT-D (1/15).  - Echo (10/16): EF 55% - Echo (8/18): EF 65-70%.  - Echo (10/21): EF  55-60%  - Echo (7/23): EF 50-55%, RV ok, mild to moderate TR - Doing great NYHA I - Volume status was up in Nov and December now back down  - Continue Lasix  20 mg daily + 20 KCL daily.  - Continue Entresto  97/103 mg bid. - Continue Coreg  25 mg bid.   - Continue spiro 25 mg daily.  - Continue Jardiance  10 mg daily. - Recent labs reviewed and ok (drawn in [facility])  - Due for repeat echo  2. Syncope in 11/23 - Device interrogation showed no events, will  place Zio 2 week AT to quantify arrhythmia - Still wearing Zio XT - ? Vasovagal vs hydralazine  vs ETOH -> hydralazine  stopped -> resolved - no further episodes  3. HTN - BP looks great  4. Ventricular tachycardia  - Has ICD, followed Dr. Waddell  - No VT on ICD interrogation - Continue amio 100 mg daily.   5. HIV - Stable.  - Followed by Dr. Efrain  6. ETOH abuse  - Has cut back to 2 beers/week  78. 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 Continue statin. Off ASA due to bleeding   Toribio Fuel, MD  9:45 AM

## 2023-07-06 ENCOUNTER — Encounter (HOSPITAL_COMMUNITY): Payer: Self-pay | Admitting: Internal Medicine

## 2023-07-06 ENCOUNTER — Ambulatory Visit (HOSPITAL_COMMUNITY)
Admission: RE | Admit: 2023-07-06 | Discharge: 2023-07-06 | Disposition: A | Discharge status: Home / Self Care | Payer: 59 | Source: Ambulatory Visit | Attending: Internal Medicine | Admitting: Internal Medicine

## 2023-07-06 VITALS — BP 110/70 | HR 65 | Wt 170.8 lb

## 2023-07-06 DIAGNOSIS — Z79899 Other long term (current) drug therapy: Secondary | ICD-10-CM | POA: Insufficient documentation

## 2023-07-06 DIAGNOSIS — I5022 Chronic systolic (congestive) heart failure: Secondary | ICD-10-CM | POA: Insufficient documentation

## 2023-07-06 DIAGNOSIS — I472 Ventricular tachycardia, unspecified: Secondary | ICD-10-CM | POA: Insufficient documentation

## 2023-07-06 DIAGNOSIS — Z21 Asymptomatic human immunodeficiency virus [HIV] infection status: Secondary | ICD-10-CM | POA: Diagnosis not present

## 2023-07-06 DIAGNOSIS — F1010 Alcohol abuse, uncomplicated: Secondary | ICD-10-CM | POA: Insufficient documentation

## 2023-07-06 DIAGNOSIS — I110 Hypertensive heart disease with heart failure: Secondary | ICD-10-CM | POA: Insufficient documentation

## 2023-07-06 DIAGNOSIS — Z7984 Long term (current) use of oral hypoglycemic drugs: Secondary | ICD-10-CM | POA: Insufficient documentation

## 2023-07-06 DIAGNOSIS — Z95810 Presence of automatic (implantable) cardiac defibrillator: Secondary | ICD-10-CM | POA: Diagnosis not present

## 2023-07-06 DIAGNOSIS — Z8673 Personal history of transient ischemic attack (TIA), and cerebral infarction without residual deficits: Secondary | ICD-10-CM | POA: Insufficient documentation

## 2023-07-06 DIAGNOSIS — I10 Essential (primary) hypertension: Secondary | ICD-10-CM

## 2023-07-06 DIAGNOSIS — I428 Other cardiomyopathies: Secondary | ICD-10-CM | POA: Diagnosis not present

## 2023-07-06 DIAGNOSIS — G459 Transient cerebral ischemic attack, unspecified: Secondary | ICD-10-CM | POA: Diagnosis not present

## 2023-07-06 MED ORDER — AMIODARONE HCL 200 MG PO TABS: 100.0000 mg | ORAL_TABLET | Freq: Every day | ORAL | 3 refills | Status: AC

## 2023-07-06 MED ORDER — CARVEDILOL 25 MG PO TABS: 25.0000 mg | ORAL_TABLET | Freq: Two times a day (BID) | ORAL | 3 refills | Status: AC

## 2023-07-06 MED ORDER — SPIRONOLACTONE 25 MG PO TABS: 25.0000 mg | ORAL_TABLET | Freq: Every day | ORAL | 3 refills | Status: AC

## 2023-07-06 MED ORDER — FUROSEMIDE 20 MG PO TABS: 20.0000 mg | ORAL_TABLET | Freq: Every day | ORAL | 3 refills | Status: AC

## 2023-07-06 MED ORDER — POTASSIUM CHLORIDE CRYS ER 20 MEQ PO TBCR: 20.0000 meq | EXTENDED_RELEASE_TABLET | Freq: Every day | ORAL | 3 refills | Status: DC

## 2023-07-06 MED ORDER — ROSUVASTATIN CALCIUM 10 MG PO TABS: 10.0000 mg | ORAL_TABLET | Freq: Every day | ORAL | 3 refills | Status: DC

## 2023-07-06 MED ORDER — ENTRESTO 97-103 MG PO TABS: 1.0000 | ORAL_TABLET | Freq: Two times a day (BID) | ORAL | 3 refills | Status: DC

## 2023-07-06 MED ORDER — EMPAGLIFLOZIN 10 MG PO TABS: 10.0000 mg | ORAL_TABLET | Freq: Every day | ORAL | 3 refills | Status: AC

## 2023-07-06 NOTE — Patient Instructions (Signed)
 Great to see you today!!!  No changes, continue current medications  Your physician has requested that you have an echocardiogram. Echocardiography is a painless test that uses sound waves to create images of your heart. It provides your doctor with information about the size and shape of your heart and how well your heart's chambers and valves are working. This procedure takes approximately one hour. There are no restrictions for this procedure. Please do NOT wear cologne, perfume, aftershave, or lotions (deodorant is allowed). Please arrive 15 minutes prior to your appointment time.  Please note: We ask at that you not bring children with you during ultrasound (echo/ vascular) testing. Due to room size and safety concerns, children are not allowed in the ultrasound rooms during exams. Our front office staff cannot provide observation of children in our lobby area while testing is being conducted. An adult accompanying a patient to their appointment will only be allowed in the ultrasound room at the discretion of the ultrasound technician under special circumstances. We apologize for any inconvenience.   Your physician recommends that you schedule a follow-up appointment in: 1 year (Feb 2026), **PLEASE CALL OUR OFFICE IN DECEMBER TO SCHEDULE THIS APPOINTMENT   If you have any questions or concerns before your next appointment please send us  a message through [facility] or call our office at [phone].    TO LEAVE A MESSAGE FOR THE NURSE SELECT OPTION 2, PLEASE LEAVE A MESSAGE INCLUDING: YOUR NAME DATE OF BIRTH CALL BACK NUMBER REASON FOR CALL**this is important as we prioritize the call backs  YOU WILL RECEIVE A CALL BACK THE SAME DAY AS LONG AS YOU CALL BEFORE 4:00 PM  At the [facility], you and your health needs are our priority. As part of our continuing mission to provide you with exceptional heart care, we have created designated Provider Care Teams. These Care Teams  include your primary Cardiologist (physician) and Advanced Practice Providers (APPs- Physician Assistants and Nurse Practitioners) who all work together to provide you with the care you need, when you need it.   You may see any of the following providers on your designated Care Team at your next follow up: Dr Toribio Fuel Dr Ezra Shuck Dr. Ria Commander Dr. Morene Brownie Amy Lenetta, NP Caffie Shed, [place] [facility] [place], [place] Beckey Coe, NP Jordan Lee, NP Tinnie Redman, PharmD   Please be sure to bring in all your medications bottles to every appointment.    Thank you for choosing [facility]

## 2023-07-06 NOTE — Addendum Note (Signed)
Encounter addended by: Noralee Space, RN on: 07/06/2023 10:02 AM  Actions taken: Order list changed, Diagnosis association updated, Clinical Note Signed

## 2023-07-10 ENCOUNTER — Other Ambulatory Visit: Payer: Self-pay | Admitting: Internal Medicine

## 2023-07-10 DIAGNOSIS — B20 Human immunodeficiency virus [HIV] disease: Secondary | ICD-10-CM

## 2023-07-23 ENCOUNTER — Ambulatory Visit (HOSPITAL_COMMUNITY)
Admission: RE | Admit: 2023-07-23 | Discharge: 2023-07-23 | Disposition: A | Discharge status: Home / Self Care | Payer: 59 | Source: Ambulatory Visit | Attending: Internal Medicine | Admitting: Internal Medicine

## 2023-07-23 DIAGNOSIS — B20 Human immunodeficiency virus [HIV] disease: Secondary | ICD-10-CM | POA: Diagnosis not present

## 2023-07-23 DIAGNOSIS — I5022 Chronic systolic (congestive) heart failure: Secondary | ICD-10-CM | POA: Insufficient documentation

## 2023-07-23 DIAGNOSIS — I110 Hypertensive heart disease with heart failure: Secondary | ICD-10-CM | POA: Insufficient documentation

## 2023-07-23 LAB — ECHOCARDIOGRAM COMPLETE
AR max vel: 2.22 cm2
AV Area VTI: 2.3 cm2
AV Area mean vel: 2.04 cm2
AV Mean grad: 4 mm[Hg]
AV Peak grad: 7.6 mm[Hg]
Ao pk vel: 1.38 m/s
Area-P 1/2: 5.12 cm2
Calc EF: 43.8 %
MV VTI: 2.72 cm2
P 1/2 time: 543 ms
S' Lateral: 3.8 cm
Single Plane A2C EF: 46.2 %
Single Plane A4C EF: 43.6 %

## 2023-07-23 NOTE — Progress Notes (Signed)
*  PRELIMINARY RESULTS* Echocardiogram 2D Echocardiogram has been performed.  [name] 07/23/2023, 10:01 AM

## 2023-07-27 ENCOUNTER — Ambulatory Visit: Payer: Medicare Other

## 2023-07-30 ENCOUNTER — Telehealth (HOSPITAL_COMMUNITY): Payer: Self-pay

## 2023-07-30 ENCOUNTER — Telehealth: Payer: Self-pay | Admitting: Internal Medicine

## 2023-07-30 NOTE — Telephone Encounter (Signed)
*  STAT* If patient is at the pharmacy, call can be transferred to refill team.   1. Which medications need to be refilled? (please list name of each medication and dose if known)   amiodarone (PACERONE) 200 MG tablet   2. Would you like to learn more about the convenience, safety, & potential cost savings by using the [facility]?   3. Are you open to using the [facility] (Type [facility]. ).  4. Which pharmacy/location (including street and city if local pharmacy) is medication to be sent to?  [id number] - [place], [place] - [address] AT [address]   5. Do they need a 30 day or 90 day supply?   90 day  Patient stated he only has 2 tablets left.

## 2023-07-30 NOTE — Telephone Encounter (Signed)
 Received a fax requesting medical records from [place]. Records were successfully faxed to: [phone] ,which was the number provided.. Medical request form will be scanned into patients chart.

## 2023-08-02 NOTE — Telephone Encounter (Signed)
 Has been handled

## 2023-08-18 ENCOUNTER — Ambulatory Visit (INDEPENDENT_AMBULATORY_CARE_PROVIDER_SITE_OTHER): Payer: 59

## 2023-08-18 DIAGNOSIS — I428 Other cardiomyopathies: Secondary | ICD-10-CM | POA: Diagnosis not present

## 2023-08-20 LAB — CUP PACEART REMOTE DEVICE CHECK
Battery Remaining Longevity: 61 mo
Battery Voltage: 2.95 V
Brady Statistic AP VP Percent: 2.29 %
Brady Statistic AP VS Percent: 0.03 %
Brady Statistic AS VP Percent: 96.12 %
Brady Statistic AS VS Percent: 1.57 %
Brady Statistic RA Percent Paced: 2.3 %
Brady Statistic RV Percent Paced: 2.02 %
Date Time Interrogation Session: 20250320155138
HighPow Impedance: 81 Ohm
Implantable Lead Connection Status: 753985
Implantable Lead Connection Status: 753985
Implantable Lead Connection Status: 753985
Implantable Lead Implant Date: 20150126
Implantable Lead Implant Date: 20240617
Implantable Lead Implant Date: 20240617
Implantable Lead Location: 753859
Implantable Lead Location: 753860
Implantable Lead Location: 753860
Implantable Lead Model: 3830
Implantable Lead Model: 5076
Implantable Lead Model: 6935
Implantable Pulse Generator Implant Date: 20220826
Lead Channel Impedance Value: 209 Ohm
Lead Channel Impedance Value: 380 Ohm
Lead Channel Impedance Value: 456 Ohm
Lead Channel Impedance Value: 456 Ohm
Lead Channel Impedance Value: 532 Ohm
Lead Channel Impedance Value: 589 Ohm
Lead Channel Pacing Threshold Amplitude: 0.375 V
Lead Channel Pacing Threshold Amplitude: 0.5 V
Lead Channel Pacing Threshold Amplitude: 0.875 V
Lead Channel Pacing Threshold Pulse Width: 0.4 ms
Lead Channel Pacing Threshold Pulse Width: 0.4 ms
Lead Channel Pacing Threshold Pulse Width: 0.6 ms
Lead Channel Sensing Intrinsic Amplitude: 1.25 mV
Lead Channel Sensing Intrinsic Amplitude: 1.25 mV
Lead Channel Sensing Intrinsic Amplitude: 9.875 mV
Lead Channel Sensing Intrinsic Amplitude: 9.875 mV
Lead Channel Setting Pacing Amplitude: 1.5 V
Lead Channel Setting Pacing Amplitude: 2 V
Lead Channel Setting Pacing Amplitude: 2.5 V
Lead Channel Setting Pacing Pulse Width: 0.4 ms
Lead Channel Setting Pacing Pulse Width: 0.6 ms
Lead Channel Setting Sensing Sensitivity: 0.3 mV
Zone Setting Status: 755011
Zone Setting Status: 755011

## 2023-08-23 ENCOUNTER — Encounter: Payer: Self-pay | Admitting: Internal Medicine

## 2023-09-16 ENCOUNTER — Other Ambulatory Visit: Payer: Self-pay

## 2023-09-16 ENCOUNTER — Encounter (HOSPITAL_COMMUNITY): Payer: Self-pay | Admitting: Emergency Medicine

## 2023-09-16 ENCOUNTER — Ambulatory Visit (HOSPITAL_COMMUNITY)
Admission: EM | Admit: 2023-09-16 | Discharge: 2023-09-16 | Disposition: A | Discharge status: Home / Self Care | Attending: Emergency Medicine | Admitting: Emergency Medicine

## 2023-09-16 DIAGNOSIS — H00024 Hordeolum internum left upper eyelid: Secondary | ICD-10-CM

## 2023-09-16 DIAGNOSIS — L03213 Periorbital cellulitis: Secondary | ICD-10-CM

## 2023-09-16 MED ORDER — DOXYCYCLINE HYCLATE 100 MG PO TABS: 100.0000 mg | ORAL_TABLET | Freq: Two times a day (BID) | ORAL | 0 refills | Status: AC

## 2023-09-16 NOTE — ED Notes (Signed)
 Patient has questions about eye drops .  Verified with lindsay, no new drops prescribed.  Stop prednisolone eye drops.  If he wants, can use other eyedrops, but neither are needed.  Patient states understanding

## 2023-09-16 NOTE — ED Provider Notes (Signed)
 [facility]    CSN: [id number] Arrival date & time: 09/16/23  4098    HISTORY   Chief Complaint  Patient presents with   Eye Problem   HPI [name] is a pleasant, 58 y.o. male who presents to urgent care today. Patient complains of 4 to 5 days history of itching, redness and swelling of his left upper eyelid, states swelling has spread to lower lid at this time.  Patient states he had some leftover prednisone eyedrops and ofloxacin eyedrops from having cataract surgery a year ago, has been using both.  Patient states he has noticed this morning that he had excessive tearing and his eyelids were matted this morning.  Patient denies frank eye pain, vision changes.  Patient states he has had a stye in the past.  Patient states he has also been applying warm compresses but this has not helped.  The history is provided by the patient.   Past Medical History:  Diagnosis Date   AICD (automatic cardioverter/defibrillator) present    CHF (congestive heart failure) (HCC)    Chronic systolic heart failure (HCC)    a. Echo 12/05/11:  EF 20-25%, diff HK with mild sparing of IL wall, mild AI, mod MR, mild LAE, mild RVE, mild reduced RVF.;  b.  Echo 05/11/2012:  Mild LVH, EF 20-25%, Gr 1 diast dysfn, mod MR, mild LAE   Depression    G6PD deficiency    GERD (gastroesophageal reflux disease)    Hepatitis    Hep C - treated.   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   Blurred vision 12/12/2021   Encounter for screening examination for sexually transmitted disease 05/05/2021   Traumatic arthritis of right ankle    Acute idiopathic gout  involving toe of right foot    Septic arthritis of right ankle (HCC) 03/26/2021   Routine screening for STI (sexually transmitted infection) 05/01/2020   Mixed hyperlipidemia 11/26/2019   Cirrhosis (HCC) 05/15/2019   Need for prophylactic vaccination against Streptococcus pneumoniae (pneumococcus) 04/25/2018   Neurodermatitis 09/30/2017   Nonischemic cardiomyopathy (HCC)    HIV (human immunodeficiency virus infection) (HCC) 01/25/2017   Insomnia 06/26/2014   PTSD (post-traumatic stress disorder) 02/03/2014   ICD (implantable cardioverter-defibrillator), biventricular, in situ 01/23/2014   Paroxysmal ventricular fibrillation (HCC) 01/19/2014   Protein-calorie malnutrition, severe (HCC) 02/13/2013   GERD (gastroesophageal reflux disease) 08/19/2012   Chronic systolic heart failure (HCC) 01/25/2012   Alcohol abuse 11/30/2011   Genital herpes 06/09/2006   Chronic hepatitis C without hepatic coma (HCC) 06/09/2006   Primary hypertension 06/09/2006   Past Surgical History:  Procedure Laterality Date   BI-VENTRICULAR PACEMAKER INSERTION N/A 06/26/2013   Procedure: BI-VENTRICULAR PACEMAKER INSERTION (CRT-P);  Surgeon: Marinus Maw, MD;  Location: [facility] CATH LAB;  Service: Cardiovascular;  Laterality: N/A;   BIV ICD GENERATOR CHANGEOUT N/A 01/24/2021   Procedure: BIV ICD GENERATOR CHANGEOUT;  Surgeon: Marinus Maw, MD;  Location: [facility] INVASIVE CV LAB;  Service: Cardiovascular;  Laterality: N/A;   COLONOSCOPY WITH PROPOFOL N/A 08/14/2016   Procedure: COLONOSCOPY WITH PROPOFOL;  Surgeon: Jeani Hawking, MD;  Location: [facility]  ENDOSCOPY;  Service: Endoscopy;  Laterality: N/A;   ELECTROPHYSIOLOGY STUDY N/A 02/19/2012   Procedure: ELECTROPHYSIOLOGY STUDY;  Surgeon: Marinus Maw, MD;  Location: [facility] CATH LAB;  Service: Cardiovascular;  Laterality: N/A;   ESOPHAGOGASTRODUODENOSCOPY  12/07/2011   Procedure: ESOPHAGOGASTRODUODENOSCOPY (EGD);  Surgeon: [name], MD,FACG;  Location: [facility] ENDOSCOPY;  Service:  Endoscopy;  Laterality: N/A;   FINGER SURGERY     Thumb laceration.     HARDWARE REMOVAL Right 07/01/2018   Procedure: HARDWARE REMOVAL;  Surgeon: Sheral Apley, MD;  Location: [facility];  Service: Orthopedics;  Laterality: Right;   INCISION AND DRAINAGE Right 07/01/2018   Procedure: INCISION AND DRAINAGE;  Surgeon: Sheral Apley, MD;  Location: [facility];  Service: Orthopedics;  Laterality: Right;   LEAD EXTRACTION N/A 11/16/2022   Procedure: LEAD EXTRACTION;  Surgeon: Marinus Maw, MD;  Location: [facility] INVASIVE CV LAB;  Service: Cardiovascular;  Laterality: N/A;   LEFT HEART CATHETERIZATION WITH CORONARY ANGIOGRAM N/A 01/31/2014   Procedure: LEFT HEART CATHETERIZATION WITH CORONARY ANGIOGRAM;  Surgeon: Iran Ouch, MD;  Location: [facility] CATH LAB;  Service: Cardiovascular;  Laterality: N/A;   ORIF ANKLE FRACTURE Right 01/26/2017   Procedure: OPEN REDUCTION INTERNAL FIXATION (ORIF) ANKLE FRACTURE;  Surgeon: Sheral Apley, MD;  Location: [facility] OR;  Service: Orthopedics;  Laterality: Right;   TEE WITHOUT CARDIOVERSION N/A 11/16/2022   Procedure: TRANSESOPHAGEAL ECHOCARDIOGRAM;  Surgeon: Marinus Maw, MD;  Location: [facility] INVASIVE CV LAB;  Service: Cardiovascular;  Laterality: N/A;    Home Medications    Prior to Admission medications   Medication Sig Start Date End Date Taking? Authorizing Provider  acetaminophen (TYLENOL) 325 MG tablet Take 2 tablets (650 mg total) by mouth every 4 (four) hours as needed for mild pain (or temp > 37.5 C (99.5 F)). 12/15/21   Valetta Close, MD  allopurinol (ZYLOPRIM) 100 MG tablet TAKE 1 TABLET(100 MG) BY MOUTH TWICE DAILY 06/15/23   Adonis Huguenin, NP  amiodarone (PACERONE) 200 MG tablet Take 0.5 tablets (100 mg total) by mouth daily. 07/06/23   Bensimhon, Bevelyn Buckles, MD  carvedilol (COREG) 25 MG tablet Take 1 tablet (25 mg total) by mouth 2 (two) times daily with a meal. 07/06/23   Bensimhon, Bevelyn Buckles, MD  cholecalciferol  (VITAMIN D) 25 MCG (1000 UNIT) tablet Take 1,000 Units by mouth daily.    [provider]  colchicine 0.6 MG tablet TAKE 1 TABLET(0.6 MG) BY MOUTH TWICE DAILY 06/14/23   Nadara Mustard, MD  diclofenac Sodium (VOLTAREN ARTHRITIS PAIN) 1 % GEL Apply 2 g topically as needed.    [provider]  empagliflozin (JARDIANCE) 10 MG TABS tablet Take 1 tablet (10 mg total) by mouth daily. 07/06/23   Bensimhon, Bevelyn Buckles, MD  fluticasone (FLONASE) 50 MCG/ACT nasal spray SHAKE LIQUID AND USE 2 SPRAYS IN EACH NOSTRIL DAILY 01/17/21   Bess Kinds, MD  furosemide (LASIX) 20 MG tablet Take 1 tablet (20 mg total) by mouth daily. 07/06/23   Bensimhon, Bevelyn Buckles, MD  gabapentin (NEURONTIN) 300 MG capsule Take 300 mg by mouth 3 (three) times daily.    [provider]  Multiple Vitamins-Minerals (CENTRUM SILVER 50+MEN PO) Take 1 tablet by mouth daily.    [provider]  ODEFSEY 200-25-25 MG TABS tablet TAKE 1 TABLET BY MOUTH DAILY 07/12/23   Comer, [name], MD  Omega-3 Fatty Acids (FISH OIL) 1000 MG CPDR Take 1,000 mg by mouth daily.  [provider]  ondansetron (ZOFRAN) 4 MG tablet Take 4 mg by mouth every 8 (eight) hours as needed for nausea or vomiting. 03/26/17   [provider]  potassium chloride SA (KLOR-CON M) 20 MEQ tablet Take 1 tablet (20 mEq total) by mouth daily. 07/06/23   Bensimhon, Bevelyn Buckles, MD  rosuvastatin (CRESTOR) 10 MG tablet Take 1 tablet (10 mg total) by mouth daily. 07/06/23   Bensimhon, Bevelyn Buckles, MD  sacubitril-valsartan (ENTRESTO) 97-103 MG Take 1 tablet by mouth 2 (two) times daily. 07/06/23   Bensimhon, Bevelyn Buckles, MD  spironolactone (ALDACTONE) 25 MG tablet Take 1 tablet (25 mg total) by mouth daily. 07/06/23   Bensimhon, Bevelyn Buckles, MD  TIVICAY 50 MG tablet TAKE 1 TABLET BY MOUTH DAILY 07/12/23   [name], MD  traZODone (DESYREL) 50 MG tablet TAKE 1 TABLET(50 MG) BY MOUTH AT BEDTIME 01/19/22   Bess Kinds, MD  triamcinolone ointment  (KENALOG) 0.1 % Apply 1 Application topically daily as needed (itching). 01/08/22   [provider]    Family History Family History  Problem Relation Age of Onset   Lung cancer Mother    Colon cancer Other 60   Social History Social History   Tobacco Use   Smoking status: Never   Smokeless tobacco: Never  Vaping Use   Vaping status: Never Used  Substance Use Topics   Alcohol use: Yes    Alcohol/week: 4.0 standard drinks of alcohol    Types: 4 Cans of beer per week    Comment: occ beer   Drug use: Not Currently    Types: Marijuana   Allergies   Bactrim and Sulfamethoxazole-trimethoprim  Review of Systems Review of Systems Pertinent findings revealed after performing a 14 point review of systems has been noted in the history of present illness.  Physical Exam Vital Signs BP (!) 87/58 (BP Location: Right Arm)   Pulse 67   Temp 97.7 F (36.5 C) (Oral)   Resp 20   SpO2 93%   No data found.  Physical Exam Vitals and nursing note reviewed.  Constitutional:      General: He is not in acute distress.    Appearance: Normal appearance. He is normal weight. He is not ill-appearing.  HENT:     Head: Normocephalic and atraumatic.  Eyes:     General: Lids are everted, no foreign bodies appreciated.        Left eye: Hordeolum (Internal, medial aspect of left upper lid) present.No foreign body or discharge.     Extraocular Movements: Extraocular movements intact.     Conjunctiva/sclera:     Left eye: Left conjunctiva is injected. No chemosis, exudate or hemorrhage.    Pupils: Pupils are equal, round, and reactive to light.     Comments: Significant swelling of left upper and lower eyelids with erythema, tenderness to palpation  Cardiovascular:     Rate and Rhythm: Normal rate and regular rhythm.  Pulmonary:     Effort: Pulmonary effort is normal.     Breath sounds: Normal breath sounds.  Musculoskeletal:        General: Normal range of motion.     Cervical  back: Normal range of motion and neck supple.  Skin:    General: Skin is warm and dry.  Neurological:     General: No focal deficit present.     Mental Status: He is alert and oriented to person, place, and time. Mental status is at baseline.  Psychiatric:  Mood and Affect: Mood normal.        Behavior: Behavior normal.        Thought Content: Thought content normal.        Judgment: Judgment normal.     Visual Acuity Right Eye Distance:   Left Eye Distance:   Bilateral Distance:    Right Eye Near:   Left Eye Near:    Bilateral Near:     UC Couse / Diagnostics / Procedures:     Radiology No results found.  Procedures Procedures (including critical care time) EKG  Pending results:  Labs Reviewed - No data to display  Medications Ordered in UC: Medications - No data to display  UC Diagnoses / Final Clinical Impressions(s)   I have reviewed the triage vital signs and the nursing notes.  Pertinent labs & imaging results that were available during my care of the patient were reviewed by me and considered in my medical decision making (see chart for details).    Final diagnoses:  Hordeolum internum of left upper eyelid  Preseptal cellulitis of left upper eyelid   Will treat patient empirically for presumed preseptal cellulitis with a 7-day course of doxycycline which may also help address inflammation that caused the stye itself.  Recommend discontinue all eyedrops at this time and continue warm compresses.  Conservative care recommended.  Ophthalmology follow-up recommended if no improvement.  Please see discharge instructions below for details of plan of care as provided to patient. ED Prescriptions     Medication Sig Dispense Auth. Provider   doxycycline (VIBRA-TABS) 100 MG tablet Take 1 tablet (100 mg total) by mouth 2 (two) times daily for 7 days. 14 tablet Theadora Rama Scales, PA-C      PDMP not reviewed this encounter.  Pending results:  Labs  Reviewed - No data to display    Discharge Instructions      I have enclosed information about styes and how to care for these at home.  To treat the infection of the soft tissue around the stye in your left eye, please begin taking doxycycline 1 tablet twice daily for the next 7 days.  Please follow-up with your primary care provider or your cardiologist regarding your low blood pressure today.  Thank you for visiting [facility] today.      Disposition Upon Discharge:  Condition: stable for discharge home  Patient presented with an acute illness with associated systemic symptoms and significant discomfort requiring urgent management. In my opinion, this is a condition that a prudent lay person (someone who possesses an average knowledge of health and medicine) may potentially expect to result in complications if not addressed urgently such as respiratory distress, impairment of bodily function or dysfunction of bodily organs.   Routine symptom specific, illness specific and/or disease specific instructions were discussed with the patient and/or caregiver at length.   As such, the patient has been evaluated and assessed, work-up was performed and treatment was provided in alignment with urgent care protocols and evidence based medicine.  Patient/parent/caregiver has been advised that the patient may require follow up for further testing and treatment if the symptoms continue in spite of treatment, as clinically indicated and appropriate.  Patient/parent/caregiver has been advised to return to the [facility] or PCP if no better; to PCP or the Emergency Department if new signs and symptoms develop, or if the current signs or symptoms continue to change or worsen for further workup, evaluation and treatment as clinically indicated and appropriate  The patient will follow up with their current PCP if and as advised. If the patient does not currently have a PCP we will assist them in  obtaining one.   The patient may need specialty follow up if the symptoms continue, in spite of conservative treatment and management, for further workup, evaluation, consultation and treatment as clinically indicated and appropriate.  Patient/parent/caregiver verbalized understanding and agreement of plan as discussed.  All questions were addressed during visit.  Please see discharge instructions below for further details of plan.  This office note has been dictated using Teaching laboratory technician.  Unfortunately, this method of dictation can sometimes lead to typographical or grammatical errors.  I apologize for your inconvenience in advance if this occurs.  Please do not hesitate to reach out to me if clarification is needed.      Eloise Hake Scales, PA-C 09/16/23 1053

## 2023-09-16 NOTE — Discharge Instructions (Addendum)
 I have enclosed information about styes and how to care for these at home.  To treat the infection of the soft tissue around the stye in your left eye, please begin taking doxycycline 1 tablet twice daily for the next 7 days.  Please follow-up with your primary care provider or your cardiologist regarding your low blood pressure today.  Thank you for visiting [facility] today.

## 2023-09-16 NOTE — ED Triage Notes (Signed)
 Left upper eye lid and lower eye lid is red and inflamed.  Denies any vision changes.  Eyelid itching, redness and swelling for 4-5 days.  Swelling visible to cheek as well.  Prednisolone eye drops and ofloxacin eye drops that he received after cataract surgery performed on opposite eye a year ago-patient is using in left eye for current eye lid issues. Tearing and matting present per patient in the morning

## 2023-10-01 NOTE — Addendum Note (Signed)
 Addended by: Lott Rouleau A on: 10/01/2023 11:43 AM   Modules accepted: Orders

## 2023-10-01 NOTE — Progress Notes (Signed)
 Remote ICD transmission.

## 2023-10-22 ENCOUNTER — Other Ambulatory Visit: Payer: Self-pay | Admitting: Nurse Practitioner

## 2023-10-22 DIAGNOSIS — K7469 Other cirrhosis of liver: Secondary | ICD-10-CM

## 2023-10-27 ENCOUNTER — Ambulatory Visit
Admission: RE | Admit: 2023-10-27 | Discharge: 2023-10-27 | Discharge status: Home / Self Care | Source: Ambulatory Visit | Attending: Nurse Practitioner

## 2023-10-27 DIAGNOSIS — K7469 Other cirrhosis of liver: Secondary | ICD-10-CM

## 2023-10-29 NOTE — Progress Notes (Signed)
 The ASCVD Risk score (Arnett DK, et al., 2019) failed to calculate for the following reasons:   Risk score cannot be calculated because patient has a medical history suggesting prior/existing ASCVD  Arlon Bergamo, BSN, RN

## 2023-11-10 ENCOUNTER — Ambulatory Visit (INDEPENDENT_AMBULATORY_CARE_PROVIDER_SITE_OTHER): Admitting: Family

## 2023-11-10 ENCOUNTER — Other Ambulatory Visit: Payer: Self-pay

## 2023-11-10 ENCOUNTER — Encounter: Payer: Self-pay | Admitting: Family

## 2023-11-10 VITALS — BP 114/80 | HR 66 | Temp 98.0°F | Ht 64.5 in | Wt 164.0 lb

## 2023-11-10 DIAGNOSIS — K7460 Unspecified cirrhosis of liver: Secondary | ICD-10-CM

## 2023-11-10 DIAGNOSIS — Z79899 Other long term (current) drug therapy: Secondary | ICD-10-CM | POA: Diagnosis not present

## 2023-11-10 DIAGNOSIS — G629 Polyneuropathy, unspecified: Secondary | ICD-10-CM | POA: Diagnosis not present

## 2023-11-10 DIAGNOSIS — Z21 Asymptomatic human immunodeficiency virus [HIV] infection status: Secondary | ICD-10-CM | POA: Diagnosis not present

## 2023-11-10 DIAGNOSIS — Z0000 Encounter for general adult medical examination without abnormal findings: Secondary | ICD-10-CM | POA: Insufficient documentation

## 2023-11-10 LAB — CBC WITH DIFFERENTIAL/PLATELET
Absolute Lymphocytes: 2189 {cells}/uL (ref 850–3900)
Absolute Monocytes: 722 {cells}/uL (ref 200–950)
Basophils Absolute: 41 {cells}/uL (ref 0–200)
Basophils Relative: 0.5 %
Eosinophils Absolute: 107 {cells}/uL (ref 15–500)
Eosinophils Relative: 1.3 %
HCT: 41.2 % (ref 38.5–50.0)
Hemoglobin: 13.7 g/dL (ref 13.2–17.1)
MCH: 31.4 pg (ref 27.0–33.0)
MCHC: 33.3 g/dL (ref 32.0–36.0)
MCV: 94.5 fL (ref 80.0–100.0)
MPV: 10.1 fL (ref 7.5–12.5)
Monocytes Relative: 8.8 %
Neutro Abs: 5141 {cells}/uL (ref 1500–7800)
Neutrophils Relative %: 62.7 %
Platelets: 187 10*3/uL (ref 140–400)
RBC: 4.36 10*6/uL (ref 4.20–5.80)
RDW: 12.8 % (ref 11.0–15.0)
Total Lymphocyte: 26.7 %
WBC: 8.2 10*3/uL (ref 3.8–10.8)

## 2023-11-10 MED ORDER — TIVICAY 50 MG PO TABS: 50.0000 mg | ORAL_TABLET | Freq: Every day | ORAL | 4 refills | Status: DC

## 2023-11-10 MED ORDER — ODEFSEY 200-25-25 MG PO TABS: 1.0000 | ORAL_TABLET | Freq: Every day | ORAL | 4 refills | Status: DC

## 2023-11-10 NOTE — Assessment & Plan Note (Signed)
 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 Recommend continued management with gabapentin  for now. If symptoms worsen may need to consider imaging to rule out a neuroma or other pathology.

## 2023-11-10 NOTE — Progress Notes (Signed)
 Brief Narrative   Patient ID: [name], male    DOB: Apr 24, 1966, 58 y.o.   MRN: [id number]  [name] is a 58 y/o AA male diagnosed with HIV disease on 05/01/97 with risk factor of MSM. Initial viral load was 250,001 with CD4 count 120. Entered care at [facility] Stage 3. Genotype with M184V (emtricitabine /lamivudine). No history of opportunistic infection. Treated Hepatitis C. ART experienced with darunavir /r/emtricitabine /tenofovir  disoproxal fumerate,  etravirine /emtricitabine /tenofovir  disoproxil fumerate; Complara; and Odefsey /Tivicay .  Subjective:   Chief Complaint  Patient presents with   Follow-up    B20    HPI:  [name] is a 58 y.o. male with HIV disease last seen on 06/11/2023 with well-controlled virus and good adherence and tolerance to Odefsey  and Tivicay .  Viral load was undetectable with CD4 count 590.  Last renal function, within normal ranges.  Here today for routine follow-up.  [name] has been doing well since his last office visit and continues to take Odefsey  and Tivicay  as prescribed with no adverse side effects or problems obtaining medication from the pharmacy.  Covered by [employer] and Medicaid.  Asking about taking multiple medications versus 1 pill once daily.  Was recently seen by liver care and requesting lab work to be completed today.  Has been having mildly increased neuropathy located in his right foot for which she continues to take gabapentin  for.  Healthcare maintenance reviewed.  Condoms and site-specific STD testing.  Routine dental care is up-to-date per recommendations.   Denies fevers, chills, night sweats, headaches, changes in vision, neck pain/stiffness, nausea, diarrhea, vomiting, lesions or rashes.  Lab Results  Component Value Date   CD4TCELL 36 06/11/2023   CD4TABS 590 11/05/2022   Lab Results  Component Value Date   HIV1RNAQUANT Not Detected 06/11/2023     Allergies  Allergen Reactions   Bactrim Rash    Sulfamethoxazole -Trimethoprim Rash      Outpatient Medications Prior to Visit  Medication Sig Dispense Refill   acetaminophen  (TYLENOL ) 325 MG tablet Take 2 tablets (650 mg total) by mouth every 4 (four) hours as needed for mild pain (or temp > 37.5 C (99.5 F)).     allopurinol  (ZYLOPRIM ) 100 MG tablet TAKE 1 TABLET(100 MG) BY MOUTH TWICE DAILY 90 tablet 3   amiodarone  (PACERONE ) 200 MG tablet Take 0.5 tablets (100 mg total) by mouth daily. 45 tablet 3   carvedilol  (COREG ) 25 MG tablet Take 1 tablet (25 mg total) by mouth 2 (two) times daily with a meal. 180 tablet 3   cholecalciferol  (VITAMIN D ) 25 MCG (1000 UNIT) tablet Take 1,000 Units by mouth daily.     colchicine  0.6 MG tablet TAKE 1 TABLET(0.6 MG) BY MOUTH TWICE DAILY 120 tablet 5   diclofenac  Sodium (VOLTAREN  ARTHRITIS PAIN) 1 % GEL Apply 2 g topically as needed.     empagliflozin  (JARDIANCE ) 10 MG TABS tablet Take 1 tablet (10 mg total) by mouth daily. 90 tablet 3   fluticasone  (FLONASE ) 50 MCG/ACT nasal spray SHAKE LIQUID AND USE 2 SPRAYS IN EACH NOSTRIL DAILY 16 g 6   furosemide  (LASIX ) 20 MG tablet Take 1 tablet (20 mg total) by mouth daily. 90 tablet 3   gabapentin  (NEURONTIN ) 300 MG capsule Take 300 mg by mouth 3 (three) times daily.     Multiple Vitamins-Minerals (CENTRUM SILVER 50+MEN PO) Take 1 tablet by mouth daily.     Omega-3 Fatty Acids (FISH OIL) 1000 MG CPDR Take 1,000 mg by mouth daily.  ondansetron  (ZOFRAN ) 4 MG tablet Take 4 mg by mouth every 8 (eight) hours as needed for nausea or vomiting.  0   potassium chloride  SA (KLOR-CON  M) 20 MEQ tablet Take 1 tablet (20 mEq total) by mouth daily. 90 tablet 3   rosuvastatin  (CRESTOR ) 10 MG tablet Take 1 tablet (10 mg total) by mouth daily. 90 tablet 3   sacubitril -valsartan  (ENTRESTO ) 97-103 MG Take 1 tablet by mouth 2 (two) times daily. 180 tablet 3   spironolactone  (ALDACTONE ) 25 MG tablet Take 1 tablet (25 mg total) by mouth daily. 90 tablet 3   traZODone  (DESYREL )  50 MG tablet TAKE 1 TABLET(50 MG) BY MOUTH AT BEDTIME 30 tablet 2   triamcinolone  ointment (KENALOG ) 0.1 % Apply 1 Application topically daily as needed (itching).     ODEFSEY  200-25-25 MG TABS tablet TAKE 1 TABLET BY MOUTH DAILY 30 tablet 4   TIVICAY  50 MG tablet TAKE 1 TABLET BY MOUTH DAILY 30 tablet 4   No facility-administered medications prior to visit.     Past Medical History:  Diagnosis Date   AICD (automatic cardioverter/defibrillator) present    CHF (congestive heart failure) (HCC)    Chronic systolic heart failure (HCC)    a. Echo 12/05/11:  EF 20-25%, diff HK with mild sparing of IL wall, mild AI, mod MR, mild LAE, mild RVE, mild reduced RVF.;  b.  Echo 05/11/2012:  Mild LVH, EF 20-25%, Gr 1 diast dysfn, mod MR, mild LAE   Depression    G6PD deficiency    GERD (gastroesophageal reflux disease)    Hepatitis    Hep C - treated.   HIV infection (HCC)    Hypertension    NICM (nonischemic cardiomyopathy) (HCC)    cardia CTA 8/13 negative for obstructive CAD   Pneumonia    Presence of permanent cardiac pacemaker    Stroke (HCC) 12/11/2021   VT (ventricular tachycardia) [facility])      Past Surgical History:  Procedure Laterality Date   BI-VENTRICULAR PACEMAKER INSERTION N/A 06/26/2013   Procedure: BI-VENTRICULAR PACEMAKER INSERTION (CRT-P);  Surgeon: Tammie Fall, MD;  Location: [facility] CATH LAB;  Service: Cardiovascular;  Laterality: N/A;   BIV ICD GENERATOR CHANGEOUT N/A 01/24/2021   Procedure: BIV ICD GENERATOR CHANGEOUT;  Surgeon: Tammie Fall, MD;  Location[facility] INVASIVE CV LAB;  Service: Cardiovascular;  Laterality: N/A;   COLONOSCOPY WITH PROPOFOL  N/A 08/14/2016   Procedure: COLONOSCOPY WITH PROPOFOL ;  Surgeon: Alvis Jourdain, MD;  Location: [facility] ENDOSCOPY;  Service: Endoscopy;  Laterality: N/A;   ELECTROPHYSIOLOGY STUDY N/A 02/19/2012   Procedure: ELECTROPHYSIOLOGY STUDY;  Surgeon: Tammie Fall, MD;  Location: [facility] CATH LAB;  Service: Cardiovascular;  Laterality: N/A;    ESOPHAGOGASTRODUODENOSCOPY  12/07/2011   Procedure: ESOPHAGOGASTRODUODENOSCOPY (EGD);  Surgeon: Asencion Blacksmith, MD,FACG;  Location: [facility] ENDOSCOPY;  Service: Endoscopy;  Laterality: N/A;   FINGER SURGERY     Thumb laceration.     HARDWARE REMOVAL Right 07/01/2018   Procedure: HARDWARE REMOVAL;  Surgeon: Saundra Curl, MD;  Location: [facility];  Service: Orthopedics;  Laterality: Right;   INCISION AND DRAINAGE Right 07/01/2018   Procedure: INCISION AND DRAINAGE;  Surgeon: Saundra Curl, MD;  Location: [facility];  Service: Orthopedics;  Laterality: Right;   LEAD EXTRACTION N/A 11/16/2022   Procedure: LEAD EXTRACTION;  Surgeon: Tammie Fall, MD;  Location: [facility] INVASIVE CV LAB;  Service: Cardiovascular;  Laterality: N/A;   LEFT HEART CATHETERIZATION WITH CORONARY ANGIOGRAM N/A 01/31/2014  Procedure: LEFT HEART CATHETERIZATION WITH CORONARY ANGIOGRAM;  Surgeon: Wenona Hamilton, MD;  Location: [facility] CATH LAB;  Service: Cardiovascular;  Laterality: N/A;   ORIF ANKLE FRACTURE Right 01/26/2017   Procedure: OPEN REDUCTION INTERNAL FIXATION (ORIF) ANKLE FRACTURE;  Surgeon: Saundra Curl, MD;  Location: [facility] OR;  Service: Orthopedics;  Laterality: Right;   TEE WITHOUT CARDIOVERSION N/A 11/16/2022   Procedure: TRANSESOPHAGEAL ECHOCARDIOGRAM;  Surgeon: Tammie Fall, MD;  Location: [facility] INVASIVE CV LAB;  Service: Cardiovascular;  Laterality: N/A;        Review of Systems  Constitutional:  Negative for appetite change, chills, fatigue, fever and unexpected weight change.  Eyes:  Negative for visual disturbance.  Respiratory:  Negative for cough, chest tightness, shortness of breath and wheezing.   Cardiovascular:  Negative for chest pain and leg swelling.  Gastrointestinal:  Negative for abdominal pain, constipation, diarrhea, nausea and vomiting.  Genitourinary:  Negative for dysuria, flank pain, frequency, genital sores, hematuria and urgency.  Skin:  Negative for  rash.  Allergic/Immunologic: Negative for immunocompromised state.  Neurological:  Negative for dizziness and headaches.     Objective:   BP 114/80   Pulse 66   Temp 98 F (36.7 C) (Oral)   Ht 5' 4.5 (1.638 m)   Wt 164 lb (74.4 kg)   SpO2 95%   BMI 27.72 kg/m  Nursing note and vital signs reviewed.  Physical Exam Constitutional:      General: He is not in acute distress.    Appearance: He is well-developed.  Eyes:     Conjunctiva/sclera: Conjunctivae normal.  Cardiovascular:     Rate and Rhythm: Normal rate and regular rhythm.     Heart sounds: Normal heart sounds. No murmur heard.    No friction rub. No gallop.  Pulmonary:     Effort: Pulmonary effort is normal. No respiratory distress.     Breath sounds: Normal breath sounds. No wheezing or rales.  Chest:     Chest wall: No tenderness.  Abdominal:     General: Bowel sounds are normal.     Palpations: Abdomen is soft.     Tenderness: There is no abdominal tenderness.  Musculoskeletal:     Cervical back: Neck supple.  Lymphadenopathy:     Cervical: No cervical adenopathy.  Skin:    General: Skin is warm and dry.     Findings: No rash.  Neurological:     Mental Status: He is alert and oriented to person, place, and time.  Psychiatric:        Behavior: Behavior normal.        Thought Content: Thought content normal.        Judgment: Judgment normal.          11/10/2023   10:06 AM 06/30/2023    1:14 PM 06/11/2023    9:28 AM 11/05/2022    9:40 AM 05/19/2022   10:43 AM  Depression screen PHQ 2/9  Decreased Interest 0 0 0 0 0  Down, Depressed, Hopeless 0 0 0 0 0  PHQ - 2 Score 0 0 0 0 0  Altered sleeping 0      Tired, decreased energy 0      Change in appetite 0      Feeling bad or failure about yourself  0      Trouble concentrating 0      Moving slowly or fidgety/restless 0      Suicidal thoughts 0      PHQ-9 Score 0  11/10/2023   10:06 AM  GAD 7 : Generalized Anxiety Score  Nervous,  Anxious, on Edge 0  Control/stop worrying 0  Worry too much - different things 0  Trouble relaxing 0  Restless 0  Easily annoyed or irritable 0  Afraid - awful might happen 0  Total GAD 7 Score 0     The ASCVD Risk score (Arnett DK, et al., 2019) failed to calculate for the following reasons:   Risk score cannot be calculated because patient has a medical history suggesting prior/existing ASCVD      Assessment & Plan:    Patient Active Problem List   Diagnosis Date Noted   Healthcare maintenance 11/10/2023   Neuropathy 11/10/2023   Pacemaker lead failure, 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   Blurred vision 12/12/2021   Encounter for screening examination for sexually transmitted disease 05/05/2021   Traumatic arthritis of right ankle    Acute idiopathic gout involving toe of right foot    Septic arthritis of right ankle (HCC) 03/26/2021   Routine screening for STI (sexually transmitted infection) 05/01/2020   Mixed hyperlipidemia 11/26/2019   Cirrhosis (HCC) 05/15/2019   Need for prophylactic vaccination against Streptococcus pneumoniae (pneumococcus) 04/25/2018   Neurodermatitis 09/30/2017   Nonischemic cardiomyopathy (HCC)    HIV (human immunodeficiency virus infection) (HCC) 01/25/2017   Insomnia 06/26/2014   PTSD (post-traumatic stress disorder) 02/03/2014   ICD (implantable cardioverter-defibrillator), biventricular, in situ 01/23/2014   Paroxysmal ventricular fibrillation (HCC) 01/19/2014   Protein-calorie malnutrition, severe (HCC) 02/13/2013   GERD (gastroesophageal reflux disease) 08/19/2012   Chronic systolic heart failure (HCC) 01/25/2012   Alcohol abuse 11/30/2011   Genital herpes 06/09/2006   Chronic hepatitis C without hepatic coma (HCC) 06/09/2006   Primary hypertension 06/09/2006     Problem List Items Addressed This Visit       Digestive   Cirrhosis (HCC)   Followed by [facility] and requesting lab work to be completed  today. Note from [facility] reviewed and lab work order placed.       Relevant Orders   AFP tumor marker   Protime-INR   Hepatic function panel   CBC with Differential/Platelet     Nervous and Auditory   Neuropathy   Neuropathy of right foot and currently maintained on gabapentin . Recommend continued management with gabapentin  for now. If symptoms worsen may need to consider imaging to rule out a neuroma or other pathology.         Other   HIV (human immunodeficiency virus infection) (HCC) - Primary (Chronic)   Mr. Fi well continues to have well-controlled virus with good adherence and tolerance to Tivicay  and Odefsey .  Reviewed previous lab work and discussed plan of care and U equals U.  Covered by [employer] and Medicaid with no problems obtaining medication from the pharmacy.  Social determinants of health reviewed with no interventions indicated.  Check blood work.  Discussed possibilities of decreasing medication load and following discussion we will continue with Tivicay  and Odefsey . Plan for follow-up in 6 months or sooner if needed with lab work on the same day.      Relevant Medications   emtricitabine -rilpivir-tenofovir  AF (ODEFSEY ) 200-25-25 MG TABS tablet   dolutegravir  (TIVICAY ) 50 MG tablet   Other Relevant Orders   CBC with Differential/Platelet   Basic Metabolic Panel (BMET)   HIV-1 RNA quant-no reflex-bld   T-helper cells (CD4) count (not at [facility])   Healthcare maintenance   Discussed importance of safe  sexual practice and condom use. Condoms and site specific STD testing offered.  Vaccinations reviewed and following counseling declines Shingrix. Colon cancer screening up to date.  Dental care up to date.        Other Visit Diagnoses       Pharmacologic therapy       Relevant Orders   Lipid panel        I have changed Mer[facility] L. [place]'s Odefsey  and Tivicay . I am also having him maintain his ondansetron , fluticasone , Multiple  Vitamins-Minerals (CENTRUM SILVER 50+MEN PO), Fish Oil, cholecalciferol , gabapentin , acetaminophen , triamcinolone  ointment, traZODone , colchicine , allopurinol , diclofenac  Sodium, amiodarone , carvedilol , empagliflozin , furosemide , potassium chloride  SA, rosuvastatin , Entresto , and spironolactone .   Meds ordered this encounter  Medications   emtricitabine -rilpivir-tenofovir  AF (ODEFSEY ) 200-25-25 MG TABS tablet    Sig: Take 1 tablet by mouth daily.    Dispense:  30 tablet    Refill:  4    Fill with Tivicay     Supervising Provider:   SNIDER, CYNTHIA [id number]    Prescription Type::   Renewal   dolutegravir  (TIVICAY ) 50 MG tablet    Sig: Take 1 tablet (50 mg total) by mouth daily.    Dispense:  30 tablet    Refill:  4    Fill with Odefsey     Supervising Provider:   Liane Redman [phone]    Prescription Type::   Renewal     Follow-up: Return in about 6 months (around 05/11/2024). or sooner if needed.    Marlan Silva, MSN, FNP-C Nurse Practitioner [facility] Main number: [phone]

## 2023-11-10 NOTE — Patient Instructions (Addendum)
 Nice to see you. ? ?We will check your lab work today. ? ?Continue to take your medication daily as prescribed. ? ?Refills have been sent to the pharmacy. ? ?Plan for follow up in 6 months or sooner if needed with lab work on the same day. ? ?Have a great day and stay safe! ? ?

## 2023-11-10 NOTE — Assessment & Plan Note (Signed)
 [name] well continues to have well-controlled virus with good adherence and tolerance to Tivicay  and Odefsey .  Reviewed previous lab work and discussed plan of care and U equals U.  Covered by [employer] and Medicaid with no problems obtaining medication from the pharmacy.  Social determinants of health reviewed with no interventions indicated.  Check blood work.  Discussed possibilities of decreasing medication load and following discussion we will continue with Tivicay  and Odefsey . Plan for follow-up in 6 months or sooner if needed with lab work on the same day.

## 2023-11-10 NOTE — Assessment & Plan Note (Signed)
 Discussed importance of safe sexual practice and condom use. Condoms and site specific STD testing offered.  Vaccinations reviewed and following counseling declines Shingrix. Colon cancer screening up to date.  Dental care up to date.

## 2023-11-10 NOTE — Assessment & Plan Note (Signed)
 Followed by [facility] and requesting lab work to be completed today. Note from [facility] reviewed and lab work order placed.

## 2023-11-11 LAB — BASIC METABOLIC PANEL WITH GFR
BUN: 18 mg/dL (ref 7–25)
CO2: 24 mmol/L (ref 20–32)
Calcium: 9.6 mg/dL (ref 8.6–10.3)
Chloride: 107 mmol/L (ref 98–110)
Creat: 1.15 mg/dL (ref 0.70–1.35)
Glucose, Bld: 121 mg/dL — ABNORMAL HIGH (ref 65–99)
Potassium: 3.8 mmol/L (ref 3.5–5.3)
Sodium: 141 mmol/L (ref 135–146)
eGFR: 73 mL/min/{1.73_m2} (ref >=60)

## 2023-11-11 LAB — LIPID PANEL
Cholesterol: 209 mg/dL — ABNORMAL HIGH (ref <200)
HDL: 97 mg/dL (ref >=40)
LDL Cholesterol (Calc): 79 mg/dL
Non-HDL Cholesterol (Calc): 112 mg/dL (ref <130)
Total CHOL/HDL Ratio: 2.2 (calc) (ref <5.0)
Triglycerides: 236 mg/dL — ABNORMAL HIGH (ref <150)

## 2023-11-11 LAB — HEPATIC FUNCTION PANEL
AG Ratio: 1.5 (calc) (ref 1.0–2.5)
ALT: 26 U/L (ref 9–46)
AST: 31 U/L (ref 10–35)
Albumin: 4.9 g/dL (ref 3.6–5.1)
Alkaline phosphatase (APISO): 60 U/L (ref 35–144)
Bilirubin, Direct: 0.1 mg/dL (ref 0.0–0.2)
Globulin: 3.2 g/dL (ref 1.9–3.7)
Indirect Bilirubin: 0.4 mg/dL (ref 0.2–1.2)
Total Bilirubin: 0.5 mg/dL (ref 0.2–1.2)
Total Protein: 8.1 g/dL (ref 6.1–8.1)

## 2023-11-11 LAB — T-HELPER CELLS (CD4) COUNT (NOT AT ARMC)
Absolute CD4: 719 {cells}/uL (ref 490–1740)
CD4 T Helper %: 34 % (ref 30–61)
Total lymphocyte count: 2119 {cells}/uL (ref 850–3900)

## 2023-11-11 LAB — PROTIME-INR
INR: 1
Prothrombin Time: 10.5 s (ref 9.0–11.5)

## 2023-11-15 ENCOUNTER — Ambulatory Visit: Payer: Self-pay | Admitting: Family

## 2023-11-17 ENCOUNTER — Ambulatory Visit (INDEPENDENT_AMBULATORY_CARE_PROVIDER_SITE_OTHER): Payer: 59

## 2023-11-17 DIAGNOSIS — I428 Other cardiomyopathies: Secondary | ICD-10-CM

## 2023-11-19 LAB — CUP PACEART REMOTE DEVICE CHECK
Battery Remaining Longevity: 53 mo
Battery Voltage: 2.97 V
Brady Statistic AP VP Percent: 2.05 %
Brady Statistic AP VS Percent: 0.03 %
Brady Statistic AS VP Percent: 96.39 %
Brady Statistic AS VS Percent: 1.53 %
Brady Statistic RA Percent Paced: 2.07 %
Brady Statistic RV Percent Paced: 1.84 %
Date Time Interrogation Session: 20250619093427
HighPow Impedance: 65 Ohm
Implantable Lead Connection Status: 753985
Implantable Lead Connection Status: 753985
Implantable Lead Connection Status: 753985
Implantable Lead Implant Date: 20150126
Implantable Lead Implant Date: 20240617
Implantable Lead Implant Date: 20240617
Implantable Lead Location: 753859
Implantable Lead Location: 753860
Implantable Lead Location: 753860
Implantable Lead Model: 3830
Implantable Lead Model: 5076
Implantable Lead Model: 6935
Implantable Pulse Generator Implant Date: 20220826
Lead Channel Impedance Value: 247 Ohm
Lead Channel Impedance Value: 342 Ohm
Lead Channel Impedance Value: 380 Ohm
Lead Channel Impedance Value: 380 Ohm
Lead Channel Impedance Value: 513 Ohm
Lead Channel Impedance Value: 513 Ohm
Lead Channel Pacing Threshold Amplitude: 0.5 V
Lead Channel Pacing Threshold Amplitude: 0.5 V
Lead Channel Pacing Threshold Amplitude: 0.75 V
Lead Channel Pacing Threshold Pulse Width: 0.4 ms
Lead Channel Pacing Threshold Pulse Width: 0.4 ms
Lead Channel Pacing Threshold Pulse Width: 0.6 ms
Lead Channel Sensing Intrinsic Amplitude: 1.125 mV
Lead Channel Sensing Intrinsic Amplitude: 1.125 mV
Lead Channel Sensing Intrinsic Amplitude: 8 mV
Lead Channel Sensing Intrinsic Amplitude: 8 mV
Lead Channel Setting Pacing Amplitude: 1.5 V
Lead Channel Setting Pacing Amplitude: 2 V
Lead Channel Setting Pacing Amplitude: 2.5 V
Lead Channel Setting Pacing Pulse Width: 0.4 ms
Lead Channel Setting Pacing Pulse Width: 0.6 ms
Lead Channel Setting Sensing Sensitivity: 0.3 mV
Zone Setting Status: 755011
Zone Setting Status: 755011

## 2023-11-21 ENCOUNTER — Ambulatory Visit: Payer: Self-pay | Admitting: Internal Medicine

## 2023-12-08 ENCOUNTER — Other Ambulatory Visit: Payer: Self-pay | Admitting: Internal Medicine

## 2023-12-08 DIAGNOSIS — Z21 Asymptomatic human immunodeficiency virus [HIV] infection status: Secondary | ICD-10-CM

## 2023-12-21 ENCOUNTER — Telehealth: Payer: Self-pay

## 2023-12-21 NOTE — Telephone Encounter (Signed)
 Patient called stating he has jury duty coming up and does not feel he can go due to the pain he has in his feet. Patient is asking if he can have a letter for jury duty. Please advise.  [name], CMA

## 2023-12-22 NOTE — Telephone Encounter (Signed)
 Patient called office today to follow up on request for letter. States due to his diagnosis, ability not to drive, and distance of court house from his home address he is not able to make it to jury summon.  Letter is needed before 8/21. Understands staff will update him once provider addresses his request. [name] Code, RMA

## 2023-12-23 NOTE — Telephone Encounter (Signed)
 Pt would prefer to pick it up.

## 2023-12-24 ENCOUNTER — Encounter: Payer: Self-pay | Admitting: Family

## 2023-12-24 NOTE — Telephone Encounter (Signed)
 Left voicemail informing pt letter has been completed and ready for pickup.  Will leave with front desk staff. [name] Code, RMA

## 2024-01-07 ENCOUNTER — Encounter (HOSPITAL_COMMUNITY): Payer: Self-pay | Admitting: *Deleted

## 2024-01-20 ENCOUNTER — Emergency Department (HOSPITAL_COMMUNITY)

## 2024-01-20 ENCOUNTER — Encounter (HOSPITAL_COMMUNITY): Payer: Self-pay

## 2024-01-20 ENCOUNTER — Other Ambulatory Visit: Payer: Self-pay

## 2024-01-20 ENCOUNTER — Inpatient Hospital Stay (HOSPITAL_COMMUNITY)
Admission: RE | Admit: 2024-01-20 | Discharge: 2024-01-20 | Disposition: A | Discharge status: Home / Self Care | Source: Ambulatory Visit

## 2024-01-20 ENCOUNTER — Inpatient Hospital Stay (HOSPITAL_COMMUNITY)
Admission: EM | Admit: 2024-01-20 | Discharge: 2024-02-01 | DRG: 871 | Disposition: A | Discharge status: Home / Self Care | Source: Ambulatory Visit | Attending: Family Medicine | Admitting: Family Medicine

## 2024-01-20 ENCOUNTER — Inpatient Hospital Stay (HOSPITAL_COMMUNITY)

## 2024-01-20 ENCOUNTER — Telehealth (HOSPITAL_COMMUNITY): Payer: Self-pay | Admitting: Cardiology

## 2024-01-20 DIAGNOSIS — A072 Cryptosporidiosis: Secondary | ICD-10-CM | POA: Diagnosis present

## 2024-01-20 DIAGNOSIS — N179 Acute kidney failure, unspecified: Secondary | ICD-10-CM | POA: Diagnosis not present

## 2024-01-20 DIAGNOSIS — Z8673 Personal history of transient ischemic attack (TIA), and cerebral infarction without residual deficits: Secondary | ICD-10-CM

## 2024-01-20 DIAGNOSIS — I428 Other cardiomyopathies: Secondary | ICD-10-CM | POA: Diagnosis present

## 2024-01-20 DIAGNOSIS — E872 Acidosis, unspecified: Secondary | ICD-10-CM | POA: Diagnosis present

## 2024-01-20 DIAGNOSIS — Z560 Unemployment, unspecified: Secondary | ICD-10-CM

## 2024-01-20 DIAGNOSIS — Z7984 Long term (current) use of oral hypoglycemic drugs: Secondary | ICD-10-CM

## 2024-01-20 DIAGNOSIS — E861 Hypovolemia: Secondary | ICD-10-CM | POA: Diagnosis present

## 2024-01-20 DIAGNOSIS — Z95810 Presence of automatic (implantable) cardiac defibrillator: Secondary | ICD-10-CM

## 2024-01-20 DIAGNOSIS — R197 Diarrhea, unspecified: Secondary | ICD-10-CM | POA: Diagnosis not present

## 2024-01-20 DIAGNOSIS — B1920 Unspecified viral hepatitis C without hepatic coma: Secondary | ICD-10-CM | POA: Diagnosis present

## 2024-01-20 DIAGNOSIS — G9341 Metabolic encephalopathy: Secondary | ICD-10-CM | POA: Diagnosis not present

## 2024-01-20 DIAGNOSIS — I5022 Chronic systolic (congestive) heart failure: Secondary | ICD-10-CM | POA: Diagnosis present

## 2024-01-20 DIAGNOSIS — E860 Dehydration: Secondary | ICD-10-CM | POA: Diagnosis present

## 2024-01-20 DIAGNOSIS — A09 Infectious gastroenteritis and colitis, unspecified: Secondary | ICD-10-CM | POA: Diagnosis not present

## 2024-01-20 DIAGNOSIS — G629 Polyneuropathy, unspecified: Secondary | ICD-10-CM | POA: Diagnosis present

## 2024-01-20 DIAGNOSIS — I110 Hypertensive heart disease with heart failure: Secondary | ICD-10-CM | POA: Diagnosis present

## 2024-01-20 DIAGNOSIS — A419 Sepsis, unspecified organism: Secondary | ICD-10-CM | POA: Diagnosis present

## 2024-01-20 DIAGNOSIS — E8721 Acute metabolic acidosis: Secondary | ICD-10-CM | POA: Diagnosis not present

## 2024-01-20 DIAGNOSIS — N170 Acute kidney failure with tubular necrosis: Secondary | ICD-10-CM | POA: Diagnosis present

## 2024-01-20 DIAGNOSIS — E876 Hypokalemia: Secondary | ICD-10-CM | POA: Diagnosis present

## 2024-01-20 DIAGNOSIS — D696 Thrombocytopenia, unspecified: Secondary | ICD-10-CM | POA: Diagnosis present

## 2024-01-20 DIAGNOSIS — R55 Syncope and collapse: Secondary | ICD-10-CM | POA: Diagnosis present

## 2024-01-20 DIAGNOSIS — B20 Human immunodeficiency virus [HIV] disease: Secondary | ICD-10-CM | POA: Diagnosis present

## 2024-01-20 DIAGNOSIS — R6521 Severe sepsis with septic shock: Secondary | ICD-10-CM | POA: Diagnosis present

## 2024-01-20 DIAGNOSIS — K219 Gastro-esophageal reflux disease without esophagitis: Secondary | ICD-10-CM | POA: Diagnosis present

## 2024-01-20 DIAGNOSIS — D72810 Lymphocytopenia: Secondary | ICD-10-CM | POA: Diagnosis not present

## 2024-01-20 DIAGNOSIS — D72819 Decreased white blood cell count, unspecified: Secondary | ICD-10-CM | POA: Diagnosis present

## 2024-01-20 DIAGNOSIS — R571 Hypovolemic shock: Secondary | ICD-10-CM | POA: Diagnosis present

## 2024-01-20 DIAGNOSIS — Z21 Asymptomatic human immunodeficiency virus [HIV] infection status: Secondary | ICD-10-CM | POA: Diagnosis present

## 2024-01-20 DIAGNOSIS — Z800 Family history of malignant neoplasm of digestive organs: Secondary | ICD-10-CM

## 2024-01-20 DIAGNOSIS — A4189 Other specified sepsis: Secondary | ICD-10-CM | POA: Diagnosis present

## 2024-01-20 DIAGNOSIS — R578 Other shock: Secondary | ICD-10-CM | POA: Diagnosis not present

## 2024-01-20 DIAGNOSIS — D75A Glucose-6-phosphate dehydrogenase (G6PD) deficiency without anemia: Secondary | ICD-10-CM | POA: Diagnosis present

## 2024-01-20 DIAGNOSIS — I959 Hypotension, unspecified: Secondary | ICD-10-CM | POA: Diagnosis not present

## 2024-01-20 DIAGNOSIS — Z801 Family history of malignant neoplasm of trachea, bronchus and lung: Secondary | ICD-10-CM

## 2024-01-20 DIAGNOSIS — Z5986 Financial insecurity: Secondary | ICD-10-CM

## 2024-01-20 DIAGNOSIS — Z79899 Other long term (current) drug therapy: Secondary | ICD-10-CM | POA: Diagnosis not present

## 2024-01-20 DIAGNOSIS — Z882 Allergy status to sulfonamides status: Secondary | ICD-10-CM

## 2024-01-20 DIAGNOSIS — Z881 Allergy status to other antibiotic agents status: Secondary | ICD-10-CM

## 2024-01-20 LAB — C DIFFICILE QUICK SCREEN W PCR REFLEX
C Diff antigen: NEGATIVE
C Diff interpretation: NOT DETECTED
C Diff toxin: NEGATIVE

## 2024-01-20 LAB — URINALYSIS, ROUTINE W REFLEX MICROSCOPIC
Bilirubin Urine: NEGATIVE
Glucose, UA: 50 mg/dL — AB
Ketones, ur: 5 mg/dL — AB
Leukocytes,Ua: NEGATIVE
Nitrite: NEGATIVE
Protein, ur: 100 mg/dL — AB
Specific Gravity, Urine: 1.016 (ref 1.005–1.030)
pH: 5 (ref 5.0–8.0)

## 2024-01-20 LAB — COMPREHENSIVE METABOLIC PANEL WITH GFR
ALT: 26 U/L (ref 0–44)
AST: 40 U/L (ref 15–41)
Albumin: 4.5 g/dL (ref 3.5–5.0)
Alkaline Phosphatase: 75 U/L (ref 38–126)
Anion gap: 25 — ABNORMAL HIGH (ref 5–15)
BUN: 46 mg/dL — ABNORMAL HIGH (ref 6–20)
CO2: 14 mmol/L — ABNORMAL LOW (ref 22–32)
Calcium: 9.4 mg/dL (ref 8.9–10.3)
Chloride: 96 mmol/L — ABNORMAL LOW (ref 98–111)
Creatinine, Ser: 8.26 mg/dL — ABNORMAL HIGH (ref 0.61–1.24)
GFR, Estimated: 7 mL/min — ABNORMAL LOW (ref >60)
Glucose, Bld: 110 mg/dL — ABNORMAL HIGH (ref 70–99)
Potassium: 4.4 mmol/L (ref 3.5–5.1)
Sodium: 135 mmol/L (ref 135–145)
Total Bilirubin: 0.7 mg/dL (ref 0.0–1.2)
Total Protein: 9 g/dL — ABNORMAL HIGH (ref 6.5–8.1)

## 2024-01-20 LAB — BRAIN NATRIURETIC PEPTIDE: B Natriuretic Peptide: 16.1 pg/mL (ref 0.0–100.0)

## 2024-01-20 LAB — HEMOGLOBIN A1C
Hgb A1c MFr Bld: 5.5 % (ref 4.8–5.6)
Mean Plasma Glucose: 111.15 mg/dL

## 2024-01-20 LAB — I-STAT CHEM 8, ED
BUN: 46 mg/dL — ABNORMAL HIGH (ref 6–20)
Calcium, Ion: 1.13 mmol/L — ABNORMAL LOW (ref 1.15–1.40)
Chloride: 106 mmol/L (ref 98–111)
Creatinine, Ser: 8.9 mg/dL — ABNORMAL HIGH (ref 0.61–1.24)
Glucose, Bld: 103 mg/dL — ABNORMAL HIGH (ref 70–99)
HCT: 50 % (ref 39.0–52.0)
Hemoglobin: 17 g/dL (ref 13.0–17.0)
Potassium: 4.2 mmol/L (ref 3.5–5.1)
Sodium: 133 mmol/L — ABNORMAL LOW (ref 135–145)
TCO2: 15 mmol/L — ABNORMAL LOW (ref 22–32)

## 2024-01-20 LAB — CBC
HCT: 45.9 % (ref 39.0–52.0)
HCT: 39.4 % (ref 39.0–52.0)
Hemoglobin: 15.2 g/dL (ref 13.0–17.0)
Hemoglobin: 13 g/dL (ref 13.0–17.0)
MCH: 30.9 pg (ref 26.0–34.0)
MCH: 30.8 pg (ref 26.0–34.0)
MCHC: 33.1 g/dL (ref 30.0–36.0)
MCHC: 33 g/dL (ref 30.0–36.0)
MCV: 93.3 fL (ref 80.0–100.0)
MCV: 93.4 fL (ref 80.0–100.0)
Platelets: 205 K/uL (ref 150–400)
Platelets: 169 K/uL (ref 150–400)
RBC: 4.92 MIL/uL (ref 4.22–5.81)
RBC: 4.22 MIL/uL (ref 4.22–5.81)
RDW: 12.9 % (ref 11.5–15.5)
RDW: 12.7 % (ref 11.5–15.5)
WBC: 11.3 K/uL — ABNORMAL HIGH (ref 4.0–10.5)
WBC: 14.1 K/uL — ABNORMAL HIGH (ref 4.0–10.5)
nRBC: 0 % (ref 0.0–0.2)
nRBC: 0 % (ref 0.0–0.2)

## 2024-01-20 LAB — CREATININE, SERUM
Creatinine, Ser: 6.28 mg/dL — ABNORMAL HIGH (ref 0.61–1.24)
GFR, Estimated: 10 mL/min — ABNORMAL LOW (ref >60)

## 2024-01-20 LAB — POC OCCULT BLOOD, ED: Fecal Occult Bld: POSITIVE — AB

## 2024-01-20 LAB — PROTIME-INR
INR: 1 (ref 0.8–1.2)
Prothrombin Time: 14.1 s (ref 11.4–15.2)

## 2024-01-20 LAB — LIPASE, BLOOD: Lipase: 53 U/L — ABNORMAL HIGH (ref 11–51)

## 2024-01-20 LAB — MAGNESIUM: Magnesium: 2.8 mg/dL — ABNORMAL HIGH (ref 1.7–2.4)

## 2024-01-20 LAB — GLUCOSE, CAPILLARY: Glucose-Capillary: 92 mg/dL (ref 70–99)

## 2024-01-20 LAB — TYPE AND SCREEN
ABO/RH(D): O NEG
Antibody Screen: NEGATIVE

## 2024-01-20 LAB — I-STAT CG4 LACTIC ACID, ED: Lactic Acid, Venous: 1.4 mmol/L (ref 0.5–1.9)

## 2024-01-20 LAB — PHOSPHORUS: Phosphorus: 8.9 mg/dL — ABNORMAL HIGH (ref 2.5–4.6)

## 2024-01-20 MED ORDER — SODIUM CHLORIDE 0.9 % IV BOLUS
1000.0000 mL | Freq: Once | INTRAVENOUS | Status: AC
  Administered 2024-01-20: 1000 mL via INTRAVENOUS

## 2024-01-20 MED ORDER — LACTATED RINGERS IV SOLN: INTRAVENOUS | Status: DC

## 2024-01-20 MED ORDER — PANTOPRAZOLE SODIUM 40 MG IV SOLR
80.0000 mg | Freq: Once | INTRAVENOUS | Status: AC
  Administered 2024-01-20: 80 mg via INTRAVENOUS
  Filled 2024-01-20: qty 20

## 2024-01-20 MED ORDER — SODIUM CHLORIDE 0.9 % IV BOLUS: 1000.0000 mL | Freq: Once | INTRAVENOUS | Status: DC

## 2024-01-20 MED ORDER — SODIUM CHLORIDE 0.9 % IV SOLN
250.0000 mL | INTRAVENOUS | Status: AC
  Administered 2024-01-20: 250 mL via INTRAVENOUS

## 2024-01-20 MED ORDER — HEPARIN SODIUM (PORCINE) 5000 UNIT/ML IJ SOLN
5000.0000 [IU] | Freq: Three times a day (TID) | INTRAMUSCULAR | Status: DC
  Administered 2024-01-21 – 2024-01-31 (×32): 5000 [IU] via SUBCUTANEOUS
  Filled 2024-01-20 (×34): qty 1

## 2024-01-20 MED ORDER — CHLORHEXIDINE GLUCONATE CLOTH 2 % EX PADS
6.0000 | MEDICATED_PAD | Freq: Every day | CUTANEOUS | Status: DC
  Administered 2024-01-20 – 2024-01-21 (×2): 6 via TOPICAL

## 2024-01-20 MED ORDER — FAMOTIDINE IN NACL 20-0.9 MG/50ML-% IV SOLN
20.0000 mg | INTRAVENOUS | Status: DC
  Administered 2024-01-20 – 2024-01-30 (×11): 20 mg via INTRAVENOUS
  Filled 2024-01-20 (×12): qty 50

## 2024-01-20 MED ORDER — DOCUSATE SODIUM 100 MG PO CAPS: 100.0000 mg | ORAL_CAPSULE | Freq: Two times a day (BID) | ORAL | Status: DC | PRN

## 2024-01-20 MED ORDER — NOREPINEPHRINE 4 MG/250ML-% IV SOLN
0.0000 ug/min | INTRAVENOUS | Status: DC
  Administered 2024-01-20: 10 ug/min via INTRAVENOUS
  Filled 2024-01-20 (×2): qty 250

## 2024-01-20 MED ORDER — METRONIDAZOLE 500 MG/100ML IV SOLN
500.0000 mg | Freq: Once | INTRAVENOUS | Status: AC
  Administered 2024-01-20: 500 mg via INTRAVENOUS
  Filled 2024-01-20: qty 100

## 2024-01-20 MED ORDER — POLYETHYLENE GLYCOL 3350 17 G PO PACK: 17.0000 g | PACK | Freq: Every day | ORAL | Status: DC | PRN

## 2024-01-20 MED ORDER — SODIUM CHLORIDE 0.9 % IV SOLN: Freq: Once | INTRAVENOUS | Status: AC

## 2024-01-20 MED ORDER — SODIUM BICARBONATE 8.4 % IV SOLN
INTRAVENOUS | Status: DC
  Filled 2024-01-20: qty 1000

## 2024-01-20 MED ORDER — TRAZODONE HCL 50 MG PO TABS
50.0000 mg | ORAL_TABLET | Freq: Once | ORAL | Status: AC
  Administered 2024-01-20: 50 mg via ORAL
  Filled 2024-01-20: qty 1

## 2024-01-20 MED ORDER — VANCOMYCIN HCL 1500 MG/300ML IV SOLN
1500.0000 mg | Freq: Once | INTRAVENOUS | Status: AC
  Administered 2024-01-20: 1500 mg via INTRAVENOUS

## 2024-01-20 MED ORDER — LACTATED RINGERS IV BOLUS
1000.0000 mL | Freq: Once | INTRAVENOUS | Status: AC
  Administered 2024-01-20: 1000 mL via INTRAVENOUS

## 2024-01-20 MED ORDER — SODIUM CHLORIDE 0.9 % IV SOLN
2.0000 g | Freq: Once | INTRAVENOUS | Status: AC
  Administered 2024-01-20: 2 g via INTRAVENOUS
  Filled 2024-01-20: qty 12.5

## 2024-01-20 MED ORDER — INSULIN ASPART 100 UNIT/ML IJ SOLN: 0.0000 [IU] | INTRAMUSCULAR | Status: DC

## 2024-01-20 MED ORDER — NOREPINEPHRINE 4 MG/250ML-% IV SOLN
0.0000 ug/min | INTRAVENOUS | Status: DC
  Administered 2024-01-20: 7 ug/min via INTRAVENOUS
  Administered 2024-01-21: 9 ug/min via INTRAVENOUS
  Filled 2024-01-20: qty 250

## 2024-01-20 MED ORDER — LOPERAMIDE HCL 2 MG PO CAPS
2.0000 mg | ORAL_CAPSULE | ORAL | Status: DC | PRN
  Administered 2024-01-20 – 2024-01-21 (×2): 2 mg via ORAL
  Filled 2024-01-20 (×4): qty 1

## 2024-01-20 MED ORDER — VANCOMYCIN HCL 1500 MG/300ML IV SOLN
1500.0000 mg | Freq: Once | INTRAVENOUS | Status: DC
  Filled 2024-01-20: qty 300

## 2024-01-20 NOTE — ED Notes (Signed)
 Pt refusing any further attempts at IV start at this time, including US  Ivs. Charge nurse aware and speaking with pt.

## 2024-01-20 NOTE — ED Notes (Signed)
 Patient discovered to have had IV levophed  unhooked by other nurse, as patient had to use bathroom. Pt educated on importance of leaving IV in place, even while using bathroom as well.

## 2024-01-20 NOTE — Progress Notes (Signed)
 ED Pharmacy Antibiotic Sign Off An antibiotic consult was received from an ED provider for vancomycin  and cefepime  per pharmacy dosing for sepsis. A chart review was completed to assess appropriateness.   Vancomycin  1500mg  IV x1 Cefepime  2G IV x1  Further antibiotic and/or antibiotic pharmacy consults should be ordered by the admitting provider if indicated.   Thank you for allowing pharmacy to be a part of this patient's care.   Koren LITTIE Or, [facility]  Clinical Pharmacist 01/20/24 2:06 PM

## 2024-01-20 NOTE — Plan of Care (Signed)

## 2024-01-20 NOTE — Progress Notes (Signed)
 eLink Physician-Brief Progress Note Patient Name: AREND BAHL DOB: 1965/09/19 MRN: [id number]   Date of Service  01/20/2024  HPI/Events of Note  Patient admitted with syncope, suspected enteritis with volume depletion and hypovolemic shock resulting in acute kidney injury. Work up is in progress.  eICU Interventions  New Patient Evaluation.        Imanol Bihl U Jahna Liebert 01/20/2024, 11:19 PM

## 2024-01-20 NOTE — ED Notes (Addendum)
 Pt okay to have something to drink at this time, per Bernard, MD. Pt provided with water.

## 2024-01-20 NOTE — ED Notes (Signed)
Critical care doctor at bedside  

## 2024-01-20 NOTE — H&P (Addendum)
 NAME:  [name], MRN:  [id number], DOB:  1965-08-03, LOS: 0 ADMISSION DATE:  01/20/2024, CONSULTATION DATE:  01/20/2024 REFERRING MD:  Maude Galloway, MD, CHIEF COMPLAINT:  N/V/D x 4 days   History of Present Illness:  58 y/o male with PMH for HIV+ with no HIV related illness and CD4 719 and undetectable viral load, Treated Hep C, neuropathy, Bivetricular pacemaker/defibrillator, CHF EF 45-50% , GERD, HTN who presented with N/V/D for the last 4 days and fever to 100 at home.  He had 3 syncopal events and was to have cardiology f/u today outpatient and was advised to go to the ED for low BP.  He has made little urine since being in the ED and his labs revealed AKI with Cr 8.90, His BP has remained low throughout the day in the ED and he was started on peripheral Levophed  now at 5 mcg. Stool occult positive and negative for C diff.  Pertinent  Medical History  HIV+ with no HIV related illness and CD4 719 and undetectable viral load, Treated Hep C, neuropathy, Bivetricular pacemaker/defibrillator, CHF EF 45-50% , GERD, HTN   Significant Hospital Events: Including procedures, antibiotic start and stop dates in addition to other pertinent events   8/21: Admit to ICU  Interim History / Subjective:  N/a  Objective    Blood pressure (!) 82/55, pulse 87, temperature 98.2 F (36.8 C), temperature source Oral, resp. rate (!) 21, height 5' 4 (1.626 m), weight 80 kg, SpO2 100%.       No intake or output data in the 24 hours ending 01/20/24 2104 Filed Weights   01/20/24 1400  Weight: 80 kg    Examination: General: Alert awake NAD HENT: PERRLA no icterus eomi, dry mm Lungs: CTA no wheezes or rales Cardiovascular: reg s1s2 no murmurs gallops or rubs Abdomen: soft nt nd bs pos no guarding Extremities: no cyanosis, clubbing or edema Neuro: AAO x 3 CN II to XII grossly intact GU: N/A  Resolved problem list   Assessment and Plan  Sepsis From gastroenteritis Acute Gastroenteritis Plan  to rehydrate and support BP Stool for cultures Stool occult pos-monitor h/h Acute Kidney Injury Secondary to hypovolemia and ATN Nephrology consult IV fluids Maintain MAP 65 or better Hypovolemic Shock Continue with IV Norepinephrine  IV fluids Syncope Secondary to low BP state AGMA Secondary to hypotension and AKI Should improve as sepsis, volume loss and improved BP H/o HIV Unlikely to be HIV related since his Viral load is undetectable and his CD4 is over 700 Once po can go back to his home HIV meds H/o Systolic CHF s/p ICD/Pacemaker Get echo, last echo I see is from February He does follow with outpatient heart failure H/o Hep C Has been treated with antiretrovirals Monitor liver functions H/o Neuropathy P.o meds when able to take po  Best Practice (right click and Reselect all SmartList Selections daily)   Diet/type: NPO w/ oral meds DVT prophylaxis prophylactic heparin   Pressure ulcer(s): N/A GI prophylaxis: H2B Lines: N/A Foley:  N/A Code Status:  full code   Labs   CBC: Recent Labs  Lab 01/20/24 1226 01/20/24 1255  WBC 11.3*  --   HGB 15.2 17.0  HCT 45.9 50.0  MCV 93.3  --   PLT 205  --     Basic Metabolic Panel: Recent Labs  Lab 01/20/24 1222 01/20/24 1226 01/20/24 1255  NA  --  135 133*  K  --  4.4 4.2  CL  --  96*  106  CO2  --  14*  --   GLUCOSE  --  110* 103*  BUN  --  46* 46*  CREATININE  --  8.26* 8.90*  CALCIUM   --  9.4  --   MG 2.8*  --   --   PHOS 8.9*  --   --    GFR: Estimated Creatinine Clearance: 8.4 mL/min (A) (by C-G formula based on SCr of 8.9 mg/dL (H)). Recent Labs  Lab 01/20/24 1226 01/20/24 1256  WBC 11.3*  --   LATICACIDVEN  --  1.4    Liver Function Tests: Recent Labs  Lab 01/20/24 1226  AST 40  ALT 26  ALKPHOS 75  BILITOT 0.7  PROT 9.0*  ALBUMIN 4.5   Recent Labs  Lab 01/20/24 1226  LIPASE 53*   No results for input(s): AMMONIA in the last 168 hours.  ABG    Component Value Date/Time    HCO3 21.9 11/30/2011 1702   TCO2 15 (L) 01/20/2024 1255   ACIDBASEDEF 5.0 (H) 11/30/2011 1702   O2SAT 19.0 11/30/2011 1702     Coagulation Profile: Recent Labs  Lab 01/20/24 1255  INR 1.0    Cardiac Enzymes: No results for input(s): CKTOTAL, CKMB, CKMBINDEX, TROPONINI in the last 168 hours.  HbA1C: Hgb A1c MFr Bld  Date/Time Value Ref Range Status  12/13/2021 03:16 AM 5.0 4.8 - 5.6 % Final    Comment:    (NOTE) Pre diabetes:          5.7%-6.4%  Diabetes:              >6.4%  Glycemic control for   <7.0% adults with diabetes   01/26/2017 06:13 AM 4.4 (L) 4.8 - 5.6 % Final    Comment:    (NOTE) Pre diabetes:          5.7%-6.4% Diabetes:              >6.4% Glycemic control for   <7.0% adults with diabetes     CBG: No results for input(s): GLUCAP in the last 168 hours.  Review of Systems:   N/V/D x 4 days, Syncope x 3, decreased po intake  Past Medical History:  He,  has a past medical history of AICD (automatic cardioverter/defibrillator) present, CHF (congestive heart failure) (HCC), Chronic systolic heart failure (HCC), Depression, G6PD deficiency, GERD (gastroesophageal reflux disease), Hepatitis, HIV infection (HCC), Hypertension, NICM (nonischemic cardiomyopathy) (HCC), Pneumonia, Presence of permanent cardiac pacemaker, Stroke (HCC) (12/11/2021), and VT (ventricular tachycardia) (HCC).   Surgical History:   Past Surgical History:  Procedure Laterality Date   BI-VENTRICULAR PACEMAKER INSERTION N/A 06/26/2013   Procedure: BI-VENTRICULAR PACEMAKER INSERTION (CRT-P);  Surgeon: Danelle LELON Birmingham, MD;  Location: [facility] CATH LAB;  Service: Cardiovascular;  Laterality: N/A;   BIV ICD GENERATOR CHANGEOUT N/A 01/24/2021   Procedure: BIV ICD GENERATOR CHANGEOUT;  Surgeon: Birmingham Danelle LELON, MD;  Location: [facility] INVASIVE CV LAB;  Service: Cardiovascular;  Laterality: N/A;   COLONOSCOPY WITH PROPOFOL  N/A 08/14/2016   Procedure: COLONOSCOPY WITH PROPOFOL ;  Surgeon: Belvie Just, MD;  Location: [facility] ENDOSCOPY;  Service: Endoscopy;  Laterality: N/A;   ELECTROPHYSIOLOGY STUDY N/A 02/19/2012   Procedure: ELECTROPHYSIOLOGY STUDY;  Surgeon: Danelle LELON Birmingham, MD;  Location: [facility] CATH LAB;  Service: Cardiovascular;  Laterality: N/A;   ESOPHAGOGASTRODUODENOSCOPY  12/07/2011   Procedure: ESOPHAGOGASTRODUODENOSCOPY (EGD);  Surgeon: Gwendlyn ONEIDA Buddy, MD,FACG;  Location: [facility] ENDOSCOPY;  Service: Endoscopy;  Laterality: N/A;   FINGER SURGERY     Thumb laceration.  HARDWARE REMOVAL Right 07/01/2018   Procedure: HARDWARE REMOVAL;  Surgeon: Beverley Evalene BIRCH, MD;  Location: [facility];  Service: Orthopedics;  Laterality: Right;   INCISION AND DRAINAGE Right 07/01/2018   Procedure: INCISION AND DRAINAGE;  Surgeon: Beverley Evalene BIRCH, MD;  Location:  SURGERY CENTER;  Service: Orthopedics;  Laterality: Right;   LEAD EXTRACTION N/A 11/16/2022   Procedure: LEAD EXTRACTION;  Surgeon: Waddell Danelle ORN, MD;  Location: [facility] INVASIVE CV LAB;  Service: Cardiovascular;  Laterality: N/A;   LEFT HEART CATHETERIZATION WITH CORONARY ANGIOGRAM N/A 01/31/2014   Procedure: LEFT HEART CATHETERIZATION WITH CORONARY ANGIOGRAM;  Surgeon: Deatrice DELENA Cage, MD;  Location: [facility] CATH LAB;  Service: Cardiovascular;  Laterality: N/A;   ORIF ANKLE FRACTURE Right 01/26/2017   Procedure: OPEN REDUCTION INTERNAL FIXATION (ORIF) ANKLE FRACTURE;  Surgeon: Beverley Evalene BIRCH, MD;  Location: [facility] OR;  Service: Orthopedics;  Laterality: Right;   TEE WITHOUT CARDIOVERSION N/A 11/16/2022   Procedure: TRANSESOPHAGEAL ECHOCARDIOGRAM;  Surgeon: Waddell Danelle ORN, MD;  Location: [facility] INVASIVE CV LAB;  Service: Cardiovascular;  Laterality: N/A;     Social History:   reports that he has never smoked. He has never used smokeless tobacco. He reports current alcohol use of about 4.0 standard drinks of alcohol per week. He reports that he does not currently use drugs after having used the following drugs: Marijuana.   Family  History:  His family history includes Colon cancer (age of onset: 69) in an other family member; Lung cancer in his mother.   Allergies Allergies  Allergen Reactions   Bactrim Rash   Sulfamethoxazole -Trimethoprim Rash     Home Medications  Prior to Admission medications   Medication Sig Start Date End Date Taking? Authorizing Provider  acetaminophen  (TYLENOL ) 325 MG tablet Take 2 tablets (650 mg total) by mouth every 4 (four) hours as needed for mild pain (or temp > 37.5 C (99.5 F)). 12/15/21   Macario Dorothyann HERO, MD  allopurinol  (ZYLOPRIM ) 100 MG tablet TAKE 1 TABLET(100 MG) BY MOUTH TWICE DAILY 06/15/23   Zamora, Erin R, NP  amiodarone  (PACERONE ) 200 MG tablet Take 0.5 tablets (100 mg total) by mouth daily. 07/06/23   Bensimhon, Toribio SAUNDERS, MD  carvedilol  (COREG ) 25 MG tablet Take 1 tablet (25 mg total) by mouth 2 (two) times daily with a meal. 07/06/23   Bensimhon, Toribio SAUNDERS, MD  cholecalciferol  (VITAMIN D ) 25 MCG (1000 UNIT) tablet Take 1,000 Units by mouth daily.    [provider]  colchicine  0.6 MG tablet TAKE 1 TABLET(0.6 MG) BY MOUTH TWICE DAILY 06/14/23   Harden Jerona GAILS, MD  diclofenac  Sodium (VOLTAREN  ARTHRITIS PAIN) 1 % GEL Apply 2 g topically as needed.    [provider]  dolutegravir  (TIVICAY ) 50 MG tablet Take 1 tablet (50 mg total) by mouth daily. 11/10/23   Calone, Gregory D, FNP  empagliflozin  (JARDIANCE ) 10 MG TABS tablet Take 1 tablet (10 mg total) by mouth daily. 07/06/23   Bensimhon, Toribio SAUNDERS, MD  emtricitabine -rilpivir-tenofovir  AF (ODEFSEY ) 200-25-25 MG TABS tablet Take 1 tablet by mouth daily. 11/10/23   Calone, Gregory D, FNP  fluticasone  (FLONASE ) 50 MCG/ACT nasal spray SHAKE LIQUID AND USE 2 SPRAYS IN EACH NOSTRIL DAILY 01/17/21   Jennelle Riis, MD  furosemide  (LASIX ) 20 MG tablet Take 1 tablet (20 mg total) by mouth daily. 07/06/23   Bensimhon, Toribio SAUNDERS, MD  gabapentin  (NEURONTIN ) 300 MG capsule Take 300 mg by mouth 3 (three) times daily.    [provider]  Multiple Vitamins-Minerals (CENTRUM SILVER 50+MEN PO) Take 1 tablet by mouth daily.    [provider]  Omega-3 Fatty Acids (FISH OIL) 1000 MG CPDR Take 1,000 mg by mouth daily.    [provider]  ondansetron  (ZOFRAN ) 4 MG tablet Take 4 mg by mouth every 8 (eight) hours as needed for nausea or vomiting. 03/26/17   [provider]  potassium chloride  SA (KLOR-CON  M) 20 MEQ tablet Take 1 tablet (20 mEq total) by mouth daily. 07/06/23   Bensimhon, Toribio SAUNDERS, MD  rosuvastatin  (CRESTOR ) 10 MG tablet Take 1 tablet (10 mg total) by mouth daily. 07/06/23   Bensimhon, Toribio SAUNDERS, MD  sacubitril -valsartan  (ENTRESTO ) 97-103 MG Take 1 tablet by mouth 2 (two) times daily. 07/06/23   Bensimhon, Toribio SAUNDERS, MD  spironolactone  (ALDACTONE ) 25 MG tablet Take 1 tablet (25 mg total) by mouth daily. 07/06/23   Bensimhon, Toribio SAUNDERS, MD  traZODone  (DESYREL ) 50 MG tablet TAKE 1 TABLET(50 MG) BY MOUTH AT BEDTIME 01/19/22   Jennelle Riis, MD  triamcinolone  ointment (KENALOG ) 0.1 % Apply 1 Application topically daily as needed (itching). 01/08/22   [provider]     Critical care time: 18   The patient is critically ill with multiple organ system failure and requires high complexity decision making for assessment and support, frequent evaluation and titration of therapies, advanced monitoring, review of radiographic studies and interpretation of complex data.   Critical Care Time devoted to patient care services, exclusive of separately billable procedures, described in this note is 35 minutes.   Orlin Fairly, MD [facility] See Amion for pager  If no response to pager , please call [phone] until 7pm After 7:00 pm call Elink  [phone] 01/20/2024, 9:05 PM

## 2024-01-20 NOTE — ED Notes (Signed)
 Pt calls this RN to show that he is eating crackers despite attempts at educating patient on importance of NPO status until he is seen by critical care (as ordered by EDP). Pt states F**k that ER doctor. I'm gonna eat, I haven't eaten in over 12 damn hours. Pt asked to keep arm straight, as medications are running through IV. Pt states And it's gonna keep going off because I'm gonna eat.

## 2024-01-20 NOTE — ED Provider Notes (Signed)
 Earlier EDP had left at 1615 indicating at that time he had consulted icu team for admission, discussed pt, and that they were coming to see/admit.   RN reports no admit orders. ICU team re-paged. Discussed pt, currently bp remains low but improved mildly from prior, is on levophed  5 mcg. Discussed pt with icu team - they will see/admit. Recheck pt, no abd pain or tenderness. Is asking for po fluids/food.   Icu team coming to admit.      Bernard Drivers, MD 01/20/24 2025

## 2024-01-20 NOTE — Telephone Encounter (Signed)
 Patient called with a request for an appointment  Reports NVD x 3 days, reports syncope x 2 8/20 and x 1 8/21  Reports b/p 55/44 this am Reports fever Reports severe weakness and fatigue Reports muscle spasms all over body last night Reports he was still taking all meds with n/v/d     Add on appt 8/21 @ 1130 Advised will still forward message to provider for input, would also reach out to PCP in the event above is not 100% cardiac in nature  Please advise

## 2024-01-20 NOTE — ED Notes (Signed)
 Patient transported to CT

## 2024-01-20 NOTE — ED Notes (Signed)
 Patient requesting for diet order. This RN educates patient on importance of NPO status until patient is cleared by MD. Pt verbalizes understanding but states It is ridiculous these people haven't given me anything to eat or drink in 8 hours. Message sent to ED doctor.

## 2024-01-20 NOTE — ED Notes (Signed)
Pt unable to provide UA.

## 2024-01-20 NOTE — ED Triage Notes (Signed)
 Patient c/o syncopal episodes last night after 3 days of emesis and diarrhea. Patient reports home BP of 60s systolic and reports he has pacemaker as well.

## 2024-01-20 NOTE — ED Provider Notes (Signed)
 [place] EMERGENCY DEPARTMENT AT [facility] Provider Note   CSN: [id number] Arrival date & time: 01/20/24  1204     Patient presents with: Hypotension and Loss of Consciousness   [name] is a 58 y.o. male.   58 year old male with prior medical history as detailed below presents for evaluation.  Patient reports 3 to 4 days of profuse watery diarrhea and associated nausea and vomiting.  He reports that he has passed out several times in the last 24 hours.  Patient appears to be very weak on exam.  He denies bloody stool or bloody emesis.  He reports that his last urination was approximately 20 hours ago.  Blood pressure in triage of 60 over 40s.  He denies fever.  Prior medical history includes HIV, HCV, EtOH, NICM, AICD.  The history is provided by the patient and medical records.       Prior to Admission medications   Medication Sig Start Date End Date Taking? Authorizing Provider  acetaminophen  (TYLENOL ) 325 MG tablet Take 2 tablets (650 mg total) by mouth every 4 (four) hours as needed for mild pain (or temp > 37.5 C (99.5 F)). 12/15/21   Macario Dorothyann HERO, MD  allopurinol  (ZYLOPRIM ) 100 MG tablet TAKE 1 TABLET(100 MG) BY MOUTH TWICE DAILY 06/15/23   Zamora, Erin R, NP  amiodarone  (PACERONE ) 200 MG tablet Take 0.5 tablets (100 mg total) by mouth daily. 07/06/23   Bensimhon, Toribio SAUNDERS, MD  carvedilol  (COREG ) 25 MG tablet Take 1 tablet (25 mg total) by mouth 2 (two) times daily with a meal. 07/06/23   Bensimhon, Toribio SAUNDERS, MD  cholecalciferol  (VITAMIN D ) 25 MCG (1000 UNIT) tablet Take 1,000 Units by mouth daily.    [provider]  colchicine  0.6 MG tablet TAKE 1 TABLET(0.6 MG) BY MOUTH TWICE DAILY 06/14/23   Harden Jerona GAILS, MD  diclofenac  Sodium (VOLTAREN  ARTHRITIS PAIN) 1 % GEL Apply 2 g topically as needed.    [provider]  dolutegravir  (TIVICAY ) 50 MG tablet Take 1 tablet (50 mg total) by mouth daily. 11/10/23   Calone, Gregory D, FNP   empagliflozin  (JARDIANCE ) 10 MG TABS tablet Take 1 tablet (10 mg total) by mouth daily. 07/06/23   Bensimhon, Toribio SAUNDERS, MD  emtricitabine -rilpivir-tenofovir  AF (ODEFSEY ) 200-25-25 MG TABS tablet Take 1 tablet by mouth daily. 11/10/23   Calone, Gregory D, FNP  fluticasone  (FLONASE ) 50 MCG/ACT nasal spray SHAKE LIQUID AND USE 2 SPRAYS IN EACH NOSTRIL DAILY 01/17/21   Jennelle Riis, MD  furosemide  (LASIX ) 20 MG tablet Take 1 tablet (20 mg total) by mouth daily. 07/06/23   Bensimhon, Toribio SAUNDERS, MD  gabapentin  (NEURONTIN ) 300 MG capsule Take 300 mg by mouth 3 (three) times daily.    [provider]  Multiple Vitamins-Minerals (CENTRUM SILVER 50+MEN PO) Take 1 tablet by mouth daily.    [provider]  Omega-3 Fatty Acids (FISH OIL) 1000 MG CPDR Take 1,000 mg by mouth daily.    [provider]  ondansetron  (ZOFRAN ) 4 MG tablet Take 4 mg by mouth every 8 (eight) hours as needed for nausea or vomiting. 03/26/17   [provider]  potassium chloride  SA (KLOR-CON  M) 20 MEQ tablet Take 1 tablet (20 mEq total) by mouth daily. 07/06/23   Bensimhon, Toribio SAUNDERS, MD  rosuvastatin  (CRESTOR ) 10 MG tablet Take 1 tablet (10 mg total) by mouth daily. 07/06/23   Bensimhon, Toribio SAUNDERS, MD  sacubitril -valsartan  (ENTRESTO ) 97-103 MG Take 1 tablet by mouth 2 (  two) times daily. 07/06/23   Bensimhon, Toribio SAUNDERS, MD  spironolactone  (ALDACTONE ) 25 MG tablet Take 1 tablet (25 mg total) by mouth daily. 07/06/23   Bensimhon, Toribio SAUNDERS, MD  traZODone  (DESYREL ) 50 MG tablet TAKE 1 TABLET(50 MG) BY MOUTH AT BEDTIME 01/19/22   Jennelle Riis, MD  triamcinolone  ointment (KENALOG ) 0.1 % Apply 1 Application topically daily as needed (itching). 01/08/22   [provider]    Allergies: Bactrim and Sulfamethoxazole -trimethoprim    Review of Systems  Cardiovascular:  Positive for syncope.  All other systems reviewed and are negative.   Updated Vital Signs BP (!) 61/47 (BP Location: Right Arm)   Pulse 70    Temp 97.9 F (36.6 C)   Resp 20   SpO2 100%   Physical Exam Vitals and nursing note reviewed.  Constitutional:      General: He is not in acute distress.    Appearance: Normal appearance. He is well-developed.  HENT:     Head: Normocephalic and atraumatic.     Mouth/Throat:     Mouth: Mucous membranes are dry.  Eyes:     Conjunctiva/sclera: Conjunctivae normal.     Pupils: Pupils are equal, round, and reactive to light.  Cardiovascular:     Rate and Rhythm: Normal rate and regular rhythm.     Heart sounds: Normal heart sounds.  Pulmonary:     Effort: Pulmonary effort is normal. No respiratory distress.     Breath sounds: Normal breath sounds.  Abdominal:     General: There is no distension.     Palpations: Abdomen is soft.     Tenderness: There is no abdominal tenderness.  Musculoskeletal:        General: No deformity. Normal range of motion.     Cervical back: Normal range of motion and neck supple.  Skin:    General: Skin is warm and dry.  Neurological:     General: No focal deficit present.     Mental Status: He is alert and oriented to person, place, and time.     (all labs ordered are listed, but only abnormal results are displayed) Labs Reviewed  CULTURE, BLOOD (ROUTINE X 2)  CULTURE, BLOOD (ROUTINE X 2)  LIPASE, BLOOD  COMPREHENSIVE METABOLIC PANEL WITH GFR  CBC  URINALYSIS, ROUTINE W REFLEX MICROSCOPIC  BRAIN NATRIURETIC PEPTIDE  PROTIME-INR  I-STAT CG4 LACTIC ACID, ED  I-STAT CHEM 8, ED  TYPE AND SCREEN    EKG: None  Radiology: No results found.   Procedures   Medications Ordered in the ED  sodium chloride  0.9 % bolus 1,000 mL (1,000 mLs Intravenous New Bag/Given 01/20/24 1227)                                    Medical Decision Making Patient reports profuse diarrhea and associated nausea vomiting.  Patient is hypotensive and triage.  Initial workup suggest significant AKI.  Out of concern for possible sepsis, broad-spectrum  antibiotics initiated.  IV fluids initiated.  Patient with continued hypotension despite initial resuscitation.  Levophed  started.  Patient's mentation is at baseline.  Patient would benefit from critical care evaluation.  Critical care is aware of case and will evaluate for admission.  Oncoming EDP aware of case.  Amount and/or Complexity of Data Reviewed Labs: ordered. Radiology: ordered.  Risk Prescription drug management. Decision regarding hospitalization.   CRITICAL CARE Performed by: Maude JAYSON Galloway   Total  critical care time: 45 minutes  Critical care time was exclusive of separately billable procedures and treating other patients.  Critical care was necessary to treat or prevent imminent or life-threatening deterioration.  Critical care was time spent personally by me on the following activities: development of treatment plan with patient and/or surrogate as well as nursing, discussions with consultants, evaluation of patient's response to treatment, examination of patient, obtaining history from patient or surrogate, ordering and performing treatments and interventions, ordering and review of laboratory studies, ordering and review of radiographic studies, pulse oximetry and re-evaluation of patient's condition.      Final diagnoses:  Syncope and collapse  Hypotension, unspecified hypotension type  Diarrhea, unspecified type    ED Discharge Orders     None          Laurice Maude BROCKS, MD 01/23/24 1750

## 2024-01-20 NOTE — ED Notes (Signed)
 Delay in ICU transport. Med tech speaking with patient prior to ICU transport.

## 2024-01-20 NOTE — ED Notes (Signed)
 ED Provider at bedside.

## 2024-01-20 NOTE — ED Notes (Signed)
 Pt IV infiltration. Pt notified that he will need another IV, as he only has one working IV with continuous levophed . Pt verbally upset about the need for another IV start attempt. This RN educated patient on purpose/need for secondary IV due to patient current condition.

## 2024-01-20 NOTE — Telephone Encounter (Signed)
 Pt aware and voiced understanding  Reports he will go ER   Appt cancelled

## 2024-01-20 NOTE — Telephone Encounter (Signed)
 He should not come to the office for an appointmnent. He needs to call EMS with hypotension, syncope and fever.

## 2024-01-21 ENCOUNTER — Inpatient Hospital Stay (HOSPITAL_COMMUNITY)

## 2024-01-21 DIAGNOSIS — N179 Acute kidney failure, unspecified: Secondary | ICD-10-CM | POA: Diagnosis not present

## 2024-01-21 DIAGNOSIS — E8721 Acute metabolic acidosis: Secondary | ICD-10-CM

## 2024-01-21 DIAGNOSIS — R578 Other shock: Secondary | ICD-10-CM

## 2024-01-21 DIAGNOSIS — A419 Sepsis, unspecified organism: Secondary | ICD-10-CM | POA: Diagnosis not present

## 2024-01-21 DIAGNOSIS — R571 Hypovolemic shock: Secondary | ICD-10-CM | POA: Diagnosis not present

## 2024-01-21 LAB — GLUCOSE, CAPILLARY
Glucose-Capillary: 115 mg/dL — ABNORMAL HIGH (ref 70–99)
Glucose-Capillary: 119 mg/dL — ABNORMAL HIGH (ref 70–99)
Glucose-Capillary: 102 mg/dL — ABNORMAL HIGH (ref 70–99)
Glucose-Capillary: 93 mg/dL (ref 70–99)
Glucose-Capillary: 87 mg/dL (ref 70–99)
Glucose-Capillary: 95 mg/dL (ref 70–99)

## 2024-01-21 LAB — BASIC METABOLIC PANEL WITH GFR
Anion gap: 11 (ref 5–15)
BUN: 30 mg/dL — ABNORMAL HIGH (ref 6–20)
CO2: 13 mmol/L — ABNORMAL LOW (ref 22–32)
Calcium: 7.9 mg/dL — ABNORMAL LOW (ref 8.9–10.3)
Chloride: 113 mmol/L — ABNORMAL HIGH (ref 98–111)
Creatinine, Ser: 3.64 mg/dL — ABNORMAL HIGH (ref 0.61–1.24)
GFR, Estimated: 18 mL/min — ABNORMAL LOW (ref >60)
Glucose, Bld: 116 mg/dL — ABNORMAL HIGH (ref 70–99)
Potassium: 3.2 mmol/L — ABNORMAL LOW (ref 3.5–5.1)
Sodium: 137 mmol/L (ref 135–145)

## 2024-01-21 LAB — ECHOCARDIOGRAM COMPLETE
AR max vel: 2.88 cm2
AV Area VTI: 2.82 cm2
AV Area mean vel: 2.84 cm2
AV Mean grad: 3 mmHg
AV Peak grad: 5.6 mmHg
Ao pk vel: 1.18 m/s
Area-P 1/2: 5.27 cm2
Calc EF: 42.9 %
Height: 64 in
MV M vel: 1.95 m/s
MV Peak grad: 15.2 mmHg
S' Lateral: 3.2 cm
Single Plane A2C EF: 37.8 %
Single Plane A4C EF: 45.4 %
Weight: 2821.89 [oz_av]

## 2024-01-21 LAB — CBC
HCT: 38 % — ABNORMAL LOW (ref 39.0–52.0)
Hemoglobin: 12.8 g/dL — ABNORMAL LOW (ref 13.0–17.0)
MCH: 30.9 pg (ref 26.0–34.0)
MCHC: 33.7 g/dL (ref 30.0–36.0)
MCV: 91.8 fL (ref 80.0–100.0)
Platelets: 158 K/uL (ref 150–400)
RBC: 4.14 MIL/uL — ABNORMAL LOW (ref 4.22–5.81)
RDW: 12.6 % (ref 11.5–15.5)
WBC: 10.8 K/uL — ABNORMAL HIGH (ref 4.0–10.5)
nRBC: 0 % (ref 0.0–0.2)

## 2024-01-21 LAB — MAGNESIUM: Magnesium: 2.1 mg/dL (ref 1.7–2.4)

## 2024-01-21 LAB — PHOSPHORUS: Phosphorus: 3.2 mg/dL (ref 2.5–4.6)

## 2024-01-21 LAB — MRSA NEXT GEN BY PCR, NASAL: MRSA by PCR Next Gen: NOT DETECTED

## 2024-01-21 MED ORDER — ROSUVASTATIN CALCIUM 5 MG PO TABS
10.0000 mg | ORAL_TABLET | Freq: Every day | ORAL | Status: DC
  Administered 2024-01-21 – 2024-02-01 (×12): 10 mg via ORAL
  Filled 2024-01-21 (×12): qty 2

## 2024-01-21 MED ORDER — AMIODARONE HCL 200 MG PO TABS
100.0000 mg | ORAL_TABLET | Freq: Every day | ORAL | Status: DC
  Administered 2024-01-21 – 2024-02-01 (×12): 100 mg via ORAL
  Filled 2024-01-21 (×12): qty 1

## 2024-01-21 MED ORDER — ALLOPURINOL 100 MG PO TABS
50.0000 mg | ORAL_TABLET | ORAL | Status: DC
  Administered 2024-01-21 – 2024-01-23 (×2): 50 mg via ORAL
  Filled 2024-01-21: qty 1
  Filled 2024-01-21: qty 0.5

## 2024-01-21 MED ORDER — TRAZODONE HCL 50 MG PO TABS
50.0000 mg | ORAL_TABLET | Freq: Every day | ORAL | Status: DC
  Administered 2024-01-21 – 2024-01-31 (×11): 50 mg via ORAL
  Filled 2024-01-21 (×11): qty 1

## 2024-01-21 MED ORDER — POTASSIUM CHLORIDE 10 MEQ/100ML IV SOLN
10.0000 meq | Freq: Once | INTRAVENOUS | Status: AC
  Administered 2024-01-21: 10 meq via INTRAVENOUS
  Filled 2024-01-21: qty 100

## 2024-01-21 MED ORDER — GABAPENTIN 300 MG PO CAPS
300.0000 mg | ORAL_CAPSULE | Freq: Two times a day (BID) | ORAL | Status: DC
  Administered 2024-01-21 – 2024-01-24 (×7): 300 mg via ORAL
  Filled 2024-01-21 (×7): qty 1

## 2024-01-21 MED ORDER — CARVEDILOL 25 MG PO TABS: 25.0000 mg | ORAL_TABLET | Freq: Two times a day (BID) | ORAL | Status: DC

## 2024-01-21 MED ORDER — POTASSIUM CHLORIDE 10 MEQ/100ML IV SOLN
10.0000 meq | INTRAVENOUS | Status: AC
  Administered 2024-01-21 (×3): 10 meq via INTRAVENOUS
  Filled 2024-01-21 (×3): qty 100

## 2024-01-21 MED ORDER — GABAPENTIN 300 MG PO CAPS: 300.0000 mg | ORAL_CAPSULE | Freq: Three times a day (TID) | ORAL | Status: DC

## 2024-01-21 MED ORDER — LACTATED RINGERS IV SOLN: INTRAVENOUS | Status: AC

## 2024-01-21 MED ORDER — DOLUTEGRAVIR SODIUM 50 MG PO TABS
50.0000 mg | ORAL_TABLET | Freq: Every day | ORAL | Status: DC
  Administered 2024-01-21 – 2024-02-01 (×12): 50 mg via ORAL
  Filled 2024-01-21 (×12): qty 1

## 2024-01-21 MED ORDER — COLCHICINE 0.6 MG PO TABS
0.6000 mg | ORAL_TABLET | Freq: Every day | ORAL | Status: DC
  Administered 2024-01-21 – 2024-01-28 (×8): 0.6 mg via ORAL
  Filled 2024-01-21 (×8): qty 1

## 2024-01-21 MED ORDER — EMTRICITAB-RILPIVIR-TENOFOV AF 200-25-25 MG PO TABS
1.0000 | ORAL_TABLET | Freq: Every day | ORAL | Status: DC
  Administered 2024-01-21 – 2024-02-01 (×12): 1 via ORAL
  Filled 2024-01-21 (×12): qty 1

## 2024-01-21 NOTE — Plan of Care (Signed)

## 2024-01-21 NOTE — TOC CM/SW Note (Signed)
 Transition of Care [facility]) - Inpatient Brief Assessment   Patient Details  Name: WARNER LADUCA MRN: [id number] Date of Birth: 1965/07/04  [facility]) CM/SW Contact:    Lauraine FORBES Saa, LCSWA Phone Number: 01/21/2024, 10:04 AM   Clinical Narrative:  10:04 AM Per chart review, patient resides at home with significant other. Patient has a PCP and insurance. Patient has SNF history with [facility]. Patient has HH history with [facility]. Patient has DME (rolling walker) history. Patient's preferred pharmacy is [facility] [phone] [facility]. No TOC needs were identified at this time. TOC will continue to follow and be available to assist.  Transition of Care Asessment: Insurance and Status: Insurance coverage has been reviewed Patient has primary care physician: Yes Home environment has been reviewed: Private Residence Prior level of function:: N/A Prior/Current Home Services: No current home services Social Drivers of Health Review: SDOH reviewed no interventions necessary Readmission risk has been reviewed: Yes (Currently Yellow 17%) Transition of care needs: no transition of care needs at this time

## 2024-01-21 NOTE — Progress Notes (Signed)
 NAME:  [name], MRN:  [id number], DOB:  June 20, 1965, LOS: 1 ADMISSION DATE:  01/20/2024, CONSULTATION DATE:  01/20/2024 REFERRING MD:  Maude Galloway, MD, CHIEF COMPLAINT:  N/V/D x 4 days   History of Present Illness:  58 y/o male with PMH for HIV+ with no HIV related illness and CD4 719 and undetectable viral load, Treated Hep C, neuropathy, Bivetricular pacemaker/defibrillator, CHF EF 45-50% , GERD, HTN who presented with N/V/D for the last 4 days and fever to 100 at home.  He had 3 syncopal events and was to have cardiology f/u today outpatient and was advised to go to the ED for low BP.  He has made little urine since being in the ED and his labs revealed AKI with Cr 8.90, His BP has remained low throughout the day in the ED and he was started on peripheral Levophed  now at 5 mcg. Stool occult positive and negative for C diff.  Pertinent  Medical History  HIV+ with no HIV related illness and CD4 719 and undetectable viral load, Treated Hep C, neuropathy, Bivetricular pacemaker/defibrillator, CHF EF 45-50% , GERD, HTN   Significant Hospital Events: Including procedures, antibiotic start and stop dates in addition to other pertinent events   8/21: Admit to ICU  Interim History / Subjective:  Critically ill, remains on Levophed  higher dose Complains of copious watery stools No abdominal pain or bleeding Paced rhythm on monitor  Objective    Blood pressure 98/84, pulse 81, temperature 98 F (36.7 C), temperature source Oral, resp. rate 16, height 5' 4 (1.626 m), weight 80 kg, SpO2 98%.        Intake/Output Summary (Last 24 hours) at 01/21/2024 1258 Last data filed at 01/21/2024 0900 Gross per 24 hour  Intake 4252.4 ml  Output 650 ml  Net 3602.4 ml   Filed Weights   01/20/24 1400 01/21/24 0422  Weight: 80 kg 80 kg    Examination: General: Alert awake NAD HENT: PERRLA no icterus eomi, dry mm Lungs: Clear to auscultation, no accessory muscle use Cardiovascular: Paced  rhythm on monitor, S1-S2 regular Abdomen: soft nt nd bs pos no guarding Extremities: no cyanosis, clubbing or edema Neuro: AAO x 3 CN II to XII grossly intact GU: N/A  Labs show hypokalemia, BUN/creatinine 30/3.6, decreased leukocytosis FOBT positive  Resolved problem list   Assessment and Plan  Septic versus hypovolemic shock, more likely From gastroenteritis      -Continue IV fluids and Levophed  as needed Acute Gastroenteritis Plan to rehydrate and support BP Stool for cultures and PCR Stool occult pos-monitor h/h Acute Kidney Injury , improving Secondary to hypovolemia and ATN IV fluids  Syncope, Secondary to low BP state   H/o Systolic CHF s/p ICD/Pacemaker Await echo, he has outpatient follow-up with heart failure service N[facility] Secondary to diarrhea Avoid hyperchloremic fluids H/o HIV Unlikely to be HIV related since his Viral load is undetectable and his CD4 is over 700 Once po can go back to his home HIV meds H/o Hep C Has been treated with antiretrovirals Monitor liver functions H/o Neuropathy P.o meds when able to take po  Best Practice (right click and Reselect all SmartList Selections daily)   Advance diet DVT prophylaxis prophylactic heparin   Pressure ulcer(s): N/A GI prophylaxis: H2B Lines: N/A Foley:  N/A Code Status:  full code   Labs   CBC: Recent Labs  Lab 01/20/24 1226 01/20/24 1255 01/20/24 2245 01/21/24 0555  WBC 11.3*  --  14.1* 10.8*  HGB 15.2 17.0  13.0 12.8*  HCT 45.9 50.0 39.4 38.0*  MCV 93.3  --  93.4 91.8  PLT 205  --  169 158    Basic Metabolic Panel: Recent Labs  Lab 01/20/24 1222 01/20/24 1226 01/20/24 1255 01/20/24 2245 01/21/24 0555  NA  --  135 133*  --  137  K  --  4.4 4.2  --  3.2*  CL  --  96* 106  --  113*  CO2  --  14*  --   --  13*  GLUCOSE  --  110* 103*  --  116*  BUN  --  46* 46*  --  30*  CREATININE  --  8.26* 8.90* 6.28* 3.64*  CALCIUM   --  9.4  --   --  7.9*  MG 2.8*  --   --   --  2.1  PHOS  8.9*  --   --   --  3.2   GFR: Estimated Creatinine Clearance: 20.6 mL/min (A) (by C-G formula based on SCr of 3.64 mg/dL (H)). Recent Labs  Lab 01/20/24 1226 01/20/24 1256 01/20/24 2245 01/21/24 0555  WBC 11.3*  --  14.1* 10.8*  LATICACIDVEN  --  1.4  --   --     Liver Function Tests: Recent Labs  Lab 01/20/24 1226  AST 40  ALT 26  ALKPHOS 75  BILITOT 0.7  PROT 9.0*  ALBUMIN 4.5   Recent Labs  Lab 01/20/24 1226  LIPASE 53*   No results for input(s): AMMONIA in the last 168 hours.  ABG    Component Value Date/Time   HCO3 21.9 11/30/2011 1702   TCO2 15 (L) 01/20/2024 1255   ACIDBASEDEF 5.0 (H) 11/30/2011 1702   O2SAT 19.0 11/30/2011 1702     Coagulation Profile: Recent Labs  Lab 01/20/24 1255  INR 1.0    Cardiac Enzymes: No results for input(s): CKTOTAL, CKMB, CKMBINDEX, TROPONINI in the last 168 hours.  HbA1C: Hgb A1c MFr Bld  Date/Time Value Ref Range Status  01/20/2024 10:45 PM 5.5 4.8 - 5.6 % Final    Comment:    (NOTE) Diagnosis of Diabetes The following HbA1c ranges recommended by the American Diabetes Association (ADA) may be used as an aid in the diagnosis of diabetes mellitus.  Hemoglobin             Suggested A1C NGSP%              Diagnosis  <5.7                   Non Diabetic  5.7-6.4                Pre-Diabetic  >6.4                   Diabetic  <7.0                   Glycemic control for                       adults with diabetes.    12/13/2021 03:16 AM 5.0 4.8 - 5.6 % Final    Comment:    (NOTE) Pre diabetes:          5.7%-6.4%  Diabetes:              >6.4%  Glycemic control for   <7.0% adults with diabetes     CBG: Recent Labs  Lab 01/20/24 2222 01/21/24 0333 01/21/24  9250 01/21/24 1106  GLUCAP 92 115* 119* 102*    Review of Systems:   N/V/D x 4 days, Syncope x 3, decreased po intake  Critical care time: 53   The patient is critically ill with multiple organ system failure and requires high  complexity decision making for assessment and support, frequent evaluation and titration of therapies, advanced monitoring, review of radiographic studies and interpretation of complex data.   Critical Care Time devoted to patient care services, exclusive of separately billable procedures, described in this note is 32 minutes.   Harden ROCKFORD Jude MD [facility] See Amion for pager  If no response to pager , please call [phone] until 7pm After 7:00 pm call Elink  [phone] 01/21/2024, 12:58 PM

## 2024-01-21 NOTE — Progress Notes (Signed)
*  PRELIMINARY RESULTS* Echocardiogram 2D Echocardiogram has been performed.  [name] 01/21/2024, 9:10 AM

## 2024-01-22 ENCOUNTER — Other Ambulatory Visit (HOSPITAL_COMMUNITY): Payer: Self-pay

## 2024-01-22 DIAGNOSIS — A072 Cryptosporidiosis: Secondary | ICD-10-CM | POA: Diagnosis present

## 2024-01-22 DIAGNOSIS — R571 Hypovolemic shock: Secondary | ICD-10-CM | POA: Diagnosis not present

## 2024-01-22 DIAGNOSIS — A419 Sepsis, unspecified organism: Secondary | ICD-10-CM | POA: Diagnosis not present

## 2024-01-22 DIAGNOSIS — R197 Diarrhea, unspecified: Secondary | ICD-10-CM

## 2024-01-22 DIAGNOSIS — B20 Human immunodeficiency virus [HIV] disease: Secondary | ICD-10-CM

## 2024-01-22 DIAGNOSIS — N179 Acute kidney failure, unspecified: Secondary | ICD-10-CM | POA: Diagnosis not present

## 2024-01-22 DIAGNOSIS — E8721 Acute metabolic acidosis: Secondary | ICD-10-CM | POA: Diagnosis not present

## 2024-01-22 DIAGNOSIS — I959 Hypotension, unspecified: Secondary | ICD-10-CM

## 2024-01-22 DIAGNOSIS — D72810 Lymphocytopenia: Secondary | ICD-10-CM

## 2024-01-22 LAB — GASTROINTESTINAL PANEL BY PCR, STOOL (REPLACES STOOL CULTURE)

## 2024-01-22 LAB — CBC WITH DIFFERENTIAL/PLATELET
Abs Immature Granulocytes: 0.03 K/uL (ref 0.00–0.07)
Basophils Absolute: 0 K/uL (ref 0.0–0.1)
Basophils Relative: 0 %
Eosinophils Absolute: 0.1 K/uL (ref 0.0–0.5)
Eosinophils Relative: 1 %
HCT: 32.9 % — ABNORMAL LOW (ref 39.0–52.0)
Hemoglobin: 11.2 g/dL — ABNORMAL LOW (ref 13.0–17.0)
Immature Granulocytes: 0 %
Lymphocytes Relative: 25 %
Lymphs Abs: 2.2 K/uL (ref 0.7–4.0)
MCH: 30.8 pg (ref 26.0–34.0)
MCHC: 34 g/dL (ref 30.0–36.0)
MCV: 90.4 fL (ref 80.0–100.0)
Monocytes Absolute: 1 K/uL (ref 0.1–1.0)
Monocytes Relative: 12 %
Neutro Abs: 5.4 K/uL (ref 1.7–7.7)
Neutrophils Relative %: 62 %
Platelets: 137 K/uL — ABNORMAL LOW (ref 150–400)
RBC: 3.64 MIL/uL — ABNORMAL LOW (ref 4.22–5.81)
RDW: 12.7 % (ref 11.5–15.5)
WBC: 8.8 K/uL (ref 4.0–10.5)
nRBC: 0 % (ref 0.0–0.2)

## 2024-01-22 LAB — BASIC METABOLIC PANEL WITH GFR
Anion gap: 5 (ref 5–15)
BUN: 13 mg/dL (ref 6–20)
CO2: 17 mmol/L — ABNORMAL LOW (ref 22–32)
Calcium: 8.1 mg/dL — ABNORMAL LOW (ref 8.9–10.3)
Chloride: 116 mmol/L — ABNORMAL HIGH (ref 98–111)
Creatinine, Ser: 1.42 mg/dL — ABNORMAL HIGH (ref 0.61–1.24)
GFR, Estimated: 57 mL/min — ABNORMAL LOW (ref >60)
Glucose, Bld: 89 mg/dL (ref 70–99)
Potassium: 3.5 mmol/L (ref 3.5–5.1)
Sodium: 138 mmol/L (ref 135–145)

## 2024-01-22 LAB — PHOSPHORUS: Phosphorus: 1.6 mg/dL — ABNORMAL LOW (ref 2.5–4.6)

## 2024-01-22 LAB — GLUCOSE, CAPILLARY: Glucose-Capillary: 87 mg/dL (ref 70–99)

## 2024-01-22 LAB — MAGNESIUM: Magnesium: 1.7 mg/dL (ref 1.7–2.4)

## 2024-01-22 MED ORDER — POTASSIUM PHOSPHATES 15 MMOLE/5ML IV SOLN
45.0000 mmol | Freq: Once | INTRAVENOUS | Status: AC
  Administered 2024-01-22: 45 mmol via INTRAVENOUS
  Filled 2024-01-22 (×2): qty 15

## 2024-01-22 MED ORDER — MAGNESIUM SULFATE 2 GM/50ML IV SOLN
INTRAVENOUS | Status: AC
  Administered 2024-01-22: 2 g via INTRAVENOUS
  Filled 2024-01-22: qty 50

## 2024-01-22 MED ORDER — MAGNESIUM SULFATE 2 GM/50ML IV SOLN
2.0000 g | Freq: Once | INTRAVENOUS | Status: AC
  Filled 2024-01-22: qty 50

## 2024-01-22 MED ORDER — ACETAMINOPHEN 325 MG PO TABS
650.0000 mg | ORAL_TABLET | ORAL | Status: DC | PRN
  Administered 2024-01-24 – 2024-01-29 (×8): 650 mg via ORAL
  Filled 2024-01-22 (×8): qty 2

## 2024-01-22 MED ORDER — OPIUM 10 MG/ML (1%) PO TINC
6.0000 mg | Freq: Four times a day (QID) | ORAL | Status: DC
  Administered 2024-01-22 – 2024-01-27 (×18): 6 mg via ORAL
  Filled 2024-01-22 (×18): qty 1

## 2024-01-22 MED ORDER — HYDROMORPHONE HCL 1 MG/ML IJ SOLN
0.5000 mg | INTRAMUSCULAR | Status: AC | PRN
  Administered 2024-01-22 – 2024-01-24 (×2): 0.5 mg via INTRAVENOUS
  Filled 2024-01-22 (×2): qty 1

## 2024-01-22 NOTE — Progress Notes (Signed)
 [facility] ADULT ICU REPLACEMENT PROTOCOL   The patient does apply for the [facility] Adult ICU Electrolyte Replacment Protocol based on the criteria listed below:   1.Exclusion criteria: TCTS, ECMO, Dialysis, and Myasthenia Gravis patients 2. Is GFR >/= 30 ml/min? Yes.    Patient's GFR today is 57 3. Is SCr </= 2? Yes.   Patient's SCr is 1.42 mg/dL 4. Did SCr increase >/= 0.5 in 24 hours? No. 5.Pt's weight >40kg  Yes.   6. Abnormal electrolyte(s): Phosphorus, Potassium, Magnesium   7. Electrolytes replaced per protocol 8.  Call MD STAT for K+ </= 2.5, Phos </= 1, or Mag </= 1 Physician:  Dr. Epimenio Medico A Artie Takayama 01/22/2024 5:38 AM

## 2024-01-22 NOTE — Progress Notes (Addendum)
 NAME:  [name], MRN:  [id number], DOB:  [birth year], LOS: 2 ADMISSION DATE:  01/20/2024, CONSULTATION DATE:  01/20/2024 REFERRING MD:  Maude Galloway, MD, CHIEF COMPLAINT:  N/V/D x 4 days   History of Present Illness:  58 y/o male with PMH for HIV+ with no HIV related illness and CD4 719 and undetectable viral load, Treated Hep C, neuropathy, Bivetricular pacemaker/defibrillator, CHF EF 45-50% , GERD, HTN who presented with N/V/D for the last 4 days and fever to 100 at home.  He had 3 syncopal events and was to have cardiology f/u today outpatient and was advised to go to the ED for low BP.  He has made little urine since being in the ED and his labs revealed AKI with Cr 8.90, His BP has remained low throughout the day in the ED and he was started on peripheral Levophed  now at 5 mcg. Stool occult positive and negative for C diff.  Pertinent  Medical History  HIV+ with no HIV related illness and CD4 719 and undetectable viral load, Treated Hep C, neuropathy, Bivetricular pacemaker/defibrillator, CHF EF 45-50% , GERD, HTN   Significant Hospital Events: Including procedures, antibiotic start and stop dates in addition to other pertinent events   8/21: Admit to ICU  Interim History / Subjective:   Much improved, off Levophed  Urine output improving Watery stools continue, complains of abdominal cramping  Objective    Blood pressure 139/71, pulse 72, temperature 98 F (36.7 C), temperature source Oral, resp. rate 17, height 5' 4 (1.626 m), weight 78.6 kg, SpO2 96%.        Intake/Output Summary (Last 24 hours) at 01/22/2024 1001 Last data filed at 01/22/2024 0900 Gross per 24 hour  Intake 3299.3 ml  Output 350 ml  Net 2949.3 ml   Filed Weights   01/20/24 1400 01/21/24 0422 01/22/24 0307  Weight: 80 kg 80 kg 78.6 kg    Examination: General: Alert awake NAD HENT: PERRLA no icterus eomi, dry mm Lungs: Clear to auscultation, no accessory muscle use Cardiovascular: Paced rhythm  on monitor, S1-S2 regular Abdomen: Soft, nontender, no guarding Extremities: no cyanosis, clubbing or edema Neuro: Alert, interactive, nonfocal GU: N/A  Labs show mild hypokalemia, BUN/creatinine decreased to 13/1.4, hypophosphatemia, no leukocytosis FOBT positive  Resolved problem list   Assessment and Plan  Favor hypovolemic shock, From gastroenteritis      -Continue IV fluids , lowered to 100 cc/h Acute Gastroenteritis , stool PCR positive for Cryptosporidium ID consult since diarrhea has been ongoing for 1 week even though he is immunocompetent with good CD4 count, may need treatment.  Considering nitazoxanide  versus paromomycin Stool occult pos-monitor h/h Tincture of opium  for symptomatic relief Acute Kidney Injury , improving Secondary to hypovolemia and ATN IV fluids  Syncope, Secondary to low BP state   H/o Systolic CHF s/p ICD/Pacemaker Echo shows mildly decreased LV function 45 to 50% with global hypokinesis, he has outpatient follow-up with heart failure service [facility] Secondary to diarrhea, improving Avoid hyperchloremic fluids Hypokalemia and hypophosphatemia will be repleted H/o HIV Unlikely to be HIV related since his Viral load is undetectable and his CD4 is over 700 Resume home HIV meds H/o Hep C Has been treated with antiretrovirals Monitor liver functions H/o Neuropathy P.o meds when able to take po  Best Practice (right click and Reselect all SmartList Selections daily)   Advance diet DVT prophylaxis prophylactic heparin   Pressure ulcer(s): N/A GI prophylaxis: H2B Lines: N/A Foley:  N/A Code Status:  full code  Transfer to telemetry under [facility] 8/24  Labs   CBC: Recent Labs  Lab 01/20/24 1226 01/20/24 1255 01/20/24 2245 01/21/24 0555 01/22/24 0226  WBC 11.3*  --  14.1* 10.8* 8.8  NEUTROABS  --   --   --   --  5.4  HGB 15.2 17.0 13.0 12.8* 11.2*  HCT 45.9 50.0 39.4 38.0* 32.9*  MCV 93.3  --  93.4 91.8 90.4  PLT 205  --  169 158 137*     Basic Metabolic Panel: Recent Labs  Lab 01/20/24 1222 01/20/24 1226 01/20/24 1255 01/20/24 2245 01/21/24 0555 01/22/24 0226  NA  --  135 133*  --  137 138  K  --  4.4 4.2  --  3.2* 3.5  CL  --  96* 106  --  113* 116*  CO2  --  14*  --   --  13* 17*  GLUCOSE  --  110* 103*  --  116* 89  BUN  --  46* 46*  --  30* 13  CREATININE  --  8.26* 8.90* 6.28* 3.64* 1.42*  CALCIUM   --  9.4  --   --  7.9* 8.1*  MG 2.8*  --   --   --  2.1 1.7  PHOS 8.9*  --   --   --  3.2 1.6*   GFR: Estimated Creatinine Clearance: 52.4 mL/min (A) (by C-G formula based on SCr of 1.42 mg/dL (H)). Recent Labs  Lab 01/20/24 1226 01/20/24 1256 01/20/24 2245 01/21/24 0555 01/22/24 0226  WBC 11.3*  --  14.1* 10.8* 8.8  LATICACIDVEN  --  1.4  --   --   --     Liver Function Tests: Recent Labs  Lab 01/20/24 1226  AST 40  ALT 26  ALKPHOS 75  BILITOT 0.7  PROT 9.0*  ALBUMIN 4.5   Recent Labs  Lab 01/20/24 1226  LIPASE 53*   No results for input(s): AMMONIA in the last 168 hours.  ABG    Component Value Date/Time   HCO3 21.9 11/30/2011 1702   TCO2 15 (L) 01/20/2024 1255   ACIDBASEDEF 5.0 (H) 11/30/2011 1702   O2SAT 19.0 11/30/2011 1702     Coagulation Profile: Recent Labs  Lab 01/20/24 1255  INR 1.0    Cardiac Enzymes: No results for input(s): CKTOTAL, CKMB, CKMBINDEX, TROPONINI in the last 168 hours.  HbA1C: Hgb A1c MFr Bld  Date/Time Value Ref Range Status  01/20/2024 10:45 PM 5.5 4.8 - 5.6 % Final    Comment:    (NOTE) Diagnosis of Diabetes The following HbA1c ranges recommended by the American Diabetes Association (ADA) may be used as an aid in the diagnosis of diabetes mellitus.  Hemoglobin             Suggested A1C NGSP%              Diagnosis  <5.7                   Non Diabetic  5.7-6.4                Pre-Diabetic  >6.4                   Diabetic  <7.0                   Glycemic control for  adults with diabetes.     12/13/2021 03:16 AM 5.0 4.8 - 5.6 % Final    Comment:    (NOTE) Pre diabetes:          5.7%-6.4%  Diabetes:              >6.4%  Glycemic control for   <7.0% adults with diabetes     CBG: Recent Labs  Lab 01/21/24 1106 01/21/24 1602 01/21/24 1941 01/21/24 2319 01/22/24 0339  GLUCAP 102* 93 87 95 87    Ehtan Delfavero V. Jude MD [facility] See Amion for pager  If no response to pager , please call [phone] until 7pm After 7:00 pm call Elink  [phone] 01/22/2024, 10:01 AM

## 2024-01-22 NOTE — Consult Note (Signed)
 [facility]                                                                                        Patient Identification: Patient Name: [name] MRN: [id number] Admit Date: 01/20/2024 12:14 PM Today's Date: 01/22/2024 Reason for consult: HIV, Cryptosporidium diarrhea Requesting provider: Dr. 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 complicated with   # Symptomatic Hypotension/Hypovolemic shock  - On IVF and electrolytes repleted  - off vasopressors   # AKI - in the setting of hypotension  - improving   # Leukopenia/Thrombocytopenia - in the setting of above  - monitor   # HIV  Lab Results  Component Value Date   HIV1RNAQUANT NOT DETECTED 11/10/2023   Lab Results  Component Value Date   CD4TABS 590 11/05/2022   CD4TABS 826 05/19/2022   CD4TABS 492 11/04/2021   # 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 His HIV is well controlled and will continue Odefsey  and Tivicay  as is   - Antimicrobial therapy is adjunctive with variable efficacy HIV with poor immunity. Given his CD4 normal range, he is not immunocompromised and hence, can consider anti microbial tx. Nitazoxanide  considered, however, not in stock in pharmacy, likely can get on Monday.  - Considered other alternative treatments including Paromomycin ( not in stock) but no evidence of significant benefit and will not pursue that route with hope can get Nitazoxanide  in 1-2 days.  - Fluid and electrolyte management per primary  team - Can use loperamide  as needed  - Maintain enteric precautions D/w PCCM and pharmacy New ID team to follow starting Monday. Reach out with questions or concerns over the weekend.   Rest of the management as per the primary team. Please call with questions or concerns.  Thank you for the consult  __________________________________________________________________________________________________________ HPI and Hospital Course: 58 year old male with history of HIV, HCV s/p treatment, neuropathy, NICM, CHFs/p AICD, GERD, HTN, CVA, alcohol use who presented to the ED on 8/21 with profuse watery diarrhea  associated with nausea and vomiting + fevers.  He reported passing out 3 times prior to admit.   Reports sudden onset watery diarrhea since Monday with no recent outside food/water intake, travel, sick contact or any known triggers. Has a dog at home. Reports being compliant with ART and well controlled for 20 years. His bottom hurts due to consistent diarrhea. Some abdominal cramps.  Also had nausea, vomiting. No blood in stool or vomitus. Denies any abdominal pain.   At ED afebrile but hypotensive requiring IVF as well as vasopressors Labs remarkable for AKI with creatinine elevated to 8.26, phosphorus 8.9, magnesium  2.8, lipase 53, WBC 11.3 UA cloudy moderate hemoglobin, negative leukocytes, negative nitrite rare bacteria, RBC 0-5, WBC 6-10.  Unremarkable for UTI Stool occult  positive  8/21 blood cx 2/2 NGTD 8/21 MRSA PCR negative  8/21 C diff negative, GIP + for cryptosporidium   CT abdomen/pelvis IMPRESSION: 1. No acute inflammatory process identified within the abdomen or pelvis. 2. Multiple other nonacute observations, as described above.   Aortic Atherosclerosis (ICD10-I70.0).  ROS: General- Denies fever, chills, loss of appetite and loss of weight HEENT - Denies headache, blurry vision, neck pain, sinus pain Chest - Denies any chest pain, SOB or cough CVS- Denies any  chest pain Abdomen- ongoing diarrhea, abdominal cramps Neuro - Denies any weakness, numbness, tingling sensation Psych - Denies any changes in mood irritability or depressive symptoms GU- Denies any burning, dysuria, hematuria or increased frequency of urination Skin - denies any rashes/lesions MSK - denies any joint pain/swelling or restricted ROM   Past Medical History:  Diagnosis Date   AICD (automatic cardioverter/defibrillator) present    CHF (congestive heart failure) (HCC)    Chronic systolic heart failure (HCC)    a. Echo 12/05/11:  EF 20-25%, diff HK with mild sparing of IL wall, mild AI, mod MR, mild LAE, mild RVE, mild reduced RVF.;  b.  Echo 05/11/2012:  Mild LVH, EF 20-25%, Gr 1 diast dysfn, mod MR, mild LAE   Depression    G6PD deficiency    GERD (gastroesophageal reflux disease)    Hepatitis    Hep C - treated.   HIV infection (HCC)    Hypertension    NICM (nonischemic cardiomyopathy) (HCC)    cardia CTA 8/13 negative for obstructive CAD   Pneumonia    Presence of permanent cardiac pacemaker    Stroke (HCC) 12/11/2021   VT (ventricular tachycardia) [facility])    Past Surgical History:  Procedure Laterality Date   BI-VENTRICULAR PACEMAKER INSERTION N/A 06/26/2013   Procedure: BI-VENTRICULAR PACEMAKER INSERTION (CRT-P);  Surgeon: Danelle LELON Birmingham, MD;  Location: [facility] CATH LAB;  Service: Cardiovascular;  Laterality: N/A;   BIV ICD GENERATOR CHANGEOUT N/A 01/24/2021   Procedure: BIV ICD GENERATOR CHANGEOUT;  Surgeon: Birmingham Danelle LELON, MD;  Location[facility] INVASIVE CV LAB;  Service: Cardiovascular;  Laterality: N/A;   COLONOSCOPY WITH PROPOFOL  N/A 08/14/2016   Procedure: COLONOSCOPY WITH PROPOFOL ;  Surgeon: Belvie Just, MD;  Location: [facility] ENDOSCOPY;  Service: Endoscopy;  Laterality: N/A;   ELECTROPHYSIOLOGY STUDY N/A 02/19/2012   Procedure: ELECTROPHYSIOLOGY STUDY;  Surgeon: Danelle LELON Birmingham, MD;  Location: [facility] CATH LAB;  Service: Cardiovascular;  Laterality: N/A;    ESOPHAGOGASTRODUODENOSCOPY  12/07/2011   Procedure: ESOPHAGOGASTRODUODENOSCOPY (EGD);  Surgeon: Gwendlyn ONEIDA Buddy, MD,FACG;  Location: [facility] ENDOSCOPY;  Service: Endoscopy;  Laterality: N/A;   FINGER SURGERY     Thumb laceration.     HARDWARE REMOVAL Right 07/01/2018   Procedure: HARDWARE REMOVAL;  Surgeon: Beverley Evalene BIRCH, MD;  Location: [facility];  Service: Orthopedics;  Laterality: Right;   INCISION AND DRAINAGE Right 07/01/2018   Procedure: INCISION AND DRAINAGE;  Surgeon: Beverley Evalene BIRCH, MD;  Location: [facility];  Service: Orthopedics;  Laterality: Right;   LEAD EXTRACTION N/A 11/16/2022   Procedure: LEAD EXTRACTION;  Surgeon: Birmingham Danelle LELON, MD;  Location: [facility] INVASIVE CV LAB;  Service: Cardiovascular;  Laterality: N/A;   LEFT HEART CATHETERIZATION WITH CORONARY ANGIOGRAM N/A 01/31/2014   Procedure: LEFT HEART CATHETERIZATION WITH CORONARY ANGIOGRAM;  Surgeon: Deatrice DELENA Cage, MD;  Location: [facility] CATH LAB;  Service: Cardiovascular;  Laterality: N/A;   ORIF ANKLE FRACTURE Right 01/26/2017   Procedure: OPEN REDUCTION INTERNAL FIXATION (ORIF) ANKLE FRACTURE;  Surgeon:  Beverley Evalene BIRCH, MD;  Location: [facility] OR;  Service: Orthopedics;  Laterality: Right;   TEE WITHOUT CARDIOVERSION N/A 11/16/2022   Procedure: TRANSESOPHAGEAL ECHOCARDIOGRAM;  Surgeon: Waddell Danelle ORN, MD;  Location: [facility] INVASIVE CV LAB;  Service: Cardiovascular;  Laterality: N/A;    Scheduled Meds:  allopurinol   50 mg Oral QODAY   amiodarone   100 mg Oral Daily   Chlorhexidine  Gluconate Cloth  6 each Topical Daily   colchicine   0.6 mg Oral Daily   dolutegravir   50 mg Oral Daily   emtricitabine -rilpivir-tenofovir  AF  1 tablet Oral Q breakfast   gabapentin   300 mg Oral BID   heparin   5,000 Units Subcutaneous Q8H   rosuvastatin   10 mg Oral Daily   traZODone   50 mg Oral QHS   Continuous Infusions:  famotidine  (PEPCID ) IV Stopped (01/21/24 2209)   lactated ringers  125 mL/hr at 01/22/24 0700   magnesium   sulfate bolus IVPB     potassium PHOSPHATE  IVPB (in mmol)     PRN Meds:.HYDROmorphone  (DILAUDID ) injection  Allergies  Allergen Reactions   Bactrim Rash   Sulfamethoxazole -Trimethoprim Rash   Social History   Socioeconomic History   Marital status: Single    Spouse name: Not on file   Number of children: Not on file   Years of education: Not on file   Highest education level: Not on file  Occupational History   Occupation: Unemployed  Tobacco Use   Smoking status: Never   Smokeless tobacco: Never  Vaping Use   Vaping status: Never Used  Substance and Sexual Activity   Alcohol use: Yes    Alcohol/week: 4.0 standard drinks of alcohol    Types: 4 Cans of beer per week    Comment: occ beer   Drug use: Not Currently    Types: Marijuana    Comment: accepted condoms   Sexual activity: Yes    Partners: Male    Comment: declined condoms  Other Topics Concern   Not on file  Social History Narrative   Lives alone.  Drinks beer daily.  Liquor rarely.  He occasionally smokes marijuana..   Social Drivers of Health   Financial Resource Strain: High Risk (02/05/2022)   Received from [facility]   Overall Financial Resource Strain (CARDIA)    Difficulty of Paying Living Expenses: Very hard  Food Insecurity: No Food Insecurity (01/20/2024)   Hunger Vital Sign    Worried About Running Out of Food in the Last Year: Never true    Ran Out of Food in the Last Year: Never true  Transportation Needs: No Transportation Needs (01/20/2024)   PRAPARE - Administrator, Civil Service (Medical): No    Lack of Transportation (Non-Medical): No  Physical Activity: Not on file  Stress: Not on file  Social Connections: Not on file  Intimate Partner Violence: Not At Risk (01/20/2024)   Humiliation, Afraid, Rape, and Kick questionnaire    Fear of Current or Ex-Partner: No    Emotionally Abused: No    Physically Abused: No    Sexually Abused: No   Family History  Problem Relation  Age of Onset   Lung cancer Mother    Colon cancer Other 50    Vitals  BP 117/66 (BP Location: Right Leg)   Pulse 75   Temp 98 F (36.7 C) (Oral)   Resp 17   Ht 5' 4 (1.626 m)   Wt 78.6 kg   SpO2 95%   BMI 29.74 kg/m    Physical  Exam Constitutional:  adult male lying in the bed, sleeping     Comments: dehydrated, HEENT wnl  Cardiovascular:     Rate and Rhythm: Normal rate and regular rhythm.     Heart sounds: s1s2  Pulmonary:     Effort: Pulmonary effort is normal.     Comments: Normal breath sounds   Abdominal:     Palpations: Abdomen is soft.     Tenderness: non distended and non tender  Musculoskeletal:        General: No swelling or tenderness in peripheral joints   Skin:    Comments: No rashes   Neurological:     General: awake, alert and oriented, non focal exam   Psychiatric:        Mood and Affect: Mood normal.    Pertinent Microbiology Results for orders placed or performed during the hospital encounter of 01/20/24  Culture, blood (routine x 2)     Status: None (Preliminary result)   Collection Time: 01/20/24 12:33 PM   Specimen: BLOOD  Result Value Ref Range Status   Specimen Description BLOOD SITE NOT SPECIFIED  Final   Special Requests   Final    BOTTLES DRAWN AEROBIC AND ANAEROBIC Blood Culture adequate volume   Culture   Final    NO GROWTH < 24 HOURS Performed at [facility], [address] [place], [place] [address]    Report Status PENDING  Incomplete  Culture, blood (routine x 2)     Status: None (Preliminary result)   Collection Time: 01/20/24 12:37 PM   Specimen: BLOOD  Result Value Ref Range Status   Specimen Description BLOOD SITE NOT SPECIFIED  Final   Special Requests   Final    BOTTLES DRAWN AEROBIC AND ANAEROBIC Blood Culture adequate volume   Culture   Final    NO GROWTH < 24 HOURS Performed at [facility], [address] [place], [place] [address]    Report Status PENDING  Incomplete  C Difficile Quick  Screen w PCR reflex     Status: None   Collection Time: 01/20/24  1:59 PM   Specimen: STOOL  Result Value Ref Range Status   C Diff antigen NEGATIVE NEGATIVE Final   C Diff toxin NEGATIVE NEGATIVE Final   C Diff interpretation No C. difficile detected.  Final    Comment: Performed at [facility], [address], [place], [place] [address]  MRSA Next Gen by PCR, Nasal     Status: None   Collection Time: 01/20/24  9:26 PM   Specimen: Nasal Mucosa; Nasal Swab  Result Value Ref Range Status   MRSA by PCR Next Gen NOT DETECTED NOT DETECTED Final    Comment: (NOTE) The GeneXpert MRSA Assay (FDA approved for NASAL specimens only), is one component of a comprehensive MRSA colonization surveillance program. It is not intended to diagnose MRSA infection nor to guide or monitor treatment for MRSA infections. Test performance is not FDA approved in patients less than 71 years old. Performed at [facility], [address] [place], [place] [address]   Gastrointestinal Panel by PCR , Stool     Status: Abnormal   Collection Time: 01/21/24 10:15 AM   Specimen: Stool  Result Value Ref Range Status   Campylobacter species NOT DETECTED NOT DETECTED Final   Plesimonas shigelloides NOT DETECTED NOT DETECTED Final   Salmonella species NOT DETECTED NOT DETECTED Final   Yersinia enterocolitica NOT DETECTED NOT DETECTED Final  Vibrio species NOT DETECTED NOT DETECTED Final   Vibrio cholerae NOT DETECTED NOT DETECTED Final   Enteroaggregative E coli (EAEC) NOT DETECTED NOT DETECTED Final   Enteropathogenic E coli (EPEC) NOT DETECTED NOT DETECTED Final   Enterotoxigenic E coli (ETEC) NOT DETECTED NOT DETECTED Final   Shiga like toxin producing E coli (STEC) NOT DETECTED NOT DETECTED Final   Shigella/Enteroinvasive E coli (EIEC) NOT DETECTED NOT DETECTED Final   Cryptosporidium DETECTED (A) NOT DETECTED Final   Cyclospora cayetanensis NOT DETECTED NOT DETECTED Final   Entamoeba histolytica  NOT DETECTED NOT DETECTED Final   Giardia lamblia NOT DETECTED NOT DETECTED Final   Adenovirus F40/41 NOT DETECTED NOT DETECTED Final   Astrovirus NOT DETECTED NOT DETECTED Final   Norovirus GI/GII NOT DETECTED NOT DETECTED Final   Rotavirus A NOT DETECTED NOT DETECTED Final   Sapovirus (I, II, IV, and V) NOT DETECTED NOT DETECTED Final    Comment: Performed at [facility], [address] [place], [place] [address]    Pertinent Lab seen by me:    Latest Ref Rng & Units 01/22/2024    2:26 AM 01/21/2024    5:55 AM 01/20/2024   10:45 PM  CBC  WBC 4.0 - 10.5 K/uL 8.8  10.8  14.1   Hemoglobin 13.0 - 17.0 g/dL 88.7  87.1  86.9   Hematocrit 39.0 - 52.0 % 32.9  38.0  39.4   Platelets 150 - 400 K/uL 137  158  169       Latest Ref Rng & Units 01/22/2024    2:26 AM 01/21/2024    5:55 AM 01/20/2024   10:45 PM  CMP  Glucose 70 - 99 mg/dL 89  883    BUN 6 - 20 mg/dL 13  30    Creatinine 9.38 - 1.24 mg/dL 8.57  6.35  3.71   Sodium 135 - 145 mmol/L 138  137    Potassium 3.5 - 5.1 mmol/L 3.5  3.2    Chloride 98 - 111 mmol/L 116  113    CO2 22 - 32 mmol/L 17  13    Calcium  8.9 - 10.3 mg/dL 8.1  7.9      Pertinent Imagings/Other Imagings Plain films and CT images have been personally visualized and interpreted; radiology reports have been reviewed. Decision making incorporated into the Impression / Recommendations.  ECHOCARDIOGRAM COMPLETE Result Date: 01/21/2024    ECHOCARDIOGRAM REPORT   Patient Name:   [name] Date of Exam: 01/21/2024 Medical Rec #:  [id number]        Height:       64.0 in Accession #:    [id number]       Weight:       176.4 lb Date of Birth:  Feb 23, 1966        BSA:          1.855 m Patient Age:    58 years         BP:           100/67 mmHg Patient Gender: M                HR:           73 bpm. Exam Location:  Inpatient Procedure: 2D Echo, Color Doppler and Cardiac Doppler (Both Spectral and Color            Flow Doppler were utilized during procedure).  Indications:    Shock  History:  Patient has prior history of Echocardiogram examinations, most                 recent 07/23/2023. Sepsis.  Sonographer:    Benard Stallion Referring Phys: [id number] NAVEED SHAH IMPRESSIONS  1. Left ventricular ejection fraction, by estimation, is 45 to 50%. The left ventricle has mildly decreased function. The left ventricle demonstrates global hypokinesis. Left ventricular diastolic parameters are consistent with Grade I diastolic dysfunction (impaired relaxation).  2. Right ventricular systolic function is normal. The right ventricular size is normal. There is normal pulmonary artery systolic pressure.  3. The mitral valve is normal in structure. Trivial mitral valve regurgitation. No evidence of mitral stenosis.  4. The aortic valve is tricuspid. There is mild calcification of the aortic valve. Aortic valve regurgitation is trivial. No aortic stenosis is present.  5. The inferior vena cava is normal in size with greater than 50% respiratory variability, suggesting right atrial pressure of 3 mmHg. Comparison(s): No significant change from prior study. Conclusion(s)/Recommendation(s): Otherwise normal echocardiogram, with minor abnormalities described in the report. FINDINGS  Left Ventricle: Left ventricular ejection fraction, by estimation, is 45 to 50%. The left ventricle has mildly decreased function. The left ventricle demonstrates global hypokinesis. The left ventricular internal cavity size was normal in size. There is  no left ventricular hypertrophy. Left ventricular diastolic parameters are consistent with Grade I diastolic dysfunction (impaired relaxation). Right Ventricle: The right ventricular size is normal. No increase in right ventricular wall thickness. Right ventricular systolic function is normal. There is normal pulmonary artery systolic pressure. The tricuspid regurgitant velocity is 2.61 m/s, and  with an assumed right atrial pressure of 3 mmHg, the  estimated right ventricular systolic pressure is 30.2 mmHg. Left Atrium: Left atrial size was normal in size. Right Atrium: Right atrial size was normal in size. Pericardium: Trivial pericardial effusion is present. Presence of epicardial fat layer. Mitral Valve: The mitral valve is normal in structure. Trivial mitral valve regurgitation. No evidence of mitral valve stenosis. Tricuspid Valve: The tricuspid valve is normal in structure. Tricuspid valve regurgitation is mild . No evidence of tricuspid stenosis. Aortic Valve: The aortic valve is tricuspid. There is mild calcification of the aortic valve. Aortic valve regurgitation is trivial. No aortic stenosis is present. Aortic valve mean gradient measures 3.0 mmHg. Aortic valve peak gradient measures 5.6 mmHg. Aortic valve area, by VTI measures 2.82 cm. Pulmonic Valve: The pulmonic valve was not well visualized. Pulmonic valve regurgitation is not visualized. No evidence of pulmonic stenosis. Aorta: The aortic root, ascending aorta, aortic arch and descending aorta are all structurally normal, with no evidence of dilitation or obstruction. Venous: The inferior vena cava is normal in size with greater than 50% respiratory variability, suggesting right atrial pressure of 3 mmHg. IAS/Shunts: The atrial septum is grossly normal. Additional Comments: A device lead is visualized in the right atrium and right ventricle.  LEFT VENTRICLE PLAX 2D LVIDd:         4.70 cm     Diastology LVIDs:         3.20 cm     LV e' medial:    9.68 cm/s LV PW:         0.80 cm     LV E/e' medial:  5.0 LV IVS:        0.80 cm     LV e' lateral:   10.40 cm/s LVOT diam:     2.20 cm     LV E/e' lateral: 4.6 LV  SV:         61 LV SV Index:   33 LVOT Area:     3.80 cm  LV Volumes (MOD) LV vol d, MOD A2C: 88.6 ml LV vol d, MOD A4C: 98.4 ml LV vol s, MOD A2C: 55.1 ml LV vol s, MOD A4C: 53.7 ml LV SV MOD A2C:     33.5 ml LV SV MOD A4C:     98.4 ml LV SV MOD BP:      40.5 ml RIGHT VENTRICLE RV Basal  diam:  2.50 cm RV Mid diam:    2.50 cm RV S prime:     14.30 cm/s TAPSE (M-mode): 2.7 cm LEFT ATRIUM           Index        RIGHT ATRIUM           Index LA diam:      3.10 cm 1.67 cm/m   RA Area:     13.60 cm LA Vol (A2C): 47.7 ml 25.72 ml/m  RA Volume:   26.70 ml  14.40 ml/m LA Vol (A4C): 32.6 ml 17.58 ml/m  AORTIC VALVE AV Area (Vmax):    2.88 cm AV Area (Vmean):   2.84 cm AV Area (VTI):     2.82 cm AV Vmax:           118.00 cm/s AV Vmean:          78.700 cm/s AV VTI:            0.217 m AV Peak Grad:      5.6 mmHg AV Mean Grad:      3.0 mmHg LVOT Vmax:         89.30 cm/s LVOT Vmean:        58.900 cm/s LVOT VTI:          0.161 m LVOT/AV VTI ratio: 0.74  AORTA Ao Root diam: 3.50 cm MITRAL VALVE               TRICUSPID VALVE MV Area (PHT): 5.27 cm    TR Peak grad:   27.2 mmHg MV Decel Time: 144 msec    TR Vmax:        261.00 cm/s MR Peak grad: 15.2 mmHg MR Vmax:      195.00 cm/s  SHUNTS MV E velocity: 48.00 cm/s  Systemic VTI:  0.16 m MV A velocity: 50.10 cm/s  Systemic Diam: 2.20 cm MV E/A ratio:  0.96 Shelda Bruckner MD Electronically signed by Shelda Bruckner MD Signature Date/Time: 01/21/2024/11:48:07 AM    Final    DG Abd 1 View Result Date: 01/21/2024 CLINICAL DATA:  10056.Diarrhea. EXAM: ABDOMEN - 1 VIEW COMPARISON:  CT without contrast from earlier today. FINDINGS: 11:51 p.m. The bowel gas pattern is nonobstructive with a general paucity of small bowel aeration, with scattered aeration of the large bowel all the way through. No radio-opaque calculi or other significant radiographic abnormality are seen. IMPRESSION: Nonobstructive bowel gas pattern. No acute radiographic findings or changes. Electronically Signed   By: Francis Quam M.D.   On: 01/21/2024 00:21   CT ABDOMEN PELVIS WO CONTRAST Result Date: 01/20/2024 CLINICAL DATA:  Abdominal pain, acute, nonlocalized. EXAM: CT ABDOMEN AND PELVIS WITHOUT CONTRAST TECHNIQUE: Multidetector CT imaging of the abdomen and pelvis was  performed following the standard protocol without IV contrast. RADIATION DOSE REDUCTION: This exam was performed according to the departmental dose-optimization program which includes automated exposure control, adjustment of the mA and/or kV according  to patient size and/or use of iterative reconstruction technique. COMPARISON:  CT scan abdomen from 05/17/2018. FINDINGS: Lower chest: There are patchy atelectatic changes in the visualized lung bases. No overt consolidation. There are also moderate sized emphysematous bullae in the bilateral lung bases. No pleural effusion. The heart is normal in size. No pericardial effusion. Hepatobiliary: The liver is normal in size. Non-cirrhotic configuration. No suspicious mass. No intrahepatic or extrahepatic bile duct dilation. There is a single calcified gallstone without imaging signs of acute cholecystitis. Normal gallbladder wall thickness. No pericholecystic inflammatory changes. Pancreas: Unremarkable. No pancreatic ductal dilatation or surrounding inflammatory changes. Spleen: Within normal limits. No focal lesion. Adrenals/Urinary Tract: Adrenal glands are unremarkable. No suspicious renal mass within the limitations of this unenhanced exam. There is a partially exophytic 0.3 x 3.3 cm cyst arising from the left kidney lower pole. No nephroureterolithiasis or obstructive uropathy. Urinary bladder is under distended, precluding optimal assessment. However, no large mass or stones identified. No perivesical fat stranding. Stomach/Bowel: No disproportionate dilation of the small or large bowel loops. No evidence of abnormal bowel wall thickening or inflammatory changes. The appendix was not visualized; however there is no acute inflammatory process in the right lower quadrant. Vascular/Lymphatic: No ascites or pneumoperitoneum. No abdominal or pelvic lymphadenopathy, by size criteria. No aneurysmal dilation of the major abdominal arteries. There are mild peripheral  atherosclerotic vascular calcifications of the aorta and its major branches. Reproductive: Normal size prostate. Symmetric seminal vesicles. Other: There are small fat containing umbilical and right inguinal hernias. The soft tissues and abdominal wall are otherwise unremarkable. Musculoskeletal: No suspicious osseous lesions. There are mild multilevel degenerative changes in the visualized spine. IMPRESSION: 1. No acute inflammatory process identified within the abdomen or pelvis. 2. Multiple other nonacute observations, as described above. Aortic Atherosclerosis (ICD10-I70.0). Electronically Signed   By: Ree Molt M.D.   On: 01/20/2024 17:40   DG Chest Port 1 View Result Date: 01/20/2024 CLINICAL DATA:  Weakness.  Syncopal episodes.  Emesis and diarrhea. EXAM: PORTABLE CHEST 1 VIEW COMPARISON:  11/17/2022 FINDINGS: Pacemaker/AICD remains in place. Heart size is normal. The lungs are clear. The vascularity is normal. No effusions. IMPRESSION: No active disease. Pacemaker/AICD. Electronically Signed   By: Oneil Officer M.D.   On: 01/20/2024 13:03    I spent 85 minutes involved in face-to-face and non-face-to-face activities for this patient on the day of the visit. Professional time spent includes the following activities: Preparing to see the patient (review of tests), Obtaining and reviewing separately obtained history 9( ED note, H and P note, ICU progress note), Performing a medically appropriate examination and evaluation , Ordering medications/labs, referring and communicating with other health care professionals including pharmacy, Documenting clinical information in the EMR, Independently interpreting results (not separately reported), Communicating results to the patient, Counseling and educating the patient and Care coordination (not separately reported).  Electronically signed by:   Plan d/w requesting provider as well as ID pharm D  Of note, portions of this note may have been created with  voice recognition software. While this note has been edited for accuracy, occasional wrong-word or 'sound-a-like' substitutions may have occurred due to the inherent limitations of voice recognition software.   Annalee Orem, MD Infectious Disease Physician [facility] Pager: [phone]

## 2024-01-22 NOTE — Plan of Care (Signed)

## 2024-01-23 DIAGNOSIS — A072 Cryptosporidiosis: Secondary | ICD-10-CM

## 2024-01-23 DIAGNOSIS — A419 Sepsis, unspecified organism: Secondary | ICD-10-CM

## 2024-01-23 DIAGNOSIS — R6521 Severe sepsis with septic shock: Secondary | ICD-10-CM

## 2024-01-23 LAB — CBC WITH DIFFERENTIAL/PLATELET
Abs Immature Granulocytes: 0.02 K/uL (ref 0.00–0.07)
Basophils Absolute: 0 K/uL (ref 0.0–0.1)
Basophils Relative: 0 %
Eosinophils Absolute: 0.2 K/uL (ref 0.0–0.5)
Eosinophils Relative: 2 %
HCT: 33.4 % — ABNORMAL LOW (ref 39.0–52.0)
Hemoglobin: 11.2 g/dL — ABNORMAL LOW (ref 13.0–17.0)
Immature Granulocytes: 0 %
Lymphocytes Relative: 32 %
Lymphs Abs: 3.1 K/uL (ref 0.7–4.0)
MCH: 30.6 pg (ref 26.0–34.0)
MCHC: 33.5 g/dL (ref 30.0–36.0)
MCV: 91.3 fL (ref 80.0–100.0)
Monocytes Absolute: 1 K/uL (ref 0.1–1.0)
Monocytes Relative: 11 %
Neutro Abs: 5.3 K/uL (ref 1.7–7.7)
Neutrophils Relative %: 55 %
Platelets: 143 K/uL — ABNORMAL LOW (ref 150–400)
RBC: 3.66 MIL/uL — ABNORMAL LOW (ref 4.22–5.81)
RDW: 12.8 % (ref 11.5–15.5)
WBC: 9.6 K/uL (ref 4.0–10.5)
nRBC: 0 % (ref 0.0–0.2)

## 2024-01-23 LAB — BASIC METABOLIC PANEL WITH GFR
Anion gap: 8 (ref 5–15)
BUN: <5 mg/dL — ABNORMAL LOW (ref 6–20)
CO2: 20 mmol/L — ABNORMAL LOW (ref 22–32)
Calcium: 8.1 mg/dL — ABNORMAL LOW (ref 8.9–10.3)
Chloride: 113 mmol/L — ABNORMAL HIGH (ref 98–111)
Creatinine, Ser: 1.06 mg/dL (ref 0.61–1.24)
GFR, Estimated: >60 mL/min
Glucose, Bld: 88 mg/dL (ref 70–99)
Potassium: 3.7 mmol/L (ref 3.5–5.1)
Sodium: 141 mmol/L (ref 135–145)

## 2024-01-23 LAB — MAGNESIUM: Magnesium: 2.1 mg/dL (ref 1.7–2.4)

## 2024-01-23 LAB — PHOSPHORUS: Phosphorus: 2.5 mg/dL (ref 2.5–4.6)

## 2024-01-23 MED ORDER — ALLOPURINOL 100 MG PO TABS
100.0000 mg | ORAL_TABLET | Freq: Two times a day (BID) | ORAL | Status: DC
  Administered 2024-01-23 – 2024-02-01 (×18): 100 mg via ORAL
  Filled 2024-01-23 (×18): qty 1

## 2024-01-23 NOTE — Plan of Care (Signed)

## 2024-01-23 NOTE — Progress Notes (Signed)
 PROGRESS NOTE    KARRIEM MUENCH  FMW:[id number] DOB: 05-04-1966 DOA: 01/20/2024 PCP: Campbell Reynolds, NP   Brief Narrative:  58 y/o male with PMH of well controlled HIV -recent CD4 719 and undetectable viral load. 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  Found to have Cryptosporidium positive gastroenteritis in the ICU, blood pressure has since resolved no longer requiring pressors and transitioned to medical floor, hospitalist team to pick up on 01/23/2024.  At this time patient appears to be improving, prior AKI likely in setting of profound dehydration and hypovolemia is essentially resolved he is now back to baseline.  Infectious disease following, appreciate insight recommendations, tentative plan for discharge in the next 24 to 48 hours pending antibiotic needs and infectious course.  He continues to have loose/soft stool but denies any overt diarrhea.  Nausea vomiting resolved.  Assessment & Plan:   Principal Problem:   Sepsis (HCC) Active Problems:   Diarrhea due to cryptosporidium (HCC)  Shock, likely overlapping hypovolemic versus septic, POA -Secondary to overt GI infection with profound dehydration -Off Levophed  now > 48 hours -Continue IV fluids, increase p.o. intake as appropriate - Sepsis criteria met in the setting of tachypnea, leukocytosis with notable source of infection requiring pressors meeting septic shock criteria  Acute Gastroenteritis , stool PCR positive for Cryptosporidium - ID consulted, appreciate insight/recs - not considered immunocompromised and potentially to add anti microbial tx Nitazoxanide  on 8/25 (not available currently in pharmacy)  - Stool occult pos-monitor h/h - Tincture of opium  for symptomatic relief  Acute Kidney Injury ,  improving - Secondary to hypovolemia and ATN - IV fluids   Syncope, Secondary to low BP state H/o Systolic CHF s/p ICD/Pacemaker Echo shows mildly decreased LV function 45 to 50% with global hypokinesis, he has outpatient follow-up with heart failure service  NAGMA Secondary to diarrhea, improving Avoid hyperchloremic fluids Hypokalemia and hypophosphatemia repleted -expect to improve with diet advancement  Well controlled HIV, chronic Unlikely to be HIV related since his Viral load is undetectable and his CD4 is over 700 Continue home HIV meds Odefsey  and Tivicay    H/o Hep C Has been treated with antiretrovirals Monitor liver functions  H/o Neuropathy P.o meds when able to take po  DVT prophylaxis: heparin  injection 5,000 Units Start: 01/21/24 0600 SCDs Start: 01/20/24 2126   Code Status:   Code Status: Full Code  Family Communication: None present  Status is: Inpatient  Dispo: The patient is from: Home              Anticipated d/c is to: Home              Anticipated d/c date is: 24 to 48 hours              Patient currently not medically stable for discharge given need for ongoing evaluation by infectious disease/to ensure resolution of symptoms as above  Consultants:  PCCM, infectious disease  Procedures:  None  Antimicrobials:  None  (other than HIV antivirals: Tivicay , Odefsey )  Subjective: No acute issues or events overnight, continues to report ongoing soft stool but no further episodes of profound liquidy diarrhea.  Otherwise denies nausea vomiting headache fevers chills chest pain shortness of breath cough or sputum production.  Objective: Vitals:   01/22/24 1600 01/22/24 2122 01/23/24 0047  01/23/24 0600  BP: 112/84 114/77 107/82 104/82  Pulse: 80 80 85 79  Resp: 16 18 18 18   Temp: 98.2 F (36.8 C) 98.7 F (37.1 C) 98.4 F (36.9 C) 98.6 F (37 C)  TempSrc: Oral Oral Oral Oral  SpO2: 99% 98% 96% 99%  Weight:    76.1 kg  Height:         Intake/Output Summary (Last 24 hours) at 01/23/2024 0737 Last data filed at 01/23/2024 0600 Gross per 24 hour  Intake 565.18 ml  Output 600 ml  Net -34.82 ml   Filed Weights   01/21/24 0422 01/22/24 0307 01/23/24 0600  Weight: 80 kg 78.6 kg 76.1 kg    Examination:  General:  Pleasantly resting in bed, No acute distress. HEENT:  Normocephalic atraumatic.  Sclerae nonicteric, noninjected.  Extraocular movements intact bilaterally. Neck:  Without mass or deformity.  Trachea is midline. Lungs:  Clear to auscultate bilaterally without rhonchi, wheeze, or rales. Heart:  Regular rate and rhythm.  Without murmurs, rubs, or gallops. Abdomen:  Soft, nontender, nondistended.  Without guarding or rebound. Extremities: Without cyanosis, clubbing, edema, or obvious deformity. Skin:  Warm and dry, no erythema.  Data Reviewed: I have personally reviewed following labs and imaging studies  CBC: Recent Labs  Lab 01/20/24 1226 01/20/24 1255 01/20/24 2245 01/21/24 0555 01/22/24 0226 01/23/24 0510  WBC 11.3*  --  14.1* 10.8* 8.8 9.6  NEUTROABS  --   --   --   --  5.4 5.3  HGB 15.2 17.0 13.0 12.8* 11.2* 11.2*  HCT 45.9 50.0 39.4 38.0* 32.9* 33.4*  MCV 93.3  --  93.4 91.8 90.4 91.3  PLT 205  --  169 158 137* 143*   Basic Metabolic Panel: Recent Labs  Lab 01/20/24 1222 01/20/24 1226 01/20/24 1226 01/20/24 1255 01/20/24 2245 01/21/24 0555 01/22/24 0226 01/23/24 0510  NA  --  135  --  133*  --  137 138 141  K  --  4.4  --  4.2  --  3.2* 3.5 3.7  CL  --  96*  --  106  --  113* 116* 113*  CO2  --  14*  --   --   --  13* 17* 20*  GLUCOSE  --  110*  --  103*  --  116* 89 88  BUN  --  46*  --  46*  --  30* 13 <5*  CREATININE  --  8.26*   < > 8.90* 6.28* 3.64* 1.42* 1.06  CALCIUM   --  9.4  --   --   --  7.9* 8.1* 8.1*  MG 2.8*  --   --   --   --  2.1 1.7 2.1  PHOS 8.9*  --   --   --   --  3.2 1.6* 2.5   < > = values in this interval not displayed.   GFR: Estimated Creatinine  Clearance: 69.2 mL/min (by C-G formula based on SCr of 1.06 mg/dL).  Liver Function Tests: Recent Labs  Lab 01/20/24 1226  AST 40  ALT 26  ALKPHOS 75  BILITOT 0.7  PROT 9.0*  ALBUMIN 4.5   Recent Labs  Lab 01/20/24 1226  LIPASE 53*   Coagulation Profile: Recent Labs  Lab 01/20/24 1255  INR 1.0   HbA1C: Recent Labs    01/20/24 2245  HGBA1C 5.5   CBG: Recent Labs  Lab 01/21/24 1106 01/21/24 1602 01/21/24 1941 01/21/24 2319 01/22/24 0339  GLUCAP 102*  93 87 95 87   Sepsis Labs: Recent Labs  Lab 01/20/24 1256  LATICACIDVEN 1.4    Recent Results (from the past 240 hours)  Culture, blood (routine x 2)     Status: None (Preliminary result)   Collection Time: 01/20/24 12:33 PM   Specimen: BLOOD  Result Value Ref Range Status   Specimen Description BLOOD SITE NOT SPECIFIED  Final   Special Requests   Final    BOTTLES DRAWN AEROBIC AND ANAEROBIC Blood Culture adequate volume   Culture   Final    NO GROWTH 2 DAYS Performed at [facility], [address], [place], [place] [address]    Report Status PENDING  Incomplete  Culture, blood (routine x 2)     Status: None (Preliminary result)   Collection Time: 01/20/24 12:37 PM   Specimen: BLOOD  Result Value Ref Range Status   Specimen Description BLOOD SITE NOT SPECIFIED  Final   Special Requests   Final    BOTTLES DRAWN AEROBIC AND ANAEROBIC Blood Culture adequate volume   Culture   Final    NO GROWTH 2 DAYS Performed at [facility], [address] [place], [place] [address]    Report Status PENDING  Incomplete  C Difficile Quick Screen w PCR reflex     Status: None   Collection Time: 01/20/24  1:59 PM   Specimen: STOOL  Result Value Ref Range Status   C Diff antigen NEGATIVE NEGATIVE Final   C Diff toxin NEGATIVE NEGATIVE Final   C Diff interpretation No C. difficile detected.  Final    Comment: Performed at [facility], [address] [place], [place] [address]  MRSA Next Gen by  PCR, Nasal     Status: None   Collection Time: 01/20/24  9:26 PM   Specimen: Nasal Mucosa; Nasal Swab  Result Value Ref Range Status   MRSA by PCR Next Gen NOT DETECTED NOT DETECTED Final    Comment: (NOTE) The GeneXpert MRSA Assay (FDA approved for NASAL specimens only), is one component of a comprehensive MRSA colonization surveillance program. It is not intended to diagnose MRSA infection nor to guide or monitor treatment for MRSA infections. Test performance is not FDA approved in patients less than 45 years old. Performed at [facility], [address] [place], [place] [address]   Gastrointestinal Panel by PCR , Stool     Status: Abnormal   Collection Time: 01/21/24 10:15 AM   Specimen: Stool  Result Value Ref Range Status   Campylobacter species NOT DETECTED NOT DETECTED Final   Plesimonas shigelloides NOT DETECTED NOT DETECTED Final   Salmonella species NOT DETECTED NOT DETECTED Final   Yersinia enterocolitica NOT DETECTED NOT DETECTED Final   Vibrio species NOT DETECTED NOT DETECTED Final   Vibrio cholerae NOT DETECTED NOT DETECTED Final   Enteroaggregative E coli (EAEC) NOT DETECTED NOT DETECTED Final   Enteropathogenic E coli (EPEC) NOT DETECTED NOT DETECTED Final   Enterotoxigenic E coli (ETEC) NOT DETECTED NOT DETECTED Final   Shiga like toxin producing E coli (STEC) NOT DETECTED NOT DETECTED Final   Shigella/Enteroinvasive E coli (EIEC) NOT DETECTED NOT DETECTED Final   Cryptosporidium DETECTED (A) NOT DETECTED Final   Cyclospora cayetanensis NOT DETECTED NOT DETECTED Final   Entamoeba histolytica NOT DETECTED NOT DETECTED Final   Giardia lamblia NOT DETECTED NOT DETECTED Final   Adenovirus F40/41 NOT DETECTED NOT DETECTED Final   Astrovirus NOT DETECTED NOT DETECTED  Final   Norovirus GI/GII NOT DETECTED NOT DETECTED Final   Rotavirus A NOT DETECTED NOT DETECTED Final   Sapovirus (I, II, IV, and V) NOT DETECTED NOT DETECTED Final    Comment: Performed at  [facility], [address], [place], [place] [address]         Radiology Studies: ECHOCARDIOGRAM COMPLETE Result Date: 01/21/2024    ECHOCARDIOGRAM REPORT   Patient Name:   MIKIE MISNER Date of Exam: 01/21/2024 Medical Rec #:  [id number]        Height:       64.0 in Accession #:    [id number]       Weight:       176.4 lb Date of Birth:  1965/12/11        BSA:          1.855 m Patient Age:    58 years         BP:           100/67 mmHg Patient Gender: M                HR:           73 bpm. Exam Location:  Inpatient Procedure: 2D Echo, Color Doppler and Cardiac Doppler (Both Spectral and Color            Flow Doppler were utilized during procedure). Indications:    Shock  History:        Patient has prior history of Echocardiogram examinations, most                 recent 07/23/2023. Sepsis.  Sonographer:    Benard Stallion Referring Phys: [id number] NAVEED SHAH IMPRESSIONS  1. Left ventricular ejection fraction, by estimation, is 45 to 50%. The left ventricle has mildly decreased function. The left ventricle demonstrates global hypokinesis. Left ventricular diastolic parameters are consistent with Grade I diastolic dysfunction (impaired relaxation).  2. Right ventricular systolic function is normal. The right ventricular size is normal. There is normal pulmonary artery systolic pressure.  3. The mitral valve is normal in structure. Trivial mitral valve regurgitation. No evidence of mitral stenosis.  4. The aortic valve is tricuspid. There is mild calcification of the aortic valve. Aortic valve regurgitation is trivial. No aortic stenosis is present.  5. The inferior vena cava is normal in size with greater than 50% respiratory variability, suggesting right atrial pressure of 3 mmHg. Comparison(s): No significant change from prior study. Conclusion(s)/Recommendation(s): Otherwise normal echocardiogram, with minor abnormalities described in the report. FINDINGS  Left Ventricle: Left ventricular  ejection fraction, by estimation, is 45 to 50%. The left ventricle has mildly decreased function. The left ventricle demonstrates global hypokinesis. The left ventricular internal cavity size was normal in size. There is  no left ventricular hypertrophy. Left ventricular diastolic parameters are consistent with Grade I diastolic dysfunction (impaired relaxation). Right Ventricle: The right ventricular size is normal. No increase in right ventricular wall thickness. Right ventricular systolic function is normal. There is normal pulmonary artery systolic pressure. The tricuspid regurgitant velocity is 2.61 m/s, and  with an assumed right atrial pressure of 3 mmHg, the estimated right ventricular systolic pressure is 30.2 mmHg. Left Atrium: Left atrial size was normal in size. Right Atrium: Right atrial size was normal in size. Pericardium: Trivial pericardial effusion is present. Presence of epicardial fat layer. Mitral Valve: The mitral valve is normal in structure. Trivial mitral valve regurgitation. No evidence of mitral valve stenosis. Tricuspid Valve:  The tricuspid valve is normal in structure. Tricuspid valve regurgitation is mild . No evidence of tricuspid stenosis. Aortic Valve: The aortic valve is tricuspid. There is mild calcification of the aortic valve. Aortic valve regurgitation is trivial. No aortic stenosis is present. Aortic valve mean gradient measures 3.0 mmHg. Aortic valve peak gradient measures 5.6 mmHg. Aortic valve area, by VTI measures 2.82 cm. Pulmonic Valve: The pulmonic valve was not well visualized. Pulmonic valve regurgitation is not visualized. No evidence of pulmonic stenosis. Aorta: The aortic root, ascending aorta, aortic arch and descending aorta are all structurally normal, with no evidence of dilitation or obstruction. Venous: The inferior vena cava is normal in size with greater than 50% respiratory variability, suggesting right atrial pressure of 3 mmHg. IAS/Shunts: The atrial  septum is grossly normal. Additional Comments: A device lead is visualized in the right atrium and right ventricle.  LEFT VENTRICLE PLAX 2D LVIDd:         4.70 cm     Diastology LVIDs:         3.20 cm     LV e' medial:    9.68 cm/s LV PW:         0.80 cm     LV E/e' medial:  5.0 LV IVS:        0.80 cm     LV e' lateral:   10.40 cm/s LVOT diam:     2.20 cm     LV E/e' lateral: 4.6 LV SV:         61 LV SV Index:   33 LVOT Area:     3.80 cm  LV Volumes (MOD) LV vol d, MOD A2C: 88.6 ml LV vol d, MOD A4C: 98.4 ml LV vol s, MOD A2C: 55.1 ml LV vol s, MOD A4C: 53.7 ml LV SV MOD A2C:     33.5 ml LV SV MOD A4C:     98.4 ml LV SV MOD BP:      40.5 ml RIGHT VENTRICLE RV Basal diam:  2.50 cm RV Mid diam:    2.50 cm RV S prime:     14.30 cm/s TAPSE (M-mode): 2.7 cm LEFT ATRIUM           Index        RIGHT ATRIUM           Index LA diam:      3.10 cm 1.67 cm/m   RA Area:     13.60 cm LA Vol (A2C): 47.7 ml 25.72 ml/m  RA Volume:   26.70 ml  14.40 ml/m LA Vol (A4C): 32.6 ml 17.58 ml/m  AORTIC VALVE AV Area (Vmax):    2.88 cm AV Area (Vmean):   2.84 cm AV Area (VTI):     2.82 cm AV Vmax:           118.00 cm/s AV Vmean:          78.700 cm/s AV VTI:            0.217 m AV Peak Grad:      5.6 mmHg AV Mean Grad:      3.0 mmHg LVOT Vmax:         89.30 cm/s LVOT Vmean:        58.900 cm/s LVOT VTI:          0.161 m LVOT/AV VTI ratio: 0.74  AORTA Ao Root diam: 3.50 cm MITRAL VALVE  TRICUSPID VALVE MV Area (PHT): 5.27 cm    TR Peak grad:   27.2 mmHg MV Decel Time: 144 msec    TR Vmax:        261.00 cm/s MR Peak grad: 15.2 mmHg MR Vmax:      195.00 cm/s  SHUNTS MV E velocity: 48.00 cm/s  Systemic VTI:  0.16 m MV A velocity: 50.10 cm/s  Systemic Diam: 2.20 cm MV E/A ratio:  0.96 Shelda Bruckner MD Electronically signed by Shelda Bruckner MD Signature Date/Time: 01/21/2024/11:48:07 AM    Final    Scheduled Meds:  allopurinol   50 mg Oral QODAY   amiodarone   100 mg Oral Daily   colchicine   0.6 mg Oral Daily    dolutegravir   50 mg Oral Daily   emtricitabine -rilpivir-tenofovir  AF  1 tablet Oral Q breakfast   gabapentin   300 mg Oral BID   heparin   5,000 Units Subcutaneous Q8H   Opium   6 mg Oral QID   rosuvastatin   10 mg Oral Daily   traZODone   50 mg Oral QHS   Continuous Infusions:  famotidine  (PEPCID ) IV 20 mg (01/22/24 2128)     LOS: 3 days   Time spent:  Elsie JAYSON Montclair, DO [facility]  If 7PM-7AM, please contact night-coverage [email or web address]  01/23/2024, 7:37 AM

## 2024-01-24 ENCOUNTER — Telehealth (HOSPITAL_COMMUNITY): Payer: Self-pay

## 2024-01-24 ENCOUNTER — Other Ambulatory Visit (HOSPITAL_COMMUNITY): Payer: Self-pay

## 2024-01-24 DIAGNOSIS — A419 Sepsis, unspecified organism: Secondary | ICD-10-CM | POA: Diagnosis not present

## 2024-01-24 DIAGNOSIS — R6521 Severe sepsis with septic shock: Secondary | ICD-10-CM | POA: Diagnosis not present

## 2024-01-24 DIAGNOSIS — G9341 Metabolic encephalopathy: Secondary | ICD-10-CM | POA: Diagnosis not present

## 2024-01-24 MED ORDER — NITAZOXANIDE 500 MG PO TABS
500.0000 mg | ORAL_TABLET | Freq: Two times a day (BID) | ORAL | Status: DC
  Administered 2024-01-24 – 2024-02-01 (×17): 500 mg via ORAL
  Filled 2024-01-24 (×22): qty 1

## 2024-01-24 MED ORDER — HYDROMORPHONE HCL 1 MG/ML IJ SOLN
0.5000 mg | INTRAMUSCULAR | Status: AC | PRN
  Administered 2024-01-24 – 2024-01-25 (×2): 0.5 mg via INTRAVENOUS
  Filled 2024-01-24 (×2): qty 1

## 2024-01-24 MED ORDER — GABAPENTIN 300 MG PO CAPS
300.0000 mg | ORAL_CAPSULE | Freq: Three times a day (TID) | ORAL | Status: DC
  Administered 2024-01-24 – 2024-02-01 (×23): 300 mg via ORAL
  Filled 2024-01-24 (×23): qty 1

## 2024-01-24 NOTE — Progress Notes (Signed)
 [facility]  Date of Admission:  01/20/2024     Reason for Follow Up: Sepsis [facility])  Total days of antibiotics 2         ASSESSMENT:  Mr. Pettey is a 58 y/o AA male with well controlled HIV disease presenting with profuse watery diarrhea with associated nausea/vomiting and fevers and found to have Cryptosporidium diarrhea.  Mr. Old has had increased diarrhea today and discussed plan to start on nitazoxanide  and will need at least 14 days of treatment in addition to hydration and electrolyte replacement as indicated. HIV remains well controlled with most recent lab work on 11/10/23 showing viral load undetectable and CD4 count 719. Continue current dose of Tivicay  and Odefsey .  Will check precautions with IP. Remaining medical and supportive care per Internal Medicine.   PLAN:  Start nitazoxanide  for cryptosporidium infection Oral/IV rehydration and electrolyte replacement as needed. Continue symptomatic treatment with loperamide  as needed. Continue Odefsey  and Tivicay  for ART.  Will check with IP regarding Precautions. Remaining medical and supportive care per Internal Medicine.   Principal Problem:   Sepsis (HCC) Active Problems:   Diarrhea due to cryptosporidium (HCC)    allopurinol   100 mg Oral BID   amiodarone   100 mg Oral Daily   colchicine   0.6 mg Oral Daily   dolutegravir   50 mg Oral Daily   emtricitabine -rilpivir-tenofovir  AF  1 tablet Oral Q breakfast   gabapentin   300 mg Oral BID   heparin   5,000 Units Subcutaneous Q8H   nitazoxanide   500 mg Oral Q12H   Opium   6 mg Oral QID   rosuvastatin   10 mg Oral Daily   traZODone   50 mg Oral QHS    SUBJECTIVE:  Afebrile overnight with no acute events. Better day yesterday with less diarrhea however today having increased abdominal cramping at times with diarrhea. Drinks bottled water from the store.   Allergies  Allergen Reactions   Bactrim Rash   Sulfamethoxazole -Trimethoprim Rash      Review of Systems: Review of Systems  Constitutional:  Negative for chills, fever and weight loss.  Respiratory:  Negative for cough, shortness of breath and wheezing.   Cardiovascular:  Negative for chest pain and leg swelling.  Gastrointestinal:  Positive for diarrhea. Negative for abdominal pain, constipation, nausea and vomiting.  Skin:  Negative for rash.      OBJECTIVE: Vitals:   01/23/24 2015 01/23/24 2327 01/24/24 0537 01/24/24 1058  BP: (!) 127/91 (!) 127/91 119/82 (!) 124/104  Pulse: 83 88 74 89  Resp: 19 16 18 18   Temp: 98 F (36.7 C) 98 F (36.7 C) 98 F (36.7 C) 98.2 F (36.8 C)  TempSrc: Oral Oral Oral Oral  SpO2: 99% 95% 98% 98%  Weight:   76 kg   Height:       Body mass index is 28.77 kg/m.  Physical Exam Constitutional:      General: He is not in acute distress.    Appearance: He is well-developed.  Cardiovascular:     Rate and Rhythm: Normal rate and regular rhythm.     Heart sounds: Normal heart sounds.  Pulmonary:     Effort: Pulmonary effort is normal.     Breath sounds: Normal breath sounds.  Skin:    General: Skin is warm and dry.  Neurological:     Mental Status: He is alert and oriented to person, place, and time.  Psychiatric:        Mood and Affect: Mood  normal.     Lab Results Lab Results  Component Value Date   WBC 9.6 01/23/2024   HGB 11.2 (L) 01/23/2024   HCT 33.4 (L) 01/23/2024   MCV 91.3 01/23/2024   PLT 143 (L) 01/23/2024    Lab Results  Component Value Date   CREATININE 1.06 01/23/2024   BUN <5 (L) 01/23/2024   NA 141 01/23/2024   K 3.7 01/23/2024   CL 113 (H) 01/23/2024   CO2 20 (L) 01/23/2024    Lab Results  Component Value Date   ALT 26 01/20/2024   AST 40 01/20/2024   GGT 283 (H) 04/28/2017   ALKPHOS 75 01/20/2024   BILITOT 0.7 01/20/2024     Microbiology: Recent Results (from the past 240 hours)  Culture, blood (routine x 2)     Status: None (Preliminary result)   Collection Time: 01/20/24  12:33 PM   Specimen: BLOOD  Result Value Ref Range Status   Specimen Description BLOOD SITE NOT SPECIFIED  Final   Special Requests   Final    BOTTLES DRAWN AEROBIC AND ANAEROBIC Blood Culture adequate volume   Culture   Final    NO GROWTH 4 DAYS Performed at [facility], [address], [place], [place] [address]    Report Status PENDING  Incomplete  Culture, blood (routine x 2)     Status: None (Preliminary result)   Collection Time: 01/20/24 12:37 PM   Specimen: BLOOD  Result Value Ref Range Status   Specimen Description BLOOD SITE NOT SPECIFIED  Final   Special Requests   Final    BOTTLES DRAWN AEROBIC AND ANAEROBIC Blood Culture adequate volume   Culture   Final    NO GROWTH 4 DAYS Performed at [facility], [address] [place], [place] [address]    Report Status PENDING  Incomplete  C Difficile Quick Screen w PCR reflex     Status: None   Collection Time: 01/20/24  1:59 PM   Specimen: STOOL  Result Value Ref Range Status   C Diff antigen NEGATIVE NEGATIVE Final   C Diff toxin NEGATIVE NEGATIVE Final   C Diff interpretation No C. difficile detected.  Final    Comment: Performed at [facility], [address], [place], [place] [address]  MRSA Next Gen by PCR, Nasal     Status: None   Collection Time: 01/20/24  9:26 PM   Specimen: Nasal Mucosa; Nasal Swab  Result Value Ref Range Status   MRSA by PCR Next Gen NOT DETECTED NOT DETECTED Final    Comment: (NOTE) The GeneXpert MRSA Assay (FDA approved for NASAL specimens only), is one component of a comprehensive MRSA colonization surveillance program. It is not intended to diagnose MRSA infection nor to guide or monitor treatment for MRSA infections. Test performance is not FDA approved in patients less than 76 years old. Performed at [facility], [address], [place], [place] [address]   Gastrointestinal Panel by PCR , Stool     Status: Abnormal   Collection Time: 01/21/24 10:15 AM    Specimen: Stool  Result Value Ref Range Status   Campylobacter species NOT DETECTED NOT DETECTED Final   Plesimonas shigelloides NOT DETECTED NOT DETECTED Final   Salmonella species NOT DETECTED NOT DETECTED Final   Yersinia enterocolitica NOT DETECTED NOT DETECTED Final   Vibrio species NOT DETECTED NOT DETECTED Final   Vibrio cholerae NOT DETECTED NOT DETECTED Final   Enteroaggregative E coli (  EAEC) NOT DETECTED NOT DETECTED Final   Enteropathogenic E coli (EPEC) NOT DETECTED NOT DETECTED Final   Enterotoxigenic E coli (ETEC) NOT DETECTED NOT DETECTED Final   Shiga like toxin producing E coli (STEC) NOT DETECTED NOT DETECTED Final   Shigella/Enteroinvasive E coli (EIEC) NOT DETECTED NOT DETECTED Final   Cryptosporidium DETECTED (A) NOT DETECTED Final   Cyclospora cayetanensis NOT DETECTED NOT DETECTED Final   Entamoeba histolytica NOT DETECTED NOT DETECTED Final   Giardia lamblia NOT DETECTED NOT DETECTED Final   Adenovirus F40/41 NOT DETECTED NOT DETECTED Final   Astrovirus NOT DETECTED NOT DETECTED Final   Norovirus GI/GII NOT DETECTED NOT DETECTED Final   Rotavirus A NOT DETECTED NOT DETECTED Final   Sapovirus (I, II, IV, and V) NOT DETECTED NOT DETECTED Final    Comment: Performed at [facility], [address], [place], [place] [address]     Cathlyn July, NP [facility]  01/24/2024  2:19 PM

## 2024-01-24 NOTE — Plan of Care (Signed)

## 2024-01-24 NOTE — Plan of Care (Signed)

## 2024-01-24 NOTE — Telephone Encounter (Signed)
 Pharmacy Patient Advocate Encounter  Insurance verification completed.    The patient is insured through [facility]. Patient has Medicare and is not eligible for a copay card, but may be able to apply for patient assistance or Medicare RX Payment Plan (Patient Must reach out to their plan, if eligible for payment plan), if available.    Ran test claim for Nitazoxanide  500mg  tabs and the current 14 day co-pay is $0.00.   This test claim was processed through [facility]- copay amounts may vary at other pharmacies due to pharmacy/plan contracts, or as the patient moves through the different stages of their insurance plan.

## 2024-01-24 NOTE — Progress Notes (Signed)
 PROGRESS NOTE    [name]  FMW:[id number] DOB: 1966-02-22 DOA: 01/20/2024 PCP: Campbell Reynolds, NP   Brief Narrative:  This 58 y/o male with PMH significant for well controlled HIV -recent CD4 count 719 and undetectable viral load. 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  Found to have Cryptosporidium positive gastroenteritis in the ICU, blood pressure has since resolved,  no longer requiring pressors and transitioned to medical floor, hospitalist team to pick up on 01/23/2024.   At this time patient appears to be improving, prior AKI likely in setting of profound dehydration and hypovolemia is essentially resolved,  He is now back to baseline.  Infectious disease following, appreciate insight recommendations, tentative plan for discharge in the next 24 to 48 hours pending antibiotics needs and infectious course.  He continues to have loose/soft stool but denies any overt diarrhea.  Nausea/ vomiting resolved.  Assessment & Plan:   Principal Problem:   Sepsis (HCC) Active Problems:   Diarrhea due to cryptosporidium (HCC)   Shock, likely multifactorial,  hypovolemic versus septic, POA: -Secondary to overt GI infection with profound dehydration. -Off Levophed  now > 48 hours -Continue IV fluids, increase p.o. intake as appropriate. - Sepsis criteria met in the setting of tachypnea, leukocytosis with notable source of infection requiring pressors meeting septic shock criteria. - Now sepsis physiology improving.   Acute Gastroenteritis , stool PCR positive for Cryptosporidium: - ID consulted, appreciate insight/recs - not considered immunocompromised and potentially to add anti microbial tx Nitazoxanide  on 8/25 (not available currently in pharmacy) . - Stool occult pos-  Monitor H&H - Continue Tincture of opium  for symptomatic relief.   Acute Kidney Injury  > Resolved. - Secondary to hypovolemia and ATN. - IV fluids.- Renal functions back to baseline.   Syncope, Secondary to low BP: H/o Systolic CHF s/p ICD/Pacemaker: Echo shows mildly decreased LV function 45 to 50% with global hypokinesis, he has outpatient follow-up with heart failure service.   NAGMA Secondary to diarrhea> Resolved. Avoid hyperchloremic fluids. Hypokalemia and hypophosphatemia repleted -expect to improve with diet advancement.   Well controlled HIV, chronic: Unlikely to be HIV related since his Viral load is undetectable and his CD4 is over 700 Continue home HIV meds Odefsey  and Tivicay  .   H/o Hep C: Has been treated with antiretrovirals. Monitor liver functions.   H/o Neuropathy Resume PO meds when able to take PO.   DVT prophylaxis: Heparin  Code Status: Full code Family Communication: No family at bed side Disposition Plan: Status is: Inpatient Remains inpatient appropriate because:    Severity of illness  Consultants:  PCCM Infectious Diseases  Procedures:  Antimicrobials: Anti-infectives (From admission, onward)    Start     Dose/Rate Route Frequency Ordered Stop   01/24/24 1045  nitazoxanide  (ALINIA ) tablet 500 mg        500 mg Oral Every 12 hours 01/24/24 0946     01/21/24 1500  dolutegravir  (TIVICAY ) tablet 50 mg        50 mg Oral Daily 01/21/24 1408     01/21/24 1415  emtricitabine -rilpivir-tenofovir  AF (ODEFSEY ) 200-25-25 MG per tablet 1 tablet        1 tablet Oral Daily with breakfast 01/21/24 1408     01/20/24 1930  vancomycin  (VANCOREADY) IVPB 1500 mg/300 mL  1,500 mg 150 mL/hr over 120 Minutes Intravenous  Once 01/20/24 1923 01/20/24 2158   01/20/24 1415  ceFEPIme  (MAXIPIME ) 2 g in sodium chloride  0.9 % 100 mL IVPB        2 g 200 mL/hr over 30 Minutes Intravenous  Once 01/20/24 1404 01/20/24 1516   01/20/24 1415  vancomycin  (VANCOREADY)  IVPB 1500 mg/300 mL  Status:  Discontinued        1,500 mg 150 mL/hr over 120 Minutes Intravenous  Once 01/20/24 1404 01/20/24 1923   01/20/24 1345  metroNIDAZOLE  (FLAGYL ) IVPB 500 mg        500 mg 100 mL/hr over 60 Minutes Intravenous  Once 01/20/24 1339 01/20/24 1613      Subjective: Patient was seen and examined at bedside.  Overnight events noted. Patient still complains of having abdominal pain associated with diarrhea. Patient remains in contact isolation.   Objective: Vitals:   01/23/24 1629 01/23/24 2015 01/23/24 2327 01/24/24 0537  BP: 101/81 (!) 127/91 (!) 127/91 119/82  Pulse: 73 83 88 74  Resp: 18 19 16 18   Temp: 98.4 F (36.9 C) 98 F (36.7 C) 98 F (36.7 C) 98 F (36.7 C)  TempSrc: Oral Oral Oral Oral  SpO2: 99% 99% 95% 98%  Weight:    76 kg  Height:        Intake/Output Summary (Last 24 hours) at 01/24/2024 1205 Last data filed at 01/23/2024 2015 Gross per 24 hour  Intake --  Output 300 ml  Net -300 ml   Filed Weights   01/22/24 0307 01/23/24 0600 01/24/24 0537  Weight: 78.6 kg 76.1 kg 76 kg    Examination:  General exam: Appears calm and comfortable, not in any acute distress. Respiratory system: Clear to auscultation. Respiratory effort normal.  RR 14 Cardiovascular system: S1 & S2 heard, RRR. No JVD, murmurs, rubs, gallops or clicks.  Gastrointestinal system: Abdomen is non distended, soft and  Mildly tender.  Normal bowel sounds heard. Central nervous system: Alert and oriented x 3. No focal neurological deficits. Extremities: No edema, no cyanosis, no clubbing. Skin: No rashes, lesions or ulcers Psychiatry: Judgement and insight appear normal. Mood & affect appropriate.     Data Reviewed: I have personally reviewed following labs and imaging studies  CBC: Recent Labs  Lab 01/20/24 1226 01/20/24 1255 01/20/24 2245 01/21/24 0555 01/22/24 0226 01/23/24 0510  WBC 11.3*  --  14.1* 10.8* 8.8 9.6  NEUTROABS  --   --   --   --  5.4 5.3   HGB 15.2 17.0 13.0 12.8* 11.2* 11.2*  HCT 45.9 50.0 39.4 38.0* 32.9* 33.4*  MCV 93.3  --  93.4 91.8 90.4 91.3  PLT 205  --  169 158 137* 143*   Basic Metabolic Panel: Recent Labs  Lab 01/20/24 1222 01/20/24 1226 01/20/24 1226 01/20/24 1255 01/20/24 2245 01/21/24 0555 01/22/24 0226 01/23/24 0510  NA  --  135  --  133*  --  137 138 141  K  --  4.4  --  4.2  --  3.2* 3.5 3.7  CL  --  96*  --  106  --  113* 116* 113*  CO2  --  14*  --   --   --  13* 17* 20*  GLUCOSE  --  110*  --  103*  --  116* 89 88  BUN  --  46*  --  46*  --  30* 13 <5*  CREATININE  --  8.26*   < >  8.90* 6.28* 3.64* 1.42* 1.06  CALCIUM   --  9.4  --   --   --  7.9* 8.1* 8.1*  MG 2.8*  --   --   --   --  2.1 1.7 2.1  PHOS 8.9*  --   --   --   --  3.2 1.6* 2.5   < > = values in this interval not displayed.   GFR: Estimated Creatinine Clearance: 69.1 mL/min (by C-G formula based on SCr of 1.06 mg/dL). Liver Function Tests: Recent Labs  Lab 01/20/24 1226  AST 40  ALT 26  ALKPHOS 75  BILITOT 0.7  PROT 9.0*  ALBUMIN 4.5   Recent Labs  Lab 01/20/24 1226  LIPASE 53*   No results for input(s): AMMONIA in the last 168 hours. Coagulation Profile: Recent Labs  Lab 01/20/24 1255  INR 1.0   Cardiac Enzymes: No results for input(s): CKTOTAL, CKMB, CKMBINDEX, TROPONINI in the last 168 hours. BNP (last 3 results) No results for input(s): PROBNP in the last 8760 hours. HbA1C: No results for input(s): HGBA1C in the last 72 hours. CBG: Recent Labs  Lab 01/21/24 1106 01/21/24 1602 01/21/24 1941 01/21/24 2319 01/22/24 0339  GLUCAP 102* 93 87 95 87   Lipid Profile: No results for input(s): CHOL, HDL, LDLCALC, TRIG, CHOLHDL, LDLDIRECT in the last 72 hours. Thyroid  Function Tests: No results for input(s): TSH, T4TOTAL, FREET4, T3FREE, THYROIDAB in the last 72 hours. Anemia Panel: No results for input(s): VITAMINB12, FOLATE, FERRITIN, TIBC, IRON,  RETICCTPCT in the last 72 hours. Sepsis Labs: Recent Labs  Lab 01/20/24 1256  LATICACIDVEN 1.4    Recent Results (from the past 240 hours)  Culture, blood (routine x 2)     Status: None (Preliminary result)   Collection Time: 01/20/24 12:33 PM   Specimen: BLOOD  Result Value Ref Range Status   Specimen Description BLOOD SITE NOT SPECIFIED  Final   Special Requests   Final    BOTTLES DRAWN AEROBIC AND ANAEROBIC Blood Culture adequate volume   Culture   Final    NO GROWTH 4 DAYS Performed at [facility], [address] [place], [place] [address]    Report Status PENDING  Incomplete  Culture, blood (routine x 2)     Status: None (Preliminary result)   Collection Time: 01/20/24 12:37 PM   Specimen: BLOOD  Result Value Ref Range Status   Specimen Description BLOOD SITE NOT SPECIFIED  Final   Special Requests   Final    BOTTLES DRAWN AEROBIC AND ANAEROBIC Blood Culture adequate volume   Culture   Final    NO GROWTH 4 DAYS Performed at [facility], [address], [place], [place] [address]    Report Status PENDING  Incomplete  C Difficile Quick Screen w PCR reflex     Status: None   Collection Time: 01/20/24  1:59 PM   Specimen: STOOL  Result Value Ref Range Status   C Diff antigen NEGATIVE NEGATIVE Final   C Diff toxin NEGATIVE NEGATIVE Final   C Diff interpretation No C. difficile detected.  Final    Comment: Performed at [facility], [address], [place], [place] [address]  MRSA Next Gen by PCR, Nasal     Status: None   Collection Time: 01/20/24  9:26 PM   Specimen: Nasal Mucosa; Nasal Swab  Result Value Ref Range Status   MRSA by PCR Next Gen NOT DETECTED NOT DETECTED Final  Comment: (NOTE) The GeneXpert MRSA Assay (FDA approved for NASAL specimens only), is one component of a comprehensive MRSA colonization surveillance program. It is not intended to diagnose MRSA infection nor to guide or monitor treatment for MRSA infections. Test  performance is not FDA approved in patients less than 27 years old. Performed at [facility], [address] [place], [place] [address]   Gastrointestinal Panel by PCR , Stool     Status: Abnormal   Collection Time: 01/21/24 10:15 AM   Specimen: Stool  Result Value Ref Range Status   Campylobacter species NOT DETECTED NOT DETECTED Final   Plesimonas shigelloides NOT DETECTED NOT DETECTED Final   Salmonella species NOT DETECTED NOT DETECTED Final   Yersinia enterocolitica NOT DETECTED NOT DETECTED Final   Vibrio species NOT DETECTED NOT DETECTED Final   Vibrio cholerae NOT DETECTED NOT DETECTED Final   Enteroaggregative E coli (EAEC) NOT DETECTED NOT DETECTED Final   Enteropathogenic E coli (EPEC) NOT DETECTED NOT DETECTED Final   Enterotoxigenic E coli (ETEC) NOT DETECTED NOT DETECTED Final   Shiga like toxin producing E coli (STEC) NOT DETECTED NOT DETECTED Final   Shigella/Enteroinvasive E coli (EIEC) NOT DETECTED NOT DETECTED Final   Cryptosporidium DETECTED (A) NOT DETECTED Final   Cyclospora cayetanensis NOT DETECTED NOT DETECTED Final   Entamoeba histolytica NOT DETECTED NOT DETECTED Final   Giardia lamblia NOT DETECTED NOT DETECTED Final   Adenovirus F40/41 NOT DETECTED NOT DETECTED Final   Astrovirus NOT DETECTED NOT DETECTED Final   Norovirus GI/GII NOT DETECTED NOT DETECTED Final   Rotavirus A NOT DETECTED NOT DETECTED Final   Sapovirus (I, II, IV, and V) NOT DETECTED NOT DETECTED Final    Comment: Performed at [facility], [address], [place], [place] [address]   Radiology Studies: No results found.  Scheduled Meds:  allopurinol   100 mg Oral BID   amiodarone   100 mg Oral Daily   colchicine   0.6 mg Oral Daily   dolutegravir   50 mg Oral Daily   emtricitabine -rilpivir-tenofovir  AF  1 tablet Oral Q breakfast   gabapentin   300 mg Oral BID   heparin   5,000 Units Subcutaneous Q8H   nitazoxanide   500 mg Oral Q12H   Opium   6 mg Oral QID    rosuvastatin   10 mg Oral Daily   traZODone   50 mg Oral QHS   Continuous Infusions:  famotidine  (PEPCID ) IV 20 mg (01/23/24 2054)     LOS: 4 days    Time spent: 50 mins    Darcel Dawley, MD [facility]   If 7PM-7AM, please contact night-coverage

## 2024-01-25 ENCOUNTER — Other Ambulatory Visit (HOSPITAL_COMMUNITY): Payer: Self-pay

## 2024-01-25 DIAGNOSIS — R6521 Severe sepsis with septic shock: Secondary | ICD-10-CM | POA: Diagnosis not present

## 2024-01-25 DIAGNOSIS — G9341 Metabolic encephalopathy: Secondary | ICD-10-CM | POA: Diagnosis not present

## 2024-01-25 DIAGNOSIS — R197 Diarrhea, unspecified: Secondary | ICD-10-CM

## 2024-01-25 DIAGNOSIS — A419 Sepsis, unspecified organism: Secondary | ICD-10-CM | POA: Diagnosis not present

## 2024-01-25 LAB — CULTURE, BLOOD (ROUTINE X 2)
Culture: NO GROWTH
Culture: NO GROWTH
Special Requests: ADEQUATE
Special Requests: ADEQUATE

## 2024-01-25 MED ORDER — NITAZOXANIDE 500 MG PO TABS
500.0000 mg | ORAL_TABLET | Freq: Two times a day (BID) | ORAL | 0 refills | Status: AC
  Filled 2024-01-25: qty 24, 12d supply, fill #0

## 2024-01-25 MED ORDER — SODIUM CHLORIDE 0.9 % IV SOLN: INTRAVENOUS | Status: AC | PRN

## 2024-01-25 NOTE — Plan of Care (Signed)
  Problem: Clinical Measurements: Goal: Ability to maintain clinical measurements within normal limits will improve Outcome: Progressing Goal: Respiratory complications will improve Outcome: Progressing Goal: Cardiovascular complication will be avoided Outcome: Progressing   Problem: Nutrition: Goal: Adequate nutrition will be maintained Outcome: Progressing   

## 2024-01-25 NOTE — Progress Notes (Signed)
   01/24/24 2153  What Happened  Was fall witnessed? No  Was patient injured? No  Patient found on floor  Found by Staff-comment  Stated prior activity ambulating-unassisted  Provider Notification  Provider Name/Title Dr. JAYSON Piety  Date Provider Notified 01/24/24  Time Provider Notified 2200  Method of Notification Page;Call  Notification Reason Fall  Provider response No new orders  Follow Up  Family notified No - patient refusal  Additional tests No  Progress note created (see row info) Yes  Adult Fall Risk Assessment  Risk Factor Category (scoring not indicated) Fall has occurred during this admission (document High fall risk)  Age 58  Fall History: Fall within 6 months prior to admission 0  Elimination; Bowel and/or Urine Incontinence 0  Elimination; Bowel and/or Urine Urgency/Frequency 2  Medications: includes PCA/Opiates, Anti-convulsants, Anti-hypertensives, Diuretics, Hypnotics, Laxatives, Sedatives, and Psychotropics 3  Patient Care Equipment 1  Mobility-Assistance 0  Mobility-Gait 0  Mobility-Sensory Deficit 0  Altered awareness of immediate physical environment 0  Impulsiveness 0  Lack of understanding of one's physical/cognitive limitations 0  Total Score 7  Patient Fall Risk Level High fall risk  Adult Fall Risk Interventions  Required Bundle Interventions *See Row Information* High fall risk - low, moderate, and high requirements implemented  Additional Interventions Use of appropriate toileting equipment (bedpan, BSC, etc.)  Fall intervention(s) refused/Patient educated regarding refusal Supervision while toileting/edge of bed sitting  Screening for Fall Injury Risk (To be completed on HIGH fall risk patients) - Assessing Need for Floor Mats  Risk For Fall Injury- Criteria for Floor Mats Previous fall this admission  Will Implement Floor Mats Yes  Vitals  BP (!) 124/94  MAP (mmHg) 105  Pulse Rate 87  ECG Heart Rate 89  Oxygen Therapy  SpO2 98 %  O2  Device Room Air  Patient Activity (if Appropriate) In bed  Pulse Oximetry Type Continuous  Pain Assessment  Pain Scale 0-10  Pain Score 0  Neurological  Neuro (WDL) WDL  Level of Consciousness Alert  Glasgow Coma Scale  Eye Opening 4  Best Verbal Response (NON-intubated) 5  Best Motor Response 6  Glasgow Coma Scale Score 15  Musculoskeletal  Musculoskeletal (WDL) WDL  Assistive Device BSC  Integumentary  Integumentary (WDL) WDL  Skin Color Appropriate for ethnicity  Skin Condition Dry  Skin Integrity Intact  Ecchymosis Location Back  Ecchymosis Location Orientation Lower  Skin Turgor Non-tenting  Pain Assessment  Result of Injury No  Pain Assessment  Work-Related Injury No

## 2024-01-25 NOTE — Plan of Care (Signed)

## 2024-01-25 NOTE — Progress Notes (Signed)
 PROGRESS NOTE    KERI TAVELLA  FMW:[id number] DOB: [birth year] DOA: 01/20/2024 PCP: Campbell Reynolds, NP   Brief Narrative:  This 58 y/o male with PMH significant for well controlled HIV -recent CD4 count 719 and undetectable viral load. 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  Found to have Cryptosporidium positive gastroenteritis in the ICU, blood pressure has since resolved,  no longer requiring pressors and transitioned to medical floor, hospitalist team to pick up on 01/23/2024.   At this time patient appears to be improving, prior AKI likely in setting of profound dehydration and hypovolemia is essentially resolved,  He is now back to baseline.  Infectious disease following, appreciate insight recommendations, tentative plan for discharge in the next 24 to 48 hours pending antibiotics needs and infectious course.  He continues to have loose/soft stool but denies any overt diarrhea.  Nausea/ vomiting resolved.  Assessment & Plan:   Principal Problem:   Sepsis (HCC) Active Problems:   HIV disease (HCC)   Diarrhea due to cryptosporidium (HCC)   Diarrhea   Shock, likely multifactorial,  hypovolemic versus septic, POA: -Secondary to overt GI infection with profound dehydration. -Off Levophed  now > 48 hours -Continue IV fluids, increase p.o. intake as appropriate. -Sepsis criteria met in the setting of tachypnea, leukocytosis with notable source of infection requiring pressors meeting septic shock criteria. - Now sepsis physiology improving.   Acute Gastroenteritis , stool PCR positive for Cryptosporidium: - ID consulted, appreciate insight/recs - not considered immunocompromised. - Plan 2 weeks of nitazoxanide .  He is approved and insurance covered. - Stool occult pos-  Monitor H&H - Continue Tincture of opium  for symptomatic relief.   Acute Kidney Injury  > Resolved. - Secondary to hypovolemia and ATN. - IV fluids.- Renal functions back to baseline.   Syncope, Secondary to low BP: H/o Systolic CHF s/p ICD/Pacemaker: Echo shows mildly decreased LV function 45 to 50% with global hypokinesis, he has outpatient follow-up with heart failure service.   NAGMA Secondary to diarrhea> Resolved. Avoid hyperchloremic fluids. Hypokalemia and hypophosphatemia repleted -expect to improve with diet advancement.   Well controlled HIV, chronic: Unlikely to be HIV related since his Viral load is undetectable and his CD4 is over 700 Continue home HIV meds Odefsey  and Tivicay  .   H/o Hep C: Has been treated with antiretrovirals. Monitor liver functions.   H/o Neuropathy Resume PO meds when able to take PO.   DVT prophylaxis: Heparin  Code Status: Full code Family Communication: No family at bed side Disposition Plan: Status is: Inpatient Remains inpatient appropriate because:    Severity of illness  Consultants:  PCCM Infectious Diseases  Procedures:  Antimicrobials: Anti-infectives (From admission, onward)    Start     Dose/Rate Route Frequency Ordered Stop   01/25/24 0000  nitazoxanide  (ALINIA ) 500 MG tablet        500 mg Oral Every 12 hours 01/25/24 0925 02/06/24 2359   01/24/24 1045  nitazoxanide  (ALINIA ) tablet 500 mg        500 mg Oral Every 12 hours 01/24/24 0946     01/21/24 1500  dolutegravir  (TIVICAY ) tablet 50 mg        50 mg Oral Daily 01/21/24 1408     01/21/24 1415  emtricitabine -rilpivir-tenofovir  AF (ODEFSEY ) 200-25-25 MG per tablet 1 tablet  1 tablet Oral Daily with breakfast 01/21/24 1408     01/20/24 1930  vancomycin  (VANCOREADY) IVPB 1500 mg/300 mL        1,500 mg 150 mL/hr over 120 Minutes Intravenous  Once 01/20/24 1923 01/20/24 2158   01/20/24 1415  ceFEPIme  (MAXIPIME ) 2 g in sodium chloride  0.9 % 100 mL IVPB        2  g 200 mL/hr over 30 Minutes Intravenous  Once 01/20/24 1404 01/20/24 1516   01/20/24 1415  vancomycin  (VANCOREADY) IVPB 1500 mg/300 mL  Status:  Discontinued        1,500 mg 150 mL/hr over 120 Minutes Intravenous  Once 01/20/24 1404 01/20/24 1923   01/20/24 1345  metroNIDAZOLE  (FLAGYL ) IVPB 500 mg        500 mg 100 mL/hr over 60 Minutes Intravenous  Once 01/20/24 1339 01/20/24 1613      Subjective: Patient was seen and examined at bedside. Overnight events noted. Patient still reports having persistent diarrhea with some abdominal discomfort. Patient remains in contact isolation.   Objective: Vitals:   01/25/24 0012 01/25/24 0343 01/25/24 0731 01/25/24 0826  BP: 114/86 124/87 (!) 126/102 (!) 133/92  Pulse:  84 82 70  Resp:   19   Temp: 97.6 F (36.4 C) 98.6 F (37 C) 98.7 F (37.1 C)   TempSrc: Oral Oral Oral   SpO2:  95% 99% 99%  Weight:      Height:        Intake/Output Summary (Last 24 hours) at 01/25/2024 1200 Last data filed at 01/25/2024 1042 Gross per 24 hour  Intake 150 ml  Output 300 ml  Net -150 ml   Filed Weights   01/22/24 0307 01/23/24 0600 01/24/24 0537  Weight: 78.6 kg 76.1 kg 76 kg    Examination:  General exam: Appears calm and comfortable, not in any acute distress. Respiratory system: CTA Bilaterally . Respiratory effort normal.  RR 14 Cardiovascular system: S1 & S2 heard, RRR. No JVD, murmurs, rubs, gallops or clicks.  Gastrointestinal system: Abdomen is non distended, soft and  Mildly tender.  Normal bowel sounds heard. Central nervous system: Alert and oriented x 3. No focal neurological deficits. Extremities: No edema, no cyanosis, no clubbing. Skin: No rashes, lesions or ulcers Psychiatry: Judgement and insight appear normal. Mood & affect appropriate.    Data Reviewed: I have personally reviewed following labs and imaging studies  CBC: Recent Labs  Lab 01/20/24 1226 01/20/24 1255 01/20/24 2245 01/21/24 0555 01/22/24 0226  01/23/24 0510  WBC 11.3*  --  14.1* 10.8* 8.8 9.6  NEUTROABS  --   --   --   --  5.4 5.3  HGB 15.2 17.0 13.0 12.8* 11.2* 11.2*  HCT 45.9 50.0 39.4 38.0* 32.9* 33.4*  MCV 93.3  --  93.4 91.8 90.4 91.3  PLT 205  --  169 158 137* 143*   Basic Metabolic Panel: Recent Labs  Lab 01/20/24 1222 01/20/24 1226 01/20/24 1226 01/20/24 1255 01/20/24 2245 01/21/24 0555 01/22/24 0226 01/23/24 0510  NA  --  135  --  133*  --  137 138 141  K  --  4.4  --  4.2  --  3.2* 3.5 3.7  CL  --  96*  --  106  --  113* 116* 113*  CO2  --  14*  --   --   --  13* 17* 20*  GLUCOSE  --  110*  --  103*  --  116* 89 88  BUN  --  46*  --  46*  --  30* 13 <5*  CREATININE  --  8.26*   < > 8.90* 6.28* 3.64* 1.42* 1.06  CALCIUM   --  9.4  --   --   --  7.9* 8.1* 8.1*  MG 2.8*  --   --   --   --  2.1 1.7 2.1  PHOS 8.9*  --   --   --   --  3.2 1.6* 2.5   < > = values in this interval not displayed.   GFR: Estimated Creatinine Clearance: 69.1 mL/min (by C-G formula based on SCr of 1.06 mg/dL). Liver Function Tests: Recent Labs  Lab 01/20/24 1226  AST 40  ALT 26  ALKPHOS 75  BILITOT 0.7  PROT 9.0*  ALBUMIN 4.5   Recent Labs  Lab 01/20/24 1226  LIPASE 53*   No results for input(s): AMMONIA in the last 168 hours. Coagulation Profile: Recent Labs  Lab 01/20/24 1255  INR 1.0   Cardiac Enzymes: No results for input(s): CKTOTAL, CKMB, CKMBINDEX, TROPONINI in the last 168 hours. BNP (last 3 results) No results for input(s): PROBNP in the last 8760 hours. HbA1C: No results for input(s): HGBA1C in the last 72 hours. CBG: Recent Labs  Lab 01/21/24 1106 01/21/24 1602 01/21/24 1941 01/21/24 2319 01/22/24 0339  GLUCAP 102* 93 87 95 87   Lipid Profile: No results for input(s): CHOL, HDL, LDLCALC, TRIG, CHOLHDL, LDLDIRECT in the last 72 hours. Thyroid  Function Tests: No results for input(s): TSH, T4TOTAL, FREET4, T3FREE, THYROIDAB in the last 72 hours. Anemia  Panel: No results for input(s): VITAMINB12, FOLATE, FERRITIN, TIBC, IRON, RETICCTPCT in the last 72 hours. Sepsis Labs: Recent Labs  Lab 01/20/24 1256  LATICACIDVEN 1.4    Recent Results (from the past 240 hours)  Culture, blood (routine x 2)     Status: None   Collection Time: 01/20/24 12:33 PM   Specimen: BLOOD  Result Value Ref Range Status   Specimen Description BLOOD SITE NOT SPECIFIED  Final   Special Requests   Final    BOTTLES DRAWN AEROBIC AND ANAEROBIC Blood Culture adequate volume   Culture   Final    NO GROWTH 5 DAYS Performed at [facility], [address] [place], [place] [address]    Report Status 01/25/2024 FINAL  Final  Culture, blood (routine x 2)     Status: None   Collection Time: 01/20/24 12:37 PM   Specimen: BLOOD  Result Value Ref Range Status   Specimen Description BLOOD SITE NOT SPECIFIED  Final   Special Requests   Final    BOTTLES DRAWN AEROBIC AND ANAEROBIC Blood Culture adequate volume   Culture   Final    NO GROWTH 5 DAYS Performed at [facility], [address], [place], [place] [address]    Report Status 01/25/2024 FINAL  Final  C Difficile Quick Screen w PCR reflex     Status: None   Collection Time: 01/20/24  1:59 PM   Specimen: STOOL  Result Value Ref Range Status   C Diff antigen NEGATIVE NEGATIVE Final   C Diff toxin NEGATIVE NEGATIVE Final   C Diff interpretation No C. difficile detected.  Final    Comment: Performed at [facility], [address], [place], [place] [address]  MRSA Next Gen by PCR, Nasal     Status: None   Collection Time: 01/20/24  9:26 PM   Specimen: Nasal Mucosa;  Nasal Swab  Result Value Ref Range Status   MRSA by PCR Next Gen NOT DETECTED NOT DETECTED Final    Comment: (NOTE) The GeneXpert MRSA Assay (FDA approved for NASAL specimens only), is one component of a comprehensive MRSA colonization surveillance program. It is not intended to diagnose MRSA infection nor to guide or  monitor treatment for MRSA infections. Test performance is not FDA approved in patients less than 85 years old. Performed at [facility], [address], [place], [place] [address]   Gastrointestinal Panel by PCR , Stool     Status: Abnormal   Collection Time: 01/21/24 10:15 AM   Specimen: Stool  Result Value Ref Range Status   Campylobacter species NOT DETECTED NOT DETECTED Final   Plesimonas shigelloides NOT DETECTED NOT DETECTED Final   Salmonella species NOT DETECTED NOT DETECTED Final   Yersinia enterocolitica NOT DETECTED NOT DETECTED Final   Vibrio species NOT DETECTED NOT DETECTED Final   Vibrio cholerae NOT DETECTED NOT DETECTED Final   Enteroaggregative E coli (EAEC) NOT DETECTED NOT DETECTED Final   Enteropathogenic E coli (EPEC) NOT DETECTED NOT DETECTED Final   Enterotoxigenic E coli (ETEC) NOT DETECTED NOT DETECTED Final   Shiga like toxin producing E coli (STEC) NOT DETECTED NOT DETECTED Final   Shigella/Enteroinvasive E coli (EIEC) NOT DETECTED NOT DETECTED Final   Cryptosporidium DETECTED (A) NOT DETECTED Final   Cyclospora cayetanensis NOT DETECTED NOT DETECTED Final   Entamoeba histolytica NOT DETECTED NOT DETECTED Final   Giardia lamblia NOT DETECTED NOT DETECTED Final   Adenovirus F40/41 NOT DETECTED NOT DETECTED Final   Astrovirus NOT DETECTED NOT DETECTED Final   Norovirus GI/GII NOT DETECTED NOT DETECTED Final   Rotavirus A NOT DETECTED NOT DETECTED Final   Sapovirus (I, II, IV, and V) NOT DETECTED NOT DETECTED Final    Comment: Performed at [facility], [address] [place], [place] [address]   Radiology Studies: No results found.  Scheduled Meds:  allopurinol   100 mg Oral BID   amiodarone   100 mg Oral Daily   colchicine   0.6 mg Oral Daily   dolutegravir   50 mg Oral Daily   emtricitabine -rilpivir-tenofovir  AF  1 tablet Oral Q breakfast   gabapentin   300 mg Oral TID   heparin   5,000 Units Subcutaneous Q8H   nitazoxanide   500 mg  Oral Q12H   Opium   6 mg Oral QID   rosuvastatin   10 mg Oral Daily   traZODone   50 mg Oral QHS   Continuous Infusions:  famotidine  (PEPCID ) IV Stopped (01/24/24 2325)     LOS: 5 days    Time spent: 35 mins    Darcel Dawley, MD [facility]   If 7PM-7AM, please contact night-coverage

## 2024-01-25 NOTE — Progress Notes (Addendum)
 Pt called, told secretary that he was on the floor, secretary went inside to check for the pt. Pt was found sitting in the floor, alert and oriented. Charge nurse, NT and some other RNs was at bedside when this RN arrived inside the room. CN told this RN that pt said he was asking for pain medicine and none would come and went to the bedside commode right just beside the bed then fell. Assessed pt and no injury from the fall, not in pain on his back or bottom but just pain in the belly which is chronic for him this admission. Pain medicine given Called and informed MD Lenon.

## 2024-01-26 ENCOUNTER — Other Ambulatory Visit (HOSPITAL_COMMUNITY): Payer: Self-pay

## 2024-01-26 DIAGNOSIS — G9341 Metabolic encephalopathy: Secondary | ICD-10-CM | POA: Diagnosis not present

## 2024-01-26 DIAGNOSIS — A419 Sepsis, unspecified organism: Secondary | ICD-10-CM | POA: Diagnosis not present

## 2024-01-26 DIAGNOSIS — R6521 Severe sepsis with septic shock: Secondary | ICD-10-CM | POA: Diagnosis not present

## 2024-01-26 MED ORDER — ONDANSETRON HCL 4 MG PO TABS
4.0000 mg | ORAL_TABLET | Freq: Four times a day (QID) | ORAL | Status: DC | PRN
  Administered 2024-01-26 – 2024-01-28 (×3): 4 mg via ORAL
  Filled 2024-01-26 (×3): qty 1

## 2024-01-26 MED ORDER — LOPERAMIDE HCL 2 MG PO CAPS
2.0000 mg | ORAL_CAPSULE | ORAL | Status: DC | PRN
  Administered 2024-01-26 – 2024-01-27 (×4): 2 mg via ORAL
  Filled 2024-01-26 (×4): qty 1

## 2024-01-26 NOTE — Progress Notes (Signed)
 PROGRESS NOTE    [name]  FMW:[id number] DOB: 04-29-1966 DOA: 01/20/2024 PCP: Campbell Reynolds, NP   Brief Narrative:  This 58 y/o male with PMH significant for well controlled HIV -recent CD4 count 719 and undetectable viral load. 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  Found to have Cryptosporidium positive gastroenteritis in the ICU, blood pressure has since resolved,  no longer requiring pressors and transitioned to medical floor, hospitalist team to pick up on 01/23/2024.   At this time patient appears to be improving, prior AKI likely in setting of profound dehydration and hypovolemia is essentially resolved,  He is now back to baseline.  Infectious disease following, appreciate insight recommendations, tentative plan for discharge in the next 24 to 48 hours pending antibiotics needs and infectious course.  He continues to have loose/soft stool but denies any overt diarrhea.  Nausea/ vomiting resolved.  Assessment & Plan:   Principal Problem:   Sepsis (HCC) Active Problems:   HIV disease (HCC)   Diarrhea due to cryptosporidium (HCC)   Diarrhea   Shock, likely multifactorial,  hypovolemic versus septic, POA: -Secondary to overt GI infection with profound dehydration. -Off Levophed  now > 48 hours -Continue IV fluids, increase p.o. intake as appropriate. -Sepsis criteria met in the setting of tachypnea, leukocytosis with notable source of infection requiring pressors meeting septic shock criteria. -Now sepsis physiology improving.   Acute Gastroenteritis , stool PCR positive for Cryptosporidium: - ID consulted, appreciate insight/recs - not considered immunocompromised. - Plan 2 weeks of nitazoxanide .  He is approved and insurance covered. - Stool occult pos-  Monitor H&H - Continue Tincture of opium  for symptomatic relief. - Patient continued to have diarrhea, started on imodium     Acute Kidney Injury  > Resolved. - Secondary to hypovolemia and ATN. - IV fluids.- Renal functions back to baseline.   Syncope, Secondary to low BP: H/o Systolic CHF s/p ICD/Pacemaker: Echo shows mildly decreased LV function 45 to 50% with global hypokinesis, he has outpatient follow-up with heart failure service.   NAGMA Secondary to diarrhea> Resolved. Avoid hyperchloremic fluids. Hypokalemia and hypophosphatemia repleted -expect to improve with diet advancement.   Well controlled HIV, chronic: Unlikely to be HIV related since his Viral load is undetectable and his CD4 is over 700 Continue home HIV meds Odefsey  and Tivicay  .   H/o Hep C: Has been treated with antiretrovirals. Monitor liver functions.   H/o Neuropathy Resume PO meds when able to take PO.   DVT prophylaxis: Heparin  Code Status: Full code Family Communication: No family at bed side. Disposition Plan: Status is: Inpatient Remains inpatient appropriate because:    Severity of illness  Consultants:  PCCM Infectious Diseases  Procedures:  Antimicrobials: Anti-infectives (From admission, onward)    Start     Dose/Rate Route Frequency Ordered Stop   01/25/24 0000  nitazoxanide  (ALINIA ) 500 MG tablet        500 mg Oral Every 12 hours 01/25/24 0925 02/07/24 2359   01/24/24 1045  nitazoxanide  (ALINIA ) tablet 500 mg        500 mg Oral Every 12 hours 01/24/24 0946     01/21/24 1500  dolutegravir  (TIVICAY ) tablet 50 mg        50 mg Oral Daily 01/21/24 1408     01/21/24 1415  emtricitabine -rilpivir-tenofovir  AF (  ODEFSEY ) 200-25-25 MG per tablet 1 tablet        1 tablet Oral Daily with breakfast 01/21/24 1408     01/20/24 1930  vancomycin  (VANCOREADY) IVPB 1500 mg/300 mL        1,500 mg 150 mL/hr over 120 Minutes Intravenous  Once 01/20/24 1923 01/20/24 2158   01/20/24 1415  ceFEPIme   (MAXIPIME ) 2 g in sodium chloride  0.9 % 100 mL IVPB        2 g 200 mL/hr over 30 Minutes Intravenous  Once 01/20/24 1404 01/20/24 1516   01/20/24 1415  vancomycin  (VANCOREADY) IVPB 1500 mg/300 mL  Status:  Discontinued        1,500 mg 150 mL/hr over 120 Minutes Intravenous  Once 01/20/24 1404 01/20/24 1923   01/20/24 1345  metroNIDAZOLE  (FLAGYL ) IVPB 500 mg        500 mg 100 mL/hr over 60 Minutes Intravenous  Once 01/20/24 1339 01/20/24 1613      Subjective: Patient was seen and examined at bedside. Overnight events noted. Patient still reports having persistent diarrhea with some abdominal discomfort. Patient remains in contact isolation.   Objective: Vitals:   01/26/24 0010 01/26/24 0537 01/26/24 0815 01/26/24 1100  BP: (!) 135/94 128/86 (!) 134/90 (!) 140/97  Pulse: 72 (!) 101 77 74  Resp: 17 16 15 17   Temp: 97.6 F (36.4 C) 97.7 F (36.5 C) 97.9 F (36.6 C) (!) 97.3 F (36.3 C)  TempSrc: Oral Oral Oral Oral  SpO2: 97% 98% 99% 96%  Weight:  76.2 kg    Height:        Intake/Output Summary (Last 24 hours) at 01/26/2024 1336 Last data filed at 01/26/2024 1200 Gross per 24 hour  Intake 791.73 ml  Output --  Net 791.73 ml   Filed Weights   01/23/24 0600 01/24/24 0537 01/26/24 0537  Weight: 76.1 kg 76 kg 76.2 kg    Examination:  General exam: Appears calm and comfortable, not in any acute distress. Respiratory system: CTA Bilaterally . Respiratory effort normal.  RR 16 Cardiovascular system: S1 & S2 heard, RRR. No JVD, murmurs, rubs, gallops or clicks.  Gastrointestinal system: Abdomen is non distended, soft and  Mildly tender.  Normal bowel sounds heard. Central nervous system: Alert and oriented x 3. No focal neurological deficits. Extremities: No edema, no cyanosis, no clubbing. Skin: No rashes, lesions or ulcers Psychiatry: Judgement and insight appear normal. Mood & affect appropriate.    Data Reviewed: I have personally reviewed following labs and imaging  studies  CBC: Recent Labs  Lab 01/20/24 1226 01/20/24 1255 01/20/24 2245 01/21/24 0555 01/22/24 0226 01/23/24 0510  WBC 11.3*  --  14.1* 10.8* 8.8 9.6  NEUTROABS  --   --   --   --  5.4 5.3  HGB 15.2 17.0 13.0 12.8* 11.2* 11.2*  HCT 45.9 50.0 39.4 38.0* 32.9* 33.4*  MCV 93.3  --  93.4 91.8 90.4 91.3  PLT 205  --  169 158 137* 143*   Basic Metabolic Panel: Recent Labs  Lab 01/20/24 1222 01/20/24 1226 01/20/24 1226 01/20/24 1255 01/20/24 2245 01/21/24 0555 01/22/24 0226 01/23/24 0510  NA  --  135  --  133*  --  137 138 141  K  --  4.4  --  4.2  --  3.2* 3.5 3.7  CL  --  96*  --  106  --  113* 116* 113*  CO2  --  14*  --   --   --  13* 17* 20*  GLUCOSE  --  110*  --  103*  --  116* 89 88  BUN  --  46*  --  46*  --  30* 13 <5*  CREATININE  --  8.26*   < > 8.90* 6.28* 3.64* 1.42* 1.06  CALCIUM   --  9.4  --   --   --  7.9* 8.1* 8.1*  MG 2.8*  --   --   --   --  2.1 1.7 2.1  PHOS 8.9*  --   --   --   --  3.2 1.6* 2.5   < > = values in this interval not displayed.   GFR: Estimated Creatinine Clearance: 69.2 mL/min (by C-G formula based on SCr of 1.06 mg/dL). Liver Function Tests: Recent Labs  Lab 01/20/24 1226  AST 40  ALT 26  ALKPHOS 75  BILITOT 0.7  PROT 9.0*  ALBUMIN 4.5   Recent Labs  Lab 01/20/24 1226  LIPASE 53*   No results for input(s): AMMONIA in the last 168 hours. Coagulation Profile: Recent Labs  Lab 01/20/24 1255  INR 1.0   Cardiac Enzymes: No results for input(s): CKTOTAL, CKMB, CKMBINDEX, TROPONINI in the last 168 hours. BNP (last 3 results) No results for input(s): PROBNP in the last 8760 hours. HbA1C: No results for input(s): HGBA1C in the last 72 hours. CBG: Recent Labs  Lab 01/21/24 1106 01/21/24 1602 01/21/24 1941 01/21/24 2319 01/22/24 0339  GLUCAP 102* 93 87 95 87   Lipid Profile: No results for input(s): CHOL, HDL, LDLCALC, TRIG, CHOLHDL, LDLDIRECT in the last 72 hours. Thyroid  Function  Tests: No results for input(s): TSH, T4TOTAL, FREET4, T3FREE, THYROIDAB in the last 72 hours. Anemia Panel: No results for input(s): VITAMINB12, FOLATE, FERRITIN, TIBC, IRON, RETICCTPCT in the last 72 hours. Sepsis Labs: Recent Labs  Lab 01/20/24 1256  LATICACIDVEN 1.4    Recent Results (from the past 240 hours)  Culture, blood (routine x 2)     Status: None   Collection Time: 01/20/24 12:33 PM   Specimen: BLOOD  Result Value Ref Range Status   Specimen Description BLOOD SITE NOT SPECIFIED  Final   Special Requests   Final    BOTTLES DRAWN AEROBIC AND ANAEROBIC Blood Culture adequate volume   Culture   Final    NO GROWTH 5 DAYS Performed at [facility], [address], [place], [place] [address]    Report Status 01/25/2024 FINAL  Final  Culture, blood (routine x 2)     Status: None   Collection Time: 01/20/24 12:37 PM   Specimen: BLOOD  Result Value Ref Range Status   Specimen Description BLOOD SITE NOT SPECIFIED  Final   Special Requests   Final    BOTTLES DRAWN AEROBIC AND ANAEROBIC Blood Culture adequate volume   Culture   Final    NO GROWTH 5 DAYS Performed at [facility], [address] [place], [place] [address]    Report Status 01/25/2024 FINAL  Final  C Difficile Quick Screen w PCR reflex     Status: None   Collection Time: 01/20/24  1:59 PM   Specimen: STOOL  Result Value Ref Range Status   C Diff antigen NEGATIVE NEGATIVE Final   C Diff toxin NEGATIVE NEGATIVE Final   C Diff interpretation No C. difficile detected.  Final    Comment: Performed at [facility], [address] [place], [place] [address]  MRSA Next Gen by PCR,  Nasal     Status: None   Collection Time: 01/20/24  9:26 PM   Specimen: Nasal Mucosa; Nasal Swab  Result Value Ref Range Status   MRSA by PCR Next Gen NOT DETECTED NOT DETECTED Final    Comment: (NOTE) The GeneXpert MRSA Assay (FDA approved for NASAL specimens only), is one component of a  comprehensive MRSA colonization surveillance program. It is not intended to diagnose MRSA infection nor to guide or monitor treatment for MRSA infections. Test performance is not FDA approved in patients less than 85 years old. Performed at [facility], [address], [place], [place] [address]   Gastrointestinal Panel by PCR , Stool     Status: Abnormal   Collection Time: 01/21/24 10:15 AM   Specimen: Stool  Result Value Ref Range Status   Campylobacter species NOT DETECTED NOT DETECTED Final   Plesimonas shigelloides NOT DETECTED NOT DETECTED Final   Salmonella species NOT DETECTED NOT DETECTED Final   Yersinia enterocolitica NOT DETECTED NOT DETECTED Final   Vibrio species NOT DETECTED NOT DETECTED Final   Vibrio cholerae NOT DETECTED NOT DETECTED Final   Enteroaggregative E coli (EAEC) NOT DETECTED NOT DETECTED Final   Enteropathogenic E coli (EPEC) NOT DETECTED NOT DETECTED Final   Enterotoxigenic E coli (ETEC) NOT DETECTED NOT DETECTED Final   Shiga like toxin producing E coli (STEC) NOT DETECTED NOT DETECTED Final   Shigella/Enteroinvasive E coli (EIEC) NOT DETECTED NOT DETECTED Final   Cryptosporidium DETECTED (A) NOT DETECTED Final   Cyclospora cayetanensis NOT DETECTED NOT DETECTED Final   Entamoeba histolytica NOT DETECTED NOT DETECTED Final   Giardia lamblia NOT DETECTED NOT DETECTED Final   Adenovirus F40/41 NOT DETECTED NOT DETECTED Final   Astrovirus NOT DETECTED NOT DETECTED Final   Norovirus GI/GII NOT DETECTED NOT DETECTED Final   Rotavirus A NOT DETECTED NOT DETECTED Final   Sapovirus (I, II, IV, and V) NOT DETECTED NOT DETECTED Final    Comment: Performed at [facility], [address], [place], [place] [address]   Radiology Studies: No results found.  Scheduled Meds:  allopurinol   100 mg Oral BID   amiodarone   100 mg Oral Daily   colchicine   0.6 mg Oral Daily   dolutegravir   50 mg Oral Daily   emtricitabine -rilpivir-tenofovir  AF  1  tablet Oral Q breakfast   gabapentin   300 mg Oral TID   heparin   5,000 Units Subcutaneous Q8H   nitazoxanide   500 mg Oral Q12H   Opium   6 mg Oral QID   rosuvastatin   10 mg Oral Daily   traZODone   50 mg Oral QHS   Continuous Infusions:  sodium chloride  10 mL/hr at 01/25/24 2235   famotidine  (PEPCID ) IV 20 mg (01/25/24 2236)     LOS: 6 days    Time spent: 35 mins    Darcel Dawley, MD [facility]   If 7PM-7AM, please contact night-coverage

## 2024-01-26 NOTE — Progress Notes (Signed)
 [facility]  Date of Admission:  01/20/2024     Reason for Follow Up: Sepsis [facility])  Total days of antibiotics 4         ASSESSMENT:  [name] is a 58 y/o AA male with well controlled HIV disease presenting with profuse watery diarrhea with associated nausea/vomiting and fevers and found to have Cryptosporidium diarrhea.  Started on nitazoxanide .   [name] continues to have multiple and frequent bowel movements per day indicating 10 yesterday and at least 5 so far today.  Discussed plan of care to continue current dose of nitazoxanide . Optimize antidiarrheal medications with loperamide  and tincture of opium  as needed.  Anticipate slow improvement in symptoms over time.  In the interim we will need to continue with electrolyte monitoring and rehydration.  Encouraged to avoid products containing lactose which may be contributing to the problem.  Otherwise, continue current dose of Tivicay  and Odefsey .  Standard/universal precautions.  Remaining medical and supportive care per internal medicine.  PLAN:  Continue current dose of nitazoxanide  Optimize antidiarrheals with loperamide  and tincture of opium . Encourage good hydration and electrolyte replacement as needed. Avoid dairy products. Continue current dose of Tivicay  and Odefsey . Standard/universal precautions. Remaining medical and supportive care per internal medicine.  Principal Problem:   Sepsis (HCC) Active Problems:   HIV disease (HCC)   Diarrhea due to cryptosporidium (HCC)   Diarrhea    allopurinol   100 mg Oral BID   amiodarone   100 mg Oral Daily   colchicine   0.6 mg Oral Daily   dolutegravir   50 mg Oral Daily   emtricitabine -rilpivir-tenofovir  AF  1 tablet Oral Q breakfast   gabapentin   300 mg Oral TID   heparin   5,000 Units Subcutaneous Q8H   nitazoxanide   500 mg Oral Q12H   Opium   6 mg Oral QID   rosuvastatin   10 mg Oral Daily   traZODone   50 mg Oral QHS     SUBJECTIVE:  Continues to have multiple frequent loose bowel movements per day to 10 yesterday and so far 5 today.  Tolerating medications with no adverse side effects.  Denies fevers, chills, or night sweats.  Allergies  Allergen Reactions   Bactrim Rash   Sulfamethoxazole -Trimethoprim Rash     Review of Systems: Review of Systems  Constitutional:  Negative for chills, fever and weight loss.  Respiratory:  Negative for cough, shortness of breath and wheezing.   Cardiovascular:  Negative for chest pain and leg swelling.  Gastrointestinal:  Positive for diarrhea. Negative for abdominal pain, constipation, nausea and vomiting.  Skin:  Negative for rash.      OBJECTIVE: Vitals:   01/26/24 0010 01/26/24 0537 01/26/24 0815 01/26/24 1100  BP: (!) 135/94 128/86 (!) 134/90 (!) 140/97  Pulse: 72 (!) 101 77 74  Resp: 17 16 15 17   Temp: 97.6 F (36.4 C) 97.7 F (36.5 C) 97.9 F (36.6 C) (!) 97.3 F (36.3 C)  TempSrc: Oral Oral Oral Oral  SpO2: 97% 98% 99% 96%  Weight:  76.2 kg    Height:       Body mass index is 28.84 kg/m.  Physical Exam Constitutional:      General: He is not in acute distress.    Appearance: He is well-developed.  Cardiovascular:     Rate and Rhythm: Normal rate and regular rhythm.     Heart sounds: Normal heart sounds.  Pulmonary:     Effort: Pulmonary effort is normal.  Breath sounds: Normal breath sounds.  Skin:    General: Skin is warm and dry.  Neurological:     Mental Status: He is alert.     Lab Results Lab Results  Component Value Date   WBC 9.6 01/23/2024   HGB 11.2 (L) 01/23/2024   HCT 33.4 (L) 01/23/2024   MCV 91.3 01/23/2024   PLT 143 (L) 01/23/2024    Lab Results  Component Value Date   CREATININE 1.06 01/23/2024   BUN <5 (L) 01/23/2024   NA 141 01/23/2024   K 3.7 01/23/2024   CL 113 (H) 01/23/2024   CO2 20 (L) 01/23/2024    Lab Results  Component Value Date   ALT 26 01/20/2024   AST 40 01/20/2024   GGT 283  (H) 04/28/2017   ALKPHOS 75 01/20/2024   BILITOT 0.7 01/20/2024     Microbiology: Recent Results (from the past 240 hours)  Culture, blood (routine x 2)     Status: None   Collection Time: 01/20/24 12:33 PM   Specimen: BLOOD  Result Value Ref Range Status   Specimen Description BLOOD SITE NOT SPECIFIED  Final   Special Requests   Final    BOTTLES DRAWN AEROBIC AND ANAEROBIC Blood Culture adequate volume   Culture   Final    NO GROWTH 5 DAYS Performed at [facility], [address], [place], [place] [address]    Report Status 01/25/2024 FINAL  Final  Culture, blood (routine x 2)     Status: None   Collection Time: 01/20/24 12:37 PM   Specimen: BLOOD  Result Value Ref Range Status   Specimen Description BLOOD SITE NOT SPECIFIED  Final   Special Requests   Final    BOTTLES DRAWN AEROBIC AND ANAEROBIC Blood Culture adequate volume   Culture   Final    NO GROWTH 5 DAYS Performed at [facility], [address], [place], [place] [address]    Report Status 01/25/2024 FINAL  Final  C Difficile Quick Screen w PCR reflex     Status: None   Collection Time: 01/20/24  1:59 PM   Specimen: STOOL  Result Value Ref Range Status   C Diff antigen NEGATIVE NEGATIVE Final   C Diff toxin NEGATIVE NEGATIVE Final   C Diff interpretation No C. difficile detected.  Final    Comment: Performed at [facility], [address], [place], [place] [address]  MRSA Next Gen by PCR, Nasal     Status: None   Collection Time: 01/20/24  9:26 PM   Specimen: Nasal Mucosa; Nasal Swab  Result Value Ref Range Status   MRSA by PCR Next Gen NOT DETECTED NOT DETECTED Final    Comment: (NOTE) The GeneXpert MRSA Assay (FDA approved for NASAL specimens only), is one component of a comprehensive MRSA colonization surveillance program. It is not intended to diagnose MRSA infection nor to guide or monitor treatment for MRSA infections. Test performance is not FDA approved in patients less than 39  years old. Performed at [facility], [address], [place], [place] [address]   Gastrointestinal Panel by PCR , Stool     Status: Abnormal   Collection Time: 01/21/24 10:15 AM   Specimen: Stool  Result Value Ref Range Status   Campylobacter species NOT DETECTED NOT DETECTED Final   Plesimonas shigelloides NOT DETECTED NOT DETECTED Final   Salmonella species NOT DETECTED NOT DETECTED Final   Yersinia enterocolitica NOT DETECTED NOT DETECTED  Final   Vibrio species NOT DETECTED NOT DETECTED Final   Vibrio cholerae NOT DETECTED NOT DETECTED Final   Enteroaggregative E coli (EAEC) NOT DETECTED NOT DETECTED Final   Enteropathogenic E coli (EPEC) NOT DETECTED NOT DETECTED Final   Enterotoxigenic E coli (ETEC) NOT DETECTED NOT DETECTED Final   Shiga like toxin producing E coli (STEC) NOT DETECTED NOT DETECTED Final   Shigella/Enteroinvasive E coli (EIEC) NOT DETECTED NOT DETECTED Final   Cryptosporidium DETECTED (A) NOT DETECTED Final   Cyclospora cayetanensis NOT DETECTED NOT DETECTED Final   Entamoeba histolytica NOT DETECTED NOT DETECTED Final   Giardia lamblia NOT DETECTED NOT DETECTED Final   Adenovirus F40/41 NOT DETECTED NOT DETECTED Final   Astrovirus NOT DETECTED NOT DETECTED Final   Norovirus GI/GII NOT DETECTED NOT DETECTED Final   Rotavirus A NOT DETECTED NOT DETECTED Final   Sapovirus (I, II, IV, and V) NOT DETECTED NOT DETECTED Final    Comment: Performed at [facility], [address], [place], [place] [address]     Cathlyn July, NP [facility]  01/26/2024  3:04 PM

## 2024-01-27 ENCOUNTER — Other Ambulatory Visit: Payer: Self-pay

## 2024-01-27 ENCOUNTER — Other Ambulatory Visit (HOSPITAL_COMMUNITY): Payer: Self-pay

## 2024-01-27 DIAGNOSIS — A419 Sepsis, unspecified organism: Secondary | ICD-10-CM | POA: Diagnosis not present

## 2024-01-27 DIAGNOSIS — G9341 Metabolic encephalopathy: Secondary | ICD-10-CM | POA: Diagnosis not present

## 2024-01-27 DIAGNOSIS — R6521 Severe sepsis with septic shock: Secondary | ICD-10-CM | POA: Diagnosis not present

## 2024-01-27 LAB — COMPREHENSIVE METABOLIC PANEL WITH GFR
ALT: 33 U/L (ref 0–44)
AST: 53 U/L — ABNORMAL HIGH (ref 15–41)
Albumin: 2.8 g/dL — ABNORMAL LOW (ref 3.5–5.0)
Alkaline Phosphatase: 58 U/L (ref 38–126)
Anion gap: 11 (ref 5–15)
BUN: 5 mg/dL — ABNORMAL LOW (ref 6–20)
CO2: 17 mmol/L — ABNORMAL LOW (ref 22–32)
Calcium: 8.4 mg/dL — ABNORMAL LOW (ref 8.9–10.3)
Chloride: 112 mmol/L — ABNORMAL HIGH (ref 98–111)
Creatinine, Ser: 1.14 mg/dL (ref 0.61–1.24)
GFR, Estimated: >60 mL/min (ref >60)
Glucose, Bld: 83 mg/dL (ref 70–99)
Potassium: 4.2 mmol/L (ref 3.5–5.1)
Sodium: 140 mmol/L (ref 135–145)
Total Bilirubin: 0.7 mg/dL (ref 0.0–1.2)
Total Protein: 5.5 g/dL — ABNORMAL LOW (ref 6.5–8.1)

## 2024-01-27 LAB — MAGNESIUM: Magnesium: 1.6 mg/dL — ABNORMAL LOW (ref 1.7–2.4)

## 2024-01-27 LAB — PHOSPHORUS: Phosphorus: 3.2 mg/dL (ref 2.5–4.6)

## 2024-01-27 MED ORDER — SODIUM CHLORIDE 0.9 % IV SOLN: INTRAVENOUS | Status: AC | PRN

## 2024-01-27 MED ORDER — SACCHAROMYCES BOULARDII 250 MG PO CAPS
250.0000 mg | ORAL_CAPSULE | Freq: Two times a day (BID) | ORAL | Status: DC
  Administered 2024-01-27 – 2024-02-01 (×11): 250 mg via ORAL
  Filled 2024-01-27 (×12): qty 1

## 2024-01-27 MED ORDER — MAGNESIUM SULFATE 2 GM/50ML IV SOLN
2.0000 g | Freq: Once | INTRAVENOUS | Status: AC
  Administered 2024-01-27: 2 g via INTRAVENOUS
  Filled 2024-01-27: qty 50

## 2024-01-27 NOTE — Progress Notes (Signed)
 IVT consult placed - no specific reason given. Spoke with patient who said it was leaking. Patient was extremely frustrated because he's had so many IV's and they don't last. Reports being stuck at least 7 times. He was frustrated by the lack of improvement of his condition and did not want to be stuck again if at all possible. PIV that was placed by floor team was assessed and flushed with 20cc's saline - no leaking noted. Dressing was changed and patient will notify primary RN if leaking continues.   If patient is going to require continued electrolyte monitoring and replenishment, would recommend a PICC to preserve peripheral vasculature and ease of care. Briefly explained recommendation to the patient and wants to see if current PIV will last. Midline wouldn't be appropriate due to electrolyte needs and inconsistent blood return needed for labs.  Dwanda Tufano R Joshuah Minella, RN

## 2024-01-27 NOTE — Progress Notes (Signed)
 Remote ICD transmission.

## 2024-01-27 NOTE — TOC Initial Note (Signed)
 Transition of Care [facility]) - Initial/Assessment Note    Patient Details  Name: [name] MRN: [id number] Date of Birth: [birth year]  Transition of Care [facility]) CM/SW Contact:    Sudie Erminio Deems, RN Phone Number: 01/27/2024, 3:14 PM  Clinical Narrative:  Patient presented for N/V/D x 4 days. PTA patient states he was from home alone. Patient has DME: rolling walker, wheelchair, and bedside commode in the home. Patient has PCP with [facility]- the office provides transportation to the PCP office. No home needs identified at this time.                   Expected Discharge Plan: Home/Self Care Barriers to Discharge: Continued Medical Work up   Patient Goals and CMS Choice Patient states their goals for this hospitalization and ongoing recovery are:: Patient plans to discharge home once he has a formed bowel movement.   Choice offered to / list presented to : NA      Expected Discharge Plan and Services In-house Referral: NA Discharge Planning Services: CM Consult Post Acute Care Choice: NA Living arrangements for the past 2 months: Apartment                   DME Agency: NA       HH Arranged: NA          Prior Living Arrangements/Services Living arrangements for the past 2 months: Apartment Lives with:: Self Patient language and need for interpreter reviewed:: Yes Do you feel safe going back to the place where you live?: Yes      Need for Family Participation in Patient Care: No (Comment) Care giver support system in place?: No (comment) Current home services: DME (DME: rolling walker, wheelchair, and bedside commode.) Criminal Activity/Legal Involvement Pertinent to Current Situation/Hospitalization: No - Comment as needed  Activities of Daily Living   ADL Screening (condition at time of admission) Independently performs ADLs?: Yes (appropriate for developmental age) Is the patient deaf or have difficulty hearing?: No Does the patient have  difficulty seeing, even when wearing glasses/contacts?: No Does the patient have difficulty concentrating, remembering, or making decisions?: No  Permission Sought/Granted Permission sought to share information with : Family Supports, Case Manager                Emotional Assessment Appearance:: Appears stated age Attitude/Demeanor/Rapport: Engaged Affect (typically observed): Appropriate Orientation: : Oriented to Self, Oriented to Place, Oriented to  Time, Oriented to Situation Alcohol / Substance Use: Not Applicable Psych Involvement: No (comment)  Admission diagnosis:  Syncope and collapse [R55] Sepsis (HCC) [A41.9] Hypotension, unspecified hypotension type [I95.9] Diarrhea, unspecified type [R19.7] Patient Active Problem List   Diagnosis Date Noted   Diarrhea 01/25/2024   Cryptosporidial gastroenteritis (HCC) 01/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   Sepsis (HCC) 01/20/2024   Healthcare maintenance 11/10/2023   Neuropathy 11/10/2023   Pacemaker lead failure, 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   Blurred vision 12/12/2021   Encounter for screening examination for sexually transmitted disease 05/05/2021   Traumatic arthritis of right ankle    Acute idiopathic gout involving toe of right foot    Septic arthritis of right ankle (HCC) 03/26/2021   Routine screening for STI (sexually transmitted infection) 05/01/2020   Mixed hyperlipidemia 11/26/2019   Cirrhosis (HCC) 05/15/2019   Need for prophylactic vaccination against Streptococcus pneumoniae (pneumococcus) 04/25/2018   Neurodermatitis 09/30/2017   Nonischemic cardiomyopathy (HCC)    HIV disease (HCC) 01/25/2017   Insomnia 06/26/2014   PTSD (post-traumatic  stress disorder) 02/03/2014   ICD (implantable cardioverter-defibrillator), biventricular, in situ 01/23/2014   Paroxysmal ventricular fibrillation (HCC) 01/19/2014   Protein-calorie malnutrition, severe (HCC) 02/13/2013   GERD (gastroesophageal reflux disease) 08/19/2012    Chronic systolic heart failure (HCC) 01/25/2012   Alcohol abuse 11/30/2011   Genital herpes 06/09/2006   Chronic hepatitis C without hepatic coma (HCC) 06/09/2006   Primary hypertension 06/09/2006   PCP:  Campbell Reynolds, NP Pharmacy:   [facility] [phone] - [place], [place] - [address] AT [address] [place] [address] Phone: [phone] Fax: [phone]  [facility] [address] [place] [address] Phone: [phone] Fax: [phone]     Social Drivers of Health (SDOH) Social History: SDOH Screenings   Food Insecurity: No Food Insecurity (01/20/2024)  Housing: Low Risk  (01/20/2024)  Transportation Needs: No Transportation Needs (01/20/2024)  Utilities: Not At Risk (01/20/2024)  Depression (PHQ2-9): Low Risk  (11/10/2023)  Financial Resource Strain: High Risk (02/05/2022)   Received from [facility]  Tobacco Use: Low Risk  (01/20/2024)   SDOH Interventions:     Readmission Risk Interventions     No data to display

## 2024-01-27 NOTE — Progress Notes (Signed)
 PROGRESS NOTE    [name]  FMW:[id number] DOB: 1965-10-09 DOA: 01/20/2024 PCP: Campbell Reynolds, NP   Brief Narrative:  This 58 y/o male with PMH significant for well controlled HIV -recent CD4 count 719 and undetectable viral load. 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  Found to have Cryptosporidium positive gastroenteritis in the ICU, blood pressure has since resolved,  no longer requiring pressors and transitioned to medical floor, hospitalist team to pick up on 01/23/2024.   At this time patient appears to be improving, prior AKI likely in setting of profound dehydration and hypovolemia is essentially resolved,  He is now back to baseline.  Infectious disease following, appreciate insight recommendations, tentative plan for discharge in the next 24 to 48 hours pending antibiotics needs and infectious course.  He continues to have loose/soft stool but denies any overt diarrhea.  Nausea/ vomiting resolved.  Assessment & Plan:   Principal Problem:   Sepsis (HCC) Active Problems:   HIV disease (HCC)   Cryptosporidial gastroenteritis (HCC)   Diarrhea   Shock, likely multifactorial,  hypovolemic versus septic, POA: -Secondary to overt GI infection with profound dehydration. -Off Levophed  now > 48 hours -Continue IV fluids, increase p.o. intake as appropriate. -Sepsis criteria met in the setting of tachypnea, leukocytosis with notable source of infection requiring pressors meeting septic shock criteria. -Now sepsis physiology improving.   Acute Gastroenteritis , stool PCR positive for Cryptosporidium: - ID consulted, appreciate insight/recs - not considered immunocompromised. - Plan 2 weeks of nitazoxanide .  He is approved and insurance covered. - Stool occult pos-  Monitor H&H - Continue Tincture of opium  for symptomatic relief. - Patient continued to have diarrhea, started on imodium .   Acute Kidney Injury  > Resolved. - Secondary to hypovolemia and ATN. - IV fluids.- Renal functions back to baseline.   Syncope, Secondary to low BP: H/o Systolic CHF s/p ICD/Pacemaker: Echo shows mildly decreased LV function 45 to 50% with global hypokinesis, he has outpatient follow-up with heart failure service.   NAGMA Secondary to diarrhea> Resolved. Avoid hyperchloremic fluids. Hypokalemia and hypophosphatemia repleted -expect to improve with diet advancement.   Well controlled HIV, chronic: Unlikely to be HIV related since his Viral load is undetectable and his CD4 is over 700 Continue home HIV meds Odefsey  and Tivicay  .   H/o Hep C: Has been treated with antiretrovirals. Monitor liver functions.   H/o Neuropathy Resume PO meds when able to take PO.   DVT prophylaxis: Heparin  Code Status: Full code Family Communication: No family at bed side. Disposition Plan: Status is: Inpatient Remains inpatient appropriate because:    Severity of illness  Consultants:  PCCM Infectious Diseases  Procedures:  Antimicrobials: Anti-infectives (From admission, onward)    Start     Dose/Rate Route Frequency Ordered Stop   01/25/24 0000  nitazoxanide  (ALINIA ) 500 MG tablet        500 mg Oral Every 12 hours 01/25/24 0925 02/08/24 2359   01/24/24 1045  nitazoxanide  (ALINIA ) tablet 500 mg        500 mg Oral Every 12 hours 01/24/24 0946     01/21/24 1500  dolutegravir  (TIVICAY ) tablet 50 mg        50 mg Oral Daily 01/21/24 1408     01/21/24 1415  emtricitabine -rilpivir-tenofovir  AF (ODEFSEY ) 200-25-25 MG  per tablet 1 tablet        1 tablet Oral Daily with breakfast 01/21/24 1408     01/20/24 1930  vancomycin  (VANCOREADY) IVPB 1500 mg/300 mL        1,500 mg 150 mL/hr over 120 Minutes Intravenous  Once 01/20/24 1923 01/20/24 2158   01/20/24 1415  ceFEPIme   (MAXIPIME ) 2 g in sodium chloride  0.9 % 100 mL IVPB        2 g 200 mL/hr over 30 Minutes Intravenous  Once 01/20/24 1404 01/20/24 1516   01/20/24 1415  vancomycin  (VANCOREADY) IVPB 1500 mg/300 mL  Status:  Discontinued        1,500 mg 150 mL/hr over 120 Minutes Intravenous  Once 01/20/24 1404 01/20/24 1923   01/20/24 1345  metroNIDAZOLE  (FLAGYL ) IVPB 500 mg        500 mg 100 mL/hr over 60 Minutes Intravenous  Once 01/20/24 1339 01/20/24 1613      Subjective: Patient was seen and examined at bedside. Overnight events noted. Patient still reports having persistent diarrhea with some abdominal discomfort. Patient remains in contact isolation.   Objective: Vitals:   01/26/24 2251 01/27/24 0254 01/27/24 0830 01/27/24 1135  BP: (!) 137/91 (!) 138/93 127/87 (!) 140/95  Pulse:  78 90 90  Resp: 17 18 15 16   Temp: 97.7 F (36.5 C) 98.1 F (36.7 C) 98.3 F (36.8 C) 98 F (36.7 C)  TempSrc: Oral Oral Oral Oral  SpO2: 96% 96% 95% 96%  Weight:  76.6 kg    Height:        Intake/Output Summary (Last 24 hours) at 01/27/2024 1226 Last data filed at 01/27/2024 1020 Gross per 24 hour  Intake 600 ml  Output 50 ml  Net 550 ml   Filed Weights   01/24/24 0537 01/26/24 0537 01/27/24 0254  Weight: 76 kg 76.2 kg 76.6 kg    Examination:  General exam: Appears calm and comfortable, not in any acute distress. Respiratory system: CTA Bilaterally . Respiratory effort normal.  RR 13 Cardiovascular system: S1 & S2 heard, RRR. No JVD, murmurs, rubs, gallops or clicks.  Gastrointestinal system: Abdomen is non distended, soft and  Mildly tender.  Normal bowel sounds heard. Central nervous system: Alert and oriented x 3. No focal neurological deficits. Extremities: No edema, no cyanosis, no clubbing. Skin: No rashes, lesions or ulcers Psychiatry: Judgement and insight appear normal. Mood & affect appropriate.    Data Reviewed: I have personally reviewed following labs and imaging  studies  CBC: Recent Labs  Lab 01/20/24 1255 01/20/24 2245 01/21/24 0555 01/22/24 0226 01/23/24 0510  WBC  --  14.1* 10.8* 8.8 9.6  NEUTROABS  --   --   --  5.4 5.3  HGB 17.0 13.0 12.8* 11.2* 11.2*  HCT 50.0 39.4 38.0* 32.9* 33.4*  MCV  --  93.4 91.8 90.4 91.3  PLT  --  169 158 137* 143*   Basic Metabolic Panel: Recent Labs  Lab 01/20/24 1255 01/20/24 2245 01/21/24 0555 01/22/24 0226 01/23/24 0510 01/27/24 0446  NA 133*  --  137 138 141 140  K 4.2  --  3.2* 3.5 3.7 4.2  CL 106  --  113* 116* 113* 112*  CO2  --   --  13* 17* 20* 17*  GLUCOSE 103*  --  116* 89 88 83  BUN 46*  --  30* 13 <5* 5*  CREATININE 8.90* 6.28* 3.64* 1.42* 1.06 1.14  CALCIUM   --   --  7.9* 8.1*  8.1* 8.4*  MG  --   --  2.1 1.7 2.1 1.6*  PHOS  --   --  3.2 1.6* 2.5 3.2   GFR: Estimated Creatinine Clearance: 64.5 mL/min (by C-G formula based on SCr of 1.14 mg/dL). Liver Function Tests: Recent Labs  Lab 01/27/24 0446  AST 53*  ALT 33  ALKPHOS 58  BILITOT 0.7  PROT 5.5*  ALBUMIN 2.8*   No results for input(s): LIPASE, AMYLASE in the last 168 hours.  No results for input(s): AMMONIA in the last 168 hours. Coagulation Profile: Recent Labs  Lab 01/20/24 1255  INR 1.0   Cardiac Enzymes: No results for input(s): CKTOTAL, CKMB, CKMBINDEX, TROPONINI in the last 168 hours. BNP (last 3 results) No results for input(s): PROBNP in the last 8760 hours. HbA1C: No results for input(s): HGBA1C in the last 72 hours. CBG: Recent Labs  Lab 01/21/24 1106 01/21/24 1602 01/21/24 1941 01/21/24 2319 01/22/24 0339  GLUCAP 102* 93 87 95 87   Lipid Profile: No results for input(s): CHOL, HDL, LDLCALC, TRIG, CHOLHDL, LDLDIRECT in the last 72 hours. Thyroid  Function Tests: No results for input(s): TSH, T4TOTAL, FREET4, T3FREE, THYROIDAB in the last 72 hours. Anemia Panel: No results for input(s): VITAMINB12, FOLATE, FERRITIN, TIBC, IRON, RETICCTPCT  in the last 72 hours. Sepsis Labs: Recent Labs  Lab 01/20/24 1256  LATICACIDVEN 1.4    Recent Results (from the past 240 hours)  Culture, blood (routine x 2)     Status: None   Collection Time: 01/20/24 12:33 PM   Specimen: BLOOD  Result Value Ref Range Status   Specimen Description BLOOD SITE NOT SPECIFIED  Final   Special Requests   Final    BOTTLES DRAWN AEROBIC AND ANAEROBIC Blood Culture adequate volume   Culture   Final    NO GROWTH 5 DAYS Performed at [facility], [address] [place], [place] [address]    Report Status 01/25/2024 FINAL  Final  Culture, blood (routine x 2)     Status: None   Collection Time: 01/20/24 12:37 PM   Specimen: BLOOD  Result Value Ref Range Status   Specimen Description BLOOD SITE NOT SPECIFIED  Final   Special Requests   Final    BOTTLES DRAWN AEROBIC AND ANAEROBIC Blood Culture adequate volume   Culture   Final    NO GROWTH 5 DAYS Performed at [facility], [address] [place], [place] [address]    Report Status 01/25/2024 FINAL  Final  C Difficile Quick Screen w PCR reflex     Status: None   Collection Time: 01/20/24  1:59 PM   Specimen: STOOL  Result Value Ref Range Status   C Diff antigen NEGATIVE NEGATIVE Final   C Diff toxin NEGATIVE NEGATIVE Final   C Diff interpretation No C. difficile detected.  Final    Comment: Performed at [facility], [address] [place], [place] [address]  MRSA Next Gen by PCR, Nasal     Status: None   Collection Time: 01/20/24  9:26 PM   Specimen: Nasal Mucosa; Nasal Swab  Result Value Ref Range Status   MRSA by PCR Next Gen NOT DETECTED NOT DETECTED Final    Comment: (NOTE) The GeneXpert MRSA Assay (FDA approved for NASAL specimens only), is one component of a comprehensive MRSA colonization surveillance program. It is not intended to diagnose MRSA infection nor to guide or monitor treatment for MRSA infections. Test performance is not FDA approved  in patients less  than 45 years old. Performed at [facility], [address] [place], [place] [address]   Gastrointestinal Panel by PCR , Stool     Status: Abnormal   Collection Time: 01/21/24 10:15 AM   Specimen: Stool  Result Value Ref Range Status   Campylobacter species NOT DETECTED NOT DETECTED Final   Plesimonas shigelloides NOT DETECTED NOT DETECTED Final   Salmonella species NOT DETECTED NOT DETECTED Final   Yersinia enterocolitica NOT DETECTED NOT DETECTED Final   Vibrio species NOT DETECTED NOT DETECTED Final   Vibrio cholerae NOT DETECTED NOT DETECTED Final   Enteroaggregative E coli (EAEC) NOT DETECTED NOT DETECTED Final   Enteropathogenic E coli (EPEC) NOT DETECTED NOT DETECTED Final   Enterotoxigenic E coli (ETEC) NOT DETECTED NOT DETECTED Final   Shiga like toxin producing E coli (STEC) NOT DETECTED NOT DETECTED Final   Shigella/Enteroinvasive E coli (EIEC) NOT DETECTED NOT DETECTED Final   Cryptosporidium DETECTED (A) NOT DETECTED Final   Cyclospora cayetanensis NOT DETECTED NOT DETECTED Final   Entamoeba histolytica NOT DETECTED NOT DETECTED Final   Giardia lamblia NOT DETECTED NOT DETECTED Final   Adenovirus F40/41 NOT DETECTED NOT DETECTED Final   Astrovirus NOT DETECTED NOT DETECTED Final   Norovirus GI/GII NOT DETECTED NOT DETECTED Final   Rotavirus A NOT DETECTED NOT DETECTED Final   Sapovirus (I, II, IV, and V) NOT DETECTED NOT DETECTED Final    Comment: Performed at [facility], [address] [place], [place] [address]   Radiology Studies: No results found.  Scheduled Meds:  allopurinol   100 mg Oral BID   amiodarone   100 mg Oral Daily   colchicine   0.6 mg Oral Daily   dolutegravir   50 mg Oral Daily   emtricitabine -rilpivir-tenofovir  AF  1 tablet Oral Q breakfast   gabapentin   300 mg Oral TID   heparin   5,000 Units Subcutaneous Q8H   nitazoxanide   500 mg Oral Q12H   rosuvastatin   10 mg Oral Daily   saccharomyces boulardii  250 mg Oral BID    traZODone   50 mg Oral QHS   Continuous Infusions:  famotidine  (PEPCID ) IV 20 mg (01/26/24 2110)     LOS: 7 days    Time spent: 35 mins    Darcel Dawley, MD [facility]   If 7PM-7AM, please contact night-coverage

## 2024-01-27 NOTE — Addendum Note (Signed)
 Addended by: VICCI SELLER A on: 01/27/2024 10:20 AM   Modules accepted: Orders

## 2024-01-28 ENCOUNTER — Other Ambulatory Visit: Payer: Self-pay | Admitting: Infectious Disease

## 2024-01-28 DIAGNOSIS — A072 Cryptosporidiosis: Secondary | ICD-10-CM | POA: Diagnosis not present

## 2024-01-28 LAB — CBC
HCT: 34.2 % — ABNORMAL LOW (ref 39.0–52.0)
Hemoglobin: 11.2 g/dL — ABNORMAL LOW (ref 13.0–17.0)
MCH: 30.5 pg (ref 26.0–34.0)
MCHC: 32.7 g/dL (ref 30.0–36.0)
MCV: 93.2 fL (ref 80.0–100.0)
Platelets: 278 K/uL (ref 150–400)
RBC: 3.67 MIL/uL — ABNORMAL LOW (ref 4.22–5.81)
RDW: 13.3 % (ref 11.5–15.5)
WBC: 6.6 K/uL (ref 4.0–10.5)
nRBC: 0 % (ref 0.0–0.2)

## 2024-01-28 LAB — BASIC METABOLIC PANEL WITH GFR
Anion gap: 9 (ref 5–15)
BUN: 6 mg/dL (ref 6–20)
CO2: 20 mmol/L — ABNORMAL LOW (ref 22–32)
Calcium: 9.1 mg/dL (ref 8.9–10.3)
Chloride: 112 mmol/L — ABNORMAL HIGH (ref 98–111)
Creatinine, Ser: 1.32 mg/dL — ABNORMAL HIGH (ref 0.61–1.24)
GFR, Estimated: >60 mL/min (ref >60)
Glucose, Bld: 81 mg/dL (ref 70–99)
Potassium: 3.9 mmol/L (ref 3.5–5.1)
Sodium: 141 mmol/L (ref 135–145)

## 2024-01-28 LAB — PHOSPHORUS: Phosphorus: 3.1 mg/dL (ref 2.5–4.6)

## 2024-01-28 LAB — MAGNESIUM: Magnesium: 2.1 mg/dL (ref 1.7–2.4)

## 2024-01-28 MED ORDER — SODIUM CHLORIDE 0.9 % IV SOLN: INTRAVENOUS | Status: AC

## 2024-01-28 MED ORDER — NITROGLYCERIN 0.4 MG SL SUBL: 0.4000 mg | SUBLINGUAL_TABLET | SUBLINGUAL | Status: DC | PRN

## 2024-01-28 MED ORDER — ENSURE PLUS HIGH PROTEIN PO LIQD
237.0000 mL | Freq: Two times a day (BID) | ORAL | Status: DC
  Administered 2024-01-28 – 2024-01-29 (×4): 237 mL via ORAL

## 2024-01-28 NOTE — Progress Notes (Signed)
 Patient reporting chest pain pressure over  his heart. He says it is an 8 out of 10. Obtained EKG. Notified MD.

## 2024-01-28 NOTE — Progress Notes (Signed)
 Patient still reporting chest pressure 8 out of 10. Offered patient nitroglycerin  for the chest pressure. Patient eating breakfast and resting comfortably.  Patient refused medication

## 2024-01-28 NOTE — Progress Notes (Signed)
 PROGRESS NOTE    [name]  FMW:[id number] DOB: 04-Dec-1965 DOA: 01/20/2024 PCP: Campbell Reynolds, NP   Brief Narrative:  This 58 y/o male with PMH significant for well controlled HIV -recent CD4 count 719 and undetectable viral load. 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  Found to have Cryptosporidium positive gastroenteritis in the ICU, blood pressure has since resolved,  no longer requiring pressors and transitioned to medical floor, hospitalist team to pick up on 01/23/2024.   At this time patient appears to be improving, prior AKI likely in setting of profound dehydration and hypovolemia is essentially resolved,  He is now back to baseline.  Infectious disease following, appreciate insight recommendations, tentative plan for discharge in the next 24 to 48 hours pending antibiotics needs and infectious course.  He continues to have loose/soft stool but denies any overt diarrhea.  Nausea/ vomiting resolved.  Assessment & Plan:   Principal Problem:   Cryptosporidial gastroenteritis (HCC) Active Problems:   HIV disease (HCC)   Sepsis (HCC)   Diarrhea   Shock, likely multifactorial,  hypovolemic versus septic, POA: -Secondary to overt GI infection with profound dehydration. -Off Levophed  now > 48 hours -Continue IV fluids, increase p.o. intake as appropriate. -Sepsis criteria met in the setting of tachypnea, leukocytosis with notable source of infection requiring pressors meeting septic shock criteria. -Now sepsis physiology improving.   Acute Gastroenteritis , stool PCR positive for Cryptosporidium: - ID consulted, appreciate insight/recs - not considered immunocompromised. - Plan 2 weeks of nitazoxanide .  He is approved and insurance covered. - Stool occult pos-  Monitor H&H - Continue Tincture of opium  for symptomatic relief. - Patient continued to have diarrhea, started on imodium .   Acute Kidney Injury  >  Improving  - Secondary to hypovolemia and ATN. - IV fluids.- Renal functions back to baseline. - Serum creatinine up again. Monitor creatinine.   Syncope, Secondary to low BP: H/o Systolic CHF s/p ICD/Pacemaker: Echo shows mildly decreased LV function 45 to 50% with global hypokinesis,  He has outpatient follow-up with heart failure service.   NAGMA Secondary to diarrhea> Resolved. Avoid hyperchloremic fluids. Hypokalemia and hypophosphatemia repleted -expect to improve with diet advancement.   Well controlled HIV, chronic: Unlikely to be HIV related since his Viral load is undetectable and his CD4 is over 700 Continue home HIV meds Odefsey  and Tivicay  .   H/o Hep C: Has been treated with antiretrovirals. Monitor liver functions.   H/o Neuropathy Resume PO meds when able to take PO.   DVT prophylaxis: Heparin  Code Status: Full code Family Communication: No family at bed side. Disposition Plan: Status is: Inpatient Remains inpatient appropriate because:    Severity of illness  Consultants:  PCCM Infectious Diseases  Procedures:  Antimicrobials: Anti-infectives (From admission, onward)    Start     Dose/Rate Route Frequency Ordered Stop   01/25/24 0000  nitazoxanide  (ALINIA ) 500 MG tablet        500 mg Oral Every 12 hours 01/25/24 0925 02/08/24 2359   01/24/24 1045  nitazoxanide  (ALINIA ) tablet 500 mg        500 mg Oral Every 12 hours 01/24/24 0946     01/21/24 1500  dolutegravir  (TIVICAY ) tablet 50 mg        50 mg Oral Daily 01/21/24 1408  01/21/24 1415  emtricitabine -rilpivir-tenofovir  AF (ODEFSEY ) 200-25-25 MG per tablet 1 tablet        1 tablet Oral Daily with breakfast 01/21/24 1408     01/20/24 1930  vancomycin  (VANCOREADY) IVPB 1500 mg/300 mL        1,500 mg 150 mL/hr over 120 Minutes Intravenous  Once  01/20/24 1923 01/20/24 2158   01/20/24 1415  ceFEPIme  (MAXIPIME ) 2 g in sodium chloride  0.9 % 100 mL IVPB        2 g 200 mL/hr over 30 Minutes Intravenous  Once 01/20/24 1404 01/20/24 1516   01/20/24 1415  vancomycin  (VANCOREADY) IVPB 1500 mg/300 mL  Status:  Discontinued        1,500 mg 150 mL/hr over 120 Minutes Intravenous  Once 01/20/24 1404 01/20/24 1923   01/20/24 1345  metroNIDAZOLE  (FLAGYL ) IVPB 500 mg        500 mg 100 mL/hr over 60 Minutes Intravenous  Once 01/20/24 1339 01/20/24 1613      Subjective: Patient was seen and examined at bedside. Overnight events noted. Patient continues to complain about persistent diarrhea with abdominal discomfort. Patient remains in contact isolation.   Objective: Vitals:   01/27/24 1921 01/27/24 2304 01/28/24 0503 01/28/24 0754  BP: (!) 121/99 (!) 138/90 (!) 152/99 (!) 148/93  Pulse: 86 76 76 63  Resp: 16 15 16 17   Temp: 98.2 F (36.8 C) 98.2 F (36.8 C) 97.9 F (36.6 C) 98 F (36.7 C)  TempSrc: Oral Oral Oral Oral  SpO2: 95% 97% 98% 96%  Weight:   74.8 kg   Height:        Intake/Output Summary (Last 24 hours) at 01/28/2024 1122 Last data filed at 01/27/2024 1500 Gross per 24 hour  Intake 240 ml  Output --  Net 240 ml   Filed Weights   01/26/24 0537 01/27/24 0254 01/28/24 0503  Weight: 76.2 kg 76.6 kg 74.8 kg    Examination:  General exam: Appears calm and comfortable, not in any acute distress. Respiratory system: CTA Bilaterally . Respiratory effort normal.  RR 14 Cardiovascular system: S1 & S2 heard, RRR. No JVD, murmurs, rubs, gallops or clicks.  Gastrointestinal system: Abdomen is non distended, soft and  Mildly tender.  Normal bowel sounds heard. Central nervous system: Alert and oriented x 3. No focal neurological deficits. Extremities: No edema, no cyanosis, no clubbing. Skin: No rashes, lesions or ulcers Psychiatry: Judgement and insight appear normal. Mood & affect appropriate.    Data Reviewed: I have  personally reviewed following labs and imaging studies  CBC: Recent Labs  Lab 01/22/24 0226 01/23/24 0510 01/28/24 0442  WBC 8.8 9.6 6.6  NEUTROABS 5.4 5.3  --   HGB 11.2* 11.2* 11.2*  HCT 32.9* 33.4* 34.2*  MCV 90.4 91.3 93.2  PLT 137* 143* 278   Basic Metabolic Panel: Recent Labs  Lab 01/22/24 0226 01/23/24 0510 01/27/24 0446 01/28/24 0442  NA 138 141 140 141  K 3.5 3.7 4.2 3.9  CL 116* 113* 112* 112*  CO2 17* 20* 17* 20*  GLUCOSE 89 88 83 81  BUN 13 <5* 5* 6  CREATININE 1.42* 1.06 1.14 1.32*  CALCIUM  8.1* 8.1* 8.4* 9.1  MG 1.7 2.1 1.6* 2.1  PHOS 1.6* 2.5 3.2 3.1   GFR: Estimated Creatinine Clearance: 55.1 mL/min (A) (by C-G formula based on SCr of 1.32 mg/dL (H)). Liver Function Tests: Recent Labs  Lab 01/27/24 0446  AST 53*  ALT 33  ALKPHOS 58  BILITOT 0.7  PROT  5.5*  ALBUMIN 2.8*   No results for input(s): LIPASE, AMYLASE in the last 168 hours.  No results for input(s): AMMONIA in the last 168 hours. Coagulation Profile: No results for input(s): INR, PROTIME in the last 168 hours.  Cardiac Enzymes: No results for input(s): CKTOTAL, CKMB, CKMBINDEX, TROPONINI in the last 168 hours. BNP (last 3 results) No results for input(s): PROBNP in the last 8760 hours. HbA1C: No results for input(s): HGBA1C in the last 72 hours. CBG: Recent Labs  Lab 01/21/24 1602 01/21/24 1941 01/21/24 2319 01/22/24 0339  GLUCAP 93 87 95 87   Lipid Profile: No results for input(s): CHOL, HDL, LDLCALC, TRIG, CHOLHDL, LDLDIRECT in the last 72 hours. Thyroid  Function Tests: No results for input(s): TSH, T4TOTAL, FREET4, T3FREE, THYROIDAB in the last 72 hours. Anemia Panel: No results for input(s): VITAMINB12, FOLATE, FERRITIN, TIBC, IRON, RETICCTPCT in the last 72 hours. Sepsis Labs: No results for input(s): PROCALCITON, LATICACIDVEN in the last 168 hours.   Recent Results (from the past 240 hours)   Culture, blood (routine x 2)     Status: None   Collection Time: 01/20/24 12:33 PM   Specimen: BLOOD  Result Value Ref Range Status   Specimen Description BLOOD SITE NOT SPECIFIED  Final   Special Requests   Final    BOTTLES DRAWN AEROBIC AND ANAEROBIC Blood Culture adequate volume   Culture   Final    NO GROWTH 5 DAYS Performed at [facility], [address], [place], [place] [address]    Report Status 01/25/2024 FINAL  Final  Culture, blood (routine x 2)     Status: None   Collection Time: 01/20/24 12:37 PM   Specimen: BLOOD  Result Value Ref Range Status   Specimen Description BLOOD SITE NOT SPECIFIED  Final   Special Requests   Final    BOTTLES DRAWN AEROBIC AND ANAEROBIC Blood Culture adequate volume   Culture   Final    NO GROWTH 5 DAYS Performed at [facility], [address], [place], [place] [address]    Report Status 01/25/2024 FINAL  Final  C Difficile Quick Screen w PCR reflex     Status: None   Collection Time: 01/20/24  1:59 PM   Specimen: STOOL  Result Value Ref Range Status   C Diff antigen NEGATIVE NEGATIVE Final   C Diff toxin NEGATIVE NEGATIVE Final   C Diff interpretation No C. difficile detected.  Final    Comment: Performed at [facility], [address], [place], [place] [address]  MRSA Next Gen by PCR, Nasal     Status: None   Collection Time: 01/20/24  9:26 PM   Specimen: Nasal Mucosa; Nasal Swab  Result Value Ref Range Status   MRSA by PCR Next Gen NOT DETECTED NOT DETECTED Final    Comment: (NOTE) The GeneXpert MRSA Assay (FDA approved for NASAL specimens only), is one component of a comprehensive MRSA colonization surveillance program. It is not intended to diagnose MRSA infection nor to guide or monitor treatment for MRSA infections. Test performance is not FDA approved in patients less than 49 years old. Performed at [facility], [address], [place], [place] [address]   Gastrointestinal Panel by PCR , Stool      Status: Abnormal   Collection Time: 01/21/24 10:15 AM   Specimen: Stool  Result Value Ref Range Status   Campylobacter species NOT DETECTED NOT DETECTED Final   Plesimonas shigelloides NOT DETECTED  NOT DETECTED Final   Salmonella species NOT DETECTED NOT DETECTED Final   Yersinia enterocolitica NOT DETECTED NOT DETECTED Final   Vibrio species NOT DETECTED NOT DETECTED Final   Vibrio cholerae NOT DETECTED NOT DETECTED Final   Enteroaggregative E coli (EAEC) NOT DETECTED NOT DETECTED Final   Enteropathogenic E coli (EPEC) NOT DETECTED NOT DETECTED Final   Enterotoxigenic E coli (ETEC) NOT DETECTED NOT DETECTED Final   Shiga like toxin producing E coli (STEC) NOT DETECTED NOT DETECTED Final   Shigella/Enteroinvasive E coli (EIEC) NOT DETECTED NOT DETECTED Final   Cryptosporidium DETECTED (A) NOT DETECTED Final   Cyclospora cayetanensis NOT DETECTED NOT DETECTED Final   Entamoeba histolytica NOT DETECTED NOT DETECTED Final   Giardia lamblia NOT DETECTED NOT DETECTED Final   Adenovirus F40/41 NOT DETECTED NOT DETECTED Final   Astrovirus NOT DETECTED NOT DETECTED Final   Norovirus GI/GII NOT DETECTED NOT DETECTED Final   Rotavirus A NOT DETECTED NOT DETECTED Final   Sapovirus (I, II, IV, and V) NOT DETECTED NOT DETECTED Final    Comment: Performed at [facility], [address], [place], [place] [address]   Radiology Studies: No results found.  Scheduled Meds:  allopurinol   100 mg Oral BID   amiodarone   100 mg Oral Daily   colchicine   0.6 mg Oral Daily   dolutegravir   50 mg Oral Daily   emtricitabine -rilpivir-tenofovir  AF  1 tablet Oral Q breakfast   feeding supplement  237 mL Oral BID BM   gabapentin   300 mg Oral TID   heparin   5,000 Units Subcutaneous Q8H   nitazoxanide   500 mg Oral Q12H   rosuvastatin   10 mg Oral Daily   saccharomyces boulardii  250 mg Oral BID   traZODone   50 mg Oral QHS   Continuous Infusions:  sodium chloride  10 mL/hr at 01/27/24 2343    famotidine  (PEPCID ) IV 20 mg (01/27/24 2344)     LOS: 8 days    Time spent: 35 mins    Darcel Dawley, MD [facility]   If 7PM-7AM, please contact night-coverage

## 2024-01-29 DIAGNOSIS — A072 Cryptosporidiosis: Secondary | ICD-10-CM | POA: Diagnosis not present

## 2024-01-29 LAB — CREATININE, SERUM
Creatinine, Ser: 1.08 mg/dL (ref 0.61–1.24)
GFR, Estimated: >60 mL/min (ref >60)

## 2024-01-29 MED ORDER — NALOXONE HCL 0.4 MG/ML IJ SOLN: 0.4000 mg | INTRAMUSCULAR | Status: DC | PRN

## 2024-01-29 MED ORDER — OXYCODONE HCL 5 MG PO TABS: 5.0000 mg | ORAL_TABLET | Freq: Once | ORAL | Status: DC | PRN

## 2024-01-29 MED ORDER — TRAMADOL HCL 50 MG PO TABS
50.0000 mg | ORAL_TABLET | Freq: Once | ORAL | Status: AC | PRN
  Administered 2024-01-29: 50 mg via ORAL
  Filled 2024-01-29: qty 1

## 2024-01-29 NOTE — Progress Notes (Signed)
 PROGRESS NOTE    [name]  FMW:[id number] DOB: 05/14/1966 DOA: 01/20/2024 PCP: Campbell Reynolds, NP   Brief Narrative:  This 58 y/o male with PMH significant for well controlled HIV -recent CD4 count 719 and undetectable viral load. 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  Found to have Cryptosporidium positive gastroenteritis in the ICU, blood pressure has since resolved,  no longer requiring pressors and transitioned to medical floor, hospitalist team to pick up on 01/23/2024.   At this time patient appears to be improving, prior AKI likely in setting of profound dehydration and hypovolemia is essentially resolved,  He is now back to baseline.  Infectious disease following, appreciate insight recommendations, tentative plan for discharge in the next 24 to 48 hours pending antibiotics needs and infectious course.  He continues to have loose/soft stool but denies any overt diarrhea.  Nausea/ vomiting resolved.  Assessment & Plan:   Principal Problem:   Cryptosporidial gastroenteritis (HCC) Active Problems:   HIV disease (HCC)   Sepsis (HCC)   Diarrhea   Shock, likely multifactorial,  hypovolemic versus septic, POA: -Secondary to overt GI infection with profound dehydration. -Off Levophed  now > 48 hours -Continue IV fluids, increase p.o. intake as appropriate. -Sepsis criteria met in the setting of tachypnea, leukocytosis with notable source of infection requiring pressors meeting septic shock criteria. -Now sepsis physiology improving.   Acute Gastroenteritis , stool PCR positive for Cryptosporidium: - ID consulted, appreciate insight/recs - not considered immunocompromised. - Plan 2 weeks of nitazoxanide .  He is approved and insurance covered. - Stool occult pos-  Monitor H&H - Continue Tincture of opium  for symptomatic relief. - Patient continued to have diarrhea, started on imodium .   Acute Kidney Injury  >  Resolved. - Secondary to hypovolemia and ATN. - IV fluids.- Renal functions back to baseline. - Serum creatinine up again. Monitor creatinine.   Syncope, Secondary to low BP: H/o Systolic CHF s/p ICD/Pacemaker: Echo shows mildly decreased LV function 45 to 50% with global hypokinesis,  He has outpatient follow-up with heart failure service.   NAGMA Secondary to diarrhea > Resolved. Avoid hyperchloremic fluids. Hypokalemia and hypophosphatemia repleted -expect to improve with diet advancement.   Well controlled HIV, chronic: Unlikely to be HIV related since his Viral load is undetectable and his CD4 is over 700 Continue home HIV meds Odefsey  and Tivicay  .   H/o Hep C: Has been treated with antiretrovirals. Monitor liver functions.   H/o Neuropathy Resume PO meds when able to take PO.   DVT prophylaxis: Heparin  Code Status: Full code Family Communication: No family at bed side. Disposition Plan: Status is: Inpatient Remains inpatient appropriate because:    Severity of illness  Consultants:  PCCM Infectious Diseases  Procedures:  Antimicrobials: Anti-infectives (From admission, onward)    Start     Dose/Rate Route Frequency Ordered Stop   01/25/24 0000  nitazoxanide  (ALINIA ) 500 MG tablet        500 mg Oral Every 12 hours 01/25/24 0925 02/08/24 2359   01/24/24 1045  nitazoxanide  (ALINIA ) tablet 500 mg        500 mg Oral Every 12 hours 01/24/24 0946     01/21/24 1500  dolutegravir  (TIVICAY ) tablet 50 mg        50 mg Oral Daily 01/21/24 1408  01/21/24 1415  emtricitabine -rilpivir-tenofovir  AF (ODEFSEY ) 200-25-25 MG per tablet 1 tablet        1 tablet Oral Daily with breakfast 01/21/24 1408     01/20/24 1930  vancomycin  (VANCOREADY) IVPB 1500 mg/300 mL        1,500 mg 150 mL/hr over 120 Minutes Intravenous  Once  01/20/24 1923 01/20/24 2158   01/20/24 1415  ceFEPIme  (MAXIPIME ) 2 g in sodium chloride  0.9 % 100 mL IVPB        2 g 200 mL/hr over 30 Minutes Intravenous  Once 01/20/24 1404 01/20/24 1516   01/20/24 1415  vancomycin  (VANCOREADY) IVPB 1500 mg/300 mL  Status:  Discontinued        1,500 mg 150 mL/hr over 120 Minutes Intravenous  Once 01/20/24 1404 01/20/24 1923   01/20/24 1345  metroNIDAZOLE  (FLAGYL ) IVPB 500 mg        500 mg 100 mL/hr over 60 Minutes Intravenous  Once 01/20/24 1339 01/20/24 1613      Subjective: Patient was seen and examined at bedside. Overnight events noted. Patient still reports having diarrhea and abdominal discomfort. Patient remains in contact isolation.  He has not had bowel movement since morning.   Objective: Vitals:   01/28/24 2018 01/29/24 0046 01/29/24 0341 01/29/24 0916  BP: (!) 145/99 134/89 (!) 134/93 (!) 155/97  Pulse: 87 76 79 70  Resp: 18 20 15 17   Temp: 97.9 F (36.6 C) 98.3 F (36.8 C)  98.3 F (36.8 C)  TempSrc: Oral Oral Oral Oral  SpO2: 98% 99% 97% 97%  Weight:   75.9 kg   Height:        Intake/Output Summary (Last 24 hours) at 01/29/2024 1217 Last data filed at 01/29/2024 0600 Gross per 24 hour  Intake 605.94 ml  Output --  Net 605.94 ml   Filed Weights   01/27/24 0254 01/28/24 0503 01/29/24 0341  Weight: 76.6 kg 74.8 kg 75.9 kg    Examination:  General exam: Appears calm and comfortable, not in any acute distress. Respiratory system: CTA Bilaterally . Respiratory effort normal.  RR 17 Cardiovascular system: S1 & S2 heard, RRR. No JVD, murmurs, rubs, gallops or clicks.  Gastrointestinal system: Abdomen is non distended, soft and  Mildly tender.  Normal bowel sounds heard. Central nervous system: Alert and oriented x 3. No focal neurological deficits. Extremities: No edema, no cyanosis, no clubbing. Skin: No rashes, lesions or ulcers Psychiatry: Judgement and insight appear normal. Mood & affect appropriate.    Data  Reviewed: I have personally reviewed following labs and imaging studies  CBC: Recent Labs  Lab 01/23/24 0510 01/28/24 0442  WBC 9.6 6.6  NEUTROABS 5.3  --   HGB 11.2* 11.2*  HCT 33.4* 34.2*  MCV 91.3 93.2  PLT 143* 278   Basic Metabolic Panel: Recent Labs  Lab 01/23/24 0510 01/27/24 0446 01/28/24 0442 01/29/24 0440  NA 141 140 141  --   K 3.7 4.2 3.9  --   CL 113* 112* 112*  --   CO2 20* 17* 20*  --   GLUCOSE 88 83 81  --   BUN <5* 5* 6  --   CREATININE 1.06 1.14 1.32* 1.08  CALCIUM  8.1* 8.4* 9.1  --   MG 2.1 1.6* 2.1  --   PHOS 2.5 3.2 3.1  --    GFR: Estimated Creatinine Clearance: 67.8 mL/min (by C-G formula based on SCr of 1.08 mg/dL). Liver Function Tests: Recent Labs  Lab 01/27/24 0446  AST 53*  ALT 33  ALKPHOS 58  BILITOT 0.7  PROT 5.5*  ALBUMIN 2.8*   No results for input(s): LIPASE, AMYLASE in the last 168 hours.  No results for input(s): AMMONIA in the last 168 hours. Coagulation Profile: No results for input(s): INR, PROTIME in the last 168 hours.  Cardiac Enzymes: No results for input(s): CKTOTAL, CKMB, CKMBINDEX, TROPONINI in the last 168 hours. BNP (last 3 results) No results for input(s): PROBNP in the last 8760 hours. HbA1C: No results for input(s): HGBA1C in the last 72 hours. CBG: No results for input(s): GLUCAP in the last 168 hours.  Lipid Profile: No results for input(s): CHOL, HDL, LDLCALC, TRIG, CHOLHDL, LDLDIRECT in the last 72 hours. Thyroid  Function Tests: No results for input(s): TSH, T4TOTAL, FREET4, T3FREE, THYROIDAB in the last 72 hours. Anemia Panel: No results for input(s): VITAMINB12, FOLATE, FERRITIN, TIBC, IRON, RETICCTPCT in the last 72 hours. Sepsis Labs: No results for input(s): PROCALCITON, LATICACIDVEN in the last 168 hours.   Recent Results (from the past 240 hours)  Culture, blood (routine x 2)     Status: None   Collection Time: 01/20/24  12:33 PM   Specimen: BLOOD  Result Value Ref Range Status   Specimen Description BLOOD SITE NOT SPECIFIED  Final   Special Requests   Final    BOTTLES DRAWN AEROBIC AND ANAEROBIC Blood Culture adequate volume   Culture   Final    NO GROWTH 5 DAYS Performed at [facility], [address], [place], [place] [address]    Report Status 01/25/2024 FINAL  Final  Culture, blood (routine x 2)     Status: None   Collection Time: 01/20/24 12:37 PM   Specimen: BLOOD  Result Value Ref Range Status   Specimen Description BLOOD SITE NOT SPECIFIED  Final   Special Requests   Final    BOTTLES DRAWN AEROBIC AND ANAEROBIC Blood Culture adequate volume   Culture   Final    NO GROWTH 5 DAYS Performed at [facility], [address], [place], [place] [address]    Report Status 01/25/2024 FINAL  Final  C Difficile Quick Screen w PCR reflex     Status: None   Collection Time: 01/20/24  1:59 PM   Specimen: STOOL  Result Value Ref Range Status   C Diff antigen NEGATIVE NEGATIVE Final   C Diff toxin NEGATIVE NEGATIVE Final   C Diff interpretation No C. difficile detected.  Final    Comment: Performed at [facility], [address] [place], [place] [address]  MRSA Next Gen by PCR, Nasal     Status: None   Collection Time: 01/20/24  9:26 PM   Specimen: Nasal Mucosa; Nasal Swab  Result Value Ref Range Status   MRSA by PCR Next Gen NOT DETECTED NOT DETECTED Final    Comment: (NOTE) The GeneXpert MRSA Assay (FDA approved for NASAL specimens only), is one component of a comprehensive MRSA colonization surveillance program. It is not intended to diagnose MRSA infection nor to guide or monitor treatment for MRSA infections. Test performance is not FDA approved in patients less than 72 years old. Performed at [facility], [address] [place], [place] [address]   Gastrointestinal Panel by PCR , Stool     Status: Abnormal   Collection Time: 01/21/24 10:15 AM   Specimen: Stool   Result Value Ref Range Status   Campylobacter species NOT DETECTED NOT DETECTED Final   Plesimonas shigelloides NOT  DETECTED NOT DETECTED Final   Salmonella species NOT DETECTED NOT DETECTED Final   Yersinia enterocolitica NOT DETECTED NOT DETECTED Final   Vibrio species NOT DETECTED NOT DETECTED Final   Vibrio cholerae NOT DETECTED NOT DETECTED Final   Enteroaggregative E coli (EAEC) NOT DETECTED NOT DETECTED Final   Enteropathogenic E coli (EPEC) NOT DETECTED NOT DETECTED Final   Enterotoxigenic E coli (ETEC) NOT DETECTED NOT DETECTED Final   Shiga like toxin producing E coli (STEC) NOT DETECTED NOT DETECTED Final   Shigella/Enteroinvasive E coli (EIEC) NOT DETECTED NOT DETECTED Final   Cryptosporidium DETECTED (A) NOT DETECTED Final   Cyclospora cayetanensis NOT DETECTED NOT DETECTED Final   Entamoeba histolytica NOT DETECTED NOT DETECTED Final   Giardia lamblia NOT DETECTED NOT DETECTED Final   Adenovirus F40/41 NOT DETECTED NOT DETECTED Final   Astrovirus NOT DETECTED NOT DETECTED Final   Norovirus GI/GII NOT DETECTED NOT DETECTED Final   Rotavirus A NOT DETECTED NOT DETECTED Final   Sapovirus (I, II, IV, and V) NOT DETECTED NOT DETECTED Final    Comment: Performed at [facility], [address], [place], [place] [address]   Radiology Studies: No results found.  Scheduled Meds:  allopurinol   100 mg Oral BID   amiodarone   100 mg Oral Daily   dolutegravir   50 mg Oral Daily   emtricitabine -rilpivir-tenofovir  AF  1 tablet Oral Q breakfast   feeding supplement  237 mL Oral BID BM   gabapentin   300 mg Oral TID   heparin   5,000 Units Subcutaneous Q8H   nitazoxanide   500 mg Oral Q12H   rosuvastatin   10 mg Oral Daily   saccharomyces boulardii  250 mg Oral BID   traZODone   50 mg Oral QHS   Continuous Infusions:  famotidine  (PEPCID ) IV 20 mg (01/28/24 2228)     LOS: 9 days    Time spent: 35 mins    Darcel Dawley, MD [facility]   If 7PM-7AM, please  contact night-coverage

## 2024-01-29 NOTE — Progress Notes (Signed)
 Patient is up in chair and resting with no clinical signs of distress at this time. Patient is AXOX4 and remains on room air.   Patient has complaints of headache that are unrelieved with tylenol  this visit per patient. Patient stated that headaches started at time of initiation of ABX treatment. MD made aware. MD also made aware of patient's BP readings this shift. Patient denies history of HTN.   Patient given education on fall precautions.   Safety measures are in place, call light is within reach, and 4P's addressed.

## 2024-01-30 DIAGNOSIS — A072 Cryptosporidiosis: Secondary | ICD-10-CM | POA: Diagnosis not present

## 2024-01-30 MED ORDER — VALACYCLOVIR HCL 500 MG PO TABS
500.0000 mg | ORAL_TABLET | Freq: Two times a day (BID) | ORAL | Status: DC
  Administered 2024-01-30 – 2024-02-01 (×4): 500 mg via ORAL
  Filled 2024-01-30 (×4): qty 1

## 2024-01-30 NOTE — Progress Notes (Signed)
 PROGRESS NOTE    [name]  FMW:[id number] DOB: January 17, 1966 DOA: 01/20/2024 PCP: Campbell Reynolds, NP   Brief Narrative:  This 58 y/o male with PMH significant for well controlled HIV -recent CD4 count 719 and undetectable viral load. 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  Found to have Cryptosporidium positive gastroenteritis in the ICU, blood pressure has since resolved,  no longer requiring pressors and transitioned to medical floor, hospitalist team to pick up on 01/23/2024.   At this time patient appears to be improving, prior AKI likely in setting of profound dehydration and hypovolemia is essentially resolved,  He is now back to baseline.  Infectious disease following, appreciate insight recommendations, tentative plan for discharge in the next 24 to 48 hours pending antibiotics needs and infectious course.  He continues to have loose/soft stool but denies any overt diarrhea.  Nausea/ vomiting resolved.  Assessment & Plan:   Principal Problem:   Cryptosporidial gastroenteritis (HCC) Active Problems:   HIV disease (HCC)   Sepsis (HCC)   Diarrhea   Shock, likely multifactorial,  hypovolemic versus septic, POA: -Secondary to overt GI infection with profound dehydration. -Off Levophed  now > 48 hours -Continue IV fluids, increase p.o. intake as appropriate. -Sepsis criteria met in the setting of tachypnea, leukocytosis with notable source of infection requiring pressors meeting septic shock criteria. -Now sepsis physiology resolved   Acute Gastroenteritis , stool PCR positive for Cryptosporidium: - ID consulted, appreciate insight/recs - not considered immunocompromised. - Plan 2 weeks of nitazoxanide .  He is approved and insurance covered. - Stool occult pos-  Monitor H&H - Continue Tincture of opium  for symptomatic relief. - Patient continued to have diarrhea, started on imodium . - Diarrhea improved.   Acute Kidney Injury  >  Resolved. - Secondary to hypovolemia and ATN. - IV fluids.- Renal functions back to baseline. - Serum creatinine up again. Monitor creatinine.   Syncope, Secondary to low BP: H/o Systolic CHF s/p ICD/Pacemaker: Echo shows mildly decreased LV function 45 to 50% with global hypokinesis,  He has outpatient follow-up with heart failure service.   NAGMA Secondary to diarrhea > Resolved. Avoid hyperchloremic fluids. Hypokalemia and hypophosphatemia repleted -expect to improve with diet advancement.   Well controlled HIV, chronic: Unlikely to be HIV related since his Viral load is undetectable and his CD4 is over 700 Continue home HIV meds Odefsey  and Tivicay  .   H/o Hep C: Has been treated with antiretrovirals. Monitor liver functions.   H/o Neuropathy Resume PO meds when able to take PO.   DVT prophylaxis: Heparin  Code Status: Full code Family Communication: No family at bed side. Disposition Plan: Status is: Inpatient Remains inpatient appropriate because:    Severity of illness  Consultants:  PCCM Infectious Diseases  Procedures:  Antimicrobials: Anti-infectives (From admission, onward)    Start     Dose/Rate Route Frequency Ordered Stop   01/25/24 0000  nitazoxanide  (ALINIA ) 500 MG tablet        500 mg Oral Every 12 hours 01/25/24 0925 02/08/24 2359   01/24/24 1045  nitazoxanide  (ALINIA ) tablet 500 mg        500 mg Oral Every 12 hours 01/24/24 0946     01/21/24 1500  dolutegravir  (TIVICAY ) tablet 50 mg        50 mg Oral Daily 01/21/24  1408     01/21/24 1415  emtricitabine -rilpivir-tenofovir  AF (ODEFSEY ) 200-25-25 MG per tablet 1 tablet        1 tablet Oral Daily with breakfast 01/21/24 1408     01/20/24 1930  vancomycin  (VANCOREADY) IVPB 1500 mg/300 mL        1,500 mg 150 mL/hr over 120 Minutes  Intravenous  Once 01/20/24 1923 01/20/24 2158   01/20/24 1415  ceFEPIme  (MAXIPIME ) 2 g in sodium chloride  0.9 % 100 mL IVPB        2 g 200 mL/hr over 30 Minutes Intravenous  Once 01/20/24 1404 01/20/24 1516   01/20/24 1415  vancomycin  (VANCOREADY) IVPB 1500 mg/300 mL  Status:  Discontinued        1,500 mg 150 mL/hr over 120 Minutes Intravenous  Once 01/20/24 1404 01/20/24 1923   01/20/24 1345  metroNIDAZOLE  (FLAGYL ) IVPB 500 mg        500 mg 100 mL/hr over 60 Minutes Intravenous  Once 01/20/24 1339 01/20/24 1613      Subjective: Patient was seen and examined at bedside. Overnight events noted. Patient still reports having diarrhea and abdominal discomfort. Patient remains in contact isolation.  He reports diarrhea is improving.   Objective: Vitals:   01/30/24 0115 01/30/24 0500 01/30/24 0832 01/30/24 1230  BP:  (!) 140/102 (!) 159/92 (!) 142/96  Pulse:  68 66 68  Resp: 16 16 17 18   Temp: 98.3 F (36.8 C) 98.4 F (36.9 C) 97.9 F (36.6 C) 98.3 F (36.8 C)  TempSrc: Oral Oral Oral Oral  SpO2:  96% 97% 98%  Weight:  76.2 kg    Height:        Intake/Output Summary (Last 24 hours) at 01/30/2024 1233 Last data filed at 01/30/2024 0900 Gross per 24 hour  Intake 527 ml  Output --  Net 527 ml   Filed Weights   01/28/24 0503 01/29/24 0341 01/30/24 0500  Weight: 74.8 kg 75.9 kg 76.2 kg    Examination:  General exam: Appears calm and comfortable, not in any acute distress. Respiratory system: CTA Bilaterally . Respiratory effort normal.  RR 13 Cardiovascular system: S1 & S2 heard, RRR. No JVD, murmurs, rubs, gallops or clicks.  Gastrointestinal system: Abdomen is non distended, soft and  Mildly tender.  Normal bowel sounds heard. Central nervous system: Alert and oriented x 3. No focal neurological deficits. Extremities: No edema, no cyanosis, no clubbing. Skin: No rashes, lesions or ulcers Psychiatry: Judgement and insight appear normal. Mood & affect appropriate.     Data Reviewed: I have personally reviewed following labs and imaging studies  CBC: Recent Labs  Lab 01/28/24 0442  WBC 6.6  HGB 11.2*  HCT 34.2*  MCV 93.2  PLT 278   Basic Metabolic Panel: Recent Labs  Lab 01/27/24 0446 01/28/24 0442 01/29/24 0440  NA 140 141  --   K 4.2 3.9  --   CL 112* 112*  --   CO2 17* 20*  --   GLUCOSE 83 81  --   BUN 5* 6  --   CREATININE 1.14 1.32* 1.08  CALCIUM  8.4* 9.1  --   MG 1.6* 2.1  --   PHOS 3.2 3.1  --    GFR: Estimated Creatinine Clearance: 67.9 mL/min (by C-G formula based on SCr of 1.08 mg/dL). Liver Function Tests: Recent Labs  Lab 01/27/24 0446  AST 53*  ALT 33  ALKPHOS 58  BILITOT 0.7  PROT 5.5*  ALBUMIN 2.8*   No results for  input(s): LIPASE, AMYLASE in the last 168 hours.  No results for input(s): AMMONIA in the last 168 hours. Coagulation Profile: No results for input(s): INR, PROTIME in the last 168 hours.  Cardiac Enzymes: No results for input(s): CKTOTAL, CKMB, CKMBINDEX, TROPONINI in the last 168 hours. BNP (last 3 results) No results for input(s): PROBNP in the last 8760 hours. HbA1C: No results for input(s): HGBA1C in the last 72 hours. CBG: No results for input(s): GLUCAP in the last 168 hours.  Lipid Profile: No results for input(s): CHOL, HDL, LDLCALC, TRIG, CHOLHDL, LDLDIRECT in the last 72 hours. Thyroid  Function Tests: No results for input(s): TSH, T4TOTAL, FREET4, T3FREE, THYROIDAB in the last 72 hours. Anemia Panel: No results for input(s): VITAMINB12, FOLATE, FERRITIN, TIBC, IRON, RETICCTPCT in the last 72 hours. Sepsis Labs: No results for input(s): PROCALCITON, LATICACIDVEN in the last 168 hours.   Recent Results (from the past 240 hours)  Culture, blood (routine x 2)     Status: None   Collection Time: 01/20/24 12:37 PM   Specimen: BLOOD  Result Value Ref Range Status   Specimen Description BLOOD SITE NOT SPECIFIED   Final   Special Requests   Final    BOTTLES DRAWN AEROBIC AND ANAEROBIC Blood Culture adequate volume   Culture   Final    NO GROWTH 5 DAYS Performed at [facility], [address] [place], [place] [address]    Report Status 01/25/2024 FINAL  Final  C Difficile Quick Screen w PCR reflex     Status: None   Collection Time: 01/20/24  1:59 PM   Specimen: STOOL  Result Value Ref Range Status   C Diff antigen NEGATIVE NEGATIVE Final   C Diff toxin NEGATIVE NEGATIVE Final   C Diff interpretation No C. difficile detected.  Final    Comment: Performed at [facility], [address], [place], [place] [address]  MRSA Next Gen by PCR, Nasal     Status: None   Collection Time: 01/20/24  9:26 PM   Specimen: Nasal Mucosa; Nasal Swab  Result Value Ref Range Status   MRSA by PCR Next Gen NOT DETECTED NOT DETECTED Final    Comment: (NOTE) The GeneXpert MRSA Assay (FDA approved for NASAL specimens only), is one component of a comprehensive MRSA colonization surveillance program. It is not intended to diagnose MRSA infection nor to guide or monitor treatment for MRSA infections. Test performance is not FDA approved in patients less than 97 years old. Performed at [facility], [address], [place], [place] [address]   Gastrointestinal Panel by PCR , Stool     Status: Abnormal   Collection Time: 01/21/24 10:15 AM   Specimen: Stool  Result Value Ref Range Status   Campylobacter species NOT DETECTED NOT DETECTED Final   Plesimonas shigelloides NOT DETECTED NOT DETECTED Final   Salmonella species NOT DETECTED NOT DETECTED Final   Yersinia enterocolitica NOT DETECTED NOT DETECTED Final   Vibrio species NOT DETECTED NOT DETECTED Final   Vibrio cholerae NOT DETECTED NOT DETECTED Final   Enteroaggregative E coli (EAEC) NOT DETECTED NOT DETECTED Final   Enteropathogenic E coli (EPEC) NOT DETECTED NOT DETECTED Final   Enterotoxigenic E coli (ETEC) NOT DETECTED NOT DETECTED Final    Shiga like toxin producing E coli (STEC) NOT DETECTED NOT DETECTED Final   Shigella/Enteroinvasive E coli (EIEC) NOT DETECTED NOT DETECTED Final   Cryptosporidium DETECTED (A) NOT DETECTED Final   Cyclospora cayetanensis NOT DETECTED  NOT DETECTED Final   Entamoeba histolytica NOT DETECTED NOT DETECTED Final   Giardia lamblia NOT DETECTED NOT DETECTED Final   Adenovirus F40/41 NOT DETECTED NOT DETECTED Final   Astrovirus NOT DETECTED NOT DETECTED Final   Norovirus GI/GII NOT DETECTED NOT DETECTED Final   Rotavirus A NOT DETECTED NOT DETECTED Final   Sapovirus (I, II, IV, and V) NOT DETECTED NOT DETECTED Final    Comment: Performed at [facility], [address] [place], [place] [address]   Radiology Studies: No results found.  Scheduled Meds:  allopurinol   100 mg Oral BID   amiodarone   100 mg Oral Daily   dolutegravir   50 mg Oral Daily   emtricitabine -rilpivir-tenofovir  AF  1 tablet Oral Q breakfast   feeding supplement  237 mL Oral BID BM   gabapentin   300 mg Oral TID   heparin   5,000 Units Subcutaneous Q8H   nitazoxanide   500 mg Oral Q12H   rosuvastatin   10 mg Oral Daily   saccharomyces boulardii  250 mg Oral BID   traZODone   50 mg Oral QHS   Continuous Infusions:  famotidine  (PEPCID ) IV Stopped (01/29/24 2245)     LOS: 10 days    Time spent: 35 mins    Darcel Dawley, MD [facility]   If 7PM-7AM, please contact night-coverage

## 2024-01-30 NOTE — Plan of Care (Signed)
  Problem: Fluid Volume: Goal: Ability to maintain a balanced intake and output will improve Outcome: Progressing   Problem: Health Behavior/Discharge Planning: Goal: Ability to identify and utilize available resources and services will improve Outcome: Progressing   Problem: Metabolic: Goal: Ability to maintain appropriate glucose levels will improve Outcome: Progressing   Problem: Tissue Perfusion: Goal: Adequacy of tissue perfusion will improve Outcome: Progressing

## 2024-01-30 NOTE — Progress Notes (Signed)
 Pt reports he has had NO BM's today. He is passing gas, denies abd pain at this time

## 2024-01-31 DIAGNOSIS — A072 Cryptosporidiosis: Secondary | ICD-10-CM | POA: Diagnosis not present

## 2024-01-31 MED ORDER — FAMOTIDINE 20 MG PO TABS
20.0000 mg | ORAL_TABLET | Freq: Every day | ORAL | Status: DC
  Administered 2024-01-31 – 2024-02-01 (×2): 20 mg via ORAL
  Filled 2024-01-31 (×2): qty 1

## 2024-01-31 MED ORDER — SPIRONOLACTONE 25 MG PO TABS
25.0000 mg | ORAL_TABLET | Freq: Every day | ORAL | Status: DC
  Administered 2024-01-31 – 2024-02-01 (×2): 25 mg via ORAL
  Filled 2024-01-31 (×2): qty 1

## 2024-01-31 MED ORDER — CARVEDILOL 25 MG PO TABS: 25.0000 mg | ORAL_TABLET | Freq: Two times a day (BID) | ORAL | Status: DC

## 2024-01-31 MED ORDER — CARMEX CLASSIC LIP BALM EX OINT
TOPICAL_OINTMENT | CUTANEOUS | Status: DC | PRN
  Filled 2024-01-31: qty 10

## 2024-01-31 MED ORDER — CARVEDILOL 25 MG PO TABS
25.0000 mg | ORAL_TABLET | Freq: Two times a day (BID) | ORAL | Status: DC
Start: 2024-01-31 — End: 2024-02-01
  Administered 2024-01-31 – 2024-02-01 (×3): 25 mg via ORAL
  Filled 2024-01-31 (×3): qty 1

## 2024-01-31 NOTE — Progress Notes (Signed)
 Patient refusing bed alarm to be put  on the bed. Per patient he does not need to be on fall risk because he did not fall the other night, He put himself on the ground to get someone in his room.

## 2024-01-31 NOTE — Progress Notes (Signed)
 Patient refusing  the bed alarm.

## 2024-01-31 NOTE — Progress Notes (Signed)
      I have reviewed notes re him actually having no bowel movements yesterday  His cryptosporidial infection seems to be resolving  He can finish the 10 day course planned for him  I will sign off  Please call with further questions.  Jomarie Fleeta Rothman 01/31/2024, 4:51 PM

## 2024-01-31 NOTE — Progress Notes (Signed)
 Patient refusing bed alarm on the bed.

## 2024-01-31 NOTE — Progress Notes (Signed)
 PROGRESS NOTE    [name]  FMW:[id number] DOB: [birth year] DOA: 01/20/2024 PCP: Campbell Reynolds, NP   Brief Narrative:  This 58 y/o male with PMH significant for well controlled HIV -recent CD4 count 719 and undetectable viral load. 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  Found to have Cryptosporidium positive gastroenteritis in the ICU, blood pressure has since resolved,  no longer requiring pressors and transitioned to medical floor, hospitalist team to pick up on 01/23/2024.   At this time patient appears to be improving, prior AKI likely in setting of profound dehydration and hypovolemia is essentially resolved,  He is now back to baseline.  Infectious disease following, appreciate insight recommendations, tentative plan for discharge in the next 24 to 48 hours pending antibiotics needs and infectious course.  He continues to have loose/soft stool but denies any overt diarrhea.  Nausea/ vomiting resolved.  Assessment & Plan:   Principal Problem:   Cryptosporidial gastroenteritis (HCC) Active Problems:   HIV disease (HCC)   Sepsis (HCC)   Diarrhea   Shock, likely multifactorial,  hypovolemic versus septic, POA: -Secondary to overt GI infection with profound dehydration. -Off Levophed  now > 48 hours -Continue IV fluids, increase p.o. intake as appropriate. -Sepsis criteria met in the setting of tachypnea, leukocytosis with notable source of infection requiring pressors meeting septic shock criteria. -Now sepsis physiology resolved   Acute Gastroenteritis , stool PCR positive for Cryptosporidium: - ID consulted, appreciate insight/recs - not considered immunocompromised. - Plan 2 weeks of nitazoxanide .  He is approved and insurance covered. - Stool occult pos-  Monitor H&H - Continue Tincture of opium  for symptomatic relief. - Patient continued to have diarrhea, started on imodium . - Diarrhea improved.   Acute Kidney Injury  >  Resolved. - Secondary to hypovolemia and ATN. - IV fluids.- Renal functions back to baseline. - Serum creatinine up again. Monitor creatinine.   Syncope, Secondary to low BP: H/o Systolic CHF s/p ICD/Pacemaker: Echo shows mildly decreased LV function 45 to 50% with global hypokinesis,  He has outpatient follow-up with heart failure service.   NAGMA Secondary to diarrhea > Resolved. Avoid hyperchloremic fluids. Hypokalemia and hypophosphatemia repleted -expect to improve with diet advancement.   Well controlled HIV, chronic: Unlikely to be HIV related since his Viral load is undetectable and his CD4 is over 700 Continue home HIV meds Odefsey  and Tivicay  .   H/o Hep C: Has been treated with antiretrovirals. Monitor liver functions.   H/o Neuropathy Resume PO meds when able to take PO.   DVT prophylaxis: Heparin  Code Status: Full code Family Communication: No family at bed side. Disposition Plan: Status is: Inpatient Remains inpatient appropriate because:    Severity of illness  Consultants:  PCCM Infectious Diseases  Procedures:  Antimicrobials: Anti-infectives (From admission, onward)    Start     Dose/Rate Route Frequency Ordered Stop   01/30/24 2000  valACYclovir  (VALTREX ) tablet 500 mg        500 mg Oral 2 times daily 01/30/24 1818     01/25/24 0000  nitazoxanide  (ALINIA ) 500 MG tablet        500 mg Oral Every 12 hours 01/25/24 0925 02/08/24 2359   01/24/24 1045  nitazoxanide  (ALINIA ) tablet 500 mg        500 mg Oral Every 12  hours 01/24/24 0946     01/21/24 1500  dolutegravir  (TIVICAY ) tablet 50 mg        50 mg Oral Daily 01/21/24 1408     01/21/24 1415  emtricitabine -rilpivir-tenofovir  AF (ODEFSEY ) 200-25-25 MG per tablet 1 tablet        1 tablet Oral Daily with breakfast 01/21/24 1408      01/20/24 1930  vancomycin  (VANCOREADY) IVPB 1500 mg/300 mL        1,500 mg 150 mL/hr over 120 Minutes Intravenous  Once 01/20/24 1923 01/20/24 2158   01/20/24 1415  ceFEPIme  (MAXIPIME ) 2 g in sodium chloride  0.9 % 100 mL IVPB        2 g 200 mL/hr over 30 Minutes Intravenous  Once 01/20/24 1404 01/20/24 1516   01/20/24 1415  vancomycin  (VANCOREADY) IVPB 1500 mg/300 mL  Status:  Discontinued        1,500 mg 150 mL/hr over 120 Minutes Intravenous  Once 01/20/24 1404 01/20/24 1923   01/20/24 1345  metroNIDAZOLE  (FLAGYL ) IVPB 500 mg        500 mg 100 mL/hr over 60 Minutes Intravenous  Once 01/20/24 1339 01/20/24 1613      Subjective: Patient was seen and examined at bedside. Overnight events noted. Patient reports diarrhea has improved, has not had bowel movement since morning Patient remains in contact isolation.  His blood pressure is up and started on blood pressure medications   Objective: Vitals:   01/30/24 2006 01/30/24 2358 01/31/24 0516 01/31/24 0826  BP: (!) 166/98 (!) 148/97 (!) 163/96 (!) 175/99  Pulse: 67 65 74 (!) 59  Resp: 15 17 16    Temp: 97.8 F (36.6 C) 98.5 F (36.9 C) 97.9 F (36.6 C) 98.3 F (36.8 C)  TempSrc: Oral Oral Oral Oral  SpO2: 94% 95% 97% 98%  Weight:   76.2 kg   Height:        Intake/Output Summary (Last 24 hours) at 01/31/2024 1153 Last data filed at 01/30/2024 2300 Gross per 24 hour  Intake 50 ml  Output --  Net 50 ml   Filed Weights   01/29/24 0341 01/30/24 0500 01/31/24 0516  Weight: 75.9 kg 76.2 kg 76.2 kg    Examination:  General exam: Appears calm and comfortable, not in any acute distress. Respiratory system: CTA Bilaterally. Respiratory effort normal.  RR 14 Cardiovascular system: S1 & S2 heard, RRR. No JVD, murmurs, rubs, gallops or clicks.  Gastrointestinal system: Abdomen is non distended, soft and  Mildly tender.  Normal bowel sounds heard. Central nervous system: Alert and oriented x 3. No focal neurological  deficits. Extremities: No edema, no cyanosis, no clubbing. Skin: No rashes, lesions or ulcers Psychiatry: Judgement and insight appear normal. Mood & affect appropriate.    Data Reviewed: I have personally reviewed following labs and imaging studies  CBC: Recent Labs  Lab 01/28/24 0442  WBC 6.6  HGB 11.2*  HCT 34.2*  MCV 93.2  PLT 278   Basic Metabolic Panel: Recent Labs  Lab 01/27/24 0446 01/28/24 0442 01/29/24 0440  NA 140 141  --   K 4.2 3.9  --   CL 112* 112*  --   CO2 17* 20*  --   GLUCOSE 83 81  --   BUN 5* 6  --   CREATININE 1.14 1.32* 1.08  CALCIUM  8.4* 9.1  --   MG 1.6* 2.1  --   PHOS 3.2 3.1  --    GFR: Estimated Creatinine Clearance: 67.9 mL/min (by C-G formula  based on SCr of 1.08 mg/dL). Liver Function Tests: Recent Labs  Lab 01/27/24 0446  AST 53*  ALT 33  ALKPHOS 58  BILITOT 0.7  PROT 5.5*  ALBUMIN 2.8*   No results for input(s): LIPASE, AMYLASE in the last 168 hours.  No results for input(s): AMMONIA in the last 168 hours. Coagulation Profile: No results for input(s): INR, PROTIME in the last 168 hours.  Cardiac Enzymes: No results for input(s): CKTOTAL, CKMB, CKMBINDEX, TROPONINI in the last 168 hours. BNP (last 3 results) No results for input(s): PROBNP in the last 8760 hours. HbA1C: No results for input(s): HGBA1C in the last 72 hours. CBG: No results for input(s): GLUCAP in the last 168 hours.  Lipid Profile: No results for input(s): CHOL, HDL, LDLCALC, TRIG, CHOLHDL, LDLDIRECT in the last 72 hours. Thyroid  Function Tests: No results for input(s): TSH, T4TOTAL, FREET4, T3FREE, THYROIDAB in the last 72 hours. Anemia Panel: No results for input(s): VITAMINB12, FOLATE, FERRITIN, TIBC, IRON, RETICCTPCT in the last 72 hours. Sepsis Labs: No results for input(s): PROCALCITON, LATICACIDVEN in the last 168 hours.   No results found for this or any previous visit (from the  past 240 hours).  Radiology Studies: No results found.  Scheduled Meds:  allopurinol   100 mg Oral BID   amiodarone   100 mg Oral Daily   carvedilol   25 mg Oral BID WC   dolutegravir   50 mg Oral Daily   emtricitabine -rilpivir-tenofovir  AF  1 tablet Oral Q breakfast   famotidine   20 mg Oral Daily   feeding supplement  237 mL Oral BID BM   gabapentin   300 mg Oral TID   heparin   5,000 Units Subcutaneous Q8H   nitazoxanide   500 mg Oral Q12H   rosuvastatin   10 mg Oral Daily   saccharomyces boulardii  250 mg Oral BID   spironolactone   25 mg Oral Daily   traZODone   50 mg Oral QHS   valACYclovir   500 mg Oral BID   Continuous Infusions:     LOS: 11 days    Time spent: 35 mins    Darcel Dawley, MD [facility]   If 7PM-7AM, please contact night-coverage

## 2024-01-31 NOTE — Progress Notes (Signed)
 Patient cut off bed alarm to get out of bed. Educated patient on safety. Patient refused for bed alarm on. Manager aware.

## 2024-02-01 ENCOUNTER — Other Ambulatory Visit (HOSPITAL_COMMUNITY): Payer: Self-pay

## 2024-02-01 DIAGNOSIS — A072 Cryptosporidiosis: Secondary | ICD-10-CM | POA: Diagnosis not present

## 2024-02-01 MED ORDER — VALACYCLOVIR HCL 500 MG PO TABS
500.0000 mg | ORAL_TABLET | Freq: Two times a day (BID) | ORAL | 0 refills | Status: AC
  Filled 2024-02-01: qty 10, 5d supply, fill #0

## 2024-02-01 MED ORDER — FAMOTIDINE 20 MG PO TABS
20.0000 mg | ORAL_TABLET | Freq: Every day | ORAL | 0 refills | Status: AC
  Filled 2024-02-01: qty 30, 30d supply, fill #0

## 2024-02-01 MED ORDER — ACIDOPHILUS PO CAPS
1.0000 | ORAL_CAPSULE | Freq: Three times a day (TID) | ORAL | 0 refills | Status: AC
  Filled 2024-02-01: qty 90, 30d supply, fill #0

## 2024-02-01 NOTE — Care Management Important Message (Signed)
 Important Message  Patient Details  Name: [name] MRN: [id number] Date of Birth: February 04, 1966   Important Message Given:  Yes - Medicare IM     Vonzell Arrie Sharps 02/01/2024, 9:36 AM

## 2024-02-01 NOTE — Discharge Summary (Signed)
 Physician Discharge Summary  [name] FMW:[id number] DOB: [birth year] DOA: 01/20/2024  PCP: Campbell Reynolds, NP  Admit date: 01/20/2024  Discharge date: 02/01/2024  Admitted From: Home  Disposition:  Home  Recommendations for Outpatient Follow-up:  Follow up with PCP in 1-2 weeks. Please obtain BMP/CBC in one week. Advised to continue HIV medications as prescribed. Advised to take nitazoxanide  every 12 hours for 10 days. Follow-up infectious diseases as scheduled. Advised to resume Entresto  at follow-up appointment with PCP or cardiology.  Home Health: None Equipment/Devices:None  Discharge Condition: Stable CODE STATUS:Full code Diet recommendation: Heart Healthy   Brief [facility] Hospital Course: This 58 y/o male with PMH significant for well controlled HIV -recent CD4 count 719 and undetectable viral load. 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  Found to have Cryptosporidium positive gastroenteritis, admitted in the ICU, blood pressure has since resolved,  no longer requiring pressors and transitioned to medical floor, hospitalist team to pick up on 01/23/2024.  At this time patient appears to be improving, prior AKI likely in setting of profound dehydration and hypovolemia is essentially resolved,  He is now back to baseline.  Continue to have loose softer stools.  Patient was started on nitazoxanide  for 10 days, slowly patient has made significant improvement diarrhea has resolved.  ID signed off.  Wants to be discharged home.  Patient is being discharged home.   Discharge Diagnoses:  Principal Problem:   Cryptosporidial gastroenteritis (HCC) Active Problems:   HIV disease (HCC)   Sepsis (HCC)   Diarrhea  Shock, likely multifactorial,  hypovolemic versus  septic, POA: -Secondary to overt GI infection with profound dehydration. -Off Levophed  now > 48 hours -Continue IV fluids, increase p.o. intake as appropriate. -Sepsis criteria met in the setting of tachypnea, leukocytosis with notable source of infection requiring pressors meeting septic shock criteria. -Now sepsis physiology resolved   Acute Gastroenteritis , stool PCR positive for Cryptosporidium: - ID consulted, appreciate insight/recs - not considered immunocompromised. - Plan 2 weeks of nitazoxanide .  He is approved and insurance covered. - Stool occult pos- Monitor H&H stable. - Continue Tincture of opium  for symptomatic relief. - Patient continued to have diarrhea, started on imodium . - Diarrhea improved.   Acute Kidney Injury  >  Resolved. - Secondary to hypovolemia and ATN. - IV fluids.- Renal functions back to baseline.   Syncope, Secondary to low BP: H/o Systolic CHF s/p ICD/Pacemaker: Echo shows mildly decreased LV function 45 to 50% with global hypokinesis,  He has outpatient follow-up with heart failure service.   NAGMA Secondary to diarrhea > Resolved. Avoid hyperchloremic fluids. Hypokalemia and hypophosphatemia repleted -expect to improve with diet advancement.   Well controlled HIV, chronic: Unlikely to be HIV related since his Viral load is undetectable and his CD4 is over 700 Continue home HIV meds Odefsey  and Tivicay  .   H/o Hep C: Has been treated with antiretrovirals. Monitor liver functions.   H/o Neuropathy Resume PO meds when able to take PO.  Discharge Instructions  Discharge Instructions     Call MD for:  difficulty breathing, headache or visual disturbances   Complete by: As directed    Call MD for:  persistant dizziness or light-headedness   Complete by: As directed    Call MD for:  persistant nausea and vomiting   Complete  by: As directed    Diet - low sodium heart healthy   Complete by: As directed    Diet general   Complete by: As  directed    Discharge instructions   Complete by: As directed    Advised to follow-up with primary care physician in 1 week. Advised to continue HIV medications as prescribed. Advised to take nitazoxanide  every 12 hours for 10 days. Follow-up infectious diseases as scheduled.   Increase activity slowly   Complete by: As directed       Allergies as of 02/01/2024       Reactions   Bactrim Rash   Sulfamethoxazole -trimethoprim Rash        Medication List     STOP taking these medications    Entresto  97-103 MG Generic drug: sacubitril -valsartan        TAKE these medications    acetaminophen  500 MG tablet Commonly known as: TYLENOL  Take 1,000 mg by mouth every 6 (six) hours as needed for mild pain (pain score 1-3).   Acidophilus Caps capsule Take 1 capsule by mouth 3 (three) times daily with meals.   allopurinol  100 MG tablet Commonly known as: ZYLOPRIM  TAKE 1 TABLET(100 MG) BY MOUTH TWICE DAILY   amiodarone  200 MG tablet Commonly known as: PACERONE  Take 0.5 tablets (100 mg total) by mouth daily.   carvedilol  25 MG tablet Commonly known as: COREG  Take 1 tablet (25 mg total) by mouth 2 (two) times daily with a meal.   CENTRUM SILVER 50+MEN PO Take 1 tablet by mouth daily.   cholecalciferol  25 MCG (1000 UNIT) tablet Commonly known as: VITAMIN D3 Take 1,000 Units by mouth daily.   colchicine  0.6 MG tablet TAKE 1 TABLET(0.6 MG) BY MOUTH TWICE DAILY   empagliflozin  10 MG Tabs tablet Commonly known as: Jardiance  Take 1 tablet (10 mg total) by mouth daily.   famotidine  20 MG tablet Commonly known as: PEPCID  Take 1 tablet (20 mg total) by mouth daily. Start taking on: February 02, 2024 Notes to patient: Needs to be timed 12 hours before or 4 hours after odefsey     Fish Oil 1000 MG Cpdr Take 1,000 mg by mouth daily.   fluticasone  50 MCG/ACT nasal spray Commonly known as: FLONASE  SHAKE LIQUID AND USE 2 SPRAYS IN EACH NOSTRIL DAILY   furosemide  20 MG  tablet Commonly known as: LASIX  Take 1 tablet (20 mg total) by mouth daily.   gabapentin  300 MG capsule Commonly known as: NEURONTIN  Take 300 mg by mouth 3 (three) times daily.   nitazoxanide  500 MG tablet Commonly known as: ALINIA  Take 1 tablet (500 mg total) by mouth every 12 (twelve) hours for 24 doses.   Odefsey  200-25-25 MG Tabs tablet Generic drug: emtricitabine -rilpivir-tenofovir  AF Take 1 tablet by mouth daily.   ondansetron  4 MG tablet Commonly known as: ZOFRAN  Take 4 mg by mouth every 8 (eight) hours as needed for nausea or vomiting.   potassium chloride  SA 20 MEQ tablet Commonly known as: KLOR-CON  M Take 1 tablet (20 mEq total) by mouth daily.   rosuvastatin  10 MG tablet Commonly known as: CRESTOR  Take 1 tablet (10 mg total) by mouth daily.   spironolactone  25 MG tablet Commonly known as: ALDACTONE  Take 1 tablet (25 mg total) by mouth daily.   Tivicay  50 MG tablet Generic drug: dolutegravir  Take 1 tablet (50 mg total) by mouth daily.   traZODone  50 MG tablet Commonly known as: DESYREL  TAKE 1 TABLET(50 MG) BY MOUTH AT BEDTIME   triamcinolone  ointment 0.1 %  Commonly known as: KENALOG  Apply 1 Application topically every other day.   valACYclovir  500 MG tablet Commonly known as: VALTREX  Take 1 tablet (500 mg total) by mouth 2 (two) times daily for 5 days.   Voltaren  Arthritis Pain 1 % Gel Generic drug: diclofenac  Sodium Apply 2 g topically 3 (three) times a week.        Follow-up Information     Campbell Reynolds, NP Follow up in 1 week(s).   Contact information: [address] [place] [address] [phone]         Singh, Mayanka, MD Follow up in 1 week(s).   Specialty: Infectious Diseases Contact information: [address] [place] [address] [phone]                Allergies  Allergen Reactions   Bactrim Rash   Sulfamethoxazole -Trimethoprim Rash    Consultations: Infectious  diseases Gastroenterology   Procedures/Studies: ECHOCARDIOGRAM COMPLETE Result Date: 01/21/2024    ECHOCARDIOGRAM REPORT   Patient Name:   [name] Date of Exam: 01/21/2024 Medical Rec #:  [id number]        Height:       64.0 in Accession #:    [id number]       Weight:       176.4 lb Date of Birth:  Aug 30, 1965        BSA:          1.855 m Patient Age:    58 years         BP:           100/67 mmHg Patient Gender: M                HR:           73 bpm. Exam Location:  Inpatient Procedure: 2D Echo, Color Doppler and Cardiac Doppler (Both Spectral and Color            Flow Doppler were utilized during procedure). Indications:    Shock  History:        Patient has prior history of Echocardiogram examinations, most                 recent 07/23/2023. Sepsis.  Sonographer:    Benard Stallion Referring Phys: [id number] NAVEED SHAH IMPRESSIONS  1. Left ventricular ejection fraction, by estimation, is 45 to 50%. The left ventricle has mildly decreased function. The left ventricle demonstrates global hypokinesis. Left ventricular diastolic parameters are consistent with Grade I diastolic dysfunction (impaired relaxation).  2. Right ventricular systolic function is normal. The right ventricular size is normal. There is normal pulmonary artery systolic pressure.  3. The mitral valve is normal in structure. Trivial mitral valve regurgitation. No evidence of mitral stenosis.  4. The aortic valve is tricuspid. There is mild calcification of the aortic valve. Aortic valve regurgitation is trivial. No aortic stenosis is present.  5. The inferior vena cava is normal in size with greater than 50% respiratory variability, suggesting right atrial pressure of 3 mmHg. Comparison(s): No significant change from prior study. Conclusion(s)/Recommendation(s): Otherwise normal echocardiogram, with minor abnormalities described in the report. FINDINGS  Left Ventricle: Left ventricular ejection fraction, by estimation, is 45 to 50%. The  left ventricle has mildly decreased function. The left ventricle demonstrates global hypokinesis. The left ventricular internal cavity size was normal in size. There is  no left ventricular hypertrophy. Left ventricular diastolic parameters are consistent with Grade I diastolic dysfunction (impaired relaxation). Right Ventricle: The right  ventricular size is normal. No increase in right ventricular wall thickness. Right ventricular systolic function is normal. There is normal pulmonary artery systolic pressure. The tricuspid regurgitant velocity is 2.61 m/s, and  with an assumed right atrial pressure of 3 mmHg, the estimated right ventricular systolic pressure is 30.2 mmHg. Left Atrium: Left atrial size was normal in size. Right Atrium: Right atrial size was normal in size. Pericardium: Trivial pericardial effusion is present. Presence of epicardial fat layer. Mitral Valve: The mitral valve is normal in structure. Trivial mitral valve regurgitation. No evidence of mitral valve stenosis. Tricuspid Valve: The tricuspid valve is normal in structure. Tricuspid valve regurgitation is mild . No evidence of tricuspid stenosis. Aortic Valve: The aortic valve is tricuspid. There is mild calcification of the aortic valve. Aortic valve regurgitation is trivial. No aortic stenosis is present. Aortic valve mean gradient measures 3.0 mmHg. Aortic valve peak gradient measures 5.6 mmHg. Aortic valve area, by VTI measures 2.82 cm. Pulmonic Valve: The pulmonic valve was not well visualized. Pulmonic valve regurgitation is not visualized. No evidence of pulmonic stenosis. Aorta: The aortic root, ascending aorta, aortic arch and descending aorta are all structurally normal, with no evidence of dilitation or obstruction. Venous: The inferior vena cava is normal in size with greater than 50% respiratory variability, suggesting right atrial pressure of 3 mmHg. IAS/Shunts: The atrial septum is grossly normal. Additional Comments: A device  lead is visualized in the right atrium and right ventricle.  LEFT VENTRICLE PLAX 2D LVIDd:         4.70 cm     Diastology LVIDs:         3.20 cm     LV e' medial:    9.68 cm/s LV PW:         0.80 cm     LV E/e' medial:  5.0 LV IVS:        0.80 cm     LV e' lateral:   10.40 cm/s LVOT diam:     2.20 cm     LV E/e' lateral: 4.6 LV SV:         61 LV SV Index:   33 LVOT Area:     3.80 cm  LV Volumes (MOD) LV vol d, MOD A2C: 88.6 ml LV vol d, MOD A4C: 98.4 ml LV vol s, MOD A2C: 55.1 ml LV vol s, MOD A4C: 53.7 ml LV SV MOD A2C:     33.5 ml LV SV MOD A4C:     98.4 ml LV SV MOD BP:      40.5 ml RIGHT VENTRICLE RV Basal diam:  2.50 cm RV Mid diam:    2.50 cm RV S prime:     14.30 cm/s TAPSE (M-mode): 2.7 cm LEFT ATRIUM           Index        RIGHT ATRIUM           Index LA diam:      3.10 cm 1.67 cm/m   RA Area:     13.60 cm LA Vol (A2C): 47.7 ml 25.72 ml/m  RA Volume:   26.70 ml  14.40 ml/m LA Vol (A4C): 32.6 ml 17.58 ml/m  AORTIC VALVE AV Area (Vmax):    2.88 cm AV Area (Vmean):   2.84 cm AV Area (VTI):     2.82 cm AV Vmax:           118.00 cm/s AV Vmean:  78.700 cm/s AV VTI:            0.217 m AV Peak Grad:      5.6 mmHg AV Mean Grad:      3.0 mmHg LVOT Vmax:         89.30 cm/s LVOT Vmean:        58.900 cm/s LVOT VTI:          0.161 m LVOT/AV VTI ratio: 0.74  AORTA Ao Root diam: 3.50 cm MITRAL VALVE               TRICUSPID VALVE MV Area (PHT): 5.27 cm    TR Peak grad:   27.2 mmHg MV Decel Time: 144 msec    TR Vmax:        261.00 cm/s MR Peak grad: 15.2 mmHg MR Vmax:      195.00 cm/s  SHUNTS MV E velocity: 48.00 cm/s  Systemic VTI:  0.16 m MV A velocity: 50.10 cm/s  Systemic Diam: 2.20 cm MV E/A ratio:  0.96 Shelda Bruckner MD Electronically signed by Shelda Bruckner MD Signature Date/Time: 01/21/2024/11:48:07 AM    Final    DG Abd 1 View Result Date: 01/21/2024 CLINICAL DATA:  10056.Diarrhea. EXAM: ABDOMEN - 1 VIEW COMPARISON:  CT without contrast from earlier today. FINDINGS: 11:51 p.m. The  bowel gas pattern is nonobstructive with a general paucity of small bowel aeration, with scattered aeration of the large bowel all the way through. No radio-opaque calculi or other significant radiographic abnormality are seen. IMPRESSION: Nonobstructive bowel gas pattern. No acute radiographic findings or changes. Electronically Signed   By: Francis Quam M.D.   On: 01/21/2024 00:21   CT ABDOMEN PELVIS WO CONTRAST Result Date: 01/20/2024 CLINICAL DATA:  Abdominal pain, acute, nonlocalized. EXAM: CT ABDOMEN AND PELVIS WITHOUT CONTRAST TECHNIQUE: Multidetector CT imaging of the abdomen and pelvis was performed following the standard protocol without IV contrast. RADIATION DOSE REDUCTION: This exam was performed according to the departmental dose-optimization program which includes automated exposure control, adjustment of the mA and/or kV according to patient size and/or use of iterative reconstruction technique. COMPARISON:  CT scan abdomen from 05/17/2018. FINDINGS: Lower chest: There are patchy atelectatic changes in the visualized lung bases. No overt consolidation. There are also moderate sized emphysematous bullae in the bilateral lung bases. No pleural effusion. The heart is normal in size. No pericardial effusion. Hepatobiliary: The liver is normal in size. Non-cirrhotic configuration. No suspicious mass. No intrahepatic or extrahepatic bile duct dilation. There is a single calcified gallstone without imaging signs of acute cholecystitis. Normal gallbladder wall thickness. No pericholecystic inflammatory changes. Pancreas: Unremarkable. No pancreatic ductal dilatation or surrounding inflammatory changes. Spleen: Within normal limits. No focal lesion. Adrenals/Urinary Tract: Adrenal glands are unremarkable. No suspicious renal mass within the limitations of this unenhanced exam. There is a partially exophytic 0.3 x 3.3 cm cyst arising from the left kidney lower pole. No nephroureterolithiasis or  obstructive uropathy. Urinary bladder is under distended, precluding optimal assessment. However, no large mass or stones identified. No perivesical fat stranding. Stomach/Bowel: No disproportionate dilation of the small or large bowel loops. No evidence of abnormal bowel wall thickening or inflammatory changes. The appendix was not visualized; however there is no acute inflammatory process in the right lower quadrant. Vascular/Lymphatic: No ascites or pneumoperitoneum. No abdominal or pelvic lymphadenopathy, by size criteria. No aneurysmal dilation of the major abdominal arteries. There are mild peripheral atherosclerotic vascular calcifications of the aorta and its major branches. Reproductive: Normal size  prostate. Symmetric seminal vesicles. Other: There are small fat containing umbilical and right inguinal hernias. The soft tissues and abdominal wall are otherwise unremarkable. Musculoskeletal: No suspicious osseous lesions. There are mild multilevel degenerative changes in the visualized spine. IMPRESSION: 1. No acute inflammatory process identified within the abdomen or pelvis. 2. Multiple other nonacute observations, as described above. Aortic Atherosclerosis (ICD10-I70.0). Electronically Signed   By: Ree Molt M.D.   On: 01/20/2024 17:40   DG Chest Port 1 View Result Date: 01/20/2024 CLINICAL DATA:  Weakness.  Syncopal episodes.  Emesis and diarrhea. EXAM: PORTABLE CHEST 1 VIEW COMPARISON:  11/17/2022 FINDINGS: Pacemaker/AICD remains in place. Heart size is normal. The lungs are clear. The vascularity is normal. No effusions. IMPRESSION: No active disease. Pacemaker/AICD. Electronically Signed   By: Oneil Officer M.D.   On: 01/20/2024 13:03    Subjective: Patient was seen and examined at bedside.  Overnight events noted. Patient reports feeling much improved and wants to be discharged,  diarrhea has resolved.  Discharge Exam: Vitals:   02/01/24 0538 02/01/24 0909  BP: (!) 148/95 (!) 121/91   Pulse: 75 81  Resp: 18 18  Temp: 98.5 F (36.9 C) 98.1 F (36.7 C)  SpO2: 100%    Vitals:   01/31/24 1933 01/31/24 2346 02/01/24 0538 02/01/24 0909  BP: (!) 141/87 139/89 (!) 148/95 (!) 121/91  Pulse: 76 78 75 81  Resp: 18 16 18 18   Temp: 98.5 F (36.9 C) 98.7 F (37.1 C) 98.5 F (36.9 C) 98.1 F (36.7 C)  TempSrc: Oral Oral Oral Oral  SpO2: 99% 96% 100%   Weight:   76 kg   Height:        General: Pt is alert, awake, not in acute distress Cardiovascular: RRR, S1/S2 +, no rubs, no gallops Respiratory: CTA bilaterally, no wheezing, no rhonchi Abdominal: Soft, NT, ND, bowel sounds + Extremities: no edema, no cyanosis    The results of significant diagnostics from this hospitalization (including imaging, microbiology, ancillary and laboratory) are listed below for reference.     Microbiology: No results found for this or any previous visit (from the past 240 hours).   Labs: BNP (last 3 results) Recent Labs    01/20/24 1222  BNP 16.1   Basic Metabolic Panel: Recent Labs  Lab 01/27/24 0446 01/28/24 0442 01/29/24 0440  NA 140 141  --   K 4.2 3.9  --   CL 112* 112*  --   CO2 17* 20*  --   GLUCOSE 83 81  --   BUN 5* 6  --   CREATININE 1.14 1.32* 1.08  CALCIUM  8.4* 9.1  --   MG 1.6* 2.1  --   PHOS 3.2 3.1  --    Liver Function Tests: Recent Labs  Lab 01/27/24 0446  AST 53*  ALT 33  ALKPHOS 58  BILITOT 0.7  PROT 5.5*  ALBUMIN 2.8*   No results for input(s): LIPASE, AMYLASE in the last 168 hours. No results for input(s): AMMONIA in the last 168 hours. CBC: Recent Labs  Lab 01/28/24 0442  WBC 6.6  HGB 11.2*  HCT 34.2*  MCV 93.2  PLT 278   Cardiac Enzymes: No results for input(s): CKTOTAL, CKMB, CKMBINDEX, TROPONINI in the last 168 hours. BNP: Invalid input(s): POCBNP CBG: No results for input(s): GLUCAP in the last 168 hours. D-Dimer No results for input(s): DDIMER in the last 72 hours. Hgb A1c No results for  input(s): HGBA1C in the last 72 hours. Lipid Profile  No results for input(s): CHOL, HDL, LDLCALC, TRIG, CHOLHDL, LDLDIRECT in the last 72 hours. Thyroid  function studies No results for input(s): TSH, T4TOTAL, T3FREE, THYROIDAB in the last 72 hours.  Invalid input(s): FREET3 Anemia work up No results for input(s): VITAMINB12, FOLATE, FERRITIN, TIBC, IRON, RETICCTPCT in the last 72 hours. Urinalysis    Component Value Date/Time   COLORURINE AMBER (A) 01/20/2024 2249   APPEARANCEUR CLOUDY (A) 01/20/2024 2249   LABSPEC 1.016 01/20/2024 2249   PHURINE 5.0 01/20/2024 2249   GLUCOSEU 50 (A) 01/20/2024 2249   GLUCOSEU NEG mg/dL 90/84/7991 7973   HGBUR MODERATE (A) 01/20/2024 2249   HGBUR negative 09/21/2007 1425   BILIRUBINUR NEGATIVE 01/20/2024 2249   KETONESUR 5 (A) 01/20/2024 2249   PROTEINUR 100 (A) 01/20/2024 2249   UROBILINOGEN 0.2 02/12/2013 1406   NITRITE NEGATIVE 01/20/2024 2249   LEUKOCYTESUR NEGATIVE 01/20/2024 2249   Sepsis Labs Recent Labs  Lab 01/28/24 0442  WBC 6.6   Microbiology No results found for this or any previous visit (from the past 240 hours).   Time coordinating discharge: Over 30 minutes  SIGNED:   Darcel Dawley, MD  [facility] 02/01/2024, 2:11 PM Pager   If 7PM-7AM, please contact night-coverage

## 2024-02-01 NOTE — Discharge Instructions (Signed)
 Advised to follow-up with primary care physician in 1 week. Advised to continue HIV medications as prescribed. Advised to take nitazoxanide  every 12 hours for 10 days. Follow-up infectious diseases as scheduled.

## 2024-02-04 MED ORDER — DOXYCYCLINE HYCLATE 100 MG PO TABS: 100.0000 mg | ORAL_TABLET | Freq: Two times a day (BID) | ORAL | 0 refills | Status: AC

## 2024-02-16 ENCOUNTER — Ambulatory Visit (INDEPENDENT_AMBULATORY_CARE_PROVIDER_SITE_OTHER): Payer: 59

## 2024-02-16 DIAGNOSIS — I428 Other cardiomyopathies: Secondary | ICD-10-CM | POA: Diagnosis not present

## 2024-02-17 LAB — CUP PACEART REMOTE DEVICE CHECK
Battery Remaining Longevity: 48 mo
Battery Voltage: 2.96 V
Brady Statistic AP VP Percent: 2.23 %
Brady Statistic AP VS Percent: 0.03 %
Brady Statistic AS VP Percent: 96.21 %
Brady Statistic AS VS Percent: 1.53 %
Brady Statistic RA Percent Paced: 2.25 %
Brady Statistic RV Percent Paced: 2.4 %
Date Time Interrogation Session: 20250918100816
HighPow Impedance: 79 Ohm
Implantable Lead Connection Status: 753985
Implantable Lead Connection Status: 753985
Implantable Lead Connection Status: 753985
Implantable Lead Implant Date: 20150126
Implantable Lead Implant Date: 20240617
Implantable Lead Implant Date: 20240617
Implantable Lead Location: 753859
Implantable Lead Location: 753860
Implantable Lead Location: 753860
Implantable Lead Model: 3830
Implantable Lead Model: 5076
Implantable Lead Model: 6935
Implantable Pulse Generator Implant Date: 20220826
Lead Channel Impedance Value: 209 Ohm
Lead Channel Impedance Value: 342 Ohm
Lead Channel Impedance Value: 380 Ohm
Lead Channel Impedance Value: 399 Ohm
Lead Channel Impedance Value: 494 Ohm
Lead Channel Impedance Value: 513 Ohm
Lead Channel Pacing Threshold Amplitude: 0.5 V
Lead Channel Pacing Threshold Amplitude: 0.5 V
Lead Channel Pacing Threshold Amplitude: 0.75 V
Lead Channel Pacing Threshold Pulse Width: 0.4 ms
Lead Channel Pacing Threshold Pulse Width: 0.4 ms
Lead Channel Pacing Threshold Pulse Width: 0.6 ms
Lead Channel Sensing Intrinsic Amplitude: 1 mV
Lead Channel Sensing Intrinsic Amplitude: 1 mV
Lead Channel Sensing Intrinsic Amplitude: 7.375 mV
Lead Channel Sensing Intrinsic Amplitude: 7.375 mV
Lead Channel Setting Pacing Amplitude: 1.5 V
Lead Channel Setting Pacing Amplitude: 2 V
Lead Channel Setting Pacing Amplitude: 2.5 V
Lead Channel Setting Pacing Pulse Width: 0.4 ms
Lead Channel Setting Pacing Pulse Width: 0.6 ms
Lead Channel Setting Sensing Sensitivity: 0.3 mV
Zone Setting Status: 755011
Zone Setting Status: 755011

## 2024-02-21 NOTE — Progress Notes (Signed)
Remote ICD Transmission.

## 2024-02-24 ENCOUNTER — Other Ambulatory Visit: Payer: Self-pay

## 2024-02-24 ENCOUNTER — Ambulatory Visit (INDEPENDENT_AMBULATORY_CARE_PROVIDER_SITE_OTHER): Admitting: Internal Medicine

## 2024-02-24 VITALS — BP 118/83 | HR 80 | Temp 98.6°F | Resp 16 | Wt 165.0 lb

## 2024-02-24 DIAGNOSIS — Z21 Asymptomatic human immunodeficiency virus [HIV] infection status: Secondary | ICD-10-CM

## 2024-02-24 DIAGNOSIS — Z23 Encounter for immunization: Secondary | ICD-10-CM

## 2024-02-24 NOTE — Progress Notes (Signed)
 [facility]     HPI: [name] is a 58 y.o. male presents for HIV management. No missed does, not sexaully acitve since lv  Past Medical History:  Diagnosis Date   AICD (automatic cardioverter/defibrillator) present    CHF (congestive heart failure) (HCC)    Chronic systolic heart failure (HCC)    a. Echo 12/05/11:  EF 20-25%, diff HK with mild sparing of IL wall, mild AI, mod MR, mild LAE, mild RVE, mild reduced RVF.;  b.  Echo 05/11/2012:  Mild LVH, EF 20-25%, Gr 1 diast dysfn, mod MR, mild LAE   Depression    G6PD deficiency    GERD (gastroesophageal reflux disease)    Hepatitis    Hep C - treated.   HIV infection (HCC)    Hypertension    NICM (nonischemic cardiomyopathy) (HCC)    cardia CTA 8/13 negative for obstructive CAD   Pneumonia    Presence of permanent cardiac pacemaker    Stroke (HCC) 12/11/2021   VT (ventricular tachycardia) [facility])     Past Surgical History:  Procedure Laterality Date   BI-VENTRICULAR PACEMAKER INSERTION N/A 06/26/2013   Procedure: BI-VENTRICULAR PACEMAKER INSERTION (CRT-P);  Surgeon: Danelle LELON Birmingham, MD;  Location: [facility] CATH LAB;  Service: Cardiovascular;  Laterality: N/A;   BIV ICD GENERATOR CHANGEOUT N/A 01/24/2021   Procedure: BIV ICD GENERATOR CHANGEOUT;  Surgeon: [place] Danelle LELON, MD;  Location: [facility] INVASIVE CV LAB;  Service: Cardiovascular;  Laterality: N/A;   COLONOSCOPY WITH PROPOFOL  N/A 08/14/2016   Procedure: COLONOSCOPY WITH PROPOFOL ;  Surgeon: Belvie Just, MD;  Location: [facility] ENDOSCOPY;  Service: Endoscopy;  Laterality: N/A;   ELECTROPHYSIOLOGY STUDY N/A 02/19/2012   Procedure: ELECTROPHYSIOLOGY STUDY;  Surgeon: Danelle LELON Birmingham, MD;  Location: [facility] CATH LAB;  Service: Cardiovascular;  Laterality: N/A;   ESOPHAGOGASTRODUODENOSCOPY  12/07/2011   Procedure: ESOPHAGOGASTRODUODENOSCOPY (EGD);  Surgeon: Gwendlyn ONEIDA Buddy, MD,FACG;  Location: [facility] ENDOSCOPY;  Service: Endoscopy;  Laterality: N/A;   FINGER SURGERY      Thumb laceration.     HARDWARE REMOVAL Right 07/01/2018   Procedure: HARDWARE REMOVAL;  Surgeon: Beverley Evalene BIRCH, MD;  Location: [facility];  Service: Orthopedics;  Laterality: Right;   INCISION AND DRAINAGE Right 07/01/2018   Procedure: INCISION AND DRAINAGE;  Surgeon: Beverley Evalene BIRCH, MD;  Location: [facility];  Service: Orthopedics;  Laterality: Right;   LEAD EXTRACTION N/A 11/16/2022   Procedure: LEAD EXTRACTION;  Surgeon: [place] Danelle LELON, MD;  Location: [facility] INVASIVE CV LAB;  Service: Cardiovascular;  Laterality: N/A;   LEFT HEART CATHETERIZATION WITH CORONARY ANGIOGRAM N/A 01/31/2014   Procedure: LEFT HEART CATHETERIZATION WITH CORONARY ANGIOGRAM;  Surgeon: Deatrice DELENA Cage, MD;  Location: [facility] CATH LAB;  Service: Cardiovascular;  Laterality: N/A;   ORIF ANKLE FRACTURE Right 01/26/2017   Procedure: OPEN REDUCTION INTERNAL FIXATION (ORIF) ANKLE FRACTURE;  Surgeon: Beverley Evalene BIRCH, MD;  Location: [facility] OR;  Service: Orthopedics;  Laterality: Right;   TEE WITHOUT CARDIOVERSION N/A 11/16/2022   Procedure: TRANSESOPHAGEAL ECHOCARDIOGRAM;  Surgeon: [place] Danelle LELON, MD;  [facility] INVASIVE CV LAB;  Service: Cardiovascular;  Laterality: N/A;    Family History  Problem Relation Age of Onset   Lung cancer Mother    Colon cancer Other 64   Current Outpatient Medications on File Prior to Visit  Medication Sig Dispense Refill   acetaminophen  (TYLENOL ) 500 MG tablet Take 1,000 mg by mouth every 6 (six) hours as needed for  mild pain (pain score 1-3).     allopurinol  (ZYLOPRIM ) 100 MG tablet TAKE 1 TABLET(100 MG) BY MOUTH TWICE DAILY 90 tablet 3   amiodarone  (PACERONE ) 200 MG tablet Take 0.5 tablets (100 mg total) by mouth daily. 45 tablet 3   carvedilol  (COREG ) 25 MG tablet Take 1 tablet (25 mg total) by mouth 2 (two) times daily with a meal. 180 tablet 3   cholecalciferol  (VITAMIN D ) 25 MCG (1000 UNIT) tablet Take 1,000 Units by mouth daily.     colchicine  0.6 MG tablet  TAKE 1 TABLET(0.6 MG) BY MOUTH TWICE DAILY 120 tablet 5   diclofenac  Sodium (VOLTAREN  ARTHRITIS PAIN) 1 % GEL Apply 2 g topically 3 (three) times a week.     dolutegravir  (TIVICAY ) 50 MG tablet Take 1 tablet (50 mg total) by mouth daily. 30 tablet 4   doxycycline  (VIBRA -TABS) 100 MG tablet Take 1 tablet (100 mg total) by mouth 2 (two) times daily. 20 tablet 0   empagliflozin  (JARDIANCE ) 10 MG TABS tablet Take 1 tablet (10 mg total) by mouth daily. 90 tablet 3   emtricitabine -rilpivir-tenofovir  AF (ODEFSEY ) 200-25-25 MG TABS tablet Take 1 tablet by mouth daily. 30 tablet 4   famotidine  (PEPCID ) 20 MG tablet Take 1 tablet (20 mg total) by mouth daily. 30 tablet 0   fluticasone  (FLONASE ) 50 MCG/ACT nasal spray SHAKE LIQUID AND USE 2 SPRAYS IN EACH NOSTRIL DAILY 16 g 6   furosemide  (LASIX ) 20 MG tablet Take 1 tablet (20 mg total) by mouth daily. 90 tablet 3   gabapentin  (NEURONTIN ) 300 MG capsule Take 300 mg by mouth 3 (three) times daily.     Lactobacillus (ACIDOPHILUS) CAPS capsule Take 1 capsule by mouth 3 (three) times daily with meals. 90 capsule 0   Multiple Vitamins-Minerals (CENTRUM SILVER 50+MEN PO) Take 1 tablet by mouth daily.     Omega-3 Fatty Acids (FISH OIL) 1000 MG CPDR Take 1,000 mg by mouth daily.     ondansetron  (ZOFRAN ) 4 MG tablet Take 4 mg by mouth every 8 (eight) hours as needed for nausea or vomiting.  0   potassium chloride  SA (KLOR-CON  M) 20 MEQ tablet Take 1 tablet (20 mEq total) by mouth daily. 90 tablet 3   rosuvastatin  (CRESTOR ) 10 MG tablet Take 1 tablet (10 mg total) by mouth daily. 90 tablet 3   spironolactone  (ALDACTONE ) 25 MG tablet Take 1 tablet (25 mg total) by mouth daily. 90 tablet 3   traZODone  (DESYREL ) 50 MG tablet TAKE 1 TABLET(50 MG) BY MOUTH AT BEDTIME 30 tablet 2   triamcinolone  ointment (KENALOG ) 0.1 % Apply 1 Application topically every other day.     No current facility-administered medications on file prior to visit.    Allergies  Allergen Reactions    Bactrim Rash   Sulfamethoxazole -Trimethoprim Rash      Lab Results HIV 1 RNA Quant  Date Value  11/10/2023 NOT DETECTED copies/mL  06/11/2023 Not Detected Copies/mL  11/05/2022 Not Detected Copies/mL   CD4 T Cell Abs (/uL)  Date Value  11/05/2022 590  05/19/2022 826  11/04/2021 492   No results found for: HIV1GENOSEQ Lab Results  Component Value Date   WBC 6.6 01/28/2024   HGB 11.2 (L) 01/28/2024   HCT 34.2 (L) 01/28/2024   MCV 93.2 01/28/2024   PLT 278 01/28/2024    Lab Results  Component Value Date   CREATININE 1.08 01/29/2024   BUN 6 01/28/2024   NA 141 01/28/2024   K 3.9 01/28/2024  CL 112 (H) 01/28/2024   CO2 20 (L) 01/28/2024   Lab Results  Component Value Date   ALT 33 01/27/2024   AST 53 (H) 01/27/2024   GGT 283 (H) 04/28/2017   ALKPHOS 58 01/27/2024   BILITOT 0.7 01/27/2024    Lab Results  Component Value Date   CHOL 209 (H) 11/10/2023   TRIG 236 (H) 11/10/2023   HDL 97 11/10/2023   LDLCALC 79 11/10/2023   No results found for: HAV Lab Results  Component Value Date   HEPBSAG No 07/26/2006   HEPBSAB Yes 07/26/2006   Lab Results  Component Value Date   HCVAB Yes 07/26/2006   Lab Results  Component Value Date   CHLAMYDIAWP Negative 05/19/2022   N Negative 05/19/2022   No results found for: GCPROBEAPT No results found for: QUANTGOLD  Assessment/Plan #HIV VL ND on 6/11 on biktarvy - HIV labs today on doefsey and tivicay  - Follow-up in 6 months.  Hiv labs #Cryptosporidium diarrhea - Treated with 10 days of antibiotics.nitazoxanide   - Diarrhea resolved.  #Vaccination COVID Flu Monkeypox PCV 20 in 2023 Meningitis HepA x 2 doses in 2011, serology today HEpB-immune Tdap 2017 Shingles  #Health maintenance -Quantiferon today -RPR nr 06/11/23 -HCV today -GC 06/11/23 -Lipid The ASCVD Risk score (Arnett DK, et al., 2019) failed to calculate for the following reasons:   Risk score cannot be calculated because patient  has a medical history suggesting prior/existing ASCVD -Dysplasia screen F/M -Mammogram  -Colonoscopy 2018->f/u in one year    Loney Stank, MD [facility] I have personally spent 52 minutes involved in face-to-face and non-face-to-face activities for this patient on the day of the visit. Professional time spent includes the following activities: Preparing to see the patient (review of tests), Obtaining and/or reviewing separately obtained history (admission/discharge record), Performing a medically appropriate examination and/or evaluation , Ordering medications/tests/procedures, referring and communicating with other health care professionals, Documenting clinical information in the EMR, Independently interpreting results (not separately reported), Communicating results to the patient/family/caregiver, Counseling and educating the patient/family/caregiver and Care coordination (not separately reported).

## 2024-02-25 LAB — T-HELPER CELLS (CD4) COUNT (NOT AT ARMC)
CD4 % Helper T Cell: 39 % (ref 33–65)
CD4 T Cell Abs: 1024 /uL (ref 400–1790)

## 2024-02-26 ENCOUNTER — Ambulatory Visit: Payer: Self-pay | Admitting: Internal Medicine

## 2024-03-04 LAB — COMPREHENSIVE METABOLIC PANEL WITH GFR
AG Ratio: 1.7 (calc) (ref 1.0–2.5)
ALT: 23 U/L (ref 9–46)
AST: 26 U/L (ref 10–35)
Albumin: 5.1 g/dL (ref 3.6–5.1)
Alkaline phosphatase (APISO): 75 U/L (ref 35–144)
BUN: 15 mg/dL (ref 7–25)
CO2: 22 mmol/L (ref 20–32)
Calcium: 10.2 mg/dL (ref 8.6–10.3)
Chloride: 104 mmol/L (ref 98–110)
Creat: 1.05 mg/dL (ref 0.70–1.35)
Globulin: 3 g/dL (ref 1.9–3.7)
Glucose, Bld: 103 mg/dL — ABNORMAL HIGH (ref 65–99)
Potassium: 4.1 mmol/L (ref 3.5–5.3)
Sodium: 139 mmol/L (ref 135–146)
Total Bilirubin: 0.5 mg/dL (ref 0.2–1.2)
Total Protein: 8.1 g/dL (ref 6.1–8.1)
eGFR: 81 mL/min/1.73m2 (ref >=60)

## 2024-03-04 LAB — CBC WITH DIFFERENTIAL/PLATELET
Absolute Lymphocytes: 2948 {cells}/uL (ref 850–3900)
Absolute Monocytes: 726 {cells}/uL (ref 200–950)
Basophils Absolute: 55 {cells}/uL (ref 0–200)
Basophils Relative: 0.5 %
Eosinophils Absolute: 143 {cells}/uL (ref 15–500)
Eosinophils Relative: 1.3 %
HCT: 42.3 % (ref 38.5–50.0)
Hemoglobin: 14.1 g/dL (ref 13.2–17.1)
MCH: 30.3 pg (ref 27.0–33.0)
MCHC: 33.3 g/dL (ref 32.0–36.0)
MCV: 90.8 fL (ref 80.0–100.0)
MPV: 10.4 fL (ref 7.5–12.5)
Monocytes Relative: 6.6 %
Neutro Abs: 7128 {cells}/uL (ref 1500–7800)
Neutrophils Relative %: 64.8 %
Platelets: 226 Thousand/uL (ref 140–400)
RBC: 4.66 Million/uL (ref 4.20–5.80)
RDW: 13.1 % (ref 11.0–15.0)
Total Lymphocyte: 26.8 %
WBC: 11 Thousand/uL — ABNORMAL HIGH (ref 3.8–10.8)

## 2024-03-04 LAB — HCV RNA,QUANTITATIVE REAL TIME PCR
HCV Quantitative Log: <1.18 {Log_IU}/mL
HCV RNA, PCR, QN: <15 [IU]/mL

## 2024-03-04 LAB — HIV-1 RNA QUANT-NO REFLEX-BLD
HIV 1 RNA Quant: NOT DETECTED {copies}/mL
HIV-1 RNA Quant, Log: NOT DETECTED {Log_copies}/mL

## 2024-03-04 LAB — QUANTIFERON-TB GOLD PLUS
Mitogen-NIL: 7.81 [IU]/mL
NIL: 0.02 [IU]/mL
QuantiFERON-TB Gold Plus: NEGATIVE
TB1-NIL: 0 [IU]/mL
TB2-NIL: 0 [IU]/mL

## 2024-03-04 LAB — HEPATITIS A ANTIBODY, TOTAL: Hepatitis A AB,Total: NONREACTIVE

## 2024-03-04 LAB — HEPATITIS C ANTIBODY: Hepatitis C Ab: REACTIVE — AB

## 2024-03-07 ENCOUNTER — Telehealth: Payer: Self-pay

## 2024-03-07 DIAGNOSIS — Z21 Asymptomatic human immunodeficiency virus [HIV] infection status: Secondary | ICD-10-CM

## 2024-03-07 MED ORDER — TIVICAY 50 MG PO TABS
50.0000 mg | ORAL_TABLET | Freq: Every day | ORAL | 1 refills | Status: DC
Start: 2024-03-07 — End: 2024-03-30

## 2024-03-07 MED ORDER — ODEFSEY 200-25-25 MG PO TABS: 1.0000 | ORAL_TABLET | Freq: Every day | ORAL | 1 refills | Status: DC

## 2024-03-07 NOTE — Telephone Encounter (Signed)
 Received voicemail from patient requesting 90 day supply of Tivicay  and Odefsey . Called him back, confirmed he would like these sent to [facility] on [address].   Andelyn Spade, BSN, RN

## 2024-03-07 NOTE — Telephone Encounter (Signed)
 DDI with famotidine , reviewed by Cathlyn July, NP. Okay to continue famotidine  as long as it is spaced out from Odefsey . Tried calling Paul back to review DDI, no answer. Left HIPAA compliant voicemail requesting callback. Additional information added to Odefsey  instructions.   Danaya Geddis, BSN, RN

## 2024-03-07 NOTE — Telephone Encounter (Signed)
 [place] returned call, confirms he is taking famotidine , but only as needed. Discussed that he should take this at least 6 hours apart from his Odefsey . Patient verbalized understanding and has no further questions.   Jerrick Farve, BSN, RN

## 2024-03-17 ENCOUNTER — Other Ambulatory Visit (HOSPITAL_COMMUNITY): Payer: Self-pay

## 2024-03-22 ENCOUNTER — Ambulatory Visit: Admitting: Internal Medicine

## 2024-03-22 ENCOUNTER — Ambulatory Visit: Attending: Cardiology | Admitting: Internal Medicine

## 2024-03-22 ENCOUNTER — Encounter: Payer: Self-pay | Admitting: Internal Medicine

## 2024-03-22 VITALS — BP 135/91 | HR 88 | Ht 64.0 in | Wt 168.3 lb

## 2024-03-22 DIAGNOSIS — I472 Ventricular tachycardia, unspecified: Secondary | ICD-10-CM | POA: Diagnosis not present

## 2024-03-22 NOTE — Progress Notes (Signed)
 HPI [name] returns today for followup. He is a pleasant middle aged man with chronic systolic heart failure and VT, prior ETOH abuse, s/p ICD insertion. His VT has been well controlled on amiodarone  100 mg daily and with cessation of ETOH abuse. He has not had syncope. He admits to dietary indiscretion and has not been working out. He has gained weight but then has lost most of it back. He denies chest pain or sob. He was found to have an elevated shocking impedence on his ICD lead and underwent extraction and re-insertion of a new device. He was hospitalized with hypotension and cryptosporidium enteritis. He feels better.  Allergies  Allergen Reactions   Bactrim Rash   Sulfamethoxazole -Trimethoprim Rash     Current Outpatient Medications  Medication Sig Dispense Refill   acetaminophen  (TYLENOL ) 500 MG tablet Take 1,000 mg by mouth every 6 (six) hours as needed for mild pain (pain score 1-3).     allopurinol  (ZYLOPRIM ) 100 MG tablet TAKE 1 TABLET(100 MG) BY MOUTH TWICE DAILY 90 tablet 3   amiodarone  (PACERONE ) 200 MG tablet Take 0.5 tablets (100 mg total) by mouth daily. 45 tablet 3   carvedilol  (COREG ) 25 MG tablet Take 1 tablet (25 mg total) by mouth 2 (two) times daily with a meal. 180 tablet 3   cholecalciferol  (VITAMIN D ) 25 MCG (1000 UNIT) tablet Take 1,000 Units by mouth daily.     colchicine  0.6 MG tablet TAKE 1 TABLET(0.6 MG) BY MOUTH TWICE DAILY 120 tablet 5   diclofenac  Sodium (VOLTAREN  ARTHRITIS PAIN) 1 % GEL Apply 2 g topically 3 (three) times a week.     dolutegravir  (TIVICAY ) 50 MG tablet Take 1 tablet (50 mg total) by mouth daily. 90 tablet 1   doxycycline  (VIBRA -TABS) 100 MG tablet Take 1 tablet (100 mg total) by mouth 2 (two) times daily. 20 tablet 0   empagliflozin  (JARDIANCE ) 10 MG TABS tablet Take 1 tablet (10 mg total) by mouth daily. 90 tablet 3   emtricitabine -rilpivir-tenofovir  AF (ODEFSEY ) 200-25-25 MG TABS tablet Take 1 tablet by mouth daily. Please take  at least 6 hours apart from famotidine  90 tablet 1   famotidine  (PEPCID ) 20 MG tablet Take 1 tablet (20 mg total) by mouth daily. 30 tablet 0   fluticasone  (FLONASE ) 50 MCG/ACT nasal spray SHAKE LIQUID AND USE 2 SPRAYS IN EACH NOSTRIL DAILY 16 g 6   furosemide  (LASIX ) 20 MG tablet Take 1 tablet (20 mg total) by mouth daily. 90 tablet 3   gabapentin  (NEURONTIN ) 300 MG capsule Take 300 mg by mouth 3 (three) times daily.     Multiple Vitamins-Minerals (CENTRUM SILVER 50+MEN PO) Take 1 tablet by mouth daily.     Omega-3 Fatty Acids (FISH OIL) 1000 MG CPDR Take 1,000 mg by mouth daily.     ondansetron  (ZOFRAN ) 4 MG tablet Take 4 mg by mouth every 8 (eight) hours as needed for nausea or vomiting.  0   potassium chloride  SA (KLOR-CON  M) 20 MEQ tablet Take 1 tablet (20 mEq total) by mouth daily. 90 tablet 3   rosuvastatin  (CRESTOR ) 10 MG tablet Take 1 tablet (10 mg total) by mouth daily. 90 tablet 3   spironolactone  (ALDACTONE ) 25 MG tablet Take 1 tablet (25 mg total) by mouth daily. 90 tablet 3   traZODone  (DESYREL ) 50 MG tablet TAKE 1 TABLET(50 MG) BY MOUTH AT BEDTIME 30 tablet 2   triamcinolone  ointment (KENALOG ) 0.1 % Apply 1 Application topically every other day.  No current facility-administered medications for this visit.     Past Medical History:  Diagnosis Date   AICD (automatic cardioverter/defibrillator) present    CHF (congestive heart failure) (HCC)    Chronic systolic heart failure (HCC)    a. Echo 12/05/11:  EF 20-25%, diff HK with mild sparing of IL wall, mild AI, mod MR, mild LAE, mild RVE, mild reduced RVF.;  b.  Echo 05/11/2012:  Mild LVH, EF 20-25%, Gr 1 diast dysfn, mod MR, mild LAE   Depression    G6PD deficiency    GERD (gastroesophageal reflux disease)    Hepatitis    Hep C - treated.   HIV infection (HCC)    Hypertension    NICM (nonischemic cardiomyopathy) (HCC)    cardia CTA 8/13 negative for obstructive CAD   Pneumonia    Presence of permanent cardiac pacemaker     Stroke (HCC) 12/11/2021   VT (ventricular tachycardia) (HCC)     ROS:   All systems reviewed and negative except as noted in the HPI.   Past Surgical History:  Procedure Laterality Date   BI-VENTRICULAR PACEMAKER INSERTION N/A 06/26/2013   Procedure: BI-VENTRICULAR PACEMAKER INSERTION (CRT-P);  Surgeon: Danelle LELON Birmingham, MD;  Location: [facility] CATH LAB;  Service: Cardiovascular;  Laterality: N/A;   BIV ICD GENERATOR CHANGEOUT N/A 01/24/2021   Procedure: BIV ICD GENERATOR CHANGEOUT;  Surgeon: [place] Danelle LELON, MD;  Location: [facility] INVASIVE CV LAB;  Service: Cardiovascular;  Laterality: N/A;   COLONOSCOPY WITH PROPOFOL  N/A 08/14/2016   Procedure: COLONOSCOPY WITH PROPOFOL ;  Surgeon: Belvie Just, MD;  Location: [facility] ENDOSCOPY;  Service: Endoscopy;  Laterality: N/A;   ELECTROPHYSIOLOGY STUDY N/A 02/19/2012   Procedure: ELECTROPHYSIOLOGY STUDY;  Surgeon: Danelle LELON Birmingham, MD;  Location: [facility] CATH LAB;  Service: Cardiovascular;  Laterality: N/A;   ESOPHAGOGASTRODUODENOSCOPY  12/07/2011   Procedure: ESOPHAGOGASTRODUODENOSCOPY (EGD);  Surgeon: Gwendlyn ONEIDA Buddy, MD,FACG;  Location: [facility] ENDOSCOPY;  Service: Endoscopy;  Laterality: N/A;   FINGER SURGERY     Thumb laceration.     HARDWARE REMOVAL Right 07/01/2018   Procedure: HARDWARE REMOVAL;  Surgeon: Beverley Evalene BIRCH, MD;  Location: [facility];  Service: Orthopedics;  Laterality: Right;   INCISION AND DRAINAGE Right 07/01/2018   Procedure: INCISION AND DRAINAGE;  Surgeon: Beverley Evalene BIRCH, MD;  Location: [facility];  Service: Orthopedics;  Laterality: Right;   LEAD EXTRACTION N/A 11/16/2022   Procedure: LEAD EXTRACTION;  Surgeon: [place] Danelle LELON, MD;  Location: [facility] INVASIVE CV LAB;  Service: Cardiovascular;  Laterality: N/A;   LEFT HEART CATHETERIZATION WITH CORONARY ANGIOGRAM N/A 01/31/2014   Procedure: LEFT HEART CATHETERIZATION WITH CORONARY ANGIOGRAM;  Surgeon: Deatrice DELENA Cage, MD;  Location: [facility] CATH LAB;  Service: Cardiovascular;   Laterality: N/A;   ORIF ANKLE FRACTURE Right 01/26/2017   Procedure: OPEN REDUCTION INTERNAL FIXATION (ORIF) ANKLE FRACTURE;  Surgeon: Beverley Evalene BIRCH, MD;  Location: [facility] OR;  Service: Orthopedics;  Laterality: Right;   TEE WITHOUT CARDIOVERSION N/A 11/16/2022   Procedure: TRANSESOPHAGEAL ECHOCARDIOGRAM;  Surgeon: [place] Danelle LELON, MD;  Location: [facility] INVASIVE CV LAB;  Service: Cardiovascular;  Laterality: N/A;     Family History  Problem Relation Age of Onset   Lung cancer Mother    Colon cancer Other 42     Social History   Socioeconomic History   Marital status: Single    Spouse name: Not on file   Number of children: Not on file   Years of education: Not on file  Highest education level: Not on file  Occupational History   Occupation: Unemployed  Tobacco Use   Smoking status: Never   Smokeless tobacco: Never  Vaping Use   Vaping status: Never Used  Substance and Sexual Activity   Alcohol use: Yes    Alcohol/week: 4.0 standard drinks of alcohol    Types: 4 Cans of beer per week    Comment: occ beer   Drug use: Not Currently    Types: Marijuana    Comment: accepted condoms   Sexual activity: Yes    Partners: Male    Comment: declined condoms  Other Topics Concern   Not on file  Social History Narrative   Lives alone.  Drinks beer daily.  Liquor rarely.  He occasionally smokes marijuana..   Social Drivers of Health   Financial Resource Strain: High Risk (02/05/2022)   Received from [facility]   Overall Financial Resource Strain (CARDIA)    Difficulty of Paying Living Expenses: Very hard  Food Insecurity: No Food Insecurity (01/20/2024)   Hunger Vital Sign    Worried About Running Out of Food in the Last Year: Never true    Ran Out of Food in the Last Year: Never true  Transportation Needs: No Transportation Needs (01/20/2024)   PRAPARE - Administrator, Civil Service (Medical): No    Lack of Transportation (Non-Medical): No  Physical Activity: Not  on file  Stress: Not on file  Social Connections: Not on file  Intimate Partner Violence: Not At Risk (01/20/2024)   Humiliation, Afraid, Rape, and Kick questionnaire    Fear of Current or Ex-Partner: No    Emotionally Abused: No    Physically Abused: No    Sexually Abused: No     BP (!) 135/91   Pulse 88   Ht 5' 4 (1.626 m)   Wt 168 lb 4.8 oz (76.3 kg)   SpO2 96%   BMI 28.89 kg/m   Physical Exam:  Well appearing NAD HEENT: Unremarkable Neck:  No JVD, no thyromegally Lymphatics:  No adenopathy Back:  No CVA tenderness Lungs:  Clear HEART:  Regular rate rhythm, no murmurs, no rubs, no clicks Abd:  soft, positive bowel sounds, no organomegally, no rebound, no guarding Ext:  2 plus pulses, no edema, no cyanosis, no clubbing Skin:  No rashes no nodules Neuro:  CN II through XII intact, motor grossly intact  DEVICE  Normal device function.  See PaceArt for details.   Assess/Plan:  1.Chronic systolic heart failure - his symptoms remain class 2. He will continue his current meds. His optivol is back down and I asked him to continue his current meds.  2. VT - he has maintained NSR on low dose amiodarone   3. ETOH abuse - he has not consumed excess ETOH. He drinks rarely. I encouraged him to never drink more than 2 beers a day or 7 in a week. 4. ICD - He is s/p medtronic Biv ICD revision with removal of his old ICD lead which had developed and elevated shocking impedence.  He has had a left bundle lead inserted as well.   Danelle Frankee Gritz,MD

## 2024-03-22 NOTE — Patient Instructions (Signed)

## 2024-03-29 ENCOUNTER — Other Ambulatory Visit: Payer: Self-pay | Admitting: Family

## 2024-03-29 DIAGNOSIS — Z21 Asymptomatic human immunodeficiency virus [HIV] infection status: Secondary | ICD-10-CM

## 2024-03-30 ENCOUNTER — Other Ambulatory Visit: Payer: Self-pay | Admitting: Nurse Practitioner

## 2024-03-30 DIAGNOSIS — K7469 Other cirrhosis of liver: Secondary | ICD-10-CM

## 2024-04-03 ENCOUNTER — Encounter: Payer: Self-pay | Admitting: Radiology

## 2024-04-06 ENCOUNTER — Ambulatory Visit
Admission: RE | Admit: 2024-04-06 | Discharge: 2024-04-06 | Disposition: A | Source: Ambulatory Visit | Attending: Nurse Practitioner

## 2024-04-06 DIAGNOSIS — K7469 Other cirrhosis of liver: Secondary | ICD-10-CM

## 2024-05-01 ENCOUNTER — Ambulatory Visit: Admitting: Family

## 2024-05-01 ENCOUNTER — Encounter: Payer: Self-pay | Admitting: Family

## 2024-05-01 ENCOUNTER — Other Ambulatory Visit: Payer: Self-pay

## 2024-05-01 VITALS — BP 154/98 | HR 82 | Temp 98.1°F | Resp 16 | Wt 174.4 lb

## 2024-05-01 DIAGNOSIS — Z79899 Other long term (current) drug therapy: Secondary | ICD-10-CM | POA: Diagnosis not present

## 2024-05-01 DIAGNOSIS — Z21 Asymptomatic human immunodeficiency virus [HIV] infection status: Secondary | ICD-10-CM | POA: Diagnosis not present

## 2024-05-01 DIAGNOSIS — K7460 Unspecified cirrhosis of liver: Secondary | ICD-10-CM | POA: Diagnosis not present

## 2024-05-01 DIAGNOSIS — Z0000 Encounter for general adult medical examination without abnormal findings: Secondary | ICD-10-CM

## 2024-05-01 MED ORDER — TIVICAY 50 MG PO TABS: 50.0000 mg | ORAL_TABLET | Freq: Every day | ORAL | 1 refills | Status: AC

## 2024-05-01 MED ORDER — ODEFSEY 200-25-25 MG PO TABS: 1.0000 | ORAL_TABLET | Freq: Every day | ORAL | 1 refills | Status: AC

## 2024-05-01 NOTE — Patient Instructions (Addendum)
Nice to see you. ° °Continue to take your medication daily as prescribed. ° °Refills have been sent to the pharmacy. ° °Plan for follow up in 6 months or sooner if needed with lab work on the same day. ° °Have a great day and stay safe! ° °

## 2024-05-01 NOTE — Progress Notes (Addendum)
 Patient: [name]  DOB: 12/06/1965 MRN: [id number] PCP: Campbell Reynolds, NP  Referring Provider:   Brief Narrative    Mr. Ketron is a 59 y/o AA male diagnosed with HIV disease on 05/01/97 with risk factor of MSM. Initial viral load was 250,001 with CD4 count 120. Entered care at [facility] Stage 3. Genotype with M184V (emtricitabine /lamivudine). No history of opportunistic infection. Treated Hepatitis C. ART experienced with darunavir /r/emtricitabine /tenofovir  disoproxal fumerate,  etravirine /emtricitabine /tenofovir  disoproxil fumerate; Complara; and Odefsey /Tivicay . Reason for Visit: 65-month follow up  Chief Complaint  Patient presents with   Follow-up    b20      Subjective   Subjective:   [name] is a 58 year old male being seen in clinic today for 31-month follow up on his HIV care. Last seen in clinic on 11/10/2023 since than he was hospitalized from 01/20/2024-02/01/2024 for water diarrhea, nausea/vomiting, and fevers. He was treated for Cryptosporidium positive gastroenteritis. Since being discharged, he reports normal bowel movements and denies nausea/vomiting, and fevers. Current ART regimen includes daily Odefsey  and Tivicay . He reports daily adherence, no missed doses, no adverse effects. He continues to take Odefsey  with food and reports no difficulty falling or staying asleep with Tivicay . Last HIV-1 RNA was not detected and CD4 1024. Kidney function, liver, function and electrolytes from 02/24/24 were within normal ranges. Recently his case manager from [facility] told him that it would better to obtain 90-day supply of both Odefsey  and Tivicay  but is only allowed to get 90-supply of one medication and 30-day supply of the other. He is unsure of which one it was as this conversation was held in September. He states he is not sexually active at this time and declines STI screening. He denies the use alcohol or other substance use. He reports stable housing, food, and no  financial barriers.  Working part time as a social worker. He reports having a dental appointment tomorrow. He denies fevers, chills, night sweats, chest pain, and shortness of breath.   Review of Systems  Constitutional: Negative.   Respiratory: Negative.    Cardiovascular: Negative.   Gastrointestinal: Negative.   Genitourinary: Negative.   Musculoskeletal: Negative.   Skin: Negative.   Neurological: Negative.   Psychiatric/Behavioral: Negative.      Past Medical History:  Diagnosis Date   AICD (automatic cardioverter/defibrillator) present    CHF (congestive heart failure) (HCC)    Chronic systolic heart failure (HCC)    a. Echo 12/05/11:  EF 20-25%, diff HK with mild sparing of IL wall, mild AI, mod MR, mild LAE, mild RVE, mild reduced RVF.;  b.  Echo 05/11/2012:  Mild LVH, EF 20-25%, Gr 1 diast dysfn, mod MR, mild LAE   Depression    G6PD deficiency    GERD (gastroesophageal reflux disease)    Hepatitis    Hep C - treated.   HIV infection (HCC)    Hypertension    NICM (nonischemic cardiomyopathy) (HCC)    cardia CTA 8/13 negative for obstructive CAD   Pneumonia    Presence of permanent cardiac pacemaker    Stroke (HCC) 12/11/2021   VT (ventricular tachycardia) (HCC)     Outpatient Medications Prior to Visit  Medication Sig Dispense Refill   acetaminophen  (TYLENOL ) 500 MG tablet Take 1,000 mg by mouth every 6 (six) hours as needed for mild pain (pain score 1-3).     allopurinol  (ZYLOPRIM ) 100 MG tablet TAKE 1 TABLET(100 MG) BY MOUTH TWICE DAILY 90 tablet 3   amiodarone  (  PACERONE ) 200 MG tablet Take 0.5 tablets (100 mg total) by mouth daily. 45 tablet 3   carvedilol  (COREG ) 25 MG tablet Take 1 tablet (25 mg total) by mouth 2 (two) times daily with a meal. 180 tablet 3   cholecalciferol  (VITAMIN D ) 25 MCG (1000 UNIT) tablet Take 1,000 Units by mouth daily.     colchicine  0.6 MG tablet TAKE 1 TABLET(0.6 MG) BY MOUTH TWICE DAILY 120 tablet 5   diclofenac  Sodium (VOLTAREN   ARTHRITIS PAIN) 1 % GEL Apply 2 g topically 3 (three) times a week.     doxycycline  (VIBRA -TABS) 100 MG tablet Take 1 tablet (100 mg total) by mouth 2 (two) times daily. 20 tablet 0   empagliflozin  (JARDIANCE ) 10 MG TABS tablet Take 1 tablet (10 mg total) by mouth daily. 90 tablet 3   famotidine  (PEPCID ) 20 MG tablet Take 1 tablet (20 mg total) by mouth daily. 30 tablet 0   fluticasone  (FLONASE ) 50 MCG/ACT nasal spray SHAKE LIQUID AND USE 2 SPRAYS IN EACH NOSTRIL DAILY 16 g 6   furosemide  (LASIX ) 20 MG tablet Take 1 tablet (20 mg total) by mouth daily. 90 tablet 3   gabapentin  (NEURONTIN ) 300 MG capsule Take 300 mg by mouth 3 (three) times daily.     Multiple Vitamins-Minerals (CENTRUM SILVER 50+MEN PO) Take 1 tablet by mouth daily.     Omega-3 Fatty Acids (FISH OIL) 1000 MG CPDR Take 1,000 mg by mouth daily.     ondansetron  (ZOFRAN ) 4 MG tablet Take 4 mg by mouth every 8 (eight) hours as needed for nausea or vomiting.  0   potassium chloride  SA (KLOR-CON  M) 20 MEQ tablet Take 1 tablet (20 mEq total) by mouth daily. 90 tablet 3   rosuvastatin  (CRESTOR ) 10 MG tablet Take 1 tablet (10 mg total) by mouth daily. 90 tablet 3   spironolactone  (ALDACTONE ) 25 MG tablet Take 1 tablet (25 mg total) by mouth daily. 90 tablet 3   traZODone  (DESYREL ) 50 MG tablet TAKE 1 TABLET(50 MG) BY MOUTH AT BEDTIME 30 tablet 2   triamcinolone  ointment (KENALOG ) 0.1 % Apply 1 Application topically every other day.     emtricitabine -rilpivir-tenofovir  AF (ODEFSEY ) 200-25-25 MG TABS tablet Take 1 tablet by mouth daily. Please take at least 6 hours apart from famotidine  90 tablet 1   TIVICAY  50 MG tablet TAKE 1 TABLET(50 MG) BY MOUTH DAILY 30 tablet 3   No facility-administered medications prior to visit.     Allergies  Allergen Reactions   Bactrim Rash   Sulfamethoxazole -Trimethoprim Rash    Social History   Tobacco Use   Smoking status: Never   Smokeless tobacco: Never  Vaping Use   Vaping status: Never Used   Substance Use Topics   Alcohol use: Yes    Alcohol/week: 4.0 standard drinks of alcohol    Types: 4 Cans of beer per week    Comment: occ beer   Drug use: Not Currently    Types: Marijuana    Comment: accepted condoms    Family History  Problem Relation Age of Onset   Lung cancer Mother    Colon cancer Other 50       Objective   Objective:   Vitals:   05/01/24 0915 05/01/24 0917 05/01/24 0949  BP:  (!) 143/101 (!) 154/98  Pulse:  82   Resp: 16 16   Temp:  98.1 F (36.7 C)   TempSrc:  Oral   SpO2:  96%   Weight: 174 lb 6.4  oz (79.1 kg)     Body mass index is 29.94 kg/m.  Physical Exam Vitals and nursing note reviewed.  Constitutional:      Appearance: Normal appearance.  Cardiovascular:     Rate and Rhythm: Normal rate and regular rhythm.     Pulses: Normal pulses.     Heart sounds: Normal heart sounds.  Pulmonary:     Effort: Pulmonary effort is normal.     Breath sounds: Normal breath sounds.  Musculoskeletal:        General: Normal range of motion.  Neurological:     Mental Status: He is alert and oriented to person, place, and time.  Psychiatric:        Mood and Affect: Mood normal.        Behavior: Behavior normal.        Thought Content: Thought content normal.        Judgment: Judgment normal.        Assessment & Plan:   Problem List Items Addressed This Visit       Digestive   Cirrhosis (HCC)     Other   Healthcare maintenance   Other Visit Diagnoses       Asymptomatic HIV infection, with no history of HIV-related illness (HCC)  (Chronic)   -  Primary   Relevant Medications   dolutegravir  (TIVICAY ) 50 MG tablet   emtricitabine -rilpivir-tenofovir  AF (ODEFSEY ) 200-25-25 MG TABS tablet       Assessment and Plan  -HIV (human immunodeficiency virus infection) Current ART regimen includes Odefsey  and Tivicay . Reports daily adherence and denies adverse effects. Last documented HIV-1 RNA not detected and CD4 1024. Labs reviewed and  Kidney function normal. Will update HIV-1 RNA and CD4 on next visit. Will refill medication today and continue with current regimen. Social determinants of health reviewed with no interventions indicated. Plan for follow up in 6 months or sooner if needed with lab work on the same day.   -Cirrhosis Is being followed by [facility] and last seen on 03/30/2024. He denies juandice, RUQ pain, and dark urine. Continue to follow [facility].    -Healthcare maintenance  He reports not being sexually active and declines STI screening at this time. Immunizations reviewed and are up to date. Preventive care reviewed and is up to date. He reports having a dental appointment tomorrow.      No orders of the defined types were placed in this encounter.   Meds ordered this encounter  Medications   dolutegravir  (TIVICAY ) 50 MG tablet    Sig: Take 1 tablet (50 mg total) by mouth daily.    Dispense:  90 tablet    Refill:  1    Fill with Odefsey     Supervising Provider:   LUIZ CHANNEL [phone]    Prescription Type::   Renewal   emtricitabine -rilpivir-tenofovir  AF (ODEFSEY ) 200-25-25 MG TABS tablet    Sig: Take 1 tablet by mouth daily. Please take at least 6 hours apart from famotidine     Dispense:  90 tablet    Refill:  1    Fill with Tivicay     Supervising Provider:   LUIZ CHANNEL [phone]    Prescription Type::   Renewal    Return in about 6 months (around 10/30/2024).   I have reviewed and agree with plan as above and adjusted note as appropriate.   Cathlyn July, NP 05/01/2024 10:47 AM

## 2024-05-17 ENCOUNTER — Ambulatory Visit: Payer: 59

## 2024-05-17 DIAGNOSIS — I428 Other cardiomyopathies: Secondary | ICD-10-CM | POA: Diagnosis not present

## 2024-05-18 LAB — CUP PACEART REMOTE DEVICE CHECK
Battery Remaining Longevity: 42 mo
Battery Voltage: 2.96 V
Brady Statistic AP VP Percent: 0.89 %
Brady Statistic AP VS Percent: 0.02 %
Brady Statistic AS VP Percent: 97.54 %
Brady Statistic AS VS Percent: 1.56 %
Brady Statistic RA Percent Paced: 0.9 %
Brady Statistic RV Percent Paced: 1.88 %
Date Time Interrogation Session: 20251217093426
HighPow Impedance: 85 Ohm
Implantable Lead Connection Status: 753985
Implantable Lead Connection Status: 753985
Implantable Lead Connection Status: 753985
Implantable Lead Implant Date: 20150126
Implantable Lead Implant Date: 20240617
Implantable Lead Implant Date: 20240617
Implantable Lead Location: 753859
Implantable Lead Location: 753860
Implantable Lead Location: 753860
Implantable Lead Model: 3830
Implantable Lead Model: 5076
Implantable Lead Model: 6935
Implantable Pulse Generator Implant Date: 20220826
Lead Channel Impedance Value: 247 Ohm
Lead Channel Impedance Value: 342 Ohm
Lead Channel Impedance Value: 380 Ohm
Lead Channel Impedance Value: 399 Ohm
Lead Channel Impedance Value: 513 Ohm
Lead Channel Impedance Value: 532 Ohm
Lead Channel Pacing Threshold Amplitude: 0.5 V
Lead Channel Pacing Threshold Amplitude: 0.5 V
Lead Channel Pacing Threshold Amplitude: 0.75 V
Lead Channel Pacing Threshold Pulse Width: 0.4 ms
Lead Channel Pacing Threshold Pulse Width: 0.4 ms
Lead Channel Pacing Threshold Pulse Width: 0.6 ms
Lead Channel Sensing Intrinsic Amplitude: 1.125 mV
Lead Channel Sensing Intrinsic Amplitude: 1.125 mV
Lead Channel Sensing Intrinsic Amplitude: 7.75 mV
Lead Channel Sensing Intrinsic Amplitude: 7.75 mV
Lead Channel Setting Pacing Amplitude: 1.5 V
Lead Channel Setting Pacing Amplitude: 2 V
Lead Channel Setting Pacing Amplitude: 2.5 V
Lead Channel Setting Pacing Pulse Width: 0.4 ms
Lead Channel Setting Pacing Pulse Width: 0.6 ms
Lead Channel Setting Sensing Sensitivity: 0.3 mV
Zone Setting Status: 755011
Zone Setting Status: 755011

## 2024-05-18 NOTE — Progress Notes (Signed)
 Remote ICD Transmission

## 2024-05-28 ENCOUNTER — Ambulatory Visit: Payer: Self-pay | Admitting: Internal Medicine

## 2024-06-23 ENCOUNTER — Other Ambulatory Visit: Payer: Self-pay

## 2024-06-27 MED ORDER — POTASSIUM CHLORIDE CRYS ER 20 MEQ PO TBCR: 20.0000 meq | EXTENDED_RELEASE_TABLET | Freq: Every day | ORAL | 3 refills | Status: AC

## 2024-07-06 ENCOUNTER — Other Ambulatory Visit (HOSPITAL_COMMUNITY): Payer: Self-pay

## 2024-07-06 ENCOUNTER — Telehealth (HOSPITAL_COMMUNITY): Payer: Self-pay

## 2024-07-06 MED ORDER — ROSUVASTATIN CALCIUM 10 MG PO TABS: 10.0000 mg | ORAL_TABLET | Freq: Every day | ORAL | 3 refills | Status: AC

## 2024-10-06 ENCOUNTER — Ambulatory Visit (HOSPITAL_COMMUNITY): Admitting: Internal Medicine

## 2024-11-07 ENCOUNTER — Ambulatory Visit: Admitting: Family
